# Patient Record
Sex: Female | Born: 1955 | Race: White | Hispanic: No | Smoking: Former smoker
Health system: Southern US, Community
[De-identification: ages and names within clinical notes are randomized; demographics above are authoritative.]

## PROBLEM LIST (undated history)

## (undated) DIAGNOSIS — H269 Unspecified cataract: Secondary | ICD-10-CM

## (undated) DIAGNOSIS — M1990 Unspecified osteoarthritis, unspecified site: Secondary | ICD-10-CM

## (undated) DIAGNOSIS — Z9889 Other specified postprocedural states: Secondary | ICD-10-CM

## (undated) DIAGNOSIS — K259 Gastric ulcer, unspecified as acute or chronic, without hemorrhage or perforation: Secondary | ICD-10-CM

## (undated) DIAGNOSIS — E119 Type 2 diabetes mellitus without complications: Secondary | ICD-10-CM

## (undated) DIAGNOSIS — I700 Atherosclerosis of aorta: Secondary | ICD-10-CM

## (undated) DIAGNOSIS — I10 Essential (primary) hypertension: Secondary | ICD-10-CM

## (undated) DIAGNOSIS — E785 Hyperlipidemia, unspecified: Secondary | ICD-10-CM

## (undated) DIAGNOSIS — T7840XA Allergy, unspecified, initial encounter: Secondary | ICD-10-CM

## (undated) DIAGNOSIS — Z8489 Family history of other specified conditions: Secondary | ICD-10-CM

## (undated) DIAGNOSIS — R519 Headache, unspecified: Secondary | ICD-10-CM

## (undated) DIAGNOSIS — R112 Nausea with vomiting, unspecified: Secondary | ICD-10-CM

## (undated) DIAGNOSIS — F419 Anxiety disorder, unspecified: Secondary | ICD-10-CM

## (undated) DIAGNOSIS — J189 Pneumonia, unspecified organism: Secondary | ICD-10-CM

## (undated) DIAGNOSIS — K219 Gastro-esophageal reflux disease without esophagitis: Secondary | ICD-10-CM

## (undated) DIAGNOSIS — R51 Headache: Secondary | ICD-10-CM

## (undated) HISTORY — PX: EYE SURGERY: SHX253

## (undated) HISTORY — PX: APPENDECTOMY: SHX54

## (undated) HISTORY — DX: Allergy, unspecified, initial encounter: T78.40XA

## (undated) HISTORY — DX: Hyperlipidemia, unspecified: E78.5

## (undated) HISTORY — PX: FRACTURE SURGERY: SHX138

## (undated) HISTORY — DX: Unspecified cataract: H26.9

## (undated) HISTORY — PX: OTHER SURGICAL HISTORY: SHX169

## (undated) HISTORY — PX: VESICO-VAGINAL FISTULA REPAIR: SHX5129

## (undated) HISTORY — PX: TUBAL LIGATION: SHX77

---

## 1998-09-08 ENCOUNTER — Other Ambulatory Visit: Admission: RE | Admit: 1998-09-08 | Discharge: 1998-09-08 | Payer: Self-pay | Admitting: *Deleted

## 2000-06-30 ENCOUNTER — Encounter: Payer: Self-pay | Admitting: Internal Medicine

## 2000-06-30 ENCOUNTER — Encounter: Admission: RE | Admit: 2000-06-30 | Discharge: 2000-06-30 | Payer: Self-pay | Admitting: Internal Medicine

## 2001-07-30 ENCOUNTER — Encounter: Admission: RE | Admit: 2001-07-30 | Discharge: 2001-07-30 | Payer: Self-pay | Admitting: Internal Medicine

## 2001-07-30 ENCOUNTER — Encounter: Payer: Self-pay | Admitting: Internal Medicine

## 2002-07-23 ENCOUNTER — Encounter: Admission: RE | Admit: 2002-07-23 | Discharge: 2002-07-23 | Payer: Self-pay | Admitting: Internal Medicine

## 2002-07-23 ENCOUNTER — Encounter: Payer: Self-pay | Admitting: Internal Medicine

## 2003-09-16 ENCOUNTER — Other Ambulatory Visit: Admission: RE | Admit: 2003-09-16 | Discharge: 2003-09-16 | Payer: Self-pay | Admitting: Internal Medicine

## 2003-10-21 ENCOUNTER — Encounter: Admission: RE | Admit: 2003-10-21 | Discharge: 2003-10-21 | Payer: Self-pay | Admitting: Internal Medicine

## 2004-10-19 ENCOUNTER — Other Ambulatory Visit: Admission: RE | Admit: 2004-10-19 | Discharge: 2004-10-19 | Payer: Self-pay | Admitting: Internal Medicine

## 2004-11-25 ENCOUNTER — Encounter: Admission: RE | Admit: 2004-11-25 | Discharge: 2004-11-25 | Payer: Self-pay | Admitting: Internal Medicine

## 2005-06-12 ENCOUNTER — Encounter: Admission: RE | Admit: 2005-06-12 | Discharge: 2005-06-12 | Payer: Self-pay | Admitting: Orthopedic Surgery

## 2005-08-04 ENCOUNTER — Ambulatory Visit (HOSPITAL_COMMUNITY): Admission: RE | Admit: 2005-08-04 | Discharge: 2005-08-05 | Payer: Self-pay | Admitting: Orthopedic Surgery

## 2005-12-20 ENCOUNTER — Encounter: Admission: RE | Admit: 2005-12-20 | Discharge: 2005-12-20 | Payer: Self-pay | Admitting: Internal Medicine

## 2006-07-31 ENCOUNTER — Encounter: Admission: RE | Admit: 2006-07-31 | Discharge: 2006-07-31 | Payer: Self-pay | Admitting: Internal Medicine

## 2006-08-28 ENCOUNTER — Encounter: Admission: RE | Admit: 2006-08-28 | Discharge: 2006-08-28 | Payer: Self-pay | Admitting: Internal Medicine

## 2006-11-16 ENCOUNTER — Other Ambulatory Visit: Admission: RE | Admit: 2006-11-16 | Discharge: 2006-11-16 | Payer: Self-pay | Admitting: Internal Medicine

## 2007-01-09 ENCOUNTER — Encounter: Admission: RE | Admit: 2007-01-09 | Discharge: 2007-01-09 | Payer: Self-pay | Admitting: Internal Medicine

## 2008-01-10 ENCOUNTER — Encounter: Admission: RE | Admit: 2008-01-10 | Discharge: 2008-01-10 | Payer: Self-pay | Admitting: Internal Medicine

## 2008-03-20 ENCOUNTER — Encounter: Admission: RE | Admit: 2008-03-20 | Discharge: 2008-03-20 | Payer: Self-pay | Admitting: Orthopedic Surgery

## 2008-04-23 ENCOUNTER — Ambulatory Visit (HOSPITAL_COMMUNITY): Admission: RE | Admit: 2008-04-23 | Discharge: 2008-04-24 | Payer: Self-pay | Admitting: Orthopedic Surgery

## 2008-12-18 ENCOUNTER — Other Ambulatory Visit: Admission: RE | Admit: 2008-12-18 | Discharge: 2008-12-18 | Payer: Self-pay | Admitting: Internal Medicine

## 2008-12-23 ENCOUNTER — Encounter: Admission: RE | Admit: 2008-12-23 | Discharge: 2008-12-23 | Payer: Self-pay | Admitting: Internal Medicine

## 2009-01-16 ENCOUNTER — Encounter: Admission: RE | Admit: 2009-01-16 | Discharge: 2009-01-16 | Payer: Self-pay | Admitting: Internal Medicine

## 2010-01-18 ENCOUNTER — Encounter: Admission: RE | Admit: 2010-01-18 | Discharge: 2010-01-18 | Payer: Self-pay | Admitting: Internal Medicine

## 2010-05-24 ENCOUNTER — Encounter: Payer: Self-pay | Admitting: Internal Medicine

## 2010-09-14 NOTE — Op Note (Signed)
Veronica Galvan, Veronica Galvan              ACCOUNT NO.:  [id number]   MEDICAL RECORD NO.:  [id number]          PATIENT TYPE:  OIB   LOCATION:  1529                         FACILITY:  [facility]   PHYSICIAN:  Marlowe Kays, M.D.  DATE OF BIRTH:  March 19, 1956   DATE OF PROCEDURE:  04/23/2008  DATE OF DISCHARGE:                               OPERATIVE REPORT   PREOPERATIVE DIAGNOSES:  1. Degenerative arthritis, acromioclavicular joint.  2. Chronic impingement syndrome with probable full-thickness rotator      cuff tear.   POSTOPERATIVE DIAGNOSES:  1. Degenerative arthritis, acromioclavicular joint.  2. Full-thickness rotator cuff tear, left shoulder.   OPERATION:  1. Left shoulder arthroscopy with essentially normal glenohumeral      examination.  2. Attempted arthroscopic subacromial decompression.  3. Open distal clavicle resection.  4. Open anterior acromionectomy and repair of torn rotator cuff, left      shoulder.   SURGEON:  Marlowe Kays, M.D.   ASSISTANTDruscilla Brownie. Idolina Primer, P.A.-C   ANESTHESIA:  General.   PATHOLOGY AND JUSTIFICATION FOR PROCEDURE:  She has had prior right  shoulder surgery with open distal clavicle resection and arthroscopic  subacromial decompression which has done well.  This was back in 2007.  More recently she has developed progressive pain in her left shoulder  with an MRI demonstrating considerable AC joint arthritis, and what was  felt to be most probably a full-thickness rotator cuff tear of the  distal rotator cuff.  Plan today was to arthroscope her left shoulder  with arthroscopic subacromial decompression, open distal clavicle  resection, and possible mini-open rotator cuff repair.   PROCEDURE:  Prophylactic antibiotics, satisfactory general anesthesia,  beach chair position on the [place] frame, left shoulder girdle was  prepped with DuraPrep, draped in a sterile field.  The anatomy of the  shoulder joint was marked out and time-out performed.   Because of what  the patient felt was an allergy to EPINEPHRINE, which was probably more  of a physiologic response, it was elected not to utilize adrenaline in  the Marcaine which I normally use for hemostatic purposes.  I injected  lateral and posterior portal sites subacromial space and area for the  distal clavicle excision with the 0.5% plain Marcaine.  Through a  posterior soft spot portal I atraumatically entered the glenohumeral  joint which was normal on examination.  Representative pictures were  taken.  The pictures were a little cloudy because of some mild bleeding.  I then redirected the scope into the subacromial space anterolateral  portal, introduced a 4.2 shaver.  Bleeding now became a problem, even  raising the arthroscopic inflow pressure from 65 to 80 mmHg.  Because of  inability to see the instrumentation, I finally had to abandon the  arthroscopic approach.  I then made an incision over the distal  clavicle, identifying the [facility] joint, and measuring 1.5 cm from its  lateral extent.  I then undermined the clavicle at this point and  protecting the underlying structures with this baby Hohmann, I used a  micro-saw to cut the clavicle and removed with towel clip  and cautery  technique.  Several small little spicules on the cut side of clavicle I  then removed also with a small rongeur and covered the raw bone with  bone wax.  I then extended the incision lateralward and with  subperiosteal dissection exposed the distal acromion which I undermined  with a small Cobb elevator, followed by a larger Cobb elevator.  I then  made my initial anterior acromionectomy with a micro-saw.  She had very  thick acromion and marked impingement, and I then continued the  decompression with saw, rasp and small rongeur until we had a nice wide  decompression.  I then inspected the rotator cuff and she indeed did  have a substantial full-thickness tear measuring roughly 1.5 cm in  width, with  about 0.75 cm  of retraction.  After identifying this, I  then used a 4-stranded Stryker 5.5 anchor in the lateral humerus and  wove it symmetrically through the terminal end of the rotator cuff tear,  and just  brought the torn rotator cuff back to its anatomic position.  I then made additional sutures through the terminal portion of the cuff  and the lateral humeral soft tissue __________ the bursa, supplementing  the repair and also smoothing down the terminal end.  I took pictures of  the tear and the repair.  The wound was then irrigated well with sterile  saline and I placed Gelfoam in the distal clavicle resection site and  closed the wound over the top of it with interrupted #1 Vicryl in the  small split in deltoid muscle and fascia as well as the deep  subcutaneous tissue, 2-0 Vicryl in the superficial subcutaneous tissue,  and Steri-Strips on the skin, with 4-0 nylon in the 2 portal sites.  Betadine and Adaptic was applied to them followed by dry sterile  dressing and shoulder immobilizer.  She tolerated the procedure well and  was taken to the recovery room in satisfactory condition with no known  complications.           ______________________________  Marlowe Kays, M.D.     JA/MEDQ  D:  04/23/2008  T:  04/24/2008  Job:  [id number]

## 2010-09-17 NOTE — Op Note (Signed)
NAMECAYLAN, Veronica Galvan              ACCOUNT NO.:  [id number]   MEDICAL RECORD NO.:  [id number]          PATIENT TYPE:  AMB   LOCATION:  DAY                          FACILITY:  [facility]   PHYSICIAN:  Marlowe Kays, M.D.  DATE OF BIRTH:  Mar 20, 1956   DATE OF PROCEDURE:  DATE OF DISCHARGE:                                 OPERATIVE REPORT   PREOPERATIVE DIAGNOSES:  1.  Chronic impingement syndrome with rotator cuff tendinopathy.  2.  Degenerative arthritis AC joint right shoulder.   POSTOP DIAGNOSIS:  1.  Chronic impingement syndrome with rotator cuff tendinopathy.  2.  Degenerative arthritis AC joint right shoulder.   OPERATION:  1.  Right shoulder arthroscopy (normal examination).  2.  Arthroscopic subacromial decompression with shaving of rotator cuff.  3.  Open resection distal clavicle, right shoulder.   SURGEON:  Marlowe Kays, M.D.   ASSISTANTDruscilla Brownie. Underwood, P.A.-C.   ANESTHESIA:  General.   PATHOLOGY AND JUSTIFICATION FOR PROCEDURE:  Painful right shoulder with  tenderness of the AC joint.  MRI demonstrates partial rotator cuff tear with  chronic impingement syndrome type picture.  Accordingly, she is here for  above-mentioned surgical procedure.   PROCEDURE:  Prophylactic antibiotic, satisfactory general anesthesia, beach-  chair position on the [place] frame.  Right shoulder girdle was prepped with  DuraPrep and draped sterile field.  Anatomy of shoulder joint was marked  out.  She has been very reactive to epinephrine so no epinephrine was used  throughout the case, either with local injection of Marcaine for in the  irrigation bottles.  I injected the lateral and posterior portal sites and  the incision area for the distal clavicle resection with 1/2% plain Marcaine  and after prepping the right shoulder with DuraPrep and draped in sterile  field.  Through posterior soft spot portal I atraumatically entered  glenohumeral joint which was normal on examination.   I then redirected the  scope to the subacromial space and through a lateral portal introduced a  blunt trocar followed by 4.2 shaver.  She had a very exuberant subdeltoid  bursitis which I resected.  She had also significant impingement problem.  Mr. Idolina Primer had to pull down on the arm for a good bit of the initial  procedure to allow appropriate access through the lateral portal.  I first  used the 4.2 shaver to remove a good bit of bursal followed by the 90 degree  Arthro-Care vaporizer resecting most the soft tissue from the undersurface  of acromion and then used a 4 mm oval bur to begin burring down the  undersurface of the distal acromion and went back-and-forth between the  three instruments until we had wide subacromial decompression.  The rotator  cuff was irregular on its bursal surface.  I shaved this down as well, I  smoothed this down as well.  There was no frank significant tear.  Bleeders  were coagulated before winding up this portion procedure with the Arthro-  Care vaporizer.  I then removed all fluid possible from the subacromial  space and made an open incision on the distal  clavicle and with  subperiosteal dissection exposed the [facility] joint and the distal clavicle.  I  marked out a centimeter and a half from the [facility] joint and placed some baby  Homan retractors beneath the clavicle at this point which I marked with  cautery and then used a micro saw to cut the clavicle and then remove it  with a towel clip and cutting cautery technique.  Few minor spurs on the  undersurface of the cut surface of the clavicle I removed with small  rongeur, I then covered the raw surface with bone wax. I then placed Gelfoam  in the resection site and closed the wound with interrupted #1 Vicryl in the  fascial layer over the clavicle and resection site and deep subcu tissue, 2-  0 Vicryl superficial subcu tissue, Steri-Strips on the skin and 4-0 nylon in  the two portals.  Dry sterile  dressing and shoulder immobilizer applied.  She tolerated the procedure, was taken to recovery in satisfactory condition  with no complications.           ______________________________  Marlowe Kays, M.D.     JA/MEDQ  D:  08/04/2005  T:  08/05/2005  Job:  [id number]

## 2010-12-15 ENCOUNTER — Other Ambulatory Visit: Payer: Self-pay | Admitting: Internal Medicine

## 2010-12-15 DIAGNOSIS — Z1231 Encounter for screening mammogram for malignant neoplasm of breast: Secondary | ICD-10-CM

## 2011-01-21 ENCOUNTER — Ambulatory Visit
Admission: RE | Admit: 2011-01-21 | Discharge: 2011-01-21 | Disposition: A | Discharge status: Home / Self Care | Payer: Commercial Indemnity | Source: Ambulatory Visit | Attending: Internal Medicine | Admitting: Internal Medicine

## 2011-01-21 DIAGNOSIS — Z1231 Encounter for screening mammogram for malignant neoplasm of breast: Secondary | ICD-10-CM

## 2011-02-04 LAB — COMPREHENSIVE METABOLIC PANEL
ALT: 83 U/L — ABNORMAL HIGH (ref 0–35)
AST: 53 U/L — ABNORMAL HIGH (ref 0–37)
Alkaline Phosphatase: 58 U/L (ref 39–117)
BUN: 13 mg/dL (ref 6–23)
GFR calc Af Amer: >60 mL/min (ref >60)
GFR calc non Af Amer: >60 mL/min (ref >60)
Potassium: 3.8 mEq/L (ref 3.5–5.1)
Total Bilirubin: 0.8 mg/dL (ref 0.3–1.2)
Total Protein: 6.7 g/dL (ref 6.0–8.3)

## 2011-02-04 LAB — GLUCOSE, CAPILLARY
Glucose-Capillary: 163 mg/dL — ABNORMAL HIGH (ref 70–99)
Glucose-Capillary: 236 mg/dL — ABNORMAL HIGH (ref 70–99)

## 2011-06-03 HISTORY — PX: OTHER SURGICAL HISTORY: SHX169

## 2011-12-27 ENCOUNTER — Other Ambulatory Visit: Payer: Self-pay | Admitting: Internal Medicine

## 2011-12-27 DIAGNOSIS — Z1231 Encounter for screening mammogram for malignant neoplasm of breast: Secondary | ICD-10-CM

## 2012-01-23 ENCOUNTER — Ambulatory Visit
Admission: RE | Admit: 2012-01-23 | Discharge: 2012-01-23 | Disposition: A | Discharge status: Home / Self Care | Payer: Commercial Indemnity | Source: Ambulatory Visit | Attending: Internal Medicine | Admitting: Internal Medicine

## 2012-01-23 ENCOUNTER — Ambulatory Visit: Payer: Commercial Indemnity

## 2012-01-23 DIAGNOSIS — Z1231 Encounter for screening mammogram for malignant neoplasm of breast: Secondary | ICD-10-CM

## 2012-03-26 ENCOUNTER — Other Ambulatory Visit: Payer: Self-pay | Admitting: Internal Medicine

## 2012-03-26 ENCOUNTER — Other Ambulatory Visit (HOSPITAL_COMMUNITY)
Admission: RE | Admit: 2012-03-26 | Discharge: 2012-03-26 | Disposition: A | Discharge status: Home / Self Care | Payer: Commercial Indemnity | Source: Ambulatory Visit | Attending: Internal Medicine | Admitting: Internal Medicine

## 2012-03-26 DIAGNOSIS — Z01419 Encounter for gynecological examination (general) (routine) without abnormal findings: Secondary | ICD-10-CM | POA: Insufficient documentation

## 2012-03-26 DIAGNOSIS — Z1151 Encounter for screening for human papillomavirus (HPV): Secondary | ICD-10-CM | POA: Insufficient documentation

## 2013-01-08 ENCOUNTER — Other Ambulatory Visit: Payer: Self-pay

## 2013-01-08 DIAGNOSIS — Z1231 Encounter for screening mammogram for malignant neoplasm of breast: Secondary | ICD-10-CM

## 2013-01-28 ENCOUNTER — Ambulatory Visit
Admission: RE | Admit: 2013-01-28 | Discharge: 2013-01-28 | Disposition: A | Discharge status: Home / Self Care | Payer: Commercial Indemnity | Source: Ambulatory Visit

## 2013-01-28 DIAGNOSIS — Z1231 Encounter for screening mammogram for malignant neoplasm of breast: Secondary | ICD-10-CM

## 2013-06-10 ENCOUNTER — Other Ambulatory Visit: Payer: Self-pay | Admitting: Internal Medicine

## 2013-06-10 ENCOUNTER — Ambulatory Visit
Admission: RE | Admit: 2013-06-10 | Discharge: 2013-06-10 | Disposition: A | Discharge status: Home / Self Care | Payer: Managed Care, Other (non HMO) | Source: Ambulatory Visit | Attending: Internal Medicine | Admitting: Internal Medicine

## 2013-06-10 DIAGNOSIS — S0003XA Contusion of scalp, initial encounter: Secondary | ICD-10-CM

## 2013-06-10 DIAGNOSIS — S0083XA Contusion of other part of head, initial encounter: Secondary | ICD-10-CM

## 2013-06-10 DIAGNOSIS — IMO0002 Reserved for concepts with insufficient information to code with codable children: Secondary | ICD-10-CM

## 2013-06-10 DIAGNOSIS — S1093XA Contusion of unspecified part of neck, initial encounter: Principal | ICD-10-CM

## 2013-12-23 ENCOUNTER — Other Ambulatory Visit: Payer: Self-pay

## 2013-12-23 DIAGNOSIS — Z1231 Encounter for screening mammogram for malignant neoplasm of breast: Secondary | ICD-10-CM

## 2014-02-05 ENCOUNTER — Ambulatory Visit
Admission: RE | Admit: 2014-02-05 | Discharge: 2014-02-05 | Disposition: A | Discharge status: Home / Self Care | Payer: Commercial Indemnity | Source: Ambulatory Visit

## 2014-02-05 ENCOUNTER — Encounter (INDEPENDENT_AMBULATORY_CARE_PROVIDER_SITE_OTHER): Payer: Self-pay

## 2014-02-05 DIAGNOSIS — Z1231 Encounter for screening mammogram for malignant neoplasm of breast: Secondary | ICD-10-CM

## 2014-06-23 ENCOUNTER — Other Ambulatory Visit (HOSPITAL_COMMUNITY)
Admission: RE | Admit: 2014-06-23 | Discharge: 2014-06-23 | Disposition: A | Discharge status: Home / Self Care | Payer: BLUE CROSS/BLUE SHIELD | Source: Ambulatory Visit | Attending: Obstetrics and Gynecology | Admitting: Obstetrics and Gynecology

## 2014-06-23 ENCOUNTER — Other Ambulatory Visit: Payer: Self-pay | Admitting: Obstetrics and Gynecology

## 2014-06-23 DIAGNOSIS — Z01419 Encounter for gynecological examination (general) (routine) without abnormal findings: Secondary | ICD-10-CM | POA: Diagnosis not present

## 2014-06-25 LAB — CYTOLOGY - PAP

## 2015-02-19 ENCOUNTER — Other Ambulatory Visit: Payer: Self-pay

## 2015-02-19 DIAGNOSIS — Z1231 Encounter for screening mammogram for malignant neoplasm of breast: Secondary | ICD-10-CM

## 2015-02-24 ENCOUNTER — Ambulatory Visit
Admission: RE | Admit: 2015-02-24 | Discharge: 2015-02-24 | Disposition: A | Discharge status: Home / Self Care | Payer: BLUE CROSS/BLUE SHIELD | Source: Ambulatory Visit

## 2015-02-24 DIAGNOSIS — Z1231 Encounter for screening mammogram for malignant neoplasm of breast: Secondary | ICD-10-CM

## 2015-04-02 ENCOUNTER — Other Ambulatory Visit: Payer: Self-pay | Admitting: Internal Medicine

## 2015-04-02 DIAGNOSIS — R109 Unspecified abdominal pain: Secondary | ICD-10-CM

## 2015-04-08 ENCOUNTER — Ambulatory Visit
Admission: RE | Admit: 2015-04-08 | Discharge: 2015-04-08 | Disposition: A | Discharge status: Home / Self Care | Payer: BLUE CROSS/BLUE SHIELD | Source: Ambulatory Visit | Attending: Internal Medicine | Admitting: Internal Medicine

## 2015-04-08 DIAGNOSIS — R109 Unspecified abdominal pain: Secondary | ICD-10-CM

## 2015-06-17 ENCOUNTER — Other Ambulatory Visit: Payer: Self-pay | Admitting: Obstetrics and Gynecology

## 2015-06-17 ENCOUNTER — Other Ambulatory Visit (HOSPITAL_COMMUNITY)
Admission: RE | Admit: 2015-06-17 | Discharge: 2015-06-17 | Disposition: A | Discharge status: Home / Self Care | Payer: BLUE CROSS/BLUE SHIELD | Source: Ambulatory Visit | Attending: Obstetrics and Gynecology | Admitting: Obstetrics and Gynecology

## 2015-06-17 DIAGNOSIS — Z01411 Encounter for gynecological examination (general) (routine) with abnormal findings: Secondary | ICD-10-CM | POA: Insufficient documentation

## 2015-06-17 DIAGNOSIS — Z1151 Encounter for screening for human papillomavirus (HPV): Secondary | ICD-10-CM | POA: Insufficient documentation

## 2015-06-19 LAB — CYTOLOGY - PAP

## 2016-03-07 ENCOUNTER — Other Ambulatory Visit: Payer: Self-pay | Admitting: Obstetrics and Gynecology

## 2016-03-07 DIAGNOSIS — Z1231 Encounter for screening mammogram for malignant neoplasm of breast: Secondary | ICD-10-CM

## 2016-03-21 ENCOUNTER — Ambulatory Visit
Admission: RE | Admit: 2016-03-21 | Discharge: 2016-03-21 | Disposition: A | Discharge status: Home / Self Care | Payer: BLUE CROSS/BLUE SHIELD | Source: Ambulatory Visit | Attending: Obstetrics and Gynecology | Admitting: Obstetrics and Gynecology

## 2016-03-21 DIAGNOSIS — Z1231 Encounter for screening mammogram for malignant neoplasm of breast: Secondary | ICD-10-CM

## 2016-07-01 ENCOUNTER — Other Ambulatory Visit: Payer: Self-pay | Admitting: Gastroenterology

## 2016-08-25 ENCOUNTER — Encounter (HOSPITAL_COMMUNITY): Payer: Self-pay | Admitting: *Deleted

## 2016-08-29 ENCOUNTER — Encounter (HOSPITAL_COMMUNITY): Payer: Self-pay | Admitting: Certified Registered Nurse Anesthetist

## 2016-08-29 ENCOUNTER — Ambulatory Visit (HOSPITAL_COMMUNITY)
Admission: RE | Admit: 2016-08-29 | Payer: BLUE CROSS/BLUE SHIELD | Source: Ambulatory Visit | Admitting: Gastroenterology

## 2016-08-29 HISTORY — DX: Unspecified osteoarthritis, unspecified site: M19.90

## 2016-08-29 HISTORY — DX: Headache, unspecified: R51.9

## 2016-08-29 HISTORY — DX: Anxiety disorder, unspecified: F41.9

## 2016-08-29 HISTORY — DX: Gastro-esophageal reflux disease without esophagitis: K21.9

## 2016-08-29 HISTORY — DX: Essential (primary) hypertension: I10

## 2016-08-29 HISTORY — DX: Other specified postprocedural states: Z98.890

## 2016-08-29 HISTORY — DX: Headache: R51

## 2016-08-29 HISTORY — DX: Gastric ulcer, unspecified as acute or chronic, without hemorrhage or perforation: K25.9

## 2016-08-29 HISTORY — DX: Family history of other specified conditions: Z84.89

## 2016-08-29 HISTORY — DX: Type 2 diabetes mellitus without complications: E11.9

## 2016-08-29 HISTORY — DX: Nausea with vomiting, unspecified: R11.2

## 2016-08-29 SURGERY — COLONOSCOPY WITH PROPOFOL
Anesthesia: Monitor Anesthesia Care

## 2016-08-30 ENCOUNTER — Other Ambulatory Visit: Payer: Self-pay | Admitting: Gastroenterology

## 2016-09-07 ENCOUNTER — Ambulatory Visit (HOSPITAL_COMMUNITY): Payer: BLUE CROSS/BLUE SHIELD | Admitting: Anesthesiology

## 2016-09-07 ENCOUNTER — Encounter (HOSPITAL_COMMUNITY): Payer: Self-pay | Admitting: *Deleted

## 2016-09-07 ENCOUNTER — Ambulatory Visit (HOSPITAL_COMMUNITY)
Admission: RE | Admit: 2016-09-07 | Discharge: 2016-09-07 | Disposition: A | Discharge status: Home / Self Care | Payer: BLUE CROSS/BLUE SHIELD | Source: Ambulatory Visit | Attending: Gastroenterology | Admitting: Gastroenterology

## 2016-09-07 ENCOUNTER — Encounter (HOSPITAL_COMMUNITY)
Admission: RE | Disposition: A | Discharge status: Home / Self Care | Payer: Self-pay | Source: Ambulatory Visit | Attending: Gastroenterology

## 2016-09-07 DIAGNOSIS — Z885 Allergy status to narcotic agent status: Secondary | ICD-10-CM | POA: Diagnosis not present

## 2016-09-07 DIAGNOSIS — I10 Essential (primary) hypertension: Secondary | ICD-10-CM | POA: Diagnosis not present

## 2016-09-07 DIAGNOSIS — D128 Benign neoplasm of rectum: Secondary | ICD-10-CM | POA: Diagnosis not present

## 2016-09-07 DIAGNOSIS — Z86010 Personal history of colonic polyps: Secondary | ICD-10-CM | POA: Diagnosis not present

## 2016-09-07 DIAGNOSIS — K279 Peptic ulcer, site unspecified, unspecified as acute or chronic, without hemorrhage or perforation: Secondary | ICD-10-CM | POA: Insufficient documentation

## 2016-09-07 DIAGNOSIS — M1990 Unspecified osteoarthritis, unspecified site: Secondary | ICD-10-CM | POA: Insufficient documentation

## 2016-09-07 DIAGNOSIS — E119 Type 2 diabetes mellitus without complications: Secondary | ICD-10-CM | POA: Diagnosis not present

## 2016-09-07 DIAGNOSIS — K219 Gastro-esophageal reflux disease without esophagitis: Secondary | ICD-10-CM | POA: Insufficient documentation

## 2016-09-07 DIAGNOSIS — H8110 Benign paroxysmal vertigo, unspecified ear: Secondary | ICD-10-CM | POA: Insufficient documentation

## 2016-09-07 DIAGNOSIS — Z880 Allergy status to penicillin: Secondary | ICD-10-CM | POA: Diagnosis not present

## 2016-09-07 DIAGNOSIS — Z1211 Encounter for screening for malignant neoplasm of colon: Secondary | ICD-10-CM | POA: Insufficient documentation

## 2016-09-07 HISTORY — PX: COLONOSCOPY WITH PROPOFOL: SHX5780

## 2016-09-07 LAB — GLUCOSE, CAPILLARY: Glucose-Capillary: 141 mg/dL — ABNORMAL HIGH (ref 65–99)

## 2016-09-07 LAB — HM COLONOSCOPY

## 2016-09-07 SURGERY — COLONOSCOPY WITH PROPOFOL
Anesthesia: Monitor Anesthesia Care

## 2016-09-07 MED ORDER — LACTATED RINGERS IV SOLN
INTRAVENOUS | Status: DC
Start: 2016-09-07 — End: 2016-09-07
  Administered 2016-09-07: 1000 mL via INTRAVENOUS

## 2016-09-07 MED ORDER — PROPOFOL 500 MG/50ML IV EMUL
INTRAVENOUS | Status: DC | PRN
  Administered 2016-09-07: 140 ug/kg/min via INTRAVENOUS

## 2016-09-07 MED ORDER — LIDOCAINE 2% (20 MG/ML) 5 ML SYRINGE
INTRAMUSCULAR | Status: DC | PRN
  Administered 2016-09-07: 100 mg via INTRAVENOUS

## 2016-09-07 MED ORDER — ONDANSETRON HCL 4 MG/2ML IJ SOLN
INTRAMUSCULAR | Status: DC | PRN
  Administered 2016-09-07: 4 mg via INTRAVENOUS

## 2016-09-07 MED ORDER — ONDANSETRON HCL 4 MG/2ML IJ SOLN
INTRAMUSCULAR | Status: AC
  Filled 2016-09-07: qty 2

## 2016-09-07 MED ORDER — PROPOFOL 10 MG/ML IV BOLUS
INTRAVENOUS | Status: AC
  Filled 2016-09-07: qty 60

## 2016-09-07 MED ORDER — SODIUM CHLORIDE 0.9 % IV SOLN: INTRAVENOUS | Status: DC

## 2016-09-07 MED ORDER — PROPOFOL 10 MG/ML IV BOLUS
INTRAVENOUS | Status: DC | PRN
  Administered 2016-09-07 (×4): 20 mg via INTRAVENOUS

## 2016-09-07 SURGICAL SUPPLY — 22 items

## 2016-09-07 NOTE — Anesthesia Preprocedure Evaluation (Addendum)
Anesthesia Evaluation  Patient identified by MRN, date of birth, ID band Patient awake  General Assessment Comment:Son has hx of MH  History of Anesthesia Complications (+) PONV and DIFFICULT IV STICK / SPECIAL LINE  Airway Mallampati: II       Dental  (+) Teeth Intact   Pulmonary former smoker,    breath sounds clear to auscultation       Cardiovascular hypertension,  Rhythm:Regular Rate:Normal     Neuro/Psych    GI/Hepatic PUD, GERD  ,  Endo/Other  diabetes  Renal/GU      Musculoskeletal  (+) Arthritis ,   Abdominal   Peds  Hematology   Anesthesia Other Findings   Reproductive/Obstetrics                            Anesthesia Physical Anesthesia Plan  ASA: III  Anesthesia Plan: MAC   Post-op Pain Management:    Induction: Intravenous  Airway Management Planned: Natural Airway and Simple Face Mask  Additional Equipment:   Intra-op Plan:   Post-operative Plan: Extubation in OR  Informed Consent: I have reviewed the patients History and Physical, chart, labs and discussed the procedure including the risks, benefits and alternatives for the proposed anesthesia with the patient or authorized representative who has indicated his/her understanding and acceptance.     Plan Discussed with:   Anesthesia Plan Comments:        Anesthesia Quick Evaluation

## 2016-09-07 NOTE — Op Note (Signed)
[facility] Patient Name: Veronica Galvan Procedure Date: 09/07/2016 MRN: [id number] Attending MD: Garlan Fair , MD Date of Birth: 23-Sep-1955 CSN: [id number] Age: 61 Admit Type: Outpatient Procedure:                Colonoscopy Indications:              High risk colon cancer surveillance: Personal                            history of non-advanced adenoma Providers:                Garlan Fair, MD, Lillie Fragmin, RN, Burtis Junes, RN, Cherylynn Ridges, Technician, Alan Mulder,                            Technician, Danley Danker, CRNA Referring MD:              Medicines:                Propofol per Anesthesia Complications:            No immediate complications. Estimated Blood Loss:     Estimated blood loss was minimal. Procedure:                Pre-Anesthesia Assessment:                           - Prior to the procedure, a History and Physical                            was performed, and patient medications and                            allergies were reviewed. The patient's tolerance of                            previous anesthesia was also reviewed. The risks                            and benefits of the procedure and the sedation                            options and risks were discussed with the patient.                            All questions were answered, and informed consent                            was obtained. Prior Anticoagulants: The patient has                            taken aspirin, last dose was 1 day prior to  procedure. ASA Grade Assessment: II - A patient                            with mild systemic disease. After reviewing the                            risks and benefits, the patient was deemed in                            satisfactory condition to undergo the procedure.                           After obtaining informed consent, the colonoscope   was passed under direct vision. Throughout the                            procedure, the patient's blood pressure, pulse, and                            oxygen saturations were monitored continuously. The                            EC-[id number] ([id number]) scope was introduced through                            the anus and advanced to the the cecum, identified                            by appendiceal orifice and ileocecal valve. The                            colonoscopy was performed without difficulty. The                            patient tolerated the procedure well. The quality                            of the bowel preparation was good. The ileocecal                            valve, the appendiceal orifice and the rectum were                            photographed. Scope In: 10:56:10 AM Scope Out: 11:11:46 AM Scope Withdrawal Time: 0 hours 11 minutes 15 seconds  Total Procedure Duration: 0 hours 15 minutes 36 seconds  Findings:      The perianal and digital rectal examinations were normal.      A 5 mm polyp was found in the rectum. The polyp was sessile. The polyp       was removed with a cold snare. Resection and retrieval were complete.      The exam was otherwise without abnormality. Impression:               - One 5 mm polyp in the rectum, removed  with a cold                            snare. Resected and retrieved.                           - The examination was otherwise normal. Moderate Sedation:      N/A- Per Anesthesia Care Recommendation:           - Patient has a contact number available for                            emergencies. The signs and symptoms of potential                            delayed complications were discussed with the                            patient. Return to normal activities tomorrow.                            Written discharge instructions were provided to the                            patient.                           - Repeat  colonoscopy date to be determined after                            pending pathology results are reviewed for                            surveillance.                           - Resume previous diet.                           - Continue present medications. Procedure Code(s):        --- Professional ---                           [phone], Colonoscopy, flexible; with removal of                            tumor(s), polyp(s), or other lesion(s) by snare                            technique Diagnosis Code(s):        --- Professional ---                           Z86.010, Personal history of colonic polyps                           K62.1, Rectal polyp CPT copyright 2016 [employer]. All  rights reserved. The codes documented in this report are preliminary and upon coder review may  be revised to meet current compliance requirements. Earle Gell, MD Garlan Fair, MD 09/07/2016 11:17:28 AM This report has been signed electronically. Number of Addenda: 0

## 2016-09-07 NOTE — H&P (Signed)
Procedure: Surveillance colonoscopy. 07/04/2011 colonoscopy was performed with removal of two small tubular adenomatous colon polyps  History: The patient is a 61 year old female born [birth year]. She is scheduled to undergo a surveillance colonoscopy today.  Past medical history: Bilateral rotator cuff surgery. Cesarean section. Rectovaginal fistula repair. Type 2 diabetes mellitus. Hypertension. Fatty appearing liver by ultrasound performed in 2008. Elbow and wrist fracture. Benign paroxysmal positional vertigo.  Medication allergies: Penicillin and codeine  Exam: The patient is alert and lying comfortably on the endoscopy stretcher. Abdomen is soft and nontender to palpation. Lungs are clear to auscultation. Cardiac exam reveals a regular rhythm.  Plan: Proceed with surveillance colonoscopy

## 2016-09-07 NOTE — Discharge Instructions (Signed)

## 2016-09-07 NOTE — Transfer of Care (Signed)
Immediate Anesthesia Transfer of Care Note  Patient: Veronica Galvan  Procedure(s) Performed: Procedure(s): COLONOSCOPY WITH PROPOFOL (N/A)  Patient Location: Endoscopy Unit  Anesthesia Type:MAC  Level of Consciousness: awake, alert  and oriented  Airway & Oxygen Therapy: Patient Spontanous Breathing  Post-op Assessment: Report given to RN and Post -op Vital signs reviewed and stable  Post vital signs: Reviewed and stable  Last Vitals:  Vitals:   09/07/16 0926  BP: (!) 177/80  Pulse: (!) 105  Resp: 15  Temp: 37 C    Last Pain:  Vitals:   09/07/16 0926  TempSrc: Oral         Complications: No apparent anesthesia complications

## 2016-09-07 NOTE — Anesthesia Postprocedure Evaluation (Signed)
Anesthesia Post Note  Patient: Veronica Galvan  Procedure(s) Performed: Procedure(s) (LRB): COLONOSCOPY WITH PROPOFOL (N/A)  Patient location during evaluation: PACU Anesthesia Type: MAC Level of consciousness: awake and alert Pain management: pain level controlled Vital Signs Assessment: post-procedure vital signs reviewed and stable Respiratory status: spontaneous breathing, nonlabored ventilation and respiratory function stable Cardiovascular status: stable and blood pressure returned to baseline Anesthetic complications: no       Last Vitals:  Vitals:   09/07/16 1130 09/07/16 1145  BP: 105/66 117/84  Pulse: 88   Resp: 19 18  Temp:      Last Pain:  Vitals:   09/07/16 1119  TempSrc: Oral                 Lynda Rainwater

## 2016-09-08 ENCOUNTER — Encounter (HOSPITAL_COMMUNITY): Payer: Self-pay | Admitting: Gastroenterology

## 2016-11-15 ENCOUNTER — Encounter: Payer: Self-pay | Admitting: Podiatry

## 2016-11-15 ENCOUNTER — Ambulatory Visit (INDEPENDENT_AMBULATORY_CARE_PROVIDER_SITE_OTHER): Payer: BLUE CROSS/BLUE SHIELD | Admitting: Podiatry

## 2016-11-15 DIAGNOSIS — M792 Neuralgia and neuritis, unspecified: Secondary | ICD-10-CM | POA: Diagnosis not present

## 2016-11-15 DIAGNOSIS — M779 Enthesopathy, unspecified: Secondary | ICD-10-CM | POA: Diagnosis not present

## 2016-11-15 DIAGNOSIS — D3610 Benign neoplasm of peripheral nerves and autonomic nervous system, unspecified: Secondary | ICD-10-CM

## 2016-11-15 DIAGNOSIS — M898X9 Other specified disorders of bone, unspecified site: Secondary | ICD-10-CM | POA: Diagnosis not present

## 2016-11-15 NOTE — Progress Notes (Signed)
Subjective:    Patient ID: Veronica Galvan, female   DOB: 61 y.o.   MRN: [id number]   HPI Veronica Galvan Presents the office today for concerns of pain to her left foot on the top of the foot which has been ongoing for about 1 month. She states that when she was wearing her water shoes other tennis shoes that is rubbing the top of her foot and cause sharp pain so into her big toe. She points the dorsal medial aspect of the foot which is the majority of symptoms. She denies any recent injury or trauma she's had no recent treatment for this. States she has secondary concerns of numbness or tingling to the third and fourth toes and left foot as well but his been ongoing for quite some time the area does not hurt she distended systems symptoms to this area intermittently. No recent injury or trauma to this area she denies any swelling or redness to her foot. She has no other concerns today.   She does that she has an upcoming appointment a rheumatologist due to swelling in her hands and joint pain overall.      Review of Systems  All other systems reviewed and are negative.       Objective:  Physical Exam General: AAO x3, NAD  Dermatological: Skin is warm, dry and supple bilateral. Nails x 10 are well manicured; remaining integument appears unremarkable at this time. There are no open sores, no preulcerative lesions, no rash or signs of infection present.  Vascular: Dorsalis Pedis artery and Posterior Tibial artery pedal pulses are 2/4 bilateral with immedate capillary fill time. There is no pain with calf compression, swelling, warmth, erythema.   Neruologic: Sensation intact with Derrel Nip monofilament. Subjective there is neuritis symptoms along the course of the saphenous nerve on the left foot.   Musculoskeletal: Small bony exostosis is palpable to the dorsal medial aspect of the foot approximately first metatarsal cuneiform joint. There is mild localized edema in this area is no  erythema or increase in warmth. There is tenderness to palpation directly on this area. There is no other area pinpoint bony tenderness there is no pain vibratory sensation. Upon palpation of this area there subjective shooting pain into the hallux. There is no palpable neuroma identified at this time but there are some minimal discomfort of the third interspace and the left foot with subjective numbness and tingling to the third and fourth toes. She states the area does not bother her except for she presents at. She does want to have the area checked why she is here for these were secondary concerns. Equinus is present. Mild recurrence of posterior heel spur the left heel. No pain to the area there is no erythema or edema.  Gait: Unassisted, Nonantalgic.      Assessment:     Bony exostosis with neuritis symptoms left foot    Plan:  -Treatment options discussed including all alternatives, risks, and complications -Etiology of symptoms were discussed -Today steroid injection with a makeshift Kenalog and local anesthetic was infiltrated into the area of maximal tenderness the dorsal medial aspect of the left foot without any complications. Post injection care was discussed. Discussed that she can modifications and to eliminate any pressure to this area. Ice to the area and limit activity the next couple of days. If the area is not resolved within the next 3 weeks to call the office for follow-up otherwise will continue to watch this. Discussed that she  can modifications and inserts. 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 She'll continue to monitor as this is not symptomatic for her today. -Stretching exercises for equinus.    Celesta Gentile, DPM

## 2016-11-15 NOTE — Progress Notes (Signed)
   Subjective:    Patient ID: Veronica Galvan, female    DOB: 07/13/1955, 61 y.o.   MRN: [id number]  HPI   I have some left foot pain and has been going on for about a month and hurts on the top and it has some numbness in my toes and I am a diabetic and there is some tenderness and sorness    Review of Systems  All other systems reviewed and are negative.      Objective:   Physical Exam        Assessment & Plan:

## 2017-02-13 ENCOUNTER — Other Ambulatory Visit: Payer: Self-pay | Admitting: Obstetrics and Gynecology

## 2017-02-13 DIAGNOSIS — Z1231 Encounter for screening mammogram for malignant neoplasm of breast: Secondary | ICD-10-CM

## 2017-03-30 ENCOUNTER — Ambulatory Visit: Payer: BLUE CROSS/BLUE SHIELD

## 2017-10-24 DIAGNOSIS — W57XXXA Bitten or stung by nonvenomous insect and other nonvenomous arthropods, initial encounter: Secondary | ICD-10-CM | POA: Insufficient documentation

## 2017-10-26 ENCOUNTER — Encounter: Payer: Self-pay | Admitting: Podiatry

## 2017-10-26 ENCOUNTER — Ambulatory Visit: Payer: BLUE CROSS/BLUE SHIELD | Admitting: Podiatry

## 2017-10-26 DIAGNOSIS — M792 Neuralgia and neuritis, unspecified: Secondary | ICD-10-CM

## 2017-10-26 DIAGNOSIS — M898X9 Other specified disorders of bone, unspecified site: Secondary | ICD-10-CM

## 2017-10-26 DIAGNOSIS — M7752 Other enthesopathy of left foot: Secondary | ICD-10-CM | POA: Diagnosis not present

## 2017-10-26 DIAGNOSIS — M779 Enthesopathy, unspecified: Secondary | ICD-10-CM

## 2017-10-28 NOTE — Progress Notes (Signed)
Subjective: 62 year old female presents the office today requesting an injection to help with the left foot.  Is been about a year since I last gave her an injection and she is done well for the last couple of months.  She is requesting another injection today.  She states she will occasionally get some numbness into the middle 3 toes on the left foot but no other areas of the foot or the contralateral extremity.  Denies any recent injury or changes since I last saw her. Denies any systemic complaints such as fevers, chills, nausea, vomiting. No acute changes since last appointment, and no other complaints at this time.   Objective: AAO x3, NAD DP/PT pulses palpable bilaterally, CRT less than 3 seconds Dorsal bony prominence is present of the dorsal medial aspect of her foot there is tenderness palpation of the area.  There is very minimal edema there is no erythema or increase in warmth.  Extensor tendons appear to be intact.  There is no other areas of tenderness identified at this time.  No erythema or increase in warmth.  Overall her sensation is intact with Derrel Nip monofilament but subjectively she gets some numbness to the second, third, fourth toe on the left foot.  There is no palpable neuroma identified at this time. No open lesions or pre-ulcerative lesions.  No pain with calf compression, swelling, warmth, erythema  Assessment: 62 year old female with capsulitis, bony exostosis with neuritis symptoms.  Plan: -All treatment options discussed with the patient including all alternatives, risks, complications.  -She is concerned that the numbness is coming from neuropathy.  I doubt this is a neuropathy given the localized nature and is unilateral.  We will continue to monitor.  She does have some bony prominence on the midfoot and this could be causing some compression of the nerve.  She was to proceed with a steroid injection today.  Skin was prepped with alcohol a mixture of 1 cc  Kenalog 10, 0.5 cc of Marcaine plain 0.5 cc of lidocaine plain was infiltrated into and around the maximal tenderness to the area on the dorsal medial midfoot.  Postinjection care was discussed.  Monitor for infection.  Offloading. -Patient encouraged to call the office with any questions, concerns, change in symptoms.   Trula Slade DPM

## 2017-12-07 ENCOUNTER — Ambulatory Visit: Payer: BLUE CROSS/BLUE SHIELD | Admitting: Podiatry

## 2017-12-07 ENCOUNTER — Ambulatory Visit (INDEPENDENT_AMBULATORY_CARE_PROVIDER_SITE_OTHER): Payer: BLUE CROSS/BLUE SHIELD

## 2017-12-07 DIAGNOSIS — M722 Plantar fascial fibromatosis: Secondary | ICD-10-CM

## 2017-12-07 MED ORDER — TRIAMCINOLONE ACETONIDE 10 MG/ML IJ SUSP
10.0000 mg | Freq: Once | INTRAMUSCULAR | Status: AC
  Administered 2017-12-07: 10 mg

## 2017-12-07 NOTE — Patient Instructions (Signed)

## 2017-12-11 NOTE — Progress Notes (Signed)
Subjective: 62 year old female presents the office if new concerns of pain from the right heel which started on Friday or Saturday.  She states that hurts in the bottom of her foot she denies any recent injury or trauma to the area.  No significant swelling no redness.  No numbness or tingling.  The pain does not wake her up at night.  No significant treatment. Denies any systemic complaints such as fevers, chills, nausea, vomiting. No acute changes since last appointment, and no other complaints at this time.   Objective: AAO x3, NAD DP/PT pulses palpable bilaterally, CRT less than 3 seconds Tenderness to palpation along the plantar medial tubercle of the calcaneus at the insertion of plantar fascia on the right foot. There is no pain along the course of the plantar fascia within the arch of the foot. Plantar fascia appears to be intact. There is no pain with lateral compression of the calcaneus or pain with vibratory sensation. There is no pain along the course or insertion of the achilles tendon. No other areas of tenderness to bilateral lower extremities.  Negative Tinel sign No open lesions or pre-ulcerative lesions.  No pain with calf compression, swelling, warmth, erythema  Assessment: Right heel pain, plantar fasciitis  Plan: -All treatment options discussed with the patient including all alternatives, risks, complications.  -X-rays obtained and reviewed.  No evidence of acute fracture. -Steroid injection performed.  See procedure note below. -Plantar fascial brace dispensed -Discussed stretching, icing daily. -Patient encouraged to call the office with any questions, concerns, change in symptoms.   Procedure: Injection Tendon/Ligament Discussed alternatives, risks, complications and verbal consent was obtained.  Location: Right plantar fascia at the glabrous junction; medial approach. Skin Prep: Alcohol. Injectate: 0.5cc 0.5% marcaine plain, 0.5 cc 2% lidocaine plain and, 1 cc  kenalog 10. Disposition: Patient tolerated procedure well. Injection site dressed with a band-aid.  Post-injection care was discussed and return precautions discussed.   Trula Slade DPM

## 2018-01-04 ENCOUNTER — Encounter: Payer: Self-pay | Admitting: Podiatry

## 2018-01-04 ENCOUNTER — Ambulatory Visit: Payer: BLUE CROSS/BLUE SHIELD | Admitting: Podiatry

## 2018-01-04 DIAGNOSIS — M722 Plantar fascial fibromatosis: Secondary | ICD-10-CM | POA: Diagnosis not present

## 2018-01-04 MED ORDER — TRIAMCINOLONE ACETONIDE 10 MG/ML IJ SUSP
10.0000 mg | Freq: Once | INTRAMUSCULAR | Status: AC
  Administered 2018-01-04: 10 mg

## 2018-01-08 NOTE — Progress Notes (Signed)
Subjective: 62 year old female presents the office today for evaluation of right heel pain, plantar fasciitis.  She states that overall she is doing better but her pain has not completely gone away.  She is requesting steroid injection into her foot.  She has been doing stretching icing much as possible.  She has no other concerns. Denies any systemic complaints such as fevers, chills, nausea, vomiting. No acute changes since last appointment, and no other complaints at this time.   Objective: AAO x3, NAD DP/PT pulses palpable bilaterally, CRT less than 3 seconds There is improvement neck and is tenderness palpation on the plantar medial tubercle of the calcaneus and insertion of plantar fashion.  Plantar fascia, Achilles tendon appear to be intact.  No pain with lateral compression.  No other areas of tenderness.  No open lesions or pre-ulcerative lesions.  No pain with calf compression, swelling, warmth, erythema  Assessment: Right plantar fascial  Plan: -All treatment options discussed with the patient including all alternatives, risks, complications.  -Steroid injection was performed today.  See procedure note below.  Continue stretching, icing exercises daily.  Continue wearing supportive shoes. -Patient encouraged to call the office with any questions, concerns, change in symptoms.   Procedure: Injection Tendon/Ligament Discussed alternatives, risks, complications and verbal consent was obtained.  Location: Right plantar fascia at the glabrous junction; medial approach. Skin Prep: Alcohol. Injectate: 0.5cc 0.5% marcaine plain, 0.5 cc 2% lidocaine plain and, 1 cc kenalog 10. Disposition: Patient tolerated procedure well. Injection site dressed with a band-aid.  Post-injection care was discussed and return precautions discussed.   Return in about 4 weeks (around 02/01/2018).  Trula Slade DPM

## 2018-01-29 DIAGNOSIS — M542 Cervicalgia: Secondary | ICD-10-CM | POA: Insufficient documentation

## 2018-02-01 ENCOUNTER — Encounter: Payer: Self-pay | Admitting: Podiatry

## 2018-02-01 ENCOUNTER — Ambulatory Visit: Payer: BLUE CROSS/BLUE SHIELD | Admitting: Podiatry

## 2018-02-01 DIAGNOSIS — M779 Enthesopathy, unspecified: Secondary | ICD-10-CM

## 2018-02-01 DIAGNOSIS — M722 Plantar fascial fibromatosis: Secondary | ICD-10-CM | POA: Diagnosis not present

## 2018-02-01 NOTE — Progress Notes (Signed)
Subjective: 62 year old female presents the office today for follow-up evaluation of bilateral foot pain.  She said that her right heel pain is much better.  She thinks that she is to get a new pair of sandals as she wears [place] all the time.  She gets some occasional discomfort in the arch of her foot but overall she is doing much better.  She has been some pain to the top of the left foot she is asking for steroid injection on that side of April.  No recent injury or trauma to her feet.  No swelling.  She has no other concerns today. Denies any systemic complaints such as fevers, chills, nausea, vomiting. No acute changes since last appointment, and no other complaints at this time.   Objective: AAO x3, NAD DP/PT pulses palpable bilaterally, CRT less than 3 seconds There is mild tenderness to palpation on the medial band plantar fashion the arch of the from the right side but overall upon the fascia appears to be intact.  Achilles tendon appears to be intact.  There is no significant edema, erythema bilaterally.  Tenderness palpation of the dorsal aspect of the midfoot on the area with palpable bone spur.  There is no edema, erythema there is no area pinpoint tenderness.  She cannot wear closed in shoes because of previous heel surgery on both of her heels. No open lesions or pre-ulcerative lesions.  No pain with calf compression, swelling, warmth, erythema  Assessment: Resolving right plantar fasciitis with dorsal left midfoot pain  Plan: -All treatment options discussed with the patient including all alternatives, risks, complications.  -Regards to the right foot I want her to continue with wearing supportive shoes.  She is to get new sandals with good arch supports today.  Continue to ice the area as well daily as well as stretching exercises.  In regard to the left foot we discussed a steroid injection she wishes to proceed.  The area was prepped with alcohol and a mixture of 1 cc Kenalog 10,  0.5 cc of 0.5% Marcaine plain 0.5 cc of 2% lidocaine plain.  She tolerated well any complications.  Postinjection care discussed -Patient encouraged to call the office with any questions, concerns, change in symptoms.   Trula Slade DPM

## 2018-09-03 ENCOUNTER — Ambulatory Visit: Payer: BLUE CROSS/BLUE SHIELD | Admitting: Podiatry

## 2018-09-03 ENCOUNTER — Other Ambulatory Visit: Payer: Self-pay

## 2018-09-03 ENCOUNTER — Encounter: Payer: Self-pay | Admitting: Podiatry

## 2018-09-03 VITALS — Temp 97.9°F

## 2018-09-03 DIAGNOSIS — L600 Ingrowing nail: Secondary | ICD-10-CM

## 2018-09-03 NOTE — Patient Instructions (Signed)

## 2018-09-05 DIAGNOSIS — L600 Ingrowing nail: Secondary | ICD-10-CM | POA: Insufficient documentation

## 2018-09-05 NOTE — Progress Notes (Signed)
Subjective: 63 year old female presents the office today for concerns of ingrown toenail left big toe, medial aspect.  She states that because she has not been able to get a pedicure she tried trimming herself and she broke a piece of nail off on the corner and since then caused a lot of pain.  Denies any redness or drainage or any swelling. Denies any systemic complaints such as fevers, chills, nausea, vomiting. No acute changes since last appointment, and no other complaints at this time.   Objective: AAO x3, NAD DP/PT pulses palpable bilaterally, CRT less than 3 seconds Patient presents on the medial aspect left hallux toenail with tenderness palpation.  There is localized edema to the area there is no erythema or warmth. No ascending cellulitis.  No fluctuation crepitation malodor.  No open lesions or pre-ulcerative lesions.  No pain with calf compression, swelling, warmth, erythema  Assessment: Ingrown toenail left medial hallux  Plan: -All treatment options discussed with the patient including all alternatives, risks, complications.  -At this time, the patient is requesting partial nail removal with chemical matricectomy to the symptomatic portion of the nail. Risks and complications were discussed with the patient for which they understand and written consent was obtained. Under sterile conditions a total of 3 mL of a mixture of 2% lidocaine plain and 0.5% Marcaine plain was infiltrated in a hallux block fashion. Once anesthetized, the skin was prepped in sterile fashion. A tourniquet was then applied. Next the medial aspect of hallux nail border was then sharply excised making sure to remove the entire offending nail border. Once the nails were ensured to be removed area was debrided and the underlying skin was intact. There is no purulence identified in the procedure. Next phenol was then applied under standard conditions and copiously irrigated. Silvadene was applied. A dry sterile dressing  was applied. After application of the dressing the tourniquet was removed and there is found to be an immediate capillary refill time to the digit. The patient tolerated the procedure well any complications. Post procedure instructions were discussed the patient for which he verbally understood. Follow-up in one week for nail check or sooner if any problems are to arise. Discussed signs/symptoms of infection and directed to call the office immediately should any occur or go directly to the emergency room. In the meantime, encouraged to call the office with any questions, concerns, changes symptoms. -Patient encouraged to call the office with any questions, concerns, change in symptoms.   Trula Slade DPM

## 2018-09-14 ENCOUNTER — Other Ambulatory Visit: Payer: Self-pay

## 2018-09-14 ENCOUNTER — Encounter: Payer: Self-pay | Admitting: Podiatry

## 2018-09-14 ENCOUNTER — Ambulatory Visit: Payer: BLUE CROSS/BLUE SHIELD | Admitting: Podiatry

## 2018-09-14 VITALS — Temp 97.5°F

## 2018-09-14 DIAGNOSIS — L600 Ingrowing nail: Secondary | ICD-10-CM

## 2018-09-14 NOTE — Patient Instructions (Signed)

## 2018-09-14 NOTE — Progress Notes (Signed)
Subjective: Veronica Galvan is a 63 y.o.  female returns to office today for follow up evaluation after having left Hallux partial nail avulsion performed. Patient has been soaking using epsom salts and applying topical antibiotic covered with bandaid daily. Patient denies fevers, chills, nausea, vomiting. Denies any calf pain, chest pain, SOB.   Objective:  Vitals: Reviewed  General: Well developed, nourished, in no acute distress, alert and oriented x3   Dermatology: Skin is warm, dry and supple bilateral. Left hallux nail border appears to be clean, dry, with mild granular tissue and surrounding scab. There is no surrounding erythema, edema, drainage/purulence. The remaining nails appear unremarkable at this time. There are no other lesions or other signs of infection present.  Neurovascular status: Intact. No lower extremity swelling; No pain with calf compression bilateral.  Musculoskeletal: Minimal tenderness to palpation of the left hallux nail fold.  Muscular strength within normal limits bilateral.   Assesement and Plan: S/p partial nail avulsion, doing well.   -Continue soaking in epsom salts twice a day followed by antibiotic ointment and a band-aid. Can leave uncovered at night. Continue this until completely healed.  -If the area has not healed in 2 weeks, call the office for follow-up appointment, or sooner if any problems arise.  -Monitor for any signs/symptoms of infection. Call the office immediately if any occur or go directly to the emergency room. Call with any questions/concerns.  Celesta Gentile, DPM

## 2019-11-25 ENCOUNTER — Encounter: Payer: Self-pay | Admitting: Podiatry

## 2019-11-25 ENCOUNTER — Ambulatory Visit: Payer: BC Managed Care – PPO | Admitting: Podiatry

## 2019-11-25 ENCOUNTER — Ambulatory Visit (INDEPENDENT_AMBULATORY_CARE_PROVIDER_SITE_OTHER): Payer: BC Managed Care – PPO

## 2019-11-25 ENCOUNTER — Other Ambulatory Visit: Payer: Self-pay

## 2019-11-25 DIAGNOSIS — M779 Enthesopathy, unspecified: Secondary | ICD-10-CM | POA: Diagnosis not present

## 2019-11-25 DIAGNOSIS — M7752 Other enthesopathy of left foot: Secondary | ICD-10-CM | POA: Diagnosis not present

## 2019-11-25 DIAGNOSIS — E119 Type 2 diabetes mellitus without complications: Secondary | ICD-10-CM | POA: Diagnosis not present

## 2019-11-25 NOTE — Patient Instructions (Signed)
Diabetes Mellitus and Foot Care Foot care is an important part of your health, especially when you have diabetes. Diabetes may cause you to have problems because of poor blood flow (circulation) to your feet and legs, which can cause your skin to:  Become thinner and drier.  Break more easily.  Heal more slowly.  Peel and crack. You may also have nerve damage (neuropathy) in your legs and feet, causing decreased feeling in them. This means that you may not notice minor injuries to your feet that could lead to more serious problems. Noticing and addressing any potential problems early is the best way to prevent future foot problems. How to care for your feet Foot hygiene  Wash your feet daily with warm water and mild soap. Do not use hot water. Then, pat your feet and the areas between your toes until they are completely dry. Do not soak your feet as this can dry your skin.  Trim your toenails straight across. Do not dig under them or around the cuticle. File the edges of your nails with an emery board or nail file.  Apply a moisturizing lotion or petroleum jelly to the skin on your feet and to dry, brittle toenails. Use lotion that does not contain alcohol and is unscented. Do not apply lotion between your toes. Shoes and socks  Wear clean socks or stockings every day. Make sure they are not too tight. Do not wear knee-high stockings since they may decrease blood flow to your legs.  Wear shoes that fit properly and have enough cushioning. Always look in your shoes before you put them on to be sure there are no objects inside.  To break in new shoes, wear them for just a few hours a day. This prevents injuries on your feet. Wounds, scrapes, corns, and calluses  Check your feet daily for blisters, cuts, bruises, sores, and redness. If you cannot see the bottom of your feet, use a mirror or ask someone for help.  Do not cut corns or calluses or try to remove them with medicine.  If you  find a minor scrape, cut, or break in the skin on your feet, keep it and the skin around it clean and dry. You may clean these areas with mild soap and water. Do not clean the area with peroxide, alcohol, or iodine.  If you have a wound, scrape, corn, or callus on your foot, look at it several times a day to make sure it is healing and not infected. Check for: ? Redness, swelling, or pain. ? Fluid or blood. ? Warmth. ? Pus or a bad smell. General instructions  Do not cross your legs. This may decrease blood flow to your feet.  Do not use heating pads or hot water bottles on your feet. They may burn your skin. If you have lost feeling in your feet or legs, you may not know this is happening until it is too late.  Protect your feet from hot and cold by wearing shoes, such as at the beach or on hot pavement.  Schedule a complete foot exam at least once a year (annually) or more often if you have foot problems. If you have foot problems, report any cuts, sores, or bruises to your health care provider immediately. Contact a health care provider if:  You have a medical condition that increases your risk of infection and you have any cuts, sores, or bruises on your feet.  You have an injury that is not   healing.  You have redness on your legs or feet.  You feel burning or tingling in your legs or feet.  You have pain or cramps in your legs and feet.  Your legs or feet are numb.  Your feet always feel cold.  You have pain around a toenail. Get help right away if:  You have a wound, scrape, corn, or callus on your foot and: ? You have pain, swelling, or redness that gets worse. ? You have fluid or blood coming from the wound, scrape, corn, or callus. ? Your wound, scrape, corn, or callus feels warm to the touch. ? You have pus or a bad smell coming from the wound, scrape, corn, or callus. ? You have a fever. ? You have a red line going up your leg. Summary  Check your feet every day  for cuts, sores, red spots, swelling, and blisters.  Moisturize feet and legs daily.  Wear shoes that fit properly and have enough cushioning.  If you have foot problems, report any cuts, sores, or bruises to your health care provider immediately.  Schedule a complete foot exam at least once a year (annually) or more often if you have foot problems. This information is not intended to replace advice given to you by your health care provider. Make sure you discuss any questions you have with your health care provider. Document Revised: 01/09/2019 Document Reviewed: 05/20/2016 Elsevier Patient Education  2020 [employer]  

## 2019-12-01 NOTE — Progress Notes (Signed)
Subjective: 64 year old female presents the office today for concerns of a possible bone spur, pain to left foot.  She points on the dorsal medial aspect of the foot where she gets discomfort she is asking about a possible steroid injection.  She also is diabetic and needs to have a diabetic foot exam performed.  She denies any ulcerations or numbness or tingling.  Denies any claudication symptoms. Denies any systemic complaints such as fevers, chills, nausea, vomiting. No acute changes since last appointment, and no other complaints at this time.   Objective: AAO x3, NAD DP/PT pulses palpable bilaterally, CRT less than 3 seconds Sensation intact with Semmes Weinstein monofilament Dorsal prominence with discomfort in the dorsal medial aspect of the left foot.  There is no pain on the tibialis anterior tendon insertion of the pain or stress test of the face.  Flexor, extensor tendons appear to be intact.  MMT 5/5. No ulcerations identified No pain with calf compression, swelling, warmth, erythema  Assessment: 64 year old female small bone spur fasciitis left; diabetic foot exam  Plan: -All treatment options discussed with the patient including all alternatives, risks, complications.  -X-rays obtained reviewed.  No evidence of acute fracture identified today. -Steroid injection performed.  Mixture of half cc dexamethasone phosphate, half cc Marcaine plain was infiltrated into the area of maximal tenderness without complications.  Postinjection care discussed. -Currently doing well from a diabetic standpoint.  Discussed daily foot inspection. -Patient encouraged to call the office with any questions, concerns, change in symptoms.   Trula Slade DPM

## 2020-01-09 DIAGNOSIS — H25011 Cortical age-related cataract, right eye: Secondary | ICD-10-CM | POA: Diagnosis not present

## 2020-01-09 DIAGNOSIS — H25041 Posterior subcapsular polar age-related cataract, right eye: Secondary | ICD-10-CM | POA: Diagnosis not present

## 2020-01-09 DIAGNOSIS — H2511 Age-related nuclear cataract, right eye: Secondary | ICD-10-CM | POA: Diagnosis not present

## 2020-01-09 DIAGNOSIS — H25811 Combined forms of age-related cataract, right eye: Secondary | ICD-10-CM | POA: Diagnosis not present

## 2020-01-21 DIAGNOSIS — E119 Type 2 diabetes mellitus without complications: Secondary | ICD-10-CM | POA: Diagnosis not present

## 2020-01-21 DIAGNOSIS — J0121 Acute recurrent ethmoidal sinusitis: Secondary | ICD-10-CM | POA: Diagnosis not present

## 2020-01-21 DIAGNOSIS — J320 Chronic maxillary sinusitis: Secondary | ICD-10-CM | POA: Diagnosis not present

## 2020-01-21 DIAGNOSIS — J322 Chronic ethmoidal sinusitis: Secondary | ICD-10-CM | POA: Diagnosis not present

## 2020-01-21 DIAGNOSIS — I10 Essential (primary) hypertension: Secondary | ICD-10-CM | POA: Diagnosis not present

## 2020-01-27 DIAGNOSIS — Z23 Encounter for immunization: Secondary | ICD-10-CM | POA: Diagnosis not present

## 2020-01-27 DIAGNOSIS — E785 Hyperlipidemia, unspecified: Secondary | ICD-10-CM | POA: Diagnosis not present

## 2020-01-27 DIAGNOSIS — E119 Type 2 diabetes mellitus without complications: Secondary | ICD-10-CM | POA: Diagnosis not present

## 2020-01-27 DIAGNOSIS — I10 Essential (primary) hypertension: Secondary | ICD-10-CM | POA: Diagnosis not present

## 2020-02-13 DIAGNOSIS — H25042 Posterior subcapsular polar age-related cataract, left eye: Secondary | ICD-10-CM | POA: Diagnosis not present

## 2020-02-13 DIAGNOSIS — H2512 Age-related nuclear cataract, left eye: Secondary | ICD-10-CM | POA: Diagnosis not present

## 2020-02-13 DIAGNOSIS — H25812 Combined forms of age-related cataract, left eye: Secondary | ICD-10-CM | POA: Diagnosis not present

## 2020-02-13 DIAGNOSIS — H25012 Cortical age-related cataract, left eye: Secondary | ICD-10-CM | POA: Diagnosis not present

## 2020-04-06 DIAGNOSIS — Z0120 Encounter for dental examination and cleaning without abnormal findings: Secondary | ICD-10-CM | POA: Diagnosis not present

## 2020-05-11 DIAGNOSIS — M150 Primary generalized (osteo)arthritis: Secondary | ICD-10-CM | POA: Diagnosis not present

## 2020-05-11 DIAGNOSIS — M6530 Trigger finger, unspecified finger: Secondary | ICD-10-CM | POA: Diagnosis not present

## 2020-05-11 DIAGNOSIS — M2550 Pain in unspecified joint: Secondary | ICD-10-CM | POA: Diagnosis not present

## 2020-05-11 DIAGNOSIS — M154 Erosive (osteo)arthritis: Secondary | ICD-10-CM | POA: Diagnosis not present

## 2020-05-14 DIAGNOSIS — Z1231 Encounter for screening mammogram for malignant neoplasm of breast: Secondary | ICD-10-CM | POA: Diagnosis not present

## 2020-05-14 LAB — HM MAMMOGRAPHY

## 2020-06-11 DIAGNOSIS — E785 Hyperlipidemia, unspecified: Secondary | ICD-10-CM | POA: Diagnosis not present

## 2020-06-11 DIAGNOSIS — I10 Essential (primary) hypertension: Secondary | ICD-10-CM | POA: Diagnosis not present

## 2020-06-11 DIAGNOSIS — E119 Type 2 diabetes mellitus without complications: Secondary | ICD-10-CM | POA: Diagnosis not present

## 2020-06-11 DIAGNOSIS — R5383 Other fatigue: Secondary | ICD-10-CM | POA: Diagnosis not present

## 2020-06-15 DIAGNOSIS — Z0000 Encounter for general adult medical examination without abnormal findings: Secondary | ICD-10-CM | POA: Diagnosis not present

## 2020-06-15 DIAGNOSIS — J329 Chronic sinusitis, unspecified: Secondary | ICD-10-CM | POA: Diagnosis not present

## 2020-06-15 DIAGNOSIS — Z136 Encounter for screening for cardiovascular disorders: Secondary | ICD-10-CM | POA: Diagnosis not present

## 2020-06-15 DIAGNOSIS — E119 Type 2 diabetes mellitus without complications: Secondary | ICD-10-CM | POA: Diagnosis not present

## 2020-06-23 DIAGNOSIS — E2839 Other primary ovarian failure: Secondary | ICD-10-CM | POA: Diagnosis not present

## 2020-06-23 DIAGNOSIS — Z780 Asymptomatic menopausal state: Secondary | ICD-10-CM | POA: Diagnosis not present

## 2020-06-23 LAB — HM DEXA SCAN: HM Dexa Scan: NORMAL

## 2020-07-27 DIAGNOSIS — Z85828 Personal history of other malignant neoplasm of skin: Secondary | ICD-10-CM | POA: Diagnosis not present

## 2020-07-27 DIAGNOSIS — L814 Other melanin hyperpigmentation: Secondary | ICD-10-CM | POA: Diagnosis not present

## 2020-07-27 DIAGNOSIS — L821 Other seborrheic keratosis: Secondary | ICD-10-CM | POA: Diagnosis not present

## 2020-07-27 DIAGNOSIS — L570 Actinic keratosis: Secondary | ICD-10-CM | POA: Diagnosis not present

## 2020-08-31 DIAGNOSIS — J320 Chronic maxillary sinusitis: Secondary | ICD-10-CM | POA: Diagnosis not present

## 2020-08-31 DIAGNOSIS — J322 Chronic ethmoidal sinusitis: Secondary | ICD-10-CM | POA: Diagnosis not present

## 2020-09-24 ENCOUNTER — Ambulatory Visit: Payer: Medicare Other

## 2020-09-24 ENCOUNTER — Other Ambulatory Visit: Payer: Self-pay

## 2020-09-24 ENCOUNTER — Ambulatory Visit (INDEPENDENT_AMBULATORY_CARE_PROVIDER_SITE_OTHER): Payer: Medicare Other | Admitting: Podiatry

## 2020-09-24 DIAGNOSIS — M722 Plantar fascial fibromatosis: Secondary | ICD-10-CM

## 2020-09-24 DIAGNOSIS — M7750 Other enthesopathy of unspecified foot: Secondary | ICD-10-CM

## 2020-09-24 MED ORDER — TRIAMCINOLONE ACETONIDE 10 MG/ML IJ SUSP
10.0000 mg | Freq: Once | INTRAMUSCULAR | Status: AC
  Administered 2020-09-24: 10 mg

## 2020-09-24 NOTE — Patient Instructions (Signed)

## 2020-09-27 NOTE — Progress Notes (Signed)
Subjective: 65 year old female presents the office today for concerns of recurrent left heel pain with plantar fasciitis.  She states that the brace is worn out and asking for another 1.  She does get some discomfort of the lateral aspect of her foot.  No recent injury or trauma any swelling.  She recently just went on vacation. Denies any systemic complaints such as fevers, chills, nausea, vomiting. No acute changes since last appointment, and no other complaints at this time.   Objective: AAO x3, NAD DP/PT pulses palpable bilaterally, CRT less than 3 seconds Tenderness palpation on the plantar medial aspect the calcaneus at the insertion of plantar fascial.  Plantar fascial appears to be intact.  There is no pain with lateral compression of calcaneus.  No edema, erythema.  No pain with Achilles tendon.  Slight discomfort subjectively along the course the peroneal tendons lateral aspect of foot but not able to elicit any tenderness today.  Flexor, extensor tendons appear to be intact.  MMT 5/5. No pain with calf compression, swelling, warmth, erythema  Assessment: 65 year old female with plantar fasciitis, tendinitis left foot  Plan: -All treatment options discussed with the patient including all alternatives, risks, complications.  -Steroid injection performed.  See procedure note below.  Dispensed plantar fascial brace.  Discussed stretching, icing daily as well as wearing supportive shoes. -Patient encouraged to call the office with any questions, concerns, change in symptoms.   Procedure: Injection Tendon/Ligament Discussed alternatives, risks, complications and verbal consent was obtained.  Location: Left plantar fascia at the glabrous junction; medial approach. Skin Prep: Alcohol  Injectate: 0.5cc 0.5% marcaine plain, 0.5 cc 2% lidocaine plain and, 1 cc kenalog 10. Disposition: Patient tolerated procedure well. Injection site dressed with a band-aid.  Post-injection care was discussed  and return precautions discussed.   Return if symptoms worsen or fail to improve.  Trula Slade DPM

## 2020-10-12 DIAGNOSIS — J0101 Acute recurrent maxillary sinusitis: Secondary | ICD-10-CM | POA: Diagnosis not present

## 2020-10-12 DIAGNOSIS — B370 Candidal stomatitis: Secondary | ICD-10-CM | POA: Diagnosis not present

## 2020-10-28 DIAGNOSIS — E119 Type 2 diabetes mellitus without complications: Secondary | ICD-10-CM | POA: Diagnosis not present

## 2020-10-28 DIAGNOSIS — H04123 Dry eye syndrome of bilateral lacrimal glands: Secondary | ICD-10-CM | POA: Diagnosis not present

## 2020-10-28 DIAGNOSIS — Z7984 Long term (current) use of oral hypoglycemic drugs: Secondary | ICD-10-CM | POA: Diagnosis not present

## 2020-10-28 DIAGNOSIS — H26493 Other secondary cataract, bilateral: Secondary | ICD-10-CM | POA: Diagnosis not present

## 2021-01-12 ENCOUNTER — Other Ambulatory Visit: Payer: Self-pay

## 2021-01-12 ENCOUNTER — Ambulatory Visit (INDEPENDENT_AMBULATORY_CARE_PROVIDER_SITE_OTHER): Payer: Medicare Other | Admitting: Family

## 2021-01-12 ENCOUNTER — Encounter: Payer: Self-pay | Admitting: Family

## 2021-01-12 VITALS — BP 140/80 | HR 89 | Temp 98.0°F | Ht 61.0 in | Wt 205.2 lb

## 2021-01-12 DIAGNOSIS — M1990 Unspecified osteoarthritis, unspecified site: Secondary | ICD-10-CM

## 2021-01-12 DIAGNOSIS — F418 Other specified anxiety disorders: Secondary | ICD-10-CM | POA: Diagnosis not present

## 2021-01-12 DIAGNOSIS — I10 Essential (primary) hypertension: Secondary | ICD-10-CM | POA: Insufficient documentation

## 2021-01-12 DIAGNOSIS — E119 Type 2 diabetes mellitus without complications: Secondary | ICD-10-CM | POA: Diagnosis not present

## 2021-01-12 DIAGNOSIS — E1169 Type 2 diabetes mellitus with other specified complication: Secondary | ICD-10-CM | POA: Diagnosis not present

## 2021-01-12 DIAGNOSIS — E785 Hyperlipidemia, unspecified: Secondary | ICD-10-CM

## 2021-01-12 LAB — COMPREHENSIVE METABOLIC PANEL
ALT: 38 U/L — ABNORMAL HIGH (ref 0–35)
AST: 18 U/L (ref 0–37)
Albumin: 4.2 g/dL (ref 3.5–5.2)
Alkaline Phosphatase: 60 U/L (ref 39–117)
BUN: 14 mg/dL (ref 6–23)
CO2: 27 mEq/L (ref 19–32)
Calcium: 9.6 mg/dL (ref 8.4–10.5)
Chloride: 100 mEq/L (ref 96–112)
Creatinine, Ser: 0.52 mg/dL (ref 0.40–1.20)
GFR: 97.04 mL/min (ref >60.00)
Glucose, Bld: 107 mg/dL — ABNORMAL HIGH (ref 70–99)
Potassium: 4.3 mEq/L (ref 3.5–5.1)
Sodium: 137 mEq/L (ref 135–145)
Total Bilirubin: 0.4 mg/dL (ref 0.2–1.2)
Total Protein: 6.7 g/dL (ref 6.0–8.3)

## 2021-01-12 LAB — CBC WITH DIFFERENTIAL/PLATELET
Basophils Absolute: 0 10*3/uL (ref 0.0–0.1)
Basophils Relative: 0.4 % (ref 0.0–3.0)
Eosinophils Absolute: 0.2 10*3/uL (ref 0.0–0.7)
Eosinophils Relative: 2.3 % (ref 0.0–5.0)
HCT: 41.7 % (ref 36.0–46.0)
Hemoglobin: 13.3 g/dL (ref 12.0–15.0)
Lymphocytes Relative: 20.3 % (ref 12.0–46.0)
Lymphs Abs: 1.8 10*3/uL (ref 0.7–4.0)
MCHC: 31.9 g/dL (ref 30.0–36.0)
MCV: 85.2 fl (ref 78.0–100.0)
Monocytes Absolute: 0.5 10*3/uL (ref 0.1–1.0)
Monocytes Relative: 6 % (ref 3.0–12.0)
Neutro Abs: 6.3 10*3/uL (ref 1.4–7.7)
Neutrophils Relative %: 71 % (ref 43.0–77.0)
Platelets: 248 10*3/uL (ref 150.0–400.0)
RBC: 4.9 Mil/uL (ref 3.87–5.11)
RDW: 14.8 % (ref 11.5–15.5)
WBC: 8.9 10*3/uL (ref 4.0–10.5)

## 2021-01-12 LAB — LIPID PANEL
Cholesterol: 111 mg/dL (ref 0–200)
HDL: 43.9 mg/dL (ref >39.00)
NonHDL: 67.5
Total CHOL/HDL Ratio: 3
Triglycerides: 207 mg/dL — ABNORMAL HIGH (ref 0.0–149.0)
VLDL: 41.4 mg/dL — ABNORMAL HIGH (ref 0.0–40.0)

## 2021-01-12 LAB — LDL CHOLESTEROL, DIRECT: Direct LDL: 49 mg/dL

## 2021-01-12 LAB — HEMOGLOBIN A1C: Hgb A1c MFr Bld: 7 % — ABNORMAL HIGH (ref 4.6–6.5)

## 2021-01-12 NOTE — Progress Notes (Signed)
Veronica Galvan is a 65 y.o. female with the following history as recorded in EpicCare:  Patient Active Problem List   Diagnosis Date Noted   Primary hypertension 01/12/2021   Hyperlipidemia associated with type 2 diabetes mellitus ([place]) 01/12/2021   Arthritis 01/12/2021   Type II diabetes mellitus ([place]) 11/25/2019   Ingrown toenail 09/05/2018   Neck pain 01/29/2018   Tick bite 10/24/2017    Current Outpatient Medications  Medication Sig Dispense Refill   Ascorbic Acid (VITAMIN C) 1000 MG tablet Take 1,000 mg by mouth every morning.     aspirin EC 81 MG tablet Take 81 mg by mouth every morning.     atorvastatin (LIPITOR) 40 MG tablet Take 40 mg by mouth every evening.     Calcium Carb-Cholecalciferol (CALCIUM 600+D3 PO) Take 1 tablet by mouth every morning.     celecoxib (CELEBREX) 200 MG capsule Take 200 mg by mouth daily.     Cholecalciferol (VITAMIN D) 2000 units tablet Take 2,000 Units by mouth daily.     Coenzyme Q10 300 MG CAPS Take 1 capsule by mouth every morning.     diclofenac Sodium (VOLTAREN) 1 % GEL Apply topically 4 (four) times daily.     hydrOXYzine (ATARAX/VISTARIL) 10 MG tablet Take 10 mg by mouth 3 (three) times daily as needed.     lisinopril (ZESTRIL) 5 MG tablet Take 5 mg by mouth 2 (two) times daily.     loratadine (CLARITIN) 10 MG tablet Take 10 mg by mouth daily.     LUTEIN-ZEAXANTHIN PO Take 1 tablet by mouth every morning.     montelukast (SINGULAIR) 10 MG tablet Take 10 mg by mouth at bedtime.     Multiple Vitamins-Minerals (MULTIVITAMIN WITH MINERALS) tablet Take 1 tablet by mouth every morning.     mupirocin nasal ointment (BACTROBAN) 2 % Place 1 application into the nose 2 (two) times daily. Use as directed by ENT     Omega-3 Fatty Acids (EQL OMEGA 3 FISH OIL) 1400 MG CAPS Take 2 capsules by mouth daily.     omeprazole (PRILOSEC) 20 MG capsule Take 20 mg by mouth every morning.     OZEMPIC, 1 MG/DOSE, 2 MG/1.5ML SOPN Inject into the skin.     SYNJARDY  XR 12.08-998 MG TB24 Take 1 tablet by mouth 2 (two) times daily.  5   Triamcinolone Acetonide (NASACORT ALLERGY 24HR NA) Place into the nose.     Turmeric, Curcuma Longa, (TURMERIC ROOT) POWD Take by mouth. TURMERIC ROOT EXTRACT     UNABLE TO FIND Med Name: Cinnamon/Cinsilin     VITAMIN E PO Take by mouth.     No current facility-administered medications for this visit.    Allergies: Codeine, Penicillins, Benzonatate, and Epinephrine  Past Medical History:  Diagnosis Date   Anxiety    Arthritis    hands   Diabetes mellitus without complication ([place])    type 2   Family history of adverse reaction to anesthesia    son has malignant hyperthermia, daughter does not daughter recently had c section without problems   GERD (gastroesophageal reflux disease)    Headache    sinus   Hypertension    PONV (postoperative nausea and vomiting)    nausea only   Ulcer, stomach peptic yrs ago    Past Surgical History:  Procedure Laterality Date   both hells bone spur repair     both heels with metal clips   both shoulder rotator cuff repair  CESAREAN SECTION     x 1   COLONOSCOPY WITH PROPOFOL N/A 09/07/2016   Procedure: COLONOSCOPY WITH PROPOFOL;  Surgeon: Garlan Fair, MD;  Location: [facility];  Service: Endoscopy;  Laterality: N/A;   colonscopy  06/2011   polyps   VESICO-VAGINAL FISTULA REPAIR      Family History  Problem Relation Age of Onset   Hypertension Mother    COPD Mother    Cancer Mother    Arthritis Mother    Hypertension Father    Hyperlipidemia Father    Diabetes Father    Cancer Father    Alcohol abuse Father    Diabetes Brother    Alcohol abuse Brother    Hypertension Brother    Arthritis Maternal Grandmother    Birth defects Maternal Grandmother    Diabetes Paternal Grandmother    Heart disease Paternal Grandfather     Social History   Tobacco Use   Smoking status: Former    Packs/day: 1.00    Years: 14.00    Pack years: 14.00    Types:  Cigarettes   Smokeless tobacco: Never   Tobacco comments:    quit 31 yrs ago  Substance Use Topics   Alcohol use: Yes    Comment: rare    Subjective:  Presents today as a new patient; History of hypertension, hyperlipidemia, Type 2 Diabetes; due for updated labs today;  Chronic sinus issues- under care of ENT;  Dr. Trudie Reed- rheumatology; [facility]) Dr. Jacqualyn Posey- podiatry; Op[facility]mology at [facility];   Objective:  Vitals:   01/12/21 0912  BP: 140/80  Pulse: 89  Temp: 98 F (36.7 C)  TempSrc: Oral  SpO2: 99%  Weight: 205 lb 3.2 oz (93.1 kg)  Height: $Remove'5\' 1"'tPNpHbC$  (1.549 m)    General: Well developed, well nourished, in no acute distress  Skin : Warm and dry.  Head: Normocephalic and atraumatic  Eyes: Sclera and conjunctiva clear; pupils round and reactive to light; extraocular movements intact  Ears: External normal; canals clear; tympanic membranes normal  Oropharynx: Pink, supple. No suspicious lesions  Neck: Supple without thyromegaly, adenopathy  Lungs: Respirations unlabored; clear to auscultation bilaterally without wheeze, rales, rhonchi  CVS exam: normal rate and regular rhythm.  Neurologic: Alert and oriented; speech intact; face symmetrical; moves all extremities well; CNII-XII intact without focal deficit  Assessment:  1. Type 2 diabetes mellitus without complication, without long-term current use of insulin ([place])   2. Primary hypertension   3. Hyperlipidemia associated with type 2 diabetes mellitus ([place])   4. Arthritis   5. Situational anxiety     Plan:  Update labs today; patient will continue same medications- no refills needed at this time;  She plans to start walking more regularly to work on weight loss goals;  Follow up in 3 months, sooner prn.  This visit occurred during the SARS-CoV-2 public health emergency.  Safety protocols were in place, including screening questions prior to the visit, additional usage of staff PPE, and extensive  cleaning of exam room while observing appropriate contact time as indicated for disinfecting solutions.    Return in about 3 months (around 04/13/2021).  Orders Placed This Encounter  Procedures   HM MAMMOGRAPHY    This external order was created through the Results Console.   HM DEXA SCAN    This external order was created through the Results Console.   CBC with Differential/Platelet   Comp Met (CMET)   Lipid panel   Hemoglobin A1c   HM  COLONOSCOPY    This external order was created through the Results Console.    Requested Prescriptions    No prescriptions requested or ordered in this encounter

## 2021-02-09 ENCOUNTER — Encounter: Payer: Self-pay | Admitting: Family

## 2021-03-04 DIAGNOSIS — J0121 Acute recurrent ethmoidal sinusitis: Secondary | ICD-10-CM | POA: Diagnosis not present

## 2021-03-04 DIAGNOSIS — J322 Chronic ethmoidal sinusitis: Secondary | ICD-10-CM | POA: Diagnosis not present

## 2021-03-22 ENCOUNTER — Encounter: Payer: Self-pay | Admitting: Family

## 2021-04-13 ENCOUNTER — Ambulatory Visit: Payer: Medicare Other | Admitting: Family

## 2021-04-16 ENCOUNTER — Ambulatory Visit (INDEPENDENT_AMBULATORY_CARE_PROVIDER_SITE_OTHER): Payer: Medicare Other | Admitting: Family

## 2021-04-16 ENCOUNTER — Encounter: Payer: Self-pay | Admitting: Family

## 2021-04-16 VITALS — BP 137/80 | HR 102 | Temp 97.7°F | Ht 61.0 in | Wt 206.6 lb

## 2021-04-16 DIAGNOSIS — Z1231 Encounter for screening mammogram for malignant neoplasm of breast: Secondary | ICD-10-CM

## 2021-04-16 DIAGNOSIS — Z1211 Encounter for screening for malignant neoplasm of colon: Secondary | ICD-10-CM

## 2021-04-16 DIAGNOSIS — Z86010 Personal history of colonic polyps: Secondary | ICD-10-CM | POA: Diagnosis not present

## 2021-04-16 DIAGNOSIS — E119 Type 2 diabetes mellitus without complications: Secondary | ICD-10-CM

## 2021-04-16 DIAGNOSIS — Z23 Encounter for immunization: Secondary | ICD-10-CM

## 2021-04-16 LAB — COMPREHENSIVE METABOLIC PANEL
ALT: 46 U/L — ABNORMAL HIGH (ref 0–35)
AST: 32 U/L (ref 0–37)
Albumin: 4.6 g/dL (ref 3.5–5.2)
Alkaline Phosphatase: 70 U/L (ref 39–117)
BUN: 17 mg/dL (ref 6–23)
CO2: 25 mEq/L (ref 19–32)
Calcium: 10 mg/dL (ref 8.4–10.5)
Chloride: 100 mEq/L (ref 96–112)
Creatinine, Ser: 0.57 mg/dL (ref 0.40–1.20)
GFR: 94.74 mL/min (ref >60.00)
Glucose, Bld: 186 mg/dL — ABNORMAL HIGH (ref 70–99)
Potassium: 4.3 mEq/L (ref 3.5–5.1)
Sodium: 138 mEq/L (ref 135–145)
Total Bilirubin: 0.5 mg/dL (ref 0.2–1.2)
Total Protein: 7.3 g/dL (ref 6.0–8.3)

## 2021-04-16 LAB — HEMOGLOBIN A1C: Hgb A1c MFr Bld: 7.3 % — ABNORMAL HIGH (ref 4.6–6.5)

## 2021-04-16 MED ORDER — OZEMPIC (1 MG/DOSE) 2 MG/1.5ML ~~LOC~~ SOPN: 1.0000 mg | PEN_INJECTOR | SUBCUTANEOUS | 1 refills | Status: DC

## 2021-04-16 NOTE — Progress Notes (Signed)
Veronica Galvan is a 65 y.o. female with the following history as recorded in EpicCare:  Patient Active Problem List   Diagnosis Date Noted   Primary hypertension 01/12/2021   Hyperlipidemia associated with type 2 diabetes mellitus ([place]) 01/12/2021   Arthritis 01/12/2021   Type II diabetes mellitus ([place]) 11/25/2019   Ingrown toenail 09/05/2018   Neck pain 01/29/2018   Tick bite 10/24/2017    Current Outpatient Medications  Medication Sig Dispense Refill   Ascorbic Acid (VITAMIN C) 1000 MG tablet Take 1,000 mg by mouth every morning.     aspirin EC 81 MG tablet Take 81 mg by mouth every morning.     atorvastatin (LIPITOR) 40 MG tablet Take 40 mg by mouth every evening.     Calcium Carb-Cholecalciferol (CALCIUM 600+D3 PO) Take 1 tablet by mouth every morning.     celecoxib (CELEBREX) 200 MG capsule Take 200 mg by mouth daily.     Cholecalciferol (VITAMIN D) 2000 units tablet Take 2,000 Units by mouth daily.     Coenzyme Q10 300 MG CAPS Take 1 capsule by mouth every morning.     diclofenac Sodium (VOLTAREN) 1 % GEL Apply topically 4 (four) times daily.     hydrOXYzine (ATARAX) 10 MG tablet Take 10 mg by mouth 3 (three) times daily as needed.     lisinopril (ZESTRIL) 5 MG tablet Take 5 mg by mouth 2 (two) times daily.     loratadine (CLARITIN) 10 MG tablet Take 10 mg by mouth daily.     LUTEIN-ZEAXANTHIN PO Take 1 tablet by mouth every morning.     montelukast (SINGULAIR) 10 MG tablet Take 10 mg by mouth at bedtime.     Multiple Vitamins-Minerals (MULTIVITAMIN WITH MINERALS) tablet Take 1 tablet by mouth every morning.     mupirocin nasal ointment (BACTROBAN) 2 % Place 1 application into the nose 2 (two) times daily. Use as directed by ENT     Omega-3 Fatty Acids (EQL OMEGA 3 FISH OIL) 1400 MG CAPS Take 2 capsules by mouth daily.     omeprazole (PRILOSEC) 20 MG capsule Take 20 mg by mouth every morning.     SYNJARDY XR 12.08-998 MG TB24 Take 1 tablet by mouth 2 (two) times daily.  5    Triamcinolone Acetonide (NASACORT ALLERGY 24HR NA) Place into the nose.     Turmeric, Curcuma Longa, (TURMERIC ROOT) POWD Take by mouth. TURMERIC ROOT EXTRACT     UNABLE TO FIND Med Name: Cinnamon/Cinsilin     VITAMIN E PO Take by mouth.     OZEMPIC, 1 MG/DOSE, 2 MG/1.5ML SOPN Inject 1 mg into the skin once a week. 4.5 mL 1   No current facility-administered medications for this visit.    Allergies: Codeine, Penicillins, Benzonatate, and Epinephrine  Past Medical History:  Diagnosis Date   Anxiety    Arthritis    hands   Diabetes mellitus without complication ([place])    type 2   Family history of adverse reaction to anesthesia    son has malignant hyperthermia, daughter does not daughter recently had c section without problems   GERD (gastroesophageal reflux disease)    Headache    sinus   Hypertension    PONV (postoperative nausea and vomiting)    nausea only   Ulcer, stomach peptic yrs ago    Past Surgical History:  Procedure Laterality Date   both hells bone spur repair     both heels with metal clips   both shoulder  rotator cuff repair     CESAREAN SECTION     x 1   COLONOSCOPY WITH PROPOFOL N/A 09/07/2016   Procedure: COLONOSCOPY WITH PROPOFOL;  Surgeon: Garlan Fair, MD;  Location: [facility];  Service: Endoscopy;  Laterality: N/A;   colonscopy  06/2011   polyps   VESICO-VAGINAL FISTULA REPAIR      Family History  Problem Relation Age of Onset   Hypertension Mother    COPD Mother    Cancer Mother    Arthritis Mother    Hypertension Father    Hyperlipidemia Father    Diabetes Father    Cancer Father    Alcohol abuse Father    Diabetes Brother    Alcohol abuse Brother    Hypertension Brother    Arthritis Maternal Grandmother    Birth defects Maternal Grandmother    Diabetes Paternal Grandmother    Heart disease Paternal Grandfather     Social History   Tobacco Use   Smoking status: Former    Packs/day: 1.00    Years: 14.00    Pack years: 14.00     Types: Cigarettes   Smokeless tobacco: Never   Tobacco comments:    quit 31 yrs ago  Substance Use Topics   Alcohol use: Yes    Comment: rare    Subjective:   3 month follow up on Type 2 diabetes; no acute concerns today; currently on Ozempic and Synjardy;  Does need refill; fasting glucose averaging 120;  Needs order updated for mammogram/ colonoscopy;  Would be agreeable to final Pneumovax;     Objective:  Vitals:   04/16/21 0826  BP: 137/80  Pulse: (!) 102  Temp: 97.7 F (36.5 C)  TempSrc: Oral  SpO2: 96%  Weight: 206 lb 9.6 oz (93.7 kg)  Height: 5' 1"  (1.549 m)    General: Well developed, well nourished, in no acute distress  Skin : Warm and dry.  Head: Normocephalic and atraumatic  Lungs: Respirations unlabored; clear to auscultation bilaterally without wheeze, rales, rhonchi  CVS exam: normal rate and regular rhythm.  Neurologic: Alert and oriented; speech intact; face symmetrical; moves all extremities well; CNII-XII intact without focal deficit   Assessment:  1. Type 2 diabetes mellitus without complication, without long-term current use of insulin ([place])   2. Visit for screening mammogram   3. Encounter for colonoscopy due to history of adenomatous colonic polyps   4. Need for pneumococcal vaccine     Plan:  Update labs and refills today; plan for 4 month follow up; Order updated; Order updated; Pneumovax 23 given; series completed;   This visit occurred during the SARS-CoV-2 public health emergency.  Safety protocols were in place, including screening questions prior to the visit, additional usage of staff PPE, and extensive cleaning of exam room while observing appropriate contact time as indicated for disinfecting solutions.    No follow-ups on file.  Orders Placed This Encounter  Procedures   MM Digital Screening    Standing Status:   Future    Standing Expiration Date:   04/16/2022    Order Specific Question:   Reason for Exam (SYMPTOM  OR  DIAGNOSIS REQUIRED)    Answer:   s    Order Specific Question:   Preferred imaging location?    Answer:   [facility]   Pneumococcal polysaccharide vaccine 23-valent greater than or equal to 2yo subcutaneous/IM   Comp Met (CMET)   Hemoglobin A1c   Hepatitis C Antibody   Ambulatory  referral to Gastroenterology    Referral Priority:   Routine    Referral Type:   Consultation    Referral Reason:   Specialty Services Required    Number of Visits Requested:   1    Requested Prescriptions   Signed Prescriptions Disp Refills   OZEMPIC, 1 MG/DOSE, 2 MG/1.5ML SOPN 4.5 mL 1    Sig: Inject 1 mg into the skin once a week.

## 2021-04-19 LAB — HEPATITIS C ANTIBODY
Hepatitis C Ab: NONREACTIVE
SIGNAL TO CUT-OFF: <0.02 (ref <1.00)

## 2021-04-21 ENCOUNTER — Encounter: Payer: Self-pay | Admitting: Family Medicine

## 2021-04-21 ENCOUNTER — Ambulatory Visit (INDEPENDENT_AMBULATORY_CARE_PROVIDER_SITE_OTHER): Payer: Medicare Other | Admitting: Family Medicine

## 2021-04-21 VITALS — BP 141/76 | HR 99 | Temp 98.3°F | Ht 61.0 in | Wt 204.4 lb

## 2021-04-21 DIAGNOSIS — J0141 Acute recurrent pansinusitis: Secondary | ICD-10-CM | POA: Diagnosis not present

## 2021-04-21 MED ORDER — DOXYCYCLINE HYCLATE 100 MG PO TABS: 100.0000 mg | ORAL_TABLET | Freq: Two times a day (BID) | ORAL | 0 refills | Status: AC

## 2021-04-21 NOTE — Progress Notes (Signed)
Chief Complaint  Patient presents with   Sinusitis    [name] here for URI complaints.  Duration: 2 days  Associated symptoms: subjective fever, sinus congestion, sinus pain, rhinorrhea, and ear fullness Denies: itchy watery eyes, ear pain, ear drainage, sore throat, wheezing, shortness of breath, myalgia, and coughing , dental pain Treatment to date: nasal rinses Sick contacts: No  Past Medical History:  Diagnosis Date   Anxiety    Arthritis    hands   Diabetes mellitus without complication ([place])    type 2   Family history of adverse reaction to anesthesia    son has malignant hyperthermia, daughter does not daughter recently had c section without problems   GERD (gastroesophageal reflux disease)    Headache    sinus   Hypertension    PONV (postoperative nausea and vomiting)    nausea only   Ulcer, stomach peptic yrs ago    Objective BP (!) 141/76    Pulse 99    Temp 98.3 F (36.8 C) (Oral)    Ht 5\' 1"  (1.549 m)    Wt 204 lb 6 oz (92.7 kg)    SpO2 98%    BMI 38.62 kg/m  General: Awake, alert, appears stated age HEENT: AT, [place], ears patent b/l and TM's neg, nares patent w/o discharge, TTP over b/l max and frontal sinuses, pharynx pink and without exudates, MMM Neck: No masses or asymmetry Heart: RRR Lungs: CTAB, no accessory muscle use Psych: Age appropriate judgment and insight, normal mood and affect  Acute recurrent pansinusitis - Plan: doxycycline (VIBRA-TABS) 100 MG tablet  Continue to push fluids, practice good hand hygiene, cover mouth when coughing. F/u prn. If starting to experience fevers, shaking, or shortness of breath, seek immediate care. Pt voiced understanding and agreement to the plan.  [place], DO 04/21/21 1:01 PM

## 2021-04-21 NOTE — Patient Instructions (Addendum)
Continue to push fluids, practice good hand hygiene.  If you start having fevers, shaking or shortness of breath, seek immediate care.  You should be safe to travel.   OK to take Tylenol 1000 mg (2 extra strength tabs) or 975 mg (3 regular strength tabs) every 6 hours as needed.  Let us know if you need anything.

## 2021-05-11 DIAGNOSIS — M154 Erosive (osteo)arthritis: Secondary | ICD-10-CM | POA: Diagnosis not present

## 2021-05-11 DIAGNOSIS — Z6839 Body mass index (BMI) 39.0-39.9, adult: Secondary | ICD-10-CM | POA: Diagnosis not present

## 2021-05-11 DIAGNOSIS — M2550 Pain in unspecified joint: Secondary | ICD-10-CM | POA: Diagnosis not present

## 2021-05-11 DIAGNOSIS — M150 Primary generalized (osteo)arthritis: Secondary | ICD-10-CM | POA: Diagnosis not present

## 2021-05-17 ENCOUNTER — Ambulatory Visit (HOSPITAL_BASED_OUTPATIENT_CLINIC_OR_DEPARTMENT_OTHER): Payer: Medicare Other

## 2021-05-25 ENCOUNTER — Inpatient Hospital Stay (HOSPITAL_BASED_OUTPATIENT_CLINIC_OR_DEPARTMENT_OTHER): Admission: RE | Admit: 2021-05-25 | Payer: Medicare Other | Source: Ambulatory Visit

## 2021-05-25 ENCOUNTER — Encounter: Payer: Self-pay | Admitting: Family

## 2021-05-25 ENCOUNTER — Other Ambulatory Visit: Payer: Self-pay | Admitting: Family

## 2021-05-25 MED ORDER — DOXYCYCLINE HYCLATE 100 MG PO TABS: 100.0000 mg | ORAL_TABLET | Freq: Two times a day (BID) | ORAL | 0 refills | Status: DC

## 2021-05-26 ENCOUNTER — Encounter (HOSPITAL_BASED_OUTPATIENT_CLINIC_OR_DEPARTMENT_OTHER): Payer: Self-pay

## 2021-05-26 ENCOUNTER — Ambulatory Visit (HOSPITAL_BASED_OUTPATIENT_CLINIC_OR_DEPARTMENT_OTHER)
Admission: RE | Admit: 2021-05-26 | Discharge: 2021-05-26 | Disposition: A | Discharge status: Home / Self Care | Payer: Medicare Other | Source: Ambulatory Visit | Attending: Family | Admitting: Family

## 2021-05-26 ENCOUNTER — Other Ambulatory Visit: Payer: Self-pay

## 2021-05-26 DIAGNOSIS — Z1231 Encounter for screening mammogram for malignant neoplasm of breast: Secondary | ICD-10-CM | POA: Insufficient documentation

## 2021-06-02 DIAGNOSIS — H18513 Endothelial corneal dystrophy, bilateral: Secondary | ICD-10-CM | POA: Diagnosis not present

## 2021-06-02 DIAGNOSIS — H04123 Dry eye syndrome of bilateral lacrimal glands: Secondary | ICD-10-CM | POA: Diagnosis not present

## 2021-06-14 ENCOUNTER — Ambulatory Visit (INDEPENDENT_AMBULATORY_CARE_PROVIDER_SITE_OTHER): Payer: Medicare Other | Admitting: Family Medicine

## 2021-06-14 ENCOUNTER — Encounter: Payer: Self-pay | Admitting: Family Medicine

## 2021-06-14 VITALS — BP 132/72 | HR 100 | Temp 98.1°F | Ht 61.0 in | Wt 205.5 lb

## 2021-06-14 DIAGNOSIS — J40 Bronchitis, not specified as acute or chronic: Secondary | ICD-10-CM

## 2021-06-14 MED ORDER — PREDNISONE 20 MG PO TABS: 40.0000 mg | ORAL_TABLET | Freq: Every day | ORAL | 0 refills | Status: AC

## 2021-06-14 MED ORDER — MUPIROCIN CALCIUM 2 % NA OINT: 1.0000 "application " | TOPICAL_OINTMENT | Freq: Two times a day (BID) | NASAL | 11 refills | Status: DC

## 2021-06-14 MED ORDER — ALBUTEROL SULFATE HFA 108 (90 BASE) MCG/ACT IN AERS
2.0000 | INHALATION_SPRAY | Freq: Four times a day (QID) | RESPIRATORY_TRACT | 0 refills | Status: DC | PRN
Start: 2021-06-14 — End: 2021-08-19

## 2021-06-14 MED ORDER — AZITHROMYCIN 250 MG PO TABS: ORAL_TABLET | ORAL | 0 refills | Status: DC

## 2021-06-14 NOTE — Patient Instructions (Signed)
Take the prednisone in the morning. Stop if the side effects are not tolerable.   Continue to push fluids, practice good hand hygiene, and cover your mouth if you cough.  If you start having fevers, shaking or shortness of breath, seek immediate care.  OK to take Tylenol 1000 mg (2 extra strength tabs) or 975 mg (3 regular strength tabs) every 6 hours as needed.  Take the Zpak if no better in 2 days, you may not need that one.   Let us know if you need anything.

## 2021-06-14 NOTE — Progress Notes (Signed)
Chief Complaint  Patient presents with   Sinusitis    Veronica Galvan here for URI complaints.  Duration: 3 weeks  Associated symptoms: subjective fever, sinus congestion, sinus pain, rhinorrhea, ear pain, myalgia, and coughing up drainage Denies: itchy watery eyes, ear drainage, sore throat, wheezing, shortness of breath, myalgia, and dental pain Treatment to date: doxycycline on 1/24. Started having s/s's again 5 d ago; [place] Sick contacts: No Tested neg for covid.  Past Medical History:  Diagnosis Date   Anxiety    Arthritis    hands   Diabetes mellitus without complication ([place])    type 2   Family history of adverse reaction to anesthesia    son has malignant hyperthermia, daughter does not daughter recently had c section without problems   GERD (gastroesophageal reflux disease)    Headache    sinus   Hypertension    PONV (postoperative nausea and vomiting)    nausea only   Ulcer, stomach peptic yrs ago    Objective BP 132/72    Pulse 100    Temp 98.1 F (36.7 C) (Oral)    Ht 5\' 1"  (1.549 m)    Wt 205 lb 8 oz (93.2 kg)    SpO2 96%    BMI 38.83 kg/m  General: Awake, alert, appears stated age HEENT: AT, [place], ears patent b/l and TM's neg, nares patent w/o discharge, pharynx pink and without exudates, MMM Neck: No masses or asymmetry Heart: RRR Lungs: +faint wheezes heard at bases, no accessory muscle use Psych: Age appropriate judgment and insight, normal mood and affect  Wheezy bronchitis - Plan: azithromycin (ZITHROMAX) 250 MG tablet, predniSONE (DELTASONE) 20 MG tablet, albuterol (VENTOLIN HFA) 108 (90 Base) MCG/ACT inhaler  She historically does not do well w steroids but is willing to try this time. Will stop if AE's resume. SABA prn. Zpak if no better in 2 d.  Continue to push fluids, practice good hand hygiene, cover mouth when coughing. F/u prn. If starting to experience fevers, shaking, or shortness of breath, seek immediate care. Pt voiced understanding and  agreement to the plan.  [place], DO 06/14/21 10:41 AM

## 2021-06-18 ENCOUNTER — Ambulatory Visit (INDEPENDENT_AMBULATORY_CARE_PROVIDER_SITE_OTHER): Payer: Medicare Other | Admitting: Family

## 2021-06-18 ENCOUNTER — Encounter: Payer: Self-pay | Admitting: Family

## 2021-06-18 ENCOUNTER — Ambulatory Visit (HOSPITAL_BASED_OUTPATIENT_CLINIC_OR_DEPARTMENT_OTHER)
Admission: RE | Admit: 2021-06-18 | Discharge: 2021-06-18 | Disposition: A | Discharge status: Home / Self Care | Payer: Medicare Other | Source: Ambulatory Visit | Attending: Family | Admitting: Family

## 2021-06-18 ENCOUNTER — Other Ambulatory Visit: Payer: Self-pay

## 2021-06-18 VITALS — BP 153/54 | HR 94 | Resp 20 | Ht 61.0 in | Wt 204.4 lb

## 2021-06-18 DIAGNOSIS — R059 Cough, unspecified: Secondary | ICD-10-CM | POA: Insufficient documentation

## 2021-06-18 DIAGNOSIS — R062 Wheezing: Secondary | ICD-10-CM

## 2021-06-18 DIAGNOSIS — R0602 Shortness of breath: Secondary | ICD-10-CM | POA: Diagnosis not present

## 2021-06-18 DIAGNOSIS — H9202 Otalgia, left ear: Secondary | ICD-10-CM | POA: Diagnosis not present

## 2021-06-18 DIAGNOSIS — J329 Chronic sinusitis, unspecified: Secondary | ICD-10-CM | POA: Diagnosis not present

## 2021-06-18 MED ORDER — LEVOFLOXACIN 500 MG PO TABS: 500.0000 mg | ORAL_TABLET | Freq: Every day | ORAL | 0 refills | Status: AC

## 2021-06-18 NOTE — Progress Notes (Signed)
Veronica Galvan is a 66 y.o. female with the following history as recorded in EpicCare:  Patient Active Problem List   Diagnosis Date Noted   Primary hypertension 01/12/2021   Hyperlipidemia associated with type 2 diabetes mellitus ([place]) 01/12/2021   Arthritis 01/12/2021   Type II diabetes mellitus ([place]) 11/25/2019   Ingrown toenail 09/05/2018   Neck pain 01/29/2018   Tick bite 10/24/2017    Current Outpatient Medications  Medication Sig Dispense Refill   albuterol (VENTOLIN HFA) 108 (90 Base) MCG/ACT inhaler Inhale 2 puffs into the lungs every 6 (six) hours as needed for wheezing or shortness of breath. 8 g 0   Ascorbic Acid (VITAMIN C) 1000 MG tablet Take 1,000 mg by mouth every morning.     aspirin EC 81 MG tablet Take 81 mg by mouth every morning.     atorvastatin (LIPITOR) 40 MG tablet Take 40 mg by mouth every evening.     azithromycin (ZITHROMAX) 250 MG tablet Take 2 tabs the first day and then 1 tab daily until you run out. 6 tablet 0   Calcium Carb-Cholecalciferol (CALCIUM 600+D3 PO) Take 1 tablet by mouth every morning.     celecoxib (CELEBREX) 200 MG capsule Take 200 mg by mouth daily.     Cholecalciferol (VITAMIN D) 2000 units tablet Take 2,000 Units by mouth daily.     Coenzyme Q10 300 MG CAPS Take 1 capsule by mouth every morning.     diclofenac Sodium (VOLTAREN) 1 % GEL Apply topically 4 (four) times daily.     doxycycline (VIBRA-TABS) 100 MG tablet Take 1 tablet (100 mg total) by mouth 2 (two) times daily. 20 tablet 0   hydrOXYzine (ATARAX) 10 MG tablet Take 10 mg by mouth 3 (three) times daily as needed.     lisinopril (ZESTRIL) 5 MG tablet Take 5 mg by mouth 2 (two) times daily.     loratadine (CLARITIN) 10 MG tablet Take 10 mg by mouth daily.     LUTEIN-ZEAXANTHIN PO Take 1 tablet by mouth every morning.     montelukast (SINGULAIR) 10 MG tablet Take 10 mg by mouth at bedtime.     Multiple Vitamins-Minerals (MULTIVITAMIN WITH MINERALS) tablet Take 1 tablet by mouth  every morning.     mupirocin nasal ointment (BACTROBAN) 2 % Place 1 application into the nose 2 (two) times daily. Use as directed by ENT 10 g 11   Omega-3 Fatty Acids (EQL OMEGA 3 FISH OIL) 1400 MG CAPS Take 2 capsules by mouth daily.     omeprazole (PRILOSEC) 20 MG capsule Take 20 mg by mouth every morning.     OZEMPIC, 1 MG/DOSE, 2 MG/1.5ML SOPN Inject 1 mg into the skin once a week. 4.5 mL 1   predniSONE (DELTASONE) 20 MG tablet Take 2 tablets (40 mg total) by mouth daily with breakfast for 5 days. 10 tablet 0   SYNJARDY XR 12.08-998 MG TB24 Take 1 tablet by mouth 2 (two) times daily.  5   Triamcinolone Acetonide (NASACORT ALLERGY 24HR NA) Place into the nose.     Turmeric, Curcuma Longa, (TURMERIC ROOT) POWD Take by mouth. TURMERIC ROOT EXTRACT     UNABLE TO FIND Med Name: Cinnamon/Cinsilin     VITAMIN E PO Take by mouth.     No current facility-administered medications for this visit.    Allergies: Codeine, Penicillins, Benzonatate, and Epinephrine  Past Medical History:  Diagnosis Date   Anxiety    Arthritis    hands  Diabetes mellitus without complication ([place])    type 2   Family history of adverse reaction to anesthesia    son has malignant hyperthermia, daughter does not daughter recently had c section without problems   GERD (gastroesophageal reflux disease)    Headache    sinus   Hypertension    PONV (postoperative nausea and vomiting)    nausea only   Ulcer, stomach peptic yrs ago    Past Surgical History:  Procedure Laterality Date   both hells bone spur repair     both heels with metal clips   both shoulder rotator cuff repair     CESAREAN SECTION     x 1   COLONOSCOPY WITH PROPOFOL N/A 09/07/2016   Procedure: COLONOSCOPY WITH PROPOFOL;  Surgeon: Garlan Fair, MD;  Location: [facility];  Service: Endoscopy;  Laterality: N/A;   colonscopy  06/2011   polyps   VESICO-VAGINAL FISTULA REPAIR      Family History  Problem Relation Age of Onset    Hypertension Mother    COPD Mother    Cancer Mother    Arthritis Mother    Hypertension Father    Hyperlipidemia Father    Diabetes Father    Cancer Father    Alcohol abuse Father    Diabetes Brother    Alcohol abuse Brother    Hypertension Brother    Arthritis Maternal Grandmother    Birth defects Maternal Grandmother    Diabetes Paternal Grandmother    Heart disease Paternal Grandfather     Social History   Tobacco Use   Smoking status: Former    Packs/day: 1.00    Years: 14.00    Pack years: 14.00    Types: Cigarettes   Smokeless tobacco: Never   Tobacco comments:    quit 31 yrs ago  Substance Use Topics   Alcohol use: Yes    Comment: rare    Subjective:  Was seen earlier this week with cough/ congestion; started on prednisone on Tuesday; felt better for 48 hours but woke up this morning with left ear "extremely full"/ painful;  Has struggled with recurrent sinus issues since December; does have ENT in [facility]; has taken 2 rounds of Doxycycline in December/ January;     Objective:  Vitals:   06/18/21 1133  BP: (!) 153/54  Pulse: 94  Resp: 20  SpO2: 98%  Weight: 204 lb 6.4 oz (92.7 kg)  Height: 5\' 1"  (1.549 m)    General: Well developed, well nourished, in no acute distress  Skin : Warm and dry.  Head: Normocephalic and atraumatic  Eyes: Sclera and conjunctiva clear; pupils round and reactive to light; extraocular movements intact  Ears: External normal; canals clear; tympanic membranes normal  Oropharynx: Pink, supple. No suspicious lesions  Neck: Supple without thyromegaly, adenopathy  Lungs: Respirations unlabored; wheezing/ coarse breath sounds noted in all 4 lobes CVS exam: normal rate and regular rhythm.  Musculoskeletal: No deformities; no active joint inflammation  Extremities: No edema, cyanosis, clubbing  Vessels: Symmetric bilaterally  Neurologic: Alert and oriented; speech intact; face symmetrical; moves all extremities well; CNII-XII  intact without focal deficit   Assessment:  1. Ear pain, left   2. Wheezing   3. Cough, unspecified type   4. Recurrent sinusitis     Plan:  Will update CXR today; d/c Azithromycin and change to Levaquin; take 20 mg prednisone daily x 4 more days; She is actually able to schedule follow up with her ENT for  next Tuesday while in our office today;  This visit occurred during the SARS-CoV-2 public health emergency.  Safety protocols were in place, including screening questions prior to the visit, additional usage of staff PPE, and extensive cleaning of exam room while observing appropriate contact time as indicated for disinfecting solutions.    No follow-ups on file.  Orders Placed This Encounter  Procedures   Ambulatory referral to ENT    Referral Priority:   Routine    Referral Type:   Consultation    Referral Reason:   Specialty Services Required    Requested Specialty:   Otolaryngology    Number of Visits Requested:   1    Requested Prescriptions    No prescriptions requested or ordered in this encounter

## 2021-06-21 ENCOUNTER — Telehealth: Payer: Self-pay | Admitting: *Deleted

## 2021-06-21 NOTE — Telephone Encounter (Signed)
Spoke with patient and gave her results.  She states that she is better but not good.  Also the antibiotic she feels is working no fevers.  Her ears are still in pain but she will be going to ENT tomorrow.

## 2021-06-21 NOTE — Telephone Encounter (Signed)
-----   Message from Marrian Salvage, [place] sent at 06/21/2021 10:16 AM EST ----- Please call to check on her; please keep ENT appointment for tomorrow; CXR is clear.

## 2021-06-22 DIAGNOSIS — J0101 Acute recurrent maxillary sinusitis: Secondary | ICD-10-CM | POA: Diagnosis not present

## 2021-06-23 ENCOUNTER — Other Ambulatory Visit: Payer: Self-pay | Admitting: Family

## 2021-06-23 ENCOUNTER — Encounter: Payer: Self-pay | Admitting: Family

## 2021-06-23 MED ORDER — NYSTATIN 100000 UNIT/ML MT SUSP: 5.0000 mL | Freq: Four times a day (QID) | OROMUCOSAL | 0 refills | Status: DC

## 2021-06-29 ENCOUNTER — Other Ambulatory Visit: Payer: Self-pay | Admitting: Family

## 2021-06-29 MED ORDER — NYSTATIN 100000 UNIT/ML MT SUSP: 5.0000 mL | Freq: Four times a day (QID) | OROMUCOSAL | 0 refills | Status: DC

## 2021-07-13 DIAGNOSIS — J329 Chronic sinusitis, unspecified: Secondary | ICD-10-CM | POA: Diagnosis not present

## 2021-07-13 DIAGNOSIS — J0101 Acute recurrent maxillary sinusitis: Secondary | ICD-10-CM | POA: Diagnosis not present

## 2021-07-14 ENCOUNTER — Telehealth: Payer: Self-pay | Admitting: Gastroenterology

## 2021-07-14 NOTE — Telephone Encounter (Signed)
Good afternoon Dr. Ardis Hughs, ? ?(DoD for 07/14/21, p.m.) ? ?This patient is requesting a transfer of care to our practice as her former gastroenterologist (Dr. Earle Gell) has since retired.  Her primary doctor is now through the [place] and she wishes to stay within the [place]. The procedure report from Dr. Wynetta Emery is in Epic for your review.  Please let me know if you approve the transfer. ? ?Thank you. ?

## 2021-07-26 DIAGNOSIS — H9042 Sensorineural hearing loss, unilateral, left ear, with unrestricted hearing on the contralateral side: Secondary | ICD-10-CM | POA: Insufficient documentation

## 2021-07-26 DIAGNOSIS — H903 Sensorineural hearing loss, bilateral: Secondary | ICD-10-CM | POA: Diagnosis not present

## 2021-07-26 DIAGNOSIS — H9202 Otalgia, left ear: Secondary | ICD-10-CM | POA: Diagnosis not present

## 2021-07-26 DIAGNOSIS — H9312 Tinnitus, left ear: Secondary | ICD-10-CM | POA: Diagnosis not present

## 2021-07-30 DIAGNOSIS — L821 Other seborrheic keratosis: Secondary | ICD-10-CM | POA: Diagnosis not present

## 2021-07-30 DIAGNOSIS — D225 Melanocytic nevi of trunk: Secondary | ICD-10-CM | POA: Diagnosis not present

## 2021-07-30 DIAGNOSIS — L578 Other skin changes due to chronic exposure to nonionizing radiation: Secondary | ICD-10-CM | POA: Diagnosis not present

## 2021-07-30 DIAGNOSIS — L814 Other melanin hyperpigmentation: Secondary | ICD-10-CM | POA: Diagnosis not present

## 2021-08-11 DIAGNOSIS — H43811 Vitreous degeneration, right eye: Secondary | ICD-10-CM | POA: Diagnosis not present

## 2021-08-13 ENCOUNTER — Telehealth: Payer: Self-pay | Admitting: Family

## 2021-08-13 NOTE — Telephone Encounter (Signed)
Left message for patient to call back and schedule Medicare Annual Wellness Visit (AWV).   Please offer to do virtually or by telephone.  Left office number and my jabber #[phone].  AWVI eligible as of 11/30/2020  Please schedule at anytime with Nurse Health Advisor.  

## 2021-08-17 ENCOUNTER — Telehealth (HOSPITAL_BASED_OUTPATIENT_CLINIC_OR_DEPARTMENT_OTHER): Payer: Self-pay

## 2021-08-17 ENCOUNTER — Ambulatory Visit (INDEPENDENT_AMBULATORY_CARE_PROVIDER_SITE_OTHER): Payer: Medicare Other | Admitting: Family

## 2021-08-17 VITALS — BP 140/80 | HR 94 | Temp 97.6°F | Resp 18 | Ht 61.0 in | Wt 206.2 lb

## 2021-08-17 DIAGNOSIS — E119 Type 2 diabetes mellitus without complications: Secondary | ICD-10-CM | POA: Diagnosis not present

## 2021-08-17 DIAGNOSIS — R519 Headache, unspecified: Secondary | ICD-10-CM | POA: Diagnosis not present

## 2021-08-17 DIAGNOSIS — J309 Allergic rhinitis, unspecified: Secondary | ICD-10-CM | POA: Diagnosis not present

## 2021-08-17 LAB — COMPREHENSIVE METABOLIC PANEL
ALT: 44 U/L — ABNORMAL HIGH (ref 0–35)
AST: 23 U/L (ref 0–37)
Albumin: 4.5 g/dL (ref 3.5–5.2)
Alkaline Phosphatase: 62 U/L (ref 39–117)
BUN: 16 mg/dL (ref 6–23)
CO2: 25 mEq/L (ref 19–32)
Calcium: 9.4 mg/dL (ref 8.4–10.5)
Chloride: 101 mEq/L (ref 96–112)
Creatinine, Ser: 0.52 mg/dL (ref 0.40–1.20)
GFR: 96.63 mL/min (ref >60.00)
Glucose, Bld: 179 mg/dL — ABNORMAL HIGH (ref 70–99)
Potassium: 4.3 mEq/L (ref 3.5–5.1)
Sodium: 138 mEq/L (ref 135–145)
Total Bilirubin: 0.5 mg/dL (ref 0.2–1.2)
Total Protein: 7 g/dL (ref 6.0–8.3)

## 2021-08-17 LAB — HEMOGLOBIN A1C: Hgb A1c MFr Bld: 8.1 % — ABNORMAL HIGH (ref 4.6–6.5)

## 2021-08-17 LAB — CBC WITH DIFFERENTIAL/PLATELET
Basophils Absolute: 0.1 10*3/uL (ref 0.0–0.1)
Basophils Relative: 1 % (ref 0.0–3.0)
Eosinophils Absolute: 0.3 10*3/uL (ref 0.0–0.7)
Eosinophils Relative: 4.9 % (ref 0.0–5.0)
HCT: 38.8 % (ref 36.0–46.0)
Hemoglobin: 12.5 g/dL (ref 12.0–15.0)
Lymphocytes Relative: 30.5 % (ref 12.0–46.0)
Lymphs Abs: 1.7 10*3/uL (ref 0.7–4.0)
MCHC: 32.2 g/dL (ref 30.0–36.0)
MCV: 80.6 fl (ref 78.0–100.0)
Monocytes Absolute: 0.4 10*3/uL (ref 0.1–1.0)
Monocytes Relative: 7.6 % (ref 3.0–12.0)
Neutro Abs: 3.2 10*3/uL (ref 1.4–7.7)
Neutrophils Relative %: 56 % (ref 43.0–77.0)
Platelets: 233 10*3/uL (ref 150.0–400.0)
RBC: 4.82 Mil/uL (ref 3.87–5.11)
RDW: 16.2 % — ABNORMAL HIGH (ref 11.5–15.5)
WBC: 5.7 10*3/uL (ref 4.0–10.5)

## 2021-08-17 MED ORDER — OZEMPIC (1 MG/DOSE) 2 MG/1.5ML ~~LOC~~ SOPN: 1.0000 mg | PEN_INJECTOR | SUBCUTANEOUS | 1 refills | Status: DC

## 2021-08-17 MED ORDER — ATORVASTATIN CALCIUM 40 MG PO TABS: 40.0000 mg | ORAL_TABLET | Freq: Every evening | ORAL | 0 refills | Status: DC

## 2021-08-17 MED ORDER — LORATADINE 10 MG PO TABS
10.0000 mg | ORAL_TABLET | Freq: Every day | ORAL | 0 refills | Status: DC
Start: 2021-08-17 — End: 2021-08-19

## 2021-08-17 MED ORDER — FEXOFENADINE HCL 180 MG PO TABS: 180.0000 mg | ORAL_TABLET | Freq: Every day | ORAL | 1 refills | Status: DC

## 2021-08-17 MED ORDER — SYNJARDY XR 12.5-1000 MG PO TB24: 1.0000 | ORAL_TABLET | Freq: Two times a day (BID) | ORAL | 5 refills | Status: DC

## 2021-08-17 MED ORDER — MONTELUKAST SODIUM 10 MG PO TABS: 10.0000 mg | ORAL_TABLET | Freq: Every day | ORAL | 0 refills | Status: DC

## 2021-08-17 MED ORDER — LISINOPRIL 5 MG PO TABS: 5.0000 mg | ORAL_TABLET | Freq: Two times a day (BID) | ORAL | 0 refills | Status: DC

## 2021-08-17 MED ORDER — CELECOXIB 200 MG PO CAPS: 200.0000 mg | ORAL_CAPSULE | Freq: Every day | ORAL | 0 refills | Status: DC

## 2021-08-17 NOTE — Progress Notes (Signed)
?Veronica Galvan is a 66 y.o. female with the following history as recorded in EpicCare:  ?Patient Active Problem List  ? Diagnosis Date Noted  ? Primary hypertension 01/12/2021  ? Hyperlipidemia associated with type 2 diabetes mellitus ([place]) 01/12/2021  ? Arthritis 01/12/2021  ? Type II diabetes mellitus ([place]) 11/25/2019  ? Ingrown toenail 09/05/2018  ? Neck pain 01/29/2018  ? Tick bite 10/24/2017  ?  ?Current Outpatient Medications  ?Medication Sig Dispense Refill  ? albuterol (VENTOLIN HFA) 108 (90 Base) MCG/ACT inhaler Inhale 2 puffs into the lungs every 6 (six) hours as needed for wheezing or shortness of breath. 8 g 0  ? Ascorbic Acid (VITAMIN C) 1000 MG tablet Take 1,000 mg by mouth every morning.    ? aspirin EC 81 MG tablet Take 81 mg by mouth every morning.    ? Calcium Carb-Cholecalciferol (CALCIUM 600+D3 PO) Take 1 tablet by mouth every morning.    ? Cholecalciferol (VITAMIN D) 2000 units tablet Take 2,000 Units by mouth daily.    ? Coenzyme Q10 300 MG CAPS Take 1 capsule by mouth every morning.    ? diclofenac Sodium (VOLTAREN) 1 % GEL Apply topically 4 (four) times daily.    ? fexofenadine (ALLEGRA) 180 MG tablet Take 1 tablet (180 mg total) by mouth daily. 90 tablet 1  ? hydrOXYzine (ATARAX) 10 MG tablet Take 10 mg by mouth 3 (three) times daily as needed.    ? LUTEIN-ZEAXANTHIN PO Take 1 tablet by mouth every morning.    ? Mometasone Furoate POWD     ? Multiple Vitamins-Minerals (MULTIVITAMIN WITH MINERALS) tablet Take 1 tablet by mouth every morning.    ? mupirocin nasal ointment (BACTROBAN) 2 % Place 1 application into the nose 2 (two) times daily. Use as directed by ENT 10 g 11  ? Omega-3 Fatty Acids (EQL OMEGA 3 FISH OIL) 1400 MG CAPS Take 2 capsules by mouth daily.    ? omeprazole (PRILOSEC) 20 MG capsule Take 20 mg by mouth every morning.    ? Tobramycin Sulfate POWD     ? Triamcinolone Acetonide (NASACORT ALLERGY 24HR NA) Place into the nose.    ? Turmeric, Curcuma Longa, (TURMERIC ROOT)  POWD Take by mouth. TURMERIC ROOT EXTRACT    ? UNABLE TO FIND Med Name: Cinnamon/Cinsilin    ? VITAMIN E PO Take by mouth.    ? atorvastatin (LIPITOR) 40 MG tablet Take 1 tablet (40 mg total) by mouth every evening. 90 tablet 0  ? celecoxib (CELEBREX) 200 MG capsule Take 1 capsule (200 mg total) by mouth daily. 90 capsule 0  ? lisinopril (ZESTRIL) 5 MG tablet Take 1 tablet (5 mg total) by mouth 2 (two) times daily. 90 tablet 0  ? loratadine (CLARITIN) 10 MG tablet Take 1 tablet (10 mg total) by mouth daily. 90 tablet 0  ? montelukast (SINGULAIR) 10 MG tablet Take 1 tablet (10 mg total) by mouth at bedtime. 90 tablet 0  ? OZEMPIC, 1 MG/DOSE, 2 MG/1.5ML SOPN Inject 1 mg into the skin once a week. 4.5 mL 1  ? SYNJARDY XR 12.08-998 MG TB24 Take 1 tablet by mouth 2 (two) times daily. 30 tablet 5  ? ?No current facility-administered medications for this visit.  ?  ?Allergies: Codeine, Penicillins, Benzonatate, and Epinephrine  ?Past Medical History:  ?Diagnosis Date  ? Anxiety   ? 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 Diabetes mellitus without complication ([place])   ? type 2  ? Family history of  adverse reaction to anesthesia   ? son has malignant hyperthermia, daughter does not daughter recently had c section without problems  ? GERD (gastroesophageal reflux disease)   ? Headache   ? sinus  ? Hypertension   ? PONV (postoperative nausea and vomiting)   ? nausea only  ? Ulcer, stomach peptic yrs ago  ?  ?Past Surgical History:  ?Procedure Laterality Date  ? both hells bone spur repair    ? both heels with metal clips  ? both shoulder rotator cuff repair    ? CESAREAN SECTION    ? x 1  ? COLONOSCOPY WITH PROPOFOL N/A 09/07/2016  ? Procedure: COLONOSCOPY WITH PROPOFOL;  Surgeon: Garlan Fair, MD;  Location: [facility];  Service: Endoscopy;  Laterality: N/A;  ? colonscopy  06/2011  ? polyps  ? VESICO-VAGINAL FISTULA REPAIR    ?  ?Family History  ?Problem Relation Age of Onset  ? Hypertension Mother   ? COPD Mother   ? Cancer Mother   ?  Arthritis Mother   ? Hypertension Father   ? Hyperlipidemia Father   ? Diabetes Father   ? Cancer Father   ? Alcohol abuse Father   ? Diabetes Brother   ? Alcohol abuse Brother   ? Hypertension Brother   ? Arthritis Maternal Grandmother   ? Birth defects Maternal Grandmother   ? Diabetes Paternal Grandmother   ? Heart disease Paternal Grandfather   ?  ?Social History  ? ?Tobacco Use  ? Smoking status: Former  ?  Packs/day: 1.00  ?  Years: 14.00  ?  Pack years: 14.00  ?  Types: Cigarettes  ? Smokeless tobacco: Never  ? Tobacco comments:  ?  quit 31 yrs ago  ?Substance Use Topics  ? Alcohol use: Yes  ?  Comment: rare  ?  ?Subjective:  ?4 month follow up on Type 2 Diabetes; is concerned about control- has had to take a lot of prednisone in the past few months for chronic sinus issues; ?Notes that she has struggled with chronic headache x "months." ENT does not feel sinus source; patient frustrated that she is not getting any answers/ relief;  ? ? ? ?Objective:  ?Vitals:  ? 08/17/21 0836  ?BP: 140/80  ?Pulse: 94  ?Resp: 18  ?Temp: 97.6 ?F (36.4 ?C)  ?TempSrc: Oral  ?SpO2: 99%  ?Weight: 206 lb 3.2 oz (93.5 kg)  ?Height: 5' 1"  (1.549 m)  ?  ?General: Well developed, well nourished, in no acute distress  ?Skin : Warm and dry.  ?Head: Normocephalic and atraumatic  ?Eyes: Sclera and conjunctiva clear; pupils round and reactive to light; extraocular movements intact  ?Ears: External normal; canals clear; tympanic membranes normal  ?Oropharynx: Pink, supple. No suspicious lesions  ?Neck: Supple without thyromegaly, adenopathy  ?Lungs: Respirations unlabored; clear to auscultation bilaterally without wheeze, rales, rhonchi  ?CVS exam: normal rate and regular rhythm.  ?Musculoskeletal: No deformities; no active joint inflammation  ?Extremities: No edema, cyanosis, clubbing  ?Vessels: Symmetric bilaterally  ?Neurologic: Alert and oriented; speech intact; face symmetrical; moves all extremities well; CNII-XII intact without focal  deficit  ? ?Assessment:  ?1. Type 2 diabetes mellitus without complication, unspecified whether long term insulin use ([place])   ?2. Chronic daily headache   ?3. Chronic allergic rhinitis   ?  ?Plan:  ?? Control due to recent use of steroids for chronic period of time; update labs today; will adjust medications as needed; ?? TMJ contributing- she will discuss with her  dentist; brain MRI ordered- she will get this if no source found with dentist; ?Change to Fexofenadine- stop Loratidine; referral to allergist;  ? ?This visit occurred during the SARS-CoV-2 public health emergency.  Safety protocols were in place, including screening questions prior to the visit, additional usage of staff PPE, and extensive cleaning of exam room while observing appropriate contact time as indicated for disinfecting solutions.  ? ? ?Return in about 3 months (around 11/16/2021).  ?Orders Placed This Encounter  ?Procedures  ? MR Brain Wo Contrast  ?  Standing Status:   Future  ?  Standing Expiration Date:   08/18/2022  ?  Order Specific Question:   What is the patient's sedation requirement?  ?  Answer:   No Sedation  ?  Order Specific Question:   Does the patient have a pacemaker or implanted devices?  ?  Answer:   No  ?  Order Specific Question:   Preferred imaging location?  ?  Answer:   [facility] (table limit 350lbs)  ?  Order Specific Question:   Release to patient  ?  Answer:   Immediate  ? CBC with Differential/Platelet  ? Comp Met (CMET)  ? Hemoglobin A1c  ? Ambulatory referral to Allergy  ?  Referral Priority:   Routine  ?  Referral Type:   Allergy Testing  ?  Referral Reason:   Specialty Services Required  ?  Requested Specialty:   Allergy  ?  Number of Visits Requested:   1  ?  ?Requested Prescriptions  ? ?Signed Prescriptions Disp Refills  ? atorvastatin (LIPITOR) 40 MG tablet 90 tablet 0  ?  Sig: Take 1 tablet (40 mg total) by mouth every evening.  ? celecoxib (CELEBREX) 200 MG capsule 90 capsule 0  ?  Sig: Take 1  capsule (200 mg total) by mouth daily.  ? SYNJARDY XR 12.08-998 MG TB24 30 tablet 5  ?  Sig: Take 1 tablet by mouth 2 (two) times daily.  ? lisinopril (ZESTRIL) 5 MG tablet 90 tablet 0  ?  Sig: Take 1 tablet (

## 2021-08-18 DIAGNOSIS — L814 Other melanin hyperpigmentation: Secondary | ICD-10-CM | POA: Insufficient documentation

## 2021-08-18 DIAGNOSIS — D225 Melanocytic nevi of trunk: Secondary | ICD-10-CM | POA: Insufficient documentation

## 2021-08-18 DIAGNOSIS — Z85828 Personal history of other malignant neoplasm of skin: Secondary | ICD-10-CM | POA: Insufficient documentation

## 2021-08-18 DIAGNOSIS — L719 Rosacea, unspecified: Secondary | ICD-10-CM | POA: Insufficient documentation

## 2021-08-18 DIAGNOSIS — D239 Other benign neoplasm of skin, unspecified: Secondary | ICD-10-CM | POA: Insufficient documentation

## 2021-08-18 DIAGNOSIS — I8393 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: Secondary | ICD-10-CM | POA: Insufficient documentation

## 2021-08-18 DIAGNOSIS — L570 Actinic keratosis: Secondary | ICD-10-CM | POA: Insufficient documentation

## 2021-08-18 DIAGNOSIS — D1730 Benign lipomatous neoplasm of skin and subcutaneous tissue of unspecified sites: Secondary | ICD-10-CM | POA: Insufficient documentation

## 2021-08-19 ENCOUNTER — Telehealth (HOSPITAL_BASED_OUTPATIENT_CLINIC_OR_DEPARTMENT_OTHER): Payer: Self-pay

## 2021-08-19 ENCOUNTER — Ambulatory Visit (INDEPENDENT_AMBULATORY_CARE_PROVIDER_SITE_OTHER): Payer: Medicare Other

## 2021-08-19 VITALS — Ht 61.0 in | Wt 202.6 lb

## 2021-08-19 DIAGNOSIS — Z0000 Encounter for general adult medical examination without abnormal findings: Secondary | ICD-10-CM | POA: Diagnosis not present

## 2021-08-19 NOTE — Progress Notes (Signed)
? ?Subjective:  ? Veronica Galvan is a 66 y.o. female who presents for an Initial Medicare Annual Wellness Visit. ?Virtual Visit via Telephone Note ? ?I connected with  [name] on 08/19/21 at  9:30 AM EDT by telephone and verified that I am speaking with the correct person using two identifiers. ? ?Location: ?Patient: HOME ?Provider: [facility] ?Persons participating in the virtual visit: patient/Nurse Health Advisor ?  ?I discussed the limitations, risks, security and privacy concerns of performing an evaluation and management service by telephone and the availability of in person appointments. The patient expressed understanding and agreed to proceed. ? ?Interactive audio and video telecommunications were attempted between this nurse and patient, however failed, due to patient having technical difficulties OR patient did not have access to video capability.  We continued and completed visit with audio only. ? ?Some vital signs may be absent or patient reported.  ? ?Chriss Driver, LPN ? ?Review of Systems    ? ?Cardiac Risk Factors include: advanced age (>67mn, >>2women);diabetes mellitus;hypertension;dyslipidemia;obesity (BMI >30kg/m2) ? ?   ?Objective:  ?  ?Today's Vitals  ? 08/19/21 0924  ?Weight: 202 lb 9.6 oz (91.9 kg)  ?Height: '5\' 1"'$  (1.549 m)  ? ?Body mass index is 38.28 kg/m?. ? ? ?  08/19/2021  ?  9:43 AM 08/25/2016  ? 11:43 AM  ?Advanced Directives  ?Does Patient Have a Medical Advance Directive? Yes Yes  ?Type of [place]  ?Does patient want to make changes to medical advance directive?  No - Patient declined  ?Copy of [place] Chart? No - copy requested   ? ? ?Current Medications (verified) ?Outpatient Encounter Medications as of 08/19/2021  ?Medication Sig  ? Ascorbic Acid (VITAMIN C) 1000 MG tablet Take 1,000 mg by mouth every morning.  ? aspirin EC 81 MG tablet Take 81 mg by  mouth every morning.  ? atorvastatin (LIPITOR) 40 MG tablet Take 1 tablet (40 mg total) by mouth every evening.  ? Calcium Carb-Cholecalciferol (CALCIUM 600+D3 PO) Take 1 tablet by mouth every morning.  ? celecoxib (CELEBREX) 200 MG capsule Take 1 capsule (200 mg total) by mouth daily.  ? Cholecalciferol (VITAMIN D) 2000 units tablet Take 2,000 Units by mouth daily.  ? Coenzyme Q10 300 MG CAPS Take 1 capsule by mouth every morning.  ? diclofenac Sodium (VOLTAREN) 1 % GEL Apply topically 4 (four) times daily.  ? fexofenadine (ALLEGRA) 180 MG tablet Take 1 tablet (180 mg total) by mouth daily.  ? hydrOXYzine (ATARAX) 10 MG tablet Take 10 mg by mouth 3 (three) times daily as needed.  ? lisinopril (ZESTRIL) 5 MG tablet Take 1 tablet (5 mg total) by mouth 2 (two) times daily.  ? LUTEIN-ZEAXANTHIN PO Take 1 tablet by mouth every morning.  ? Mometasone Furoate POWD   ? montelukast (SINGULAIR) 10 MG tablet Take 1 tablet (10 mg total) by mouth at bedtime.  ? Multiple Vitamins-Minerals (MULTIVITAMIN WITH MINERALS) tablet Take 1 tablet by mouth every morning.  ? mupirocin nasal ointment (BACTROBAN) 2 % Place 1 application into the nose 2 (two) times daily. Use as directed by ENT  ? Omega-3 Fatty Acids (EQL OMEGA 3 FISH OIL) 1400 MG CAPS Take 2 capsules by mouth daily.  ? omeprazole (PRILOSEC) 20 MG capsule Take 20 mg by mouth every morning.  ? OZEMPIC, 1 MG/DOSE, 2 MG/1.5ML SOPN Inject 1 mg into the skin once a  week.  ? SYNJARDY XR 12.08-998 MG TB24 Take 1 tablet by mouth 2 (two) times daily.  ? Tobramycin Sulfate POWD   ? Triamcinolone Acetonide (NASACORT ALLERGY 24HR NA) Place into the nose.  ? Turmeric, Curcuma [place]a, (TURMERIC ROOT) POWD Take by mouth. TURMERIC ROOT EXTRACT  ? UNABLE TO FIND Med Name: Cinnamon/Cinsilin  ? VITAMIN E PO Take by mouth.  ? [DISCONTINUED] albuterol (VENTOLIN HFA) 108 (90 Base) MCG/ACT inhaler Inhale 2 puffs into the lungs every 6 (six) hours as needed for wheezing or shortness of breath.  ?  [DISCONTINUED] loratadine (CLARITIN) 10 MG tablet Take 1 tablet (10 mg total) by mouth daily.  ? ?No facility-administered encounter medications on file as of 08/19/2021.  ? ? ?Allergies (verified) ?Codeine, Penicillins, Benzonatate, and Epinephrine  ? ?History: ?Past Medical History:  ?Diagnosis Date  ? Allergy   ? Anxiety   ? 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 Cataract   ? Diabetes mellitus without complication ([place])   ? type 2  ? Family history of adverse reaction to anesthesia   ? son has malignant hyperthermia, daughter does not daughter recently had c section without problems  ? GERD (gastroesophageal reflux disease)   ? Headache   ? sinus  ? Hyperlipidemia   ? Hypertension   ? PONV (postoperative nausea and vomiting)   ? nausea only  ? Ulcer, stomach peptic yrs ago  ? ?Past Surgical History:  ?Procedure Laterality Date  ? APPENDECTOMY    ? both hells bone spur repair    ? both heels with metal clips  ? both shoulder rotator cuff repair    ? CESAREAN SECTION    ? x 1  ? COLONOSCOPY WITH PROPOFOL N/A 09/07/2016  ? Procedure: COLONOSCOPY WITH PROPOFOL;  Surgeon: Garlan Fair, MD;  Location: [facility];  Service: Endoscopy;  Laterality: N/A;  ? colonscopy  06/2011  ? polyps  ? EYE SURGERY    ? FRACTURE SURGERY    ? TUBAL LIGATION    ? VESICO-VAGINAL FISTULA REPAIR    ? ?Family History  ?Problem Relation Age of Onset  ? Hypertension Mother   ? COPD Mother   ? Cancer Mother   ? Arthritis Mother   ? Hypertension Father   ? Hyperlipidemia Father   ? Diabetes Father   ? Cancer Father   ? Alcohol abuse Father   ? Diabetes Brother   ? Alcohol abuse Brother   ? Hypertension Brother   ? Arthritis Maternal Grandmother   ? Birth defects Maternal Grandmother   ? Diabetes Paternal Grandmother   ? Heart disease Paternal Grandfather   ? Diabetes Paternal Aunt   ? Diabetes Paternal Aunt   ? Obesity Son   ? ?Social History  ? ?Socioeconomic History  ? Marital status: Widowed  ?  Spouse name: Not on file  ? Number of children: Not on  file  ? Years of education: Not on file  ? Highest education level: Not on file  ?Occupational History  ? Not on file  ?Tobacco Use  ? Smoking status: Former  ?  Packs/day: 1.00  ?  Years: 14.00  ?  Pack years: 14.00  ?  Types: Cigarettes  ? Smokeless tobacco: Never  ? Tobacco comments:  ?  quit 31 yrs ago  ?Substance and Sexual Activity  ? Alcohol use: Yes  ?  Comment: rare  ? Drug use: No  ? Sexual activity: Not Currently  ?  Birth control/protection: Post-menopausal  ?Other Topics  Concern  ? Not on file  ?Social History Narrative  ? Not on file  ? ?Social Determinants of Health  ? ?Financial Resource Strain: Low Risk   ? Difficulty of Paying Living Expenses: Not hard at all  ?Food Insecurity: No Food Insecurity  ? Worried About Charity fundraiser in the Last Year: Never true  ? Ran Out of Food in the Last Year: Never true  ?Transportation Needs: No Transportation Needs  ? Lack of Transportation (Medical): No  ? Lack of Transportation (Non-Medical): No  ?Physical Activity: Sufficiently Active  ? Days of Exercise per Week: 5 days  ? Minutes of Exercise per Session: 60 min  ?Stress: No Stress Concern Present  ? Feeling of Stress : Not at all  ?Social Connections: Moderately Integrated  ? Frequency of Communication with Friends and Family: More than three times a week  ? Frequency of Social Gatherings with Friends and Family: More than three times a week  ? Attends Religious Services: More than 4 times per year  ? Active Member of Clubs or Organizations: Yes  ? Attends Archivist Meetings: More than 4 times per year  ? Marital Status: Widowed  ? ? ?Tobacco Counseling ?Counseling given: Not Answered ?Tobacco comments: quit 31 yrs ago ? ? ?Clinical Intake: ? ?Pre-visit preparation completed: Yes ? ?Pain : No/denies pain ? ?  ? ?BMI - recorded: 38.28 ?Nutritional Status: BMI 25 -29 Overweight ?Nutritional Risks: None ?Diabetes: Yes ? ?How often do you need to have someone help you when you read instructions,  pamphlets, or other written materials from your doctor or pharmacy?: 1 - Never ? ?Diabetic?Nutrition Risk Assessment: ? ?Has the patient had any N/V/D within the last 2 months?  No  ?Does the patient have a

## 2021-08-19 NOTE — Patient Instructions (Signed)
Veronica Galvan , ?Thank you for taking time to come for your Medicare Wellness Visit. I appreciate your ongoing commitment to your health goals. Please review the following plan we discussed and let me know if I can assist you in the future.  ? ?Screening recommendations/referrals: ?Colonoscopy: Done 09/17/2016 Repeat every 7 years.  ?Mammogram: Done 05/26/2021 Repeat annually ? ?Bone Density: Done 06/23/2020. Repeat every 2 years ? ?Recommended yearly ophthalmology/optometry visit for glaucoma screening and checkup ?Recommended yearly dental visit for hygiene and checkup ? ?Vaccinations: ?Influenza vaccine: Done 02/07/2021 Repeat annually ? ?Pneumococcal vaccine: Done 01/27/2020 and 04/16/2021 ?Tdap vaccine: Done 03/31/2016 Repeat in 10 years ? ?Shingles vaccine: Done 03/30/2015, 03/05/2019 and 05/24/2019   ?Covid-19:Done 03/19/2021, 02/23/2020, 08/02/19, 07/20/19 ? ?Advanced directives: Please bring a copy of your health care power of attorney and living will to the office to be added to your chart at your convenience. ? ? ?Conditions/risks identified: Aim for 30 minutes of exercise or brisk walking, 6-8 glasses of water, and 5 servings of fruits and vegetables each day. ?KEEP UP THE GOOD WORK!! ? ?Next appointment: Follow up in one year for your annual wellness visit 2024. ? ? ?Preventive Care 54 Years and Older, Female ?Preventive care refers to lifestyle choices and visits with your health care provider that can promote health and wellness. ?What does preventive care include? ?A yearly physical exam. This is also called an annual well check. ?Dental exams once or twice a year. ?Routine eye exams. Ask your health care provider how often you should have your eyes checked. ?Personal lifestyle choices, including: ?Daily care of your teeth and gums. ?Regular physical activity. ?Eating a healthy diet. ?Avoiding tobacco and drug use. ?Limiting alcohol use. ?Practicing safe sex. ?Taking low-dose aspirin every day. ?Taking vitamin  and mineral supplements as recommended by your health care provider. ?What happens during an annual well check? ?The services and screenings done by your health care provider during your annual well check will depend on your age, overall health, lifestyle risk factors, and family history of disease. ?Counseling  ?Your health care provider may ask you questions about your: ?Alcohol use. ?Tobacco use. ?Drug use. ?Emotional well-being. ?Home and relationship well-being. ?Sexual activity. ?Eating habits. ?History of falls. ?Memory and ability to understand (cognition). ?Work and work Statistician. ?Reproductive health. ?Screening  ?You may have the following tests or measurements: ?Height, weight, and BMI. ?Blood pressure. ?Lipid and cholesterol levels. These may be checked every 5 years, or more frequently if you are over 23 years old. ?Skin check. ?Lung cancer screening. You may have this screening every year starting at age 5 if you have a 30-pack-year history of smoking and currently smoke or have quit within the past 15 years. ?Fecal occult blood test (FOBT) of the stool. You may have this test every year starting at age 97. ?Flexible sigmoidoscopy or colonoscopy. You may have a sigmoidoscopy every 5 years or a colonoscopy every 10 years starting at age 28. ?Hepatitis C blood test. ?Hepatitis B blood test. ?Sexually transmitted disease (STD) testing. ?Diabetes screening. This is done by checking your blood sugar (glucose) after you have not eaten for a while (fasting). You may have this done every 1-3 years. ?Bone density scan. This is done to screen for osteoporosis. You may have this done starting at age 73. ?Mammogram. This may be done every 1-2 years. Talk to your health care provider about how often you should have regular mammograms. ?Talk with your health care provider about your test results, treatment  options, and if necessary, the need for more tests. ?Vaccines  ?Your health care provider may recommend  certain vaccines, such as: ?Influenza vaccine. This is recommended every year. ?Tetanus, diphtheria, and acellular pertussis (Tdap, Td) vaccine. You may need a Td booster every 10 years. ?Zoster vaccine. You may need this after age 52. ?Pneumococcal 13-valent conjugate (PCV13) vaccine. One dose is recommended after age 15. ?Pneumococcal polysaccharide (PPSV23) vaccine. One dose is recommended after age 82. ?Talk to your health care provider about which screenings and vaccines you need and how often you need them. ?This information is not intended to replace advice given to you by your health care provider. Make sure you discuss any questions you have with your health care provider. ?Document Released: 05/15/2015 Document Revised: 01/06/2016 Document Reviewed: 02/17/2015 ?Elsevier Interactive Patient Education ? 2017 [place]. ? ?Fall Prevention in the Home ?Falls can cause injuries. They can happen to people of all ages. There are many things you can do to make your home safe and to help prevent falls. ?What can I do on the outside of my home? ?Regularly fix the edges of walkways and driveways and fix any cracks. ?Remove anything that might make you trip as you walk through a door, such as a raised step or threshold. ?Trim any bushes or trees on the path to your home. ?Use bright outdoor lighting. ?Clear any walking paths of anything that might make someone trip, such as rocks or tools. ?Regularly check to see if handrails are loose or broken. Make sure that both sides of any steps have handrails. ?Any raised decks and porches should have guardrails on the edges. ?Have any leaves, snow, or ice cleared regularly. ?Use sand or salt on walking paths during winter. ?Clean up any spills in your garage right away. This includes oil or grease spills. ?What can I do in the bathroom? ?Use night lights. ?Install grab bars by the toilet and in the tub and shower. Do not use towel bars as grab bars. ?Use non-skid mats or  decals in the tub or shower. ?If you need to sit down in the shower, use a plastic, non-slip stool. ?Keep the floor dry. Clean up any water that spills on the floor as soon as it happens. ?Remove soap buildup in the tub or shower regularly. ?Attach bath mats securely with double-sided non-slip rug tape. ?Do not have throw rugs and other things on the floor that can make you trip. ?What can I do in the bedroom? ?Use night lights. ?Make sure that you have a light by your bed that is easy to reach. ?Do not use any sheets or blankets that are too big for your bed. They should not hang down onto the floor. ?Have a firm chair that has side arms. You can use this for support while you get dressed. ?Do not have throw rugs and other things on the floor that can make you trip. ?What can I do in the kitchen? ?Clean up any spills right away. ?Avoid walking on wet floors. ?Keep items that you use a lot in easy-to-reach places. ?If you need to reach something above you, use a strong step stool that has a grab bar. ?Keep electrical cords out of the way. ?Do not use floor polish or wax that makes floors slippery. If you must use wax, use non-skid floor wax. ?Do not have throw rugs and other things on the floor that can make you trip. ?What can I do with my stairs? ?Do not  leave any items on the stairs. ?Make sure that there are handrails on both sides of the stairs and use them. Fix handrails that are broken or loose. Make sure that handrails are as long as the stairways. ?Check any carpeting to make sure that it is firmly attached to the stairs. Fix any carpet that is loose or worn. ?Avoid having throw rugs at the top or bottom of the stairs. If you do have throw rugs, attach them to the floor with carpet tape. ?Make sure that you have a light switch at the top of the stairs and the bottom of the stairs. If you do not have them, ask someone to add them for you. ?What else can I do to help prevent falls? ?Wear shoes that: ?Do not  have high heels. ?Have rubber bottoms. ?Are comfortable and fit you well. ?Are closed at the toe. Do not wear sandals. ?If you use a stepladder: ?Make sure that it is fully opened. Do not climb a closed step

## 2021-08-24 ENCOUNTER — Encounter: Payer: Self-pay | Admitting: Family

## 2021-08-24 ENCOUNTER — Other Ambulatory Visit: Payer: Self-pay | Admitting: Family

## 2021-08-24 MED ORDER — ALPRAZOLAM 0.5 MG PO TABS: ORAL_TABLET | ORAL | 0 refills | Status: DC

## 2021-09-01 ENCOUNTER — Ambulatory Visit (HOSPITAL_BASED_OUTPATIENT_CLINIC_OR_DEPARTMENT_OTHER)
Admission: RE | Admit: 2021-09-01 | Discharge: 2021-09-01 | Disposition: A | Discharge status: Home / Self Care | Payer: Medicare Other | Source: Ambulatory Visit | Attending: Family | Admitting: Family

## 2021-09-01 DIAGNOSIS — R519 Headache, unspecified: Secondary | ICD-10-CM | POA: Diagnosis not present

## 2021-09-02 ENCOUNTER — Encounter: Payer: Self-pay | Admitting: Family

## 2021-09-10 DIAGNOSIS — H43811 Vitreous degeneration, right eye: Secondary | ICD-10-CM | POA: Diagnosis not present

## 2021-09-13 ENCOUNTER — Encounter: Payer: Self-pay | Admitting: Family

## 2021-09-13 NOTE — Telephone Encounter (Signed)
I have called the pt to follow up on how she is doing. Pt reports " I know my body and I have done a heart monitor years ago and I don't have atrial fibulation." "I believe its a delayed stress reactions and I don't want to run into a panic attack. " Pt is not willing to go to the [facility] or ED since she used her albuterol and it has helped somewhat. Pt has scheduled an appointment to see laura for tomorrow to follow up on her panic attacks and to talk about stress. She will go to somewhere tonight if she gets worse but she stated that she is okay for now.  ? ?FYI ?

## 2021-09-14 ENCOUNTER — Encounter: Payer: Self-pay | Admitting: Family

## 2021-09-14 ENCOUNTER — Ambulatory Visit (INDEPENDENT_AMBULATORY_CARE_PROVIDER_SITE_OTHER): Payer: Medicare Other | Admitting: Family

## 2021-09-14 VITALS — BP 148/80 | HR 94 | Resp 20 | Ht 61.0 in | Wt 203.4 lb

## 2021-09-14 DIAGNOSIS — J40 Bronchitis, not specified as acute or chronic: Secondary | ICD-10-CM | POA: Diagnosis not present

## 2021-09-14 DIAGNOSIS — F418 Other specified anxiety disorders: Secondary | ICD-10-CM | POA: Diagnosis not present

## 2021-09-14 DIAGNOSIS — Z87898 Personal history of other specified conditions: Secondary | ICD-10-CM

## 2021-09-14 DIAGNOSIS — J329 Chronic sinusitis, unspecified: Secondary | ICD-10-CM | POA: Diagnosis not present

## 2021-09-14 MED ORDER — ALPRAZOLAM 0.5 MG PO TABS: 0.5000 mg | ORAL_TABLET | Freq: Every evening | ORAL | 0 refills | Status: DC | PRN

## 2021-09-14 MED ORDER — PREDNISONE 20 MG PO TABS
20.0000 mg | ORAL_TABLET | Freq: Every day | ORAL | 0 refills | Status: DC
Start: 2021-09-14 — End: 2021-09-24

## 2021-09-14 MED ORDER — AZITHROMYCIN 250 MG PO TABS: ORAL_TABLET | ORAL | 0 refills | Status: DC

## 2021-09-14 NOTE — Progress Notes (Signed)
?Veronica Galvan is a 66 y.o. female with the following history as recorded in EpicCare:  ?Patient Active Problem List  ? Diagnosis Date Noted  ? 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 Dermatofibroma 08/18/2021  ? History of malignant neoplasm of skin 08/18/2021  ? Lentigo 08/18/2021  ? Melanocytic nevi of trunk 08/18/2021  ? 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  ? Rosacea 08/18/2021  ? Actinic keratosis 08/18/2021  ? Sensorineural hearing loss (SNHL) of left ear with unrestricted hearing of right ear 07/26/2021  ? Tinnitus of left ear 07/26/2021  ? Acute recurrent maxillary sinusitis 06/22/2021  ? Primary hypertension 01/12/2021  ? Hyperlipidemia associated with type 2 diabetes mellitus ([place]) 01/12/2021  ? Arthritis 01/12/2021  ? Type II diabetes mellitus ([place]) 11/25/2019  ? Ingrown toenail 09/05/2018  ? Neck pain 01/29/2018  ? Tick bite 10/24/2017  ?  ?Current Outpatient Medications  ?Medication Sig Dispense Refill  ? ALPRAZolam (XANAX) 0.5 MG tablet Take 1 tablet (0.5 mg total) by mouth at bedtime as needed for anxiety. 30 tablet 0  ? Ascorbic Acid (VITAMIN C) 1000 MG tablet Take 1,000 mg by mouth every morning.    ? aspirin EC 81 MG tablet Take 81 mg by mouth every morning.    ? atorvastatin (LIPITOR) 40 MG tablet Take 1 tablet (40 mg total) by mouth every evening. 90 tablet 0  ? azithromycin (ZITHROMAX) 250 MG tablet 2 tabs po qd x 1 day; 1 tablet per day x 4 days; 6 tablet 0  ? Calcium Carb-Cholecalciferol (CALCIUM 600+D3 PO) Take 1 tablet by mouth every morning.    ? celecoxib (CELEBREX) 200 MG capsule Take 1 capsule (200 mg total) by mouth daily. 90 capsule 0  ? Cholecalciferol (VITAMIN D) 2000 units tablet Take 2,000 Units by mouth daily.    ? Coenzyme Q10 300 MG CAPS Take 1 capsule by mouth every morning.    ? diclofenac Sodium (VOLTAREN) 1 % GEL Apply topically 4 (four) times daily.    ? fexofenadine (ALLEGRA) 180 MG tablet Take 1 tablet (180 mg total)  by mouth daily. 90 tablet 1  ? hydrOXYzine (ATARAX) 10 MG tablet Take 10 mg by mouth 3 (three) times daily as needed.    ? lisinopril (ZESTRIL) 5 MG tablet Take 1 tablet (5 mg total) by mouth 2 (two) times daily. 90 tablet 0  ? LUTEIN-ZEAXANTHIN PO Take 1 tablet by mouth every morning.    ? Mometasone Furoate POWD     ? montelukast (SINGULAIR) 10 MG tablet Take 1 tablet (10 mg total) by mouth at bedtime. 90 tablet 0  ? Multiple Vitamins-Minerals (MULTIVITAMIN WITH MINERALS) tablet Take 1 tablet by mouth every morning.    ? mupirocin nasal ointment (BACTROBAN) 2 % Place 1 application into the nose 2 (two) times daily. Use as directed by ENT 10 g 11  ? Omega-3 Fatty Acids (EQL OMEGA 3 FISH OIL) 1400 MG CAPS Take 2 capsules by mouth daily.    ? omeprazole (PRILOSEC) 20 MG capsule Take 20 mg by mouth every morning.    ? OZEMPIC, 1 MG/DOSE, 2 MG/1.5ML SOPN Inject 1 mg into the skin once a week. 4.5 mL 1  ? predniSONE (DELTASONE) 20 MG tablet Take 1 tablet (20 mg total) by mouth daily with breakfast. 5 tablet 0  ? SYNJARDY XR 12.08-998 MG TB24 Take 1 tablet by mouth 2 (two) times daily. 30 tablet 5  ? Tobramycin Sulfate POWD     ? Triamcinolone Acetonide (  NASACORT ALLERGY 24HR NA) Place into the nose.    ? Turmeric, Curcuma Longa, (TURMERIC ROOT) POWD Take by mouth. TURMERIC ROOT EXTRACT    ? UNABLE TO FIND Med Name: Cinnamon/Cinsilin    ? VITAMIN E PO Take by mouth.    ? ?No current facility-administered medications for this visit.  ?  ?Allergies: Codeine, Penicillins, Benzonatate, and Epinephrine  ?Past Medical History:  ?Diagnosis Date  ? Allergy   ? Anxiety   ? 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 Cataract   ? Diabetes mellitus without complication ([place])   ? type 2  ? Family history of adverse reaction to anesthesia   ? son has malignant hyperthermia, daughter does not daughter recently had c section without problems  ? GERD (gastroesophageal reflux disease)   ? Headache   ? sinus  ? Hyperlipidemia   ? Hypertension   ? PONV  (postoperative nausea and vomiting)   ? nausea only  ? Ulcer, stomach peptic yrs ago  ?  ?Past Surgical History:  ?Procedure Laterality Date  ? APPENDECTOMY    ? both hells bone spur repair    ? both heels with metal clips  ? both shoulder rotator cuff repair    ? CESAREAN SECTION    ? x 1  ? COLONOSCOPY WITH PROPOFOL N/A 09/07/2016  ? Procedure: COLONOSCOPY WITH PROPOFOL;  Surgeon: Garlan Fair, MD;  Location: [facility];  Service: Endoscopy;  Laterality: N/A;  ? colonscopy  06/2011  ? polyps  ? EYE SURGERY    ? FRACTURE SURGERY    ? TUBAL LIGATION    ? VESICO-VAGINAL FISTULA REPAIR    ?  ?Family History  ?Problem Relation Age of Onset  ? Hypertension Mother   ? COPD Mother   ? Cancer Mother   ? Arthritis Mother   ? Hypertension Father   ? Hyperlipidemia Father   ? Diabetes Father   ? Cancer Father   ? Alcohol abuse Father   ? Diabetes Brother   ? Alcohol abuse Brother   ? Hypertension Brother   ? Arthritis Maternal Grandmother   ? Birth defects Maternal Grandmother   ? Diabetes Paternal Grandmother   ? Heart disease Paternal Grandfather   ? Diabetes Paternal Aunt   ? Diabetes Paternal Aunt   ? Obesity Son   ?  ?Social History  ? ?Tobacco Use  ? Smoking status: Former  ?  Packs/day: 1.00  ?  Years: 14.00  ?  Pack years: 14.00  ?  Types: Cigarettes  ? Smokeless tobacco: Never  ? Tobacco comments:  ?  quit 31 yrs ago  ?Substance Use Topics  ? Alcohol use: Yes  ?  Comment: rare  ?  ?Subjective:  ?Patient sent an e-mail yesterday with concerns that her pulse rate was in the high 120s and was concerned her symptoms were related to anxiety; Our office called and asked her to be seen yesterday but patient refused saying she has worn heart monitor in the past and "knows her body." Is feeling better today; Does also note that Sunday night she was experiencing viral gastroenteritis- had vomited 4 times in about a 12 hour window; Does note that was using increased dosage of albuterol over the past few days as well;   ? ?She has a home cardiac monitor and repeatedly checked her rhythm and sinus tachycardia in the past 24 hours-no atrial fibrillation noted;  ? ?Notes that started with cold symptoms over a week ago and has progressed  into wheezing/ cough; scheduled to see allergist at the end of next week;  ? ? ? ? ?Objective:  ?Vitals:  ? 09/14/21 1128  ?BP: (!) 148/80  ?Pulse: 94  ?Resp: 20  ?SpO2: 97%  ?Weight: 203 lb 6.4 oz (92.3 kg)  ?Height: '5\' 1"'$  (1.549 m)  ?  ?General: Well developed, well nourished, in no acute distress  ?Skin : Warm and dry.  ?Head: Normocephalic and atraumatic  ?Eyes: Sclera and conjunctiva clear; pupils round and reactive to light; extraocular movements intact  ?Ears: External normal; canals clear; tympanic membranes normal  ?Oropharynx: Pink, supple. No suspicious lesions  ?Neck: Supple without thyromegaly, adenopathy  ?Lungs: Respirations unlabored; wheezing noted; ?CVS exam: normal rate and regular rhythm.  ?Neurologic: Alert and oriented; speech intact; face symmetrical; moves all extremities well; CNII-XII intact without focal deficit  ? ?Assessment:  ?1. Recurrent sinusitis   ?2. Wheezy bronchitis   ?3. Situational anxiety   ?4. History of tachycardia   ?  ?Plan:  ?& 2. Keep planned follow up with allergist; she has also been referred to [facility] for 2nd opinion; Rx for Z-pak and prednisone due to symptoms noted today;  ?3.   Agree to short term Rx for Xanax to use as needed for panic attack symptoms; ?4.   EKG shows sinus rhythm; per patient, this has been a normal pattern for her for many years; she has worn holter monitors in the past and has home monitor she uses when her pulse level goes up- no history of atrial fibrillation; does not want to pursue repeat cardiac testing at this time; agree to monitor for now;  ? ?No follow-ups on file.  ?Orders Placed This Encounter  ?Procedures  ? EKG 12-Lead  ?  ?Requested Prescriptions  ? ?Signed Prescriptions Disp Refills  ? azithromycin  (ZITHROMAX) 250 MG tablet 6 tablet 0  ?  Sig: 2 tabs po qd x 1 day; 1 tablet per day x 4 days;  ? predniSONE (DELTASONE) 20 MG tablet 5 tablet 0  ?  Sig: Take 1 tablet (20 mg total) by mouth daily with breakfast.  ? ALP

## 2021-09-24 ENCOUNTER — Ambulatory Visit: Payer: Medicare Other | Admitting: Internal Medicine

## 2021-09-24 ENCOUNTER — Encounter: Payer: Self-pay | Admitting: Internal Medicine

## 2021-09-24 VITALS — BP 124/72 | HR 92 | Temp 97.6°F | Resp 18 | Ht 61.0 in | Wt 201.2 lb

## 2021-09-24 DIAGNOSIS — B999 Unspecified infectious disease: Secondary | ICD-10-CM

## 2021-09-24 DIAGNOSIS — G8929 Other chronic pain: Secondary | ICD-10-CM

## 2021-09-24 DIAGNOSIS — J310 Chronic rhinitis: Secondary | ICD-10-CM

## 2021-09-24 DIAGNOSIS — R062 Wheezing: Secondary | ICD-10-CM | POA: Diagnosis not present

## 2021-09-24 DIAGNOSIS — Z880 Allergy status to penicillin: Secondary | ICD-10-CM

## 2021-09-24 DIAGNOSIS — R519 Headache, unspecified: Secondary | ICD-10-CM

## 2021-09-24 MED ORDER — LEVOCETIRIZINE DIHYDROCHLORIDE 5 MG PO TABS: 5.0000 mg | ORAL_TABLET | Freq: Every evening | ORAL | 5 refills | Status: DC

## 2021-09-24 NOTE — Progress Notes (Addendum)
NEW PATIENT Date of Service/Encounter:  09/24/21 Referring provider: Marrian Salvage,* Primary care provider: Marrian Salvage, [name]  Subjective:  Veronica Galvan is a 66 y.o. female with a PMHx of SNHL, hyperlipidemia presenting today for evaluation of chronic rhinitis and headaches. History obtained from: chart review and patient.   Chronic sinus pressure and headaches: had hx of sinus issues using sinus rinses with bactroban for years, but this worsened significantly starting in November 2022,  Current symptoms include: chronic headaches, multiple sinus infections, feels like brain fog constantly  She was dianogsed with sinus multiple sinus infections: Nov-tx with doxy, Dec-tx with doxy, Jan-tx with an antibiotic, February-levaquin which finally knocked it out, however, continues to have a headache and she feels it is a sinus headache, ears stay clogged and full, pressure around and behind eyes.  Using sinus rinses which help some. She has been evaluated by Dentist for TMJ.  On her panoramic dental Xray recently, noted some sinus congestion and was started on z-pack and prednisone for 5 days which helped. She does follow with ENT (Dr. Ilda Foil) who has put her on tobramycin and mometasone liquid to use in her sinus rinses.   She has had a normal sinus CT that did not show infection. Her ear has been painful and it cracks and pops.  ENT did diagnose her with SNHL which she was told was causing the pain. Has not yet seen a neurologist. She does have a history of migraines, but this does not feel like her typical migraine. Normal brain MRI on 09/01/21-  Hx of wheezing: She was wheezing during this last sinus infection.  Also coughing and feels it is a little hard to breath when this occurs.  She is using albuterol as needed which was given to her January during a sinus infection.  She feels it is helpful. She does not use it often, only twice during an infection and only once daily  for a few days.  She has had all of her childhood vaccines.  She has her pneumonia vaccine, Covid vaccines. She denies requiring hospitalization for an infection.   Treatments tried: switched from claritin to allegra, singulair, nasacort, sinus rinses with mometasone and tobramycin Previous allergy testing: no History of reflux/heartburn:  prilosec 20 mg daily  Multiple medication reactions - Penicillin- had a rash many years ago.  Has not tried this medication in years (greater than 30) - Codeine causes her to itch - Epinephrine when given at her dental office this causes her to have tachycardia and what feels like a panic attack  Past Medical History: Past Medical History:  Diagnosis Date   Allergy    Anxiety    Arthritis    hands   Cataract    Diabetes mellitus without complication ([place])    type 2   Family history of adverse reaction to anesthesia    son has malignant hyperthermia, daughter does not daughter recently had c section without problems   GERD (gastroesophageal reflux disease)    Headache    sinus   Hyperlipidemia    Hypertension    PONV (postoperative nausea and vomiting)    nausea only   Ulcer, stomach peptic yrs ago   Medication List:  Current Outpatient Medications  Medication Sig Dispense Refill   ALPRAZolam (XANAX) 0.5 MG tablet Take 1 tablet (0.5 mg total) by mouth at bedtime as needed for anxiety. 30 tablet 0   Ascorbic Acid (VITAMIN C) 1000 MG tablet Take 1,000 mg by  mouth every morning.     aspirin EC 81 MG tablet Take 81 mg by mouth every morning.     atorvastatin (LIPITOR) 40 MG tablet Take 1 tablet (40 mg total) by mouth every evening. 90 tablet 0   Calcium Carb-Cholecalciferol (CALCIUM 600+D3 PO) Take 1 tablet by mouth every morning.     celecoxib (CELEBREX) 200 MG capsule Take 1 capsule (200 mg total) by mouth daily. 90 capsule 0   Cholecalciferol (VITAMIN D) 2000 units tablet Take 2,000 Units by mouth daily.     Coenzyme Q10 300 MG CAPS Take 1  capsule by mouth every morning.     diclofenac Sodium (VOLTAREN) 1 % GEL Apply topically 4 (four) times daily.     fexofenadine (ALLEGRA) 180 MG tablet Take 1 tablet (180 mg total) by mouth daily. 90 tablet 1   lisinopril (ZESTRIL) 5 MG tablet Take 1 tablet (5 mg total) by mouth 2 (two) times daily. 90 tablet 0   LUTEIN-ZEAXANTHIN PO Take 1 tablet by mouth every morning.     Mometasone Furoate POWD      montelukast (SINGULAIR) 10 MG tablet Take 1 tablet (10 mg total) by mouth at bedtime. 90 tablet 0   Multiple Vitamins-Minerals (MULTIVITAMIN WITH MINERALS) tablet Take 1 tablet by mouth every morning.     Omega-3 Fatty Acids (EQL OMEGA 3 FISH OIL) 1400 MG CAPS Take 2 capsules by mouth daily.     omeprazole (PRILOSEC) 20 MG capsule Take 20 mg by mouth every morning.     OZEMPIC, 1 MG/DOSE, 2 MG/1.5ML SOPN Inject 1 mg into the skin once a week. 4.5 mL 1   SYNJARDY XR 12.08-998 MG TB24 Take 1 tablet by mouth 2 (two) times daily. 30 tablet 5   Tobramycin Sulfate POWD      Triamcinolone Acetonide (NASACORT ALLERGY 24HR NA) Place into the nose.     Turmeric, Curcuma Longa, (TURMERIC ROOT) POWD Take by mouth. TURMERIC ROOT EXTRACT     UNABLE TO FIND Med Name: Cinnamon/Cinsilin     VITAMIN E PO Take by mouth.     No current facility-administered medications for this visit.   Known Allergies:  Allergies  Allergen Reactions   Codeine Hives and Itching   Penicillins Hives and Itching   Benzonatate Rash   Epinephrine Palpitations   Past Surgical History: Past Surgical History:  Procedure Laterality Date   APPENDECTOMY     both hells bone spur repair     both heels with metal clips   both shoulder rotator cuff repair     CESAREAN SECTION     x 1   COLONOSCOPY WITH PROPOFOL N/A 09/07/2016   Procedure: COLONOSCOPY WITH PROPOFOL;  Surgeon: Garlan Fair, MD;  Location: [facility];  Service: Endoscopy;  Laterality: N/A;   colonscopy  06/2011   polyps   EYE SURGERY     FRACTURE SURGERY      TUBAL LIGATION     VESICO-VAGINAL FISTULA REPAIR     Family History: Family History  Problem Relation Age of Onset   Hypertension Mother    COPD Mother    Cancer Mother    Arthritis Mother    Hypertension Father    Hyperlipidemia Father    Diabetes Father    Cancer Father    Alcohol abuse Father    Diabetes Brother    Alcohol abuse Brother    Hypertension Brother    Arthritis Maternal Grandmother    Birth defects Maternal Grandmother  Diabetes Paternal Grandmother    Heart disease Paternal Grandfather    Diabetes Paternal Aunt    Diabetes Paternal Aunt    Obesity Son    Social History: Cj lives in a townhouse 9 years ago, no water damage, carpet floors, gas heating, central AC, no pets, dust mite protection on bed but not pillows, no smoke exposure.  She is retired.  Smoked from [phone] 1 pack/day.   ROS:  All other systems negative except as noted per HPI.  Objective:  Blood pressure 124/72, pulse 92, temperature 97.6 F (36.4 C), temperature source Temporal, resp. rate 18, height '5\' 1"'$  (1.549 m), weight 201 lb 3.2 oz (91.3 kg), SpO2 98 %. Body mass index is 38.02 kg/m. Physical Exam:  General Appearance:  Alert, cooperative, no distress, appears stated age  Head:  Normocephalic, without obvious abnormality, atraumatic  Eyes:  Conjunctiva clear, EOM's intact  Nose: Nares normal,  septum partially deviated, normal mucosa, and no visible anterior polyps  Throat: Lips, tongue normal; teeth and gums normal, normal posterior oropharynx  Neck: Supple, symmetrical  Lungs:   clear to auscultation bilaterally, Respirations unlabored, no coughing  Heart:  regular rate and rhythm and no murmur, Appears well perfused  Extremities: No edema  Skin: Skin color, texture, turgor normal, no rashes or lesions on visualized portions of skin  Neurologic: No gross deficits     Diagnostics: Spirometry:  Tracings reviewed. Her effort: Good reproducible efforts. FVC:  1.92L FEV1: 1.67L, 80% predicted  FEV1/FVC ratio: 110%  Interpretation: Nonobstructive pattern  Skin Testing: Environmental allergy panel and select foods.  Adequate controls. Results discussed with patient/family.  Airborne Adult Perc - 09/24/21 1453     Time Antigen Placed 1453    Allergen Manufacturer Lavella Hammock    Location Back    Number of Test 59    1. Control-Buffer 50% Glycerol Negative    2. Control-Histamine 1 mg/ml 3+    3. Albumin saline Negative    4. [place] Negative    5. [place] Negative    6. Johnson Negative    7. Winona Blue Negative    8. Meadow Fescue Negative    9. Perennial Rye Negative    10. Sweet Vernal Negative    11. Timothy Negative    12. Cocklebur Negative    13. Burweed Marshelder Negative    14. Ragweed, short Negative    15. Ragweed, Giant Negative    16. Plantain,  English Negative    17. Lamb's Quarters Negative    18. Sheep Sorrell Negative    19. Rough Pigweed Negative    20. Marsh Elder, Rough Negative    21. Mugwort, Common Negative    22. Ash mix Negative    23. Birch mix Negative    24. Beech American Negative    25. Box, Elder Negative    26. Cedar, red Negative    27. Cottonwood, [place] Negative    28. Elm mix Negative    29. Hickory Negative    30. Maple mix Negative    31. Oak, [place] mix Negative    32. Pecan Pollen Negative    33. Pine mix Negative    34. Sycamore Eastern Negative    35. Bryant, Black Pollen Negative    36. Alternaria alternata Negative    37. Cladosporium Herbarum Negative    38. Aspergillus mix Negative    39. Penicillium mix Negative    40. Bipolaris sorokiniana (Helminthosporium) Negative    41. Drechslera spicifera (Curvularia)  Negative    42. Mucor plumbeus Negative    43. Fusarium moniliforme Negative    44. Aureobasidium pullulans (pullulara) Negative    45. Rhizopus oryzae Negative    46. Botrytis cinera Negative    47. Epicoccum nigrum Negative    48. Phoma betae Negative    49.  Candida Albicans Negative    50. Trichophyton mentagrophytes Negative    51. Mite, D Farinae  5,000 AU/ml Negative    52. Mite, D Pteronyssinus  5,000 AU/ml Negative    53. Cat Hair 10,000 BAU/ml Negative    54.  Dog Epithelia Negative    55. Mixed Feathers Negative    56. Horse Epithelia Negative    57. Cockroach, German Negative    58. Mouse Negative    59. Tobacco Leaf Negative             Food Perc - 09/24/21 1453       Test Information   Time Antigen Placed 8916    Allergen Manufacturer Lavella Hammock    Location Back    Number of allergen test 10      Food   1. Peanut Negative    2. Soybean food Negative    3. Wheat, whole Negative    4. Sesame Negative    5. Milk, cow Negative    6. Egg White, chicken Negative    7. Casein Negative    8. Shellfish mix Negative    9. Fish mix Negative    10. Cashew Negative             Intradermal - 09/24/21 1538     Time Antigen Placed 1535    Allergen Manufacturer Lavella Hammock    Location Arm    Number of Test 15    Control Negative    Guatem[place] Negative    Johnson Negative    7 Grass 2+    Ragweed mix Negative    Weed mix Negative    Tree mix Negative    Mold 1 Negative    Mold 2 Negative    Mold 3 Negative    Mold 4 Negative    Cat Negative    Dog Negative    Cockroach Negative    Mite mix Negative             Allergy testing results were read and interpreted by myself, documented by clinical staff.  Assessment and Plan  Ms. Longie is a 66 year old female with past medical history of hypertension, diabetes, hyperlipidemia who presents with presentation of recurrent sinus infections and headaches since November 2022.  She has been evaluated by ENT with normal imaging of sinus CT and brain MRI.  She has had at least 5 or 6 rounds of antibiotics.  She has been using tobramycin  and mometasone in her sinus rinses. Her allergy testing today did not show significant allergies.  She was positive to an outdoor mold mix as  well as a grass mix on intradermal's.  However these are mostly seasonal allergens that would not likely explain her chronic symptoms since last winter.  Did discuss evaluation of her humoral immune system given the nature of her infections.  Also explored the possibility of migraine variant, and possible referral to neurology if lab work nonrevealing. She does report having some wheezing in the setting of these illnesses which does seem to respond to albuterol.  Her spirometry today did not show obstruction.  Discussed continued use of albuterol as needed.  She will  advise if needing more frequently than current use. She has a history of penicillin allergy, and we discussed penicillin testing followed by oral challenge given her low risk.  Since she is having frequent infections, it is important to determine if this remains an active allergy.  Chronic sinus pressure and headaches:  - allergy testing today was negative to environmental allergens on skin and intradermal testing showed borderline to mold 1 mix (outdoor molds) and grass mix; skin testing was also negative to most common food allergens - continue sinus rinses -do not add antibiotics or steroid (mometasone or tobramycin) - stop Nasacort - stop Singulair - switch to Xyzal 5 mg daily (use in place of Allegra) - allergen avoidance as below - labs today at [facility] to rule out any immune defects that might cause chronic sinus infections; will call once results - if labs non-revealing, consider referral to Neurology for chronic headaches  H/O Penicillin allergy: - please schedule follow-up appt at your convenience for penicillin testing followed by graded oral challenge if indicated - please refrain from taking any antihistamines at least 3 days prior to this appointment  - around 80% of individuals outgrow this allergy in ~ 10 years and carrying it as a diagnosis can prevent you from getting proper therapy if needed  Wheezing Spirometry  today reassuring Continue albuterol 2 puffs every 4-6 hours as needed If noting an increase in use, please schedule follow-up  Follow-up pending lab results It was such a pleasure meeting you today!   This note in its entirety was forwarded to the Provider who requested this consultation.  Thank you for your kind referral. I appreciate the opportunity to take part in [facility] care. Please do not hesitate to contact me with questions.  Sincerely,  Sigurd Sos, MD [facility] [place] [facility]

## 2021-09-24 NOTE — Patient Instructions (Addendum)
Chronic sinus pressure and headaches:  - allergy testing today was negative to environmental allergens on skin and intradermal testing showed borderline to mold 1 mix (outdoor molds) and grass; skin testing was also negative to most common food allergens - continue sinus rinses -do not add antibiotics or steroid (mometasone or tobramycin) - stop Nasacort - stop Singulair - switch to Xyzal 5 mg daily (use in place of Allegra) - allergen avoidance as below - labs today at [facility] to rule out any immune defects that might cause chronic sinus infections; will call once results - if labs non-revealing, consider referral to Neurology for chronic headaches  H/O Penicillin allergy: - please schedule follow-up appt at your convenience for penicillin testing followed by graded oral challenge if indicated - please refrain from taking any antihistamines at least 3 days prior to this appointment  - around 80% of individuals outgrow this allergy in ~ 10 years and carrying it as a diagnosis can prevent you from getting proper therapy if needed  Wheezing Spirometry today reassuring Continue albuterol 2 puffs every 4-6 hours as needed If noting an increase in use, please schedule follow-up  Follow-up pending lab results It was such a pleasure meeting you today!

## 2021-09-28 DIAGNOSIS — B999 Unspecified infectious disease: Secondary | ICD-10-CM | POA: Diagnosis not present

## 2021-10-01 ENCOUNTER — Ambulatory Visit: Payer: Medicare Other | Admitting: Podiatry

## 2021-10-01 DIAGNOSIS — E119 Type 2 diabetes mellitus without complications: Secondary | ICD-10-CM | POA: Diagnosis not present

## 2021-10-01 DIAGNOSIS — G5762 Lesion of plantar nerve, left lower limb: Secondary | ICD-10-CM | POA: Diagnosis not present

## 2021-10-01 DIAGNOSIS — M722 Plantar fascial fibromatosis: Secondary | ICD-10-CM | POA: Diagnosis not present

## 2021-10-02 LAB — STREP PNEUMONIAE 23 SEROTYPES IGG
Pneumo Ab Type 1*: 2.6 ug/mL (ref >1.3)
Pneumo Ab Type 12 (12F)*: <0.1 ug/mL — ABNORMAL LOW (ref >1.3)
Pneumo Ab Type 14*: 6.7 ug/mL (ref >1.3)
Pneumo Ab Type 17 (17F)*: 2.6 ug/mL (ref >1.3)
Pneumo Ab Type 19 (19F)*: 27.5 ug/mL (ref >1.3)
Pneumo Ab Type 2*: 9.4 ug/mL (ref >1.3)
Pneumo Ab Type 20*: >38.2 ug/mL (ref >1.3)
Pneumo Ab Type 22 (22F)*: 1 ug/mL — ABNORMAL LOW (ref >1.3)
Pneumo Ab Type 23 (23F)*: <0.1 ug/mL — ABNORMAL LOW (ref >1.3)
Pneumo Ab Type 26 (6B)*: 2.3 ug/mL (ref >1.3)
Pneumo Ab Type 3*: 0.8 ug/mL — ABNORMAL LOW (ref >1.3)
Pneumo Ab Type 34 (10A)*: 1.1 ug/mL — ABNORMAL LOW (ref >1.3)
Pneumo Ab Type 4*: 0.5 ug/mL — ABNORMAL LOW (ref >1.3)
Pneumo Ab Type 43 (11A)*: 0.2 ug/mL — ABNORMAL LOW (ref >1.3)
Pneumo Ab Type 5*: 1 ug/mL — ABNORMAL LOW (ref >1.3)
Pneumo Ab Type 51 (7F)*: 0.6 ug/mL — ABNORMAL LOW (ref >1.3)
Pneumo Ab Type 54 (15B)*: 2.7 ug/mL (ref >1.3)
Pneumo Ab Type 56 (18C)*: >8.1 ug/mL (ref >1.3)
Pneumo Ab Type 57 (19A)*: 12.3 ug/mL (ref >1.3)
Pneumo Ab Type 68 (9V)*: 2.1 ug/mL (ref >1.3)
Pneumo Ab Type 70 (33F)*: >10.1 ug/mL (ref >1.3)
Pneumo Ab Type 8*: >25.3 ug/mL (ref >1.3)
Pneumo Ab Type 9 (9N)*: 2.4 ug/mL (ref >1.3)

## 2021-10-02 LAB — LYMPH ENUMERATION, BASIC & NK CELLS
% CD 3 Pos. Lymph.: 83.1 % (ref 57.5–86.2)
% CD 4 Pos. Lymph.: 59.9 % — ABNORMAL HIGH (ref 30.8–58.5)
% NK (CD56/16): 7 % (ref 1.4–19.4)
Ab NK (CD56/16): 154 /uL (ref 24–406)
Absolute CD 3: 1828 /uL (ref 622–2402)
Absolute CD 4 Helper: 1318 /uL (ref 359–1519)
Basophils Absolute: 0.1 10*3/uL (ref 0.0–0.2)
Basos: 1 %
CD19 % B Cell: 10.2 % (ref 3.3–25.4)
CD19 Abs: 224 /uL (ref 12–645)
CD4/CD8 Ratio: 2.49 (ref 0.92–3.72)
CD8 % Suppressor T Cell: 24.1 % (ref 12.0–35.5)
CD8 T Cell Abs: 530 /uL (ref 109–897)
EOS (ABSOLUTE): 0.3 10*3/uL (ref 0.0–0.4)
Eos: 5 %
Hematocrit: 41.7 % (ref 34.0–46.6)
Hemoglobin: 13.3 g/dL (ref 11.1–15.9)
Immature Grans (Abs): 0 10*3/uL (ref 0.0–0.1)
Immature Granulocytes: 0 %
Lymphocytes Absolute: 2.2 10*3/uL (ref 0.7–3.1)
Lymphs: 30 %
MCH: 25.3 pg — ABNORMAL LOW (ref 26.6–33.0)
MCHC: 31.9 g/dL (ref 31.5–35.7)
MCV: 79 fL (ref 79–97)
Monocytes Absolute: 0.5 10*3/uL (ref 0.1–0.9)
Monocytes: 7 %
Neutrophils Absolute: 4.3 10*3/uL (ref 1.4–7.0)
Neutrophils: 57 %
Platelets: 331 10*3/uL (ref 150–450)
RBC: 5.26 x10E6/uL (ref 3.77–5.28)
RDW: 15.1 % (ref 11.7–15.4)
WBC: 7.4 10*3/uL (ref 3.4–10.8)

## 2021-10-02 LAB — IGG, IGA, IGM
IgA/Immunoglobulin A, Serum: 304 mg/dL (ref 87–352)
IgG (Immunoglobin G), Serum: 627 mg/dL (ref 586–1602)
IgM (Immunoglobulin M), Srm: 68 mg/dL (ref 26–217)

## 2021-10-02 LAB — DIPHTHERIA / TETANUS ANTIBODY PANEL
Diphtheria Ab: 1.11 IU/mL (ref <0.10)
Tetanus Ab, IgG: 4.8 IU/mL (ref <0.10)

## 2021-10-02 LAB — COMPLEMENT, TOTAL: Compl, Total (CH50): >60 U/mL (ref >41)

## 2021-10-04 ENCOUNTER — Encounter: Payer: Self-pay | Admitting: Family

## 2021-10-04 ENCOUNTER — Encounter: Payer: Self-pay | Admitting: Internal Medicine

## 2021-10-04 NOTE — Progress Notes (Signed)
Subjective: 66 year old female presents the office today for diabetic foot exam.  She states that she has been doing well she denies any open sores.  No claudication symptoms.  The plantar fasciitis has been doing well.  She is continue with supportive shoe gear which has been very helpful.  She does get some occasional burning to her left second through fourth toes and thinks that she may have a Morton's neuroma.  No injuries that she reports.  Objective: AAO x3, NAD DP/PT pulses palpable 2/4 bilaterally, CRT less than 3 seconds Sensation intact with Semmes Weinstein monofilament. Not able to palpate any areas of pinpoint tenderness.  There is some mild discomfort in the second and third interspaces and small palpable neuroma versus bursa is present.  No edema no erythema.  No area pinpoint tenderness.  No significant pain on the plantar fascia.  MMT 5/5. No pain with calf compression, swelling, warmth, erythema  Assessment: 66 year old female presents for diabetic foot exam, left foot neuroma, history of Plantar fasciitis  Plan: -All treatment options discussed with the patient including all alternatives, risks, complications.  -Diabetes standpoint she is doing good from her feet.  Continue daily foot inspection as well as glucose control. -Continue general stretching exercises for plantar fasciitis.  Discussed offloading with underlying shoes and good arch support. -Patient encouraged to call the office with any questions, concerns, change in symptoms.   Trula Slade DPM

## 2021-10-05 ENCOUNTER — Other Ambulatory Visit: Payer: Self-pay | Admitting: Family

## 2021-10-05 DIAGNOSIS — G8929 Other chronic pain: Secondary | ICD-10-CM

## 2021-10-27 ENCOUNTER — Encounter: Payer: Self-pay | Admitting: Family

## 2021-10-27 DIAGNOSIS — E119 Type 2 diabetes mellitus without complications: Secondary | ICD-10-CM

## 2021-10-27 MED ORDER — GLUCOSE BLOOD VI STRP: ORAL_STRIP | 12 refills | Status: DC

## 2021-10-27 NOTE — Telephone Encounter (Signed)
Rx was sent in. 

## 2021-11-04 ENCOUNTER — Other Ambulatory Visit: Payer: Self-pay | Admitting: Internal Medicine

## 2021-11-04 MED ORDER — AMOXICILLIN 400 MG/5ML PO SUSR
ORAL | 0 refills | Status: DC
Start: 2021-11-04 — End: 2022-02-17

## 2021-11-04 NOTE — Progress Notes (Unsigned)
Follow Up Note  RE: Veronica Galvan MRN: [id number] DOB: Nov 26, 1955 Date of Office Visit: 11/04/2021  Referring provider: No ref. provider found Primary care provider: Marrian Salvage, [name]  Chief Complaint: No chief complaint on file.  Assessment and Plan: [name] is a 66 y.o. female with: No problem-specific Assessment & Plan notes found for this encounter.  No follow-ups on file.  Plan: Challenge drug: *** Challenge as per protocol: {Blank single:19197::"Passed","Failed"} Total time: ***  For next 24 hours monitor for hives, swelling, shortness of breath and dizziness. If you see these symptoms, use Benadryl for mild symptoms and epinephrine for more severe symptoms and call 911.  If no adverse symptoms in the next 24 hours then will take off drug from allergy list.  History of Present Illness: I had the pleasure of seeing Veronica Galvan for a follow up visit at the [facility] on 11/04/2021. She is a 66 y.o. female, who is being followed for ***. Her previous allergy office visit was on *** with {Blank single:19197::"Dr. Kim","Dr. Kozlow","Dr. Lomasney","Dr. Bobbitt","Dr. Gallagher","Dr. Padgett","Anne Ambs, [name]","Dr. Jearline Hirschhorn"}. Today she is here for *** drug testing and challenge.   History of Reaction: - Penicillin- had a rash many years ago.  Has not tried this medication in years (greater than 30)  Interval History: Patient has not been ill, she has not had any accidental exposures to the culprit medication.   Recent/Current History: Pulmonary disease: {Blank single:19197::"yes","no"} Cardiac disease: {Blank single:19197::"yes","no"} Respiratory infection: {Blank single:19197::"yes","no"} Rash: {Blank single:19197::"yes","no"} Itch: {Blank single:19197::"yes","no"} Swelling: {Blank single:19197::"yes","no"} Cough: {Blank single:19197::"yes","no"} Shortness of breath: {Blank single:19197::"yes","no"} Runny/stuffy nose: {Blank  single:19197::"yes","no"} Itchy eyes: {Blank single:19197::"yes","no"} Beta-blocker use: {Blank single:19197::"yes","no"}  Patient/guardian was informed of the test procedure with verbalized understanding of the risk of anaphylaxis. Consent was signed.   Last antihistamine use: *** Last beta-blocker use: ***  Medication List:  Current Outpatient Medications  Medication Sig Dispense Refill   ALPRAZolam (XANAX) 0.5 MG tablet Take 1 tablet (0.5 mg total) by mouth at bedtime as needed for anxiety. 30 tablet 0   Ascorbic Acid (VITAMIN C) 1000 MG tablet Take 1,000 mg by mouth every morning.     aspirin EC 81 MG tablet Take 81 mg by mouth every morning.     atorvastatin (LIPITOR) 40 MG tablet Take 1 tablet (40 mg total) by mouth every evening. 90 tablet 0   Calcium Carb-Cholecalciferol (CALCIUM 600+D3 PO) Take 1 tablet by mouth every morning.     celecoxib (CELEBREX) 200 MG capsule Take 1 capsule (200 mg total) by mouth daily. 90 capsule 0   Cholecalciferol (VITAMIN D) 2000 units tablet Take 2,000 Units by mouth daily.     Coenzyme Q10 300 MG CAPS Take 1 capsule by mouth every morning.     diclofenac Sodium (VOLTAREN) 1 % GEL Apply topically 4 (four) times daily.     glucose blood test strip Use as instructed 100 each 12   levocetirizine (XYZAL) 5 MG tablet Take 1 tablet (5 mg total) by mouth every evening. 30 tablet 5   lisinopril (ZESTRIL) 5 MG tablet TAKE ONE TABLET BY MOUTH TWICE A DAY 90 tablet 0   LUTEIN-ZEAXANTHIN PO Take 1 tablet by mouth every morning.     Mometasone Furoate POWD      montelukast (SINGULAIR) 10 MG tablet Take 1 tablet (10 mg total) by mouth at bedtime. 90 tablet 0   Multiple Vitamins-Minerals (MULTIVITAMIN WITH MINERALS) tablet Take 1 tablet by mouth every morning.  Omega-3 Fatty Acids (EQL OMEGA 3 FISH OIL) 1400 MG CAPS Take 2 capsules by mouth daily.     omeprazole (PRILOSEC) 20 MG capsule Take 20 mg by mouth every morning.     OZEMPIC, 1 MG/DOSE, 2 MG/1.5ML SOPN  Inject 1 mg into the skin once a week. 4.5 mL 1   SYNJARDY XR 12.08-998 MG TB24 Take 1 tablet by mouth 2 (two) times daily. 30 tablet 5   Tobramycin Sulfate POWD      Triamcinolone Acetonide (NASACORT ALLERGY 24HR NA) Place into the nose.     Turmeric, Curcuma Longa, (TURMERIC ROOT) POWD Take by mouth. TURMERIC ROOT EXTRACT     UNABLE TO FIND Med Name: Cinnamon/Cinsilin     VITAMIN E PO Take by mouth.     No current facility-administered medications for this visit.   Allergies: Allergies  Allergen Reactions   Codeine Hives and Itching   Penicillins Hives and Itching   Benzonatate Rash   Epinephrine Palpitations   I reviewed her past medical history, social history, family history, and environmental history and no significant changes have been reported from her previous visit.  Review of Systems Objective: There were no vitals taken for this visit. There is no height or weight on file to calculate BMI. Physical Exam  Diagnostics: Spirometry:  Tracings reviewed. Her effort: {Blank single:19197::"Good reproducible efforts.","It was hard to get consistent efforts and there is a question as to whether this reflects a maximal maneuver.","Poor effort, data can not be interpreted."} FVC: ***L FEV1: ***L, ***% predicted FEV1/FVC ratio: ***% Interpretation: {Blank single:19197::"Spirometry consistent with mild obstructive disease","Spirometry consistent with moderate obstructive disease","Spirometry consistent with severe obstructive disease","Spirometry consistent with possible restrictive disease","Spirometry consistent with mixed obstructive and restrictive disease","Spirometry uninterpretable due to technique","Spirometry consistent with normal pattern","No overt abnormalities noted given today's efforts"}.  Please see scanned spirometry results for details.  Skin Testing: {Blank single:19197::"None","Deferred due to recent antihistamines use"}. Positive test to: ***. Negative test to:  ***.  Results discussed with patient/family.   Previous notes and tests were reviewed. The plan was reviewed with the patient/family, and all questions/concerned were addressed.  It was my pleasure to see Veronica Galvan today and participate in her care. Please feel free to contact me with any questions or concerns.  '[email or web address]'$ @

## 2021-11-08 ENCOUNTER — Encounter: Payer: Self-pay | Admitting: Internal Medicine

## 2021-11-08 ENCOUNTER — Encounter: Payer: Self-pay | Admitting: Psychiatry

## 2021-11-08 ENCOUNTER — Ambulatory Visit: Payer: Medicare Other | Admitting: Psychiatry

## 2021-11-08 ENCOUNTER — Ambulatory Visit: Payer: Medicare Other | Admitting: Internal Medicine

## 2021-11-08 VITALS — BP 124/67 | HR 67 | Temp 93.0°F | Ht 61.0 in | Wt 205.4 lb

## 2021-11-08 VITALS — BP 142/62 | HR 94 | Temp 97.9°F | Resp 19 | Wt 201.3 lb

## 2021-11-08 DIAGNOSIS — Z880 Allergy status to penicillin: Secondary | ICD-10-CM

## 2021-11-08 DIAGNOSIS — G4452 New daily persistent headache (NDPH): Secondary | ICD-10-CM

## 2021-11-08 DIAGNOSIS — M542 Cervicalgia: Secondary | ICD-10-CM | POA: Diagnosis not present

## 2021-11-08 NOTE — Progress Notes (Signed)
Referring:  Marrian Salvage, [address],  [place] [address]  PCP: Marrian Salvage, [facility] was asked to evaluate Veronica Galvan, a 66 year old female for a chief complaint of headaches.  Our recommendations of care will be communicated by shared medical record.    CC:  headaches  History provided from self  HPI:  Medical co-morbidities: migraines, chronic sinusitis, hx stomach ulcer, HLD, HTN, DM2  The patient presents for evaluation of headaches which began in November 2022. States she has struggled with sinus issues for a long time. Had multiple sinus infections over the past year and was treated with multiple sinus infections. Saw ENT who did a CT of the sinuses in March which was normal. Brain MRI was done in May 2023 and was unremarkable. Saw her dentist and was evaluated for TMJ which was unremarkable.  Headache is described as pressure in her bilateral face and ears. It is described as a constant nagging pain which is typically mild, but can get up to 8/10. She has a lot of neck tension as well and sees a massage therapist once per month.  Headache History: Onset: November 2022 Triggers: no Aura: none Location: bifrontal, ears Quality/Description: pressure, shooting pain Associated Symptoms:  Photophobia: no  Phonophobia: no  Nausea: no Worse with activity?: no Duration of headaches: constant  Headache days per month: 30 Headache free days per month: 0  Current Treatment: Abortive Tylenol celebrex  Preventative none  Prior Therapies                                 Lisinopril 5 mg daily Tylenol Celebrex   LABS: CBC    Component Value Date/Time   WBC 7.4 09/28/2021 0939   WBC 5.7 08/17/2021 0913   RBC 5.26 09/28/2021 0939   RBC 4.82 08/17/2021 0913   HGB 13.3 09/28/2021 0939   HCT 41.7 09/28/2021 0939   PLT 331 09/28/2021 0939   MCV 79 09/28/2021 0939   MCH 25.3 (L) 09/28/2021 0939   MCHC 31.9  09/28/2021 0939   MCHC 32.2 08/17/2021 0913   RDW 15.1 09/28/2021 0939   LYMPHSABS 2.2 09/28/2021 0939   MONOABS 0.4 08/17/2021 0913   EOSABS 0.3 09/28/2021 0939   BASOSABS 0.1 09/28/2021 0939      Latest Ref Rng & Units 08/17/2021    9:13 AM 04/16/2021    9:02 AM 01/12/2021   10:07 AM  CMP  Glucose 70 - 99 mg/dL 179  186  107   BUN 6 - 23 mg/dL '16  17  14   '$ Creatinine 0.40 - 1.20 mg/dL 0.52  0.57  0.52   Sodium 135 - 145 mEq/L 138  138  137   Potassium 3.5 - 5.1 mEq/L 4.3  4.3  4.3   Chloride 96 - 112 mEq/L 101  100  100   CO2 19 - 32 mEq/L '25  25  27   '$ Calcium 8.4 - 10.5 mg/dL 9.4  10.0  9.6   Total Protein 6.0 - 8.3 g/dL 7.0  7.3  6.7   Total Bilirubin 0.2 - 1.2 mg/dL 0.5  0.5  0.4   Alkaline Phos 39 - 117 U/L 62  70  60   AST 0 - 37 U/L 23  32  18   ALT 0 - 35 U/L 44  46  38      IMAGING:  MRI brain 08/31/21: unremarkable  Imaging independently reviewed on November 08, 2021   Current Outpatient Medications on File Prior to Visit  Medication Sig Dispense Refill   ALPRAZolam (XANAX) 0.5 MG tablet Take 1 tablet (0.5 mg total) by mouth at bedtime as needed for anxiety. 30 tablet 0   amoxicillin (AMOXIL) 400 MG/5ML suspension Do not take-bring to allergy and asthma visit on Monday 11/08/21 for challenge. 50 mL 0   Ascorbic Acid (VITAMIN C) 1000 MG tablet Take 1,000 mg by mouth every morning.     aspirin EC 81 MG tablet Take 81 mg by mouth every morning.     atorvastatin (LIPITOR) 40 MG tablet Take 1 tablet (40 mg total) by mouth every evening. 90 tablet 0   Calcium Carb-Cholecalciferol (CALCIUM 600+D3 PO) Take 1 tablet by mouth every morning.     celecoxib (CELEBREX) 200 MG capsule Take 1 capsule (200 mg total) by mouth daily. 90 capsule 0   Cholecalciferol (VITAMIN D) 2000 units tablet Take 2,000 Units by mouth daily.     Coenzyme Q10 300 MG CAPS Take 1 capsule by mouth every morning.     diclofenac Sodium (VOLTAREN) 1 % GEL Apply topically 4 (four) times daily.     glucose  blood test strip Use as instructed 100 each 12   levocetirizine (XYZAL) 5 MG tablet Take 1 tablet (5 mg total) by mouth every evening. 30 tablet 5   lisinopril (ZESTRIL) 5 MG tablet TAKE ONE TABLET BY MOUTH TWICE A DAY 90 tablet 0   LUTEIN-ZEAXANTHIN PO Take 1 tablet by mouth every morning.     Multiple Vitamins-Minerals (MULTIVITAMIN WITH MINERALS) tablet Take 1 tablet by mouth every morning.     Omega-3 Fatty Acids (EQL OMEGA 3 FISH OIL) 1400 MG CAPS Take 2 capsules by mouth daily.     omeprazole (PRILOSEC) 20 MG capsule Take 20 mg by mouth every morning.     OZEMPIC, 1 MG/DOSE, 2 MG/1.5ML SOPN Inject 1 mg into the skin once a week. 4.5 mL 1   SYNJARDY XR 12.08-998 MG TB24 Take 1 tablet by mouth 2 (two) times daily. 30 tablet 5   Tobramycin Sulfate POWD      Turmeric, Curcuma Longa, (TURMERIC ROOT) POWD Take by mouth. TURMERIC ROOT EXTRACT     UNABLE TO FIND Med Name: Cinnamon/Cinsilin     VITAMIN E PO Take by mouth.     No current facility-administered medications on file prior to visit.     Allergies: Allergies  Allergen Reactions   Codeine Hives and Itching   Benzonatate Rash   Epinephrine Palpitations    Family History: Aneurysms in a first degree relative:  no Brain tumors in the family:  no Other neurological illness in the family:   no  Past Medical History: Past Medical History:  Diagnosis Date   Allergy    Anxiety    Arthritis    hands   Cataract    Diabetes mellitus without complication ([place])    type 2   Family history of adverse reaction to anesthesia    son has malignant hyperthermia, daughter does not daughter recently had c section without problems   GERD (gastroesophageal reflux disease)    Headache    sinus   Hyperlipidemia    Hypertension    PONV (postoperative nausea and vomiting)    nausea only   Ulcer, stomach peptic yrs ago    Past Surgical History Past Surgical History:  Procedure Laterality Date   APPENDECTOMY  both hells bone spur  repair     both heels with metal clips   both shoulder rotator cuff repair     CESAREAN SECTION     x 1   COLONOSCOPY WITH PROPOFOL N/A 09/07/2016   Procedure: COLONOSCOPY WITH PROPOFOL;  Surgeon: Garlan Fair, MD;  Location: [facility];  Service: Endoscopy;  Laterality: N/A;   colonscopy  06/2011   polyps   EYE SURGERY     FRACTURE SURGERY     TUBAL LIGATION     VESICO-VAGINAL FISTULA REPAIR      Social History: Social History   Tobacco Use   Smoking status: Former    Packs/day: 1.00    Years: 14.00    Total pack years: 14.00    Types: Cigarettes   Smokeless tobacco: Never   Tobacco comments:    quit 31 yrs ago  Substance Use Topics   Alcohol use: Yes    Comment: rare   Drug use: No     ROS: Negative for fevers, chills. Positive for headaches. All other systems reviewed and negative unless stated otherwise in HPI.   Physical Exam:   Vital Signs: BP 124/67   Pulse 67   Temp (!) 93 F (33.9 C)   Ht '5\' 1"'$  (1.549 m)   Wt 205 lb 6 oz (93.2 kg)   BMI 38.81 kg/m  GENERAL: well appearing,in no acute distress,alert SKIN:  Color, texture, turgor normal. No rashes or lesions HEAD:  Normocephalic/atraumatic. CV:  RRR RESP: Normal respiratory effort MSK: +tenderness to palpation over bilateral occiput, neck, and shoulders  NEUROLOGICAL: Mental Status: Alert, oriented to person, place and time,Follows commands Cranial Nerves: PERRL, visual fields intact to confrontation, extraocular movements intact, facial sensation intact, no facial droop or ptosis, hearing grossly intact, no dysarthria Motor: muscle strength 5/5 both upper and lower extremities,no drift, normal tone Reflexes: 2+ throughout Sensation: intact to light touch all 4 extremities Coordination: Finger-to- nose-finger intact bilaterally Gait: normal-based   IMPRESSION: 66 year old female with a history of migraines, chronic sinusitis, hx stomach ulcer, HLD, HTN, DM2 who presents for evaluation  of daily headaches following a sinus infection in November 2022. Subsequent evaluations from ENT and dentistry have been unremarkable. MRI brain was unremarkable. 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 She would prefer to avoid medications at this time. Advised that she can take magnesium daily for headache prevention. Exam with significant neck tension and tenderness to palpation. Will refer to neck PT to help with her cervicalgia.  PLAN: -Referral to neck PT -Start magnesium 500 mg daily for headache prevention -next steps: consider gabapentin, SNRI for headache prevention   I spent a total of 30 minutes chart reviewing and counseling the patient. Headache education was done. Discussed treatment options including preventive medications, natural supplements, and physical therapy. Discussed medication side effects, adverse reactions and drug interactions. Written educational materials and patient instructions outlining all of the above were given.  Follow-up: 6 months   Genia Harold, MD 11/08/2021   3:05 PM

## 2021-11-08 NOTE — Progress Notes (Signed)
Follow Up Note  RE: Veronica Galvan MRN: [id number] DOB: [birth year] Date of Office Visit: 11/08/2021  Referring provider: Marrian Salvage,* Primary care provider: Marrian Salvage, [place]  Chief Complaint: Veronica Galvan Challenge (Pt is present for penicillin challenge)  Assessment and Plan: Veronica Galvan is a 66 y.o. female with: History of penicillin allergy:  Plan: Challenge drug: amoxicillin Challenge as per protocol: Passed Total time: 161 min  This medication has been removed from her allergy list.  Follow-up as needed  History of Present Illness: I had the pleasure of seeing Veronica Galvan for a follow up visit at the [facility] on 11/04/2021. She is a 66 y.o. female, who is being followed for recurrent infections, h/o penicillin allergy, h/o wheezing. Her previous allergy office visit was on 09/24/21 with Dr. Simona Huh. Today she is here for amoxicillin drug testing and challenge.    History of Reaction: - Penicillin- had a rash many years ago.  Has not tried this medication in years (greater than 30)  Interval History: Patient has not been ill, she has not had any accidental exposures to the culprit medication.   Recent/Current History: Pulmonary disease: no Cardiac disease: no Respiratory infection: no Rash: no Itch: no Swelling: no Cough: no Shortness of breath: no Runny/stuffy nose: no Itchy eyes: no  Patient/guardian was informed of the test procedure with verbalized understanding of the risk of anaphylaxis. Consent was signed.   Last antihistamine use: 5 days ago Last beta-blocker use: N/A  Medication List:  Current Outpatient Medications  Medication Sig Dispense Refill   ALPRAZolam (XANAX) 0.5 MG tablet Take 1 tablet (0.5 mg total) by mouth at bedtime as needed for anxiety. 30 tablet 0   amoxicillin (AMOXIL) 400 MG/5ML suspension Do not take-bring to allergy and asthma visit on Monday 11/08/21 for challenge. 50 mL 0   Ascorbic  Acid (VITAMIN C) 1000 MG tablet Take 1,000 mg by mouth every morning.     aspirin EC 81 MG tablet Take 81 mg by mouth every morning.     atorvastatin (LIPITOR) 40 MG tablet Take 1 tablet (40 mg total) by mouth every evening. 90 tablet 0   Calcium Carb-Cholecalciferol (CALCIUM 600+D3 PO) Take 1 tablet by mouth every morning.     celecoxib (CELEBREX) 200 MG capsule Take 1 capsule (200 mg total) by mouth daily. 90 capsule 0   Cholecalciferol (VITAMIN D) 2000 units tablet Take 2,000 Units by mouth daily.     Coenzyme Q10 300 MG CAPS Take 1 capsule by mouth every morning.     diclofenac Sodium (VOLTAREN) 1 % GEL Apply topically 4 (four) times daily.     glucose blood test strip Use as instructed 100 each 12   levocetirizine (XYZAL) 5 MG tablet Take 1 tablet (5 mg total) by mouth every evening. 30 tablet 5   lisinopril (ZESTRIL) 5 MG tablet TAKE ONE TABLET BY MOUTH TWICE A DAY 90 tablet 0   LUTEIN-ZEAXANTHIN PO Take 1 tablet by mouth every morning.     Multiple Vitamins-Minerals (MULTIVITAMIN WITH MINERALS) tablet Take 1 tablet by mouth every morning.     Omega-3 Fatty Acids (EQL OMEGA 3 FISH OIL) 1400 MG CAPS Take 2 capsules by mouth daily.     omeprazole (PRILOSEC) 20 MG capsule Take 20 mg by mouth every morning.     OZEMPIC, 1 MG/DOSE, 2 MG/1.5ML SOPN Inject 1 mg into the skin once a week. 4.5 mL 1   SYNJARDY XR 12.08-998 MG TB24 Take  1 tablet by mouth 2 (two) times daily. 30 tablet 5   Tobramycin Sulfate POWD      Turmeric, Curcuma Longa, (TURMERIC ROOT) POWD Take by mouth. TURMERIC ROOT EXTRACT     UNABLE TO FIND Med Name: Cinnamon/Cinsilin     VITAMIN E PO Take by mouth.     No current facility-administered medications for this visit.   Allergies: Allergies  Allergen Reactions   Codeine Hives and Itching   Benzonatate Rash   Epinephrine Palpitations   I reviewed her past medical history, social history, family history, and environmental history and no significant changes have been  reported from her previous visit.  Review of Systems-Negative except as per HPI  Objective: BP (!) 142/62   Pulse 94   Temp 97.9 F (36.6 C) (Temporal)   Resp 19   Wt 201 lb 4.5 oz (91.3 kg)   SpO2 98%   BMI 38.03 kg/m  Body mass index is 38.03 kg/m. Physical Exam  General Appearance:  Alert, cooperative, no distress, appears stated age  Head:  Normocephalic, without obvious abnormality, atraumatic  Eyes:  Conjunctiva clear, EOM's intact  Nose: Nares normal  Throat: Lips, tongue normal; teeth and gums normal, normal posterior oropharnyx  Neck: Supple, symmetrical  Lungs:   CTAB, Respirations unlabored, no coughing  Heart:  Appears well perfused  Extremities: No edema  Skin: Skin color, texture, turgor normal, no rashes or lesions on visualized portions of skin  Neurologic: No gross deficits     Diagnostics: Skin Testing:  PenG, pre-pen . Positive test to: histamine control. Negative test to: negative control and both pen g and pre-pre.  Results discussed with patient/family.  IDs: negative to prepen, pen G 50 u/ml and pen G 500 u/ml  Oral Challenge - 11/08/21 1000     Challenge Food/Drug pcn    Food/Drug provided by pt    Time 1020    Dose 15m    Additional Dose Yes    Time 1041    Dose 9ML             Penicillin - 11/08/21 0943     Location Arm    Time Testing Placed 0919    Control SPT Negative    Histamine SPT Negative    Pre-Pen Puncture Negative    Penicillin-G 5000 u/ml SPT Negative    Select Select    Time Testing Placed 0941    Control Intradermal Negative    Pre-Pen Intradermal Negative    Penicillin-G 50 u/ml Intradermal Negative    Time Testing Placed 0959    Penicillin-G 5000 u/ml Intradermal Negative             Previous notes and tests were reviewed. The plan was reviewed with the patient/family, and all questions/concerned were addressed.  It was my pleasure to see [place] and participate in her care. Please feel free  to contact me with any questions or concerns.  ESigurd Sos MD [facility]

## 2021-11-08 NOTE — Patient Instructions (Addendum)
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 This is a headache that begins one day and continues daily for at least 3 months. People with NDPH can often recall the exact day when the pain began. About 50% of those with NDPH will have associated migrainous features including throbbing pain, sensitivity to light, sensitivity to sound, or nausea. This can occur at any age, and is 2-3 times more common in women. It is sometimes preceded by a flu-like illness or a cold, a stressful life event, or a surgery. However, there is no clear inciting event in at least 50% of NDPH cases. Imaging of the brain should be done to rule out secondary causes of the headache, but imaging will be normal in most cases of NDPH.  Often people with NDPH will have a component of medication overuse headache from overuse of over the counter pain medications like ibuprofen or tylenol. We recommend limiting the use of over the counter medications to less than 2 days per week or 10 days per month. Using these medications more frequently can contribute to the headache and even make it worse.  We use daily preventative medications to treat NDPH. Lifestyle changes such as maintaining regular sleep, exercise, and a good diet can also help reduce headaches. Other non-medication options include behavioral therapy and biofeedback.

## 2021-11-12 ENCOUNTER — Other Ambulatory Visit: Payer: Self-pay | Admitting: Family

## 2021-11-16 ENCOUNTER — Other Ambulatory Visit: Payer: Self-pay | Admitting: Family

## 2021-11-16 ENCOUNTER — Ambulatory Visit (INDEPENDENT_AMBULATORY_CARE_PROVIDER_SITE_OTHER): Payer: Medicare Other | Admitting: Family

## 2021-11-16 VITALS — BP 138/74 | HR 97 | Temp 98.0°F | Resp 16 | Ht 61.0 in | Wt 205.2 lb

## 2021-11-16 DIAGNOSIS — I10 Essential (primary) hypertension: Secondary | ICD-10-CM

## 2021-11-16 DIAGNOSIS — K219 Gastro-esophageal reflux disease without esophagitis: Secondary | ICD-10-CM | POA: Diagnosis not present

## 2021-11-16 DIAGNOSIS — E785 Hyperlipidemia, unspecified: Secondary | ICD-10-CM

## 2021-11-16 DIAGNOSIS — E119 Type 2 diabetes mellitus without complications: Secondary | ICD-10-CM

## 2021-11-16 DIAGNOSIS — M1990 Unspecified osteoarthritis, unspecified site: Secondary | ICD-10-CM

## 2021-11-16 DIAGNOSIS — E1169 Type 2 diabetes mellitus with other specified complication: Secondary | ICD-10-CM

## 2021-11-16 LAB — COMPREHENSIVE METABOLIC PANEL
ALT: 30 U/L (ref 0–35)
AST: 20 U/L (ref 0–37)
Albumin: 4.3 g/dL (ref 3.5–5.2)
Alkaline Phosphatase: 65 U/L (ref 39–117)
BUN: 17 mg/dL (ref 6–23)
CO2: 22 mEq/L (ref 19–32)
Calcium: 9.6 mg/dL (ref 8.4–10.5)
Chloride: 101 mEq/L (ref 96–112)
Creatinine, Ser: 0.52 mg/dL (ref 0.40–1.20)
GFR: 96.46 mL/min (ref >60.00)
Glucose, Bld: 186 mg/dL — ABNORMAL HIGH (ref 70–99)
Potassium: 4.4 mEq/L (ref 3.5–5.1)
Sodium: 136 mEq/L (ref 135–145)
Total Bilirubin: 0.4 mg/dL (ref 0.2–1.2)
Total Protein: 6.6 g/dL (ref 6.0–8.3)

## 2021-11-16 LAB — HEMOGLOBIN A1C: Hgb A1c MFr Bld: 8.4 % — ABNORMAL HIGH (ref 4.6–6.5)

## 2021-11-16 MED ORDER — ATORVASTATIN CALCIUM 40 MG PO TABS
40.0000 mg | ORAL_TABLET | Freq: Every evening | ORAL | 3 refills | Status: DC
Start: 2021-11-16 — End: 2023-02-06

## 2021-11-16 MED ORDER — SEMAGLUTIDE (2 MG/DOSE) 8 MG/3ML ~~LOC~~ SOPN: 2.0000 mg | PEN_INJECTOR | SUBCUTANEOUS | 3 refills | Status: DC

## 2021-11-16 MED ORDER — CELECOXIB 200 MG PO CAPS: 200.0000 mg | ORAL_CAPSULE | Freq: Every day | ORAL | 3 refills | Status: DC

## 2021-11-16 MED ORDER — SYNJARDY XR 12.5-1000 MG PO TB24: 1.0000 | ORAL_TABLET | Freq: Two times a day (BID) | ORAL | 3 refills | Status: DC

## 2021-11-16 MED ORDER — OMEPRAZOLE 20 MG PO CPDR
20.0000 mg | DELAYED_RELEASE_CAPSULE | ORAL | 3 refills | Status: AC
Start: 2021-11-16 — End: ?

## 2021-11-16 MED ORDER — LISINOPRIL 5 MG PO TABS: 5.0000 mg | ORAL_TABLET | Freq: Two times a day (BID) | ORAL | 3 refills | Status: DC

## 2021-11-16 NOTE — Progress Notes (Signed)
Veronica Galvan is a 66 y.o. 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   Rosacea 08/18/2021   Actinic keratosis 08/18/2021   Sensorineural hearing loss (SNHL) of left ear with unrestricted hearing of right ear 07/26/2021   Tinnitus of left ear 07/26/2021   Acute recurrent maxillary sinusitis 06/22/2021   Primary hypertension 01/12/2021   Hyperlipidemia associated with type 2 diabetes mellitus (Veronica Galvan) 01/12/2021   Arthritis 01/12/2021   Type II diabetes mellitus (Veronica Galvan) 11/25/2019   Ingrown toenail 09/05/2018   Neck pain 01/29/2018   Tick bite 10/24/2017    Current Outpatient Medications  Medication Sig Dispense Refill   ALPRAZolam (XANAX) 0.5 MG tablet Take 1 tablet (0.5 mg total) by mouth at bedtime as needed for anxiety. 30 tablet 0   amoxicillin (AMOXIL) 400 MG/5ML suspension Do not take-bring to allergy and asthma visit on Monday 11/08/21 for challenge. 50 mL 0   Ascorbic Acid (VITAMIN C) 1000 MG tablet Take 1,000 mg by mouth every morning.     aspirin EC 81 MG tablet Take 81 mg by mouth every morning.     Calcium Carb-Cholecalciferol (CALCIUM 600+D3 PO) Take 1 tablet by mouth every morning.     Cholecalciferol (VITAMIN D) 2000 units tablet Take 2,000 Units by mouth daily.     Coenzyme Q10 300 MG CAPS Take 1 capsule by mouth every morning.     diclofenac Sodium (VOLTAREN) 1 % GEL Apply topically 4 (four) times daily.     glucose blood test strip Use as instructed 100 each 12   levocetirizine (XYZAL) 5 MG tablet Take 1 tablet (5 mg total) by mouth every evening. 30 tablet 5   LUTEIN-ZEAXANTHIN PO Take 1  tablet by mouth every morning.     Multiple Vitamins-Minerals (MULTIVITAMIN WITH MINERALS) tablet Take 1 tablet by mouth every morning.     Omega-3 Fatty Acids (EQL OMEGA 3 FISH OIL) 1400 MG CAPS Take 2 capsules by mouth daily.     Semaglutide, 2 MG/DOSE, 8 MG/3ML SOPN Inject 2 mg as directed once a week. 9 mL 3   Tobramycin Sulfate POWD      Turmeric, Curcuma Longa, (TURMERIC ROOT) POWD Take by mouth. TURMERIC ROOT EXTRACT     UNABLE TO FIND Med Name: Cinnamon/Cinsilin     VITAMIN E PO Take by mouth.     atorvastatin (LIPITOR) 40 MG tablet Take 1 tablet (40 mg total) by mouth every evening. 90 tablet 3   celecoxib (CELEBREX) 200 MG capsule Take 1 capsule (200 mg total) by mouth daily. 90 capsule 3   lisinopril (ZESTRIL) 5 MG tablet Take 1 tablet (5 mg total) by mouth 2 (two) times daily. 180 tablet 3   omeprazole (PRILOSEC) 20 MG capsule Take 1 capsule (20 mg total) by mouth every morning. 90 capsule 3   SYNJARDY XR 12.08-998 MG TB24 Take 1 tablet by mouth 2 (two) times daily. 180 tablet 3   No current facility-administered medications for this visit.    Allergies: Codeine, Benzonatate, and Epinephrine  Past Medical History:  Diagnosis Date   Allergy    Anxiety    Arthritis    hands   Cataract  Diabetes mellitus without complication (Veronica Galvan)    type 2   Family history of adverse reaction to anesthesia    son has malignant hyperthermia, daughter does not daughter recently had c section without problems   GERD (gastroesophageal reflux disease)    Headache    sinus   Hyperlipidemia    Hypertension    PONV (postoperative nausea and vomiting)    nausea only   Ulcer, stomach peptic yrs ago    Past Surgical History:  Procedure Laterality Date   APPENDECTOMY     both hells bone spur repair     both heels with metal clips   both shoulder rotator cuff repair     CESAREAN SECTION     x 1   COLONOSCOPY WITH PROPOFOL N/A 09/07/2016   Procedure: COLONOSCOPY WITH PROPOFOL;  Surgeon:  Garlan Fair, MD;  Location: [facility];  Service: Endoscopy;  Laterality: N/A;   colonscopy  06/2011   polyps   EYE SURGERY     FRACTURE SURGERY     TUBAL LIGATION     VESICO-VAGINAL FISTULA REPAIR      Family History  Problem Relation Age of Onset   Hypertension Mother    COPD Mother    Cancer Mother    Arthritis Mother    Hypertension Father    Hyperlipidemia Father    Diabetes Father    Cancer Father    Alcohol abuse Father    Diabetes Brother    Alcohol abuse Brother    Hypertension Brother    Arthritis Maternal Grandmother    Birth defects Maternal Grandmother    Diabetes Paternal Grandmother    Heart disease Paternal Grandfather    Diabetes Paternal Aunt    Diabetes Paternal Aunt    Obesity Son     Social History   Tobacco Use   Smoking status: Former    Packs/day: 1.00    Years: 14.00    Total pack years: 14.00    Types: Cigarettes   Smokeless tobacco: Never   Tobacco comments:    quit 31 yrs ago  Substance Use Topics   Alcohol use: Yes    Comment: rare    Subjective:   3 month follow up on Type 2 Diabetes; has been checking fasting blood sugar- elevated between 140-160; admits is not eating like she needs to be; Denies any chest pain, shortness of breath, blurred vision.  Scheduled to see ENT next month for chronic sinus issues/ headache; neurology work up was unremarkable;      Objective:  Vitals:   11/16/21 0842  BP: 138/74  Pulse: 97  Resp: 16  Temp: 98 F (36.7 C)  TempSrc: Oral  SpO2: 99%  Weight: 205 lb 3.2 oz (93.1 kg)  Height: 5' 1"  (1.549 m)    General: Well developed, well nourished, in no acute distress  Skin : Warm and dry.  Head: Normocephalic and atraumatic  Lungs: Respirations unlabored; clear to auscultation bilaterally without wheeze, rales, rhonchi  CVS exam: normal rate and regular rhythm.  Neurologic: Alert and oriented; speech intact; face symmetrical; moves all extremities well; CNII-XII intact without focal  deficit   Assessment:  1. Type 2 diabetes mellitus without complication, without long-term current use of insulin (Veronica Galvan)   2. Hyperlipidemia associated with type 2 diabetes mellitus (Veronica Galvan)   3. Primary hypertension   4. Arthritis   5. Gastroesophageal reflux disease, unspecified whether esophagitis present     Plan:  Update labs; suspect uncontrolled- increase Ozempic to  2 mg weekly; follow up in 3 months; Refill updated; Stable; refill updated; Concern for worsening symptoms; Celebrex RX is updated and she is planning to see her rheumatologist at [facility]; Stable; refill updated;   Return in about 3 months (around 02/16/2022).  Orders Placed This Encounter  Procedures   Comp Met (CMET)   Hemoglobin A1c    Requested Prescriptions   Signed Prescriptions Disp Refills   atorvastatin (LIPITOR) 40 MG tablet 90 tablet 3    Sig: Take 1 tablet (40 mg total) by mouth every evening.   celecoxib (CELEBREX) 200 MG capsule 90 capsule 3    Sig: Take 1 capsule (200 mg total) by mouth daily.   omeprazole (PRILOSEC) 20 MG capsule 90 capsule 3    Sig: Take 1 capsule (20 mg total) by mouth every morning.   lisinopril (ZESTRIL) 5 MG tablet 180 tablet 3    Sig: Take 1 tablet (5 mg total) by mouth 2 (two) times daily.   Semaglutide, 2 MG/DOSE, 8 MG/3ML SOPN 9 mL 3    Sig: Inject 2 mg as directed once a week.   SYNJARDY XR 12.08-998 MG TB24 180 tablet 3    Sig: Take 1 tablet by mouth 2 (two) times daily.

## 2021-11-17 DIAGNOSIS — Z6838 Body mass index (BMI) 38.0-38.9, adult: Secondary | ICD-10-CM | POA: Diagnosis not present

## 2021-11-17 DIAGNOSIS — M154 Erosive (osteo)arthritis: Secondary | ICD-10-CM | POA: Diagnosis not present

## 2021-11-17 DIAGNOSIS — E669 Obesity, unspecified: Secondary | ICD-10-CM | POA: Diagnosis not present

## 2021-11-25 DIAGNOSIS — Z888 Allergy status to other drugs, medicaments and biological substances status: Secondary | ICD-10-CM | POA: Diagnosis not present

## 2021-11-25 DIAGNOSIS — Z8709 Personal history of other diseases of the respiratory system: Secondary | ICD-10-CM | POA: Diagnosis not present

## 2021-11-30 NOTE — Therapy (Signed)
OUTPATIENT PHYSICAL THERAPY CERVICAL EVALUATION   Patient Name: Veronica Galvan MRN: [id number] DOB:[birth year], 66 y.o., female Today's Date: 11/30/2021    Past Medical History:  Diagnosis Date   Allergy    Anxiety    Arthritis    hands   Cataract    Diabetes mellitus without complication ([place])    type 2   Family history of adverse reaction to anesthesia    son has malignant hyperthermia, daughter does not daughter recently had c section without problems   GERD (gastroesophageal reflux disease)    Headache    sinus   Hyperlipidemia    Hypertension    PONV (postoperative nausea and vomiting)    nausea only   Ulcer, stomach peptic yrs ago   Past Surgical History:  Procedure Laterality Date   APPENDECTOMY     both hells bone spur repair     both heels with metal clips   both shoulder rotator cuff repair     CESAREAN SECTION     x 1   COLONOSCOPY WITH PROPOFOL N/A 09/07/2016   Procedure: COLONOSCOPY WITH PROPOFOL;  Surgeon: Garlan Fair, MD;  Location: [facility];  Service: Endoscopy;  Laterality: N/A;   colonscopy  06/2011   polyps   EYE SURGERY     FRACTURE SURGERY     TUBAL LIGATION     VESICO-VAGINAL FISTULA REPAIR     Patient Active Problem List   Diagnosis Date Noted   Gastroesophageal reflux disease 11/16/2021   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 08/18/2021   Dermatofibroma 08/18/2021   History of malignant neoplasm of skin 08/18/2021   Lentigo 08/18/2021   Melanocytic nevi of trunk 08/18/2021   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   Rosacea 08/18/2021   Actinic keratosis 08/18/2021   Sensorineural hearing loss (SNHL) of left ear with unrestricted hearing of right ear 07/26/2021   Tinnitus of left ear 07/26/2021   Acute recurrent maxillary sinusitis 06/22/2021   Primary hypertension 01/12/2021   Hyperlipidemia associated with type 2 diabetes mellitus ([place]) 01/12/2021   Arthritis 01/12/2021   Type II  diabetes mellitus ([place]) 11/25/2019   Ingrown toenail 09/05/2018   Neck pain 01/29/2018   Tick bite 10/24/2017    PCP: Jodi Mourning  REFERRING PROVIDER: Genia Harold  REFERRING DIAG: M54.2  THERAPY DIAG:  No diagnosis found.  Rationale for Evaluation and Treatment Rehabilitation  ONSET DATE: 11/08/21  SUBJECTIVE:  SUBJECTIVE STATEMENT[facility] Have a headache since November, had all sorts of images and seen lots of doctors. Have not had an X-ray of my neck but I think it I might need to. I think it's my sinuses and not a tension headache but it is constant.   PERTINENT HISTORY:  Arthritis, HTN, bilateral rotator cuff surgery 15-20yr ago, DM  PAIN:  Are you having pain? Yes: NPRS scale: 8/10 Pain location: head and neck Pain description: dull, constant Aggravating factors: moving Relieving factors: massage, standing in the shower  WEIGHT BEARING RESTRICTIONS No  FALLS:  Has patient fallen in last 6 months? No  LIVING ENVIRONMENT: Lives with: lives alone Lives in: House/apartment Stairs: Yes: External: 3 steps; can reach both Has following equipment at home: None  OCCUPATION: reitred  PLOF: Independent  PATIENT GOALS to loosen it up, find out what is causing my headaches   OBJECTIVE:   DIAGNOSTIC FINDINGS:   FINDINGS: Brain: No acute infarct, mass effect or extra-axial collection. No acute or chronic hemorrhage. Normal white matter signal, parenchymal volume and CSF spaces. The midline structures are normal.  Skull and upper cervical spine: Normal calvarium and skull base. Visualized upper cervical spine and soft tissues are normal.   Sinuses/Orbits:No paranasal sinus fluid levels or advanced mucosal thickening. No mastoid  or middle ear effusion. Normal orbits.   IMPRESSION: Normal brain MRI.  PATIENT SURVEYS:  FOTO 53/100   COGNITION: Overall cognitive status: Within functional limits for tasks assessed  SENSATION: WFL  POSTURE: rounded shoulders, forward head, and increased thoracic kyphosis  PALPATION: TTP and sore along upper trap and cervical paraspinals, trigger points in R and L UT   CERVICAL ROM:   Active ROM A/PROM (deg) eval  Flexion 100% no pain  Extension Mild limitations w/pain  Right lateral flexion Mod limitation w/pain  Left lateral flexion Mod limitation w/pain  Right rotation 75% w/pain  Left rotation 75% w/pain    (Blank rows = not tested)  UPPER EXTREMITY ROM: WFL, limited in IR and ER due to prior RC surgery years ago   UPPER EXTREMITY MMT:  MMT Right eval Left eval  Shoulder flexion 4+ 4+  Shoulder extension    Shoulder abduction 4+ very painful 4+  Shoulder adduction    Shoulder extension    Shoulder internal rotation 4+ 4+  Shoulder external rotation    Middle trapezius    Lower trapezius    Elbow flexion 5 5  Elbow extension 5 5  Wrist flexion    Wrist extension    Wrist ulnar deviation    Wrist radial deviation    Wrist pronation    Wrist supination    Grip strength     (Blank rows = not tested)   TODAY'S TREATMENT:  Eval and POC   PATIENT EDUCATION:  Education details: POC Person educated: Patient Education method: Explanation Education comprehension: verbalized understanding   HOME EXERCISE PROGRAM: Access Code: [email or web address] URL: [email or web address] Date: 12/01/2021 Prepared by: MAndris Baumann Exercises - Seated Upper Trapezius Stretch  - 1 x daily - 7 x weekly - 2 sets - 30 hold - Seated Levator Scapulae Stretch  - 1 x daily - 7 x weekly - 2 sets - 30 hold  ASSESSMENT:  CLINICAL IMPRESSION: Patient is a 66y.o. female who was seen today for physical therapy evaluation and treatment for neck pain and headaches. She  has been dealing with this for over six months and  has seen many different doctors to find the root of it. She wants to try physical therapy to see if we can help reduce tightness and pain.  Patient sometimes deals with BPPV, has been treated for it but it comes back at times and she states it seems to be seasonal. She presents with noticeable trigger points, tenderness, and pain along bilateral upper traps that limits her range of motion. Patient will benefit from skilled PT intervention to address stiffness and pain in neck to improve QOL.   REHAB POTENTIAL: Good  CLINICAL DECISION MAKING: Stable/uncomplicated  EVALUATION COMPLEXITY: Low   GOALS: Goals reviewed with patient? Yes  SHORT TERM GOALS: Target date: 12/29/21  Patient will be independent with initial HEP.  Goal status: INITIAL  LONG TERM GOALS: Target date: 01/26/22  Patient will be independent with advanced/ongoing HEP to improve outcomes and carryover.  Goal status: INITIAL  2.  Patient will report 75% improvement in neck pain to improve QOL.  Goal status: INITIAL  3.  Patient will demonstrate full pain free cervical ROM to complete ADLs and play with grandchildren with more ease. Goal status: INITIAL  4.  Patient will demonstrate improved posture to decrease muscle imbalance and tightness.  Baseline: increased tightness along cervical paraspinals Goal status: INITIAL  5.  Patient will report 14 on FOTO to demonstrate improved functional ability.  Baseline: 53 Goal status: INITIAL   PLAN: PT FREQUENCY: 2x/week  PT DURATION: 8 weeks  PLANNED INTERVENTIONS: Therapeutic exercises, Therapeutic activity, Neuromuscular re-education, Balance training, Gait training, Patient/Family education, Self Care, Joint mobilization, Dry Needling, Spinal manipulation, Spinal mobilization, Cryotherapy, Moist heat, Taping, Vasopneumatic device, Traction, Ionotophoresis '4mg'$ /ml Dexamethasone, and Manual therapy  PLAN FOR NEXT  SESSION: Moist heat, DN, stretching, light strengthening    Andris Baumann, PT 11/30/2021, 2:06 PM

## 2021-12-01 ENCOUNTER — Ambulatory Visit: Payer: Medicare Other | Attending: Psychiatry

## 2021-12-01 DIAGNOSIS — M542 Cervicalgia: Secondary | ICD-10-CM | POA: Diagnosis not present

## 2021-12-01 DIAGNOSIS — G8929 Other chronic pain: Secondary | ICD-10-CM | POA: Diagnosis not present

## 2021-12-01 DIAGNOSIS — M6281 Muscle weakness (generalized): Secondary | ICD-10-CM | POA: Insufficient documentation

## 2021-12-01 DIAGNOSIS — R519 Headache, unspecified: Secondary | ICD-10-CM | POA: Diagnosis not present

## 2021-12-07 NOTE — Therapy (Signed)
OUTPATIENT PHYSICAL THERAPY CERVICAL TREATMENT   Patient Name: Veronica Galvan MRN: [id number] DOB:1956-01-14, 66 y.o., female Today's Date: 12/08/2021   PT End of Session - 12/08/21 0804     Visit Number 2    Date for PT Re-Evaluation 01/26/22    Progress Note Due on Visit 10    PT Start Time 0802    PT Stop Time 0847    PT Time Calculation (min) 45 min    Activity Tolerance Patient tolerated treatment well    Behavior During Therapy [facility] for tasks assessed/performed             Past Medical History:  Diagnosis Date   Allergy    Anxiety    Arthritis    hands   Cataract    Diabetes mellitus without complication ([place])    type 2   Family history of adverse reaction to anesthesia    son has malignant hyperthermia, daughter does not daughter recently had c section without problems   GERD (gastroesophageal reflux disease)    Headache    sinus   Hyperlipidemia    Hypertension    PONV (postoperative nausea and vomiting)    nausea only   Ulcer, stomach peptic yrs ago   Past Surgical History:  Procedure Laterality Date   APPENDECTOMY     both hells bone spur repair     both heels with metal clips   both shoulder rotator cuff repair     CESAREAN SECTION     x 1   COLONOSCOPY WITH PROPOFOL N/A 09/07/2016   Procedure: COLONOSCOPY WITH PROPOFOL;  Surgeon: Garlan Fair, MD;  Location: [facility];  Service: Endoscopy;  Laterality: N/A;   colonscopy  06/2011   polyps   EYE SURGERY     FRACTURE SURGERY     TUBAL LIGATION     VESICO-VAGINAL FISTULA REPAIR     Patient Active Problem List   Diagnosis Date Noted   Gastroesophageal reflux disease 11/16/2021   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 08/18/2021   Dermatofibroma 08/18/2021   History of malignant neoplasm of skin 08/18/2021   Lentigo 08/18/2021   Melanocytic nevi of trunk 08/18/2021   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   Rosacea 08/18/2021   Actinic keratosis  08/18/2021   Sensorineural hearing loss (SNHL) of left ear with unrestricted hearing of right ear 07/26/2021   Tinnitus of left ear 07/26/2021   Acute recurrent maxillary sinusitis 06/22/2021   Primary hypertension 01/12/2021   Hyperlipidemia associated with type 2 diabetes mellitus ([place]) 01/12/2021   Arthritis 01/12/2021   Type II diabetes mellitus ([place]) 11/25/2019   Ingrown toenail 09/05/2018   Neck pain 01/29/2018   Tick bite 10/24/2017    PCP: Jodi Mourning  REFERRING PROVIDER: Genia Harold  REFERRING DIAG: M54.2  THERAPY DIAG:  Cervicalgia  Muscle weakness (generalized)  Chronic intractable headache, unspecified headache type  Rationale for Evaluation and Treatment Rehabilitation  ONSET DATE: 11/08/21  SUBJECTIVE:  SUBJECTIVE STATEMENT: Having the usual headache this morning, my hands hurt more than my neck   PERTINENT HISTORY:  Arthritis, HTN, bilateral rotator cuff surgery 15-34yr ago, DM  PAIN:  Are you having pain? Yes: NPRS scale: 7/10 Pain location: head and neck Pain description: dull, constant Aggravating factors: moving Relieving factors: massage, standing in the shower  WEIGHT BEARING RESTRICTIONS No  FALLS:  Has patient fallen in last 6 months? No  LIVING ENVIRONMENT: Lives with: lives alone Lives in: House/apartment Stairs: Yes: External: 3 steps; can reach both Has following equipment at home: None  OCCUPATION: reitred  PLOF: Independent  PATIENT GOALS to loosen it up, find out what is causing my headaches   OBJECTIVE:   DIAGNOSTIC FINDINGS:   FINDINGS: Brain: No acute infarct, mass effect or extra-axial collection. No acute or chronic hemorrhage. Normal white matter signal, parenchymal volume and CSF spaces. The midline structures are  normal.  Skull and upper cervical spine: Normal calvarium and skull base. Visualized upper cervical spine and soft tissues are normal.   Sinuses/Orbits:No paranasal sinus fluid levels or advanced mucosal thickening. No mastoid or middle ear effusion. Normal orbits.   IMPRESSION: Normal brain MRI.  PATIENT SURVEYS:  FOTO 53/100   COGNITION: Overall cognitive status: Within functional limits for tasks assessed  SENSATION: WFL  POSTURE: rounded shoulders, forward head, and increased thoracic kyphosis  PALPATION: TTP and sore along upper trap and cervical paraspinals, trigger points in R and L UT   CERVICAL ROM:   Active ROM A/PROM (deg) eval  Flexion 100% no pain  Extension Mild limitations w/pain  Right lateral flexion Mod limitation w/pain  Left lateral flexion Mod limitation w/pain  Right rotation 75% w/pain  Left rotation 75% w/pain    (Blank rows = not tested)  UPPER EXTREMITY ROM: WFL, limited in IR and ER due to prior RC surgery years ago   UPPER EXTREMITY MMT:  MMT Right eval Left eval  Shoulder flexion 4+ 4+  Shoulder extension    Shoulder abduction 4+ very painful 4+  Shoulder adduction    Shoulder extension    Shoulder internal rotation 4+ 4+  Shoulder external rotation    Middle trapezius    Lower trapezius    Elbow flexion 5 5  Elbow extension 5 5  Wrist flexion    Wrist extension    Wrist ulnar deviation    Wrist radial deviation    Wrist pronation    Wrist supination    Grip strength     (Blank rows = not tested)   TODAY'S TREATMENT:    12/08/21   UBE L1 x621ms    Stretching UT, scalenes, cervical rotation and side bending    STM to bilat UT, scalenes, rhomboids   RedTB rows/ext 2x10   Shoulder flexion 1# WaTE 2x10   MH to upper back and upper traps 1071m   PATIENT EDUCATION:  Education details: POC Person educated: Patient Education method: Explanation Education comprehension: verbalized understanding   HOME EXERCISE  PROGRAM: Access Code: [email or web address] URL: [email or web address] Date: 12/01/2021 Prepared by: MonAndris Baumannxercises - Seated Upper Trapezius Stretch  - 1 x daily - 7 x weekly - 2 sets - 30 hold - Seated Levator Scapulae Stretch  - 1 x daily - 7 x weekly - 2 sets - 30 hold  ASSESSMENT:  CLINICAL IMPRESSION: Patient returns to PT following evaluation with no real changes in symptoms. She is doing her stretches every day. She saw her massage therapist after the eval  and she also found the same trigger points and helped loosen her up a little bit. We focused on stretching and light strength activities today. She continues to have lots of tightness in cervical spine and upper traps. Tolerates exercises well, ended with MH.   REHAB POTENTIAL: Good  CLINICAL DECISION MAKING: Stable/uncomplicated  EVALUATION COMPLEXITY: Low   GOALS: Goals reviewed with patient? Yes  SHORT TERM GOALS: Target date: 12/29/21  Patient will be independent with initial HEP.  Goal status: INITIAL  LONG TERM GOALS: Target date: 01/26/22  Patient will be independent with advanced/ongoing HEP to improve outcomes and carryover.  Goal status: INITIAL  2.  Patient will report 75% improvement in neck pain to improve QOL.  Goal status: INITIAL  3.  Patient will demonstrate full pain free cervical ROM to complete ADLs and play with grandchildren with more ease. Goal status: INITIAL  4.  Patient will demonstrate improved posture to decrease muscle imbalance and tightness.  Baseline: increased tightness along cervical paraspinals Goal status: INITIAL  5.  Patient will report 90 on FOTO to demonstrate improved functional ability.  Baseline: 53 Goal status: INITIAL   PLAN: PT FREQUENCY: 2x/week  PT DURATION: 8 weeks  PLANNED INTERVENTIONS: Therapeutic exercises, Therapeutic activity, Neuromuscular re-education, Balance training, Gait training, Patient/Family education, Self Care, Joint mobilization,  Dry Needling, Spinal manipulation, Spinal mobilization, Cryotherapy, Moist heat, Taping, Vasopneumatic device, Traction, Ionotophoresis '4mg'$ /ml Dexamethasone, and Manual therapy  PLAN FOR NEXT SESSION: Moist heat, DN if Ronalee Belts is available, stretching, light strengthening    Andris Baumann, PT 12/08/2021, 8:50 AM

## 2021-12-08 ENCOUNTER — Ambulatory Visit: Payer: Medicare Other

## 2021-12-08 DIAGNOSIS — H524 Presbyopia: Secondary | ICD-10-CM | POA: Diagnosis not present

## 2021-12-08 DIAGNOSIS — R519 Headache, unspecified: Secondary | ICD-10-CM

## 2021-12-08 DIAGNOSIS — M542 Cervicalgia: Secondary | ICD-10-CM

## 2021-12-08 DIAGNOSIS — H35033 Hypertensive retinopathy, bilateral: Secondary | ICD-10-CM | POA: Diagnosis not present

## 2021-12-08 DIAGNOSIS — Z7984 Long term (current) use of oral hypoglycemic drugs: Secondary | ICD-10-CM | POA: Diagnosis not present

## 2021-12-08 DIAGNOSIS — E119 Type 2 diabetes mellitus without complications: Secondary | ICD-10-CM | POA: Diagnosis not present

## 2021-12-08 DIAGNOSIS — M6281 Muscle weakness (generalized): Secondary | ICD-10-CM | POA: Diagnosis not present

## 2021-12-08 DIAGNOSIS — G8929 Other chronic pain: Secondary | ICD-10-CM | POA: Diagnosis not present

## 2021-12-08 LAB — HM DIABETES EYE EXAM

## 2021-12-09 DIAGNOSIS — H524 Presbyopia: Secondary | ICD-10-CM | POA: Diagnosis not present

## 2021-12-10 ENCOUNTER — Ambulatory Visit: Payer: Medicare Other | Admitting: Physical Therapy

## 2021-12-10 DIAGNOSIS — M6281 Muscle weakness (generalized): Secondary | ICD-10-CM

## 2021-12-10 DIAGNOSIS — R519 Headache, unspecified: Secondary | ICD-10-CM | POA: Diagnosis not present

## 2021-12-10 DIAGNOSIS — G8929 Other chronic pain: Secondary | ICD-10-CM | POA: Diagnosis not present

## 2021-12-10 DIAGNOSIS — M542 Cervicalgia: Secondary | ICD-10-CM

## 2021-12-10 NOTE — Therapy (Signed)
OUTPATIENT PHYSICAL THERAPY CERVICAL TREATMENT   Patient Name: Veronica Galvan MRN: [id number] DOB:1955-08-31, 66 y.o., female Today's Date: 12/10/2021   PT End of Session - 12/10/21 0805     Visit Number 3    Date for PT Re-Evaluation 01/26/22    PT Start Time 0805    PT Stop Time 0845    PT Time Calculation (min) 40 min             Past Medical History:  Diagnosis Date   Allergy    Anxiety    Arthritis    hands   Cataract    Diabetes mellitus without complication ([place])    type 2   Family history of adverse reaction to anesthesia    son has malignant hyperthermia, daughter does not daughter recently had c section without problems   GERD (gastroesophageal reflux disease)    Headache    sinus   Hyperlipidemia    Hypertension    PONV (postoperative nausea and vomiting)    nausea only   Ulcer, stomach peptic yrs ago   Past Surgical History:  Procedure Laterality Date   APPENDECTOMY     both hells bone spur repair     both heels with metal clips   both shoulder rotator cuff repair     CESAREAN SECTION     x 1   COLONOSCOPY WITH PROPOFOL N/A 09/07/2016   Procedure: COLONOSCOPY WITH PROPOFOL;  Surgeon: Garlan Fair, MD;  Location: [facility];  Service: Endoscopy;  Laterality: N/A;   colonscopy  06/2011   polyps   EYE SURGERY     FRACTURE SURGERY     TUBAL LIGATION     VESICO-VAGINAL FISTULA REPAIR     Patient Active Problem List   Diagnosis Date Noted   Gastroesophageal reflux disease 11/16/2021   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 08/18/2021   Dermatofibroma 08/18/2021   History of malignant neoplasm of skin 08/18/2021   Lentigo 08/18/2021   Melanocytic nevi of trunk 08/18/2021   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   Rosacea 08/18/2021   Actinic keratosis 08/18/2021   Sensorineural hearing loss (SNHL) of left ear with unrestricted hearing of right ear 07/26/2021   Tinnitus of left ear 07/26/2021   Acute  recurrent maxillary sinusitis 06/22/2021   Primary hypertension 01/12/2021   Hyperlipidemia associated with type 2 diabetes mellitus ([place]) 01/12/2021   Arthritis 01/12/2021   Type II diabetes mellitus ([place]) 11/25/2019   Ingrown toenail 09/05/2018   Neck pain 01/29/2018   Tick bite 10/24/2017    PCP: Jodi Mourning  REFERRING PROVIDER: Genia Harold  REFERRING DIAG: M54.2  THERAPY DIAG:  Cervicalgia  Muscle weakness (generalized)  Chronic intractable headache, unspecified headache type  Rationale for Evaluation and Treatment Rehabilitation  ONSET DATE: 11/08/21  SUBJECTIVE:  SUBJECTIVE STATEMENT: Same- no changes with anything yet. Really want to try DN  PERTINENT HISTORY:  Arthritis, HTN, bilateral rotator cuff surgery 15-55yr ago, DM  PAIN:  Are you having pain? Yes: NPRS scale: 7/10 Pain location: head and neck Pain description: dull, constant Aggravating factors: moving Relieving factors: massage, standing in the shower  WEIGHT BEARING RESTRICTIONS No  FALLS:  Has patient fallen in last 6 months? No  LIVING ENVIRONMENT: Lives with: lives alone Lives in: House/apartment Stairs: Yes: External: 3 steps; can reach both Has following equipment at home: None  OCCUPATION: reitred  PLOF: Independent  PATIENT GOALS to loosen it up, find out what is causing my headaches   OBJECTIVE:   DIAGNOSTIC FINDINGS:   FINDINGS: Brain: No acute infarct, mass effect or extra-axial collection. No acute or chronic hemorrhage. Normal white matter signal, parenchymal volume and CSF spaces. The midline structures are normal.  Skull and upper cervical spine: Normal calvarium and skull base. Visualized upper cervical spine and soft tissues are normal.   Sinuses/Orbits:No  paranasal sinus fluid levels or advanced mucosal thickening. No mastoid or middle ear effusion. Normal orbits.   IMPRESSION: Normal brain MRI.  PATIENT SURVEYS:  FOTO 53/100   COGNITION: Overall cognitive status: Within functional limits for tasks assessed  SENSATION: WFL  POSTURE: rounded shoulders, forward head, and increased thoracic kyphosis  PALPATION: TTP and sore along upper trap and cervical paraspinals, trigger points in R and L UT   CERVICAL ROM:   Active ROM A/PROM (deg) eval  Flexion 100% no pain  Extension Mild limitations w/pain  Right lateral flexion Mod limitation w/pain  Left lateral flexion Mod limitation w/pain  Right rotation 75% w/pain  Left rotation 75% w/pain    (Blank rows = not tested)  UPPER EXTREMITY ROM: WFL, limited in IR and ER due to prior RC surgery years ago   UPPER EXTREMITY MMT:  MMT Right eval Left eval  Shoulder flexion 4+ 4+  Shoulder extension    Shoulder abduction 4+ very painful 4+  Shoulder adduction    Shoulder extension    Shoulder internal rotation 4+ 4+  Shoulder external rotation    Middle trapezius    Lower trapezius    Elbow flexion 5 5  Elbow extension 5 5  Wrist flexion    Wrist extension    Wrist ulnar deviation    Wrist radial deviation    Wrist pronation    Wrist supination    Grip strength     (Blank rows = not tested)   TODAY'S TREATMENT:   12/10/21 UBE 2 min fwd/2 min back L 2 Cerv retratcion head on ball 10 x 3 sec hold, then hold with 2# UE ex for stab Yellow tband 3 pt stab on way 10 each 2# shruggs,shld ext with scap squeeze and backward rolls 10 each standing Red tband row and shld ext 2 sets 10 Supine prom and end range stretching, suboccipital STW Cerv mech traction with MH 12 # 10 min     12/08/21   UBE L1 x62ms    Stretching UT, scalenes, cervical rotation and side bending    STM to bilat UT, scalenes, rhomboids   RedTB rows/ext 2x10   Shoulder flexion 1# WaTE 2x10   MH to  upper back and upper traps 109m   PATIENT EDUCATION:  Education details: POC Person educated: Patient Education method: Explanation Education comprehension: verbalized understanding   HOME EXERCISE PROGRAM: Access Code: [email or web address] URL: [email or web address] Date: 12/01/2021 Prepared by: MonAndris Baumann  Exercises - Seated Upper Trapezius Stretch  - 1 x daily - 7 x weekly - 2 sets - 30 hold - Seated Levator Scapulae Stretch  - 1 x daily - 7 x weekly - 2 sets - 30 hold  ASSESSMENT:  CLINICAL IMPRESSION: Patient returns to PT with no real changes in symptoms. She is doing her stretches every day and water ex 3 x a week. Pt needed postural cuing with ex as she tends to hold head down, tactile cuing to activate cerv muscles with head on ball ex. Trial of mech traction to see if prolonged stretching helps.  REHAB POTENTIAL: Good  CLINICAL DECISION MAKING: Stable/uncomplicated  EVALUATION COMPLEXITY: Low   GOALS: Goals reviewed with patient? Yes  SHORT TERM GOALS: Target date: 12/29/21  Patient will be independent with initial HEP.  Goal status: INITIAL  LONG TERM GOALS: Target date: 01/26/22  Patient will be independent with advanced/ongoing HEP to improve outcomes and carryover.  Goal status: INITIAL  2.  Patient will report 75% improvement in neck pain to improve QOL.  Goal status: INITIAL  3.  Patient will demonstrate full pain free cervical ROM to complete ADLs and play with grandchildren with more ease. Goal status: INITIAL  4.  Patient will demonstrate improved posture to decrease muscle imbalance and tightness.  Baseline: increased tightness along cervical paraspinals Goal status: INITIAL  5.  Patient will report 37 on FOTO to demonstrate improved functional ability.  Baseline: 53 Goal status: INITIAL   PLAN: PT FREQUENCY: 2x/week  PT DURATION: 8 weeks  PLANNED INTERVENTIONS: Therapeutic exercises, Therapeutic activity, Neuromuscular  re-education, Balance training, Gait training, Patient/Family education, Self Care, Joint mobilization, Dry Needling, Spinal manipulation, Spinal mobilization, Cryotherapy, Moist heat, Taping, Vasopneumatic device, Traction, Ionotophoresis '4mg'$ /ml Dexamethasone, and Manual therapy  PLAN FOR NEXT SESSION: DN cerv as well as consider TMJ   Keller Mikels,ANGIE, PTA 12/10/2021, 8:06 AM     [address] [place], [place], [address] Phone: [phone]   Fax:  [phone]  Patient Details  Name: MELENA HAYES MRN: [id number] Date of Birth: [birth year] Referring Provider:  Marrian Salvage,*  Encounter Date: 12/10/2021   Laqueta Carina, PTA 12/10/2021, 8:06 AM  [address] [place], [place], [address] Phone: [phone]   Fax:  [phone]

## 2021-12-13 NOTE — Therapy (Signed)
OUTPATIENT PHYSICAL THERAPY CERVICAL TREATMENT   Patient Name: Veronica Galvan MRN: [id number] DOB:10-27-1955, 66 y.o., female Today's Date: 12/15/2021   PT End of Session - 12/14/21 1500     Visit Number 4    Date for PT Re-Evaluation 01/26/22    PT Start Time 1500    PT Stop Time 1545    PT Time Calculation (min) 45 min    Activity Tolerance Patient tolerated treatment well    Behavior During Therapy WFL for tasks assessed/performed              Past Medical History:  Diagnosis Date   Allergy    Anxiety    Arthritis    hands   Cataract    Diabetes mellitus without complication ([place])    type 2   Family history of adverse reaction to anesthesia    son has malignant hyperthermia, daughter does not daughter recently had c section without problems   GERD (gastroesophageal reflux disease)    Headache    sinus   Hyperlipidemia    Hypertension    PONV (postoperative nausea and vomiting)    nausea only   Ulcer, stomach peptic yrs ago   Past Surgical History:  Procedure Laterality Date   APPENDECTOMY     both hells bone spur repair     both heels with metal clips   both shoulder rotator cuff repair     CESAREAN SECTION     x 1   COLONOSCOPY WITH PROPOFOL N/A 09/07/2016   Procedure: COLONOSCOPY WITH PROPOFOL;  Surgeon: Garlan Fair, MD;  Location: [facility];  Service: Endoscopy;  Laterality: N/A;   colonscopy  06/2011   polyps   EYE SURGERY     FRACTURE SURGERY     TUBAL LIGATION     VESICO-VAGINAL FISTULA REPAIR     Patient Active Problem List   Diagnosis Date Noted   Gastroesophageal reflux disease 11/16/2021   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 08/18/2021   Dermatofibroma 08/18/2021   History of malignant neoplasm of skin 08/18/2021   Lentigo 08/18/2021   Melanocytic nevi of trunk 08/18/2021   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   Rosacea 08/18/2021   Actinic keratosis 08/18/2021   Sensorineural hearing  loss (SNHL) of left ear with unrestricted hearing of right ear 07/26/2021   Tinnitus of left ear 07/26/2021   Acute recurrent maxillary sinusitis 06/22/2021   Primary hypertension 01/12/2021   Hyperlipidemia associated with type 2 diabetes mellitus ([place]) 01/12/2021   Arthritis 01/12/2021   Type II diabetes mellitus ([place]) 11/25/2019   Ingrown toenail 09/05/2018   Neck pain 01/29/2018   Tick bite 10/24/2017    PCP: Jodi Mourning  REFERRING PROVIDER: Genia Harold  REFERRING DIAG: M54.2  THERAPY DIAG:  Cervicalgia  Muscle weakness (generalized)  Chronic intractable headache, unspecified headache type  Rationale for Evaluation and Treatment Rehabilitation  ONSET DATE: 11/08/21  SUBJECTIVE:  SUBJECTIVE STATEMENT: Everything is the same, excited for dry needling. Traction was good, felt like it loosened things up   PERTINENT HISTORY:  Arthritis, HTN, bilateral rotator cuff surgery 15-75yr ago, DM  PAIN:  Are you having pain? Yes: NPRS scale: 7/10 Pain location: head and neck Pain description: dull, constant Aggravating factors: moving Relieving factors: massage, standing in the shower  WEIGHT BEARING RESTRICTIONS No  FALLS:  Has patient fallen in last 6 months? No  LIVING ENVIRONMENT: Lives with: lives alone Lives in: House/apartment Stairs: Yes: External: 3 steps; can reach both Has following equipment at home: None  OCCUPATION: reitred  PLOF: Independent  PATIENT GOALS to loosen it up, find out what is causing my headaches   OBJECTIVE:   DIAGNOSTIC FINDINGS:   FINDINGS: Brain: No acute infarct, mass effect or extra-axial collection. No acute or chronic hemorrhage. Normal white matter signal, parenchymal volume and CSF spaces. The midline structures are  normal.  Skull and upper cervical spine: Normal calvarium and skull base. Visualized upper cervical spine and soft tissues are normal.   Sinuses/Orbits:No paranasal sinus fluid levels or advanced mucosal thickening. No mastoid or middle ear effusion. Normal orbits.   IMPRESSION: Normal brain MRI.  PATIENT SURVEYS:  FOTO 53/100   COGNITION: Overall cognitive status: Within functional limits for tasks assessed  SENSATION: WFL  POSTURE: rounded shoulders, forward head, and increased thoracic kyphosis  PALPATION: TTP and sore along upper trap and cervical paraspinals, trigger points in R and L UT   CERVICAL ROM:   Active ROM A/PROM (deg) eval  Flexion 100% no pain  Extension Mild limitations w/pain  Right lateral flexion Mod limitation w/pain  Left lateral flexion Mod limitation w/pain  Right rotation 75% w/pain  Left rotation 75% w/pain    (Blank rows = not tested)  UPPER EXTREMITY ROM: WFL, limited in IR and ER due to prior RC surgery years ago   UPPER EXTREMITY MMT:  MMT Right eval Left eval  Shoulder flexion 4+ 4+  Shoulder extension    Shoulder abduction 4+ very painful 4+  Shoulder adduction    Shoulder extension    Shoulder internal rotation 4+ 4+  Shoulder external rotation    Middle trapezius    Lower trapezius    Elbow flexion 5 5  Elbow extension 5 5  Wrist flexion    Wrist extension    Wrist ulnar deviation    Wrist radial deviation    Wrist pronation    Wrist supination    Grip strength     (Blank rows = not tested)   TODAY'S TREATMENT:  12/14/21 UBE L2x 611ms Dry needling bilat UT, lateral pterygoid, and massester  MH to UT and cold pack to occiput 1015m   12/10/21 UBE 2 min fwd/2 min back L 2 Cerv retratcion head on ball 10 x 3 sec hold, then hold with 2# UE ex for stab Yellow tband 3 pt stab on way 10 each 2# shruggs,shld ext with scap squeeze and backward rolls 10 each standing Red tband row and shld ext 2 sets 10 Supine prom  and end range stretching, suboccipital STW Cerv mech traction with MH 12 # 10 min     12/08/21   UBE L1 x6mi78m   Stretching UT, scalenes, cervical rotation and side bending    STM to bilat UT, scalenes, rhomboids   RedTB rows/ext 2x10   Shoulder flexion 1# WaTE 2x10   MH to upper back and upper traps 10mi19m PATIENT EDUCATION:  Education details: POC Person educated: Patient Education method: Explanation Education comprehension: verbalized understanding   HOME EXERCISE PROGRAM: Access Code: [email or web address] URL: [email or web address] Date: 12/01/2021 Prepared by: Andris Baumann  Exercises - Seated Upper Trapezius Stretch  - 1 x daily - 7 x weekly - 2 sets - 30 hold - Seated Levator Scapulae Stretch  - 1 x daily - 7 x weekly - 2 sets - 30 hold  ASSESSMENT:  CLINICAL IMPRESSION: Dry needling done by certified PT, followed by moist heat and cold pack to help with headache and prevent possible soreness. Patient had good jump reaction to dry needling to TMJ and upper traps.   REHAB POTENTIAL: Good  CLINICAL DECISION MAKING: Stable/uncomplicated  EVALUATION COMPLEXITY: Low   GOALS: Goals reviewed with patient? Yes  SHORT TERM GOALS: Target date: 12/29/21  Patient will be independent with initial HEP.  Goal status: INITIAL  LONG TERM GOALS: Target date: 01/26/22  Patient will be independent with advanced/ongoing HEP to improve outcomes and carryover.  Goal status: INITIAL  2.  Patient will report 75% improvement in neck pain to improve QOL.  Goal status: INITIAL  3.  Patient will demonstrate full pain free cervical ROM to complete ADLs and play with grandchildren with more ease. Goal status: INITIAL  4.  Patient will demonstrate improved posture to decrease muscle imbalance and tightness.  Baseline: increased tightness along cervical paraspinals Goal status: INITIAL  5.  Patient will report 44 on FOTO to demonstrate improved functional ability.  Baseline:  53 Goal status: INITIAL   PLAN: PT FREQUENCY: 2x/week  PT DURATION: 8 weeks  PLANNED INTERVENTIONS: Therapeutic exercises, Therapeutic activity, Neuromuscular re-education, Balance training, Gait training, Patient/Family education, Self Care, Joint mobilization, Dry Needling, Spinal manipulation, Spinal mobilization, Cryotherapy, Moist heat, Taping, Vasopneumatic device, Traction, Ionotophoresis '4mg'$ /ml Dexamethasone, and Manual therapy  PLAN FOR NEXT SESSION: assess DN, try again to other areas as necessary, stretching and strengthening of upper back and cervical spine   Andris Baumann, PT 12/15/2021, 8:55 AM     [address]. [place], [place], [address] Phone: [phone]   Fax:  [phone]  Patient Details  Name: KHAMIA STAMBAUGH MRN: [id number] Date of Birth: 07-20-1955 Referring Provider:  Marrian Salvage,*  Encounter Date: 12/14/2021   Andris Baumann, PT 12/15/2021, 8:55 AM  [facility]. [place], [place], [address] Phone: [phone]   Fax:  [phone]

## 2021-12-14 ENCOUNTER — Ambulatory Visit: Payer: Medicare Other

## 2021-12-14 DIAGNOSIS — R519 Headache, unspecified: Secondary | ICD-10-CM

## 2021-12-14 DIAGNOSIS — M542 Cervicalgia: Secondary | ICD-10-CM

## 2021-12-14 DIAGNOSIS — M6281 Muscle weakness (generalized): Secondary | ICD-10-CM

## 2021-12-16 NOTE — Therapy (Signed)
OUTPATIENT PHYSICAL THERAPY CERVICAL TREATMENT   Patient Name: Veronica Galvan MRN: [id number] DOB:01-26-1956, 66 y.o., female Today's Date: 12/17/2021   PT End of Session - 12/17/21 0928     Visit Number 5    Date for PT Re-Evaluation 01/26/22    PT Start Time 0930    PT Stop Time 1017    PT Time Calculation (min) 47 min    Activity Tolerance Patient tolerated treatment well    Behavior During Therapy [facility] for tasks assessed/performed               Past Medical History:  Diagnosis Date   Allergy    Anxiety    Arthritis    hands   Cataract    Diabetes mellitus without complication ([place])    type 2   Family history of adverse reaction to anesthesia    son has malignant hyperthermia, daughter does not daughter recently had c section without problems   GERD (gastroesophageal reflux disease)    Headache    sinus   Hyperlipidemia    Hypertension    PONV (postoperative nausea and vomiting)    nausea only   Ulcer, stomach peptic yrs ago   Past Surgical History:  Procedure Laterality Date   APPENDECTOMY     both hells bone spur repair     both heels with metal clips   both shoulder rotator cuff repair     CESAREAN SECTION     x 1   COLONOSCOPY WITH PROPOFOL N/A 09/07/2016   Procedure: COLONOSCOPY WITH PROPOFOL;  Surgeon: Garlan Fair, MD;  Location: [facility];  Service: Endoscopy;  Laterality: N/A;   colonscopy  06/2011   polyps   EYE SURGERY     FRACTURE SURGERY     TUBAL LIGATION     VESICO-VAGINAL FISTULA REPAIR     Patient Active Problem List   Diagnosis Date Noted   Gastroesophageal reflux disease 11/16/2021   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 08/18/2021   Dermatofibroma 08/18/2021   History of malignant neoplasm of skin 08/18/2021   Lentigo 08/18/2021   Melanocytic nevi of trunk 08/18/2021   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   Rosacea 08/18/2021   Actinic keratosis 08/18/2021   Sensorineural  hearing loss (SNHL) of left ear with unrestricted hearing of right ear 07/26/2021   Tinnitus of left ear 07/26/2021   Acute recurrent maxillary sinusitis 06/22/2021   Primary hypertension 01/12/2021   Hyperlipidemia associated with type 2 diabetes mellitus ([place]) 01/12/2021   Arthritis 01/12/2021   Type II diabetes mellitus ([place]) 11/25/2019   Ingrown toenail 09/05/2018   Neck pain 01/29/2018   Tick bite 10/24/2017    PCP: Jodi Mourning  REFERRING PROVIDER: Genia Harold  REFERRING DIAG: M54.2  THERAPY DIAG:  Cervicalgia  Muscle weakness (generalized)  Chronic intractable headache, unspecified headache type  Rationale for Evaluation and Treatment Rehabilitation  ONSET DATE: 11/08/21  SUBJECTIVE:  SUBJECTIVE STATEMENT: Still having a headache, dry needling was good but has not worked because I am still very tight and my jaw is sore. Saw massage therapist after DN and she could feel where it was loosened up but there are still knots. Arrived today after aquatic therapy.   PERTINENT HISTORY:  Arthritis, HTN, bilateral rotator cuff surgery 15-85yr ago, DM  PAIN:  Are you having pain? Yes: NPRS scale: 7/10 Pain location: head and neck Pain description: dull, constant Aggravating factors: moving Relieving factors: massage, standing in the shower  WEIGHT BEARING RESTRICTIONS No  FALLS:  Has patient fallen in last 6 months? No  LIVING ENVIRONMENT: Lives with: lives alone Lives in: House/apartment Stairs: Yes: External: 3 steps; can reach both Has following equipment at home: None  OCCUPATION: reitred  PLOF: Independent  PATIENT GOALS to loosen it up, find out what is causing my headaches   OBJECTIVE:   DIAGNOSTIC FINDINGS:   FINDINGS: Brain: No acute infarct, mass  effect or extra-axial collection. No acute or chronic hemorrhage. Normal white matter signal, parenchymal volume and CSF spaces. The midline structures are normal.  Skull and upper cervical spine: Normal calvarium and skull base. Visualized upper cervical spine and soft tissues are normal.   Sinuses/Orbits:No paranasal sinus fluid levels or advanced mucosal thickening. No mastoid or middle ear effusion. Normal orbits.   IMPRESSION: Normal brain MRI.  PATIENT SURVEYS:  FOTO 53/100   COGNITION: Overall cognitive status: Within functional limits for tasks assessed  SENSATION: WFL  POSTURE: rounded shoulders, forward head, and increased thoracic kyphosis  PALPATION: TTP and sore along upper trap and cervical paraspinals, trigger points in R and L UT   CERVICAL ROM:   Active ROM A/PROM (deg) eval  Flexion 100% no pain  Extension Mild limitations w/pain  Right lateral flexion Mod limitation w/pain  Left lateral flexion Mod limitation w/pain  Right rotation 75% w/pain  Left rotation 75% w/pain    (Blank rows = not tested)  UPPER EXTREMITY ROM: WFL, limited in IR and ER due to prior RC surgery years ago   UPPER EXTREMITY MMT:  MMT Right eval Left eval  Shoulder flexion 4+ 4+  Shoulder extension    Shoulder abduction 4+ very painful 4+  Shoulder adduction    Shoulder extension    Shoulder internal rotation 4+ 4+  Shoulder external rotation    Middle trapezius    Lower trapezius    Elbow flexion 5 5  Elbow extension 5 5  Wrist flexion    Wrist extension    Wrist ulnar deviation    Wrist radial deviation    Wrist pronation    Wrist supination    Grip strength     (Blank rows = not tested)   TODAY'S TREATMENT:  12/16/21 UBE L2 x624ms  Seated rows 15# 2x10 Lat pull downs 25# 2x10 Shoulder ext 5# 2x10 Cable rows 5# x10, 10# x10 2# IYT 2x10 Pec stretch 30s  Cerv mech traction with MH 12# 10 min  12/14/21 UBE L2x 12m40m Dry needling bilat UT, lateral  pterygoid, and massester  MH to UT and cold pack to occiput 45m34m  12/10/21 UBE 2 min fwd/2 min back L 2 Cerv retratcion head on ball 10 x 3 sec hold, then hold with 2# UE ex for stab Yellow tband 3 pt stab on way 10 each 2# shruggs,shld ext with scap squeeze and backward rolls 10 each standing Red tband row and shld ext 2 sets 10 Supine prom and  end range stretching, suboccipital STW Cerv mech traction with MH 12 # 10 min     12/08/21   UBE L1 x81mns    Stretching UT, scalenes, cervical rotation and side bending    STM to bilat UT, scalenes, rhomboids   RedTB rows/ext 2x10   Shoulder flexion 1# WaTE 2x10   MH to upper back and upper traps 168ms   PATIENT EDUCATION:  Education details: POC Person educated: Patient Education method: Explanation Education comprehension: verbalized understanding   HOME EXERCISE PROGRAM: Access Code: [email or web address] URL: [email or web address] Date: 12/01/2021 Prepared by: MoAndris BaumannExercises - Seated Upper Trapezius Stretch  - 1 x daily - 7 x weekly - 2 sets - 30 hold - Seated Levator Scapulae Stretch  - 1 x daily - 7 x weekly - 2 sets - 30 hold  ASSESSMENT:  CLINICAL IMPRESSION: Patient returns after dry needling session. She did not have a lot of relief from it but wants to try it again because she still has a lot of tightness and trigger points in her upper traps. Does well with strengthening exercises today. Patient requested cervical traction, stating that it helped her last time.    REHAB POTENTIAL: Good  CLINICAL DECISION MAKING: Stable/uncomplicated  EVALUATION COMPLEXITY: Low   GOALS: Goals reviewed with patient? Yes  SHORT TERM GOALS: Target date: 12/29/21  Patient will be independent with initial HEP.  Goal status: INITIAL  LONG TERM GOALS: Target date: 01/26/22  Patient will be independent with advanced/ongoing HEP to improve outcomes and carryover.  Goal status: INITIAL  2.  Patient will report 75%  improvement in neck pain to improve QOL.  Goal status: INITIAL  3.  Patient will demonstrate full pain free cervical ROM to complete ADLs and play with grandchildren with more ease. Goal status: INITIAL  4.  Patient will demonstrate improved posture to decrease muscle imbalance and tightness.  Baseline: increased tightness along cervical paraspinals Goal status: INITIAL  5.  Patient will report 6455n FOTO to demonstrate improved functional ability.  Baseline: 53 Goal status: INITIAL   PLAN: PT FREQUENCY: 2x/week  PT DURATION: 8 weeks  PLANNED INTERVENTIONS: Therapeutic exercises, Therapeutic activity, Neuromuscular re-education, Balance training, Gait training, Patient/Family education, Self Care, Joint mobilization, Dry Needling, Spinal manipulation, Spinal mobilization, Cryotherapy, Moist heat, Taping, Vasopneumatic device, Traction, Ionotophoresis '4mg'$ /ml Dexamethasone, and Manual therapy  PLAN FOR NEXT SESSION: assess DN, try again to other areas as necessary, stretching and strengthening of upper back and cervical spine   MoAndris BaumannPT 12/17/2021, 10:21 AM     [place]one: [phone] Fax:  [phone] Details  Name: KaOSHA RANERN: [id number] of Birth: [birth year]eferring Provider:  MuMarrian Salvage  Encounter Date: 12/17/2021   MoAndris BaumannPT 12/17/2021, 10:21 AM  [id number]: [phone] Fax:  [phone]

## 2021-12-17 ENCOUNTER — Ambulatory Visit: Payer: Medicare Other

## 2021-12-17 DIAGNOSIS — M6281 Muscle weakness (generalized): Secondary | ICD-10-CM | POA: Diagnosis not present

## 2021-12-17 DIAGNOSIS — M542 Cervicalgia: Secondary | ICD-10-CM

## 2021-12-17 DIAGNOSIS — G8929 Other chronic pain: Secondary | ICD-10-CM | POA: Diagnosis not present

## 2021-12-17 DIAGNOSIS — R519 Headache, unspecified: Secondary | ICD-10-CM | POA: Diagnosis not present

## 2021-12-21 ENCOUNTER — Ambulatory Visit: Payer: Medicare Other

## 2021-12-21 DIAGNOSIS — M6281 Muscle weakness (generalized): Secondary | ICD-10-CM

## 2021-12-21 DIAGNOSIS — G8929 Other chronic pain: Secondary | ICD-10-CM | POA: Diagnosis not present

## 2021-12-21 DIAGNOSIS — M542 Cervicalgia: Secondary | ICD-10-CM

## 2021-12-21 DIAGNOSIS — R519 Headache, unspecified: Secondary | ICD-10-CM

## 2021-12-21 NOTE — Therapy (Signed)
OUTPATIENT PHYSICAL THERAPY CERVICAL TREATMENT   Patient Name: Veronica Galvan MRN: [id number] DOB:[birth year], 66 y.o., female Today's Date: 12/21/2021   PT End of Session - 12/21/21 1503     Visit Number 6    Date for PT Re-Evaluation 01/26/22    PT Start Time 1502    PT Stop Time 1545    PT Time Calculation (min) 43 min    Activity Tolerance Patient tolerated treatment well    Behavior During Therapy WFL for tasks assessed/performed                Past Medical History:  Diagnosis Date   Allergy    Anxiety    Arthritis    hands   Cataract    Diabetes mellitus without complication ([place])    type 2   Family history of adverse reaction to anesthesia    son has malignant hyperthermia, daughter does not daughter recently had c section without problems   GERD (gastroesophageal reflux disease)    Headache    sinus   Hyperlipidemia    Hypertension    PONV (postoperative nausea and vomiting)    nausea only   Ulcer, stomach peptic yrs ago   Past Surgical History:  Procedure Laterality Date   APPENDECTOMY     both hells bone spur repair     both heels with metal clips   both shoulder rotator cuff repair     CESAREAN SECTION     x 1   COLONOSCOPY WITH PROPOFOL N/A 09/07/2016   Procedure: COLONOSCOPY WITH PROPOFOL;  Surgeon: Garlan Fair, MD;  Location: [facility];  Service: Endoscopy;  Laterality: N/A;   colonscopy  06/2011   polyps   EYE SURGERY     FRACTURE SURGERY     TUBAL LIGATION     VESICO-VAGINAL FISTULA REPAIR     Patient Active Problem List   Diagnosis Date Noted   Gastroesophageal reflux disease 11/16/2021   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 08/18/2021   Dermatofibroma 08/18/2021   History of malignant neoplasm of skin 08/18/2021   Lentigo 08/18/2021   Melanocytic nevi of trunk 08/18/2021   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   Rosacea 08/18/2021   Actinic keratosis 08/18/2021   Sensorineural  hearing loss (SNHL) of left ear with unrestricted hearing of right ear 07/26/2021   Tinnitus of left ear 07/26/2021   Acute recurrent maxillary sinusitis 06/22/2021   Primary hypertension 01/12/2021   Hyperlipidemia associated with type 2 diabetes mellitus ([place]) 01/12/2021   Arthritis 01/12/2021   Type II diabetes mellitus ([place]) 11/25/2019   Ingrown toenail 09/05/2018   Neck pain 01/29/2018   Tick bite 10/24/2017    PCP: Jodi Mourning  REFERRING PROVIDER: Genia Harold  REFERRING DIAG: M54.2  THERAPY DIAG:  Cervicalgia  Muscle weakness (generalized)  Chronic intractable headache, unspecified headache type  Rationale for Evaluation and Treatment Rehabilitation  ONSET DATE: 11/08/21  SUBJECTIVE:  SUBJECTIVE STATEMENT: No real changes in the neck and shoulder, I think the jaw on the R side is not as tight.   PERTINENT HISTORY:  Arthritis, HTN, bilateral rotator cuff surgery 15-33yr ago, DM  PAIN:  Are you having pain? Yes: NPRS scale: 7/10 Pain location: head and neck Pain description: dull, constant Aggravating factors: moving Relieving factors: massage, standing in the shower  WEIGHT BEARING RESTRICTIONS No  FALLS:  Has patient fallen in last 6 months? No  LIVING ENVIRONMENT: Lives with: lives alone Lives in: House/apartment Stairs: Yes: External: 3 steps; can reach both Has following equipment at home: None  OCCUPATION: reitred  PLOF: Independent  PATIENT GOALS to loosen it up, find out what is causing my headaches   OBJECTIVE:   DIAGNOSTIC FINDINGS:   FINDINGS: Brain: No acute infarct, mass effect or extra-axial collection. No acute or chronic hemorrhage. Normal white matter signal, parenchymal volume and CSF spaces. The midline structures are  normal.  Skull and upper cervical spine: Normal calvarium and skull base. Visualized upper cervical spine and soft tissues are normal.   Sinuses/Orbits:No paranasal sinus fluid levels or advanced mucosal thickening. No mastoid or middle ear effusion. Normal orbits.   IMPRESSION: Normal brain MRI.  PATIENT SURVEYS:  FOTO 53/100   COGNITION: Overall cognitive status: Within functional limits for tasks assessed  SENSATION: WFL  POSTURE: rounded shoulders, forward head, and increased thoracic kyphosis  PALPATION: TTP and sore along upper trap and cervical paraspinals, trigger points in R and L UT   CERVICAL ROM:   Active ROM A/PROM (deg) eval  Flexion 100% no pain  Extension Mild limitations w/pain  Right lateral flexion Mod limitation w/pain  Left lateral flexion Mod limitation w/pain  Right rotation 75% w/pain  Left rotation 75% w/pain    (Blank rows = not tested)  UPPER EXTREMITY ROM: WFL, limited in IR and ER due to prior RC surgery years ago   UPPER EXTREMITY MMT:  MMT Right eval Left eval  Shoulder flexion 4+ 4+  Shoulder extension    Shoulder abduction 4+ very painful 4+  Shoulder adduction    Shoulder extension    Shoulder internal rotation 4+ 4+  Shoulder external rotation    Middle trapezius    Lower trapezius    Elbow flexion 5 5  Elbow extension 5 5  Wrist flexion    Wrist extension    Wrist ulnar deviation    Wrist radial deviation    Wrist pronation    Wrist supination    Grip strength     (Blank rows = not tested)   TODAY'S TREATMENT:  12/21/21 UBE L2 x664ms  Seated rows 15# 2x10 Lat pull downs 25# 2x10 DN to rhomboids, UT, and TMJ  Shoulder ext 3# WaTE 2x10 3# shruggs 2x10 3# backward rolls 2x10 Shoulder ext 5# 2x10    12/16/21 UBE L2 x6m60m  Seated rows 15# 2x10 Lat pull downs 25# 2x10 Shoulder ext 5# 2x10 Cable rows 5# x10, 10# x10 2# IYT 2x10 Pec stretch 30s  Cerv mech traction with MH 12# 10 min  12/14/21 UBE L2x  6mi73mDry needling bilat UT, lateral pterygoid, and massester  MH to UT and cold pack to occiput 10mi90m 12/10/21 UBE 2 min fwd/2 min back L 2 Cerv retratcion head on ball 10 x 3 sec hold, then hold with 2# UE ex for stab Yellow tband 3 pt stab on way 10 each 2# shruggs,shld ext with scap squeeze and backward rolls 10 each  standing Red tband row and shld ext 2 sets 10 Supine prom and end range stretching, suboccipital STW Cerv mech traction with MH 12 # 10 min     12/08/21   UBE L1 x20mns    Stretching UT, scalenes, cervical rotation and side bending    STM to bilat UT, scalenes, rhomboids   RedTB rows/ext 2x10   Shoulder flexion 1# WaTE 2x10   MH to upper back and upper traps 142ms   PATIENT EDUCATION:  Education details: POC Person educated: Patient Education method: Explanation Education comprehension: verbalized understanding   HOME EXERCISE PROGRAM: Access Code: [email or web address] URL: [email or web address] Date: 12/01/2021 Prepared by: MoAndris BaumannExercises - Seated Upper Trapezius Stretch  - 1 x daily - 7 x weekly - 2 sets - 30 hold - Seated Levator Scapulae Stretch  - 1 x daily - 7 x weekly - 2 sets - 30 hold  ASSESSMENT:  CLINICAL IMPRESSION: We did dry needling again today to upper traps and TMJ again as well as rhomboids. Patient responds well to it and has some jump reactions when trigger points are hit. Continued with UE and cervical muscle strengthening. Assess DN next visit and continue with stretches.    REHAB POTENTIAL: Good  CLINICAL DECISION MAKING: Stable/uncomplicated  EVALUATION COMPLEXITY: Low   GOALS: Goals reviewed with patient? Yes  SHORT TERM GOALS: Target date: 12/29/21  Patient will be independent with initial HEP.  Goal status: INITIAL  LONG TERM GOALS: Target date: 01/26/22  Patient will be independent with advanced/ongoing HEP to improve outcomes and carryover.  Goal status: INITIAL  2.  Patient will report 75%  improvement in neck pain to improve QOL.  Goal status: INITIAL  3.  Patient will demonstrate full pain free cervical ROM to complete ADLs and play with grandchildren with more ease. Goal status: INITIAL  4.  Patient will demonstrate improved posture to decrease muscle imbalance and tightness.  Baseline: increased tightness along cervical paraspinals Goal status: INITIAL  5.  Patient will report 6426n FOTO to demonstrate improved functional ability.  Baseline: 53 Goal status: INITIAL   PLAN: PT FREQUENCY: 2x/week  PT DURATION: 8 weeks  PLANNED INTERVENTIONS: Therapeutic exercises, Therapeutic activity, Neuromuscular re-education, Balance training, Gait training, Patient/Family education, Self Care, Joint mobilization, Dry Needling, Spinal manipulation, Spinal mobilization, Cryotherapy, Moist heat, Taping, Vasopneumatic device, Traction, Ionotophoresis '4mg'$ /ml Dexamethasone, and Manual therapy  PLAN FOR NEXT SESSION: assess DN, try again to other areas as necessary, stretching and strengthening of upper back and cervical spine   MoAndris BaumannPT 12/21/2021, 3:47 PM     [id number]: [phone] Fax:  [phone] Details  Name: KaKENNETTA PAVLOVICRN: [id number] of Birth: 8/05-08-1957eferring Provider:  MuMarrian Salvage  Encounter Date: 12/21/2021   MoAndris BaumannPT 12/21/2021, 3:47 PM  [place]hone: [phone] Fax:  [phone]

## 2021-12-23 ENCOUNTER — Ambulatory Visit: Payer: Medicare Other

## 2021-12-23 DIAGNOSIS — M6281 Muscle weakness (generalized): Secondary | ICD-10-CM | POA: Diagnosis not present

## 2021-12-23 DIAGNOSIS — R519 Headache, unspecified: Secondary | ICD-10-CM | POA: Diagnosis not present

## 2021-12-23 DIAGNOSIS — G8929 Other chronic pain: Secondary | ICD-10-CM

## 2021-12-23 DIAGNOSIS — M542 Cervicalgia: Secondary | ICD-10-CM | POA: Diagnosis not present

## 2021-12-23 NOTE — Therapy (Signed)
OUTPATIENT PHYSICAL THERAPY CERVICAL TREATMENT   Patient Name: Veronica Galvan MRN: [id number] DOB:1956-04-10, 66 y.o., female Today's Date: 12/23/2021   PT End of Session - 12/23/21 1635     Visit Number 7    Date for PT Re-Evaluation 01/26/22    PT Start Time 1632    PT Stop Time 1715    PT Time Calculation (min) 43 min    Activity Tolerance Patient tolerated treatment well    Behavior During Therapy [facility] for tasks assessed/performed                 Past Medical History:  Diagnosis Date   Allergy    Anxiety    Arthritis    hands   Cataract    Diabetes mellitus without complication ([place])    type 2   Family history of adverse reaction to anesthesia    son has malignant hyperthermia, daughter does not daughter recently had c section without problems   GERD (gastroesophageal reflux disease)    Headache    sinus   Hyperlipidemia    Hypertension    PONV (postoperative nausea and vomiting)    nausea only   Ulcer, stomach peptic yrs ago   Past Surgical History:  Procedure Laterality Date   APPENDECTOMY     both hells bone spur repair     both heels with metal clips   both shoulder rotator cuff repair     CESAREAN SECTION     x 1   COLONOSCOPY WITH PROPOFOL N/A 09/07/2016   Procedure: COLONOSCOPY WITH PROPOFOL;  Surgeon: Garlan Fair, MD;  Location: [facility];  Service: Endoscopy;  Laterality: N/A;   colonscopy  06/2011   polyps   EYE SURGERY     FRACTURE SURGERY     TUBAL LIGATION     VESICO-VAGINAL FISTULA REPAIR     Patient Active Problem List   Diagnosis Date Noted   Gastroesophageal reflux disease 11/16/2021   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 08/18/2021   Dermatofibroma 08/18/2021   History of malignant neoplasm of skin 08/18/2021   Lentigo 08/18/2021   Melanocytic nevi of trunk 08/18/2021   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   Rosacea 08/18/2021   Actinic keratosis 08/18/2021   Sensorineural  hearing loss (SNHL) of left ear with unrestricted hearing of right ear 07/26/2021   Tinnitus of left ear 07/26/2021   Acute recurrent maxillary sinusitis 06/22/2021   Primary hypertension 01/12/2021   Hyperlipidemia associated with type 2 diabetes mellitus ([place]) 01/12/2021   Arthritis 01/12/2021   Type II diabetes mellitus ([place]) 11/25/2019   Ingrown toenail 09/05/2018   Neck pain 01/29/2018   Tick bite 10/24/2017    PCP: Jodi Mourning  REFERRING PROVIDER: Genia Harold  REFERRING DIAG: M54.2  THERAPY DIAG:  Cervicalgia  Muscle weakness (generalized)  Chronic intractable headache, unspecified headache type  Rationale for Evaluation and Treatment Rehabilitation  ONSET DATE: 11/08/21  SUBJECTIVE:  SUBJECTIVE STATEMENT: I think the 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 My shoulder and neck are still the same.   PERTINENT HISTORY:  Arthritis, HTN, bilateral rotator cuff surgery 15-35yr ago, DM  PAIN:  Are you having pain? Yes: NPRS scale: 7/10 Pain location: head and neck Pain description: dull, constant Aggravating factors: moving Relieving factors: massage, standing in the shower  WEIGHT BEARING RESTRICTIONS No  FALLS:  Has patient fallen in last 6 months? No  LIVING ENVIRONMENT: Lives with: lives alone Lives in: House/apartment Stairs: Yes: External: 3 steps; can reach both Has following equipment at home: None  OCCUPATION: reitred  PLOF: Independent  PATIENT GOALS to loosen it up, find out what is causing my headaches   OBJECTIVE:   DIAGNOSTIC FINDINGS:   FINDINGS: Brain: No acute infarct, mass effect or extra-axial collection. No acute or chronic hemorrhage. Normal white matter signal, parenchymal volume and CSF spaces. The midline  structures are normal.  Skull and upper cervical spine: Normal calvarium and skull base. Visualized upper cervical spine and soft tissues are normal.   Sinuses/Orbits:No paranasal sinus fluid levels or advanced mucosal thickening. No mastoid or middle ear effusion. Normal orbits.   IMPRESSION: Normal brain MRI.  PATIENT SURVEYS:  FOTO 53/100   COGNITION: Overall cognitive status: Within functional limits for tasks assessed  SENSATION: WFL  POSTURE: rounded shoulders, forward head, and increased thoracic kyphosis  PALPATION: TTP and sore along upper trap and cervical paraspinals, trigger points in R and L UT   CERVICAL ROM:   Active ROM A/PROM (deg) eval  Flexion 100% no pain  Extension Mild limitations w/pain  Right lateral flexion Mod limitation w/pain  Left lateral flexion Mod limitation w/pain  Right rotation 75% w/pain  Left rotation 75% w/pain    (Blank rows = not tested)  UPPER EXTREMITY ROM: WFL, limited in IR and ER due to prior RC surgery years ago   UPPER EXTREMITY MMT:  MMT Right eval Left eval  Shoulder flexion 4+ 4+  Shoulder extension    Shoulder abduction 4+ very painful 4+  Shoulder adduction    Shoulder extension    Shoulder internal rotation 4+ 4+  Shoulder external rotation    Middle trapezius    Lower trapezius    Elbow flexion 5 5  Elbow extension 5 5  Wrist flexion    Wrist extension    Wrist ulnar deviation    Wrist radial deviation    Wrist pronation    Wrist supination    Grip strength     (Blank rows = not tested)   TODAY'S TREATMENT:  12/23/21 UBE L2 x659ms  STM and UT stretch  Shoulder ext 5# x10, 10# x10 Cable rows 10# 2x10 ER/IR 5# 2x10   Suboccipital release  Pec stretch 30s   12/21/21 UBE L2 x6m70m  Seated rows 15# 2x10 Lat pull downs 25# 2x10 DN to rhomboids, UT, and TMJ  Shoulder ext 3# WaTE 2x10 3# shruggs 2x10 3# backward rolls 2x10 Shoulder ext 5# 2x10  12/16/21 UBE L2 x6mi5m Seated rows 15#  2x10 Lat pull downs 25# 2x10 Shoulder ext 5# 2x10 Cable rows 5# x10, 10# x10 2# IYT 2x10 Pec stretch 30s  Cerv mech traction with MH 12# 10 min  12/14/21 UBE L2x 6min37mry needling bilat UT, lateral pterygoid, and massester  MH to UT and cold pack to occiput 10min52m8/11/23 UBE 2 min fwd/2 min back L 2 Cerv retratcion head on ball 10  x 3 sec hold, then hold with 2# UE ex for stab Yellow tband 3 pt stab on way 10 each 2# shruggs,shld ext with scap squeeze and backward rolls 10 each standing Red tband row and shld ext 2 sets 10 Supine prom and end range stretching, suboccipital STW Cerv mech traction with MH 12 # 10 min     12/08/21   UBE L1 x74mns    Stretching UT, scalenes, cervical rotation and side bending    STM to bilat UT, scalenes, rhomboids   RedTB rows/ext 2x10   Shoulder flexion 1# WaTE 2x10   MH to upper back and upper traps 153ms   PATIENT EDUCATION:  Education details: POC Person educated: Patient Education method: Explanation Education comprehension: verbalized understanding   HOME EXERCISE PROGRAM: Access Code: [email or web address] URL: [email or web address] Date: 12/01/2021 Prepared by: MoAndris BaumannExercises - Seated Upper Trapezius Stretch  - 1 x daily - 7 x weekly - 2 sets - 30 hold - Seated Levator Scapulae Stretch  - 1 x daily - 7 x weekly - 2 sets - 30 hold  ASSESSMENT:  CLINICAL IMPRESSION: Continued with stretching and strengthening today. She still has lots of tightness in bilateral upper traps and frequent headaches. Responds well to suboccipital release, could try DN to this area as well as upper traps next visit.    REHAB POTENTIAL: Good  CLINICAL DECISION MAKING: Stable/uncomplicated  EVALUATION COMPLEXITY: Low   GOALS: Goals reviewed with patient? Yes  SHORT TERM GOALS: Target date: 12/29/21  Patient will be independent with initial HEP.  Goal status: INITIAL  LONG TERM GOALS: Target date: 01/26/22  Patient will be  independent with advanced/ongoing HEP to improve outcomes and carryover.  Goal status: INITIAL  2.  Patient will report 75% improvement in neck pain to improve QOL.  Goal status: INITIAL  3.  Patient will demonstrate full pain free cervical ROM to complete ADLs and play with grandchildren with more ease. Goal status: INITIAL  4.  Patient will demonstrate improved posture to decrease muscle imbalance and tightness.  Baseline: increased tightness along cervical paraspinals Goal status: INITIAL  5.  Patient will report 6476n FOTO to demonstrate improved functional ability.  Baseline: 53 Goal status: INITIAL   PLAN: PT FREQUENCY: 2x/week  PT DURATION: 8 weeks  PLANNED INTERVENTIONS: Therapeutic exercises, Therapeutic activity, Neuromuscular re-education, Balance training, Gait training, Patient/Family education, Self Care, Joint mobilization, Dry Needling, Spinal manipulation, Spinal mobilization, Cryotherapy, Moist heat, Taping, Vasopneumatic device, Traction, Ionotophoresis '4mg'$ /ml Dexamethasone, and Manual therapy  PLAN FOR NEXT SESSION: assess DN, try again to other areas as necessary, stretching and strengthening of upper back and cervical spine   MoAndris BaumannPT 12/23/2021, 5:16 PM     [id number]: [phone] Fax:  [phone] Details  Name: KaTEKESHIA KLAHRRN: [id number] of Birth: [birth year]eferring Provider:  MuMarrian Salvage  Encounter Date: 12/23/2021   MoAndris BaumannPT 12/23/2021, 5:16 PM  [id number]: [phone] Fax:  [phone]

## 2021-12-28 ENCOUNTER — Ambulatory Visit: Payer: Medicare Other

## 2021-12-28 DIAGNOSIS — M542 Cervicalgia: Secondary | ICD-10-CM | POA: Diagnosis not present

## 2021-12-28 DIAGNOSIS — R519 Headache, unspecified: Secondary | ICD-10-CM | POA: Diagnosis not present

## 2021-12-28 DIAGNOSIS — G8929 Other chronic pain: Secondary | ICD-10-CM | POA: Diagnosis not present

## 2021-12-28 DIAGNOSIS — M6281 Muscle weakness (generalized): Secondary | ICD-10-CM

## 2021-12-28 NOTE — Therapy (Signed)
OUTPATIENT PHYSICAL THERAPY CERVICAL TREATMENT   Patient Name: Veronica Galvan MRN: [id number] DOB:November 21, 1955, 66 y.o., female Today's Date: 12/28/2021   PT End of Session - 12/28/21 1503     Visit Number 8    Date for PT Re-Evaluation 01/26/22    PT Start Time 26    PT Stop Time 1604    PT Time Calculation (min) 61 min    Activity Tolerance Patient tolerated treatment well    Behavior During Therapy WFL for tasks assessed/performed                  Past Medical History:  Diagnosis Date   Allergy    Anxiety    Arthritis    hands   Cataract    Diabetes mellitus without complication ([place])    type 2   Family history of adverse reaction to anesthesia    son has malignant hyperthermia, daughter does not daughter recently had c section without problems   GERD (gastroesophageal reflux disease)    Headache    sinus   Hyperlipidemia    Hypertension    PONV (postoperative nausea and vomiting)    nausea only   Ulcer, stomach peptic yrs ago   Past Surgical History:  Procedure Laterality Date   APPENDECTOMY     both hells bone spur repair     both heels with metal clips   both shoulder rotator cuff repair     CESAREAN SECTION     x 1   COLONOSCOPY WITH PROPOFOL N/A 09/07/2016   Procedure: COLONOSCOPY WITH PROPOFOL;  Surgeon: Garlan Fair, MD;  Location: [facility];  Service: Endoscopy;  Laterality: N/A;   colonscopy  06/2011   polyps   EYE SURGERY     FRACTURE SURGERY     TUBAL LIGATION     VESICO-VAGINAL FISTULA REPAIR     Patient Active Problem List   Diagnosis Date Noted   Gastroesophageal reflux disease 11/16/2021   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 08/18/2021   Dermatofibroma 08/18/2021   History of malignant neoplasm of skin 08/18/2021   Lentigo 08/18/2021   Melanocytic nevi of trunk 08/18/2021   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   Rosacea 08/18/2021   Actinic keratosis 08/18/2021   Sensorineural  hearing loss (SNHL) of left ear with unrestricted hearing of right ear 07/26/2021   Tinnitus of left ear 07/26/2021   Acute recurrent maxillary sinusitis 06/22/2021   Primary hypertension 01/12/2021   Hyperlipidemia associated with type 2 diabetes mellitus ([place]) 01/12/2021   Arthritis 01/12/2021   Type II diabetes mellitus ([place]) 11/25/2019   Ingrown toenail 09/05/2018   Neck pain 01/29/2018   Tick bite 10/24/2017    PCP: Jodi Mourning  REFERRING PROVIDER: Genia Harold  REFERRING DIAG: M54.2  THERAPY DIAG:  Cervicalgia  Muscle weakness (generalized)  Chronic intractable headache, unspecified headache type  Rationale for Evaluation and Treatment Rehabilitation  ONSET DATE: 11/08/21  SUBJECTIVE:  SUBJECTIVE STATEMENT: Patient had a fall yesterday, slipped while under the house to get to her water heater. She slipped on the wet mud and fell backwards. Did not hit her head and no cuts, scrapes, or bruises. Was able to get herself up. Is in lots of pain on her R side on her back, 9/10. But in regards to her TMJ she thinks the DN is working because she is able to close her jaw which she couldn't do before.   PERTINENT HISTORY:  Arthritis, HTN, bilateral rotator cuff surgery 15-3yr ago, DM  PAIN:  Are you having pain? Yes: NPRS scale: 9/10 Pain location: head and neck Pain description: dull, constant Aggravating factors: moving Relieving factors: massage, standing in the shower  WEIGHT BEARING RESTRICTIONS No  FALLS:  Has patient fallen in last 6 months? No  LIVING ENVIRONMENT: Lives with: lives alone Lives in: House/apartment Stairs: Yes: External: 3 steps; can reach both Has following equipment at home: None  OCCUPATION: reitred  PLOF: Independent  PATIENT GOALS to  loosen it up, find out what is causing my headaches   OBJECTIVE:   DIAGNOSTIC FINDINGS:   FINDINGS: Brain: No acute infarct, mass effect or extra-axial collection. No acute or chronic hemorrhage. Normal white matter signal, parenchymal volume and CSF spaces. The midline structures are normal.  Skull and upper cervical spine: Normal calvarium and skull base. Visualized upper cervical spine and soft tissues are normal.   Sinuses/Orbits:No paranasal sinus fluid levels or advanced mucosal thickening. No mastoid or middle ear effusion. Normal orbits.   IMPRESSION: Normal brain MRI.  PATIENT SURVEYS:  FOTO 53/100   COGNITION: Overall cognitive status: Within functional limits for tasks assessed  SENSATION: WFL  POSTURE: rounded shoulders, forward head, and increased thoracic kyphosis  PALPATION: TTP and sore along upper trap and cervical paraspinals, trigger points in R and L UT   CERVICAL ROM:   Active ROM A/PROM (deg) eval  Flexion 100% no pain  Extension Mild limitations w/pain  Right lateral flexion Mod limitation w/pain  Left lateral flexion Mod limitation w/pain  Right rotation 75% w/pain  Left rotation 75% w/pain    (Blank rows = not tested)  UPPER EXTREMITY ROM: WFL, limited in IR and ER due to prior RC surgery years ago   UPPER EXTREMITY MMT:  MMT Right eval Left eval  Shoulder flexion 4+ 4+  Shoulder extension    Shoulder abduction 4+ very painful 4+  Shoulder adduction    Shoulder extension    Shoulder internal rotation 4+ 4+  Shoulder external rotation    Middle trapezius    Lower trapezius    Elbow flexion 5 5  Elbow extension 5 5  Wrist flexion    Wrist extension    Wrist ulnar deviation    Wrist radial deviation    Wrist pronation    Wrist supination    Grip strength     (Blank rows = not tested)   TODAY'S TREATMENT:  12/28/21 Nustep L2 x564ms  UBE L2 x5m32m  W backs against wall x10 Red ball roll up on wall x10 DN subocciptals,  masseter, upper traps  Cold pack 10 mins to R flank and low back   12/23/21 UBE L2 x6mi59m STM and UT stretch  Shoulder ext 5# x10, 10# x10 Cable rows 10# 2x10 ER/IR 5# 2x10   Suboccipital release  Pec stretch 30s   12/21/21 UBE L2 x6min34mSeated rows 15# 2x10 Lat pull downs 25# 2x10 DN to rhomboids, UT, and  TMJ  Shoulder ext 3# WaTE 2x10 3# shruggs 2x10 3# backward rolls 2x10 Shoulder ext 5# 2x10  12/16/21 UBE L2 x12mns  Seated rows 15# 2x10 Lat pull downs 25# 2x10 Shoulder ext 5# 2x10 Cable rows 5# x10, 10# x10 2# IYT 2x10 Pec stretch 30s  Cerv mech traction with MH 12# 10 min  12/14/21 UBE L2x 67ms Dry needling bilat UT, lateral pterygoid, and massester  MH to UT and cold pack to occiput 104m   12/10/21 UBE 2 min fwd/2 min back L 2 Cerv retratcion head on ball 10 x 3 sec hold, then hold with 2# UE ex for stab Yellow tband 3 pt stab on way 10 each 2# shruggs,shld ext with scap squeeze and backward rolls 10 each standing Red tband row and shld ext 2 sets 10 Supine prom and end range stretching, suboccipital STW Cerv mech traction with MH 12 # 10 min     12/08/21   UBE L1 x6mi81m   Stretching UT, scalenes, cervical rotation and side bending    STM to bilat UT, scalenes, rhomboids   RedTB rows/ext 2x10   Shoulder flexion 1# WaTE 2x10   MH to upper back and upper traps 10mi18m PATIENT EDUCATION:  Education details: POC Person educated: Patient Education method: Explanation Education comprehension: verbalized understanding   HOME EXERCISE PROGRAM: Access Code: [email or web address] URL: [email or web address] Date: 12/01/2021 Prepared by: Raydell Maners Andris Baumannrcises - Seated Upper Trapezius Stretch  - 1 x daily - 7 x weekly - 2 sets - 30 hold - Seated Levator Scapulae Stretch  - 1 x daily - 7 x weekly - 2 sets - 30 hold  ASSESSMENT:  CLINICAL IMPRESSION: Patient is in a lot of pain today, limiting her participation in session. This is due to her slip  yesterday, in which she landed on her back. She came to PT today because she did not want to miss out on getting dry needling. We worked on light movements as tolerated. Ended with cold back to R side back where she fell to help with pain and possible bruising.    REHAB POTENTIAL: Good  CLINICAL DECISION MAKING: Stable/uncomplicated  EVALUATION COMPLEXITY: Low   GOALS: Goals reviewed with patient? Yes  SHORT TERM GOALS: Target date: 12/29/21  Patient will be independent with initial HEP.  Goal status: MET  LONG TERM GOALS: Target date: 01/26/22  Patient will be independent with advanced/ongoing HEP to improve outcomes and carryover.  Goal status: INITIAL  2.  Patient will report 75% improvement in neck pain to improve QOL.  Goal status: INITIAL  3.  Patient will demonstrate full pain free cervical ROM to complete ADLs and play with grandchildren with more ease. Goal status: INITIAL  4.  Patient will demonstrate improved posture to decrease muscle imbalance and tightness.  Baseline: increased tightness along cervical paraspinals Goal status: INITIAL  5.  Patient will report 64 on80OTO to demonstrate improved functional ability.  Baseline: 53 Goal status: INITIAL   PLAN: PT FREQUENCY: 2x/week  PT DURATION: 8 weeks  PLANNED INTERVENTIONS: Therapeutic exercises, Therapeutic activity, Neuromuscular re-education, Balance training, Gait training, Patient/Family education, Self Care, Joint mobilization, Dry Needling, Spinal manipulation, Spinal mobilization, Cryotherapy, Moist heat, Taping, Vasopneumatic device, Traction, Ionotophoresis 4mg/m38mexamethasone, and Manual therapy  PLAN FOR NEXT SESSION: assess DN, try again to other areas as necessary, stretching and strengthening of upper back and cervical spine   Dhrithi Riche  Andris Baumann/29/2023, 3:58 PM     [facility]  [address]. [place], [place], [address] Phone:  [phone]   Fax:  [phone]  Patient Details  Name: LEGACI TARMAN MRN: [id number] Date of Birth: 1955/08/05 Referring Provider:  Marrian Salvage,*  Encounter Date: 12/28/2021   Andris Baumann, PT 12/28/2021, 3:58 PM  [address] [place], [place], [address] Phone: [phone]   Fax:  [phone]

## 2021-12-29 NOTE — Therapy (Signed)
OUTPATIENT PHYSICAL THERAPY CERVICAL TREATMENT   Patient Name: Veronica Galvan MRN: [id number] DOB:14-Sep-1955, 66 y.o., female Today's Date: 12/30/2021   PT End of Session - 12/30/21 1021     Visit Number 9    Date for PT Re-Evaluation 01/26/22    PT Start Time 1020    PT Stop Time 1107    PT Time Calculation (min) 47 min    Activity Tolerance Patient tolerated treatment well;Patient limited by pain    Behavior During Therapy [facility] for tasks assessed/performed                   Past Medical History:  Diagnosis Date   Allergy    Anxiety    Arthritis    hands   Cataract    Diabetes mellitus without complication ([place])    type 2   Family history of adverse reaction to anesthesia    son has malignant hyperthermia, daughter does not daughter recently had c section without problems   GERD (gastroesophageal reflux disease)    Headache    sinus   Hyperlipidemia    Hypertension    PONV (postoperative nausea and vomiting)    nausea only   Ulcer, stomach peptic yrs ago   Past Surgical History:  Procedure Laterality Date   APPENDECTOMY     both hells bone spur repair     both heels with metal clips   both shoulder rotator cuff repair     CESAREAN SECTION     x 1   COLONOSCOPY WITH PROPOFOL N/A 09/07/2016   Procedure: COLONOSCOPY WITH PROPOFOL;  Surgeon: Garlan Fair, MD;  Location: [facility];  Service: Endoscopy;  Laterality: N/A;   colonscopy  06/2011   polyps   EYE SURGERY     FRACTURE SURGERY     TUBAL LIGATION     VESICO-VAGINAL FISTULA REPAIR     Patient Active Problem List   Diagnosis Date Noted   Gastroesophageal reflux disease 11/16/2021   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 08/18/2021   Dermatofibroma 08/18/2021   History of malignant neoplasm of skin 08/18/2021   Lentigo 08/18/2021   Melanocytic nevi of trunk 08/18/2021   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   Rosacea 08/18/2021   Actinic keratosis  08/18/2021   Sensorineural hearing loss (SNHL) of left ear with unrestricted hearing of right ear 07/26/2021   Tinnitus of left ear 07/26/2021   Acute recurrent maxillary sinusitis 06/22/2021   Primary hypertension 01/12/2021   Hyperlipidemia associated with type 2 diabetes mellitus ([place]) 01/12/2021   Arthritis 01/12/2021   Type II diabetes mellitus ([place]) 11/25/2019   Ingrown toenail 09/05/2018   Neck pain 01/29/2018   Tick bite 10/24/2017    PCP: Jodi Mourning  REFERRING PROVIDER: Genia Harold  REFERRING DIAG: M54.2  THERAPY DIAG:  Cervicalgia  Muscle weakness (generalized)  Chronic intractable headache, unspecified headache type  Rationale for Evaluation and Treatment Rehabilitation  ONSET DATE: 11/08/21  SUBJECTIVE:  SUBJECTIVE STATEMENT: I am sore today because I just did too much yesterday.    PERTINENT HISTORY:  Arthritis, HTN, bilateral rotator cuff surgery 15-48yr ago, DM  PAIN:  Are you having pain? Yes: NPRS scale: 9/10 Pain location: head and neck Pain description: dull, constant Aggravating factors: moving Relieving factors: massage, standing in the shower  WEIGHT BEARING RESTRICTIONS No  FALLS:  Has patient fallen in last 6 months? No  LIVING ENVIRONMENT: Lives with: lives alone Lives in: House/apartment Stairs: Yes: External: 3 steps; can reach both Has following equipment at home: None  OCCUPATION: reitred  PLOF: Independent  PATIENT GOALS to loosen it up, find out what is causing my headaches   OBJECTIVE:   DIAGNOSTIC FINDINGS:   FINDINGS: Brain: No acute infarct, mass effect or extra-axial collection. No acute or chronic hemorrhage. Normal white matter signal, parenchymal volume and CSF spaces. The midline structures are  normal.  Skull and upper cervical spine: Normal calvarium and skull base. Visualized upper cervical spine and soft tissues are normal.   Sinuses/Orbits:No paranasal sinus fluid levels or advanced mucosal thickening. No mastoid or middle ear effusion. Normal orbits.   IMPRESSION: Normal brain MRI.  PATIENT SURVEYS:  FOTO 53/100   COGNITION: Overall cognitive status: Within functional limits for tasks assessed  SENSATION: WFL  POSTURE: rounded shoulders, forward head, and increased thoracic kyphosis  PALPATION: TTP and sore along upper trap and cervical paraspinals, trigger points in R and L UT   CERVICAL ROM:   Active ROM A/PROM (deg) eval 12/30/21  Flexion 100% no pain WFL  Extension Mild limitations w/pain WFL   Right lateral flexion Mod limitation w/pain Mild limitations  Left lateral flexion Mod limitation w/pain Mod limitations  Right rotation 75% w/pain Mild limitation w/pain  Left rotation 75% w/pain  Mild limitation w/pain    (Blank rows = not tested)  UPPER EXTREMITY ROM: WFL, limited in IR and ER due to prior RC surgery years ago   UPPER EXTREMITY MMT:  MMT Right eval Left eval  Shoulder flexion 4+ 4+  Shoulder extension    Shoulder abduction 4+ very painful 4+  Shoulder adduction    Shoulder extension    Shoulder internal rotation 4+ 4+  Shoulder external rotation    Middle trapezius    Lower trapezius    Elbow flexion 5 5  Elbow extension 5 5  Wrist flexion    Wrist extension    Wrist ulnar deviation    Wrist radial deviation    Wrist pronation    Wrist supination    Grip strength     (Blank rows = not tested)   TODAY'S TREATMENT:  12/30/21 UBE L2 x649ms w/power bursts  Recheck cervical ROM STM and stretching of cervical paraspinals  Shoulder flexion 6# WaTE 2x10  Chest fly w/ 3# 2x10  OHP yellow ball 2x10 YellowTB diagonals  Cold pack to R low back   12/28/21 Nustep L2 x5m71m  UBE L2 x5mi37m W backs against wall x10 Red ball  roll up on wall x10 DN subocciptals, masseter, upper traps  Cold pack 10 mins to R flank and low back   12/23/21 UBE L2 x6min65mSTM and UT stretch  Shoulder ext 5# x10, 10# x10 Cable rows 10# 2x10 ER/IR 5# 2x10   Suboccipital release  Pec stretch 30s   12/21/21 UBE L2 x6mins11meated rows 15# 2x10 Lat pull downs 25# 2x10 DN to rhomboids, UT, and TMJ  Shoulder ext 3# WaTE 2x10 3# shruggs  2x10 3# backward rolls 2x10 Shoulder ext 5# 2x10  12/16/21 UBE L2 x58mns  Seated rows 15# 2x10 Lat pull downs 25# 2x10 Shoulder ext 5# 2x10 Cable rows 5# x10, 10# x10 2# IYT 2x10 Pec stretch 30s  Cerv mech traction with MH 12# 10 min  12/14/21 UBE L2x 625ms Dry needling bilat UT, lateral pterygoid, and massester  MH to UT and cold pack to occiput 1081m   12/10/21 UBE 2 min fwd/2 min back L 2 Cerv retratcion head on ball 10 x 3 sec hold, then hold with 2# UE ex for stab Yellow tband 3 pt stab on way 10 each 2# shruggs,shld ext with scap squeeze and backward rolls 10 each standing Red tband row and shld ext 2 sets 10 Supine prom and end range stretching, suboccipital STW Cerv mech traction with MH 12 # 10 min     12/08/21   UBE L1 x6mi56m   Stretching UT, scalenes, cervical rotation and side bending    STM to bilat UT, scalenes, rhomboids   RedTB rows/ext 2x10   Shoulder flexion 1# WaTE 2x10   MH to upper back and upper traps 10mi57m PATIENT EDUCATION:  Education details: POC Person educated: Patient Education method: Explanation Education comprehension: verbalized understanding   HOME EXERCISE PROGRAM: Access Code: [email or web address] URL: [email or web address] Date: 12/01/2021 Prepared by: Kaydan Wilhoite Andris Baumannrcises - Seated Upper Trapezius Stretch  - 1 x daily - 7 x weekly - 2 sets - 30 hold - Seated Levator Scapulae Stretch  - 1 x daily - 7 x weekly - 2 sets - 30 hold  ASSESSMENT:  CLINICAL IMPRESSION: Patient is still a bit slow in movements today due to  bruising in low back from her recent fall. She expresses that her TMJ feels a lot better and is not as tight. She still has lots of upper trap tightness but it has decreased since she first began therapy. Patient request ice pack at end of visit to help with bruise on low back as it felt good last visit. Continue with strengthening, stretching, and pain management.    REHAB POTENTIAL: Good  CLINICAL DECISION MAKING: Stable/uncomplicated  EVALUATION COMPLEXITY: Low   GOALS: Goals reviewed with patient? Yes  SHORT TERM GOALS: Target date: 12/29/21  Patient will be independent with initial HEP.  Goal status: MET  LONG TERM GOALS: Target date: 01/26/22  Patient will be independent with advanced/ongoing HEP to improve outcomes and carryover.  Goal status: INITIAL  2.  Patient will report 75% improvement in neck pain to improve QOL.  Goal status: INITIAL  3.  Patient will demonstrate full pain free cervical ROM to complete ADLs and play with grandchildren with more ease. Goal status: INITIAL  4.  Patient will demonstrate improved posture to decrease muscle imbalance and tightness.  Baseline: increased tightness along cervical paraspinals Goal status: INITIAL  5.  Patient will report 64 on47OTO to demonstrate improved functional ability.  Baseline: 53 Goal status: INITIAL   PLAN: PT FREQUENCY: 2x/week  PT DURATION: 8 weeks  PLANNED INTERVENTIONS: Therapeutic exercises, Therapeutic activity, Neuromuscular re-education, Balance training, Gait training, Patient/Family education, Self Care, Joint mobilization, Dry Needling, Spinal manipulation, Spinal mobilization, Cryotherapy, Moist heat, Taping, Vasopneumatic device, Traction, Ionotophoresis 4mg/m63mexamethasone, and Manual therapy  PLAN FOR NEXT SESSION: assess DN, try again to other areas as necessary, stretching and strengthening of upper back and cervical spine   Kyana Aicher  Andris Baumann/31/2023, 11:10 AM     [facility]  [address] [place], [place], [address] Phone: [phone]   Fax:  [phone]  Patient Details  Name: JANAT TABBERT MRN: [id number] Date of Birth: 01-Jul-1955 Referring Provider:  Marrian Salvage,*  Encounter Date: 12/30/2021   Andris Baumann, PT 12/30/2021, 11:10 AM  [address] [place], [place], [address] Phone: [phone]   Fax:  [phone]

## 2021-12-30 ENCOUNTER — Ambulatory Visit: Payer: Medicare Other

## 2021-12-30 DIAGNOSIS — M6281 Muscle weakness (generalized): Secondary | ICD-10-CM | POA: Diagnosis not present

## 2021-12-30 DIAGNOSIS — R519 Headache, unspecified: Secondary | ICD-10-CM | POA: Diagnosis not present

## 2021-12-30 DIAGNOSIS — M542 Cervicalgia: Secondary | ICD-10-CM | POA: Diagnosis not present

## 2021-12-30 DIAGNOSIS — G8929 Other chronic pain: Secondary | ICD-10-CM | POA: Diagnosis not present

## 2022-01-04 NOTE — Therapy (Signed)
OUTPATIENT PHYSICAL THERAPY CERVICAL TREATMENT   Patient Name: Veronica Galvan MRN: [id number] DOB:[birth year], 66 y.o., female Today's Date: 01/05/2022   PT End of Session - 01/05/22 0933     Visit Number 10    Date for PT Re-Evaluation 01/26/22    PT Start Time 0933    PT Stop Time 1191    PT Time Calculation (min) 42 min    Activity Tolerance Patient tolerated treatment well    Behavior During Therapy [facility] for tasks assessed/performed           Progress Note Reporting Period 12/01/21 to 01/05/22  See note below for Objective Data and Assessment of Progress/Goals.             Past Medical History:  Diagnosis Date   Allergy    Anxiety    Arthritis    hands   Cataract    Diabetes mellitus without complication ([place])    type 2   Family history of adverse reaction to anesthesia    son has malignant hyperthermia, daughter does not daughter recently had c section without problems   GERD (gastroesophageal reflux disease)    Headache    sinus   Hyperlipidemia    Hypertension    PONV (postoperative nausea and vomiting)    nausea only   Ulcer, stomach peptic yrs ago   Past Surgical History:  Procedure Laterality Date   APPENDECTOMY     both hells bone spur repair     both heels with metal clips   both shoulder rotator cuff repair     CESAREAN SECTION     x 1   COLONOSCOPY WITH PROPOFOL N/A 09/07/2016   Procedure: COLONOSCOPY WITH PROPOFOL;  Surgeon: Garlan Fair, MD;  Location: [facility];  Service: Endoscopy;  Laterality: N/A;   colonscopy  06/2011   polyps   EYE SURGERY     FRACTURE SURGERY     TUBAL LIGATION     VESICO-VAGINAL FISTULA REPAIR     Patient Active Problem List   Diagnosis Date Noted   Gastroesophageal reflux disease 11/16/2021   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 08/18/2021   Dermatofibroma 08/18/2021   History of malignant neoplasm of skin 08/18/2021   Lentigo 08/18/2021   Melanocytic nevi of trunk  08/18/2021   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   Rosacea 08/18/2021   Actinic keratosis 08/18/2021   Sensorineural hearing loss (SNHL) of left ear with unrestricted hearing of right ear 07/26/2021   Tinnitus of left ear 07/26/2021   Acute recurrent maxillary sinusitis 06/22/2021   Primary hypertension 01/12/2021   Hyperlipidemia associated with type 2 diabetes mellitus ([place]) 01/12/2021   Arthritis 01/12/2021   Type II diabetes mellitus ([place]) 11/25/2019   Ingrown toenail 09/05/2018   Neck pain 01/29/2018   Tick bite 10/24/2017    PCP: Jodi Mourning  REFERRING PROVIDER: Genia Harold  REFERRING DIAG: M54.2  THERAPY DIAG:  Cervicalgia  Muscle weakness (generalized)  Chronic intractable headache, unspecified headache type  Rationale for Evaluation and Treatment Rehabilitation  ONSET DATE: 11/08/21  SUBJECTIVE:  SUBJECTIVE STATEMENT: I still feel very tight but it has gotten a little bit better.   PERTINENT HISTORY:  Arthritis, HTN, bilateral rotator cuff surgery 15-48yr ago, DM  PAIN:  Are you having pain? Yes: NPRS scale: 9/10 Pain location: head and neck Pain description: dull, constant Aggravating factors: moving Relieving factors: massage, standing in the shower  WEIGHT BEARING RESTRICTIONS No  FALLS:  Has patient fallen in last 6 months? No  LIVING ENVIRONMENT: Lives with: lives alone Lives in: House/apartment Stairs: Yes: External: 3 steps; can reach both Has following equipment at home: None  OCCUPATION: reitred  PLOF: Independent  PATIENT GOALS to loosen it up, find out what is causing my headaches   OBJECTIVE:   DIAGNOSTIC FINDINGS:   FINDINGS: Brain: No acute infarct, mass effect or extra-axial collection. No acute or chronic  hemorrhage. Normal white matter signal, parenchymal volume and CSF spaces. The midline structures are normal.  Skull and upper cervical spine: Normal calvarium and skull base. Visualized upper cervical spine and soft tissues are normal.   Sinuses/Orbits:No paranasal sinus fluid levels or advanced mucosal thickening. No mastoid or middle ear effusion. Normal orbits.   IMPRESSION: Normal brain MRI.  PATIENT SURVEYS:  FOTO 53/100   COGNITION: Overall cognitive status: Within functional limits for tasks assessed  SENSATION: WFL  POSTURE: rounded shoulders, forward head, and increased thoracic kyphosis  PALPATION: TTP and sore along upper trap and cervical paraspinals, trigger points in R and L UT   CERVICAL ROM:   Active ROM A/PROM (deg) eval 12/30/21 01/05/22  Flexion 100% no pain WFL WFL  Extension Mild limitations [facility]  Right lateral flexion Mod limitation w/pain Mild limitations Mild limitations  Left lateral flexion Mod limitation w/pain Mod limitations Mild limitations  Right rotation 75% w/pain Mild limitation w/pain WFL pain at end range   Left rotation 75% w/pain  Mild limitation w/pain  WFL pain at end range    (Blank rows = not tested)  UPPER EXTREMITY ROM: WFL, limited in IR and ER due to prior RC surgery years ago   UPPER EXTREMITY MMT:  MMT Right eval Left eval Right 01/05/22 Left 01/05/22  Shoulder flexion 4+ 4+ 5 5  Shoulder extension      Shoulder abduction 4+ very painful 4+ 4+ with pain 5  Shoulder adduction      Shoulder extension      Shoulder internal rotation 4+ 4+ 5 5  Shoulder external rotation      Middle trapezius      Lower trapezius      Elbow flexion 5 5    Elbow extension 5 5    Wrist flexion      Wrist extension      Wrist ulnar deviation      Wrist radial deviation      Wrist pronation      Wrist supination      Grip strength       (Blank rows = not tested)   TODAY'S TREATMENT:  01/05/22 Progress note- recheck MMT,  cervical ROM, FOTO  UBE L3 x667ms  DN to bilateral UT, TMJ, and suboccipitals    12/30/21 UBE L2 x6m71m w/power bursts  Recheck cervical ROM STM and stretching of cervical paraspinals  Shoulder flexion 6# WaTE 2x10  Chest fly w/ 3# 2x10  OHP yellow ball 2x10 YellowTB diagonals  Cold pack to R low back   12/28/21 Nustep L2 x5mi12m UBE L2 x5min26mW backs against wall x10 Red  ball roll up on wall x10 DN subocciptals, masseter, upper traps  Cold pack 10 mins to R flank and low back   12/23/21 UBE L2 x15mns  STM and UT stretch  Shoulder ext 5# x10, 10# x10 Cable rows 10# 2x10 ER/IR 5# 2x10   Suboccipital release  Pec stretch 30s   12/21/21 UBE L2 x683ms  Seated rows 15# 2x10 Lat pull downs 25# 2x10 DN to rhomboids, UT, and TMJ  Shoulder ext 3# WaTE 2x10 3# shruggs 2x10 3# backward rolls 2x10 Shoulder ext 5# 2x10  12/16/21 UBE L2 x6m61m  Seated rows 15# 2x10 Lat pull downs 25# 2x10 Shoulder ext 5# 2x10 Cable rows 5# x10, 10# x10 2# IYT 2x10 Pec stretch 30s  Cerv mech traction with MH 12# 10 min  12/14/21 UBE L2x 6mi47mDry needling bilat UT, lateral pterygoid, and massester  MH to UT and cold pack to occiput 10mi83m 12/10/21 UBE 2 min fwd/2 min back L 2 Cerv retratcion head on ball 10 x 3 sec hold, then hold with 2# UE ex for stab Yellow tband 3 pt stab on way 10 each 2# shruggs,shld ext with scap squeeze and backward rolls 10 each standing Red tband row and shld ext 2 sets 10 Supine prom and end range stretching, suboccipital STW Cerv mech traction with MH 12 # 10 min     12/08/21   UBE L1 x6mins77m Stretching UT, scalenes, cervical rotation and side bending    STM to bilat UT, scalenes, rhomboids   RedTB rows/ext 2x10   Shoulder flexion 1# WaTE 2x10   MH to upper back and upper traps 10mins37mATIENT EDUCATION:  Education details: POC Person educated: Patient Education method: Explanation Education comprehension: verbalized understanding   HOME  EXERCISE PROGRAM: Access Code: [email or web address] URL: [email or web address] Date: 12/01/2021 Prepared by: Veronica Galvan - Seated Upper Trapezius Stretch  - 1 x daily - 7 x weekly - 2 sets - 30 hold - Seated Levator Scapulae Stretch  - 1 x daily - 7 x weekly - 2 sets - 30 hold  ASSESSMENT:  CLINICAL IMPRESSION: Completed progress note today, patient has shown some improvements with cervical range and UE strength. She has also improved as demonstrated by her FOTO score. Did dry needling again, she will return tomorrow for another visit.    REHAB POTENTIAL: Good  CLINICAL DECISION MAKING: Stable/uncomplicated  EVALUATION COMPLEXITY: Low   GOALS: Goals reviewed with patient? Yes  SHORT TERM GOALS: Target date: 12/29/21  Patient will be independent with initial HEP.  Goal status: MET  LONG TERM GOALS: Target date: 01/26/22  Patient will be independent with advanced/ongoing HEP to improve outcomes and carryover.  Goal status: IN PROGRESS  2.  Patient will report 75% improvement in neck pain to improve QOL.   Baseline: 20% better 01/05/22 Goal status: IN PROGRESS  3.  Patient will demonstrate full pain free cervical ROM to complete ADLs and play with grandchildren with more ease. Goal status: IN PROGRESS  4.  Patient will demonstrate improved posture to decrease muscle imbalance and tightness.  Baseline: increased tightness along cervical paraspinals Goal status: IN PROGRESS  5.  Patient will report 64 on F73O to demonstrate improved functional ability.  Baseline: 53. 64-/01/05/22 Goal status: MET   PLAN: PT FREQUENCY: 2x/week  PT DURATION: 8 weeks  PLANNED INTERVENTIONS: Therapeutic exercises, Therapeutic activity, Neuromuscular re-education, Balance training, Gait training, Patient/Family education, Self Care, Joint mobilization, Dry Needling, Spinal manipulation, Spinal  mobilization, Cryotherapy, Moist heat, Taping, Vasopneumatic device, Traction,  Ionotophoresis 5m/ml Dexamethasone, and Manual therapy  PLAN FOR NEXT SESSION: assess DN, try again to other areas as necessary, stretching and strengthening of upper back and cervical spine   MAndris Baumann PT 01/05/2022, 10:16 AM     [place] [address]Phone: [phone]  Fax:  [phone] Patient Details  Name: Veronica LAZAROMRN: [id number] of Birth: 8Sep 19, 1957Referring Provider:  MMarrian Salvage*  Encounter Date: 01/05/2022   MAndris Baumann PT 01/05/2022, 10:16 AM  [place] [address]Phone: [phone]  Fax:  [phone]

## 2022-01-05 ENCOUNTER — Ambulatory Visit: Payer: Medicare Other | Attending: Psychiatry

## 2022-01-05 DIAGNOSIS — R519 Headache, unspecified: Secondary | ICD-10-CM | POA: Diagnosis not present

## 2022-01-05 DIAGNOSIS — G8929 Other chronic pain: Secondary | ICD-10-CM | POA: Diagnosis not present

## 2022-01-05 DIAGNOSIS — M542 Cervicalgia: Secondary | ICD-10-CM | POA: Diagnosis not present

## 2022-01-05 DIAGNOSIS — M6281 Muscle weakness (generalized): Secondary | ICD-10-CM | POA: Diagnosis not present

## 2022-01-05 NOTE — Therapy (Signed)
OUTPATIENT PHYSICAL THERAPY CERVICAL TREATMENT   Patient Name: Veronica Galvan MRN: [id number] DOB:1955/11/26, 66 y.o., female Today's Date: 01/05/2022    Past Medical History:  Diagnosis Date   Allergy    Anxiety    Arthritis    hands   Cataract    Diabetes mellitus without complication ([place])    type 2   Family history of adverse reaction to anesthesia    son has malignant hyperthermia, daughter does not daughter recently had c section without problems   GERD (gastroesophageal reflux disease)    Headache    sinus   Hyperlipidemia    Hypertension    PONV (postoperative nausea and vomiting)    nausea only   Ulcer, stomach peptic yrs ago   Past Surgical History:  Procedure Laterality Date   APPENDECTOMY     both hells bone spur repair     both heels with metal clips   both shoulder rotator cuff repair     CESAREAN SECTION     x 1   COLONOSCOPY WITH PROPOFOL N/A 09/07/2016   Procedure: COLONOSCOPY WITH PROPOFOL;  Surgeon: Garlan Fair, MD;  Location: [facility];  Service: Endoscopy;  Laterality: N/A;   colonscopy  06/2011   polyps   EYE SURGERY     FRACTURE SURGERY     TUBAL LIGATION     VESICO-VAGINAL FISTULA REPAIR     Patient Active Problem List   Diagnosis Date Noted   Gastroesophageal reflux disease 11/16/2021   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 08/18/2021   Dermatofibroma 08/18/2021   History of malignant neoplasm of skin 08/18/2021   Lentigo 08/18/2021   Melanocytic nevi of trunk 08/18/2021   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   Rosacea 08/18/2021   Actinic keratosis 08/18/2021   Sensorineural hearing loss (SNHL) of left ear with unrestricted hearing of right ear 07/26/2021   Tinnitus of left ear 07/26/2021   Acute recurrent maxillary sinusitis 06/22/2021   Primary hypertension 01/12/2021   Hyperlipidemia associated with type 2 diabetes mellitus ([place]) 01/12/2021   Arthritis 01/12/2021   Type II  diabetes mellitus ([place]) 11/25/2019   Ingrown toenail 09/05/2018   Neck pain 01/29/2018   Tick bite 10/24/2017    PCP: Jodi Mourning  REFERRING PROVIDER: Genia Harold  REFERRING DIAG: M54.2  THERAPY DIAG:  No diagnosis found.  Rationale for Evaluation and Treatment Rehabilitation  ONSET DATE: 11/08/21  SUBJECTIVE:  SUBJECTIVE STATEMENT: My massage therapist yesterday said she could really feel that my muscles are loosened up and flexible. I feel like my range has gotten better but I still have pain. Headaches are less intense.   PERTINENT HISTORY:  Arthritis, HTN, bilateral rotator cuff surgery 15-47yrs ago, DM  PAIN:  Are you having pain? Yes: NPRS scale: 8/10 Pain location: head and neck Pain description: dull, constant Aggravating factors: moving Relieving factors: massage, standing in the shower  WEIGHT BEARING RESTRICTIONS No  FALLS:  Has patient fallen in last 6 months? No  LIVING ENVIRONMENT: Lives with: lives alone Lives in: House/apartment Stairs: Yes: External: 3 steps; can reach both Has following equipment at home: None  OCCUPATION: reitred  PLOF: Independent  PATIENT GOALS to loosen it up, find out what is causing my headaches   OBJECTIVE:   DIAGNOSTIC FINDINGS:   FINDINGS: Brain: No acute infarct, mass effect or extra-axial collection. No acute or chronic hemorrhage. Normal white matter signal, parenchymal volume and CSF spaces. The midline structures are normal.  Skull and upper cervical spine: Normal calvarium and skull base. Visualized upper cervical spine and soft tissues are normal.   Sinuses/Orbits:No paranasal sinus fluid levels or advanced mucosal thickening. No mastoid or middle ear effusion. Normal orbits.   IMPRESSION: Normal brain  MRI.  PATIENT SURVEYS:  FOTO 53/100   COGNITION: Overall cognitive status: Within functional limits for tasks assessed  SENSATION: WFL  POSTURE: rounded shoulders, forward head, and increased thoracic kyphosis  PALPATION: TTP and sore along upper trap and cervical paraspinals, trigger points in R and L UT   CERVICAL ROM:   Active ROM A/PROM (deg) eval 12/30/21 01/05/22  Flexion 100% no pain WFL WFL  Extension Mild limitations w/pain [facility]  WFL  Right lateral flexion Mod limitation w/pain Mild limitations Mild limitations  Left lateral flexion Mod limitation w/pain Mod limitations Mild limitations  Right rotation 75% w/pain Mild limitation w/pain WFL pain at end range   Left rotation 75% w/pain  Mild limitation w/pain  WFL pain at end range    (Blank rows = not tested)  UPPER EXTREMITY ROM: WFL, limited in IR and ER due to prior RC surgery years ago   UPPER EXTREMITY MMT:  MMT Right eval Left eval Right 01/05/22 Left 01/05/22  Shoulder flexion 4+ 4+ 5 5  Shoulder extension      Shoulder abduction 4+ very painful 4+ 4+ with pain 5  Shoulder adduction      Shoulder extension      Shoulder internal rotation 4+ 4+ 5 5  Shoulder external rotation      Middle trapezius      Lower trapezius      Elbow flexion 5 5    Elbow extension 5 5    Wrist flexion      Wrist extension      Wrist ulnar deviation      Wrist radial deviation      Wrist pronation      Wrist supination      Grip strength       (Blank rows = not tested)   TODAY'S TREATMENT:  01/06/22 UBE L2 x55mins  STM, Passive stretching  Rows and lats 20# 2x10  RedTB horizontal ABD 2x10 ABD yellow band shoulder flexion 2x10 3 way shoulder yellow band 2x10   01/05/22 Progress note- recheck MMT, cervical ROM, FOTO  UBE L3 x53mins  DN to bilateral UT, TMJ, and suboccipitals    12/30/21 UBE L2 x80mins w/power bursts  Recheck cervical ROM STM and stretching of cervical paraspinals  Shoulder flexion 6# WaTE 2x10   Chest fly w/ 3# 2x10  OHP yellow ball 2x10 YellowTB diagonals  Cold pack to R low back   12/28/21 Nustep L2 x82mins  UBE L2 x83mins  W backs against wall x10 Red ball roll up on wall x10 DN subocciptals, masseter, upper traps  Cold pack 10 mins to R flank and low back   12/23/21 UBE L2 x23mins  STM and UT stretch  Shoulder ext 5# x10, 10# x10 Cable rows 10# 2x10 ER/IR 5# 2x10   Suboccipital release  Pec stretch 30s   12/21/21 UBE L2 x16mins  Seated rows 15# 2x10 Lat pull downs 25# 2x10 DN to rhomboids, UT, and TMJ  Shoulder ext 3# WaTE 2x10 3# shruggs 2x10 3# backward rolls 2x10 Shoulder ext 5# 2x10  12/16/21 UBE L2 x21mins  Seated rows 15# 2x10 Lat pull downs 25# 2x10 Shoulder ext 5# 2x10 Cable rows 5# x10, 10# x10 2# IYT 2x10 Pec stretch 30s  Cerv mech traction with MH 12# 10 min  12/14/21 UBE L2x 70mins Dry needling bilat UT, lateral pterygoid, and massester  MH to UT and cold pack to occiput 53mins   12/10/21 UBE 2 min fwd/2 min back L 2 Cerv retratcion head on ball 10 x 3 sec hold, then hold with 2# UE ex for stab Yellow tband 3 pt stab on way 10 each 2# shruggs,shld ext with scap squeeze and backward rolls 10 each standing Red tband row and shld ext 2 sets 10 Supine prom and end range stretching, suboccipital STW Cerv mech traction with MH 12 # 10 min     12/08/21   UBE L1 x13mins    Stretching UT, scalenes, cervical rotation and side bending    STM to bilat UT, scalenes, rhomboids   RedTB rows/ext 2x10   Shoulder flexion 1# WaTE 2x10   MH to upper back and upper traps 49mins   PATIENT EDUCATION:  Education details: POC Person educated: Patient Education method: Explanation Education comprehension: verbalized understanding   HOME EXERCISE PROGRAM: Access Code: [email or web address] URL: [email or web address] Date: 12/01/2021 Prepared by: Andris Baumann  Exercises - Seated Upper Trapezius Stretch  - 1 x daily - 7 x weekly - 2 sets - 30 hold -  Seated Levator Scapulae Stretch  - 1 x daily - 7 x weekly - 2 sets - 30 hold  ASSESSMENT:  CLINICAL IMPRESSION: Patient doing well after dry needling and massage yesterday. We continued to work on some stretches and strengthening.    REHAB POTENTIAL: Good  CLINICAL DECISION MAKING: Stable/uncomplicated  EVALUATION COMPLEXITY: Low   GOALS: Goals reviewed with patient? Yes  SHORT TERM GOALS: Target date: 12/29/21  Patient will be independent with initial HEP.  Goal status: MET  LONG TERM GOALS: Target date: 01/26/22  Patient will be independent with advanced/ongoing HEP to improve outcomes and carryover.  Goal status: IN PROGRESS  2.  Patient will report 75% improvement in neck pain to improve QOL.   Baseline: 20% better 01/05/22 Goal status: IN PROGRESS  3.  Patient will demonstrate full pain free cervical ROM to complete ADLs and play with grandchildren with more ease. Goal status: IN PROGRESS  4.  Patient will demonstrate improved posture to decrease muscle imbalance and tightness.  Baseline: increased tightness along cervical paraspinals Goal status: IN PROGRESS  5.  Patient will report 38 on FOTO to demonstrate improved functional ability.  Baseline: 53. 64-/01/05/22 Goal status: MET  PLAN: PT FREQUENCY: 2x/week  PT DURATION: 8 weeks  PLANNED INTERVENTIONS: Therapeutic exercises, Therapeutic activity, Neuromuscular re-education, Balance training, Gait training, Patient/Family education, Self Care, Joint mobilization, Dry Needling, Spinal manipulation, Spinal mobilization, Cryotherapy, Moist heat, Taping, Vasopneumatic device, Traction, Ionotophoresis 19m/ml Dexamethasone, and Manual therapy  PLAN FOR NEXT SESSION: assess DN, try again to other areas as necessary, stretching and strengthening of upper back and cervical spine   MAndris Baumann PT 01/05/2022, 1:47 PM     [place] [address]Phone: [phone]  Fax:  [phone] Patient Details  Name: Veronica DICKENSONMRN: [id number] of Birth: 805-10-1957Referring Provider:  MMarrian Salvage*  Encounter Date: 01/06/2022   MAndris Baumann PT 01/05/2022, 1:47 PM  [place] [address]Phone: [phone]  Fax:  [phone]

## 2022-01-06 ENCOUNTER — Ambulatory Visit: Payer: Medicare Other

## 2022-01-06 DIAGNOSIS — G8929 Other chronic pain: Secondary | ICD-10-CM

## 2022-01-06 DIAGNOSIS — M6281 Muscle weakness (generalized): Secondary | ICD-10-CM

## 2022-01-06 DIAGNOSIS — M542 Cervicalgia: Secondary | ICD-10-CM

## 2022-01-10 NOTE — Therapy (Signed)
OUTPATIENT PHYSICAL THERAPY CERVICAL TREATMENT   Patient Name: Veronica Galvan MRN: [id number] DOB:October 18, 1955, 66 y.o., female Today's Date: 01/11/2022    Past Medical History:  Diagnosis Date   Allergy    Anxiety    Arthritis    hands   Cataract    Diabetes mellitus without complication ([place])    type 2   Family history of adverse reaction to anesthesia    son has malignant hyperthermia, daughter does not daughter recently had c section without problems   GERD (gastroesophageal reflux disease)    Headache    sinus   Hyperlipidemia    Hypertension    PONV (postoperative nausea and vomiting)    nausea only   Ulcer, stomach peptic yrs ago   Past Surgical History:  Procedure Laterality Date   APPENDECTOMY     both hells bone spur repair     both heels with metal clips   both shoulder rotator cuff repair     CESAREAN SECTION     x 1   COLONOSCOPY WITH PROPOFOL N/A 09/07/2016   Procedure: COLONOSCOPY WITH PROPOFOL;  Surgeon: Garlan Fair, MD;  Location: [facility];  Service: Endoscopy;  Laterality: N/A;   colonscopy  06/2011   polyps   EYE SURGERY     FRACTURE SURGERY     TUBAL LIGATION     VESICO-VAGINAL FISTULA REPAIR     Patient Active Problem List   Diagnosis Date Noted   Gastroesophageal reflux disease 11/16/2021   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 08/18/2021   Dermatofibroma 08/18/2021   History of malignant neoplasm of skin 08/18/2021   Lentigo 08/18/2021   Melanocytic nevi of trunk 08/18/2021   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   Rosacea 08/18/2021   Actinic keratosis 08/18/2021   Sensorineural hearing loss (SNHL) of left ear with unrestricted hearing of right ear 07/26/2021   Tinnitus of left ear 07/26/2021   Acute recurrent maxillary sinusitis 06/22/2021   Primary hypertension 01/12/2021   Hyperlipidemia associated with type 2 diabetes mellitus ([place]) 01/12/2021   Arthritis 01/12/2021   Type II  diabetes mellitus ([place]) 11/25/2019   Ingrown toenail 09/05/2018   Neck pain 01/29/2018   Tick bite 10/24/2017    PCP: Jodi Mourning  REFERRING PROVIDER: Genia Harold  REFERRING DIAG: M54.2  THERAPY DIAG:  Cervicalgia  Muscle weakness (generalized)  Chronic intractable headache, unspecified headache type  Rationale for Evaluation and Treatment Rehabilitation  ONSET DATE: 11/08/21  SUBJECTIVE:  SUBJECTIVE STATEMENT: Still stiff, still having headaches but not as intense.   PERTINENT HISTORY:  Arthritis, HTN, bilateral rotator cuff surgery 15-86yr ago, DM  PAIN:  Are you having pain? Yes: NPRS scale: 8/10 Pain location: head and neck Pain description: dull, constant Aggravating factors: moving Relieving factors: massage, standing in the shower  WEIGHT BEARING RESTRICTIONS No  FALLS:  Has patient fallen in last 6 months? No  LIVING ENVIRONMENT: Lives with: lives alone Lives in: House/apartment Stairs: Yes: External: 3 steps; can reach both Has following equipment at home: None  OCCUPATION: reitred  PLOF: Independent  PATIENT GOALS to loosen it up, find out what is causing my headaches   OBJECTIVE:   DIAGNOSTIC FINDINGS:   FINDINGS: Brain: No acute infarct, mass effect or extra-axial collection. No acute or chronic hemorrhage. Normal white matter signal, parenchymal volume and CSF spaces. The midline structures are normal.  Skull and upper cervical spine: Normal calvarium and skull base. Visualized upper cervical spine and soft tissues are normal.   Sinuses/Orbits:No paranasal sinus fluid levels or advanced mucosal thickening. No mastoid or middle ear effusion. Normal orbits.   IMPRESSION: Normal brain MRI.  PATIENT SURVEYS:  FOTO  53/100   COGNITION: Overall cognitive status: Within functional limits for tasks assessed  SENSATION: WFL  POSTURE: rounded shoulders, forward head, and increased thoracic kyphosis  PALPATION: TTP and sore along upper trap and cervical paraspinals, trigger points in R and L UT   CERVICAL ROM:   Active ROM A/PROM (deg) eval 12/30/21 01/05/22  Flexion 100% no pain WFL WFL  Extension Mild limitations w/pain [facility] W[facility]  Right lateral flexion Mod limitation w/pain Mild limitations Mild limitations  Left lateral flexion Mod limitation w/pain Mod limitations Mild limitations  Right rotation 75% w/pain Mild limitation w/pain WFL pain at end range   Left rotation 75% w/pain  Mild limitation w/pain  WFL pain at end range    (Blank rows = not tested)  UPPER EXTREMITY ROM: WFL, limited in IR and ER due to prior RC surgery years ago   UPPER EXTREMITY MMT:  MMT Right eval Left eval Right 01/05/22 Left 01/05/22  Shoulder flexion 4+ 4+ 5 5  Shoulder extension      Shoulder abduction 4+ very painful 4+ 4+ with pain 5  Shoulder adduction      Shoulder extension      Shoulder internal rotation 4+ 4+ 5 5  Shoulder external rotation      Middle trapezius      Lower trapezius      Elbow flexion 5 5    Elbow extension 5 5    Wrist flexion      Wrist extension      Wrist ulnar deviation      Wrist radial deviation      Wrist pronation      Wrist supination      Grip strength       (Blank rows = not tested)   TODAY'S TREATMENT:  01/11/22 UBE L3 x678ms redTB horizontal 2x10  Shoulder ext 15# 2x10 Cable rows 10# 2x10 Lateral raises 3# 2x10 Shoulder flexion 3# 2x10  Shoulder diagonals 5# 2x10  DN to UT, subocciptials, TMJ  01/06/22 UBE L2 x6m64m  STM, Passive stretching  Rows and lats 20# 2x10  RedTB horizontal ABD 2x10 ABD yellow band shoulder flexion 2x10 3 way shoulder yellow band 2x10   01/05/22 Progress note- recheck MMT, cervical ROM, FOTO  UBE L3 x6mi58m DN to bilateral  UT,  TMJ, and suboccipitals    12/30/21 UBE L2 x67mns w/power bursts  Recheck cervical ROM STM and stretching of cervical paraspinals  Shoulder flexion 6# WaTE 2x10  Chest fly w/ 3# 2x10  OHP yellow ball 2x10 YellowTB diagonals  Cold pack to R low back   12/28/21 Nustep L2 x57ms  UBE L2 x5m71m  W backs against wall x10 Red ball roll up on wall x10 DN subocciptals, masseter, upper traps  Cold pack 10 mins to R flank and low back   12/23/21 UBE L2 x6mi80m STM and UT stretch  Shoulder ext 5# x10, 10# x10 Cable rows 10# 2x10 ER/IR 5# 2x10   Suboccipital release  Pec stretch 30s   12/21/21 UBE L2 x6min61mSeated rows 15# 2x10 Lat pull downs 25# 2x10 DN to rhomboids, UT, and TMJ  Shoulder ext 3# WaTE 2x10 3# shruggs 2x10 3# backward rolls 2x10 Shoulder ext 5# 2x10  12/16/21 UBE L2 x6mins18meated rows 15# 2x10 Lat pull downs 25# 2x10 Shoulder ext 5# 2x10 Cable rows 5# x10, 10# x10 2# IYT 2x10 Pec stretch 30s  Cerv mech traction with MH 12# 10 min  12/14/21 UBE L2x 6mins 53m needling bilat UT, lateral pterygoid, and massester  MH to UT and cold pack to occiput 10mins 75m11/23 UBE 2 min fwd/2 min back L 2 Cerv retratcion head on ball 10 x 3 sec hold, then hold with 2# UE ex for stab Yellow tband 3 pt stab on way 10 each 2# shruggs,shld ext with scap squeeze and backward rolls 10 each standing Red tband row and shld ext 2 sets 10 Supine prom and end range stretching, suboccipital STW Cerv mech traction with MH 12 # 10 min     12/08/21   UBE L1 x6mins   68mretching UT, scalenes, cervical rotation and side bending    STM to bilat UT, scalenes, rhomboids   RedTB rows/ext 2x10   Shoulder flexion 1# WaTE 2x10   MH to upper back and upper traps 10mins   43mENT EDUCATION:  Education details: POC Person educated: Patient Education method: Explanation Education comprehension: verbalized understanding   HOME EXERCISE PROGRAM: Access Code: [email or web address] URL:  [email or web address] Date: 12/01/2021 Prepared by: Braxley Balandran SajjaAndris Baumanns - Seated Upper Trapezius Stretch  - 1 x daily - 7 x weekly - 2 sets - 30 hold - Seated Levator Scapulae Stretch  - 1 x daily - 7 x weekly - 2 sets - 30 hold  ASSESSMENT:  CLINICAL IMPRESSION: Continued working on neck and UE strengthening. Still presents with lots of stiffness. DN again today to upper traps, suboccipitals, and TMJ musculature.    REHAB POTENTIAL: Good  CLINICAL DECISION MAKING: Stable/uncomplicated  EVALUATION COMPLEXITY: Low   GOALS: Goals reviewed with patient? Yes  SHORT TERM GOALS: Target date: 12/29/21  Patient will be independent with initial HEP.  Goal status: MET  LONG TERM GOALS: Target date: 01/26/22  Patient will be independent with advanced/ongoing HEP to improve outcomes and carryover.  Goal status: IN PROGRESS  2.  Patient will report 75% improvement in neck pain to improve QOL.   Baseline: 20% better 01/05/22 Goal status: IN PROGRESS  3.  Patient will demonstrate full pain free cervical ROM to complete ADLs and play with grandchildren with more ease. Goal status: IN PROGRESS  4.  Patient will demonstrate improved posture to decrease muscle imbalance and tightness.  Baseline: increased tightness along cervical paraspinals Goal status: IN PROGRESS  5.  Patient  will report 14 on FOTO to demonstrate improved functional ability.  Baseline: 53. 64-/01/05/22 Goal status: MET   PLAN: PT FREQUENCY: 2x/week  PT DURATION: 8 weeks  PLANNED INTERVENTIONS: Therapeutic exercises, Therapeutic activity, Neuromuscular re-education, Balance training, Gait training, Patient/Family education, Self Care, Joint mobilization, Dry Needling, Spinal manipulation, Spinal mobilization, Cryotherapy, Moist heat, Taping, Vasopneumatic device, Traction, Ionotophoresis 71m/ml Dexamethasone, and Manual therapy  PLAN FOR NEXT SESSION: assess DN, try again to other areas as  necessary, stretching and strengthening of upper back and cervical spine   MAndris Baumann PT 01/11/2022, 10:16 AM     [place] [address]Phone: [phone]  Fax:  [phone] Patient Details  Name: Veronica PINETTEMRN: [id number] of Birth: [birth year]Referring Provider:  MMarrian Salvage*  Encounter Date: 01/11/2022   MAndris Baumann PT 01/11/2022, 10:16 AM  [place] [address]Phone: [phone]  Fax:  [phone]

## 2022-01-11 ENCOUNTER — Ambulatory Visit: Payer: Medicare Other

## 2022-01-11 DIAGNOSIS — M542 Cervicalgia: Secondary | ICD-10-CM

## 2022-01-11 DIAGNOSIS — M6281 Muscle weakness (generalized): Secondary | ICD-10-CM | POA: Diagnosis not present

## 2022-01-11 DIAGNOSIS — R519 Headache, unspecified: Secondary | ICD-10-CM | POA: Diagnosis not present

## 2022-01-11 DIAGNOSIS — G8929 Other chronic pain: Secondary | ICD-10-CM

## 2022-01-12 NOTE — Therapy (Signed)
OUTPATIENT PHYSICAL THERAPY CERVICAL TREATMENT   Patient Name: Veronica Galvan MRN: [id number] DOB:Jul 04, 1955, 66 y.o., female Today's Date: 01/13/2022    Past Medical History:  Diagnosis Date   Allergy    Anxiety    Arthritis    hands   Cataract    Diabetes mellitus without complication ([place])    type 2   Family history of adverse reaction to anesthesia    son has malignant hyperthermia, daughter does not daughter recently had c section without problems   GERD (gastroesophageal reflux disease)    Headache    sinus   Hyperlipidemia    Hypertension    PONV (postoperative nausea and vomiting)    nausea only   Ulcer, stomach peptic yrs ago   Past Surgical History:  Procedure Laterality Date   APPENDECTOMY     both hells bone spur repair     both heels with metal clips   both shoulder rotator cuff repair     CESAREAN SECTION     x 1   COLONOSCOPY WITH PROPOFOL N/A 09/07/2016   Procedure: COLONOSCOPY WITH PROPOFOL;  Surgeon: Garlan Fair, MD;  Location: [facility];  Service: Endoscopy;  Laterality: N/A;   colonscopy  06/2011   polyps   EYE SURGERY     FRACTURE SURGERY     TUBAL LIGATION     VESICO-VAGINAL FISTULA REPAIR     Patient Active Problem List   Diagnosis Date Noted   Gastroesophageal reflux disease 11/16/2021   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 08/18/2021   Dermatofibroma 08/18/2021   History of malignant neoplasm of skin 08/18/2021   Lentigo 08/18/2021   Melanocytic nevi of trunk 08/18/2021   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   Rosacea 08/18/2021   Actinic keratosis 08/18/2021   Sensorineural hearing loss (SNHL) of left ear with unrestricted hearing of right ear 07/26/2021   Tinnitus of left ear 07/26/2021   Acute recurrent maxillary sinusitis 06/22/2021   Primary hypertension 01/12/2021   Hyperlipidemia associated with type 2 diabetes mellitus ([place]) 01/12/2021   Arthritis 01/12/2021   Type II  diabetes mellitus ([place]) 11/25/2019   Ingrown toenail 09/05/2018   Neck pain 01/29/2018   Tick bite 10/24/2017    PCP: Jodi Mourning  REFERRING PROVIDER: Genia Harold  REFERRING DIAG: M54.2  THERAPY DIAG:  Cervicalgia  Muscle weakness (generalized)  Chronic intractable headache, unspecified headache type  Rationale for Evaluation and Treatment Rehabilitation  ONSET DATE: 11/08/21  SUBJECTIVE:  SUBJECTIVE STATEMENT: Feel the same, still very stiff. I think I need to see a doctor to see if I have arthritis in my neck.   PERTINENT HISTORY:  Arthritis, HTN, bilateral rotator cuff surgery 15-60yr ago, DM  PAIN:  Are you having pain? Yes: NPRS scale: 8/10 Pain location: head and neck Pain description: dull, constant Aggravating factors: moving Relieving factors: massage, standing in the shower  WEIGHT BEARING RESTRICTIONS No  FALLS:  Has patient fallen in last 6 months? No  LIVING ENVIRONMENT: Lives with: lives alone Lives in: House/apartment Stairs: Yes: External: 3 steps; can reach both Has following equipment at home: None  OCCUPATION: reitred  PLOF: Independent  PATIENT GOALS to loosen it up, find out what is causing my headaches   OBJECTIVE:   DIAGNOSTIC FINDINGS:   FINDINGS: Brain: No acute infarct, mass effect or extra-axial collection. No acute or chronic hemorrhage. Normal white matter signal, parenchymal volume and CSF spaces. The midline structures are normal.  Skull and upper cervical spine: Normal calvarium and skull base. Visualized upper cervical spine and soft tissues are normal.   Sinuses/Orbits:No paranasal sinus fluid levels or advanced mucosal thickening. No mastoid or middle ear effusion. Normal orbits.   IMPRESSION: Normal brain  MRI.  PATIENT SURVEYS:  FOTO 53/100   COGNITION: Overall cognitive status: Within functional limits for tasks assessed  SENSATION: WFL  POSTURE: rounded shoulders, forward head, and increased thoracic kyphosis  PALPATION: TTP and sore along upper trap and cervical paraspinals, trigger points in R and L UT   CERVICAL ROM:   Active ROM A/PROM (deg) eval 12/30/21 01/05/22  Flexion 100% no pain WFL WFL  Extension Mild limitations w/[facility]  Right lateral flexion Mod limitation w/pain Mild limitations Mild limitations  Left lateral flexion Mod limitation w/pain Mod limitations Mild limitations  Right rotation 75% w/pain Mild limitation w/pain WFL pain at end range   Left rotation 75% w/pain  Mild limitation w/pain  WFL pain at end range    (Blank rows = not tested)  UPPER EXTREMITY ROM: WFL, limited in IR and ER due to prior RC surgery years ago   UPPER EXTREMITY MMT:  MMT Right eval Left eval Right 01/05/22 Left 01/05/22  Shoulder flexion 4+ 4+ 5 5  Shoulder extension      Shoulder abduction 4+ very painful 4+ 4+ with pain 5  Shoulder adduction      Shoulder extension      Shoulder internal rotation 4+ 4+ 5 5  Shoulder external rotation      Middle trapezius      Lower trapezius      Elbow flexion 5 5    Elbow extension 5 5    Wrist flexion      Wrist extension      Wrist ulnar deviation      Wrist radial deviation      Wrist pronation      Wrist supination      Grip strength       (Blank rows = not tested)   TODAY'S TREATMENT:  01/13/22 UBE L4 x627ms  Pec stretch with cables and in doorway 30s  Rows 25# 2x10 Lats 25# 2x10 Body blade x10 into flexion  Scapular lift offs w/red ball x10  Massage with gun to UT, rhomboids   01/11/22 UBE L3 x6m37m redTB horizontal 2x10  Shoulder ext 15# 2x10 Cable rows 10# 2x10 Lateral raises 3# 2x10 Shoulder flexion 3# 2x10  Shoulder diagonals 5# 2x10  DN to  UT, subocciptials, TMJ  01/06/22 UBE L2 x84mns  STM,  Passive stretching  Rows and lats 20# 2x10  RedTB horizontal ABD 2x10 ABD yellow band shoulder flexion 2x10 3 way shoulder yellow band 2x10   01/05/22 Progress note- recheck MMT, cervical ROM, FOTO  UBE L3 x620ms  DN to bilateral UT, TMJ, and suboccipitals    12/30/21 UBE L2 x6m33m w/power bursts  Recheck cervical ROM STM and stretching of cervical paraspinals  Shoulder flexion 6# WaTE 2x10  Chest fly w/ 3# 2x10  OHP yellow ball 2x10 YellowTB diagonals  Cold pack to R low back   12/28/21 Nustep L2 x5mi87m UBE L2 x5min59mW backs against wall x10 Red ball roll up on wall x10 DN subocciptals, masseter, upper traps  Cold pack 10 mins to R flank and low back   12/23/21 UBE L2 x6mins55mTM and UT stretch  Shoulder ext 5# x10, 10# x10 Cable rows 10# 2x10 ER/IR 5# 2x10   Suboccipital release  Pec stretch 30s   12/21/21 UBE L2 x6mins 1mated rows 15# 2x10 Lat pull downs 25# 2x10 DN to rhomboids, UT, and TMJ  Shoulder ext 3# WaTE 2x10 3# shruggs 2x10 3# backward rolls 2x10 Shoulder ext 5# 2x10  12/16/21 UBE L2 x6mins  73mted rows 15# 2x10 Lat pull downs 25# 2x10 Shoulder ext 5# 2x10 Cable rows 5# x10, 10# x10 2# IYT 2x10 Pec stretch 30s  Cerv mech traction with MH 12# 10 min  12/14/21 UBE L2x 6mins Dr6meedling bilat UT, lateral pterygoid, and massester  MH to UT and cold pack to occiput 10mins   50m/23 UBE 2 min fwd/2 min back L 2 Cerv retratcion head on ball 10 x 3 sec hold, then hold with 2# UE ex for stab Yellow tband 3 pt stab on way 10 each 2# shruggs,shld ext with scap squeeze and backward rolls 10 each standing Red tband row and shld ext 2 sets 10 Supine prom and end range stretching, suboccipital STW Cerv mech traction with MH 12 # 10 min     12/08/21   UBE L1 x6mins    S32mtching UT, scalenes, cervical rotation and side bending    STM to bilat UT, scalenes, rhomboids   RedTB rows/ext 2x10   Shoulder flexion 1# WaTE 2x10   MH to upper back and  upper traps 10mins   PA37mT EDUCATION:  Education details: POC Person educated: Patient Education method: Explanation Education comprehension: verbalized understanding   HOME EXERCISE PROGRAM: Access Code: [email or web address] URL: [email or web address] Date: 12/01/2021 Prepared by: Hiroyuki Ozanich Andris Baumann- Seated Upper Trapezius Stretch  - 1 x daily - 7 x weekly - 2 sets - 30 hold - Seated Levator Scapulae Stretch  - 1 x daily - 7 x weekly - 2 sets - 30 hold  ASSESSMENT:  CLINICAL IMPRESSION: Continued working on neck and UE strengthening. Still presents with lots of stiffness. DN again today to upper traps, suboccipitals, and TMJ musculature.    REHAB POTENTIAL: Good  CLINICAL DECISION MAKING: Stable/uncomplicated  EVALUATION COMPLEXITY: Low   GOALS: Goals reviewed with patient? Yes  SHORT TERM GOALS: Target date: 12/29/21  Patient will be independent with initial HEP.  Goal status: MET  LONG TERM GOALS: Target date: 01/26/22  Patient will be independent with advanced/ongoing HEP to improve outcomes and carryover.  Goal status: IN PROGRESS  2.  Patient will report 75% improvement in neck pain to improve QOL.   Baseline: 20% better 01/05/22 Goal status: IN  PROGRESS  3.  Patient will demonstrate full pain free cervical ROM to complete ADLs and play with grandchildren with more ease. Goal status: IN PROGRESS  4.  Patient will demonstrate improved posture to decrease muscle imbalance and tightness.  Baseline: increased tightness along cervical paraspinals Goal status: IN PROGRESS  5.  Patient will report 18 on FOTO to demonstrate improved functional ability.  Baseline: 53. 64-/01/05/22 Goal status: MET   PLAN: PT FREQUENCY: 2x/week  PT DURATION: 8 weeks  PLANNED INTERVENTIONS: Therapeutic exercises, Therapeutic activity, Neuromuscular re-education, Balance training, Gait training, Patient/Family education, Self Care, Joint mobilization, Dry Needling,  Spinal manipulation, Spinal mobilization, Cryotherapy, Moist heat, Taping, Vasopneumatic device, Traction, Ionotophoresis 24m/ml Dexamethasone, and Manual therapy  PLAN FOR NEXT SESSION: assess DN, try again to other areas as necessary, stretching and strengthening of upper back and cervical spine   MAndris Baumann PT 01/13/2022, 10:15 AM     [place] [address]Phone: [phone]  Fax:  [phone] Patient Details  Name: KKYLIEANN EAGLESMRN: [id number] of Birth: 8February 10, 1957Referring Provider:  MMarrian Salvage*  Encounter Date: 01/13/2022   MAndris Baumann PT 01/13/2022, 10:15 AM  [place] [address]Phone: [phone]  Fax:  [phone]

## 2022-01-13 ENCOUNTER — Ambulatory Visit: Payer: Medicare Other

## 2022-01-13 DIAGNOSIS — G8929 Other chronic pain: Secondary | ICD-10-CM

## 2022-01-13 DIAGNOSIS — M6281 Muscle weakness (generalized): Secondary | ICD-10-CM

## 2022-01-13 DIAGNOSIS — M542 Cervicalgia: Secondary | ICD-10-CM | POA: Diagnosis not present

## 2022-01-13 DIAGNOSIS — R519 Headache, unspecified: Secondary | ICD-10-CM | POA: Diagnosis not present

## 2022-01-17 ENCOUNTER — Ambulatory Visit: Payer: Medicare Other

## 2022-01-17 DIAGNOSIS — G8929 Other chronic pain: Secondary | ICD-10-CM

## 2022-01-17 DIAGNOSIS — M542 Cervicalgia: Secondary | ICD-10-CM | POA: Diagnosis not present

## 2022-01-17 DIAGNOSIS — M6281 Muscle weakness (generalized): Secondary | ICD-10-CM | POA: Diagnosis not present

## 2022-01-17 DIAGNOSIS — R519 Headache, unspecified: Secondary | ICD-10-CM | POA: Diagnosis not present

## 2022-01-17 NOTE — Therapy (Signed)
OUTPATIENT PHYSICAL THERAPY CERVICAL TREATMENT   Patient Name: Veronica Galvan MRN: [id number] DOB:07/14/1955, 66 y.o., female Today's Date: 01/17/2022    Past Medical History:  Diagnosis Date   Allergy    Anxiety    Arthritis    hands   Cataract    Diabetes mellitus without complication ([place])    type 2   Family history of adverse reaction to anesthesia    son has malignant hyperthermia, daughter does not daughter recently had c section without problems   GERD (gastroesophageal reflux disease)    Headache    sinus   Hyperlipidemia    Hypertension    PONV (postoperative nausea and vomiting)    nausea only   Ulcer, stomach peptic yrs ago   Past Surgical History:  Procedure Laterality Date   APPENDECTOMY     both hells bone spur repair     both heels with metal clips   both shoulder rotator cuff repair     CESAREAN SECTION     x 1   COLONOSCOPY WITH PROPOFOL N/A 09/07/2016   Procedure: COLONOSCOPY WITH PROPOFOL;  Surgeon: Garlan Fair, MD;  Location: [facility];  Service: Endoscopy;  Laterality: N/A;   colonscopy  06/2011   polyps   EYE SURGERY     FRACTURE SURGERY     TUBAL LIGATION     VESICO-VAGINAL FISTULA REPAIR     Patient Active Problem List   Diagnosis Date Noted   Gastroesophageal reflux disease 11/16/2021   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 08/18/2021   Dermatofibroma 08/18/2021   History of malignant neoplasm of skin 08/18/2021   Lentigo 08/18/2021   Melanocytic nevi of trunk 08/18/2021   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   Rosacea 08/18/2021   Actinic keratosis 08/18/2021   Sensorineural hearing loss (SNHL) of left ear with unrestricted hearing of right ear 07/26/2021   Tinnitus of left ear 07/26/2021   Acute recurrent maxillary sinusitis 06/22/2021   Primary hypertension 01/12/2021   Hyperlipidemia associated with type 2 diabetes mellitus ([place]) 01/12/2021   Arthritis 01/12/2021   Type II  diabetes mellitus ([place]) 11/25/2019   Ingrown toenail 09/05/2018   Neck pain 01/29/2018   Tick bite 10/24/2017    PCP: Jodi Mourning  REFERRING PROVIDER: Genia Harold  REFERRING DIAG: M54.2  THERAPY DIAG:  Cervicalgia  Muscle weakness (generalized)  Chronic intractable headache, unspecified headache type  Rationale for Evaluation and Treatment Rehabilitation  ONSET DATE: 11/08/21  SUBJECTIVE:  SUBJECTIVE STATEMENT: Walked, did water aerobics, a 1 hour class at the [facility] for balance and now I am here. I should be limber but I'm not.   PERTINENT HISTORY:  Arthritis, HTN, bilateral rotator cuff surgery 15-33yr ago, DM  PAIN:  Are you having pain? Yes: NPRS scale: 8/10 Pain location: head and neck Pain description: dull, constant Aggravating factors: moving Relieving factors: massage, standing in the shower  WEIGHT BEARING RESTRICTIONS No  FALLS:  Has patient fallen in last 6 months? No  LIVING ENVIRONMENT: Lives with: lives alone Lives in: House/apartment Stairs: Yes: External: 3 steps; can reach both Has following equipment at home: None  OCCUPATION: reitred  PLOF: Independent  PATIENT GOALS to loosen it up, find out what is causing my headaches   OBJECTIVE:   DIAGNOSTIC FINDINGS:   FINDINGS: Brain: No acute infarct, mass effect or extra-axial collection. No acute or chronic hemorrhage. Normal white matter signal, parenchymal volume and CSF spaces. The midline structures are normal.  Skull and upper cervical spine: Normal calvarium and skull base. Visualized upper cervical spine and soft tissues are normal.   Sinuses/Orbits:No paranasal sinus fluid levels or advanced mucosal thickening. No mastoid or middle ear effusion. Normal orbits.    IMPRESSION: Normal brain MRI.  PATIENT SURVEYS:  FOTO 53/100   COGNITION: Overall cognitive status: Within functional limits for tasks assessed  SENSATION: WFL  POSTURE: rounded shoulders, forward head, and increased thoracic kyphosis  PALPATION: TTP and sore along upper trap and cervical paraspinals, trigger points in R and L UT   CERVICAL ROM:   Active ROM A/PROM (deg) eval 12/30/21 01/05/22  Flexion 100% no pain WFL WFL  [facility]  Right lateral flexion Mod limitation w/pain Mild limitations Mild limitations  Left lateral flexion Mod limitation w/pain Mod limitations Mild limitations  Right rotation 75% w/pain Mild limitation w/pain WFL pain at end range   Left rotation 75% w/pain  Mild limitation w/pain  WFL pain at end range    (Blank rows = not tested)  UPPER EXTREMITY ROM: WFL, limited in IR and ER due to prior RC surgery years ago   UPPER EXTREMITY MMT:  MMT Right eval Left eval Right 01/05/22 Left 01/05/22  Shoulder flexion 4+ 4+ 5 5  Shoulder extension      Shoulder abduction 4+ very painful 4+ 4+ with pain 5  Shoulder adduction      Shoulder extension      Shoulder internal rotation 4+ 4+ 5 5  Shoulder external rotation      Middle trapezius      Lower trapezius      Elbow flexion 5 5    Elbow extension 5 5    Wrist flexion      Wrist extension      Wrist ulnar deviation      Wrist radial deviation      Wrist pronation      Wrist supination      Grip strength       (Blank rows = not tested)   TODAY'S TREATMENT:  01/17/22 UBE L2 x651ms  Shoulder ext 10# 2x10 Cable rows 10# 2x10  Horizontal abd redTB 2x10 Bicep curls 25# 2x10 Tricep ext 35# 2x10 MH and traction to c-spine 1068m 15#  01/13/22 UBE L4 x6mi16m Pec stretch with cables and in doorway 30s  Rows 25# 2x10 Lats 25# 2x10 Body blade x10 into flexion  Scapular lift offs w/red ball x10  Massage with gun to  UT, rhomboids   01/11/22 UBE L3  x29mns redTB horizontal 2x10  Shoulder ext 15# 2x10 Cable rows 10# 2x10 Lateral raises 3# 2x10 Shoulder flexion 3# 2x10  Shoulder diagonals 5# 2x10  DN to UT, subocciptials, TMJ  01/06/22 UBE L2 x690ms  STM, Passive stretching  Rows and lats 20# 2x10  RedTB horizontal ABD 2x10 ABD yellow band shoulder flexion 2x10 3 way shoulder yellow band 2x10   01/05/22 Progress note- recheck MMT, cervical ROM, FOTO  UBE L3 x6m5m  DN to bilateral UT, TMJ, and suboccipitals    12/30/21 UBE L2 x6mi35mw/power bursts  Recheck cervical ROM STM and stretching of cervical paraspinals  Shoulder flexion 6# WaTE 2x10  Chest fly w/ 3# 2x10  OHP yellow ball 2x10 YellowTB diagonals  Cold pack to R low back   12/28/21 Nustep L2 x5min76mUBE L2 x5mins80m backs against wall x10 Red ball roll up on wall x10 DN subocciptals, masseter, upper traps  Cold pack 10 mins to R flank and low back   12/23/21 UBE L2 x6mins 47mM and UT stretch  Shoulder ext 5# x10, 10# x10 Cable rows 10# 2x10 ER/IR 5# 2x10   Suboccipital release  Pec stretch 30s   12/21/21 UBE L2 x6mins  61mted rows 15# 2x10 Lat pull downs 25# 2x10 DN to rhomboids, UT, and TMJ  Shoulder ext 3# WaTE 2x10 3# shruggs 2x10 3# backward rolls 2x10 Shoulder ext 5# 2x10  12/16/21 UBE L2 x6mins  S59med rows 15# 2x10 Lat pull downs 25# 2x10 Shoulder ext 5# 2x10 Cable rows 5# x10, 10# x10 2# IYT 2x10 Pec stretch 30s  Cerv mech traction with MH 12# 10 min  12/14/21 UBE L2x 6mins Dry23medling bilat UT, lateral pterygoid, and massester  MH to UT and cold pack to occiput 10mins   860m23 UBE 2 min fwd/2 min back L 2 Cerv retratcion head on ball 10 x 3 sec hold, then hold with 2# UE ex for stab Yellow tband 3 pt stab on way 10 each 2# shruggs,shld ext with scap squeeze and backward rolls 10 each standing Red tband row and shld ext 2 sets 10 Supine prom and end range stretching, suboccipital STW Cerv mech traction with MH 12 # 10  min     12/08/21   UBE L1 x6mins    St79mching UT, scalenes, cervical rotation and side bending    STM to bilat UT, scalenes, rhomboids   RedTB rows/ext 2x10   Shoulder flexion 1# WaTE 2x10   MH to upper back and upper traps 10mins   PAT63m EDUCATION:  Education details: POC Person educated: Patient Education method: Explanation Education comprehension: verbalized understanding   HOME EXERCISE PROGRAM: Access Code: [email or web address] URL: [email or web address] Date: 12/01/2021 Prepared by: Jaslen Adcox  Andris Baumann Seated Upper Trapezius Stretch  - 1 x daily - 7 x weekly - 2 sets - 30 hold - Seated Levator Scapulae Stretch  - 1 x daily - 7 x weekly - 2 sets - 30 hold  ASSESSMENT:  CLINICAL IMPRESSION: Patient had a busy morning today at multiple exercise classes. She still presents with increased tightness and stiffness in upper traps and cervical paraspinals. Continued working on neck and UE strengthening and stretching. States it feels loose after traction.    REHAB POTENTIAL: Good  CLINICAL DECISION MAKING: Stable/uncomplicated  EVALUATION COMPLEXITY: Low   GOALS: Goals reviewed with patient? Yes  SHORT TERM GOALS: Target date: 12/29/21  Patient will be independent with initial HEP.  Goal status: MET  LONG TERM GOALS: Target date: 01/26/22  Patient will be independent with advanced/ongoing HEP to improve outcomes and carryover.  Goal status: IN PROGRESS  2.  Patient will report 75% improvement in neck pain to improve QOL.   Baseline: 20% better 01/05/22 Goal status: IN PROGRESS  3.  Patient will demonstrate full pain free cervical ROM to complete ADLs and play with grandchildren with more ease. Goal status: IN PROGRESS  4.  Patient will demonstrate improved posture to decrease muscle imbalance and tightness.  Baseline: increased tightness along cervical paraspinals Goal status: IN PROGRESS  5.  Patient will report 28 on FOTO to demonstrate improved  functional ability.  Baseline: 53. 64-/01/05/22 Goal status: MET   PLAN: PT FREQUENCY: 2x/week  PT DURATION: 8 weeks  PLANNED INTERVENTIONS: Therapeutic exercises, Therapeutic activity, Neuromuscular re-education, Balance training, Gait training, Patient/Family education, Self Care, Joint mobilization, Dry Needling, Spinal manipulation, Spinal mobilization, Cryotherapy, Moist heat, Taping, Vasopneumatic device, Traction, Ionotophoresis 60m/ml Dexamethasone, and Manual therapy  PLAN FOR NEXT SESSION: assess DN, try again to other areas as necessary, stretching and strengthening of upper back and cervical spine   MAndris Baumann PT 01/17/2022, 2:01 PM     [place] [address]Phone: [phone]  Fax:  [phone] Patient Details  Name: KKEIRY KOWALMRN: [id number] of Birth: 8April 26, 1957Referring Provider:  MMarrian Salvage*  Encounter Date: 01/17/2022   MAndris Baumann PT 01/17/2022, 2:01 PM  [place] [address]Phone: [phone]  Fax:  [phone]

## 2022-01-19 ENCOUNTER — Encounter: Payer: Self-pay | Admitting: Physical Therapy

## 2022-01-19 ENCOUNTER — Ambulatory Visit: Payer: Medicare Other | Admitting: Physical Therapy

## 2022-01-19 DIAGNOSIS — M542 Cervicalgia: Secondary | ICD-10-CM | POA: Diagnosis not present

## 2022-01-19 DIAGNOSIS — R519 Headache, unspecified: Secondary | ICD-10-CM | POA: Diagnosis not present

## 2022-01-19 DIAGNOSIS — M6281 Muscle weakness (generalized): Secondary | ICD-10-CM | POA: Diagnosis not present

## 2022-01-19 DIAGNOSIS — G8929 Other chronic pain: Secondary | ICD-10-CM | POA: Diagnosis not present

## 2022-01-19 NOTE — Therapy (Signed)
OUTPATIENT PHYSICAL THERAPY CERVICAL TREATMENT   Patient Name: Veronica Galvan MRN: [id number] DOB:[birth year], 66 y.o., female Today's Date: 01/19/2022    Past Medical History:  Diagnosis Date   Allergy    Anxiety    Arthritis    hands   Cataract    Diabetes mellitus without complication ([place])    type 2   Family history of adverse reaction to anesthesia    son has malignant hyperthermia, daughter does not daughter recently had c section without problems   GERD (gastroesophageal reflux disease)    Headache    sinus   Hyperlipidemia    Hypertension    PONV (postoperative nausea and vomiting)    nausea only   Ulcer, stomach peptic yrs ago   Past Surgical History:  Procedure Laterality Date   APPENDECTOMY     both hells bone spur repair     both heels with metal clips   both shoulder rotator cuff repair     CESAREAN SECTION     x 1   COLONOSCOPY WITH PROPOFOL N/A 09/07/2016   Procedure: COLONOSCOPY WITH PROPOFOL;  Surgeon: Garlan Fair, MD;  Location: [facility];  Service: Endoscopy;  Laterality: N/A;   colonscopy  06/2011   polyps   EYE SURGERY     FRACTURE SURGERY     TUBAL LIGATION     VESICO-VAGINAL FISTULA REPAIR     Patient Active Problem List   Diagnosis Date Noted   Gastroesophageal reflux disease 11/16/2021   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 08/18/2021   Dermatofibroma 08/18/2021   History of malignant neoplasm of skin 08/18/2021   Lentigo 08/18/2021   Melanocytic nevi of trunk 08/18/2021   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   Rosacea 08/18/2021   Actinic keratosis 08/18/2021   Sensorineural hearing loss (SNHL) of left ear with unrestricted hearing of right ear 07/26/2021   Tinnitus of left ear 07/26/2021   Acute recurrent maxillary sinusitis 06/22/2021   Primary hypertension 01/12/2021   Hyperlipidemia associated with type 2 diabetes mellitus ([place]) 01/12/2021   Arthritis 01/12/2021   Type II  diabetes mellitus ([place]) 11/25/2019   Ingrown toenail 09/05/2018   Neck pain 01/29/2018   Tick bite 10/24/2017    PCP: Jodi Mourning  REFERRING PROVIDER: Genia Harold  REFERRING DIAG: M54.2  THERAPY DIAG:  Cervicalgia  Muscle weakness (generalized)  Chronic intractable headache, unspecified headache type  Rationale for Evaluation and Treatment Rehabilitation  ONSET DATE: 11/08/21  SUBJECTIVE:  SUBJECTIVE STATEMENT: Walked, did water aerobics, a 1 hour class at the [facility]. Upper traps are sore, R>L  PERTINENT HISTORY:  Arthritis, HTN, bilateral rotator cuff surgery 15-94yr ago, DM  PAIN:  Are you having pain? Yes: NPRS scale: 8/10 Pain location: head and neck Pain description: dull, constant Aggravating factors: moving Relieving factors: massage, standing in the shower  WEIGHT BEARING RESTRICTIONS No  FALLS:  Has patient fallen in last 6 months? No  LIVING ENVIRONMENT: Lives with: lives alone Lives in: House/apartment Stairs: Yes: External: 3 steps; can reach both Has following equipment at home: None  OCCUPATION: reitred  PLOF: Independent  PATIENT GOALS to loosen it up, find out what is causing my headaches   OBJECTIVE:   DIAGNOSTIC FINDINGS:   FINDINGS: Brain: No acute infarct, mass effect or extra-axial collection. No acute or chronic hemorrhage. Normal white matter signal, parenchymal volume and CSF spaces. The midline structures are normal.  Skull and upper cervical spine: Normal calvarium and skull base. Visualized upper cervical spine and soft tissues are normal.   Sinuses/Orbits:No paranasal sinus fluid levels or advanced mucosal thickening. No mastoid or middle ear effusion. Normal orbits.   IMPRESSION: Normal brain MRI.  PATIENT SURVEYS:   FOTO 53/100   COGNITION: Overall cognitive status: Within functional limits for tasks assessed  SENSATION: WFL  POSTURE: rounded shoulders, forward head, and increased thoracic kyphosis  PALPATION: TTP and sore along upper trap and cervical paraspinals, trigger points in R and L UT   CERVICAL ROM:   Active ROM A/PROM (deg) eval 12/30/21 01/05/22  Flexion 100% no pain WFL WFL  Extension Mild limitations w/pain [facility]  Right lateral flexion Mod limitation w/pain Mild limitations Mild limitations  Left lateral flexion Mod limitation w/pain Mod limitations Mild limitations  Right rotation 75% w/pain Mild limitation w/pain WFL pain at end range   Left rotation 75% w/pain  Mild limitation w/pain  WFL pain at end range    (Blank rows = not tested)  UPPER EXTREMITY ROM: WFL, limited in IR and ER due to prior RC surgery years ago   UPPER EXTREMITY MMT:  MMT Right eval Left eval Right 01/05/22 Left 01/05/22  Shoulder flexion 4+ 4+ 5 5  Shoulder extension      Shoulder abduction 4+ very painful 4+ 4+ with pain 5  Shoulder adduction      Shoulder extension      Shoulder internal rotation 4+ 4+ 5 5  Shoulder external rotation      Middle trapezius      Lower trapezius      Elbow flexion 5 5    Elbow extension 5 5    Wrist flexion      Wrist extension      Wrist ulnar deviation      Wrist radial deviation      Wrist pronation      Wrist supination      Grip strength       (Blank rows = not tested)   TODAY'S TREATMENT:  01/19/22 UBE L2- 3 min forward/3 min back Shoulder ext, rows, 10#, 2 x 10 reps Shoulder ER against G tband 2 x 10 reps Shoulder stretches, upper traps, doorway stretch at 60/90/120 STM to upper traps, cervical, cerv traction Lower trunk mobilization- seated, sitting on physiodisc, seated with rotation to each side with weight shifts over BOS, Mod TC and VC to activate lower trunk  01/17/22 UBE L2 x638ms  Shoulder ext 10# 2x10 Cable rows 10# 2x10  Horizontal abd redTB 2x10 Bicep curls 25# 2x10 Tricep ext 35# 2x10 MH and traction to c-spine 77mns 15#  01/13/22 UBE L4 x626ms  Pec stretch with cables and in doorway 30s  Rows 25# 2x10 Lats 25# 2x10 Body blade x10 into flexion  Scapular lift offs w/red ball x10  Massage with gun to UT, rhomboids   01/11/22 UBE L3 x6m27m redTB horizontal 2x10  Shoulder ext 15# 2x10 Cable rows 10# 2x10 Lateral raises 3# 2x10 Shoulder flexion 3# 2x10  Shoulder diagonals 5# 2x10  DN to UT, subocciptials, TMJ  01/06/22 UBE L2 x6mi60m STM, Passive stretching  Rows and lats 20# 2x10  RedTB horizontal ABD 2x10 ABD yellow band shoulder flexion 2x10 3 way shoulder yellow band 2x10   01/05/22 Progress note- recheck MMT, cervical ROM, FOTO  UBE L3 x6min22mDN to bilateral UT, TMJ, and suboccipitals    12/30/21 UBE L2 x6mins6mpower bursts  Recheck cervical ROM STM and stretching of cervical paraspinals  Shoulder flexion 6# WaTE 2x10  Chest fly w/ 3# 2x10  OHP yellow ball 2x10 YellowTB diagonals  Cold pack to R low back   12/28/21 Nustep L2 x5mins 75mE L2 x5mins  21macks against wall x10 Red ball roll up on wall x10 DN subocciptals, masseter, upper traps  Cold pack 10 mins to R flank and low back   12/23/21 UBE L2 x6mins  S45mand UT stretch  Shoulder ext 5# x10, 10# x10 Cable rows 10# 2x10 ER/IR 5# 2x10   Suboccipital release  Pec stretch 30s   12/21/21 UBE L2 x6mins  Se35md rows 15# 2x10 Lat pull downs 25# 2x10 DN to rhomboids, UT, and TMJ  Shoulder ext 3# WaTE 2x10 3# shruggs 2x10 3# backward rolls 2x10 Shoulder ext 5# 2x10  12/16/21 UBE L2 x6mins  Sea43m rows 15# 2x10 Lat pull downs 25# 2x10 Shoulder ext 5# 2x10 Cable rows 5# x10, 10# x10 2# IYT 2x10 Pec stretch 30s  Cerv mech traction with MH 12# 10 min  12/14/21 UBE L2x 6mins Dry n108mling bilat UT, lateral pterygoid, and massester  MH to UT and cold pack to occiput 10mins   8/119m UBE 2 min fwd/2 min back L  2 Cerv retratcion head on ball 10 x 3 sec hold, then hold with 2# UE ex for stab Yellow tband 3 pt stab on way 10 each 2# shruggs,shld ext with scap squeeze and backward rolls 10 each standing Red tband row and shld ext 2 sets 10 Supine prom and end range stretching, suboccipital STW Cerv mech traction with MH 12 # 10 min     12/08/21   UBE L1 x6mins    Stre67ming UT, scalenes, cervical rotation and side bending    STM to bilat UT, scalenes, rhomboids   RedTB rows/ext 2x10   Shoulder flexion 1# WaTE 2x10   MH to upper back and upper traps 10mins   PATIE66mDUCATION:  Education details: POC Person educated: Patient Education method: Explanation Education comprehension: verbalized understanding   HOME EXERCISE PROGRAM: Access Code: [email or web address] URL: [email or web address] Date: 12/01/2021 Prepared by: Mona Sajjad   AAndris Baumann CLINICAL IMPRESSION: Patient continues to perform multiple activities. Updated Hep to include additional stretching, need to add trunk stability exercises as well. Patient has great difficulty mobilizing lower trunk, tends to stand in ant tilt which contributes to back pain. Spent time trying to help her move more from lower trunk with improvement noted.   REHAB POTENTIAL: Good  CLINICAL DECISION MAKING: Stable/uncomplicated  EVALUATION COMPLEXITY: Low   GOALS: Goals reviewed with patient? Yes  SHORT TERM GOALS: Target date: 12/29/21  Patient will be independent with initial HEP.  Goal status: MET  LONG TERM GOALS: Target date: 01/26/22  Patient will be independent with advanced/ongoing HEP to improve outcomes and carryover.  Goal status: IN PROGRESS  2.  Patient will report 75% improvement in neck pain to improve QOL.   Baseline: 20% better 01/05/22 Goal status: IN PROGRESS  3.  Patient will demonstrate full pain free cervical ROM to complete ADLs and play with grandchildren with more ease. Goal status: IN PROGRESS  4.   Patient will demonstrate improved posture to decrease muscle imbalance and tightness.  Baseline: increased tightness along cervical paraspinals Goal status: IN PROGRESS  5.  Patient will report 72 on FOTO to demonstrate improved functional ability.  Baseline: 53. 64-/01/05/22 Goal status: MET   PLAN: PT FREQUENCY: 2x/week  PT DURATION: 8 weeks  PLANNED INTERVENTIONS: Therapeutic exercises, Therapeutic activity, Neuromuscular re-education, Balance training, Gait training, Patient/Family education, Self Care, Joint mobilization, Dry Needling, Spinal manipulation, Spinal mobilization, Cryotherapy, Moist heat, Taping, Vasopneumatic device, Traction, Ionotophoresis 31m/ml Dexamethasone, and Manual therapy  PLAN FOR NEXT SESSION: assess DN, try again to other areas as necessary, Lower body mobilization, add trunk stabilization to her HEP.   SMarcelina Morel DPT 01/19/2022, 10:37 AM   [place] [id number]: [phone]  Fax:  [phone]

## 2022-01-26 ENCOUNTER — Encounter: Payer: Self-pay | Admitting: Physical Therapy

## 2022-01-26 ENCOUNTER — Ambulatory Visit: Payer: Medicare Other | Admitting: Physical Therapy

## 2022-01-26 DIAGNOSIS — M6281 Muscle weakness (generalized): Secondary | ICD-10-CM

## 2022-01-26 DIAGNOSIS — M542 Cervicalgia: Secondary | ICD-10-CM | POA: Diagnosis not present

## 2022-01-26 DIAGNOSIS — R519 Headache, unspecified: Secondary | ICD-10-CM | POA: Diagnosis not present

## 2022-01-26 DIAGNOSIS — G8929 Other chronic pain: Secondary | ICD-10-CM

## 2022-01-26 NOTE — Therapy (Signed)
OUTPATIENT PHYSICAL THERAPY CERVICAL TREATMENT   Patient Name: Veronica Galvan MRN: [id number] DOB:1955/10/29, 66 y.o., female Today's Date: 01/26/2022    Past Medical History:  Diagnosis Date   Allergy    Anxiety    Arthritis    hands   Cataract    Diabetes mellitus without complication ([place])    type 2   Family history of adverse reaction to anesthesia    son has malignant hyperthermia, daughter does not daughter recently had c section without problems   GERD (gastroesophageal reflux disease)    Headache    sinus   Hyperlipidemia    Hypertension    PONV (postoperative nausea and vomiting)    nausea only   Ulcer, stomach peptic yrs ago   Past Surgical History:  Procedure Laterality Date   APPENDECTOMY     both hells bone spur repair     both heels with metal clips   both shoulder rotator cuff repair     CESAREAN SECTION     x 1   COLONOSCOPY WITH PROPOFOL N/A 09/07/2016   Procedure: COLONOSCOPY WITH PROPOFOL;  Surgeon: Garlan Fair, MD;  Location: [facility];  Service: Endoscopy;  Laterality: N/A;   colonscopy  06/2011   polyps   EYE SURGERY     FRACTURE SURGERY     TUBAL LIGATION     VESICO-VAGINAL FISTULA REPAIR     Patient Active Problem List   Diagnosis Date Noted   Gastroesophageal reflux disease 11/16/2021   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 08/18/2021   Dermatofibroma 08/18/2021   History of malignant neoplasm of skin 08/18/2021   Lentigo 08/18/2021   Melanocytic nevi of trunk 08/18/2021   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   Rosacea 08/18/2021   Actinic keratosis 08/18/2021   Sensorineural hearing loss (SNHL) of left ear with unrestricted hearing of right ear 07/26/2021   Tinnitus of left ear 07/26/2021   Acute recurrent maxillary sinusitis 06/22/2021   Primary hypertension 01/12/2021   Hyperlipidemia associated with type 2 diabetes mellitus ([place]) 01/12/2021   Arthritis 01/12/2021   Type II  diabetes mellitus ([place]) 11/25/2019   Ingrown toenail 09/05/2018   Neck pain 01/29/2018   Tick bite 10/24/2017    PCP: Jodi Mourning  REFERRING PROVIDER: Genia Harold  REFERRING DIAG: M54.2  THERAPY DIAG:  Cervicalgia  Muscle weakness (generalized)  Chronic intractable headache, unspecified headache type  Rationale for Evaluation and Treatment Rehabilitation  ONSET DATE: 11/08/21  SUBJECTIVE:  SUBJECTIVE STATEMENT: I have been very busy all day, running around,   PERTINENT HISTORY:  Arthritis, HTN, bilateral rotator cuff surgery 15-85yr ago, DM  PAIN:  Are you having pain? Yes: NPRS scale: 8/10 Pain location: head and neck Pain description: dull, constant Aggravating factors: moving Relieving factors: massage, standing in the shower  WEIGHT BEARING RESTRICTIONS No  FALLS:  Has patient fallen in last 6 months? No  LIVING ENVIRONMENT: Lives with: lives alone Lives in: House/apartment Stairs: Yes: External: 3 steps; can reach both Has following equipment at home: None  OCCUPATION: reitred  PLOF: Independent  PATIENT GOALS to loosen it up, find out what is causing my headaches   OBJECTIVE:   DIAGNOSTIC FINDINGS:   FINDINGS: Brain: No acute infarct, mass effect or extra-axial collection. No acute or chronic hemorrhage. Normal white matter signal, parenchymal volume and CSF spaces. The midline structures are normal.  Skull and upper cervical spine: Normal calvarium and skull base. Visualized upper cervical spine and soft tissues are normal.   Sinuses/Orbits:No paranasal sinus fluid levels or advanced mucosal thickening. No mastoid or middle ear effusion. Normal orbits.   IMPRESSION: Normal brain MRI.  PATIENT SURVEYS:  FOTO 53/100   COGNITION: Overall  cognitive status: Within functional limits for tasks assessed  SENSATION: WFL  POSTURE: rounded shoulders, forward head, and increased thoracic kyphosis  PALPATION: TTP and sore along upper trap and cervical paraspinals, trigger points in R and L UT   CERVICAL ROM:   Active ROM A/PROM (deg) eval 12/30/21 01/05/22  Flexion 100% no pain WFL WFL  Extension [facility] limitations [facility]  Right lateral flexion Mod limitation w/pain Mild limitations Mild limitations  Left lateral flexion Mod limitation w/pain Mod limitations Mild limitations  Right rotation 75% w/pain Mild limitation w/pain WFL pain at end range   Left rotation 75% w/pain  Mild limitation w/pain  WFL pain at end range    (Blank rows = not tested)  UPPER EXTREMITY ROM: WFL, limited in IR and ER due to prior RC surgery years ago   UPPER EXTREMITY MMT:  MMT Right eval Left eval Right 01/05/22 Left 01/05/22  Shoulder flexion 4+ 4+ 5 5  Shoulder extension      Shoulder abduction 4+ very painful 4+ 4+ with pain 5  Shoulder adduction      Shoulder extension      Shoulder internal rotation 4+ 4+ 5 5  Shoulder external rotation      Middle trapezius      Lower trapezius      Elbow flexion 5 5    Elbow extension 5 5    Wrist flexion      Wrist extension      Wrist ulnar deviation      Wrist radial deviation      Wrist pronation      Wrist supination      Grip strength       (Blank rows = not tested)   TODAY'S TREATMENT:  01/26/22 UBE level 3.7 x 6 minutes 10# straight arm pulls with cues for posture and core 10# AR press Feet on ball K2C, trunk rotation, small bridges and isometric abs LE stretches UE stretches, scalene stretches Manual cervical occipital release, STM to the TMJ and the neck/upper traps after DN  01/19/22 UBE L2- 3 min forward/3 min back Shoulder ext, rows, 10#, 2 x 10 reps Shoulder ER against G tband 2 x 10 reps Shoulder stretches, upper traps, doorway stretch at 60/90/120 STM to  upper  traps, cervical, cerv traction Lower trunk mobilization- seated, sitting on physiodisc, seated with rotation to each side with weight shifts over BOS, Mod TC and VC to activate lower trunk  01/17/22 UBE L2 x74mns  Shoulder ext 10# 2x10 Cable rows 10# 2x10  Horizontal abd redTB 2x10 Bicep curls 25# 2x10 Tricep ext 35# 2x10 MH and traction to c-spine 14ms 15#  01/13/22 UBE L4 x6m26m  Pec stretch with cables and in doorway 30s56sows 25# 2x10 Lats 25# 2x10 Body blade x10 into flexion  Scapular lift offs w/red ball x10  Massage with gun to UT, rhomboids   01/11/22 UBE L3 x6mi72mredTB horizontal 2x10  Shoulder ext 15# 2x10 Cable rows 10# 2x10 Lateral raises 3# 2x10 Shoulder flexion 3# 2x10  Shoulder diagonals 5# 2x10  DN to UT, subocciptials, TMJ  01/06/22 UBE L2 x6min66mSTM, Passive stretching  Rows and lats 20# 2x10  RedTB horizontal ABD 2x10 ABD yellow band shoulder flexion 2x10 3 way shoulder yellow band 2x10   01/05/22 Progress note- recheck MMT, cervical ROM, FOTO  UBE L3 x6mins39mN to bilateral UT, TMJ, and suboccipitals       PATIENT EDUCATION:  Education details: POC Person educated: Patient Education method: Explanation Education comprehension: verbalized understanding   HOME EXERCISE PROGRAM: Access Code: [email or web address] URL: [email or web address] Date: 12/01/2021 Prepared by: Mona SAndris BaumannESSMENT:  CLINICAL IMPRESSION: Patient conitnues to be very tight in the neck and the head area, she reports less HA intensity and about the same HA frequency, she feels liketh jaw is moving much better and she can bite down now with less pain overall, she is tight in all the mms and has a weak core that we are working on.   REHAB POTENTIAL: Good  CLINICAL DECISION MAKING: Stable/uncomplicated  EVALUATION COMPLEXITY: Low   GOALS: Goals reviewed with patient? Yes  SHORT TERM GOALS: Target date: 12/29/21  Patient will be independent with initial  HEP.  Goal status: MET  LONG TERM GOALS: Target date: 01/26/22  Patient will be independent with advanced/ongoing HEP to improve outcomes and carryover.  Goal status: IN PROGRESS  2.  Patient will report 75% improvement in neck pain to improve QOL.   Baseline: 20% better 01/05/22 Goal status: IN PROGRESS  3.  Patient will demonstrate full pain free cervical ROM to complete ADLs and play with grandchildren with more ease. Goal status: IN PROGRESS  4.  Patient will demonstrate improved posture to decrease muscle imbalance and tightness.  Baseline: increased tightness along cervical paraspinals Goal status: IN PROGRESS  5.  Patient will report 64 on 8TO to demonstrate improved functional ability.  Baseline: 53. 64-/01/05/22 Goal status: MET   PLAN: PT FREQUENCY: 2x/week  PT DURATION: 8 weeks  PLANNED INTERVENTIONS: Therapeutic exercises, Therapeutic activity, Neuromuscular re-education, Balance training, Gait training, Patient/Family education, Self Care, Joint mobilization, Dry Needling, Spinal manipulation, Spinal mobilization, Cryotherapy, Moist heat, Taping, Vasopneumatic device, Traction, Ionotophoresis 4mg/ml36mxamethasone, and Manual therapy  PLAN FOR NEXT SESSION: aLower body mobilization, add trunk stabilization to her HEP.   MichaelLum Babe27/2023, 5:49 PM   [place] [id number] [phone]  [phone]

## 2022-01-27 ENCOUNTER — Ambulatory Visit: Payer: Medicare Other | Admitting: Physical Therapy

## 2022-01-27 ENCOUNTER — Encounter: Payer: Self-pay | Admitting: Physical Therapy

## 2022-01-27 DIAGNOSIS — R519 Headache, unspecified: Secondary | ICD-10-CM | POA: Diagnosis not present

## 2022-01-27 DIAGNOSIS — G8929 Other chronic pain: Secondary | ICD-10-CM | POA: Diagnosis not present

## 2022-01-27 DIAGNOSIS — M542 Cervicalgia: Secondary | ICD-10-CM

## 2022-01-27 DIAGNOSIS — M6281 Muscle weakness (generalized): Secondary | ICD-10-CM

## 2022-01-27 NOTE — Therapy (Signed)
OUTPATIENT PHYSICAL THERAPY CERVICAL TREATMENT   Patient Name: Veronica Galvan MRN: [id number] DOB:16-May-1955, 66 y.o., female Today's Date: 01/27/2022    Past Medical History:  Diagnosis Date   Allergy    Anxiety    Arthritis    hands   Cataract    Diabetes mellitus without complication ([place])    type 2   Family history of adverse reaction to anesthesia    son has malignant hyperthermia, daughter does not daughter recently had c section without problems   GERD (gastroesophageal reflux disease)    Headache    sinus   Hyperlipidemia    Hypertension    PONV (postoperative nausea and vomiting)    nausea only   Ulcer, stomach peptic yrs ago   Past Surgical History:  Procedure Laterality Date   APPENDECTOMY     both hells bone spur repair     both heels with metal clips   both shoulder rotator cuff repair     CESAREAN SECTION     x 1   COLONOSCOPY WITH PROPOFOL N/A 09/07/2016   Procedure: COLONOSCOPY WITH PROPOFOL;  Surgeon: Garlan Fair, MD;  Location: [facility];  Service: Endoscopy;  Laterality: N/A;   colonscopy  06/2011   polyps   EYE SURGERY     FRACTURE SURGERY     TUBAL LIGATION     VESICO-VAGINAL FISTULA REPAIR     Patient Active Problem List   Diagnosis Date Noted   Gastroesophageal reflux disease 11/16/2021   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 08/18/2021   Dermatofibroma 08/18/2021   History of malignant neoplasm of skin 08/18/2021   Lentigo 08/18/2021   Melanocytic nevi of trunk 08/18/2021   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   Rosacea 08/18/2021   Actinic keratosis 08/18/2021   Sensorineural hearing loss (SNHL) of left ear with unrestricted hearing of right ear 07/26/2021   Tinnitus of left ear 07/26/2021   Acute recurrent maxillary sinusitis 06/22/2021   Primary hypertension 01/12/2021   Hyperlipidemia associated with type 2 diabetes mellitus ([place]) 01/12/2021   Arthritis 01/12/2021   Type II  diabetes mellitus ([place]) 11/25/2019   Ingrown toenail 09/05/2018   Neck pain 01/29/2018   Tick bite 10/24/2017    PCP: Jodi Mourning  REFERRING PROVIDER: Genia Harold  REFERRING DIAG: M54.2  THERAPY DIAG:  Cervicalgia - Plan: PT plan of care cert/re-cert  Muscle weakness (generalized) - Plan: PT plan of care cert/re-cert  Chronic intractable headache, unspecified headache type - Plan: PT plan of care cert/re-cert  Rationale for Evaluation and Treatment Rehabilitation  ONSET DATE: 11/08/21  SUBJECTIVE:  SUBJECTIVE STATEMENT:  I'm tired I was just here last night, I'm sore like a workout pain. Nothing is hard for me at home, I can do everything I need and want to do. HAs are not as intense, I still have one every day. I feel like I'm at 70%, HAs and just being tight are the last 30% of that.    PERTINENT HISTORY:  Arthritis, HTN, bilateral rotator cuff surgery 15-69yr ago, DM  PAIN:  Are you having pain? Yes: NPRS scale: 5/10 Pain location: head and neck, shoulders  Pain description: tight  Aggravating factors: moving Relieving factors: massage, standing in the shower  WEIGHT BEARING RESTRICTIONS No  FALLS:  Has patient fallen in last 6 months? No  LIVING ENVIRONMENT: Lives with: lives alone Lives in: House/apartment Stairs: Yes: External: 3 steps; can reach both Has following equipment at home: None  OCCUPATION: reitred  PLOF: Independent  PATIENT GOALS to loosen it up, find out what is causing my headaches   OBJECTIVE:   DIAGNOSTIC FINDINGS:   FINDINGS: Brain: No acute infarct, mass effect or extra-axial collection. No acute or chronic hemorrhage. Normal white matter signal, parenchymal volume and CSF spaces. The midline structures are normal.  Skull and  upper cervical spine: Normal calvarium and skull base. Visualized upper cervical spine and soft tissues are normal.   Sinuses/Orbits:No paranasal sinus fluid levels or advanced mucosal thickening. No mastoid or middle ear effusion. Normal orbits.   IMPRESSION: Normal brain MRI.  PATIENT SURVEYS:  FOTO 53/100 , FOTO 64 (predicted level)   COGNITION: Overall cognitive status: Within functional limits for tasks assessed  SENSATION: WFL  POSTURE: rounded shoulders, forward head, and increased thoracic kyphosis  PALPATION: TTP and sore along upper trap and cervical paraspinals, trigger points in R and L UT   CERVICAL ROM:   Active ROM A/PROM (deg) eval 12/30/21 01/05/22 9/28  Flexion 100% no pain WFL WFL WNL   Extension [facility]   Right lateral flexion Mod limitation w/pain Mild limitations Mild limitations Severe limitation, leans with trunk   Left lateral flexion Mod limitation w/pain Mod limitations Mild limitations Severe limitation, leans with trunk   Right rotation 75% w/pain Mild limitation w/pain WFL pain at end range  Mild limitation   Left rotation 75% w/pain  Mild limitation w/pain  WFL pain at end range  Mild limitation    (Blank rows = not tested)  UPPER EXTREMITY ROM: WFL, limited in IR and ER due to prior RC surgery years ago   UPPER EXTREMITY MMT:  MMT Right eval Left eval Right 01/05/22 Left 01/05/22 R 9/28 L 9/28  Shoulder flexion 4+ 4+ 5 5 5 5   Shoulder extension        Shoulder abduction 4+ very painful 4+ 4+ with pain 5 5 5   Shoulder adduction        Shoulder extension        Shoulder internal rotation 4+ 4+ 5 5 5 5   Shoulder external rotation     5 5  Middle trapezius        Lower trapezius        Elbow flexion 5 5      Elbow extension 5 5      Wrist flexion        Wrist extension        Wrist ulnar deviation        Wrist radial deviation        Wrist pronation  Wrist supination        Grip strength          (Blank rows = not tested)   TODAY'S TREATMENT:   01/27/22  Re-assessment/appropriate education about goals and POC  TherEx   UBE L3.7 x3 minutes forward/3 minutes backward  Chin tucks x15 with 3 second holds Chin tucks with rotation x10 B  Chin tucks with lateral flexion x10 B Thoracic excursions x15 each direction  01/26/22 UBE level 3.7 x 6 minutes 10# straight arm pulls with cues for posture and core 10# AR press Feet on ball K2C, trunk rotation, small bridges and isometric abs LE stretches UE stretches, scalene stretches Manual cervical occipital release, STM to the TMJ and the neck/upper traps after DN  01/19/22 UBE L2- 3 min forward/3 min back Shoulder ext, rows, 10#, 2 x 10 reps Shoulder ER against G tband 2 x 10 reps Shoulder stretches, upper traps, doorway stretch at 60/90/120 STM to upper traps, cervical, cerv traction Lower trunk mobilization- seated, sitting on physiodisc, seated with rotation to each side with weight shifts over BOS, Mod TC and VC to activate lower trunk  01/17/22 UBE L2 x68mns  Shoulder ext 10# 2x10 Cable rows 10# 2x10  Horizontal abd redTB 2x10 Bicep curls 25# 2x10 Tricep ext 35# 2x10 MH and traction to c-spine 19ms 15#  01/13/22 UBE L4 x6m6m  Pec stretch with cables and in doorway 30s48sows 25# 2x10 Lats 25# 2x10 Body blade x10 into flexion  Scapular lift offs w/red ball x10  Massage with gun to UT, rhomboids   01/11/22 UBE L3 x6mi46mredTB horizontal 2x10  Shoulder ext 15# 2x10 Cable rows 10# 2x10 Lateral raises 3# 2x10 Shoulder flexion 3# 2x10  Shoulder diagonals 5# 2x10  DN to UT, subocciptials, TMJ  01/06/22 UBE L2 x6min83mSTM, Passive stretching  Rows and lats 20# 2x10  RedTB horizontal ABD 2x10 ABD yellow band shoulder flexion 2x10 3 way shoulder yellow band 2x10   01/05/22 Progress note- recheck MMT, cervical ROM, FOTO  UBE L3 x6mins44mN to bilateral UT, TMJ, and suboccipitals       PATIENT EDUCATION:   Education details: POC Person educated: Patient Education method: Explanation Education comprehension: verbalized understanding   HOME EXERCISE PROGRAM: Access Code: [email or web address] URL: [email or web address] Date: 12/01/2021 Prepared by: Mona SAndris BaumannESSMENT:  CLINICAL IMPRESSION:  Reassessment completed today- has been making steady progress overall however I am concerned that she may be getting close to reaching max benefit from skilled PT services at this time for this specific problem. Added goal for core strength today too. She would really like to continue with PT especially for mobility work, core strength,  and ongoing dry needling, we will extend into October but I did educate that we will need to DC at the end of that period if objective progress remains limited.    REHAB POTENTIAL: Good  CLINICAL DECISION MAKING: Stable/uncomplicated  EVALUATION COMPLEXITY: Low   GOALS: Goals reviewed with patient? Yes  SHORT TERM GOALS: Target date: 12/29/21  Patient will be independent with initial HEP.  Goal status: MET  LONG TERM GOALS: Target date: 01/26/22  Patient will be independent with advanced/ongoing HEP to improve outcomes and carryover.  Goal status: IN PROGRESS  2.  Patient will report 75% improvement in neck pain to improve QOL.   Baseline: 9/28- not even 50% improvement maybe 40% Goal status: IN PROGRESS  3.  Patient will demonstrate full pain free cervical ROM to  complete ADLs and play with grandchildren with more ease. Goal status: IN PROGRESS 9/28- better but still limited (see above)  4.  Patient will demonstrate improved posture to decrease muscle imbalance and tightness.  Baseline: increased tightness along cervical paraspinals Goal status: IN PROGRESS 9/28- ongoing impaired posture   5.  Patient will report 70 on FOTO to demonstrate improved functional ability.  Baseline:  9/28- 64  Goal status: MET  6.  Will demonstrate improved  core strength as evidenced by passing double leg lower test  Baseline:  Goal status: INITIAL    PLAN: PT FREQUENCY: 2x/week  PT DURATION: 4 weeks  PLANNED INTERVENTIONS: Therapeutic exercises, Therapeutic activity, Neuromuscular re-education, Balance training, Gait training, Patient/Family education, Self Care, Joint mobilization, Dry Needling, Spinal manipulation, Spinal mobilization, Cryotherapy, Moist heat, Taping, Vasopneumatic device, Traction, Ionotophoresis 8m/ml Dexamethasone, and Manual therapy  PLAN FOR NEXT SESSION: aLower body mobilization, add trunk stabilization to her HEP. May be close to maximizing benefit from PT- plan for likely DC at end of October    Lisa Milian U PT DPT PN2  01/27/2022, 2:43 PM    [place] [id number]: [phone]  Fax:  [phone]

## 2022-01-28 ENCOUNTER — Ambulatory Visit: Payer: Medicare Other

## 2022-01-28 ENCOUNTER — Encounter: Payer: Self-pay | Admitting: Family

## 2022-01-31 NOTE — Therapy (Signed)
OUTPATIENT PHYSICAL THERAPY CERVICAL TREATMENT   Patient Name: Veronica Galvan MRN: [id number] DOB:01/05/1956, 66 y.o., female Today's Date: 02/01/2022    Past Medical History:  Diagnosis Date   Allergy    Anxiety    Arthritis    hands   Cataract    Diabetes mellitus without complication ([place])    type 2   Family history of adverse reaction to anesthesia    son has malignant hyperthermia, daughter does not daughter recently had c section without problems   GERD (gastroesophageal reflux disease)    Headache    sinus   Hyperlipidemia    Hypertension    PONV (postoperative nausea and vomiting)    nausea only   Ulcer, stomach peptic yrs ago   Past Surgical History:  Procedure Laterality Date   APPENDECTOMY     both hells bone spur repair     both heels with metal clips   both shoulder rotator cuff repair     CESAREAN SECTION     x 1   COLONOSCOPY WITH PROPOFOL N/A 09/07/2016   Procedure: COLONOSCOPY WITH PROPOFOL;  Surgeon: Garlan Fair, MD;  Location: [facility];  Service: Endoscopy;  Laterality: N/A;   colonscopy  06/2011   polyps   EYE SURGERY     FRACTURE SURGERY     TUBAL LIGATION     VESICO-VAGINAL FISTULA REPAIR     Patient Active Problem List   Diagnosis Date Noted   Gastroesophageal reflux disease 11/16/2021   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 08/18/2021   Dermatofibroma 08/18/2021   History of malignant neoplasm of skin 08/18/2021   Lentigo 08/18/2021   Melanocytic nevi of trunk 08/18/2021   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   Rosacea 08/18/2021   Actinic keratosis 08/18/2021   Sensorineural hearing loss (SNHL) of left ear with unrestricted hearing of right ear 07/26/2021   Tinnitus of left ear 07/26/2021   Acute recurrent maxillary sinusitis 06/22/2021   Primary hypertension 01/12/2021   Hyperlipidemia associated with type 2 diabetes mellitus ([place]) 01/12/2021   Arthritis 01/12/2021   Type II  diabetes mellitus ([place]) 11/25/2019   Ingrown toenail 09/05/2018   Neck pain 01/29/2018   Tick bite 10/24/2017    PCP: Jodi Mourning  REFERRING PROVIDER: Genia Harold  REFERRING DIAG: M54.2  THERAPY DIAG:  Cervicalgia  Muscle weakness (generalized)  Chronic intractable headache, unspecified headache type  Rationale for Evaluation and Treatment Rehabilitation  ONSET DATE: 11/08/21  SUBJECTIVE:  SUBJECTIVE STATEMENT:  I just feel so tight, headaches are still there but have gotten better. If I move the pain is at a 0 but   PERTINENT HISTORY:  Arthritis, HTN, bilateral rotator cuff surgery 15-57yr ago, DM  PAIN:  Are you having pain? Yes: NPRS scale: 6/10 Pain location: head and neck, shoulders  Pain description: tight  Aggravating factors: moving Relieving factors: massage, standing in the shower  WEIGHT BEARING RESTRICTIONS No  FALLS:  Has patient fallen in last 6 months? No  LIVING ENVIRONMENT: Lives with: lives alone Lives in: House/apartment Stairs: Yes: External: 3 steps; can reach both Has following equipment at home: None  OCCUPATION: reitred  PLOF: Independent  PATIENT GOALS to loosen it up, find out what is causing my headaches   OBJECTIVE:   DIAGNOSTIC FINDINGS:   FINDINGS: Brain: No acute infarct, mass effect or extra-axial collection. No acute or chronic hemorrhage. Normal white matter signal, parenchymal volume and CSF spaces. The midline structures are normal.  Skull and upper cervical spine: Normal calvarium and skull base. Visualized upper cervical spine and soft tissues are normal.   Sinuses/Orbits:No paranasal sinus fluid levels or advanced mucosal thickening. No mastoid or middle ear effusion. Normal orbits.   IMPRESSION: Normal brain  MRI.  PATIENT SURVEYS:  FOTO 53/100 , FOTO 64 (predicted level)   COGNITION: Overall cognitive status: Within functional limits for tasks assessed  SENSATION: WFL  POSTURE: rounded shoulders, forward head, and increased thoracic kyphosis  PALPATION: TTP and sore along upper trap and cervical paraspinals, trigger points in R and L UT   CERVICAL ROM:   Active ROM A/PROM (deg) eval 12/30/21 01/05/22 9/28  Flexion 100% no pain WFL WFL WNL   [facility] W[facility]   Right lateral flexion Mod limitation w/pain Mild limitations Mild limitations Severe limitation, leans with trunk   Left lateral flexion Mod limitation w/pain Mod limitations Mild limitations Severe limitation, leans with trunk   Right rotation 75% w/pain Mild limitation w/pain WFL pain at end range  Mild limitation   Left rotation 75% w/pain  Mild limitation w/pain  WFL pain at end range  Mild limitation    (Blank rows = not tested)  UPPER EXTREMITY ROM: WFL, limited in IR and ER due to prior RC surgery years ago   UPPER EXTREMITY MMT:  MMT Right eval Left eval Right 01/05/22 Left 01/05/22 R 9/28 L 9/28  Shoulder flexion 4+ 4+ 5 5 5 5   Shoulder extension        Shoulder abduction 4+ very painful 4+ 4+ with pain 5 5 5   Shoulder adduction        Shoulder extension        Shoulder internal rotation 4+ 4+ 5 5 5 5   Shoulder external rotation     5 5  Middle trapezius        Lower trapezius        Elbow flexion 5 5      Elbow extension 5 5      Wrist flexion        Wrist extension        Wrist ulnar deviation        Wrist radial deviation        Wrist pronation        Wrist supination        Grip strength         (Blank rows = not tested)   TODAY'S TREATMENT:  02/01/22 UBE  L2 x31mns  Thoracic ext over half foam x10 HS stretch 30s  Sk2C 30s  Bridges x10 Trunk rotations x10  Pallof press 10# 2x10 Crunches blackTB 2x10 Blacktb ext 2x10    01/27/22  Re-assessment/appropriate  education about goals and POC  TherEx   UBE L3.7 x3 minutes forward/3 minutes backward  Chin tucks x15 with 3 second holds Chin tucks with rotation x10 B  Chin tucks with lateral flexion x10 B Thoracic excursions x15 each direction  01/26/22 UBE level 3.7 x 6 minutes 10# straight arm pulls with cues for posture and core 10# AR press Feet on ball K2C, trunk rotation, small bridges and isometric abs LE stretches UE stretches, scalene stretches Manual cervical occipital release, STM to the TMJ and the neck/upper traps after DN  01/19/22 UBE L2- 3 min forward/3 min back Shoulder ext, rows, 10#, 2 x 10 reps Shoulder ER against G tband 2 x 10 reps Shoulder stretches, upper traps, doorway stretch at 60/90/120 STM to upper traps, cervical, cerv traction Lower trunk mobilization- seated, sitting on physiodisc, seated with rotation to each side with weight shifts over BOS, Mod TC and VC to activate lower trunk  01/17/22 UBE L2 x630ms  Shoulder ext 10# 2x10 Cable rows 10# 2x10  Horizontal abd redTB 2x10 Bicep curls 25# 2x10 Tricep ext 35# 2x10 MH and traction to c-spine 1035m 15#  01/13/22 UBE L4 x6mi39m Pec stretch with cables and in doorway 30s 31sws 25# 2x10 Lats 25# 2x10 Body blade x10 into flexion  Scapular lift offs w/red ball x10  Massage with gun to UT, rhomboids   01/11/22 UBE L3 x6min90medTB horizontal 2x10  Shoulder ext 15# 2x10 Cable rows 10# 2x10 Lateral raises 3# 2x10 Shoulder flexion 3# 2x10  Shoulder diagonals 5# 2x10  DN to UT, subocciptials, TMJ  01/06/22 UBE L2 x6mins67mTM, Passive stretching  Rows and lats 20# 2x10  RedTB horizontal ABD 2x10 ABD yellow band shoulder flexion 2x10 3 way shoulder yellow band 2x10   01/05/22 Progress note- recheck MMT, cervical ROM, FOTO  UBE L3 x6mins 85m to bilateral UT, TMJ, and suboccipitals       PATIENT EDUCATION:  Education details: POC Person educated: Patient Education method: Explanation Education  comprehension: verbalized understanding   HOME EXERCISE PROGRAM: Access Code: [email or web address] URL: [email or web address] Date: 12/01/2021 Prepared by: Huber Mathers SaAndris BaumannSSMENT:  CLINICAL IMPRESSION: Patient returns doing better but still has complaints of tightness. We did a lot of stretching and back mobility exercises as well as core strengthening today as she expresses her core is really weak and she is tight everywhere and cannot touch her toes.  REHAB POTENTIAL: Good  CLINICAL DECISION MAKING: Stable/uncomplicated  EVALUATION COMPLEXITY: Low   GOALS: Goals reviewed with patient? Yes  SHORT TERM GOALS: Target date: 12/29/21  Patient will be independent with initial HEP.  Goal status: MET  LONG TERM GOALS: Target date: 02/26/22  Patient will be independent with advanced/ongoing HEP to improve outcomes and carryover.  Goal status: IN PROGRESS  2.  Patient will report 75% improvement in neck pain to improve QOL.   Baseline: 9/28- not even 50% improvement maybe 40% Goal status: IN PROGRESS  3.  Patient will demonstrate full pain free cervical ROM to complete ADLs and play with grandchildren with more ease. Goal status: IN PROGRESS 9/28- better but still limited (see above)  4.  Patient will demonstrate improved posture to decrease muscle imbalance and tightness.  Baseline: increased tightness along cervical  paraspinals Goal status: IN PROGRESS 9/28- ongoing impaired posture   5.  Patient will report 77 on FOTO to demonstrate improved functional ability.  Baseline:  9/28- 64  Goal status: MET  6.  Will demonstrate improved core strength as evidenced by passing double leg lower test  Baseline:  Goal status: INITIAL    PLAN: PT FREQUENCY: 2x/week  PT DURATION: 4 weeks  PLANNED INTERVENTIONS: Therapeutic exercises, Therapeutic activity, Neuromuscular re-education, Balance training, Gait training, Patient/Family education, Self Care, Joint mobilization,  Dry Needling, Spinal manipulation, Spinal mobilization, Cryotherapy, Moist heat, Taping, Vasopneumatic device, Traction, Ionotophoresis 62m/ml Dexamethasone, and Manual therapy  PLAN FOR NEXT SESSION: aLower body mobilization, add trunk stabilization to her HEP. Chest press, Rows and lats    MAndris Baumann DPT  02/01/2022, 1:13 PM   [place] [address]Phone: [phone]  Fax:  [phone]

## 2022-02-01 ENCOUNTER — Ambulatory Visit: Payer: Medicare Other | Attending: Psychiatry

## 2022-02-01 DIAGNOSIS — M6281 Muscle weakness (generalized): Secondary | ICD-10-CM | POA: Insufficient documentation

## 2022-02-01 DIAGNOSIS — R519 Headache, unspecified: Secondary | ICD-10-CM | POA: Insufficient documentation

## 2022-02-01 DIAGNOSIS — G8929 Other chronic pain: Secondary | ICD-10-CM | POA: Insufficient documentation

## 2022-02-01 DIAGNOSIS — M542 Cervicalgia: Secondary | ICD-10-CM | POA: Insufficient documentation

## 2022-02-02 NOTE — Therapy (Signed)
OUTPATIENT PHYSICAL THERAPY CERVICAL TREATMENT   Patient Name: Veronica Galvan MRN: [id number] DOB:11/26/1955, 66 y.o., female Today's Date: 02/03/2022    Past Medical History:  Diagnosis Date   Allergy    Anxiety    Arthritis    hands   Cataract    Diabetes mellitus without complication ([place])    type 2   Family history of adverse reaction to anesthesia    son has malignant hyperthermia, daughter does not daughter recently had c section without problems   GERD (gastroesophageal reflux disease)    Headache    sinus   Hyperlipidemia    Hypertension    PONV (postoperative nausea and vomiting)    nausea only   Ulcer, stomach peptic yrs ago   Past Surgical History:  Procedure Laterality Date   APPENDECTOMY     both hells bone spur repair     both heels with metal clips   both shoulder rotator cuff repair     CESAREAN SECTION     x 1   COLONOSCOPY WITH PROPOFOL N/A 09/07/2016   Procedure: COLONOSCOPY WITH PROPOFOL;  Surgeon: Garlan Fair, MD;  Location: [facility];  Service: Endoscopy;  Laterality: N/A;   colonscopy  06/2011   polyps   EYE SURGERY     FRACTURE SURGERY     TUBAL LIGATION     VESICO-VAGINAL FISTULA REPAIR     Patient Active Problem List   Diagnosis Date Noted   Gastroesophageal reflux disease 11/16/2021   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 08/18/2021   Dermatofibroma 08/18/2021   History of malignant neoplasm of skin 08/18/2021   Lentigo 08/18/2021   Melanocytic nevi of trunk 08/18/2021   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   Rosacea 08/18/2021   Actinic keratosis 08/18/2021   Sensorineural hearing loss (SNHL) of left ear with unrestricted hearing of right ear 07/26/2021   Tinnitus of left ear 07/26/2021   Acute recurrent maxillary sinusitis 06/22/2021   Primary hypertension 01/12/2021   Hyperlipidemia associated with type 2 diabetes mellitus ([place]) 01/12/2021   Arthritis 01/12/2021   Type II  diabetes mellitus ([place]) 11/25/2019   Ingrown toenail 09/05/2018   Neck pain 01/29/2018   Tick bite 10/24/2017    PCP: Jodi Mourning  REFERRING PROVIDER: Genia Harold  REFERRING DIAG: M54.2  THERAPY DIAG:  Cervicalgia  Muscle weakness (generalized)  Chronic intractable headache, unspecified headache type  Rationale for Evaluation and Treatment Rehabilitation  ONSET DATE: 11/08/21  SUBJECTIVE:  SUBJECTIVE STATEMENT:  I feel really stiff this morning, but I got up and walked today. 0 pain if I am sitting still but if I move it's a 6.   PERTINENT HISTORY:  Arthritis, HTN, bilateral rotator cuff surgery 15-70yr ago, DM  PAIN:  Are you having pain? Yes: NPRS scale: 6/10 Pain location: head and neck, shoulders  Pain description: tight  Aggravating factors: moving Relieving factors: massage, standing in the shower  WEIGHT BEARING RESTRICTIONS No  FALLS:  Has patient fallen in last 6 months? No  LIVING ENVIRONMENT: Lives with: lives alone Lives in: House/apartment Stairs: Yes: External: 3 steps; can reach both Has following equipment at home: None  OCCUPATION: reitred  PLOF: Independent  PATIENT GOALS to loosen it up, find out what is causing my headaches   OBJECTIVE:   DIAGNOSTIC FINDINGS:   FINDINGS: Brain: No acute infarct, mass effect or extra-axial collection. No acute or chronic hemorrhage. Normal white matter signal, parenchymal volume and CSF spaces. The midline structures are normal.  Skull and upper cervical spine: Normal calvarium and skull base. Visualized upper cervical spine and soft tissues are normal.   Sinuses/Orbits:No paranasal sinus fluid levels or advanced mucosal thickening. No mastoid or middle ear effusion. Normal orbits.    IMPRESSION: Normal brain MRI.  PATIENT SURVEYS:  FOTO 53/100 , FOTO 64 (predicted level)   COGNITION: Overall cognitive status: Within functional limits for tasks assessed  SENSATION: WFL  POSTURE: rounded shoulders, forward head, and increased thoracic kyphosis  PALPATION: TTP and sore along upper trap and cervical paraspinals, trigger points in R and L UT   CERVICAL ROM:   Active ROM A/PROM (deg) eval 12/30/21 01/05/22 9/28  Flexion 100% no pain WFL WFL WNL   [facility]   Right lateral flexion Mod limitation w/pain Mild limitations Mild limitations Severe limitation, leans with trunk   Left lateral flexion Mod limitation w/pain Mod limitations Mild limitations Severe limitation, leans with trunk   Right rotation 75% w/pain Mild limitation w/pain WFL pain at end range  Mild limitation   Left rotation 75% w/pain  Mild limitation w/pain  WFL pain at end range  Mild limitation    (Blank rows = not tested)  UPPER EXTREMITY ROM: WFL, limited in IR and ER due to prior RC surgery years ago   UPPER EXTREMITY MMT:  MMT Right eval Left eval Right 01/05/22 Left 01/05/22 R 9/28 L 9/28  Shoulder flexion 4+ 4+ 5 5 5 5   Shoulder extension        Shoulder abduction 4+ very painful 4+ 4+ with pain 5 5 5   Shoulder adduction        Shoulder extension        Shoulder internal rotation 4+ 4+ 5 5 5 5   Shoulder external rotation     5 5  Middle trapezius        Lower trapezius        Elbow flexion 5 5      Elbow extension 5 5      Wrist flexion        Wrist extension        Wrist ulnar deviation        Wrist radial deviation        Wrist pronation        Wrist supination        Grip strength         (Blank rows = not tested)   TODAY'S  TREATMENT:  02/03/22 UBE L2 x2mns  NuStep L3 x560ms  Modified crunches on BOSU 2x10 Piriformis stretch seated with assistance 15s each side  Feet on pball trunk rotations, knees to chest, and bridge x10  Pallof  press with rotation 10# 2x10     02/01/22 UBE L2 x6m56m  Thoracic ext over half foam x10 HS stretch 30s  Sk2C 30s  Bridges x10 Trunk rotations x10  Pallof press 10# 2x10 Crunches blackTB 2x10 Blacktb ext 2x10    01/27/22  Re-assessment/appropriate education about goals and POC  TherEx   UBE L3.7 x3 minutes forward/3 minutes backward  Chin tucks x15 with 3 second holds Chin tucks with rotation x10 B  Chin tucks with lateral flexion x10 B Thoracic excursions x15 each direction  01/26/22 UBE level 3.7 x 6 minutes 10# straight arm pulls with cues for posture and core 10# AR press Feet on ball K2C, trunk rotation, small bridges and isometric abs LE stretches UE stretches, scalene stretches Manual cervical occipital release, STM to the TMJ and the neck/upper traps after DN  01/19/22 UBE L2- 3 min forward/3 min back Shoulder ext, rows, 10#, 2 x 10 reps Shoulder ER against G tband 2 x 10 reps Shoulder stretches, upper traps, doorway stretch at 60/90/120 STM to upper traps, cervical, cerv traction Lower trunk mobilization- seated, sitting on physiodisc, seated with rotation to each side with weight shifts over BOS, Mod TC and VC to activate lower trunk  01/17/22 UBE L2 x6mi68m Shoulder ext 10# 2x10 Cable rows 10# 2x10  Horizontal abd redTB 2x10 Bicep curls 25# 2x10 Tricep ext 35# 2x10 MH and traction to c-spine 10mi48m5#  01/13/22 UBE L4 x6mins73mec stretch with cables and in doorway 30s  R56s 25# 2x10 Lats 25# 2x10 Body blade x10 into flexion  Scapular lift offs w/red ball x10  Massage with gun to UT, rhomboids   01/11/22 UBE L3 x6mins 47mTB horizontal 2x10  Shoulder ext 15# 2x10 Cable rows 10# 2x10 Lateral raises 3# 2x10 Shoulder flexion 3# 2x10  Shoulder diagonals 5# 2x10  DN to UT, subocciptials, TMJ  01/06/22 UBE L2 x6mins  36m, Passive stretching  Rows and lats 20# 2x10  RedTB horizontal ABD 2x10 ABD yellow band shoulder flexion 2x10 3 way shoulder  yellow band 2x10   01/05/22 Progress note- recheck MMT, cervical ROM, FOTO  UBE L3 x6mins  D41mo bilateral UT, TMJ, and suboccipitals       PATIENT EDUCATION:  Education details: POC Person educated: Patient Education method: Explanation Education comprehension: verbalized understanding   HOME EXERCISE PROGRAM: Access Code: [email or web address] URL: [email or web address] Date: 12/01/2021 Prepared by: Lacy Sofia SajjAndris BaumannMENT:  CLINICAL IMPRESSION: Patient returns doing better but still has complaints of tightness. We did a lot of stretching and back mobility exercises as well as core strengthening today. Has difficulty with modified crunches due to weakness.   REHAB POTENTIAL: Good  CLINICAL DECISION MAKING: Stable/uncomplicated  EVALUATION COMPLEXITY: Low   GOALS: Goals reviewed with patient? Yes  SHORT TERM GOALS: Target date: 12/29/21  Patient will be independent with initial HEP.  Goal status: MET  LONG TERM GOALS: Target date: 02/26/22  Patient will be independent with advanced/ongoing HEP to improve outcomes and carryover.  Goal status: IN PROGRESS  2.  Patient will report 75% improvement in neck pain to improve QOL.   Baseline: 9/28- not even 50% improvement maybe 40% Goal status: IN PROGRESS  3.  Patient will demonstrate full pain free cervical ROM to  complete ADLs and play with grandchildren with more ease. Goal status: IN PROGRESS 9/28- better but still limited (see above)  4.  Patient will demonstrate improved posture to decrease muscle imbalance and tightness.  Baseline: increased tightness along cervical paraspinals Goal status: IN PROGRESS 9/28- ongoing impaired posture   5.  Patient will report 38 on FOTO to demonstrate improved functional ability.  Baseline:  9/28- 64  Goal status: MET  6.  Will demonstrate improved core strength as evidenced by passing double leg lower test  Baseline:  Goal status: INITIAL    PLAN: PT FREQUENCY:  2x/week  PT DURATION: 4 weeks  PLANNED INTERVENTIONS: Therapeutic exercises, Therapeutic activity, Neuromuscular re-education, Balance training, Gait training, Patient/Family education, Self Care, Joint mobilization, Dry Needling, Spinal manipulation, Spinal mobilization, Cryotherapy, Moist heat, Taping, Vasopneumatic device, Traction, Ionotophoresis 64m/ml Dexamethasone, and Manual therapy  PLAN FOR NEXT SESSION: Lower body mobilization, add trunk stabilization to her HEP. Birddogs and deadbugs, Chest press, Rows and lats    MAndris Baumann DPT  02/03/2022, 10:13 AM   [place] [id number]: [phone]  Fax:  [phone]

## 2022-02-03 ENCOUNTER — Encounter: Payer: Self-pay | Admitting: Family

## 2022-02-03 ENCOUNTER — Ambulatory Visit: Payer: Medicare Other

## 2022-02-03 DIAGNOSIS — G8929 Other chronic pain: Secondary | ICD-10-CM | POA: Diagnosis not present

## 2022-02-03 DIAGNOSIS — M542 Cervicalgia: Secondary | ICD-10-CM

## 2022-02-03 DIAGNOSIS — M6281 Muscle weakness (generalized): Secondary | ICD-10-CM | POA: Diagnosis not present

## 2022-02-03 DIAGNOSIS — R519 Headache, unspecified: Secondary | ICD-10-CM | POA: Diagnosis not present

## 2022-02-08 ENCOUNTER — Ambulatory Visit: Payer: Medicare Other | Admitting: Physical Therapy

## 2022-02-08 ENCOUNTER — Encounter: Payer: Self-pay | Admitting: Physical Therapy

## 2022-02-08 DIAGNOSIS — G8929 Other chronic pain: Secondary | ICD-10-CM

## 2022-02-08 DIAGNOSIS — M542 Cervicalgia: Secondary | ICD-10-CM | POA: Diagnosis not present

## 2022-02-08 DIAGNOSIS — R519 Headache, unspecified: Secondary | ICD-10-CM | POA: Diagnosis not present

## 2022-02-08 DIAGNOSIS — M6281 Muscle weakness (generalized): Secondary | ICD-10-CM

## 2022-02-08 NOTE — Therapy (Signed)
OUTPATIENT PHYSICAL THERAPY CERVICAL TREATMENT Progress Note Reporting Period 01/06/22 to 02/08/22 for visits 11-20  See note below for Objective Data and Assessment of Progress/Goals.      Patient Name: Veronica Galvan MRN: [id number] DOB:[birth year], 66 y.o., female Today's Date: 02/08/2022    Past Medical History:  Diagnosis Date   Allergy    Anxiety    Arthritis    hands   Cataract    Diabetes mellitus without complication ([place])    type 2   Family history of adverse reaction to anesthesia    son has malignant hyperthermia, daughter does not daughter recently had c section without problems   GERD (gastroesophageal reflux disease)    Headache    sinus   Hyperlipidemia    Hypertension    PONV (postoperative nausea and vomiting)    nausea only   Ulcer, stomach peptic yrs ago   Past Surgical History:  Procedure Laterality Date   APPENDECTOMY     both hells bone spur repair     both heels with metal clips   both shoulder rotator cuff repair     CESAREAN SECTION     x 1   COLONOSCOPY WITH PROPOFOL N/A 09/07/2016   Procedure: COLONOSCOPY WITH PROPOFOL;  Surgeon: Garlan Fair, MD;  Location: [facility];  Service: Endoscopy;  Laterality: N/A;   colonscopy  06/2011   polyps   EYE SURGERY     FRACTURE SURGERY     TUBAL LIGATION     VESICO-VAGINAL FISTULA REPAIR     Patient Active Problem List   Diagnosis Date Noted   Gastroesophageal reflux disease 11/16/2021   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 08/18/2021   Dermatofibroma 08/18/2021   History of malignant neoplasm of skin 08/18/2021   Lentigo 08/18/2021   Melanocytic nevi of trunk 08/18/2021   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   Rosacea 08/18/2021   Actinic keratosis 08/18/2021   Sensorineural hearing loss (SNHL) of left ear with unrestricted hearing of right ear 07/26/2021   Tinnitus of left ear 07/26/2021   Acute recurrent maxillary sinusitis 06/22/2021    Primary hypertension 01/12/2021   Hyperlipidemia associated with type 2 diabetes mellitus ([place]) 01/12/2021   Arthritis 01/12/2021   Type II diabetes mellitus ([place]) 11/25/2019   Ingrown toenail 09/05/2018   Neck pain 01/29/2018   Tick bite 10/24/2017    PCP: Jodi Mourning  REFERRING PROVIDER: Genia Harold  REFERRING DIAG: M54.2  THERAPY DIAG:  Cervicalgia  Muscle weakness (generalized)  Chronic intractable headache, unspecified headache type  Rationale for Evaluation and Treatment Rehabilitation  ONSET DATE: 11/08/21  SUBJECTIVE:  SUBJECTIVE STATEMENT:  My HA intensity has gone from the 90's to the 60's, I still have the same frequency which is daily.  At times my neck is feeling better and looser but I do tighten up quickly   PERTINENT HISTORY:  Arthritis, HTN, bilateral rotator cuff surgery 15-54yr ago, DM  PAIN:  Are you having pain? Yes: NPRS scale: 6/10 Pain location: head and neck, shoulders  Pain description: tight  Aggravating factors: moving Relieving factors: massage, standing in the shower  WEIGHT BEARING RESTRICTIONS No  FALLS:  Has patient fallen in last 6 months? No  LIVING ENVIRONMENT: Lives with: lives alone Lives in: House/apartment Stairs: Yes: External: 3 steps; can reach both Has following equipment at home: None  OCCUPATION: reitred  PLOF: Independent  PATIENT GOALS to loosen it up, find out what is causing my headaches   OBJECTIVE:   DIAGNOSTIC FINDINGS:   FINDINGS: Brain: No acute infarct, mass effect or extra-axial collection. No acute or chronic hemorrhage. Normal white matter signal, parenchymal volume and CSF spaces. The midline structures are normal.  Skull and upper cervical spine: Normal calvarium and skull base. Visualized  upper cervical spine and soft tissues are normal.   Sinuses/Orbits:No paranasal sinus fluid levels or advanced mucosal thickening. No mastoid or middle ear effusion. Normal orbits.   IMPRESSION: Normal brain MRI.  PATIENT SURVEYS:  FOTO 53/100 , FOTO 64 (predicted level)   COGNITION: Overall cognitive status: Within functional limits for tasks assessed  SENSATION: WFL  POSTURE: rounded shoulders, forward head, and increased thoracic kyphosis  PALPATION: TTP and sore along upper trap and cervical paraspinals, trigger points in R and L UT   CERVICAL ROM:   Active ROM A/PROM (deg) eval 12/30/21 01/05/22 9/28  Flexion 100% no pain WFL WFL WNL   [facility]   Right lateral flexion Mod limitation w/pain Mild limitations Mild limitations Severe limitation, leans with trunk   Left lateral flexion Mod limitation w/pain Mod limitations Mild limitations Severe limitation, leans with trunk   Right rotation 75% w/pain Mild limitation w/pain WFL pain at end range  Mild limitation   Left rotation 75% w/pain  Mild limitation w/pain  WFL pain at end range  Mild limitation    (Blank rows = not tested)  UPPER EXTREMITY ROM: WFL, limited in IR and ER due to prior RC surgery years ago   UPPER EXTREMITY MMT:  MMT Right eval Left eval Right 01/05/22 Left 01/05/22 R 9/28 L 9/28  Shoulder flexion 4+ 4+ 5 5 5 5   Shoulder extension        Shoulder abduction 4+ very painful 4+ 4+ with pain 5 5 5   Shoulder adduction        Shoulder extension        Shoulder internal rotation 4+ 4+ 5 5 5 5   Shoulder external rotation     5 5  Middle trapezius        Lower trapezius        Elbow flexion 5 5      Elbow extension 5 5      Wrist flexion        Wrist extension        Wrist ulnar deviation        Wrist radial deviation        Wrist pronation        Wrist supination        Grip strength         (  Blank rows = not tested)   TODAY'S TREATMENT:  02/08/22 STM to  the neck, upper traps, rhomboids Occipital release  DN to the upper traps and the occipital area Worked on core activation, she has a very difficult time activating  02/03/22 UBE L2 x71mns  NuStep L3 x5105ms  Modified crunches on BOSU 2x10 Piriformis stretch seated with assistance 15s each side  Feet on pball trunk rotations, knees to chest, and bridge x10  Pallof press with rotation 10# 2x10     02/01/22 UBE L2 x6m32m  Thoracic ext over half foam x10 HS stretch 30s  Sk2C 30s  Bridges x10 Trunk rotations x10  Pallof press 10# 2x10 Crunches blackTB 2x10 Blacktb ext 2x10    01/27/22  Re-assessment/appropriate education about goals and POC  TherEx   UBE L3.7 x3 minutes forward/3 minutes backward  Chin tucks x15 with 3 second holds Chin tucks with rotation x10 B  Chin tucks with lateral flexion x10 B Thoracic excursions x15 each direction  01/26/22 UBE level 3.7 x 6 minutes 10# straight arm pulls with cues for posture and core 10# AR press Feet on ball K2C, trunk rotation, small bridges and isometric abs LE stretches UE stretches, scalene stretches Manual cervical occipital release, STM to the TMJ and the neck/upper traps after DN  01/19/22 UBE L2- 3 min forward/3 min back Shoulder ext, rows, 10#, 2 x 10 reps Shoulder ER against G tband 2 x 10 reps Shoulder stretches, upper traps, doorway stretch at 60/90/120 STM to upper traps, cervical, cerv traction Lower trunk mobilization- seated, sitting on physiodisc, seated with rotation to each side with weight shifts over BOS, Mod TC and VC to activate lower trunk  01/17/22 UBE L2 x6mi28m Shoulder ext 10# 2x10 Cable rows 10# 2x10  Horizontal abd redTB 2x10 Bicep curls 25# 2x10 Tricep ext 35# 2x10 MH and traction to c-spine 10mi68m5#  01/13/22 UBE L4 x6mins49mec stretch with cables and in doorway 30s  R74s 25# 2x10 Lats 25# 2x10 Body blade x10 into flexion  Scapular lift offs w/red ball x10  Massage with gun to  UT, rhomboids   01/11/22 UBE L3 x6mins 75mTB horizontal 2x10  Shoulder ext 15# 2x10 Cable rows 10# 2x10 Lateral raises 3# 2x10 Shoulder flexion 3# 2x10  Shoulder diagonals 5# 2x10  DN to UT, subocciptials, TMJ  01/06/22 UBE L2 x6mins  42m, Passive stretching  Rows and lats 20# 2x10  RedTB horizontal ABD 2x10 ABD yellow band shoulder flexion 2x10 3 way shoulder yellow band 2x10   01/05/22 Progress note- recheck MMT, cervical ROM, FOTO  UBE L3 x6mins  D78mo bilateral UT, TMJ, and suboccipitals       PATIENT EDUCATION:  Education details: POC Person educated: Patient Education method: Explanation Education comprehension: verbalized understanding   HOME EXERCISE PROGRAM: Access Code: [email or web address] URL: [email or web address] Date: 12/01/2021 Prepared by: Mona SajjAndris BaumannMENT:  CLINICAL IMPRESSION: Patient with less intensity of HA but still the same frequency.  She reports that at times she has better ROM but will get tight and has some knots, I focused today on this, she is very weak in the core REHAB POTENTIAL: Good  CLINICAL DECISION MAKING: Stable/uncomplicated  EVALUATION COMPLEXITY: Low   GOALS: Goals reviewed with patient? Yes  SHORT TERM GOALS: Target date: 12/29/21  Patient will be independent with initial HEP.  Goal status: MET  LONG TERM GOALS: Target date: 02/26/22  Patient will be independent with advanced/ongoing HEP to improve outcomes and carryover.  Goal status: IN PROGRESS  2.  Patient will report 75% improvement in neck pain to improve QOL.   Baseline: 9/28- not even 50% improvement maybe 40% Goal status: IN PROGRESS  3.  Patient will demonstrate full pain free cervical ROM to complete ADLs and play with grandchildren with more ease. Goal status: IN PROGRESS 9/28- better but still limited (see above)  4.  Patient will demonstrate improved posture to decrease muscle imbalance and tightness.  Baseline: increased tightness  along cervical paraspinals Goal status: IN PROGRESS 9/28- ongoing impaired posture   5.  Patient will report 34 on FOTO to demonstrate improved functional ability.  Baseline:  9/28- 64  Goal status: MET  6.  Will demonstrate improved core strength as evidenced by passing double leg lower test  Baseline:  Goal status: INITIAL    PLAN: PT FREQUENCY: 2x/week  PT DURATION: 4 weeks  PLANNED INTERVENTIONS: Therapeutic exercises, Therapeutic activity, Neuromuscular re-education, Balance training, Gait training, Patient/Family education, Self Care, Joint mobilization, Dry Needling, Spinal manipulation, Spinal mobilization, Cryotherapy, Moist heat, Taping, Vasopneumatic device, Traction, Ionotophoresis 57m/ml Dexamethasone, and Manual therapy  PLAN FOR NEXT SESSION: Lower body mobilization, add trunk stabilization to her HEP. Birddogs and deadbugs, Chest press, Rows and lats work on core activation   MLum Babe PT 02/08/2022, 11:19 AM   [place] [address]Phone: [phone]  Fax:  [phone]

## 2022-02-09 NOTE — Therapy (Signed)
OUTPATIENT PHYSICAL THERAPY CERVICAL TREATMENT    Patient Name: Veronica Galvan MRN: [id number] DOB:June 02, 1955, 66 y.o., female Today's Date: 02/09/2022    Past Medical History:  Diagnosis Date   Allergy    Anxiety    Arthritis    hands   Cataract    Diabetes mellitus without complication ([place])    type 2   Family history of adverse reaction to anesthesia    son has malignant hyperthermia, daughter does not daughter recently had c section without problems   GERD (gastroesophageal reflux disease)    Headache    sinus   Hyperlipidemia    Hypertension    PONV (postoperative nausea and vomiting)    nausea only   Ulcer, stomach peptic yrs ago   Past Surgical History:  Procedure Laterality Date   APPENDECTOMY     both hells bone spur repair     both heels with metal clips   both shoulder rotator cuff repair     CESAREAN SECTION     x 1   COLONOSCOPY WITH PROPOFOL N/A 09/07/2016   Procedure: COLONOSCOPY WITH PROPOFOL;  Surgeon: Garlan Fair, MD;  Location: [facility];  Service: Endoscopy;  Laterality: N/A;   colonscopy  06/2011   polyps   EYE SURGERY     FRACTURE SURGERY     TUBAL LIGATION     VESICO-VAGINAL FISTULA REPAIR     Patient Active Problem List   Diagnosis Date Noted   Gastroesophageal reflux disease 11/16/2021   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 08/18/2021   Dermatofibroma 08/18/2021   History of malignant neoplasm of skin 08/18/2021   Lentigo 08/18/2021   Melanocytic nevi of trunk 08/18/2021   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   Rosacea 08/18/2021   Actinic keratosis 08/18/2021   Sensorineural hearing loss (SNHL) of left ear with unrestricted hearing of right ear 07/26/2021   Tinnitus of left ear 07/26/2021   Acute recurrent maxillary sinusitis 06/22/2021   Primary hypertension 01/12/2021   Hyperlipidemia associated with type 2 diabetes mellitus ([place]) 01/12/2021   Arthritis 01/12/2021   Type II  diabetes mellitus ([place]) 11/25/2019   Ingrown toenail 09/05/2018   Neck pain 01/29/2018   Tick bite 10/24/2017    PCP: Jodi Mourning  REFERRING PROVIDER: Genia Harold  REFERRING DIAG: M54.2  THERAPY DIAG:  No diagnosis found.  Rationale for Evaluation and Treatment Rehabilitation  ONSET DATE: 11/08/21  SUBJECTIVE:  SUBJECTIVE STATEMENT:  The dry needling really helped and the deep massage too, I had a pounding headache after I left but it felt good after. I am still getting headaches but they went from a 90% to 60%   PERTINENT HISTORY:  Arthritis, HTN, bilateral rotator cuff surgery 15-33yr ago, DM  PAIN:  Are you having pain? Yes: NPRS scale: 6/10 Pain location: head and neck, shoulders  Pain description: tight  Aggravating factors: moving Relieving factors: massage, standing in the shower  WEIGHT BEARING RESTRICTIONS No  FALLS:  Has patient fallen in last 6 months? No  LIVING ENVIRONMENT: Lives with: lives alone Lives in: House/apartment Stairs: Yes: External: 3 steps; can reach both Has following equipment at home: None  OCCUPATION: reitred  PLOF: Independent  PATIENT GOALS to loosen it up, find out what is causing my headaches   OBJECTIVE:   DIAGNOSTIC FINDINGS:   FINDINGS: Brain: No acute infarct, mass effect or extra-axial collection. No acute or chronic hemorrhage. Normal white matter signal, parenchymal volume and CSF spaces. The midline structures are normal.  Skull and upper cervical spine: Normal calvarium and skull base. Visualized upper cervical spine and soft tissues are normal.   Sinuses/Orbits:No paranasal sinus fluid levels or advanced mucosal thickening. No mastoid or middle ear effusion. Normal orbits.   IMPRESSION: Normal brain  MRI.  PATIENT SURVEYS:  FOTO 53/100 , FOTO 64 (predicted level)   COGNITION: Overall cognitive status: Within functional limits for tasks assessed  SENSATION: WFL  POSTURE: rounded shoulders, forward head, and increased thoracic kyphosis  PALPATION: TTP and sore along upper trap and cervical paraspinals, trigger points in R and L UT   CERVICAL ROM:   Active ROM A/PROM (deg) eval 12/30/21 01/05/22 9/28  Flexion 100% no pain WFL WFL WNL   [facility]   Right lateral flexion Mod limitation w/pain Mild limitations Mild limitations Severe limitation, leans with trunk   Left lateral flexion Mod limitation w/pain Mod limitations Mild limitations Severe limitation, leans with trunk   Right rotation 75% w/pain Mild limitation w/pain WFL pain at end range  Mild limitation   Left rotation 75% w/pain  Mild limitation w/pain  WFL pain at end range  Mild limitation    (Blank rows = not tested)  UPPER EXTREMITY ROM: WFL, limited in IR and ER due to prior RC surgery years ago   UPPER EXTREMITY MMT:  MMT Right eval Left eval Right 01/05/22 Left 01/05/22 R 9/28 L 9/28  Shoulder flexion 4+ 4+ 5 5 5 5   Shoulder extension        Shoulder abduction 4+ very painful 4+ 4+ with pain 5 5 5   Shoulder adduction        Shoulder extension        Shoulder internal rotation 4+ 4+ 5 5 5 5   Shoulder external rotation     5 5  Middle trapezius        Lower trapezius        Elbow flexion 5 5      Elbow extension 5 5      Wrist flexion        Wrist extension        Wrist ulnar deviation        Wrist radial deviation        Wrist pronation        Wrist supination        Grip strength         (  Blank rows = not tested)   TODAY'S TREATMENT:  02/10/22 Walk outdoors   NuStep L5x78mns  10# straight arm pulls with cues for posture and core 2x10  Feet on ball trunk rotation, small bridges x10 Deadbugs 2x5 20# AR press 2x10 BlackTB crunches 2x10 blackTB ext  2x10  02/08/22 STM to the neck, upper traps, rhomboids Occipital release  DN to the upper traps and the occipital area Worked on core activation, she has a very difficult time activating  02/03/22 UBE L2 x441ms  NuStep L3 x5m72m  Modified crunches on BOSU 2x10 Piriformis stretch seated with assistance 15s each side  Feet on pball trunk rotations, knees to chest, and bridge x10  Pallof press with rotation 10# 2x10     02/01/22 UBE L2 x6mi41m Thoracic ext over half foam x10 HS stretch 30s  Sk2C 30s  Bridges x10 Trunk rotations x10  Pallof press 10# 2x10 Crunches blackTB 2x10 Blacktb ext 2x10    01/27/22  Re-assessment/appropriate education about goals and POC  TherEx   UBE L3.7 x3 minutes forward/3 minutes backward  Chin tucks x15 with 3 second holds Chin tucks with rotation x10 B  Chin tucks with lateral flexion x10 B Thoracic excursions x15 each direction  01/26/22 UBE level 3.7 x 6 minutes 10# straight arm pulls with cues for posture and core 10# AR press Feet on ball K2C, trunk rotation, small bridges and isometric abs LE stretches UE stretches, scalene stretches Manual cervical occipital release, STM to the TMJ and the neck/upper traps after DN  01/19/22 UBE L2- 3 min forward/3 min back Shoulder ext, rows, 10#, 2 x 10 reps Shoulder ER against G tband 2 x 10 reps Shoulder stretches, upper traps, doorway stretch at 60/90/120 STM to upper traps, cervical, cerv traction Lower trunk mobilization- seated, sitting on physiodisc, seated with rotation to each side with weight shifts over BOS, Mod TC and VC to activate lower trunk  01/17/22 UBE L2 x6min36mShoulder ext 10# 2x10 Cable rows 10# 2x10  Horizontal abd redTB 2x10 Bicep curls 25# 2x10 Tricep ext 35# 2x10 MH and traction to c-spine 10min21m#  01/13/22 UBE L4 x6mins 78mc stretch with cables and in doorway 30s  Ro30s25# 2x10 Lats 25# 2x10 Body blade x10 into flexion  Scapular lift offs w/red ball x10   Massage with gun to UT, rhomboids   01/11/22 UBE L3 x6mins r76mB horizontal 2x10  Shoulder ext 15# 2x10 Cable rows 10# 2x10 Lateral raises 3# 2x10 Shoulder flexion 3# 2x10  Shoulder diagonals 5# 2x10  DN to UT, subocciptials, TMJ  01/06/22 UBE L2 x6mins  S29m Passive stretching  Rows and lats 20# 2x10  RedTB horizontal ABD 2x10 ABD yellow band shoulder flexion 2x10 3 way shoulder yellow band 2x10   01/05/22 Progress note- recheck MMT, cervical ROM, FOTO  UBE L3 x6mins  DN40m bilateral UT, TMJ, and suboccipitals       PATIENT EDUCATION:  Education details: POC Person educated: Patient Education method: Explanation Education comprehension: verbalized understanding   HOME EXERCISE PROGRAM: Access Code: [email or web address] URL: [email or web address] Date: 12/01/2021 Prepared by: Maat Kafer SajjaAndris BaumannENT:  CLINICAL IMPRESSION: Patient with less intensity of HA but still the every day.  We continued working on some UE and core strengthening today. Most difficulty with deadbugs due to core weakness.   REHAB POTENTIAL: Good  CLINICAL DECISION MAKING: Stable/uncomplicated  EVALUATION COMPLEXITY: Low   GOALS: Goals reviewed with patient? Yes  SHORT TERM GOALS: Target date: 12/29/21  Patient will be independent with initial HEP.  Goal status: MET  LONG TERM GOALS: Target date: 02/26/22  Patient will be independent with advanced/ongoing HEP to improve outcomes and carryover.  Goal status: IN PROGRESS  2.  Patient will report 75% improvement in neck pain to improve QOL.   Baseline: 9/28- not even 50% improvement maybe 40% Goal status: IN PROGRESS  3.  Patient will demonstrate full pain free cervical ROM to complete ADLs and play with grandchildren with more ease. Goal status: IN PROGRESS 9/28- better but still limited (see above)  4.  Patient will demonstrate improved posture to decrease muscle imbalance and tightness.  Baseline: increased tightness along  cervical paraspinals Goal status: IN PROGRESS 9/28- ongoing impaired posture   5.  Patient will report 48 on FOTO to demonstrate improved functional ability.  Baseline:  9/28- 64  Goal status: MET  6.  Will demonstrate improved core strength as evidenced by passing double leg lower test  Baseline:  Goal status: INITIAL    PLAN: PT FREQUENCY: 2x/week  PT DURATION: 4 weeks  PLANNED INTERVENTIONS: Therapeutic exercises, Therapeutic activity, Neuromuscular re-education, Balance training, Gait training, Patient/Family education, Self Care, Joint mobilization, Dry Needling, Spinal manipulation, Spinal mobilization, Cryotherapy, Moist heat, Taping, Vasopneumatic device, Traction, Ionotophoresis 40m/ml Dexamethasone, and Manual therapy  PLAN FOR NEXT SESSION: Lower body mobilization, add trunk stabilization to her HEP. Birddogs and deadbugs, Chest press, Rows and lats work on core activation   MLum Babe PT 02/09/2022, 2:39 PM   [place] [address]Phone: [phone]  Fax:  [phone]

## 2022-02-10 ENCOUNTER — Ambulatory Visit: Payer: Medicare Other

## 2022-02-10 DIAGNOSIS — R519 Headache, unspecified: Secondary | ICD-10-CM | POA: Diagnosis not present

## 2022-02-10 DIAGNOSIS — M6281 Muscle weakness (generalized): Secondary | ICD-10-CM | POA: Diagnosis not present

## 2022-02-10 DIAGNOSIS — M542 Cervicalgia: Secondary | ICD-10-CM | POA: Diagnosis not present

## 2022-02-10 DIAGNOSIS — G8929 Other chronic pain: Secondary | ICD-10-CM | POA: Diagnosis not present

## 2022-02-15 ENCOUNTER — Ambulatory Visit: Payer: Medicare Other | Admitting: Physical Therapy

## 2022-02-15 ENCOUNTER — Encounter: Payer: Self-pay | Admitting: Physical Therapy

## 2022-02-15 ENCOUNTER — Ambulatory Visit: Payer: Medicare Other | Admitting: Family

## 2022-02-15 DIAGNOSIS — M6281 Muscle weakness (generalized): Secondary | ICD-10-CM | POA: Diagnosis not present

## 2022-02-15 DIAGNOSIS — R519 Headache, unspecified: Secondary | ICD-10-CM | POA: Diagnosis not present

## 2022-02-15 DIAGNOSIS — M542 Cervicalgia: Secondary | ICD-10-CM | POA: Diagnosis not present

## 2022-02-15 DIAGNOSIS — G8929 Other chronic pain: Secondary | ICD-10-CM

## 2022-02-15 NOTE — Therapy (Signed)
OUTPATIENT PHYSICAL THERAPY CERVICAL TREATMENT    Patient Name: Veronica Galvan MRN: [id number] DOB:[birth year], 66 y.o., female Today's Date: 02/15/2022    Past Medical History:  Diagnosis Date   Allergy    Anxiety    Arthritis    hands   Cataract    Diabetes mellitus without complication ([place])    type 2   Family history of adverse reaction to anesthesia    son has malignant hyperthermia, daughter does not daughter recently had c section without problems   GERD (gastroesophageal reflux disease)    Headache    sinus   Hyperlipidemia    Hypertension    PONV (postoperative nausea and vomiting)    nausea only   Ulcer, stomach peptic yrs ago   Past Surgical History:  Procedure Laterality Date   APPENDECTOMY     both hells bone spur repair     both heels with metal clips   both shoulder rotator cuff repair     CESAREAN SECTION     x 1   COLONOSCOPY WITH PROPOFOL N/A 09/07/2016   Procedure: COLONOSCOPY WITH PROPOFOL;  Surgeon: Garlan Fair, MD;  Location: [facility];  Service: Endoscopy;  Laterality: N/A;   colonscopy  06/2011   polyps   EYE SURGERY     FRACTURE SURGERY     TUBAL LIGATION     VESICO-VAGINAL FISTULA REPAIR     Patient Active Problem List   Diagnosis Date Noted   Gastroesophageal reflux disease 11/16/2021   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 08/18/2021   Dermatofibroma 08/18/2021   History of malignant neoplasm of skin 08/18/2021   Lentigo 08/18/2021   Melanocytic nevi of trunk 08/18/2021   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   Rosacea 08/18/2021   Actinic keratosis 08/18/2021   Sensorineural hearing loss (SNHL) of left ear with unrestricted hearing of right ear 07/26/2021   Tinnitus of left ear 07/26/2021   Acute recurrent maxillary sinusitis 06/22/2021   Primary hypertension 01/12/2021   Hyperlipidemia associated with type 2 diabetes mellitus ([place]) 01/12/2021   Arthritis 01/12/2021   Type II  diabetes mellitus ([place]) 11/25/2019   Ingrown toenail 09/05/2018   Neck pain 01/29/2018   Tick bite 10/24/2017    PCP: Jodi Mourning  REFERRING PROVIDER: Genia Harold  REFERRING DIAG: M54.2  THERAPY DIAG:  Cervicalgia  Muscle weakness (generalized)  Chronic intractable headache, unspecified headache type  Rationale for Evaluation and Treatment Rehabilitation  ONSET DATE: 11/08/21  SUBJECTIVE:  SUBJECTIVE STATEMENT:  I feel like I am getting better, feel like the STM and the DN help a lot, I know I am still tight  PERTINENT HISTORY:  Arthritis, HTN, bilateral rotator cuff surgery 15-10yr ago, DM  PAIN:  Are you having pain? Yes: NPRS scale: 6/10 Pain location: head and neck, shoulders  Pain description: tight  Aggravating factors: moving Relieving factors: massage, standing in the shower  WEIGHT BEARING RESTRICTIONS No  FALLS:  Has patient fallen in last 6 months? No  LIVING ENVIRONMENT: Lives with: lives alone Lives in: House/apartment Stairs: Yes: External: 3 steps; can reach both Has following equipment at home: None  OCCUPATION: reitred  PLOF: Independent  PATIENT GOALS to loosen it up, find out what is causing my headaches   OBJECTIVE:   DIAGNOSTIC FINDINGS:   FINDINGS: Brain: No acute infarct, mass effect or extra-axial collection. No acute or chronic hemorrhage. Normal white matter signal, parenchymal volume and CSF spaces. The midline structures are normal.  Skull and upper cervical spine: Normal calvarium and skull base. Visualized upper cervical spine and soft tissues are normal.   Sinuses/Orbits:No paranasal sinus fluid levels or advanced mucosal thickening. No mastoid or middle ear effusion. Normal orbits.   IMPRESSION: Normal brain  MRI.  PATIENT SURVEYS:  FOTO 53/100 , FOTO 64 (predicted level)   COGNITION: Overall cognitive status: Within functional limits for tasks assessed  SENSATION: WFL  POSTURE: rounded shoulders, forward head, and increased thoracic kyphosis  PALPATION: TTP and sore along upper trap and cervical paraspinals, trigger points in R and L UT   CERVICAL ROM:   Active ROM A/PROM (deg) eval 12/30/21 01/05/22 9/28  Flexion 100% no pain WFL WFL WNL   Extension [facility] limitations w[facility] W[facility] WNL   Right lateral flexion Mod limitation w/pain Mild limitations Mild limitations Severe limitation, leans with trunk   Left lateral flexion Mod limitation w/pain Mod limitations Mild limitations Severe limitation, leans with trunk   Right rotation 75% w/pain Mild limitation w/pain WFL pain at end range  Mild limitation   Left rotation 75% w/pain  Mild limitation w/pain  WFL pain at end range  Mild limitation    (Blank rows = not tested)  UPPER EXTREMITY ROM: WFL, limited in IR and ER due to prior RC surgery years ago   UPPER EXTREMITY MMT:  MMT Right eval Left eval Right 01/05/22 Left 01/05/22 R 9/28 L 9/28  Shoulder flexion 4+ 4+ _0 Shoulder extension        Shoulder abduction 4+ very painful 4+ 4+ with pain _1 Shoulder adduction        Shoulder extension        Shoulder internal rotation 4+ 4+ _2 Shoulder external rotation     5 5  Middle trapezius        Lower trapezius        Elbow flexion 5 5      Elbow extension 5 5      Wrist flexion        Wrist extension        Wrist ulnar deviation        Wrist radial deviation        Wrist pronation        Wrist supination        Grip strength         (Blank rows = not tested)   TODAY'S TREATMENT:  02/15/22 DN to  the upper traps and the TMJ and the occipital Occiptial release Passive stretch Some gentel PA and rotational thoracic mobs STM to the TMJ, neck and upper traps some into the rhomboids  02/10/22 Walk outdoors    NuStep L5x11mns  10# straight arm pulls with cues for posture and core 2x10  Feet on ball trunk rotation, small bridges x10 Deadbugs 2x5 20# AR press 2x10 BlackTB crunches 2x10 blackTB ext 2x10  02/08/22 STM to the neck, upper traps, rhomboids Occipital release  DN to the upper traps and the occipital area Worked on core activation, she has a very difficult time activating  02/03/22 UBE L2 x442ms  NuStep L3 x5m61m  Modified crunches on BOSU 2x10 Piriformis stretch seated with assistance 15s each side  Feet on pball trunk rotations, knees to chest, and bridge x10  Pallof press with rotation 10# 2x10     02/01/22 UBE L2 x6mi40m Thoracic ext over half foam x10 HS stretch 30s  Sk2C 30s  Bridges x10 Trunk rotations x10  Pallof press 10# 2x10 Crunches blackTB 2x10 Blacktb ext 2x10    01/27/22  Re-assessment/appropriate education about goals and POC  TherEx   UBE L3.7 x3 minutes forward/3 minutes backward  Chin tucks x15 with 3 second holds Chin tucks with rotation x10 B  Chin tucks with lateral flexion x10 B Thoracic excursions x15 each direction  01/26/22 UBE level 3.7 x 6 minutes 10# straight arm pulls with cues for posture and core 10# AR press Feet on ball K2C, trunk rotation, small bridges and isometric abs LE stretches UE stretches, scalene stretches Manual cervical occipital release, STM to the TMJ and the neck/upper traps after DN  01/19/22 UBE L2- 3 min forward/3 min back Shoulder ext, rows, 10#, 2 x 10 reps Shoulder ER against G tband 2 x 10 reps Shoulder stretches, upper traps, doorway stretch at 60/90/120 STM to upper traps, cervical, cerv traction Lower trunk mobilization- seated, sitting on physiodisc, seated with rotation to each side with weight shifts over BOS, Mod TC and VC to activate lower trunk      PATIENT EDUCATION:  Education details: POC Person educated: Patient Education method: Explanation Education comprehension: verbalized  understanding   HOME EXERCISE PROGRAM: Access Code: [email or web address] URL: [email or web address] Date: 12/01/2021 Prepared by: MonaAndris BaumannSSESSMENT:  CLINICAL IMPRESSION: We are working some on the soft tissue to help with pain and HA, and then following up with exercises to help with strength and posture, she is very weak in the core.   REHAB POTENTIAL: Good  CLINICAL DECISION MAKING: Stable/uncomplicated  EVALUATION COMPLEXITY: Low   GOALS: Goals reviewed with patient? Yes  SHORT TERM GOALS: Target date: 12/29/21  Patient will be independent with initial HEP.  Goal status: MET  LONG TERM GOALS: Target date: 02/26/22  Patient will be independent with advanced/ongoing HEP to improve outcomes and carryover.  Goal status: IN PROGRESS  2.  Patient will report 75% improvement in neck pain to improve QOL.   Baseline: 9/28- not even 50% improvement maybe 40% Goal status: IN PROGRESS  3.  Patient will demonstrate full pain free cervical ROM to complete ADLs and play with grandchildren with more ease. Goal status: IN PROGRESS 9/28- better but still limited (see above)  4.  Patient will demonstrate improved posture to decrease muscle imbalance and tightness.  Baseline: increased tightness along cervical paraspinals Goal status: IN PROGRESS 9/28- ongoing impaired posture   5.  Patient will report 64 [id number] to demonstrate improved functional  ability.  Baseline:  9/28- 64  Goal status: MET  6.  Will demonstrate improved core strength as evidenced by passing double leg lower test  Baseline:  Goal status: INITIAL    PLAN: PT FREQUENCY: 2x/week  PT DURATION: 4 weeks  PLANNED INTERVENTIONS: Therapeutic exercises, Therapeutic activity, Neuromuscular re-education, Balance training, Gait training, Patient/Family education, Self Care, Joint mobilization, Dry Needling, Spinal manipulation, Spinal mobilization, Cryotherapy, Moist heat, Taping, Vasopneumatic device,  Traction, Ionotophoresis 3m/ml Dexamethasone, and Manual therapy  PLAN FOR NEXT SESSION: Lower body mobilization, add trunk stabilization to her HEP. Birddogs and deadbugs, Chest press, Rows and lats work on core activation   MLum Babe PT 02/15/2022, 2:52 PM   [place] [address]Phone: [phone]  Fax:  [phone]

## 2022-02-17 ENCOUNTER — Ambulatory Visit (INDEPENDENT_AMBULATORY_CARE_PROVIDER_SITE_OTHER): Payer: Medicare Other | Admitting: Family

## 2022-02-17 ENCOUNTER — Encounter: Payer: Self-pay | Admitting: Physical Therapy

## 2022-02-17 ENCOUNTER — Ambulatory Visit: Payer: Medicare Other | Admitting: Physical Therapy

## 2022-02-17 ENCOUNTER — Encounter: Payer: Self-pay | Admitting: Family

## 2022-02-17 ENCOUNTER — Ambulatory Visit (HOSPITAL_BASED_OUTPATIENT_CLINIC_OR_DEPARTMENT_OTHER)
Admission: RE | Admit: 2022-02-17 | Discharge: 2022-02-17 | Disposition: A | Discharge status: Home / Self Care | Payer: Medicare Other | Source: Ambulatory Visit | Attending: Family | Admitting: Family

## 2022-02-17 VITALS — BP 122/58 | HR 72 | Temp 97.8°F | Ht 61.0 in | Wt 207.5 lb

## 2022-02-17 DIAGNOSIS — G8929 Other chronic pain: Secondary | ICD-10-CM | POA: Diagnosis not present

## 2022-02-17 DIAGNOSIS — M542 Cervicalgia: Secondary | ICD-10-CM

## 2022-02-17 DIAGNOSIS — E119 Type 2 diabetes mellitus without complications: Secondary | ICD-10-CM

## 2022-02-17 DIAGNOSIS — E1169 Type 2 diabetes mellitus with other specified complication: Secondary | ICD-10-CM

## 2022-02-17 DIAGNOSIS — R519 Headache, unspecified: Secondary | ICD-10-CM | POA: Diagnosis not present

## 2022-02-17 DIAGNOSIS — E785 Hyperlipidemia, unspecified: Secondary | ICD-10-CM | POA: Diagnosis not present

## 2022-02-17 DIAGNOSIS — M6281 Muscle weakness (generalized): Secondary | ICD-10-CM | POA: Diagnosis not present

## 2022-02-17 LAB — HEMOGLOBIN A1C: Hgb A1c MFr Bld: 8.2 % — ABNORMAL HIGH (ref 4.6–6.5)

## 2022-02-17 LAB — COMPREHENSIVE METABOLIC PANEL
ALT: 39 U/L — ABNORMAL HIGH (ref 0–35)
AST: 24 U/L (ref 0–37)
Albumin: 4.4 g/dL (ref 3.5–5.2)
Alkaline Phosphatase: 74 U/L (ref 39–117)
BUN: 15 mg/dL (ref 6–23)
CO2: 27 mEq/L (ref 19–32)
Calcium: 9.9 mg/dL (ref 8.4–10.5)
Chloride: 99 mEq/L (ref 96–112)
Creatinine, Ser: 0.55 mg/dL (ref 0.40–1.20)
GFR: 95 mL/min (ref >60.00)
Glucose, Bld: 173 mg/dL — ABNORMAL HIGH (ref 70–99)
Potassium: 4.4 mEq/L (ref 3.5–5.1)
Sodium: 137 mEq/L (ref 135–145)
Total Bilirubin: 0.3 mg/dL (ref 0.2–1.2)
Total Protein: 7 g/dL (ref 6.0–8.3)

## 2022-02-17 LAB — CBC WITH DIFFERENTIAL/PLATELET
Basophils Absolute: 0.1 10*3/uL (ref 0.0–0.1)
Basophils Relative: 0.7 % (ref 0.0–3.0)
Eosinophils Absolute: 0.4 10*3/uL (ref 0.0–0.7)
Eosinophils Relative: 4.7 % (ref 0.0–5.0)
HCT: 40.7 % (ref 36.0–46.0)
Hemoglobin: 12.7 g/dL (ref 12.0–15.0)
Lymphocytes Relative: 27.6 % (ref 12.0–46.0)
Lymphs Abs: 2.2 10*3/uL (ref 0.7–4.0)
MCHC: 31.2 g/dL (ref 30.0–36.0)
MCV: 76.5 fl — ABNORMAL LOW (ref 78.0–100.0)
Monocytes Absolute: 0.5 10*3/uL (ref 0.1–1.0)
Monocytes Relative: 6.5 % (ref 3.0–12.0)
Neutro Abs: 4.7 10*3/uL (ref 1.4–7.7)
Neutrophils Relative %: 60.5 % (ref 43.0–77.0)
Platelets: 297 10*3/uL (ref 150.0–400.0)
RBC: 5.31 Mil/uL — ABNORMAL HIGH (ref 3.87–5.11)
RDW: 17.1 % — ABNORMAL HIGH (ref 11.5–15.5)
WBC: 7.8 10*3/uL (ref 4.0–10.5)

## 2022-02-17 LAB — MICROALBUMIN / CREATININE URINE RATIO
Creatinine,U: 30.1 mg/dL
Microalb Creat Ratio: 2.3 mg/g (ref 0.0–30.0)
Microalb, Ur: <0.7 mg/dL (ref 0.0–1.9)

## 2022-02-17 LAB — LIPID PANEL
Cholesterol: 122 mg/dL (ref 0–200)
HDL: 45.2 mg/dL (ref >39.00)
NonHDL: 76.79
Total CHOL/HDL Ratio: 3
Triglycerides: 287 mg/dL — ABNORMAL HIGH (ref 0.0–149.0)
VLDL: 57.4 mg/dL — ABNORMAL HIGH (ref 0.0–40.0)

## 2022-02-17 LAB — LDL CHOLESTEROL, DIRECT: Direct LDL: 58 mg/dL

## 2022-02-17 MED ORDER — SEMAGLUTIDE (2 MG/DOSE) 8 MG/3ML ~~LOC~~ SOPN: 2.0000 mg | PEN_INJECTOR | SUBCUTANEOUS | 0 refills | Status: DC

## 2022-02-17 NOTE — Progress Notes (Signed)
Veronica Galvan is a 66 y.o. 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   Rosacea 08/18/2021   Actinic keratosis 08/18/2021   Sensorineural hearing loss (SNHL) of left ear with unrestricted hearing of right ear 07/26/2021   Tinnitus of left ear 07/26/2021   Acute recurrent maxillary sinusitis 06/22/2021   Primary hypertension 01/12/2021   Hyperlipidemia associated with type 2 diabetes mellitus ([place]) 01/12/2021   Arthritis 01/12/2021   Type II diabetes mellitus ([place]) 11/25/2019   Ingrown toenail 09/05/2018   Neck pain 01/29/2018   Tick bite 10/24/2017    Current Outpatient Medications  Medication Sig Dispense Refill   ALPRAZolam (XANAX) 0.5 MG tablet Take 1 tablet (0.5 mg total) by mouth at bedtime as needed for anxiety. 30 tablet 0   Ascorbic Acid (VITAMIN C) 1000 MG tablet Take 1,000 mg by mouth every morning.     aspirin EC 81 MG tablet Take 81 mg by mouth every morning.     atorvastatin (LIPITOR) 40 MG tablet Take 1 tablet (40 mg total) by mouth every evening. 90 tablet 3   Calcium Carb-Cholecalciferol (CALCIUM 600+D3 PO) Take 1 tablet by mouth every morning.     celecoxib (CELEBREX) 200 MG capsule Take 1 capsule (200 mg total) by mouth daily. 90 capsule 3   Cholecalciferol (VITAMIN D) 2000 units tablet Take 2,000 Units by mouth daily.     Coenzyme Q10 300 MG CAPS Take 1 capsule by mouth every morning.     diclofenac Sodium (VOLTAREN) 1 % GEL Apply topically 4 (four) times daily.     glucose blood test strip Use as instructed 100 each 12   levocetirizine (XYZAL) 5 MG tablet Take 1 tablet (5 mg total) by  mouth every evening. 30 tablet 5   lisinopril (ZESTRIL) 5 MG tablet Take 1 tablet (5 mg total) by mouth 2 (two) times daily. 180 tablet 3   LUTEIN-ZEAXANTHIN PO Take 1 tablet by mouth every morning.     Multiple Vitamins-Minerals (MULTIVITAMIN WITH MINERALS) tablet Take 1 tablet by mouth every morning.     Omega-3 Fatty Acids (EQL OMEGA 3 FISH OIL) 1400 MG CAPS Take 2 capsules by mouth daily.     omeprazole (PRILOSEC) 20 MG capsule Take 1 capsule (20 mg total) by mouth every morning. 90 capsule 3   UNABLE TO FIND Med Name: Cinnamon/Cinsilin     VITAMIN E PO Take by mouth.     Semaglutide, 2 MG/DOSE, 8 MG/3ML SOPN Inject 2 mg as directed once a week. 3 mL 0   SYNJARDY XR 12.08-998 MG TB24 Take 1 tablet by mouth 2 (two) times daily. 180 tablet 3   No current facility-administered medications for this visit.    Allergies: Codeine, Benzonatate, and Epinephrine  Past Medical History:  Diagnosis Date   Allergy    Anxiety    Arthritis    hands   Cataract    Diabetes mellitus without complication (HCC)    type 2   Family history of adverse reaction to anesthesia    son has malignant hyperthermia, daughter does not daughter recently had c section without problems   GERD (gastroesophageal reflux disease)  Headache    sinus   Hyperlipidemia    Hypertension    PONV (postoperative nausea and vomiting)    nausea only   Ulcer, stomach peptic yrs ago    Past Surgical History:  Procedure Laterality Date   APPENDECTOMY     both hells bone spur repair     both heels with metal clips   both shoulder rotator cuff repair     CESAREAN SECTION     x 1   COLONOSCOPY WITH PROPOFOL N/A 09/07/2016   Procedure: COLONOSCOPY WITH PROPOFOL;  Surgeon: Garlan Fair, MD;  Location: [facility];  Service: Endoscopy;  Laterality: N/A;   colonscopy  06/2011   polyps   EYE SURGERY     FRACTURE SURGERY     TUBAL LIGATION     VESICO-VAGINAL FISTULA REPAIR      Family History  Problem Relation Age  of Onset   Hypertension Mother    COPD Mother    Cancer Mother    Arthritis Mother    Hypertension Father    Hyperlipidemia Father    Diabetes Father    Cancer Father    Alcohol abuse Father    Diabetes Brother    Alcohol abuse Brother    Hypertension Brother    Arthritis Maternal Grandmother    Birth defects Maternal Grandmother    Diabetes Paternal Grandmother    Heart disease Paternal Grandfather    Diabetes Paternal Aunt    Diabetes Paternal Aunt    Obesity Son     Social History   Tobacco Use   Smoking status: Former    Packs/day: 1.00    Years: 14.00    Total pack years: 14.00    Types: Cigarettes   Smokeless tobacco: Never   Tobacco comments:    quit 31 yrs ago  Substance Use Topics   Alcohol use: Yes    Comment: rare    Subjective:  3 month follow up on Type 2 Diabetes; has been doing regular PT for neck/ headaches and getting improvement in headaches; is exercising regularly and working on stretching exercises;  Denies any chest pain, shortness of breath, blurred vision or headache No concerns for episodes of low blood sugar;     Objective:  Vitals:   02/17/22 0910  BP: (!) 122/58  Pulse: 72  Temp: 97.8 F (36.6 C)  TempSrc: Oral  SpO2: 97%  Weight: 207 lb 8 oz (94.1 kg)  Height: 5' 1"  (1.549 m)    General: Well developed, well nourished, in no acute distress  Skin : Warm and dry.  Head: Normocephalic and atraumatic  Eyes: Sclera and conjunctiva clear; pupils round and reactive to light; extraocular movements intact  Ears: External normal; canals clear; tympanic membranes normal  Oropharynx: Pink, supple. No suspicious lesions  Neck: Supple without thyromegaly, adenopathy  Lungs: Respirations unlabored; clear to auscultation bilaterally without wheeze, rales, rhonchi  CVS exam: normal rate and regular rhythm.  Neurologic: Alert and oriented; speech intact; face symmetrical; moves all extremities well; CNII-XII intact without focal deficit    Assessment:  1. Type 2 diabetes mellitus without complication, without long-term current use of insulin ([place])   2. Hyperlipidemia associated with type 2 diabetes mellitus ([place])   3. Chronic neck pain     Plan:  Update labs today; continue Synjardy and Ozempic;  patient is working on diet and exercise changes; if hgba1c is trending downward, will watch for 3 more months before adding new medications; she is given printed  Rx for Ozempic to fill at local pharmacy since her mail order pharmacy is having supply issues;  Stable; lab updated and refill is up to date;   Update cervical X-ray today; is getting good improvement with PT for neck pain and chronic headaches;   No follow-ups on file.  Orders Placed This Encounter  Procedures   DG Cervical Spine Complete    Standing Status:   Future    Number of Occurrences:   1    Standing Expiration Date:   02/18/2023    Order Specific Question:   Reason for Exam (SYMPTOM  OR DIAGNOSIS REQUIRED)    Answer:   neck pain    Order Specific Question:   Preferred imaging location?    Answer:   [facility]   CBC with Differential/Platelet   Comp Met (CMET)   Hemoglobin A1c   Urine Microalbumin w/creat. ratio   Lipid panel    Requested Prescriptions   Signed Prescriptions Disp Refills   Semaglutide, 2 MG/DOSE, 8 MG/3ML SOPN 3 mL 0    Sig: Inject 2 mg as directed once a week.

## 2022-02-17 NOTE — Therapy (Signed)
OUTPATIENT PHYSICAL THERAPY CERVICAL TREATMENT    Patient Name: Veronica Galvan MRN: [id number] DOB:Apr 26, 1956, 66 y.o., female Today's Date: 02/17/2022    Past Medical History:  Diagnosis Date   Allergy    Anxiety    Arthritis    hands   Cataract    Diabetes mellitus without complication ([place])    type 2   Family history of adverse reaction to anesthesia    son has malignant hyperthermia, daughter does not daughter recently had c section without problems   GERD (gastroesophageal reflux disease)    Headache    sinus   Hyperlipidemia    Hypertension    PONV (postoperative nausea and vomiting)    nausea only   Ulcer, stomach peptic yrs ago   Past Surgical History:  Procedure Laterality Date   APPENDECTOMY     both hells bone spur repair     both heels with metal clips   both shoulder rotator cuff repair     CESAREAN SECTION     x 1   COLONOSCOPY WITH PROPOFOL N/A 09/07/2016   Procedure: COLONOSCOPY WITH PROPOFOL;  Surgeon: Garlan Fair, MD;  Location: [facility];  Service: Endoscopy;  Laterality: N/A;   colonscopy  06/2011   polyps   EYE SURGERY     FRACTURE SURGERY     TUBAL LIGATION     VESICO-VAGINAL FISTULA REPAIR     Patient Active Problem List   Diagnosis Date Noted   Gastroesophageal reflux disease 11/16/2021   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 08/18/2021   Dermatofibroma 08/18/2021   History of malignant neoplasm of skin 08/18/2021   Lentigo 08/18/2021   Melanocytic nevi of trunk 08/18/2021   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   Rosacea 08/18/2021   Actinic keratosis 08/18/2021   Sensorineural hearing loss (SNHL) of left ear with unrestricted hearing of right ear 07/26/2021   Tinnitus of left ear 07/26/2021   Acute recurrent maxillary sinusitis 06/22/2021   Primary hypertension 01/12/2021   Hyperlipidemia associated with type 2 diabetes mellitus ([place]) 01/12/2021   Arthritis 01/12/2021   Type II  diabetes mellitus ([place]) 11/25/2019   Ingrown toenail 09/05/2018   Neck pain 01/29/2018   Tick bite 10/24/2017    PCP: Jodi Mourning  REFERRING PROVIDER: Genia Harold  REFERRING DIAG: M54.2  THERAPY DIAG:  Cervicalgia  Muscle weakness (generalized)  Chronic intractable headache, unspecified headache type  Rationale for Evaluation and Treatment Rehabilitation  ONSET DATE: 11/08/21  SUBJECTIVE:  SUBJECTIVE STATEMENT:  Doing well, I feel really good after I leave but the pain and stiffness return , pain intensity is less, I wake up feeling better which is big, I used to wake up and dread the pain  PERTINENT HISTORY:  Arthritis, HTN, bilateral rotator cuff surgery 15-59yr ago, DM  PAIN:  Are you having pain? Yes: NPRS scale: 6/10 Pain location: head and neck, shoulders  Pain description: tight  Aggravating factors: moving Relieving factors: massage, standing in the shower  WEIGHT BEARING RESTRICTIONS No  FALLS:  Has patient fallen in last 6 months? No  LIVING ENVIRONMENT: Lives with: lives alone Lives in: House/apartment Stairs: Yes: External: 3 steps; can reach both Has following equipment at home: None  OCCUPATION: reitred  PLOF: Independent  PATIENT GOALS to loosen it up, find out what is causing my headaches   OBJECTIVE:   DIAGNOSTIC FINDINGS:   FINDINGS: Brain: No acute infarct, mass effect or extra-axial collection. No acute or chronic hemorrhage. Normal white matter signal, parenchymal volume and CSF spaces. The midline structures are normal.  Skull and upper cervical spine: Normal calvarium and skull base. Visualized upper cervical spine and soft tissues are normal.   Sinuses/Orbits:No paranasal sinus fluid levels or advanced mucosal thickening. No  mastoid or middle ear effusion. Normal orbits.   IMPRESSION: Normal brain MRI.  PATIENT SURVEYS:  FOTO 53/100 , FOTO 64 (predicted level)   COGNITION: Overall cognitive status: Within functional limits for tasks assessed  SENSATION: WFL  POSTURE: rounded shoulders, forward head, and increased thoracic kyphosis  PALPATION: TTP and sore along upper trap and cervical paraspinals, trigger points in R and L UT   CERVICAL ROM:   Active ROM A/PROM (deg) eval 12/30/21 01/05/22 9/28  Flexion 100% no pain WFL WFL WNL   Extension Mild limitations w/[facility] W[facility]   Right lateral flexion Mod limitation w/pain Mild limitations Mild limitations Severe limitation, leans with trunk   Left lateral flexion Mod limitation w/pain Mod limitations Mild limitations Severe limitation, leans with trunk   Right rotation 75% w/pain Mild limitation w/pain WFL pain at end range  Mild limitation   Left rotation 75% w/pain  Mild limitation w/pain  WFL pain at end range  Mild limitation    (Blank rows = not tested)  UPPER EXTREMITY ROM: WFL, limited in IR and ER due to prior RC surgery years ago   UPPER EXTREMITY MMT:  MMT Right eval Left eval Right 01/05/22 Left 01/05/22 R 9/28 L 9/28  Shoulder flexion 4+ 4+ _0 Shoulder extension        Shoulder abduction 4+ very painful 4+ 4+ with pain _1 Shoulder adduction        Shoulder extension        Shoulder internal rotation 4+ 4+ _2 Shoulder external rotation     5 5  Middle trapezius        Lower trapezius        Elbow flexion 5 5      Elbow extension 5 5      Wrist flexion        Wrist extension        Wrist ulnar deviation        Wrist radial deviation        Wrist pronation        Wrist supination        Grip strength         (  Blank rows = not tested)   TODAY'S TREATMENT:  02/17/22 UBE level 4 x 5 minutes Seated row 20# 2x10 Lats 20# 2x10 5# straight arm pulls with cues for core and posture 20# farmer carry one  lap Weighted ball over head lift Yellow tband horizontal abduction W backs Red tband hip extension and abduction Feet on ball K2C, trunk rotation, small bridges and isometric abs Passive stretches of the LE's Shoulder and neck streches   02/15/22 DN to the upper traps and the TMJ and the occipital Occiptial release Passive stretch Some gentel PA and rotational thoracic mobs STM to the TMJ, neck and upper traps some into the rhomboids  02/10/22 Walk outdoors   NuStep L5x94mns  10# straight arm pulls with cues for posture and core 2x10  Feet on ball trunk rotation, small bridges x10 Deadbugs 2x5 20# AR press 2x10 BlackTB crunches 2x10 blackTB ext 2x10  02/08/22 STM to the neck, upper traps, rhomboids Occipital release  DN to the upper traps and the occipital area Worked on core activation, she has a very difficult time activating  02/03/22 UBE L2 x431ms  NuStep L3 x5m85m  Modified crunches on BOSU 2x10 Piriformis stretch seated with assistance 15s each side  Feet on pball trunk rotations, knees to chest, and bridge x10  Pallof press with rotation 10# 2x10     02/01/22 UBE L2 x6mi64m Thoracic ext over half foam x10 HS stretch 30s  Sk2C 30s  Bridges x10 Trunk rotations x10  Pallof press 10# 2x10 Crunches blackTB 2x10 Blacktb ext 2x10    01/27/22  Re-assessment/appropriate education about goals and POC  TherEx   UBE L3.7 x3 minutes forward/3 minutes backward  Chin tucks x15 with 3 second holds Chin tucks with rotation x10 B  Chin tucks with lateral flexion x10 B Thoracic excursions x15 each direction  01/26/22 UBE level 3.7 x 6 minutes 10# straight arm pulls with cues for posture and core 10# AR press Feet on ball K2C, trunk rotation, small bridges and isometric abs LE stretches UE stretches, scalene stretches Manual cervical occipital release, STM to the TMJ and the neck/upper traps after DN  01/19/22 UBE L2- 3 min forward/3 min back Shoulder ext,  rows, 10#, 2 x 10 reps Shoulder ER against G tband 2 x 10 reps Shoulder stretches, upper traps, doorway stretch at 60/90/120 STM to upper traps, cervical, cerv traction Lower trunk mobilization- seated, sitting on physiodisc, seated with rotation to each side with weight shifts over BOS, Mod TC and VC to activate lower trunk      PATIENT EDUCATION:  Education details: POC Person educated: Patient Education method: Explanation Education comprehension: verbalized understanding   HOME EXERCISE PROGRAM: Access Code: [email or web address] URL: [email or web address] Date: 12/01/2021 Prepared by: MonaAndris BaumannSSESSMENT:  CLINICAL IMPRESSION: Worked on core and flexibility, she is very tight and needs many cues to relax and allow movements this is very difficult for her, core is also very weak   REHAB POTENTIAL: Good  CLINICAL DECISION MAKING: Stable/uncomplicated  EVALUATION COMPLEXITY: Low   GOALS: Goals reviewed with patient? Yes  SHORT TERM GOALS: Target date: 12/29/21  Patient will be independent with initial HEP.  Goal status: MET  LONG TERM GOALS: Target date: 02/26/22  Patient will be independent with advanced/ongoing HEP to improve outcomes and carryover.  Goal status: IN PROGRESS  2.  Patient will report 75% improvement in neck pain to improve QOL.   Baseline: 9/28- not even 50% improvement maybe 40% Goal status:  IN PROGRESS  3.  Patient will demonstrate full pain free cervical ROM to complete ADLs and play with grandchildren with more ease. Goal status: IN PROGRESS 9/28- better but still limited (see above)  4.  Patient will demonstrate improved posture to decrease muscle imbalance and tightness.  Baseline: increased tightness along cervical paraspinals Goal status: IN PROGRESS 9/28- ongoing impaired posture   5.  Patient will report 21 on FOTO to demonstrate improved functional ability.  Baseline:  9/28- 64  Goal status: MET  6.  Will demonstrate  improved core strength as evidenced by passing double leg lower test  Baseline:  Goal status: INITIAL    PLAN: PT FREQUENCY: 2x/week  PT DURATION: 4 weeks  PLANNED INTERVENTIONS: Therapeutic exercises, Therapeutic activity, Neuromuscular re-education, Balance training, Gait training, Patient/Family education, Self Care, Joint mobilization, Dry Needling, Spinal manipulation, Spinal mobilization, Cryotherapy, Moist heat, Taping, Vasopneumatic device, Traction, Ionotophoresis 1m/ml Dexamethasone, and Manual therapy  PLAN FOR NEXT SESSION: Lower body mobilization, add trunk stabilization to her HEP. Birddogs and deadbugs, Chest press, Rows and lats work on core activation   MLum Babe PT 02/17/2022, 2:02 PM   [place] [id number]: [phone]  Fax:  [phone]

## 2022-02-18 ENCOUNTER — Ambulatory Visit: Payer: Medicare Other | Admitting: Family

## 2022-02-22 ENCOUNTER — Encounter: Payer: Self-pay | Admitting: Physical Therapy

## 2022-02-22 ENCOUNTER — Ambulatory Visit: Payer: Medicare Other | Admitting: Physical Therapy

## 2022-02-22 DIAGNOSIS — R519 Headache, unspecified: Secondary | ICD-10-CM

## 2022-02-22 DIAGNOSIS — M542 Cervicalgia: Secondary | ICD-10-CM

## 2022-02-22 DIAGNOSIS — M6281 Muscle weakness (generalized): Secondary | ICD-10-CM

## 2022-02-22 DIAGNOSIS — G8929 Other chronic pain: Secondary | ICD-10-CM | POA: Diagnosis not present

## 2022-02-22 NOTE — Therapy (Signed)
OUTPATIENT PHYSICAL THERAPY CERVICAL TREATMENT    Patient Name: Veronica Galvan MRN: [id number] DOB:1955/07/13, 66 y.o., female Today's Date: 02/22/2022    Past Medical History:  Diagnosis Date   Allergy    Anxiety    Arthritis    hands   Cataract    Diabetes mellitus without complication ([place])    type 2   Family history of adverse reaction to anesthesia    son has malignant hyperthermia, daughter does not daughter recently had c section without problems   GERD (gastroesophageal reflux disease)    Headache    sinus   Hyperlipidemia    Hypertension    PONV (postoperative nausea and vomiting)    nausea only   Ulcer, stomach peptic yrs ago   Past Surgical History:  Procedure Laterality Date   APPENDECTOMY     both hells bone spur repair     both heels with metal clips   both shoulder rotator cuff repair     CESAREAN SECTION     x 1   COLONOSCOPY WITH PROPOFOL N/A 09/07/2016   Procedure: COLONOSCOPY WITH PROPOFOL;  Surgeon: Garlan Fair, MD;  Location: [facility];  Service: Endoscopy;  Laterality: N/A;   colonscopy  06/2011   polyps   EYE SURGERY     FRACTURE SURGERY     TUBAL LIGATION     VESICO-VAGINAL FISTULA REPAIR     Patient Active Problem List   Diagnosis Date Noted   Gastroesophageal reflux disease 11/16/2021   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 08/18/2021   Dermatofibroma 08/18/2021   History of malignant neoplasm of skin 08/18/2021   Lentigo 08/18/2021   Melanocytic nevi of trunk 08/18/2021   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   Rosacea 08/18/2021   Actinic keratosis 08/18/2021   Sensorineural hearing loss (SNHL) of left ear with unrestricted hearing of right ear 07/26/2021   Tinnitus of left ear 07/26/2021   Acute recurrent maxillary sinusitis 06/22/2021   Primary hypertension 01/12/2021   Hyperlipidemia associated with type 2 diabetes mellitus ([place]) 01/12/2021   Arthritis 01/12/2021   Type II  diabetes mellitus ([place]) 11/25/2019   Ingrown toenail 09/05/2018   Neck pain 01/29/2018   Tick bite 10/24/2017    PCP: Jodi Mourning  REFERRING PROVIDER: Genia Harold  REFERRING DIAG: M54.2  THERAPY DIAG:  Cervicalgia  Muscle weakness (generalized)  Chronic intractable headache, unspecified headache type  Rationale for Evaluation and Treatment Rehabilitation  ONSET DATE: 11/08/21  SUBJECTIVE:  SUBJECTIVE STATEMENT:  HAd CT this is the results:  IMPRESSION: 1. Degenerative changes throughout the mid/lower cervical spine, mild to moderate in degree, as detailed above. If any associated radiculopathic symptoms, would consider nonemergent cervical spine MRI to exclude associated nerve root impingement. 2. No acute findings.    PERTINENT HISTORY:  Arthritis, HTN, bilateral rotator cuff surgery 15-63yr ago, DM  PAIN:  Are you having pain? Yes: NPRS scale: 6/10 Pain location: head and neck, shoulders  Pain description: tight  Aggravating factors: moving Relieving factors: massage, standing in the shower  WEIGHT BEARING RESTRICTIONS No  FALLS:  Has patient fallen in last 6 months? No  LIVING ENVIRONMENT: Lives with: lives alone Lives in: House/apartment Stairs: Yes: External: 3 steps; can reach both Has following equipment at home: None  OCCUPATION: reitred  PLOF: Independent  PATIENT GOALS to loosen it up, find out what is causing my headaches   OBJECTIVE:   DIAGNOSTIC FINDINGS:   FINDINGS: Brain: No acute infarct, mass effect or extra-axial collection. No acute or chronic hemorrhage. Normal white matter signal, parenchymal volume and CSF spaces. The midline structures are normal.  Skull and upper cervical spine: Normal calvarium and skull base. Visualized  upper cervical spine and soft tissues are normal.   Sinuses/Orbits:No paranasal sinus fluid levels or advanced mucosal thickening. No mastoid or middle ear effusion. Normal orbits.   IMPRESSION: Normal brain MRI.  PATIENT SURVEYS:  FOTO 53/100 , FOTO 64 (predicted level)   COGNITION: Overall cognitive status: Within functional limits for tasks assessed  SENSATION: WFL  POSTURE: rounded shoulders, forward head, and increased thoracic kyphosis  PALPATION: TTP and sore along upper trap and cervical paraspinals, trigger points in R and L UT   CERVICAL ROM:   Active ROM A/PROM (deg) eval 12/30/21 01/05/22 9/28  Flexion 100% no pain WFL WFL WNL   Extension [facility] limitations [facility]   Right lateral flexion Mod limitation w/pain Mild limitations Mild limitations Severe limitation, leans with trunk   Left lateral flexion Mod limitation w/pain Mod limitations Mild limitations Severe limitation, leans with trunk   Right rotation 75% w/pain Mild limitation w/pain WFL pain at end range  Mild limitation   Left rotation 75% w/pain  Mild limitation w/pain  WFL pain at end range  Mild limitation    (Blank rows = not tested)  UPPER EXTREMITY ROM: WFL, limited in IR and ER due to prior RC surgery years ago   UPPER EXTREMITY MMT:  MMT Right eval Left eval Right 01/05/22 Left 01/05/22 R 9/28 L 9/28  Shoulder flexion 4+ 4+ 5 5 5 5   Shoulder extension        Shoulder abduction 4+ very painful 4+ 4+ with pain 5 5 5   Shoulder adduction        Shoulder extension        Shoulder internal rotation 4+ 4+ 5 5 5 5   Shoulder external rotation     5 5  Middle trapezius        Lower trapezius        Elbow flexion 5 5      Elbow extension 5 5      Wrist flexion        Wrist extension        Wrist ulnar deviation        Wrist radial deviation        Wrist pronation        Wrist supination  Grip strength         (Blank rows = not tested)   TODAY'S TREATMENT:  02/22/22 Nustep  Level 6 x 6 minutes 10# straight arm pulls 10# AR press W backsLeg press 40# 2x12 25# Lats 2x12 Leg press 40# 5# marches  Feet on ball K2C, trunk rotation, small bridges, isometric abs Passive stretch LE's  02/17/22 UBE level 4 x 5 minutes Seated row 20# 2x10 Lats 20# 2x10 5# straight arm pulls with cues for core and posture 20# farmer carry one lap Weighted ball over head lift Yellow tband horizontal abduction W backs Red tband hip extension and abduction Feet on ball K2C, trunk rotation, small bridges and isometric abs Passive stretches of the LE's Shoulder and neck streches   02/15/22 DN to the upper traps and the TMJ and the occipital Occiptial release Passive stretch Some gentel PA and rotational thoracic mobs STM to the TMJ, neck and upper traps some into the rhomboids  02/10/22 Walk outdoors   NuStep L5x4mns  10# straight arm pulls with cues for posture and core 2x10  Feet on ball trunk rotation, small bridges x10 Deadbugs 2x5 20# AR press 2x10 BlackTB crunches 2x10 blackTB ext 2x10  02/08/22 STM to the neck, upper traps, rhomboids Occipital release  DN to the upper traps and the occipital area Worked on core activation, she has a very difficult time activating  02/03/22 UBE L2 x431ms  NuStep L3 x5m3m  Modified crunches on BOSU 2x10 Piriformis stretch seated with assistance 15s each side  Feet on pball trunk rotations, knees to chest, and bridge x10  Pallof press with rotation 10# 2x10     02/01/22 UBE L2 x6mi18m Thoracic ext over half foam x10 HS stretch 30s  Sk2C 30s  Bridges x10 Trunk rotations x10  Pallof press 10# 2x10 Crunches blackTB 2x10 Blacktb ext 2x10    01/27/22  Re-assessment/appropriate education about goals and POC  TherEx   UBE L3.7 x3 minutes forward/3 minutes backward  Chin tucks x15 with 3 second holds Chin tucks with rotation x10 B  Chin tucks with lateral flexion x10 B Thoracic excursions x15 each  direction  01/26/22 UBE level 3.7 x 6 minutes 10# straight arm pulls with cues for posture and core 10# AR press Feet on ball K2C, trunk rotation, small bridges and isometric abs LE stretches UE stretches, scalene stretches Manual cervical occipital release, STM to the TMJ and the neck/upper traps after DN  01/19/22 UBE L2- 3 min forward/3 min back Shoulder ext, rows, 10#, 2 x 10 reps Shoulder ER against G tband 2 x 10 reps Shoulder stretches, upper traps, doorway stretch at 60/90/120 STM to upper traps, cervical, cerv traction Lower trunk mobilization- seated, sitting on physiodisc, seated with rotation to each side with weight shifts over BOS, Mod TC and VC to activate lower trunk      PATIENT EDUCATION:  Education details: POC Person educated: Patient Education method: Explanation Education comprehension: verbalized understanding   HOME EXERCISE PROGRAM: Access Code: [email or web address] URL: [email or web address] Date: 12/01/2021 Prepared by: MonaAndris BaumannSSESSMENT:  CLINICAL IMPRESSION: WE are working one day a week with strength and core activation and one day with the mm tightness, spasms and trigger points.  She has had difficulty with core activation but feeling that it is starting to happen, she also has a very difficult time relaxing   REHAB POTENTIAL: Good  CLINICAL DECISION MAKING: Stable/uncomplicated  EVALUATION COMPLEXITY: Low   GOALS: Goals reviewed with patient? Yes  SHORT TERM GOALS: Target date: 12/29/21  Patient will be independent with initial HEP.  Goal status: MET  LONG TERM GOALS: Target date: 02/26/22  Patient will be independent with advanced/ongoing HEP to improve outcomes and carryover.  Goal status: IN PROGRESS  2.  Patient will report 75% improvement in neck pain to improve QOL.   Baseline: 9/28- not even 50% improvement maybe 40% Goal status: IN PROGRESS  3.  Patient will demonstrate full pain free cervical ROM to  complete ADLs and play with grandchildren with more ease. Goal status: IN PROGRESS 9/28- better but still limited (see above)  4.  Patient will demonstrate improved posture to decrease muscle imbalance and tightness.  Baseline: increased tightness along cervical paraspinals Goal status: IN PROGRESS 9/28- ongoing impaired posture   5.  Patient will report 51 on FOTO to demonstrate improved functional ability.  Baseline:  9/28- 64  Goal status: MET  6.  Will demonstrate improved core strength as evidenced by passing double leg lower test  Baseline:  Goal status: INITIAL    PLAN: PT FREQUENCY: 2x/week  PT DURATION: 4 weeks  PLANNED INTERVENTIONS: Therapeutic exercises, Therapeutic activity, Neuromuscular re-education, Balance training, Gait training, Patient/Family education, Self Care, Joint mobilization, Dry Needling, Spinal manipulation, Spinal mobilization, Cryotherapy, Moist heat, Taping, Vasopneumatic device, Traction, Ionotophoresis 72m/ml Dexamethasone, and Manual therapy  PLAN FOR NEXT SESSION: Lower body mobilization, add trunk stabilization to her HEP. Birddogs and deadbugs, Chest press, Rows and lats work on core activation   MLum Babe PT 02/22/2022, 2:42 PM   [place] [id number]: [phone]  Fax:  [phone]

## 2022-02-23 ENCOUNTER — Telehealth: Payer: Self-pay | Admitting: Family

## 2022-02-23 ENCOUNTER — Encounter: Payer: Self-pay | Admitting: Family

## 2022-02-23 NOTE — Telephone Encounter (Signed)
Pharmacy called to let us know that they are having trouble getting the '2mg'$  Ozempic, and because of that the patient would like to go back to her '1mg'$  dosage for the month. Please advise.     [facility] [id number] [place], [place] - [place] [address] [place], [place] [address] Phone: [phone]  Fax: [phone]

## 2022-02-24 ENCOUNTER — Other Ambulatory Visit: Payer: Self-pay | Admitting: Family

## 2022-02-24 ENCOUNTER — Encounter: Payer: Self-pay | Admitting: Physical Therapy

## 2022-02-24 ENCOUNTER — Ambulatory Visit: Payer: Medicare Other | Admitting: Physical Therapy

## 2022-02-24 DIAGNOSIS — R519 Headache, unspecified: Secondary | ICD-10-CM | POA: Diagnosis not present

## 2022-02-24 DIAGNOSIS — M542 Cervicalgia: Secondary | ICD-10-CM | POA: Diagnosis not present

## 2022-02-24 DIAGNOSIS — M6281 Muscle weakness (generalized): Secondary | ICD-10-CM | POA: Diagnosis not present

## 2022-02-24 DIAGNOSIS — G8929 Other chronic pain: Secondary | ICD-10-CM

## 2022-02-24 MED ORDER — SEMAGLUTIDE (1 MG/DOSE) 4 MG/3ML ~~LOC~~ SOPN: 1.0000 mg | PEN_INJECTOR | SUBCUTANEOUS | 0 refills | Status: DC

## 2022-02-24 NOTE — Therapy (Signed)
OUTPATIENT PHYSICAL THERAPY CERVICAL TREATMENT    Patient Name: Veronica Galvan MRN: [id number] DOB:03-May-1955, 66 y.o., female Today's Date: 02/24/2022    Past Medical History:  Diagnosis Date   Allergy    Anxiety    Arthritis    hands   Cataract    Diabetes mellitus without complication ([place])    type 2   Family history of adverse reaction to anesthesia    son has malignant hyperthermia, daughter does not daughter recently had c section without problems   GERD (gastroesophageal reflux disease)    Headache    sinus   Hyperlipidemia    Hypertension    PONV (postoperative nausea and vomiting)    nausea only   Ulcer, stomach peptic yrs ago   Past Surgical History:  Procedure Laterality Date   APPENDECTOMY     both hells bone spur repair     both heels with metal clips   both shoulder rotator cuff repair     CESAREAN SECTION     x 1   COLONOSCOPY WITH PROPOFOL N/A 09/07/2016   Procedure: COLONOSCOPY WITH PROPOFOL;  Surgeon: Garlan Fair, MD;  Location: [facility];  Service: Endoscopy;  Laterality: N/A;   colonscopy  06/2011   polyps   EYE SURGERY     FRACTURE SURGERY     TUBAL LIGATION     VESICO-VAGINAL FISTULA REPAIR     Patient Active Problem List   Diagnosis Date Noted   Gastroesophageal reflux disease 11/16/2021   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 08/18/2021   Dermatofibroma 08/18/2021   History of malignant neoplasm of skin 08/18/2021   Lentigo 08/18/2021   Melanocytic nevi of trunk 08/18/2021   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   Rosacea 08/18/2021   Actinic keratosis 08/18/2021   Sensorineural hearing loss (SNHL) of left ear with unrestricted hearing of right ear 07/26/2021   Tinnitus of left ear 07/26/2021   Acute recurrent maxillary sinusitis 06/22/2021   Primary hypertension 01/12/2021   Hyperlipidemia associated with type 2 diabetes mellitus ([place]) 01/12/2021   Arthritis 01/12/2021   Type II  diabetes mellitus ([place]) 11/25/2019   Ingrown toenail 09/05/2018   Neck pain 01/29/2018   Tick bite 10/24/2017    PCP: Jodi Mourning  REFERRING PROVIDER: Genia Harold  REFERRING DIAG: M54.2  THERAPY DIAG:  Cervicalgia  Muscle weakness (generalized)  Chronic intractable headache, unspecified headache type  Rationale for Evaluation and Treatment Rehabilitation  ONSET DATE: 11/08/21  SUBJECTIVE:  SUBJECTIVE STATEMENT:  Reports that she is doing well on the days that we do the exercises and is feeling her core  when we do it. 1. Degenerative changes throughout the mid/lower cervical spine, mild to moderate in degree, as detailed above. If any associated radiculopathic symptoms, would consider nonemergent cervical spine MRI to exclude associated nerve root impingement. 2. No acute findings.    PERTINENT HISTORY:  Arthritis, HTN, bilateral rotator cuff surgery 15-54yr ago, DM  PAIN:  Are you having pain? Yes: NPRS scale: 6/10 Pain location: head and neck, shoulders  Pain description: tight  Aggravating factors: moving Relieving factors: massage, standing in the shower  WEIGHT BEARING RESTRICTIONS No  FALLS:  Has patient fallen in last 6 months? No  LIVING ENVIRONMENT: Lives with: lives alone Lives in: House/apartment Stairs: Yes: External: 3 steps; can reach both Has following equipment at home: None  OCCUPATION: reitred  PLOF: Independent  PATIENT GOALS to loosen it up, find out what is causing my headaches   OBJECTIVE:   DIAGNOSTIC FINDINGS:   FINDINGS: Brain: No acute infarct, mass effect or extra-axial collection. No acute or chronic hemorrhage. Normal white matter signal, parenchymal volume and CSF spaces. The midline structures are normal.  Skull and  upper cervical spine: Normal calvarium and skull base. Visualized upper cervical spine and soft tissues are normal.   Sinuses/Orbits:No paranasal sinus fluid levels or advanced mucosal thickening. No mastoid or middle ear effusion. Normal orbits.   IMPRESSION: Normal brain MRI.  PATIENT SURVEYS:  FOTO 53/100 , FOTO 64 (predicted level)   COGNITION: Overall cognitive status: Within functional limits for tasks assessed  SENSATION: WFL  POSTURE: rounded shoulders, forward head, and increased thoracic kyphosis  PALPATION: TTP and sore along upper trap and cervical paraspinals, trigger points in R and L UT   CERVICAL ROM:   Active ROM A/PROM (deg) eval 12/30/21 01/05/22 9/28  Flexion 100% no pain WFL WFL WNL   Extension [facility] W[facility]   Right lateral flexion Mod limitation w/pain Mild limitations Mild limitations Severe limitation, leans with trunk   Left lateral flexion Mod limitation w/pain Mod limitations Mild limitations Severe limitation, leans with trunk   Right rotation 75% w/pain Mild limitation w/pain WFL pain at end range  Mild limitation   Left rotation 75% w/pain  Mild limitation w/pain  WFL pain at end range  Mild limitation    (Blank rows = not tested)  UPPER EXTREMITY ROM: WFL, limited in IR and ER due to prior RC surgery years ago   UPPER EXTREMITY MMT:  MMT Right eval Left eval Right 01/05/22 Left 01/05/22 R 9/28 L 9/28  Shoulder flexion 4+ 4+ _0 Shoulder extension        Shoulder abduction 4+ very painful 4+ 4+ with pain _1 Shoulder adduction        Shoulder extension        Shoulder internal rotation 4+ 4+ _2 Shoulder external rotation     5 5  Middle trapezius        Lower trapezius        Elbow flexion 5 5      Elbow extension 5 5      Wrist flexion        Wrist extension        Wrist ulnar deviation        Wrist radial deviation        Wrist  pronation        Wrist supination        Grip strength          (Blank rows = not tested)   TODAY'S TREATMENT:  02/24/22 DN to the upper traps and the occipital area STM to the above and into the rhomboids and the TMJ   02/22/22 Nustep Level 6 x 6 minutes 10# straight arm pulls 10# AR press W backsLeg press 40# 2x12 25# Lats 2x12 Leg press 40# 5# marches  Feet on ball K2C, trunk rotation, small bridges, isometric abs Passive stretch LE's  02/17/22 UBE level 4 x 5 minutes Seated row 20# 2x10 Lats 20# 2x10 5# straight arm pulls with cues for core and posture 20# farmer carry one lap Weighted ball over head lift Yellow tband horizontal abduction W backs Red tband hip extension and abduction Feet on ball K2C, trunk rotation, small bridges and isometric abs Passive stretches of the LE's Shoulder and neck streches   02/15/22 DN to the upper traps and the TMJ and the occipital Occiptial release Passive stretch Some gentel PA and rotational thoracic mobs STM to the TMJ, neck and upper traps some into the rhomboids  02/10/22 Walk outdoors   NuStep L5x55mns  10# straight arm pulls with cues for posture and core 2x10  Feet on ball trunk rotation, small bridges x10 Deadbugs 2x5 20# AR press 2x10 BlackTB crunches 2x10 blackTB ext 2x10  02/08/22 STM to the neck, upper traps, rhomboids Occipital release  DN to the upper traps and the occipital area Worked on core activation, she has a very difficult time activating  02/03/22 UBE L2 x481ms  NuStep L3 x5m77m  Modified crunches on BOSU 2x10 Piriformis stretch seated with assistance 15s each side  Feet on pball trunk rotations, knees to chest, and bridge x10  Pallof press with rotation 10# 2x10     02/01/22 UBE L2 x6mi59m Thoracic ext over half foam x10 HS stretch 30s  Sk2C 30s  Bridges x10 Trunk rotations x10  Pallof press 10# 2x10 Crunches blackTB 2x10 Blacktb ext 2x10      PATIENT EDUCATION:  Education details: POC Person educated: Patient Education method:  Explanation Education comprehension: verbalized understanding   HOME EXERCISE PROGRAM: Access Code: [email or web address] URL: [email or web address] Date: 12/01/2021 Prepared by: MonaAndris BaumannSSESSMENT:  CLINICAL IMPRESSION: WE are working one day a week with strength and core activation and one day with the mm tightness, spasms and trigger points.  She has had difficulty with core activation but feeling that it is starting to happen, she also has a very difficult time relaxing   REHAB POTENTIAL: Good  CLINICAL DECISION MAKING: Stable/uncomplicated  EVALUATION COMPLEXITY: Low   GOALS: Goals reviewed with patient? Yes  SHORT TERM GOALS: Target date: 12/29/21  Patient will be independent with initial HEP.  Goal status: MET  LONG TERM GOALS: Target date: 02/26/22  Patient will be independent with advanced/ongoing HEP to improve outcomes and carryover.  Goal status: IN PROGRESS  2.  Patient will report 75% improvement in neck pain to improve QOL.   Baseline: 9/28- not even 50% improvement maybe 40% Goal status: IN PROGRESS  3.  Patient will demonstrate full pain free cervical ROM to complete ADLs and play with grandchildren with more ease. Goal status: IN PROGRESS 9/28- better but still limited (see above)  4.  Patient will demonstrate improved posture to decrease muscle imbalance and tightness.  Baseline: increased tightness along cervical paraspinals Goal status: IN  PROGRESS 9/28- ongoing impaired posture   5.  Patient will report 69 on FOTO to demonstrate improved functional ability.  Baseline:  9/28- 64  Goal status: MET  6.  Will demonstrate improved core strength as evidenced by passing double leg lower test  Baseline:  Goal status: INITIAL    PLAN: PT FREQUENCY: 2x/week  PT DURATION: 4 weeks  PLANNED INTERVENTIONS: Therapeutic exercises, Therapeutic activity, Neuromuscular re-education, Balance training, Gait training, Patient/Family education, Self  Care, Joint mobilization, Dry Needling, Spinal manipulation, Spinal mobilization, Cryotherapy, Moist heat, Taping, Vasopneumatic device, Traction, Ionotophoresis 84m/ml Dexamethasone, and Manual therapy  PLAN FOR NEXT SESSION: Renewal  MLum Babe PT 02/24/2022, 5:57 PM   [place] [address]Phone: [phone]  Fax:  [phone]

## 2022-03-01 ENCOUNTER — Encounter: Payer: Self-pay | Admitting: Physical Therapy

## 2022-03-01 ENCOUNTER — Ambulatory Visit: Payer: Medicare Other | Admitting: Physical Therapy

## 2022-03-01 DIAGNOSIS — G8929 Other chronic pain: Secondary | ICD-10-CM | POA: Diagnosis not present

## 2022-03-01 DIAGNOSIS — M6281 Muscle weakness (generalized): Secondary | ICD-10-CM | POA: Diagnosis not present

## 2022-03-01 DIAGNOSIS — R519 Headache, unspecified: Secondary | ICD-10-CM | POA: Diagnosis not present

## 2022-03-01 DIAGNOSIS — M542 Cervicalgia: Secondary | ICD-10-CM | POA: Diagnosis not present

## 2022-03-01 NOTE — Therapy (Signed)
OUTPATIENT PHYSICAL THERAPY CERVICAL TREATMENT    Patient Name: Veronica Galvan MRN: [id number] DOB:January 17, 1956, 66 y.o., female Today's Date: 03/01/2022    Past Medical History:  Diagnosis Date   Allergy    Anxiety    Arthritis    hands   Cataract    Diabetes mellitus without complication ([place])    type 2   Family history of adverse reaction to anesthesia    son has malignant hyperthermia, daughter does not daughter recently had c section without problems   GERD (gastroesophageal reflux disease)    Headache    sinus   Hyperlipidemia    Hypertension    PONV (postoperative nausea and vomiting)    nausea only   Ulcer, stomach peptic yrs ago   Past Surgical History:  Procedure Laterality Date   APPENDECTOMY     both hells bone spur repair     both heels with metal clips   both shoulder rotator cuff repair     CESAREAN SECTION     x 1   COLONOSCOPY WITH PROPOFOL N/A 09/07/2016   Procedure: COLONOSCOPY WITH PROPOFOL;  Surgeon: Garlan Fair, MD;  Location: [facility];  Service: Endoscopy;  Laterality: N/A;   colonscopy  06/2011   polyps   EYE SURGERY     FRACTURE SURGERY     TUBAL LIGATION     VESICO-VAGINAL FISTULA REPAIR     Patient Active Problem List   Diagnosis Date Noted   Gastroesophageal reflux disease 11/16/2021   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 08/18/2021   Dermatofibroma 08/18/2021   History of malignant neoplasm of skin 08/18/2021   Lentigo 08/18/2021   Melanocytic nevi of trunk 08/18/2021   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   Rosacea 08/18/2021   Actinic keratosis 08/18/2021   Sensorineural hearing loss (SNHL) of left ear with unrestricted hearing of right ear 07/26/2021   Tinnitus of left ear 07/26/2021   Acute recurrent maxillary sinusitis 06/22/2021   Primary hypertension 01/12/2021   Hyperlipidemia associated with type 2 diabetes mellitus ([place]) 01/12/2021   Arthritis 01/12/2021   Type II  diabetes mellitus ([place]) 11/25/2019   Ingrown toenail 09/05/2018   Neck pain 01/29/2018   Tick bite 10/24/2017    PCP: Jodi Mourning  REFERRING PROVIDER: Genia Harold  REFERRING DIAG: M54.2  THERAPY DIAG:  Cervicalgia  Muscle weakness (generalized)  Chronic intractable headache, unspecified headache type  Rationale for Evaluation and Treatment Rehabilitation  ONSET DATE: 11/08/21  SUBJECTIVE:  SUBJECTIVE STATEMENT:  Patient reports that with her exercises she is getting stronger and starting to feel her core, she also is reporting less intensity of HA's, reports that she continues to have pain  PERTINENT HISTORY:  Arthritis, HTN, bilateral rotator cuff surgery 15-30yr ago, DM  PAIN:  Are you having pain? Yes: NPRS scale: 6/10 Pain location: head and neck, shoulders  Pain description: tight  Aggravating factors: moving Relieving factors: massage, standing in the shower  WEIGHT BEARING RESTRICTIONS No  FALLS:  Has patient fallen in last 6 months? No  LIVING ENVIRONMENT: Lives with: lives alone Lives in: House/apartment Stairs: Yes: External: 3 steps; can reach both Has following equipment at home: None  OCCUPATION: reitred  PLOF: Independent  PATIENT GOALS to loosen it up, find out what is causing my headaches   OBJECTIVE:   DIAGNOSTIC FINDINGS:   FINDINGS: Brain: No acute infarct, mass effect or extra-axial collection. No acute or chronic hemorrhage. Normal white matter signal, parenchymal volume and CSF spaces. The midline structures are normal.  Skull and upper cervical spine: Normal calvarium and skull base. Visualized upper cervical spine and soft tissues are normal.   Sinuses/Orbits:No paranasal sinus fluid levels or advanced mucosal thickening. No  mastoid or middle ear effusion. Normal orbits.   IMPRESSION: Normal brain MRI.  PATIENT SURVEYS:  FOTO 53/100 , FOTO 64 (predicted level)   COGNITION: Overall cognitive status: Within functional limits for tasks assessed  SENSATION: WFL  POSTURE: rounded shoulders, forward head, and increased thoracic kyphosis  PALPATION: TTP and sore along upper trap and cervical paraspinals, trigger points in R and L UT   CERVICAL ROM:   Active ROM A/PROM (deg) eval 12/30/21 01/05/22 9/28 10/31  Flexion 100% no pain WFL WFL WNL    [facility]    Right lateral flexion Mod limitation w/pain Mild limitations Mild limitations Severe limitation, leans with trunk  Decreased 75%   Left lateral flexion Mod limitation w/pain Mod limitations Mild limitations Severe limitation, leans with trunk  Decreased 75%  Right rotation 75% w/pain Mild limitation w/pain WFL pain at end range  Mild limitation  Decreased 25%   Left rotation 75% w/pain  Mild limitation w/pain  WFL pain at end range  Mild limitation  Decreased 25%   (Blank rows = not tested)  UPPER EXTREMITY ROM: WFL, limited in IR and ER due to prior RC surgery years ago   UPPER EXTREMITY MMT:  MMT Right eval Left eval Right 01/05/22 Left 01/05/22 R 9/28 L 9/28  Shoulder flexion 4+ 4+ _0 Shoulder extension        Shoulder abduction 4+ very painful 4+ 4+ with pain _1 Shoulder adduction        Shoulder extension        Shoulder internal rotation 4+ 4+ _2 Shoulder external rotation     5 5  Middle trapezius        Lower trapezius        Elbow flexion 5 5      Elbow extension 5 5      Wrist flexion        Wrist extension        Wrist ulnar deviation        Wrist radial deviation        Wrist pronation        Wrist supination        Grip strength         (  Blank rows = not tested)   TODAY'S TREATMENT:  03/01/22 UBE level 4 x 6 mintues 25# rows 20# lats Black tband abs 10# leg  extension 20# leg curls 40# resisted gait all directions 10# straight arm extension core activation 15# AR press Cervical traction 14# x 10 minutes  02/24/22 DN to the upper traps and the occipital area STM to the above and into the rhomboids and the TMJ   02/22/22 Nustep Level 6 x 6 minutes 10# straight arm pulls 10# AR press W backsLeg press 40# 2x12 25# Lats 2x12 Leg press 40# 5# marches  Feet on ball K2C, trunk rotation, small bridges, isometric abs Passive stretch LE's  02/17/22 UBE level 4 x 5 minutes Seated row 20# 2x10 Lats 20# 2x10 5# straight arm pulls with cues for core and posture 20# farmer carry one lap Weighted ball over head lift Yellow tband horizontal abduction W backs Red tband hip extension and abduction Feet on ball K2C, trunk rotation, small bridges and isometric abs Passive stretches of the LE's Shoulder and neck streches   02/15/22 DN to the upper traps and the TMJ and the occipital Occiptial release Passive stretch Some gentel PA and rotational thoracic mobs STM to the TMJ, neck and upper traps some into the rhomboids  02/10/22 Walk outdoors   NuStep L5x12mns  10# straight arm pulls with cues for posture and core 2x10  Feet on ball trunk rotation, small bridges x10 Deadbugs 2x5 20# AR press 2x10 BlackTB crunches 2x10 blackTB ext 2x10  02/08/22 STM to the neck, upper traps, rhomboids Occipital release  DN to the upper traps and the occipital area Worked on core activation, she has a very difficult time activating  02/03/22 UBE L2 x417ms  NuStep L3 x5m75m  Modified crunches on BOSU 2x10 Piriformis stretch seated with assistance 15s each side  Feet on pball trunk rotations, knees to chest, and bridge x10  Pallof press with rotation 10# 2x10     02/01/22 UBE L2 x6mi85m Thoracic ext over half foam x10 HS stretch 30s  Sk2C 30s  Bridges x10 Trunk rotations x10  Pallof press 10# 2x10 Crunches blackTB 2x10 Blacktb ext 2x10       PATIENT EDUCATION:  Education details: POC Person educated: Patient Education method: Explanation Education comprehension: verbalized understanding   HOME EXERCISE PROGRAM: Access Code: [email or web address] URL: [email or web address] Date: 12/01/2021 Prepared by: MonaAndris BaumannSSESSMENT:  CLINICAL IMPRESSION: We have continued to work on the tightness and the weakness, she is progressing with less intensity of HA and feeling stronger and moving better, she still has some significant issues with both of these, a great deal of difficulty relaxing, I tried the traction but again worry about her being so gaurded  REHAB POTENTIAL: Good  CLINICAL DECISION MAKING: Stable/uncomplicated  EVALUATION COMPLEXITY: Low   GOALS: Goals reviewed with patient? Yes  SHORT TERM GOALS: Target date: 12/29/21  Patient will be independent with initial HEP.  Goal status: MET  LONG TERM GOALS: Target date: 02/26/22  Patient will be independent with advanced/ongoing HEP to improve outcomes and carryover.  Goal status: IN PROGRESS  2.  Patient will report 75% improvement in neck pain to improve QOL.   Baseline: 9/28- not even 50% improvement maybe 40% Goal status: IN PROGRESS  3.  Patient will demonstrate full pain free cervical ROM to complete ADLs and play with grandchildren with more ease. Goal status: IN PROGRESS 9/28- better but still limited (see above)  4.  Patient will  demonstrate improved posture to decrease muscle imbalance and tightness.  Baseline: increased tightness along cervical paraspinals Goal status: IN PROGRESS 9/28- ongoing impaired posture   5.  Patient will report 4 on FOTO to demonstrate improved functional ability.  Baseline:  9/28- 64  Goal status: MET  6.  Will demonstrate improved core strength as evidenced by passing double leg lower test  Baseline:  Goal status: INITIAL    PLAN: PT FREQUENCY: 2x/week  PT DURATION: 4 weeks  PLANNED  INTERVENTIONS: Therapeutic exercises, Therapeutic activity, Neuromuscular re-education, Balance training, Gait training, Patient/Family education, Self Care, Joint mobilization, Dry Needling, Spinal manipulation, Spinal mobilization, Cryotherapy, Moist heat, Taping, Vasopneumatic device, Traction, Ionotophoresis 34m/ml Dexamethasone, and Manual therapy  PLAN FOR NEXT SESSION: see how the traction does  MLum Babe PT 03/01/2022, 2:31 PM   [place] [address]Phone: [phone]  Fax:  [phone]

## 2022-03-03 ENCOUNTER — Encounter: Payer: Self-pay | Admitting: Physical Therapy

## 2022-03-03 ENCOUNTER — Ambulatory Visit: Payer: Medicare Other | Attending: Psychiatry | Admitting: Physical Therapy

## 2022-03-03 DIAGNOSIS — R519 Headache, unspecified: Secondary | ICD-10-CM | POA: Diagnosis not present

## 2022-03-03 DIAGNOSIS — G8929 Other chronic pain: Secondary | ICD-10-CM | POA: Insufficient documentation

## 2022-03-03 DIAGNOSIS — M6281 Muscle weakness (generalized): Secondary | ICD-10-CM | POA: Insufficient documentation

## 2022-03-03 DIAGNOSIS — M542 Cervicalgia: Secondary | ICD-10-CM | POA: Insufficient documentation

## 2022-03-03 NOTE — Therapy (Signed)
OUTPATIENT PHYSICAL THERAPY CERVICAL TREATMENT    Patient Name: Veronica Galvan MRN: [id number] DOB:February 11, 1956, 66 y.o., female Today's Date: 03/03/2022    Past Medical History:  Diagnosis Date   Allergy    Anxiety    Arthritis    hands   Cataract    Diabetes mellitus without complication ([place])    type 2   Family history of adverse reaction to anesthesia    son has malignant hyperthermia, daughter does not daughter recently had c section without problems   GERD (gastroesophageal reflux disease)    Headache    sinus   Hyperlipidemia    Hypertension    PONV (postoperative nausea and vomiting)    nausea only   Ulcer, stomach peptic yrs ago   Past Surgical History:  Procedure Laterality Date   APPENDECTOMY     both hells bone spur repair     both heels with metal clips   both shoulder rotator cuff repair     CESAREAN SECTION     x 1   COLONOSCOPY WITH PROPOFOL N/A 09/07/2016   Procedure: COLONOSCOPY WITH PROPOFOL;  Surgeon: Garlan Fair, MD;  Location: [facility];  Service: Endoscopy;  Laterality: N/A;   colonscopy  06/2011   polyps   EYE SURGERY     FRACTURE SURGERY     TUBAL LIGATION     VESICO-VAGINAL FISTULA REPAIR     Patient Active Problem List   Diagnosis Date Noted   Gastroesophageal reflux disease 11/16/2021   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 08/18/2021   Dermatofibroma 08/18/2021   History of malignant neoplasm of skin 08/18/2021   Lentigo 08/18/2021   Melanocytic nevi of trunk 08/18/2021   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   Rosacea 08/18/2021   Actinic keratosis 08/18/2021   Sensorineural hearing loss (SNHL) of left ear with unrestricted hearing of right ear 07/26/2021   Tinnitus of left ear 07/26/2021   Acute recurrent maxillary sinusitis 06/22/2021   Primary hypertension 01/12/2021   Hyperlipidemia associated with type 2 diabetes mellitus ([place]) 01/12/2021   Arthritis 01/12/2021   Type II  diabetes mellitus ([place]) 11/25/2019   Ingrown toenail 09/05/2018   Neck pain 01/29/2018   Tick bite 10/24/2017    PCP: Jodi Mourning  REFERRING PROVIDER: Genia Harold  REFERRING DIAG: M54.2  THERAPY DIAG:  Cervicalgia  Muscle weakness (generalized)  Chronic intractable headache, unspecified headache type  Rationale for Evaluation and Treatment Rehabilitation  ONSET DATE: 11/08/21  SUBJECTIVE:  SUBJECTIVE STATEMENT:  I have a pretty good HA right now PERTINENT HISTORY:  Arthritis, HTN, bilateral rotator cuff surgery 15-35yr ago, DM  PAIN:  Are you having pain? Yes: NPRS scale: 7/10 Pain location: head and neck, shoulders  Pain description: tight  Aggravating factors: moving Relieving factors: massage, standing in the shower  WEIGHT BEARING RESTRICTIONS No  FALLS:  Has patient fallen in last 6 months? No  LIVING ENVIRONMENT: Lives with: lives alone Lives in: House/apartment Stairs: Yes: External: 3 steps; can reach both Has following equipment at home: None  OCCUPATION: reitred  PLOF: Independent  PATIENT GOALS to loosen it up, find out what is causing my headaches   OBJECTIVE:   DIAGNOSTIC FINDINGS:   FINDINGS: Brain: No acute infarct, mass effect or extra-axial collection. No acute or chronic hemorrhage. Normal white matter signal, parenchymal volume and CSF spaces. The midline structures are normal.  Skull and upper cervical spine: Normal calvarium and skull base. Visualized upper cervical spine and soft tissues are normal.   Sinuses/Orbits:No paranasal sinus fluid levels or advanced mucosal thickening. No mastoid or middle ear effusion. Normal orbits.   IMPRESSION: Normal brain MRI.  PATIENT SURVEYS:  FOTO 53/100 , FOTO 64 (predicted  level)   COGNITION: Overall cognitive status: Within functional limits for tasks assessed  SENSATION: WFL  POSTURE: rounded shoulders, forward head, and increased thoracic kyphosis  PALPATION: TTP and sore along upper trap and cervical paraspinals, trigger points in R and L UT   CERVICAL ROM:   Active ROM A/PROM (deg) eval 12/30/21 01/05/22 9/28 10/31  Flexion 100% no pain WFL WFL WNL    [facility]    Right lateral flexion Mod limitation w/pain Mild limitations Mild limitations Severe limitation, leans with trunk  Decreased 75%   Left lateral flexion Mod limitation w/pain Mod limitations Mild limitations Severe limitation, leans with trunk  Decreased 75%  Right rotation 75% w/pain Mild limitation w/pain WFL pain at end range  Mild limitation  Decreased 25%   Left rotation 75% w/pain  Mild limitation w/pain  WFL pain at end range  Mild limitation  Decreased 25%   (Blank rows = not tested)  UPPER EXTREMITY ROM: WFL, limited in IR and ER due to prior RC surgery years ago   UPPER EXTREMITY MMT:  MMT Right eval Left eval Right 01/05/22 Left 01/05/22 R 9/28 L 9/28  Shoulder flexion 4+ 4+ _0 Shoulder extension        Shoulder abduction 4+ very painful 4+ 4+ with pain _1 Shoulder adduction        Shoulder extension        Shoulder internal rotation 4+ 4+ _2 Shoulder external rotation     5 5  Middle trapezius        Lower trapezius        Elbow flexion 5 5      Elbow extension 5 5      Wrist flexion        Wrist extension        Wrist ulnar deviation        Wrist radial deviation        Wrist pronation        Wrist supination        Grip strength         (Blank rows = not tested)   TODAY'S TREATMENT:  03/03/22 DN to the occiput and  the upper traps STM  to the above, cervical area, rhomboid, temples and TMJ  03/01/22 UBE level 4 x 6 mintues 25# rows 20# lats Black tband abs 10# leg extension 20# leg curls 40#  resisted gait all directions 10# straight arm extension core activation 15# AR press Cervical traction 14# x 10 minutes  02/24/22 DN to the upper traps and the occipital area STM to the above and into the rhomboids and the TMJ   02/22/22 Nustep Level 6 x 6 minutes 10# straight arm pulls 10# AR press W backsLeg press 40# 2x12 25# Lats 2x12 Leg press 40# 5# marches  Feet on ball K2C, trunk rotation, small bridges, isometric abs Passive stretch LE's  02/17/22 UBE level 4 x 5 minutes Seated row 20# 2x10 Lats 20# 2x10 5# straight arm pulls with cues for core and posture 20# farmer carry one lap Weighted ball over head lift Yellow tband horizontal abduction W backs Red tband hip extension and abduction Feet on ball K2C, trunk rotation, small bridges and isometric abs Passive stretches of the LE's Shoulder and neck streches   02/15/22 DN to the upper traps and the TMJ and the occipital Occiptial release Passive stretch Some gentel PA and rotational thoracic mobs STM to the TMJ, neck and upper traps some into the rhomboids  02/10/22 Walk outdoors   NuStep L5x80mns  10# straight arm pulls with cues for posture and core 2x10  Feet on ball trunk rotation, small bridges x10 Deadbugs 2x5 20# AR press 2x10 BlackTB crunches 2x10 blackTB ext 2x10  02/08/22 STM to the neck, upper traps, rhomboids Occipital release  DN to the upper traps and the occipital area Worked on core activation, she has a very difficult time activating  02/03/22 UBE L2 x438ms  NuStep L3 x5m56m  Modified crunches on BOSU 2x10 Piriformis stretch seated with assistance 15s each side  Feet on pball trunk rotations, knees to chest, and bridge x10  Pallof press with rotation 10# 2x10     02/01/22 UBE L2 x6mi8m Thoracic ext over half foam x10 HS stretch 30s  Sk2C 30s  Bridges x10 Trunk rotations x10  Pallof press 10# 2x10 Crunches blackTB 2x10 Blacktb ext 2x10      PATIENT EDUCATION:   Education details: POC Person educated: Patient Education method: Explanation Education comprehension: verbalized understanding   HOME EXERCISE PROGRAM: Access Code: [email or web address] URL: [email or web address] Date: 12/01/2021 Prepared by: MonaAndris BaumannSSESSMENT:  CLINICAL IMPRESSION: Patient came in with a HA, she is still very tight but the tope layer is more mobile there is a pretty good trigger point in the right upper trap that does cause some HA and jaw pain, I feel like it is smaller than previous weeks but still very much tender and active  REHAB POTENTIAL: Good  CLINICAL DECISION MAKING: Stable/uncomplicated  EVALUATION COMPLEXITY: Low   GOALS: Goals reviewed with patient? Yes  SHORT TERM GOALS: Target date: 12/29/21  Patient will be independent with initial HEP.  Goal status: MET  LONG TERM GOALS: Target date: 02/26/22  Patient will be independent with advanced/ongoing HEP to improve outcomes and carryover.  Goal status: IN PROGRESS  2.  Patient will report 75% improvement in neck pain to improve QOL.   Baseline: 9/28- not even 50% improvement maybe 40% Goal status: IN PROGRESS  3.  Patient will demonstrate full pain free cervical ROM to complete ADLs and play with grandchildren with more ease. Goal status: IN PROGRESS 9/28- better but still limited (see  above)  4.  Patient will demonstrate improved posture to decrease muscle imbalance and tightness.  Baseline: increased tightness along cervical paraspinals Goal status: IN PROGRESS 9/28- ongoing impaired posture   5.  Patient will report 37 on FOTO to demonstrate improved functional ability.  Baseline:  9/28- 64  Goal status: MET  6.  Will demonstrate improved core strength as evidenced by passing double leg lower test  Baseline:  Goal status: INITIAL    PLAN: PT FREQUENCY: 2x/week  PT DURATION: 4 weeks  PLANNED INTERVENTIONS: Therapeutic exercises, Therapeutic activity, Neuromuscular  re-education, Balance training, Gait training, Patient/Family education, Self Care, Joint mobilization, Dry Needling, Spinal manipulation, Spinal mobilization, Cryotherapy, Moist heat, Taping, Vasopneumatic device, Traction, Ionotophoresis 84m/ml Dexamethasone, and Manual therapy  PLAN FOR NEXT SESSION: continue to alter program to help with progression  MLum Babe PT 03/03/2022, 4:53 PM   [place] [address]Phone: [phone]  Fax:  [phone]

## 2022-03-08 ENCOUNTER — Ambulatory Visit: Payer: Medicare Other | Admitting: Physical Therapy

## 2022-03-08 ENCOUNTER — Encounter: Payer: Self-pay | Admitting: Physical Therapy

## 2022-03-08 DIAGNOSIS — R519 Headache, unspecified: Secondary | ICD-10-CM | POA: Diagnosis not present

## 2022-03-08 DIAGNOSIS — G8929 Other chronic pain: Secondary | ICD-10-CM

## 2022-03-08 DIAGNOSIS — M6281 Muscle weakness (generalized): Secondary | ICD-10-CM

## 2022-03-08 DIAGNOSIS — M542 Cervicalgia: Secondary | ICD-10-CM | POA: Diagnosis not present

## 2022-03-08 NOTE — Therapy (Signed)
OUTPATIENT PHYSICAL THERAPY CERVICAL TREATMENT    Patient Name: Veronica Galvan MRN: [id number] DOB:1955/09/17, 66 y.o., female Today's Date: 03/08/2022    Past Medical History:  Diagnosis Date   Allergy    Anxiety    Arthritis    hands   Cataract    Diabetes mellitus without complication ([place])    type 2   Family history of adverse reaction to anesthesia    son has malignant hyperthermia, daughter does not daughter recently had c section without problems   GERD (gastroesophageal reflux disease)    Headache    sinus   Hyperlipidemia    Hypertension    PONV (postoperative nausea and vomiting)    nausea only   Ulcer, stomach peptic yrs ago   Past Surgical History:  Procedure Laterality Date   APPENDECTOMY     both hells bone spur repair     both heels with metal clips   both shoulder rotator cuff repair     CESAREAN SECTION     x 1   COLONOSCOPY WITH PROPOFOL N/A 09/07/2016   Procedure: COLONOSCOPY WITH PROPOFOL;  Surgeon: Garlan Fair, MD;  Location: [facility];  Service: Endoscopy;  Laterality: N/A;   colonscopy  06/2011   polyps   EYE SURGERY     FRACTURE SURGERY     TUBAL LIGATION     VESICO-VAGINAL FISTULA REPAIR     Patient Active Problem List   Diagnosis Date Noted   Gastroesophageal reflux disease 11/16/2021   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 08/18/2021   Dermatofibroma 08/18/2021   History of malignant neoplasm of skin 08/18/2021   Lentigo 08/18/2021   Melanocytic nevi of trunk 08/18/2021   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   Rosacea 08/18/2021   Actinic keratosis 08/18/2021   Sensorineural hearing loss (SNHL) of left ear with unrestricted hearing of right ear 07/26/2021   Tinnitus of left ear 07/26/2021   Acute recurrent maxillary sinusitis 06/22/2021   Primary hypertension 01/12/2021   Hyperlipidemia associated with type 2 diabetes mellitus ([place]) 01/12/2021   Arthritis 01/12/2021   Type II  diabetes mellitus ([place]) 11/25/2019   Ingrown toenail 09/05/2018   Neck pain 01/29/2018   Tick bite 10/24/2017    PCP: Jodi Mourning  REFERRING PROVIDER: Genia Harold  REFERRING DIAG: M54.2  THERAPY DIAG:  Cervicalgia  Muscle weakness (generalized)  Chronic intractable headache, unspecified headache type  Rationale for Evaluation and Treatment Rehabilitation  ONSET DATE: 11/08/21  SUBJECTIVE:  SUBJECTIVE STATEMENT:  Reports that the last treatment really helped the HA for a few hours and then returns PERTINENT HISTORY:  Arthritis, HTN, bilateral rotator cuff surgery 15-39yr ago, DM  PAIN:  Are you having pain? Yes: NPRS scale: 6/10 Pain location: head and neck, shoulders  Pain description: tight  Aggravating factors: moving Relieving factors: massage, standing in the shower  WEIGHT BEARING RESTRICTIONS No  FALLS:  Has patient fallen in last 6 months? No  LIVING ENVIRONMENT: Lives with: lives alone Lives in: House/apartment Stairs: Yes: External: 3 steps; can reach both Has following equipment at home: None  OCCUPATION: reitred  PLOF: Independent  PATIENT GOALS to loosen it up, find out what is causing my headaches   OBJECTIVE:   DIAGNOSTIC FINDINGS:   FINDINGS: Brain: No acute infarct, mass effect or extra-axial collection. No acute or chronic hemorrhage. Normal white matter signal, parenchymal volume and CSF spaces. The midline structures are normal.  Skull and upper cervical spine: Normal calvarium and skull base. Visualized upper cervical spine and soft tissues are normal.   Sinuses/Orbits:No paranasal sinus fluid levels or advanced mucosal thickening. No mastoid or middle ear effusion. Normal orbits.   IMPRESSION: Normal brain MRI.  PATIENT  SURVEYS:  FOTO 53/100 , FOTO 64 (predicted level)   COGNITION: Overall cognitive status: Within functional limits for tasks assessed  SENSATION: WFL  POSTURE: rounded shoulders, forward head, and increased thoracic kyphosis  PALPATION: TTP and sore along upper trap and cervical paraspinals, trigger points in R and L UT   CERVICAL ROM:   Active ROM A/PROM (deg) eval 12/30/21 01/05/22 9/28 10/31  Flexion 100% no pain WFL WFL WNL    [facility]    Right lateral flexion Mod limitation w/pain Mild limitations Mild limitations Severe limitation, leans with trunk  Decreased 75%   Left lateral flexion Mod limitation w/pain Mod limitations Mild limitations Severe limitation, leans with trunk  Decreased 75%  Right rotation 75% w/pain Mild limitation w/pain WFL pain at end range  Mild limitation  Decreased 25%   Left rotation 75% w/pain  Mild limitation w/pain  WFL pain at end range  Mild limitation  Decreased 25%   (Blank rows = not tested)  UPPER EXTREMITY ROM: WFL, limited in IR and ER due to prior RC surgery years ago   UPPER EXTREMITY MMT:  MMT Right eval Left eval Right 01/05/22 Left 01/05/22 R 9/28 L 9/28  Shoulder flexion 4+ 4+ _0 Shoulder extension        Shoulder abduction 4+ very painful 4+ 4+ with pain _1 Shoulder adduction        Shoulder extension        Shoulder internal rotation 4+ 4+ _2 Shoulder external rotation     5 5  Middle trapezius        Lower trapezius        Elbow flexion 5 5      Elbow extension 5 5      Wrist flexion        Wrist extension        Wrist ulnar deviation        Wrist radial deviation        Wrist pronation        Wrist supination        Grip strength         (Blank rows = not tested)   TODAY'S  TREATMENT:  03/08/22 UBE level 4 x 6 minutes Resisted gait all motions Seated rows 20#  Lats 20# Black tband back extension Black tband abdominals 10# straight arm pulls 5# hip abduction  and extension 10# side bends W backs 50# leg press 2 x 10 Feet on ball K2C, bridges, isometric abs Partial situps  03/03/22 DN to the occiput and the upper traps STM  to the above, cervical area, rhomboid, temples and TMJ  03/01/22 UBE level 4 x 6 mintues 25# rows 20# lats Black tband abs 10# leg extension 20# leg curls 40# resisted gait all directions 10# straight arm extension core activation 15# AR press Cervical traction 14# x 10 minutes  02/24/22 DN to the upper traps and the occipital area STM to the above and into the rhomboids and the TMJ   02/22/22 Nustep Level 6 x 6 minutes 10# straight arm pulls 10# AR press W backsLeg press 40# 2x12 25# Lats 2x12 Leg press 40# 5# marches  Feet on ball K2C, trunk rotation, small bridges, isometric abs Passive stretch LE's  02/17/22 UBE level 4 x 5 minutes Seated row 20# 2x10 Lats 20# 2x10 5# straight arm pulls with cues for core and posture 20# farmer carry one lap Weighted ball over head lift Yellow tband horizontal abduction W backs Red tband hip extension and abduction Feet on ball K2C, trunk rotation, small bridges and isometric abs Passive stretches of the LE's Shoulder and neck streches      PATIENT EDUCATION:  Education details: POC Person educated: Patient Education method: Explanation Education comprehension: verbalized understanding   HOME EXERCISE PROGRAM: Access Code: [email or web address] URL: [email or web address] Date: 12/01/2021 Prepared by: Andris Baumann   ASSESSMENT:  CLINICAL IMPRESSION: Really worked core and all mms today, she did well just fatigued in the hips and the legs.  Really got some good core activation today REHAB POTENTIAL: Good  CLINICAL DECISION MAKING: Stable/uncomplicated  EVALUATION COMPLEXITY: Low   GOALS: Goals reviewed with patient? Yes  SHORT TERM GOALS: Target date: 12/29/21  Patient will be independent with initial HEP.  Goal status: MET  LONG  TERM GOALS: Target date: 02/26/22  Patient will be independent with advanced/ongoing HEP to improve outcomes and carryover.  Goal status: IN PROGRESS  2.  Patient will report 75% improvement in neck pain to improve QOL.   Baseline: 9/28- not even 50% improvement maybe 40% Goal status: IN PROGRESS  3.  Patient will demonstrate full pain free cervical ROM to complete ADLs and play with grandchildren with more ease. Goal status: IN PROGRESS 9/28- better but still limited (see above)  4.  Patient will demonstrate improved posture to decrease muscle imbalance and tightness.  Baseline: increased tightness along cervical paraspinals Goal status: IN PROGRESS 9/28- ongoing impaired posture   5.  Patient will report 50 on FOTO to demonstrate improved functional ability.  Baseline:  9/28- 64  Goal status: MET  6.  Will demonstrate improved core strength as evidenced by passing double leg lower test  Baseline:  Goal status: INITIAL    PLAN: PT FREQUENCY: 2x/week  PT DURATION: 4 weeks  PLANNED INTERVENTIONS: Therapeutic exercises, Therapeutic activity, Neuromuscular re-education, Balance training, Gait training, Patient/Family education, Self Care, Joint mobilization, Dry Needling, Spinal manipulation, Spinal mobilization, Cryotherapy, Moist heat, Taping, Vasopneumatic device, Traction, Ionotophoresis 55m/ml Dexamethasone, and Manual therapy  PLAN FOR NEXT SESSION: continue to alter program to help with progression  MLum Babe PT 03/08/2022, 9:36 AM   [phone]  [employer] [place], [place], [address] Phone: [phone]   Fax:  [phone]

## 2022-03-10 ENCOUNTER — Encounter: Payer: Self-pay | Admitting: Physical Therapy

## 2022-03-10 ENCOUNTER — Ambulatory Visit: Payer: Medicare Other | Admitting: Physical Therapy

## 2022-03-10 DIAGNOSIS — M6281 Muscle weakness (generalized): Secondary | ICD-10-CM | POA: Diagnosis not present

## 2022-03-10 DIAGNOSIS — G8929 Other chronic pain: Secondary | ICD-10-CM | POA: Diagnosis not present

## 2022-03-10 DIAGNOSIS — R519 Headache, unspecified: Secondary | ICD-10-CM | POA: Diagnosis not present

## 2022-03-10 DIAGNOSIS — M542 Cervicalgia: Secondary | ICD-10-CM | POA: Diagnosis not present

## 2022-03-10 NOTE — Therapy (Signed)
OUTPATIENT PHYSICAL THERAPY CERVICAL TREATMENT    Patient Name: Veronica Galvan MRN: [id number] DOB:1956-04-18, 66 y.o., female Today's Date: 03/10/2022    Past Medical History:  Diagnosis Date   Allergy    Anxiety    Arthritis    hands   Cataract    Diabetes mellitus without complication ([place])    type 2   Family history of adverse reaction to anesthesia    son has malignant hyperthermia, daughter does not daughter recently had c section without problems   GERD (gastroesophageal reflux disease)    Headache    sinus   Hyperlipidemia    Hypertension    PONV (postoperative nausea and vomiting)    nausea only   Ulcer, stomach peptic yrs ago   Past Surgical History:  Procedure Laterality Date   APPENDECTOMY     both hells bone spur repair     both heels with metal clips   both shoulder rotator cuff repair     CESAREAN SECTION     x 1   COLONOSCOPY WITH PROPOFOL N/A 09/07/2016   Procedure: COLONOSCOPY WITH PROPOFOL;  Surgeon: Garlan Fair, MD;  Location: [facility];  Service: Endoscopy;  Laterality: N/A;   colonscopy  06/2011   polyps   EYE SURGERY     FRACTURE SURGERY     TUBAL LIGATION     VESICO-VAGINAL FISTULA REPAIR     Patient Active Problem List   Diagnosis Date Noted   Gastroesophageal reflux disease 11/16/2021   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 08/18/2021   Dermatofibroma 08/18/2021   History of malignant neoplasm of skin 08/18/2021   Lentigo 08/18/2021   Melanocytic nevi of trunk 08/18/2021   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   Rosacea 08/18/2021   Actinic keratosis 08/18/2021   Sensorineural hearing loss (SNHL) of left ear with unrestricted hearing of right ear 07/26/2021   Tinnitus of left ear 07/26/2021   Acute recurrent maxillary sinusitis 06/22/2021   Primary hypertension 01/12/2021   Hyperlipidemia associated with type 2 diabetes mellitus ([place]) 01/12/2021   Arthritis 01/12/2021   Type II  diabetes mellitus ([place]) 11/25/2019   Ingrown toenail 09/05/2018   Neck pain 01/29/2018   Tick bite 10/24/2017    PCP: Jodi Mourning  REFERRING PROVIDER: Genia Harold  REFERRING DIAG: M54.2  THERAPY DIAG:  Cervicalgia  Muscle weakness (generalized)  Chronic intractable headache, unspecified headache type  Rationale for Evaluation and Treatment Rehabilitation  ONSET DATE: 11/08/21  SUBJECTIVE:  SUBJECTIVE STATEMENT:  I feel like I move better with neck and arms after PT  PERTINENT HISTORY:  Arthritis, HTN, bilateral rotator cuff surgery 15-96yr ago, DM  PAIN:  Are you having pain? Yes: NPRS scale: 6/10 Pain location: head and neck, shoulders  Pain description: tight  Aggravating factors: moving Relieving factors: massage, standing in the shower  WEIGHT BEARING RESTRICTIONS No  FALLS:  Has patient fallen in last 6 months? No  LIVING ENVIRONMENT: Lives with: lives alone Lives in: House/apartment Stairs: Yes: External: 3 steps; can reach both Has following equipment at home: None  OCCUPATION: reitred  PLOF: Independent  PATIENT GOALS to loosen it up, find out what is causing my headaches   OBJECTIVE:   DIAGNOSTIC FINDINGS:   FINDINGS: Brain: No acute infarct, mass effect or extra-axial collection. No acute or chronic hemorrhage. Normal white matter signal, parenchymal volume and CSF spaces. The midline structures are normal.  Skull and upper cervical spine: Normal calvarium and skull base. Visualized upper cervical spine and soft tissues are normal.   Sinuses/Orbits:No paranasal sinus fluid levels or advanced mucosal thickening. No mastoid or middle ear effusion. Normal orbits.   IMPRESSION: Normal brain MRI.  PATIENT SURVEYS:  FOTO 53/100 , FOTO 64  (predicted level)   COGNITION: Overall cognitive status: Within functional limits for tasks assessed  SENSATION: WFL  POSTURE: rounded shoulders, forward head, and increased thoracic kyphosis  PALPATION: TTP and sore along upper trap and cervical paraspinals, trigger points in R and L UT   CERVICAL ROM:   Active ROM A/PROM (deg) eval 12/30/21 01/05/22 9/28 10/31  Flexion 100% no pain WFL WFL WNL    Extension [facility] limitations [facility] W[facility]    Right lateral flexion Mod limitation w/pain Mild limitations Mild limitations Severe limitation, leans with trunk  Decreased 75%   Left lateral flexion Mod limitation w/pain Mod limitations Mild limitations Severe limitation, leans with trunk  Decreased 75%  Right rotation 75% w/pain Mild limitation w/pain WFL pain at end range  Mild limitation  Decreased 25%   Left rotation 75% w/pain  Mild limitation w/pain  WFL pain at end range  Mild limitation  Decreased 25%   (Blank rows = not tested)  UPPER EXTREMITY ROM: WFL, limited in IR and ER due to prior RC surgery years ago   UPPER EXTREMITY MMT:  MMT Right eval Left eval Right 01/05/22 Left 01/05/22 R 9/28 L 9/28  Shoulder flexion 4+ 4+ _0 Shoulder extension        Shoulder abduction 4+ very painful 4+ 4+ with pain _1 Shoulder adduction        Shoulder extension        Shoulder internal rotation 4+ 4+ _2 Shoulder external rotation     5 5  Middle trapezius        Lower trapezius        Elbow flexion 5 5      Elbow extension 5 5      Wrist flexion        Wrist extension        Wrist ulnar deviation        Wrist radial deviation        Wrist pronation        Wrist supination        Grip strength         (Blank rows = not tested)   TODAY'S TREATMENT:  03/10/22  DN to the occipital area, the rhomboid, upper trap and TMJ STM to above Cervical traction 16# x 10 minutes  03/08/22 UBE level 4 x 6 minutes Resisted gait all motions Seated rows 20#  Lats  20# Black tband back extension Black tband abdominals 10# straight arm pulls 5# hip abduction and extension 10# side bends W backs 50# leg press 2 x 10 Feet on ball K2C, bridges, isometric abs Partial situps  03/03/22 DN to the occiput and the upper traps STM  to the above, cervical area, rhomboid, temples and TMJ  03/01/22 UBE level 4 x 6 mintues 25# rows 20# lats Black tband abs 10# leg extension 20# leg curls 40# resisted gait all directions 10# straight arm extension core activation 15# AR press Cervical traction 14# x 10 minutes  02/24/22 DN to the upper traps and the occipital area STM to the above and into the rhomboids and the TMJ   02/22/22 Nustep Level 6 x 6 minutes 10# straight arm pulls 10# AR press W backsLeg press 40# 2x12 25# Lats 2x12 Leg press 40# 5# marches  Feet on ball K2C, trunk rotation, small bridges, isometric abs Passive stretch LE's  02/17/22 UBE level 4 x 5 minutes Seated row 20# 2x10 Lats 20# 2x10 5# straight arm pulls with cues for core and posture 20# farmer carry one lap Weighted ball over head lift Yellow tband horizontal abduction W backs Red tband hip extension and abduction Feet on ball K2C, trunk rotation, small bridges and isometric abs Passive stretches of the LE's Shoulder and neck streches      PATIENT EDUCATION:  Education details: POC Person educated: Patient Education method: Explanation Education comprehension: verbalized understanding   HOME EXERCISE PROGRAM: Access Code: [email or web address] URL: [email or web address] Date: 12/01/2021 Prepared by: Andris Baumann   ASSESSMENT:  CLINICAL IMPRESSION: We continue to alternate b/n working on the tightness and strength and ROM, I did do traction again today, she is able to demonstrate better arm motions reaching behind back and some increase in cervical ROM REHAB POTENTIAL: Good  CLINICAL DECISION MAKING: Stable/uncomplicated  EVALUATION COMPLEXITY:  Low   GOALS: Goals reviewed with patient? Yes  SHORT TERM GOALS: Target date: 12/29/21  Patient will be independent with initial HEP.  Goal status: MET  LONG TERM GOALS: Target date: 02/26/22  Patient will be independent with advanced/ongoing HEP to improve outcomes and carryover.  Goal status: IN PROGRESS  2.  Patient will report 75% improvement in neck pain to improve QOL.   Baseline: 9/28- not even 50% improvement maybe 40% Goal status: IN PROGRESS  3.  Patient will demonstrate full pain free cervical ROM to complete ADLs and play with grandchildren with more ease. Goal status: IN PROGRESS 9/28- better but still limited (see above)  4.  Patient will demonstrate improved posture to decrease muscle imbalance and tightness.  Baseline: increased tightness along cervical paraspinals Goal status: IN PROGRESS 9/28- ongoing impaired posture   5.  Patient will report 43 on FOTO to demonstrate improved functional ability.  Baseline:  9/28- 64  Goal status: MET  6.  Will demonstrate improved core strength as evidenced by passing double leg lower test  Baseline:  Goal status: INITIAL    PLAN: PT FREQUENCY: 2x/week  PT DURATION: 4 weeks  PLANNED INTERVENTIONS: Therapeutic exercises, Therapeutic activity, Neuromuscular re-education, Balance training, Gait training, Patient/Family education, Self Care, Joint mobilization, Dry Needling, Spinal manipulation, Spinal mobilization, Cryotherapy, Moist heat, Taping, Vasopneumatic device, Traction, Ionotophoresis 39m/ml Dexamethasone, and Manual therapy  PLAN  FOR NEXT SESSION: continue to alter program to help with progression  Lum Babe, PT 03/10/2022, 10:09 AM   [place]. [place], [place], [address] Phone: [phone]   Fax:  [phone]

## 2022-03-15 ENCOUNTER — Ambulatory Visit: Payer: Medicare Other | Admitting: Physical Therapy

## 2022-03-15 ENCOUNTER — Encounter: Payer: Self-pay | Admitting: Physical Therapy

## 2022-03-15 DIAGNOSIS — G8929 Other chronic pain: Secondary | ICD-10-CM | POA: Diagnosis not present

## 2022-03-15 DIAGNOSIS — M6281 Muscle weakness (generalized): Secondary | ICD-10-CM

## 2022-03-15 DIAGNOSIS — R519 Headache, unspecified: Secondary | ICD-10-CM | POA: Diagnosis not present

## 2022-03-15 DIAGNOSIS — M542 Cervicalgia: Secondary | ICD-10-CM | POA: Diagnosis not present

## 2022-03-15 NOTE — Therapy (Signed)
OUTPATIENT PHYSICAL THERAPY CERVICAL TREATMENT  Progress Note Reporting Period 02/10/22 to 03/15/22 for visits 21-30  See note below for Objective Data and Assessment of Progress/Goals.      Patient Name: Veronica Galvan MRN: [id number] DOB:[birth year], 66 y.o., female Today's Date: 03/15/2022    Past Medical History:  Diagnosis Date   Allergy    Anxiety    Arthritis    hands   Cataract    Diabetes mellitus without complication ([place])    type 2   Family history of adverse reaction to anesthesia    son has malignant hyperthermia, daughter does not daughter recently had c section without problems   GERD (gastroesophageal reflux disease)    Headache    sinus   Hyperlipidemia    Hypertension    PONV (postoperative nausea and vomiting)    nausea only   Ulcer, stomach peptic yrs ago   Past Surgical History:  Procedure Laterality Date   APPENDECTOMY     both hells bone spur repair     both heels with metal clips   both shoulder rotator cuff repair     CESAREAN SECTION     x 1   COLONOSCOPY WITH PROPOFOL N/A 09/07/2016   Procedure: COLONOSCOPY WITH PROPOFOL;  Surgeon: Garlan Fair, MD;  Location: [facility];  Service: Endoscopy;  Laterality: N/A;   colonscopy  06/2011   polyps   EYE SURGERY     FRACTURE SURGERY     TUBAL LIGATION     VESICO-VAGINAL FISTULA REPAIR     Patient Active Problem List   Diagnosis Date Noted   Gastroesophageal reflux disease 11/16/2021   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 08/18/2021   Dermatofibroma 08/18/2021   History of malignant neoplasm of skin 08/18/2021   Lentigo 08/18/2021   Melanocytic nevi of trunk 08/18/2021   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   Rosacea 08/18/2021   Actinic keratosis 08/18/2021   Sensorineural hearing loss (SNHL) of left ear with unrestricted hearing of right ear 07/26/2021   Tinnitus of left ear 07/26/2021   Acute recurrent maxillary sinusitis 06/22/2021    Primary hypertension 01/12/2021   Hyperlipidemia associated with type 2 diabetes mellitus ([place]) 01/12/2021   Arthritis 01/12/2021   Type II diabetes mellitus ([place]) 11/25/2019   Ingrown toenail 09/05/2018   Neck pain 01/29/2018   Tick bite 10/24/2017    PCP: Jodi Mourning  REFERRING PROVIDER: Genia Harold  REFERRING DIAG: M54.2  THERAPY DIAG:  Cervicalgia  Muscle weakness (generalized)  Chronic intractable headache, unspecified headache type  Rationale for Evaluation and Treatment Rehabilitation  ONSET DATE: 11/08/21  SUBJECTIVE:  SUBJECTIVE STATEMENT:  I am really sore, I have been baby sitting and very active, HA, hands hurt and shoulders hurt.  PERTINENT HISTORY:  Arthritis, HTN, bilateral rotator cuff surgery 15-10yr ago, DM  PAIN:  Are you having pain? Yes: NPRS scale: 8/10 Pain location: head and neck, shoulders  Pain description: tight  Aggravating factors: moving Relieving factors: massage, standing in the shower  WEIGHT BEARING RESTRICTIONS No  FALLS:  Has patient fallen in last 6 months? No  LIVING ENVIRONMENT: Lives with: lives alone Lives in: House/apartment Stairs: Yes: External: 3 steps; can reach both Has following equipment at home: None  OCCUPATION: reitred  PLOF: Independent  PATIENT GOALS to loosen it up, find out what is causing my headaches   OBJECTIVE:   DIAGNOSTIC FINDINGS:   FINDINGS: Brain: No acute infarct, mass effect or extra-axial collection. No acute or chronic hemorrhage. Normal white matter signal, parenchymal volume and CSF spaces. The midline structures are normal.  Skull and upper cervical spine: Normal calvarium and skull base. Visualized upper cervical spine and soft tissues are normal.   Sinuses/Orbits:No paranasal  sinus fluid levels or advanced mucosal thickening. No mastoid or middle ear effusion. Normal orbits.   IMPRESSION: Normal brain MRI.  PATIENT SURVEYS:  FOTO 53/100 , FOTO 64 (predicted level)   COGNITION: Overall cognitive status: Within functional limits for tasks assessed  SENSATION: WFL  POSTURE: rounded shoulders, forward head, and increased thoracic kyphosis  PALPATION: TTP and sore along upper trap and cervical paraspinals, trigger points in R and L UT   CERVICAL ROM:   Active ROM A/PROM (deg) eval 12/30/21 01/05/22 9/28 10/31  Flexion 100% no pain WFL WFL WNL    Extension Mild limitations w/[facility] WFL WNL    Right lateral flexion Mod limitation w/pain Mild limitations Mild limitations Severe limitation, leans with trunk  Decreased 75%   Left lateral flexion Mod limitation w/pain Mod limitations Mild limitations Severe limitation, leans with trunk  Decreased 75%  Right rotation 75% w/pain Mild limitation w/pain WFL pain at end range  Mild limitation  Decreased 25%   Left rotation 75% w/pain  Mild limitation w/pain  WFL pain at end range  Mild limitation  Decreased 25%   (Blank rows = not tested)  UPPER EXTREMITY ROM: WFL, limited in IR and ER due to prior RC surgery years ago   UPPER EXTREMITY MMT:  MMT Right eval Left eval Right 01/05/22 Left 01/05/22 R 9/28 L 9/28  Shoulder flexion 4+ 4+ _0 Shoulder extension        Shoulder abduction 4+ very painful 4+ 4+ with pain _1 Shoulder adduction        Shoulder extension        Shoulder internal rotation 4+ 4+ _2 Shoulder external rotation     5 5  Middle trapezius        Lower trapezius        Elbow flexion 5 5      Elbow extension 5 5      Wrist flexion        Wrist extension        Wrist ulnar deviation        Wrist radial deviation        Wrist pronation        Wrist supination        Grip strength         (Blank rows = not tested)  TODAY'S TREATMENT:  03/15/22 Seated row 20# Lats  20# Black tband extension 2# cuff weights flexion and abduction shoulders Feet on ball K2C, trunk rotation, bridge and isometric abs Dead bug with ball Clamshell with bridge Ball squeeze with bridge  03/10/22 DN to the occipital area, the rhomboid, upper trap and TMJ STM to above Cervical traction 16# x 10 minutes  03/08/22 UBE level 4 x 6 minutes Resisted gait all motions Seated rows 20#  Lats 20# Black tband back extension Black tband abdominals 10# straight arm pulls 5# hip abduction and extension 10# side bends W backs 50# leg press 2 x 10 Feet on ball K2C, bridges, isometric abs Partial situps  03/03/22 DN to the occiput and the upper traps STM  to the above, cervical area, rhomboid, temples and TMJ  03/01/22 UBE level 4 x 6 mintues 25# rows 20# lats Black tband abs 10# leg extension 20# leg curls 40# resisted gait all directions 10# straight arm extension core activation 15# AR press Cervical traction 14# x 10 minutes  02/24/22 DN to the upper traps and the occipital area STM to the above and into the rhomboids and the TMJ   02/22/22 Nustep Level 6 x 6 minutes 10# straight arm pulls 10# AR press W backsLeg press 40# 2x12 25# Lats 2x12 Leg press 40# 5# marches  Feet on ball K2C, trunk rotation, small bridges, isometric abs Passive stretch LE's  02/17/22 UBE level 4 x 5 minutes Seated row 20# 2x10 Lats 20# 2x10 5# straight arm pulls with cues for core and posture 20# farmer carry one lap Weighted ball over head lift Yellow tband horizontal abduction W backs Red tband hip extension and abduction Feet on ball K2C, trunk rotation, small bridges and isometric abs Passive stretches of the LE's Shoulder and neck streches      PATIENT EDUCATION:  Education details: POC Person educated: Patient Education method: Explanation Education comprehension: verbalized understanding   HOME EXERCISE PROGRAM: Access Code: [email or web address] URL:  [email or web address] Date: 12/01/2021 Prepared by: Andris Baumann   ASSESSMENT:  CLINICAL IMPRESSION: We continue to alternate b/n working on the tightness and strength and ROM, she continues to c/o HA and tightness but feels that it is getting better, she was very active this weekend and is hurting a little more with the HA and the upper traps.  REHAB POTENTIAL: Good  CLINICAL DECISION MAKING: Stable/uncomplicated  EVALUATION COMPLEXITY: Low   GOALS: Goals reviewed with patient? Yes  SHORT TERM GOALS: Target date: 12/29/21  Patient will be independent with initial HEP.  Goal status: MET  LONG TERM GOALS: Target date: 02/26/22  Patient will be independent with advanced/ongoing HEP to improve outcomes and carryover.  Goal status: IN PROGRESS  2.  Patient will report 75% improvement in neck pain to improve QOL.   Baseline: 9/28- not even 50% improvement maybe 40% Goal status: IN PROGRESS  3.  Patient will demonstrate full pain free cervical ROM to complete ADLs and play with grandchildren with more ease. Goal status: IN PROGRESS 9/28- better but still limited (see above)  4.  Patient will demonstrate improved posture to decrease muscle imbalance and tightness.  Baseline: increased tightness along cervical paraspinals Goal status: IN PROGRESS 9/28- ongoing impaired posture   5.  Patient will report 23 on FOTO to demonstrate improved functional ability.  Baseline:  9/28- 64  Goal status: MET  6.  Will demonstrate improved core strength as evidenced by passing double leg lower test  Baseline:  Goal status:  INITIAL    PLAN: PT FREQUENCY: 2x/week  PT DURATION: 4 weeks  PLANNED INTERVENTIONS: Therapeutic exercises, Therapeutic activity, Neuromuscular re-education, Balance training, Gait training, Patient/Family education, Self Care, Joint mobilization, Dry Needling, Spinal manipulation, Spinal mobilization, Cryotherapy, Moist heat, Taping, Vasopneumatic  device, Traction, Ionotophoresis 69m/ml Dexamethasone, and Manual therapy  PLAN FOR NEXT SESSION: continue to alter program to help with progression  MLum Babe PT 03/15/2022, 9:33 AM   [place] [address]Phone: [phone]  Fax:  [phone]

## 2022-03-17 ENCOUNTER — Encounter: Payer: Self-pay | Admitting: Physical Therapy

## 2022-03-17 ENCOUNTER — Ambulatory Visit: Payer: Medicare Other | Admitting: Physical Therapy

## 2022-03-17 DIAGNOSIS — G8929 Other chronic pain: Secondary | ICD-10-CM

## 2022-03-17 DIAGNOSIS — M542 Cervicalgia: Secondary | ICD-10-CM | POA: Diagnosis not present

## 2022-03-17 DIAGNOSIS — R519 Headache, unspecified: Secondary | ICD-10-CM | POA: Diagnosis not present

## 2022-03-17 DIAGNOSIS — M6281 Muscle weakness (generalized): Secondary | ICD-10-CM | POA: Diagnosis not present

## 2022-03-17 NOTE — Therapy (Signed)
OUTPATIENT PHYSICAL THERAPY CERVICAL TREATMENT   Patient Name: Veronica Galvan MRN: [id number] DOB:[birth year], 66 y.o., female Today's Date: 03/17/2022    Past Medical History:  Diagnosis Date   Allergy    Anxiety    Arthritis    hands   Cataract    Diabetes mellitus without complication ([place])    type 2   Family history of adverse reaction to anesthesia    son has malignant hyperthermia, daughter does not daughter recently had c section without problems   GERD (gastroesophageal reflux disease)    Headache    sinus   Hyperlipidemia    Hypertension    PONV (postoperative nausea and vomiting)    nausea only   Ulcer, stomach peptic yrs ago   Past Surgical History:  Procedure Laterality Date   APPENDECTOMY     both hells bone spur repair     both heels with metal clips   both shoulder rotator cuff repair     CESAREAN SECTION     x 1   COLONOSCOPY WITH PROPOFOL N/A 09/07/2016   Procedure: COLONOSCOPY WITH PROPOFOL;  Surgeon: Garlan Fair, MD;  Location: [facility];  Service: Endoscopy;  Laterality: N/A;   colonscopy  06/2011   polyps   EYE SURGERY     FRACTURE SURGERY     TUBAL LIGATION     VESICO-VAGINAL FISTULA REPAIR     Patient Active Problem List   Diagnosis Date Noted   Gastroesophageal reflux disease 11/16/2021   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 08/18/2021   Dermatofibroma 08/18/2021   History of malignant neoplasm of skin 08/18/2021   Lentigo 08/18/2021   Melanocytic nevi of trunk 08/18/2021   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   Rosacea 08/18/2021   Actinic keratosis 08/18/2021   Sensorineural hearing loss (SNHL) of left ear with unrestricted hearing of right ear 07/26/2021   Tinnitus of left ear 07/26/2021   Acute recurrent maxillary sinusitis 06/22/2021   Primary hypertension 01/12/2021   Hyperlipidemia associated with type 2 diabetes mellitus ([place]) 01/12/2021   Arthritis 01/12/2021   Type II  diabetes mellitus ([place]) 11/25/2019   Ingrown toenail 09/05/2018   Neck pain 01/29/2018   Tick bite 10/24/2017    PCP: Jodi Mourning  REFERRING PROVIDER: Genia Harold  REFERRING DIAG: M54.2  THERAPY DIAG:  Cervicalgia  Muscle weakness (generalized)  Chronic intractable headache, unspecified headache type  Rationale for Evaluation and Treatment Rehabilitation  ONSET DATE: 11/08/21  SUBJECTIVE:  SUBJECTIVE STATEMENT:  Patient very tight and sore with increased pain, she was at [facility] and has cooked and carried 10 gallons of soup   PERTINENT HISTORY:  Arthritis, HTN, bilateral rotator cuff surgery 15-34yr ago, DM  PAIN:  Are you having pain? Yes: NPRS scale: 8/10 Pain location: head and neck, shoulders  Pain description: tight  Aggravating factors: moving Relieving factors: massage, standing in the shower  WEIGHT BEARING RESTRICTIONS No  FALLS:  Has patient fallen in last 6 months? No  LIVING ENVIRONMENT: Lives with: lives alone Lives in: House/apartment Stairs: Yes: External: 3 steps; can reach both Has following equipment at home: None  OCCUPATION: reitred  PLOF: Independent  PATIENT GOALS to loosen it up, find out what is causing my headaches   OBJECTIVE:   DIAGNOSTIC FINDINGS:   FINDINGS: Brain: No acute infarct, mass effect or extra-axial collection. No acute or chronic hemorrhage. Normal white matter signal, parenchymal volume and CSF spaces. The midline structures are normal.  Skull and upper cervical spine: Normal calvarium and skull base. Visualized upper cervical spine and soft tissues are normal.   Sinuses/Orbits:No paranasal sinus fluid levels or advanced mucosal thickening. No mastoid or middle ear effusion. Normal orbits.    IMPRESSION: Normal brain MRI.  PATIENT SURVEYS:  FOTO 53/100 , FOTO 64 (predicted level)   COGNITION: Overall cognitive status: Within functional limits for tasks assessed  SENSATION: WFL  POSTURE: rounded shoulders, forward head, and increased thoracic kyphosis  PALPATION: TTP and sore along upper trap and cervical paraspinals, trigger points in R and L UT   CERVICAL ROM:   Active ROM A/PROM (deg) eval 12/30/21 01/05/22 9/28 10/31  Flexion 100% no pain WFL WFL WNL    Extension Mild limitations w/[facility] WFL WNL    Right lateral flexion Mod limitation w/pain Mild limitations Mild limitations Severe limitation, leans with trunk  Decreased 75%   Left lateral flexion Mod limitation w/pain Mod limitations Mild limitations Severe limitation, leans with trunk  Decreased 75%  Right rotation 75% w/pain Mild limitation w/pain WFL pain at end range  Mild limitation  Decreased 25%   Left rotation 75% w/pain  Mild limitation w/pain  WFL pain at end range  Mild limitation  Decreased 25%   (Blank rows = not tested)  UPPER EXTREMITY ROM: WFL, limited in IR and ER due to prior RC surgery years ago   UPPER EXTREMITY MMT:  MMT Right eval Left eval Right 01/05/22 Left 01/05/22 R 9/28 L 9/28  Shoulder flexion 4+ 4+ _0 Shoulder extension        Shoulder abduction 4+ very painful 4+ 4+ with pain _1 Shoulder adduction        Shoulder extension        Shoulder internal rotation 4+ 4+ _2 Shoulder external rotation     5 5  Middle trapezius        Lower trapezius        Elbow flexion 5 5      Elbow extension 5 5      Wrist flexion        Wrist extension        Wrist ulnar deviation        Wrist radial deviation        Wrist pronation        Wrist supination        Grip strength         (  Blank rows = not tested)   TODAY'S TREATMENT:  03/17/22 DN to the upper traps and the cervical area STM to the upper traps, rhomboids, cervical area Thoracic mobilizations Some  gentle contract relax for cervical ROM  03/15/22 Seated row 20# Lats 20# Black tband extension 2# cuff weights flexion and abduction shoulders Feet on ball K2C, trunk rotation, bridge and isometric abs Dead bug with ball Clamshell with bridge Ball squeeze with bridge  03/10/22 DN to the occipital area, the rhomboid, upper trap and TMJ STM to above Cervical traction 16# x 10 minutes  03/08/22 UBE level 4 x 6 minutes Resisted gait all motions Seated rows 20#  Lats 20# Black tband back extension Black tband abdominals 10# straight arm pulls 5# hip abduction and extension 10# side bends W backs 50# leg press 2 x 10 Feet on ball K2C, bridges, isometric abs Partial situps  03/03/22 DN to the occiput and the upper traps STM  to the above, cervical area, rhomboid, temples and TMJ  03/01/22 UBE level 4 x 6 mintues 25# rows 20# lats Black tband abs 10# leg extension 20# leg curls 40# resisted gait all directions 10# straight arm extension core activation 15# AR press Cervical traction 14# x 10 minutes  02/24/22 DN to the upper traps and the occipital area STM to the above and into the rhomboids and the TMJ   02/22/22 Nustep Level 6 x 6 minutes 10# straight arm pulls 10# AR press W backsLeg press 40# 2x12 25# Lats 2x12 Leg press 40# 5# marches  Feet on ball K2C, trunk rotation, small bridges, isometric abs Passive stretch LE's  02/17/22 UBE level 4 x 5 minutes Seated row 20# 2x10 Lats 20# 2x10 5# straight arm pulls with cues for core and posture 20# farmer carry one lap Weighted ball over head lift Yellow tband horizontal abduction W backs Red tband hip extension and abduction Feet on ball K2C, trunk rotation, small bridges and isometric abs Passive stretches of the LE's Shoulder and neck streches      PATIENT EDUCATION:  Education details: POC Person educated: Patient Education method: Explanation Education comprehension: verbalized  understanding   HOME EXERCISE PROGRAM: Access Code: [email or web address] URL: [email or web address] Date: 12/01/2021 Prepared by: Andris Baumann   ASSESSMENT:  CLINICAL IMPRESSION: Patient worse over the past week with much increased activity with baby sitting and with cooking for [employer], she was very tight and sore today and more into the rhomboids  REHAB POTENTIAL: Good  CLINICAL DECISION MAKING: Stable/uncomplicated  EVALUATION COMPLEXITY: Low   GOALS: Goals reviewed with patient? Yes  SHORT TERM GOALS: Target date: 12/29/21  Patient will be independent with initial HEP.  Goal status: MET  LONG TERM GOALS: Target date: 02/26/22  Patient will be independent with advanced/ongoing HEP to improve outcomes and carryover.  Goal status: IN PROGRESS  2.  Patient will report 75% improvement in neck pain to improve QOL.   Baseline: 9/28- not even 50% improvement maybe 40% Goal status: IN PROGRESS  3.  Patient will demonstrate full pain free cervical ROM to complete ADLs and play with grandchildren with more ease. Goal status: IN PROGRESS 9/28- better but still limited (see above)  4.  Patient will demonstrate improved posture to decrease muscle imbalance and tightness.  Baseline: increased tightness along cervical paraspinals Goal status: IN PROGRESS 9/28- ongoing impaired posture   5.  Patient will report 18 on FOTO to demonstrate improved functional ability.  Baseline:  9/28- 64  Goal status: MET  6.  Will demonstrate improved core strength as evidenced by passing double leg lower test  Baseline:  Goal status: INITIAL    PLAN: PT FREQUENCY: 2x/week  PT DURATION: 4 weeks  PLANNED INTERVENTIONS: Therapeutic exercises, Therapeutic activity, Neuromuscular re-education, Balance training, Gait training, Patient/Family education, Self Care, Joint mobilization, Dry Needling, Spinal manipulation, Spinal mobilization, Cryotherapy, Moist heat, Taping, Vasopneumatic  device, Traction, Ionotophoresis 54m/ml Dexamethasone, and Manual therapy  PLAN FOR NEXT SESSION: continue to alter program frpm strength and core activation to working on the spasms and trigger points to help with progression  MLum Babe PT 03/17/2022, 2:35 PM   [place] [address]Phone: [phone]  Fax:  [phone]

## 2022-03-29 ENCOUNTER — Ambulatory Visit: Payer: Medicare Other | Admitting: Physical Therapy

## 2022-03-29 ENCOUNTER — Encounter: Payer: Self-pay | Admitting: Physical Therapy

## 2022-03-29 DIAGNOSIS — M542 Cervicalgia: Secondary | ICD-10-CM | POA: Diagnosis not present

## 2022-03-29 DIAGNOSIS — R519 Headache, unspecified: Secondary | ICD-10-CM | POA: Diagnosis not present

## 2022-03-29 DIAGNOSIS — M6281 Muscle weakness (generalized): Secondary | ICD-10-CM

## 2022-03-29 DIAGNOSIS — G8929 Other chronic pain: Secondary | ICD-10-CM | POA: Diagnosis not present

## 2022-03-29 NOTE — Therapy (Signed)
OUTPATIENT PHYSICAL THERAPY CERVICAL TREATMENT   Patient Name: Veronica Galvan MRN: [id number] DOB:Aug 05, 1955, 66 y.o., female Today's Date: 03/29/2022    Past Medical History:  Diagnosis Date   Allergy    Anxiety    Arthritis    hands   Cataract    Diabetes mellitus without complication ([place])    type 2   Family history of adverse reaction to anesthesia    son has malignant hyperthermia, daughter does not daughter recently had c section without problems   GERD (gastroesophageal reflux disease)    Headache    sinus   Hyperlipidemia    Hypertension    PONV (postoperative nausea and vomiting)    nausea only   Ulcer, stomach peptic yrs ago   Past Surgical History:  Procedure Laterality Date   APPENDECTOMY     both hells bone spur repair     both heels with metal clips   both shoulder rotator cuff repair     CESAREAN SECTION     x 1   COLONOSCOPY WITH PROPOFOL N/A 09/07/2016   Procedure: COLONOSCOPY WITH PROPOFOL;  Surgeon: Garlan Fair, MD;  Location: [facility];  Service: Endoscopy;  Laterality: N/A;   colonscopy  06/2011   polyps   EYE SURGERY     FRACTURE SURGERY     TUBAL LIGATION     VESICO-VAGINAL FISTULA REPAIR     Patient Active Problem List   Diagnosis Date Noted   Gastroesophageal reflux disease 11/16/2021   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 08/18/2021   Dermatofibroma 08/18/2021   History of malignant neoplasm of skin 08/18/2021   Lentigo 08/18/2021   Melanocytic nevi of trunk 08/18/2021   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   Rosacea 08/18/2021   Actinic keratosis 08/18/2021   Sensorineural hearing loss (SNHL) of left ear with unrestricted hearing of right ear 07/26/2021   Tinnitus of left ear 07/26/2021   Acute recurrent maxillary sinusitis 06/22/2021   Primary hypertension 01/12/2021   Hyperlipidemia associated with type 2 diabetes mellitus ([place]) 01/12/2021   Arthritis 01/12/2021   Type II  diabetes mellitus ([place]) 11/25/2019   Ingrown toenail 09/05/2018   Neck pain 01/29/2018   Tick bite 10/24/2017    PCP: Jodi Mourning  REFERRING PROVIDER: Genia Harold  REFERRING DIAG: M54.2  THERAPY DIAG:  Cervicalgia  Muscle weakness (generalized)  Chronic intractable headache, unspecified headache type  Rationale for Evaluation and Treatment Rehabilitation  ONSET DATE: 11/08/21  SUBJECTIVE:  SUBJECTIVE STATEMENT:  Patient reports that she is surprised that she was able to get through Thanksgiving without much difficulty, "I was pleased that my stamina and pain was not bad with all of the cooking" PERTINENT HISTORY:  Arthritis, HTN, bilateral rotator cuff surgery 15-21yr ago, DM  PAIN:  Are you having pain? Yes: NPRS scale: 7/10 Pain location: head and neck, shoulders  Pain description: tight  Aggravating factors: moving Relieving factors: massage, standing in the shower  WEIGHT BEARING RESTRICTIONS No  FALLS:  Has patient fallen in last 6 months? No  LIVING ENVIRONMENT: Lives with: lives alone Lives in: House/apartment Stairs: Yes: External: 3 steps; can reach both Has following equipment at home: None  OCCUPATION: reitred  PLOF: Independent  PATIENT GOALS to loosen it up, find out what is causing my headaches   OBJECTIVE:   DIAGNOSTIC FINDINGS:   FINDINGS: Brain: No acute infarct, mass effect or extra-axial collection. No acute or chronic hemorrhage. Normal white matter signal, parenchymal volume and CSF spaces. The midline structures are normal.  Skull and upper cervical spine: Normal calvarium and skull base. Visualized upper cervical spine and soft tissues are normal.   Sinuses/Orbits:No paranasal sinus fluid levels or advanced mucosal thickening.  No mastoid or middle ear effusion. Normal orbits.   IMPRESSION: Normal brain MRI.  PATIENT SURVEYS:  FOTO 53/100 , FOTO 64 (predicted level)   COGNITION: Overall cognitive status: Within functional limits for tasks assessed  SENSATION: WFL  POSTURE: rounded shoulders, forward head, and increased thoracic kyphosis  PALPATION: TTP and sore along upper trap and cervical paraspinals, trigger points in R and L UT   CERVICAL ROM:   Active ROM A/PROM (deg) eval 12/30/21 01/05/22 9/28 10/31  Flexion 100% no pain WFL WFL WNL    [facility]    Right lateral flexion Mod limitation w/pain Mild limitations Mild limitations Severe limitation, leans with trunk  Decreased 75%   Left lateral flexion Mod limitation w/pain Mod limitations Mild limitations Severe limitation, leans with trunk  Decreased 75%  Right rotation 75% w/pain Mild limitation w/pain WFL pain at end range  Mild limitation  Decreased 25%   Left rotation 75% w/pain  Mild limitation w/pain  WFL pain at end range  Mild limitation  Decreased 25%   (Blank rows = not tested)  UPPER EXTREMITY ROM: WFL, limited in IR and ER due to prior RC surgery years ago   UPPER EXTREMITY MMT:  MMT Right eval Left eval Right 01/05/22 Left 01/05/22 R 9/28 L 9/28  Shoulder flexion 4+ 4+ _0 Shoulder extension        Shoulder abduction 4+ very painful 4+ 4+ with pain _1 Shoulder adduction        Shoulder extension        Shoulder internal rotation 4+ 4+ _2 Shoulder external rotation     5 5  Middle trapezius        Lower trapezius        Elbow flexion 5 5      Elbow extension 5 5      Wrist flexion        Wrist extension        Wrist ulnar deviation        Wrist radial deviation        Wrist pronation        Wrist supination        Grip  strength         (Blank rows = not tested)   TODAY'S TREATMENT:  03/29/22  UBE level 4 x 6 minutes  Seated row 20# Lats 20# Black tband lumbar  extension 15# AR press 10# straight arm pulls 5# hip abduction and extension 2x10 each Leg press 40# 2x10 Feet on ball bridge Dead bug Partial sit ups    April 03, 2022 DN to the upper traps and the cervical area STM to the upper traps, rhomboids, cervical area Thoracic mobilizations Some gentle contract relax for cervical ROM  03/15/22 Seated row 20# Lats 20# Black tband extension 2# cuff weights flexion and abduction shoulders Feet on ball K2C, trunk rotation, bridge and isometric abs Dead bug with ball Clamshell with bridge Ball squeeze with bridge  03/10/22 DN to the occipital area, the rhomboid, upper trap and TMJ STM to above Cervical traction 16# x 10 minutes  03/08/22 UBE level 4 x 6 minutes Resisted gait all motions Seated rows 20#  Lats 20# Black tband back extension Black tband abdominals 10# straight arm pulls 5# hip abduction and extension 10# side bends W backs 50# leg press 2 x 10 Feet on ball K2C, bridges, isometric abs Partial situps  03/03/22 DN to the occiput and the upper traps STM  to the above, cervical area, rhomboid, temples and TMJ  03/01/22 UBE level 4 x 6 mintues 25# rows 20# lats Black tband abs 10# leg extension 20# leg curls 40# resisted gait all directions 10# straight arm extension core activation 15# AR press Cervical traction 14# x 10 minutes  02/24/22 DN to the upper traps and the occipital area STM to the above and into the rhomboids and the TMJ   02/22/22 Nustep Level 6 x 6 minutes 10# straight arm pulls 10# AR press W backsLeg press 40# 2x12 25# Lats 2x12 Leg press 40# 5# marches  Feet on ball K2C, trunk rotation, small bridges, isometric abs Passive stretch LE's  02/17/22 UBE level 4 x 5 minutes Seated row 20# 2x10 Lats 20# 2x10 5# straight arm pulls with cues for core and posture 20# farmer carry one lap Weighted ball over head lift Yellow tband horizontal abduction W backs Red tband hip extension  and abduction Feet on ball K2C, trunk rotation, small bridges and isometric abs Passive stretches of the LE's Shoulder and neck streches      PATIENT EDUCATION:  Education details: POC Person educated: Patient Education method: Explanation Education comprehension: verbalized understanding   HOME EXERCISE PROGRAM: Access Code: [email or web address] URL: [email or web address] Date: 12/01/2021 Prepared by: Andris Baumann   ASSESSMENT:  CLINICAL IMPRESSION: Patient reports that she was able to tolerate all the cooking and cleaning pretty well, reports pleased with her stamina and pain levels.  She is still hurting and overall reports less and less intensity.  REHAB POTENTIAL: Good  CLINICAL DECISION MAKING: Stable/uncomplicated  EVALUATION COMPLEXITY: Low   GOALS: Goals reviewed with patient? Yes  SHORT TERM GOALS: Target date: 12/29/21  Patient will be independent with initial HEP.  Goal status: MET  LONG TERM GOALS: Target date: 02/26/22  Patient will be independent with advanced/ongoing HEP to improve outcomes and carryover.  Goal status: IN PROGRESS  2.  Patient will report 75% improvement in neck pain to improve QOL.   Baseline: 9/28- not even 50% improvement maybe 40% Goal status: IN PROGRESS  3.  Patient will demonstrate full pain free cervical ROM to complete ADLs and play with grandchildren with more ease. Goal status: IN PROGRESS  9/28- better but still limited (see above)  4.  Patient will demonstrate improved posture to decrease muscle imbalance and tightness.  Baseline: increased tightness along cervical paraspinals Goal status: IN PROGRESS 9/28- ongoing impaired posture   5.  Patient will report 79 on FOTO to demonstrate improved functional ability.  Baseline:  9/28- 64  Goal status: MET  6.  Will demonstrate improved core strength as evidenced by passing double leg lower test  Baseline:  Goal status: INITIAL    PLAN: PT FREQUENCY:  2x/week  PT DURATION: 4 weeks  PLANNED INTERVENTIONS: Therapeutic exercises, Therapeutic activity, Neuromuscular re-education, Balance training, Gait training, Patient/Family education, Self Care, Joint mobilization, Dry Needling, Spinal manipulation, Spinal mobilization, Cryotherapy, Moist heat, Taping, Vasopneumatic device, Traction, Ionotophoresis 30m/ml Dexamethasone, and Manual therapy  PLAN FOR NEXT SESSION: continue to alter program frpm strength and core activation to working on the spasms and trigger points to help with progression  MLum Babe PT 03/29/2022, 10:23 AM   [place] [address]Phone: [phone]  Fax:  [phone]

## 2022-03-30 ENCOUNTER — Encounter: Payer: Self-pay | Admitting: Physical Therapy

## 2022-03-30 ENCOUNTER — Ambulatory Visit: Payer: Medicare Other | Admitting: Physical Therapy

## 2022-03-30 DIAGNOSIS — M542 Cervicalgia: Secondary | ICD-10-CM

## 2022-03-30 DIAGNOSIS — M6281 Muscle weakness (generalized): Secondary | ICD-10-CM | POA: Diagnosis not present

## 2022-03-30 DIAGNOSIS — R519 Headache, unspecified: Secondary | ICD-10-CM | POA: Diagnosis not present

## 2022-03-30 DIAGNOSIS — G8929 Other chronic pain: Secondary | ICD-10-CM | POA: Diagnosis not present

## 2022-03-30 NOTE — Therapy (Signed)
OUTPATIENT PHYSICAL THERAPY CERVICAL TREATMENT   Patient Name: Veronica Galvan MRN: [id number] DOB:23-Aug-1955, 66 y.o., female Today's Date: 03/30/2022    Past Medical History:  Diagnosis Date   Allergy    Anxiety    Arthritis    hands   Cataract    Diabetes mellitus without complication ([place])    type 2   Family history of adverse reaction to anesthesia    son has malignant hyperthermia, daughter does not daughter recently had c section without problems   GERD (gastroesophageal reflux disease)    Headache    sinus   Hyperlipidemia    Hypertension    PONV (postoperative nausea and vomiting)    nausea only   Ulcer, stomach peptic yrs ago   Past Surgical History:  Procedure Laterality Date   APPENDECTOMY     both hells bone spur repair     both heels with metal clips   both shoulder rotator cuff repair     CESAREAN SECTION     x 1   COLONOSCOPY WITH PROPOFOL N/A 09/07/2016   Procedure: COLONOSCOPY WITH PROPOFOL;  Surgeon: Garlan Fair, MD;  Location: [facility];  Service: Endoscopy;  Laterality: N/A;   colonscopy  06/2011   polyps   EYE SURGERY     FRACTURE SURGERY     TUBAL LIGATION     VESICO-VAGINAL FISTULA REPAIR     Patient Active Problem List   Diagnosis Date Noted   Gastroesophageal reflux disease 11/16/2021   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 08/18/2021   Dermatofibroma 08/18/2021   History of malignant neoplasm of skin 08/18/2021   Lentigo 08/18/2021   Melanocytic nevi of trunk 08/18/2021   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   Rosacea 08/18/2021   Actinic keratosis 08/18/2021   Sensorineural hearing loss (SNHL) of left ear with unrestricted hearing of right ear 07/26/2021   Tinnitus of left ear 07/26/2021   Acute recurrent maxillary sinusitis 06/22/2021   Primary hypertension 01/12/2021   Hyperlipidemia associated with type 2 diabetes mellitus ([place]) 01/12/2021   Arthritis 01/12/2021   Type II  diabetes mellitus ([place]) 11/25/2019   Ingrown toenail 09/05/2018   Neck pain 01/29/2018   Tick bite 10/24/2017    PCP: Jodi Mourning  REFERRING PROVIDER: Genia Harold  REFERRING DIAG: M54.2  THERAPY DIAG:  Cervicalgia  Muscle weakness (generalized)  Chronic intractable headache, unspecified headache type  Rationale for Evaluation and Treatment Rehabilitation  ONSET DATE: 11/08/21  SUBJECTIVE:  SUBJECTIVE STATEMENT:  Patient reports that she was very sore after the treatment yesterday, hips and shoulders and could feel her core being sore. PERTINENT HISTORY:  Arthritis, HTN, bilateral rotator cuff surgery 15-38yr ago, DM  PAIN:  Are you having pain? Yes: NPRS scale: 7/10 Pain location: head and neck, shoulders  Pain description: tight  Aggravating factors: moving Relieving factors: massage, standing in the shower  WEIGHT BEARING RESTRICTIONS No  FALLS:  Has patient fallen in last 6 months? No  LIVING ENVIRONMENT: Lives with: lives alone Lives in: House/apartment Stairs: Yes: External: 3 steps; can reach both Has following equipment at home: None  OCCUPATION: reitred  PLOF: Independent  PATIENT GOALS to loosen it up, find out what is causing my headaches   OBJECTIVE:   DIAGNOSTIC FINDINGS:   FINDINGS: Brain: No acute infarct, mass effect or extra-axial collection. No acute or chronic hemorrhage. Normal white matter signal, parenchymal volume and CSF spaces. The midline structures are normal.  Skull and upper cervical spine: Normal calvarium and skull base. Visualized upper cervical spine and soft tissues are normal.   Sinuses/Orbits:No paranasal sinus fluid levels or advanced mucosal thickening. No mastoid or middle ear effusion. Normal orbits.    IMPRESSION: Normal brain MRI.  PATIENT SURVEYS:  FOTO 53/100 , FOTO 64 (predicted level)   COGNITION: Overall cognitive status: Within functional limits for tasks assessed  SENSATION: WFL  POSTURE: rounded shoulders, forward head, and increased thoracic kyphosis  PALPATION: TTP and sore along upper trap and cervical paraspinals, trigger points in R and L UT   CERVICAL ROM:   Active ROM A/PROM (deg) eval 12/30/21 01/05/22 9/28 10/31  Flexion 100% no pain WFL WFL WNL    [facility]    Right lateral flexion Mod limitation w/pain Mild limitations Mild limitations Severe limitation, leans with trunk  Decreased 75%   Left lateral flexion Mod limitation w/pain Mod limitations Mild limitations Severe limitation, leans with trunk  Decreased 75%  Right rotation 75% w/pain Mild limitation w/pain WFL pain at end range  Mild limitation  Decreased 25%   Left rotation 75% w/pain  Mild limitation w/pain  WFL pain at end range  Mild limitation  Decreased 25%   (Blank rows = not tested)  UPPER EXTREMITY ROM: WFL, limited in IR and ER due to prior RC surgery years ago   UPPER EXTREMITY MMT:  MMT Right eval Left eval Right 01/05/22 Left 01/05/22 R 9/28 L 9/28  Shoulder flexion 4+ 4+ _0 Shoulder extension        Shoulder abduction 4+ very painful 4+ 4+ with pain _1 Shoulder adduction        Shoulder extension        Shoulder internal rotation 4+ 4+ _2 Shoulder external rotation     5 5  Middle trapezius        Lower trapezius        Elbow flexion 5 5      Elbow extension 5 5      Wrist flexion        Wrist extension        Wrist ulnar deviation        Wrist radial deviation        Wrist pronation        Wrist supination        Grip strength         (Blank rows =  not tested)   TODAY'S TREATMENT:  03/30/22 DN to the upper traps and the occipital area STM to the cervical area, the TMJ, upper trap, rhomboids and temples Passive  stretch to the cervical area  03/29/22  UBE level 4 x 6 minutes  Seated row 20# Lats 20# Black tband lumbar extension 15# AR press 10# straight arm pulls 5# hip abduction and extension 2x10 each Leg press 40# 2x10 Feet on ball bridge Dead bug Partial sit ups    03/21/2022 DN to the upper traps and the cervical area STM to the upper traps, rhomboids, cervical area Thoracic mobilizations Some gentle contract relax for cervical ROM  03/15/22 Seated row 20# Lats 20# Black tband extension 2# cuff weights flexion and abduction shoulders Feet on ball K2C, trunk rotation, bridge and isometric abs Dead bug with ball Clamshell with bridge Ball squeeze with bridge  03/10/22 DN to the occipital area, the rhomboid, upper trap and TMJ STM to above Cervical traction 16# x 10 minutes  03/08/22 UBE level 4 x 6 minutes Resisted gait all motions Seated rows 20#  Lats 20# Black tband back extension Black tband abdominals 10# straight arm pulls 5# hip abduction and extension 10# side bends W backs 50# leg press 2 x 10 Feet on ball K2C, bridges, isometric abs Partial situps  03/03/22 DN to the occiput and the upper traps STM  to the above, cervical area, rhomboid, temples and TMJ  03/01/22 UBE level 4 x 6 mintues 25# rows 20# lats Black tband abs 10# leg extension 20# leg curls 40# resisted gait all directions 10# straight arm extension core activation 15# AR press Cervical traction 14# x 10 minutes  02/24/22 DN to the upper traps and the occipital area STM to the above and into the rhomboids and the TMJ   02/22/22 Nustep Level 6 x 6 minutes 10# straight arm pulls 10# AR press W backsLeg press 40# 2x12 25# Lats 2x12 Leg press 40# 5# marches  Feet on ball K2C, trunk rotation, small bridges, isometric abs Passive stretch LE's  02/17/22 UBE level 4 x 5 minutes Seated row 20# 2x10 Lats 20# 2x10 5# straight arm pulls with cues for core and posture 20# farmer  carry one lap Weighted ball over head lift Yellow tband horizontal abduction W backs Red tband hip extension and abduction Feet on ball K2C, trunk rotation, small bridges and isometric abs Passive stretches of the LE's Shoulder and neck streches      PATIENT EDUCATION:  Education details: POC Person educated: Patient Education method: Explanation Education comprehension: verbalized understanding   HOME EXERCISE PROGRAM: Access Code: [email or web address] URL: [email or web address] Date: 12/01/2021 Prepared by: Andris Baumann   ASSESSMENT:  CLINICAL IMPRESSION: I feel that the upper traps are a little less tight and a little more mobile, she is still with knots in the upper trap, TMJ and temple areas, she feels that she is improving and has questions about how the exercises help and not make the mms tighter, we discussed strong flexible mms are what we are looking for  REHAB POTENTIAL: Good  CLINICAL DECISION MAKING: Stable/uncomplicated  EVALUATION COMPLEXITY: Low   GOALS: Goals reviewed with patient? Yes  SHORT TERM GOALS: Target date: 12/29/21  Patient will be independent with initial HEP.  Goal status: MET  LONG TERM GOALS: Target date: 02/26/22  Patient will be independent with advanced/ongoing HEP to improve outcomes and carryover.  Goal status: IN PROGRESS  2.  Patient will report 75% improvement in neck pain  to improve QOL.   Baseline: 9/28- not even 50% improvement maybe 40% Goal status: IN PROGRESS  3.  Patient will demonstrate full pain free cervical ROM to complete ADLs and play with grandchildren with more ease. Goal status: IN PROGRESS 9/28- better but still limited (see above)  4.  Patient will demonstrate improved posture to decrease muscle imbalance and tightness.  Baseline: increased tightness along cervical paraspinals Goal status: IN PROGRESS 9/28- ongoing impaired posture   5.  Patient will report 61 on FOTO to demonstrate improved  functional ability.  Baseline:  9/28- 64  Goal status: MET  6.  Will demonstrate improved core strength as evidenced by passing double leg lower test  Baseline:  Goal status: INITIAL    PLAN: PT FREQUENCY: 2x/week  PT DURATION: 4 weeks  PLANNED INTERVENTIONS: Therapeutic exercises, Therapeutic activity, Neuromuscular re-education, Balance training, Gait training, Patient/Family education, Self Care, Joint mobilization, Dry Needling, Spinal manipulation, Spinal mobilization, Cryotherapy, Moist heat, Taping, Vasopneumatic device, Traction, Ionotophoresis 21m/ml Dexamethasone, and Manual therapy  PLAN FOR NEXT SESSION: continue to alter program from strength and core activation to working on the spasms and trigger points to help with progression  MLum Babe PT 03/30/2022, 4:13 PM   [place] [address]Phone: [phone]  Fax:  [phone]

## 2022-03-31 ENCOUNTER — Encounter: Payer: Self-pay | Admitting: Physical Therapy

## 2022-04-04 ENCOUNTER — Encounter: Payer: Self-pay | Admitting: Family

## 2022-04-04 ENCOUNTER — Other Ambulatory Visit: Payer: Self-pay | Admitting: Family

## 2022-04-04 MED ORDER — LEVOCETIRIZINE DIHYDROCHLORIDE 5 MG PO TABS: 5.0000 mg | ORAL_TABLET | Freq: Every evening | ORAL | 3 refills | Status: DC

## 2022-04-05 ENCOUNTER — Ambulatory Visit: Payer: Medicare Other | Attending: Psychiatry | Admitting: Physical Therapy

## 2022-04-05 ENCOUNTER — Encounter: Payer: Self-pay | Admitting: Physical Therapy

## 2022-04-05 DIAGNOSIS — G8929 Other chronic pain: Secondary | ICD-10-CM | POA: Diagnosis not present

## 2022-04-05 DIAGNOSIS — M6281 Muscle weakness (generalized): Secondary | ICD-10-CM | POA: Insufficient documentation

## 2022-04-05 DIAGNOSIS — R519 Headache, unspecified: Secondary | ICD-10-CM | POA: Diagnosis not present

## 2022-04-05 DIAGNOSIS — M542 Cervicalgia: Secondary | ICD-10-CM | POA: Insufficient documentation

## 2022-04-05 NOTE — Therapy (Signed)
OUTPATIENT PHYSICAL THERAPY CERVICAL TREATMENT   Patient Name: Veronica Galvan MRN: [id number] DOB:1956/03/10, 66 y.o., female Today's Date: 04/05/2022    Past Medical History:  Diagnosis Date   Allergy    Anxiety    Arthritis    hands   Cataract    Diabetes mellitus without complication ([place])    type 2   Family history of adverse reaction to anesthesia    son has malignant hyperthermia, daughter does not daughter recently had c section without problems   GERD (gastroesophageal reflux disease)    Headache    sinus   Hyperlipidemia    Hypertension    PONV (postoperative nausea and vomiting)    nausea only   Ulcer, stomach peptic yrs ago   Past Surgical History:  Procedure Laterality Date   APPENDECTOMY     both hells bone spur repair     both heels with metal clips   both shoulder rotator cuff repair     CESAREAN SECTION     x 1   COLONOSCOPY WITH PROPOFOL N/A 09/07/2016   Procedure: COLONOSCOPY WITH PROPOFOL;  Surgeon: Garlan Fair, MD;  Location: [facility];  Service: Endoscopy;  Laterality: N/A;   colonscopy  06/2011   polyps   EYE SURGERY     FRACTURE SURGERY     TUBAL LIGATION     VESICO-VAGINAL FISTULA REPAIR     Patient Active Problem List   Diagnosis Date Noted   Gastroesophageal reflux disease 11/16/2021   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 08/18/2021   Dermatofibroma 08/18/2021   History of malignant neoplasm of skin 08/18/2021   Lentigo 08/18/2021   Melanocytic nevi of trunk 08/18/2021   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   Rosacea 08/18/2021   Actinic keratosis 08/18/2021   Sensorineural hearing loss (SNHL) of left ear with unrestricted hearing of right ear 07/26/2021   Tinnitus of left ear 07/26/2021   Acute recurrent maxillary sinusitis 06/22/2021   Primary hypertension 01/12/2021   Hyperlipidemia associated with type 2 diabetes mellitus ([place]) 01/12/2021   Arthritis 01/12/2021   Type II  diabetes mellitus ([place]) 11/25/2019   Ingrown toenail 09/05/2018   Neck pain 01/29/2018   Tick bite 10/24/2017    PCP: Jodi Mourning  REFERRING PROVIDER: Genia Harold  REFERRING DIAG: M54.2  THERAPY DIAG:  Cervicalgia  Muscle weakness (generalized)  Chronic intractable headache, unspecified headache type  Rationale for Evaluation and Treatment Rehabilitation  ONSET DATE: 11/08/21  SUBJECTIVE:  SUBJECTIVE STATEMENT:  Continues to have daily HA, neck pain and jaw pain, she reports that she is about 20% less intensity and she feels that she has a little more movement, she also reports that she has been able to do more since starting here, reports that prior to beginning PT she really was limited PERTINENT HISTORY:  Arthritis, HTN, bilateral rotator cuff surgery 15-72yr ago, DM  PAIN:  Are you having pain? Yes: NPRS scale: 7/10 Pain location: head and neck, shoulders  Pain description: tight  Aggravating factors: moving Relieving factors: massage, standing in the shower  WEIGHT BEARING RESTRICTIONS No  FALLS:  Has patient fallen in last 6 months? No  LIVING ENVIRONMENT: Lives with: lives alone Lives in: House/apartment Stairs: Yes: External: 3 steps; can reach both Has following equipment at home: None  OCCUPATION: reitred  PLOF: Independent  PATIENT GOALS to loosen it up, find out what is causing my headaches   OBJECTIVE:   DIAGNOSTIC FINDINGS:   FINDINGS: Brain: No acute infarct, mass effect or extra-axial collection. No acute or chronic hemorrhage. Normal white matter signal, parenchymal volume and CSF spaces. The midline structures are normal.  Skull and upper cervical spine: Normal calvarium and skull base. Visualized upper cervical spine and soft tissues  are normal.   Sinuses/Orbits:No paranasal sinus fluid levels or advanced mucosal thickening. No mastoid or middle ear effusion. Normal orbits.   IMPRESSION: Normal brain MRI.  PATIENT SURVEYS:  FOTO 53/100 ,  April 08, 2022 has been up to 675and today was 577but has been doing a lot and lifting and is back doing much more than she was   COGNITION: Overall cognitive status: Within functional limits for tasks assessed  SENSATION: WFL  POSTURE: rounded shoulders, forward head, and increased thoracic kyphosis  PALPATION: TTP and sore along upper trap and cervical paraspinals, trigger points in R and L UT   CERVICAL ROM:   Active ROM A/PROM (deg) eval 12/30/21 01/05/22 9/28 10/31 04/05/22  Flexion 100% no pain WFL WFL WNL   WNL  [facility]   WNL  Right lateral flexion Mod limitation w/pain Mild limitations Mild limitations Severe limitation, leans with trunk  Decreased 75%  Decreased 50%  Left lateral flexion Mod limitation w/pain Mod limitations Mild limitations Severe limitation, leans with trunk  Decreased 75% Decreased  50%  Right rotation 75% w/pain Mild limitation w/pain WFL pain at end range  Mild limitation  Decreased 25%  Decreased 25%  Left rotation 75% w/pain  Mild limitation w/pain  WFL pain at end range  Mild limitation  Decreased 25% Decreased  25%   (Blank rows = not tested)  UPPER EXTREMITY ROM: WFL, limited in IR and ER due to prior RC surgery years ago   UPPER EXTREMITY MMT:  MMT Right eval Left eval Right 01/05/22 Left 01/05/22 R 9/28 L 9/28  Shoulder flexion 4+ 4+ _0 Shoulder extension        Shoulder abduction 4+ very painful 4+ 4+ with pain _1 Shoulder adduction        Shoulder extension        Shoulder internal rotation 4+ 4+ _2 Shoulder external rotation     5 5  Middle trapezius        Lower trapezius        Elbow flexion 5 5      Elbow extension 5 5  Wrist flexion        Wrist extension         Wrist ulnar deviation        Wrist radial deviation        Wrist pronation        Wrist supination        Grip strength         (Blank rows = not tested)   TODAY'S TREATMENT:  04/08/22 UBE level 4 x 6 minutes Hip extension and abduction 5# 10# straight arm pulls 15# AR press Black tband abs and back 5# chest press 20# farmer carry Feet on ball trunk rotation, bridges Isometric abs  03/30/22 DN to the upper traps and the occipital area STM to the cervical area, the TMJ, upper trap, rhomboids and temples Passive stretch to the cervical area  03/29/22  UBE level 4 x 6 minutes  Seated row 20# Lats 20# Black tband lumbar extension 15# AR press 10# straight arm pulls 5# hip abduction and extension 2x10 each Leg press 40# 2x10 Feet on ball bridge Dead bug Partial sit ups    04/15/2022 DN to the upper traps and the cervical area STM to the upper traps, rhomboids, cervical area Thoracic mobilizations Some gentle contract relax for cervical ROM  03/15/22 Seated row 20# Lats 20# Black tband extension 2# cuff weights flexion and abduction shoulders Feet on ball K2C, trunk rotation, bridge and isometric abs Dead bug with ball Clamshell with bridge Ball squeeze with bridge  03/10/22 DN to the occipital area, the rhomboid, upper trap and TMJ STM to above Cervical traction 16# x 10 minutes  03/08/22 UBE level 4 x 6 minutes Resisted gait all motions Seated rows 20#  Lats 20# Black tband back extension Black tband abdominals 10# straight arm pulls 5# hip abduction and extension 10# side bends W backs 50# leg press 2 x 10 Feet on ball K2C, bridges, isometric abs Partial situps  03/03/22 DN to the occiput and the upper traps STM  to the above, cervical area, rhomboid, temples and TMJ  03/01/22 UBE level 4 x 6 mintues 25# rows 20# lats Black tband abs 10# leg extension 20# leg curls 40# resisted gait all directions 10# straight arm extension core  activation 15# AR press Cervical traction 14# x 10 minutes  02/24/22 DN to the upper traps and the occipital area STM to the above and into the rhomboids and the TMJ   02/22/22 Nustep Level 6 x 6 minutes 10# straight arm pulls 10# AR press W backsLeg press 40# 2x12 25# Lats 2x12 Leg press 40# 5# marches  Feet on ball K2C, trunk rotation, small bridges, isometric abs Passive stretch LE's  02/17/22 UBE level 4 x 5 minutes Seated row 20# 2x10 Lats 20# 2x10 5# straight arm pulls with cues for core and posture 20# farmer carry one lap Weighted ball over head lift Yellow tband horizontal abduction W backs Red tband hip extension and abduction Feet on ball K2C, trunk rotation, small bridges and isometric abs Passive stretches of the LE's Shoulder and neck streches      PATIENT EDUCATION:  Education details: POC Person educated: Patient Education method: Explanation Education comprehension: verbalized understanding   HOME EXERCISE PROGRAM: Access Code: [email or web address] URL: [email or web address] Date: 12/01/2021 Prepared by: Andris Baumann   ASSESSMENT:  CLINICAL IMPRESSION: I feel that the upper traps are a little less tight and a little more mobile, she is still with knots in the upper trap, TMJ and temple  areas, she feels that she is improving and has questions about how the exercises help and not make the mms tighter, we discussed strong flexible mms are what we are looking for  REHAB POTENTIAL: Good  CLINICAL DECISION MAKING: Stable/uncomplicated  EVALUATION COMPLEXITY: Low   GOALS: Goals reviewed with patient? Yes  SHORT TERM GOALS: Target date: 12/29/21  Patient will be independent with initial HEP.  Goal status: MET  LONG TERM GOALS: Target date: 02/26/22  Patient will be independent with advanced/ongoing HEP to improve outcomes and carryover.  Goal status: IN PROGRESS  2.  Patient will report 75% improvement in neck pain to improve QOL.    Baseline: 9/28- not even 50% improvement maybe 40% Goal status: IN PROGRESS  3.  Patient will demonstrate full pain free cervical ROM to complete ADLs and play with grandchildren with more ease. Goal status: IN PROGRESS 9/28- better but still limited (see above)  4.  Patient will demonstrate improved posture to decrease muscle imbalance and tightness.  Baseline: increased tightness along cervical paraspinals Goal status: IN PROGRESS 9/28- ongoing impaired posture   5.  Patient will report 28 on FOTO to demonstrate improved functional ability.  Baseline:  9/28- 64  Goal status: MET but has regressed as she is doing more and more  6.  Will demonstrate improved core strength as evidenced by passing double leg lower test  Baseline:  Goal status: progressing    PLAN: PT FREQUENCY: 2x/week  PT DURATION: 4 weeks  PLANNED INTERVENTIONS: Therapeutic exercises, Therapeutic activity, Neuromuscular re-education, Balance training, Gait training, Patient/Family education, Self Care, Joint mobilization, Dry Needling, Spinal manipulation, Spinal mobilization, Cryotherapy, Moist heat, Taping, Vasopneumatic device, Traction, Ionotophoresis 71m/ml Dexamethasone, and Manual therapy  PLAN FOR NEXT SESSION: WE continue to work with Veronica Pulseprogressing strength and function as she tolerates.  She overall is doing more and more compared to what she was able to do prior to starting PT and reports that she was not abe to do it before.  MLum Babe PT 04/05/2022, 10:20 AM   [place] [address]Phone: [phone]  Fax:  [phone]

## 2022-04-07 ENCOUNTER — Ambulatory Visit: Payer: Medicare Other | Admitting: Physical Therapy

## 2022-04-07 ENCOUNTER — Encounter: Payer: Self-pay | Admitting: Physical Therapy

## 2022-04-07 DIAGNOSIS — G8929 Other chronic pain: Secondary | ICD-10-CM

## 2022-04-07 DIAGNOSIS — M542 Cervicalgia: Secondary | ICD-10-CM | POA: Diagnosis not present

## 2022-04-07 DIAGNOSIS — M6281 Muscle weakness (generalized): Secondary | ICD-10-CM | POA: Diagnosis not present

## 2022-04-07 DIAGNOSIS — R519 Headache, unspecified: Secondary | ICD-10-CM | POA: Diagnosis not present

## 2022-04-07 NOTE — Therapy (Signed)
OUTPATIENT PHYSICAL THERAPY CERVICAL TREATMENT   Patient Name: Veronica Galvan MRN: [id number] DOB:28-Jan-1956, 66 y.o., female Today's Date: 04/07/2022    Past Medical History:  Diagnosis Date   Allergy    Anxiety    Arthritis    hands   Cataract    Diabetes mellitus without complication ([place])    type 2   Family history of adverse reaction to anesthesia    son has malignant hyperthermia, daughter does not daughter recently had c section without problems   GERD (gastroesophageal reflux disease)    Headache    sinus   Hyperlipidemia    Hypertension    PONV (postoperative nausea and vomiting)    nausea only   Ulcer, stomach peptic yrs ago   Past Surgical History:  Procedure Laterality Date   APPENDECTOMY     both hells bone spur repair     both heels with metal clips   both shoulder rotator cuff repair     CESAREAN SECTION     x 1   COLONOSCOPY WITH PROPOFOL N/A 09/07/2016   Procedure: COLONOSCOPY WITH PROPOFOL;  Surgeon: Garlan Fair, MD;  Location: [facility];  Service: Endoscopy;  Laterality: N/A;   colonscopy  06/2011   polyps   EYE SURGERY     FRACTURE SURGERY     TUBAL LIGATION     VESICO-VAGINAL FISTULA REPAIR     Patient Active Problem List   Diagnosis Date Noted   Gastroesophageal reflux disease 11/16/2021   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 08/18/2021   Dermatofibroma 08/18/2021   History of malignant neoplasm of skin 08/18/2021   Lentigo 08/18/2021   Melanocytic nevi of trunk 08/18/2021   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   Rosacea 08/18/2021   Actinic keratosis 08/18/2021   Sensorineural hearing loss (SNHL) of left ear with unrestricted hearing of right ear 07/26/2021   Tinnitus of left ear 07/26/2021   Acute recurrent maxillary sinusitis 06/22/2021   Primary hypertension 01/12/2021   Hyperlipidemia associated with type 2 diabetes mellitus ([place]) 01/12/2021   Arthritis 01/12/2021   Type II  diabetes mellitus ([place]) 11/25/2019   Ingrown toenail 09/05/2018   Neck pain 01/29/2018   Tick bite 10/24/2017    PCP: Jodi Mourning  REFERRING PROVIDER: Genia Harold  REFERRING DIAG: M54.2  THERAPY DIAG:  Cervicalgia  Muscle weakness (generalized)  Chronic intractable headache, unspecified headache type  Rationale for Evaluation and Treatment Rehabilitation  ONSET DATE: 11/08/21  SUBJECTIVE:  SUBJECTIVE STATEMENT: Patient stays very busy and active and reports that she was thinking back to last year and reports that she is much better, reports that she could not do the cooking and entertaining that she has been able to do this year, last year  PERTINENT HISTORY:  Arthritis, HTN, bilateral rotator cuff surgery 15-73yr ago, DM  PAIN:  Are you having pain? Yes: NPRS scale: 6/10 Pain location: head and neck, shoulders  Pain description: tight  Aggravating factors: moving Relieving factors: massage, standing in the shower  WEIGHT BEARING RESTRICTIONS No  FALLS:  Has patient fallen in last 6 months? No  LIVING ENVIRONMENT: Lives with: lives alone Lives in: House/apartment Stairs: Yes: External: 3 steps; can reach both Has following equipment at home: None  OCCUPATION: reitred  PLOF: Independent  PATIENT GOALS to loosen it up, find out what is causing my headaches   OBJECTIVE:   DIAGNOSTIC FINDINGS:   FINDINGS: Brain: No acute infarct, mass effect or extra-axial collection. No acute or chronic hemorrhage. Normal white matter signal, parenchymal volume and CSF spaces. The midline structures are normal.  Skull and upper cervical spine: Normal calvarium and skull base. Visualized upper cervical spine and soft tissues are normal.   Sinuses/Orbits:No paranasal sinus  fluid levels or advanced mucosal thickening. No mastoid or middle ear effusion. Normal orbits.   IMPRESSION: Normal brain MRI.  PATIENT SURVEYS:  FOTO 53/100 ,  April 08, 2022 has been up to 610and today was 573but has been doing a lot and lifting and is back doing much more than she was   COGNITION: Overall cognitive status: Within functional limits for tasks assessed  SENSATION: WFL  POSTURE: rounded shoulders, forward head, and increased thoracic kyphosis  PALPATION: TTP and sore along upper trap and cervical paraspinals, trigger points in R and L UT   CERVICAL ROM:   Active ROM A/PROM (deg) eval 12/30/21 01/05/22 9/28 10/31 04/05/22  Flexion 100% no pain WFL WFL WNL   WNL  [facility]   WNL  Right lateral flexion Mod limitation w/pain Mild limitations Mild limitations Severe limitation, leans with trunk  Decreased 75%  Decreased 50%  Left lateral flexion Mod limitation w/pain Mod limitations Mild limitations Severe limitation, leans with trunk  Decreased 75% Decreased  50%  Right rotation 75% w/pain Mild limitation w/pain WFL pain at end range  Mild limitation  Decreased 25%  Decreased 25%  Left rotation 75% w/pain  Mild limitation w/pain  WFL pain at end range  Mild limitation  Decreased 25% Decreased  25%   (Blank rows = not tested)  UPPER EXTREMITY ROM: WFL, limited in IR and ER due to prior RC surgery years ago   UPPER EXTREMITY MMT:  MMT Right eval Left eval Right 01/05/22 Left 01/05/22 R 9/28 L 9/28  Shoulder flexion 4+ 4+ _0 Shoulder extension        Shoulder abduction 4+ very painful 4+ 4+ with pain _1 Shoulder adduction        Shoulder extension        Shoulder internal rotation 4+ 4+ _2 Shoulder external rotation     5 5  Middle trapezius        Lower trapezius        Elbow flexion 5 5      Elbow extension 5 5      Wrist flexion  Wrist extension        Wrist ulnar deviation        Wrist radial  deviation        Wrist pronation        Wrist supination        Grip strength         (Blank rows = not tested)   TODAY'S TREATMENT:  04/07/22 STM to the upper traps, cervical area, the rhomboids and occiput, TMJ and temporal area DN to the traps and occiput   04/05/22 UBE level 4 x 6 minutes Hip extension and abduction 5# 10# straight arm pulls 15# AR press Black tband abs and back 5# chest press 20# farmer carry Feet on ball trunk rotation, bridges Isometric abs  03/30/22 DN to the upper traps and the occipital area STM to the cervical area, the TMJ, upper trap, rhomboids and temples Passive stretch to the cervical area  03/29/22  UBE level 4 x 6 minutes  Seated row 20# Lats 20# Black tband lumbar extension 15# AR press 10# straight arm pulls 5# hip abduction and extension 2x10 each Leg press 40# 2x10 Feet on ball bridge Dead bug Partial sit ups    2022-04-10 DN to the upper traps and the cervical area STM to the upper traps, rhomboids, cervical area Thoracic mobilizations Some gentle contract relax for cervical ROM  03/15/22 Seated row 20# Lats 20# Black tband extension 2# cuff weights flexion and abduction shoulders Feet on ball K2C, trunk rotation, bridge and isometric abs Dead bug with ball Clamshell with bridge Ball squeeze with bridge  03/10/22 DN to the occipital area, the rhomboid, upper trap and TMJ STM to above Cervical traction 16# x 10 minutes  03/08/22 UBE level 4 x 6 minutes Resisted gait all motions Seated rows 20#  Lats 20# Black tband back extension Black tband abdominals 10# straight arm pulls 5# hip abduction and extension 10# side bends W backs 50# leg press 2 x 10 Feet on ball K2C, bridges, isometric abs Partial situps    PATIENT EDUCATION:  Education details: POC Person educated: Patient Education method: Explanation Education comprehension: verbalized understanding   HOME EXERCISE PROGRAM: Access Code:  [email or web address] URL: [email or web address] Date: 12/01/2021 Prepared by: Andris Baumann   ASSESSMENT:  CLINICAL IMPRESSION: Patient has been cooking and entertaining today, she is fatigued but reports that she feels pretty good for all that she has done.  I do feel like the trap has less of a knot.  REHAB POTENTIAL: Good  CLINICAL DECISION MAKING: Stable/uncomplicated  EVALUATION COMPLEXITY: Low   GOALS: Goals reviewed with patient? Yes  SHORT TERM GOALS: Target date: 12/29/21  Patient will be independent with initial HEP.  Goal status: MET  LONG TERM GOALS: Target date: 02/26/22  Patient will be independent with advanced/ongoing HEP to improve outcomes and carryover.  Goal status: IN PROGRESS  2.  Patient will report 75% improvement in neck pain to improve QOL.   Baseline: 9/28- not even 50% improvement maybe 40% Goal status: IN PROGRESS  3.  Patient will demonstrate full pain free cervical ROM to complete ADLs and play with grandchildren with more ease. Goal status: IN PROGRESS 9/28- better but still limited (see above)  4.  Patient will demonstrate improved posture to decrease muscle imbalance and tightness.  Baseline: increased tightness along cervical paraspinals Goal status: IN PROGRESS 9/28- ongoing impaired posture   5.  Patient will report 82 on FOTO to demonstrate improved functional ability.  Baseline:  9/28- 64  Goal status: MET but has regressed as she is doing more and more  6.  Will demonstrate improved core strength as evidenced by passing double leg lower test  Baseline:  Goal status: progressing    PLAN: PT FREQUENCY: 2x/week  PT DURATION: 4 weeks  PLANNED INTERVENTIONS: Therapeutic exercises, Therapeutic activity, Neuromuscular re-education, Balance training, Gait training, Patient/Family education, Self Care, Joint mobilization, Dry Needling, Spinal manipulation, Spinal mobilization, Cryotherapy, Moist heat, Taping, Vasopneumatic device,  Traction, Ionotophoresis 34m/ml Dexamethasone, and Manual therapy  PLAN FOR NEXT SESSION: WE continue to work with KJuliann Pulseprogressing strength and function as she tolerates.  She overall is doing more and more compared to what she was able to do prior to starting PT and reports that she was not abe to do it before.  MLum Babe PT 04/07/2022, 3:37 PM   [place] [address]Phone: [phone]  Fax:  [phone]

## 2022-04-12 ENCOUNTER — Encounter: Payer: Self-pay | Admitting: Physical Therapy

## 2022-04-12 ENCOUNTER — Ambulatory Visit: Payer: Medicare Other | Admitting: Physical Therapy

## 2022-04-12 DIAGNOSIS — M6281 Muscle weakness (generalized): Secondary | ICD-10-CM

## 2022-04-12 DIAGNOSIS — M542 Cervicalgia: Secondary | ICD-10-CM

## 2022-04-12 DIAGNOSIS — R519 Headache, unspecified: Secondary | ICD-10-CM

## 2022-04-12 DIAGNOSIS — G8929 Other chronic pain: Secondary | ICD-10-CM | POA: Diagnosis not present

## 2022-04-12 NOTE — Therapy (Signed)
OUTPATIENT PHYSICAL THERAPY CERVICAL TREATMENT   Patient Name: Veronica Galvan MRN: [id number] DOB:Mar 22, 1956, 66 y.o., female Today's Date: 04/12/2022    Past Medical History:  Diagnosis Date   Allergy    Anxiety    Arthritis    hands   Cataract    Diabetes mellitus without complication ([place])    type 2   Family history of adverse reaction to anesthesia    son has malignant hyperthermia, daughter does not daughter recently had c section without problems   GERD (gastroesophageal reflux disease)    Headache    sinus   Hyperlipidemia    Hypertension    PONV (postoperative nausea and vomiting)    nausea only   Ulcer, stomach peptic yrs ago   Past Surgical History:  Procedure Laterality Date   APPENDECTOMY     both hells bone spur repair     both heels with metal clips   both shoulder rotator cuff repair     CESAREAN SECTION     x 1   COLONOSCOPY WITH PROPOFOL N/A 09/07/2016   Procedure: COLONOSCOPY WITH PROPOFOL;  Surgeon: Garlan Fair, MD;  Location: [facility];  Service: Endoscopy;  Laterality: N/A;   colonscopy  06/2011   polyps   EYE SURGERY     FRACTURE SURGERY     TUBAL LIGATION     VESICO-VAGINAL FISTULA REPAIR     Patient Active Problem List   Diagnosis Date Noted   Gastroesophageal reflux disease 11/16/2021   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) 01/12/2021   Arthritis 01/12/2021   Type II  diabetes mellitus ([place]) 11/25/2019   Ingrown toenail 09/05/2018   Neck pain 01/29/2018   Tick bite 10/24/2017    PCP: Jodi Mourning  REFERRING PROVIDER: Genia Harold  REFERRING DIAG: M54.2  THERAPY DIAG:  Cervicalgia  Muscle weakness (generalized)  Chronic intractable headache, unspecified headache type  Rationale for Evaluation and Treatment Rehabilitation  ONSET DATE: 11/08/21  SUBJECTIVE:  SUBJECTIVE STATEMENT: Patient reports that she is no longer having jaw pain, she still has the HA and the neck pain and tightness PERTINENT HISTORY:  Arthritis, HTN, bilateral rotator cuff surgery 15-65yr ago, DM  PAIN:  Are you having pain? Yes: NPRS scale: 6/10 Pain location: head and neck, shoulders  Pain description: tight  Aggravating factors: moving Relieving factors: massage, standing in the shower  WEIGHT BEARING RESTRICTIONS No  FALLS:  Has patient fallen in last 6 months? No  LIVING ENVIRONMENT: Lives with: lives alone Lives in: House/apartment Stairs: Yes: External: 3 steps; can reach both Has following equipment at home: None  OCCUPATION: reitred  PLOF: Independent  PATIENT GOALS to loosen it up, find out what is causing my headaches   OBJECTIVE:   DIAGNOSTIC FINDINGS:   FINDINGS: Brain: No acute infarct, mass effect or extra-axial collection. No acute or chronic hemorrhage. Normal white matter signal, parenchymal volume and CSF spaces. The midline structures are normal.  Skull and upper cervical spine: Normal calvarium and skull base. Visualized upper cervical spine and soft tissues are normal.   Sinuses/Orbits:No paranasal sinus fluid levels or advanced mucosal thickening. No mastoid or middle ear effusion. Normal orbits.   IMPRESSION: Normal brain  MRI.  PATIENT SURVEYS:  FOTO 53/100 ,  April 08, 2022 has been up to 648and today was 526but has been doing a lot and lifting and is back doing much more than she was   COGNITION: Overall cognitive status: Within functional limits for tasks assessed  SENSATION: WFL  POSTURE: rounded shoulders, forward head, and increased thoracic kyphosis  PALPATION: TTP and sore along upper trap and cervical paraspinals, trigger points in R and L UT   CERVICAL ROM:   Active ROM A/PROM (deg) eval 12/30/21 01/05/22 9/28 10/31 04/05/22  Flexion 100% no pain WFL WFL WNL   WNL  Extension [facility] W[facility] WNL   WNL  Right lateral flexion Mod limitation w/pain Mild limitations Mild limitations Severe limitation, leans with trunk  Decreased 75%  Decreased 50%  Left lateral flexion Mod limitation w/pain Mod limitations Mild limitations Severe limitation, leans with trunk  Decreased 75% Decreased  50%  Right rotation 75% w/pain Mild limitation w/pain WFL pain at end range  Mild limitation  Decreased 25%  Decreased 25%  Left rotation 75% w/pain  Mild limitation w/pain  WFL pain at end range  Mild limitation  Decreased 25% Decreased  25%   (Blank rows = not tested)  UPPER EXTREMITY ROM: WFL, limited in IR and ER due to prior RC surgery years ago   UPPER EXTREMITY MMT:  MMT Right eval Left eval Right 01/05/22 Left 01/05/22 R 9/28 L 9/28  Shoulder flexion 4+ 4+ _0 Shoulder extension        Shoulder abduction 4+ very painful 4+ 4+ with pain _1 Shoulder adduction        Shoulder extension        Shoulder internal rotation 4+ 4+ _2 Shoulder external rotation     5 5  Middle trapezius        Lower trapezius        Elbow flexion 5 5      Elbow extension 5 5      Wrist flexion        Wrist extension        Wrist ulnar deviation        Wrist radial deviation  Wrist pronation        Wrist supination        Grip strength         (Blank rows = not  tested)   TODAY'S TREATMENT:  04/12/22 UBE level 4 x 6 minutes 15# straight arm pull 35# triceps AR press 15# 2x10 Seated row 25# 5# hip abduction and extension 25# lats 40# resisted gait all motions 50# leg press 4.4# ball at arms length walk for core activation   04/07/22 STM to the upper traps, cervical area, the rhomboids and occiput, TMJ and temporal area DN to the traps and occiput   04/05/22 UBE level 4 x 6 minutes Hip extension and abduction 5# 10# straight arm pulls 15# AR press Black tband abs and back 5# chest press 20# farmer carry Feet on ball trunk rotation, bridges Isometric abs  03/30/22 DN to the upper traps and the occipital area STM to the cervical area, the TMJ, upper trap, rhomboids and temples Passive stretch to the cervical area  03/29/22  UBE level 4 x 6 minutes  Seated row 20# Lats 20# Black tband lumbar extension 15# AR press 10# straight arm pulls 5# hip abduction and extension 2x10 each Leg press 40# 2x10 Feet on ball bridge Dead bug Partial sit ups    2022/04/01 DN to the upper traps and the cervical area STM to the upper traps, rhomboids, cervical area Thoracic mobilizations Some gentle contract relax for cervical ROM  03/15/22 Seated row 20# Lats 20# Black tband extension 2# cuff weights flexion and abduction shoulders Feet on ball K2C, trunk rotation, bridge and isometric abs Dead bug with ball Clamshell with bridge Ball squeeze with bridge  03/10/22 DN to the occipital area, the rhomboid, upper trap and TMJ STM to above Cervical traction 16# x 10 minutes  03/08/22 UBE level 4 x 6 minutes Resisted gait all motions Seated rows 20#  Lats 20# Black tband back extension Black tband abdominals 10# straight arm pulls 5# hip abduction and extension 10# side bends W backs 50# leg press 2 x 10 Feet on ball K2C, bridges, isometric abs Partial situps    PATIENT EDUCATION:  Education details: POC Person educated:  Patient Education method: Explanation Education comprehension: verbalized understanding   HOME EXERCISE PROGRAM: Access Code: [email or web address] URL: [email or web address] Date: 12/01/2021 Prepared by: Andris Baumann   ASSESSMENT:  CLINICAL IMPRESSION: Patient continues to be very active and again reports that she used to suffer really bad when she tried to do things but has been able to tolerate better with less pain, still has the HA daily but is reporting decreased intensity  REHAB POTENTIAL: Good  CLINICAL DECISION MAKING: Stable/uncomplicated  EVALUATION COMPLEXITY: Low   GOALS: Goals reviewed with patient? Yes  SHORT TERM GOALS: Target date: 12/29/21  Patient will be independent with initial HEP.  Goal status: MET  LONG TERM GOALS: Target date: 02/26/22  Patient will be independent with advanced/ongoing HEP to improve outcomes and carryover.  Goal status: IN PROGRESS  2.  Patient will report 75% improvement in neck pain to improve QOL.   Baseline: 9/28- not even 50% improvement maybe 40% Goal status: IN PROGRESS  3.  Patient will demonstrate full pain free cervical ROM to complete ADLs and play with grandchildren with more ease. Goal status: IN PROGRESS 9/28- better but still limited (see above)  4.  Patient will demonstrate improved posture to decrease muscle imbalance and tightness.  Baseline: increased tightness along cervical paraspinals Goal status: IN PROGRESS  9/28- ongoing impaired posture   5.  Patient will report 61 on FOTO to demonstrate improved functional ability.  Baseline:  9/28- 64  Goal status: MET but has regressed as she is doing more and more  6.  Will demonstrate improved core strength as evidenced by passing double leg lower test  Baseline:  Goal status: progressing    PLAN: PT FREQUENCY: 2x/week  PT DURATION: 4 weeks  PLANNED INTERVENTIONS: Therapeutic exercises, Therapeutic activity, Neuromuscular re-education, Balance  training, Gait training, Patient/Family education, Self Care, Joint mobilization, Dry Needling, Spinal manipulation, Spinal mobilization, Cryotherapy, Moist heat, Taping, Vasopneumatic device, Traction, Ionotophoresis 73m/ml Dexamethasone, and Manual therapy  PLAN FOR NEXT SESSION: WE continue to work with KJuliann Pulseprogressing strength and function as she tolerates.  She overall is doing more and more compared to what she was able to do prior to starting PT and reports that she was not abe to do it before.  MLum Babe PT 04/12/2022, 10:20 AM   [place] [address]Phone: [phone]  Fax:  [phone]

## 2022-04-14 ENCOUNTER — Encounter: Payer: Self-pay | Admitting: Physical Therapy

## 2022-04-14 ENCOUNTER — Other Ambulatory Visit (HOSPITAL_BASED_OUTPATIENT_CLINIC_OR_DEPARTMENT_OTHER): Payer: Self-pay | Admitting: Family

## 2022-04-14 ENCOUNTER — Ambulatory Visit: Payer: Medicare Other | Admitting: Physical Therapy

## 2022-04-14 DIAGNOSIS — M542 Cervicalgia: Secondary | ICD-10-CM

## 2022-04-14 DIAGNOSIS — M6281 Muscle weakness (generalized): Secondary | ICD-10-CM | POA: Diagnosis not present

## 2022-04-14 DIAGNOSIS — Z1231 Encounter for screening mammogram for malignant neoplasm of breast: Secondary | ICD-10-CM

## 2022-04-14 DIAGNOSIS — G8929 Other chronic pain: Secondary | ICD-10-CM | POA: Diagnosis not present

## 2022-04-14 DIAGNOSIS — R519 Headache, unspecified: Secondary | ICD-10-CM | POA: Diagnosis not present

## 2022-04-14 NOTE — Therapy (Signed)
OUTPATIENT PHYSICAL THERAPY CERVICAL TREATMENT   Patient Name: Veronica Galvan MRN: [id number] DOB:07-22-1955, 66 y.o., female Today's Date: 04/14/2022    Past Medical History:  Diagnosis Date   Allergy    Anxiety    Arthritis    hands   Cataract    Diabetes mellitus without complication ([place])    type 2   Family history of adverse reaction to anesthesia    son has malignant hyperthermia, daughter does not daughter recently had c section without problems   GERD (gastroesophageal reflux disease)    Headache    sinus   Hyperlipidemia    Hypertension    PONV (postoperative nausea and vomiting)    nausea only   Ulcer, stomach peptic yrs ago   Past Surgical History:  Procedure Laterality Date   APPENDECTOMY     both hells bone spur repair     both heels with metal clips   both shoulder rotator cuff repair     CESAREAN SECTION     x 1   COLONOSCOPY WITH PROPOFOL N/A 09/07/2016   Procedure: COLONOSCOPY WITH PROPOFOL;  Surgeon: Garlan Fair, MD;  Location: [facility];  Service: Endoscopy;  Laterality: N/A;   colonscopy  06/2011   polyps   EYE SURGERY     FRACTURE SURGERY     TUBAL LIGATION     VESICO-VAGINAL FISTULA REPAIR     Patient Active Problem List   Diagnosis Date Noted   Gastroesophageal reflux disease 11/16/2021   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 08/18/2021   Dermatofibroma 08/18/2021   History of malignant neoplasm of skin 08/18/2021   Lentigo 08/18/2021   Melanocytic nevi of trunk 08/18/2021   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   Rosacea 08/18/2021   Actinic keratosis 08/18/2021   Sensorineural hearing loss (SNHL) of left ear with unrestricted hearing of right ear 07/26/2021   Tinnitus of left ear 07/26/2021   Acute recurrent maxillary sinusitis 06/22/2021   Primary hypertension 01/12/2021   Hyperlipidemia associated with type 2 diabetes mellitus ([place]) 01/12/2021   Arthritis 01/12/2021   Type II  diabetes mellitus ([place]) 11/25/2019   Ingrown toenail 09/05/2018   Neck pain 01/29/2018   Tick bite 10/24/2017    PCP: Jodi Mourning  REFERRING PROVIDER: Genia Harold  REFERRING DIAG: M54.2  THERAPY DIAG:  Cervicalgia  Muscle weakness (generalized)  Chronic intractable headache, unspecified headache type  Rationale for Evaluation and Treatment Rehabilitation  ONSET DATE: 11/08/21  SUBJECTIVE:  SUBJECTIVE STATEMENT: Comes in today with increased HA and neck pain, "doing too much" PERTINENT HISTORY:  Arthritis, HTN, bilateral rotator cuff surgery 15-88yr ago, DM  PAIN:  Are you having pain? Yes: NPRS scale: 7/10 Pain location: head and neck, shoulders  Pain description: tight  Aggravating factors: moving Relieving factors: massage, standing in the shower  WEIGHT BEARING RESTRICTIONS No  FALLS:  Has patient fallen in last 6 months? No  LIVING ENVIRONMENT: Lives with: lives alone Lives in: House/apartment Stairs: Yes: External: 3 steps; can reach both Has following equipment at home: None  OCCUPATION: reitred  PLOF: Independent  PATIENT GOALS to loosen it up, find out what is causing my headaches   OBJECTIVE:   DIAGNOSTIC FINDINGS:   FINDINGS: Brain: No acute infarct, mass effect or extra-axial collection. No acute or chronic hemorrhage. Normal white matter signal, parenchymal volume and CSF spaces. The midline structures are normal.  Skull and upper cervical spine: Normal calvarium and skull base. Visualized upper cervical spine and soft tissues are normal.   Sinuses/Orbits:No paranasal sinus fluid levels or advanced mucosal thickening. No mastoid or middle ear effusion. Normal orbits.   IMPRESSION: Normal brain MRI.  PATIENT SURVEYS:  FOTO 53/100 ,   April 08, 2022 has been up to 653and today was 53but has been doing a lot and lifting and is back doing much more than she was   COGNITION: Overall cognitive status: Within functional limits for tasks assessed  SENSATION: WFL  POSTURE: rounded shoulders, forward head, and increased thoracic kyphosis  PALPATION: TTP and sore along upper trap and cervical paraspinals, trigger points in R and L UT   CERVICAL ROM:   Active ROM A/PROM (deg) eval 12/30/21 01/05/22 9/28 10/31 04/05/22  Flexion 100% no pain WFL WFL WNL   WNL  Extension [facility] W[facility]   WNL  Right lateral flexion Mod limitation w/pain Mild limitations Mild limitations Severe limitation, leans with trunk  Decreased 75%  Decreased 50%  Left lateral flexion Mod limitation w/pain Mod limitations Mild limitations Severe limitation, leans with trunk  Decreased 75% Decreased  50%  Right rotation 75% w/pain Mild limitation w/pain WFL pain at end range  Mild limitation  Decreased 25%  Decreased 25%  Left rotation 75% w/pain  Mild limitation w/pain  WFL pain at end range  Mild limitation  Decreased 25% Decreased  25%   (Blank rows = not tested)  UPPER EXTREMITY ROM: WFL, limited in IR and ER due to prior RC surgery years ago   UPPER EXTREMITY MMT:  MMT Right eval Left eval Right 01/05/22 Left 01/05/22 R 9/28 L 9/28  Shoulder flexion 4+ 4+ _0 Shoulder extension        Shoulder abduction 4+ very painful 4+ 4+ with pain _1 Shoulder adduction        Shoulder extension        Shoulder internal rotation 4+ 4+ _2 Shoulder external rotation     5 5  Middle trapezius        Lower trapezius        Elbow flexion 5 5      Elbow extension 5 5      Wrist flexion        Wrist extension        Wrist ulnar deviation        Wrist radial deviation        Wrist pronation  Wrist supination        Grip strength         (Blank rows = not tested)   TODAY'S TREATMENT:  04/14/22 STM to the neck,  upper traps and the TMJ area Thoracic mobilization Occipital release  04/12/22 UBE level 4 x 6 minutes 15# straight arm pull 35# triceps AR press 15# 2x10 Seated row 25# 5# hip abduction and extension 25# lats 40# resisted gait all motions 50# leg press 4.4# ball at arms length walk for core activation   04/07/22 STM to the upper traps, cervical area, the rhomboids and occiput, TMJ and temporal area DN to the traps and occiput   04/05/22 UBE level 4 x 6 minutes Hip extension and abduction 5# 10# straight arm pulls 15# AR press Black tband abs and back 5# chest press 20# farmer carry Feet on ball trunk rotation, bridges Isometric abs  03/30/22 DN to the upper traps and the occipital area STM to the cervical area, the TMJ, upper trap, rhomboids and temples Passive stretch to the cervical area  03/29/22  UBE level 4 x 6 minutes  Seated row 20# Lats 20# Black tband lumbar extension 15# AR press 10# straight arm pulls 5# hip abduction and extension 2x10 each Leg press 40# 2x10 Feet on ball bridge Dead bug Partial sit ups    03-24-22 DN to the upper traps and the cervical area STM to the upper traps, rhomboids, cervical area Thoracic mobilizations Some gentle contract relax for cervical ROM    PATIENT EDUCATION:  Education details: POC Person educated: Patient Education method: Explanation Education comprehension: verbalized understanding   HOME EXERCISE PROGRAM: Access Code: [email or web address] URL: [email or web address] Date: 12/01/2021 Prepared by: Andris Baumann   ASSESSMENT:  CLINICAL IMPRESSION: Patient continues to be very active and again reports that she is having more HA and pain in the neck today, she seems to have less knot in the right upper trap but still very tender  REHAB POTENTIAL: Good  CLINICAL DECISION MAKING: Stable/uncomplicated  EVALUATION COMPLEXITY: Low   GOALS: Goals reviewed with patient? Yes  SHORT TERM GOALS:  Target date: 12/29/21  Patient will be independent with initial HEP.  Goal status: MET  LONG TERM GOALS: Target date: 02/26/22  Patient will be independent with advanced/ongoing HEP to improve outcomes and carryover.  Goal status: IN PROGRESS  2.  Patient will report 75% improvement in neck pain to improve QOL.   Baseline: 9/28- not even 50% improvement maybe 40% Goal status: IN PROGRESS  3.  Patient will demonstrate full pain free cervical ROM to complete ADLs and play with grandchildren with more ease. Goal status: IN PROGRESS 9/28- better but still limited (see above)  4.  Patient will demonstrate improved posture to decrease muscle imbalance and tightness.  Baseline: increased tightness along cervical paraspinals Goal status: IN PROGRESS 9/28- ongoing impaired posture   5.  Patient will report 71 on FOTO to demonstrate improved functional ability.  Baseline:  9/28- 64  Goal status: MET but has regressed as she is doing more and more  6.  Will demonstrate improved core strength as evidenced by passing double leg lower test  Baseline:  Goal status: progressing    PLAN: PT FREQUENCY: 2x/week  PT DURATION: 4 weeks  PLANNED INTERVENTIONS: Therapeutic exercises, Therapeutic activity, Neuromuscular re-education, Balance training, Gait training, Patient/Family education, Self Care, Joint mobilization, Dry Needling, Spinal manipulation, Spinal mobilization, Cryotherapy, Moist heat, Taping, Vasopneumatic device, Traction, Ionotophoresis 62m/ml Dexamethasone, and Manual therapy  PLAN FOR  NEXT SESSION: WE continue to work with Veronica Galvan progressing strength and function as she tolerates.  She overall is doing more and more compared to what she was able to do prior to starting PT and reports that she was not abe to do it before.  Lum Babe, PT 04/14/2022, 3:21 PM   [place] [place], [place], [address] Phone:  [phone]   Fax:  [phone]

## 2022-04-19 ENCOUNTER — Ambulatory Visit: Payer: Medicare Other | Admitting: Physical Therapy

## 2022-04-19 ENCOUNTER — Encounter: Payer: Self-pay | Admitting: Physical Therapy

## 2022-04-19 ENCOUNTER — Encounter: Payer: Self-pay | Admitting: Family

## 2022-04-19 DIAGNOSIS — M542 Cervicalgia: Secondary | ICD-10-CM

## 2022-04-19 DIAGNOSIS — G8929 Other chronic pain: Secondary | ICD-10-CM

## 2022-04-19 DIAGNOSIS — M6281 Muscle weakness (generalized): Secondary | ICD-10-CM | POA: Diagnosis not present

## 2022-04-19 DIAGNOSIS — R519 Headache, unspecified: Secondary | ICD-10-CM | POA: Diagnosis not present

## 2022-04-19 MED ORDER — SEMAGLUTIDE (2 MG/DOSE) 8 MG/3ML ~~LOC~~ SOPN: 2.0000 mg | PEN_INJECTOR | SUBCUTANEOUS | 0 refills | Status: DC

## 2022-04-19 NOTE — Therapy (Signed)
OUTPATIENT PHYSICAL THERAPY CERVICAL TREATMENT   Patient Name: Veronica Galvan MRN: [id number] DOB:19-Jul-1955, 66 y.o., female Today's Date: 04/19/2022    Past Medical History:  Diagnosis Date   Allergy    Anxiety    Arthritis    hands   Cataract    Diabetes mellitus without complication ([place])    type 2   Family history of adverse reaction to anesthesia    son has malignant hyperthermia, daughter does not daughter recently had c section without problems   GERD (gastroesophageal reflux disease)    Headache    sinus   Hyperlipidemia    Hypertension    PONV (postoperative nausea and vomiting)    nausea only   Ulcer, stomach peptic yrs ago   Past Surgical History:  Procedure Laterality Date   APPENDECTOMY     both hells bone spur repair     both heels with metal clips   both shoulder rotator cuff repair     CESAREAN SECTION     x 1   COLONOSCOPY WITH PROPOFOL N/A 09/07/2016   Procedure: COLONOSCOPY WITH PROPOFOL;  Surgeon: Garlan Fair, MD;  Location: [facility];  Service: Endoscopy;  Laterality: N/A;   colonscopy  06/2011   polyps   EYE SURGERY     FRACTURE SURGERY     TUBAL LIGATION     VESICO-VAGINAL FISTULA REPAIR     Patient Active Problem List   Diagnosis Date Noted   Gastroesophageal reflux disease 11/16/2021   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 08/18/2021   Dermatofibroma 08/18/2021   History of malignant neoplasm of skin 08/18/2021   Lentigo 08/18/2021   Melanocytic nevi of trunk 08/18/2021   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   Rosacea 08/18/2021   Actinic keratosis 08/18/2021   Sensorineural hearing loss (SNHL) of left ear with unrestricted hearing of right ear 07/26/2021   Tinnitus of left ear 07/26/2021   Acute recurrent maxillary sinusitis 06/22/2021   Primary hypertension 01/12/2021   Hyperlipidemia associated with type 2 diabetes mellitus ([place]) 01/12/2021   Arthritis 01/12/2021   Type II  diabetes mellitus ([place]) 11/25/2019   Ingrown toenail 09/05/2018   Neck pain 01/29/2018   Tick bite 10/24/2017    PCP: Jodi Mourning  REFERRING PROVIDER: Genia Harold  REFERRING DIAG: M54.2  THERAPY DIAG:  Cervicalgia  Muscle weakness (generalized)  Chronic intractable headache, unspecified headache type  Rationale for Evaluation and Treatment Rehabilitation  ONSET DATE: 11/08/21  SUBJECTIVE:  SUBJECTIVE STATEMENT: Still doing so much with all the cooking and baking and hurting some but so happy I can do it.  Some knee pain  PERTINENT HISTORY:  Arthritis, HTN, bilateral rotator cuff surgery 15-71yr ago, DM  PAIN:  Are you having pain? Yes: NPRS scale: 8/10 Pain location: head and neck, shoulders  Pain description: tight  Aggravating factors: moving Relieving factors: massage, standing in the shower  WEIGHT BEARING RESTRICTIONS No  FALLS:  Has patient fallen in last 6 months? No  LIVING ENVIRONMENT: Lives with: lives alone Lives in: House/apartment Stairs: Yes: External: 3 steps; can reach both Has following equipment at home: None  OCCUPATION: reitred  PLOF: Independent  PATIENT GOALS to loosen it up, find out what is causing my headaches   OBJECTIVE:   DIAGNOSTIC FINDINGS:   FINDINGS: Brain: No acute infarct, mass effect or extra-axial collection. No acute or chronic hemorrhage. Normal white matter signal, parenchymal volume and CSF spaces. The midline structures are normal.  Skull and upper cervical spine: Normal calvarium and skull base. Visualized upper cervical spine and soft tissues are normal.   Sinuses/Orbits:No paranasal sinus fluid levels or advanced mucosal thickening. No mastoid or middle ear effusion. Normal orbits.   IMPRESSION: Normal  brain MRI.  PATIENT SURVEYS:  FOTO 53/100 ,  April 08, 2022 has been up to 674and today was 535but has been doing a lot and lifting and is back doing much more than she was   COGNITION: Overall cognitive status: Within functional limits for tasks assessed  SENSATION: WFL  POSTURE: rounded shoulders, forward head, and increased thoracic kyphosis  PALPATION: TTP and sore along upper trap and cervical paraspinals, trigger points in R and L UT   CERVICAL ROM:   Active ROM A/PROM (deg) eval 12/30/21 01/05/22 9/28 10/31 04/05/22  Flexion 100% no pain WFL WFL WNL   WNL  [facility]   WNL  Right lateral flexion Mod limitation w/pain Mild limitations Mild limitations Severe limitation, leans with trunk  Decreased 75%  Decreased 50%  Left lateral flexion Mod limitation w/pain Mod limitations Mild limitations Severe limitation, leans with trunk  Decreased 75% Decreased  50%  Right rotation 75% w/pain Mild limitation w/pain WFL pain at end range  Mild limitation  Decreased 25%  Decreased 25%  Left rotation 75% w/pain  Mild limitation w/pain  WFL pain at end range  Mild limitation  Decreased 25% Decreased  25%   (Blank rows = not tested)  UPPER EXTREMITY ROM: WFL, limited in IR and ER due to prior RC surgery years ago   UPPER EXTREMITY MMT:  MMT Right eval Left eval Right 01/05/22 Left 01/05/22 R 9/28 L 9/28  Shoulder flexion 4+ 4+ _0 Shoulder extension        Shoulder abduction 4+ very painful 4+ 4+ with pain _1 Shoulder adduction        Shoulder extension        Shoulder internal rotation 4+ 4+ _2 Shoulder external rotation     5 5  Middle trapezius        Lower trapezius        Elbow flexion 5 5      Elbow extension 5 5      Wrist flexion        Wrist extension        Wrist ulnar deviation  Wrist radial deviation        Wrist pronation        Wrist supination        Grip strength         (Blank rows = not  tested)   TODAY'S TREATMENT:  04/19/22 Nustep Level 5 x 6 minutes Leg curls 25# 2x10 Seated row 25# 2x10 Lats 25# 2x10 Leg press 40# 3x10 Straight arm extension cues for core AR press 15# Weighted ball at arms length walking  04/14/22 STM to the neck, upper traps and the TMJ area Thoracic mobilization Occipital release  04/12/22 UBE level 4 x 6 minutes 15# straight arm pull 35# triceps AR press 15# 2x10 Seated row 25# 5# hip abduction and extension 25# lats 40# resisted gait all motions 50# leg press 4.4# ball at arms length walk for core activation   04/07/22 STM to the upper traps, cervical area, the rhomboids and occiput, TMJ and temporal area DN to the traps and occiput   04/05/22 UBE level 4 x 6 minutes Hip extension and abduction 5# 10# straight arm pulls 15# AR press Black tband abs and back 5# chest press 20# farmer carry Feet on ball trunk rotation, bridges Isometric abs  03/30/22 DN to the upper traps and the occipital area STM to the cervical area, the TMJ, upper trap, rhomboids and temples Passive stretch to the cervical area  03/29/22  UBE level 4 x 6 minutes  Seated row 20# Lats 20# Black tband lumbar extension 15# AR press 10# straight arm pulls 5# hip abduction and extension 2x10 each Leg press 40# 2x10 Feet on ball bridge Dead bug Partial sit ups    13-Apr-2022 DN to the upper traps and the cervical area STM to the upper traps, rhomboids, cervical area Thoracic mobilizations Some gentle contract relax for cervical ROM    PATIENT EDUCATION:  Education details: POC Person educated: Patient Education method: Explanation Education comprehension: verbalized understanding   HOME EXERCISE PROGRAM: Access Code: [email or web address] URL: [email or web address] Date: 12/01/2021 Prepared by: Andris Baumann   ASSESSMENT:  CLINICAL IMPRESSION: Patient continues to be very active , we are still doing our work one day and treat the  next, she is working her core well but still trouble activating REHAB POTENTIAL: Good  CLINICAL DECISION MAKING: Stable/uncomplicated  EVALUATION COMPLEXITY: Low   GOALS: Goals reviewed with patient? Yes  SHORT TERM GOALS: Target date: 12/29/21  Patient will be independent with initial HEP.  Goal status: MET  LONG TERM GOALS: Target date: 02/26/22  Patient will be independent with advanced/ongoing HEP to improve outcomes and carryover.  Goal status: IN PROGRESS  2.  Patient will report 75% improvement in neck pain to improve QOL.   Baseline: 9/28- not even 50% improvement maybe 40% Goal status: IN PROGRESS  3.  Patient will demonstrate full pain free cervical ROM to complete ADLs and play with grandchildren with more ease. Goal status: IN PROGRESS 9/28- better but still limited (see above)  4.  Patient will demonstrate improved posture to decrease muscle imbalance and tightness.  Baseline: increased tightness along cervical paraspinals Goal status: IN PROGRESS 9/28- ongoing impaired posture   5.  Patient will report 50 on FOTO to demonstrate improved functional ability.  Baseline:  9/28- 64  Goal status: MET but has regressed as she is doing more and more  6.  Will demonstrate improved core strength as evidenced by passing double leg lower test  Baseline:  Goal status: progressing    PLAN:  PT FREQUENCY: 2x/week  PT DURATION: 4 weeks  PLANNED INTERVENTIONS: Therapeutic exercises, Therapeutic activity, Neuromuscular re-education, Balance training, Gait training, Patient/Family education, Self Care, Joint mobilization, Dry Needling, Spinal manipulation, Spinal mobilization, Cryotherapy, Moist heat, Taping, Vasopneumatic device, Traction, Ionotophoresis 44m/ml Dexamethasone, and Manual therapy  PLAN FOR NEXT SESSION: WE continue to work with KJuliann Pulseprogressing strength and function as she tolerates.  She overall is doing more and more compared to what she was able to do  prior to starting PT and reports that she was not abe to do it before.  MLum Babe PT 04/19/2022, 2:02 PM   [place] [address]Phone: [phone]  Fax:  [phone]

## 2022-04-21 ENCOUNTER — Ambulatory Visit: Payer: Medicare Other | Admitting: Physical Therapy

## 2022-04-21 ENCOUNTER — Encounter: Payer: Self-pay | Admitting: Physical Therapy

## 2022-04-21 DIAGNOSIS — R519 Headache, unspecified: Secondary | ICD-10-CM | POA: Diagnosis not present

## 2022-04-21 DIAGNOSIS — G8929 Other chronic pain: Secondary | ICD-10-CM

## 2022-04-21 DIAGNOSIS — M542 Cervicalgia: Secondary | ICD-10-CM

## 2022-04-21 DIAGNOSIS — M6281 Muscle weakness (generalized): Secondary | ICD-10-CM | POA: Diagnosis not present

## 2022-04-21 NOTE — Therapy (Signed)
OUTPATIENT PHYSICAL THERAPY CERVICAL TREATMENT   Patient Name: Veronica Galvan MRN: [id number] DOB:1955-08-10, 66 y.o., female Today's Date: 04/21/2022    Past Medical History:  Diagnosis Date   Allergy    Anxiety    Arthritis    hands   Cataract    Diabetes mellitus without complication ([place])    type 2   Family history of adverse reaction to anesthesia    son has malignant hyperthermia, daughter does not daughter recently had c section without problems   GERD (gastroesophageal reflux disease)    Headache    sinus   Hyperlipidemia    Hypertension    PONV (postoperative nausea and vomiting)    nausea only   Ulcer, stomach peptic yrs ago   Past Surgical History:  Procedure Laterality Date   APPENDECTOMY     both hells bone spur repair     both heels with metal clips   both shoulder rotator cuff repair     CESAREAN SECTION     x 1   COLONOSCOPY WITH PROPOFOL N/A 09/07/2016   Procedure: COLONOSCOPY WITH PROPOFOL;  Surgeon: Garlan Fair, MD;  Location: [facility];  Service: Endoscopy;  Laterality: N/A;   colonscopy  06/2011   polyps   EYE SURGERY     FRACTURE SURGERY     TUBAL LIGATION     VESICO-VAGINAL FISTULA REPAIR     Patient Active Problem List   Diagnosis Date Noted   Gastroesophageal reflux disease 11/16/2021   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 08/18/2021   Dermatofibroma 08/18/2021   History of malignant neoplasm of skin 08/18/2021   Lentigo 08/18/2021   Melanocytic nevi of trunk 08/18/2021   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   Rosacea 08/18/2021   Actinic keratosis 08/18/2021   Sensorineural hearing loss (SNHL) of left ear with unrestricted hearing of right ear 07/26/2021   Tinnitus of left ear 07/26/2021   Acute recurrent maxillary sinusitis 06/22/2021   Primary hypertension 01/12/2021   Hyperlipidemia associated with type 2 diabetes mellitus ([place]) 01/12/2021   Arthritis 01/12/2021   Type II  diabetes mellitus ([place]) 11/25/2019   Ingrown toenail 09/05/2018   Neck pain 01/29/2018   Tick bite 10/24/2017    PCP: Jodi Mourning  REFERRING PROVIDER: Genia Harold  REFERRING DIAG: M54.2  THERAPY DIAG:  Cervicalgia  Muscle weakness (generalized)  Chronic intractable headache, unspecified headache type  Rationale for Evaluation and Treatment Rehabilitation  ONSET DATE: 11/08/21  SUBJECTIVE:  SUBJECTIVE STATEMENT: Patient reports that she is hurting more with the activities she is doing  PERTINENT HISTORY:  Arthritis, HTN, bilateral rotator cuff surgery 15-59yr ago, DM  PAIN:  Are you having pain? Yes: NPRS scale: 8/10 Pain location: head and neck, shoulders  Pain description: tight  Aggravating factors: moving Relieving factors: massage, standing in the shower  WEIGHT BEARING RESTRICTIONS No  FALLS:  Has patient fallen in last 6 months? No  LIVING ENVIRONMENT: Lives with: lives alone Lives in: House/apartment Stairs: Yes: External: 3 steps; can reach both Has following equipment at home: None  OCCUPATION: reitred  PLOF: Independent  PATIENT GOALS to loosen it up, find out what is causing my headaches   OBJECTIVE:   DIAGNOSTIC FINDINGS:   FINDINGS: Brain: No acute infarct, mass effect or extra-axial collection. No acute or chronic hemorrhage. Normal white matter signal, parenchymal volume and CSF spaces. The midline structures are normal.  Skull and upper cervical spine: Normal calvarium and skull base. Visualized upper cervical spine and soft tissues are normal.   Sinuses/Orbits:No paranasal sinus fluid levels or advanced mucosal thickening. No mastoid or middle ear effusion. Normal orbits.   IMPRESSION: Normal brain MRI.  PATIENT SURVEYS:  FOTO  53/100 ,  April 08, 2022 has been up to 649and today was 522but has been doing a lot and lifting and is back doing much more than she was   COGNITION: Overall cognitive status: Within functional limits for tasks assessed  SENSATION: WFL  POSTURE: rounded shoulders, forward head, and increased thoracic kyphosis  PALPATION: TTP and sore along upper trap and cervical paraspinals, trigger points in R and L UT   CERVICAL ROM:   Active ROM A/PROM (deg) eval 12/30/21 01/05/22 9/28 10/31 04/05/22  Flexion 100% no pain WFL WFL WNL   WNL  Extension [facility] W[facility]   WNL  Right lateral flexion Mod limitation w/pain Mild limitations Mild limitations Severe limitation, leans with trunk  Decreased 75%  Decreased 50%  Left lateral flexion Mod limitation w/pain Mod limitations Mild limitations Severe limitation, leans with trunk  Decreased 75% Decreased  50%  Right rotation 75% w/pain Mild limitation w/pain WFL pain at end range  Mild limitation  Decreased 25%  Decreased 25%  Left rotation 75% w/pain  Mild limitation w/pain  WFL pain at end range  Mild limitation  Decreased 25% Decreased  25%   (Blank rows = not tested)  UPPER EXTREMITY ROM: WFL, limited in IR and ER due to prior RC surgery years ago   UPPER EXTREMITY MMT:  MMT Right eval Left eval Right 01/05/22 Left 01/05/22 R 9/28 L 9/28  Shoulder flexion 4+ 4+ _0 Shoulder extension        Shoulder abduction 4+ very painful 4+ 4+ with pain _1 Shoulder adduction        Shoulder extension        Shoulder internal rotation 4+ 4+ _2 Shoulder external rotation     5 5  Middle trapezius        Lower trapezius        Elbow flexion 5 5      Elbow extension 5 5      Wrist flexion        Wrist extension        Wrist ulnar deviation        Wrist radial deviation  Wrist pronation        Wrist supination        Grip strength         (Blank rows = not tested)   TODAY'S TREATMENT:  04/21/22 STM  with some PROM of the neck and shoulders Use of vibration tot he shoulders, upper traps and rhomboids   04/19/22 Nustep Level 5 x 6 minutes Leg curls 25# 2x10 Seated row 25# 2x10 Lats 25# 2x10 Leg press 40# 3x10 Straight arm extension cues for core AR press 15# Weighted ball at arms length walking  04/14/22 STM to the neck, upper traps and the TMJ area Thoracic mobilization Occipital release  04/12/22 UBE level 4 x 6 minutes 15# straight arm pull 35# triceps AR press 15# 2x10 Seated row 25# 5# hip abduction and extension 25# lats 40# resisted gait all motions 50# leg press 4.4# ball at arms length walk for core activation   04/07/22 STM to the upper traps, cervical area, the rhomboids and occiput, TMJ and temporal area DN to the traps and occiput   04/05/22 UBE level 4 x 6 minutes Hip extension and abduction 5# 10# straight arm pulls 15# AR press Black tband abs and back 5# chest press 20# farmer carry Feet on ball trunk rotation, bridges Isometric abs  03/30/22 DN to the upper traps and the occipital area STM to the cervical area, the TMJ, upper trap, rhomboids and temples Passive stretch to the cervical area    PATIENT EDUCATION:  Education details: POC Person educated: Patient Education method: Explanation Education comprehension: verbalized understanding   HOME EXERCISE PROGRAM: Access Code: [email or web address] URL: [email or web address] Date: 12/01/2021 Prepared by: Andris Baumann   ASSESSMENT:  CLINICAL IMPRESSION: Patient continues to be very [place] has been having some increased pain and tightness with knots, the knots are now palpable instead of just the giant spasm REHAB POTENTIAL: Good  CLINICAL DECISION MAKING: Stable/uncomplicated  EVALUATION COMPLEXITY: Low   GOALS: Goals reviewed with patient? Yes  SHORT TERM GOALS: Target date: 12/29/21  Patient will be independent with initial HEP.  Goal status: MET  LONG TERM  GOALS: Target date: 02/26/22  Patient will be independent with advanced/ongoing HEP to improve outcomes and carryover.  Goal status: IN PROGRESS  2.  Patient will report 75% improvement in neck pain to improve QOL.   Baseline: 9/28- not even 50% improvement maybe 40% Goal status: IN PROGRESS  3.  Patient will demonstrate full pain free cervical ROM to complete ADLs and play with grandchildren with more ease. Goal status: IN PROGRESS 9/28- better but still limited (see above)  4.  Patient will demonstrate improved posture to decrease muscle imbalance and tightness.  Baseline: increased tightness along cervical paraspinals Goal status: IN PROGRESS 9/28- ongoing impaired posture   5.  Patient will report 76 on FOTO to demonstrate improved functional ability.  Baseline:  9/28- 64  Goal status: MET but has regressed as she is doing more and more  6.  Will demonstrate improved core strength as evidenced by passing double leg lower test  Baseline:  Goal status: progressing    PLAN: PT FREQUENCY: 2x/week  PT DURATION: 4 weeks  PLANNED INTERVENTIONS: Therapeutic exercises, Therapeutic activity, Neuromuscular re-education, Balance training, Gait training, Patient/Family education, Self Care, Joint mobilization, Dry Needling, Spinal manipulation, Spinal mobilization, Cryotherapy, Moist heat, Taping, Vasopneumatic device, Traction, Ionotophoresis 24m/ml Dexamethasone, and Manual therapy  PLAN FOR NEXT SESSION: WE continue to work with KJuliann Pulseprogressing strength and function as she tolerates.  She  overall is doing more and more compared to what she was able to do prior to starting PT and reports that she was not abe to do it before.  Lum Babe, PT 04/21/2022, 4:01 PM   [address] [place], [place], [address] Phone: [phone]   Fax:  [phone]

## 2022-04-26 ENCOUNTER — Encounter: Payer: Self-pay | Admitting: Physical Therapy

## 2022-04-26 ENCOUNTER — Ambulatory Visit: Payer: Medicare Other | Admitting: Physical Therapy

## 2022-04-26 DIAGNOSIS — M6281 Muscle weakness (generalized): Secondary | ICD-10-CM | POA: Diagnosis not present

## 2022-04-26 DIAGNOSIS — G8929 Other chronic pain: Secondary | ICD-10-CM | POA: Diagnosis not present

## 2022-04-26 DIAGNOSIS — M542 Cervicalgia: Secondary | ICD-10-CM

## 2022-04-26 DIAGNOSIS — R519 Headache, unspecified: Secondary | ICD-10-CM | POA: Diagnosis not present

## 2022-04-26 NOTE — Therapy (Signed)
OUTPATIENT PHYSICAL THERAPY CERVICAL TREATMENT Progress Note Reporting Period 03/17/22 to 04/26/22 for visits 31-40  See note below for Objective Data and Assessment of Progress/Goals.      Patient Name: Veronica Galvan MRN: [id number] DOB:August 22, 1955, 66 y.o., female Today's Date: 04/26/2022    Past Medical History:  Diagnosis Date   Allergy    Anxiety    Arthritis    hands   Cataract    Diabetes mellitus without complication ([place])    type 2   Family history of adverse reaction to anesthesia    son has malignant hyperthermia, daughter does not daughter recently had c section without problems   GERD (gastroesophageal reflux disease)    Headache    sinus   Hyperlipidemia    Hypertension    PONV (postoperative nausea and vomiting)    nausea only   Ulcer, stomach peptic yrs ago   Past Surgical History:  Procedure Laterality Date   APPENDECTOMY     both hells bone spur repair     both heels with metal clips   both shoulder rotator cuff repair     CESAREAN SECTION     x 1   COLONOSCOPY WITH PROPOFOL N/A 09/07/2016   Procedure: COLONOSCOPY WITH PROPOFOL;  Surgeon: Garlan Fair, MD;  Location: [facility];  Service: Endoscopy;  Laterality: N/A;   colonscopy  06/2011   polyps   EYE SURGERY     FRACTURE SURGERY     TUBAL LIGATION     VESICO-VAGINAL FISTULA REPAIR     Patient Active Problem List   Diagnosis Date Noted   Gastroesophageal reflux disease 11/16/2021   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 08/18/2021   Dermatofibroma 08/18/2021   History of malignant neoplasm of skin 08/18/2021   Lentigo 08/18/2021   Melanocytic nevi of trunk 08/18/2021   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   Rosacea 08/18/2021   Actinic keratosis 08/18/2021   Sensorineural hearing loss (SNHL) of left ear with unrestricted hearing of right ear 07/26/2021   Tinnitus of left ear 07/26/2021   Acute recurrent maxillary sinusitis 06/22/2021    Primary hypertension 01/12/2021   Hyperlipidemia associated with type 2 diabetes mellitus ([place]) 01/12/2021   Arthritis 01/12/2021   Type II diabetes mellitus ([place]) 11/25/2019   Ingrown toenail 09/05/2018   Neck pain 01/29/2018   Tick bite 10/24/2017    PCP: Jodi Mourning  REFERRING PROVIDER: Genia Harold  REFERRING DIAG: M54.2  THERAPY DIAG:  Cervicalgia  Muscle weakness (generalized)  Chronic intractable headache, unspecified headache type  Rationale for Evaluation and Treatment Rehabilitation  ONSET DATE: 11/08/21  SUBJECTIVE:  SUBJECTIVE STATEMENT: Patient reports increased pain and fatigue all over, she had to care for a puppy as a christmas gift and reports a lack of sleep and fatigue from just going, and going and going  PERTINENT HISTORY:  Arthritis, HTN, bilateral rotator cuff surgery 15-50yr ago, DM  PAIN:  Are you having pain? Yes: NPRS scale: 8/10 Pain location: head and neck, shoulders  Pain description: tight  Aggravating factors: moving Relieving factors: massage, standing in the shower  WEIGHT BEARING RESTRICTIONS No  FALLS:  Has patient fallen in last 6 months? No  LIVING ENVIRONMENT: Lives with: lives alone Lives in: House/apartment Stairs: Yes: External: 3 steps; can reach both Has following equipment at home: None  OCCUPATION: reitred  PLOF: Independent  PATIENT GOALS to loosen it up, find out what is causing my headaches   OBJECTIVE:   DIAGNOSTIC FINDINGS:   FINDINGS: Brain: No acute infarct, mass effect or extra-axial collection. No acute or chronic hemorrhage. Normal white matter signal, parenchymal volume and CSF spaces. The midline structures are normal.  Skull and upper cervical spine: Normal calvarium and skull base. Visualized  upper cervical spine and soft tissues are normal.   Sinuses/Orbits:No paranasal sinus fluid levels or advanced mucosal thickening. No mastoid or middle ear effusion. Normal orbits.   IMPRESSION: Normal brain MRI.  PATIENT SURVEYS:  FOTO 53/100 ,  April 08, 2022 has been up to 679and today was 520but has been doing a lot and lifting and is back doing much more than she was   COGNITION: Overall cognitive status: Within functional limits for tasks assessed  SENSATION: WFL  POSTURE: rounded shoulders, forward head, and increased thoracic kyphosis  PALPATION: TTP and sore along upper trap and cervical paraspinals, trigger points in R and L UT   CERVICAL ROM:   Active ROM A/PROM (deg) eval 12/30/21 01/05/22 9/28 10/31 04/05/22  Flexion 100% no pain WFL WFL WNL   WNL  [facility] W[facility]   WNL  Right lateral flexion Mod limitation w/pain Mild limitations Mild limitations Severe limitation, leans with trunk  Decreased 75%  Decreased 50%  Left lateral flexion Mod limitation w/pain Mod limitations Mild limitations Severe limitation, leans with trunk  Decreased 75% Decreased  50%  Right rotation 75% w/pain Mild limitation w/pain WFL pain at end range  Mild limitation  Decreased 25%  Decreased 25%  Left rotation 75% w/pain  Mild limitation w/pain  WFL pain at end range  Mild limitation  Decreased 25% Decreased  25%   (Blank rows = not tested)  UPPER EXTREMITY ROM: WFL, limited in IR and ER due to prior RC surgery years ago   UPPER EXTREMITY MMT:  MMT Right eval Left eval Right 01/05/22 Left 01/05/22 R 9/28 L 9/28  Shoulder flexion 4+ 4+ _0 Shoulder extension        Shoulder abduction 4+ very painful 4+ 4+ with pain _1 Shoulder adduction        Shoulder extension        Shoulder internal rotation 4+ 4+ _2 Shoulder external rotation     5 5  Middle trapezius        Lower trapezius        Elbow flexion 5 5      Elbow extension 5 5       Wrist flexion        Wrist extension  Wrist ulnar deviation        Wrist radial deviation        Wrist pronation        Wrist supination        Grip strength         (Blank rows = not tested)   TODAY'S TREATMENT:  04/26/22 STM tot he upper traps, the rhomboids and the neck Gentle cervical stretches MHP/IFC to the C/T area in sitting  04/21/22 STM with some PROM of the neck and shoulders Use of vibration tot he shoulders, upper traps and rhomboids   04/19/22 Nustep Level 5 x 6 minutes Leg curls 25# 2x10 Seated row 25# 2x10 Lats 25# 2x10 Leg press 40# 3x10 Straight arm extension cues for core AR press 15# Weighted ball at arms length walking  04/14/22 STM to the neck, upper traps and the TMJ area Thoracic mobilization Occipital release  04/12/22 UBE level 4 x 6 minutes 15# straight arm pull 35# triceps AR press 15# 2x10 Seated row 25# 5# hip abduction and extension 25# lats 40# resisted gait all motions 50# leg press 4.4# ball at arms length walk for core activation   04/07/22 STM to the upper traps, cervical area, the rhomboids and occiput, TMJ and temporal area DN to the traps and occiput   04/05/22 UBE level 4 x 6 minutes Hip extension and abduction 5# 10# straight arm pulls 15# AR press Black tband abs and back 5# chest press 20# farmer carry Feet on ball trunk rotation, bridges Isometric abs  03/30/22 DN to the upper traps and the occipital area STM to the cervical area, the TMJ, upper trap, rhomboids and temples Passive stretch to the cervical area    PATIENT EDUCATION:  Education details: POC Person educated: Patient Education method: Explanation Education comprehension: verbalized understanding   HOME EXERCISE PROGRAM: Access Code: [email or web address] URL: [email or web address] Date: 12/01/2021 Prepared by: Andris Baumann   ASSESSMENT:  CLINICAL IMPRESSION: Patient with increased pain and tightness and fatigue with the  holidays, she reports that she has had a lack of sleep and was non stop going for about 4-5 days, she was much stiffer and had more difficulty with movements and was very tight and sore, she does report that last year she could not do what she has been able to do this year  REHAB POTENTIAL: Good  CLINICAL DECISION MAKING: Stable/uncomplicated  EVALUATION COMPLEXITY: Low   GOALS: Goals reviewed with patient? Yes  SHORT TERM GOALS: Target date: 12/29/21  Patient will be independent with initial HEP.  Goal status: MET  LONG TERM GOALS: Target date: 02/26/22  Patient will be independent with advanced/ongoing HEP to improve outcomes and carryover.  Goal status: IN PROGRESS  2.  Patient will report 75% improvement in neck pain to improve QOL.   Baseline: 9/28- not even 50% improvement maybe 40% Goal status: IN PROGRESS  3.  Patient will demonstrate full pain free cervical ROM to complete ADLs and play with grandchildren with more ease. Goal status: IN PROGRESS 9/28- better but still limited (see above)  4.  Patient will demonstrate improved posture to decrease muscle imbalance and tightness.  Baseline: increased tightness along cervical paraspinals Goal status: IN PROGRESS 9/28- ongoing impaired posture   5.  Patient will report 17 on FOTO to demonstrate improved functional ability.  Baseline:  9/28- 64  Goal status: MET but has regressed as she is doing more and more  6.  Will demonstrate improved core strength as evidenced by passing  double leg lower test  Baseline:  Goal status: progressing    PLAN: PT FREQUENCY: 2x/week  PT DURATION: 4 weeks  PLANNED INTERVENTIONS: Therapeutic exercises, Therapeutic activity, Neuromuscular re-education, Balance training, Gait training, Patient/Family education, Self Care, Joint mobilization, Dry Needling, Spinal manipulation, Spinal mobilization, Cryotherapy, Moist heat, Taping, Vasopneumatic device, Traction, Ionotophoresis 61m/ml  Dexamethasone, and Manual therapy  PLAN FOR NEXT SESSION: WE continue to work with KJuliann Pulseprogressing strength and function as she tolerates.  She overall is doing more and more compared to what she was able to do prior to starting PT and reports that she was not abe to do it before.  MLum Babe PT 04/26/2022, 10:40 AM   [place] [address]Phone: [phone]  Fax:  [phone]

## 2022-04-28 ENCOUNTER — Encounter: Payer: Self-pay | Admitting: Physical Therapy

## 2022-04-28 ENCOUNTER — Ambulatory Visit: Payer: Medicare Other | Admitting: Physical Therapy

## 2022-04-28 DIAGNOSIS — M6281 Muscle weakness (generalized): Secondary | ICD-10-CM | POA: Diagnosis not present

## 2022-04-28 DIAGNOSIS — M542 Cervicalgia: Secondary | ICD-10-CM | POA: Diagnosis not present

## 2022-04-28 DIAGNOSIS — R519 Headache, unspecified: Secondary | ICD-10-CM | POA: Diagnosis not present

## 2022-04-28 DIAGNOSIS — G8929 Other chronic pain: Secondary | ICD-10-CM | POA: Diagnosis not present

## 2022-04-28 NOTE — Therapy (Signed)
OUTPATIENT PHYSICAL THERAPY CERVICAL TREATMENT    Patient Name: Veronica Galvan MRN: [id number] DOB:[birth year], 66 y.o., female Today's Date: 04/28/2022    Past Medical History:  Diagnosis Date   Allergy    Anxiety    Arthritis    hands   Cataract    Diabetes mellitus without complication ([place])    type 2   Family history of adverse reaction to anesthesia    son has malignant hyperthermia, daughter does not daughter recently had c section without problems   GERD (gastroesophageal reflux disease)    Headache    sinus   Hyperlipidemia    Hypertension    PONV (postoperative nausea and vomiting)    nausea only   Ulcer, stomach peptic yrs ago   Past Surgical History:  Procedure Laterality Date   APPENDECTOMY     both hells bone spur repair     both heels with metal clips   both shoulder rotator cuff repair     CESAREAN SECTION     x 1   COLONOSCOPY WITH PROPOFOL N/A 09/07/2016   Procedure: COLONOSCOPY WITH PROPOFOL;  Surgeon: Garlan Fair, MD;  Location: [facility];  Service: Endoscopy;  Laterality: N/A;   colonscopy  06/2011   polyps   EYE SURGERY     FRACTURE SURGERY     TUBAL LIGATION     VESICO-VAGINAL FISTULA REPAIR     Patient Active Problem List   Diagnosis Date Noted   Gastroesophageal reflux disease 11/16/2021   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 08/18/2021   Dermatofibroma 08/18/2021   History of malignant neoplasm of skin 08/18/2021   Lentigo 08/18/2021   Melanocytic nevi of trunk 08/18/2021   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   Rosacea 08/18/2021   Actinic keratosis 08/18/2021   Sensorineural hearing loss (SNHL) of left ear with unrestricted hearing of right ear 07/26/2021   Tinnitus of left ear 07/26/2021   Acute recurrent maxillary sinusitis 06/22/2021   Primary hypertension 01/12/2021   Hyperlipidemia associated with type 2 diabetes mellitus ([place]) 01/12/2021   Arthritis 01/12/2021   Type II  diabetes mellitus ([place]) 11/25/2019   Ingrown toenail 09/05/2018   Neck pain 01/29/2018   Tick bite 10/24/2017    PCP: Jodi Mourning  REFERRING PROVIDER: Genia Harold  REFERRING DIAG: M54.2  THERAPY DIAG:  Cervicalgia  Muscle weakness (generalized)  Chronic intractable headache, unspecified headache type  Rationale for Evaluation and Treatment Rehabilitation  ONSET DATE: 11/08/21  SUBJECTIVE:  SUBJECTIVE STATEMENT: Patient reports that she almost cancelled, she is not feeling well, she reports that on Tuesday they upped her dose of a medication but reports that she is not feeling well with this and has not eaten lately PERTINENT HISTORY:  Arthritis, HTN, bilateral rotator cuff surgery 15-86yr ago, DM  PAIN:  Are you having pain? Yes: NPRS scale: 8/10 Pain location: head and neck, shoulders  Pain description: tight  Aggravating factors: moving Relieving factors: massage, standing in the shower  WEIGHT BEARING RESTRICTIONS No  FALLS:  Has patient fallen in last 6 months? No  LIVING ENVIRONMENT: Lives with: lives alone Lives in: House/apartment Stairs: Yes: External: 3 steps; can reach both Has following equipment at home: None  OCCUPATION: reitred  PLOF: Independent  PATIENT GOALS to loosen it up, find out what is causing my headaches   OBJECTIVE:   DIAGNOSTIC FINDINGS:   FINDINGS: Brain: No acute infarct, mass effect or extra-axial collection. No acute or chronic hemorrhage. Normal white matter signal, parenchymal volume and CSF spaces. The midline structures are normal.  Skull and upper cervical spine: Normal calvarium and skull base. Visualized upper cervical spine and soft tissues are normal.   Sinuses/Orbits:No paranasal sinus fluid levels or advanced  mucosal thickening. No mastoid or middle ear effusion. Normal orbits.   IMPRESSION: Normal brain MRI.  PATIENT SURVEYS:  FOTO 53/100 ,  April 08, 2022 has been up to 663and today was 571but has been doing a lot and lifting and is back doing much more than she was   COGNITION: Overall cognitive status: Within functional limits for tasks assessed  SENSATION: WFL  POSTURE: rounded shoulders, forward head, and increased thoracic kyphosis  PALPATION: TTP and sore along upper trap and cervical paraspinals, trigger points in R and L UT   CERVICAL ROM:   Active ROM A/PROM (deg) eval 12/30/21 01/05/22 9/28 10/31 04/05/22  Flexion 100% no pain WFL WFL WNL   WNL  [facility] W[facility]   WNL  Right lateral flexion Mod limitation w/pain Mild limitations Mild limitations Severe limitation, leans with trunk  Decreased 75%  Decreased 50%  Left lateral flexion Mod limitation w/pain Mod limitations Mild limitations Severe limitation, leans with trunk  Decreased 75% Decreased  50%  Right rotation 75% w/pain Mild limitation w/pain WFL pain at end range  Mild limitation  Decreased 25%  Decreased 25%  Left rotation 75% w/pain  Mild limitation w/pain  WFL pain at end range  Mild limitation  Decreased 25% Decreased  25%   (Blank rows = not tested)  UPPER EXTREMITY ROM: WFL, limited in IR and ER due to prior RC surgery years ago   UPPER EXTREMITY MMT:  MMT Right eval Left eval Right 01/05/22 Left 01/05/22 R 9/28 L 9/28  Shoulder flexion 4+ 4+ _0 Shoulder extension        Shoulder abduction 4+ very painful 4+ 4+ with pain _1 Shoulder adduction        Shoulder extension        Shoulder internal rotation 4+ 4+ _2 Shoulder external rotation     5 5  Middle trapezius        Lower trapezius        Elbow flexion 5 5      Elbow extension 5 5      Wrist flexion        Wrist extension  Wrist ulnar deviation        Wrist radial deviation        Wrist  pronation        Wrist supination        Grip strength         (Blank rows = not tested)   TODAY'S TREATMENT:  04/28/22 Nustep level 5 x 5 minutes 15# seated row 15# lats 10# straight arm pulls cues for core and posture Leg press 20# 2x10 10# AR press Doorway stretch STM to the upper traps and into the rhomboids   04/26/22 STM tot he upper traps, the rhomboids and the neck Gentle cervical stretches MHP/IFC to the C/T area in sitting  04/21/22 STM with some PROM of the neck and shoulders Use of vibration tot he shoulders, upper traps and rhomboids   04/19/22 Nustep Level 5 x 6 minutes Leg curls 25# 2x10 Seated row 25# 2x10 Lats 25# 2x10 Leg press 40# 3x10 Straight arm extension cues for core AR press 15# Weighted ball at arms length walking  04/14/22 STM to the neck, upper traps and the TMJ area Thoracic mobilization Occipital release  04/12/22 UBE level 4 x 6 minutes 15# straight arm pull 35# triceps AR press 15# 2x10 Seated row 25# 5# hip abduction and extension 25# lats 40# resisted gait all motions 50# leg press 4.4# ball at arms length walk for core activation   04/07/22 STM to the upper traps, cervical area, the rhomboids and occiput, TMJ and temporal area DN to the traps and occiput   04/05/22 UBE level 4 x 6 minutes Hip extension and abduction 5# 10# straight arm pulls 15# AR press Black tband abs and back 5# chest press 20# farmer carry Feet on ball trunk rotation, bridges Isometric abs   PATIENT EDUCATION:  Education details: POC Person educated: Patient Education method: Explanation Education comprehension: verbalized understanding   HOME EXERCISE PROGRAM: Access Code: [email or web address] URL: [email or web address] Date: 12/01/2021 Prepared by: Andris Baumann   ASSESSMENT:  CLINICAL IMPRESSION: Patient not feeling well today, she reports possible increase in a medication is having some side effects.  She reports just  fatigued and almost cancelled today.  I decreased the weights and some of the reps to keep her moving, very tight in the traps  REHAB POTENTIAL: Good  CLINICAL DECISION MAKING: Stable/uncomplicated  EVALUATION COMPLEXITY: Low   GOALS: Goals reviewed with patient? Yes  SHORT TERM GOALS: Target date: 12/29/21  Patient will be independent with initial HEP.  Goal status: MET  LONG TERM GOALS: Target date: 02/26/22  Patient will be independent with advanced/ongoing HEP to improve outcomes and carryover.  Goal status: IN PROGRESS  2.  Patient will report 75% improvement in neck pain to improve QOL.   Baseline: 9/28- not even 50% improvement maybe 40% Goal status: IN PROGRESS  3.  Patient will demonstrate full pain free cervical ROM to complete ADLs and play with grandchildren with more ease. Goal status: IN PROGRESS 9/28- better but still limited (see above)  4.  Patient will demonstrate improved posture to decrease muscle imbalance and tightness.  Baseline: increased tightness along cervical paraspinals Goal status: IN PROGRESS 9/28- ongoing impaired posture   5.  Patient will report 56 on FOTO to demonstrate improved functional ability.  Baseline:  9/28- 64  Goal status: MET but has regressed as she is doing more and more  6.  Will demonstrate improved core strength as evidenced by passing double leg lower test  Baseline:  Goal  status: progressing    PLAN: PT FREQUENCY: 2x/week  PT DURATION: 4 weeks  PLANNED INTERVENTIONS: Therapeutic exercises, Therapeutic activity, Neuromuscular re-education, Balance training, Gait training, Patient/Family education, Self Care, Joint mobilization, Dry Needling, Spinal manipulation, Spinal mobilization, Cryotherapy, Moist heat, Taping, Vasopneumatic device, Traction, Ionotophoresis 63m/ml Dexamethasone, and Manual therapy  PLAN FOR NEXT SESSION: will see if she starts feeling better and get back on our normal strength and functional  activities  MLum Babe PT 04/28/2022, 8:44 AM   [place] [id number]: [phone]  Fax:  [phone]

## 2022-05-03 ENCOUNTER — Encounter: Payer: Self-pay | Admitting: Physical Therapy

## 2022-05-03 ENCOUNTER — Ambulatory Visit: Payer: Medicare Other | Attending: Psychiatry | Admitting: Physical Therapy

## 2022-05-03 DIAGNOSIS — M542 Cervicalgia: Secondary | ICD-10-CM | POA: Insufficient documentation

## 2022-05-03 DIAGNOSIS — G8929 Other chronic pain: Secondary | ICD-10-CM | POA: Insufficient documentation

## 2022-05-03 DIAGNOSIS — R519 Headache, unspecified: Secondary | ICD-10-CM | POA: Insufficient documentation

## 2022-05-03 DIAGNOSIS — M6281 Muscle weakness (generalized): Secondary | ICD-10-CM | POA: Diagnosis not present

## 2022-05-03 NOTE — Therapy (Signed)
OUTPATIENT PHYSICAL THERAPY CERVICAL TREATMENT    Patient Name: Veronica Galvan MRN: [id number] DOB:1956/04/06, 67 y.o., female Today's Date: 05/03/2022    Past Medical History:  Diagnosis Date   Allergy    Anxiety    Arthritis    hands   Cataract    Diabetes mellitus without complication ([place])    type 2   Family history of adverse reaction to anesthesia    son has malignant hyperthermia, daughter does not daughter recently had c section without problems   GERD (gastroesophageal reflux disease)    Headache    sinus   Hyperlipidemia    Hypertension    PONV (postoperative nausea and vomiting)    nausea only   Ulcer, stomach peptic yrs ago   Past Surgical History:  Procedure Laterality Date   APPENDECTOMY     both hells bone spur repair     both heels with metal clips   both shoulder rotator cuff repair     CESAREAN SECTION     x 1   COLONOSCOPY WITH PROPOFOL N/A 09/07/2016   Procedure: COLONOSCOPY WITH PROPOFOL;  Surgeon: Garlan Fair, MD;  Location: [facility];  Service: Endoscopy;  Laterality: N/A;   colonscopy  06/2011   polyps   EYE SURGERY     FRACTURE SURGERY     TUBAL LIGATION     VESICO-VAGINAL FISTULA REPAIR     Patient Active Problem List   Diagnosis Date Noted   Gastroesophageal reflux disease 11/16/2021   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 08/18/2021   Dermatofibroma 08/18/2021   History of malignant neoplasm of skin 08/18/2021   Lentigo 08/18/2021   Melanocytic nevi of trunk 08/18/2021   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   Rosacea 08/18/2021   Actinic keratosis 08/18/2021   Sensorineural hearing loss (SNHL) of left ear with unrestricted hearing of right ear 07/26/2021   Tinnitus of left ear 07/26/2021   Acute recurrent maxillary sinusitis 06/22/2021   Primary hypertension 01/12/2021   Hyperlipidemia associated with type 2 diabetes mellitus ([place]) 01/12/2021   Arthritis 01/12/2021   Type II  diabetes mellitus ([place]) 11/25/2019   Ingrown toenail 09/05/2018   Neck pain 01/29/2018   Tick bite 10/24/2017    PCP: Jodi Mourning  REFERRING PROVIDER: Genia Harold  REFERRING DIAG: M54.2  THERAPY DIAG:  Cervicalgia  Muscle weakness (generalized)  Chronic intractable headache, unspecified headache type  Rationale for Evaluation and Treatment Rehabilitation  ONSET DATE: 11/08/21  SUBJECTIVE:  SUBJECTIVE STATEMENT: Patient that she is feeling a little better but today is again when she takes the next dose of a medication  PERTINENT HISTORY:  Arthritis, HTN, bilateral rotator cuff surgery 15-11yr ago, DM  PAIN:  Are you having pain? Yes: NPRS scale: 7/10 Pain location: head and neck, shoulders  Pain description: tight  Aggravating factors: moving Relieving factors: massage, standing in the shower  WEIGHT BEARING RESTRICTIONS No  FALLS:  Has patient fallen in last 6 months? No  LIVING ENVIRONMENT: Lives with: lives alone Lives in: House/apartment Stairs: Yes: External: 3 steps; can reach both Has following equipment at home: None  OCCUPATION: reitred  PLOF: Independent  PATIENT GOALS to loosen it up, find out what is causing my headaches   OBJECTIVE:   DIAGNOSTIC FINDINGS:   FINDINGS: Brain: No acute infarct, mass effect or extra-axial collection. No acute or chronic hemorrhage. Normal white matter signal, parenchymal volume and CSF spaces. The midline structures are normal.  Skull and upper cervical spine: Normal calvarium and skull base. Visualized upper cervical spine and soft tissues are normal.   Sinuses/Orbits:No paranasal sinus fluid levels or advanced mucosal thickening. No mastoid or middle ear effusion. Normal orbits.   IMPRESSION: Normal brain  MRI.  PATIENT SURVEYS:  FOTO 53/100 ,  April 08, 2022 has been up to 634and today was 531but has been doing a lot and lifting and is back doing much more than she was   COGNITION: Overall cognitive status: Within functional limits for tasks assessed  SENSATION: WFL  POSTURE: rounded shoulders, forward head, and increased thoracic kyphosis  PALPATION: TTP and sore along upper trap and cervical paraspinals, trigger points in R and L UT   CERVICAL ROM:   Active ROM A/PROM (deg) eval 12/30/21 01/05/22 9/28 10/31 04/05/22  Flexion 100% no pain WFL WFL WNL   WNL  [facility] W[facility]   WNL  Right lateral flexion Mod limitation w/pain Mild limitations Mild limitations Severe limitation, leans with trunk  Decreased 75%  Decreased 50%  Left lateral flexion Mod limitation w/pain Mod limitations Mild limitations Severe limitation, leans with trunk  Decreased 75% Decreased  50%  Right rotation 75% w/pain Mild limitation w/pain WFL pain at end range  Mild limitation  Decreased 25%  Decreased 25%  Left rotation 75% w/pain  Mild limitation w/pain  WFL pain at end range  Mild limitation  Decreased 25% Decreased  25%   (Blank rows = not tested)  UPPER EXTREMITY ROM: WFL, limited in IR and ER due to prior RC surgery years ago   UPPER EXTREMITY MMT:  MMT Right eval Left eval Right 01/05/22 Left 01/05/22 R 9/28 L 9/28  Shoulder flexion 4+ 4+ _0 Shoulder extension        Shoulder abduction 4+ very painful 4+ 4+ with pain _1 Shoulder adduction        Shoulder extension        Shoulder internal rotation 4+ 4+ _2 Shoulder external rotation     5 5  Middle trapezius        Lower trapezius        Elbow flexion 5 5      Elbow extension 5 5      Wrist flexion        Wrist extension        Wrist ulnar deviation        Wrist radial  deviation        Wrist pronation        Wrist supination        Grip strength         (Blank rows = not  tested)   TODAY'S TREATMENT:  05/03/22 UBE level 3 x 6 minutes 10# straight arms 25# rows 25# lats 20# leg press 25# leg curls 5# leg extension Calf stretches Feet on ball K2C, trunk rotation, bridges, isometric abs HS and piriformis stretches Dead bug   05-28-2022 Nustep level 5 x 5 minutes 15# seated row 15# lats 10# straight arm pulls cues for core and posture Leg press 20# 2x10 10# AR press Doorway stretch STM to the upper traps and into the rhomboids   04/26/22 STM tot he upper traps, the rhomboids and the neck Gentle cervical stretches MHP/IFC to the C/T area in sitting  04/21/22 STM with some PROM of the neck and shoulders Use of vibration tot he shoulders, upper traps and rhomboids   04/19/22 Nustep Level 5 x 6 minutes Leg curls 25# 2x10 Seated row 25# 2x10 Lats 25# 2x10 Leg press 40# 3x10 Straight arm extension cues for core AR press 15# Weighted ball at arms length walking  04/14/22 STM to the neck, upper traps and the TMJ area Thoracic mobilization Occipital release  04/12/22 UBE level 4 x 6 minutes 15# straight arm pull 35# triceps AR press 15# 2x10 Seated row 25# 5# hip abduction and extension 25# lats 40# resisted gait all motions 50# leg press 4.4# ball at arms length walk for core activation   04/07/22 STM to the upper traps, cervical area, the rhomboids and occiput, TMJ and temporal area DN to the traps and occiput   04/05/22 UBE level 4 x 6 minutes Hip extension and abduction 5# 10# straight arm pulls 15# AR press Black tband abs and back 5# chest press 20# farmer carry Feet on ball trunk rotation, bridges Isometric abs   PATIENT EDUCATION:  Education details: POC Person educated: Patient Education method: Explanation Education comprehension: verbalized understanding   HOME EXERCISE PROGRAM: Access Code: [email or web address] URL: [email or web address] Date: 12/01/2021 Prepared by: Andris Baumann   ASSESSMENT:  CLINICAL IMPRESSION: Patient able to return to some of the exercises that we were doing without much difficulty.  She is definitley showing better motions, flexibility and strength, she was able to get up from supine on her own without struggle, where as about 1-2 months ago she required help  REHAB POTENTIAL: Good  CLINICAL DECISION MAKING: Stable/uncomplicated  EVALUATION COMPLEXITY: Low   GOALS: Goals reviewed with patient? Yes  SHORT TERM GOALS: Target date: 12/29/21  Patient will be independent with initial HEP.  Goal status: MET  LONG TERM GOALS: Target date: 02/26/22  Patient will be independent with advanced/ongoing HEP to improve outcomes and carryover.  Goal status: IN PROGRESS  2.  Patient will report 75% improvement in neck pain to improve QOL.   Baseline: 9/28- not even 50% improvement maybe 40% Goal status: IN PROGRESS  3.  Patient will demonstrate full pain free cervical ROM to complete ADLs and play with grandchildren with more ease. Goal status: IN PROGRESS 9/28- better but still limited (see above)  4.  Patient will demonstrate improved posture to decrease muscle imbalance and tightness.  Baseline: increased tightness along cervical paraspinals Goal status: IN PROGRESS 9/28- ongoing impaired posture   5.  Patient will report 49 on FOTO to demonstrate improved functional ability.  Baseline:  9/28- 64  Goal status: MET but has regressed as she is doing more and more  6.  Will demonstrate improved core strength as evidenced by passing double leg lower test  Baseline:  Goal status: progressing    PLAN: PT FREQUENCY: 2x/week  PT DURATION: 4 weeks  PLANNED INTERVENTIONS: Therapeutic exercises, Therapeutic activity, Neuromuscular re-education, Balance training, Gait training, Patient/Family education, Self Care, Joint mobilization, Dry Needling, Spinal manipulation, Spinal mobilization, Cryotherapy, Moist heat, Taping,  Vasopneumatic device, Traction, Ionotophoresis 30m/ml Dexamethasone, and Manual therapy  PLAN FOR NEXT SESSION:Renewal  MLum Babe PT 05/03/2022, 10:24 AM   [place] [address]Phone: [phone]  Fax:  [phone]

## 2022-05-05 ENCOUNTER — Ambulatory Visit: Payer: Medicare Other | Admitting: Physical Therapy

## 2022-05-05 ENCOUNTER — Encounter: Payer: Self-pay | Admitting: Physical Therapy

## 2022-05-05 DIAGNOSIS — G8929 Other chronic pain: Secondary | ICD-10-CM

## 2022-05-05 DIAGNOSIS — R519 Headache, unspecified: Secondary | ICD-10-CM | POA: Diagnosis not present

## 2022-05-05 DIAGNOSIS — M6281 Muscle weakness (generalized): Secondary | ICD-10-CM | POA: Diagnosis not present

## 2022-05-05 DIAGNOSIS — M542 Cervicalgia: Secondary | ICD-10-CM

## 2022-05-05 NOTE — Therapy (Signed)
OUTPATIENT PHYSICAL THERAPY CERVICAL TREATMENT    Patient Name: Veronica Galvan MRN: [id number] DOB:08/18/1955, 66 y.o., female Today's Date: 05/05/2022    Past Medical History:  Diagnosis Date   Allergy    Anxiety    Arthritis    hands   Cataract    Diabetes mellitus without complication ([place])    type 2   Family history of adverse reaction to anesthesia    son has malignant hyperthermia, daughter does not daughter recently had c section without problems   GERD (gastroesophageal reflux disease)    Headache    sinus   Hyperlipidemia    Hypertension    PONV (postoperative nausea and vomiting)    nausea only   Ulcer, stomach peptic yrs ago   Past Surgical History:  Procedure Laterality Date   APPENDECTOMY     both hells bone spur repair     both heels with metal clips   both shoulder rotator cuff repair     CESAREAN SECTION     x 1   COLONOSCOPY WITH PROPOFOL N/A 09/07/2016   Procedure: COLONOSCOPY WITH PROPOFOL;  Surgeon: Garlan Fair, MD;  Location: [facility];  Service: Endoscopy;  Laterality: N/A;   colonscopy  06/2011   polyps   EYE SURGERY     FRACTURE SURGERY     TUBAL LIGATION     VESICO-VAGINAL FISTULA REPAIR     Patient Active Problem List   Diagnosis Date Noted   Gastroesophageal reflux disease 11/16/2021   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 08/18/2021   Dermatofibroma 08/18/2021   History of malignant neoplasm of skin 08/18/2021   Lentigo 08/18/2021   Melanocytic nevi of trunk 08/18/2021   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   Rosacea 08/18/2021   Actinic keratosis 08/18/2021   Sensorineural hearing loss (SNHL) of left ear with unrestricted hearing of right ear 07/26/2021   Tinnitus of left ear 07/26/2021   Acute recurrent maxillary sinusitis 06/22/2021   Primary hypertension 01/12/2021   Hyperlipidemia associated with type 2 diabetes mellitus ([place]) 01/12/2021   Arthritis 01/12/2021   Type II  diabetes mellitus ([place]) 11/25/2019   Ingrown toenail 09/05/2018   Neck pain 01/29/2018   Tick bite 10/24/2017    PCP: Jodi Mourning  REFERRING PROVIDER: Genia Harold  REFERRING DIAG: M54.2  THERAPY DIAG:  Cervicalgia  Muscle weakness (generalized)  Chronic intractable headache, unspecified headache type  Rationale for Evaluation and Treatment Rehabilitation  ONSET DATE: 11/08/21  SUBJECTIVE:  SUBJECTIVE STATEMENT: Patient reports that she is overall doing well with the HA the ROM and the strength, but she is still with HA and other tightness and difficulty with her ADL's and other things, she was able to do much more this Christmas holidays, she reports that in 2022 she was non functional with HA issues and pain, reports that she was 5-10%, reports that this year she was about 50% of what her normal is with her doing and going with volunteering and making things for people and church.  She reports that she is now able to get up without a lot of struggling PERTINENT HISTORY:  Arthritis, HTN, bilateral rotator cuff surgery 15-97yr ago, DM  PAIN:  Are you having pain? Yes: NPRS scale: 7/10 Pain location: head and neck, shoulders  Pain description: tight  Aggravating factors: moving Relieving factors: massage, standing in the shower Reports that the 7/10 is much improved from the previous year which she rated a daily 8-10/10, reports qualith of life is much better but not where she needs or wants to be.  Still a daily HA  WEIGHT BEARING RESTRICTIONS No  FALLS:  Has patient fallen in last 6 months? No  LIVING ENVIRONMENT: Lives with: lives alone Lives in: House/apartment Stairs: Yes: External: 3 steps; can reach both Has following equipment at home: None  OCCUPATION:  reitred  PLOF: Independent  PATIENT GOALS to loosen it up, find out what is causing my headaches   OBJECTIVE:   DIAGNOSTIC FINDINGS:   FINDINGS: Brain: No acute infarct, mass effect or extra-axial collection. No acute or chronic hemorrhage. Normal white matter signal, parenchymal volume and CSF spaces. The midline structures are normal.  Skull and upper cervical spine: Normal calvarium and skull base. Visualized upper cervical spine and soft tissues are normal.   Sinuses/Orbits:No paranasal sinus fluid levels or advanced mucosal thickening. No mastoid or middle ear effusion. Normal orbits.   IMPRESSION: Normal brain MRI.  PATIENT SURVEYS:  FOTO 53/100 ,  April 08, 2022 has been up to 621and today was 575but has been doing a lot and lifting and is back doing much more than she was.  January 4,2024 57/100 she does report tha tshe wnet backwards from her previous due to her being able to do so much more but is hurting more   COGNITION: Overall cognitive status: Within functional limits for tasks assessed  SENSATION: WFL  POSTURE: rounded shoulders, forward head, and increased thoracic kyphosis  PALPATION: TTP and sore along upper trap and cervical paraspinals, trigger points in R and L UT   CERVICAL ROM:   Active ROM A/PROM (deg) eval 12/30/21 01/05/22 9/28 10/31 04/05/22  Flexion 100% no pain WFL WFL WNL   WNL  Extension Mild limitations w/pain [facility] W[facility] WNL   WNL  Right lateral flexion Mod limitation w/pain Mild limitations Mild limitations Severe limitation, leans with trunk  Decreased 75%  Decreased 50%  Left lateral flexion Mod limitation w/pain Mod limitations Mild limitations Severe limitation, leans with trunk  Decreased 75% Decreased  50%  Right rotation 75% w/pain Mild limitation w/pain WFL pain at end range  Mild limitation  Decreased 25%  Decreased 25%  Left rotation 75% w/pain  Mild limitation w/pain  WFL pain at end range  Mild limitation  Decreased 25%  Decreased  25%   (Blank rows = not tested)  UPPER EXTREMITY ROM: WFL, limited in IR and ER due to prior RC surgery years ago   UPPER EXTREMITY MMT:  MMT Right eval Left eval Right 01/05/22 Left 01/05/22 R 9/28 L 9/28  Shoulder flexion 4+ 4+ _0 Shoulder extension        Shoulder abduction 4+ very painful 4+ 4+ with pain _1 Shoulder adduction        Shoulder extension        Shoulder internal rotation 4+ 4+ _2 Shoulder external rotation     5 5  Middle trapezius        Lower trapezius        Elbow flexion 5 5      Elbow extension 5 5      Wrist flexion        Wrist extension        Wrist ulnar deviation        Wrist radial deviation        Wrist pronation        Wrist supination        Grip strength         (Blank rows = not tested) 05/05/22 LE MMT  right 4+/5, left 4/5  SLR 40 degrees with pain in the legs and the back  Cannot cross her legs due to tight piriformis 25 degrees hip ER  Abdominal strength 2/5 unable to sit up or hold at 40 degrees  5x STS 21 seconds    TODAY'S TREATMENT:  05/05/22 Reassess STM to the right upper trap and neck Passive stretch   05/03/22 UBE level 3 x 6 minutes 10# straight arms 25# rows 25# lats 20# leg press 25# leg curls 5# leg extension Calf stretches Feet on ball K2C, trunk rotation, bridges, isometric abs HS and piriformis stretches Dead bug   May 25, 2022 Nustep level 5 x 5 minutes 15# seated row 15# lats 10# straight arm pulls cues for core and posture Leg press 20# 2x10 10# AR press Doorway stretch STM to the upper traps and into the rhomboids   04/26/22 STM tot he upper traps, the rhomboids and the neck Gentle cervical stretches MHP/IFC to the C/T area in sitting  04/21/22 STM with some PROM of the neck and shoulders Use of vibration tot he shoulders, upper traps and rhomboids   04/19/22 Nustep Level 5 x 6 minutes Leg curls 25# 2x10 Seated row 25# 2x10 Lats 25# 2x10 Leg press 40#  3x10 Straight arm extension cues for core AR press 15# Weighted ball at arms length walking  04/14/22 STM to the neck, upper traps and the TMJ area Thoracic mobilization Occipital release  04/12/22 UBE level 4 x 6 minutes 15# straight arm pull 35# triceps AR press 15# 2x10 Seated row 25# 5# hip abduction and extension 25# lats 40# resisted gait all motions 50# leg press 4.4# ball at arms length walk for core activation   04/07/22 STM to the upper traps, cervical area, the rhomboids and occiput, TMJ and temporal area DN to the traps and occiput   04/05/22 UBE level 4 x 6 minutes Hip extension and abduction 5# 10# straight arm pulls 15# AR press Black tband abs and back 5# chest press 20# farmer carry Feet on ball trunk rotation, bridges Isometric abs   PATIENT EDUCATION:  Education details: POC Person educated: Patient Education method: Explanation Education comprehension: verbalized understanding   HOME EXERCISE PROGRAM: Access Code: [email or web address] URL: [email or web address] Date: 12/01/2021 Prepared by: Andris Baumann   ASSESSMENT:  CLINICAL IMPRESSION: REassessment done today and really looked at how she  is doing overall and comparing towhere she was, she is doing much better subjectively and some objectively, she has had ups and downs on FOTO due to her being so active now and setting herself back with pain.  She is very tight in the LE and the upper traps, reports overall her jaw is much improved.  REHAB POTENTIAL: Good  CLINICAL DECISION MAKING: Stable/uncomplicated  EVALUATION COMPLEXITY: Low   GOALS: Goals reviewed with patient? Yes  SHORT TERM GOALS: Target date: 12/29/21  Patient will be independent with initial HEP.  Goal status: MET  LONG TERM GOALS: Target date: 02/26/22  Patient will be independent with advanced/ongoing HEP to improve outcomes and carryover.  Goal status: IN PROGRESS  2.  Patient will report 75% improvement  in neck pain to improve QOL.   Baseline: 9/28- not even 50% improvement maybe 40% Goal status: IN PROGRESS  3.  Patient will demonstrate full pain free cervical ROM to complete ADLs and play with grandchildren with more ease. Goal status: IN PROGRESS 9/28- better but still limited (see above)  4.  Patient will demonstrate improved posture to decrease muscle imbalance and tightness.  Baseline: increased tightness along cervical paraspinals Goal status: IN PROGRESS 9/28- ongoing impaired posture   5.  Patient will report 22 on FOTO to demonstrate improved functional ability.  Baseline:  9/28- 64  Goal status: MET but has regressed as she is doing more and more  6.  Will demonstrate improved core strength as evidenced by passing double leg lower test  Baseline:  Goal status: progressing    PLAN: PT FREQUENCY: 2x/week  PT DURATION: 4 weeks  PLANNED INTERVENTIONS: Therapeutic exercises, Therapeutic activity, Neuromuscular re-education, Balance training, Gait training, Patient/Family education, Self Care, Joint mobilization, Dry Needling, Spinal manipulation, Spinal mobilization, Cryotherapy, Moist heat, Taping, Vasopneumatic device, Traction, Ionotophoresis 91m/ml Dexamethasone, and Manual therapy  PLAN FOR NEXT SESSION:  sent renewal to PCP  MLum Babe PT 05/05/2022, 3:37 PM   [place] [address]Phone: [phone]  Fax:  [phone]

## 2022-05-10 ENCOUNTER — Encounter: Payer: Self-pay | Admitting: Physical Therapy

## 2022-05-10 ENCOUNTER — Ambulatory Visit: Payer: Medicare Other | Admitting: Physical Therapy

## 2022-05-10 DIAGNOSIS — M6281 Muscle weakness (generalized): Secondary | ICD-10-CM | POA: Diagnosis not present

## 2022-05-10 DIAGNOSIS — R519 Headache, unspecified: Secondary | ICD-10-CM | POA: Diagnosis not present

## 2022-05-10 DIAGNOSIS — G8929 Other chronic pain: Secondary | ICD-10-CM | POA: Diagnosis not present

## 2022-05-10 DIAGNOSIS — M542 Cervicalgia: Secondary | ICD-10-CM

## 2022-05-10 NOTE — Therapy (Signed)
OUTPATIENT PHYSICAL THERAPY CERVICAL TREATMENT    Patient Name: Veronica Galvan MRN: [id number] DOB:Sep 15, 1955, 67 y.o., female Today's Date: 05/10/2022    Past Medical History:  Diagnosis Date   Allergy    Anxiety    Arthritis    hands   Cataract    Diabetes mellitus without complication ([place])    type 2   Family history of adverse reaction to anesthesia    son has malignant hyperthermia, daughter does not daughter recently had c section without problems   GERD (gastroesophageal reflux disease)    Headache    sinus   Hyperlipidemia    Hypertension    PONV (postoperative nausea and vomiting)    nausea only   Ulcer, stomach peptic yrs ago   Past Surgical History:  Procedure Laterality Date   APPENDECTOMY     both hells bone spur repair     both heels with metal clips   both shoulder rotator cuff repair     CESAREAN SECTION     x 1   COLONOSCOPY WITH PROPOFOL N/A 09/07/2016   Procedure: COLONOSCOPY WITH PROPOFOL;  Surgeon: Garlan Fair, MD;  Location: [facility];  Service: Endoscopy;  Laterality: N/A;   colonscopy  06/2011   polyps   EYE SURGERY     FRACTURE SURGERY     TUBAL LIGATION     VESICO-VAGINAL FISTULA REPAIR     Patient Active Problem List   Diagnosis Date Noted   Gastroesophageal reflux disease 11/16/2021   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 08/18/2021   Dermatofibroma 08/18/2021   History of malignant neoplasm of skin 08/18/2021   Lentigo 08/18/2021   Melanocytic nevi of trunk 08/18/2021   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   Rosacea 08/18/2021   Actinic keratosis 08/18/2021   Sensorineural hearing loss (SNHL) of left ear with unrestricted hearing of right ear 07/26/2021   Tinnitus of left ear 07/26/2021   Acute recurrent maxillary sinusitis 06/22/2021   Primary hypertension 01/12/2021   Hyperlipidemia associated with type 2 diabetes mellitus ([place]) 01/12/2021   Arthritis 01/12/2021   Type II  diabetes mellitus ([place]) 11/25/2019   Ingrown toenail 09/05/2018   Neck pain 01/29/2018   Tick bite 10/24/2017    PCP: Jodi Mourning  REFERRING PROVIDER: Genia Harold  REFERRING DIAG: M54.2  THERAPY DIAG:  Cervicalgia  Muscle weakness (generalized)  Chronic intractable headache, unspecified headache type  Rationale for Evaluation and Treatment Rehabilitation  ONSET DATE: 11/08/21  SUBJECTIVE:  SUBJECTIVE STATEMENT: Patient reports that she is a little tired.  No significant changes  PERTINENT HISTORY:  Arthritis, HTN, bilateral rotator cuff surgery 15-75yr ago, DM  PAIN:  Are you having pain? Yes: NPRS scale: 7/10 Pain location: head and neck, shoulders  Pain description: tight  Aggravating factors: moving Relieving factors: massage, standing in the shower Reports that the 7/10 is much improved from the previous year which she rated a daily 8-10/10, reports qualith of life is much better but not where she needs or wants to be.  Still a daily HA  WEIGHT BEARING RESTRICTIONS No  FALLS:  Has patient fallen in last 6 months? No  LIVING ENVIRONMENT: Lives with: lives alone Lives in: House/apartment Stairs: Yes: External: 3 steps; can reach both Has following equipment at home: None  OCCUPATION: reitred  PLOF: Independent  PATIENT GOALS to loosen it up, find out what is causing my headaches   OBJECTIVE:   DIAGNOSTIC FINDINGS:   FINDINGS: Brain: No acute infarct, mass effect or extra-axial collection. No acute or chronic hemorrhage. Normal white matter signal, parenchymal volume and CSF spaces. The midline structures are normal.  Skull and upper cervical spine: Normal calvarium and skull base. Visualized upper cervical spine and soft tissues are normal.    Sinuses/Orbits:No paranasal sinus fluid levels or advanced mucosal thickening. No mastoid or middle ear effusion. Normal orbits.   IMPRESSION: Normal brain MRI.  PATIENT SURVEYS:  FOTO 53/100 ,  April 08, 2022 has been up to 630and today was 532but has been doing a lot and lifting and is back doing much more than she was.  January 4,2024 57/100 she does report tha tshe wnet backwards from her previous due to her being able to do so much more but is hurting more   COGNITION: Overall cognitive status: Within functional limits for tasks assessed  SENSATION: WFL  POSTURE: rounded shoulders, forward head, and increased thoracic kyphosis  PALPATION: TTP and sore along upper trap and cervical paraspinals, trigger points in R and L UT   CERVICAL ROM:   Active ROM A/PROM (deg) eval 12/30/21 01/05/22 9/28 10/31 04/05/22  Flexion 100% no pain WFL WFL WNL   WNL  Extension [facility] limitations w/[facility] WFL WNL   WNL  Right lateral flexion Mod limitation w/pain Mild limitations Mild limitations Severe limitation, leans with trunk  Decreased 75%  Decreased 50%  Left lateral flexion Mod limitation w/pain Mod limitations Mild limitations Severe limitation, leans with trunk  Decreased 75% Decreased  50%  Right rotation 75% w/pain Mild limitation w/pain WFL pain at end range  Mild limitation  Decreased 25%  Decreased 25%  Left rotation 75% w/pain  Mild limitation w/pain  WFL pain at end range  Mild limitation  Decreased 25% Decreased  25%   (Blank rows = not tested)  UPPER EXTREMITY ROM: WFL, limited in IR and ER due to prior RC surgery years ago   UPPER EXTREMITY MMT:  MMT Right eval Left eval Right 01/05/22 Left 01/05/22 R 9/28 L 9/28  Shoulder flexion 4+ 4+ '5 5 5 5  '$ Shoulder extension        Shoulder abduction 4+ very painful 4+ 4+ with pain '5 5 5  '$ Shoulder adduction        Shoulder extension        Shoulder internal rotation 4+ 4+ '5 5 5 5  '$ Shoulder external rotation     5 5  Middle  trapezius  Lower trapezius        Elbow flexion 5 5      Elbow extension 5 5      Wrist flexion        Wrist extension        Wrist ulnar deviation        Wrist radial deviation        Wrist pronation        Wrist supination        Grip strength         (Blank rows = not tested) 05/05/22 LE MMT  right 4+/5, left 4/5  SLR 40 degrees with pain in the legs and the back  Cannot cross her legs due to tight piriformis 25 degrees hip ER  Abdominal strength 2/5 unable to sit up or hold at 40 degrees  5x STS 21 seconds    TODAY'S TREATMENT:  05/10/22 UBE level 4 x 6 minutes 10# straight arm pulls Leg curls 25# 2x10 Leg extension 10# 3x10 15# AR press 25# rows 25# lats Worked on getting up from the floor Stretch of the piriformis and the Hs a few different ways 20# farmer carry 2 laps each arm 4# carry arms out in front Partial sit ups, had to do eccentrics  05/05/22 Reassess STM to the right upper trap and neck Passive stretch  05/03/22 UBE level 3 x 6 minutes 10# straight arms 25# rows 25# lats 20# leg press 25# leg curls 5# leg extension Calf stretches Feet on ball K2C, trunk rotation, bridges, isometric abs HS and piriformis stretches Dead bug  05-May-2022 Nustep level 5 x 5 minutes 15# seated row 15# lats 10# straight arm pulls cues for core and posture Leg press 20# 2x10 10# AR press Doorway stretch STM to the upper traps and into the rhomboids   04/26/22 STM tot he upper traps, the rhomboids and the neck Gentle cervical stretches MHP/IFC to the C/T area in sitting  04/21/22 STM with some PROM of the neck and shoulders Use of vibration tot he shoulders, upper traps and rhomboids   04/19/22 Nustep Level 5 x 6 minutes Leg curls 25# 2x10 Seated row 25# 2x10 Lats 25# 2x10 Leg press 40# 3x10 Straight arm extension cues for core AR press 15# Weighted ball at arms length walking  04/14/22 STM to the neck, upper traps and the TMJ area Thoracic  mobilization Occipital release    PATIENT EDUCATION:  Education details: POC Person educated: Patient Education method: Explanation Education comprehension: verbalized understanding   HOME EXERCISE PROGRAM: Access Code: [email or web address] URL: [email or web address] Date: 12/01/2021 Prepared by: Andris Baumann   ASSESSMENT:  CLINICAL IMPRESSION: REally did a nice job of exercises and engaging core, tried some getting up from floor, this is difficult for her.  She is very tight in the hips and cannot put on socks easily and uses slip on shoes REHAB POTENTIAL: Good  CLINICAL DECISION MAKING: Stable/uncomplicated  EVALUATION COMPLEXITY: Low   GOALS: Goals reviewed with patient? Yes  SHORT TERM GOALS: Target date: 12/29/21  Patient will be independent with initial HEP.  Goal status: MET  LONG TERM GOALS: Target date: 02/26/22  Patient will be independent with advanced/ongoing HEP to improve outcomes and carryover.  Goal status: IN PROGRESS  2.  Patient will report 75% improvement in neck pain to improve QOL.   Baseline: 9/28- not even 50% improvement maybe 40% Goal status: IN PROGRESS  3.  Patient will demonstrate full pain free cervical ROM to complete ADLs  and play with grandchildren with more ease. Goal status: IN PROGRESS 9/28- better but still limited (see above)  4.  Patient will demonstrate improved posture to decrease muscle imbalance and tightness.  Baseline: increased tightness along cervical paraspinals Goal status: IN PROGRESS 9/28- ongoing impaired posture   5.  Patient will report 90 on FOTO to demonstrate improved functional ability.  Baseline:  9/28- 64  Goal status: MET but has regressed as she is doing more and more  6.  Will demonstrate improved core strength as evidenced by passing double leg lower test  Baseline:  Goal status: progressing    PLAN: PT FREQUENCY: 2x/week  PT DURATION: 4 weeks  PLANNED INTERVENTIONS: Therapeutic  exercises, Therapeutic activity, Neuromuscular re-education, Balance training, Gait training, Patient/Family education, Self Care, Joint mobilization, Dry Needling, Spinal manipulation, Spinal mobilization, Cryotherapy, Moist heat, Taping, Vasopneumatic device, Traction, Ionotophoresis '4mg'$ /ml Dexamethasone, and Manual therapy  PLAN FOR NEXT SESSION:  continue with core strength, functional activities and the pain  Lum Babe, PT 05/10/2022, 9:28 AM   [place]. [place], [place], [address] Phone: [phone]   Fax:  [phone]

## 2022-05-11 DIAGNOSIS — M542 Cervicalgia: Secondary | ICD-10-CM | POA: Diagnosis not present

## 2022-05-11 DIAGNOSIS — M154 Erosive (osteo)arthritis: Secondary | ICD-10-CM | POA: Diagnosis not present

## 2022-05-12 ENCOUNTER — Ambulatory Visit: Payer: Medicare Other | Admitting: Physical Therapy

## 2022-05-12 ENCOUNTER — Encounter: Payer: Self-pay | Admitting: Physical Therapy

## 2022-05-12 DIAGNOSIS — M6281 Muscle weakness (generalized): Secondary | ICD-10-CM

## 2022-05-12 DIAGNOSIS — R519 Headache, unspecified: Secondary | ICD-10-CM | POA: Diagnosis not present

## 2022-05-12 DIAGNOSIS — M542 Cervicalgia: Secondary | ICD-10-CM | POA: Diagnosis not present

## 2022-05-12 DIAGNOSIS — G8929 Other chronic pain: Secondary | ICD-10-CM | POA: Diagnosis not present

## 2022-05-12 NOTE — Therapy (Signed)
OUTPATIENT PHYSICAL THERAPY CERVICAL TREATMENT    Patient Name: Veronica Galvan MRN: [id number] DOB:26-Mar-1956, 67 y.o., female Today's Date: 05/12/2022    Past Medical History:  Diagnosis Date   Allergy    Anxiety    Arthritis    hands   Cataract    Diabetes mellitus without complication ([place])    type 2   Family history of adverse reaction to anesthesia    son has malignant hyperthermia, daughter does not daughter recently had c section without problems   GERD (gastroesophageal reflux disease)    Headache    sinus   Hyperlipidemia    Hypertension    PONV (postoperative nausea and vomiting)    nausea only   Ulcer, stomach peptic yrs ago   Past Surgical History:  Procedure Laterality Date   APPENDECTOMY     both hells bone spur repair     both heels with metal clips   both shoulder rotator cuff repair     CESAREAN SECTION     x 1   COLONOSCOPY WITH PROPOFOL N/A 09/07/2016   Procedure: COLONOSCOPY WITH PROPOFOL;  Surgeon: Garlan Fair, MD;  Location: [facility];  Service: Endoscopy;  Laterality: N/A;   colonscopy  06/2011   polyps   EYE SURGERY     FRACTURE SURGERY     TUBAL LIGATION     VESICO-VAGINAL FISTULA REPAIR     Patient Active Problem List   Diagnosis Date Noted   Gastroesophageal reflux disease 11/16/2021   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 08/18/2021   Dermatofibroma 08/18/2021   History of malignant neoplasm of skin 08/18/2021   Lentigo 08/18/2021   Melanocytic nevi of trunk 08/18/2021   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   Rosacea 08/18/2021   Actinic keratosis 08/18/2021   Sensorineural hearing loss (SNHL) of left ear with unrestricted hearing of right ear 07/26/2021   Tinnitus of left ear 07/26/2021   Acute recurrent maxillary sinusitis 06/22/2021   Primary hypertension 01/12/2021   Hyperlipidemia associated with type 2 diabetes mellitus ([place]) 01/12/2021   Arthritis 01/12/2021   Type II  diabetes mellitus ([place]) 11/25/2019   Ingrown toenail 09/05/2018   Neck pain 01/29/2018   Tick bite 10/24/2017    PCP: Jodi Mourning  REFERRING PROVIDER: Genia Harold  REFERRING DIAG: M54.2  THERAPY DIAG:  Cervicalgia  Muscle weakness (generalized)  Chronic intractable headache, unspecified headache type  Rationale for Evaluation and Treatment Rehabilitation  ONSET DATE: 11/08/21  SUBJECTIVE:  SUBJECTIVE STATEMENT: Patient reports that she was tired and a little sore, but reports that she liked that  PERTINENT HISTORY:  Arthritis, HTN, bilateral rotator cuff surgery 15-61yr ago, DM  PAIN:  Are you having pain? Yes: NPRS scale: 7/10 Pain location: head and neck, shoulders  Pain description: tight  Aggravating factors: moving Relieving factors: massage, standing in the shower Reports that the 7/10 is much improved from the previous year which she rated a daily 8-10/10, reports qualith of life is much better but not where she needs or wants to be.  Still a daily HA  WEIGHT BEARING RESTRICTIONS No  FALLS:  Has patient fallen in last 6 months? No  LIVING ENVIRONMENT: Lives with: lives alone Lives in: House/apartment Stairs: Yes: External: 3 steps; can reach both Has following equipment at home: None  OCCUPATION: reitred  PLOF: Independent  PATIENT GOALS to loosen it up, find out what is causing my headaches   OBJECTIVE:   DIAGNOSTIC FINDINGS:   FINDINGS: Brain: No acute infarct, mass effect or extra-axial collection. No acute or chronic hemorrhage. Normal white matter signal, parenchymal volume and CSF spaces. The midline structures are normal.  Skull and upper cervical spine: Normal calvarium and skull base. Visualized upper cervical spine and soft tissues are  normal.   Sinuses/Orbits:No paranasal sinus fluid levels or advanced mucosal thickening. No mastoid or middle ear effusion. Normal orbits.   IMPRESSION: Normal brain MRI.  PATIENT SURVEYS:  FOTO 53/100 ,  April 08, 2022 has been up to 645and today was 537but has been doing a lot and lifting and is back doing much more than she was.  January 4,2024 57/100 she does report tha tshe wnet backwards from her previous due to her being able to do so much more but is hurting more   COGNITION: Overall cognitive status: Within functional limits for tasks assessed  SENSATION: WFL  POSTURE: rounded shoulders, forward head, and increased thoracic kyphosis  PALPATION: TTP and sore along upper trap and cervical paraspinals, trigger points in R and L UT   CERVICAL ROM:   Active ROM A/PROM (deg) eval 12/30/21 01/05/22 9/28 10/31 04/05/22  Flexion 100% no pain WFL WFL WNL   WNL  Extension Mild limitations w/[facility] WFL WNL   WNL  Right lateral flexion Mod limitation w/pain Mild limitations Mild limitations Severe limitation, leans with trunk  Decreased 75%  Decreased 50%  Left lateral flexion Mod limitation w/pain Mod limitations Mild limitations Severe limitation, leans with trunk  Decreased 75% Decreased  50%  Right rotation 75% w/pain Mild limitation w/pain WFL pain at end range  Mild limitation  Decreased 25%  Decreased 25%  Left rotation 75% w/pain  Mild limitation w/pain  WFL pain at end range  Mild limitation  Decreased 25% Decreased  25%   (Blank rows = not tested)  UPPER EXTREMITY ROM: WFL, limited in IR and ER due to prior RC surgery years ago   UPPER EXTREMITY MMT:  MMT Right eval Left eval Right 01/05/22 Left 01/05/22 R 9/28 L 9/28  Shoulder flexion 4+ 4+ '5 5 5 5  '$ Shoulder extension        Shoulder abduction 4+ very painful 4+ 4+ with pain '5 5 5  '$ Shoulder adduction        Shoulder extension        Shoulder internal rotation 4+ 4+ '5 5 5 5  '$ Shoulder external rotation     5 5   Middle trapezius  Lower trapezius        Elbow flexion 5 5      Elbow extension 5 5      Wrist flexion        Wrist extension        Wrist ulnar deviation        Wrist radial deviation        Wrist pronation        Wrist supination        Grip strength         (Blank rows = not tested) 05/05/22 LE MMT  right 4+/5, left 4/5  SLR 40 degrees with pain in the legs and the back  Cannot cross her legs due to tight piriformis 25 degrees hip ER  Abdominal strength 2/5 unable to sit up or hold at 40 degrees  5x STS 21 seconds    TODAY'S TREATMENT:  05/12/21 STM to the upper traps, neck and rhomboids, PROM, gentle stretches 5# hip extension and abducton 40# leg press 10# straight arm pulls 15# AR press Eccentric abs with assist  05/10/22 UBE level 4 x 6 minutes 10# straight arm pulls Leg curls 25# 2x10 Leg extension 10# 3x10 15# AR press 25# rows 25# lats Worked on getting up from the floor Stretch of the piriformis and the Hs a few different ways 20# farmer carry 2 laps each arm 4# carry arms out in front Partial sit ups, had to do eccentrics  05/05/22 Reassess STM to the right upper trap and neck Passive stretch  05/03/22 UBE level 3 x 6 minutes 10# straight arms 25# rows 25# lats 20# leg press 25# leg curls 5# leg extension Calf stretches Feet on ball K2C, trunk rotation, bridges, isometric abs HS and piriformis stretches Dead bug  2022/05/25 Nustep level 5 x 5 minutes 15# seated row 15# lats 10# straight arm pulls cues for core and posture Leg press 20# 2x10 10# AR press Doorway stretch STM to the upper traps and into the rhomboids   04/26/22 STM tot he upper traps, the rhomboids and the neck Gentle cervical stretches MHP/IFC to the C/T area in sitting  04/21/22 STM with some PROM of the neck and shoulders Use of vibration tot he shoulders, upper traps and rhomboids   04/19/22 Nustep Level 5 x 6 minutes Leg curls 25# 2x10 Seated row 25#  2x10 Lats 25# 2x10 Leg press 40# 3x10 Straight arm extension cues for core AR press 15# Weighted ball at arms length walking  04/14/22 STM to the neck, upper traps and the TMJ area Thoracic mobilization Occipital release    PATIENT EDUCATION:  Education details: POC Person educated: Patient Education method: Explanation Education comprehension: verbalized understanding   HOME EXERCISE PROGRAM: Access Code: [email or web address] URL: [email or web address] Date: 12/01/2021 Prepared by: Andris Baumann   ASSESSMENT:  CLINICAL IMPRESSION: Patient was happy with the last treatment and reports that she was tight but had a little less pain, still a HA today, we went ahead and went back to the core strength.  REHAB POTENTIAL: Good  CLINICAL DECISION MAKING: Stable/uncomplicated  EVALUATION COMPLEXITY: Low   GOALS: Goals reviewed with patient? Yes  SHORT TERM GOALS: Target date: 12/29/21  Patient will be independent with initial HEP.  Goal status: MET  LONG TERM GOALS: Target date: 02/26/22  Patient will be independent with advanced/ongoing HEP to improve outcomes and carryover.  Goal status: IN PROGRESS  2.  Patient will report 75% improvement in neck pain to improve QOL.  Baseline: 9/28- not even 50% improvement maybe 40% Goal status: IN PROGRESS  3.  Patient will demonstrate full pain free cervical ROM to complete ADLs and play with grandchildren with more ease. Goal status: IN PROGRESS 9/28- better but still limited (see above)  4.  Patient will demonstrate improved posture to decrease muscle imbalance and tightness.  Baseline: increased tightness along cervical paraspinals Goal status: IN PROGRESS 9/28- ongoing impaired posture   5.  Patient will report 66 on FOTO to demonstrate improved functional ability.  Baseline:  9/28- 64  Goal status: MET but has regressed as she is doing more and more  6.  Will demonstrate improved core strength as evidenced by  passing double leg lower test  Baseline:  Goal status: progressing    PLAN: PT FREQUENCY: 2x/week  PT DURATION: 4 weeks  PLANNED INTERVENTIONS: Therapeutic exercises, Therapeutic activity, Neuromuscular re-education, Balance training, Gait training, Patient/Family education, Self Care, Joint mobilization, Dry Needling, Spinal manipulation, Spinal mobilization, Cryotherapy, Moist heat, Taping, Vasopneumatic device, Traction, Ionotophoresis '4mg'$ /ml Dexamethasone, and Manual therapy  PLAN FOR NEXT SESSION:  continue with core strength, functional activities and the pain  Lum Babe, PT 05/12/2022, 10:30 AM   [place]. [place], [place], [address] Phone: [phone]   Fax:  [phone]

## 2022-05-17 ENCOUNTER — Ambulatory Visit: Payer: Medicare Other | Admitting: Physical Therapy

## 2022-05-17 ENCOUNTER — Encounter: Payer: Self-pay | Admitting: Physical Therapy

## 2022-05-17 DIAGNOSIS — M542 Cervicalgia: Secondary | ICD-10-CM

## 2022-05-17 DIAGNOSIS — R519 Headache, unspecified: Secondary | ICD-10-CM | POA: Diagnosis not present

## 2022-05-17 DIAGNOSIS — M6281 Muscle weakness (generalized): Secondary | ICD-10-CM | POA: Diagnosis not present

## 2022-05-17 DIAGNOSIS — G8929 Other chronic pain: Secondary | ICD-10-CM | POA: Diagnosis not present

## 2022-05-17 NOTE — Therapy (Signed)
OUTPATIENT PHYSICAL THERAPY CERVICAL TREATMENT    Patient Name: Veronica Galvan MRN: [id number] DOB:1956/03/04, 67 y.o., female Today's Date: 05/17/2022    Past Medical History:  Diagnosis Date   Allergy    Anxiety    Arthritis    hands   Cataract    Diabetes mellitus without complication ([place])    type 2   Family history of adverse reaction to anesthesia    son has malignant hyperthermia, daughter does not daughter recently had c section without problems   GERD (gastroesophageal reflux disease)    Headache    sinus   Hyperlipidemia    Hypertension    PONV (postoperative nausea and vomiting)    nausea only   Ulcer, stomach peptic yrs ago   Past Surgical History:  Procedure Laterality Date   APPENDECTOMY     both hells bone spur repair     both heels with metal clips   both shoulder rotator cuff repair     CESAREAN SECTION     x 1   COLONOSCOPY WITH PROPOFOL N/A 09/07/2016   Procedure: COLONOSCOPY WITH PROPOFOL;  Surgeon: Garlan Fair, MD;  Location: [facility];  Service: Endoscopy;  Laterality: N/A;   colonscopy  06/2011   polyps   EYE SURGERY     FRACTURE SURGERY     TUBAL LIGATION     VESICO-VAGINAL FISTULA REPAIR     Patient Active Problem List   Diagnosis Date Noted   Gastroesophageal reflux disease 11/16/2021   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 08/18/2021   Dermatofibroma 08/18/2021   History of malignant neoplasm of skin 08/18/2021   Lentigo 08/18/2021   Melanocytic nevi of trunk 08/18/2021   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   Rosacea 08/18/2021   Actinic keratosis 08/18/2021   Sensorineural hearing loss (SNHL) of left ear with unrestricted hearing of right ear 07/26/2021   Tinnitus of left ear 07/26/2021   Acute recurrent maxillary sinusitis 06/22/2021   Primary hypertension 01/12/2021   Hyperlipidemia associated with type 2 diabetes mellitus ([place]) 01/12/2021   Arthritis 01/12/2021   Type II  diabetes mellitus ([place]) 11/25/2019   Ingrown toenail 09/05/2018   Neck pain 01/29/2018   Tick bite 10/24/2017    PCP: Jodi Mourning  REFERRING PROVIDER: Genia Harold  REFERRING DIAG: M54.2  THERAPY DIAG:  Cervicalgia  Muscle weakness (generalized)  Chronic intractable headache, unspecified headache type  Rationale for Evaluation and Treatment Rehabilitation  ONSET DATE: 11/08/21  SUBJECTIVE:  SUBJECTIVE STATEMENT: Patient reports that she is doing well, still with the HA and the stiffness but doing better overall  PERTINENT HISTORY:  Arthritis, HTN, bilateral rotator cuff surgery 15-55yr ago, DM  PAIN:  Are you having pain? Yes: NPRS scale: 7/10 Pain location: head and neck, shoulders  Pain description: tight  Aggravating factors: moving Relieving factors: massage, standing in the shower Reports that the 7/10 is much improved from the previous year which she rated a daily 8-10/10, reports qualith of life is much better but not where she needs or wants to be.  Still a daily HA  WEIGHT BEARING RESTRICTIONS No  FALLS:  Has patient fallen in last 6 months? No  LIVING ENVIRONMENT: Lives with: lives alone Lives in: House/apartment Stairs: Yes: External: 3 steps; can reach both Has following equipment at home: None  OCCUPATION: reitred  PLOF: Independent  PATIENT GOALS to loosen it up, find out what is causing my headaches   OBJECTIVE:   DIAGNOSTIC FINDINGS:   FINDINGS: Brain: No acute infarct, mass effect or extra-axial collection. No acute or chronic hemorrhage. Normal white matter signal, parenchymal volume and CSF spaces. The midline structures are normal.  Skull and upper cervical spine: Normal calvarium and skull base. Visualized upper cervical spine and  soft tissues are normal.   Sinuses/Orbits:No paranasal sinus fluid levels or advanced mucosal thickening. No mastoid or middle ear effusion. Normal orbits.   IMPRESSION: Normal brain MRI.  PATIENT SURVEYS:  FOTO 53/100 ,  April 08, 2022 has been up to 686and today was 567but has been doing a lot and lifting and is back doing much more than she was.  January 4,2024 57/100 she does report tha tshe wnet backwards from her previous due to her being able to do so much more but is hurting more   COGNITION: Overall cognitive status: Within functional limits for tasks assessed  SENSATION: WFL  POSTURE: rounded shoulders, forward head, and increased thoracic kyphosis  PALPATION: TTP and sore along upper trap and cervical paraspinals, trigger points in R and L UT   CERVICAL ROM:   Active ROM A/PROM (deg) eval 12/30/21 01/05/22 9/28 10/31 04/05/22  Flexion 100% no pain WFL WFL WNL   WNL  Extension Mild limitations [facility] W[facility]   WNL  Right lateral flexion Mod limitation w/pain Mild limitations Mild limitations Severe limitation, leans with trunk  Decreased 75%  Decreased 50%  Left lateral flexion Mod limitation w/pain Mod limitations Mild limitations Severe limitation, leans with trunk  Decreased 75% Decreased  50%  Right rotation 75% w/pain Mild limitation w/pain WFL pain at end range  Mild limitation  Decreased 25%  Decreased 25%  Left rotation 75% w/pain  Mild limitation w/pain  WFL pain at end range  Mild limitation  Decreased 25% Decreased  25%   (Blank rows = not tested)  UPPER EXTREMITY ROM: WFL, limited in IR and ER due to prior RC surgery years ago   UPPER EXTREMITY MMT:  MMT Right eval Left eval Right 01/05/22 Left 01/05/22 R 9/28 L 9/28  Shoulder flexion 4+ 4+ '5 5 5 5  '$ Shoulder extension        Shoulder abduction 4+ very painful 4+ 4+ with pain '5 5 5  '$ Shoulder adduction        Shoulder extension        Shoulder internal rotation 4+ 4+ '5 5 5 5  '$ Shoulder external  rotation     5 5  Middle trapezius  Lower trapezius        Elbow flexion 5 5      Elbow extension 5 5      Wrist flexion        Wrist extension        Wrist ulnar deviation        Wrist radial deviation        Wrist pronation        Wrist supination        Grip strength         (Blank rows = not tested) 05/05/22 LE MMT  right 4+/5, left 4/5  SLR 40 degrees with pain in the legs and the back  Cannot cross her legs due to tight piriformis 25 degrees hip ER  Abdominal strength 2/5 unable to sit up or hold at 40 degrees  5x STS 21 seconds    TODAY'S TREATMENT:  05/17/22 UBE level 4 x 6 minutes 10# straight arm pulls 15# AR 5# hip abduction and extension 25# rows 2x10 25# lats 2x10 10# chest press Leg curls 25# 2x10 Leg extension 10# 2x10 Leg press 50# 2x10 Feet on ball bridge, isometric abs and partial sit ups Passive HS and piriformis stretch  05/12/21 STM to the upper traps, neck and rhomboids, PROM, gentle stretches 5# hip extension and abducton 40# leg press 10# straight arm pulls 15# AR press Eccentric abs with assist  05/10/22 UBE level 4 x 6 minutes 10# straight arm pulls Leg curls 25# 2x10 Leg extension 10# 3x10 15# AR press 25# rows 25# lats Worked on getting up from the floor Stretch of the piriformis and the Hs a few different ways 20# farmer carry 2 laps each arm 4# carry arms out in front Partial sit ups, had to do eccentrics  05/05/22 Reassess STM to the right upper trap and neck Passive stretch  05/03/22 UBE level 3 x 6 minutes 10# straight arms 25# rows 25# lats 20# leg press 25# leg curls 5# leg extension Calf stretches Feet on ball K2C, trunk rotation, bridges, isometric abs HS and piriformis stretches Dead bug  May 03, 2022 Nustep level 5 x 5 minutes 15# seated row 15# lats 10# straight arm pulls cues for core and posture Leg press 20# 2x10 10# AR press Doorway stretch STM to the upper traps and into the  rhomboids   04/26/22 STM tot he upper traps, the rhomboids and the neck Gentle cervical stretches MHP/IFC to the C/T area in sitting  04/21/22 STM with some PROM of the neck and shoulders Use of vibration tot he shoulders, upper traps and rhomboids     PATIENT EDUCATION:  Education details: POC Person educated: Patient Education method: Explanation Education comprehension: verbalized understanding   HOME EXERCISE PROGRAM: Access Code: [email or web address] URL: [email or web address] Date: 12/01/2021 Prepared by: Andris Baumann   ASSESSMENT:  CLINICAL IMPRESSION: Patient continues to do well with the progression of exercise to advance her strength and overall function and hopefully decrease her pain  REHAB POTENTIAL: Good  CLINICAL DECISION MAKING: Stable/uncomplicated  EVALUATION COMPLEXITY: Low   GOALS: Goals reviewed with patient? Yes  SHORT TERM GOALS: Target date: 12/29/21  Patient will be independent with initial HEP.  Goal status: MET  LONG TERM GOALS: Target date: 02/26/22  Patient will be independent with advanced/ongoing HEP to improve outcomes and carryover.  Goal status: IN PROGRESS  2.  Patient will report 75% improvement in neck pain to improve QOL.   Baseline: 9/28- not even 50% improvement maybe 40% Goal status:  IN PROGRESS  3.  Patient will demonstrate full pain free cervical ROM to complete ADLs and play with grandchildren with more ease. Goal status: IN PROGRESS 9/28- better but still limited (see above)  4.  Patient will demonstrate improved posture to decrease muscle imbalance and tightness.  Baseline: increased tightness along cervical paraspinals Goal status: IN PROGRESS 9/28- ongoing impaired posture   5.  Patient will report 52 on FOTO to demonstrate improved functional ability.  Baseline:  9/28- 64  Goal status: MET but has regressed as she is doing more and more  6.  Will demonstrate improved core strength as evidenced by  passing double leg lower test  Baseline:  Goal status: progressing    PLAN: PT FREQUENCY: 2x/week  PT DURATION: 4 weeks  PLANNED INTERVENTIONS: Therapeutic exercises, Therapeutic activity, Neuromuscular re-education, Balance training, Gait training, Patient/Family education, Self Care, Joint mobilization, Dry Needling, Spinal manipulation, Spinal mobilization, Cryotherapy, Moist heat, Taping, Vasopneumatic device, Traction, Ionotophoresis '4mg'$ /ml Dexamethasone, and Manual therapy  PLAN FOR NEXT SESSION:  continue with core strength, functional activities and the pain  Lum Babe, PT 05/17/2022, 9:33 AM   [address] [place], [place], [address] Phone: [phone]   Fax:  [phone]

## 2022-05-19 ENCOUNTER — Ambulatory Visit (INDEPENDENT_AMBULATORY_CARE_PROVIDER_SITE_OTHER): Payer: Medicare Other | Admitting: Family

## 2022-05-19 ENCOUNTER — Ambulatory Visit: Payer: Medicare Other | Admitting: Physical Therapy

## 2022-05-19 ENCOUNTER — Encounter: Payer: Self-pay | Admitting: Family

## 2022-05-19 VITALS — BP 122/74 | HR 94 | Temp 97.4°F | Ht 61.0 in | Wt 205.4 lb

## 2022-05-19 DIAGNOSIS — G8929 Other chronic pain: Secondary | ICD-10-CM | POA: Diagnosis not present

## 2022-05-19 DIAGNOSIS — R519 Headache, unspecified: Secondary | ICD-10-CM

## 2022-05-19 DIAGNOSIS — E119 Type 2 diabetes mellitus without complications: Secondary | ICD-10-CM | POA: Diagnosis not present

## 2022-05-19 DIAGNOSIS — M542 Cervicalgia: Secondary | ICD-10-CM | POA: Diagnosis not present

## 2022-05-19 DIAGNOSIS — M6281 Muscle weakness (generalized): Secondary | ICD-10-CM | POA: Diagnosis not present

## 2022-05-19 LAB — COMPREHENSIVE METABOLIC PANEL
ALT: 36 U/L — ABNORMAL HIGH (ref 0–35)
AST: 23 U/L (ref 0–37)
Albumin: 4.5 g/dL (ref 3.5–5.2)
Alkaline Phosphatase: 71 U/L (ref 39–117)
BUN: 14 mg/dL (ref 6–23)
CO2: 27 mEq/L (ref 19–32)
Calcium: 9.9 mg/dL (ref 8.4–10.5)
Chloride: 99 mEq/L (ref 96–112)
Creatinine, Ser: 0.59 mg/dL (ref 0.40–1.20)
GFR: 93.24 mL/min (ref >60.00)
Glucose, Bld: 181 mg/dL — ABNORMAL HIGH (ref 70–99)
Potassium: 4.3 mEq/L (ref 3.5–5.1)
Sodium: 138 mEq/L (ref 135–145)
Total Bilirubin: 0.3 mg/dL (ref 0.2–1.2)
Total Protein: 7.5 g/dL (ref 6.0–8.3)

## 2022-05-19 LAB — HEMOGLOBIN A1C: Hgb A1c MFr Bld: 7.8 % — ABNORMAL HIGH (ref 4.6–6.5)

## 2022-05-19 NOTE — Progress Notes (Signed)
Veronica Galvan is a 67 y.o. 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   Rosacea 08/18/2021   Actinic keratosis 08/18/2021   Sensorineural hearing loss (SNHL) of left ear with unrestricted hearing of right ear 07/26/2021   Tinnitus of left ear 07/26/2021   Acute recurrent maxillary sinusitis 06/22/2021   Primary hypertension 01/12/2021   Hyperlipidemia associated with type 2 diabetes mellitus ([place]) 01/12/2021   Arthritis 01/12/2021   Type II diabetes mellitus ([place]) 11/25/2019   Ingrown toenail 09/05/2018   Neck pain 01/29/2018   Tick bite 10/24/2017    Current Outpatient Medications  Medication Sig Dispense Refill   ALPRAZolam (XANAX) 0.5 MG tablet Take 1 tablet (0.5 mg total) by mouth at bedtime as needed for anxiety. 30 tablet 0   Ascorbic Acid (VITAMIN C) 1000 MG tablet Take 1,000 mg by mouth every morning.     aspirin EC 81 MG tablet Take 81 mg by mouth every morning.     atorvastatin (LIPITOR) 40 MG tablet Take 1 tablet (40 mg total) by mouth every evening. 90 tablet 3   Calcium Carb-Cholecalciferol (CALCIUM 600+D3 PO) Take 1 tablet by mouth every morning.     celecoxib (CELEBREX) 200 MG capsule Take 1 capsule (200 mg total) by mouth daily. 90 capsule 3   Cholecalciferol (VITAMIN D) 2000 units tablet Take 2,000 Units by mouth daily.     Coenzyme Q10 300 MG CAPS Take 1 capsule by mouth every morning.     diclofenac Sodium (VOLTAREN) 1 % GEL Apply topically 4 (four) times daily.     glucose blood test strip Use as instructed 100 each 12   levocetirizine (XYZAL) 5 MG tablet Take 1 tablet (5 mg total) by  mouth every evening. 90 tablet 3   lisinopril (ZESTRIL) 5 MG tablet Take 1 tablet (5 mg total) by mouth 2 (two) times daily. 180 tablet 3   LUTEIN-ZEAXANTHIN PO Take 1 tablet by mouth every morning.     Multiple Vitamins-Minerals (MULTIVITAMIN WITH MINERALS) tablet Take 1 tablet by mouth every morning.     Omega-3 Fatty Acids (EQL OMEGA 3 FISH OIL) 1400 MG CAPS Take 2 capsules by mouth daily.     omeprazole (PRILOSEC) 20 MG capsule Take 1 capsule (20 mg total) by mouth every morning. 90 capsule 3   Semaglutide, 2 MG/DOSE, 8 MG/3ML SOPN Inject 2 mg as directed once a week. 3 mL 0   SYNJARDY XR 12.08-998 MG TB24 Take 1 tablet by mouth 2 (two) times daily. 180 tablet 3   UNABLE TO FIND Med Name: Cinnamon/Cinsilin     VITAMIN E PO Take by mouth.     No current facility-administered medications for this visit.    Allergies: Codeine, Benzonatate, and Epinephrine  Past Medical History:  Diagnosis Date   Allergy    Anxiety    Arthritis    hands   Cataract    Diabetes mellitus without complication ([facility])    type 2   Family history of adverse reaction to anesthesia    son has malignant hyperthermia, daughter does not daughter recently had c section without problems   GERD (gastroesophageal reflux disease)  Headache    sinus   Hyperlipidemia    Hypertension    PONV (postoperative nausea and vomiting)    nausea only   Ulcer, stomach peptic yrs ago    Past Surgical History:  Procedure Laterality Date   APPENDECTOMY     both hells bone spur repair     both heels with metal clips   both shoulder rotator cuff repair     CESAREAN SECTION     x 1   COLONOSCOPY WITH PROPOFOL N/A 09/07/2016   Procedure: COLONOSCOPY WITH PROPOFOL;  Surgeon: Garlan Fair, MD;  Location: [facility];  Service: Endoscopy;  Laterality: N/A;   colonscopy  06/2011   polyps   EYE SURGERY     FRACTURE SURGERY     TUBAL LIGATION     VESICO-VAGINAL FISTULA REPAIR      Family History  Problem Relation Age  of Onset   Hypertension Mother    COPD Mother    Cancer Mother    Arthritis Mother    Hypertension Father    Hyperlipidemia Father    Diabetes Father    Cancer Father    Alcohol abuse Father    Diabetes Brother    Alcohol abuse Brother    Hypertension Brother    Arthritis Maternal Grandmother    Birth defects Maternal Grandmother    Diabetes Paternal Grandmother    Heart disease Paternal Grandfather    Diabetes Paternal Aunt    Diabetes Paternal Aunt    Obesity Son     Social History   Tobacco Use   Smoking status: Former    Packs/day: 1.00    Years: 14.00    Total pack years: 14.00    Types: Cigarettes   Smokeless tobacco: Never   Tobacco comments:    quit 31 yrs ago  Substance Use Topics   Alcohol use: Yes    Comment: rare    Subjective:   3 month follow up on Type 2 Diabetes; doing very well; water aerobics 3 days/ week and stretching class through the [facility]; continuing with regular PT;   Objective:  Vitals:   05/19/22 0842  BP: 122/74  Pulse: 94  Temp: (!) 97.4 F (36.3 C)  TempSrc: Oral  SpO2: 99%  Weight: 205 lb 6.4 oz (93.2 kg)  Height: '5\' 1"'$  (1.549 m)    General: Well developed, well nourished, in no acute distress  Skin : Warm and dry.  Head: Normocephalic and atraumatic  Lungs: Respirations unlabored; clear to auscultation bilaterally without wheeze, rales, rhonchi  CVS exam: normal rate and regular rhythm.  Neurologic: Alert and oriented; speech intact; face symmetrical; moves all extremities well; CNII-XII intact without focal deficit   Assessment:  1. Type 2 diabetes mellitus without complication, without long-term current use of insulin ([place])   2. Chronic neck pain     Plan:  Will update labs today; patient has been doing very well on current regimen and is now exercising 3-4 x per week; assuming labs are normal, plan to follow up in 6 months; Responding well to PT- will continue to sign orders as requested;   No follow-ups on file.   Orders Placed This Encounter  Procedures   Comp Met (CMET)   Hemoglobin A1c    Requested Prescriptions    No prescriptions requested or ordered in this encounter

## 2022-05-19 NOTE — Therapy (Signed)
OUTPATIENT PHYSICAL THERAPY CERVICAL TREATMENT    Patient Name: Veronica Galvan MRN: [id number] DOB:1955/11/12, 67 y.o., female Today's Date: 05/19/2022    Past Medical History:  Diagnosis Date   Allergy    Anxiety    Arthritis    hands   Cataract    Diabetes mellitus without complication ([place])    type 2   Family history of adverse reaction to anesthesia    son has malignant hyperthermia, daughter does not daughter recently had c section without problems   GERD (gastroesophageal reflux disease)    Headache    sinus   Hyperlipidemia    Hypertension    PONV (postoperative nausea and vomiting)    nausea only   Ulcer, stomach peptic yrs ago   Past Surgical History:  Procedure Laterality Date   APPENDECTOMY     both hells bone spur repair     both heels with metal clips   both shoulder rotator cuff repair     CESAREAN SECTION     x 1   COLONOSCOPY WITH PROPOFOL N/A 09/07/2016   Procedure: COLONOSCOPY WITH PROPOFOL;  Surgeon: Garlan Fair, MD;  Location: [facility];  Service: Endoscopy;  Laterality: N/A;   colonscopy  06/2011   polyps   EYE SURGERY     FRACTURE SURGERY     TUBAL LIGATION     VESICO-VAGINAL FISTULA REPAIR     Patient Active Problem List   Diagnosis Date Noted   Gastroesophageal reflux disease 11/16/2021   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 08/18/2021   Dermatofibroma 08/18/2021   History of malignant neoplasm of skin 08/18/2021   Lentigo 08/18/2021   Melanocytic nevi of trunk 08/18/2021   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   Rosacea 08/18/2021   Actinic keratosis 08/18/2021   Sensorineural hearing loss (SNHL) of left ear with unrestricted hearing of right ear 07/26/2021   Tinnitus of left ear 07/26/2021   Acute recurrent maxillary sinusitis 06/22/2021   Primary hypertension 01/12/2021   Hyperlipidemia associated with type 2 diabetes mellitus ([place]) 01/12/2021   Arthritis 01/12/2021   Type II  diabetes mellitus ([place]) 11/25/2019   Ingrown toenail 09/05/2018   Neck pain 01/29/2018   Tick bite 10/24/2017    PCP: Jodi Mourning  REFERRING PROVIDER: Genia Harold  REFERRING DIAG: M54.2  THERAPY DIAG:  Cervicalgia  Muscle weakness (generalized)  Chronic intractable headache, unspecified headache type  Rationale for Evaluation and Treatment Rehabilitation  ONSET DATE: 11/08/21  SUBJECTIVE:  SUBJECTIVE STATEMENT: Patient saw MD, very pleased her blood work looks better with some weight loss A1C down from 8.2 to 7.8  PERTINENT HISTORY:  Arthritis, HTN, bilateral rotator cuff surgery 15-60yr ago, DM  PAIN:  Are you having pain? Yes: NPRS scale: 7/10 Pain location: head and neck, shoulders  Pain description: tight  Aggravating factors: moving Relieving factors: massage, standing in the shower Reports that the 7/10 is much improved from the previous year which she rated a daily 8-10/10, reports qualith of life is much better but not where she needs or wants to be.  Still a daily HA  WEIGHT BEARING RESTRICTIONS No  FALLS:  Has patient fallen in last 6 months? No  LIVING ENVIRONMENT: Lives with: lives alone Lives in: House/apartment Stairs: Yes: External: 3 steps; can reach both Has following equipment at home: None  OCCUPATION: reitred  PLOF: Independent  PATIENT GOALS to loosen it up, find out what is causing my headaches   OBJECTIVE:   DIAGNOSTIC FINDINGS:   FINDINGS: Brain: No acute infarct, mass effect or extra-axial collection. No acute or chronic hemorrhage. Normal white matter signal, parenchymal volume and CSF spaces. The midline structures are normal.  Skull and upper cervical spine: Normal calvarium and skull base. Visualized upper cervical spine and  soft tissues are normal.   Sinuses/Orbits:No paranasal sinus fluid levels or advanced mucosal thickening. No mastoid or middle ear effusion. Normal orbits.   IMPRESSION: Normal brain MRI.  PATIENT SURVEYS:  FOTO 53/100 ,  April 08, 2022 has been up to 624and today was 568but has been doing a lot and lifting and is back doing much more than she was.  January 4,2024 57/100 she does report tha tshe wnet backwards from her previous due to her being able to do so much more but is hurting more   COGNITION: Overall cognitive status: Within functional limits for tasks assessed  SENSATION: WFL  POSTURE: rounded shoulders, forward head, and increased thoracic kyphosis  PALPATION: TTP and sore along upper trap and cervical paraspinals, trigger points in R and L UT   CERVICAL ROM:   Active ROM A/PROM (deg) eval 12/30/21 01/05/22 9/28 10/31 04/05/22  Flexion 100% no pain WFL WFL WNL   WNL  Extension Mild limitations w/[facility] W[facility]   WNL  Right lateral flexion Mod limitation w/pain Mild limitations Mild limitations Severe limitation, leans with trunk  Decreased 75%  Decreased 50%  Left lateral flexion Mod limitation w/pain Mod limitations Mild limitations Severe limitation, leans with trunk  Decreased 75% Decreased  50%  Right rotation 75% w/pain Mild limitation w/pain WFL pain at end range  Mild limitation  Decreased 25%  Decreased 25%  Left rotation 75% w/pain  Mild limitation w/pain  WFL pain at end range  Mild limitation  Decreased 25% Decreased  25%   (Blank rows = not tested)  UPPER EXTREMITY ROM: WFL, limited in IR and ER due to prior RC surgery years ago   UPPER EXTREMITY MMT:  MMT Right eval Left eval Right 01/05/22 Left 01/05/22 R 9/28 L 9/28  Shoulder flexion 4+ 4+ '5 5 5 5  '$ Shoulder extension        Shoulder abduction 4+ very painful 4+ 4+ with pain '5 5 5  '$ Shoulder adduction        Shoulder extension        Shoulder internal rotation 4+ 4+ '5 5 5 5  '$ Shoulder external  rotation     5 5  Middle trapezius        Lower trapezius        Elbow flexion 5 5      Elbow extension 5 5      Wrist flexion        Wrist extension        Wrist ulnar deviation        Wrist radial deviation        Wrist pronation        Wrist supination        Grip strength         (Blank rows = not tested) 05/05/22 LE MMT  right 4+/5, left 4/5  SLR 40 degrees with pain in the legs and the back  Cannot cross her legs due to tight piriformis 25 degrees hip ER  Abdominal strength 2/5 unable to sit up or hold at 40 degrees  5x STS 21 seconds    TODAY'S TREATMENT:  05/19/22 UBE level 4 x 6 minutes 10# straight arm pulls  25# seated rows 25# lats 40# leg press Passive Piriformis stretches STM with the theragun to the rihgt trap, neck and rhomboid  05/17/22 UBE level 4 x 6 minutes 10# straight arm pulls 15# AR 5# hip abduction and extension 25# rows 2x10 25# lats 2x10 10# chest press Leg curls 25# 2x10 Leg extension 10# 2x10 Leg press 50# 2x10 Feet on ball bridge, isometric abs and partial sit ups Passive HS and piriformis stretch  05/12/21 STM to the upper traps, neck and rhomboids, PROM, gentle stretches 5# hip extension and abducton 40# leg press 10# straight arm pulls 15# AR press Eccentric abs with assist  05/10/22 UBE level 4 x 6 minutes 10# straight arm pulls Leg curls 25# 2x10 Leg extension 10# 3x10 15# AR press 25# rows 25# lats Worked on getting up from the floor Stretch of the piriformis and the Hs a few different ways 20# farmer carry 2 laps each arm 4# carry arms out in front Partial sit ups, had to do eccentrics  05/05/22 Reassess STM to the right upper trap and neck Passive stretch  05/03/22 UBE level 3 x 6 minutes 10# straight arms 25# rows 25# lats 20# leg press 25# leg curls 5# leg extension Calf stretches Feet on ball K2C, trunk rotation, bridges, isometric abs HS and piriformis stretches Dead bug    PATIENT EDUCATION:   Education details: POC Person educated: Patient Education method: Explanation Education comprehension: verbalized understanding   HOME EXERCISE PROGRAM: Access Code: [email or web address] URL: [email or web address] Date: 12/01/2021 Prepared by: Andris Baumann   ASSESSMENT:  CLINICAL IMPRESSION: Patient continues to do well MD feels that her blood work looks better since we started the focus on exercise, she is very tight with the piriformis and cannot put socks on  REHAB POTENTIAL: Good  CLINICAL DECISION MAKING: Stable/uncomplicated  EVALUATION COMPLEXITY: Low   GOALS: Goals reviewed with patient? Yes  SHORT TERM GOALS: Target date: 12/29/21  Patient will be independent with initial HEP.  Goal status: MET  LONG TERM GOALS: Target date: 02/26/22  Patient will be independent with advanced/ongoing HEP to improve outcomes and carryover.  Goal status: IN PROGRESS  2.  Patient will report 75% improvement in neck pain to improve QOL.   Baseline: 9/28- not even 50% improvement maybe 40% Goal status: IN PROGRESS  3.  Patient will demonstrate full pain free cervical ROM to complete ADLs and play with grandchildren with more ease. Goal status: IN PROGRESS 9/28- better but still  limited (see above)  4.  Patient will demonstrate improved posture to decrease muscle imbalance and tightness.  Baseline: increased tightness along cervical paraspinals Goal status: progressing still tight piriformis  5.  Patient will report 27 on FOTO to demonstrate improved functional ability.  Baseline:  9/28- 64  Goal status: MET but has regressed as she is doing more and more  6.  Will demonstrate improved core strength as evidenced by passing double leg lower test  Baseline:  Goal status: progressing    PLAN: PT FREQUENCY: 2x/week  PT DURATION: 4 weeks  PLANNED INTERVENTIONS: Therapeutic exercises, Therapeutic activity, Neuromuscular re-education, Balance training, Gait training,  Patient/Family education, Self Care, Joint mobilization, Dry Needling, Spinal manipulation, Spinal mobilization, Cryotherapy, Moist heat, Taping, Vasopneumatic device, Traction, Ionotophoresis '4mg'$ /ml Dexamethasone, and Manual therapy  PLAN FOR NEXT SESSION:  continue with core strength, functional activities and the pain  Lum Babe, PT 05/19/2022, 3:40 PM   [place] [place], [place], [address] Phone: [phone]   Fax:  [phone]

## 2022-05-24 ENCOUNTER — Ambulatory Visit: Payer: Medicare Other | Admitting: Physical Therapy

## 2022-05-24 ENCOUNTER — Encounter: Payer: Self-pay | Admitting: Physical Therapy

## 2022-05-24 DIAGNOSIS — G8929 Other chronic pain: Secondary | ICD-10-CM | POA: Diagnosis not present

## 2022-05-24 DIAGNOSIS — R519 Headache, unspecified: Secondary | ICD-10-CM | POA: Diagnosis not present

## 2022-05-24 DIAGNOSIS — H9042 Sensorineural hearing loss, unilateral, left ear, with unrestricted hearing on the contralateral side: Secondary | ICD-10-CM | POA: Diagnosis not present

## 2022-05-24 DIAGNOSIS — M542 Cervicalgia: Secondary | ICD-10-CM | POA: Diagnosis not present

## 2022-05-24 DIAGNOSIS — M6281 Muscle weakness (generalized): Secondary | ICD-10-CM | POA: Diagnosis not present

## 2022-05-24 NOTE — Therapy (Signed)
OUTPATIENT PHYSICAL THERAPY CERVICAL TREATMENT    Patient Name: Veronica Galvan MRN: [id number] DOB:1956-01-12, 67 y.o., female Today's Date: 05/24/2022    Past Medical History:  Diagnosis Date   Allergy    Anxiety    Arthritis    hands   Cataract    Diabetes mellitus without complication ([place])    type 2   Family history of adverse reaction to anesthesia    son has malignant hyperthermia, daughter does not daughter recently had c section without problems   GERD (gastroesophageal reflux disease)    Headache    sinus   Hyperlipidemia    Hypertension    PONV (postoperative nausea and vomiting)    nausea only   Ulcer, stomach peptic yrs ago   Past Surgical History:  Procedure Laterality Date   APPENDECTOMY     both hells bone spur repair     both heels with metal clips   both shoulder rotator cuff repair     CESAREAN SECTION     x 1   COLONOSCOPY WITH PROPOFOL N/A 09/07/2016   Procedure: COLONOSCOPY WITH PROPOFOL;  Surgeon: Garlan Fair, MD;  Location: [facility];  Service: Endoscopy;  Laterality: N/A;   colonscopy  06/2011   polyps   EYE SURGERY     FRACTURE SURGERY     TUBAL LIGATION     VESICO-VAGINAL FISTULA REPAIR     Patient Active Problem List   Diagnosis Date Noted   Gastroesophageal reflux disease 11/16/2021   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 08/18/2021   Dermatofibroma 08/18/2021   History of malignant neoplasm of skin 08/18/2021   Lentigo 08/18/2021   Melanocytic nevi of trunk 08/18/2021   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   Rosacea 08/18/2021   Actinic keratosis 08/18/2021   Sensorineural hearing loss (SNHL) of left ear with unrestricted hearing of right ear 07/26/2021   Tinnitus of left ear 07/26/2021   Acute recurrent maxillary sinusitis 06/22/2021   Primary hypertension 01/12/2021   Hyperlipidemia associated with type 2 diabetes mellitus ([place]) 01/12/2021   Arthritis 01/12/2021   Type II  diabetes mellitus ([place]) 11/25/2019   Ingrown toenail 09/05/2018   Neck pain 01/29/2018   Tick bite 10/24/2017    PCP: Jodi Mourning  REFERRING PROVIDER: Genia Harold  REFERRING DIAG: M54.2  THERAPY DIAG:  Cervicalgia  Muscle weakness (generalized)  Chronic intractable headache, unspecified headache type  Rationale for Evaluation and Treatment Rehabilitation  ONSET DATE: 11/08/21  SUBJECTIVE:  SUBJECTIVE STATEMENT: Reports that she is grumpy, just sore and stiff, not feeling great  PERTINENT HISTORY:  Arthritis, HTN, bilateral rotator cuff surgery 15-41yr ago, DM  PAIN:  Are you having pain? Yes: NPRS scale: 7/10 Pain location: head and neck, shoulders  Pain description: tight  Aggravating factors: moving Relieving factors: massage, standing in the shower Reports that the 7/10 is much improved from the previous year which she rated a daily 8-10/10, reports qualith of life is much better but not where she needs or wants to be.  Still a daily HA  WEIGHT BEARING RESTRICTIONS No  FALLS:  Has patient fallen in last 6 months? No  LIVING ENVIRONMENT: Lives with: lives alone Lives in: House/apartment Stairs: Yes: External: 3 steps; can reach both Has following equipment at home: None  OCCUPATION: reitred  PLOF: Independent  PATIENT GOALS to loosen it up, find out what is causing my headaches   OBJECTIVE:   DIAGNOSTIC FINDINGS:   FINDINGS: Brain: No acute infarct, mass effect or extra-axial collection. No acute or chronic hemorrhage. Normal white matter signal, parenchymal volume and CSF spaces. The midline structures are normal.  Skull and upper cervical spine: Normal calvarium and skull base. Visualized upper cervical spine and soft tissues are normal.    Sinuses/Orbits:No paranasal sinus fluid levels or advanced mucosal thickening. No mastoid or middle ear effusion. Normal orbits.   IMPRESSION: Normal brain MRI.  PATIENT SURVEYS:  FOTO 53/100 ,  April 08, 2022 has been up to 68and today was 534but has been doing a lot and lifting and is back doing much more than she was.  January 4,2024 57/100 she does report tha tshe wnet backwards from her previous due to her being able to do so much more but is hurting more   COGNITION: Overall cognitive status: Within functional limits for tasks assessed  SENSATION: WFL  POSTURE: rounded shoulders, forward head, and increased thoracic kyphosis  PALPATION: TTP and sore along upper trap and cervical paraspinals, trigger points in R and L UT   CERVICAL ROM:   Active ROM A/PROM (deg) eval 12/30/21 01/05/22 9/28 10/31 04/05/22  Flexion 100% no pain WFL WFL WNL   WNL  Extension [facility] limitations [facility] W[facility] WNL   WNL  Right lateral flexion Mod limitation w/pain Mild limitations Mild limitations Severe limitation, leans with trunk  Decreased 75%  Decreased 50%  Left lateral flexion Mod limitation w/pain Mod limitations Mild limitations Severe limitation, leans with trunk  Decreased 75% Decreased  50%  Right rotation 75% w/pain Mild limitation w/pain WFL pain at end range  Mild limitation  Decreased 25%  Decreased 25%  Left rotation 75% w/pain  Mild limitation w/pain  WFL pain at end range  Mild limitation  Decreased 25% Decreased  25%   (Blank rows = not tested)  UPPER EXTREMITY ROM: WFL, limited in IR and ER due to prior RC surgery years ago   UPPER EXTREMITY MMT:  MMT Right eval Left eval Right 01/05/22 Left 01/05/22 R 9/28 L 9/28  Shoulder flexion 4+ 4+ '5 5 5 5  '$ Shoulder extension        Shoulder abduction 4+ very painful 4+ 4+ with pain '5 5 5  '$ Shoulder adduction        Shoulder extension        Shoulder internal rotation 4+ 4+ '5 5 5 5  '$ Shoulder external rotation     5 5  Middle  trapezius  Lower trapezius        Elbow flexion 5 5      Elbow extension 5 5      Wrist flexion        Wrist extension        Wrist ulnar deviation        Wrist radial deviation        Wrist pronation        Wrist supination        Grip strength         (Blank rows = not tested) 05/05/22 LE MMT  right 4+/5, left 4/5  SLR 40 degrees with pain in the legs and the back  Cannot cross her legs due to tight piriformis 25 degrees hip ER  Abdominal strength 2/5 unable to sit up or hold at 40 degrees  5x STS 21 seconds    TODAY'S TREATMENT:  05/24/22 UBE level 4 x 6 minutes Leg curls 25# Leg extension 10# Triceps 25# 2x10 Biceps 10# 2x10 10# straight arm pulls 15# AR press LEg press 40# 3x10 Volleyball Passive stretch LE and upper trap, levator  05/19/22 UBE level 4 x 6 minutes 10# straight arm pulls  25# seated rows 25# lats 40# leg press Passive Piriformis stretches STM with the theragun to the rihgt trap, neck and rhomboid  05/17/22 UBE level 4 x 6 minutes 10# straight arm pulls 15# AR 5# hip abduction and extension 25# rows 2x10 25# lats 2x10 10# chest press Leg curls 25# 2x10 Leg extension 10# 2x10 Leg press 50# 2x10 Feet on ball bridge, isometric abs and partial sit ups Passive HS and piriformis stretch  05/12/21 STM to the upper traps, neck and rhomboids, PROM, gentle stretches 5# hip extension and abducton 40# leg press 10# straight arm pulls 15# AR press Eccentric abs with assist  05/10/22 UBE level 4 x 6 minutes 10# straight arm pulls Leg curls 25# 2x10 Leg extension 10# 3x10 15# AR press 25# rows 25# lats Worked on getting up from the floor Stretch of the piriformis and the Hs a few different ways 20# farmer carry 2 laps each arm 4# carry arms out in front Partial sit ups, had to do eccentrics  05/05/22 Reassess STM to the right upper trap and neck Passive stretch  05/03/22 UBE level 3 x 6 minutes 10# straight arms 25# rows 25#  lats 20# leg press 25# leg curls 5# leg extension Calf stretches Feet on ball K2C, trunk rotation, bridges, isometric abs HS and piriformis stretches Dead bug    PATIENT EDUCATION:  Education details: POC Person educated: Patient Education method: Explanation Education comprehension: verbalized understanding   HOME EXERCISE PROGRAM: Access Code: [email or web address] URL: [email or web address] Date: 12/01/2021 Prepared by: Andris Baumann   ASSESSMENT:  CLINICAL IMPRESSION: Patient having a bad day today is still wanting to work hard, still trying to activate the core and started some work on balance, she did well with the balance, very tight in the piriformis, abs are getting stronger she can get up from the bed without help most of the time  REHAB POTENTIAL: Good  CLINICAL DECISION MAKING: Stable/uncomplicated  EVALUATION COMPLEXITY: Low   GOALS: Goals reviewed with patient? Yes  SHORT TERM GOALS: Target date: 12/29/21  Patient will be independent with initial HEP.  Goal status: MET  LONG TERM GOALS: Target date: 02/26/22  Patient will be independent with advanced/ongoing HEP to improve outcomes and carryover.  Goal status: IN PROGRESS  2.  Patient will report 75%  improvement in neck pain to improve QOL.   Baseline: 9/28- not even 50% improvement maybe 40% Goal status: IN PROGRESS  3.  Patient will demonstrate full pain free cervical ROM to complete ADLs and play with grandchildren with more ease. Goal status: IN PROGRESS 9/28- better but still limited (see above)  4.  Patient will demonstrate improved posture to decrease muscle imbalance and tightness.  Baseline: increased tightness along cervical paraspinals Goal status: progressing still tight piriformis  5.  Patient will report 49 on FOTO to demonstrate improved functional ability.  Baseline:  9/28- 64  Goal status: MET but has regressed as she is doing more and more  6.  Will demonstrate improved core  strength as evidenced by passing double leg lower test  Baseline:  Goal status: progressing    PLAN: PT FREQUENCY: 2x/week  PT DURATION: 4 weeks  PLANNED INTERVENTIONS: Therapeutic exercises, Therapeutic activity, Neuromuscular re-education, Balance training, Gait training, Patient/Family education, Self Care, Joint mobilization, Dry Needling, Spinal manipulation, Spinal mobilization, Cryotherapy, Moist heat, Taping, Vasopneumatic device, Traction, Ionotophoresis '4mg'$ /ml Dexamethasone, and Manual therapy  PLAN FOR NEXT SESSION:  continue with core strength, functional activities , flexibility and pain  Lum Babe, PT 05/24/2022, 8:47 AM   [place] [facility] [place]. [place], [place], [address] Phone: [phone]   Fax:  [phone]

## 2022-05-26 ENCOUNTER — Encounter: Payer: Self-pay | Admitting: Physical Therapy

## 2022-05-26 ENCOUNTER — Ambulatory Visit: Payer: Medicare Other | Admitting: Physical Therapy

## 2022-05-26 DIAGNOSIS — G8929 Other chronic pain: Secondary | ICD-10-CM | POA: Diagnosis not present

## 2022-05-26 DIAGNOSIS — R519 Headache, unspecified: Secondary | ICD-10-CM | POA: Diagnosis not present

## 2022-05-26 DIAGNOSIS — M6281 Muscle weakness (generalized): Secondary | ICD-10-CM

## 2022-05-26 DIAGNOSIS — M542 Cervicalgia: Secondary | ICD-10-CM

## 2022-05-26 NOTE — Therapy (Signed)
OUTPATIENT PHYSICAL THERAPY CERVICAL TREATMENT    Patient Name: Veronica Galvan MRN: [id number] DOB:Feb 19, 1956, 67 y.o., female Today's Date: 05/26/2022    Past Medical History:  Diagnosis Date   Allergy    Anxiety    Arthritis    hands   Cataract    Diabetes mellitus without complication ([place])    type 2   Family history of adverse reaction to anesthesia    son has malignant hyperthermia, daughter does not daughter recently had c section without problems   GERD (gastroesophageal reflux disease)    Headache    sinus   Hyperlipidemia    Hypertension    PONV (postoperative nausea and vomiting)    nausea only   Ulcer, stomach peptic yrs ago   Past Surgical History:  Procedure Laterality Date   APPENDECTOMY     both hells bone spur repair     both heels with metal clips   both shoulder rotator cuff repair     CESAREAN SECTION     x 1   COLONOSCOPY WITH PROPOFOL N/A 09/07/2016   Procedure: COLONOSCOPY WITH PROPOFOL;  Surgeon: Garlan Fair, MD;  Location: [facility];  Service: Endoscopy;  Laterality: N/A;   colonscopy  06/2011   polyps   EYE SURGERY     FRACTURE SURGERY     TUBAL LIGATION     VESICO-VAGINAL FISTULA REPAIR     Patient Active Problem List   Diagnosis Date Noted   Gastroesophageal reflux disease 11/16/2021   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 08/18/2021   Dermatofibroma 08/18/2021   History of malignant neoplasm of skin 08/18/2021   Lentigo 08/18/2021   Melanocytic nevi of trunk 08/18/2021   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   Rosacea 08/18/2021   Actinic keratosis 08/18/2021   Sensorineural hearing loss (SNHL) of left ear with unrestricted hearing of right ear 07/26/2021   Tinnitus of left ear 07/26/2021   Acute recurrent maxillary sinusitis 06/22/2021   Primary hypertension 01/12/2021   Hyperlipidemia associated with type 2 diabetes mellitus ([place]) 01/12/2021   Arthritis 01/12/2021   Type II  diabetes mellitus ([place]) 11/25/2019   Ingrown toenail 09/05/2018   Neck pain 01/29/2018   Tick bite 10/24/2017    PCP: Jodi Mourning  REFERRING PROVIDER: Genia Harold  REFERRING DIAG: M54.2  THERAPY DIAG:  Cervicalgia  Muscle weakness (generalized)  Chronic intractable headache, unspecified headache type  Rationale for Evaluation and Treatment Rehabilitation  ONSET DATE: 11/08/21  SUBJECTIVE:  SUBJECTIVE STATEMENT: Patient reports worse today reports weather irritates her and has a worse HA today 10/10  PERTINENT HISTORY:  Arthritis, HTN, bilateral rotator cuff surgery 15-13yr ago, DM  PAIN:  Are you having pain? Yes: NPRS scale: 10/10 Pain location: head and neck, shoulders  Pain description: tight  Aggravating factors: moving Relieving factors: massage, standing in the shower Reports that the 7/10 is much improved from the previous year which she rated a daily 8-10/10, reports qualith of life is much better but not where she needs or wants to be.  Still a daily HA  WEIGHT BEARING RESTRICTIONS No  FALLS:  Has patient fallen in last 6 months? No  LIVING ENVIRONMENT: Lives with: lives alone Lives in: House/apartment Stairs: Yes: External: 3 steps; can reach both Has following equipment at home: None  OCCUPATION: reitred  PLOF: Independent  PATIENT GOALS to loosen it up, find out what is causing my headaches   OBJECTIVE:   DIAGNOSTIC FINDINGS:   FINDINGS: Brain: No acute infarct, mass effect or extra-axial collection. No acute or chronic hemorrhage. Normal white matter signal, parenchymal volume and CSF spaces. The midline structures are normal.  Skull and upper cervical spine: Normal calvarium and skull base. Visualized upper cervical spine and soft tissues  are normal.   Sinuses/Orbits:No paranasal sinus fluid levels or advanced mucosal thickening. No mastoid or middle ear effusion. Normal orbits.   IMPRESSION: Normal brain MRI.  PATIENT SURVEYS:  FOTO 53/100 ,  April 08, 2022 has been up to 629and today was 514but has been doing a lot and lifting and is back doing much more than she was.  January 4,2024 57/100 she does report tha tshe wnet backwards from her previous due to her being able to do so much more but is hurting more   COGNITION: Overall cognitive status: Within functional limits for tasks assessed  SENSATION: WFL  POSTURE: rounded shoulders, forward head, and increased thoracic kyphosis  PALPATION: TTP and sore along upper trap and cervical paraspinals, trigger points in R and L UT   CERVICAL ROM:   Active ROM A/PROM (deg) eval 12/30/21 01/05/22 9/28 10/31 04/05/22  Flexion 100% no pain WFL WFL WNL   WNL  Extension [facility] limitations [facility] W[facility] WNL   WNL  Right lateral flexion Mod limitation w/pain Mild limitations Mild limitations Severe limitation, leans with trunk  Decreased 75%  Decreased 50%  Left lateral flexion Mod limitation w/pain Mod limitations Mild limitations Severe limitation, leans with trunk  Decreased 75% Decreased  50%  Right rotation 75% w/pain Mild limitation w/pain WFL pain at end range  Mild limitation  Decreased 25%  Decreased 25%  Left rotation 75% w/pain  Mild limitation w/pain  WFL pain at end range  Mild limitation  Decreased 25% Decreased  25%   (Blank rows = not tested)  UPPER EXTREMITY ROM: WFL, limited in IR and ER due to prior RC surgery years ago   UPPER EXTREMITY MMT:  MMT Right eval Left eval Right 01/05/22 Left 01/05/22 R 9/28 L 9/28  Shoulder flexion 4+ 4+ '5 5 5 5  '$ Shoulder extension        Shoulder abduction 4+ very painful 4+ 4+ with pain '5 5 5  '$ Shoulder adduction        Shoulder extension        Shoulder internal rotation 4+ 4+ '5 5 5 5  '$ Shoulder external rotation     5  5  Middle trapezius  Lower trapezius        Elbow flexion 5 5      Elbow extension 5 5      Wrist flexion        Wrist extension        Wrist ulnar deviation        Wrist radial deviation        Wrist pronation        Wrist supination        Grip strength         (Blank rows = not tested) 05/05/22 LE MMT  right 4+/5, left 4/5  SLR 40 degrees with pain in the legs and the back  Cannot cross her legs due to tight piriformis 25 degrees hip ER  Abdominal strength 2/5 unable to sit up or hold at 40 degrees  5x STS 21 seconds    TODAY'S TREATMENT:  05/26/22 UBE LEvel 4 x 6 minutes 15# AR press 10# straight arm pulls Leg curls 25# Leg extension 5# Volleyball Volleyball on the airex Airex side step on and off PAssive stretch to the LE's  05/24/22 UBE level 4 x 6 minutes Leg curls 25# Leg extension 10# Triceps 25# 2x10 Biceps 10# 2x10 10# straight arm pulls 15# AR press LEg press 40# 3x10 Volleyball Passive stretch LE and upper trap, levator  05/19/22 UBE level 4 x 6 minutes 10# straight arm pulls  25# seated rows 25# lats 40# leg press Passive Piriformis stretches STM with the theragun to the rihgt trap, neck and rhomboid  05/17/22 UBE level 4 x 6 minutes 10# straight arm pulls 15# AR 5# hip abduction and extension 25# rows 2x10 25# lats 2x10 10# chest press Leg curls 25# 2x10 Leg extension 10# 2x10 Leg press 50# 2x10 Feet on ball bridge, isometric abs and partial sit ups Passive HS and piriformis stretch  05/12/21 STM to the upper traps, neck and rhomboids, PROM, gentle stretches 5# hip extension and abducton 40# leg press 10# straight arm pulls 15# AR press Eccentric abs with assist  05/10/22 UBE level 4 x 6 minutes 10# straight arm pulls Leg curls 25# 2x10 Leg extension 10# 3x10 15# AR press 25# rows 25# lats Worked on getting up from the floor Stretch of the piriformis and the Hs a few different ways 20# farmer carry 2 laps each arm 4#  carry arms out in front Partial sit ups, had to do eccentrics    PATIENT EDUCATION:  Education details: POC Person educated: Patient Education method: Explanation Education comprehension: verbalized understanding   HOME EXERCISE PROGRAM: Access Code: [email or web address] URL: [email or web address] Date: 12/01/2021 Prepared by: Andris Baumann   ASSESSMENT:  CLINICAL IMPRESSION: Patient with HA today, reports 10/10, agreeable to exercise, did well with this, after treatment reported HA was a little better, still very tight in the LE and the trunk with her movements  REHAB POTENTIAL: Good  CLINICAL DECISION MAKING: Stable/uncomplicated  EVALUATION COMPLEXITY: Low   GOALS: Goals reviewed with patient? Yes  SHORT TERM GOALS: Target date: 12/29/21  Patient will be independent with initial HEP.  Goal status: MET  LONG TERM GOALS: Target date: 02/26/22  Patient will be independent with advanced/ongoing HEP to improve outcomes and carryover.  Goal status: IN PROGRESS  2.  Patient will report 75% improvement in neck pain to improve QOL.   Baseline: 9/28- not even 50% improvement maybe 40% Goal status: IN PROGRESS  3.  Patient will demonstrate full pain free cervical ROM to complete ADLs and  play with grandchildren with more ease. Goal status: met 4.  Patient will demonstrate improved posture to decrease muscle imbalance and tightness.  Baseline: increased tightness along cervical paraspinals Goal status: progressing still tight piriformis  5.  Patient will report 65 on FOTO to demonstrate improved functional ability.  Baseline:  9/28- 64  Goal status: MET but has regressed as she is doing more and more  6.  Will demonstrate improved core strength as evidenced by passing double leg lower test  Baseline:  Goal status: progressing    PLAN: PT FREQUENCY: 2x/week  PT DURATION: 4 weeks  PLANNED INTERVENTIONS: Therapeutic exercises, Therapeutic activity, Neuromuscular  re-education, Balance training, Gait training, Patient/Family education, Self Care, Joint mobilization, Dry Needling, Spinal manipulation, Spinal mobilization, Cryotherapy, Moist heat, Taping, Vasopneumatic device, Traction, Ionotophoresis '4mg'$ /ml Dexamethasone, and Manual therapy  PLAN FOR NEXT SESSION:  continue with core strength, functional activities , flexibility and pain  Lum Babe, PT 05/26/2022, 4:16 PM   [facility]. [place], [place], [address] Phone: [phone]   Fax:  [phone]

## 2022-05-30 ENCOUNTER — Ambulatory Visit (HOSPITAL_BASED_OUTPATIENT_CLINIC_OR_DEPARTMENT_OTHER)
Admission: RE | Admit: 2022-05-30 | Discharge: 2022-05-30 | Disposition: A | Discharge status: Home / Self Care | Payer: Medicare Other | Source: Ambulatory Visit | Attending: Family | Admitting: Family

## 2022-05-30 ENCOUNTER — Encounter (HOSPITAL_BASED_OUTPATIENT_CLINIC_OR_DEPARTMENT_OTHER): Payer: Self-pay

## 2022-05-30 DIAGNOSIS — Z1231 Encounter for screening mammogram for malignant neoplasm of breast: Secondary | ICD-10-CM | POA: Insufficient documentation

## 2022-05-31 ENCOUNTER — Ambulatory Visit: Payer: Medicare Other | Admitting: Psychiatry

## 2022-05-31 ENCOUNTER — Encounter: Payer: Self-pay | Admitting: Physical Therapy

## 2022-05-31 ENCOUNTER — Ambulatory Visit: Payer: Medicare Other | Admitting: Physical Therapy

## 2022-05-31 DIAGNOSIS — R519 Headache, unspecified: Secondary | ICD-10-CM | POA: Diagnosis not present

## 2022-05-31 DIAGNOSIS — G8929 Other chronic pain: Secondary | ICD-10-CM | POA: Diagnosis not present

## 2022-05-31 DIAGNOSIS — M542 Cervicalgia: Secondary | ICD-10-CM

## 2022-05-31 DIAGNOSIS — M6281 Muscle weakness (generalized): Secondary | ICD-10-CM

## 2022-05-31 NOTE — Therapy (Signed)
OUTPATIENT PHYSICAL THERAPY CERVICAL TREATMENT  Progress Note Reporting Period 05/03/22 to 05/31/22 for visits 41-50  See note below for Objective Data and Assessment of Progress/Goals.      Patient Name: Veronica Galvan MRN: [id number] DOB:Aug 01, 1955, 67 y.o., female Today's Date: 05/31/2022    Past Medical History:  Diagnosis Date   Allergy    Anxiety    Arthritis    hands   Cataract    Diabetes mellitus without complication ([place])    type 2   Family history of adverse reaction to anesthesia    son has malignant hyperthermia, daughter does not daughter recently had c section without problems   GERD (gastroesophageal reflux disease)    Headache    sinus   Hyperlipidemia    Hypertension    PONV (postoperative nausea and vomiting)    nausea only   Ulcer, stomach peptic yrs ago   Past Surgical History:  Procedure Laterality Date   APPENDECTOMY     both hells bone spur repair     both heels with metal clips   both shoulder rotator cuff repair     CESAREAN SECTION     x 1   COLONOSCOPY WITH PROPOFOL N/A 09/07/2016   Procedure: COLONOSCOPY WITH PROPOFOL;  Surgeon: Garlan Fair, MD;  Location: [facility];  Service: Endoscopy;  Laterality: N/A;   colonscopy  06/2011   polyps   EYE SURGERY     FRACTURE SURGERY     TUBAL LIGATION     VESICO-VAGINAL FISTULA REPAIR     Patient Active Problem List   Diagnosis Date Noted   Gastroesophageal reflux disease 11/16/2021   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 08/18/2021   Dermatofibroma 08/18/2021   History of malignant neoplasm of skin 08/18/2021   Lentigo 08/18/2021   Melanocytic nevi of trunk 08/18/2021   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   Rosacea 08/18/2021   Actinic keratosis 08/18/2021   Sensorineural hearing loss (SNHL) of left ear with unrestricted hearing of right ear 07/26/2021   Tinnitus of left ear 07/26/2021   Acute recurrent maxillary sinusitis 06/22/2021    Primary hypertension 01/12/2021   Hyperlipidemia associated with type 2 diabetes mellitus ([place]) 01/12/2021   Arthritis 01/12/2021   Type II diabetes mellitus ([place]) 11/25/2019   Ingrown toenail 09/05/2018   Neck pain 01/29/2018   Tick bite 10/24/2017    PCP: Jodi Mourning  REFERRING PROVIDER: Genia Harold  REFERRING DIAG: M54.2  THERAPY DIAG:  Cervicalgia  Muscle weakness (generalized)  Chronic intractable headache, unspecified headache type  Rationale for Evaluation and Treatment Rehabilitation  ONSET DATE: 11/08/21  SUBJECTIVE:  SUBJECTIVE STATEMENT: Patient reports HA really bad last week today an 8/10, she will be getting a massage later today. PERTINENT HISTORY:  Arthritis, HTN, bilateral rotator cuff surgery 15-63yr ago, DM  PAIN:  Are you having pain? Yes: NPRS scale: 08/10 Pain location: head and neck, shoulders  Pain description: tight  Aggravating factors: moving Relieving factors: massage, standing in the shower  WEIGHT BEARING RESTRICTIONS No  FALLS:  Has patient fallen in last 6 months? No  LIVING ENVIRONMENT: Lives with: lives alone Lives in: House/apartment Stairs: Yes: External: 3 steps; can reach both Has following equipment at home: None  OCCUPATION: reitred  PLOF: Independent  PATIENT GOALS to loosen it up, find out what is causing my headaches   OBJECTIVE:   DIAGNOSTIC FINDINGS:   FINDINGS: Brain: No acute infarct, mass effect or extra-axial collection. No acute or chronic hemorrhage. Normal white matter signal, parenchymal volume and CSF spaces. The midline structures are normal.  Skull and upper cervical spine: Normal calvarium and skull base. Visualized upper cervical spine and soft tissues are normal.   Sinuses/Orbits:No paranasal  sinus fluid levels or advanced mucosal thickening. No mastoid or middle ear effusion. Normal orbits.   IMPRESSION: Normal brain MRI.  PATIENT SURVEYS:  FOTO 53/100 ,  April 08, 2022 has been up to 615and today was 541but has been doing a lot and lifting and is back doing much more than she was.  January 4,2024 57/100 she does report tha tshe wnet backwards from her previous due to her being able to do so much more but is hurting more   COGNITION: Overall cognitive status: Within functional limits for tasks assessed  SENSATION: WFL  POSTURE: rounded shoulders, forward head, and increased thoracic kyphosis  PALPATION: TTP and sore along upper trap and cervical paraspinals, trigger points in R and L UT   CERVICAL ROM:   Active ROM A/PROM (deg) eval 12/30/21 01/05/22 9/28 10/31 04/05/22  Flexion 100% no pain WFL WFL WNL   WNL  Extension Mild limitations w/pain [facility] WFL WNL   WNL  Right lateral flexion Mod limitation w/pain Mild limitations Mild limitations Severe limitation, leans with trunk  Decreased 75%  Decreased 50%  Left lateral flexion Mod limitation w/pain Mod limitations Mild limitations Severe limitation, leans with trunk  Decreased 75% Decreased  50%  Right rotation 75% w/pain Mild limitation w/pain WFL pain at end range  Mild limitation  Decreased 25%  Decreased 25%  Left rotation 75% w/pain  Mild limitation w/pain  WFL pain at end range  Mild limitation  Decreased 25% Decreased  25%   (Blank rows = not tested)  UPPER EXTREMITY ROM: WFL, limited in IR and ER due to prior RC surgery years ago   UPPER EXTREMITY MMT:  MMT Right eval Left eval Right 01/05/22 Left 01/05/22 R 9/28 L 9/28  Shoulder flexion 4+ 4+ '5 5 5 5  '$ Shoulder extension        Shoulder abduction 4+ very painful 4+ 4+ with pain '5 5 5  '$ Shoulder adduction        Shoulder extension        Shoulder internal rotation 4+ 4+ '5 5 5 5  '$ Shoulder external rotation     5 5  Middle trapezius        Lower  trapezius        Elbow flexion 5 5      Elbow extension 5 5      Wrist flexion  Wrist extension        Wrist ulnar deviation        Wrist radial deviation        Wrist pronation        Wrist supination        Grip strength         (Blank rows = not tested) 05/05/22 LE MMT  right 4+/5, left 4/5  SLR 40 degrees with pain in the legs and the back  Cannot cross her legs due to tight piriformis 25 degrees hip ER  Abdominal strength 2/5 unable to sit up or hold at 40 degrees  5x STS 21 seconds    TODAY'S TREATMENT:  05/31/22 UBE level 4 x 6 minutes 10# straight arm pulls 15# AR press 25# triceps 10# biceps 2x10 5# hip extension and abduction 5# Hip flexion Seated row 25# Lats 25# Quadraped alternating arms and legs Plank 10 seconds x 2 cues for breathing PROM LE's  05/26/22 UBE LEvel 4 x 6 minutes 15# AR press 10# straight arm pulls Leg curls 25# Leg extension 5# Volleyball Volleyball on the airex Airex side step on and off PAssive stretch to the LE's  05/24/22 UBE level 4 x 6 minutes Leg curls 25# Leg extension 10# Triceps 25# 2x10 Biceps 10# 2x10 10# straight arm pulls 15# AR press LEg press 40# 3x10 Volleyball Passive stretch LE and upper trap, levator  05/19/22 UBE level 4 x 6 minutes 10# straight arm pulls  25# seated rows 25# lats 40# leg press Passive Piriformis stretches STM with the theragun to the rihgt trap, neck and rhomboid  05/17/22 UBE level 4 x 6 minutes 10# straight arm pulls 15# AR 5# hip abduction and extension 25# rows 2x10 25# lats 2x10 10# chest press Leg curls 25# 2x10 Leg extension 10# 2x10 Leg press 50# 2x10 Feet on ball bridge, isometric abs and partial sit ups Passive HS and piriformis stretch  05/12/21 STM to the upper traps, neck and rhomboids, PROM, gentle stretches 5# hip extension and abducton 40# leg press 10# straight arm pulls 15# AR press Eccentric abs with assist  05/10/22 UBE level 4 x 6 minutes 10#  straight arm pulls Leg curls 25# 2x10 Leg extension 10# 3x10 15# AR press 25# rows 25# lats Worked on getting up from the floor Stretch of the piriformis and the Hs a few different ways 20# farmer carry 2 laps each arm 4# carry arms out in front Partial sit ups, had to do eccentrics    PATIENT EDUCATION:  Education details: POC Person educated: Patient Education method: Explanation Education comprehension: verbalized understanding   HOME EXERCISE PROGRAM: Access Code: [email or web address] URL: [email or web address] Date: 12/01/2021 Prepared by: Andris Baumann   ASSESSMENT:  CLINICAL IMPRESSION: Patient continues to have higher pain rating of HA but less than last week, tried to work on the hips, core.  She struggled with the hips with 5## and also did the plank and some quadraped exercises  REHAB POTENTIAL: Good  CLINICAL DECISION MAKING: Stable/uncomplicated  EVALUATION COMPLEXITY: Low   GOALS: Goals reviewed with patient? Yes  SHORT TERM GOALS: Target date: 12/29/21  Patient will be independent with initial HEP.  Goal status: MET  LONG TERM GOALS: Target date: 02/26/22  Patient will be independent with advanced/ongoing HEP to improve outcomes and carryover.  Goal status: IN PROGRESS  2.  Patient will report 75% improvement in neck pain to improve QOL.   Baseline: 9/28- not even 50% improvement maybe 40% Goal status:  IN PROGRESS  3.  Patient will demonstrate full pain free cervical ROM to complete ADLs and play with grandchildren with more ease. Goal status: met 4.  Patient will demonstrate improved posture to decrease muscle imbalance and tightness.  Baseline: increased tightness along cervical paraspinals Goal status: progressing still tight piriformis  5.  Patient will report 62 on FOTO to demonstrate improved functional ability.  Baseline:  9/28- 64  Goal status: MET but has regressed as she is doing more and more  6.  Will demonstrate improved core  strength as evidenced by passing double leg lower test  Baseline:  Goal status: progressing    PLAN: PT FREQUENCY: 2x/week  PT DURATION: 4 weeks  PLANNED INTERVENTIONS: Therapeutic exercises, Therapeutic activity, Neuromuscular re-education, Balance training, Gait training, Patient/Family education, Self Care, Joint mobilization, Dry Needling, Spinal manipulation, Spinal mobilization, Cryotherapy, Moist heat, Taping, Vasopneumatic device, Traction, Ionotophoresis '4mg'$ /ml Dexamethasone, and Manual therapy  PLAN FOR NEXT SESSION:  continue with core strength, functional activities , flexibility and pain  Lum Babe, PT 05/31/2022, 8:45 AM   [facility]. [place], [place], [address] Phone: [phone]   Fax:  [phone]

## 2022-06-02 ENCOUNTER — Ambulatory Visit: Payer: Medicare Other | Attending: Psychiatry | Admitting: Physical Therapy

## 2022-06-02 DIAGNOSIS — R519 Headache, unspecified: Secondary | ICD-10-CM | POA: Insufficient documentation

## 2022-06-02 DIAGNOSIS — M6281 Muscle weakness (generalized): Secondary | ICD-10-CM | POA: Diagnosis not present

## 2022-06-02 DIAGNOSIS — G8929 Other chronic pain: Secondary | ICD-10-CM | POA: Insufficient documentation

## 2022-06-02 DIAGNOSIS — M542 Cervicalgia: Secondary | ICD-10-CM | POA: Insufficient documentation

## 2022-06-02 NOTE — Therapy (Signed)
OUTPATIENT PHYSICAL THERAPY CERVICAL TREATMENT    Patient Name: Veronica Galvan MRN: [id number] DOB:June 15, 1955, 67 y.o., female Today's Date: 06/02/2022    Past Medical History:  Diagnosis Date   Allergy    Anxiety    Arthritis    hands   Cataract    Diabetes mellitus without complication ([place])    type 2   Family history of adverse reaction to anesthesia    son has malignant hyperthermia, daughter does not daughter recently had c section without problems   GERD (gastroesophageal reflux disease)    Headache    sinus   Hyperlipidemia    Hypertension    PONV (postoperative nausea and vomiting)    nausea only   Ulcer, stomach peptic yrs ago   Past Surgical History:  Procedure Laterality Date   APPENDECTOMY     both hells bone spur repair     both heels with metal clips   both shoulder rotator cuff repair     CESAREAN SECTION     x 1   COLONOSCOPY WITH PROPOFOL N/A 09/07/2016   Procedure: COLONOSCOPY WITH PROPOFOL;  Surgeon: Garlan Fair, MD;  Location: [facility];  Service: Endoscopy;  Laterality: N/A;   colonscopy  06/2011   polyps   EYE SURGERY     FRACTURE SURGERY     TUBAL LIGATION     VESICO-VAGINAL FISTULA REPAIR     Patient Active Problem List   Diagnosis Date Noted   Gastroesophageal reflux disease 11/16/2021   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 08/18/2021   Dermatofibroma 08/18/2021   History of malignant neoplasm of skin 08/18/2021   Lentigo 08/18/2021   Melanocytic nevi of trunk 08/18/2021   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   Rosacea 08/18/2021   Actinic keratosis 08/18/2021   Sensorineural hearing loss (SNHL) of left ear with unrestricted hearing of right ear 07/26/2021   Tinnitus of left ear 07/26/2021   Acute recurrent maxillary sinusitis 06/22/2021   Primary hypertension 01/12/2021   Hyperlipidemia associated with type 2 diabetes mellitus ([place]) 01/12/2021   Arthritis 01/12/2021   Type II  diabetes mellitus ([place]) 11/25/2019   Ingrown toenail 09/05/2018   Neck pain 01/29/2018   Tick bite 10/24/2017    PCP: Jodi Mourning  REFERRING PROVIDER: Genia Harold  REFERRING DIAG: M54.2  THERAPY DIAG:  Cervicalgia  Muscle weakness (generalized)  Chronic intractable headache, unspecified headache type  Rationale for Evaluation and Treatment Rehabilitation  ONSET DATE: 11/08/21  SUBJECTIVE:  SUBJECTIVE STATEMENT: Doing pretty well still HA, massage did not help much PERTINENT HISTORY:  Arthritis, HTN, bilateral rotator cuff surgery 15-6yr ago, DM  PAIN:  Are you having pain? Yes: NPRS scale: 08/10 Pain location: head and neck, shoulders  Pain description: tight  Aggravating factors: moving Relieving factors: massage, standing in the shower  WEIGHT BEARING RESTRICTIONS No  FALLS:  Has patient fallen in last 6 months? No  LIVING ENVIRONMENT: Lives with: lives alone Lives in: House/apartment Stairs: Yes: External: 3 steps; can reach both Has following equipment at home: None  OCCUPATION: reitred  PLOF: Independent  PATIENT GOALS to loosen it up, find out what is causing my headaches   OBJECTIVE:   DIAGNOSTIC FINDINGS:   FINDINGS: Brain: No acute infarct, mass effect or extra-axial collection. No acute or chronic hemorrhage. Normal white matter signal, parenchymal volume and CSF spaces. The midline structures are normal.  Skull and upper cervical spine: Normal calvarium and skull base. Visualized upper cervical spine and soft tissues are normal.   Sinuses/Orbits:No paranasal sinus fluid levels or advanced mucosal thickening. No mastoid or middle ear effusion. Normal orbits.   IMPRESSION: Normal brain MRI.  PATIENT SURVEYS:  FOTO 53/100 ,  April 08, 2022 has been up to 644and today was 579but has been doing a lot and lifting and is back doing much more than she was.  January 4,2024 57/100 she does report tha tshe wnet backwards from her previous due to her being able to do so much more but is hurting more   COGNITION: Overall cognitive status: Within functional limits for tasks assessed  SENSATION: WFL  POSTURE: rounded shoulders, forward head, and increased thoracic kyphosis  PALPATION: TTP and sore along upper trap and cervical paraspinals, trigger points in R and L UT   CERVICAL ROM:   Active ROM A/PROM (deg) eval 12/30/21 01/05/22 9/28 10/31 04/05/22  Flexion 100% no pain WFL WFL WNL   WNL  Extension [facility] W[facility] W[facility]   WNL  Right lateral flexion Mod limitation w/pain Mild limitations Mild limitations Severe limitation, leans with trunk  Decreased 75%  Decreased 50%  Left lateral flexion Mod limitation w/pain Mod limitations Mild limitations Severe limitation, leans with trunk  Decreased 75% Decreased  50%  Right rotation 75% w/pain Mild limitation w/pain WFL pain at end range  Mild limitation  Decreased 25%  Decreased 25%  Left rotation 75% w/pain  Mild limitation w/pain  WFL pain at end range  Mild limitation  Decreased 25% Decreased  25%   (Blank rows = not tested)  UPPER EXTREMITY ROM: WFL, limited in IR and ER due to prior RC surgery years ago   UPPER EXTREMITY MMT:  MMT Right eval Left eval Right 01/05/22 Left 01/05/22 R 9/28 L 9/28  Shoulder flexion 4+ 4+ '5 5 5 5  '$ Shoulder extension        Shoulder abduction 4+ very painful 4+ 4+ with pain '5 5 5  '$ Shoulder adduction        Shoulder extension        Shoulder internal rotation 4+ 4+ '5 5 5 5  '$ Shoulder external rotation     5 5  Middle trapezius        Lower trapezius        Elbow flexion 5 5      Elbow extension 5 5      Wrist flexion        Wrist extension  Wrist ulnar deviation        Wrist radial deviation        Wrist pronation         Wrist supination        Grip strength         (Blank rows = not tested) 05/05/22 LE MMT  right 4+/5, left 4/5  SLR 40 degrees with pain in the legs and the back  Cannot cross her legs due to tight piriformis 25 degrees hip ER  Abdominal strength 2/5 unable to sit up or hold at 40 degrees  5x STS 21 seconds    TODAY'S TREATMENT:  06/02/22 Nustep level 5 x 6 minutes 15# straight arm pulls 2x10 15# AR press  weighted ball trunk rotation to wall Weighted ball overhead reach Door stretch Leg press 40# 2x10, 60# x 10 5# hip flexion, extension and abduction 2x10 Bird dogs Plans 10s x 3  05/31/22 UBE level 4 x 6 minutes 10# straight arm pulls 15# AR press 25# triceps 10# biceps 2x10 5# hip extension and abduction 5# Hip flexion Seated row 25# Lats 25# Quadraped alternating arms and legs Plank 10 seconds x 2 cues for breathing PROM LE's  05/26/22 UBE LEvel 4 x 6 minutes 15# AR press 10# straight arm pulls Leg curls 25# Leg extension 5# Volleyball Volleyball on the airex Airex side step on and off PAssive stretch to the LE's  05/24/22 UBE level 4 x 6 minutes Leg curls 25# Leg extension 10# Triceps 25# 2x10 Biceps 10# 2x10 10# straight arm pulls 15# AR press LEg press 40# 3x10 Volleyball Passive stretch LE and upper trap, levator  05/19/22 UBE level 4 x 6 minutes 10# straight arm pulls  25# seated rows 25# lats 40# leg press Passive Piriformis stretches STM with the theragun to the rihgt trap, neck and rhomboid  05/17/22 UBE level 4 x 6 minutes 10# straight arm pulls 15# AR 5# hip abduction and extension 25# rows 2x10 25# lats 2x10 10# chest press Leg curls 25# 2x10 Leg extension 10# 2x10 Leg press 50# 2x10 Feet on ball bridge, isometric abs and partial sit ups Passive HS and piriformis stretch    PATIENT EDUCATION:  Education details: POC Person educated: Patient Education method: Explanation Education comprehension: verbalized  understanding   HOME EXERCISE PROGRAM: Access Code: [email or web address] URL: [email or web address] Date: 12/01/2021 Prepared by: Andris Baumann   ASSESSMENT:  CLINICAL IMPRESSION: Patient continues to have higher pain rating of HA she is definitely building strength with her increased ability and I am increasing the the weights She will be out of town next week, will assess and renew when she returns  REHAB POTENTIAL: Good  CLINICAL DECISION MAKING: Stable/uncomplicated  EVALUATION COMPLEXITY: Low   GOALS: Goals reviewed with patient? Yes  SHORT TERM GOALS: Target date: 12/29/21  Patient will be independent with initial HEP.  Goal status: MET  LONG TERM GOALS: Target date: 02/26/22  Patient will be independent with advanced/ongoing HEP to improve outcomes and carryover.  Goal status: IN PROGRESS  2.  Patient will report 75% improvement in neck pain to improve QOL.   Baseline: 9/28- not even 50% improvement maybe 40% Goal status: IN PROGRESS  3.  Patient will demonstrate full pain free cervical ROM to complete ADLs and play with grandchildren with more ease. Goal status: met 4.  Patient will demonstrate improved posture to decrease muscle imbalance and tightness.  Baseline: increased tightness along cervical paraspinals Goal status: progressing still tight piriformis  5.  Patient will report 12 on FOTO to demonstrate improved functional ability.  Baseline:  9/28- 64  Goal status: MET but has regressed as she is doing more and more  6.  Will demonstrate improved core strength as evidenced by passing double leg lower test  Baseline:  Goal status: progressing    PLAN: PT FREQUENCY: 2x/week  PT DURATION: 4 weeks  PLANNED INTERVENTIONS: Therapeutic exercises, Therapeutic activity, Neuromuscular re-education, Balance training, Gait training, Patient/Family education, Self Care, Joint mobilization, Dry Needling, Spinal manipulation, Spinal mobilization, Cryotherapy,  Moist heat, Taping, Vasopneumatic device, Traction, Ionotophoresis '4mg'$ /ml Dexamethasone, and Manual therapy  PLAN FOR NEXT SESSION:  renewal Lum Babe, PT 06/02/2022, 4:16 PM   [facility] [address] [place], [place], [address] Phone: [phone]   Fax:  [phone]

## 2022-06-07 ENCOUNTER — Encounter: Payer: Self-pay | Admitting: Physical Therapy

## 2022-06-09 ENCOUNTER — Encounter: Payer: Self-pay | Admitting: Physical Therapy

## 2022-06-14 ENCOUNTER — Ambulatory Visit: Payer: Medicare Other | Admitting: Physical Therapy

## 2022-06-14 ENCOUNTER — Encounter: Payer: Self-pay | Admitting: Physical Therapy

## 2022-06-14 DIAGNOSIS — H35033 Hypertensive retinopathy, bilateral: Secondary | ICD-10-CM | POA: Diagnosis not present

## 2022-06-14 DIAGNOSIS — M542 Cervicalgia: Secondary | ICD-10-CM

## 2022-06-14 DIAGNOSIS — Z7984 Long term (current) use of oral hypoglycemic drugs: Secondary | ICD-10-CM | POA: Diagnosis not present

## 2022-06-14 DIAGNOSIS — M6281 Muscle weakness (generalized): Secondary | ICD-10-CM

## 2022-06-14 DIAGNOSIS — H35371 Puckering of macula, right eye: Secondary | ICD-10-CM | POA: Diagnosis not present

## 2022-06-14 DIAGNOSIS — H04123 Dry eye syndrome of bilateral lacrimal glands: Secondary | ICD-10-CM | POA: Diagnosis not present

## 2022-06-14 DIAGNOSIS — E119 Type 2 diabetes mellitus without complications: Secondary | ICD-10-CM | POA: Diagnosis not present

## 2022-06-14 DIAGNOSIS — H524 Presbyopia: Secondary | ICD-10-CM | POA: Diagnosis not present

## 2022-06-14 DIAGNOSIS — H52203 Unspecified astigmatism, bilateral: Secondary | ICD-10-CM | POA: Diagnosis not present

## 2022-06-14 DIAGNOSIS — R519 Headache, unspecified: Secondary | ICD-10-CM | POA: Diagnosis not present

## 2022-06-14 DIAGNOSIS — H5201 Hypermetropia, right eye: Secondary | ICD-10-CM | POA: Diagnosis not present

## 2022-06-14 DIAGNOSIS — G8929 Other chronic pain: Secondary | ICD-10-CM | POA: Diagnosis not present

## 2022-06-14 DIAGNOSIS — H26493 Other secondary cataract, bilateral: Secondary | ICD-10-CM | POA: Diagnosis not present

## 2022-06-14 NOTE — Therapy (Signed)
OUTPATIENT PHYSICAL THERAPY CERVICAL TREATMENT    Patient Name: Veronica Galvan MRN: [id number] DOB:1956/03/06, 67 y.o., female Today's Date: 06/14/2022    Past Medical History:  Diagnosis Date   Allergy    Anxiety    Arthritis    hands   Cataract    Diabetes mellitus without complication ([place])    type 2   Family history of adverse reaction to anesthesia    son has malignant hyperthermia, daughter does not daughter recently had c section without problems   GERD (gastroesophageal reflux disease)    Headache    sinus   Hyperlipidemia    Hypertension    PONV (postoperative nausea and vomiting)    nausea only   Ulcer, stomach peptic yrs ago   Past Surgical History:  Procedure Laterality Date   APPENDECTOMY     both hells bone spur repair     both heels with metal clips   both shoulder rotator cuff repair     CESAREAN SECTION     x 1   COLONOSCOPY WITH PROPOFOL N/A 09/07/2016   Procedure: COLONOSCOPY WITH PROPOFOL;  Surgeon: Garlan Fair, MD;  Location: [facility];  Service: Endoscopy;  Laterality: N/A;   colonscopy  06/2011   polyps   EYE SURGERY     FRACTURE SURGERY     TUBAL LIGATION     VESICO-VAGINAL FISTULA REPAIR     Patient Active Problem List   Diagnosis Date Noted   Gastroesophageal reflux disease 11/16/2021   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 08/18/2021   Dermatofibroma 08/18/2021   History of malignant neoplasm of skin 08/18/2021   Lentigo 08/18/2021   Melanocytic nevi of trunk 08/18/2021   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   Rosacea 08/18/2021   Actinic keratosis 08/18/2021   Sensorineural hearing loss (SNHL) of left ear with unrestricted hearing of right ear 07/26/2021   Tinnitus of left ear 07/26/2021   Acute recurrent maxillary sinusitis 06/22/2021   Primary hypertension 01/12/2021   Hyperlipidemia associated with type 2 diabetes mellitus ([place]) 01/12/2021   Arthritis 01/12/2021   Type II  diabetes mellitus ([place]) 11/25/2019   Ingrown toenail 09/05/2018   Neck pain 01/29/2018   Tick bite 10/24/2017    PCP: Jodi Mourning  REFERRING PROVIDER: Genia Harold  REFERRING DIAG: M54.2  THERAPY DIAG:  Cervicalgia  Muscle weakness (generalized)  Chronic intractable headache, unspecified headache type  Rationale for Evaluation and Treatment Rehabilitation  ONSET DATE: 11/08/21  SUBJECTIVE:  SUBJECTIVE STATEMENT: Patient has been away for the past two weeks, she went on a trip and did some activity, she reports that she was very pleased at how she was able to do with the trip, she does report some difficulty lifting a suitcase PERTINENT HISTORY:  Arthritis, HTN, bilateral rotator cuff surgery 15-89yr ago, DM  PAIN:  Are you having pain? Yes: NPRS scale: 08/10 Pain location: head and neck, shoulders  Pain description: tight  Aggravating factors: moving Relieving factors: massage, standing in the shower  WEIGHT BEARING RESTRICTIONS No  FALLS:  Has patient fallen in last 6 months? No  LIVING ENVIRONMENT: Lives with: lives alone Lives in: House/apartment Stairs: Yes: External: 3 steps; can reach both Has following equipment at home: None  OCCUPATION: reitred  PLOF: Independent  PATIENT GOALS to loosen it up, find out what is causing my headaches   OBJECTIVE:   DIAGNOSTIC FINDINGS:   FINDINGS: Brain: No acute infarct, mass effect or extra-axial collection. No acute or chronic hemorrhage. Normal white matter signal, parenchymal volume and CSF spaces. The midline structures are normal.  Skull and upper cervical spine: Normal calvarium and skull base. Visualized upper cervical spine and soft tissues are normal.   Sinuses/Orbits:No paranasal sinus fluid levels or  advanced mucosal thickening. No mastoid or middle ear effusion. Normal orbits.   IMPRESSION: Normal brain MRI.  PATIENT SURVEYS:  FOTO 53/100 ,  April 08, 2022 has been up to 652and today was 556but has been doing a lot and lifting and is back doing much more than she was.  January 4,2024 57/100 she does report tha tshe wnet backwards from her previous due to her being able to do so much more but is hurting more   COGNITION: Overall cognitive status: Within functional limits for tasks assessed  SENSATION: WFL  POSTURE: rounded shoulders, forward head, and increased thoracic kyphosis  PALPATION: TTP and sore along upper trap and cervical paraspinals, trigger points in R and L UT   CERVICAL ROM:   Active ROM A/PROM (deg) eval 12/30/21 01/05/22 9/28 10/31 04/05/22  Flexion 100% no pain WFL WFL WNL   WNL  Extension Mild limitations w/pain [facility] WFL WNL   WNL  Right lateral flexion Mod limitation w/pain Mild limitations Mild limitations Severe limitation, leans with trunk  Decreased 75%  Decreased 50%  Left lateral flexion Mod limitation w/pain Mod limitations Mild limitations Severe limitation, leans with trunk  Decreased 75% Decreased  50%  Right rotation 75% w/pain Mild limitation w/pain WFL pain at end range  Mild limitation  Decreased 25%  Decreased 25%  Left rotation 75% w/pain  Mild limitation w/pain  WFL pain at end range  Mild limitation  Decreased 25% Decreased  25%   (Blank rows = not tested)  UPPER EXTREMITY ROM: WFL, limited in IR and ER due to prior RC surgery years ago   UPPER EXTREMITY MMT:  MMT Right eval Left eval Right 01/05/22 Left 01/05/22 R 9/28 L 9/28  Shoulder flexion 4+ 4+ 5 5 5 5  $ Shoulder extension        Shoulder abduction 4+ very painful 4+ 4+ with pain 5 5 5  $ Shoulder adduction        Shoulder extension        Shoulder internal rotation 4+ 4+ 5 5 5 5  $ Shoulder external rotation     5 5  Middle trapezius        Lower trapezius  Elbow  flexion 5 5      Elbow extension 5 5      Wrist flexion        Wrist extension        Wrist ulnar deviation        Wrist radial deviation        Wrist pronation        Wrist supination        Grip strength         (Blank rows = not tested) 05/05/22 LE MMT  right 4+/5, left 4/5  SLR 40 degrees with pain in the legs and the back  Cannot cross her legs due to tight piriformis 25 degrees hip ER  Abdominal strength 2/5 unable to sit up or hold at 40 degrees  5x STS 21 seconds 06/14/22 TUG 10 seconds  5XSTS 16 seconds  Right 4+/5, left 4+/5  Manually I can cross her legs fully, she reports able to get   Socks on now, she can on her own without hands get her  Heels to mid patella  SLR to 55 degrees  Can hold plank 10 seconds elbows and toes  Had her A1C go down from 8.2 to 7.8     TODAY'S TREATMENT:  06/14/22 Nustep level 5 x 6 minutes Passive stretch of the LE's Plank 10s x3 5 # hip abduction, extension and flexion LEg press 40# 2x10 10# straight arm press  15# AR press 20# lats  06/02/22 Nustep level 5 x 6 minutes 15# straight arm pulls 2x10 15# AR press  weighted ball trunk rotation to wall Weighted ball overhead reach Door stretch Leg press 40# 2x10, 60# x 10 5# hip flexion, extension and abduction 2x10 Bird dogs Plans 10s x 3  05/31/22 UBE level 4 x 6 minutes 10# straight arm pulls 15# AR press 25# triceps 10# biceps 2x10 5# hip extension and abduction 5# Hip flexion Seated row 25# Lats 25# Quadraped alternating arms and legs Plank 10 seconds x 2 cues for breathing PROM LE's  05/26/22 UBE LEvel 4 x 6 minutes 15# AR press 10# straight arm pulls Leg curls 25# Leg extension 5# Volleyball Volleyball on the airex Airex side step on and off PAssive stretch to the LE's  05/24/22 UBE level 4 x 6 minutes Leg curls 25# Leg extension 10# Triceps 25# 2x10 Biceps 10# 2x10 10# straight arm pulls 15# AR press LEg press 40# 3x10 Volleyball Passive stretch LE  and upper trap, levator  05/19/22   PATIENT EDUCATION:  Education details: POC Person educated: Patient Education method: Explanation Education comprehension: verbalized understanding   HOME EXERCISE PROGRAM: Access Code: [email or web address] URL: [email or web address] Date: 12/01/2021 Prepared by: Andris Baumann   ASSESSMENT:  CLINICAL IMPRESSION: Patient continues to improve and is making gains in ROM, strength and overall function as noted in the assessment above.  She reports that she has been able to do more with less pain, reports that the HA intensity is less, still having daily HA's but more tolerable, she can almost cross her legs to put on shoes and socks  REHAB POTENTIAL: Good  CLINICAL DECISION MAKING: Stable/uncomplicated  EVALUATION COMPLEXITY: Low   GOALS: Goals reviewed with patient? Yes  SHORT TERM GOALS: Target date: 12/29/21  Patient will be independent with initial HEP.  Goal status: MET  LONG TERM GOALS: Target date: 02/26/22  Patient will be independent with advanced/ongoing HEP to improve outcomes and carryover.  Goal status: IN PROGRESS  2.  Patient will report  75% improvement in neck pain to improve QOL.   Baseline: 9/28- not even 50% improvement maybe 40% Goal status: IN PROGRESS  3.  Patient will demonstrate full pain free cervical ROM to complete ADLs and play with grandchildren with more ease. Goal status: met 4.  Patient will demonstrate improved posture to decrease muscle imbalance and tightness.  Baseline: increased tightness along cervical paraspinals Goal status: progressing still tight piriformis  5.  Patient will report 32 on FOTO to demonstrate improved functional ability.  Baseline:  9/28- 64  Goal status: MET but has regressed as she is doing more and more  6.  Will demonstrate improved core strength as evidenced by passing double leg lower test  Baseline:  Goal status: progressing    PLAN: PT FREQUENCY: 2x/week  PT  DURATION: 4 weeks  PLANNED INTERVENTIONS: Therapeutic exercises, Therapeutic activity, Neuromuscular re-education, Balance training, Gait training, Patient/Family education, Self Care, Joint mobilization, Dry Needling, Spinal manipulation, Spinal mobilization, Cryotherapy, Moist heat, Taping, Vasopneumatic device, Traction, Ionotophoresis 52m/ml Dexamethasone, and Manual therapy  PLAN FOR NEXT SESSION:  renewal done and sent to MD MLum Babe PT 06/14/2022, 3:30 PM   [place] [address]Phone: [phone]  Fax:  [phone]

## 2022-06-15 ENCOUNTER — Encounter: Payer: Self-pay | Admitting: Physical Therapy

## 2022-06-15 ENCOUNTER — Ambulatory Visit: Payer: Medicare Other | Admitting: Physical Therapy

## 2022-06-15 DIAGNOSIS — M6281 Muscle weakness (generalized): Secondary | ICD-10-CM

## 2022-06-15 DIAGNOSIS — R519 Headache, unspecified: Secondary | ICD-10-CM

## 2022-06-15 DIAGNOSIS — M542 Cervicalgia: Secondary | ICD-10-CM

## 2022-06-15 DIAGNOSIS — G8929 Other chronic pain: Secondary | ICD-10-CM | POA: Diagnosis not present

## 2022-06-15 NOTE — Therapy (Signed)
OUTPATIENT PHYSICAL THERAPY CERVICAL TREATMENT    Patient Name: Veronica Galvan MRN: [id number] DOB:1955/10/07, 67 y.o., female Today's Date: 06/15/2022    Past Medical History:  Diagnosis Date   Allergy    Anxiety    Arthritis    hands   Cataract    Diabetes mellitus without complication ([place])    type 2   Family history of adverse reaction to anesthesia    son has malignant hyperthermia, daughter does not daughter recently had c section without problems   GERD (gastroesophageal reflux disease)    Headache    sinus   Hyperlipidemia    Hypertension    PONV (postoperative nausea and vomiting)    nausea only   Ulcer, stomach peptic yrs ago   Past Surgical History:  Procedure Laterality Date   APPENDECTOMY     both hells bone spur repair     both heels with metal clips   both shoulder rotator cuff repair     CESAREAN SECTION     x 1   COLONOSCOPY WITH PROPOFOL N/A 09/07/2016   Procedure: COLONOSCOPY WITH PROPOFOL;  Surgeon: Garlan Fair, MD;  Location: [facility];  Service: Endoscopy;  Laterality: N/A;   colonscopy  06/2011   polyps   EYE SURGERY     FRACTURE SURGERY     TUBAL LIGATION     VESICO-VAGINAL FISTULA REPAIR     Patient Active Problem List   Diagnosis Date Noted   Gastroesophageal reflux disease 11/16/2021   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 08/18/2021   Dermatofibroma 08/18/2021   History of malignant neoplasm of skin 08/18/2021   Lentigo 08/18/2021   Melanocytic nevi of trunk 08/18/2021   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   Rosacea 08/18/2021   Actinic keratosis 08/18/2021   Sensorineural hearing loss (SNHL) of left ear with unrestricted hearing of right ear 07/26/2021   Tinnitus of left ear 07/26/2021   Acute recurrent maxillary sinusitis 06/22/2021   Primary hypertension 01/12/2021   Hyperlipidemia associated with type 2 diabetes mellitus ([place]) 01/12/2021   Arthritis 01/12/2021   Type II  diabetes mellitus ([place]) 11/25/2019   Ingrown toenail 09/05/2018   Neck pain 01/29/2018   Tick bite 10/24/2017    PCP: Jodi Mourning  REFERRING PROVIDER: Genia Harold  REFERRING DIAG: M54.2  THERAPY DIAG:  Cervicalgia  Muscle weakness (generalized)  Chronic intractable headache, unspecified headache type  Rationale for Evaluation and Treatment Rehabilitation  ONSET DATE: 11/08/21  SUBJECTIVE:  SUBJECTIVE STATEMENT: Reports a tough day, hurting in the neck, shoulders and back, reports did not sleep well PERTINENT HISTORY:  Arthritis, HTN, bilateral rotator cuff surgery 15-60yr ago, DM  PAIN:  Are you having pain? Yes: NPRS scale: 08/10 Pain location: head and neck, shoulders  Pain description: tight  Aggravating factors: moving Relieving factors: massage, standing in the shower  WEIGHT BEARING RESTRICTIONS No  FALLS:  Has patient fallen in last 6 months? No  LIVING ENVIRONMENT: Lives with: lives alone Lives in: House/apartment Stairs: Yes: External: 3 steps; can reach both Has following equipment at home: None  OCCUPATION: reitred  PLOF: Independent  PATIENT GOALS to loosen it up, find out what is causing my headaches   OBJECTIVE:   DIAGNOSTIC FINDINGS:   FINDINGS: Brain: No acute infarct, mass effect or extra-axial collection. No acute or chronic hemorrhage. Normal white matter signal, parenchymal volume and CSF spaces. The midline structures are normal.  Skull and upper cervical spine: Normal calvarium and skull base. Visualized upper cervical spine and soft tissues are normal.   Sinuses/Orbits:No paranasal sinus fluid levels or advanced mucosal thickening. No mastoid or middle ear effusion. Normal orbits.   IMPRESSION: Normal brain MRI.  PATIENT  SURVEYS:  FOTO 53/100 ,  April 08, 2022 has been up to 655and today was 544but has been doing a lot and lifting and is back doing much more than she was.  January 4,2024 57/100 she does report tha tshe wnet backwards from her previous due to her being able to do so much more but is hurting more   COGNITION: Overall cognitive status: Within functional limits for tasks assessed  SENSATION: WFL  POSTURE: rounded shoulders, forward head, and increased thoracic kyphosis  PALPATION: TTP and sore along upper trap and cervical paraspinals, trigger points in R and L UT   CERVICAL ROM:   Active ROM A/PROM (deg) eval 12/30/21 01/05/22 9/28 10/31 04/05/22  Flexion 100% no pain WFL WFL WNL   WNL  Extension [facility] W[facility]   WNL  Right lateral flexion Mod limitation w/pain Mild limitations Mild limitations Severe limitation, leans with trunk  Decreased 75%  Decreased 50%  Left lateral flexion Mod limitation w/pain Mod limitations Mild limitations Severe limitation, leans with trunk  Decreased 75% Decreased  50%  Right rotation 75% w/pain Mild limitation w/pain WFL pain at end range  Mild limitation  Decreased 25%  Decreased 25%  Left rotation 75% w/pain  Mild limitation w/pain  WFL pain at end range  Mild limitation  Decreased 25% Decreased  25%   (Blank rows = not tested)  UPPER EXTREMITY ROM: WFL, limited in IR and ER due to prior RC surgery years ago   UPPER EXTREMITY MMT:  MMT Right eval Left eval Right 01/05/22 Left 01/05/22 R 9/28 L 9/28  Shoulder flexion 4+ 4+ 5 5 5 5  $ Shoulder extension        Shoulder abduction 4+ very painful 4+ 4+ with pain 5 5 5  $ Shoulder adduction        Shoulder extension        Shoulder internal rotation 4+ 4+ 5 5 5 5  $ Shoulder external rotation     5 5  Middle trapezius        Lower trapezius        Elbow flexion 5 5      Elbow extension 5 5      Wrist flexion  Wrist extension        Wrist ulnar deviation        Wrist radial  deviation        Wrist pronation        Wrist supination        Grip strength         (Blank rows = not tested) 05/05/22 LE MMT  right 4+/5, left 4/5  SLR 40 degrees with pain in the legs and the back  Cannot cross her legs due to tight piriformis 25 degrees hip ER  Abdominal strength 2/5 unable to sit up or hold at 40 degrees  5x STS 21 seconds 06/14/22 TUG 10 seconds  5XSTS 16 seconds  Right 4+/5, left 4+/5  Manually I can cross her legs fully, she reports able to get   Socks on now, she can on her own without hands get her  Heels to mid patella  SLR to 55 degrees  Can hold plank 10 seconds elbows and toes  Had her A1C go down from 8.2 to 7.8     TODAY'S TREATMENT:  06/15/22 UBE level 4 x 6 minutes Nustep level 5 x 5 minutes Gait around the back building trying to keep a brisk pace 40# resisted gait all directions 10# straight arm pulls 15# AR press Leg press 50# Doorway stretch Passive LE stretches Bird dogs  06/14/22 Nustep level 5 x 6 minutes Passive stretch of the LE's Plank 10s x3 5 # hip abduction, extension and flexion LEg press 40# 2x10 10# straight arm press  15# AR press 20# lats  06/02/22 Nustep level 5 x 6 minutes 15# straight arm pulls 2x10 15# AR press  weighted ball trunk rotation to wall Weighted ball overhead reach Door stretch Leg press 40# 2x10, 60# x 10 5# hip flexion, extension and abduction 2x10 Bird dogs Plans 10s x 3  05/31/22 UBE level 4 x 6 minutes 10# straight arm pulls 15# AR press 25# triceps 10# biceps 2x10 5# hip extension and abduction 5# Hip flexion Seated row 25# Lats 25# Quadraped alternating arms and legs Plank 10 seconds x 2 cues for breathing PROM LE's  05/26/22 UBE LEvel 4 x 6 minutes 15# AR press 10# straight arm pulls Leg curls 25# Leg extension 5# Volleyball Volleyball on the airex Airex side step on and off PAssive stretch to the LE's  05/24/22 UBE level 4 x 6 minutes Leg curls 25# Leg extension  10# Triceps 25# 2x10 Biceps 10# 2x10 10# straight arm pulls 15# AR press LEg press 40# 3x10 Volleyball Passive stretch LE and upper trap, levator  05/19/22   PATIENT EDUCATION:  Education details: POC Person educated: Patient Education method: Explanation Education comprehension: verbalized understanding   HOME EXERCISE PROGRAM: Access Code: [email or web address] URL: [email or web address] Date: 12/01/2021 Prepared by: Andris Baumann   ASSESSMENT:  CLINICAL IMPRESSION: Patient reports soreness and fatigue in the hips and the legs, she reports that she went to a show last night and shopped today.  Really has a hard time relaxing and allowing stretch, tends to fight the stretch  REHAB POTENTIAL: Good  CLINICAL DECISION MAKING: Stable/uncomplicated  EVALUATION COMPLEXITY: Low   GOALS: Goals reviewed with patient? Yes  SHORT TERM GOALS: Target date: 12/29/21  Patient will be independent with initial HEP.  Goal status: MET  LONG TERM GOALS: Target date: 02/26/22  Patient will be independent with advanced/ongoing HEP to improve outcomes and carryover.  Goal status: IN PROGRESS  2.  Patient will report 75%  improvement in neck pain to improve QOL.   Baseline: 9/28- not even 50% improvement maybe 40% Goal status: IN PROGRESS  3.  Patient will demonstrate full pain free cervical ROM to complete ADLs and play with grandchildren with more ease. Goal status: met 4.  Patient will demonstrate improved posture to decrease muscle imbalance and tightness.  Baseline: increased tightness along cervical paraspinals Goal status: progressing still tight piriformis  5.  Patient will report 1 on FOTO to demonstrate improved functional ability.  Baseline:  9/28- 64  Goal status: MET but has regressed as she is doing more and more  6.  Will demonstrate improved core strength as evidenced by passing double leg lower test  Baseline:  Goal status: progressing    PLAN: PT  FREQUENCY: 2x/week  PT DURATION: 4 weeks  PLANNED INTERVENTIONS: Therapeutic exercises, Therapeutic activity, Neuromuscular re-education, Balance training, Gait training, Patient/Family education, Self Care, Joint mobilization, Dry Needling, Spinal manipulation, Spinal mobilization, Cryotherapy, Moist heat, Taping, Vasopneumatic device, Traction, Ionotophoresis 54m/ml Dexamethasone, and Manual therapy  PLAN FOR NEXT SESSION:  renewal done and sent to MD MLum Babe PT 06/15/2022, 4:15 PM   [place] [address]Phone: [phone]  Fax:  [phone]

## 2022-06-21 ENCOUNTER — Encounter: Payer: Self-pay | Admitting: Physical Therapy

## 2022-06-21 ENCOUNTER — Ambulatory Visit: Payer: Medicare Other | Admitting: Physical Therapy

## 2022-06-21 DIAGNOSIS — M6281 Muscle weakness (generalized): Secondary | ICD-10-CM | POA: Diagnosis not present

## 2022-06-21 DIAGNOSIS — M542 Cervicalgia: Secondary | ICD-10-CM

## 2022-06-21 DIAGNOSIS — R519 Headache, unspecified: Secondary | ICD-10-CM

## 2022-06-21 DIAGNOSIS — G8929 Other chronic pain: Secondary | ICD-10-CM | POA: Diagnosis not present

## 2022-06-21 NOTE — Therapy (Signed)
OUTPATIENT PHYSICAL THERAPY CERVICAL TREATMENT    Patient Name: Veronica Galvan MRN: [id number] DOB:1955/08/07, 67 y.o., female Today's Date: 06/21/2022    Past Medical History:  Diagnosis Date   Allergy    Anxiety    Arthritis    hands   Cataract    Diabetes mellitus without complication ([place])    type 2   Family history of adverse reaction to anesthesia    son has malignant hyperthermia, daughter does not daughter recently had c section without problems   GERD (gastroesophageal reflux disease)    Headache    sinus   Hyperlipidemia    Hypertension    PONV (postoperative nausea and vomiting)    nausea only   Ulcer, stomach peptic yrs ago   Past Surgical History:  Procedure Laterality Date   APPENDECTOMY     both hells bone spur repair     both heels with metal clips   both shoulder rotator cuff repair     CESAREAN SECTION     x 1   COLONOSCOPY WITH PROPOFOL N/A 09/07/2016   Procedure: COLONOSCOPY WITH PROPOFOL;  Surgeon: Garlan Fair, MD;  Location: [facility];  Service: Endoscopy;  Laterality: N/A;   colonscopy  06/2011   polyps   EYE SURGERY     FRACTURE SURGERY     TUBAL LIGATION     VESICO-VAGINAL FISTULA REPAIR     Patient Active Problem List   Diagnosis Date Noted   Gastroesophageal reflux disease 11/16/2021   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 08/18/2021   Dermatofibroma 08/18/2021   History of malignant neoplasm of skin 08/18/2021   Lentigo 08/18/2021   Melanocytic nevi of trunk 08/18/2021   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   Rosacea 08/18/2021   Actinic keratosis 08/18/2021   Sensorineural hearing loss (SNHL) of left ear with unrestricted hearing of right ear 07/26/2021   Tinnitus of left ear 07/26/2021   Acute recurrent maxillary sinusitis 06/22/2021   Primary hypertension 01/12/2021   Hyperlipidemia associated with type 2 diabetes mellitus ([place]) 01/12/2021   Arthritis 01/12/2021   Type II  diabetes mellitus ([place]) 11/25/2019   Ingrown toenail 09/05/2018   Neck pain 01/29/2018   Tick bite 10/24/2017    PCP: Jodi Mourning  REFERRING PROVIDER: Genia Harold  REFERRING DIAG: M54.2  THERAPY DIAG:  Cervicalgia  Muscle weakness (generalized)  Chronic intractable headache, unspecified headache type  Rationale for Evaluation and Treatment Rehabilitation  ONSET DATE: 11/08/21  SUBJECTIVE:  SUBJECTIVE STATEMENT: Patient reports that she is doing okay she did get a fitbit, but has not set it up yet, she is having some arthritic pain PERTINENT HISTORY:  Arthritis, HTN, bilateral rotator cuff surgery 15-65yr ago, DM  PAIN:  Are you having pain? Yes: NPRS scale: 08/10 Pain location: head and neck, shoulders  Pain description: tight  Aggravating factors: moving Relieving factors: massage, standing in the shower  WEIGHT BEARING RESTRICTIONS No  FALLS:  Has patient fallen in last 6 months? No  LIVING ENVIRONMENT: Lives with: lives alone Lives in: House/apartment Stairs: Yes: External: 3 steps; can reach both Has following equipment at home: None  OCCUPATION: reitred  PLOF: Independent  PATIENT GOALS to loosen it up, find out what is causing my headaches   OBJECTIVE:   DIAGNOSTIC FINDINGS:   FINDINGS: Brain: No acute infarct, mass effect or extra-axial collection. No acute or chronic hemorrhage. Normal white matter signal, parenchymal volume and CSF spaces. The midline structures are normal.  Skull and upper cervical spine: Normal calvarium and skull base. Visualized upper cervical spine and soft tissues are normal.   Sinuses/Orbits:No paranasal sinus fluid levels or advanced mucosal thickening. No mastoid or middle ear effusion. Normal orbits.    IMPRESSION: Normal brain MRI.  PATIENT SURVEYS:  FOTO 53/100 ,  April 08, 2022 has been up to 625and today was 537but has been doing a lot and lifting and is back doing much more than she was.  January 4,2024 57/100 she does report tha tshe wnet backwards from her previous due to her being able to do so much more but is hurting more   COGNITION: Overall cognitive status: Within functional limits for tasks assessed  SENSATION: WFL  POSTURE: rounded shoulders, forward head, and increased thoracic kyphosis  PALPATION: TTP and sore along upper trap and cervical paraspinals, trigger points in R and L UT   CERVICAL ROM:   Active ROM A/PROM (deg) eval 12/30/21 01/05/22 9/28 10/31 04/05/22  Flexion 100% no pain WFL WFL WNL   WNL  Extension Mild limitations w/pain [facility] W[facility] WNL   WNL  Right lateral flexion Mod limitation w/pain Mild limitations Mild limitations Severe limitation, leans with trunk  Decreased 75%  Decreased 50%  Left lateral flexion Mod limitation w/pain Mod limitations Mild limitations Severe limitation, leans with trunk  Decreased 75% Decreased  50%  Right rotation 75% w/pain Mild limitation w/pain WFL pain at end range  Mild limitation  Decreased 25%  Decreased 25%  Left rotation 75% w/pain  Mild limitation w/pain  WFL pain at end range  Mild limitation  Decreased 25% Decreased  25%   (Blank rows = not tested)  UPPER EXTREMITY ROM: WFL, limited in IR and ER due to prior RC surgery years ago   UPPER EXTREMITY MMT:  MMT Right eval Left eval Right 01/05/22 Left 01/05/22 R 9/28 L 9/28  Shoulder flexion 4+ 4+ 5 5 5 5  $ Shoulder extension        Shoulder abduction 4+ very painful 4+ 4+ with pain 5 5 5  $ Shoulder adduction        Shoulder extension        Shoulder internal rotation 4+ 4+ 5 5 5 5  $ Shoulder external rotation     5 5  Middle trapezius        Lower trapezius        Elbow flexion 5 5      Elbow extension 5 5  Wrist flexion        Wrist extension         Wrist ulnar deviation        Wrist radial deviation        Wrist pronation        Wrist supination        Grip strength         (Blank rows = not tested) 05/05/22 LE MMT  right 4+/5, left 4/5  SLR 40 degrees with pain in the legs and the back  Cannot cross her legs due to tight piriformis 25 degrees hip ER  Abdominal strength 2/5 unable to sit up or hold at 40 degrees  5x STS 21 seconds 06/14/22 TUG 10 seconds  5XSTS 16 seconds  Right 4+/5, left 4+/5  Manually I can cross her legs fully, she reports able to get   Socks on now, she can on her own without hands get her  Heels to mid patella  SLR to 55 degrees  Can hold plank 10 seconds elbows and toes  Had her A1C go down from 8.2 to 7.8     TODAY'S TREATMENT:  06/21/22 UBE level 4 x 6 minutes Walk outside around the back building at a good pace Leg curls 25# Leg extension 10# Nustep level 5 x 5 minutes working on keeping her SPM > 80, she did very well but her HR was up to 133 bpm, after 4 minutes 111 bpm Tmill push 20s x 3 AR press 15# 10# straight arm pulls Bird dogs Planks 60# 3x10 leg press  06/15/22 UBE level 4 x 6 minutes Nustep level 5 x 5 minutes Gait around the back building trying to keep a brisk pace 40# resisted gait all directions 10# straight arm pulls 15# AR press Leg press 50# Doorway stretch Passive LE stretches Bird dogs  06/14/22 Nustep level 5 x 6 minutes Passive stretch of the LE's Plank 10s x3 5 # hip abduction, extension and flexion LEg press 40# 2x10 10# straight arm press  15# AR press 20# lats  06/02/22 Nustep level 5 x 6 minutes 15# straight arm pulls 2x10 15# AR press  weighted ball trunk rotation to wall Weighted ball overhead reach Door stretch Leg press 40# 2x10, 60# x 10 5# hip flexion, extension and abduction 2x10 Bird dogs Plans 10s x 3  05/31/22 UBE level 4 x 6 minutes 10# straight arm pulls 15# AR press 25# triceps 10# biceps 2x10 5# hip extension and  abduction 5# Hip flexion Seated row 25# Lats 25# Quadraped alternating arms and legs Plank 10 seconds x 2 cues for breathing PROM LE's  05/26/22 UBE LEvel 4 x 6 minutes 15# AR press 10# straight arm pulls Leg curls 25# Leg extension 5# Volleyball Volleyball on the airex Airex side step on and off PAssive stretch to the LE's  05/24/22 UBE level 4 x 6 minutes Leg curls 25# Leg extension 10# Triceps 25# 2x10 Biceps 10# 2x10 10# straight arm pulls 15# AR press LEg press 40# 3x10 Volleyball Passive stretch LE and upper trap, levator  05/19/22   PATIENT EDUCATION:  Education details: POC Person educated: Patient Education method: Explanation Education comprehension: verbalized understanding   HOME EXERCISE PROGRAM: Access Code: [email or web address] URL: [email or web address] Date: 12/01/2021 Prepared by: Andris Baumann   ASSESSMENT:  CLINICAL IMPRESSION: I really turned up the intensity of her workout to try to keep her moving and working at a higher level, she turned red, but with some deep breathing  her [ulse rate would calm down but she did have some difficulty not talking and allowing it to really come back down.  She worked really hard c/o the same HA and no worse, some cues at times to relax her shoulders REHAB POTENTIAL: Good  CLINICAL DECISION MAKING: Stable/uncomplicated  EVALUATION COMPLEXITY: Low   GOALS: Goals reviewed with patient? Yes  SHORT TERM GOALS: Target date: 12/29/21  Patient will be independent with initial HEP.  Goal status: MET  LONG TERM GOALS: Target date: 02/26/22  Patient will be independent with advanced/ongoing HEP to improve outcomes and carryover.  Goal status: IN PROGRESS  2.  Patient will report 75% improvement in neck pain to improve QOL.   Baseline: 9/28- not even 50% improvement maybe 40% Goal status: IN PROGRESS  3.  Patient will demonstrate full pain free cervical ROM to complete ADLs and play with grandchildren  with more ease. Goal status: met 4.  Patient will demonstrate improved posture to decrease muscle imbalance and tightness.  Baseline: increased tightness along cervical paraspinals Goal status: progressing still tight piriformis  5.  Patient will report 20 on FOTO to demonstrate improved functional ability.  Baseline:  9/28- 64  Goal status: MET but has regressed as she is doing more and more  6.  Will demonstrate improved core strength as evidenced by passing double leg lower test  Baseline:  Goal status: progressing    PLAN: PT FREQUENCY: 2x/week  PT DURATION: 4 weeks  PLANNED INTERVENTIONS: Therapeutic exercises, Therapeutic activity, Neuromuscular re-education, Balance training, Gait training, Patient/Family education, Self Care, Joint mobilization, Dry Needling, Spinal manipulation, Spinal mobilization, Cryotherapy, Moist heat, Taping, Vasopneumatic device, Traction, Ionotophoresis 23m/ml Dexamethasone, and Manual therapy  PLAN FOR NEXT SESSION:  renewal done and sent to MD MLum Babe PT 06/21/2022, 6:00 PM   [place] [address]Phone: [phone]  Fax:  [phone]

## 2022-06-23 ENCOUNTER — Ambulatory Visit: Payer: Medicare Other | Admitting: Physical Therapy

## 2022-06-23 ENCOUNTER — Encounter: Payer: Self-pay | Admitting: Physical Therapy

## 2022-06-23 DIAGNOSIS — R519 Headache, unspecified: Secondary | ICD-10-CM | POA: Diagnosis not present

## 2022-06-23 DIAGNOSIS — M6281 Muscle weakness (generalized): Secondary | ICD-10-CM | POA: Diagnosis not present

## 2022-06-23 DIAGNOSIS — M542 Cervicalgia: Secondary | ICD-10-CM

## 2022-06-23 DIAGNOSIS — G8929 Other chronic pain: Secondary | ICD-10-CM | POA: Diagnosis not present

## 2022-06-23 NOTE — Therapy (Signed)
OUTPATIENT PHYSICAL THERAPY CERVICAL TREATMENT    Patient Name: Veronica Galvan MRN: [id number] DOB:[birth year], 67 y.o., female Today's Date: 06/23/2022    Past Medical History:  Diagnosis Date   Allergy    Anxiety    Arthritis    hands   Cataract    Diabetes mellitus without complication ([place])    type 2   Family history of adverse reaction to anesthesia    son has malignant hyperthermia, daughter does not daughter recently had c section without problems   GERD (gastroesophageal reflux disease)    Headache    sinus   Hyperlipidemia    Hypertension    PONV (postoperative nausea and vomiting)    nausea only   Ulcer, stomach peptic yrs ago   Past Surgical History:  Procedure Laterality Date   APPENDECTOMY     both hells bone spur repair     both heels with metal clips   both shoulder rotator cuff repair     CESAREAN SECTION     x 1   COLONOSCOPY WITH PROPOFOL N/A 09/07/2016   Procedure: COLONOSCOPY WITH PROPOFOL;  Surgeon: Garlan Fair, MD;  Location: [facility];  Service: Endoscopy;  Laterality: N/A;   colonscopy  06/2011   polyps   EYE SURGERY     FRACTURE SURGERY     TUBAL LIGATION     VESICO-VAGINAL FISTULA REPAIR     Patient Active Problem List   Diagnosis Date Noted   Gastroesophageal reflux disease 11/16/2021   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 08/18/2021   Dermatofibroma 08/18/2021   History of malignant neoplasm of skin 08/18/2021   Lentigo 08/18/2021   Melanocytic nevi of trunk 08/18/2021   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   Rosacea 08/18/2021   Actinic keratosis 08/18/2021   Sensorineural hearing loss (SNHL) of left ear with unrestricted hearing of right ear 07/26/2021   Tinnitus of left ear 07/26/2021   Acute recurrent maxillary sinusitis 06/22/2021   Primary hypertension 01/12/2021   Hyperlipidemia associated with type 2 diabetes mellitus ([place]) 01/12/2021   Arthritis 01/12/2021   Type II  diabetes mellitus ([place]) 11/25/2019   Ingrown toenail 09/05/2018   Neck pain 01/29/2018   Tick bite 10/24/2017    PCP: Jodi Mourning  REFERRING PROVIDER: Genia Harold  REFERRING DIAG: M54.2  THERAPY DIAG:  Cervicalgia  Muscle weakness (generalized)  Chronic intractable headache, unspecified headache type  Rationale for Evaluation and Treatment Rehabilitation  ONSET DATE: 11/08/21  SUBJECTIVE:  SUBJECTIVE STATEMENT: Patient reports that her legs were sore and she was a little tired, she got a fitbit and wanted some help with it and what to look for for her health PERTINENT HISTORY:  Arthritis, HTN, bilateral rotator cuff surgery 15-57yr ago, DM  PAIN:  Are you having pain? Yes: NPRS scale: 08/10 Pain location: head and neck, shoulders  Pain description: tight  Aggravating factors: moving Relieving factors: massage, standing in the shower  WEIGHT BEARING RESTRICTIONS No  FALLS:  Has patient fallen in last 6 months? No  LIVING ENVIRONMENT: Lives with: lives alone Lives in: House/apartment Stairs: Yes: External: 3 steps; can reach both Has following equipment at home: None  OCCUPATION: reitred  PLOF: Independent  PATIENT GOALS to loosen it up, find out what is causing my headaches   OBJECTIVE:   DIAGNOSTIC FINDINGS:   FINDINGS: Brain: No acute infarct, mass effect or extra-axial collection. No acute or chronic hemorrhage. Normal white matter signal, parenchymal volume and CSF spaces. The midline structures are normal.  Skull and upper cervical spine: Normal calvarium and skull base. Visualized upper cervical spine and soft tissues are normal.   Sinuses/Orbits:No paranasal sinus fluid levels or advanced mucosal thickening. No mastoid or middle ear effusion.  Normal orbits.   IMPRESSION: Normal brain MRI.  PATIENT SURVEYS:  FOTO 53/100 ,  April 08, 2022 has been up to 659and today was 52but has been doing a lot and lifting and is back doing much more than she was.  January 4,2024 57/100 she does report tha tshe wnet backwards from her previous due to her being able to do so much more but is hurting more   COGNITION: Overall cognitive status: Within functional limits for tasks assessed  SENSATION: WFL  POSTURE: rounded shoulders, forward head, and increased thoracic kyphosis  PALPATION: TTP and sore along upper trap and cervical paraspinals, trigger points in R and L UT   CERVICAL ROM:   Active ROM A/PROM (deg) eval 12/30/21 01/05/22 9/28 10/31 04/05/22  Flexion 100% no pain WFL WFL WNL   WNL  Extension Mild limitations w/pain [facility] WFL WNL   WNL  Right lateral flexion Mod limitation w/pain Mild limitations Mild limitations Severe limitation, leans with trunk  Decreased 75%  Decreased 50%  Left lateral flexion Mod limitation w/pain Mod limitations Mild limitations Severe limitation, leans with trunk  Decreased 75% Decreased  50%  Right rotation 75% w/pain Mild limitation w/pain WFL pain at end range  Mild limitation  Decreased 25%  Decreased 25%  Left rotation 75% w/pain  Mild limitation w/pain  WFL pain at end range  Mild limitation  Decreased 25% Decreased  25%   (Blank rows = not tested)  UPPER EXTREMITY ROM: WFL, limited in IR and ER due to prior RC surgery years ago   UPPER EXTREMITY MMT:  MMT Right eval Left eval Right 01/05/22 Left 01/05/22 R 9/28 L 9/28  Shoulder flexion 4+ 4+ 5 5 5 5  $ Shoulder extension        Shoulder abduction 4+ very painful 4+ 4+ with pain 5 5 5  $ Shoulder adduction        Shoulder extension        Shoulder internal rotation 4+ 4+ 5 5 5 5  $ Shoulder external rotation     5 5  Middle trapezius        Lower trapezius        Elbow flexion 5 5      Elbow extension  5 5      Wrist flexion        Wrist  extension        Wrist ulnar deviation        Wrist radial deviation        Wrist pronation        Wrist supination        Grip strength         (Blank rows = not tested) 05/05/22 LE MMT  right 4+/5, left 4/5  SLR 40 degrees with pain in the legs and the back  Cannot cross her legs due to tight piriformis 25 degrees hip ER  Abdominal strength 2/5 unable to sit up or hold at 40 degrees  5x STS 21 seconds 06/14/22 TUG 10 seconds  5XSTS 16 seconds  Right 4+/5, left 4+/5  Manually I can cross her legs fully, she reports able to get   Socks on now, she can on her own without hands get her  Heels to mid patella  SLR to 55 degrees  Can hold plank 10 seconds elbows and toes  Had her A1C go down from 8.2 to 7.8     TODAY'S TREATMENT:  06/23/22 Nustep Level 5 x 5 minutes Walk around the building in back and a brisk pace 15# straight arm extension 15#AR press Tmill push 20 seconds x 4 Bike x 6 minutes 3 power bursts Worked with her on set up of her fit bit and what to look at and what is important to monitor Plank 10s x 3  06/21/22 UBE level 4 x 6 minutes Walk outside around the back building at a good pace Leg curls 25# Leg extension 10# Nustep level 5 x 5 minutes working on keeping her SPM > 80, she did very well but her HR was up to 133 bpm, after 4 minutes 111 bpm Tmill push 20s x 3 AR press 15# 10# straight arm pulls Bird dogs Planks 60# 3x10 leg press  06/15/22 UBE level 4 x 6 minutes Nustep level 5 x 5 minutes Gait around the back building trying to keep a brisk pace 40# resisted gait all directions 10# straight arm pulls 15# AR press Leg press 50# Doorway stretch Passive LE stretches Bird dogs  06/14/22 Nustep level 5 x 6 minutes Passive stretch of the LE's Plank 10s x3 5 # hip abduction, extension and flexion LEg press 40# 2x10 10# straight arm press  15# AR press 20# lats  06/02/22 Nustep level 5 x 6 minutes 15# straight arm pulls 2x10 15# AR press   weighted ball trunk rotation to wall Weighted ball overhead reach Door stretch Leg press 40# 2x10, 60# x 10 5# hip flexion, extension and abduction 2x10 Bird dogs Plans 10s x 3  05/31/22 UBE level 4 x 6 minutes 10# straight arm pulls 15# AR press 25# triceps 10# biceps 2x10 5# hip extension and abduction 5# Hip flexion Seated row 25# Lats 25# Quadraped alternating arms and legs Plank 10 seconds x 2 cues for breathing PROM LE's  05/26/22 UBE LEvel 4 x 6 minutes 15# AR press 10# straight arm pulls Leg curls 25# Leg extension 5# Volleyball Volleyball on the airex Airex side step on and off PAssive stretch to the LE's  05/24/22 UBE level 4 x 6 minutes Leg curls 25# Leg extension 10# Triceps 25# 2x10 Biceps 10# 2x10 10# straight arm pulls 15# AR press LEg press 40# 3x10 Volleyball Passive stretch LE and upper trap, levator  05/19/22  PATIENT EDUCATION:  Education details: POC Person educated: Patient Education method: Explanation Education comprehension: verbalized understanding   HOME EXERCISE PROGRAM: Access Code: [email or web address] URL: [email or web address] Date: 12/01/2021 Prepared by: Andris Baumann   ASSESSMENT:  CLINICAL IMPRESSION: She was sore after the last visit where we really turned up the intensity of the workout, I added some power burst on her bike today to push her for cardio.  We also started some health education for her fitness and overall health with her new fitbit that insurance got her REHAB POTENTIAL: Good  CLINICAL DECISION MAKING: Stable/uncomplicated  EVALUATION COMPLEXITY: Low   GOALS: Goals reviewed with patient? Yes  SHORT TERM GOALS: Target date: 12/29/21  Patient will be independent with initial HEP.  Goal status: MET  LONG TERM GOALS: Target date: 02/26/22  Patient will be independent with advanced/ongoing HEP to improve outcomes and carryover.  Goal status: IN PROGRESS  2.  Patient will report 75%  improvement in neck pain to improve QOL.   Baseline: 9/28- not even 50% improvement maybe 40% Goal status: IN PROGRESS  3.  Patient will demonstrate full pain free cervical ROM to complete ADLs and play with grandchildren with more ease. Goal status: met 4.  Patient will demonstrate improved posture to decrease muscle imbalance and tightness.  Baseline: increased tightness along cervical paraspinals Goal status: progressing still tight piriformis  5.  Patient will report 23 on FOTO to demonstrate improved functional ability.  Baseline:  9/28- 64  Goal status: MET but has regressed as she is doing more and more  6.  Will demonstrate improved core strength as evidenced by passing double leg lower test  Baseline:  Goal status: progressing    PLAN: PT FREQUENCY: 2x/week  PT DURATION: 4 weeks  PLANNED INTERVENTIONS: Therapeutic exercises, Therapeutic activity, Neuromuscular re-education, Balance training, Gait training, Patient/Family education, Self Care, Joint mobilization, Dry Needling, Spinal manipulation, Spinal mobilization, Cryotherapy, Moist heat, Taping, Vasopneumatic device, Traction, Ionotophoresis 53m/ml Dexamethasone, and Manual therapy  PLAN FOR NEXT SESSION:  rpush her to see if we can continue to help her overall fitness and function MLum Babe PT 06/23/2022, 8:33 AM   [place] [address]Phone: [phone]  Fax:  [phone]

## 2022-06-28 ENCOUNTER — Ambulatory Visit: Payer: Medicare Other | Admitting: Physical Therapy

## 2022-06-28 DIAGNOSIS — M6281 Muscle weakness (generalized): Secondary | ICD-10-CM

## 2022-06-28 DIAGNOSIS — R519 Headache, unspecified: Secondary | ICD-10-CM | POA: Diagnosis not present

## 2022-06-28 DIAGNOSIS — G8929 Other chronic pain: Secondary | ICD-10-CM | POA: Diagnosis not present

## 2022-06-28 DIAGNOSIS — M542 Cervicalgia: Secondary | ICD-10-CM | POA: Diagnosis not present

## 2022-06-28 NOTE — Therapy (Signed)
OUTPATIENT PHYSICAL THERAPY CERVICAL TREATMENT    Patient Name: Veronica Galvan MRN: [id number] DOB:April 06, 1956, 67 y.o., female Today's Date: 06/28/2022    Past Medical History:  Diagnosis Date   Allergy    Anxiety    Arthritis    hands   Cataract    Diabetes mellitus without complication ([place])    type 2   Family history of adverse reaction to anesthesia    son has malignant hyperthermia, daughter does not daughter recently had c section without problems   GERD (gastroesophageal reflux disease)    Headache    sinus   Hyperlipidemia    Hypertension    PONV (postoperative nausea and vomiting)    nausea only   Ulcer, stomach peptic yrs ago   Past Surgical History:  Procedure Laterality Date   APPENDECTOMY     both hells bone spur repair     both heels with metal clips   both shoulder rotator cuff repair     CESAREAN SECTION     x 1   COLONOSCOPY WITH PROPOFOL N/A 09/07/2016   Procedure: COLONOSCOPY WITH PROPOFOL;  Surgeon: Garlan Fair, MD;  Location: [facility];  Service: Endoscopy;  Laterality: N/A;   colonscopy  06/2011   polyps   EYE SURGERY     FRACTURE SURGERY     TUBAL LIGATION     VESICO-VAGINAL FISTULA REPAIR     Patient Active Problem List   Diagnosis Date Noted   Gastroesophageal reflux disease 11/16/2021   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 08/18/2021   Dermatofibroma 08/18/2021   History of malignant neoplasm of skin 08/18/2021   Lentigo 08/18/2021   Melanocytic nevi of trunk 08/18/2021   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   Rosacea 08/18/2021   Actinic keratosis 08/18/2021   Sensorineural hearing loss (SNHL) of left ear with unrestricted hearing of right ear 07/26/2021   Tinnitus of left ear 07/26/2021   Acute recurrent maxillary sinusitis 06/22/2021   Primary hypertension 01/12/2021   Hyperlipidemia associated with type 2 diabetes mellitus ([place]) 01/12/2021   Arthritis 01/12/2021   Type II  diabetes mellitus ([place]) 11/25/2019   Ingrown toenail 09/05/2018   Neck pain 01/29/2018   Tick bite 10/24/2017    PCP: Jodi Mourning  REFERRING PROVIDER: Genia Harold  REFERRING DIAG: M54.2  THERAPY DIAG:  Cervicalgia  Muscle weakness (generalized)  Chronic intractable headache, unspecified headache type  Rationale for Evaluation and Treatment Rehabilitation  ONSET DATE: 11/08/21  SUBJECTIVE:  SUBJECTIVE STATEMENT: Patient reports that she has been very tight in the neck and shoulders, she is still having the HA's, she reports that she feels that she is doing well with the watch and does have a few questions PERTINENT HISTORY:  Arthritis, HTN, bilateral rotator cuff surgery 15-11yr ago, DM  PAIN:  Are you having pain? Yes: NPRS scale: 08/10 Pain location: head and neck, shoulders  Pain description: tight  Aggravating factors: moving Relieving factors: massage, standing in the shower  WEIGHT BEARING RESTRICTIONS No  FALLS:  Has patient fallen in last 6 months? No  LIVING ENVIRONMENT: Lives with: lives alone Lives in: House/apartment Stairs: Yes: External: 3 steps; can reach both Has following equipment at home: None  OCCUPATION: reitred  PLOF: Independent  PATIENT GOALS to loosen it up, find out what is causing my headaches   OBJECTIVE:   DIAGNOSTIC FINDINGS:   FINDINGS: Brain: No acute infarct, mass effect or extra-axial collection. No acute or chronic hemorrhage. Normal white matter signal, parenchymal volume and CSF spaces. The midline structures are normal.  Skull and upper cervical spine: Normal calvarium and skull base. Visualized upper cervical spine and soft tissues are normal.   Sinuses/Orbits:No paranasal sinus fluid levels or advanced  mucosal thickening. No mastoid or middle ear effusion. Normal orbits.   IMPRESSION: Normal brain MRI.  PATIENT SURVEYS:  FOTO 53/100 ,  April 08, 2022 has been up to 640and today was 542but has been doing a lot and lifting and is back doing much more than she was.  January 4,2024 57/100 she does report tha tshe wnet backwards from her previous due to her being able to do so much more but is hurting more   COGNITION: Overall cognitive status: Within functional limits for tasks assessed  SENSATION: WFL  POSTURE: rounded shoulders, forward head, and increased thoracic kyphosis  PALPATION: TTP and sore along upper trap and cervical paraspinals, trigger points in R and L UT   CERVICAL ROM:   Active ROM A/PROM (deg) eval 12/30/21 01/05/22 9/28 10/31 04/05/22  Flexion 100% no pain WFL WFL WNL   WNL  Extension Mild limitations w/[facility] WFL WNL   WNL  Right lateral flexion Mod limitation w/pain Mild limitations Mild limitations Severe limitation, leans with trunk  Decreased 75%  Decreased 50%  Left lateral flexion Mod limitation w/pain Mod limitations Mild limitations Severe limitation, leans with trunk  Decreased 75% Decreased  50%  Right rotation 75% w/pain Mild limitation w/pain WFL pain at end range  Mild limitation  Decreased 25%  Decreased 25%  Left rotation 75% w/pain  Mild limitation w/pain  WFL pain at end range  Mild limitation  Decreased 25% Decreased  25%   (Blank rows = not tested)  UPPER EXTREMITY ROM: WFL, limited in IR and ER due to prior RC surgery years ago   UPPER EXTREMITY MMT:  MMT Right eval Left eval Right 01/05/22 Left 01/05/22 R 9/28 L 9/28  Shoulder flexion 4+ 4+ '5 5 5 5  '$ Shoulder extension        Shoulder abduction 4+ very painful 4+ 4+ with pain '5 5 5  '$ Shoulder adduction        Shoulder extension        Shoulder internal rotation 4+ 4+ '5 5 5 5  '$ Shoulder external rotation     5 5  Middle trapezius        Lower trapezius        Elbow flexion 5 5  Elbow extension 5 5      Wrist flexion        Wrist extension        Wrist ulnar deviation        Wrist radial deviation        Wrist pronation        Wrist supination        Grip strength         (Blank rows = not tested) 05/05/22 LE MMT  right 4+/5, left 4/5  SLR 40 degrees with pain in the legs and the back  Cannot cross her legs due to tight piriformis 25 degrees hip ER  Abdominal strength 2/5 unable to sit up or hold at 40 degrees  5x STS 21 seconds 06/14/22 TUG 10 seconds  5XSTS 16 seconds  Right 4+/5, left 4+/5  Manually I can cross her legs fully, she reports able to get   Socks on now, she can on her own without hands get her  Heels to mid patella  SLR to 55 degrees  Can hold plank 10 seconds elbows and toes  Had her A1C go down from 8.2 to 7.8     TODAY'S TREATMENT:  06/28/22 Walk outside at a brisk pace Leg curls 25# 3x10 Leg press 60# 3x10, 20# SL 2x10 Went over fit bit and how to read and examine further her HR and her activity status Bosu ball balance Passive HS and piriformis stretches Feet on ball bridges Planks 20 seconds 3x Hips abduction and extension 10# 2x10 each  06/23/22 Nustep Level 5 x 5 minutes Walk around the building in back and a brisk pace 15# straight arm extension 15#AR press Tmill push 20 seconds x 4 Bike x 6 minutes 3 power bursts Worked with her on set up of her fit bit and what to look at and what is important to monitor Plank 10s x 3  06/21/22 UBE level 4 x 6 minutes Walk outside around the back building at a good pace Leg curls 25# Leg extension 10# Nustep level 5 x 5 minutes working on keeping her SPM > 80, she did very well but her HR was up to 133 bpm, after 4 minutes 111 bpm Tmill push 20s x 3 AR press 15# 10# straight arm pulls Bird dogs Planks 60# 3x10 leg press  06/15/22 UBE level 4 x 6 minutes Nustep level 5 x 5 minutes Gait around the back building trying to keep a brisk pace 40# resisted gait all  directions 10# straight arm pulls 15# AR press Leg press 50# Doorway stretch Passive LE stretches Bird dogs  06/14/22 Nustep level 5 x 6 minutes Passive stretch of the LE's Plank 10s x3 5 # hip abduction, extension and flexion LEg press 40# 2x10 10# straight arm press  15# AR press 20# lats  06/02/22 Nustep level 5 x 6 minutes 15# straight arm pulls 2x10 15# AR press  weighted ball trunk rotation to wall Weighted ball overhead reach Door stretch Leg press 40# 2x10, 60# x 10 5# hip flexion, extension and abduction 2x10 Bird dogs Plans 10s x 3  05/31/22 UBE level 4 x 6 minutes 10# straight arm pulls 15# AR press 25# triceps 10# biceps 2x10 5# hip extension and abduction 5# Hip flexion Seated row 25# Lats 25# Quadraped alternating arms and legs Plank 10 seconds x 2 cues for breathing PROM LE's    PATIENT EDUCATION:  Education details: POC Person educated: Patient Education method: Explanation Education comprehension: verbalized  understanding   HOME EXERCISE PROGRAM: Access Code: [email or web address] URL: [email or web address] Date: 12/01/2021 Prepared by: Andris Baumann   ASSESSMENT:  CLINICAL IMPRESSION: Patient seems to be doing better, she is thinking more about her overall health and I do think the watch gives her a lot of information, she does allow more stretch of the LE's REHAB POTENTIAL: Good  CLINICAL DECISION MAKING: Stable/uncomplicated  EVALUATION COMPLEXITY: Low   GOALS: Goals reviewed with patient? Yes  SHORT TERM GOALS: Target date: 12/29/21  Patient will be independent with initial HEP.  Goal status: MET  LONG TERM GOALS: Target date: 02/26/22  Patient will be independent with advanced/ongoing HEP to improve outcomes and carryover.  Goal status: IN PROGRESS  2.  Patient will report 75% improvement in neck pain to improve QOL.   Baseline: 9/28- not even 50% improvement maybe 40% Goal status: IN PROGRESS  3.  Patient will  demonstrate full pain free cervical ROM to complete ADLs and play with grandchildren with more ease. Goal status: met 4.  Patient will demonstrate improved posture to decrease muscle imbalance and tightness.  Baseline: increased tightness along cervical paraspinals Goal status: progressing still tight piriformis  5.  Patient will report 6 on FOTO to demonstrate improved functional ability.  Baseline:  9/28- 64  Goal status: MET but has regressed as she is doing more and more  6.  Will demonstrate improved core strength as evidenced by passing double leg lower test  Baseline:  Goal status: progressing    PLAN: PT FREQUENCY: 2x/week  PT DURATION: 4 weeks  PLANNED INTERVENTIONS: Therapeutic exercises, Therapeutic activity, Neuromuscular re-education, Balance training, Gait training, Patient/Family education, Self Care, Joint mobilization, Dry Needling, Spinal manipulation, Spinal mobilization, Cryotherapy, Moist heat, Taping, Vasopneumatic device, Traction, Ionotophoresis '4mg'$ /ml Dexamethasone, and Manual therapy  PLAN FOR NEXT SESSION:  rpush her to see if we can continue to help her overall fitness and function Lum Babe, PT 06/28/2022, 8:41 AM   [place] [facility] [place]. [place], [place], [address] Phone: [phone]   Fax:  [phone]

## 2022-06-30 ENCOUNTER — Encounter: Payer: Self-pay | Admitting: Physical Therapy

## 2022-06-30 ENCOUNTER — Ambulatory Visit: Payer: Medicare Other | Admitting: Physical Therapy

## 2022-06-30 DIAGNOSIS — R519 Headache, unspecified: Secondary | ICD-10-CM | POA: Diagnosis not present

## 2022-06-30 DIAGNOSIS — M542 Cervicalgia: Secondary | ICD-10-CM

## 2022-06-30 DIAGNOSIS — G8929 Other chronic pain: Secondary | ICD-10-CM | POA: Diagnosis not present

## 2022-06-30 DIAGNOSIS — M6281 Muscle weakness (generalized): Secondary | ICD-10-CM | POA: Diagnosis not present

## 2022-06-30 NOTE — Therapy (Signed)
OUTPATIENT PHYSICAL THERAPY CERVICAL TREATMENT    Patient Name: Veronica Galvan MRN: [id number] DOB:06-29-1955, 67 y.o., female Today's Date: 06/30/2022    Past Medical History:  Diagnosis Date   Allergy    Anxiety    Arthritis    hands   Cataract    Diabetes mellitus without complication ([place])    type 2   Family history of adverse reaction to anesthesia    son has malignant hyperthermia, daughter does not daughter recently had c section without problems   GERD (gastroesophageal reflux disease)    Headache    sinus   Hyperlipidemia    Hypertension    PONV (postoperative nausea and vomiting)    nausea only   Ulcer, stomach peptic yrs ago   Past Surgical History:  Procedure Laterality Date   APPENDECTOMY     both hells bone spur repair     both heels with metal clips   both shoulder rotator cuff repair     CESAREAN SECTION     x 1   COLONOSCOPY WITH PROPOFOL N/A 09/07/2016   Procedure: COLONOSCOPY WITH PROPOFOL;  Surgeon: Garlan Fair, MD;  Location: [facility];  Service: Endoscopy;  Laterality: N/A;   colonscopy  06/2011   polyps   EYE SURGERY     FRACTURE SURGERY     TUBAL LIGATION     VESICO-VAGINAL FISTULA REPAIR     Patient Active Problem List   Diagnosis Date Noted   Gastroesophageal reflux disease 11/16/2021   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 08/18/2021   Dermatofibroma 08/18/2021   History of malignant neoplasm of skin 08/18/2021   Lentigo 08/18/2021   Melanocytic nevi of trunk 08/18/2021   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   Rosacea 08/18/2021   Actinic keratosis 08/18/2021   Sensorineural hearing loss (SNHL) of left ear with unrestricted hearing of right ear 07/26/2021   Tinnitus of left ear 07/26/2021   Acute recurrent maxillary sinusitis 06/22/2021   Primary hypertension 01/12/2021   Hyperlipidemia associated with type 2 diabetes mellitus ([place]) 01/12/2021   Arthritis 01/12/2021   Type II  diabetes mellitus ([place]) 11/25/2019   Ingrown toenail 09/05/2018   Neck pain 01/29/2018   Tick bite 10/24/2017    PCP: Jodi Mourning  REFERRING PROVIDER: Genia Harold  REFERRING DIAG: M54.2  THERAPY DIAG:  Cervicalgia  Muscle weakness (generalized)  Chronic intractable headache, unspecified headache type  Rationale for Evaluation and Treatment Rehabilitation  ONSET DATE: 11/08/21  SUBJECTIVE:  SUBJECTIVE STATEMENT: Tired but feeling better and stronger and able to tolerate more activity PERTINENT HISTORY:  Arthritis, HTN, bilateral rotator cuff surgery 15-85yr ago, DM  PAIN:  Are you having pain? Yes: NPRS scale: 06/10 Pain location: head and neck, shoulders  Pain description: tight  Aggravating factors: moving Relieving factors: massage, standing in the shower  WEIGHT BEARING RESTRICTIONS No  FALLS:  Has patient fallen in last 6 months? No  LIVING ENVIRONMENT: Lives with: lives alone Lives in: House/apartment Stairs: Yes: External: 3 steps; can reach both Has following equipment at home: None  OCCUPATION: reitred  PLOF: Independent  PATIENT GOALS to loosen it up, find out what is causing my headaches   OBJECTIVE:   DIAGNOSTIC FINDINGS:   FINDINGS: Brain: No acute infarct, mass effect or extra-axial collection. No acute or chronic hemorrhage. Normal white matter signal, parenchymal volume and CSF spaces. The midline structures are normal.  Skull and upper cervical spine: Normal calvarium and skull base. Visualized upper cervical spine and soft tissues are normal.   Sinuses/Orbits:No paranasal sinus fluid levels or advanced mucosal thickening. No mastoid or middle ear effusion. Normal orbits.   IMPRESSION: Normal brain MRI.  PATIENT SURVEYS:  FOTO  53/100 ,  April 08, 2022 has been up to 665and today was 535but has been doing a lot and lifting and is back doing much more than she was.  January 4,2024 57/100 she does report tha tshe wnet backwards from her previous due to her being able to do so much more but is hurting more   COGNITION: Overall cognitive status: Within functional limits for tasks assessed  SENSATION: WFL  POSTURE: rounded shoulders, forward head, and increased thoracic kyphosis  PALPATION: TTP and sore along upper trap and cervical paraspinals, trigger points in R and L UT   CERVICAL ROM:   Active ROM A/PROM (deg) eval 12/30/21 01/05/22 9/28 10/31 04/05/22  Flexion 100% no pain WFL WFL WNL   WNL  Extension Mild limitations w/pain [facility] W[facility] WNL   WNL  Right lateral flexion Mod limitation w/pain Mild limitations Mild limitations Severe limitation, leans with trunk  Decreased 75%  Decreased 50%  Left lateral flexion Mod limitation w/pain Mod limitations Mild limitations Severe limitation, leans with trunk  Decreased 75% Decreased  50%  Right rotation 75% w/pain Mild limitation w/pain WFL pain at end range  Mild limitation  Decreased 25%  Decreased 25%  Left rotation 75% w/pain  Mild limitation w/pain  WFL pain at end range  Mild limitation  Decreased 25% Decreased  25%   (Blank rows = not tested)  UPPER EXTREMITY ROM: WFL, limited in IR and ER due to prior RC surgery years ago   UPPER EXTREMITY MMT:  MMT Right eval Left eval Right 01/05/22 Left 01/05/22 R 9/28 L 9/28  Shoulder flexion 4+ 4+ '5 5 5 5  '$ Shoulder extension        Shoulder abduction 4+ very painful 4+ 4+ with pain '5 5 5  '$ Shoulder adduction        Shoulder extension        Shoulder internal rotation 4+ 4+ '5 5 5 5  '$ Shoulder external rotation     5 5  Middle trapezius        Lower trapezius        Elbow flexion 5 5      Elbow extension 5 5      Wrist flexion        Wrist extension  Wrist ulnar deviation        Wrist radial deviation         Wrist pronation        Wrist supination        Grip strength         (Blank rows = not tested) 05/05/22 LE MMT  right 4+/5, left 4/5  SLR 40 degrees with pain in the legs and the back  Cannot cross her legs due to tight piriformis 25 degrees hip ER  Abdominal strength 2/5 unable to sit up or hold at 40 degrees  5x STS 21 seconds 06/14/22 TUG 10 seconds  5XSTS 16 seconds  Right 4+/5, left 4+/5  Manually I can cross her legs fully, she reports able to get   Socks on now, she can on her own without hands get her  Heels to mid patella  SLR to 55 degrees  Can hold plank 10 seconds elbows and toes  Had her A1C go down from 8.2 to 7.8     TODAY'S TREATMENT:  06/30/22 Walk around the back building larger lap brisk pace Elliptical 2 minutes level 4 10# straight arm pulls 10# hip abduction and extension 35# leg curls 3x10 10# leg extension 3x10 Bosu balance standing head turns and reaching Leg press 60# 3x10 Passive stretch HS and piriformis  06/28/22 Walk outside at a brisk pace Leg curls 25# 3x10 Leg press 60# 3x10, 20# SL 2x10 Went over fit bit and how to read and examine further her HR and her activity status Bosu ball balance Passive HS and piriformis stretches Feet on ball bridges Planks 20 seconds 3x Hips abduction and extension 10# 2x10 each  06/23/22 Nustep Level 5 x 5 minutes Walk around the building in back and a brisk pace 15# straight arm extension 15#AR press Tmill push 20 seconds x 4 Bike x 6 minutes 3 power bursts Worked with her on set up of her fit bit and what to look at and what is important to monitor Plank 10s x 3  06/21/22 UBE level 4 x 6 minutes Walk outside around the back building at a good pace Leg curls 25# Leg extension 10# Nustep level 5 x 5 minutes working on keeping her SPM > 80, she did very well but her HR was up to 133 bpm, after 4 minutes 111 bpm Tmill push 20s x 3 AR press 15# 10# straight arm pulls Bird dogs Planks 60# 3x10 leg  press  06/15/22 UBE level 4 x 6 minutes Nustep level 5 x 5 minutes Gait around the back building trying to keep a brisk pace 40# resisted gait all directions 10# straight arm pulls 15# AR press Leg press 50# Doorway stretch Passive LE stretches Bird dogs  06/14/22 Nustep level 5 x 6 minutes Passive stretch of the LE's Plank 10s x3 5 # hip abduction, extension and flexion LEg press 40# 2x10 10# straight arm press  15# AR press 20# lats    PATIENT EDUCATION:  Education details: POC Person educated: Patient Education method: Explanation Education comprehension: verbalized understanding   HOME EXERCISE PROGRAM: Access Code: [email or web address] URL: [email or web address] Date: 12/01/2021 Prepared by: Andris Baumann   ASSESSMENT:  CLINICAL IMPRESSION: We continue to push her abilities, I added the elliptical today and this was difficult for her, we also added some bosu activities to work on balance and core, she struggled early this week but was much better today.  She does not c/o the pain and HA's as much but  she is still having them daily, just reports that the intensity most of the time is less REHAB POTENTIAL: Good  CLINICAL DECISION MAKING: Stable/uncomplicated  EVALUATION COMPLEXITY: Low   GOALS: Goals reviewed with patient? Yes  SHORT TERM GOALS: Target date: 12/29/21  Patient will be independent with initial HEP.  Goal status: MET  LONG TERM GOALS: Target date: 02/26/22  Patient will be independent with advanced/ongoing HEP to improve outcomes and carryover.  Goal status: IN PROGRESS  2.  Patient will report 75% improvement in neck pain to improve QOL.   Baseline: 9/28- not even 50% improvement maybe 40% Goal status: IN PROGRESS  3.  Patient will demonstrate full pain free cervical ROM to complete ADLs and play with grandchildren with more ease. Goal status: met 4.  Patient will demonstrate improved posture to decrease muscle imbalance and  tightness.  Baseline: increased tightness along cervical paraspinals Goal status: progressing still tight piriformis  5.  Patient will report 23 on FOTO to demonstrate improved functional ability.  Baseline:  9/28- 64  Goal status: MET but has regressed as she is doing more and more  6.  Will demonstrate improved core strength as evidenced by passing double leg lower test  Baseline:  Goal status: progressing    PLAN: PT FREQUENCY: 2x/week  PT DURATION: 4 weeks  PLANNED INTERVENTIONS: Therapeutic exercises, Therapeutic activity, Neuromuscular re-education, Balance training, Gait training, Patient/Family education, Self Care, Joint mobilization, Dry Needling, Spinal manipulation, Spinal mobilization, Cryotherapy, Moist heat, Taping, Vasopneumatic device, Traction, Ionotophoresis '4mg'$ /ml Dexamethasone, and Manual therapy  PLAN FOR NEXT SESSION:  push her to see if we can continue to help her overall fitness and function Lum Babe, PT 06/30/2022, 9:17 AM   [facility]. [place], [place], [address] Phone: [phone]   Fax:  [phone]

## 2022-07-05 ENCOUNTER — Ambulatory Visit: Payer: Medicare Other | Attending: Psychiatry | Admitting: Physical Therapy

## 2022-07-05 ENCOUNTER — Encounter: Payer: Self-pay | Admitting: Physical Therapy

## 2022-07-05 DIAGNOSIS — M542 Cervicalgia: Secondary | ICD-10-CM | POA: Diagnosis not present

## 2022-07-05 DIAGNOSIS — G8929 Other chronic pain: Secondary | ICD-10-CM | POA: Insufficient documentation

## 2022-07-05 DIAGNOSIS — M6281 Muscle weakness (generalized): Secondary | ICD-10-CM | POA: Diagnosis not present

## 2022-07-05 DIAGNOSIS — R519 Headache, unspecified: Secondary | ICD-10-CM | POA: Insufficient documentation

## 2022-07-05 NOTE — Therapy (Signed)
OUTPATIENT PHYSICAL THERAPY CERVICAL TREATMENT    Patient Name: Veronica Galvan MRN: [id number] DOB:1955-06-29, 67 y.o., female Today's Date: 07/05/2022    Past Medical History:  Diagnosis Date   Allergy    Anxiety    Arthritis    hands   Cataract    Diabetes mellitus without complication ([place])    type 2   Family history of adverse reaction to anesthesia    son has malignant hyperthermia, daughter does not daughter recently had c section without problems   GERD (gastroesophageal reflux disease)    Headache    sinus   Hyperlipidemia    Hypertension    PONV (postoperative nausea and vomiting)    nausea only   Ulcer, stomach peptic yrs ago   Past Surgical History:  Procedure Laterality Date   APPENDECTOMY     both hells bone spur repair     both heels with metal clips   both shoulder rotator cuff repair     CESAREAN SECTION     x 1   COLONOSCOPY WITH PROPOFOL N/A 09/07/2016   Procedure: COLONOSCOPY WITH PROPOFOL;  Surgeon: Garlan Fair, MD;  Location: [facility];  Service: Endoscopy;  Laterality: N/A;   colonscopy  06/2011   polyps   EYE SURGERY     FRACTURE SURGERY     TUBAL LIGATION     VESICO-VAGINAL FISTULA REPAIR     Patient Active Problem List   Diagnosis Date Noted   Gastroesophageal reflux disease 11/16/2021   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 08/18/2021   Dermatofibroma 08/18/2021   History of malignant neoplasm of skin 08/18/2021   Lentigo 08/18/2021   Melanocytic nevi of trunk 08/18/2021   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   Rosacea 08/18/2021   Actinic keratosis 08/18/2021   Sensorineural hearing loss (SNHL) of left ear with unrestricted hearing of right ear 07/26/2021   Tinnitus of left ear 07/26/2021   Acute recurrent maxillary sinusitis 06/22/2021   Primary hypertension 01/12/2021   Hyperlipidemia associated with type 2 diabetes mellitus ([place]) 01/12/2021   Arthritis 01/12/2021   Type II  diabetes mellitus ([place]) 11/25/2019   Ingrown toenail 09/05/2018   Neck pain 01/29/2018   Tick bite 10/24/2017    PCP: Jodi Mourning  REFERRING PROVIDER: Genia Harold  REFERRING DIAG: M54.2  THERAPY DIAG:  Cervicalgia  Muscle weakness (generalized)  Chronic intractable headache, unspecified headache type  Rationale for Evaluation and Treatment Rehabilitation  ONSET DATE: 11/08/21  SUBJECTIVE:  SUBJECTIVE STATEMENT: Reports that she was very sore PERTINENT HISTORY:  Arthritis, HTN, bilateral rotator cuff surgery 15-69yr ago, DM  PAIN:  Are you having pain? Yes: NPRS scale: 06/10 Pain location: head and neck, shoulders  Pain description: tight  Aggravating factors: moving Relieving factors: massage, standing in the shower  WEIGHT BEARING RESTRICTIONS No  FALLS:  Has patient fallen in last 6 months? No  LIVING ENVIRONMENT: Lives with: lives alone Lives in: House/apartment Stairs: Yes: External: 3 steps; can reach both Has following equipment at home: None  OCCUPATION: reitred  PLOF: Independent  PATIENT GOALS to loosen it up, find out what is causing my headaches   OBJECTIVE:   DIAGNOSTIC FINDINGS:   FINDINGS: Brain: No acute infarct, mass effect or extra-axial collection. No acute or chronic hemorrhage. Normal white matter signal, parenchymal volume and CSF spaces. The midline structures are normal.  Skull and upper cervical spine: Normal calvarium and skull base. Visualized upper cervical spine and soft tissues are normal.   Sinuses/Orbits:No paranasal sinus fluid levels or advanced mucosal thickening. No mastoid or middle ear effusion. Normal orbits.   IMPRESSION: Normal brain MRI.  PATIENT SURVEYS:  FOTO 53/100 ,  April 08, 2022 has been up to 615 and today was 558but has been doing a lot and lifting and is back doing much more than she was.  January 4,2024 57/100 she does report tha tshe wnet backwards from her previous due to her being able to do so much more but is hurting more   COGNITION: Overall cognitive status: Within functional limits for tasks assessed  SENSATION: WFL  POSTURE: rounded shoulders, forward head, and increased thoracic kyphosis  PALPATION: TTP and sore along upper trap and cervical paraspinals, trigger points in R and L UT   CERVICAL ROM:   Active ROM A/PROM (deg) eval 12/30/21 01/05/22 9/28 10/31 04/05/22  Flexion 100% no pain WFL WFL WNL   WNL  Extension Mild limitations w/pain [facility] WFL WNL   WNL  Right lateral flexion Mod limitation w/pain Mild limitations Mild limitations Severe limitation, leans with trunk  Decreased 75%  Decreased 50%  Left lateral flexion Mod limitation w/pain Mod limitations Mild limitations Severe limitation, leans with trunk  Decreased 75% Decreased  50%  Right rotation 75% w/pain Mild limitation w/pain WFL pain at end range  Mild limitation  Decreased 25%  Decreased 25%  Left rotation 75% w/pain  Mild limitation w/pain  WFL pain at end range  Mild limitation  Decreased 25% Decreased  25%   (Blank rows = not tested)  UPPER EXTREMITY ROM: WFL, limited in IR and ER due to prior RC surgery years ago   UPPER EXTREMITY MMT:  MMT Right eval Left eval Right 01/05/22 Left 01/05/22 R 9/28 L 9/28  Shoulder flexion 4+ 4+ '5 5 5 5  '$ Shoulder extension        Shoulder abduction 4+ very painful 4+ 4+ with pain '5 5 5  '$ Shoulder adduction        Shoulder extension        Shoulder internal rotation 4+ 4+ '5 5 5 5  '$ Shoulder external rotation     5 5  Middle trapezius        Lower trapezius        Elbow flexion 5 5      Elbow extension 5 5      Wrist flexion        Wrist extension  Wrist ulnar deviation        Wrist radial deviation        Wrist pronation        Wrist supination         Grip strength         (Blank rows = not tested) 05/05/22 LE MMT  right 4+/5, left 4/5  SLR 40 degrees with pain in the legs and the back  Cannot cross her legs due to tight piriformis 25 degrees hip ER  Abdominal strength 2/5 unable to sit up or hold at 40 degrees  5x STS 21 seconds 06/14/22 TUG 10 seconds  5XSTS 16 seconds  Right 4+/5, left 4+/5  Manually I can cross her legs fully, she reports able to get   Socks on now, she can on her own without hands get her  Heels to mid patella  SLR to 55 degrees  Can hold plank 10 seconds elbows and toes  Had her A1C go down from 8.2 to 7.8     TODAY'S TREATMENT:  07/05/22 Walk around the back building fast pace Tmill push fwd and backward 20s x 3 each Seated row 25# 2x15 Lats 25# 2x15 10# straight arm pulls 10# hip flexion, abduction and extension Elliptical 2 minutes On bosu with 3# biceps, overhead press, punches 2 ways 10# sit to stands from low surface  06/30/22 Walk around the back building larger lap brisk pace Elliptical 2 minutes level 4 10# straight arm pulls 10# hip abduction and extension 35# leg curls 3x10 10# leg extension 3x10 Bosu balance standing head turns and reaching Leg press 60# 3x10 Passive stretch HS and piriformis  06/28/22 Walk outside at a brisk pace Leg curls 25# 3x10 Leg press 60# 3x10, 20# SL 2x10 Went over fit bit and how to read and examine further her HR and her activity status Bosu ball balance Passive HS and piriformis stretches Feet on ball bridges Planks 20 seconds 3x Hips abduction and extension 10# 2x10 each  06/23/22 Nustep Level 5 x 5 minutes Walk around the building in back and a brisk pace 15# straight arm extension 15#AR press Tmill push 20 seconds x 4 Bike x 6 minutes 3 power bursts Worked with her on set up of her fit bit and what to look at and what is important to monitor Plank 10s x 3  06/21/22 UBE level 4 x 6 minutes Walk outside around the back building at a good  pace Leg curls 25# Leg extension 10# Nustep level 5 x 5 minutes working on keeping her SPM > 80, she did very well but her HR was up to 133 bpm, after 4 minutes 111 bpm Tmill push 20s x 3 AR press 15# 10# straight arm pulls Bird dogs Planks 60# 3x10 leg press  06/15/22 UBE level 4 x 6 minutes Nustep level 5 x 5 minutes Gait around the back building trying to keep a brisk pace 40# resisted gait all directions 10# straight arm pulls 15# AR press Leg press 50# Doorway stretch Passive LE stretches Bird dogs  06/14/22 Nustep level 5 x 6 minutes Passive stretch of the LE's Plank 10s x3 5 # hip abduction, extension and flexion LEg press 40# 2x10 10# straight arm press  15# AR press 20# lats    PATIENT EDUCATION:  Education details: POC Person educated: Patient Education method: Explanation Education comprehension: verbalized understanding   HOME EXERCISE PROGRAM: Access Code: [email or web address] URL: [email or web address] Date: 12/01/2021 Prepared by: Andris Baumann  ASSESSMENT:  CLINICAL IMPRESSION: REally pushing her to be stronger and be able to do more functionally, pushing her HR to 140 bpm at times.  Trying to do functional strength.  And her balance and overall fitness REHAB POTENTIAL: Good  CLINICAL DECISION MAKING: Stable/uncomplicated  EVALUATION COMPLEXITY: Low   GOALS: Goals reviewed with patient? Yes  SHORT TERM GOALS: Target date: 12/29/21  Patient will be independent with initial HEP.  Goal status: MET  LONG TERM GOALS: Target date: 02/26/22  Patient will be independent with advanced/ongoing HEP to improve outcomes and carryover.  Goal status: IN PROGRESS  2.  Patient will report 75% improvement in neck pain to improve QOL.   Baseline: 9/28- not even 50% improvement maybe 40% Goal status: IN PROGRESS  3.  Patient will demonstrate full pain free cervical ROM to complete ADLs and play with grandchildren with more ease. Goal status:  met 4.  Patient will demonstrate improved posture to decrease muscle imbalance and tightness.  Baseline: increased tightness along cervical paraspinals Goal status: progressing still tight piriformis  5.  Patient will report 28 on FOTO to demonstrate improved functional ability.  Baseline:  9/28- 64  Goal status: MET but has regressed as she is doing more and more  6.  Will demonstrate improved core strength as evidenced by passing double leg lower test  Baseline:  Goal status: progressing    PLAN: PT FREQUENCY: 2x/week  PT DURATION: 4 weeks  PLANNED INTERVENTIONS: Therapeutic exercises, Therapeutic activity, Neuromuscular re-education, Balance training, Gait training, Patient/Family education, Self Care, Joint mobilization, Dry Needling, Spinal manipulation, Spinal mobilization, Cryotherapy, Moist heat, Taping, Vasopneumatic device, Traction, Ionotophoresis '4mg'$ /ml Dexamethasone, and Manual therapy  PLAN FOR NEXT SESSION:  push her to see if we can continue to help her overall fitness and function Lum Babe, PT 07/05/2022, 8:40 AM   [facility]. [place], [place], [address] Phone: [phone]   Fax:  [phone]

## 2022-07-06 ENCOUNTER — Encounter: Payer: Self-pay | Admitting: Physical Therapy

## 2022-07-06 ENCOUNTER — Ambulatory Visit: Payer: Medicare Other | Admitting: Physical Therapy

## 2022-07-06 DIAGNOSIS — R519 Headache, unspecified: Secondary | ICD-10-CM | POA: Diagnosis not present

## 2022-07-06 DIAGNOSIS — M542 Cervicalgia: Secondary | ICD-10-CM | POA: Diagnosis not present

## 2022-07-06 DIAGNOSIS — M6281 Muscle weakness (generalized): Secondary | ICD-10-CM

## 2022-07-06 DIAGNOSIS — G8929 Other chronic pain: Secondary | ICD-10-CM | POA: Diagnosis not present

## 2022-07-06 NOTE — Therapy (Signed)
OUTPATIENT PHYSICAL THERAPY CERVICAL TREATMENT    Patient Name: Veronica Galvan MRN: [id number] DOB:28-Nov-1955, 67 y.o., female Today's Date: 07/06/2022  PT End of Session - 07/06/22 1102     Visit Number 16    Date for PT Re-Evaluation 07/13/22    PT Start Time 1055    PT Stop Time 1143    PT Time Calculation (min) 48 min    Activity Tolerance Patient tolerated treatment well    Behavior During Therapy WFL for tasks assessed/performed               Past Medical History:  Diagnosis Date   Allergy    Anxiety    Arthritis    hands   Cataract    Diabetes mellitus without complication ([place])    type 2   Family history of adverse reaction to anesthesia    son has malignant hyperthermia, daughter does not daughter recently had c section without problems   GERD (gastroesophageal reflux disease)    Headache    sinus   Hyperlipidemia    Hypertension    PONV (postoperative nausea and vomiting)    nausea only   Ulcer, stomach peptic yrs ago   Past Surgical History:  Procedure Laterality Date   APPENDECTOMY     both hells bone spur repair     both heels with metal clips   both shoulder rotator cuff repair     CESAREAN SECTION     x 1   COLONOSCOPY WITH PROPOFOL N/A 09/07/2016   Procedure: COLONOSCOPY WITH PROPOFOL;  Surgeon: Garlan Fair, MD;  Location: [facility];  Service: Endoscopy;  Laterality: N/A;   colonscopy  06/2011   polyps   EYE SURGERY     FRACTURE SURGERY     TUBAL LIGATION     VESICO-VAGINAL FISTULA REPAIR     Patient Active Problem List   Diagnosis Date Noted   Gastroesophageal reflux disease 11/16/2021   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 08/18/2021   Dermatofibroma 08/18/2021   History of malignant neoplasm of skin 08/18/2021   Lentigo 08/18/2021   Melanocytic nevi of trunk 08/18/2021   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   Rosacea 08/18/2021   Actinic keratosis 08/18/2021   Sensorineural hearing  loss (SNHL) of left ear with unrestricted hearing of right ear 07/26/2021   Tinnitus of left ear 07/26/2021   Acute recurrent maxillary sinusitis 06/22/2021   Primary hypertension 01/12/2021   Hyperlipidemia associated with type 2 diabetes mellitus ([place]) 01/12/2021   Arthritis 01/12/2021   Type II diabetes mellitus ([place]) 11/25/2019   Ingrown toenail 09/05/2018   Neck pain 01/29/2018   Tick bite 10/24/2017    PCP: Jodi Mourning  REFERRING PROVIDER: Genia Harold  REFERRING DIAG: M54.2  THERAPY DIAG:  Cervicalgia  Muscle weakness (generalized)  Chronic intractable headache, unspecified headache type  Rationale for Evaluation and Treatment Rehabilitation  ONSET DATE: 11/08/21  SUBJECTIVE:  SUBJECTIVE STATEMENT: Reports that she feels that she is moving better, was a little sore but not bad just in the mms PERTINENT HISTORY:  Arthritis, HTN, bilateral rotator cuff surgery 15-57yr ago, DM  PAIN:  Are you having pain? Yes: NPRS scale: 06/10 Pain location: head and neck, shoulders  Pain description: tight  Aggravating factors: moving Relieving factors: massage, standing in the shower  WEIGHT BEARING RESTRICTIONS No  FALLS:  Has patient fallen in last 6 months? No  LIVING ENVIRONMENT: Lives with: lives alone Lives in: House/apartment Stairs: Yes: External: 3 steps; can reach both Has following equipment at home: None  OCCUPATION: reitred  PLOF: Independent  PATIENT GOALS to loosen it up, find out what is causing my headaches   OBJECTIVE:   DIAGNOSTIC FINDINGS:   FINDINGS: Brain: No acute infarct, mass effect or extra-axial collection. No acute or chronic hemorrhage. Normal white matter signal, parenchymal volume and CSF spaces. The midline structures are  normal.  Skull and upper cervical spine: Normal calvarium and skull base. Visualized upper cervical spine and soft tissues are normal.   Sinuses/Orbits:No paranasal sinus fluid levels or advanced mucosal thickening. No mastoid or middle ear effusion. Normal orbits.   IMPRESSION: Normal brain MRI.  PATIENT SURVEYS:  FOTO 53/100 ,  April 08, 2022 has been up to 624and today was 57but has been doing a lot and lifting and is back doing much more than she was.  January 4,2024 57/100 she does report tha tshe wnet backwards from her previous due to her being able to do so much more but is hurting more   COGNITION: Overall cognitive status: Within functional limits for tasks assessed  SENSATION: WFL  POSTURE: rounded shoulders, forward head, and increased thoracic kyphosis  PALPATION: TTP and sore along upper trap and cervical paraspinals, trigger points in R and L UT   CERVICAL ROM:   Active ROM A/PROM (deg) eval 12/30/21 01/05/22 9/28 10/31 04/05/22  Flexion 100% no pain WFL WFL WNL   WNL  Extension [facility] limitations [facility] W[facility] WNL   WNL  Right lateral flexion Mod limitation w/pain Mild limitations Mild limitations Severe limitation, leans with trunk  Decreased 75%  Decreased 50%  Left lateral flexion Mod limitation w/pain Mod limitations Mild limitations Severe limitation, leans with trunk  Decreased 75% Decreased  50%  Right rotation 75% w/pain Mild limitation w/pain WFL pain at end range  Mild limitation  Decreased 25%  Decreased 25%  Left rotation 75% w/pain  Mild limitation w/pain  WFL pain at end range  Mild limitation  Decreased 25% Decreased  25%   (Blank rows = not tested)  UPPER EXTREMITY ROM: WFL, limited in IR and ER due to prior RC surgery years ago   UPPER EXTREMITY MMT:  MMT Right eval Left eval Right 01/05/22 Left 01/05/22 R 9/28 L 9/28  Shoulder flexion 4+ 4+ '5 5 5 5  '$ Shoulder extension        Shoulder abduction 4+ very painful 4+ 4+ with pain '5 5 5   '$ Shoulder adduction        Shoulder extension        Shoulder internal rotation 4+ 4+ '5 5 5 5  '$ Shoulder external rotation     5 5  Middle trapezius        Lower trapezius        Elbow flexion 5 5      Elbow extension 5 5      Wrist flexion  Wrist extension        Wrist ulnar deviation        Wrist radial deviation        Wrist pronation        Wrist supination        Grip strength         (Blank rows = not tested) 05/05/22 LE MMT  right 4+/5, left 4/5  SLR 40 degrees with pain in the legs and the back  Cannot cross her legs due to tight piriformis 25 degrees hip ER  Abdominal strength 2/5 unable to sit up or hold at 40 degrees  5x STS 21 seconds 06/14/22 TUG 10 seconds  5XSTS 16 seconds  Right 4+/5, left 4+/5  Manually I can cross her legs fully, she reports able to get   Socks on now, she can on her own without hands get her  Heels to mid patella  SLR to 55 degrees  Can hold plank 10 seconds elbows and toes  Had her A1C go down from 8.2 to 7.8     TODAY'S TREATMENT:  07/06/22 Gait on lap brisk pace around the back building Elliptical level 4 x 2 minutes On bosu two ways with 3# hand weights various alternating motions to challenge her balance 15# AR press 35# straight arm pull with the bicep bar Passive stretch to the LE's really focus on low load long duration to gain ROM  07/05/22 Walk around the back building fast pace Tmill push fwd and backward 20s x 3 each Seated row 25# 2x15 Lats 25# 2x15 10# straight arm pulls 10# hip flexion, abduction and extension Elliptical 2 minutes On bosu with 3# biceps, overhead press, punches 2 ways 10# sit to stands from low surface  06/30/22 Walk around the back building larger lap brisk pace Elliptical 2 minutes level 4 10# straight arm pulls 10# hip abduction and extension 35# leg curls 3x10 10# leg extension 3x10 Bosu balance standing head turns and reaching Leg press 60# 3x10 Passive stretch HS and  piriformis  06/28/22 Walk outside at a brisk pace Leg curls 25# 3x10 Leg press 60# 3x10, 20# SL 2x10 Went over fit bit and how to read and examine further her HR and her activity status Bosu ball balance Passive HS and piriformis stretches Feet on ball bridges Planks 20 seconds 3x Hips abduction and extension 10# 2x10 each  06/23/22 Nustep Level 5 x 5 minutes Walk around the building in back and a brisk pace 15# straight arm extension 15#AR press Tmill push 20 seconds x 4 Bike x 6 minutes 3 power bursts Worked with her on set up of her fit bit and what to look at and what is important to monitor Plank 10s x 3  06/21/22 UBE level 4 x 6 minutes Walk outside around the back building at a good pace Leg curls 25# Leg extension 10# Nustep level 5 x 5 minutes working on keeping her SPM > 80, she did very well but her HR was up to 133 bpm, after 4 minutes 111 bpm Tmill push 20s x 3 AR press 15# 10# straight arm pulls Bird dogs Planks 60# 3x10 leg press  06/15/22 UBE level 4 x 6 minutes Nustep level 5 x 5 minutes Gait around the back building trying to keep a brisk pace 40# resisted gait all directions 10# straight arm pulls 15# AR press Leg press 50# Doorway stretch Passive LE stretches Bird dogs  06/14/22 Nustep level 5 x 6 minutes  Passive stretch of the LE's Plank 10s x3 5 # hip abduction, extension and flexion LEg press 40# 2x10 10# straight arm press  15# AR press 20# lats    PATIENT EDUCATION:  Education details: POC Person educated: Patient Education method: Explanation Education comprehension: verbalized understanding   HOME EXERCISE PROGRAM: Access Code: [email or web address] URL: [email or web address] Date: 12/01/2021 Prepared by: Andris Baumann   ASSESSMENT:  CLINICAL IMPRESSION: Since this is two days in a row we did some balance strength endurance but then at the end really focused on the stretching REHAB POTENTIAL: Good  CLINICAL DECISION  MAKING: Stable/uncomplicated  EVALUATION COMPLEXITY: Low   GOALS: Goals reviewed with patient? Yes  SHORT TERM GOALS: Target date: 12/29/21  Patient will be independent with initial HEP.  Goal status: MET  LONG TERM GOALS: Target date: 02/26/22  Patient will be independent with advanced/ongoing HEP to improve outcomes and carryover.  Goal status: IN PROGRESS  2.  Patient will report 75% improvement in neck pain to improve QOL.   Baseline: 9/28- not even 50% improvement maybe 40% Goal status: IN PROGRESS  3.  Patient will demonstrate full pain free cervical ROM to complete ADLs and play with grandchildren with more ease. Goal status: met 4.  Patient will demonstrate improved posture to decrease muscle imbalance and tightness.  Baseline: increased tightness along cervical paraspinals Goal status: progressing still tight piriformis  5.  Patient will report 40 on FOTO to demonstrate improved functional ability.  Baseline:  9/28- 64  Goal status: MET but has regressed as she is doing more and more  6.  Will demonstrate improved core strength as evidenced by passing double leg lower test  Baseline:  Goal status: progressing    PLAN: PT FREQUENCY: 2x/week  PT DURATION: 4 weeks  PLANNED INTERVENTIONS: Therapeutic exercises, Therapeutic activity, Neuromuscular re-education, Balance training, Gait training, Patient/Family education, Self Care, Joint mobilization, Dry Needling, Spinal manipulation, Spinal mobilization, Cryotherapy, Moist heat, Taping, Vasopneumatic device, Traction, Ionotophoresis '4mg'$ /ml Dexamethasone, and Manual therapy  PLAN FOR NEXT SESSION:  push her to see if we can continue to help her overall fitness and function Lum Babe, PT 07/06/2022, 11:48 AM   [facility] [place]. [place], [place], [address] Phone: [phone]   Fax:  [phone]

## 2022-07-12 ENCOUNTER — Encounter: Payer: Self-pay | Admitting: Physical Therapy

## 2022-07-12 ENCOUNTER — Ambulatory Visit: Payer: Medicare Other | Admitting: Physical Therapy

## 2022-07-12 DIAGNOSIS — M6281 Muscle weakness (generalized): Secondary | ICD-10-CM | POA: Diagnosis not present

## 2022-07-12 DIAGNOSIS — M542 Cervicalgia: Secondary | ICD-10-CM

## 2022-07-12 DIAGNOSIS — R519 Headache, unspecified: Secondary | ICD-10-CM | POA: Diagnosis not present

## 2022-07-12 DIAGNOSIS — G8929 Other chronic pain: Secondary | ICD-10-CM

## 2022-07-12 NOTE — Therapy (Signed)
OUTPATIENT PHYSICAL THERAPY CERVICAL TREATMENT  Progress Note Reporting Period 06/02/22 to 07/12/22 for visits 51-60  See note below for Objective Data and Assessment of Progress/Goals.      Patient Name: Veronica Galvan MRN: [id number] DOB:1955/09/01, 67 y.o., female Today's Date: 07/12/2022  PT End of Session - 07/12/22 0851     Visit Number 36    Date for PT Re-Evaluation 07/13/22    PT Start Time 0843    PT Stop Time 0930    PT Time Calculation (min) 47 min    Activity Tolerance Patient tolerated treatment well    Behavior During Therapy [facility] for tasks assessed/performed               Past Medical History:  Diagnosis Date   Allergy    Anxiety    Arthritis    hands   Cataract    Diabetes mellitus without complication ([place])    type 2   Family history of adverse reaction to anesthesia    son has malignant hyperthermia, daughter does not daughter recently had c section without problems   GERD (gastroesophageal reflux disease)    Headache    sinus   Hyperlipidemia    Hypertension    PONV (postoperative nausea and vomiting)    nausea only   Ulcer, stomach peptic yrs ago   Past Surgical History:  Procedure Laterality Date   APPENDECTOMY     both hells bone spur repair     both heels with metal clips   both shoulder rotator cuff repair     CESAREAN SECTION     x 1   COLONOSCOPY WITH PROPOFOL N/A 09/07/2016   Procedure: COLONOSCOPY WITH PROPOFOL;  Surgeon: Garlan Fair, MD;  Location: [facility];  Service: Endoscopy;  Laterality: N/A;   colonscopy  06/2011   polyps   EYE SURGERY     FRACTURE SURGERY     TUBAL LIGATION     VESICO-VAGINAL FISTULA REPAIR     Patient Active Problem List   Diagnosis Date Noted   Gastroesophageal reflux disease 11/16/2021   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 08/18/2021   Dermatofibroma 08/18/2021   History of malignant neoplasm of skin 08/18/2021   Lentigo 08/18/2021   Melanocytic nevi of trunk  08/18/2021   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   Rosacea 08/18/2021   Actinic keratosis 08/18/2021   Sensorineural hearing loss (SNHL) of left ear with unrestricted hearing of right ear 07/26/2021   Tinnitus of left ear 07/26/2021   Acute recurrent maxillary sinusitis 06/22/2021   Primary hypertension 01/12/2021   Hyperlipidemia associated with type 2 diabetes mellitus ([place]) 01/12/2021   Arthritis 01/12/2021   Type II diabetes mellitus ([place]) 11/25/2019   Ingrown toenail 09/05/2018   Neck pain 01/29/2018   Tick bite 10/24/2017    PCP: Jodi Mourning  REFERRING PROVIDER: Genia Harold  REFERRING DIAG: M54.2  THERAPY DIAG:  Cervicalgia  Chronic intractable headache, unspecified headache type  Muscle weakness (generalized)  Rationale for Evaluation and Treatment Rehabilitation  ONSET DATE: 11/08/21  SUBJECTIVE:  SUBJECTIVE STATEMENT: C/o some hip pain and foot pain today, reports that she thinks it is because she is doing so much more PERTINENT HISTORY:  Arthritis, HTN, bilateral rotator cuff surgery 15-31yr ago, DM  PAIN:  Are you having pain? Yes: NPRS scale: 06/10 Pain location: head and neck, shoulders  Pain description: tight  Aggravating factors: moving Relieving factors: massage, standing in the shower  WEIGHT BEARING RESTRICTIONS No  FALLS:  Has patient fallen in last 6 months? No  LIVING ENVIRONMENT: Lives with: lives alone Lives in: House/apartment Stairs: Yes: External: 3 steps; can reach both Has following equipment at home: None  OCCUPATION: reitred  PLOF: Independent  PATIENT GOALS to loosen it up, find out what is causing my headaches   OBJECTIVE:   DIAGNOSTIC FINDINGS:   FINDINGS: Brain: No acute infarct, mass effect or  extra-axial collection. No acute or chronic hemorrhage. Normal white matter signal, parenchymal volume and CSF spaces. The midline structures are normal.  Skull and upper cervical spine: Normal calvarium and skull base. Visualized upper cervical spine and soft tissues are normal.   Sinuses/Orbits:No paranasal sinus fluid levels or advanced mucosal thickening. No mastoid or middle ear effusion. Normal orbits.   IMPRESSION: Normal brain MRI.  PATIENT SURVEYS:  FOTO 53/100 ,  April 08, 2022 has been up to 62and today was 558but has been doing a lot and lifting and is back doing much more than she was.  January 4,2024 57/100 she does report tha tshe wnet backwards from her previous due to her being able to do so much more but is hurting more   COGNITION: Overall cognitive status: Within functional limits for tasks assessed  SENSATION: WFL  POSTURE: rounded shoulders, forward head, and increased thoracic kyphosis  PALPATION: TTP and sore along upper trap and cervical paraspinals, trigger points in R and L UT   CERVICAL ROM:   Active ROM A/PROM (deg) eval 12/30/21 01/05/22 9/28 10/31 04/05/22  Flexion 100% no pain WFL WFL WNL   WNL  Extension Mild limitations w/[facility] WFL WNL   WNL  Right lateral flexion Mod limitation w/pain Mild limitations Mild limitations Severe limitation, leans with trunk  Decreased 75%  Decreased 50%  Left lateral flexion Mod limitation w/pain Mod limitations Mild limitations Severe limitation, leans with trunk  Decreased 75% Decreased  50%  Right rotation 75% w/pain Mild limitation w/pain WFL pain at end range  Mild limitation  Decreased 25%  Decreased 25%  Left rotation 75% w/pain  Mild limitation w/pain  WFL pain at end range  Mild limitation  Decreased 25% Decreased  25%   (Blank rows = not tested)  UPPER EXTREMITY ROM: WFL, limited in IR and ER due to prior RC surgery years ago   UPPER EXTREMITY MMT:  MMT Right eval Left eval Right 01/05/22  Left 01/05/22 R 9/28 L 9/28  Shoulder flexion 4+ 4+ '5 5 5 5  '$ Shoulder extension        Shoulder abduction 4+ very painful 4+ 4+ with pain '5 5 5  '$ Shoulder adduction        Shoulder extension        Shoulder internal rotation 4+ 4+ '5 5 5 5  '$ Shoulder external rotation     5 5  Middle trapezius        Lower trapezius        Elbow flexion 5 5      Elbow extension 5 5      Wrist flexion  Wrist extension        Wrist ulnar deviation        Wrist radial deviation        Wrist pronation        Wrist supination        Grip strength         (Blank rows = not tested) 05/05/22 LE MMT  right 4+/5, left 4/5  SLR 40 degrees with pain in the legs and the back  Cannot cross her legs due to tight piriformis 25 degrees hip ER  Abdominal strength 2/5 unable to sit up or hold at 40 degrees  5x STS 21 seconds 06/14/22 TUG 10 seconds  5XSTS 16 seconds  Right 4+/5, left 4+/5  Manually I can cross her legs fully, she reports able to get   Socks on now, she can on her own without hands get her  Heels to mid patella  SLR to 55 degrees  Can hold plank 10 seconds elbows and toes  Had her A1C go down from 8.2 to 7.8     TODAY'S TREATMENT:  07/12/22 Weight 207# Brisk walk around the back building without rest and able to carry on a conversation 35# leg curls 10# leg extension Passive stretch LE's Education in calories in and out, carbs, protein and fiber 25# rows 2x15 Lats 25# 2x15 Elliptical 3 minutes level 4  07/06/22 Gait on lap brisk pace around the back building Elliptical level 4 x 2 minutes On bosu two ways with 3# hand weights various alternating motions to challenge her balance 15# AR press 35# straight arm pull with the bicep bar Passive stretch to the LE's really focus on low load long duration to gain ROM  07/05/22 Walk around the back building fast pace Tmill push fwd and backward 20s x 3 each Seated row 25# 2x15 Lats 25# 2x15 10# straight arm pulls 10# hip flexion, abduction  and extension Elliptical 2 minutes On bosu with 3# biceps, overhead press, punches 2 ways 10# sit to stands from low surface  06/30/22 Walk around the back building larger lap brisk pace Elliptical 2 minutes level 4 10# straight arm pulls 10# hip abduction and extension 35# leg curls 3x10 10# leg extension 3x10 Bosu balance standing head turns and reaching Leg press 60# 3x10 Passive stretch HS and piriformis  06/28/22 Walk outside at a brisk pace Leg curls 25# 3x10 Leg press 60# 3x10, 20# SL 2x10 Went over fit bit and how to read and examine further her HR and her activity status Bosu ball balance Passive HS and piriformis stretches Feet on ball bridges Planks 20 seconds 3x Hips abduction and extension 10# 2x10 each  06/23/22 Nustep Level 5 x 5 minutes Walk around the building in back and a brisk pace 15# straight arm extension 15#AR press Tmill push 20 seconds x 4 Bike x 6 minutes 3 power bursts Worked with her on set up of her fit bit and what to look at and what is important to monitor Plank 10s x 3  06/21/22 UBE level 4 x 6 minutes Walk outside around the back building at a good pace Leg curls 25# Leg extension 10# Nustep level 5 x 5 minutes working on keeping her SPM > 80, she did very well but her HR was up to 133 bpm, after 4 minutes 111 bpm Tmill push 20s x 3 AR press 15# 10# straight arm pulls Bird dogs Planks 60# 3x10 leg press  06/15/22 UBE level 4 x  6 minutes Nustep level 5 x 5 minutes Gait around the back building trying to keep a brisk pace 40# resisted gait all directions 10# straight arm pulls 15# AR press Leg press 50# Doorway stretch Passive LE stretches Bird dogs  06/14/22 Nustep level 5 x 6 minutes Passive stretch of the LE's Plank 10s x3 5 # hip abduction, extension and flexion LEg press 40# 2x10 10# straight arm press  15# AR press 20# lats    PATIENT EDUCATION:  Education details: POC Person educated: Patient Education  method: Explanation Education comprehension: verbalized understanding   HOME EXERCISE PROGRAM: Access Code: [email or web address] URL: [email or web address] Date: 12/01/2021 Prepared by: Andris Baumann   ASSESSMENT:  CLINICAL IMPRESSION: Patient reports the desire to be healthier, she reports that over the past 6 months she has lowered her A1C, she has become much more active and bought a fit bit and has asked very good questions about how to use it, she has joined some classes at the gym and goes regularly, she expresses interest in losing weight.  With discussing this with her and educating her it seems that this is more of a habit she has of overeating and making poor choices.   REHAB POTENTIAL: Good  CLINICAL DECISION MAKING: Stable/uncomplicated  EVALUATION COMPLEXITY: Low   GOALS: Goals reviewed with patient? Yes  SHORT TERM GOALS: Target date: 12/29/21  Patient will be independent with initial HEP.  Goal status: MET  LONG TERM GOALS: Target date: 02/26/22  Patient will be independent with advanced/ongoing HEP to improve outcomes and carryover.  Goal status: IN PROGRESS  2.  Patient will report 75% improvement in neck pain to improve QOL.   Baseline: 9/28- not even 50% improvement maybe 40% Goal status: IN PROGRESS  3.  Patient will demonstrate full pain free cervical ROM to complete ADLs and play with grandchildren with more ease. Goal status: met 4.  Patient will demonstrate improved posture to decrease muscle imbalance and tightness.  Baseline: increased tightness along cervical paraspinals Goal status: progressing still tight piriformis  5.  Patient will report 30 on FOTO to demonstrate improved functional ability.  Baseline:  9/28- 64  Goal status: MET but has regressed as she is doing more and more  6.  Will demonstrate improved core strength as evidenced by passing double leg lower test  Baseline:  Goal status: progressing    PLAN: PT FREQUENCY:  2x/week  PT DURATION: 4 weeks  PLANNED INTERVENTIONS: Therapeutic exercises, Therapeutic activity, Neuromuscular re-education, Balance training, Gait training, Patient/Family education, Self Care, Joint mobilization, Dry Needling, Spinal manipulation, Spinal mobilization, Cryotherapy, Moist heat, Taping, Vasopneumatic device, Traction, Ionotophoresis '4mg'$ /ml Dexamethasone, and Manual therapy  PLAN FOR NEXT SESSION:  next visit assess and continue education on her health and weight loss Lum Babe, PT 07/12/2022, 8:52 AM   [facility] [place]. [place], [place], [address] Phone: [phone]   Fax:  [phone]

## 2022-07-14 ENCOUNTER — Encounter: Payer: Self-pay | Admitting: Physical Therapy

## 2022-07-14 ENCOUNTER — Ambulatory Visit: Payer: Medicare Other | Admitting: Physical Therapy

## 2022-07-14 DIAGNOSIS — M6281 Muscle weakness (generalized): Secondary | ICD-10-CM

## 2022-07-14 DIAGNOSIS — M542 Cervicalgia: Secondary | ICD-10-CM

## 2022-07-14 DIAGNOSIS — R519 Headache, unspecified: Secondary | ICD-10-CM | POA: Diagnosis not present

## 2022-07-14 DIAGNOSIS — G8929 Other chronic pain: Secondary | ICD-10-CM | POA: Diagnosis not present

## 2022-07-14 NOTE — Therapy (Signed)
OUTPATIENT PHYSICAL THERAPY CERVICAL TREATMENT    Patient Name: Veronica Galvan MRN: [id number] DOB:[birth year], 67 y.o., female Today's Date: 07/14/2022  PT End of Session - 07/14/22 0937     Visit Number 72    Date for PT Re-Evaluation 08/14/22    PT Start Time 0927    PT Stop Time 1015    PT Time Calculation (min) 48 min    Activity Tolerance Patient tolerated treatment well    Behavior During Therapy [facility] for tasks assessed/performed               Past Medical History:  Diagnosis Date   Allergy    Anxiety    Arthritis    hands   Cataract    Diabetes mellitus without complication ([place])    type 2   Family history of adverse reaction to anesthesia    son has malignant hyperthermia, daughter does not daughter recently had c section without problems   GERD (gastroesophageal reflux disease)    Headache    sinus   Hyperlipidemia    Hypertension    PONV (postoperative nausea and vomiting)    nausea only   Ulcer, stomach peptic yrs ago   Past Surgical History:  Procedure Laterality Date   APPENDECTOMY     both hells bone spur repair     both heels with metal clips   both shoulder rotator cuff repair     CESAREAN SECTION     x 1   COLONOSCOPY WITH PROPOFOL N/A 09/07/2016   Procedure: COLONOSCOPY WITH PROPOFOL;  Surgeon: Garlan Fair, MD;  Location: [facility];  Service: Endoscopy;  Laterality: N/A;   colonscopy  06/2011   polyps   EYE SURGERY     FRACTURE SURGERY     TUBAL LIGATION     VESICO-VAGINAL FISTULA REPAIR     Patient Active Problem List   Diagnosis Date Noted   Gastroesophageal reflux disease 11/16/2021   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 08/18/2021   Dermatofibroma 08/18/2021   History of malignant neoplasm of skin 08/18/2021   Lentigo 08/18/2021   Melanocytic nevi of trunk 08/18/2021   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   Rosacea 08/18/2021   Actinic keratosis 08/18/2021   Sensorineural  hearing loss (SNHL) of left ear with unrestricted hearing of right ear 07/26/2021   Tinnitus of left ear 07/26/2021   Acute recurrent maxillary sinusitis 06/22/2021   Primary hypertension 01/12/2021   Hyperlipidemia associated with type 2 diabetes mellitus ([place]) 01/12/2021   Arthritis 01/12/2021   Type II diabetes mellitus ([place]) 11/25/2019   Ingrown toenail 09/05/2018   Neck pain 01/29/2018   Tick bite 10/24/2017    PCP: Jodi Mourning  REFERRING PROVIDER: Genia Harold  REFERRING DIAG: M54.2  THERAPY DIAG:  Cervicalgia  Chronic intractable headache, unspecified headache type  Muscle weakness (generalized)  Rationale for Evaluation and Treatment Rehabilitation  ONSET DATE: 11/08/21  SUBJECTIVE:  SUBJECTIVE STATEMENT: Reports doing well PERTINENT HISTORY:  Arthritis, HTN, bilateral rotator cuff surgery 15-32yr ago, DM  PAIN:  Are you having pain? Yes: NPRS scale: 06/10 Pain location: head and neck, shoulders  Pain description: tight  Aggravating factors: moving Relieving factors: massage, standing in the shower  WEIGHT BEARING RESTRICTIONS No  FALLS:  Has patient fallen in last 6 months? No  LIVING ENVIRONMENT: Lives with: lives alone Lives in: House/apartment Stairs: Yes: External: 3 steps; can reach both Has following equipment at home: None  OCCUPATION: reitred  PLOF: Independent  PATIENT GOALS to loosen it up, find out what is causing my headaches   OBJECTIVE:   DIAGNOSTIC FINDINGS:   FINDINGS: Brain: No acute infarct, mass effect or extra-axial collection. No acute or chronic hemorrhage. Normal white matter signal, parenchymal volume and CSF spaces. The midline structures are normal.  Skull and upper cervical spine: Normal calvarium and skull  base. Visualized upper cervical spine and soft tissues are normal.   Sinuses/Orbits:No paranasal sinus fluid levels or advanced mucosal thickening. No mastoid or middle ear effusion. Normal orbits.   IMPRESSION: Normal brain MRI.  PATIENT SURVEYS:  FOTO 53/100 ,  April 08, 2022 has been up to 639and today was 574but has been doing a lot and lifting and is back doing much more than she was.  January 4,2024 57/100 she does report tha tshe wnet backwards from her previous due to her being able to do so much more but is hurting more   COGNITION: Overall cognitive status: Within functional limits for tasks assessed  SENSATION: WFL  POSTURE: rounded shoulders, forward head, and increased thoracic kyphosis  PALPATION: TTP and sore along upper trap and cervical paraspinals, trigger points in R and L UT   CERVICAL ROM:   Active ROM A/PROM (deg) eval 12/30/21 01/05/22 9/28 10/31 04/05/22  Flexion 100% no pain WFL WFL WNL   WNL  [facility]   WNL  Right lateral flexion Mod limitation w/pain Mild limitations Mild limitations Severe limitation, leans with trunk  Decreased 75%  Decreased 50%  Left lateral flexion Mod limitation w/pain Mod limitations Mild limitations Severe limitation, leans with trunk  Decreased 75% Decreased  50%  Right rotation 75% w/pain Mild limitation w/pain WFL pain at end range  Mild limitation  Decreased 25%  Decreased 25%  Left rotation 75% w/pain  Mild limitation w/pain  WFL pain at end range  Mild limitation  Decreased 25% Decreased  25%   (Blank rows = not tested)  UPPER EXTREMITY ROM: WFL, limited in IR and ER due to prior RC surgery years ago   UPPER EXTREMITY MMT:  MMT Right eval Left eval Right 01/05/22 Left 01/05/22 R 9/28 L 9/28  Shoulder flexion 4+ 4+ '5 5 5 5  '$ Shoulder extension        Shoulder abduction 4+ very painful 4+ 4+ with pain '5 5 5  '$ Shoulder adduction        Shoulder extension        Shoulder internal  rotation 4+ 4+ '5 5 5 5  '$ Shoulder external rotation     5 5  Middle trapezius        Lower trapezius        Elbow flexion 5 5      Elbow extension 5 5      Wrist flexion        Wrist extension        Wrist ulnar  deviation        Wrist radial deviation        Wrist pronation        Wrist supination        Grip strength         (Blank rows = not tested) 05/05/22 LE MMT  right 4+/5, left 4/5  SLR 40 degrees with pain in the legs and the back  Cannot cross her legs due to tight piriformis 25 degrees hip ER  Abdominal strength 2/5 unable to sit up or hold at 40 degrees  5x STS 21 seconds 06/14/22 TUG 10 seconds  5XSTS 16 seconds  Right 4+/5, left 4+/5  Manually I can cross her legs fully, she reports able to get   Socks on now, she can on her own without hands get her  Heels to mid patella  SLR to 55 degrees  Can hold plank 10 seconds elbows and toes  Had her A1C go down from 8.2 to 7.8   07/14/22  can hold plank 20 seconds x4  TUG 7 seconds  TODAY'S TREATMENT:  07/14/22 Weight 204# Fast walk around the back building 10# Hip extension and abduction 15# AR press 15# straight arm pulls Leg press 60# 2x15 Planks 4x20 seconds On bosu balance 3# alternating work  07/12/22 Weight 207# Brisk walk around the back building without rest and able to carry on a conversation 35# leg curls 10# leg extension Passive stretch LE's Education in calories in and out, carbs, protein and fiber 25# rows 2x15 Lats 25# 2x15 Elliptical 3 minutes level 4  07/06/22 Gait on lap brisk pace around the back building Elliptical level 4 x 2 minutes On bosu two ways with 3# hand weights various alternating motions to challenge her balance 15# AR press 35# straight arm pull with the bicep bar Passive stretch to the LE's really focus on low load long duration to gain ROM  07/05/22 Walk around the back building fast pace Tmill push fwd and backward 20s x 3 each Seated row 25# 2x15 Lats 25# 2x15 10#  straight arm pulls 10# hip flexion, abduction and extension Elliptical 2 minutes On bosu with 3# biceps, overhead press, punches 2 ways 10# sit to stands from low surface  06/30/22 Walk around the back building larger lap brisk pace Elliptical 2 minutes level 4 10# straight arm pulls 10# hip abduction and extension 35# leg curls 3x10 10# leg extension 3x10 Bosu balance standing head turns and reaching Leg press 60# 3x10 Passive stretch HS and piriformis  06/28/22 Walk outside at a brisk pace Leg curls 25# 3x10 Leg press 60# 3x10, 20# SL 2x10 Went over fit bit and how to read and examine further her HR and her activity status Bosu ball balance Passive HS and piriformis stretches Feet on ball bridges Planks 20 seconds 3x Hips abduction and extension 10# 2x10 each  06/23/22 Nustep Level 5 x 5 minutes Walk around the building in back and a brisk pace 15# straight arm extension 15#AR press Tmill push 20 seconds x 4 Bike x 6 minutes 3 power bursts Worked with her on set up of her fit bit and what to look at and what is important to monitor Plank 10s x 3  06/21/22 UBE level 4 x 6 minutes Walk outside around the back building at a good pace Leg curls 25# Leg extension 10# Nustep level 5 x 5 minutes working on keeping her SPM > 80, she did very well but her HR  was up to 133 bpm, after 4 minutes 111 bpm Tmill push 20s x 3 AR press 15# 10# straight arm pulls Bird dogs Planks 60# 3x10 leg press   PATIENT EDUCATION:  Education details: POC Person educated: Patient Education method: Explanation Education comprehension: verbalized understanding   HOME EXERCISE PROGRAM: Access Code: [email or web address] URL: [email or web address] Date: 12/01/2021 Prepared by: Andris Baumann   ASSESSMENT:  CLINICAL IMPRESSION: I have been seeing this patient for a long time and we have changed our approach multiple times to address different issues and help her with her health, function  and overall quality of life.  We started out treating pain and significant HA's the pain and tension is much less, still with the HA's but reports intensity is much less.  We have switched to a fitness, strength, endurance and functional treatment.  She has made great gains in her strength, and her overall functional ability, she has decreased her weight and her A1C, she has started talking and thinking about her own walking and exercise routine and she wanted to know more about nutrition and calories and she reports that she is trying to make better choices. REHAB POTENTIAL: Good  CLINICAL DECISION MAKING: Stable/uncomplicated  EVALUATION COMPLEXITY: Low   GOALS: Goals reviewed with patient? Yes  SHORT TERM GOALS: Target date: 12/29/21  Patient will be independent with initial HEP.  Goal status: MET  LONG TERM GOALS: Target date: 02/26/22  Patient will be independent with advanced/ongoing HEP to improve outcomes and carryover.  Goal status: IN PROGRESS  2.  Patient will report 75% improvement in neck pain to improve QOL.   Baseline: 9/28- not even 50% improvement maybe 40% Goal status: met  3.  Patient will demonstrate full pain free cervical ROM to complete ADLs and play with grandchildren with more ease. Goal status: met 4.  Patient will demonstrate improved posture to decrease muscle imbalance and tightness.  Baseline: increased tightness along cervical paraspinals Goal status: progressing still tight piriformis  5.  Patient will report 28 on FOTO to demonstrate improved functional ability.  Baseline:  9/28- 64  Goal status: MET but has regressed as she is doing more and more  6.  Will demonstrate improved core strength as evidenced by passing double leg lower test  Baseline:  Goal status: progressing 7.  Average 11,000 steps a day on her fit bit over a month period   PLAN: PT FREQUENCY: 2x/week  PT DURATION: 4 weeks  PLANNED INTERVENTIONS: Therapeutic exercises,  Therapeutic activity, Neuromuscular re-education, Balance training, Gait training, Patient/Family education, Self Care, Joint mobilization, Dry Needling, Spinal manipulation, Spinal mobilization, Cryotherapy, Moist heat, Taping, Vasopneumatic device, Traction, Ionotophoresis '4mg'$ /ml Dexamethasone, and Manual therapy  PLAN FOR NEXT SESSION:  I sent renewal to MD regarding her progress and our plan Lum Babe, PT 07/14/2022, 10:06 AM   [facility] [place] [place], [place], [address] Phone: [phone]   Fax:  [phone]

## 2022-07-19 ENCOUNTER — Ambulatory Visit: Payer: Medicare Other | Admitting: Physical Therapy

## 2022-07-19 ENCOUNTER — Encounter: Payer: Self-pay | Admitting: Family

## 2022-07-19 ENCOUNTER — Other Ambulatory Visit: Payer: Self-pay | Admitting: Family

## 2022-07-19 ENCOUNTER — Encounter: Payer: Self-pay | Admitting: Physical Therapy

## 2022-07-19 DIAGNOSIS — R519 Headache, unspecified: Secondary | ICD-10-CM | POA: Diagnosis not present

## 2022-07-19 DIAGNOSIS — M6281 Muscle weakness (generalized): Secondary | ICD-10-CM

## 2022-07-19 DIAGNOSIS — M542 Cervicalgia: Secondary | ICD-10-CM | POA: Diagnosis not present

## 2022-07-19 DIAGNOSIS — G8929 Other chronic pain: Secondary | ICD-10-CM | POA: Diagnosis not present

## 2022-07-19 MED ORDER — FLUCONAZOLE 100 MG PO TABS: 100.0000 mg | ORAL_TABLET | Freq: Every day | ORAL | 0 refills | Status: DC

## 2022-07-19 NOTE — Therapy (Signed)
OUTPATIENT PHYSICAL THERAPY CERVICAL TREATMENT    Patient Name: Veronica Galvan MRN: [id number] DOB:Sep 14, 1955, 67 y.o., female Today's Date: 07/19/2022  PT End of Session - 07/19/22 0840     Visit Number 62    Date for PT Re-Evaluation 08/14/22    PT Start Time 0840    PT Stop Time 0925    PT Time Calculation (min) 45 min    Activity Tolerance Patient tolerated treatment well    Behavior During Therapy [facility] for tasks assessed/performed               Past Medical History:  Diagnosis Date   Allergy    Anxiety    Arthritis    hands   Cataract    Diabetes mellitus without complication ([place])    type 2   Family history of adverse reaction to anesthesia    son has malignant hyperthermia, daughter does not daughter recently had c section without problems   GERD (gastroesophageal reflux disease)    Headache    sinus   Hyperlipidemia    Hypertension    PONV (postoperative nausea and vomiting)    nausea only   Ulcer, stomach peptic yrs ago   Past Surgical History:  Procedure Laterality Date   APPENDECTOMY     both hells bone spur repair     both heels with metal clips   both shoulder rotator cuff repair     CESAREAN SECTION     x 1   COLONOSCOPY WITH PROPOFOL N/A 09/07/2016   Procedure: COLONOSCOPY WITH PROPOFOL;  Surgeon: Garlan Fair, MD;  Location: [facility];  Service: Endoscopy;  Laterality: N/A;   colonscopy  06/2011   polyps   EYE SURGERY     FRACTURE SURGERY     TUBAL LIGATION     VESICO-VAGINAL FISTULA REPAIR     Patient Active Problem List   Diagnosis Date Noted   Gastroesophageal reflux disease 11/16/2021   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 08/18/2021   Dermatofibroma 08/18/2021   History of malignant neoplasm of skin 08/18/2021   Lentigo 08/18/2021   Melanocytic nevi of trunk 08/18/2021   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   Rosacea 08/18/2021   Actinic keratosis 08/18/2021   Sensorineural  hearing loss (SNHL) of left ear with unrestricted hearing of right ear 07/26/2021   Tinnitus of left ear 07/26/2021   Acute recurrent maxillary sinusitis 06/22/2021   Primary hypertension 01/12/2021   Hyperlipidemia associated with type 2 diabetes mellitus ([place]) 01/12/2021   Arthritis 01/12/2021   Type II diabetes mellitus ([place]) 11/25/2019   Ingrown toenail 09/05/2018   Neck pain 01/29/2018   Tick bite 10/24/2017    PCP: Jodi Mourning  REFERRING PROVIDER: Genia Harold  REFERRING DIAG: M54.2  THERAPY DIAG:  Cervicalgia  Chronic intractable headache, unspecified headache type  Muscle weakness (generalized)  Rationale for Evaluation and Treatment Rehabilitation  ONSET DATE: 11/08/21  SUBJECTIVE:  SUBJECTIVE STATEMENT: Still trying to find good shoes.  Reports trying to do more intenitonal walking PERTINENT HISTORY:  Arthritis, HTN, bilateral rotator cuff surgery 15-86yrs ago, DM  PAIN:  Are you having pain? Yes: NPRS scale: 06/10 Pain location: head and neck, shoulders  Pain description: tight  Aggravating factors: moving Relieving factors: massage, standing in the shower  WEIGHT BEARING RESTRICTIONS No  FALLS:  Has patient fallen in last 6 months? No  LIVING ENVIRONMENT: Lives with: lives alone Lives in: House/apartment Stairs: Yes: External: 3 steps; can reach both Has following equipment at home: None  OCCUPATION: reitred  PLOF: Independent  PATIENT GOALS to loosen it up, find out what is causing my headaches   OBJECTIVE:   DIAGNOSTIC FINDINGS:   FINDINGS: Brain: No acute infarct, mass effect or extra-axial collection. No acute or chronic hemorrhage. Normal white matter signal, parenchymal volume and CSF spaces. The midline structures are normal.  Skull  and upper cervical spine: Normal calvarium and skull base. Visualized upper cervical spine and soft tissues are normal.   Sinuses/Orbits:No paranasal sinus fluid levels or advanced mucosal thickening. No mastoid or middle ear effusion. Normal orbits.   IMPRESSION: Normal brain MRI.  PATIENT SURVEYS:  FOTO 53/100 ,  April 08, 2022 has been up to 58 and today was 42 but has been doing a lot and lifting and is back doing much more than she was.  January 4,2024 57/100 she does report tha tshe wnet backwards from her previous due to her being able to do so much more but is hurting more   COGNITION: Overall cognitive status: Within functional limits for tasks assessed  SENSATION: WFL  POSTURE: rounded shoulders, forward head, and increased thoracic kyphosis  PALPATION: TTP and sore along upper trap and cervical paraspinals, trigger points in R and L UT   CERVICAL ROM:   Active ROM A/PROM (deg) eval 12/30/21 01/05/22 9/28 10/31 04/05/22  Flexion 100% no pain WFL WFL WNL   WNL  Extension Mild limitations w/pain [facility]  WFL WNL   WNL  Right lateral flexion Mod limitation w/pain Mild limitations Mild limitations Severe limitation, leans with trunk  Decreased 75%  Decreased 50%  Left lateral flexion Mod limitation w/pain Mod limitations Mild limitations Severe limitation, leans with trunk  Decreased 75% Decreased  50%  Right rotation 75% w/pain Mild limitation w/pain WFL pain at end range  Mild limitation  Decreased 25%  Decreased 25%  Left rotation 75% w/pain  Mild limitation w/pain  WFL pain at end range  Mild limitation  Decreased 25% Decreased  25%   (Blank rows = not tested)  UPPER EXTREMITY ROM: WFL, limited in IR and ER due to prior RC surgery years ago   UPPER EXTREMITY MMT:  MMT Right eval Left eval Right 01/05/22 Left 01/05/22 R 9/28 L 9/28  Shoulder flexion 4+ 4+ 5 5 5 5   Shoulder extension        Shoulder abduction 4+ very painful 4+ 4+ with pain 5 5 5   Shoulder adduction         Shoulder extension        Shoulder internal rotation 4+ 4+ 5 5 5 5   Shoulder external rotation     5 5  Middle trapezius        Lower trapezius        Elbow flexion 5 5      Elbow extension 5 5      Wrist flexion  Wrist extension        Wrist ulnar deviation        Wrist radial deviation        Wrist pronation        Wrist supination        Grip strength         (Blank rows = not tested) 05/05/22 LE MMT  right 4+/5, left 4/5  SLR 40 degrees with pain in the legs and the back  Cannot cross her legs due to tight piriformis 25 degrees hip ER  Abdominal strength 2/5 unable to sit up or hold at 40 degrees  5x STS 21 seconds 06/14/22 TUG 10 seconds  5XSTS 16 seconds  Right 4+/5, left 4+/5  Manually I can cross her legs fully, she reports able to get   Socks on now, she can on her own without hands get her  Heels to mid patella  SLR to 55 degrees  Can hold plank 10 seconds elbows and toes  Had her A1C go down from 8.2 to 7.8   07/14/22  can hold plank 20 seconds x4  TUG 7 seconds  TODAY'S TREATMENT:  07/19/22 Weight 203# Gait around the back building brisk pace, one short rest 60# leg press 2x15 35# straight arm pulls  35# triceps 5# hips 3 ways Tmill push and pulls 20s x 3 each 3# overhead stick carry Passive HS and piriformis stretch  07/14/22 Weight 204# Fast walk around the back building 10# Hip extension and abduction 15# AR press 15# straight arm pulls Leg press 60# 2x15 Planks 4x20 seconds On bosu balance 3# alternating work  07/12/22 Weight 207# Brisk walk around the back building without rest and able to carry on a conversation 35# leg curls 10# leg extension Passive stretch LE's Education in calories in and out, carbs, protein and fiber 25# rows 2x15 Lats 25# 2x15 Elliptical 3 minutes level 4  07/06/22 Gait on lap brisk pace around the back building Elliptical level 4 x 2 minutes On bosu two ways with 3# hand weights various alternating  motions to challenge her balance 15# AR press 35# straight arm pull with the bicep bar Passive stretch to the LE's really focus on low load long duration to gain ROM  07/05/22 Walk around the back building fast pace Tmill push fwd and backward 20s x 3 each Seated row 25# 2x15 Lats 25# 2x15 10# straight arm pulls 10# hip flexion, abduction and extension Elliptical 2 minutes On bosu with 3# biceps, overhead press, punches 2 ways 10# sit to stands from low surface  06/30/22 Walk around the back building larger lap brisk pace Elliptical 2 minutes level 4 10# straight arm pulls 10# hip abduction and extension 35# leg curls 3x10 10# leg extension 3x10 Bosu balance standing head turns and reaching Leg press 60# 3x10 Passive stretch HS and piriformis  06/28/22 Walk outside at a brisk pace Leg curls 25# 3x10 Leg press 60# 3x10, 20# SL 2x10 Went over fit bit and how to read and examine further her HR and her activity status Bosu ball balance Passive HS and piriformis stretches Feet on ball bridges Planks 20 seconds 3x Hips abduction and extension 10# 2x10 each   PATIENT EDUCATION:  Education details: POC Person educated: Patient Education method: Explanation Education comprehension: verbalized understanding   HOME EXERCISE PROGRAM: Access Code: [email or web address] URL: [email or web address] Date: 12/01/2021 Prepared by: Andris Baumann   ASSESSMENT:  CLINICAL IMPRESSION: Reports again she is thinking more about her food  choices and her activity level and trying to do better.  She is looking to be more flexible to make getting dressed easier, I feel that this is improving but still very tight and limited REHAB POTENTIAL: Good  CLINICAL DECISION MAKING: Stable/uncomplicated  EVALUATION COMPLEXITY: Low   GOALS: Goals reviewed with patient? Yes  SHORT TERM GOALS: Target date: 12/29/21  Patient will be independent with initial HEP.  Goal status: MET  LONG TERM GOALS:  Target date: 02/26/22  Patient will be independent with advanced/ongoing HEP to improve outcomes and carryover.  Goal status: IN PROGRESS  2.  Patient will report 75% improvement in neck pain to improve QOL.   Baseline: 9/28- not even 50% improvement maybe 40% Goal status: met  3.  Patient will demonstrate full pain free cervical ROM to complete ADLs and play with grandchildren with more ease. Goal status: met 4.  Patient will demonstrate improved posture to decrease muscle imbalance and tightness.  Baseline: increased tightness along cervical paraspinals Goal status: progressing still tight piriformis  5.  Patient will report 74 on FOTO to demonstrate improved functional ability.  Baseline:  9/28- 64  Goal status: MET but has regressed as she is doing more and more  6.  Will demonstrate improved core strength as evidenced by passing double leg lower test  Baseline:  Goal status: progressing 7.  Average 11,000 steps a day on her fit bit over a month period   PLAN: PT FREQUENCY: 2x/week  PT DURATION: 4 weeks  PLANNED INTERVENTIONS: Therapeutic exercises, Therapeutic activity, Neuromuscular re-education, Balance training, Gait training, Patient/Family education, Self Care, Joint mobilization, Dry Needling, Spinal manipulation, Spinal mobilization, Cryotherapy, Moist heat, Taping, Vasopneumatic device, Traction, Ionotophoresis 4mg /ml Dexamethasone, and Manual therapy  PLAN FOR NEXT SESSION:  continue to push to maximize her overall function and quality of life Lum Babe, PT 07/19/2022, 8:40 AM   [facility] [place]. [place], [place], [address] Phone: [phone]   Fax:  [phone]

## 2022-07-21 ENCOUNTER — Ambulatory Visit: Payer: Medicare Other | Admitting: Physical Therapy

## 2022-07-21 ENCOUNTER — Encounter: Payer: Self-pay | Admitting: Physical Therapy

## 2022-07-21 DIAGNOSIS — M542 Cervicalgia: Secondary | ICD-10-CM

## 2022-07-21 DIAGNOSIS — G8929 Other chronic pain: Secondary | ICD-10-CM | POA: Diagnosis not present

## 2022-07-21 DIAGNOSIS — M6281 Muscle weakness (generalized): Secondary | ICD-10-CM

## 2022-07-21 DIAGNOSIS — R519 Headache, unspecified: Secondary | ICD-10-CM | POA: Diagnosis not present

## 2022-07-21 NOTE — Therapy (Signed)
OUTPATIENT PHYSICAL THERAPY CERVICAL TREATMENT    Patient Name: Veronica Galvan MRN: [id number] DOB:11/23/1955, 67 y.o., female Today's Date: 07/21/2022  PT End of Session - 07/21/22 1626     Visit Number 89    Date for PT Re-Evaluation 08/14/22    PT Start Time 1614    PT Stop Time 1700    PT Time Calculation (min) 46 min    Activity Tolerance Patient tolerated treatment well    Behavior During Therapy WFL for tasks assessed/performed               Past Medical History:  Diagnosis Date   Allergy    Anxiety    Arthritis    hands   Cataract    Diabetes mellitus without complication ([place])    type 2   Family history of adverse reaction to anesthesia    son has malignant hyperthermia, daughter does not daughter recently had c section without problems   GERD (gastroesophageal reflux disease)    Headache    sinus   Hyperlipidemia    Hypertension    PONV (postoperative nausea and vomiting)    nausea only   Ulcer, stomach peptic yrs ago   Past Surgical History:  Procedure Laterality Date   APPENDECTOMY     both hells bone spur repair     both heels with metal clips   both shoulder rotator cuff repair     CESAREAN SECTION     x 1   COLONOSCOPY WITH PROPOFOL N/A 09/07/2016   Procedure: COLONOSCOPY WITH PROPOFOL;  Surgeon: Garlan Fair, MD;  Location: [facility];  Service: Endoscopy;  Laterality: N/A;   colonscopy  06/2011   polyps   EYE SURGERY     FRACTURE SURGERY     TUBAL LIGATION     VESICO-VAGINAL FISTULA REPAIR     Patient Active Problem List   Diagnosis Date Noted   Gastroesophageal reflux disease 11/16/2021   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 08/18/2021   Dermatofibroma 08/18/2021   History of malignant neoplasm of skin 08/18/2021   Lentigo 08/18/2021   Melanocytic nevi of trunk 08/18/2021   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   Rosacea 08/18/2021   Actinic keratosis 08/18/2021   Sensorineural  hearing loss (SNHL) of left ear with unrestricted hearing of right ear 07/26/2021   Tinnitus of left ear 07/26/2021   Acute recurrent maxillary sinusitis 06/22/2021   Primary hypertension 01/12/2021   Hyperlipidemia associated with type 2 diabetes mellitus ([place]) 01/12/2021   Arthritis 01/12/2021   Type II diabetes mellitus ([place]) 11/25/2019   Ingrown toenail 09/05/2018   Neck pain 01/29/2018   Tick bite 10/24/2017    PCP: Jodi Mourning  REFERRING PROVIDER: Genia Harold  REFERRING DIAG: M54.2  THERAPY DIAG:  Cervicalgia  Chronic intractable headache, unspecified headache type  Muscle weakness (generalized)  Rationale for Evaluation and Treatment Rehabilitation  ONSET DATE: 11/08/21  SUBJECTIVE:  SUBJECTIVE STATEMENT: Doing okay, I have been very busy, I will be traveling next week. Still reports HA daily PERTINENT HISTORY:  Arthritis, HTN, bilateral rotator cuff surgery 15-51yrs ago, DM  PAIN:  Are you having pain? Yes: NPRS scale: 06/10 Pain location: head and neck, shoulders  Pain description: tight  Aggravating factors: moving Relieving factors: massage, standing in the shower  WEIGHT BEARING RESTRICTIONS No  FALLS:  Has patient fallen in last 6 months? No  LIVING ENVIRONMENT: Lives with: lives alone Lives in: House/apartment Stairs: Yes: External: 3 steps; can reach both Has following equipment at home: None  OCCUPATION: reitred  PLOF: Independent  PATIENT GOALS to loosen it up, find out what is causing my headaches   OBJECTIVE:   DIAGNOSTIC FINDINGS:   FINDINGS: Brain: No acute infarct, mass effect or extra-axial collection. No acute or chronic hemorrhage. Normal white matter signal, parenchymal volume and CSF spaces. The midline structures are  normal.  Skull and upper cervical spine: Normal calvarium and skull base. Visualized upper cervical spine and soft tissues are normal.   Sinuses/Orbits:No paranasal sinus fluid levels or advanced mucosal thickening. No mastoid or middle ear effusion. Normal orbits.   IMPRESSION: Normal brain MRI.  PATIENT SURVEYS:  FOTO 53/100 ,  April 08, 2022 has been up to 78 and today was 62 but has been doing a lot and lifting and is back doing much more than she was.  January 4,2024 57/100 she does report tha tshe wnet backwards from her previous due to her being able to do so much more but is hurting more   COGNITION: Overall cognitive status: Within functional limits for tasks assessed  SENSATION: WFL  POSTURE: rounded shoulders, forward head, and increased thoracic kyphosis  PALPATION: TTP and sore along upper trap and cervical paraspinals, trigger points in R and L UT   CERVICAL ROM:   Active ROM A/PROM (deg) eval 12/30/21 01/05/22 9/28 10/31 04/05/22  Flexion 100% no pain WFL WFL WNL   WNL  Extension Mild limitations w/pain [facility]  WFL WNL   WNL  Right lateral flexion Mod limitation w/pain Mild limitations Mild limitations Severe limitation, leans with trunk  Decreased 75%  Decreased 50%  Left lateral flexion Mod limitation w/pain Mod limitations Mild limitations Severe limitation, leans with trunk  Decreased 75% Decreased  50%  Right rotation 75% w/pain Mild limitation w/pain WFL pain at end range  Mild limitation  Decreased 25%  Decreased 25%  Left rotation 75% w/pain  Mild limitation w/pain  WFL pain at end range  Mild limitation  Decreased 25% Decreased  25%   (Blank rows = not tested)  UPPER EXTREMITY ROM: WFL, limited in IR and ER due to prior RC surgery years ago   UPPER EXTREMITY MMT:  MMT Right eval Left eval Right 01/05/22 Left 01/05/22 R 9/28 L 9/28  Shoulder flexion 4+ 4+ 5 5 5 5   Shoulder extension        Shoulder abduction 4+ very painful 4+ 4+ with pain 5 5 5    Shoulder adduction        Shoulder extension        Shoulder internal rotation 4+ 4+ 5 5 5 5   Shoulder external rotation     5 5  Middle trapezius        Lower trapezius        Elbow flexion 5 5      Elbow extension 5 5      Wrist flexion  Wrist extension        Wrist ulnar deviation        Wrist radial deviation        Wrist pronation        Wrist supination        Grip strength         (Blank rows = not tested) 05/05/22 LE MMT  right 4+/5, left 4/5  SLR 40 degrees with pain in the legs and the back  Cannot cross her legs due to tight piriformis 25 degrees hip ER  Abdominal strength 2/5 unable to sit up or hold at 40 degrees  5x STS 21 seconds 06/14/22 TUG 10 seconds  5XSTS 16 seconds  Right 4+/5, left 4+/5  Manually I can cross her legs fully, she reports able to get   Socks on now, she can on her own without hands get her  Heels to mid patella  SLR to 55 degrees  Can hold plank 10 seconds elbows and toes  Had her A1C go down from 8.2 to 7.8   07/14/22  can hold plank 20 seconds x4  TUG 7 seconds  TODAY'S TREATMENT:  07/21/22 Fast walk around the building no rest 10# hip extension, abduction and flexion Sit to stands 10# overhead press on sit fit  10# straight arm pulls 15# AR press 25# rows Black tband extension and abs HS, calf and piriformis stretch  07/19/22 Weight 203# Gait around the back building brisk pace, one short rest 60# leg press 2x15 35# straight arm pulls  35# triceps 5# hips 3 ways Tmill push and pulls 20s x 3 each 3# overhead stick carry Passive HS and piriformis stretch  07/14/22 Weight 204# Fast walk around the back building 10# Hip extension and abduction 15# AR press 15# straight arm pulls Leg press 60# 2x15 Planks 4x20 seconds On bosu balance 3# alternating work  07/12/22 Weight 207# Brisk walk around the back building without rest and able to carry on a conversation 35# leg curls 10# leg extension Passive stretch  LE's Education in calories in and out, carbs, protein and fiber 25# rows 2x15 Lats 25# 2x15 Elliptical 3 minutes level 4  07/06/22 Gait on lap brisk pace around the back building Elliptical level 4 x 2 minutes On bosu two ways with 3# hand weights various alternating motions to challenge her balance 15# AR press 35# straight arm pull with the bicep bar Passive stretch to the LE's really focus on low load long duration to gain ROM  07/05/22 Walk around the back building fast pace Tmill push fwd and backward 20s x 3 each Seated row 25# 2x15 Lats 25# 2x15 10# straight arm pulls 10# hip flexion, abduction and extension Elliptical 2 minutes On bosu with 3# biceps, overhead press, punches 2 ways 10# sit to stands from low surface  06/30/22 Walk around the back building larger lap brisk pace Elliptical 2 minutes level 4 10# straight arm pulls 10# hip abduction and extension 35# leg curls 3x10 10# leg extension 3x10 Bosu balance standing head turns and reaching Leg press 60# 3x10 Passive stretch HS and piriformis  06/28/22 Walk outside at a brisk pace Leg curls 25# 3x10 Leg press 60# 3x10, 20# SL 2x10 Went over fit bit and how to read and examine further her HR and her activity status Bosu ball balance Passive HS and piriformis stretches Feet on ball bridges Planks 20 seconds 3x Hips abduction and extension 10# 2x10 each   PATIENT EDUCATION:  Education details: POC Person educated: Patient Education method: Explanation Education comprehension: verbalized understanding   HOME EXERCISE PROGRAM: Access Code: [email or web address] URL: [email or web address] Date: 12/01/2021 Prepared by: Andris Baumann   ASSESSMENT:  CLINICAL IMPRESSION: We went a little easier today as she will be leaving this weekend for a 10 day trip, she will have to do some long days on her feet, will have to see how she does with this, today she is again much more flexible than she was and moves much  easier REHAB POTENTIAL: Good  CLINICAL DECISION MAKING: Stable/uncomplicated  EVALUATION COMPLEXITY: Low   GOALS: Goals reviewed with patient? Yes  SHORT TERM GOALS: Target date: 12/29/21  Patient will be independent with initial HEP.  Goal status: MET  LONG TERM GOALS: Target date: 02/26/22  Patient will be independent with advanced/ongoing HEP to improve outcomes and carryover.  Goal status: IN PROGRESS  2.  Patient will report 75% improvement in neck pain to improve QOL.   Baseline: 9/28- not even 50% improvement maybe 40% Goal status: met  3.  Patient will demonstrate full pain free cervical ROM to complete ADLs and play with grandchildren with more ease. Goal status: met 4.  Patient will demonstrate improved posture to decrease muscle imbalance and tightness.  Baseline: increased tightness along cervical paraspinals Goal status: progressing still tight piriformis  5.  Patient will report 1 on FOTO to demonstrate improved functional ability.  Baseline:  9/28- 64  Goal status: MET but has regressed as she is doing more and more  6.  Will demonstrate improved core strength as evidenced by passing double leg lower test  Baseline:  Goal status: progressing 7.  Average 11,000 steps a day on her fit bit over a month period   PLAN: PT FREQUENCY: 2x/week  PT DURATION: 4 weeks  PLANNED INTERVENTIONS: Therapeutic exercises, Therapeutic activity, Neuromuscular re-education, Balance training, Gait training, Patient/Family education, Self Care, Joint mobilization, Dry Needling, Spinal manipulation, Spinal mobilization, Cryotherapy, Moist heat, Taping, Vasopneumatic device, Traction, Ionotophoresis 4mg /ml Dexamethasone, and Manual therapy  PLAN FOR NEXT SESSION:  see how she is when she returns from her vacation Lum Babe, PT 07/21/2022, 4:27 PM   [facility] [place]. [place], [place], [address] Phone:  [phone]   Fax:  [phone]

## 2022-07-22 ENCOUNTER — Encounter: Payer: Self-pay | Admitting: Family

## 2022-07-22 ENCOUNTER — Telehealth (INDEPENDENT_AMBULATORY_CARE_PROVIDER_SITE_OTHER): Payer: Medicare Other | Admitting: Family

## 2022-07-22 DIAGNOSIS — L304 Erythema intertrigo: Secondary | ICD-10-CM

## 2022-07-22 MED ORDER — NYSTATIN-TRIAMCINOLONE 100000-0.1 UNIT/GM-% EX OINT: 1.0000 | TOPICAL_OINTMENT | Freq: Two times a day (BID) | CUTANEOUS | 0 refills | Status: DC

## 2022-07-22 MED ORDER — FLUCONAZOLE 100 MG PO TABS: 100.0000 mg | ORAL_TABLET | Freq: Every day | ORAL | 0 refills | Status: DC

## 2022-07-22 NOTE — Progress Notes (Signed)
Veronica Galvan is a 67 y.o. 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   Rosacea 08/18/2021   Actinic keratosis 08/18/2021   Sensorineural hearing loss (SNHL) of left ear with unrestricted hearing of right ear 07/26/2021   Tinnitus of left ear 07/26/2021   Acute recurrent maxillary sinusitis 06/22/2021   Primary hypertension 01/12/2021   Hyperlipidemia associated with type 2 diabetes mellitus ([place]) 01/12/2021   Arthritis 01/12/2021   Type II diabetes mellitus ([place]) 11/25/2019   Ingrown toenail 09/05/2018   Neck pain 01/29/2018   Tick bite 10/24/2017    Current Outpatient Medications  Medication Sig Dispense Refill   ALPRAZolam (XANAX) 0.5 MG tablet Take 1 tablet (0.5 mg total) by mouth at bedtime as needed for anxiety. 30 tablet 0   Ascorbic Acid (VITAMIN C) 1000 MG tablet Take 1,000 mg by mouth every morning.     aspirin EC 81 MG tablet Take 81 mg by mouth every morning.     atorvastatin (LIPITOR) 40 MG tablet Take 1 tablet (40 mg total) by mouth every evening. 90 tablet 3   Calcium Carb-Cholecalciferol (CALCIUM 600+D3 PO) Take 1 tablet by mouth every morning.     celecoxib (CELEBREX) 200 MG capsule Take 1 capsule (200 mg total) by mouth daily. 90 capsule 3   Cholecalciferol (VITAMIN D) 2000 units tablet Take 2,000 Units by mouth daily.     Coenzyme Q10 300 MG CAPS Take 1 capsule by mouth every morning.     diclofenac Sodium (VOLTAREN) 1 % GEL Apply topically 4 (four) times daily.     glucose blood test strip Use as instructed 100 each 12   levocetirizine (XYZAL) 5 MG tablet Take 1 tablet (5 mg total) by  mouth every evening. 90 tablet 3   lisinopril (ZESTRIL) 5 MG tablet Take 1 tablet (5 mg total) by mouth 2 (two) times daily. 180 tablet 3   LUTEIN-ZEAXANTHIN PO Take 1 tablet by mouth every morning.     Multiple Vitamins-Minerals (MULTIVITAMIN WITH MINERALS) tablet Take 1 tablet by mouth every morning.     nystatin-triamcinolone ointment (MYCOLOG) Apply 1 Application topically 2 (two) times daily. 60 g 0   Omega-3 Fatty Acids (EQL OMEGA 3 FISH OIL) 1400 MG CAPS Take 2 capsules by mouth daily.     omeprazole (PRILOSEC) 20 MG capsule Take 1 capsule (20 mg total) by mouth every morning. 90 capsule 3   Semaglutide, 2 MG/DOSE, 8 MG/3ML SOPN Inject 2 mg as directed once a week. 3 mL 0   SYNJARDY XR 12.08-998 MG TB24 Take 1 tablet by mouth 2 (two) times daily. 180 tablet 3   UNABLE TO FIND Med Name: Cinnamon/Cinsilin     VITAMIN E PO Take by mouth.     fluconazole (DIFLUCAN) 100 MG tablet Take 1 tablet (100 mg total) by mouth daily. 5 tablet 0   No current facility-administered medications for this visit.    Allergies: Codeine, Benzonatate, and Epinephrine  Past Medical History:  Diagnosis Date   Allergy    Anxiety    Arthritis    hands   Cataract    Diabetes mellitus without complication ([place])    type  2   Family history of adverse reaction to anesthesia    son has malignant hyperthermia, daughter does not daughter recently had c section without problems   GERD (gastroesophageal reflux disease)    Headache    sinus   Hyperlipidemia    Hypertension    PONV (postoperative nausea and vomiting)    nausea only   Ulcer, stomach peptic yrs ago    Past Surgical History:  Procedure Laterality Date   APPENDECTOMY     both hells bone spur repair     both heels with metal clips   both shoulder rotator cuff repair     CESAREAN SECTION     x 1   COLONOSCOPY WITH PROPOFOL N/A 09/07/2016   Procedure: COLONOSCOPY WITH PROPOFOL;  Surgeon: Garlan Fair, MD;  Location: [facility];  Service:  Endoscopy;  Laterality: N/A;   colonscopy  06/2011   polyps   EYE SURGERY     FRACTURE SURGERY     TUBAL LIGATION     VESICO-VAGINAL FISTULA REPAIR      Family History  Problem Relation Age of Onset   Hypertension Mother    COPD Mother    Cancer Mother    Arthritis Mother    Hypertension Father    Hyperlipidemia Father    Diabetes Father    Cancer Father    Alcohol abuse Father    Diabetes Brother    Alcohol abuse Brother    Hypertension Brother    Arthritis Maternal Grandmother    Birth defects Maternal Grandmother    Diabetes Paternal Grandmother    Heart disease Paternal Grandfather    Diabetes Paternal Aunt    Diabetes Paternal Aunt    Obesity Son     Social History   Tobacco Use   Smoking status: Former    Packs/day: 1.00    Years: 14.00    Additional pack years: 0.00    Total pack years: 14.00    Types: Cigarettes   Smokeless tobacco: Never   Tobacco comments:    quit 31 yrs ago  Substance Use Topics   Alcohol use: Yes    Comment: rare    Subjective:   I connected with Veronica Galvan on 07/22/22 at 10:00 AM EDT by a video enabled telemedicine application and verified that I am speaking with the correct person using two identifiers.   I discussed the limitations of evaluation and management by telemedicine and the availability of in person appointments. The patient expressed understanding and agreed to proceed. Provider in office/ patient is at home; provider and patient are only 2 people on video call.   Patient had called earlier this week with concerns for worsening yeast infection- note to be in both inner thighs/ under breast; is leaving for [facility] on Sunday; does feel that symptoms most likely due to increased exercise/ time in the pool; blood sugars have been very normal recently; was started on Diflucan and asked to check in today after being on medication to assess response;   She is on day 3 of Diflucan and noticing some improvement but  concerned about being in [place]/ increased pool time at resort;   Objective:  There were no vitals filed for this visit.  General: Well developed, well nourished, in no acute distress  Skin : Warm and dry.  Head: Normocephalic and atraumatic  Lungs: Respirations unlabored;  Neurologic: Alert and oriented; speech intact; face symmetrical; moves all extremities well; CNII-XII intact without focal deficit  Assessment:  1. Intertrigo     Plan:  Extend Diflucan for total of 7-10 day treatment time; Rx for Mycolog II to apply bid to affected area; follow up worse, no better.   No follow-ups on file.  No orders of the defined types were placed in this encounter.   Requested Prescriptions   Signed Prescriptions Disp Refills   fluconazole (DIFLUCAN) 100 MG tablet 5 tablet 0    Sig: Take 1 tablet (100 mg total) by mouth daily.   nystatin-triamcinolone ointment (MYCOLOG) 60 g 0    Sig: Apply 1 Application topically 2 (two) times daily.

## 2022-08-02 ENCOUNTER — Encounter: Payer: Self-pay | Admitting: Family

## 2022-08-05 ENCOUNTER — Encounter: Payer: Self-pay | Admitting: Family

## 2022-08-05 ENCOUNTER — Ambulatory Visit (INDEPENDENT_AMBULATORY_CARE_PROVIDER_SITE_OTHER): Payer: Medicare Other | Admitting: Family

## 2022-08-05 VITALS — BP 150/80 | HR 94 | Temp 98.0°F | Resp 18 | Ht 61.0 in | Wt 202.2 lb

## 2022-08-05 DIAGNOSIS — J029 Acute pharyngitis, unspecified: Secondary | ICD-10-CM | POA: Diagnosis not present

## 2022-08-05 LAB — POCT RAPID STREP A (OFFICE): Rapid Strep A Screen: POSITIVE — AB

## 2022-08-05 MED ORDER — AZITHROMYCIN 250 MG PO TABS: ORAL_TABLET | ORAL | 0 refills | Status: DC

## 2022-08-05 NOTE — Progress Notes (Signed)
Veronica Galvan is a 67 y.o. 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5 mg total) by mouth at bedtime as needed for anxiety. 30 tablet 0   Ascorbic Acid (VITAMIN C) 1000 MG tablet Take 1,000 mg by mouth every morning.     aspirin EC 81 MG tablet Take 81 mg by mouth every morning.     atorvastatin (LIPITOR) 40 MG tablet Take 1 tablet (40 mg total) by mouth every evening. 90 tablet 3   azithromycin (ZITHROMAX Z-PAK) 250 MG tablet Take 2 tablets (500 mg) PO today, then 1 tablet (250 mg) PO daily x4 days. 6 tablet 0   Calcium Carb-Cholecalciferol (CALCIUM 600+D3 PO) Take 1 tablet by mouth every morning.     celecoxib (CELEBREX) 200 MG capsule Take 1 capsule (200 mg total) by mouth daily. 90 capsule 3   Cholecalciferol (VITAMIN D) 2000 units tablet Take 2,000 Units by mouth daily.     Coenzyme Q10 300 MG CAPS Take 1 capsule by mouth every morning.     fluconazole (DIFLUCAN) 100 MG tablet Take 1 tablet (100 mg total) by mouth daily.  5 tablet 0   glucose blood test strip Use as instructed 100 each 12   levocetirizine (XYZAL) 5 MG tablet Take 1 tablet (5 mg total) by mouth every evening. 90 tablet 3   lisinopril (ZESTRIL) 5 MG tablet Take 1 tablet (5 mg total) by mouth 2 (two) times daily. 180 tablet 3   LUTEIN-ZEAXANTHIN PO Take 1 tablet by mouth every morning.     Multiple Vitamins-Minerals (MULTIVITAMIN WITH MINERALS) tablet Take 1 tablet by mouth every morning.     nystatin-triamcinolone ointment (MYCOLOG) Apply 1 Application topically 2 (two) times daily. 60 g 0   Omega-3 Fatty Acids (EQL OMEGA 3 FISH OIL) 1400 MG CAPS Take 2 capsules by mouth daily.     omeprazole (PRILOSEC) 20 MG capsule Take 1 capsule (20 mg total) by mouth every morning. 90 capsule 3   Semaglutide, 2 MG/DOSE, 8 MG/3ML SOPN Inject 2 mg as directed once a week. 3 mL 0   SYNJARDY XR 12.08-998 MG TB24 Take 1 tablet by mouth 2 (two) times daily. 180 tablet 3   UNABLE TO FIND Med Name: Cinnamon/Cinsilin     VITAMIN E PO Take by mouth.     diclofenac Sodium (VOLTAREN) 1 % GEL Apply topically 4 (four) times daily. (Patient not taking: Reported on 08/05/2022)     No current facility-administered medications for this visit.    Allergies: Codeine, Benzonatate, and Epinephrine  Past Medical History:  Diagnosis  Date   Allergy    Anxiety    Arthritis    hands   Cataract    Diabetes mellitus without complication    type 2   Family history of adverse reaction to anesthesia    son has malignant hyperthermia, daughter does not daughter recently had c section without problems   GERD (gastroesophageal reflux disease)    Headache    sinus   Hyperlipidemia    Hypertension    PONV (postoperative nausea and vomiting)    nausea only   Ulcer, stomach peptic yrs ago    Past Surgical History:  Procedure Laterality Date   APPENDECTOMY     both hells bone spur repair     both heels with metal clips   both shoulder rotator cuff repair     CESAREAN SECTION      x 1   COLONOSCOPY WITH PROPOFOL N/A 09/07/2016   Procedure: COLONOSCOPY WITH PROPOFOL;  Surgeon: Charolett Bumpers, MD;  Location: [facility];  Service: Endoscopy;  Laterality: N/A;   colonscopy  06/2011   polyps   EYE SURGERY     FRACTURE SURGERY     TUBAL LIGATION     VESICO-VAGINAL FISTULA REPAIR      Family History  Problem Relation Age of Onset   Hypertension Mother    COPD Mother    Cancer Mother    Arthritis Mother    Hypertension Father    Hyperlipidemia Father    Diabetes Father    Cancer Father    Alcohol abuse Father    Diabetes Brother    Alcohol abuse Brother    Hypertension Brother    Arthritis Maternal Grandmother    Birth defects Maternal Grandmother    Diabetes Paternal Grandmother    Heart disease Paternal Grandfather    Diabetes Paternal Aunt    Diabetes Paternal Aunt    Obesity Son     Social History   Tobacco Use   Smoking status: Former    Packs/day: 1.00    Years: 14.00    Additional pack years: 0.00    Total pack years: 14.00    Types: Cigarettes   Smokeless tobacco: Never   Tobacco comments:    quit 31 yrs ago  Substance Use Topics   Alcohol use: Yes    Comment: rare    Subjective:   Concerned for strep throat; was on vacation with her grandchildren last week in [place]; "feels like when I have had strep in the past." Sore throat x 1 week;    Objective:  Vitals:   08/05/22 1120 08/05/22 1200  BP: (!) 156/100 (!) 150/80  Pulse: 94   Resp: 18   Temp: 98 F (36.7 C)   TempSrc: Oral   SpO2: 99%   Weight: 202 lb 3.2 oz (91.7 kg)   Height: 5\' 1"  (1.549 m)     General: Well developed, well nourished, in no acute distress  Skin : Warm and dry.  Head: Normocephalic and atraumatic  Eyes: Sclera and conjunctiva clear; pupils round and reactive to light; extraocular movements intact  Ears: External normal; canals clear; tympanic membranes normal  Oropharynx: Pink, supple. No suspicious lesions  Neck: Supple without thyromegaly,  adenopathy  Lungs: Respirations unlabored; clear to auscultation bilaterally without wheeze, rales, rhonchi  CVS exam: normal rate and regular rhythm.  Neurologic: Alert and oriented; speech intact; face symmetrical; moves all extremities well; CNII-XII intact without focal deficit   Assessment:  1. Sore throat  Plan:  Rapid strep is positive; patient has history of PCN allergy that has been cleared by allergy; she has not tried PCN since this and is concerned about possible mild reaction with upcoming events planned for weekend; agree this is reasonable- will treat with Zithromax which she has tolerated fine in the past;   No follow-ups on file.  Orders Placed This Encounter  Procedures   POCT rapid strep A    Requested Prescriptions   Signed Prescriptions Disp Refills   azithromycin (ZITHROMAX Z-PAK) 250 MG tablet 6 tablet 0    Sig: Take 2 tablets (500 mg) PO today, then 1 tablet (250 mg) PO daily x4 days.

## 2022-08-09 ENCOUNTER — Ambulatory Visit: Payer: Medicare Other | Attending: Family | Admitting: Physical Therapy

## 2022-08-09 ENCOUNTER — Encounter: Payer: Self-pay | Admitting: Physical Therapy

## 2022-08-09 DIAGNOSIS — G8929 Other chronic pain: Secondary | ICD-10-CM | POA: Insufficient documentation

## 2022-08-09 DIAGNOSIS — M542 Cervicalgia: Secondary | ICD-10-CM | POA: Insufficient documentation

## 2022-08-09 DIAGNOSIS — R519 Headache, unspecified: Secondary | ICD-10-CM | POA: Insufficient documentation

## 2022-08-09 DIAGNOSIS — M6281 Muscle weakness (generalized): Secondary | ICD-10-CM | POA: Insufficient documentation

## 2022-08-09 NOTE — Therapy (Signed)
OUTPATIENT PHYSICAL THERAPY CERVICAL TREATMENT    Patient Name: Veronica Galvan MRN: [id number] DOB:12/16/1955, 67 y.o., female Today's Date: 08/09/2022  PT End of Session - 08/09/22 1011     Visit Number 64    Date for PT Re-Evaluation 08/14/22    PT Start Time 1011    PT Stop Time 1100    PT Time Calculation (min) 49 min    Activity Tolerance Patient tolerated treatment well    Behavior During Therapy WFL for tasks assessed/performed               Past Medical History:  Diagnosis Date   Allergy    Anxiety    Arthritis    hands   Cataract    Diabetes mellitus without complication    type 2   Family history of adverse reaction to anesthesia    son has malignant hyperthermia, daughter does not daughter recently had c section without problems   GERD (gastroesophageal reflux disease)    Headache    sinus   Hyperlipidemia    Hypertension    PONV (postoperative nausea and vomiting)    nausea only   Ulcer, stomach peptic yrs ago   Past Surgical History:  Procedure Laterality Date   APPENDECTOMY     both hells bone spur repair     both heels with metal clips   both shoulder rotator cuff repair     CESAREAN SECTION     x 1   COLONOSCOPY WITH PROPOFOL N/A 09/07/2016   Procedure: COLONOSCOPY WITH PROPOFOL;  Surgeon: Charolett BumpersJohnson, Martin K, MD;  Location: [facility];  Service: Endoscopy;  Laterality: N/A;   colonscopy  06/2011   polyps   EYE SURGERY     FRACTURE SURGERY     TUBAL LIGATION     VESICO-VAGINAL FISTULA REPAIR     Patient Active Problem List   Diagnosis Date Noted   Gastroesophageal reflux disease 11/16/2021   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 08/18/2021   Dermatofibroma 08/18/2021   History of malignant neoplasm of skin 08/18/2021   Lentigo 08/18/2021   Melanocytic nevi of trunk 08/18/2021   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   Rosacea 08/18/2021   Actinic keratosis 08/18/2021   Sensorineural hearing loss  (SNHL) of left ear with unrestricted hearing of right ear 07/26/2021   Tinnitus of left ear 07/26/2021   Acute recurrent maxillary sinusitis 06/22/2021   Primary hypertension 01/12/2021   Hyperlipidemia associated with type 2 diabetes mellitus 01/12/2021   Arthritis 01/12/2021   Type II diabetes mellitus 11/25/2019   Ingrown toenail 09/05/2018   Neck pain 01/29/2018   Tick bite 10/24/2017    PCP: Ria ClockLaura Murray  REFERRING PROVIDER: Ocie DoyneJennifer Chima  REFERRING DIAG: M54.2  THERAPY DIAG:  Cervicalgia  Chronic intractable headache, unspecified headache type  Muscle weakness (generalized)  Rationale for Evaluation and Treatment Rehabilitation  ONSET DATE: 11/08/21  SUBJECTIVE:  SUBJECTIVE STATEMENT: Have been out for two weeks.  Reports that she did a vacation and did well the first week, did a lot of steps and was able to hang in with the kids, the next week got strep and has had a set back and has pain in the right buttock and right HS PERTINENT HISTORY:  Arthritis, HTN, bilateral rotator cuff surgery 15-8yrs ago, DM  PAIN:  Are you having pain? Yes: NPRS scale: 06/10 Pain location: head and neck, shoulders  Pain description: tight  Aggravating factors: moving Relieving factors: massage, standing in the shower  WEIGHT BEARING RESTRICTIONS No  FALLS:  Has patient fallen in last 6 months? No  LIVING ENVIRONMENT: Lives with: lives alone Lives in: House/apartment Stairs: Yes: External: 3 steps; can reach both Has following equipment at home: None  OCCUPATION: reitred  PLOF: Independent  PATIENT GOALS to loosen it up, find out what is causing my headaches   OBJECTIVE:   DIAGNOSTIC FINDINGS:   FINDINGS: Brain: No acute infarct, mass effect or extra-axial collection.  No acute or chronic hemorrhage. Normal white matter signal, parenchymal volume and CSF spaces. The midline structures are normal.  Skull and upper cervical spine: Normal calvarium and skull base. Visualized upper cervical spine and soft tissues are normal.   Sinuses/Orbits:No paranasal sinus fluid levels or advanced mucosal thickening. No mastoid or middle ear effusion. Normal orbits.   IMPRESSION: Normal brain MRI.  PATIENT SURVEYS:  FOTO 53/100 ,  April 08, 2022 has been up to 64 and today was 53 but has been doing a lot and lifting and is back doing much more than she was.  January 4,2024 57/100 she does report tha tshe wnet backwards from her previous due to her being able to do so much more but is hurting more   COGNITION: Overall cognitive status: Within functional limits for tasks assessed  SENSATION: WFL  POSTURE: rounded shoulders, forward head, and increased thoracic kyphosis  PALPATION: TTP and sore along upper trap and cervical paraspinals, trigger points in R and L UT   CERVICAL ROM:   Active ROM A/PROM (deg) eval 12/30/21 01/05/22 9/28 10/31 04/05/22  Flexion 100% no pain WFL WFL WNL   WNL  Extension Mild limitations w/pain [facility]  WFL WNL   WNL  Right lateral flexion Mod limitation w/pain Mild limitations Mild limitations Severe limitation, leans with trunk  Decreased 75%  Decreased 50%  Left lateral flexion Mod limitation w/pain Mod limitations Mild limitations Severe limitation, leans with trunk  Decreased 75% Decreased  50%  Right rotation 75% w/pain Mild limitation w/pain WFL pain at end range  Mild limitation  Decreased 25%  Decreased 25%  Left rotation 75% w/pain  Mild limitation w/pain  WFL pain at end range  Mild limitation  Decreased 25% Decreased  25%   (Blank rows = not tested)  UPPER EXTREMITY ROM: WFL, limited in IR and ER due to prior RC surgery years ago   UPPER EXTREMITY MMT:  MMT Right eval Left eval Right 01/05/22 Left 01/05/22 R 9/28 L 9/28   Shoulder flexion 4+ 4+ 5 5 5 5   Shoulder extension        Shoulder abduction 4+ very painful 4+ 4+ with pain 5 5 5   Shoulder adduction        Shoulder extension        Shoulder internal rotation 4+ 4+ 5 5 5 5   Shoulder external rotation     5 5  Middle trapezius  Lower trapezius        Elbow flexion 5 5      Elbow extension 5 5      Wrist flexion        Wrist extension        Wrist ulnar deviation        Wrist radial deviation        Wrist pronation        Wrist supination        Grip strength         (Blank rows = not tested) 05/05/22 LE MMT  right 4+/5, left 4/5  SLR 40 degrees with pain in the legs and the back  Cannot cross her legs due to tight piriformis 25 degrees hip ER  Abdominal strength 2/5 unable to sit up or hold at 40 degrees  5x STS 21 seconds 06/14/22 TUG 10 seconds  5XSTS 16 seconds  Right 4+/5, left 4+/5  Manually I can cross her legs fully, she reports able to get   Socks on now, she can on her own without hands get her  Heels to mid patella  SLR to 55 degrees  Can hold plank 10 seconds elbows and toes  Had her A1C go down from 8.2 to 7.8   07/14/22  can hold plank 20 seconds x4  TUG 7 seconds  TODAY'S TREATMENT:  08/09/22 Weight 202# 2 laps outside good pace one rest 15# straight arm pulls 20# AR press 10# hip extension abd, flexion 2x15 25# seated rows 25# lats 10# chest press 40# leg press Passive stretch to the LE's  07/21/22 Fast walk around the building no rest 10# hip extension, abduction and flexion Sit to stands 10# overhead press on sit fit  10# straight arm pulls 15# AR press 25# rows Black tband extension and abs HS, calf and piriformis stretch  07/19/22 Weight 203# Gait around the back building brisk pace, one short rest 60# leg press 2x15 35# straight arm pulls  35# triceps 5# hips 3 ways Tmill push and pulls 20s x 3 each 3# overhead stick carry Passive HS and piriformis stretch  07/14/22 Weight 204# Fast walk  around the back building 10# Hip extension and abduction 15# AR press 15# straight arm pulls Leg press 60# 2x15 Planks 4x20 seconds On bosu balance 3# alternating work  07/12/22 Weight 207# Brisk walk around the back building without rest and able to carry on a conversation 35# leg curls 10# leg extension Passive stretch LE's Education in calories in and out, carbs, protein and fiber 25# rows 2x15 Lats 25# 2x15 Elliptical 3 minutes level 4  07/06/22 Gait on lap brisk pace around the back building Elliptical level 4 x 2 minutes On bosu two ways with 3# hand weights various alternating motions to challenge her balance 15# AR press 35# straight arm pull with the bicep bar Passive stretch to the LE's really focus on low load long duration to gain ROM  07/05/22 Walk around the back building fast pace Tmill push fwd and backward 20s x 3 each Seated row 25# 2x15 Lats 25# 2x15 10# straight arm pulls 10# hip flexion, abduction and extension Elliptical 2 minutes On bosu with 3# biceps, overhead press, punches 2 ways 10# sit to stands from low surface  06/30/22 Walk around the back building larger lap brisk pace Elliptical 2 minutes level 4 10# straight arm pulls 10# hip abduction and extension 35# leg curls 3x10 10# leg extension 3x10 Bosu balance standing head  turns and reaching Leg press 60# 3x10 Passive stretch HS and piriformis  06/28/22 Walk outside at a brisk pace Leg curls 25# 3x10 Leg press 60# 3x10, 20# SL 2x10 Went over fit bit and how to read and examine further her HR and her activity status Bosu ball balance Passive HS and piriformis stretches Feet on ball bridges Planks 20 seconds 3x Hips abduction and extension 10# 2x10 each   PATIENT EDUCATION:  Education details: POC Person educated: Patient Education method: Explanation Education comprehension: verbalized understanding   HOME EXERCISE PROGRAM: Access Code: [email or web address] URL:  [email or web address] Date: 12/01/2021 Prepared by: Cassie Freer   ASSESSMENT:  CLINICAL IMPRESSION: Patient was out on vacation, she returns reporting that she feels like she did very well, reports that prior to doing PT she could not have done the trip.  She reports that she really is watching and thinking about what she is eating, she reports that last week she did hurt her right HS, pushing grandson on the swing, she was a little tight and sore here today.  She has continued to slowly lose some weight.  The HS and the piriformis remain very tight which restricts her ability to dress REHAB POTENTIAL: Good  CLINICAL DECISION MAKING: Stable/uncomplicated  EVALUATION COMPLEXITY: Low   GOALS: Goals reviewed with patient? Yes  SHORT TERM GOALS: Target date: 12/29/21  Patient will be independent with initial HEP.  Goal status: MET  LONG TERM GOALS: Target date: 02/26/22  Patient will be independent with advanced/ongoing HEP to improve outcomes and carryover.  Goal status: IN PROGRESS  2.  Patient will report 75% improvement in neck pain to improve QOL.   Baseline: 9/28- not even 50% improvement maybe 40% Goal status: met  3.  Patient will demonstrate full pain free cervical ROM to complete ADLs and play with grandchildren with more ease. Goal status: met 4.  Patient will demonstrate improved posture to decrease muscle imbalance and tightness.  Baseline: increased tightness along cervical paraspinals Goal status: progressing still tight piriformis  5.  Patient will report 72 on FOTO to demonstrate improved functional ability.  Baseline:  9/28- 64  Goal status: MET but has regressed as she is doing more and more  6.  Will demonstrate improved core strength as evidenced by passing double leg lower test  Baseline:  Goal status: progressing 7.  Average 11,000 steps a day on her fit bit over a month period   PLAN: PT FREQUENCY: 2x/week  PT DURATION: 4  weeks  PLANNED INTERVENTIONS: Therapeutic exercises, Therapeutic activity, Neuromuscular re-education, Balance training, Gait training, Patient/Family education, Self Care, Joint mobilization, Dry Needling, Spinal manipulation, Spinal mobilization, Cryotherapy, Moist heat, Taping, Vasopneumatic device, Traction, Ionotophoresis 4mg /ml Dexamethasone, and Manual therapy  PLAN FOR NEXT SESSION: will look to do renewal later this week. Stacie Glaze, PT 08/09/2022, 10:12 AM   [facility] [address] [place], [place], [address] Phone: [phone]   Fax:  [phone]

## 2022-08-11 ENCOUNTER — Ambulatory Visit: Payer: Medicare Other | Admitting: Physical Therapy

## 2022-08-11 ENCOUNTER — Ambulatory Visit: Payer: Medicare Other | Admitting: Family

## 2022-08-11 ENCOUNTER — Encounter: Payer: Self-pay | Admitting: Physical Therapy

## 2022-08-11 DIAGNOSIS — M542 Cervicalgia: Secondary | ICD-10-CM

## 2022-08-11 DIAGNOSIS — M6281 Muscle weakness (generalized): Secondary | ICD-10-CM

## 2022-08-11 DIAGNOSIS — R519 Headache, unspecified: Secondary | ICD-10-CM | POA: Diagnosis not present

## 2022-08-11 DIAGNOSIS — G8929 Other chronic pain: Secondary | ICD-10-CM | POA: Diagnosis not present

## 2022-08-11 NOTE — Therapy (Signed)
OUTPATIENT PHYSICAL THERAPY CERVICAL TREATMENT    Patient Name: Veronica Galvan MRN: [id number] DOB:[birth year], 67 y.o., female Today's Date: 08/11/2022  PT End of Session - 08/11/22 0935     Visit Number 65    Date for PT Re-Evaluation 08/14/22    PT Start Time 0928    PT Stop Time 1015    PT Time Calculation (min) 47 min    Activity Tolerance Patient tolerated treatment well    Behavior During Therapy [facility] for tasks assessed/performed               Past Medical History:  Diagnosis Date   Allergy    Anxiety    Arthritis    hands   Cataract    Diabetes mellitus without complication    type 2   Family history of adverse reaction to anesthesia    son has malignant hyperthermia, daughter does not daughter recently had c section without problems   GERD (gastroesophageal reflux disease)    Headache    sinus   Hyperlipidemia    Hypertension    PONV (postoperative nausea and vomiting)    nausea only   Ulcer, stomach peptic yrs ago   Past Surgical History:  Procedure Laterality Date   APPENDECTOMY     both hells bone spur repair     both heels with metal clips   both shoulder rotator cuff repair     CESAREAN SECTION     x 1   COLONOSCOPY WITH PROPOFOL N/A 09/07/2016   Procedure: COLONOSCOPY WITH PROPOFOL;  Surgeon: Charolett Bumpers, MD;  Location: [facility];  Service: Endoscopy;  Laterality: N/A;   colonscopy  06/2011   polyps   EYE SURGERY     FRACTURE SURGERY     TUBAL LIGATION     VESICO-VAGINAL FISTULA REPAIR     Patient Active Problem List   Diagnosis Date Noted   Gastroesophageal reflux disease 11/16/2021   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 08/18/2021   Dermatofibroma 08/18/2021   History of malignant neoplasm of skin 08/18/2021   Lentigo 08/18/2021   Melanocytic nevi of trunk 08/18/2021   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   Rosacea 08/18/2021   Actinic keratosis 08/18/2021   Sensorineural hearing loss  (SNHL) of left ear with unrestricted hearing of right ear 07/26/2021   Tinnitus of left ear 07/26/2021   Acute recurrent maxillary sinusitis 06/22/2021   Primary hypertension 01/12/2021   Hyperlipidemia associated with type 2 diabetes mellitus 01/12/2021   Arthritis 01/12/2021   Type II diabetes mellitus 11/25/2019   Ingrown toenail 09/05/2018   Neck pain 01/29/2018   Tick bite 10/24/2017    PCP: Ria Clock  REFERRING PROVIDER: Ocie Doyne  REFERRING DIAG: M54.2  THERAPY DIAG:  Cervicalgia  Chronic intractable headache, unspecified headache type  Muscle weakness (generalized)  Rationale for Evaluation and Treatment Rehabilitation  ONSET DATE: 11/08/21  SUBJECTIVE:  SUBJECTIVE STATEMENT: Patient reports that she was very sore after the treatment, mms especially hips and the traps. PERTINENT HISTORY:  Arthritis, HTN, bilateral rotator cuff surgery 15-38yrs ago, DM  PAIN:  Are you having pain? Yes: NPRS scale: 06/10 Pain location: head and neck, shoulders  Pain description: tight  Aggravating factors: moving Relieving factors: massage, standing in the shower  WEIGHT BEARING RESTRICTIONS No  FALLS:  Has patient fallen in last 6 months? No  LIVING ENVIRONMENT: Lives with: lives alone Lives in: House/apartment Stairs: Yes: External: 3 steps; can reach both Has following equipment at home: None  OCCUPATION: reitred  PLOF: Independent  PATIENT GOALS to loosen it up, find out what is causing my headaches   OBJECTIVE:   DIAGNOSTIC FINDINGS:   FINDINGS: Brain: No acute infarct, mass effect or extra-axial collection. No acute or chronic hemorrhage. Normal white matter signal, parenchymal volume and CSF spaces. The midline structures are normal.  Skull and upper  cervical spine: Normal calvarium and skull base. Visualized upper cervical spine and soft tissues are normal.   Sinuses/Orbits:No paranasal sinus fluid levels or advanced mucosal thickening. No mastoid or middle ear effusion. Normal orbits.   IMPRESSION: Normal brain MRI.  PATIENT SURVEYS:  FOTO 53/100 ,  April 08, 2022 has been up to 82 and today was 68 but has been doing a lot and lifting and is back doing much more than she was.  January 4,2024 57/100 she does report tha tshe wnet backwards from her previous due to her being able to do so much more but is hurting more   COGNITION: Overall cognitive status: Within functional limits for tasks assessed  SENSATION: WFL  POSTURE: rounded shoulders, forward head, and increased thoracic kyphosis  PALPATION: TTP and sore along upper trap and cervical paraspinals, trigger points in R and L UT   CERVICAL ROM:   Active ROM A/PROM (deg) eval 12/30/21 01/05/22 9/28 10/31 04/05/22  Flexion 100% no pain WFL WFL WNL   WNL  Extension Mild limitations w/pain [facility]  WFL WNL   WNL  Right lateral flexion Mod limitation w/pain Mild limitations Mild limitations Severe limitation, leans with trunk  Decreased 75%  Decreased 50%  Left lateral flexion Mod limitation w/pain Mod limitations Mild limitations Severe limitation, leans with trunk  Decreased 75% Decreased  50%  Right rotation 75% w/pain Mild limitation w/pain WFL pain at end range  Mild limitation  Decreased 25%  Decreased 25%  Left rotation 75% w/pain  Mild limitation w/pain  WFL pain at end range  Mild limitation  Decreased 25% Decreased  25%   (Blank rows = not tested)  UPPER EXTREMITY ROM: WFL, limited in IR and ER due to prior RC surgery years ago   UPPER EXTREMITY MMT:  MMT Right eval Left eval Right 01/05/22 Left 01/05/22 R 9/28 L 9/28  Shoulder flexion 4+ 4+ 5 5 5 5   Shoulder extension        Shoulder abduction 4+ very painful 4+ 4+ with pain 5 5 5   Shoulder adduction         Shoulder extension        Shoulder internal rotation 4+ 4+ 5 5 5 5   Shoulder external rotation     5 5  Middle trapezius        Lower trapezius        Elbow flexion 5 5      Elbow extension 5 5      Wrist flexion  Wrist extension        Wrist ulnar deviation        Wrist radial deviation        Wrist pronation        Wrist supination        Grip strength         (Blank rows = not tested) 05/05/22 LE MMT  right 4+/5, left 4/5  SLR 40 degrees with pain in the legs and the back  Cannot cross her legs due to tight piriformis 25 degrees hip ER  Abdominal strength 2/5 unable to sit up or hold at 40 degrees  5x STS 21 seconds 06/14/22 TUG 10 seconds  5XSTS 16 seconds  Right 4+/5, left 4+/5  Manually I can cross her legs fully, she reports able to get   Socks on now, she can on her own without hands get her  Heels to mid patella  SLR to 55 degrees  Can hold plank 10 seconds elbows and toes  Had her A1C go down from 8.2 to 7.8   07/14/22  can hold plank 20 seconds x4  TUG 7 seconds  TODAY'S TREATMENT:  08/11/22 Weight 201# Elliptical level 4 x 3 minutes Leg curls 35# 2x15 Leg extension 15# 2x15 On airex 15# 2x12 Planks 4 x 20s cues to breath Feet on ball bridges 2x10 HS and piriformis stretch Partial eccentric sit ups 20# trunk axe chop Upper trap and levator stretches   08/09/22 Weight 202# 2 laps outside good pace one rest 15# straight arm pulls 20# AR press 10# hip extension abd, flexion 2x15 25# seated rows 25# lats 10# chest press 40# leg press Passive stretch to the LE's  07/21/22 Fast walk around the building no rest 10# hip extension, abduction and flexion Sit to stands 10# overhead press on sit fit  10# straight arm pulls 15# AR press 25# rows Black tband extension and abs HS, calf and piriformis stretch  07/19/22 Weight 203# Gait around the back building brisk pace, one short rest 60# leg press 2x15 35# straight arm pulls  35# triceps 5# hips  3 ways Tmill push and pulls 20s x 3 each 3# overhead stick carry Passive HS and piriformis stretch  07/14/22 Weight 204# Fast walk around the back building 10# Hip extension and abduction 15# AR press 15# straight arm pulls Leg press 60# 2x15 Planks 4x20 seconds On bosu balance 3# alternating work  07/12/22 Weight 207# Brisk walk around the back building without rest and able to carry on a conversation 35# leg curls 10# leg extension Passive stretch LE's Education in calories in and out, carbs, protein and fiber 25# rows 2x15 Lats 25# 2x15 Elliptical 3 minutes level 4  07/06/22 Gait on lap brisk pace around the back building Elliptical level 4 x 2 minutes On bosu two ways with 3# hand weights various alternating motions to challenge her balance 15# AR press 35# straight arm pull with the bicep bar Passive stretch to the LE's really focus on low load long duration to gain ROM  07/05/22 Walk around the back building fast pace Tmill push fwd and backward 20s x 3 each Seated row 25# 2x15 Lats 25# 2x15 10# straight arm pulls 10# hip flexion, abduction and extension Elliptical 2 minutes On bosu with 3# biceps, overhead press, punches 2 ways 10# sit to stands from low surface  06/30/22 Walk around the back building larger lap brisk pace Elliptical 2 minutes level 4 10# straight arm pulls 10#  hip abduction and extension 35# leg curls 3x10 10# leg extension 3x10 Bosu balance standing head turns and reaching Leg press 60# 3x10 Passive stretch HS and piriformis   PATIENT EDUCATION:  Education details: POC Person educated: Patient Education method: Explanation Education comprehension: verbalized understanding   HOME EXERCISE PROGRAM: Access Code: [email or web address] URL: [email or web address] Date: 12/01/2021 Prepared by: Cassie Freer   ASSESSMENT:  CLINICAL IMPRESSION: Patient very sore in the upper traps and the rhomboids.  She feels it was from the last  treatment and being off  a few weeks before that.  I continue to push her ability and see if we can get her stronger and more functional, she does seem to continue to lose weight which in the long run will help her overall REHAB POTENTIAL: Good  CLINICAL DECISION MAKING: Stable/uncomplicated  EVALUATION COMPLEXITY: Low   GOALS: Goals reviewed with patient? Yes  SHORT TERM GOALS: Target date: 12/29/21  Patient will be independent with initial HEP.  Goal status: MET  LONG TERM GOALS: Target date: 02/26/22  Patient will be independent with advanced/ongoing HEP to improve outcomes and carryover.  Goal status: IN PROGRESS  2.  Patient will report 75% improvement in neck pain to improve QOL.   Baseline: 9/28- not even 50% improvement maybe 40% Goal status: met  3.  Patient will demonstrate full pain free cervical ROM to complete ADLs and play with grandchildren with more ease. Goal status: met 4.  Patient will demonstrate improved posture to decrease muscle imbalance and tightness.  Baseline: increased tightness along cervical paraspinals Goal status: progressing still tight piriformis  5.  Patient will report 89 on FOTO to demonstrate improved functional ability.  Baseline:  9/28- 64  Goal status: MET but has regressed as she is doing more and more  6.  Will demonstrate improved core strength as evidenced by passing double leg lower test  Baseline:  Goal status: progressing 7.  Average 11,000 steps a day on her fit bit over a month period   PLAN: PT FREQUENCY: 2x/week  PT DURATION: 4 weeks  PLANNED INTERVENTIONS: Therapeutic exercises, Therapeutic activity, Neuromuscular re-education, Balance training, Gait training, Patient/Family education, Self Care, Joint mobilization, Dry Needling, Spinal manipulation, Spinal mobilization, Cryotherapy, Moist heat, Taping, Vasopneumatic device, Traction, Ionotophoresis 4mg /ml Dexamethasone, and Manual therapy  PLAN FOR NEXT SESSION:  renewal next week Stacie Glaze, PT 08/11/2022, 9:35 AM   [place]  [facility] [address] [facility] [place], [place], [address] Phone: [phone]   Fax:  [phone]

## 2022-08-12 ENCOUNTER — Telehealth: Payer: Self-pay | Admitting: Family

## 2022-08-12 NOTE — Telephone Encounter (Signed)
Copied from CRM [id number]. Topic: Medicare AWV >> Aug 12, 2022  2:38 PM Payton Doughty wrote: Reason for CRM: Called patient to reschedule Medicare Annual Wellness Visit (AWV). Left message for patient to call back to confirm New AWV appt date 08/29/2022   Last date of AWV: 08/19/2021  Please schedule an appointment at any time with Donne Anon, CMA    If any questions, please contact me.  Thank you ,  Verlee Rossetti; Care Guide Ambulatory Clinical Support [place] [facility] Direct Dial: [phone]

## 2022-08-12 NOTE — Telephone Encounter (Signed)
Contacted Veronica Galvan to schedule their annual wellness visit. Appointment made for 08/29/2022.  Veronica Galvan; Care Guide Ambulatory Clinical Support [place] [facility] Direct Dial: [phone]

## 2022-08-16 ENCOUNTER — Ambulatory Visit: Payer: Medicare Other | Admitting: Physical Therapy

## 2022-08-16 ENCOUNTER — Encounter: Payer: Self-pay | Admitting: Physical Therapy

## 2022-08-16 DIAGNOSIS — G8929 Other chronic pain: Secondary | ICD-10-CM | POA: Diagnosis not present

## 2022-08-16 DIAGNOSIS — M6281 Muscle weakness (generalized): Secondary | ICD-10-CM | POA: Diagnosis not present

## 2022-08-16 DIAGNOSIS — R519 Headache, unspecified: Secondary | ICD-10-CM

## 2022-08-16 DIAGNOSIS — M542 Cervicalgia: Secondary | ICD-10-CM

## 2022-08-16 NOTE — Therapy (Signed)
OUTPATIENT PHYSICAL THERAPY CERVICAL TREATMENT    Patient Name: Veronica Galvan MRN: [id number] DOB:Oct 06, 1955, 67 y.o., female Today's Date: 08/16/2022  PT End of Session - 08/16/22 0842     Visit Number 66    Date for PT Re-Evaluation 09/15/22    PT Start Time 0843    PT Stop Time 0930    PT Time Calculation (min) 47 min    Activity Tolerance Patient tolerated treatment well    Behavior During Therapy [facility] for tasks assessed/performed               Past Medical History:  Diagnosis Date   Allergy    Anxiety    Arthritis    hands   Cataract    Diabetes mellitus without complication    type 2   Family history of adverse reaction to anesthesia    son has malignant hyperthermia, daughter does not daughter recently had c section without problems   GERD (gastroesophageal reflux disease)    Headache    sinus   Hyperlipidemia    Hypertension    PONV (postoperative nausea and vomiting)    nausea only   Ulcer, stomach peptic yrs ago   Past Surgical History:  Procedure Laterality Date   APPENDECTOMY     both hells bone spur repair     both heels with metal clips   both shoulder rotator cuff repair     CESAREAN SECTION     x 1   COLONOSCOPY WITH PROPOFOL N/A 09/07/2016   Procedure: COLONOSCOPY WITH PROPOFOL;  Surgeon: Charolett Bumpers, MD;  Location: [facility];  Service: Endoscopy;  Laterality: N/A;   colonscopy  06/2011   polyps   EYE SURGERY     FRACTURE SURGERY     TUBAL LIGATION     VESICO-VAGINAL FISTULA REPAIR     Patient Active Problem List   Diagnosis Date Noted   Gastroesophageal reflux disease 11/16/2021   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 08/18/2021   Dermatofibroma 08/18/2021   History of malignant neoplasm of skin 08/18/2021   Lentigo 08/18/2021   Melanocytic nevi of trunk 08/18/2021   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   Rosacea 08/18/2021   Actinic keratosis 08/18/2021   Sensorineural hearing loss  (SNHL) of left ear with unrestricted hearing of right ear 07/26/2021   Tinnitus of left ear 07/26/2021   Acute recurrent maxillary sinusitis 06/22/2021   Primary hypertension 01/12/2021   Hyperlipidemia associated with type 2 diabetes mellitus 01/12/2021   Arthritis 01/12/2021   Type II diabetes mellitus 11/25/2019   Ingrown toenail 09/05/2018   Neck pain 01/29/2018   Tick bite 10/24/2017    PCP: Ria Clock  REFERRING PROVIDER: Ocie Doyne  REFERRING DIAG: M54.2  THERAPY DIAG:  Cervicalgia  Chronic intractable headache, unspecified headache type  Muscle weakness (generalized)  Rationale for Evaluation and Treatment Rehabilitation  ONSET DATE: 11/08/21  SUBJECTIVE:  SUBJECTIVE STATEMENT: Patient reports that she made bad decisions this past week, did not exercise as much PERTINENT HISTORY:  Arthritis, HTN, bilateral rotator cuff surgery 15-52yrs ago, DM  PAIN:  Are you having pain? Yes: NPRS scale: 06/10 Pain location: head and neck, shoulders  Pain description: tight  Aggravating factors: moving Relieving factors: massage, standing in the shower  WEIGHT BEARING RESTRICTIONS No  FALLS:  Has patient fallen in last 6 months? No  LIVING ENVIRONMENT: Lives with: lives alone Lives in: House/apartment Stairs: Yes: External: 3 steps; can reach both Has following equipment at home: None  OCCUPATION: reitred  PLOF: Independent  PATIENT GOALS to loosen it up, find out what is causing my headaches   OBJECTIVE:   DIAGNOSTIC FINDINGS:   FINDINGS: Brain: No acute infarct, mass effect or extra-axial collection. No acute or chronic hemorrhage. Normal white matter signal, parenchymal volume and CSF spaces. The midline structures are normal.  Skull and upper cervical  spine: Normal calvarium and skull base. Visualized upper cervical spine and soft tissues are normal.   Sinuses/Orbits:No paranasal sinus fluid levels or advanced mucosal thickening. No mastoid or middle ear effusion. Normal orbits.   IMPRESSION: Normal brain MRI.  PATIENT SURVEYS:  FOTO 53/100 ,  April 08, 2022 has been up to 29 and today was 7 but has been doing a lot and lifting and is back doing much more than she was.  January 4,2024 57/100 she does report tha tshe wnet backwards from her previous due to her being able to do so much more but is hurting more   COGNITION: Overall cognitive status: Within functional limits for tasks assessed  SENSATION: WFL  POSTURE: rounded shoulders, forward head, and increased thoracic kyphosis  PALPATION: TTP and sore along upper trap and cervical paraspinals, trigger points in R and L UT   CERVICAL ROM:   Active ROM A/PROM (deg) eval 12/30/21 01/05/22 9/28 10/31 04/05/22  Flexion 100% no pain WFL WFL WNL   WNL  Extension Mild limitations w/pain [facility]  WFL WNL   WNL  Right lateral flexion Mod limitation w/pain Mild limitations Mild limitations Severe limitation, leans with trunk  Decreased 75%  Decreased 50%  Left lateral flexion Mod limitation w/pain Mod limitations Mild limitations Severe limitation, leans with trunk  Decreased 75% Decreased  50%  Right rotation 75% w/pain Mild limitation w/pain WFL pain at end range  Mild limitation  Decreased 25%  Decreased 25%  Left rotation 75% w/pain  Mild limitation w/pain  WFL pain at end range  Mild limitation  Decreased 25% Decreased  25%   (Blank rows = not tested)  UPPER EXTREMITY ROM: WFL, limited in IR and ER due to prior RC surgery years ago   UPPER EXTREMITY MMT:  MMT Right eval Left eval Right 01/05/22 Left 01/05/22 R 9/28 L 9/28  Shoulder flexion 4+ 4+ Shoulder extension        Shoulder abduction 4+ very painful 4+ 4+ with pain Shoulder adduction        Shoulder  extension        Shoulder internal rotation 4+ 4+ Shoulder external rotation     5 5  Middle trapezius        Lower trapezius        Elbow flexion 5 5      Elbow extension 5 5      Wrist flexion  Wrist extension        Wrist ulnar deviation        Wrist radial deviation        Wrist pronation        Wrist supination        Grip strength         (Blank rows = not tested) 05/05/22 LE MMT  right 4+/5, left 4/5  SLR 40 degrees with pain in the legs and the back  Cannot cross her legs due to tight piriformis 25 degrees hip ER  Abdominal strength 2/5 unable to sit up or hold at 40 degrees  5x STS 21 seconds 06/14/22 TUG 10 seconds  5XSTS 16 seconds  Right 4+/5, left 4+/5  Manually I can cross her legs fully, she reports able to get   Socks on now, she can on her own without hands get her  Heels to mid patella  SLR to 55 degrees  Can hold plank 10 seconds elbows and toes  Had her A1C go down from 8.2 to 7.8   07/14/22  can hold plank 20 seconds x4  TUG 7 seconds  08/16/22  still very difficult to put socks on, is getting closer to being able to cross leg over.  Heels to superior patella. Weight today was 202#.  5XSTS 11 seconds.  Planks 30 seconds x3 and 20s x 1  TODAY'S TREATMENT:  08/16/22 Walking 2 laps brisk pace Balance on bosu 2 ways, balance, head turns and reaching  Airex plank side stepping and tandem gait LEg press 40# 2x15 PROM of the LE's Assessment as noted above  08/11/22 Weight 201# Elliptical level 4 x 3 minutes Leg curls 35# 2x15 Leg extension 15# 2x15 On airex 15# 2x12 Planks 4 x 20s cues to breath Feet on ball bridges 2x10 HS and piriformis stretch Partial eccentric sit ups 20# trunk axe chop Upper trap and levator stretches   08/09/22 Weight 202# 2 laps outside good pace one rest 15# straight arm pulls 20# AR press 10# hip extension abd, flexion 2x15 25# seated rows 25# lats 10# chest press 40# leg press Passive stretch to the  LE's  07/21/22 Fast walk around the building no rest 10# hip extension, abduction and flexion Sit to stands 10# overhead press on sit fit  10# straight arm pulls 15# AR press 25# rows Black tband extension and abs HS, calf and piriformis stretch  07/19/22 Weight 203# Gait around the back building brisk pace, one short rest 60# leg press 2x15 35# straight arm pulls  35# triceps 5# hips 3 ways Tmill push and pulls 20s x 3 each 3# overhead stick carry Passive HS and piriformis stretch  07/14/22 Weight 204# Fast walk around the back building 10# Hip extension and abduction 15# AR press 15# straight arm pulls Leg press 60# 2x15 Planks 4x20 seconds On bosu balance 3# alternating work  07/12/22 Weight 207# Brisk walk around the back building without rest and able to carry on a conversation 35# leg curls 10# leg extension Passive stretch LE's Education in calories in and out, carbs, protein and fiber 25# rows 2x15 Lats 25# 2x15 Elliptical 3 minutes level 4  07/06/22 Gait on lap brisk pace around the back building Elliptical level 4 x 2 minutes On bosu two ways with 3# hand weights various alternating motions to challenge her balance 15# AR press 35# straight arm pull with the bicep bar Passive stretch to the LE's really focus on low load long duration  to gain ROM   PATIENT EDUCATION:  Education details: POC Person educated: Patient Education method: Explanation Education comprehension: verbalized understanding   HOME EXERCISE PROGRAM: Access Code: [email or web address] URL: [email or web address] Date: 12/01/2021 Prepared by: Cassie Freer   ASSESSMENT:  CLINICAL IMPRESSION: Patient continues to improve as we continue to push her, her improvement are noted above in ROM, speed of 5XSTS, ability to hold plank and her overall ability to tolerate activity.  She has been losing weight and reports that she is conscious of her steps daily and her choices when eating and  snacking.  Due to her continuing to improve in overall function as well as having less pain overall we will continue to work on progression REHAB POTENTIAL: Good  CLINICAL DECISION MAKING: Stable/uncomplicated  EVALUATION COMPLEXITY: Low   GOALS: Goals reviewed with patient? Yes  SHORT TERM GOALS: Target date: 12/29/21  Patient will be independent with initial HEP.  Goal status: MET  LONG TERM GOALS: Target date: 02/26/22  Patient will be independent with advanced/ongoing HEP to improve outcomes and carryover.  Goal status: continuing 08/16/22  2.  Patient will report 75% improvement in neck pain to improve QOL.   Baseline: 9/28- not even 50% improvement maybe 40% Goal status: met  3.  Patient will demonstrate full pain free cervical ROM to complete ADLs and play with grandchildren with more ease. Goal status: met 4.  Patient will demonstrate improved posture to decrease muscle imbalance and tightness.  Baseline: increased tightness along cervical paraspinals Goal status: progressing still tight piriformis 08/16/22  5.  Patient will report 75 on FOTO to demonstrate improved functional ability.  Baseline:  9/28- 64  Goal status: MET but has regressed as she is doing more and more  6.  Will demonstrate improved core strength as evidenced by passing double leg lower test  Baseline:  Goal status: progressing 08/16/22 7.  Average 11,000 steps a day on her fit bit over a month period.  Currently about 40981 steps   PLAN: PT FREQUENCY: 2x/week  PT DURATION: 4 weeks  PLANNED INTERVENTIONS: Therapeutic exercises, Therapeutic activity, Neuromuscular re-education, Balance training, Gait training, Patient/Family education, Self Care, Joint mobilization, Dry Needling, Spinal manipulation, Spinal mobilization, Cryotherapy, Moist heat, Taping, Vasopneumatic device, Traction, Ionotophoresis 4mg /ml Dexamethasone, and Manual therapy  PLAN FOR NEXT SESSION: sent renewal to MD, feel like she  is doing well with her overall progression and increasing her function Stacie Glaze, PT 08/16/2022, 8:43 AM   [place] [address] [facility] [place], [place], [address] Phone: [phone]   Fax:  [phone]

## 2022-08-18 ENCOUNTER — Ambulatory Visit: Payer: Medicare Other | Admitting: Physical Therapy

## 2022-08-18 ENCOUNTER — Encounter: Payer: Self-pay | Admitting: Physical Therapy

## 2022-08-18 DIAGNOSIS — R519 Headache, unspecified: Secondary | ICD-10-CM

## 2022-08-18 DIAGNOSIS — M542 Cervicalgia: Secondary | ICD-10-CM | POA: Diagnosis not present

## 2022-08-18 DIAGNOSIS — M6281 Muscle weakness (generalized): Secondary | ICD-10-CM

## 2022-08-18 DIAGNOSIS — G8929 Other chronic pain: Secondary | ICD-10-CM | POA: Diagnosis not present

## 2022-08-18 NOTE — Therapy (Signed)
OUTPATIENT PHYSICAL THERAPY CERVICAL TREATMENT    Patient Name: Veronica Galvan MRN: [id number] DOB:1955-12-05, 67 y.o., female Today's Date: 08/18/2022  PT End of Session - 08/18/22 1525     Visit Number 67    Date for PT Re-Evaluation 09/15/22    PT Start Time 1526    PT Stop Time 1613    PT Time Calculation (min) 47 min    Activity Tolerance Patient tolerated treatment well    Behavior During Therapy WFL for tasks assessed/performed               Past Medical History:  Diagnosis Date   Allergy    Anxiety    Arthritis    hands   Cataract    Diabetes mellitus without complication    type 2   Family history of adverse reaction to anesthesia    son has malignant hyperthermia, daughter does not daughter recently had c section without problems   GERD (gastroesophageal reflux disease)    Headache    sinus   Hyperlipidemia    Hypertension    PONV (postoperative nausea and vomiting)    nausea only   Ulcer, stomach peptic yrs ago   Past Surgical History:  Procedure Laterality Date   APPENDECTOMY     both hells bone spur repair     both heels with metal clips   both shoulder rotator cuff repair     CESAREAN SECTION     x 1   COLONOSCOPY WITH PROPOFOL N/A 09/07/2016   Procedure: COLONOSCOPY WITH PROPOFOL;  Surgeon: Charolett Bumpers, MD;  Location: [facility];  Service: Endoscopy;  Laterality: N/A;   colonscopy  06/2011   polyps   EYE SURGERY     FRACTURE SURGERY     TUBAL LIGATION     VESICO-VAGINAL FISTULA REPAIR     Patient Active Problem List   Diagnosis Date Noted   Gastroesophageal reflux disease 11/16/2021   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 08/18/2021   Dermatofibroma 08/18/2021   History of malignant neoplasm of skin 08/18/2021   Lentigo 08/18/2021   Melanocytic nevi of trunk 08/18/2021   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   Rosacea 08/18/2021   Actinic keratosis 08/18/2021   Sensorineural hearing loss  (SNHL) of left ear with unrestricted hearing of right ear 07/26/2021   Tinnitus of left ear 07/26/2021   Acute recurrent maxillary sinusitis 06/22/2021   Primary hypertension 01/12/2021   Hyperlipidemia associated with type 2 diabetes mellitus 01/12/2021   Arthritis 01/12/2021   Type II diabetes mellitus 11/25/2019   Ingrown toenail 09/05/2018   Neck pain 01/29/2018   Tick bite 10/24/2017    PCP: Ria Clock  REFERRING PROVIDER: Ocie Doyne  REFERRING DIAG: M54.2  THERAPY DIAG:  Cervicalgia  Chronic intractable headache, unspecified headache type  Muscle weakness (generalized)  Rationale for Evaluation and Treatment Rehabilitation  ONSET DATE: 11/08/21  SUBJECTIVE:  SUBJECTIVE STATEMENT: I have a really bad HA today, "maybe pollen" neck is stiff and tight and sore PERTINENT HISTORY:  Arthritis, HTN, bilateral rotator cuff surgery 15-21yrs ago, DM  PAIN:  Are you having pain? Yes: NPRS scale: 09/10 Pain location: head and neck, shoulders  Pain description: tight  Aggravating factors: moving Relieving factors: massage, standing in the shower  WEIGHT BEARING RESTRICTIONS No  FALLS:  Has patient fallen in last 6 months? No  LIVING ENVIRONMENT: Lives with: lives alone Lives in: House/apartment Stairs: Yes: External: 3 steps; can reach both Has following equipment at home: None  OCCUPATION: reitred  PLOF: Independent  PATIENT GOALS to loosen it up, find out what is causing my headaches   OBJECTIVE:   DIAGNOSTIC FINDINGS:   FINDINGS: Brain: No acute infarct, mass effect or extra-axial collection. No acute or chronic hemorrhage. Normal white matter signal, parenchymal volume and CSF spaces. The midline structures are normal.  Skull and upper cervical spine:  Normal calvarium and skull base. Visualized upper cervical spine and soft tissues are normal.   Sinuses/Orbits:No paranasal sinus fluid levels or advanced mucosal thickening. No mastoid or middle ear effusion. Normal orbits.   IMPRESSION: Normal brain MRI.  PATIENT SURVEYS:  FOTO 53/100 ,  April 08, 2022 has been up to 56 and today was 30 but has been doing a lot and lifting and is back doing much more than she was.  January 4,2024 57/100 she does report tha tshe wnet backwards from her previous due to her being able to do so much more but is hurting more   COGNITION: Overall cognitive status: Within functional limits for tasks assessed  SENSATION: WFL  POSTURE: rounded shoulders, forward head, and increased thoracic kyphosis  PALPATION: TTP and sore along upper trap and cervical paraspinals, trigger points in R and L UT   CERVICAL ROM:   Active ROM A/PROM (deg) eval 12/30/21 01/05/22 9/28 10/31 04/05/22  Flexion 100% no pain WFL WFL WNL   WNL  Extension Mild limitations w/pain [facility]  WFL WNL   WNL  Right lateral flexion Mod limitation w/pain Mild limitations Mild limitations Severe limitation, leans with trunk  Decreased 75%  Decreased 50%  Left lateral flexion Mod limitation w/pain Mod limitations Mild limitations Severe limitation, leans with trunk  Decreased 75% Decreased  50%  Right rotation 75% w/pain Mild limitation w/pain WFL pain at end range  Mild limitation  Decreased 25%  Decreased 25%  Left rotation 75% w/pain  Mild limitation w/pain  WFL pain at end range  Mild limitation  Decreased 25% Decreased  25%   (Blank rows = not tested)  UPPER EXTREMITY ROM: WFL, limited in IR and ER due to prior RC surgery years ago   UPPER EXTREMITY MMT:  MMT Right eval Left eval Right 01/05/22 Left 01/05/22 R 9/28 L 9/28  Shoulder flexion 4+ 4+ 5 5 5 5   Shoulder extension        Shoulder abduction 4+ very painful 4+ 4+ with pain 5 5 5   Shoulder adduction        Shoulder extension         Shoulder internal rotation 4+ 4+ 5 5 5 5   Shoulder external rotation     5 5  Middle trapezius        Lower trapezius        Elbow flexion 5 5      Elbow extension 5 5      Wrist flexion  Wrist extension        Wrist ulnar deviation        Wrist radial deviation        Wrist pronation        Wrist supination        Grip strength         (Blank rows = not tested) 05/05/22 LE MMT  right 4+/5, left 4/5  SLR 40 degrees with pain in the legs and the back  Cannot cross her legs due to tight piriformis 25 degrees hip ER  Abdominal strength 2/5 unable to sit up or hold at 40 degrees  5x STS 21 seconds 06/14/22 TUG 10 seconds  5XSTS 16 seconds  Right 4+/5, left 4+/5  Manually I can cross her legs fully, she reports able to get   Socks on now, she can on her own without hands get her  Heels to mid patella  SLR to 55 degrees  Can hold plank 10 seconds elbows and toes  Had her A1C go down from 8.2 to 7.8   07/14/22  can hold plank 20 seconds x4  TUG 7 seconds  08/16/22  still very difficult to put socks on, is getting closer to being able to cross leg over.  Heels to superior patella. Weight today was 202#.  5XSTS 11 seconds.  Planks 30 seconds x3 and 20s x 1  TODAY'S TREATMENT:  08/18/22 UBE level 4 x 6 minutes Rows 25# Lats 25# Black tband extension HS curls 25# 2x15 Leg extension 10# 2x15 On bosu 4# biceps, punches, ball toss and weighted ball  STM to the right upper trap and rhomboid  08/16/22 Walking 2 laps brisk pace Balance on bosu 2 ways, balance, head turns and reaching  Airex plank side stepping and tandem gait LEg press 40# 2x15 PROM of the LE's Assessment as noted above  08/11/22 Weight 201# Elliptical level 4 x 3 minutes Leg curls 35# 2x15 Leg extension 15# 2x15 On airex 15# 2x12 Planks 4 x 20s cues to breath Feet on ball bridges 2x10 HS and piriformis stretch Partial eccentric sit ups 20# trunk axe chop Upper trap and levator  stretches   08/09/22 Weight 202# 2 laps outside good pace one rest 15# straight arm pulls 20# AR press 10# hip extension abd, flexion 2x15 25# seated rows 25# lats 10# chest press 40# leg press Passive stretch to the LE's  07/21/22 Fast walk around the building no rest 10# hip extension, abduction and flexion Sit to stands 10# overhead press on sit fit  10# straight arm pulls 15# AR press 25# rows Black tband extension and abs HS, calf and piriformis stretch  07/19/22 Weight 203# Gait around the back building brisk pace, one short rest 60# leg press 2x15 35# straight arm pulls  35# triceps 5# hips 3 ways Tmill push and pulls 20s x 3 each 3# overhead stick carry Passive HS and piriformis stretch  07/14/22 Weight 204# Fast walk around the back building 10# Hip extension and abduction 15# AR press 15# straight arm pulls Leg press 60# 2x15 Planks 4x20 seconds On bosu balance 3# alternating work  07/12/22 Weight 207# Brisk walk around the back building without rest and able to carry on a conversation 35# leg curls 10# leg extension Passive stretch LE's Education in calories in and out, carbs, protein and fiber 25# rows 2x15 Lats 25# 2x15 Elliptical 3 minutes level 4   PATIENT EDUCATION:  Education details: POC Person educated: Patient Education method:  Explanation Education comprehension: verbalized understanding   HOME EXERCISE PROGRAM: Access Code: [email or web address] URL: [email or web address] Date: 12/01/2021 Prepared by: Cassie Freer   ASSESSMENT:  CLINICAL IMPRESSION: Patient with significant HA today, unsure of the cause, the knot is back in the right upper trap and into the rhomboid.  She is very tender in this area.  Tried to loosen this up  REHAB POTENTIAL: Good  CLINICAL DECISION MAKING: Stable/uncomplicated  EVALUATION COMPLEXITY: Low   GOALS: Goals reviewed with patient? Yes  SHORT TERM GOALS: Target date: 12/29/21  Patient  will be independent with initial HEP.  Goal status: MET  LONG TERM GOALS: Target date: 02/26/22  Patient will be independent with advanced/ongoing HEP to improve outcomes and carryover.  Goal status: continuing 08/16/22  2.  Patient will report 75% improvement in neck pain to improve QOL.   Baseline: 9/28- not even 50% improvement maybe 40% Goal status: met  3.  Patient will demonstrate full pain free cervical ROM to complete ADLs and play with grandchildren with more ease. Goal status: met 4.  Patient will demonstrate improved posture to decrease muscle imbalance and tightness.  Baseline: increased tightness along cervical paraspinals Goal status: progressing still tight piriformis 08/16/22  5.  Patient will report 36 on FOTO to demonstrate improved functional ability.  Baseline:  9/28- 64  Goal status: MET but has regressed as she is doing more and more  6.  Will demonstrate improved core strength as evidenced by passing double leg lower test  Baseline:  Goal status: progressing 08/16/22 7.  Average 11,000 steps a day on her fit bit over a month period.  Currently about 78295 steps   PLAN: PT FREQUENCY: 2x/week  PT DURATION: 4 weeks  PLANNED INTERVENTIONS: Therapeutic exercises, Therapeutic activity, Neuromuscular re-education, Balance training, Gait training, Patient/Family education, Self Care, Joint mobilization, Dry Needling, Spinal manipulation, Spinal mobilization, Cryotherapy, Moist heat, Taping, Vasopneumatic device, Traction, Ionotophoresis /ml Dexamethasone, and Manual therapy  PLAN FOR NEXT SESSION:  feel like she is doing well with her overall progression and increasing her function Stacie Glaze, PT 08/18/2022, 3:26 PM   [facility] [address] [facility]. [place], [place], [address] Phone: [phone]   Fax:  [phone]

## 2022-08-23 ENCOUNTER — Encounter: Payer: Self-pay | Admitting: Physical Therapy

## 2022-08-23 ENCOUNTER — Ambulatory Visit: Payer: Medicare Other | Admitting: Physical Therapy

## 2022-08-23 DIAGNOSIS — M542 Cervicalgia: Secondary | ICD-10-CM

## 2022-08-23 DIAGNOSIS — M6281 Muscle weakness (generalized): Secondary | ICD-10-CM

## 2022-08-23 DIAGNOSIS — G8929 Other chronic pain: Secondary | ICD-10-CM | POA: Diagnosis not present

## 2022-08-23 DIAGNOSIS — R519 Headache, unspecified: Secondary | ICD-10-CM | POA: Diagnosis not present

## 2022-08-23 NOTE — Therapy (Signed)
OUTPATIENT PHYSICAL THERAPY CERVICAL TREATMENT    Patient Name: Veronica Galvan MRN: [id number] DOB:April 22, 1956, 67 y.o., female Today's Date: 08/23/2022  PT End of Session - 08/23/22 1313     Visit Number 68    Date for PT Re-Evaluation 09/15/22    PT Start Time 1313    PT Stop Time 1400    PT Time Calculation (min) 47 min    Activity Tolerance Patient tolerated treatment well    Behavior During Therapy WFL for tasks assessed/performed               Past Medical History:  Diagnosis Date   Allergy    Anxiety    Arthritis    hands   Cataract    Diabetes mellitus without complication    type 2   Family history of adverse reaction to anesthesia    son has malignant hyperthermia, daughter does not daughter recently had c section without problems   GERD (gastroesophageal reflux disease)    Headache    sinus   Hyperlipidemia    Hypertension    PONV (postoperative nausea and vomiting)    nausea only   Ulcer, stomach peptic yrs ago   Past Surgical History:  Procedure Laterality Date   APPENDECTOMY     both hells bone spur repair     both heels with metal clips   both shoulder rotator cuff repair     CESAREAN SECTION     x 1   COLONOSCOPY WITH PROPOFOL N/A 09/07/2016   Procedure: COLONOSCOPY WITH PROPOFOL;  Surgeon: Charolett Bumpers, MD;  Location: [facility];  Service: Endoscopy;  Laterality: N/A;   colonscopy  06/2011   polyps   EYE SURGERY     FRACTURE SURGERY     TUBAL LIGATION     VESICO-VAGINAL FISTULA REPAIR     Patient Active Problem List   Diagnosis Date Noted   Gastroesophageal reflux disease 11/16/2021   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 08/18/2021   Dermatofibroma 08/18/2021   History of malignant neoplasm of skin 08/18/2021   Lentigo 08/18/2021   Melanocytic nevi of trunk 08/18/2021   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   Rosacea 08/18/2021   Actinic keratosis 08/18/2021   Sensorineural hearing loss  (SNHL) of left ear with unrestricted hearing of right ear 07/26/2021   Tinnitus of left ear 07/26/2021   Acute recurrent maxillary sinusitis 06/22/2021   Primary hypertension 01/12/2021   Hyperlipidemia associated with type 2 diabetes mellitus 01/12/2021   Arthritis 01/12/2021   Type II diabetes mellitus 11/25/2019   Ingrown toenail 09/05/2018   Neck pain 01/29/2018   Tick bite 10/24/2017    PCP: Ria Clock  REFERRING PROVIDER: Ocie Doyne  REFERRING DIAG: M54.2  THERAPY DIAG:  Cervicalgia  Chronic intractable headache, unspecified headache type  Muscle weakness (generalized)  Rationale for Evaluation and Treatment Rehabilitation  ONSET DATE: 11/08/21  SUBJECTIVE:  SUBJECTIVE STATEMENT: Doing okay, felt much better after the last treatment  PERTINENT HISTORY:  Arthritis, HTN, bilateral rotator cuff surgery 15-18yrs ago, DM  PAIN:  Are you having pain? Yes: NPRS scale: 09/10 Pain location: head and neck, shoulders  Pain description: tight  Aggravating factors: moving Relieving factors: massage, standing in the shower  WEIGHT BEARING RESTRICTIONS No  FALLS:  Has patient fallen in last 6 months? No  LIVING ENVIRONMENT: Lives with: lives alone Lives in: House/apartment Stairs: Yes: External: 3 steps; can reach both Has following equipment at home: None  OCCUPATION: reitred  PLOF: Independent  PATIENT GOALS to loosen it up, find out what is causing my headaches   OBJECTIVE:   DIAGNOSTIC FINDINGS:   FINDINGS: Brain: No acute infarct, mass effect or extra-axial collection. No acute or chronic hemorrhage. Normal white matter signal, parenchymal volume and CSF spaces. The midline structures are normal.  Skull and upper cervical spine: Normal calvarium and  skull base. Visualized upper cervical spine and soft tissues are normal.   Sinuses/Orbits:No paranasal sinus fluid levels or advanced mucosal thickening. No mastoid or middle ear effusion. Normal orbits.   IMPRESSION: Normal brain MRI.  PATIENT SURVEYS:  FOTO 53/100 ,  April 08, 2022 has been up to 68 and today was 40 but has been doing a lot and lifting and is back doing much more than she was.  January 4,2024 57/100 she does report tha tshe wnet backwards from her previous due to her being able to do so much more but is hurting more   COGNITION: Overall cognitive status: Within functional limits for tasks assessed  SENSATION: WFL  POSTURE: rounded shoulders, forward head, and increased thoracic kyphosis  PALPATION: TTP and sore along upper trap and cervical paraspinals, trigger points in R and L UT   CERVICAL ROM:   Active ROM A/PROM (deg) eval 12/30/21 01/05/22 9/28 10/31 04/05/22  Flexion 100% no pain WFL WFL WNL   WNL  Extension Mild limitations w/pain [facility]  WFL WNL   WNL  Right lateral flexion Mod limitation w/pain Mild limitations Mild limitations Severe limitation, leans with trunk  Decreased 75%  Decreased 50%  Left lateral flexion Mod limitation w/pain Mod limitations Mild limitations Severe limitation, leans with trunk  Decreased 75% Decreased  50%  Right rotation 75% w/pain Mild limitation w/pain WFL pain at end range  Mild limitation  Decreased 25%  Decreased 25%  Left rotation 75% w/pain  Mild limitation w/pain  WFL pain at end range  Mild limitation  Decreased 25% Decreased  25%   (Blank rows = not tested)  UPPER EXTREMITY ROM: WFL, limited in IR and ER due to prior RC surgery years ago   UPPER EXTREMITY MMT:  MMT Right eval Left eval Right 01/05/22 Left 01/05/22 R 9/28 L 9/28  Shoulder flexion 4+ 4+ Shoulder extension        Shoulder abduction 4+ very painful 4+ 4+ with pain Shoulder adduction        Shoulder extension        Shoulder  internal rotation 4+ 4+ Shoulder external rotation     5 5  Middle trapezius        Lower trapezius        Elbow flexion 5 5      Elbow extension 5 5      Wrist flexion        Wrist extension  Wrist ulnar deviation        Wrist radial deviation        Wrist pronation        Wrist supination        Grip strength         (Blank rows = not tested) 05/05/22 LE MMT  right 4+/5, left 4/5  SLR 40 degrees with pain in the legs and the back  Cannot cross her legs due to tight piriformis 25 degrees hip ER  Abdominal strength 2/5 unable to sit up or hold at 40 degrees  5x STS 21 seconds 06/14/22 TUG 10 seconds  5XSTS 16 seconds  Right 4+/5, left 4+/5  Manually I can cross her legs fully, she reports able to get   Socks on now, she can on her own without hands get her  Heels to mid patella  SLR to 55 degrees  Can hold plank 10 seconds elbows and toes  Had her A1C go down from 8.2 to 7.8   07/14/22  can hold plank 20 seconds x4  TUG 7 seconds  08/16/22  still very difficult to put socks on, is getting closer to being able to cross leg over.  Heels to superior patella. Weight today was 202#.  5XSTS 11 seconds.  Planks 30 seconds x3 and 20s x 1  TODAY'S TREATMENT:  08/23/22 Gait 2 laps around the back building 15# straight arm pulls 25# lats and Rows Tmill push two ways Nustep level 6 x 5 minutes shooting for 350 steps On bosu 3# biceps, punches and abduction 10# hip extension and abduction  08/18/22 UBE level 4 x 6 minutes Rows 25# Lats 25# Black tband extension HS curls 25# 2x15 Leg extension 10# 2x15 On bosu 4# biceps, punches, ball toss and weighted ball  STM to the right upper trap and rhomboid  08/16/22 Walking 2 laps brisk pace Balance on bosu 2 ways, balance, head turns and reaching  Airex plank side stepping and tandem gait LEg press 40# 2x15 PROM of the LE's Assessment as noted above  08/11/22 Weight 201# Elliptical level 4 x 3 minutes Leg curls 35#  2x15 Leg extension 15# 2x15 On airex 15# 2x12 Planks 4 x 20s cues to breath Feet on ball bridges 2x10 HS and piriformis stretch Partial eccentric sit ups 20# trunk axe chop Upper trap and levator stretches   08/09/22 Weight 202# 2 laps outside good pace one rest 15# straight arm pulls 20# AR press 10# hip extension abd, flexion 2x15 25# seated rows 25# lats 10# chest press 40# leg press Passive stretch to the LE's  07/21/22 Fast walk around the building no rest 10# hip extension, abduction and flexion Sit to stands 10# overhead press on sit fit  10# straight arm pulls 15# AR press 25# rows Black tband extension and abs HS, calf and piriformis stretch  07/19/22 Weight 203# Gait around the back building brisk pace, one short rest 60# leg press 2x15 35# straight arm pulls  35# triceps 5# hips 3 ways Tmill push and pulls 20s x 3 each 3# overhead stick carry Passive HS and piriformis stretch  07/14/22 Weight 204# Fast walk around the back building 10# Hip extension and abduction 15# AR press 15# straight arm pulls Leg press 60# 2x15 Planks 4x20 seconds On bosu balance 3# alternating work  07/12/22 Weight 207# Brisk walk around the back building without rest and able to carry on a conversation 35# leg curls 10# leg extension Passive stretch LE's  Education in calories in and out, carbs, protein and fiber 25# rows 2x15 Lats 25# 2x15 Elliptical 3 minutes level 4   PATIENT EDUCATION:  Education details: POC Person educated: Patient Education method: Explanation Education comprehension: verbalized understanding   HOME EXERCISE PROGRAM: Access Code: [email or web address] URL: [email or web address] Date: 12/01/2021 Prepared by: Cassie Freer   ASSESSMENT:  CLINICAL IMPRESSION: Patient reports fatigue and tiredness today, able to do all I asked of her but c/o fatigue and needed some rest breaks today.  She reported frustration because she felt  weak  REHAB POTENTIAL: Good  CLINICAL DECISION MAKING: Stable/uncomplicated  EVALUATION COMPLEXITY: Low   GOALS: Goals reviewed with patient? Yes  SHORT TERM GOALS: Target date: 12/29/21  Patient will be independent with initial HEP.  Goal status: MET  LONG TERM GOALS: Target date: 02/26/22  Patient will be independent with advanced/ongoing HEP to improve outcomes and carryover.  Goal status: continuing 08/16/22  2.  Patient will report 75% improvement in neck pain to improve QOL.   Baseline: 9/28- not even 50% improvement maybe 40% Goal status: met  3.  Patient will demonstrate full pain free cervical ROM to complete ADLs and play with grandchildren with more ease. Goal status: met 4.  Patient will demonstrate improved posture to decrease muscle imbalance and tightness.  Baseline: increased tightness along cervical paraspinals Goal status: progressing still tight piriformis 08/16/22  5.  Patient will report 62 on FOTO to demonstrate improved functional ability.  Baseline:  9/28- 64  Goal status: MET but has regressed as she is doing more and more  6.  Will demonstrate improved core strength as evidenced by passing double leg lower test  Baseline:  Goal status: progressing 08/16/22 7.  Average 11,000 steps a day on her fit bit over a month period.  Currently about 16109 steps   PLAN: PT FREQUENCY: 2x/week  PT DURATION: 4 weeks  PLANNED INTERVENTIONS: Therapeutic exercises, Therapeutic activity, Neuromuscular re-education, Balance training, Gait training, Patient/Family education, Self Care, Joint mobilization, Dry Needling, Spinal manipulation, Spinal mobilization, Cryotherapy, Moist heat, Taping, Vasopneumatic device, Traction, Ionotophoresis 4mg /ml Dexamethasone, and Manual therapy  PLAN FOR NEXT SESSION:  assess Stacie Glaze, PT 08/23/2022, 1:14 PM   [facility] at [facility] [address] [facility]. [place], [place],  [address] Phone: [phone]   Fax:  [phone]

## 2022-08-25 ENCOUNTER — Ambulatory Visit: Payer: Medicare Other | Admitting: Physical Therapy

## 2022-08-25 ENCOUNTER — Encounter: Payer: Self-pay | Admitting: Physical Therapy

## 2022-08-25 DIAGNOSIS — M542 Cervicalgia: Secondary | ICD-10-CM

## 2022-08-25 DIAGNOSIS — R519 Headache, unspecified: Secondary | ICD-10-CM | POA: Diagnosis not present

## 2022-08-25 DIAGNOSIS — M6281 Muscle weakness (generalized): Secondary | ICD-10-CM

## 2022-08-25 DIAGNOSIS — G8929 Other chronic pain: Secondary | ICD-10-CM

## 2022-08-25 NOTE — Therapy (Signed)
OUTPATIENT PHYSICAL THERAPY CERVICAL TREATMENT    Patient Name: Veronica Galvan MRN: [id number] DOB:04-06-1956, 67 y.o., female Today's Date: 08/25/2022  PT End of Session - 08/25/22 0855     Visit Number 69    Date for PT Re-Evaluation 09/15/22    PT Start Time 0840    PT Stop Time 0926    PT Time Calculation (min) 46 min    Activity Tolerance Patient tolerated treatment well    Behavior During Therapy [facility] for tasks assessed/performed               Past Medical History:  Diagnosis Date   Allergy    Anxiety    Arthritis    hands   Cataract    Diabetes mellitus without complication    type 2   Family history of adverse reaction to anesthesia    son has malignant hyperthermia, daughter does not daughter recently had c section without problems   GERD (gastroesophageal reflux disease)    Headache    sinus   Hyperlipidemia    Hypertension    PONV (postoperative nausea and vomiting)    nausea only   Ulcer, stomach peptic yrs ago   Past Surgical History:  Procedure Laterality Date   APPENDECTOMY     both hells bone spur repair     both heels with metal clips   both shoulder rotator cuff repair     CESAREAN SECTION     x 1   COLONOSCOPY WITH PROPOFOL N/A 09/07/2016   Procedure: COLONOSCOPY WITH PROPOFOL;  Surgeon: Charolett Bumpers, MD;  Location: [facility];  Service: Endoscopy;  Laterality: N/A;   colonscopy  06/2011   polyps   EYE SURGERY     FRACTURE SURGERY     TUBAL LIGATION     VESICO-VAGINAL FISTULA REPAIR     Patient Active Problem List   Diagnosis Date Noted   Gastroesophageal reflux disease 11/16/2021   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 08/18/2021   Dermatofibroma 08/18/2021   History of malignant neoplasm of skin 08/18/2021   Lentigo 08/18/2021   Melanocytic nevi of trunk 08/18/2021   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   Rosacea 08/18/2021   Actinic keratosis 08/18/2021   Sensorineural hearing loss  (SNHL) of left ear with unrestricted hearing of right ear 07/26/2021   Tinnitus of left ear 07/26/2021   Acute recurrent maxillary sinusitis 06/22/2021   Primary hypertension 01/12/2021   Hyperlipidemia associated with type 2 diabetes mellitus 01/12/2021   Arthritis 01/12/2021   Type II diabetes mellitus 11/25/2019   Ingrown toenail 09/05/2018   Neck pain 01/29/2018   Tick bite 10/24/2017    PCP: Ria Clock  REFERRING PROVIDER: Ocie Doyne  REFERRING DIAG: M54.2  THERAPY DIAG:  Cervicalgia  Chronic intractable headache, unspecified headache type  Muscle weakness (generalized)  Rationale for Evaluation and Treatment Rehabilitation  ONSET DATE: 11/08/21  SUBJECTIVE:  SUBJECTIVE STATEMENT: I think I am doing better, less pain and making much better decisions on eating PERTINENT HISTORY:  Arthritis, HTN, bilateral rotator cuff surgery 15-50yrs ago, DM  PAIN:  Are you having pain? Yes: NPRS scale: 5/10 Pain location: head and neck, shoulders  Pain description: tight  Aggravating factors: moving Relieving factors: massage, standing in the shower  WEIGHT BEARING RESTRICTIONS No  FALLS:  Has patient fallen in last 6 months? No  LIVING ENVIRONMENT: Lives with: lives alone Lives in: House/apartment Stairs: Yes: External: 3 steps; can reach both Has following equipment at home: None  OCCUPATION: reitred  PLOF: Independent  PATIENT GOALS to loosen it up, find out what is causing my headaches   OBJECTIVE:   DIAGNOSTIC FINDINGS:   FINDINGS: Brain: No acute infarct, mass effect or extra-axial collection. No acute or chronic hemorrhage. Normal white matter signal, parenchymal volume and CSF spaces. The midline structures are normal.  Skull and upper cervical spine:  Normal calvarium and skull base. Visualized upper cervical spine and soft tissues are normal.   Sinuses/Orbits:No paranasal sinus fluid levels or advanced mucosal thickening. No mastoid or middle ear effusion. Normal orbits.   IMPRESSION: Normal brain MRI.  PATIENT SURVEYS:  FOTO 53/100 ,  April 08, 2022 has been up to 46 and today was 68 but has been doing a lot and lifting and is back doing much more than she was.  January 4,2024 57/100 she does report tha tshe wnet backwards from her previous due to her being able to do so much more but is hurting more   COGNITION: Overall cognitive status: Within functional limits for tasks assessed  SENSATION: WFL  POSTURE: rounded shoulders, forward head, and increased thoracic kyphosis  PALPATION: TTP and sore along upper trap and cervical paraspinals, trigger points in R and L UT   CERVICAL ROM:   Active ROM A/PROM (deg) eval 12/30/21 01/05/22 9/28 10/31 04/05/22  Flexion 100% no pain WFL WFL WNL   WNL  Extension Mild limitations w/pain [facility]  WFL WNL   WNL  Right lateral flexion Mod limitation w/pain Mild limitations Mild limitations Severe limitation, leans with trunk  Decreased 75%  Decreased 50%  Left lateral flexion Mod limitation w/pain Mod limitations Mild limitations Severe limitation, leans with trunk  Decreased 75% Decreased  50%  Right rotation 75% w/pain Mild limitation w/pain WFL pain at end range  Mild limitation  Decreased 25%  Decreased 25%  Left rotation 75% w/pain  Mild limitation w/pain  WFL pain at end range  Mild limitation  Decreased 25% Decreased  25%   (Blank rows = not tested)  UPPER EXTREMITY ROM: WFL, limited in IR and ER due to prior RC surgery years ago   UPPER EXTREMITY MMT:  MMT Right eval Left eval Right 01/05/22 Left 01/05/22 R 9/28 L 9/28  Shoulder flexion 4+ 4+ 5 5 5 5   Shoulder extension        Shoulder abduction 4+ very painful 4+ 4+ with pain 5 5 5   Shoulder adduction        Shoulder extension         Shoulder internal rotation 4+ 4+ 5 5 5 5   Shoulder external rotation     5 5  Middle trapezius        Lower trapezius        Elbow flexion 5 5      Elbow extension 5 5      Wrist flexion  Wrist extension        Wrist ulnar deviation        Wrist radial deviation        Wrist pronation        Wrist supination        Grip strength         (Blank rows = not tested) 05/05/22 LE MMT  right 4+/5, left 4/5  SLR 40 degrees with pain in the legs and the back  Cannot cross her legs due to tight piriformis 25 degrees hip ER  Abdominal strength 2/5 unable to sit up or hold at 40 degrees  5x STS 21 seconds 06/14/22 TUG 10 seconds  5XSTS 16 seconds  Right 4+/5, left 4+/5  Manually I can cross her legs fully, she reports able to get   Socks on now, she can on her own without hands get her  Heels to mid patella  SLR to 55 degrees  Can hold plank 10 seconds elbows and toes  Had her A1C go down from 8.2 to 7.8   07/14/22  can hold plank 20 seconds x4  TUG 7 seconds  08/16/22  still very difficult to put socks on, is getting closer to being able to cross leg over.  Heels to superior patella. Weight today was 202#.  5XSTS 11 seconds.  Planks 30 seconds x3 and 20s x 1  TODAY'S TREATMENT:  08/25/22 Gait 2 laps with 3# biceps and punches Seated row 25# Lats 25# 60# leg press 2x10 Worked on getting up from the floor able to do pretty well Planks 2x 30s Elliptical 4 minutes 10# hip extension, abduction and flexion WEight 200.5#  08/23/22 Gait 2 laps around the back building 15# straight arm pulls 25# lats and Rows Tmill push two ways Nustep level 6 x 5 minutes shooting for 350 steps On bosu 3# biceps, punches and abduction 10# hip extension and abduction  08/18/22 UBE level 4 x 6 minutes Rows 25# Lats 25# Black tband extension HS curls 25# 2x15 Leg extension 10# 2x15 On bosu 4# biceps, punches, ball toss and weighted ball  STM to the right upper trap and  rhomboid  08/16/22 Walking 2 laps brisk pace Balance on bosu 2 ways, balance, head turns and reaching  Airex plank side stepping and tandem gait LEg press 40# 2x15 PROM of the LE's Assessment as noted above  08/11/22 Weight 201# Elliptical level 4 x 3 minutes Leg curls 35# 2x15 Leg extension 15# 2x15 On airex 15# 2x12 Planks 4 x 20s cues to breath Feet on ball bridges 2x10 HS and piriformis stretch Partial eccentric sit ups 20# trunk axe chop Upper trap and levator stretches   08/09/22 Weight 202# 2 laps outside good pace one rest 15# straight arm pulls 20# AR press 10# hip extension abd, flexion 2x15 25# seated rows 25# lats 10# chest press 40# leg press Passive stretch to the LE's  07/21/22 Fast walk around the building no rest 10# hip extension, abduction and flexion Sit to stands 10# overhead press on sit fit  10# straight arm pulls 15# AR press 25# rows Black tband extension and abs HS, calf and piriformis stretch  07/19/22 Weight 203# Gait around the back building brisk pace, one short rest 60# leg press 2x15 35# straight arm pulls  35# triceps 5# hips 3 ways Tmill push and pulls 20s x 3 each 3# overhead stick carry Passive HS and piriformis stretch  07/14/22 Weight 204# Fast walk around the back  building 10# Hip extension and abduction 15# AR press 15# straight arm pulls Leg press 60# 2x15 Planks 4x20 seconds On bosu balance 3# alternating work  07/12/22 Weight 207# Brisk walk around the back building without rest and able to carry on a conversation 35# leg curls 10# leg extension Passive stretch LE's Education in calories in and out, carbs, protein and fiber 25# rows 2x15 Lats 25# 2x15 Elliptical 3 minutes level 4   PATIENT EDUCATION:  Education details: POC Person educated: Patient Education method: Explanation Education comprehension: verbalized understanding   HOME EXERCISE PROGRAM: Access Code: [email or web address] URL:  [email or web address] Date: 12/01/2021 Prepared by: Cassie Freer   ASSESSMENT:  CLINICAL IMPRESSION: I continue to push her limitations for strength, function and flexibility.  She is doing well and tried really hard, I upped to planks and her time on the elliptical, worked on getting up from the floor  REHAB POTENTIAL: Good  CLINICAL DECISION MAKING: Stable/uncomplicated  EVALUATION COMPLEXITY: Low   GOALS: Goals reviewed with patient? Yes  SHORT TERM GOALS: Target date: 12/29/21  Patient will be independent with initial HEP.  Goal status: MET  LONG TERM GOALS: Target date: 02/26/22  Patient will be independent with advanced/ongoing HEP to improve outcomes and carryover.  Goal status: continuing 08/16/22  2.  Patient will report 75% improvement in neck pain to improve QOL.   Baseline: 9/28- not even 50% improvement maybe 40% Goal status: met  3.  Patient will demonstrate full pain free cervical ROM to complete ADLs and play with grandchildren with more ease. Goal status: met 4.  Patient will demonstrate improved posture to decrease muscle imbalance and tightness.  Baseline: increased tightness along cervical paraspinals Goal status: progressing still tight piriformis 08/16/22  5.  Patient will report 30 on FOTO to demonstrate improved functional ability.  Baseline:  9/28- 64  Goal status: MET but has regressed as she is doing more and more  6.  Will demonstrate improved core strength as evidenced by passing double leg lower test  Baseline:  Goal status: progressing 08/16/22 7.  Average 11,000 steps a day on her fit bit over a month period.  Met 08/25/22   PLAN: PT FREQUENCY: 2x/week  PT DURATION: 4 weeks  PLANNED INTERVENTIONS: Therapeutic exercises, Therapeutic activity, Neuromuscular re-education, Balance training, Gait training, Patient/Family education, Self Care, Joint mobilization, Dry Needling, Spinal manipulation, Spinal mobilization,  Cryotherapy, Moist heat, Taping, Vasopneumatic device, Traction, Ionotophoresis /ml Dexamethasone, and Manual therapy  PLAN FOR NEXT SESSION:  continue to progress Stacie Glaze, PT 08/25/2022, 8:56 AM   [facility] [facility] [address] [facility]. [place], [place], [address] Phone: [phone]   Fax:  [phone]

## 2022-08-29 ENCOUNTER — Encounter: Payer: Self-pay | Admitting: Physical Therapy

## 2022-08-29 ENCOUNTER — Ambulatory Visit (INDEPENDENT_AMBULATORY_CARE_PROVIDER_SITE_OTHER): Payer: Medicare Other | Admitting: *Deleted

## 2022-08-29 ENCOUNTER — Ambulatory Visit: Payer: Medicare Other | Admitting: Physical Therapy

## 2022-08-29 VITALS — HR 91 | Ht 61.0 in | Wt 199.3 lb

## 2022-08-29 DIAGNOSIS — G8929 Other chronic pain: Secondary | ICD-10-CM | POA: Diagnosis not present

## 2022-08-29 DIAGNOSIS — M542 Cervicalgia: Secondary | ICD-10-CM | POA: Diagnosis not present

## 2022-08-29 DIAGNOSIS — M6281 Muscle weakness (generalized): Secondary | ICD-10-CM

## 2022-08-29 DIAGNOSIS — R519 Headache, unspecified: Secondary | ICD-10-CM | POA: Diagnosis not present

## 2022-08-29 DIAGNOSIS — Z0000 Encounter for general adult medical examination without abnormal findings: Secondary | ICD-10-CM

## 2022-08-29 NOTE — Therapy (Signed)
OUTPATIENT PHYSICAL THERAPY CERVICAL TREATMENT  Progress Note Reporting Period 07/14/22 to 08/29/22 for visits 61-70  See note below for Objective Data and Assessment of Progress/Goals.      Patient Name: Veronica Galvan MRN: [id number] DOB:07-25-1955, 67 y.o., female Today's Date: 08/29/2022  PT End of Session - 08/29/22 0841     Visit Number 70    Date for PT Re-Evaluation 09/15/22    PT Start Time 0841    PT Stop Time 0927    PT Time Calculation (min) 46 min    Activity Tolerance Patient tolerated treatment well    Behavior During Therapy [facility] for tasks assessed/performed               Past Medical History:  Diagnosis Date   Allergy    Anxiety    Arthritis    hands   Cataract    Diabetes mellitus without complication (HCC)    type 2   Family history of adverse reaction to anesthesia    son has malignant hyperthermia, daughter does not daughter recently had c section without problems   GERD (gastroesophageal reflux disease)    Headache    sinus   Hyperlipidemia    Hypertension    PONV (postoperative nausea and vomiting)    nausea only   Ulcer, stomach peptic yrs ago   Past Surgical History:  Procedure Laterality Date   APPENDECTOMY     both hells bone spur repair     both heels with metal clips   both shoulder rotator cuff repair     CESAREAN SECTION     x 1   COLONOSCOPY WITH PROPOFOL N/A 09/07/2016   Procedure: COLONOSCOPY WITH PROPOFOL;  Surgeon: Charolett Bumpers, MD;  Location: [facility];  Service: Endoscopy;  Laterality: N/A;   colonscopy  06/2011   polyps   EYE SURGERY     FRACTURE SURGERY     TUBAL LIGATION     VESICO-VAGINAL FISTULA REPAIR     Patient Active Problem List   Diagnosis Date Noted   Gastroesophageal reflux disease 11/16/2021   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 08/18/2021   Dermatofibroma 08/18/2021   History of malignant neoplasm of skin 08/18/2021   Lentigo 08/18/2021   Melanocytic nevi of trunk  08/18/2021   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   Rosacea 08/18/2021   Actinic keratosis 08/18/2021   Sensorineural hearing loss (SNHL) of left ear with unrestricted hearing of right ear 07/26/2021   Tinnitus of left ear 07/26/2021   Acute recurrent maxillary sinusitis 06/22/2021   Primary hypertension 01/12/2021   Hyperlipidemia associated with type 2 diabetes mellitus (HCC) 01/12/2021   Arthritis 01/12/2021   Type II diabetes mellitus (HCC) 11/25/2019   Ingrown toenail 09/05/2018   Neck pain 01/29/2018   Tick bite 10/24/2017    PCP: Ria Clock  REFERRING PROVIDER: Ocie Doyne  REFERRING DIAG: M54.2  THERAPY DIAG:  Cervicalgia  Chronic intractable headache, unspecified headache type  Muscle weakness (generalized)  Rationale for Evaluation and Treatment Rehabilitation  ONSET DATE: 11/08/21  SUBJECTIVE:  SUBJECTIVE STATEMENT: Did okay this weekend.  Reports that she helped someone move and she is having some increased scapular area pain, very tender to touch PERTINENT HISTORY:  Arthritis, HTN, bilateral rotator cuff surgery 15-84yrs ago, DM  PAIN:  Are you having pain? Yes: NPRS scale: 5/10 Pain location: head and neck, shoulders  Pain description: tight  Aggravating factors: moving Relieving factors: massage, standing in the shower  WEIGHT BEARING RESTRICTIONS No  FALLS:  Has patient fallen in last 6 months? No  LIVING ENVIRONMENT: Lives with: lives alone Lives in: House/apartment Stairs: Yes: External: 3 steps; can reach both Has following equipment at home: None  OCCUPATION: reitred  PLOF: Independent  PATIENT GOALS to loosen it up, find out what is causing my headaches   OBJECTIVE:   DIAGNOSTIC FINDINGS:   FINDINGS: Brain: No  acute infarct, mass effect or extra-axial collection. No acute or chronic hemorrhage. Normal white matter signal, parenchymal volume and CSF spaces. The midline structures are normal.  Skull and upper cervical spine: Normal calvarium and skull base. Visualized upper cervical spine and soft tissues are normal.   Sinuses/Orbits:No paranasal sinus fluid levels or advanced mucosal thickening. No mastoid or middle ear effusion. Normal orbits.   IMPRESSION: Normal brain MRI.  PATIENT SURVEYS:  FOTO 53/100 ,  April 08, 2022 has been up to 28 and today was 76 but has been doing a lot and lifting and is back doing much more than she was.  January 4,2024 57/100 she does report tha tshe wnet backwards from her previous due to her being able to do so much more but is hurting more   COGNITION: Overall cognitive status: Within functional limits for tasks assessed  SENSATION: WFL  POSTURE: rounded shoulders, forward head, and increased thoracic kyphosis  PALPATION: TTP and sore along upper trap and cervical paraspinals, trigger points in R and L UT   CERVICAL ROM:   Active ROM A/PROM (deg) eval 12/30/21 01/05/22 9/28 10/31 04/05/22  Flexion 100% no pain WFL WFL WNL   WNL  Extension Mild limitations w/pain [facility]  WFL WNL   WNL  Right lateral flexion Mod limitation w/pain Mild limitations Mild limitations Severe limitation, leans with trunk  Decreased 75%  Decreased 50%  Left lateral flexion Mod limitation w/pain Mod limitations Mild limitations Severe limitation, leans with trunk  Decreased 75% Decreased  50%  Right rotation 75% w/pain Mild limitation w/pain WFL pain at end range  Mild limitation  Decreased 25%  Decreased 25%  Left rotation 75% w/pain  Mild limitation w/pain  WFL pain at end range  Mild limitation  Decreased 25% Decreased  25%   (Blank rows = not tested)  UPPER EXTREMITY ROM: WFL, limited in IR and ER due to prior RC surgery years ago   UPPER EXTREMITY MMT:  MMT  Right eval Left eval Right 01/05/22 Left 01/05/22 R 9/28 L 9/28  Shoulder flexion 4+ 4+ 5 5 5 5   Shoulder extension        Shoulder abduction 4+ very painful 4+ 4+ with pain 5 5 5   Shoulder adduction        Shoulder extension        Shoulder internal rotation 4+ 4+ 5 5 5 5   Shoulder external rotation     5 5  Middle trapezius        Lower trapezius        Elbow flexion 5 5      Elbow extension 5 5  Wrist flexion        Wrist extension        Wrist ulnar deviation        Wrist radial deviation        Wrist pronation        Wrist supination        Grip strength         (Blank rows = not tested) 05/05/22 LE MMT  right 4+/5, left 4/5  SLR 40 degrees with pain in the legs and the back  Cannot cross her legs due to tight piriformis 25 degrees hip ER  Abdominal strength 2/5 unable to sit up or hold at 40 degrees  5x STS 21 seconds 06/14/22 TUG 10 seconds  5XSTS 16 seconds  Right 4+/5, left 4+/5  Manually I can cross her legs fully, she reports able to get   Socks on now, she can on her own without hands get her  Heels to mid patella  SLR to 55 degrees  Can hold plank 10 seconds elbows and toes  Had her A1C go down from 8.2 to 7.8   07/14/22  can hold plank 20 seconds x4  TUG 7 seconds  08/16/22  still very difficult to put socks on, is getting closer to being able to cross leg over.  Heels to superior patella. Weight today was 202#.  5XSTS 11 seconds.  Planks 30 seconds x3 and 20s x 1  TODAY'S TREATMENT:  08/29/22 2 laps brisk pace Leg extension 15# 2x15 LEg curls 35# 3x10 15# straight arm pulls 20# AR press 10# hip extension and hip abduction 10# and 20# sit to stand 25# rows 25# lats Tmill pushes 3x30s Passive HS and piriformis stretch  08/25/22 Gait 2 laps with 3# biceps and punches Seated row 25# Lats 25# 60# leg press 2x10 Worked on getting up from the floor able to do pretty well Planks 2x 30s Elliptical 4 minutes 10# hip extension, abduction and  flexion WEight 200.5#  08/23/22 Gait 2 laps around the back building 15# straight arm pulls 25# lats and Rows Tmill push two ways Nustep level 6 x 5 minutes shooting for 350 steps On bosu 3# biceps, punches and abduction 10# hip extension and abduction  08/18/22 UBE level 4 x 6 minutes Rows 25# Lats 25# Black tband extension HS curls 25# 2x15 Leg extension 10# 2x15 On bosu 4# biceps, punches, ball toss and weighted ball  STM to the right upper trap and rhomboid  08/16/22 Walking 2 laps brisk pace Balance on bosu 2 ways, balance, head turns and reaching  Airex plank side stepping and tandem gait LEg press 40# 2x15 PROM of the LE's Assessment as noted above  08/11/22 Weight 201# Elliptical level 4 x 3 minutes Leg curls 35# 2x15 Leg extension 15# 2x15 On airex 15# 2x12 Planks 4 x 20s cues to breath Feet on ball bridges 2x10 HS and piriformis stretch Partial eccentric sit ups 20# trunk axe chop Upper trap and levator stretches   08/09/22 Weight 202# 2 laps outside good pace one rest 15# straight arm pulls 20# AR press 10# hip extension abd, flexion 2x15 25# seated rows 25# lats 10# chest press 40# leg press Passive stretch to the LE's  07/21/22 Fast walk around the building no rest 10# hip extension, abduction and flexion Sit to stands 10# overhead press on sit fit  10# straight arm pulls 15# AR press 25# rows Black tband extension and abs HS, calf and piriformis stretch  07/19/22 Weight 203# Gait around the back building brisk pace, one short rest 60# leg press 2x15 35# straight arm pulls  35# triceps 5# hips 3 ways Tmill push and pulls 20s x 3 each 3# overhead stick carry Passive HS and piriformis stretch  07/14/22 Weight 204# Fast walk around the back building 10# Hip extension and abduction 15# AR press 15# straight arm pulls Leg press 60# 2x15 Planks 4x20 seconds On bosu balance 3# alternating work  07/12/22 Weight 207# Brisk walk around  the back building without rest and able to carry on a conversation 35# leg curls 10# leg extension Passive stretch LE's Education in calories in and out, carbs, protein and fiber 25# rows 2x15 Lats 25# 2x15 Elliptical 3 minutes level 4   PATIENT EDUCATION:  Education details: POC Person educated: Patient Education method: Explanation Education comprehension: verbalized understanding   HOME EXERCISE PROGRAM: Access Code: [email or web address] URL: [email or web address] Date: 12/01/2021 Prepared by: Cassie Freer   ASSESSMENT:  CLINICAL IMPRESSION: I continue to push her limitations for strength, function and flexibility.  Reports that she has a medicare call today and reports that when she did this call last year she was in so much pain and really was not able to do much, she reports that this has been a great thing for her as far as health and wellness  REHAB POTENTIAL: Good  CLINICAL DECISION MAKING: Stable/uncomplicated  EVALUATION COMPLEXITY: Low   GOALS: Goals reviewed with patient? Yes  SHORT TERM GOALS: Target date: 12/29/21  Patient will be independent with initial HEP.  Goal status: MET  LONG TERM GOALS: Target date: 02/26/22  Patient will be independent with advanced/ongoing HEP to improve outcomes and carryover.  Goal status: continuing 08/16/22  2.  Patient will report 75% improvement in neck pain to improve QOL.   Baseline: 9/28- not even 50% improvement maybe 40% Goal status: met  3.  Patient will demonstrate full pain free cervical ROM to complete ADLs and play with grandchildren with more ease. Goal status: met 4.  Patient will demonstrate improved posture to decrease muscle imbalance and tightness.  Baseline: increased tightness along cervical paraspinals Goal status: progressing still tight piriformis 08/16/22  5.  Patient will report 69 on FOTO to demonstrate improved functional ability.  Baseline:  9/28- 64  Goal status: MET but has  regressed as she is doing more and more  6.  Will demonstrate improved core strength as evidenced by passing double leg lower test  Baseline:  Goal status: progressing 08/16/22 7.  Average 11,000 steps a day on her fit bit over a month period.  Met 08/25/22   PLAN: PT FREQUENCY: 2x/week  PT DURATION: 4 weeks  PLANNED INTERVENTIONS: Therapeutic exercises, Therapeutic activity, Neuromuscular re-education, Balance training, Gait training, Patient/Family education, Self Care, Joint mobilization, Dry Needling, Spinal manipulation, Spinal mobilization, Cryotherapy, Moist heat, Taping, Vasopneumatic device, Traction, Ionotophoresis 4mg /ml Dexamethasone, and Manual therapy  PLAN FOR NEXT SESSION:  continue to progress Stacie Glaze, PT 08/29/2022, 8:42 AM   [place] [facility] [address] [facility] [place], [place], [address] Phone: [phone]   Fax:  [phone]

## 2022-08-29 NOTE — Patient Instructions (Signed)
Veronica Galvan , Thank you for taking time to come for your Medicare Wellness Visit. I appreciate your ongoing commitment to your health goals. Please review the following plan we discussed and let me know if I can assist you in the future.     This is a list of the screening recommended for you and due dates:  Health Maintenance  Topic Date Due   COVID-19 Vaccine (6 - 2023-24 season) 03/30/2022   Complete foot exam   10/02/2022   Hemoglobin A1C  11/17/2022   Flu Shot  12/01/2022   Eye exam for diabetics  12/09/2022   Yearly kidney health urinalysis for diabetes  02/18/2023   Yearly kidney function blood test for diabetes  05/20/2023   Medicare Annual Wellness Visit  08/29/2023   Colon Cancer Screening  09/08/2023   Mammogram  05/30/2024   DTaP/Tdap/Td vaccine (2 - Td or Tdap) 03/31/2026   Pneumonia Vaccine  Completed   DEXA scan (bone density measurement)  Completed   Hepatitis C Screening: USPSTF Recommendation to screen - Ages 59-79 yo.  Completed   Zoster (Shingles) Vaccine  Completed   HPV Vaccine  Aged Out    Next appointment: Follow up in one year for your annual wellness visit.   Preventive Care 67 Years and Older, Female Preventive care refers to lifestyle choices and visits with your health care provider that can promote health and wellness. What does preventive care include? A yearly physical exam. This is also called an annual well check. Dental exams once or twice a year. Routine eye exams. Ask your health care provider how often you should have your eyes checked. Personal lifestyle choices, including: Daily care of your teeth and gums. Regular physical activity. Eating a healthy diet. Avoiding tobacco and drug use. Limiting alcohol use. Practicing safe sex. Taking low-dose aspirin every day. Taking vitamin and mineral supplements as recommended by your health care provider. What happens during an annual well check? The services and screenings done by your  health care provider during your annual well check will depend on your age, overall health, lifestyle risk factors, and family history of disease. Counseling  Your health care provider may ask you questions about your: Alcohol use. Tobacco use. Drug use. Emotional well-being. Home and relationship well-being. Sexual activity. Eating habits. History of falls. Memory and ability to understand (cognition). Work and work Astronomer. Reproductive health. Screening  You may have the following tests or measurements: Height, weight, and BMI. Blood pressure. Lipid and cholesterol levels. These may be checked every 5 years, or more frequently if you are over 11 years old. Skin check. Lung cancer screening. You may have this screening every year starting at age 53 if you have a 30-pack-year history of smoking and currently smoke or have quit within the past 15 years. Fecal occult blood test (FOBT) of the stool. You may have this test every year starting at age 10. Flexible sigmoidoscopy or colonoscopy. You may have a sigmoidoscopy every 5 years or a colonoscopy every 10 years starting at age 1. Hepatitis C blood test. Hepatitis B blood test. Sexually transmitted disease (STD) testing. Diabetes screening. This is done by checking your blood sugar (glucose) after you have not eaten for a while (fasting). You may have this done every 1-3 years. Bone density scan. This is done to screen for osteoporosis. You may have this done starting at age 66. Mammogram. This may be done every 1-2 years. Talk to your health care provider about how often you  should have regular mammograms. Talk with your health care provider about your test results, treatment options, and if necessary, the need for more tests. Vaccines  Your health care provider may recommend certain vaccines, such as: Influenza vaccine. This is recommended every year. Tetanus, diphtheria, and acellular pertussis (Tdap, Td) vaccine. You may  need a Td booster every 10 years. Zoster vaccine. You may need this after age 66. Pneumococcal 13-valent conjugate (PCV13) vaccine. One dose is recommended after age 73. Pneumococcal polysaccharide (PPSV23) vaccine. One dose is recommended after age 74. Talk to your health care provider about which screenings and vaccines you need and how often you need them. This information is not intended to replace advice given to you by your health care provider. Make sure you discuss any questions you have with your health care provider. Document Released: 05/15/2015 Document Revised: 01/06/2016 Document Reviewed: 02/17/2015 Elsevier Interactive Patient Education  2017 [place] Prevention in the Home Falls can cause injuries. They can happen to people of all ages. There are many things you can do to make your home safe and to help prevent falls. What can I do on the outside of my home? Regularly fix the edges of walkways and driveways and fix any cracks. Remove anything that might make you trip as you walk through a door, such as a raised step or threshold. Trim any bushes or trees on the path to your home. Use bright outdoor lighting. Clear any walking paths of anything that might make someone trip, such as rocks or tools. Regularly check to see if handrails are loose or broken. Make sure that both sides of any steps have handrails. Any raised decks and porches should have guardrails on the edges. Have any leaves, snow, or ice cleared regularly. Use sand or salt on walking paths during winter. Clean up any spills in your garage right away. This includes oil or grease spills. What can I do in the bathroom? Use night lights. Install grab bars by the toilet and in the tub and shower. Do not use towel bars as grab bars. Use non-skid mats or decals in the tub or shower. If you need to sit down in the shower, use a plastic, non-slip stool. Keep the floor dry. Clean up any water that spills on  the floor as soon as it happens. Remove soap buildup in the tub or shower regularly. Attach bath mats securely with double-sided non-slip rug tape. Do not have throw rugs and other things on the floor that can make you trip. What can I do in the bedroom? Use night lights. Make sure that you have a light by your bed that is easy to reach. Do not use any sheets or blankets that are too big for your bed. They should not hang down onto the floor. Have a firm chair that has side arms. You can use this for support while you get dressed. Do not have throw rugs and other things on the floor that can make you trip. What can I do in the kitchen? Clean up any spills right away. Avoid walking on wet floors. Keep items that you use a lot in easy-to-reach places. If you need to reach something above you, use a strong step stool that has a grab bar. Keep electrical cords out of the way. Do not use floor polish or wax that makes floors slippery. If you must use wax, use non-skid floor wax. Do not have throw rugs and other things on the  floor that can make you trip. What can I do with my stairs? Do not leave any items on the stairs. Make sure that there are handrails on both sides of the stairs and use them. Fix handrails that are broken or loose. Make sure that handrails are as long as the stairways. Check any carpeting to make sure that it is firmly attached to the stairs. Fix any carpet that is loose or worn. Avoid having throw rugs at the top or bottom of the stairs. If you do have throw rugs, attach them to the floor with carpet tape. Make sure that you have a light switch at the top of the stairs and the bottom of the stairs. If you do not have them, ask someone to add them for you. What else can I do to help prevent falls? Wear shoes that: Do not have high heels. Have rubber bottoms. Are comfortable and fit you well. Are closed at the toe. Do not wear sandals. If you use a stepladder: Make sure  that it is fully opened. Do not climb a closed stepladder. Make sure that both sides of the stepladder are locked into place. Ask someone to hold it for you, if possible. Clearly mark and make sure that you can see: Any grab bars or handrails. First and last steps. Where the edge of each step is. Use tools that help you move around (mobility aids) if they are needed. These include: Canes. Walkers. Scooters. Crutches. Turn on the lights when you go into a dark area. Replace any light bulbs as soon as they burn out. Set up your furniture so you have a clear path. Avoid moving your furniture around. If any of your floors are uneven, fix them. If there are any pets around you, be aware of where they are. Review your medicines with your doctor. Some medicines can make you feel dizzy. This can increase your chance of falling. Ask your doctor what other things that you can do to help prevent falls. This information is not intended to replace advice given to you by your health care provider. Make sure you discuss any questions you have with your health care provider. Document Released: 02/12/2009 Document Revised: 09/24/2015 Document Reviewed: 05/23/2014 Elsevier Interactive Patient Education  2017 [employer].

## 2022-08-29 NOTE — Progress Notes (Signed)
Subjective:  Pt completed ADLs, Fall risk, and SDOH during e-check in on 08/27/22.  Answers verified with pt.    Veronica Galvan is a 67 y.o. female who presents for Medicare Annual (Subsequent) preventive examination.  I connected with  [name] on 08/29/22 by a audio enabled telemedicine application and verified that I am speaking with the correct person using two identifiers.  Patient Location: Home  Provider Location: Office/Clinic  I discussed the limitations of evaluation and management by telemedicine. The patient expressed understanding and agreed to proceed.   Review of Systems     Cardiac Risk Factors include: advanced age (>20men, >86 women);diabetes mellitus;dyslipidemia;hypertension;obesity (BMI >30kg/m2)     Objective:    Today's Vitals   08/29/22 1029  Pulse: 91  Weight: 199 lb 4.8 oz (90.4 kg)  Height: 5\' 1"  (1.549 m)   Body mass index is 37.66 kg/m.     08/29/2022   10:26 AM 12/01/2021   10:23 AM 08/19/2021    9:43 AM 08/25/2016   11:43 AM  Advanced Directives  Does Patient Have a Medical Advance Directive? Yes Yes Yes Yes  Type of Estate agent of [place];Living will Healthcare Power of [place];Living will Healthcare Power of [place];Living will Healthcare Power of [place];Living will  Does patient want to make changes to medical advance directive?  No - Patient declined  No - Patient declined  Copy of Healthcare Power of Attorney in Chart? No - copy requested  No - copy requested     Current Medications (verified) Outpatient Encounter Medications as of 08/29/2022  Medication Sig   ALPRAZolam (XANAX) 0.5 MG tablet Take 1 tablet (0.5 mg total) by mouth at bedtime as needed for anxiety.   Ascorbic Acid (VITAMIN C) 1000 MG tablet Take 1,000 mg by mouth every morning.   aspirin EC 81 MG tablet Take 81 mg by mouth every morning.   atorvastatin (LIPITOR) 40 MG tablet Take 1 tablet (40 mg total) by mouth every evening.    Calcium Carb-Cholecalciferol (CALCIUM 600+D3 PO) Take 1 tablet by mouth every morning.   celecoxib (CELEBREX) 200 MG capsule Take 1 capsule (200 mg total) by mouth daily.   Cholecalciferol (VITAMIN D) 2000 units tablet Take 2,000 Units by mouth daily.   Coenzyme Q10 300 MG CAPS Take 1 capsule by mouth every morning.   diclofenac Sodium (VOLTAREN) 1 % GEL Apply topically 4 (four) times daily. (Patient not taking: Reported on 08/05/2022)   fluconazole (DIFLUCAN) 100 MG tablet Take 1 tablet (100 mg total) by mouth daily.   glucose blood test strip Use as instructed   levocetirizine (XYZAL) 5 MG tablet Take 1 tablet (5 mg total) by mouth every evening.   lisinopril (ZESTRIL) 5 MG tablet Take 1 tablet (5 mg total) by mouth 2 (two) times daily.   LUTEIN-ZEAXANTHIN PO Take 1 tablet by mouth every morning.   Multiple Vitamins-Minerals (MULTIVITAMIN WITH MINERALS) tablet Take 1 tablet by mouth every morning.   nystatin-triamcinolone ointment (MYCOLOG) Apply 1 Application topically 2 (two) times daily.   Omega-3 Fatty Acids (EQL OMEGA 3 FISH OIL) 1400 MG CAPS Take 2 capsules by mouth daily.   omeprazole (PRILOSEC) 20 MG capsule Take 1 capsule (20 mg total) by mouth every morning.   Semaglutide, 2 MG/DOSE, 8 MG/3ML SOPN Inject 2 mg as directed once a week.   SYNJARDY XR 12.08-998 MG TB24 Take 1 tablet by mouth 2 (two) times daily.   UNABLE TO FIND Med Name: Cinnamon/Cinsilin  VITAMIN E PO Take by mouth.   [DISCONTINUED] azithromycin (ZITHROMAX Z-PAK) 250 MG tablet Take 2 tablets (500 mg) PO today, then 1 tablet (250 mg) PO daily x4 days.   No facility-administered encounter medications on file as of 08/29/2022.    Allergies (verified) Codeine, Benzonatate, and Epinephrine   History: Past Medical History:  Diagnosis Date   Allergy    Anxiety    Arthritis    hands   Cataract    Diabetes mellitus without complication (HCC)    type 2   Family history of adverse reaction to anesthesia    son has  malignant hyperthermia, daughter does not daughter recently had c section without problems   GERD (gastroesophageal reflux disease)    Headache    sinus   Hyperlipidemia    Hypertension    PONV (postoperative nausea and vomiting)    nausea only   Ulcer, stomach peptic yrs ago   Past Surgical History:  Procedure Laterality Date   APPENDECTOMY     both hells bone spur repair     both heels with metal clips   both shoulder rotator cuff repair     CESAREAN SECTION     x 1   COLONOSCOPY WITH PROPOFOL N/A 09/07/2016   Procedure: COLONOSCOPY WITH PROPOFOL;  Surgeon: Charolett Bumpers, MD;  Location: [facility];  Service: Endoscopy;  Laterality: N/A;   colonscopy  06/2011   polyps   EYE SURGERY     FRACTURE SURGERY     TUBAL LIGATION     VESICO-VAGINAL FISTULA REPAIR     Family History  Problem Relation Age of Onset   Hypertension Mother    COPD Mother    Cancer Mother    Arthritis Mother    Hypertension Father    Hyperlipidemia Father    Diabetes Father    Cancer Father    Alcohol abuse Father    Diabetes Brother    Alcohol abuse Brother    Hypertension Brother    Arthritis Maternal Grandmother    Birth defects Maternal Grandmother    Diabetes Paternal Grandmother    Heart disease Paternal Grandfather    Diabetes Paternal Aunt    Diabetes Paternal Aunt    Obesity Son    Social History   Socioeconomic History   Marital status: Widowed    Spouse name: Not on file   Number of children: Not on file   Years of education: Not on file   Highest education level: Bachelor's degree (e.g., BA, AB, BS)  Occupational History   Not on file  Tobacco Use   Smoking status: Former    Packs/day: 1.00    Years: 14.00    Additional pack years: 0.00    Total pack years: 14.00    Types: Cigarettes   Smokeless tobacco: Never   Tobacco comments:    quit 31 yrs ago  Substance and Sexual Activity   Alcohol use: Yes    Comment: rare   Drug use: No   Sexual activity: Not  Currently    Birth control/protection: Post-menopausal  Other Topics Concern   Not on file  Social History Narrative   Right handed   Caffeine intake none   Social Determinants of Health   Financial Resource Strain: Low Risk  (08/27/2022)   Overall Financial Resource Strain (CARDIA)    Difficulty of Paying Living Expenses: Not hard at all  Food Insecurity: No Food Insecurity (08/27/2022)   Hunger Vital Sign    Worried About  Running Out of Food in the Last Year: Never true    Ran Out of Food in the Last Year: Never true  Transportation Needs: No Transportation Needs (08/27/2022)   PRAPARE - Administrator, Civil Service (Medical): No    Lack of Transportation (Non-Medical): No  Physical Activity: Sufficiently Active (08/27/2022)   Exercise Vital Sign    Days of Exercise per Week: 5 days    Minutes of Exercise per Session: 50 min  Stress: Stress Concern Present (08/27/2022)   [employer] of Occupational Health - Occupational Stress Questionnaire    Feeling of Stress : To some extent  Social Connections: Moderately Integrated (08/27/2022)   Social Connection and Isolation Panel [NHANES]    Frequency of Communication with Friends and Family: More than three times a week    Frequency of Social Gatherings with Friends and Family: More than three times a week    Attends Religious Services: More than 4 times per year    Active Member of [employer] or Organizations: Yes    Attends Banker Meetings: More than 4 times per year    Marital Status: Widowed    Tobacco Counseling Counseling given: Not Answered Tobacco comments: quit 31 yrs ago   Clinical Intake:  Pre-visit preparation completed: Yes  Pain : No/denies pain  BMI - recorded: 37.66 Nutritional Status: BMI > 30  Obese Nutritional Risks: None Diabetes: Yes CBG done?: No Did pt. bring in CBG monitor from home?: No  How often do you need to have someone help you when you read instructions,  pamphlets, or other written materials from your doctor or pharmacy?: 1 - Never   Activities of Daily Living    08/27/2022    5:25 PM  In your present state of health, do you have any difficulty performing the following activities:  Hearing? 1  Comment some slight hearing loss in left ear  Vision? 0  Difficulty concentrating or making decisions? 0  Walking or climbing stairs? 0  Dressing or bathing? 0  Doing errands, shopping? 0  Preparing Food and eating ? N  Using the Toilet? N  In the past six months, have you accidently leaked urine? Y  Do you have problems with loss of bowel control? Y  Managing your Medications? N  Managing your Finances? N  Housekeeping or managing your Housekeeping? N    Patient Care Team: Olive Bass, [name] as PCP - General (Internal Medicine)  Indicate any recent Medical Services you may have received from other than [facility] providers in the past year (date may be approximate).     Assessment:   This is a routine wellness examination for Veronica Galvan.  Hearing/Vision screen No results found.  Dietary issues and exercise activities discussed: Current Exercise Habits: Home exercise routine, Type of exercise: Other - see comments (water aerobics, balance class, physical therapy), Time (Minutes): 50, Frequency (Times/Week): 5, Weekly Exercise (Minutes/Week): 250, Intensity: Moderate, Exercise limited by: None identified   Goals Addressed   None    Depression Screen    08/29/2022   10:27 AM 05/19/2022    8:47 AM 05/19/2022    8:46 AM 02/17/2022    9:18 AM 11/16/2021    8:46 AM 08/19/2021    9:40 AM 08/17/2021    9:22 AM  PHQ 2/9 Scores  PHQ - 2 Score 0 0 0 0 0 0 0  PHQ- 9 Score  0  0  0 6    Fall Risk  08/27/2022    5:25 PM 05/19/2022    8:46 AM 02/17/2022    9:12 AM 11/16/2021    8:46 AM 08/19/2021    9:44 AM  Fall Risk   Falls in the past year? 0 0 1 0 0  Number falls in past yr: 0 0 1 0 0  Injury with Fall? 0 0 0 0 0  Risk for fall  due to : No Fall Risks No Fall Risks History of fall(s)  No Fall Risks  Follow up Falls evaluation completed Falls evaluation completed Falls evaluation completed;Falls prevention discussed  Falls prevention discussed    FALL RISK PREVENTION PERTAINING TO THE HOME:  Any stairs in or around the home? Yes  If so, are there any without handrails? No  Home free of loose throw rugs in walkways, pet beds, electrical cords, etc? Yes  Adequate lighting in your home to reduce risk of falls? Yes   ASSISTIVE DEVICES UTILIZED TO PREVENT FALLS:  Life alert? No  Use of a cane, walker or w/c? No  Grab bars in the bathroom? No  Shower chair or bench in shower?  Built in bench Elevated toilet seat or a handicapped toilet? No   TIMED UP AND GO:  Was the test performed?  No, audio visit .    Cognitive Function:        08/29/2022   10:33 AM 08/19/2021    9:48 AM  6CIT Screen  What Year? 0 points 0 points  What month? 0 points 0 points  What time? 0 points 0 points  Count back from 20 0 points 0 points  Months in reverse 0 points 0 points  Repeat phrase 0 points 0 points  Total Score 0 points 0 points    Immunizations Immunization History  Administered Date(s) Administered   Hepatitis B, PED/ADOLESCENT 09/23/2014, 06/20/2016, 08/28/2017   Influenza-Unspecified 02/07/2021, 01/28/2022   PFIZER(Purple Top)SARS-COV-2 Vaccination 07/20/2019, 08/12/2019, 02/23/2020   Pfizer Covid-19 Vaccine Bivalent Booster 90yrs & up 03/19/2021, 02/02/2022   Pneumococcal Conjugate-13 01/27/2020   Pneumococcal Polysaccharide-23 03/26/2012, 04/16/2021   Pneumococcal-Unspecified 03/26/2012   Tdap 03/31/2016   Zoster Recombinat (Shingrix) 03/05/2019, 05/24/2019   Zoster, Live 03/30/2015    TDAP status: Up to date  Flu Vaccine status: Up to date  Pneumococcal vaccine status: Up to date  Covid-19 vaccine status: Information provided on how to obtain vaccines.   Qualifies for Shingles Vaccine? Yes    Zostavax completed Yes   Shingrix Completed?: Yes  Screening Tests Health Maintenance  Topic Date Due   COVID-19 Vaccine (6 - 2023-24 season) 03/30/2022   Medicare Annual Wellness (AWV)  08/20/2022   FOOT EXAM  10/02/2022   HEMOGLOBIN A1C  11/17/2022   INFLUENZA VACCINE  12/01/2022   OPHTHALMOLOGY EXAM  12/09/2022   Diabetic kidney evaluation - Urine ACR  02/18/2023   Diabetic kidney evaluation - eGFR measurement  05/20/2023   COLONOSCOPY (Pts 45-55yrs Insurance coverage will need to be confirmed)  09/08/2023   MAMMOGRAM  05/30/2024   DTaP/Tdap/Td (2 - Td or Tdap) 03/31/2026   Pneumonia Vaccine 7+ Years old  Completed   DEXA SCAN  Completed   Hepatitis C Screening  Completed   Zoster Vaccines- Shingrix  Completed   HPV VACCINES  Aged Out    Health Maintenance  Health Maintenance Due  Topic Date Due   COVID-19 Vaccine (6 - 2023-24 season) 03/30/2022   Medicare Annual Wellness (AWV)  08/20/2022    Colorectal cancer screening: Type of screening: Colonoscopy. Completed  09/07/16. Repeat every 7 years  Mammogram status: Completed 05/30/22. Repeat every year  Bone Density status: Completed 06/23/20. Results reflect: Bone density results: NORMAL. Repeat every 2 years.  Lung Cancer Screening: (Low Dose CT Chest recommended if Age 80-80 years, 30 pack-year currently smoking OR have quit w/in 15years.) does not qualify.   Additional Screening:  Hepatitis C Screening: does qualify; Completed 04/16/21  Vision Screening: Recommended annual ophthalmology exams for early detection of glaucoma and other disorders of the eye. Is the patient up to date with their annual eye exam?  Yes  Who is the provider or what is the name of the office in which the patient attends annual eye exams? Dr. Olegario Messier [place] If pt is not established with a provider, would they like to be referred to a provider to establish care? No .   Dental Screening: Recommended annual dental exams for proper oral  hygiene  Community Resource Referral / Chronic Care Management: CRR required this visit?  No   CCM required this visit?  No      Plan:     I have personally reviewed and noted the following in the patient's chart:   Medical and social history Use of alcohol, tobacco or illicit drugs  Current medications and supplements including opioid prescriptions. Patient is not currently taking opioid prescriptions. Functional ability and status Nutritional status Physical activity Advanced directives List of other physicians Hospitalizations, surgeries, and ER visits in previous 12 months Vitals Screenings to include cognitive, depression, and falls Referrals and appointments  In addition, I have reviewed and discussed with patient certain preventive protocols, quality metrics, and best practice recommendations. A written personalized care plan for preventive services as well as general preventive health recommendations were provided to patient.   Due to this being a telephonic visit, the after visit summary with patients personalized plan was offered to patient via mail or my-chart. Patient would like to access on my-chart.   Donne Anon, [place]   08/29/2022   Nurse Notes: None

## 2022-08-30 ENCOUNTER — Ambulatory Visit: Payer: Medicare Other | Admitting: Physical Therapy

## 2022-09-01 ENCOUNTER — Ambulatory Visit: Payer: Medicare Other | Attending: Family | Admitting: Physical Therapy

## 2022-09-01 ENCOUNTER — Encounter: Payer: Self-pay | Admitting: Physical Therapy

## 2022-09-01 DIAGNOSIS — G8929 Other chronic pain: Secondary | ICD-10-CM | POA: Diagnosis not present

## 2022-09-01 DIAGNOSIS — M542 Cervicalgia: Secondary | ICD-10-CM | POA: Diagnosis not present

## 2022-09-01 DIAGNOSIS — M6281 Muscle weakness (generalized): Secondary | ICD-10-CM | POA: Diagnosis not present

## 2022-09-01 DIAGNOSIS — R519 Headache, unspecified: Secondary | ICD-10-CM | POA: Diagnosis not present

## 2022-09-01 NOTE — Therapy (Signed)
OUTPATIENT PHYSICAL THERAPY CERVICAL TREATMENT    Patient Name: Veronica Galvan MRN: [id number] DOB:1955-08-02, 67 y.o., female Today's Date: 09/01/2022  PT End of Session - 09/01/22 1317     Visit Number 71    Date for PT Re-Evaluation 09/15/22    PT Start Time 1315    PT Stop Time 1400    PT Time Calculation (min) 45 min    Activity Tolerance Patient tolerated treatment well    Behavior During Therapy WFL for tasks assessed/performed               Past Medical History:  Diagnosis Date   Allergy    Anxiety    Arthritis    hands   Cataract    Diabetes mellitus without complication (HCC)    type 2   Family history of adverse reaction to anesthesia    son has malignant hyperthermia, daughter does not daughter recently had c section without problems   GERD (gastroesophageal reflux disease)    Headache    sinus   Hyperlipidemia    Hypertension    PONV (postoperative nausea and vomiting)    nausea only   Ulcer, stomach peptic yrs ago   Past Surgical History:  Procedure Laterality Date   APPENDECTOMY     both hells bone spur repair     both heels with metal clips   both shoulder rotator cuff repair     CESAREAN SECTION     x 1   COLONOSCOPY WITH PROPOFOL N/A 09/07/2016   Procedure: COLONOSCOPY WITH PROPOFOL;  Surgeon: Charolett Bumpers, MD;  Location: [facility];  Service: Endoscopy;  Laterality: N/A;   colonscopy  06/2011   polyps   EYE SURGERY     FRACTURE SURGERY     TUBAL LIGATION     VESICO-VAGINAL FISTULA REPAIR     Patient Active Problem List   Diagnosis Date Noted   Gastroesophageal reflux disease 11/16/2021   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 08/18/2021   Dermatofibroma 08/18/2021   History of malignant neoplasm of skin 08/18/2021   Lentigo 08/18/2021   Melanocytic nevi of trunk 08/18/2021   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   Rosacea 08/18/2021   Actinic keratosis 08/18/2021   Sensorineural hearing  loss (SNHL) of left ear with unrestricted hearing of right ear 07/26/2021   Tinnitus of left ear 07/26/2021   Acute recurrent maxillary sinusitis 06/22/2021   Primary hypertension 01/12/2021   Hyperlipidemia associated with type 2 diabetes mellitus (HCC) 01/12/2021   Arthritis 01/12/2021   Type II diabetes mellitus (HCC) 11/25/2019   Ingrown toenail 09/05/2018   Neck pain 01/29/2018   Tick bite 10/24/2017    PCP: Ria Clock  REFERRING PROVIDER: Ocie Doyne  REFERRING DIAG: M54.2  THERAPY DIAG:  Cervicalgia  Chronic intractable headache, unspecified headache type  Muscle weakness (generalized)  Rationale for Evaluation and Treatment Rehabilitation  ONSET DATE: 11/08/21  SUBJECTIVE:  SUBJECTIVE STATEMENT: I have been so busy since Monday, has been in [place] and doing some baby sitting PERTINENT HISTORY:  Arthritis, HTN, bilateral rotator cuff surgery 15-35yrs ago, DM  PAIN:  Are you having pain? Yes: NPRS scale: 5/10 Pain location: head and neck, shoulders  Pain description: tight  Aggravating factors: moving Relieving factors: massage, standing in the shower  WEIGHT BEARING RESTRICTIONS No  FALLS:  Has patient fallen in last 6 months? No  LIVING ENVIRONMENT: Lives with: lives alone Lives in: House/apartment Stairs: Yes: External: 3 steps; can reach both Has following equipment at home: None  OCCUPATION: reitred  PLOF: Independent  PATIENT GOALS to loosen it up, find out what is causing my headaches   OBJECTIVE:   DIAGNOSTIC FINDINGS:   FINDINGS: Brain: No acute infarct, mass effect or extra-axial collection. No acute or chronic hemorrhage. Normal white matter signal, parenchymal volume and CSF spaces. The midline structures are normal.  Skull and  upper cervical spine: Normal calvarium and skull base. Visualized upper cervical spine and soft tissues are normal.   Sinuses/Orbits:No paranasal sinus fluid levels or advanced mucosal thickening. No mastoid or middle ear effusion. Normal orbits.   IMPRESSION: Normal brain MRI.  PATIENT SURVEYS:  FOTO 53/100 ,  April 08, 2022 has been up to 69 and today was 60 but has been doing a lot and lifting and is back doing much more than she was.  January 4,2024 57/100 she does report tha tshe wnet backwards from her previous due to her being able to do so much more but is hurting more   COGNITION: Overall cognitive status: Within functional limits for tasks assessed  SENSATION: WFL  POSTURE: rounded shoulders, forward head, and increased thoracic kyphosis  PALPATION: TTP and sore along upper trap and cervical paraspinals, trigger points in R and L UT   CERVICAL ROM:   Active ROM A/PROM (deg) eval 12/30/21 01/05/22 9/28 10/31 04/05/22  Flexion 100% no pain WFL WFL WNL   WNL  Extension Mild limitations w/pain [facility]  WFL WNL   WNL  Right lateral flexion Mod limitation w/pain Mild limitations Mild limitations Severe limitation, leans with trunk  Decreased 75%  Decreased 50%  Left lateral flexion Mod limitation w/pain Mod limitations Mild limitations Severe limitation, leans with trunk  Decreased 75% Decreased  50%  Right rotation 75% w/pain Mild limitation w/pain WFL pain at end range  Mild limitation  Decreased 25%  Decreased 25%  Left rotation 75% w/pain  Mild limitation w/pain  WFL pain at end range  Mild limitation  Decreased 25% Decreased  25%   (Blank rows = not tested)  UPPER EXTREMITY ROM: WFL, limited in IR and ER due to prior RC surgery years ago   UPPER EXTREMITY MMT:  MMT Right eval Left eval Right 01/05/22 Left 01/05/22 R 9/28 L 9/28  Shoulder flexion 4+ 4+ 5 5 5 5   Shoulder extension        Shoulder abduction 4+ very painful 4+ 4+ with pain 5 5 5   Shoulder adduction         Shoulder extension        Shoulder internal rotation 4+ 4+ 5 5 5 5   Shoulder external rotation     5 5  Middle trapezius        Lower trapezius        Elbow flexion 5 5      Elbow extension 5 5      Wrist flexion  Wrist extension        Wrist ulnar deviation        Wrist radial deviation        Wrist pronation        Wrist supination        Grip strength         (Blank rows = not tested) 05/05/22 LE MMT  right 4+/5, left 4/5  SLR 40 degrees with pain in the legs and the back  Cannot cross her legs due to tight piriformis 25 degrees hip ER  Abdominal strength 2/5 unable to sit up or hold at 40 degrees  5x STS 21 seconds 06/14/22 TUG 10 seconds  5XSTS 16 seconds  Right 4+/5, left 4+/5  Manually I can cross her legs fully, she reports able to get   Socks on now, she can on her own without hands get her  Heels to mid patella  SLR to 55 degrees  Can hold plank 10 seconds elbows and toes  Had her A1C go down from 8.2 to 7.8   07/14/22  can hold plank 20 seconds x4  TUG 7 seconds  08/16/22  still very difficult to put socks on, is getting closer to being able to cross leg over.  Heels to superior patella. Weight today was 202#.  5XSTS 11 seconds.  Planks 30 seconds x3 and 20s x 1  TODAY'S TREATMENT:  09/01/22 Bike level 4 with 3 power bursts x 6 minutes up to 350 watts Leg press 60#  On airex 15# straight arm pulls 20# AR press 60# leg press 3 x10 10# hip extension and abduction and flexion On bosu 3# biceps and overhead press Wobble board balance Planks 30s x 2  08/29/22 2 laps brisk pace Leg extension 15# 2x15 LEg curls 35# 3x10 15# straight arm pulls 20# AR press 10# hip extension and hip abduction 10# and 20# sit to stand 25# rows 25# lats Tmill pushes 3x30s Passive HS and piriformis stretch  08/25/22 Gait 2 laps with 3# biceps and punches Seated row 25# Lats 25# 60# leg press 2x10 Worked on getting up from the floor able to do pretty well Planks 2x  30s Elliptical 4 minutes 10# hip extension, abduction and flexion WEight 200.5#  08/23/22 Gait 2 laps around the back building 15# straight arm pulls 25# lats and Rows Tmill push two ways Nustep level 6 x 5 minutes shooting for 350 steps On bosu 3# biceps, punches and abduction 10# hip extension and abduction  08/18/22 UBE level 4 x 6 minutes Rows 25# Lats 25# Black tband extension HS curls 25# 2x15 Leg extension 10# 2x15 On bosu 4# biceps, punches, ball toss and weighted ball  STM to the right upper trap and rhomboid  08/16/22 Walking 2 laps brisk pace Balance on bosu 2 ways, balance, head turns and reaching  Airex plank side stepping and tandem gait LEg press 40# 2x15 PROM of the LE's Assessment as noted above  08/11/22 Weight 201# Elliptical level 4 x 3 minutes Leg curls 35# 2x15 Leg extension 15# 2x15 On airex 15# 2x12 Planks 4 x 20s cues to breath Feet on ball bridges 2x10 HS and piriformis stretch Partial eccentric sit ups 20# trunk axe chop Upper trap and levator stretches   08/09/22 Weight 202# 2 laps outside good pace one rest 15# straight arm pulls 20# AR press 10# hip extension abd, flexion 2x15 25# seated rows 25# lats 10# chest press 40# leg press Passive stretch to the  LE's  07/21/22 Fast walk around the building no rest 10# hip extension, abduction and flexion Sit to stands 10# overhead press on sit fit  10# straight arm pulls 15# AR press 25# rows Black tband extension and abs HS, calf and piriformis stretch  07/19/22 Weight 203# Gait around the back building brisk pace, one short rest 60# leg press 2x15 35# straight arm pulls  35# triceps 5# hips 3 ways Tmill push and pulls 20s x 3 each 3# overhead stick carry Passive HS and piriformis stretch  07/14/22 Weight 204# Fast walk around the back building 10# Hip extension and abduction 15# AR press 15# straight arm pulls Leg press 60# 2x15 Planks 4x20 seconds On bosu balance 3#  alternating work  07/12/22 Weight 207# Brisk walk around the back building without rest and able to carry on a conversation 35# leg curls 10# leg extension Passive stretch LE's Education in calories in and out, carbs, protein and fiber 25# rows 2x15 Lats 25# 2x15 Elliptical 3 minutes level 4   PATIENT EDUCATION:  Education details: POC Person educated: Patient Education method: Explanation Education comprehension: verbalized understanding   HOME EXERCISE PROGRAM: Access Code: [email or web address] URL: [email or web address] Date: 12/01/2021 Prepared by: Cassie Freer   ASSESSMENT:  CLINICAL IMPRESSION: Patient has been very busy today in [place] doing baby sitting and then drove here and [place] drive back after this, she continues to want to be pushed but also reports that with her being so busy she has much more difficulty with trying to stick to and maintain the better choices with her eating   REHAB POTENTIAL: Good  CLINICAL DECISION MAKING: Stable/uncomplicated  EVALUATION COMPLEXITY: Low   GOALS: Goals reviewed with patient? Yes  SHORT TERM GOALS: Target date: 12/29/21  Patient Galvan be independent with initial HEP.  Goal status: MET  LONG TERM GOALS: Target date: 02/26/22  Patient Galvan be independent with advanced/ongoing HEP to improve outcomes and carryover.  Goal status: continuing 08/16/22  2.  Patient Galvan report 75% improvement in neck pain to improve QOL.   Baseline: 9/28- not even 50% improvement maybe 40% Goal status: met  3.  Patient Galvan demonstrate full pain free cervical ROM to complete ADLs and play with grandchildren with more ease. Goal status: met 4.  Patient Galvan demonstrate improved posture to decrease muscle imbalance and tightness.  Baseline: increased tightness along cervical paraspinals Goal status: progressing still tight piriformis 08/16/22  5.  Patient Galvan report 63 on FOTO to demonstrate improved functional ability.  Baseline:   9/28- 64  Goal status: MET but has regressed as she is doing more and more  6.  Galvan demonstrate improved core strength as evidenced by passing double leg lower test  Baseline:  Goal status: progressing 08/16/22 7.  Average 11,000 steps a day on her fit bit over a month period.  Met 08/25/22   PLAN: PT FREQUENCY: 2x/week  PT DURATION: 4 weeks  PLANNED INTERVENTIONS: Therapeutic exercises, Therapeutic activity, Neuromuscular re-education, Balance training, Gait training, Patient/Family education, Self Care, Joint mobilization, Dry Needling, Spinal manipulation, Spinal mobilization, Cryotherapy, Moist heat, Taping, Vasopneumatic device, Traction, Ionotophoresis 4mg /ml Dexamethasone, and Manual therapy  PLAN FOR NEXT SESSION:  continue to progress Stacie Glaze, PT 09/01/2022, 1:17 PM   [facility] [address] [facility]. [place], [place], [address] Phone: [phone]   Fax:  [phone]

## 2022-09-06 ENCOUNTER — Encounter: Payer: Self-pay | Admitting: Physical Therapy

## 2022-09-06 ENCOUNTER — Ambulatory Visit: Payer: Medicare Other | Admitting: Physical Therapy

## 2022-09-06 DIAGNOSIS — G8929 Other chronic pain: Secondary | ICD-10-CM | POA: Diagnosis not present

## 2022-09-06 DIAGNOSIS — M6281 Muscle weakness (generalized): Secondary | ICD-10-CM | POA: Diagnosis not present

## 2022-09-06 DIAGNOSIS — M542 Cervicalgia: Secondary | ICD-10-CM

## 2022-09-06 DIAGNOSIS — R519 Headache, unspecified: Secondary | ICD-10-CM | POA: Diagnosis not present

## 2022-09-06 NOTE — Therapy (Signed)
OUTPATIENT PHYSICAL THERAPY CERVICAL TREATMENT    Patient Name: Veronica Galvan MRN: [id number] DOB:12/16/1955, 67 y.o., female Today's Date: 09/06/2022  PT End of Session - 09/06/22 0853     Visit Number 72    Date for PT Re-Evaluation 09/15/22    PT Start Time 0845    PT Stop Time 0930    PT Time Calculation (min) 45 min    Activity Tolerance Patient tolerated treatment well    Behavior During Therapy [facility] for tasks assessed/performed               Past Medical History:  Diagnosis Date   Allergy    Anxiety    Arthritis    hands   Cataract    Diabetes mellitus without complication (HCC)    type 2   Family history of adverse reaction to anesthesia    son has malignant hyperthermia, daughter does not daughter recently had c section without problems   GERD (gastroesophageal reflux disease)    Headache    sinus   Hyperlipidemia    Hypertension    PONV (postoperative nausea and vomiting)    nausea only   Ulcer, stomach peptic yrs ago   Past Surgical History:  Procedure Laterality Date   APPENDECTOMY     both hells bone spur repair     both heels with metal clips   both shoulder rotator cuff repair     CESAREAN SECTION     x 1   COLONOSCOPY WITH PROPOFOL N/A 09/07/2016   Procedure: COLONOSCOPY WITH PROPOFOL;  Surgeon: Charolett Bumpers, MD;  Location: [facility];  Service: Endoscopy;  Laterality: N/A;   colonscopy  06/2011   polyps   EYE SURGERY     FRACTURE SURGERY     TUBAL LIGATION     VESICO-VAGINAL FISTULA REPAIR     Patient Active Problem List   Diagnosis Date Noted   Gastroesophageal reflux disease 11/16/2021   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 08/18/2021   Dermatofibroma 08/18/2021   History of malignant neoplasm of skin 08/18/2021   Lentigo 08/18/2021   Melanocytic nevi of trunk 08/18/2021   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   Rosacea 08/18/2021   Actinic keratosis 08/18/2021   Sensorineural hearing  loss (SNHL) of left ear with unrestricted hearing of right ear 07/26/2021   Tinnitus of left ear 07/26/2021   Acute recurrent maxillary sinusitis 06/22/2021   Primary hypertension 01/12/2021   Hyperlipidemia associated with type 2 diabetes mellitus (HCC) 01/12/2021   Arthritis 01/12/2021   Type II diabetes mellitus (HCC) 11/25/2019   Ingrown toenail 09/05/2018   Neck pain 01/29/2018   Tick bite 10/24/2017    PCP: Ria Clock  REFERRING PROVIDER: Ocie Doyne  REFERRING DIAG: M54.2  THERAPY DIAG:  Cervicalgia  Chronic intractable headache, unspecified headache type  Muscle weakness (generalized)  Rationale for Evaluation and Treatment Rehabilitation  ONSET DATE: 11/08/21  SUBJECTIVE:  SUBJECTIVE STATEMENT: Patient extremely busy last week, reports that she was out of pocket and really did not do all her exercises and did not eat right but does report trying to make better decisions PERTINENT HISTORY:  Arthritis, HTN, bilateral rotator cuff surgery 15-64yrs ago, DM  PAIN:  Are you having pain? Yes: NPRS scale: 6/10 Pain location: head and neck, shoulders  Pain description: tight  Aggravating factors: moving Relieving factors: massage, standing in the shower  WEIGHT BEARING RESTRICTIONS No  FALLS:  Has patient fallen in last 6 months? No  LIVING ENVIRONMENT: Lives with: lives alone Lives in: House/apartment Stairs: Yes: External: 3 steps; can reach both Has following equipment at home: None  OCCUPATION: reitred  PLOF: Independent  PATIENT GOALS to loosen it up, find out what is causing my headaches   OBJECTIVE:   DIAGNOSTIC FINDINGS:   FINDINGS: Brain: No acute infarct, mass effect or extra-axial collection. No acute or chronic hemorrhage. Normal white  matter signal, parenchymal volume and CSF spaces. The midline structures are normal.  Skull and upper cervical spine: Normal calvarium and skull base. Visualized upper cervical spine and soft tissues are normal.   Sinuses/Orbits:No paranasal sinus fluid levels or advanced mucosal thickening. No mastoid or middle ear effusion. Normal orbits.   IMPRESSION: Normal brain MRI.  PATIENT SURVEYS:  FOTO 53/100 ,  April 08, 2022 has been up to 20 and today was 38 but has been doing a lot and lifting and is back doing much more than she was.  January 4,2024 57/100 she does report tha tshe wnet backwards from her previous due to her being able to do so much more but is hurting more   COGNITION: Overall cognitive status: Within functional limits for tasks assessed  SENSATION: WFL  POSTURE: rounded shoulders, forward head, and increased thoracic kyphosis  PALPATION: TTP and sore along upper trap and cervical paraspinals, trigger points in R and L UT   CERVICAL ROM:   Active ROM A/PROM (deg) eval 12/30/21 01/05/22 9/28 10/31 04/05/22  Flexion 100% no pain WFL WFL WNL   WNL  Extension Mild limitations w/pain [facility]  WFL WNL   WNL  Right lateral flexion Mod limitation w/pain Mild limitations Mild limitations Severe limitation, leans with trunk  Decreased 75%  Decreased 50%  Left lateral flexion Mod limitation w/pain Mod limitations Mild limitations Severe limitation, leans with trunk  Decreased 75% Decreased  50%  Right rotation 75% w/pain Mild limitation w/pain WFL pain at end range  Mild limitation  Decreased 25%  Decreased 25%  Left rotation 75% w/pain  Mild limitation w/pain  WFL pain at end range  Mild limitation  Decreased 25% Decreased  25%   (Blank rows = not tested)  UPPER EXTREMITY ROM: WFL, limited in IR and ER due to prior RC surgery years ago   UPPER EXTREMITY MMT:  MMT Right eval Left eval Right 01/05/22 Left 01/05/22 R 9/28 L 9/28  Shoulder flexion 4+ 4+ 5 5 5 5   Shoulder  extension        Shoulder abduction 4+ very painful 4+ 4+ with pain 5 5 5   Shoulder adduction        Shoulder extension        Shoulder internal rotation 4+ 4+ 5 5 5 5   Shoulder external rotation     5 5  Middle trapezius        Lower trapezius        Elbow flexion 5 5  Elbow extension 5 5      Wrist flexion        Wrist extension        Wrist ulnar deviation        Wrist radial deviation        Wrist pronation        Wrist supination        Grip strength         (Blank rows = not tested) 05/05/22 LE MMT  right 4+/5, left 4/5  SLR 40 degrees with pain in the legs and the back  Cannot cross her legs due to tight piriformis 25 degrees hip ER  Abdominal strength 2/5 unable to sit up or hold at 40 degrees  5x STS 21 seconds 06/14/22 TUG 10 seconds  5XSTS 16 seconds  Right 4+/5, left 4+/5  Manually I can cross her legs fully, she reports able to get   Socks on now, she can on her own without hands get her  Heels to mid patella  SLR to 55 degrees  Can hold plank 10 seconds elbows and toes  Had her A1C go down from 8.2 to 7.8   07/14/22  can hold plank 20 seconds x4  TUG 7 seconds  08/16/22  still very difficult to put socks on, is getting closer to being able to cross leg over.  Heels to superior patella. Weight today was 202#.  5XSTS 11 seconds.  Planks 30 seconds x3 and 20s x 1  TODAY'S TREATMENT:  09/06/22 UBE level 4 x 6 minutes Gait 2 laps brisk pace no rest 60# leg press 3x10 35# leg curls 2x10 10# leg extension 2x10 50# resisted gait all directions Bike level 4 x 6 minutes with 3 power bursts 500watts, 400 watts, 455 watts On bosu balance and reaching and then squats 20# trunk chops 2x10  09/01/22 Bike level 4 with 3 power bursts x 6 minutes up to 350 watts Leg press 60#  On airex 15# straight arm pulls 20# AR press 60# leg press 3 x10 10# hip extension and abduction and flexion On bosu 3# biceps and overhead press Wobble board balance Planks 30s x 2  08/29/22 2  laps brisk pace Leg extension 15# 2x15 LEg curls 35# 3x10 15# straight arm pulls 20# AR press 10# hip extension and hip abduction 10# and 20# sit to stand 25# rows 25# lats Tmill pushes 3x30s Passive HS and piriformis stretch  08/25/22 Gait 2 laps with 3# biceps and punches Seated row 25# Lats 25# 60# leg press 2x10 Worked on getting up from the floor able to do pretty well Planks 2x 30s Elliptical 4 minutes 10# hip extension, abduction and flexion WEight 200.5#  08/23/22 Gait 2 laps around the back building 15# straight arm pulls 25# lats and Rows Tmill push two ways Nustep level 6 x 5 minutes shooting for 350 steps On bosu 3# biceps, punches and abduction 10# hip extension and abduction  08/18/22 UBE level 4 x 6 minutes Rows 25# Lats 25# Black tband extension HS curls 25# 2x15 Leg extension 10# 2x15 On bosu 4# biceps, punches, ball toss and weighted ball  STM to the right upper trap and rhomboid  08/16/22 Walking 2 laps brisk pace Balance on bosu 2 ways, balance, head turns and reaching  Airex plank side stepping and tandem gait LEg press 40# 2x15 PROM of the LE's Assessment as noted above  08/11/22 Weight 201# Elliptical level 4 x 3 minutes Leg curls 35# 2x15  Leg extension 15# 2x15 On airex 15# 2x12 Planks 4 x 20s cues to breath Feet on ball bridges 2x10 HS and piriformis stretch Partial eccentric sit ups 20# trunk axe chop Upper trap and levator stretches   08/09/22 Weight 202# 2 laps outside good pace one rest 15# straight arm pulls 20# AR press 10# hip extension abd, flexion 2x15 25# seated rows 25# lats 10# chest press 40# leg press Passive stretch to the LE's  07/21/22 Fast walk around the building no rest 10# hip extension, abduction and flexion Sit to stands 10# overhead press on sit fit  10# straight arm pulls 15# AR press 25# rows Black tband extension and abs HS, calf and piriformis stretch  07/19/22 Weight 203# Gait around  the back building brisk pace, one short rest 60# leg press 2x15 35# straight arm pulls  35# triceps 5# hips 3 ways Tmill push and pulls 20s x 3 each 3# overhead stick carry Passive HS and piriformis stretch  07/14/22 Weight 204# Fast walk around the back building 10# Hip extension and abduction 15# AR press 15# straight arm pulls Leg press 60# 2x15 Planks 4x20 seconds On bosu balance 3# alternating work  07/12/22 Weight 207# Brisk walk around the back building without rest and able to carry on a conversation 35# leg curls 10# leg extension Passive stretch LE's Education in calories in and out, carbs, protein and fiber 25# rows 2x15 Lats 25# 2x15 Elliptical 3 minutes level 4   PATIENT EDUCATION:  Education details: POC Person educated: Patient Education method: Explanation Education comprehension: verbalized understanding   HOME EXERCISE PROGRAM: Access Code: [email or web address] URL: [email or web address] Date: 12/01/2021 Prepared by: Cassie Freer   ASSESSMENT:  CLINICAL IMPRESSION: Patientdoing well has a HA today but she continues on.  She continues to push herself, struggles at times with endurance and strength, LE's still very tight  REHAB POTENTIAL: Good  CLINICAL DECISION MAKING: Stable/uncomplicated  EVALUATION COMPLEXITY: Low   GOALS: Goals reviewed with patient? Yes  SHORT TERM GOALS: Target date: 12/29/21  Patient will be independent with initial HEP.  Goal status: MET  LONG TERM GOALS: Target date: 02/26/22  Patient will be independent with advanced/ongoing HEP to improve outcomes and carryover.  Goal status: continuing 08/16/22  2.  Patient will report 75% improvement in neck pain to improve QOL.   Baseline: 9/28- not even 50% improvement maybe 40% Goal status: met  3.  Patient will demonstrate full pain free cervical ROM to complete ADLs and play with grandchildren with more ease. Goal status: met 4.  Patient will demonstrate  improved posture to decrease muscle imbalance and tightness.  Baseline: increased tightness along cervical paraspinals Goal status: progressing still tight piriformis 08/16/22  5.  Patient will report 41 on FOTO to demonstrate improved functional ability.  Baseline:  9/28- 64  Goal status: MET but has regressed as she is doing more and more  6.  Will demonstrate improved core strength as evidenced by passing double leg lower test  Baseline:  Goal status: progressing 08/16/22 7.  Average 11,000 steps a day on her fit bit over a month period.  Met 08/25/22   PLAN: PT FREQUENCY: 2x/week  PT DURATION: 4 weeks  PLANNED INTERVENTIONS: Therapeutic exercises, Therapeutic activity, Neuromuscular re-education, Balance training, Gait training, Patient/Family education, Self Care, Joint mobilization, Dry Needling, Spinal manipulation, Spinal mobilization, Cryotherapy, Moist heat, Taping, Vasopneumatic device, Traction, Ionotophoresis 4mg /ml Dexamethasone, and Manual therapy  PLAN FOR NEXT SESSION:  continue to progress, assess Stacie Glaze, PT  09/06/2022, 8:54 AM   [facility] [address] [facility] [address] [place], [place], [address] Phone: [phone]   Fax:  [phone]

## 2022-09-08 ENCOUNTER — Ambulatory Visit: Payer: Medicare Other | Admitting: Physical Therapy

## 2022-09-08 ENCOUNTER — Encounter: Payer: Self-pay | Admitting: Physical Therapy

## 2022-09-08 DIAGNOSIS — M542 Cervicalgia: Secondary | ICD-10-CM | POA: Diagnosis not present

## 2022-09-08 DIAGNOSIS — R519 Headache, unspecified: Secondary | ICD-10-CM | POA: Diagnosis not present

## 2022-09-08 DIAGNOSIS — G8929 Other chronic pain: Secondary | ICD-10-CM

## 2022-09-08 DIAGNOSIS — M6281 Muscle weakness (generalized): Secondary | ICD-10-CM | POA: Diagnosis not present

## 2022-09-08 NOTE — Therapy (Signed)
OUTPATIENT PHYSICAL THERAPY CERVICAL TREATMENT    Patient Name: Veronica Galvan MRN: [id number] DOB:[birth year], 67 y.o., female Today's Date: 09/08/2022  PT End of Session - 09/08/22 0911     Visit Number 73    Date for PT Re-Evaluation 09/15/22    PT Start Time 0844    PT Stop Time 0930    PT Time Calculation (min) 46 min    Activity Tolerance Patient tolerated treatment well    Behavior During Therapy [facility] for tasks assessed/performed               Past Medical History:  Diagnosis Date   Allergy    Anxiety    Arthritis    hands   Cataract    Diabetes mellitus without complication (HCC)    type 2   Family history of adverse reaction to anesthesia    son has malignant hyperthermia, daughter does not daughter recently had c section without problems   GERD (gastroesophageal reflux disease)    Headache    sinus   Hyperlipidemia    Hypertension    PONV (postoperative nausea and vomiting)    nausea only   Ulcer, stomach peptic yrs ago   Past Surgical History:  Procedure Laterality Date   APPENDECTOMY     both hells bone spur repair     both heels with metal clips   both shoulder rotator cuff repair     CESAREAN SECTION     x 1   COLONOSCOPY WITH PROPOFOL N/A 09/07/2016   Procedure: COLONOSCOPY WITH PROPOFOL;  Surgeon: Charolett Bumpers, MD;  Location: [facility];  Service: Endoscopy;  Laterality: N/A;   colonscopy  06/2011   polyps   EYE SURGERY     FRACTURE SURGERY     TUBAL LIGATION     VESICO-VAGINAL FISTULA REPAIR     Patient Active Problem List   Diagnosis Date Noted   Gastroesophageal reflux disease 11/16/2021   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 08/18/2021   Dermatofibroma 08/18/2021   History of malignant neoplasm of skin 08/18/2021   Lentigo 08/18/2021   Melanocytic nevi of trunk 08/18/2021   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   Rosacea 08/18/2021   Actinic keratosis 08/18/2021   Sensorineural hearing  loss (SNHL) of left ear with unrestricted hearing of right ear 07/26/2021   Tinnitus of left ear 07/26/2021   Acute recurrent maxillary sinusitis 06/22/2021   Primary hypertension 01/12/2021   Hyperlipidemia associated with type 2 diabetes mellitus (HCC) 01/12/2021   Arthritis 01/12/2021   Type II diabetes mellitus (HCC) 11/25/2019   Ingrown toenail 09/05/2018   Neck pain 01/29/2018   Tick bite 10/24/2017    PCP: Ria Clock  REFERRING PROVIDER: Ocie Doyne  REFERRING DIAG: M54.2  THERAPY DIAG:  Cervicalgia  Chronic intractable headache, unspecified headache type  Muscle weakness (generalized)  Rationale for Evaluation and Treatment Rehabilitation  ONSET DATE: 11/08/21  SUBJECTIVE:  SUBJECTIVE STATEMENT: Continues to be busy, still not eating the best, but is doing better with her decisions when out  WEight today 201# PERTINENT HISTORY:  Arthritis, HTN, bilateral rotator cuff surgery 15-3yrs ago, DM  PAIN:  Are you having pain? Yes: NPRS scale: 6/10 Pain location: head and neck, shoulders  Pain description: tight  Aggravating factors: moving Relieving factors: massage, standing in the shower  WEIGHT BEARING RESTRICTIONS No  FALLS:  Has patient fallen in last 6 months? No  LIVING ENVIRONMENT: Lives with: lives alone Lives in: House/apartment Stairs: Yes: External: 3 steps; can reach both Has following equipment at home: None  OCCUPATION: reitred  PLOF: Independent  PATIENT GOALS to loosen it up, find out what is causing my headaches   OBJECTIVE:   DIAGNOSTIC FINDINGS:   FINDINGS: Brain: No acute infarct, mass effect or extra-axial collection. No acute or chronic hemorrhage. Normal white matter signal, parenchymal volume and CSF spaces. The midline  structures are normal.  Skull and upper cervical spine: Normal calvarium and skull base. Visualized upper cervical spine and soft tissues are normal.   Sinuses/Orbits:No paranasal sinus fluid levels or advanced mucosal thickening. No mastoid or middle ear effusion. Normal orbits.   IMPRESSION: Normal brain MRI.  PATIENT SURVEYS:  FOTO 53/100 ,  April 08, 2022 has been up to 46 and today was 52 but has been doing a lot and lifting and is back doing much more than she was.  January 4,2024 57/100 she does report tha tshe wnet backwards from her previous due to her being able to do so much more but is hurting more   COGNITION: Overall cognitive status: Within functional limits for tasks assessed  SENSATION: WFL  POSTURE: rounded shoulders, forward head, and increased thoracic kyphosis  PALPATION: TTP and sore along upper trap and cervical paraspinals, trigger points in R and L UT   CERVICAL ROM:   Active ROM A/PROM (deg) eval 12/30/21 01/05/22 9/28 10/31 04/05/22  Flexion 100% no pain WFL WFL WNL   WNL  Extension Mild limitations w/pain [facility]  WFL WNL   WNL  Right lateral flexion Mod limitation w/pain Mild limitations Mild limitations Severe limitation, leans with trunk  Decreased 75%  Decreased 50%  Left lateral flexion Mod limitation w/pain Mod limitations Mild limitations Severe limitation, leans with trunk  Decreased 75% Decreased  50%  Right rotation 75% w/pain Mild limitation w/pain WFL pain at end range  Mild limitation  Decreased 25%  Decreased 25%  Left rotation 75% w/pain  Mild limitation w/pain  WFL pain at end range  Mild limitation  Decreased 25% Decreased  25%   (Blank rows = not tested)  UPPER EXTREMITY ROM: WFL, limited in IR and ER due to prior RC surgery years ago   UPPER EXTREMITY MMT:  MMT Right eval Left eval Right 01/05/22 Left 01/05/22 R 9/28 L 9/28  Shoulder flexion 4+ 4+ 5 5 5 5   Shoulder extension        Shoulder abduction 4+ very painful 4+ 4+ with  pain 5 5 5   Shoulder adduction        Shoulder extension        Shoulder internal rotation 4+ 4+ 5 5 5 5   Shoulder external rotation     5 5  Middle trapezius        Lower trapezius        Elbow flexion 5 5      Elbow extension 5 5      Wrist  flexion        Wrist extension        Wrist ulnar deviation        Wrist radial deviation        Wrist pronation        Wrist supination        Grip strength         (Blank rows = not tested) 05/05/22 LE MMT  right 4+/5, left 4/5  SLR 40 degrees with pain in the legs and the back  Cannot cross her legs due to tight piriformis 25 degrees hip ER  Abdominal strength 2/5 unable to sit up or hold at 40 degrees  5x STS 21 seconds 06/14/22 TUG 10 seconds  5XSTS 16 seconds  Right 4+/5, left 4+/5  Manually I can cross her legs fully, she reports able to get   Socks on now, she can on her own without hands get her  Heels to mid patella  SLR to 55 degrees  Can hold plank 10 seconds elbows and toes  Had her A1C go down from 8.2 to 7.8   07/14/22  can hold plank 20 seconds x4  TUG 7 seconds  08/16/22  still very difficult to put socks on, is getting closer to being able to cross leg over.  Heels to superior patella. Weight today was 202#.  5XSTS 11 seconds.  Planks 30 seconds x3 and 20s x 1  TODAY'S TREATMENT:  09/08/22 UBE level 4 x 6 minutes 2 laps brisk walk Pushing tmill 3x30seconds 25# rows 25# lats 10# trunk rotation from low to high 20# trunk chops PAssive LE stretches  09/06/22 UBE level 4 x 6 minutes Gait 2 laps brisk pace no rest 60# leg press 3x10 35# leg curls 2x10 10# leg extension 2x10 50# resisted gait all directions Bike level 4 x 6 minutes with 3 power bursts 500watts, 400 watts, 455 watts On bosu balance and reaching and then squats 20# trunk chops 2x10  09/01/22 Bike level 4 with 3 power bursts x 6 minutes up to 350 watts Leg press 60#  On airex 15# straight arm pulls 20# AR press 60# leg press 3 x10 10# hip extension  and abduction and flexion On bosu 3# biceps and overhead press Wobble board balance Planks 30s x 2  08/29/22 2 laps brisk pace Leg extension 15# 2x15 LEg curls 35# 3x10 15# straight arm pulls 20# AR press 10# hip extension and hip abduction 10# and 20# sit to stand 25# rows 25# lats Tmill pushes 3x30s Passive HS and piriformis stretch  08/25/22 Gait 2 laps with 3# biceps and punches Seated row 25# Lats 25# 60# leg press 2x10 Worked on getting up from the floor able to do pretty well Planks 2x 30s Elliptical 4 minutes 10# hip extension, abduction and flexion WEight 200.5#  08/23/22 Gait 2 laps around the back building 15# straight arm pulls 25# lats and Rows Tmill push two ways Nustep level 6 x 5 minutes shooting for 350 steps On bosu 3# biceps, punches and abduction 10# hip extension and abduction  08/18/22 UBE level 4 x 6 minutes Rows 25# Lats 25# Black tband extension HS curls 25# 2x15 Leg extension 10# 2x15 On bosu 4# biceps, punches, ball toss and weighted ball  STM to the right upper trap and rhomboid  08/16/22 Walking 2 laps brisk pace Balance on bosu 2 ways, balance, head turns and reaching  Airex plank side stepping and tandem gait LEg press 40# 2x15  PROM of the LE's Assessment as noted above  08/11/22 Weight 201# Elliptical level 4 x 3 minutes Leg curls 35# 2x15 Leg extension 15# 2x15 On airex 15# 2x12 Planks 4 x 20s cues to breath Feet on ball bridges 2x10 HS and piriformis stretch Partial eccentric sit ups 20# trunk axe chop Upper trap and levator stretches   08/09/22 Weight 202# 2 laps outside good pace one rest 15# straight arm pulls 20# AR press 10# hip extension abd, flexion 2x15 25# seated rows 25# lats 10# chest press 40# leg press Passive stretch to the LE's  07/21/22 Fast walk around the building no rest 10# hip extension, abduction and flexion Sit to stands 10# overhead press on sit fit  10# straight arm pulls 15# AR  press 25# rows Black tband extension and abs HS, calf and piriformis stretch  07/19/22 Weight 203# Gait around the back building brisk pace, one short rest 60# leg press 2x15 35# straight arm pulls  35# triceps 5# hips 3 ways Tmill push and pulls 20s x 3 each 3# overhead stick carry Passive HS and piriformis stretch  07/14/22 Weight 204# Fast walk around the back building 10# Hip extension and abduction 15# AR press 15# straight arm pulls Leg press 60# 2x15 Planks 4x20 seconds On bosu balance 3# alternating work  07/12/22 Weight 207# Brisk walk around the back building without rest and able to carry on a conversation 35# leg curls 10# leg extension Passive stretch LE's Education in calories in and out, carbs, protein and fiber 25# rows 2x15 Lats 25# 2x15 Elliptical 3 minutes level 4   PATIENT EDUCATION:  Education details: POC Person educated: Patient Education method: Explanation Education comprehension: verbalized understanding   HOME EXERCISE PROGRAM: Access Code: [email or web address] URL: [email or web address] Date: 12/01/2021 Prepared by: Cassie Freer   ASSESSMENT:  CLINICAL IMPRESSION: Patient no issues today, continue to push her abilities with endurance and strength, she is maintaining and reports things are easier for her at home with dressing and getting up and down from sitting and bed.  REHAB POTENTIAL: Good  CLINICAL DECISION MAKING: Stable/uncomplicated  EVALUATION COMPLEXITY: Low   GOALS: Goals reviewed with patient? Yes  SHORT TERM GOALS: Target date: 12/29/21  Patient will be independent with initial HEP.  Goal status: MET  LONG TERM GOALS: Target date: 02/26/22  Patient will be independent with advanced/ongoing HEP to improve outcomes and carryover.  Goal status: continuing 08/16/22  2.  Patient will report 75% improvement in neck pain to improve QOL.   Baseline: 9/28- not even 50% improvement maybe 40% Goal status: met  3.   Patient will demonstrate full pain free cervical ROM to complete ADLs and play with grandchildren with more ease. Goal status: met 4.  Patient will demonstrate improved posture to decrease muscle imbalance and tightness.  Baseline: increased tightness along cervical paraspinals Goal status: progressing still tight piriformis 08/16/22  5.  Patient will report 67 on FOTO to demonstrate improved functional ability.  Baseline:  9/28- 64  Goal status: MET but has regressed as she is doing more and more  6.  Will demonstrate improved core strength as evidenced by passing double leg lower test  Baseline:  Goal status: progressing 08/16/22 7.  Average 11,000 steps a day on her fit bit over a month period.  Met 08/25/22   PLAN: PT FREQUENCY: 2x/week  PT DURATION: 4 weeks  PLANNED INTERVENTIONS: Therapeutic exercises, Therapeutic activity, Neuromuscular re-education, Balance training, Gait training, Patient/Family education, Self Care, Joint mobilization, Dry  Needling, Spinal manipulation, Spinal mobilization, Cryotherapy, Moist heat, Taping, Vasopneumatic device, Traction, Ionotophoresis 4mg /ml Dexamethasone, and Manual therapy  PLAN FOR NEXT SESSION:  continue to progress, assess Stacie Glaze, PT 09/08/2022, 9:12 AM   [facility] [address] [facility] [place], [place], [address] Phone: [phone]   Fax:  [phone]

## 2022-09-13 ENCOUNTER — Other Ambulatory Visit: Payer: Self-pay | Admitting: Family

## 2022-09-13 ENCOUNTER — Encounter: Payer: Self-pay | Admitting: Family

## 2022-09-13 ENCOUNTER — Ambulatory Visit: Payer: Medicare Other | Admitting: Physical Therapy

## 2022-09-13 ENCOUNTER — Encounter: Payer: Self-pay | Admitting: Physical Therapy

## 2022-09-13 ENCOUNTER — Ambulatory Visit (INDEPENDENT_AMBULATORY_CARE_PROVIDER_SITE_OTHER): Payer: Medicare Other | Admitting: Family

## 2022-09-13 VITALS — BP 134/78 | HR 88 | Ht 61.0 in | Wt 200.4 lb

## 2022-09-13 DIAGNOSIS — M6281 Muscle weakness (generalized): Secondary | ICD-10-CM

## 2022-09-13 DIAGNOSIS — Z13820 Encounter for screening for osteoporosis: Secondary | ICD-10-CM

## 2022-09-13 DIAGNOSIS — Z13220 Encounter for screening for lipoid disorders: Secondary | ICD-10-CM | POA: Diagnosis not present

## 2022-09-13 DIAGNOSIS — E2839 Other primary ovarian failure: Secondary | ICD-10-CM

## 2022-09-13 DIAGNOSIS — M542 Cervicalgia: Secondary | ICD-10-CM | POA: Diagnosis not present

## 2022-09-13 DIAGNOSIS — E559 Vitamin D deficiency, unspecified: Secondary | ICD-10-CM

## 2022-09-13 DIAGNOSIS — Z0000 Encounter for general adult medical examination without abnormal findings: Secondary | ICD-10-CM | POA: Diagnosis not present

## 2022-09-13 DIAGNOSIS — G8929 Other chronic pain: Secondary | ICD-10-CM | POA: Diagnosis not present

## 2022-09-13 DIAGNOSIS — Z7984 Long term (current) use of oral hypoglycemic drugs: Secondary | ICD-10-CM

## 2022-09-13 DIAGNOSIS — E119 Type 2 diabetes mellitus without complications: Secondary | ICD-10-CM | POA: Diagnosis not present

## 2022-09-13 DIAGNOSIS — R519 Headache, unspecified: Secondary | ICD-10-CM | POA: Diagnosis not present

## 2022-09-13 LAB — CBC WITH DIFFERENTIAL/PLATELET
Basophils Absolute: 0.1 10*3/uL (ref 0.0–0.1)
Basophils Relative: 1 % (ref 0.0–3.0)
Eosinophils Absolute: 0.5 10*3/uL (ref 0.0–0.7)
Eosinophils Relative: 6.5 % — ABNORMAL HIGH (ref 0.0–5.0)
HCT: 43.1 % (ref 36.0–46.0)
Hemoglobin: 13.8 g/dL (ref 12.0–15.0)
Lymphocytes Relative: 31.4 % (ref 12.0–46.0)
Lymphs Abs: 2.3 10*3/uL (ref 0.7–4.0)
MCHC: 32 g/dL (ref 30.0–36.0)
MCV: 81.8 fl (ref 78.0–100.0)
Monocytes Absolute: 0.5 10*3/uL (ref 0.1–1.0)
Monocytes Relative: 6.5 % (ref 3.0–12.0)
Neutro Abs: 4 10*3/uL (ref 1.4–7.7)
Neutrophils Relative %: 54.6 % (ref 43.0–77.0)
Platelets: 266 10*3/uL (ref 150.0–400.0)
RBC: 5.27 Mil/uL — ABNORMAL HIGH (ref 3.87–5.11)
RDW: 15.8 % — ABNORMAL HIGH (ref 11.5–15.5)
WBC: 7.3 10*3/uL (ref 4.0–10.5)

## 2022-09-13 LAB — VITAMIN D 25 HYDROXY (VIT D DEFICIENCY, FRACTURES): VITD: 25.69 ng/mL — ABNORMAL LOW (ref 30.00–100.00)

## 2022-09-13 LAB — LIPID PANEL
Cholesterol: 128 mg/dL (ref 0–200)
HDL: 44.5 mg/dL (ref >39.00)
NonHDL: 83.58
Total CHOL/HDL Ratio: 3
Triglycerides: 285 mg/dL — ABNORMAL HIGH (ref 0.0–149.0)
VLDL: 57 mg/dL — ABNORMAL HIGH (ref 0.0–40.0)

## 2022-09-13 LAB — COMPREHENSIVE METABOLIC PANEL
ALT: 33 U/L (ref 0–35)
AST: 22 U/L (ref 0–37)
Albumin: 4.5 g/dL (ref 3.5–5.2)
Alkaline Phosphatase: 66 U/L (ref 39–117)
BUN: 19 mg/dL (ref 6–23)
CO2: 28 mEq/L (ref 19–32)
Calcium: 9.8 mg/dL (ref 8.4–10.5)
Chloride: 99 mEq/L (ref 96–112)
Creatinine, Ser: 0.61 mg/dL (ref 0.40–1.20)
GFR: 92.29 mL/min (ref >60.00)
Glucose, Bld: 117 mg/dL — ABNORMAL HIGH (ref 70–99)
Potassium: 4.2 mEq/L (ref 3.5–5.1)
Sodium: 138 mEq/L (ref 135–145)
Total Bilirubin: 0.3 mg/dL (ref 0.2–1.2)
Total Protein: 7.4 g/dL (ref 6.0–8.3)

## 2022-09-13 LAB — HEMOGLOBIN A1C: Hgb A1c MFr Bld: 7.1 % — ABNORMAL HIGH (ref 4.6–6.5)

## 2022-09-13 LAB — LDL CHOLESTEROL, DIRECT: Direct LDL: 52 mg/dL

## 2022-09-13 LAB — TSH: TSH: 2.49 u[IU]/mL (ref 0.35–5.50)

## 2022-09-13 MED ORDER — VITAMIN D (ERGOCALCIFEROL) 1.25 MG (50000 UNIT) PO CAPS: 50000.0000 [IU] | ORAL_CAPSULE | ORAL | 0 refills | Status: DC

## 2022-09-13 NOTE — Therapy (Signed)
OUTPATIENT PHYSICAL THERAPY CERVICAL TREATMENT    Patient Name: Veronica Galvan MRN: [id number] DOB:1955/05/11, 67 y.o., female Today's Date: 09/13/2022  PT End of Session - 09/13/22 1454     Visit Number 74    Date for PT Re-Evaluation 09/15/22    PT Start Time 1445    PT Stop Time 1530    PT Time Calculation (min) 45 min    Activity Tolerance Patient tolerated treatment well    Behavior During Therapy WFL for tasks assessed/performed               Past Medical History:  Diagnosis Date   Allergy    Anxiety    Arthritis    hands   Cataract    Diabetes mellitus without complication (HCC)    type 2   Family history of adverse reaction to anesthesia    son has malignant hyperthermia, daughter does not daughter recently had c section without problems   GERD (gastroesophageal reflux disease)    Headache    sinus   Hyperlipidemia    Hypertension    PONV (postoperative nausea and vomiting)    nausea only   Ulcer, stomach peptic yrs ago   Past Surgical History:  Procedure Laterality Date   APPENDECTOMY     both hells bone spur repair     both heels with metal clips   both shoulder rotator cuff repair     CESAREAN SECTION     x 1   COLONOSCOPY WITH PROPOFOL N/A 09/07/2016   Procedure: COLONOSCOPY WITH PROPOFOL;  Surgeon: Charolett Bumpers, MD;  Location: [facility];  Service: Endoscopy;  Laterality: N/A;   colonscopy  06/2011   polyps   EYE SURGERY     FRACTURE SURGERY     TUBAL LIGATION     VESICO-VAGINAL FISTULA REPAIR     Patient Active Problem List   Diagnosis Date Noted   Gastroesophageal reflux disease 11/16/2021   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 08/18/2021   Dermatofibroma 08/18/2021   History of malignant neoplasm of skin 08/18/2021   Lentigo 08/18/2021   Melanocytic nevi of trunk 08/18/2021   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   Rosacea 08/18/2021   Actinic keratosis 08/18/2021   Sensorineural  hearing loss (SNHL) of left ear with unrestricted hearing of right ear 07/26/2021   Tinnitus of left ear 07/26/2021   Acute recurrent maxillary sinusitis 06/22/2021   Primary hypertension 01/12/2021   Hyperlipidemia associated with type 2 diabetes mellitus (HCC) 01/12/2021   Arthritis 01/12/2021   Type II diabetes mellitus (HCC) 11/25/2019   Ingrown toenail 09/05/2018   Neck pain 01/29/2018   Tick bite 10/24/2017    PCP: Ria Clock  REFERRING PROVIDER: Ocie Doyne  REFERRING DIAG: M54.2  THERAPY DIAG:  Cervicalgia  Chronic intractable headache, unspecified headache type  Muscle weakness (generalized)  Rationale for Evaluation and Treatment Rehabilitation  ONSET DATE: 11/08/21  SUBJECTIVE:  SUBJECTIVE STATEMENT: She is going to have a grandchild tomorrow, she did go to the MD, her A1C is down to 7.1, her cholesterol is within range, her BP was normal today, her and the MD are very pleased with the progress she has made PERTINENT HISTORY:  Arthritis, HTN, bilateral rotator cuff surgery 15-59yrs ago, DM  PAIN:  Are you having pain? Yes: NPRS scale: 6/10 Pain location: head and neck, shoulders  Pain description: tight  Aggravating factors: moving Relieving factors: massage, standing in the shower  WEIGHT BEARING RESTRICTIONS No  FALLS:  Has patient fallen in last 6 months? No  LIVING ENVIRONMENT: Lives with: lives alone Lives in: House/apartment Stairs: Yes: External: 3 steps; can reach both Has following equipment at home: None  OCCUPATION: reitred  PLOF: Independent  PATIENT GOALS to loosen it up, find out what is causing my headaches   OBJECTIVE:   DIAGNOSTIC FINDINGS:   FINDINGS: Brain: No acute infarct, mass effect or extra-axial collection. No acute  or chronic hemorrhage. Normal white matter signal, parenchymal volume and CSF spaces. The midline structures are normal.  Skull and upper cervical spine: Normal calvarium and skull base. Visualized upper cervical spine and soft tissues are normal.   Sinuses/Orbits:No paranasal sinus fluid levels or advanced mucosal thickening. No mastoid or middle ear effusion. Normal orbits.   IMPRESSION: Normal brain MRI.  PATIENT SURVEYS:  FOTO 53/100 ,  April 08, 2022 has been up to 22 and today was 71 but has been doing a lot and lifting and is back doing much more than she was.  January 4,2024 57/100 she does report tha tshe wnet backwards from her previous due to her being able to do so much more but is hurting more   COGNITION: Overall cognitive status: Within functional limits for tasks assessed  SENSATION: WFL  POSTURE: rounded shoulders, forward head, and increased thoracic kyphosis  PALPATION: TTP and sore along upper trap and cervical paraspinals, trigger points in R and L UT   CERVICAL ROM:   Active ROM A/PROM (deg) eval 12/30/21 01/05/22 9/28 10/31 04/05/22  Flexion 100% no pain WFL WFL WNL   WNL  Extension Mild limitations w/pain [facility]  WFL WNL   WNL  Right lateral flexion Mod limitation w/pain Mild limitations Mild limitations Severe limitation, leans with trunk  Decreased 75%  Decreased 50%  Left lateral flexion Mod limitation w/pain Mod limitations Mild limitations Severe limitation, leans with trunk  Decreased 75% Decreased  50%  Right rotation 75% w/pain Mild limitation w/pain WFL pain at end range  Mild limitation  Decreased 25%  Decreased 25%  Left rotation 75% w/pain  Mild limitation w/pain  WFL pain at end range  Mild limitation  Decreased 25% Decreased  25%   (Blank rows = not tested)  UPPER EXTREMITY ROM: WFL, limited in IR and ER due to prior RC surgery years ago   UPPER EXTREMITY MMT:  MMT Right eval Left eval Right 01/05/22 Left 01/05/22 R 9/28 L 9/28  Shoulder  flexion 4+ 4+ 5 5 5 5   Shoulder extension        Shoulder abduction 4+ very painful 4+ 4+ with pain 5 5 5   Shoulder adduction        Shoulder extension        Shoulder internal rotation 4+ 4+ 5 5 5 5   Shoulder external rotation     5 5  Middle trapezius        Lower trapezius  Elbow flexion 5 5      Elbow extension 5 5      Wrist flexion        Wrist extension        Wrist ulnar deviation        Wrist radial deviation        Wrist pronation        Wrist supination        Grip strength         (Blank rows = not tested) 05/05/22 LE MMT  right 4+/5, left 4/5  SLR 40 degrees with pain in the legs and the back  Cannot cross her legs due to tight piriformis 25 degrees hip ER  Abdominal strength 2/5 unable to sit up or hold at 40 degrees  5x STS 21 seconds 06/14/22 TUG 10 seconds  5XSTS 16 seconds  Right 4+/5, left 4+/5  Manually I can cross her legs fully, she reports able to get   Socks on now, she can on her own without hands get her  Heels to mid patella  SLR to 55 degrees  Can hold plank 10 seconds elbows and toes  Had her A1C go down from 8.2 to 7.8   07/14/22  can hold plank 20 seconds x4  TUG 7 seconds  08/16/22  still very difficult to put socks on, is getting closer to being able to cross leg over.  Heels to superior patella. Weight today was 202#.  5XSTS 11 seconds.  Planks 30 seconds x3 and 20s x 1  TODAY'S TREATMENT:  09/13/22 UBE level 4.5 x 6 minutes HS curls 35# 2x10 Leg extension10# 3x10 20# farmer carry 1 lap each hand 5# overhead carry both arms together On bosu 3# biceps, alternating press and lateral raise Seated row 25# 2x15 25# lats 2x15 5# hip abduction, extension and flexion volleyball  09/08/22 UBE level 4 x 6 minutes 2 laps brisk walk Pushing tmill 3x30seconds 25# rows 25# lats 10# trunk rotation from low to high 20# trunk chops PAssive LE stretches  09/06/22 UBE level 4 x 6 minutes Gait 2 laps brisk pace no rest 60# leg press 3x10 35#  leg curls 2x10 10# leg extension 2x10 50# resisted gait all directions Bike level 4 x 6 minutes with 3 power bursts 500watts, 400 watts, 455 watts On bosu balance and reaching and then squats 20# trunk chops 2x10  09/01/22 Bike level 4 with 3 power bursts x 6 minutes up to 350 watts Leg press 60#  On airex 15# straight arm pulls 20# AR press 60# leg press 3 x10 10# hip extension and abduction and flexion On bosu 3# biceps and overhead press Wobble board balance Planks 30s x 2  08/29/22 2 laps brisk pace Leg extension 15# 2x15 LEg curls 35# 3x10 15# straight arm pulls 20# AR press 10# hip extension and hip abduction 10# and 20# sit to stand 25# rows 25# lats Tmill pushes 3x30s Passive HS and piriformis stretch  08/25/22 Gait 2 laps with 3# biceps and punches Seated row 25# Lats 25# 60# leg press 2x10 Worked on getting up from the floor able to do pretty well Planks 2x 30s Elliptical 4 minutes 10# hip extension, abduction and flexion WEight 200.5#  08/23/22 Gait 2 laps around the back building 15# straight arm pulls 25# lats and Rows Tmill push two ways Nustep level 6 x 5 minutes shooting for 350 steps On bosu 3# biceps, punches and abduction 10# hip extension and abduction  08/18/22 UBE level 4 x 6 minutes Rows 25# Lats 25# Black tband extension HS curls 25# 2x15 Leg extension 10# 2x15 On bosu 4# biceps, punches, ball toss and weighted ball  STM to the right upper trap and rhomboid  08/16/22 Walking 2 laps brisk pace Balance on bosu 2 ways, balance, head turns and reaching  Airex plank side stepping and tandem gait LEg press 40# 2x15 PROM of the LE's Assessment as noted above  08/11/22 Weight 201# Elliptical level 4 x 3 minutes Leg curls 35# 2x15 Leg extension 15# 2x15 On airex 15# 2x12 Planks 4 x 20s cues to breath Feet on ball bridges 2x10 HS and piriformis stretch Partial eccentric sit ups 20# trunk axe chop Upper trap and levator  stretches   08/09/22 Weight 202# 2 laps outside good pace one rest 15# straight arm pulls 20# AR press 10# hip extension abd, flexion 2x15 25# seated rows 25# lats 10# chest press 40# leg press Passive stretch to the LE's  07/21/22 Fast walk around the building no rest 10# hip extension, abduction and flexion Sit to stands 10# overhead press on sit fit  10# straight arm pulls 15# AR press 25# rows Black tband extension and abs HS, calf and piriformis stretch  07/19/22 Weight 203# Gait around the back building brisk pace, one short rest 60# leg press 2x15 35# straight arm pulls  35# triceps 5# hips 3 ways Tmill push and pulls 20s x 3 each 3# overhead stick carry Passive HS and piriformis stretch  07/14/22 Weight 204# Fast walk around the back building 10# Hip extension and abduction 15# AR press 15# straight arm pulls Leg press 60# 2x15 Planks 4x20 seconds On bosu balance 3# alternating work  07/12/22 Weight 207# Brisk walk around the back building without rest and able to carry on a conversation 35# leg curls 10# leg extension Passive stretch LE's Education in calories in and out, carbs, protein and fiber 25# rows 2x15 Lats 25# 2x15 Elliptical 3 minutes level 4   PATIENT EDUCATION:  Education details: POC Person educated: Patient Education method: Explanation Education comprehension: verbalized understanding   HOME EXERCISE PROGRAM: Access Code: [email or web address] URL: [email or web address] Date: 12/01/2021 Prepared by: Cassie Freer   ASSESSMENT:  CLINICAL IMPRESSION: Patient with great test results, doing better with A1C, glucose, BP and weight compared to the last MD check.  MD feels that with the combination of the medications and our program that this is doing very well for her and would like [place] to continue  REHAB POTENTIAL: Good  CLINICAL DECISION MAKING: Stable/uncomplicated  EVALUATION COMPLEXITY: Low   GOALS: Goals reviewed with  patient? Yes  SHORT TERM GOALS: Target date: 12/29/21  Patient will be independent with initial HEP.  Goal status: MET  LONG TERM GOALS: Target date: 02/26/22  Patient will be independent with advanced/ongoing HEP to improve outcomes and carryover.  Goal status: continuing 08/16/22  2.  Patient will report 75% improvement in neck pain to improve QOL.   Baseline: 9/28- not even 50% improvement maybe 40% Goal status: met  3.  Patient will demonstrate full pain free cervical ROM to complete ADLs and play with grandchildren with more ease. Goal status: met 4.  Patient will demonstrate improved posture to decrease muscle imbalance and tightness.  Baseline: increased tightness along cervical paraspinals Goal status: progressing still tight piriformis 08/16/22  5.  Patient will report 57 on FOTO to demonstrate improved functional ability.  Baseline:  9/28- 64  Goal status: MET but has  regressed as she is doing more and more  6.  Will demonstrate improved core strength as evidenced by passing double leg lower test  Baseline:  Goal status: progressing 08/16/22 7.  Average 11,000 steps a day on her fit bit over a month period.  Met 08/25/22   PLAN: PT FREQUENCY: 2x/week  PT DURATION: 4 weeks  PLANNED INTERVENTIONS: Therapeutic exercises, Therapeutic activity, Neuromuscular re-education, Balance training, Gait training, Patient/Family education, Self Care, Joint mobilization, Dry Needling, Spinal manipulation, Spinal mobilization, Cryotherapy, Moist heat, Taping, Vasopneumatic device, Traction, Ionotophoresis 4mg /ml Dexamethasone, and Manual therapy  PLAN FOR NEXT SESSION:  continue to progress, assess Stacie Glaze, PT 09/13/2022, 2:54 PM   [facility] [address] [facility]. [place], [place], [address] Phone: [phone]   Fax:  [phone]

## 2022-09-13 NOTE — Progress Notes (Signed)
Veronica Galvan is a 67 y.o. 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   Rosacea 08/18/2021   Actinic keratosis 08/18/2021   Sensorineural hearing loss (SNHL) of left ear with unrestricted hearing of right ear 07/26/2021   Tinnitus of left ear 07/26/2021   Acute recurrent maxillary sinusitis 06/22/2021   Primary hypertension 01/12/2021   Hyperlipidemia associated with type 2 diabetes mellitus (HCC) 01/12/2021   Arthritis 01/12/2021   Type II diabetes mellitus (HCC) 11/25/2019   Ingrown toenail 09/05/2018   Neck pain 01/29/2018   Tick bite 10/24/2017    Current Outpatient Medications  Medication Sig Dispense Refill   ALPRAZolam (XANAX) 0.5 MG tablet Take 1 tablet (0.5 mg total) by mouth at bedtime as needed for anxiety. 30 tablet 0   Ascorbic Acid (VITAMIN C) 1000 MG tablet Take 1,000 mg by mouth every morning.     aspirin EC 81 MG tablet Take 81 mg by mouth every morning.     atorvastatin (LIPITOR) 40 MG tablet Take 1 tablet (40 mg total) by mouth every evening. 90 tablet 3   Calcium Carb-Cholecalciferol (CALCIUM 600+D3 PO) Take 1 tablet by mouth every morning.     celecoxib (CELEBREX) 200 MG capsule Take 1 capsule (200 mg total) by mouth daily. 90 capsule 3   Cholecalciferol (VITAMIN D) 2000 units tablet Take 2,000 Units by mouth daily.     Coenzyme Q10 300 MG CAPS Take 1 capsule by mouth every morning.     glucose blood test strip Use as instructed 100 each 12   levocetirizine (XYZAL) 5 MG tablet Take 1 tablet (5 mg total) by mouth every evening. 90 tablet 3   lisinopril (ZESTRIL) 5 MG tablet Take 1  tablet (5 mg total) by mouth 2 (two) times daily. 180 tablet 3   LUTEIN-ZEAXANTHIN PO Take 1 tablet by mouth every morning.     Multiple Vitamins-Minerals (MULTIVITAMIN WITH MINERALS) tablet Take 1 tablet by mouth every morning.     nystatin-triamcinolone ointment (MYCOLOG) Apply 1 Application topically 2 (two) times daily. 60 g 0   Omega-3 Fatty Acids (EQL OMEGA 3 FISH OIL) 1400 MG CAPS Take 2 capsules by mouth daily.     omeprazole (PRILOSEC) 20 MG capsule Take 1 capsule (20 mg total) by mouth every morning. 90 capsule 3   Semaglutide, 2 MG/DOSE, 8 MG/3ML SOPN Inject 2 mg as directed once a week. 3 mL 0   SYNJARDY XR 12.08-998 MG TB24 Take 1 tablet by mouth 2 (two) times daily. 180 tablet 3   UNABLE TO FIND Med Name: Cinnamon/Cinsilin     VITAMIN E PO Take by mouth.     diclofenac Sodium (VOLTAREN) 1 % GEL Apply topically 4 (four) times daily. (Patient not taking: Reported on 08/05/2022)     fluconazole (DIFLUCAN) 100 MG tablet Take 1 tablet (100 mg total) by mouth daily. (Patient not taking: Reported on 09/13/2022) 5 tablet 0   No current facility-administered medications for this visit.    Allergies: Codeine, Benzonatate, and Epinephrine  Past Medical History:  Diagnosis Date   Allergy    Anxiety    Arthritis    hands   Cataract  Diabetes mellitus without complication (HCC)    type 2   Family history of adverse reaction to anesthesia    son has malignant hyperthermia, daughter does not daughter recently had c section without problems   GERD (gastroesophageal reflux disease)    Headache    sinus   Hyperlipidemia    Hypertension    PONV (postoperative nausea and vomiting)    nausea only   Ulcer, stomach peptic yrs ago    Past Surgical History:  Procedure Laterality Date   APPENDECTOMY     both hells bone spur repair     both heels with metal clips   both shoulder rotator cuff repair     CESAREAN SECTION     x 1   COLONOSCOPY WITH PROPOFOL N/A 09/07/2016   Procedure:  COLONOSCOPY WITH PROPOFOL;  Surgeon: Charolett Bumpers, MD;  Location: [facility];  Service: Endoscopy;  Laterality: N/A;   colonscopy  06/2011   polyps   EYE SURGERY     FRACTURE SURGERY     TUBAL LIGATION     VESICO-VAGINAL FISTULA REPAIR      Family History  Problem Relation Age of Onset   Hypertension Mother    COPD Mother    Cancer Mother    Arthritis Mother    Hypertension Father    Hyperlipidemia Father    Diabetes Father    Cancer Father    Alcohol abuse Father    Diabetes Brother    Alcohol abuse Brother    Hypertension Brother    Arthritis Maternal Grandmother    Birth defects Maternal Grandmother    Diabetes Paternal Grandmother    Heart disease Paternal Grandfather    Diabetes Paternal Aunt    Diabetes Paternal Aunt    Obesity Son     Social History   Tobacco Use   Smoking status: Former    Packs/day: 1.00    Years: 14.00    Additional pack years: 0.00    Total pack years: 14.00    Types: Cigarettes   Smokeless tobacco: Never   Tobacco comments:    quit 31 yrs ago  Substance Use Topics   Alcohol use: Yes    Comment: rare    Subjective:   Presents for yearly CPE; up to date on preventive healthcare needs; continuing to work with PT for management of chronic neck pain/ headaches;    Objective:  Vitals:   09/13/22 0901  BP: 134/78  Pulse: 88  SpO2: 98%  Weight: 200 lb 6.4 oz (90.9 kg)  Height: 5\' 1"  (1.549 m)    General: Well developed, well nourished, in no acute distress  Skin : Warm and dry.  Head: Normocephalic and atraumatic  Eyes: Sclera and conjunctiva clear; pupils round and reactive to light; extraocular movements intact  Ears: External normal; canals clear; tympanic membranes normal  Oropharynx: Pink, supple. No suspicious lesions  Neck: Supple without thyromegaly, adenopathy  Lungs: Respirations unlabored; clear to auscultation bilaterally without wheeze, rales, rhonchi  CVS exam: normal rate and regular rhythm.  Abdomen: Soft;  nontender; nondistended; normoactive bowel sounds; no masses or hepatosplenomegaly  Musculoskeletal: No deformities; no active joint inflammation  Extremities: No edema, cyanosis, clubbing  Vessels: Symmetric bilaterally  Neurologic: Alert and oriented; speech intact; face symmetrical; moves all extremities well; CNII-XII intact without focal deficit   Assessment:  1. PE (physical exam), annual   2. Osteoporosis screening   3. Ovarian failure   4. Lipid screening   5. Type 2 diabetes  mellitus without complication, without long-term current use of insulin (HCC)   6. Vitamin D deficiency   7. Long term (current) use of oral hypoglycemic drugs     Plan:  Age appropriate preventive healthcare needs addressed; encouraged regular eye doctor and dental exams; encouraged regular exercise; will update labs and refills as needed today; follow-up to be determined; She will call her podiatrist for yearly follow up as well; DEXA ordered;    No follow-ups on file.  Orders Placed This Encounter  Procedures   DG Bone Density    Standing Status:   Future    Standing Expiration Date:   09/13/2023    Order Specific Question:   Reason for Exam (SYMPTOM  OR DIAGNOSIS REQUIRED)    Answer:   ovarian failure/ osteoporosis screening    Order Specific Question:   Preferred imaging location?    Answer:   [facility]   CBC with Differential/Platelet   Comp Met (CMET)   Lipid panel   Hemoglobin A1c   TSH   Vitamin D (25 hydroxy)    Requested Prescriptions    No prescriptions requested or ordered in this encounter

## 2022-09-14 ENCOUNTER — Encounter: Payer: Self-pay | Admitting: Physical Therapy

## 2022-09-14 ENCOUNTER — Ambulatory Visit: Payer: Medicare Other | Admitting: Physical Therapy

## 2022-09-14 DIAGNOSIS — R519 Headache, unspecified: Secondary | ICD-10-CM | POA: Diagnosis not present

## 2022-09-14 DIAGNOSIS — M6281 Muscle weakness (generalized): Secondary | ICD-10-CM

## 2022-09-14 DIAGNOSIS — G8929 Other chronic pain: Secondary | ICD-10-CM

## 2022-09-14 DIAGNOSIS — M542 Cervicalgia: Secondary | ICD-10-CM

## 2022-09-14 NOTE — Therapy (Signed)
OUTPATIENT PHYSICAL THERAPY CERVICAL TREATMENT    Patient Name: Veronica Galvan MRN: [id number] DOB:Jan 25, 1956, 67 y.o., female Today's Date: 09/14/2022  PT End of Session - 09/14/22 0845     Visit Number 75    Date for PT Re-Evaluation 09/15/22    PT Start Time 0844    PT Stop Time 0930    PT Time Calculation (min) 46 min    Activity Tolerance Patient tolerated treatment well    Behavior During Therapy [facility] for tasks assessed/performed               Past Medical History:  Diagnosis Date   Allergy    Anxiety    Arthritis    hands   Cataract    Diabetes mellitus without complication (HCC)    type 2   Family history of adverse reaction to anesthesia    son has malignant hyperthermia, daughter does not daughter recently had c section without problems   GERD (gastroesophageal reflux disease)    Headache    sinus   Hyperlipidemia    Hypertension    PONV (postoperative nausea and vomiting)    nausea only   Ulcer, stomach peptic yrs ago   Past Surgical History:  Procedure Laterality Date   APPENDECTOMY     both hells bone spur repair     both heels with metal clips   both shoulder rotator cuff repair     CESAREAN SECTION     x 1   COLONOSCOPY WITH PROPOFOL N/A 09/07/2016   Procedure: COLONOSCOPY WITH PROPOFOL;  Surgeon: Charolett Bumpers, MD;  Location: [facility];  Service: Endoscopy;  Laterality: N/A;   colonscopy  06/2011   polyps   EYE SURGERY     FRACTURE SURGERY     TUBAL LIGATION     VESICO-VAGINAL FISTULA REPAIR     Patient Active Problem List   Diagnosis Date Noted   Gastroesophageal reflux disease 11/16/2021   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 08/18/2021   Dermatofibroma 08/18/2021   History of malignant neoplasm of skin 08/18/2021   Lentigo 08/18/2021   Melanocytic nevi of trunk 08/18/2021   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   Rosacea 08/18/2021   Actinic keratosis 08/18/2021   Sensorineural  hearing loss (SNHL) of left ear with unrestricted hearing of right ear 07/26/2021   Tinnitus of left ear 07/26/2021   Acute recurrent maxillary sinusitis 06/22/2021   Primary hypertension 01/12/2021   Hyperlipidemia associated with type 2 diabetes mellitus (HCC) 01/12/2021   Arthritis 01/12/2021   Type II diabetes mellitus (HCC) 11/25/2019   Ingrown toenail 09/05/2018   Neck pain 01/29/2018   Tick bite 10/24/2017    PCP: Ria Clock  REFERRING PROVIDER: Ocie Doyne  REFERRING DIAG: M54.2  THERAPY DIAG:  Cervicalgia  Chronic intractable headache, unspecified headache type  Muscle weakness (generalized)  Rationale for Evaluation and Treatment Rehabilitation  ONSET DATE: 11/08/21  SUBJECTIVE:  SUBJECTIVE STATEMENT: Patient doing well, going to hospital after this to have a grand baby  PERTINENT HISTORY:  Arthritis, HTN, bilateral rotator cuff surgery 15-70yrs ago, DM  PAIN:  Are you having pain? Yes: NPRS scale: 6/10 Pain location: head and neck, shoulders  Pain description: tight  Aggravating factors: moving Relieving factors: massage, standing in the shower  WEIGHT BEARING RESTRICTIONS No  FALLS:  Has patient fallen in last 6 months? No  LIVING ENVIRONMENT: Lives with: lives alone Lives in: House/apartment Stairs: Yes: External: 3 steps; can reach both Has following equipment at home: None  OCCUPATION: reitred  PLOF: Independent  PATIENT GOALS to loosen it up, find out what is causing my headaches   OBJECTIVE:   DIAGNOSTIC FINDINGS:   FINDINGS: Brain: No acute infarct, mass effect or extra-axial collection. No acute or chronic hemorrhage. Normal white matter signal, parenchymal volume and CSF spaces. The midline structures are normal.  Skull and upper  cervical spine: Normal calvarium and skull base. Visualized upper cervical spine and soft tissues are normal.   Sinuses/Orbits:No paranasal sinus fluid levels or advanced mucosal thickening. No mastoid or middle ear effusion. Normal orbits.   IMPRESSION: Normal brain MRI.  PATIENT SURVEYS:  FOTO 53/100 ,  April 08, 2022 has been up to 92 and today was 68 but has been doing a lot and lifting and is back doing much more than she was.  January 4,2024 57/100 she does report tha tshe wnet backwards from her previous due to her being able to do so much more but is hurting more   COGNITION: Overall cognitive status: Within functional limits for tasks assessed  SENSATION: WFL  POSTURE: rounded shoulders, forward head, and increased thoracic kyphosis  PALPATION: TTP and sore along upper trap and cervical paraspinals, trigger points in R and L UT   CERVICAL ROM:   Active ROM A/PROM (deg) eval 12/30/21 01/05/22 9/28 10/31 04/05/22  Flexion 100% no pain WFL WFL WNL   WNL  Extension Mild limitations w/pain [facility]  WFL WNL   WNL  Right lateral flexion Mod limitation w/pain Mild limitations Mild limitations Severe limitation, leans with trunk  Decreased 75%  Decreased 50%  Left lateral flexion Mod limitation w/pain Mod limitations Mild limitations Severe limitation, leans with trunk  Decreased 75% Decreased  50%  Right rotation 75% w/pain Mild limitation w/pain WFL pain at end range  Mild limitation  Decreased 25%  Decreased 25%  Left rotation 75% w/pain  Mild limitation w/pain  WFL pain at end range  Mild limitation  Decreased 25% Decreased  25%   (Blank rows = not tested)  UPPER EXTREMITY ROM: WFL, limited in IR and ER due to prior RC surgery years ago   UPPER EXTREMITY MMT:  MMT Right eval Left eval Right 01/05/22 Left 01/05/22 R 9/28 L 9/28  Shoulder flexion 4+ 4+ 5 5 5 5   Shoulder extension        Shoulder abduction 4+ very painful 4+ 4+ with pain 5 5 5   Shoulder adduction         Shoulder extension        Shoulder internal rotation 4+ 4+ 5 5 5 5   Shoulder external rotation     5 5  Middle trapezius        Lower trapezius        Elbow flexion 5 5      Elbow extension 5 5      Wrist flexion  Wrist extension        Wrist ulnar deviation        Wrist radial deviation        Wrist pronation        Wrist supination        Grip strength         (Blank rows = not tested) 05/05/22 LE MMT  right 4+/5, left 4/5  SLR 40 degrees with pain in the legs and the back  Cannot cross her legs due to tight piriformis 25 degrees hip ER  Abdominal strength 2/5 unable to sit up or hold at 40 degrees  5x STS 21 seconds 06/14/22 TUG 10 seconds  5XSTS 16 seconds  Right 4+/5, left 4+/5  Manually I can cross her legs fully, she reports able to get   Socks on now, she can on her own without hands get her  Heels to mid patella  SLR to 55 degrees  Can hold plank 10 seconds elbows and toes  Had her A1C go down from 8.2 to 7.8   07/14/22  can hold plank 20 seconds x4  TUG 7 seconds  08/16/22  still very difficult to put socks on, is getting closer to being able to cross leg over.  Heels to superior patella. Weight today was 202#.  5XSTS 11 seconds.  Planks 30 seconds x3 and 20s x 1  TODAY'S TREATMENT:  09/14/22 Weight 199# Elliptical level 4 x 4 minutes 20# sit to stand from various heights lowest 15" Leg press 30# 2x10 single legs, 70# both legs On bosu ball balance and squats 2 ways Volleyball Planks 30 sec x 2 Passive stretch HS, piriformis and quads 20# axe chops  09/13/22 UBE level 4.5 x 6 minutes HS curls 35# 2x10 Leg extension10# 3x10 20# farmer carry 1 lap each hand 5# overhead carry both arms together On bosu 3# biceps, alternating press and lateral raise Seated row 25# 2x15 25# lats 2x15 5# hip abduction, extension and flexion volleyball  09/08/22 UBE level 4 x 6 minutes 2 laps brisk walk Pushing tmill 3x30seconds 25# rows 25# lats 10# trunk rotation  from low to high 20# trunk chops PAssive LE stretches  09/06/22 UBE level 4 x 6 minutes Gait 2 laps brisk pace no rest 60# leg press 3x10 35# leg curls 2x10 10# leg extension 2x10 50# resisted gait all directions Bike level 4 x 6 minutes with 3 power bursts 500watts, 400 watts, 455 watts On bosu balance and reaching and then squats 20# trunk chops 2x10  09/01/22 Bike level 4 with 3 power bursts x 6 minutes up to 350 watts Leg press 60#  On airex 15# straight arm pulls 20# AR press 60# leg press 3 x10 10# hip extension and abduction and flexion On bosu 3# biceps and overhead press Wobble board balance Planks 30s x 2  08/29/22 2 laps brisk pace Leg extension 15# 2x15 LEg curls 35# 3x10 15# straight arm pulls 20# AR press 10# hip extension and hip abduction 10# and 20# sit to stand 25# rows 25# lats Tmill pushes 3x30s Passive HS and piriformis stretch  08/25/22 Gait 2 laps with 3# biceps and punches Seated row 25# Lats 25# 60# leg press 2x10 Worked on getting up from the floor able to do pretty well Planks 2x 30s Elliptical 4 minutes 10# hip extension, abduction and flexion WEight 200.5#  08/23/22 Gait 2 laps around the back building 15# straight arm pulls 25# lats and Rows Tmill push two  ways Nustep level 6 x 5 minutes shooting for 350 steps On bosu 3# biceps, punches and abduction 10# hip extension and abduction  08/18/22 UBE level 4 x 6 minutes Rows 25# Lats 25# Black tband extension HS curls 25# 2x15 Leg extension 10# 2x15 On bosu 4# biceps, punches, ball toss and weighted ball  STM to the right upper trap and rhomboid  08/16/22 Walking 2 laps brisk pace Balance on bosu 2 ways, balance, head turns and reaching  Airex plank side stepping and tandem gait LEg press 40# 2x15 PROM of the LE's Assessment as noted above  08/11/22 Weight 201# Elliptical level 4 x 3 minutes Leg curls 35# 2x15 Leg extension 15# 2x15 On airex 15# 2x12 Planks 4 x 20s cues  to breath Feet on ball bridges 2x10 HS and piriformis stretch Partial eccentric sit ups 20# trunk axe chop Upper trap and levator stretches   08/09/22 Weight 202# 2 laps outside good pace one rest 15# straight arm pulls 20# AR press 10# hip extension abd, flexion 2x15 25# seated rows 25# lats 10# chest press 40# leg press Passive stretch to the LE's  07/21/22 Fast walk around the building no rest 10# hip extension, abduction and flexion Sit to stands 10# overhead press on sit fit  10# straight arm pulls 15# AR press 25# rows Black tband extension and abs HS, calf and piriformis stretch  07/19/22 Weight 203# Gait around the back building brisk pace, one short rest 60# leg press 2x15 35# straight arm pulls  35# triceps 5# hips 3 ways Tmill push and pulls 20s x 3 each 3# overhead stick carry Passive HS and piriformis stretch  07/14/22 Weight 204# Fast walk around the back building 10# Hip extension and abduction 15# AR press 15# straight arm pulls Leg press 60# 2x15 Planks 4x20 seconds On bosu balance 3# alternating work  07/12/22 Weight 207# Brisk walk around the back building without rest and able to carry on a conversation 35# leg curls 10# leg extension Passive stretch LE's Education in calories in and out, carbs, protein and fiber 25# rows 2x15 Lats 25# 2x15 Elliptical 3 minutes level 4   PATIENT EDUCATION:  Education details: POC Person educated: Patient Education method: Explanation Education comprehension: verbalized understanding   HOME EXERCISE PROGRAM: Access Code: [email or web address] URL: [email or web address] Date: 12/01/2021 Prepared by: Cassie Freer   ASSESSMENT:  CLINICAL IMPRESSION: Patient with great test results, doing better with A1C, glucose, BP and weight compared to the last MD check.  She is now below 200# for the first time in over a year.  We continue to push her abilities to help her overall health and  wellness  REHAB POTENTIAL: Good  CLINICAL DECISION MAKING: Stable/uncomplicated  EVALUATION COMPLEXITY: Low   GOALS: Goals reviewed with patient? Yes  SHORT TERM GOALS: Target date: 12/29/21  Patient will be independent with initial HEP.  Goal status: MET  LONG TERM GOALS: Target date: 02/26/22  Patient will be independent with advanced/ongoing HEP to improve outcomes and carryover.  Goal status: continuing 08/16/22  2.  Patient will report 75% improvement in neck pain to improve QOL.   Baseline: 9/28- not even 50% improvement maybe 40% Goal status: met  3.  Patient will demonstrate full pain free cervical ROM to complete ADLs and play with grandchildren with more ease. Goal status: met 4.  Patient will demonstrate improved posture to decrease muscle imbalance and tightness.  Baseline: increased tightness along cervical paraspinals Goal status: progressing still tight piriformis 08/16/22  5.  Patient will report 53 on FOTO to demonstrate improved functional ability.  Baseline:  9/28- 64  Goal status: MET but has regressed as she is doing more and more  6.  Will demonstrate improved core strength as evidenced by passing double leg lower test  Baseline:  Goal status: progressing 08/16/22 7.  Average 11,000 steps a day on her fit bit over a month period.  Met 08/25/22   PLAN: PT FREQUENCY: 2x/week  PT DURATION: 4 weeks  PLANNED INTERVENTIONS: Therapeutic exercises, Therapeutic activity, Neuromuscular re-education, Balance training, Gait training, Patient/Family education, Self Care, Joint mobilization, Dry Needling, Spinal manipulation, Spinal mobilization, Cryotherapy, Moist heat, Taping, Vasopneumatic device, Traction, Ionotophoresis 4mg /ml Dexamethasone, and Manual therapy  PLAN FOR NEXT SESSION:  assess and possible renewal Stacie Glaze, PT 09/14/2022, 8:46 AM   [facility] [address] [facility]. [place],  [place], [address] Phone: [phone]   Fax:  [phone]

## 2022-09-16 ENCOUNTER — Encounter: Payer: Self-pay | Admitting: Family

## 2022-09-19 ENCOUNTER — Other Ambulatory Visit: Payer: Self-pay | Admitting: Family

## 2022-09-19 MED ORDER — VITAMIN D (ERGOCALCIFEROL) 1.25 MG (50000 UNIT) PO CAPS: 50000.0000 [IU] | ORAL_CAPSULE | ORAL | 0 refills | Status: AC

## 2022-09-20 ENCOUNTER — Ambulatory Visit: Payer: Medicare Other | Admitting: Physical Therapy

## 2022-09-20 ENCOUNTER — Ambulatory Visit (HOSPITAL_BASED_OUTPATIENT_CLINIC_OR_DEPARTMENT_OTHER)
Admission: RE | Admit: 2022-09-20 | Discharge: 2022-09-20 | Disposition: A | Discharge status: Home / Self Care | Payer: Medicare Other | Source: Ambulatory Visit | Attending: Family | Admitting: Family

## 2022-09-20 ENCOUNTER — Encounter: Payer: Self-pay | Admitting: Physical Therapy

## 2022-09-20 DIAGNOSIS — M6281 Muscle weakness (generalized): Secondary | ICD-10-CM | POA: Diagnosis not present

## 2022-09-20 DIAGNOSIS — G8929 Other chronic pain: Secondary | ICD-10-CM | POA: Diagnosis not present

## 2022-09-20 DIAGNOSIS — Z13820 Encounter for screening for osteoporosis: Secondary | ICD-10-CM | POA: Insufficient documentation

## 2022-09-20 DIAGNOSIS — Z780 Asymptomatic menopausal state: Secondary | ICD-10-CM | POA: Diagnosis not present

## 2022-09-20 DIAGNOSIS — M542 Cervicalgia: Secondary | ICD-10-CM | POA: Diagnosis not present

## 2022-09-20 DIAGNOSIS — R519 Headache, unspecified: Secondary | ICD-10-CM | POA: Diagnosis not present

## 2022-09-20 DIAGNOSIS — E2839 Other primary ovarian failure: Secondary | ICD-10-CM | POA: Diagnosis not present

## 2022-09-20 NOTE — Therapy (Signed)
OUTPATIENT PHYSICAL THERAPY CERVICAL TREATMENT    Patient Name: Veronica Galvan MRN: [id number] DOB:07/09/1955, 67 y.o., female Today's Date: 09/20/2022  PT End of Session - 09/20/22 0840     Visit Number 76    Date for PT Re-Evaluation 10/21/22    PT Start Time 0840    PT Stop Time 0930    PT Time Calculation (min) 50 min    Activity Tolerance Patient tolerated treatment well    Behavior During Therapy [facility] for tasks assessed/performed               Past Medical History:  Diagnosis Date   Allergy    Anxiety    Arthritis    hands   Cataract    Diabetes mellitus without complication (HCC)    type 2   Family history of adverse reaction to anesthesia    son has malignant hyperthermia, daughter does not daughter recently had c section without problems   GERD (gastroesophageal reflux disease)    Headache    sinus   Hyperlipidemia    Hypertension    PONV (postoperative nausea and vomiting)    nausea only   Ulcer, stomach peptic yrs ago   Past Surgical History:  Procedure Laterality Date   APPENDECTOMY     both hells bone spur repair     both heels with metal clips   both shoulder rotator cuff repair     CESAREAN SECTION     x 1   COLONOSCOPY WITH PROPOFOL N/A 09/07/2016   Procedure: COLONOSCOPY WITH PROPOFOL;  Surgeon: Charolett Bumpers, MD;  Location: [facility];  Service: Endoscopy;  Laterality: N/A;   colonscopy  06/2011   polyps   EYE SURGERY     FRACTURE SURGERY     TUBAL LIGATION     VESICO-VAGINAL FISTULA REPAIR     Patient Active Problem List   Diagnosis Date Noted   Gastroesophageal reflux disease 11/16/2021   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 08/18/2021   Dermatofibroma 08/18/2021   History of malignant neoplasm of skin 08/18/2021   Lentigo 08/18/2021   Melanocytic nevi of trunk 08/18/2021   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   Rosacea 08/18/2021   Actinic keratosis 08/18/2021   Sensorineural  hearing loss (SNHL) of left ear with unrestricted hearing of right ear 07/26/2021   Tinnitus of left ear 07/26/2021   Acute recurrent maxillary sinusitis 06/22/2021   Primary hypertension 01/12/2021   Hyperlipidemia associated with type 2 diabetes mellitus (HCC) 01/12/2021   Arthritis 01/12/2021   Type II diabetes mellitus (HCC) 11/25/2019   Ingrown toenail 09/05/2018   Neck pain 01/29/2018   Tick bite 10/24/2017    PCP: Ria Clock  REFERRING PROVIDER: Ocie Doyne  REFERRING DIAG: M54.2  THERAPY DIAG:  Cervicalgia  Chronic intractable headache, unspecified headache type  Muscle weakness (generalized)  Rationale for Evaluation and Treatment Rehabilitation  ONSET DATE: 11/08/21  SUBJECTIVE:  SUBJECTIVE STATEMENT: OMG, my legs were so sore after the last visit, I recovered but I really was blown away at how sore I was.  PERTINENT HISTORY:  Arthritis, HTN, bilateral rotator cuff surgery 15-64yrs ago, DM  PAIN:  Are you having pain? Yes: NPRS scale: 6/10 Pain location: head and neck, shoulders  Pain description: tight  Aggravating factors: moving Relieving factors: massage, standing in the shower  WEIGHT BEARING RESTRICTIONS No  FALLS:  Has patient fallen in last 6 months? No  LIVING ENVIRONMENT: Lives with: lives alone Lives in: House/apartment Stairs: Yes: External: 3 steps; can reach both Has following equipment at home: None  OCCUPATION: reitred  PLOF: Independent  PATIENT GOALS to loosen it up, find out what is causing my headaches   OBJECTIVE:   DIAGNOSTIC FINDINGS:   FINDINGS: Brain: No acute infarct, mass effect or extra-axial collection. No acute or chronic hemorrhage. Normal white matter signal, parenchymal volume and CSF spaces. The midline  structures are normal.  Skull and upper cervical spine: Normal calvarium and skull base. Visualized upper cervical spine and soft tissues are normal.   Sinuses/Orbits:No paranasal sinus fluid levels or advanced mucosal thickening. No mastoid or middle ear effusion. Normal orbits.   IMPRESSION: Normal brain MRI.  PATIENT SURVEYS:  FOTO 53/100 ,  April 08, 2022 has been up to 89 and today was 1 but has been doing a lot and lifting and is back doing much more than she was.  January 4,2024 57/100 she does report tha tshe wnet backwards from her previous due to her being able to do so much more but is hurting more   COGNITION: Overall cognitive status: Within functional limits for tasks assessed  SENSATION: WFL  POSTURE: rounded shoulders, forward head, and increased thoracic kyphosis  PALPATION: TTP and sore along upper trap and cervical paraspinals, trigger points in R and L UT   CERVICAL ROM:   Active ROM A/PROM (deg) eval 12/30/21 01/05/22 9/28 10/31 04/05/22  Flexion 100% no pain WFL WFL WNL   WNL  Extension Mild limitations w/pain [facility]  WFL WNL   WNL  Right lateral flexion Mod limitation w/pain Mild limitations Mild limitations Severe limitation, leans with trunk  Decreased 75%  Decreased 50%  Left lateral flexion Mod limitation w/pain Mod limitations Mild limitations Severe limitation, leans with trunk  Decreased 75% Decreased  50%  Right rotation 75% w/pain Mild limitation w/pain WFL pain at end range  Mild limitation  Decreased 25%  Decreased 25%  Left rotation 75% w/pain  Mild limitation w/pain  WFL pain at end range  Mild limitation  Decreased 25% Decreased  25%   (Blank rows = not tested)  UPPER EXTREMITY ROM: WFL, limited in IR and ER due to prior RC surgery years ago   UPPER EXTREMITY MMT:  MMT Right eval Left eval Right 01/05/22 Left 01/05/22 R 9/28 L 9/28  Shoulder flexion 4+ 4+ 5 5 5 5   Shoulder extension        Shoulder abduction 4+ very painful 4+ 4+ with  pain 5 5 5   Shoulder adduction        Shoulder extension        Shoulder internal rotation 4+ 4+ 5 5 5 5   Shoulder external rotation     5 5  Middle trapezius        Lower trapezius        Elbow flexion 5 5      Elbow extension 5 5  Wrist flexion        Wrist extension        Wrist ulnar deviation        Wrist radial deviation        Wrist pronation        Wrist supination        Grip strength         (Blank rows = not tested) 05/05/22 LE MMT  right 4+/5, left 4/5  SLR 40 degrees with pain in the legs and the back  Cannot cross her legs due to tight piriformis 25 degrees hip ER  Abdominal strength 2/5 unable to sit up or hold at 40 degrees  5x STS 21 seconds 06/14/22 TUG 10 seconds  5XSTS 16 seconds  Right 4+/5, left 4+/5  Manually I can cross her legs fully, she reports able to get   Socks on now, she can on her own without hands get her  Heels to mid patella  SLR to 55 degrees  Can hold plank 10 seconds elbows and toes  Had her A1C go down from 8.2 to 7.8   07/14/22  can hold plank 20 seconds x4  TUG 7 seconds  08/16/22  still very difficult to put socks on, is getting closer to being able to cross leg over.  Heels to superior patella. Weight today was 202#.  5XSTS 11 seconds.  Planks 30 seconds x3 and 20s x 1  09/20/22  A1C = 7.1, weight 198#, SLR 65 degrees, Can get both heels just above the patella, planks 35 seconds x 2   TODAY'S TREATMENT:  09/20/22 Weight 198# 2 laps brisk pace no rest Bike level 4 x 6 minutes with power bursts up to 500 watts x 3 20# low squat 3x5 20# axe chop Planks 35seconds x 2 Power ball slams Passive stretch HS and piriformis  09/14/22 Weight 199# Elliptical level 4 x 4 minutes 20# sit to stand from various heights lowest 15" Leg press 30# 2x10 single legs, 70# both legs On bosu ball balance and squats 2 ways Volleyball Planks 30 sec x 2 Passive stretch HS, piriformis and quads 20# axe chops  09/13/22 UBE level 4.5 x 6 minutes HS  curls 35# 2x10 Leg extension10# 3x10 20# farmer carry 1 lap each hand 5# overhead carry both arms together On bosu 3# biceps, alternating press and lateral raise Seated row 25# 2x15 25# lats 2x15 5# hip abduction, extension and flexion volleyball  09/08/22 UBE level 4 x 6 minutes 2 laps brisk walk Pushing tmill 3x30seconds 25# rows 25# lats 10# trunk rotation from low to high 20# trunk chops PAssive LE stretches  09/06/22 UBE level 4 x 6 minutes Gait 2 laps brisk pace no rest 60# leg press 3x10 35# leg curls 2x10 10# leg extension 2x10 50# resisted gait all directions Bike level 4 x 6 minutes with 3 power bursts 500watts, 400 watts, 455 watts On bosu balance and reaching and then squats 20# trunk chops 2x10  09/01/22 Bike level 4 with 3 power bursts x 6 minutes up to 350 watts Leg press 60#  On airex 15# straight arm pulls 20# AR press 60# leg press 3 x10 10# hip extension and abduction and flexion On bosu 3# biceps and overhead press Wobble board balance Planks 30s x 2  08/29/22 2 laps brisk pace Leg extension 15# 2x15 LEg curls 35# 3x10 15# straight arm pulls 20# AR press 10# hip extension and hip abduction 10# and 20# sit to  stand 25# rows 25# lats Tmill pushes 3x30s Passive HS and piriformis stretch  08/25/22 Gait 2 laps with 3# biceps and punches Seated row 25# Lats 25# 60# leg press 2x10 Worked on getting up from the floor able to do pretty well Planks 2x 30s Elliptical 4 minutes 10# hip extension, abduction and flexion WEight 200.5#  08/23/22 Gait 2 laps around the back building 15# straight arm pulls 25# lats and Rows Tmill push two ways Nustep level 6 x 5 minutes shooting for 350 steps On bosu 3# biceps, punches and abduction 10# hip extension and abduction  08/18/22 UBE level 4 x 6 minutes Rows 25# Lats 25# Black tband extension HS curls 25# 2x15 Leg extension 10# 2x15 On bosu 4# biceps, punches, ball toss and weighted ball  STM to  the right upper trap and rhomboid  08/16/22 Walking 2 laps brisk pace Balance on bosu 2 ways, balance, head turns and reaching  Airex plank side stepping and tandem gait LEg press 40# 2x15 PROM of the LE's Assessment as noted above  08/11/22 Weight 201# Elliptical level 4 x 3 minutes Leg curls 35# 2x15 Leg extension 15# 2x15 On airex 15# 2x12 Planks 4 x 20s cues to breath Feet on ball bridges 2x10 HS and piriformis stretch Partial eccentric sit ups 20# trunk axe chop Upper trap and levator stretches   08/09/22 Weight 202# 2 laps outside good pace one rest 15# straight arm pulls 20# AR press 10# hip extension abd, flexion 2x15 25# seated rows 25# lats 10# chest press 40# leg press Passive stretch to the LE's  07/21/22 Fast walk around the building no rest 10# hip extension, abduction and flexion Sit to stands 10# overhead press on sit fit  10# straight arm pulls 15# AR press 25# rows Black tband extension and abs HS, calf and piriformis stretch  07/19/22 Weight 203# Gait around the back building brisk pace, one short rest 60# leg press 2x15 35# straight arm pulls  35# triceps 5# hips 3 ways Tmill push and pulls 20s x 3 each 3# overhead stick carry Passive HS and piriformis stretch  07/14/22 Weight 204# Fast walk around the back building 10# Hip extension and abduction 15# AR press 15# straight arm pulls Leg press 60# 2x15 Planks 4x20 seconds On bosu balance 3# alternating work  07/12/22 Weight 207# Brisk walk around the back building without rest and able to carry on a conversation 35# leg curls 10# leg extension Passive stretch LE's Education in calories in and out, carbs, protein and fiber 25# rows 2x15 Lats 25# 2x15 Elliptical 3 minutes level 4   PATIENT EDUCATION:  Education details: POC Person educated: Patient Education method: Explanation Education comprehension: verbalized understanding   HOME EXERCISE PROGRAM: Access Code:  [email or web address] URL: [email or web address] Date: 12/01/2021 Prepared by: Cassie Freer   ASSESSMENT:  CLINICAL IMPRESSION: Patient with great test results, doing better with A1C, glucose, BP and weight compared to the last MD check.  She is now at 198# for the first time in over a year.  AS noted above her flexibility, strength, ability and function are greatly improved, she can now get up off the floor, still struggles to put on socks and to get up from supine, but overall much better, she does not have great flexibility and coordination, we are starting to talk about her doing a gym program on her own.  REHAB POTENTIAL: Good  CLINICAL DECISION MAKING: Stable/uncomplicated  EVALUATION COMPLEXITY: Low   GOALS: Goals reviewed with patient?  Yes  SHORT TERM GOALS: Target date: 12/29/21  Patient will be independent with initial HEP.  Goal status: MET  LONG TERM GOALS: Target date: 02/26/22  Patient will be independent with advanced/ongoing HEP to improve outcomes and carryover.  Goal status: continuing 09/20/22  2.  Patient will report 75% improvement in neck pain to improve QOL.   Baseline: 9/28- not even 50% improvement maybe 40% Goal status: met  3.  Patient will demonstrate full pain free cervical ROM to complete ADLs and play with grandchildren with more ease. Goal status: met 4.  Patient will demonstrate improved posture to decrease muscle imbalance and tightness.  Baseline: increased tightness along cervical paraspinals Goal status: progressing still tight piriformis 08/16/22  5.  Patient will report 80 on FOTO to demonstrate improved functional ability.  Baseline:  9/28- 64  Goal status: MET but has regressed as she is doing more and more  6.  Will demonstrate improved core strength as evidenced by passing double leg lower test  Baseline:  Goal status: progressing 08/16/22 7.  Average 11,000 steps a day on her fit bit over a month period.  Met  08/25/22   PLAN: PT FREQUENCY: 2x/week  PT DURATION: 4 weeks  PLANNED INTERVENTIONS: Therapeutic exercises, Therapeutic activity, Neuromuscular re-education, Balance training, Gait training, Patient/Family education, Self Care, Joint mobilization, Dry Needling, Spinal manipulation, Spinal mobilization, Cryotherapy, Moist heat, Taping, Vasopneumatic device, Traction, Ionotophoresis 4mg /ml Dexamethasone, and Manual therapy  PLAN FOR NEXT SESSION:  work on her doing independent gym if possibel and safe Stacie Glaze, PT 09/20/2022, 8:40 AM   [facility] [place], [place], [address] Phone: [phone]   Fax:  [phone]

## 2022-09-22 ENCOUNTER — Ambulatory Visit: Payer: Medicare Other | Admitting: Physical Therapy

## 2022-09-22 ENCOUNTER — Encounter: Payer: Self-pay | Admitting: Physical Therapy

## 2022-09-22 DIAGNOSIS — M542 Cervicalgia: Secondary | ICD-10-CM

## 2022-09-22 DIAGNOSIS — G8929 Other chronic pain: Secondary | ICD-10-CM | POA: Diagnosis not present

## 2022-09-22 DIAGNOSIS — M6281 Muscle weakness (generalized): Secondary | ICD-10-CM | POA: Diagnosis not present

## 2022-09-22 DIAGNOSIS — R519 Headache, unspecified: Secondary | ICD-10-CM

## 2022-09-22 NOTE — Therapy (Signed)
OUTPATIENT PHYSICAL THERAPY CERVICAL TREATMENT    Patient Name: Veronica Galvan MRN: [id number] DOB:20-Jun-1955, 67 y.o., female Today's Date: 09/22/2022  PT End of Session - 09/22/22 0756     Visit Number 77    Date for PT Re-Evaluation 10/21/22    PT Start Time 0756    PT Stop Time 0845    PT Time Calculation (min) 49 min    Activity Tolerance Patient tolerated treatment well    Behavior During Therapy [facility] for tasks assessed/performed               Past Medical History:  Diagnosis Date   Allergy    Anxiety    Arthritis    hands   Cataract    Diabetes mellitus without complication (HCC)    type 2   Family history of adverse reaction to anesthesia    son has malignant hyperthermia, daughter does not daughter recently had c section without problems   GERD (gastroesophageal reflux disease)    Headache    sinus   Hyperlipidemia    Hypertension    PONV (postoperative nausea and vomiting)    nausea only   Ulcer, stomach peptic yrs ago   Past Surgical History:  Procedure Laterality Date   APPENDECTOMY     both hells bone spur repair     both heels with metal clips   both shoulder rotator cuff repair     CESAREAN SECTION     x 1   COLONOSCOPY WITH PROPOFOL N/A 09/07/2016   Procedure: COLONOSCOPY WITH PROPOFOL;  Surgeon: Charolett Bumpers, MD;  Location: [facility];  Service: Endoscopy;  Laterality: N/A;   colonscopy  06/2011   polyps   EYE SURGERY     FRACTURE SURGERY     TUBAL LIGATION     VESICO-VAGINAL FISTULA REPAIR     Patient Active Problem List   Diagnosis Date Noted   Gastroesophageal reflux disease 11/16/2021   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 08/18/2021   Dermatofibroma 08/18/2021   History of malignant neoplasm of skin 08/18/2021   Lentigo 08/18/2021   Melanocytic nevi of trunk 08/18/2021   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   Rosacea 08/18/2021   Actinic keratosis 08/18/2021   Sensorineural  hearing loss (SNHL) of left ear with unrestricted hearing of right ear 07/26/2021   Tinnitus of left ear 07/26/2021   Acute recurrent maxillary sinusitis 06/22/2021   Primary hypertension 01/12/2021   Hyperlipidemia associated with type 2 diabetes mellitus (HCC) 01/12/2021   Arthritis 01/12/2021   Type II diabetes mellitus (HCC) 11/25/2019   Ingrown toenail 09/05/2018   Neck pain 01/29/2018   Tick bite 10/24/2017    PCP: Ria Clock  REFERRING PROVIDER: Ocie Doyne  REFERRING DIAG: M54.2  THERAPY DIAG:  Cervicalgia  Chronic intractable headache, unspecified headache type  Muscle weakness (generalized)  Rationale for Evaluation and Treatment Rehabilitation  ONSET DATE: 11/08/21  SUBJECTIVE:  SUBJECTIVE STATEMENT: I have a terrible HA, woke up with pain  PERTINENT HISTORY:  Arthritis, HTN, bilateral rotator cuff surgery 15-44yrs ago, DM  PAIN:  Are you having pain? Yes: NPRS scale: 6/10 Pain location: head and neck, shoulders  Pain description: tight  Aggravating factors: moving Relieving factors: massage, standing in the shower  WEIGHT BEARING RESTRICTIONS No  FALLS:  Has patient fallen in last 6 months? No  LIVING ENVIRONMENT: Lives with: lives alone Lives in: House/apartment Stairs: Yes: External: 3 steps; can reach both Has following equipment at home: None  OCCUPATION: reitred  PLOF: Independent  PATIENT GOALS to loosen it up, find out what is causing my headaches   OBJECTIVE:   DIAGNOSTIC FINDINGS:   FINDINGS: Brain: No acute infarct, mass effect or extra-axial collection. No acute or chronic hemorrhage. Normal white matter signal, parenchymal volume and CSF spaces. The midline structures are normal.  Skull and upper cervical spine: Normal  calvarium and skull base. Visualized upper cervical spine and soft tissues are normal.   Sinuses/Orbits:No paranasal sinus fluid levels or advanced mucosal thickening. No mastoid or middle ear effusion. Normal orbits.   IMPRESSION: Normal brain MRI.  PATIENT SURVEYS:  FOTO 53/100 ,  April 08, 2022 has been up to 56 and today was 49 but has been doing a lot and lifting and is back doing much more than she was.  January 4,2024 57/100 she does report tha tshe wnet backwards from her previous due to her being able to do so much more but is hurting more   COGNITION: Overall cognitive status: Within functional limits for tasks assessed  SENSATION: WFL  POSTURE: rounded shoulders, forward head, and increased thoracic kyphosis  PALPATION: TTP and sore along upper trap and cervical paraspinals, trigger points in R and L UT   CERVICAL ROM:   Active ROM A/PROM (deg) eval 12/30/21 01/05/22 9/28 10/31 04/05/22  Flexion 100% no pain WFL WFL WNL   WNL  Extension Mild limitations w/pain [facility]  WFL WNL   WNL  Right lateral flexion Mod limitation w/pain Mild limitations Mild limitations Severe limitation, leans with trunk  Decreased 75%  Decreased 50%  Left lateral flexion Mod limitation w/pain Mod limitations Mild limitations Severe limitation, leans with trunk  Decreased 75% Decreased  50%  Right rotation 75% w/pain Mild limitation w/pain WFL pain at end range  Mild limitation  Decreased 25%  Decreased 25%  Left rotation 75% w/pain  Mild limitation w/pain  WFL pain at end range  Mild limitation  Decreased 25% Decreased  25%   (Blank rows = not tested)  UPPER EXTREMITY ROM: WFL, limited in IR and ER due to prior RC surgery years ago   UPPER EXTREMITY MMT:  MMT Right eval Left eval Right 01/05/22 Left 01/05/22 R 9/28 L 9/28  Shoulder flexion 4+ 4+ 5 5 5 5   Shoulder extension        Shoulder abduction 4+ very painful 4+ 4+ with pain 5 5 5   Shoulder adduction        Shoulder extension         Shoulder internal rotation 4+ 4+ 5 5 5 5   Shoulder external rotation     5 5  Middle trapezius        Lower trapezius        Elbow flexion 5 5      Elbow extension 5 5      Wrist flexion        Wrist extension  Wrist ulnar deviation        Wrist radial deviation        Wrist pronation        Wrist supination        Grip strength         (Blank rows = not tested) 05/05/22 LE MMT  right 4+/5, left 4/5  SLR 40 degrees with pain in the legs and the back  Cannot cross her legs due to tight piriformis 25 degrees hip ER  Abdominal strength 2/5 unable to sit up or hold at 40 degrees  5x STS 21 seconds 06/14/22 TUG 10 seconds  5XSTS 16 seconds  Right 4+/5, left 4+/5  Manually I can cross her legs fully, she reports able to get   Socks on now, she can on her own without hands get her  Heels to mid patella  SLR to 55 degrees  Can hold plank 10 seconds elbows and toes  Had her A1C go down from 8.2 to 7.8   07/14/22  can hold plank 20 seconds x4  TUG 7 seconds  08/16/22  still very difficult to put socks on, is getting closer to being able to cross leg over.  Heels to superior patella. Weight today was 202#.  5XSTS 11 seconds.  Planks 30 seconds x3 and 20s x 1  09/20/22  A1C = 7.1, weight 198#, SLR 65 degrees, Can get both heels just above the patella, planks 35 seconds x 2   TODAY'S TREATMENT:  09/22/22 2 laps brisk pace but had to slow down on the second lap 25# leg curls 3x10 10# leg extension 2x15 60# leg press, 20# single leg with piriformis stretch STM to the upper traps, rhomboids and cervical area Passive stretch of the pectorals, piriformis and the HS  09/20/22 Weight 198# 2 laps brisk pace no rest Bike level 4 x 6 minutes with power bursts up to 500 watts x 3 20# low squat 3x5 20# axe chop Planks 35seconds x 2 Power ball slams Passive stretch HS and piriformis  09/14/22 Weight 199# Elliptical level 4 x 4 minutes 20# sit to stand from various heights lowest 15" Leg  press 30# 2x10 single legs, 70# both legs On bosu ball balance and squats 2 ways Volleyball Planks 30 sec x 2 Passive stretch HS, piriformis and quads 20# axe chops  09/13/22 UBE level 4.5 x 6 minutes HS curls 35# 2x10 Leg extension10# 3x10 20# farmer carry 1 lap each hand 5# overhead carry both arms together On bosu 3# biceps, alternating press and lateral raise Seated row 25# 2x15 25# lats 2x15 5# hip abduction, extension and flexion volleyball  09/08/22 UBE level 4 x 6 minutes 2 laps brisk walk Pushing tmill 3x30seconds 25# rows 25# lats 10# trunk rotation from low to high 20# trunk chops PAssive LE stretches  09/06/22 UBE level 4 x 6 minutes Gait 2 laps brisk pace no rest 60# leg press 3x10 35# leg curls 2x10 10# leg extension 2x10 50# resisted gait all directions Bike level 4 x 6 minutes with 3 power bursts 500watts, 400 watts, 455 watts On bosu balance and reaching and then squats 20# trunk chops 2x10  09/01/22 Bike level 4 with 3 power bursts x 6 minutes up to 350 watts Leg press 60#  On airex 15# straight arm pulls 20# AR press 60# leg press 3 x10 10# hip extension and abduction and flexion On bosu 3# biceps and overhead press Wobble board balance Planks 30s x 2  08/29/22 2 laps brisk pace Leg extension 15# 2x15 LEg curls 35# 3x10 15# straight arm pulls 20# AR press 10# hip extension and hip abduction 10# and 20# sit to stand 25# rows 25# lats Tmill pushes 3x30s Passive HS and piriformis stretch  08/25/22 Gait 2 laps with 3# biceps and punches Seated row 25# Lats 25# 60# leg press 2x10 Worked on getting up from the floor able to do pretty well Planks 2x 30s Elliptical 4 minutes 10# hip extension, abduction and flexion WEight 200.5#  08/23/22 Gait 2 laps around the back building 15# straight arm pulls 25# lats and Rows Tmill push two ways Nustep level 6 x 5 minutes shooting for 350 steps On bosu 3# biceps, punches and abduction 10# hip  extension and abduction  08/18/22 UBE level 4 x 6 minutes Rows 25# Lats 25# Black tband extension HS curls 25# 2x15 Leg extension 10# 2x15 On bosu 4# biceps, punches, ball toss and weighted ball  STM to the right upper trap and rhomboid  08/16/22 Walking 2 laps brisk pace Balance on bosu 2 ways, balance, head turns and reaching  Airex plank side stepping and tandem gait LEg press 40# 2x15 PROM of the LE's Assessment as noted above  08/11/22 Weight 201# Elliptical level 4 x 3 minutes Leg curls 35# 2x15 Leg extension 15# 2x15 On airex 15# 2x12 Planks 4 x 20s cues to breath Feet on ball bridges 2x10 HS and piriformis stretch Partial eccentric sit ups 20# trunk axe chop Upper trap and levator stretches   08/09/22 Weight 202# 2 laps outside good pace one rest 15# straight arm pulls 20# AR press 10# hip extension abd, flexion 2x15 25# seated rows 25# lats 10# chest press 40# leg press Passive stretch to the LE's  07/21/22 Fast walk around the building no rest 10# hip extension, abduction and flexion Sit to stands 10# overhead press on sit fit  10# straight arm pulls 15# AR press 25# rows Black tband extension and abs HS, calf and piriformis stretch  07/19/22 Weight 203# Gait around the back building brisk pace, one short rest 60# leg press 2x15 35# straight arm pulls  35# triceps 5# hips 3 ways Tmill push and pulls 20s x 3 each 3# overhead stick carry Passive HS and piriformis stretch  07/14/22 Weight 204# Fast walk around the back building 10# Hip extension and abduction 15# AR press 15# straight arm pulls Leg press 60# 2x15 Planks 4x20 seconds On bosu balance 3# alternating work  07/12/22 Weight 207# Brisk walk around the back building without rest and able to carry on a conversation 35# leg curls 10# leg extension Passive stretch LE's Education in calories in and out, carbs, protein and fiber 25# rows 2x15 Lats 25# 2x15 Elliptical 3 minutes  level 4   PATIENT EDUCATION:  Education details: POC Person educated: Patient Education method: Explanation Education comprehension: verbalized understanding   HOME EXERCISE PROGRAM: Access Code: [email or web address] URL: [email or web address] Date: 12/01/2021 Prepared by: Cassie Freer   ASSESSMENT:  CLINICAL IMPRESSION: Patient had a bad HA today, pretty significant reports that this is her first significant HA in a month.  I worked on the traps with STM, still needing flexibility and would like to teach her the machines  REHAB POTENTIAL: Good  CLINICAL DECISION MAKING: Stable/uncomplicated  EVALUATION COMPLEXITY: Low   GOALS: Goals reviewed with patient? Yes  SHORT TERM GOALS: Target date: 12/29/21  Patient will be independent with initial HEP.  Goal status: MET  LONG TERM  GOALS: Target date: 02/26/22  Patient will be independent with advanced/ongoing HEP to improve outcomes and carryover.  Goal status: continuing 09/20/22  2.  Patient will report 75% improvement in neck pain to improve QOL.   Baseline: 9/28- not even 50% improvement maybe 40% Goal status: met  3.  Patient will demonstrate full pain free cervical ROM to complete ADLs and play with grandchildren with more ease. Goal status: met 4.  Patient will demonstrate improved posture to decrease muscle imbalance and tightness.  Baseline: increased tightness along cervical paraspinals Goal status: progressing still tight piriformis 08/16/22  5.  Patient will report 91 on FOTO to demonstrate improved functional ability.  Baseline:  9/28- 64  Goal status: MET but has regressed as she is doing more and more  6.  Will demonstrate improved core strength as evidenced by passing double leg lower test  Baseline:  Goal status: progressing 08/16/22 7.  Average 11,000 steps a day on her fit bit over a month period.  Met 08/25/22   PLAN: PT FREQUENCY: 2x/week  PT DURATION: 4 weeks  PLANNED INTERVENTIONS:  Therapeutic exercises, Therapeutic activity, Neuromuscular re-education, Balance training, Gait training, Patient/Family education, Self Care, Joint mobilization, Dry Needling, Spinal manipulation, Spinal mobilization, Cryotherapy, Moist heat, Taping, Vasopneumatic device, Traction, Ionotophoresis 4mg /ml Dexamethasone, and Manual therapy  PLAN FOR NEXT SESSION:  work on her doing independent gym if possibel and safe Stacie Glaze, PT 09/22/2022, 7:57 AM   [facility] [address] [facility]. [place], [place], [address] Phone: [phone]   Fax:  [phone]

## 2022-09-29 ENCOUNTER — Ambulatory Visit: Payer: Medicare Other | Admitting: Physical Therapy

## 2022-09-30 ENCOUNTER — Ambulatory Visit: Payer: Medicare Other | Admitting: Physical Therapy

## 2022-10-04 ENCOUNTER — Ambulatory Visit: Payer: Medicare Other | Attending: Family | Admitting: Physical Therapy

## 2022-10-04 ENCOUNTER — Encounter: Payer: Self-pay | Admitting: Physical Therapy

## 2022-10-04 DIAGNOSIS — R519 Headache, unspecified: Secondary | ICD-10-CM | POA: Diagnosis not present

## 2022-10-04 DIAGNOSIS — M542 Cervicalgia: Secondary | ICD-10-CM | POA: Insufficient documentation

## 2022-10-04 DIAGNOSIS — G8929 Other chronic pain: Secondary | ICD-10-CM | POA: Insufficient documentation

## 2022-10-04 DIAGNOSIS — M6281 Muscle weakness (generalized): Secondary | ICD-10-CM | POA: Insufficient documentation

## 2022-10-04 NOTE — Therapy (Signed)
OUTPATIENT PHYSICAL THERAPY CERVICAL TREATMENT    Patient Name: Veronica Galvan MRN: [id number] DOB:[birth year], 67 y.o., female Today's Date: 10/04/2022  PT End of Session - 10/04/22 1311     Visit Number 78    Date for PT Re-Evaluation 10/21/22    PT Start Time 1311    PT Stop Time 1357    PT Time Calculation (min) 46 min    Activity Tolerance Patient tolerated treatment well    Behavior During Therapy WFL for tasks assessed/performed               Past Medical History:  Diagnosis Date   Allergy    Anxiety    Arthritis    hands   Cataract    Diabetes mellitus without complication (HCC)    type 2   Family history of adverse reaction to anesthesia    son has malignant hyperthermia, daughter does not daughter recently had c section without problems   GERD (gastroesophageal reflux disease)    Headache    sinus   Hyperlipidemia    Hypertension    PONV (postoperative nausea and vomiting)    nausea only   Ulcer, stomach peptic yrs ago   Past Surgical History:  Procedure Laterality Date   APPENDECTOMY     both hells bone spur repair     both heels with metal clips   both shoulder rotator cuff repair     CESAREAN SECTION     x 1   COLONOSCOPY WITH PROPOFOL N/A 09/07/2016   Procedure: COLONOSCOPY WITH PROPOFOL;  Surgeon: Charolett Bumpers, MD;  Location: [facility];  Service: Endoscopy;  Laterality: N/A;   colonscopy  06/2011   polyps   EYE SURGERY     FRACTURE SURGERY     TUBAL LIGATION     VESICO-VAGINAL FISTULA REPAIR     Patient Active Problem List   Diagnosis Date Noted   Gastroesophageal reflux disease 11/16/2021   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 08/18/2021   Dermatofibroma 08/18/2021   History of malignant neoplasm of skin 08/18/2021   Lentigo 08/18/2021   Melanocytic nevi of trunk 08/18/2021   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   Rosacea 08/18/2021   Actinic keratosis 08/18/2021   Sensorineural hearing  loss (SNHL) of left ear with unrestricted hearing of right ear 07/26/2021   Tinnitus of left ear 07/26/2021   Acute recurrent maxillary sinusitis 06/22/2021   Primary hypertension 01/12/2021   Hyperlipidemia associated with type 2 diabetes mellitus (HCC) 01/12/2021   Arthritis 01/12/2021   Type II diabetes mellitus (HCC) 11/25/2019   Ingrown toenail 09/05/2018   Neck pain 01/29/2018   Tick bite 10/24/2017    PCP: Ria Clock  REFERRING PROVIDER: Ocie Doyne  REFERRING DIAG: M54.2  THERAPY DIAG:  Cervicalgia  Chronic intractable headache, unspecified headache type  Muscle weakness (generalized)  Rationale for Evaluation and Treatment Rehabilitation  ONSET DATE: 11/08/21  SUBJECTIVE:  SUBJECTIVE STATEMENT: Patient comes in and had to miss last week, reports that she was very sidk and reports that she feels weak an dnot able to do much PERTINENT HISTORY:  Arthritis, HTN, bilateral rotator cuff surgery 15-25yrs ago, DM  PAIN:  Are you having pain? Yes: NPRS scale: 6/10 Pain location: head and neck, shoulders  Pain description: tight  Aggravating factors: moving Relieving factors: massage, standing in the shower  WEIGHT BEARING RESTRICTIONS No  FALLS:  Has patient fallen in last 6 months? No  LIVING ENVIRONMENT: Lives with: lives alone Lives in: House/apartment Stairs: Yes: External: 3 steps; can reach both Has following equipment at home: None  OCCUPATION: reitred  PLOF: Independent  PATIENT GOALS to loosen it up, find out what is causing my headaches   OBJECTIVE:   DIAGNOSTIC FINDINGS:   FINDINGS: Brain: No acute infarct, mass effect or extra-axial collection. No acute or chronic hemorrhage. Normal white matter signal, parenchymal volume and CSF spaces.  The midline structures are normal.  Skull and upper cervical spine: Normal calvarium and skull base. Visualized upper cervical spine and soft tissues are normal.   Sinuses/Orbits:No paranasal sinus fluid levels or advanced mucosal thickening. No mastoid or middle ear effusion. Normal orbits.   IMPRESSION: Normal brain MRI.  PATIENT SURVEYS:  FOTO 53/100 ,  April 08, 2022 has been up to 37 and today was 3 but has been doing a lot and lifting and is back doing much more than she was.  January 4,2024 57/100 she does report tha tshe wnet backwards from her previous due to her being able to do so much more but is hurting more   COGNITION: Overall cognitive status: Within functional limits for tasks assessed  SENSATION: WFL  POSTURE: rounded shoulders, forward head, and increased thoracic kyphosis  PALPATION: TTP and sore along upper trap and cervical paraspinals, trigger points in R and L UT   CERVICAL ROM:   Active ROM A/PROM (deg) eval 12/30/21 01/05/22 9/28 10/31 04/05/22  Flexion 100% no pain WFL WFL WNL   WNL  Extension Mild limitations w/pain [facility]  WFL WNL   WNL  Right lateral flexion Mod limitation w/pain Mild limitations Mild limitations Severe limitation, leans with trunk  Decreased 75%  Decreased 50%  Left lateral flexion Mod limitation w/pain Mod limitations Mild limitations Severe limitation, leans with trunk  Decreased 75% Decreased  50%  Right rotation 75% w/pain Mild limitation w/pain WFL pain at end range  Mild limitation  Decreased 25%  Decreased 25%  Left rotation 75% w/pain  Mild limitation w/pain  WFL pain at end range  Mild limitation  Decreased 25% Decreased  25%   (Blank rows = not tested)  UPPER EXTREMITY ROM: WFL, limited in IR and ER due to prior RC surgery years ago   UPPER EXTREMITY MMT:  MMT Right eval Left eval Right 01/05/22 Left 01/05/22 R 9/28 L 9/28  Shoulder flexion 4+ 4+ 5 5 5 5   Shoulder extension        Shoulder abduction 4+ very painful  4+ 4+ with pain 5 5 5   Shoulder adduction        Shoulder extension        Shoulder internal rotation 4+ 4+ 5 5 5 5   Shoulder external rotation     5 5  Middle trapezius        Lower trapezius        Elbow flexion 5 5      Elbow extension 5 5  Wrist flexion        Wrist extension        Wrist ulnar deviation        Wrist radial deviation        Wrist pronation        Wrist supination        Grip strength         (Blank rows = not tested) 05/05/22 LE MMT  right 4+/5, left 4/5  SLR 40 degrees with pain in the legs and the back  Cannot cross her legs due to tight piriformis 25 degrees hip ER  Abdominal strength 2/5 unable to sit up or hold at 40 degrees  5x STS 21 seconds 06/14/22 TUG 10 seconds  5XSTS 16 seconds  Right 4+/5, left 4+/5  Manually I can cross her legs fully, she reports able to get   Socks on now, she can on her own without hands get her  Heels to mid patella  SLR to 55 degrees  Can hold plank 10 seconds elbows and toes  Had her A1C go down from 8.2 to 7.8   07/14/22  can hold plank 20 seconds x4  TUG 7 seconds  08/16/22  still very difficult to put socks on, is getting closer to being able to cross leg over.  Heels to superior patella. Weight today was 202#.  5XSTS 11 seconds.  Planks 30 seconds x3 and 20s x 1  09/20/22  A1C = 7.1, weight 198#, SLR 65 degrees, Can get both heels just above the patella, planks 35 seconds x 2   TODAY'S TREATMENT:  10/04/22 Elliptical 2 minutes level 3 Nustep level 4 x 5 minutes Standing on airex 10# rows and straight arm pulls Seated row 20# 2x15 Leg press 30# 2x15 Passive stretch to calves, HS and piriformis She was very tight and had difficulty, we gave handout and put in her phone that she really needed, really reinforced this  09/22/22 2 laps brisk pace but had to slow down on the second lap 25# leg curls 3x10 10# leg extension 2x15 60# leg press, 20# single leg with piriformis stretch STM to the upper traps, rhomboids  and cervical area Passive stretch of the pectorals, piriformis and the HS  09/20/22 Weight 198# 2 laps brisk pace no rest Bike level 4 x 6 minutes with power bursts up to 500 watts x 3 20# low squat 3x5 20# axe chop Planks 35seconds x 2 Power ball slams Passive stretch HS and piriformis  09/14/22 Weight 199# Elliptical level 4 x 4 minutes 20# sit to stand from various heights lowest 15" Leg press 30# 2x10 single legs, 70# both legs On bosu ball balance and squats 2 ways Volleyball Planks 30 sec x 2 Passive stretch HS, piriformis and quads 20# axe chops  09/13/22 UBE level 4.5 x 6 minutes HS curls 35# 2x10 Leg extension10# 3x10 20# farmer carry 1 lap each hand 5# overhead carry both arms together On bosu 3# biceps, alternating press and lateral raise Seated row 25# 2x15 25# lats 2x15 5# hip abduction, extension and flexion volleyball  09/08/22 UBE level 4 x 6 minutes 2 laps brisk walk Pushing tmill 3x30seconds 25# rows 25# lats 10# trunk rotation from low to high 20# trunk chops PAssive LE stretches  09/06/22 UBE level 4 x 6 minutes Gait 2 laps brisk pace no rest 60# leg press 3x10 35# leg curls 2x10 10# leg extension 2x10 50# resisted gait all directions Bike level 4 x 6  minutes with 3 power bursts 500watts, 400 watts, 455 watts On bosu balance and reaching and then squats 20# trunk chops 2x10  09/01/22 Bike level 4 with 3 power bursts x 6 minutes up to 350 watts Leg press 60#  On airex 15# straight arm pulls 20# AR press 60# leg press 3 x10 10# hip extension and abduction and flexion On bosu 3# biceps and overhead press Wobble board balance Planks 30s x 2  08/29/22 2 laps brisk pace Leg extension 15# 2x15 LEg curls 35# 3x10 15# straight arm pulls 20# AR press 10# hip extension and hip abduction 10# and 20# sit to stand 25# rows 25# lats Tmill pushes 3x30s Passive HS and piriformis stretch  08/25/22 Gait 2 laps with 3# biceps and punches Seated  row 25# Lats 25# 60# leg press 2x10 Worked on getting up from the floor able to do pretty well Planks 2x 30s Elliptical 4 minutes 10# hip extension, abduction and flexion WEight 200.5#  08/23/22 Gait 2 laps around the back building 15# straight arm pulls 25# lats and Rows Tmill push two ways Nustep level 6 x 5 minutes shooting for 350 steps On bosu 3# biceps, punches and abduction 10# hip extension and abduction  08/18/22 UBE level 4 x 6 minutes Rows 25# Lats 25# Black tband extension HS curls 25# 2x15 Leg extension 10# 2x15 On bosu 4# biceps, punches, ball toss and weighted ball  STM to the right upper trap and rhomboid  08/16/22 Walking 2 laps brisk pace Balance on bosu 2 ways, balance, head turns and reaching  Airex plank side stepping and tandem gait LEg press 40# 2x15 PROM of the LE's Assessment as noted above  08/11/22 Weight 201# Elliptical level 4 x 3 minutes Leg curls 35# 2x15 Leg extension 15# 2x15 On airex 15# 2x12 Planks 4 x 20s cues to breath Feet on ball bridges 2x10 HS and piriformis stretch Partial eccentric sit ups 20# trunk axe chop Upper trap and levator stretches   08/09/22 Weight 202# 2 laps outside good pace one rest 15# straight arm pulls 20# AR press 10# hip extension abd, flexion 2x15 25# seated rows 25# lats 10# chest press 40# leg press Passive stretch to the LE's  07/21/22 Fast walk around the building no rest 10# hip extension, abduction and flexion Sit to stands 10# overhead press on sit fit  10# straight arm pulls 15# AR press 25# rows Black tband extension and abs HS, calf and piriformis stretch  07/19/22 Weight 203# Gait around the back building brisk pace, one short rest 60# leg press 2x15 35# straight arm pulls  35# triceps 5# hips 3 ways Tmill push and pulls 20s x 3 each 3# overhead stick carry Passive HS and piriformis stretch  07/14/22 Weight 204# Fast walk around the back building 10# Hip extension and  abduction 15# AR press 15# straight arm pulls Leg press 60# 2x15 Planks 4x20 seconds On bosu balance 3# alternating work  07/12/22 Weight 207# Brisk walk around the back building without rest and able to carry on a conversation 35# leg curls 10# leg extension Passive stretch LE's Education in calories in and out, carbs, protein and fiber 25# rows 2x15 Lats 25# 2x15 Elliptical 3 minutes level 4   PATIENT EDUCATION:  Education details: POC Person educated: Patient Education method: Explanation Education comprehension: verbalized understanding   HOME EXERCISE PROGRAM: Access Code: [email or web address] URL: [email or web address] Date: 12/01/2021 Prepared by: Cassie Freer   ASSESSMENT:  CLINICAL IMPRESSION: Patient comes in  and was sick all last week, she is not feeling well and is really regressed with her flexibility, I emphasized how important this is, and gave handout and then had her put in her phone to do, I was worried that she regressed so fast with this not doing anything for a week  REHAB POTENTIAL: Good  CLINICAL DECISION MAKING: Stable/uncomplicated  EVALUATION COMPLEXITY: Low   GOALS: Goals reviewed with patient? Yes  SHORT TERM GOALS: Target date: 12/29/21  Patient will be independent with initial HEP.  Goal status: MET  LONG TERM GOALS: Target date: 02/26/22  Patient will be independent with advanced/ongoing HEP to improve outcomes and carryover.  Goal status: continuing 09/20/22  2.  Patient will report 75% improvement in neck pain to improve QOL.   Baseline: 9/28- not even 50% improvement maybe 40% Goal status: met  3.  Patient will demonstrate full pain free cervical ROM to complete ADLs and play with grandchildren with more ease. Goal status: met 4.  Patient will demonstrate improved posture to decrease muscle imbalance and tightness.  Baseline: increased tightness along cervical paraspinals Goal status: progressing still tight  piriformis 08/16/22  5.  Patient will report 53 on FOTO to demonstrate improved functional ability.  Baseline:  9/28- 64  Goal status: MET but has regressed as she is doing more and more  6.  Will demonstrate improved core strength as evidenced by passing double leg lower test  Baseline:  Goal status: progressing 08/16/22 7.  Average 11,000 steps a day on her fit bit over a month period.  Met 08/25/22   PLAN: PT FREQUENCY: 2x/week  PT DURATION: 4 weeks  PLANNED INTERVENTIONS: Therapeutic exercises, Therapeutic activity, Neuromuscular re-education, Balance training, Gait training, Patient/Family education, Self Care, Joint mobilization, Dry Needling, Spinal manipulation, Spinal mobilization, Cryotherapy, Moist heat, Taping, Vasopneumatic device, Traction, Ionotophoresis 4mg /ml Dexamethasone, and Manual therapy  PLAN FOR NEXT SESSION:  work on her doing independent gym if possibel and safe Stacie Glaze, PT 10/04/2022, 1:18 PM   [facility] [address] [facility]. [place], [place], [address] Phone: [phone]   Fax:  [phone]

## 2022-10-06 ENCOUNTER — Encounter: Payer: Self-pay | Admitting: Physical Therapy

## 2022-10-06 ENCOUNTER — Ambulatory Visit: Payer: Medicare Other | Admitting: Physical Therapy

## 2022-10-06 DIAGNOSIS — R519 Headache, unspecified: Secondary | ICD-10-CM | POA: Diagnosis not present

## 2022-10-06 DIAGNOSIS — G8929 Other chronic pain: Secondary | ICD-10-CM | POA: Diagnosis not present

## 2022-10-06 DIAGNOSIS — M6281 Muscle weakness (generalized): Secondary | ICD-10-CM

## 2022-10-06 DIAGNOSIS — M542 Cervicalgia: Secondary | ICD-10-CM | POA: Diagnosis not present

## 2022-10-06 NOTE — Therapy (Signed)
OUTPATIENT PHYSICAL THERAPY CERVICAL TREATMENT    Patient Name: Veronica Galvan MRN: [id number] DOB:July 11, 1955, 67 y.o., female Today's Date: 10/06/2022  PT End of Session - 10/06/22 1402     Visit Number 79    Date for PT Re-Evaluation 10/21/22    PT Start Time 1400    PT Stop Time 1445    PT Time Calculation (min) 45 min    Activity Tolerance Patient tolerated treatment well    Behavior During Therapy WFL for tasks assessed/performed               Past Medical History:  Diagnosis Date   Allergy    Anxiety    Arthritis    hands   Cataract    Diabetes mellitus without complication (HCC)    type 2   Family history of adverse reaction to anesthesia    son has malignant hyperthermia, daughter does not daughter recently had c section without problems   GERD (gastroesophageal reflux disease)    Headache    sinus   Hyperlipidemia    Hypertension    PONV (postoperative nausea and vomiting)    nausea only   Ulcer, stomach peptic yrs ago   Past Surgical History:  Procedure Laterality Date   APPENDECTOMY     both hells bone spur repair     both heels with metal clips   both shoulder rotator cuff repair     CESAREAN SECTION     x 1   COLONOSCOPY WITH PROPOFOL N/A 09/07/2016   Procedure: COLONOSCOPY WITH PROPOFOL;  Surgeon: Charolett Bumpers, MD;  Location: [facility];  Service: Endoscopy;  Laterality: N/A;   colonscopy  06/2011   polyps   EYE SURGERY     FRACTURE SURGERY     TUBAL LIGATION     VESICO-VAGINAL FISTULA REPAIR     Patient Active Problem List   Diagnosis Date Noted   Gastroesophageal reflux disease 11/16/2021   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 08/18/2021   Dermatofibroma 08/18/2021   History of malignant neoplasm of skin 08/18/2021   Lentigo 08/18/2021   Melanocytic nevi of trunk 08/18/2021   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   Rosacea 08/18/2021   Actinic keratosis 08/18/2021   Sensorineural hearing  loss (SNHL) of left ear with unrestricted hearing of right ear 07/26/2021   Tinnitus of left ear 07/26/2021   Acute recurrent maxillary sinusitis 06/22/2021   Primary hypertension 01/12/2021   Hyperlipidemia associated with type 2 diabetes mellitus (HCC) 01/12/2021   Arthritis 01/12/2021   Type II diabetes mellitus (HCC) 11/25/2019   Ingrown toenail 09/05/2018   Neck pain 01/29/2018   Tick bite 10/24/2017    PCP: Ria Clock  REFERRING PROVIDER: Ocie Doyne  REFERRING DIAG: M54.2  THERAPY DIAG:  Cervicalgia  Chronic intractable headache, unspecified headache type  Muscle weakness (generalized)  Rationale for Evaluation and Treatment Rehabilitation  ONSET DATE: 11/08/21  SUBJECTIVE:  SUBJECTIVE STATEMENT: Patient reports that she feels much better but still hurting, she does report that she tried the stretches PERTINENT HISTORY:  Arthritis, HTN, bilateral rotator cuff surgery 15-50yrs ago, DM  PAIN:  Are you having pain? Yes: NPRS scale: 5/10 Pain location: head and neck, shoulders  Pain description: tight  Aggravating factors: moving Relieving factors: massage, standing in the shower  WEIGHT BEARING RESTRICTIONS No  FALLS:  Has patient fallen in last 6 months? No  LIVING ENVIRONMENT: Lives with: lives alone Lives in: House/apartment Stairs: Yes: External: 3 steps; can reach both Has following equipment at home: None  OCCUPATION: reitred  PLOF: Independent  PATIENT GOALS to loosen it up, find out what is causing my headaches   OBJECTIVE:   DIAGNOSTIC FINDINGS:   FINDINGS: Brain: No acute infarct, mass effect or extra-axial collection. No acute or chronic hemorrhage. Normal white matter signal, parenchymal volume and CSF spaces. The midline structures are  normal.  Skull and upper cervical spine: Normal calvarium and skull base. Visualized upper cervical spine and soft tissues are normal.   Sinuses/Orbits:No paranasal sinus fluid levels or advanced mucosal thickening. No mastoid or middle ear effusion. Normal orbits.   IMPRESSION: Normal brain MRI.  PATIENT SURVEYS:  FOTO 53/100 ,  April 08, 2022 has been up to 74 and today was 16 but has been doing a lot and lifting and is back doing much more than she was.  January 4,2024 57/100 she does report tha tshe wnet backwards from her previous due to her being able to do so much more but is hurting more   COGNITION: Overall cognitive status: Within functional limits for tasks assessed  SENSATION: WFL  POSTURE: rounded shoulders, forward head, and increased thoracic kyphosis  PALPATION: TTP and sore along upper trap and cervical paraspinals, trigger points in R and L UT   CERVICAL ROM:   Active ROM A/PROM (deg) eval 12/30/21 01/05/22 9/28 10/31 04/05/22  Flexion 100% no pain WFL WFL WNL   WNL  Extension Mild limitations w/pain [facility]  WFL WNL   WNL  Right lateral flexion Mod limitation w/pain Mild limitations Mild limitations Severe limitation, leans with trunk  Decreased 75%  Decreased 50%  Left lateral flexion Mod limitation w/pain Mod limitations Mild limitations Severe limitation, leans with trunk  Decreased 75% Decreased  50%  Right rotation 75% w/pain Mild limitation w/pain WFL pain at end range  Mild limitation  Decreased 25%  Decreased 25%  Left rotation 75% w/pain  Mild limitation w/pain  WFL pain at end range  Mild limitation  Decreased 25% Decreased  25%   (Blank rows = not tested)  UPPER EXTREMITY ROM: WFL, limited in IR and ER due to prior RC surgery years ago   UPPER EXTREMITY MMT:  MMT Right eval Left eval Right 01/05/22 Left 01/05/22 R 9/28 L 9/28  Shoulder flexion 4+ 4+ 5 5 5 5   Shoulder extension        Shoulder abduction 4+ very painful 4+ 4+ with pain 5 5 5    Shoulder adduction        Shoulder extension        Shoulder internal rotation 4+ 4+ 5 5 5 5   Shoulder external rotation     5 5  Middle trapezius        Lower trapezius        Elbow flexion 5 5      Elbow extension 5 5      Wrist flexion  Wrist extension        Wrist ulnar deviation        Wrist radial deviation        Wrist pronation        Wrist supination        Grip strength         (Blank rows = not tested) 05/05/22 LE MMT  right 4+/5, left 4/5  SLR 40 degrees with pain in the legs and the back  Cannot cross her legs due to tight piriformis 25 degrees hip ER  Abdominal strength 2/5 unable to sit up or hold at 40 degrees  5x STS 21 seconds 06/14/22 TUG 10 seconds  5XSTS 16 seconds  Right 4+/5, left 4+/5  Manually I can cross her legs fully, she reports able to get   Socks on now, she can on her own without hands get her  Heels to mid patella  SLR to 55 degrees  Can hold plank 10 seconds elbows and toes  Had her A1C go down from 8.2 to 7.8   07/14/22  can hold plank 20 seconds x4  TUG 7 seconds  08/16/22  still very difficult to put socks on, is getting closer to being able to cross leg over.  Heels to superior patella. Weight today was 202#.  5XSTS 11 seconds.  Planks 30 seconds x3 and 20s x 1  09/20/22  A1C = 7.1, weight 198#, SLR 65 degrees, Can get both heels just above the patella, planks 35 seconds x 2   TODAY'S TREATMENT:  10/06/22 Weight 197# UBE level 5 x 6 minutes Bike level 5 x 5 minutes AR press 15# 5# hip extension and abduction Calf stretch HS and piriformis stretches Crunches Feet on ball K2C, rotation, bridge, isometric abs    10/04/22 Elliptical 2 minutes level 3 Nustep level 4 x 5 minutes Standing on airex 10# rows and straight arm pulls Seated row 20# 2x15 Leg press 30# 2x15 Passive stretch to calves, HS and piriformis She was very tight and had difficulty, we gave handout and put in her phone that she really needed, really reinforced  this  09/22/22 2 laps brisk pace but had to slow down on the second lap 25# leg curls 3x10 10# leg extension 2x15 60# leg press, 20# single leg with piriformis stretch STM to the upper traps, rhomboids and cervical area Passive stretch of the pectorals, piriformis and the HS  09/20/22 Weight 198# 2 laps brisk pace no rest Bike level 4 x 6 minutes with power bursts up to 500 watts x 3 20# low squat 3x5 20# axe chop Planks 35seconds x 2 Power ball slams Passive stretch HS and piriformis  09/14/22 Weight 199# Elliptical level 4 x 4 minutes 20# sit to stand from various heights lowest 15" Leg press 30# 2x10 single legs, 70# both legs On bosu ball balance and squats 2 ways Volleyball Planks 30 sec x 2 Passive stretch HS, piriformis and quads 20# axe chops  09/13/22 UBE level 4.5 x 6 minutes HS curls 35# 2x10 Leg extension10# 3x10 20# farmer carry 1 lap each hand 5# overhead carry both arms together On bosu 3# biceps, alternating press and lateral raise Seated row 25# 2x15 25# lats 2x15 5# hip abduction, extension and flexion volleyball  09/08/22 UBE level 4 x 6 minutes 2 laps brisk walk Pushing tmill 3x30seconds 25# rows 25# lats 10# trunk rotation from low to high 20# trunk chops PAssive LE stretches  09/06/22 UBE level 4  x 6 minutes Gait 2 laps brisk pace no rest 60# leg press 3x10 35# leg curls 2x10 10# leg extension 2x10 50# resisted gait all directions Bike level 4 x 6 minutes with 3 power bursts 500watts, 400 watts, 455 watts On bosu balance and reaching and then squats 20# trunk chops 2x10  09/01/22 Bike level 4 with 3 power bursts x 6 minutes up to 350 watts Leg press 60#  On airex 15# straight arm pulls 20# AR press 60# leg press 3 x10 10# hip extension and abduction and flexion On bosu 3# biceps and overhead press Wobble board balance Planks 30s x 2  08/29/22 2 laps brisk pace Leg extension 15# 2x15 LEg curls 35# 3x10 15# straight arm  pulls 20# AR press 10# hip extension and hip abduction 10# and 20# sit to stand 25# rows 25# lats Tmill pushes 3x30s Passive HS and piriformis stretch  08/25/22 Gait 2 laps with 3# biceps and punches Seated row 25# Lats 25# 60# leg press 2x10 Worked on getting up from the floor able to do pretty well Planks 2x 30s Elliptical 4 minutes 10# hip extension, abduction and flexion WEight 200.5#  08/23/22 Gait 2 laps around the back building 15# straight arm pulls 25# lats and Rows Tmill push two ways Nustep level 6 x 5 minutes shooting for 350 steps On bosu 3# biceps, punches and abduction 10# hip extension and abduction  08/18/22 UBE level 4 x 6 minutes Rows 25# Lats 25# Black tband extension HS curls 25# 2x15 Leg extension 10# 2x15 On bosu 4# biceps, punches, ball toss and weighted ball  STM to the right upper trap and rhomboid  08/16/22 Walking 2 laps brisk pace Balance on bosu 2 ways, balance, head turns and reaching  Airex plank side stepping and tandem gait LEg press 40# 2x15 PROM of the LE's Assessment as noted above  08/11/22 Weight 201# Elliptical level 4 x 3 minutes Leg curls 35# 2x15 Leg extension 15# 2x15 On airex 15# 2x12 Planks 4 x 20s cues to breath Feet on ball bridges 2x10 HS and piriformis stretch Partial eccentric sit ups 20# trunk axe chop Upper trap and levator stretches   08/09/22 Weight 202# 2 laps outside good pace one rest 15# straight arm pulls 20# AR press 10# hip extension abd, flexion 2x15 25# seated rows 25# lats 10# chest press 40# leg press Passive stretch to the LE's  07/21/22 Fast walk around the building no rest 10# hip extension, abduction and flexion Sit to stands 10# overhead press on sit fit  10# straight arm pulls 15# AR press 25# rows Black tband extension and abs HS, calf and piriformis stretch  07/19/22 Weight 203# Gait around the back building brisk pace, one short rest 60# leg press 2x15 35# straight  arm pulls  35# triceps 5# hips 3 ways Tmill push and pulls 20s x 3 each 3# overhead stick carry Passive HS and piriformis stretch  07/14/22 Weight 204# Fast walk around the back building 10# Hip extension and abduction 15# AR press 15# straight arm pulls Leg press 60# 2x15 Planks 4x20 seconds On bosu balance 3# alternating work  07/12/22 Weight 207# Brisk walk around the back building without rest and able to carry on a conversation 35# leg curls 10# leg extension Passive stretch LE's Education in calories in and out, carbs, protein and fiber 25# rows 2x15 Lats 25# 2x15 Elliptical 3 minutes level 4   PATIENT EDUCATION:  Education details: POC Person educated: Patient  Education method: Explanation Education comprehension: verbalized understanding   HOME EXERCISE PROGRAM: Access Code: [email or web address] URL: [email or web address] Date: 12/01/2021 Prepared by: Cassie Freer   ASSESSMENT:  CLINICAL IMPRESSION: Patient doing well, feeling better, she had better flexibility today, we did do a lot of discussion on protein, carbs, choices when eating out, as she is always going and has to make choices a lot.    REHAB POTENTIAL: Good  CLINICAL DECISION MAKING: Stable/uncomplicated  EVALUATION COMPLEXITY: Low   GOALS: Goals reviewed with patient? Yes  SHORT TERM GOALS: Target date: 12/29/21  Patient will be independent with initial HEP.  Goal status: MET  LONG TERM GOALS: Target date: 02/26/22  Patient will be independent with advanced/ongoing HEP to improve outcomes and carryover.  Goal status: continuing 09/20/22  2.  Patient will report 75% improvement in neck pain to improve QOL.   Baseline: 9/28- not even 50% improvement maybe 40% Goal status: met  3.  Patient will demonstrate full pain free cervical ROM to complete ADLs and play with grandchildren with more ease. Goal status: met 4.  Patient will demonstrate improved posture to decrease muscle  imbalance and tightness.  Baseline: increased tightness along cervical paraspinals Goal status: progressing still tight piriformis 08/16/22  5.  Patient will report 52 on FOTO to demonstrate improved functional ability.  Baseline:  9/28- 64  Goal status: MET but has regressed as she is doing more and more  6.  Will demonstrate improved core strength as evidenced by passing double leg lower test  Baseline:  Goal status: progressing 08/16/22 7.  Average 11,000 steps a day on her fit bit over a month period.  Met 08/25/22   PLAN: PT FREQUENCY: 2x/week  PT DURATION: 4 weeks  PLANNED INTERVENTIONS: Therapeutic exercises, Therapeutic activity, Neuromuscular re-education, Balance training, Gait training, Patient/Family education, Self Care, Joint mobilization, Dry Needling, Spinal manipulation, Spinal mobilization, Cryotherapy, Moist heat, Taping, Vasopneumatic device, Traction, Ionotophoresis 4mg /ml Dexamethasone, and Manual therapy  PLAN FOR NEXT SESSION:  work on her doing independent gym if possibel and safe Stacie Glaze, PT 10/06/2022, 2:03 PM   [facility] [address] [facility]. [place], [place], [address] Phone: [phone]   Fax:  [phone]

## 2022-10-11 ENCOUNTER — Ambulatory Visit: Payer: Medicare Other | Admitting: Physical Therapy

## 2022-10-11 ENCOUNTER — Encounter: Payer: Self-pay | Admitting: Physical Therapy

## 2022-10-11 DIAGNOSIS — R519 Headache, unspecified: Secondary | ICD-10-CM

## 2022-10-11 DIAGNOSIS — M6281 Muscle weakness (generalized): Secondary | ICD-10-CM | POA: Diagnosis not present

## 2022-10-11 DIAGNOSIS — G8929 Other chronic pain: Secondary | ICD-10-CM | POA: Diagnosis not present

## 2022-10-11 DIAGNOSIS — M542 Cervicalgia: Secondary | ICD-10-CM

## 2022-10-11 NOTE — Therapy (Signed)
OUTPATIENT PHYSICAL THERAPY CERVICAL TREATMENT    Patient Name: Veronica Galvan MRN: [id number] DOB:10/29/1955, 67 y.o., female Today's Date: 10/11/2022  PT End of Session - 10/11/22 1501     Visit Number 80    Date for PT Re-Evaluation 10/21/22    PT Start Time 1444    PT Stop Time 1530    PT Time Calculation (min) 46 min    Activity Tolerance Patient tolerated treatment well    Behavior During Therapy WFL for tasks assessed/performed               Past Medical History:  Diagnosis Date   Allergy    Anxiety    Arthritis    hands   Cataract    Diabetes mellitus without complication (HCC)    type 2   Family history of adverse reaction to anesthesia    son has malignant hyperthermia, daughter does not daughter recently had c section without problems   GERD (gastroesophageal reflux disease)    Headache    sinus   Hyperlipidemia    Hypertension    PONV (postoperative nausea and vomiting)    nausea only   Ulcer, stomach peptic yrs ago   Past Surgical History:  Procedure Laterality Date   APPENDECTOMY     both hells bone spur repair     both heels with metal clips   both shoulder rotator cuff repair     CESAREAN SECTION     x 1   COLONOSCOPY WITH PROPOFOL N/A 09/07/2016   Procedure: COLONOSCOPY WITH PROPOFOL;  Surgeon: Charolett Bumpers, MD;  Location: [facility];  Service: Endoscopy;  Laterality: N/A;   colonscopy  06/2011   polyps   EYE SURGERY     FRACTURE SURGERY     TUBAL LIGATION     VESICO-VAGINAL FISTULA REPAIR     Patient Active Problem List   Diagnosis Date Noted   Gastroesophageal reflux disease 11/16/2021   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 08/18/2021   Dermatofibroma 08/18/2021   History of malignant neoplasm of skin 08/18/2021   Lentigo 08/18/2021   Melanocytic nevi of trunk 08/18/2021   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   Rosacea 08/18/2021   Actinic keratosis 08/18/2021   Sensorineural  hearing loss (SNHL) of left ear with unrestricted hearing of right ear 07/26/2021   Tinnitus of left ear 07/26/2021   Acute recurrent maxillary sinusitis 06/22/2021   Primary hypertension 01/12/2021   Hyperlipidemia associated with type 2 diabetes mellitus (HCC) 01/12/2021   Arthritis 01/12/2021   Type II diabetes mellitus (HCC) 11/25/2019   Ingrown toenail 09/05/2018   Neck pain 01/29/2018   Tick bite 10/24/2017    PCP: Ria Clock  REFERRING PROVIDER: Ocie Doyne  REFERRING DIAG: M54.2  THERAPY DIAG:  Cervicalgia  Chronic intractable headache, unspecified headache type  Muscle weakness (generalized)  Rationale for Evaluation and Treatment Rehabilitation  ONSET DATE: 11/08/21  SUBJECTIVE:  SUBJECTIVE STATEMENT: Patient reports that she is having some soreness in the hips and is still struggling with not being flexible  in the LE's PERTINENT HISTORY:  Arthritis, HTN, bilateral rotator cuff surgery 15-67yrs ago, DM  PAIN:  Are you having pain? Yes: NPRS scale: 5/10 Pain location: head and neck, shoulders  Pain description: tight  Aggravating factors: moving Relieving factors: massage, standing in the shower  WEIGHT BEARING RESTRICTIONS No  FALLS:  Has patient fallen in last 6 months? No  LIVING ENVIRONMENT: Lives with: lives alone Lives in: House/apartment Stairs: Yes: External: 3 steps; can reach both Has following equipment at home: None  OCCUPATION: reitred  PLOF: Independent  PATIENT GOALS to loosen it up, find out what is causing my headaches   OBJECTIVE:   DIAGNOSTIC FINDINGS:   FINDINGS: Brain: No acute infarct, mass effect or extra-axial collection. No acute or chronic hemorrhage. Normal white matter signal, parenchymal volume and CSF spaces. The  midline structures are normal.  Skull and upper cervical spine: Normal calvarium and skull base. Visualized upper cervical spine and soft tissues are normal.   Sinuses/Orbits:No paranasal sinus fluid levels or advanced mucosal thickening. No mastoid or middle ear effusion. Normal orbits.   IMPRESSION: Normal brain MRI.  PATIENT SURVEYS:  FOTO 53/100 ,  April 08, 2022 has been up to 36 and today was 37 but has been doing a lot and lifting and is back doing much more than she was.  January 4,2024 57/100 she does report tha tshe wnet backwards from her previous due to her being able to do so much more but is hurting more   COGNITION: Overall cognitive status: Within functional limits for tasks assessed  SENSATION: WFL  POSTURE: rounded shoulders, forward head, and increased thoracic kyphosis  PALPATION: TTP and sore along upper trap and cervical paraspinals, trigger points in R and L UT   CERVICAL ROM:   Active ROM A/PROM (deg) eval 12/30/21 01/05/22 9/28 10/31 04/05/22  Flexion 100% no pain WFL WFL WNL   WNL  Extension Mild limitations w/pain [facility]  WFL WNL   WNL  Right lateral flexion Mod limitation w/pain Mild limitations Mild limitations Severe limitation, leans with trunk  Decreased 75%  Decreased 50%  Left lateral flexion Mod limitation w/pain Mod limitations Mild limitations Severe limitation, leans with trunk  Decreased 75% Decreased  50%  Right rotation 75% w/pain Mild limitation w/pain WFL pain at end range  Mild limitation  Decreased 25%  Decreased 25%  Left rotation 75% w/pain  Mild limitation w/pain  WFL pain at end range  Mild limitation  Decreased 25% Decreased  25%   (Blank rows = not tested)  UPPER EXTREMITY ROM: WFL, limited in IR and ER due to prior RC surgery years ago   UPPER EXTREMITY MMT:  MMT Right eval Left eval Right 01/05/22 Left 01/05/22 R 9/28 L 9/28  Shoulder flexion 4+ 4+ 5 5 5 5   Shoulder extension        Shoulder abduction 4+ very painful 4+  4+ with pain 5 5 5   Shoulder adduction        Shoulder extension        Shoulder internal rotation 4+ 4+ 5 5 5 5   Shoulder external rotation     5 5  Middle trapezius        Lower trapezius        Elbow flexion 5 5      Elbow extension 5 5  Wrist flexion        Wrist extension        Wrist ulnar deviation        Wrist radial deviation        Wrist pronation        Wrist supination        Grip strength         (Blank rows = not tested) 05/05/22 LE MMT  right 4+/5, left 4/5  SLR 40 degrees with pain in the legs and the back  Cannot cross her legs due to tight piriformis 25 degrees hip ER  Abdominal strength 2/5 unable to sit up or hold at 40 degrees  5x STS 21 seconds 06/14/22 TUG 10 seconds  5XSTS 16 seconds  Right 4+/5, left 4+/5  Manually I can cross her legs fully, she reports able to get   Socks on now, she can on her own without hands get her  Heels to mid patella  SLR to 55 degrees  Can hold plank 10 seconds elbows and toes  Had her A1C go down from 8.2 to 7.8   07/14/22  can hold plank 20 seconds x4  TUG 7 seconds  08/16/22  still very difficult to put socks on, is getting closer to being able to cross leg over.  Heels to superior patella. Weight today was 202#.  5XSTS 11 seconds.  Planks 30 seconds x3 and 20s x 1  09/20/22  A1C = 7.1, weight 198#, SLR 65 degrees, Can get both heels just above the patella, planks 35 seconds x 2   TODAY'S TREATMENT:  10/11/22 Nustep level 6 x 5 minutes Gait outside fast pace two laps 10# hip extension and abduction Leg curls 25# Leg extension 10# Leg press 40# Plank 60 seconds Clamshells green tband 25# lat pulls  10/06/22 Weight 197# UBE level 5 x 6 minutes Bike level 5 x 5 minutes AR press 15# 5# hip extension and abduction Calf stretch HS and piriformis stretches Crunches Feet on ball K2C, rotation, bridge, isometric abs    10/04/22 Elliptical 2 minutes level 3 Nustep level 4 x 5 minutes Standing on airex 10# rows and  straight arm pulls Seated row 20# 2x15 Leg press 30# 2x15 Passive stretch to calves, HS and piriformis She was very tight and had difficulty, we gave handout and put in her phone that she really needed, really reinforced this  09/22/22 2 laps brisk pace but had to slow down on the second lap 25# leg curls 3x10 10# leg extension 2x15 60# leg press, 20# single leg with piriformis stretch STM to the upper traps, rhomboids and cervical area Passive stretch of the pectorals, piriformis and the HS  09/20/22 Weight 198# 2 laps brisk pace no rest Bike level 4 x 6 minutes with power bursts up to 500 watts x 3 20# low squat 3x5 20# axe chop Planks 35seconds x 2 Power ball slams Passive stretch HS and piriformis  09/14/22 Weight 199# Elliptical level 4 x 4 minutes 20# sit to stand from various heights lowest 15" Leg press 30# 2x10 single legs, 70# both legs On bosu ball balance and squats 2 ways Volleyball Planks 30 sec x 2 Passive stretch HS, piriformis and quads 20# axe chops  09/13/22 UBE level 4.5 x 6 minutes HS curls 35# 2x10 Leg extension10# 3x10 20# farmer carry 1 lap each hand 5# overhead carry both arms together On bosu 3# biceps, alternating press and lateral raise Seated row 25# 2x15 25#  lats 2x15 5# hip abduction, extension and flexion volleyball  09/08/22 UBE level 4 x 6 minutes 2 laps brisk walk Pushing tmill 3x30seconds 25# rows 25# lats 10# trunk rotation from low to high 20# trunk chops PAssive LE stretches  09/06/22 UBE level 4 x 6 minutes Gait 2 laps brisk pace no rest 60# leg press 3x10 35# leg curls 2x10 10# leg extension 2x10 50# resisted gait all directions Bike level 4 x 6 minutes with 3 power bursts 500watts, 400 watts, 455 watts On bosu balance and reaching and then squats 20# trunk chops 2x10  09/01/22 Bike level 4 with 3 power bursts x 6 minutes up to 350 watts Leg press 60#  On airex 15# straight arm pulls 20# AR press 60# leg press 3  x10 10# hip extension and abduction and flexion On bosu 3# biceps and overhead press Wobble board balance Planks 30s x 2    PATIENT EDUCATION:  Education details: POC Person educated: Patient Education method: Explanation Education comprehension: verbalized understanding   HOME EXERCISE PROGRAM: Access Code: [email or web address] URL: [email or web address] Date: 12/01/2021 Prepared by: Cassie Freer   ASSESSMENT:  CLINICAL IMPRESSION: Patient still very stiff and difficulty with some of her motions.  She continues to improve was able to hold the plank 60 seconds.  We are doing a lot of discussion on the program after PT, food choices and I am really trying to get her to walk on her own    REHAB POTENTIAL: Good  CLINICAL DECISION MAKING: Stable/uncomplicated  EVALUATION COMPLEXITY: Low   GOALS: Goals reviewed with patient? Yes  SHORT TERM GOALS: Target date: 12/29/21  Patient will be independent with initial HEP.  Goal status: MET  LONG TERM GOALS: Target date: 02/26/22  Patient will be independent with advanced/ongoing HEP to improve outcomes and carryover.  Goal status: continuing 09/20/22  2.  Patient will report 75% improvement in neck pain to improve QOL.   Baseline: 9/28- not even 50% improvement maybe 40% Goal status: met  3.  Patient will demonstrate full pain free cervical ROM to complete ADLs and play with grandchildren with more ease. Goal status: met 4.  Patient will demonstrate improved posture to decrease muscle imbalance and tightness.  Baseline: increased tightness along cervical paraspinals Goal status: progressing still tight piriformis 08/16/22  5.  Patient will report 48 on FOTO to demonstrate improved functional ability.  Baseline:  9/28- 64  Goal status: MET but has regressed as she is doing more and more  6.  Will demonstrate improved core strength as evidenced by passing double leg lower test  Baseline:  Goal status: progressing  08/16/22 7.  Average 11,000 steps a day on her fit bit over a month period.  Met 08/25/22   PLAN: PT FREQUENCY: 2x/week  PT DURATION: 4 weeks  PLANNED INTERVENTIONS: Therapeutic exercises, Therapeutic activity, Neuromuscular re-education, Balance training, Gait training, Patient/Family education, Self Care, Joint mobilization, Dry Needling, Spinal manipulation, Spinal mobilization, Cryotherapy, Moist heat, Taping, Vasopneumatic device, Traction, Ionotophoresis 4mg /ml Dexamethasone, and Manual therapy  PLAN FOR NEXT SESSION:  work on her doing independent gym if possibel and safe Stacie Glaze, PT 10/11/2022, 3:02 PM   [facility] [address] [facility] [place], [place], [address] Phone: [phone]   Fax:  [phone]

## 2022-10-12 ENCOUNTER — Ambulatory Visit: Payer: Medicare Other | Admitting: Physical Therapy

## 2022-10-12 ENCOUNTER — Encounter: Payer: Self-pay | Admitting: Physical Therapy

## 2022-10-12 DIAGNOSIS — G8929 Other chronic pain: Secondary | ICD-10-CM | POA: Diagnosis not present

## 2022-10-12 DIAGNOSIS — M542 Cervicalgia: Secondary | ICD-10-CM

## 2022-10-12 DIAGNOSIS — M6281 Muscle weakness (generalized): Secondary | ICD-10-CM

## 2022-10-12 DIAGNOSIS — R519 Headache, unspecified: Secondary | ICD-10-CM | POA: Diagnosis not present

## 2022-10-12 NOTE — Therapy (Signed)
OUTPATIENT PHYSICAL THERAPY CERVICAL TREATMENT    Patient Name: Veronica Galvan MRN: [id number] DOB:1956-01-06, 67 y.o., female Today's Date: 10/12/2022  PT End of Session - 10/12/22 1507     Visit Number 81    Date for PT Re-Evaluation 10/21/22    PT Start Time 1443    PT Stop Time 1528    PT Time Calculation (min) 45 min    Activity Tolerance Patient tolerated treatment well    Behavior During Therapy WFL for tasks assessed/performed               Past Medical History:  Diagnosis Date   Allergy    Anxiety    Arthritis    hands   Cataract    Diabetes mellitus without complication (HCC)    type 2   Family history of adverse reaction to anesthesia    son has malignant hyperthermia, daughter does not daughter recently had c section without problems   GERD (gastroesophageal reflux disease)    Headache    sinus   Hyperlipidemia    Hypertension    PONV (postoperative nausea and vomiting)    nausea only   Ulcer, stomach peptic yrs ago   Past Surgical History:  Procedure Laterality Date   APPENDECTOMY     both hells bone spur repair     both heels with metal clips   both shoulder rotator cuff repair     CESAREAN SECTION     x 1   COLONOSCOPY WITH PROPOFOL N/A 09/07/2016   Procedure: COLONOSCOPY WITH PROPOFOL;  Surgeon: Charolett Bumpers, MD;  Location: [facility];  Service: Endoscopy;  Laterality: N/A;   colonscopy  06/2011   polyps   EYE SURGERY     FRACTURE SURGERY     TUBAL LIGATION     VESICO-VAGINAL FISTULA REPAIR     Patient Active Problem List   Diagnosis Date Noted   Gastroesophageal reflux disease 11/16/2021   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 08/18/2021   Dermatofibroma 08/18/2021   History of malignant neoplasm of skin 08/18/2021   Lentigo 08/18/2021   Melanocytic nevi of trunk 08/18/2021   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   Rosacea 08/18/2021   Actinic keratosis 08/18/2021   Sensorineural  hearing loss (SNHL) of left ear with unrestricted hearing of right ear 07/26/2021   Tinnitus of left ear 07/26/2021   Acute recurrent maxillary sinusitis 06/22/2021   Primary hypertension 01/12/2021   Hyperlipidemia associated with type 2 diabetes mellitus (HCC) 01/12/2021   Arthritis 01/12/2021   Type II diabetes mellitus (HCC) 11/25/2019   Ingrown toenail 09/05/2018   Neck pain 01/29/2018   Tick bite 10/24/2017    PCP: Ria Clock  REFERRING PROVIDER: Ocie Doyne  REFERRING DIAG: M54.2  THERAPY DIAG:  Cervicalgia  Chronic intractable headache, unspecified headache type  Muscle weakness (generalized)  Rationale for Evaluation and Treatment Rehabilitation  ONSET DATE: 11/08/21  SUBJECTIVE:  SUBJECTIVE STATEMENT: Patient reports that she is doing okay today, a little sore in the neck and upper trap, trying to go to a different class, had questions about the activity we talked about PERTINENT HISTORY:  Arthritis, HTN, bilateral rotator cuff surgery 15-45yrs ago, DM  PAIN:  Are you having pain? Yes: NPRS scale: 5/10 Pain location: head and neck, shoulders  Pain description: tight  Aggravating factors: moving Relieving factors: massage, standing in the shower  WEIGHT BEARING RESTRICTIONS No  FALLS:  Has patient fallen in last 6 months? No  LIVING ENVIRONMENT: Lives with: lives alone Lives in: House/apartment Stairs: Yes: External: 3 steps; can reach both Has following equipment at home: None  OCCUPATION: reitred  PLOF: Independent  PATIENT GOALS to loosen it up, find out what is causing my headaches   OBJECTIVE:   DIAGNOSTIC FINDINGS:   FINDINGS: Brain: No acute infarct, mass effect or extra-axial collection. No acute or chronic hemorrhage. Normal white  matter signal, parenchymal volume and CSF spaces. The midline structures are normal.  Skull and upper cervical spine: Normal calvarium and skull base. Visualized upper cervical spine and soft tissues are normal.   Sinuses/Orbits:No paranasal sinus fluid levels or advanced mucosal thickening. No mastoid or middle ear effusion. Normal orbits.   IMPRESSION: Normal brain MRI.  PATIENT SURVEYS:  FOTO 53/100 ,  April 08, 2022 has been up to 36 and today was 42 but has been doing a lot and lifting and is back doing much more than she was.  January 4,2024 57/100 she does report tha tshe wnet backwards from her previous due to her being able to do so much more but is hurting more   COGNITION: Overall cognitive status: Within functional limits for tasks assessed  SENSATION: WFL  POSTURE: rounded shoulders, forward head, and increased thoracic kyphosis  PALPATION: TTP and sore along upper trap and cervical paraspinals, trigger points in R and L UT   CERVICAL ROM:   Active ROM A/PROM (deg) eval 12/30/21 01/05/22 9/28 10/31 04/05/22  Flexion 100% no pain WFL WFL WNL   WNL  Extension Mild limitations w/pain [facility]  WFL WNL   WNL  Right lateral flexion Mod limitation w/pain Mild limitations Mild limitations Severe limitation, leans with trunk  Decreased 75%  Decreased 50%  Left lateral flexion Mod limitation w/pain Mod limitations Mild limitations Severe limitation, leans with trunk  Decreased 75% Decreased  50%  Right rotation 75% w/pain Mild limitation w/pain WFL pain at end range  Mild limitation  Decreased 25%  Decreased 25%  Left rotation 75% w/pain  Mild limitation w/pain  WFL pain at end range  Mild limitation  Decreased 25% Decreased  25%   (Blank rows = not tested)  UPPER EXTREMITY ROM: WFL, limited in IR and ER due to prior RC surgery years ago   UPPER EXTREMITY MMT:  MMT Right eval Left eval Right 01/05/22 Left 01/05/22 R 9/28 L 9/28  Shoulder flexion 4+ 4+ 5 5 5 5   Shoulder  extension        Shoulder abduction 4+ very painful 4+ 4+ with pain 5 5 5   Shoulder adduction        Shoulder extension        Shoulder internal rotation 4+ 4+ 5 5 5 5   Shoulder external rotation     5 5  Middle trapezius        Lower trapezius        Elbow flexion 5 5  Elbow extension 5 5      Wrist flexion        Wrist extension        Wrist ulnar deviation        Wrist radial deviation        Wrist pronation        Wrist supination        Grip strength         (Blank rows = not tested) 05/05/22 LE MMT  right 4+/5, left 4/5  SLR 40 degrees with pain in the legs and the back  Cannot cross her legs due to tight piriformis 25 degrees hip ER  Abdominal strength 2/5 unable to sit up or hold at 40 degrees  5x STS 21 seconds 06/14/22 TUG 10 seconds  5XSTS 16 seconds  Right 4+/5, left 4+/5  Manually I can cross her legs fully, she reports able to get   Socks on now, she can on her own without hands get her  Heels to mid patella  SLR to 55 degrees  Can hold plank 10 seconds elbows and toes  Had her A1C go down from 8.2 to 7.8   07/14/22  can hold plank 20 seconds x4  TUG 7 seconds  08/16/22  still very difficult to put socks on, is getting closer to being able to cross leg over.  Heels to superior patella. Weight today was 202#.  5XSTS 11 seconds.  Planks 30 seconds x3 and 20s x 1  09/20/22  A1C = 7.1, weight 198#, SLR 65 degrees, Can get both heels just above the patella, planks 35 seconds x 2   TODAY'S TREATMENT:  10/12/22 STM to the right upper trap and rhomboid Passive stretch to the LE's LEg extension 15# Leg curls 35# 25# rows 25# lats 10# hip extension and abduction 20# AR press Plank 60 seconds  10/11/22 Nustep level 6 x 5 minutes Gait outside fast pace two laps 10# hip extension and abduction Leg curls 25# Leg extension 10# Leg press 40# Plank 60 seconds Clamshells green tband 25# lat pulls  10/06/22 Weight 197# UBE level 5 x 6 minutes Bike level 5 x 5  minutes AR press 15# 5# hip extension and abduction Calf stretch HS and piriformis stretches Crunches Feet on ball K2C, rotation, bridge, isometric abs    10/04/22 Elliptical 2 minutes level 3 Nustep level 4 x 5 minutes Standing on airex 10# rows and straight arm pulls Seated row 20# 2x15 Leg press 30# 2x15 Passive stretch to calves, HS and piriformis She was very tight and had difficulty, we gave handout and put in her phone that she really needed, really reinforced this  09/22/22 2 laps brisk pace but had to slow down on the second lap 25# leg curls 3x10 10# leg extension 2x15 60# leg press, 20# single leg with piriformis stretch STM to the upper traps, rhomboids and cervical area Passive stretch of the pectorals, piriformis and the HS  09/20/22 Weight 198# 2 laps brisk pace no rest Bike level 4 x 6 minutes with power bursts up to 500 watts x 3 20# low squat 3x5 20# axe chop Planks 35seconds x 2 Power ball slams Passive stretch HS and piriformis  09/14/22 Weight 199# Elliptical level 4 x 4 minutes 20# sit to stand from various heights lowest 15" Leg press 30# 2x10 single legs, 70# both legs On bosu ball balance and squats 2 ways Volleyball Planks 30 sec x 2 Passive stretch HS, piriformis and quads  20# axe chops  09/13/22 UBE level 4.5 x 6 minutes HS curls 35# 2x10 Leg extension10# 3x10 20# farmer carry 1 lap each hand 5# overhead carry both arms together On bosu 3# biceps, alternating press and lateral raise Seated row 25# 2x15 25# lats 2x15 5# hip abduction, extension and flexion volleyball  09/08/22 UBE level 4 x 6 minutes 2 laps brisk walk Pushing tmill 3x30seconds 25# rows 25# lats 10# trunk rotation from low to high 20# trunk chops PAssive LE stretches  09/06/22 UBE level 4 x 6 minutes Gait 2 laps brisk pace no rest 60# leg press 3x10 35# leg curls 2x10 10# leg extension 2x10 50# resisted gait all directions Bike level 4 x 6 minutes with 3  power bursts 500watts, 400 watts, 455 watts On bosu balance and reaching and then squats 20# trunk chops 2x10  09/01/22 Bike level 4 with 3 power bursts x 6 minutes up to 350 watts Leg press 60#  On airex 15# straight arm pulls 20# AR press 60# leg press 3 x10 10# hip extension and abduction and flexion On bosu 3# biceps and overhead press Wobble board balance Planks 30s x 2    PATIENT EDUCATION:  Education details: POC Person educated: Patient Education method: Explanation Education comprehension: verbalized understanding   HOME EXERCISE PROGRAM: Access Code: [email or web address] URL: [email or web address] Date: 12/01/2021 Prepared by: Cassie Freer   ASSESSMENT:  CLINICAL IMPRESSION: REally working on getting her independent with gym and activities to get stretching at home.  She is doing well but still struggles with flexibility, I know she is thinking more about the exercises and the calories in and out  REHAB POTENTIAL: Good  CLINICAL DECISION MAKING: Stable/uncomplicated  EVALUATION COMPLEXITY: Low   GOALS: Goals reviewed with patient? Yes  SHORT TERM GOALS: Target date: 12/29/21  Patient will be independent with initial HEP.  Goal status: MET  LONG TERM GOALS: Target date: 02/26/22  Patient will be independent with advanced/ongoing HEP to improve outcomes and carryover.  Goal status: continuing 09/20/22  2.  Patient will report 75% improvement in neck pain to improve QOL.   Baseline: 9/28- not even 50% improvement maybe 40% Goal status: met  3.  Patient will demonstrate full pain free cervical ROM to complete ADLs and play with grandchildren with more ease. Goal status: met 4.  Patient will demonstrate improved posture to decrease muscle imbalance and tightness.  Baseline: increased tightness along cervical paraspinals Goal status: progressing still tight piriformis 08/16/22  5.  Patient will report 18 on FOTO to demonstrate improved functional  ability.  Baseline:  9/28- 64  Goal status: MET but has regressed as she is doing more and more  6.  Will demonstrate improved core strength as evidenced by passing double leg lower test  Baseline:  Goal status: progressing 08/16/22 7.  Average 11,000 steps a day on her fit bit over a month period.  Met 08/25/22   PLAN: PT FREQUENCY: 2x/week  PT DURATION: 4 weeks  PLANNED INTERVENTIONS: Therapeutic exercises, Therapeutic activity, Neuromuscular re-education, Balance training, Gait training, Patient/Family education, Self Care, Joint mobilization, Dry Needling, Spinal manipulation, Spinal mobilization, Cryotherapy, Moist heat, Taping, Vasopneumatic device, Traction, Ionotophoresis 4mg /ml Dexamethasone, and Manual therapy  PLAN FOR NEXT SESSION:  work on her doing independent gym if possibel and safe Stacie Glaze, PT 10/12/2022, 3:08 PM   [facility] [address] [facility] [place], [place], [address] Phone: [phone]   Fax:  [phone]

## 2022-10-13 ENCOUNTER — Ambulatory Visit: Payer: Medicare Other | Admitting: Physical Therapy

## 2022-10-17 ENCOUNTER — Ambulatory Visit: Payer: Medicare Other | Admitting: Podiatry

## 2022-10-17 DIAGNOSIS — E119 Type 2 diabetes mellitus without complications: Secondary | ICD-10-CM

## 2022-10-17 DIAGNOSIS — M7989 Other specified soft tissue disorders: Secondary | ICD-10-CM

## 2022-10-17 NOTE — Progress Notes (Signed)
Subjective: Chief Complaint  Patient presents with   Diabetes    Yearly diabetes exam    67 year old female presents the office today with the above concerns.  She presents today for diabetic foot exam.  She also has concerns for small cyst, area on the top of her right foot which is been ongoing for some time which were itchy occasionally.  She works now with assist.  No injuries that she reports.  No open lesions.  No other concerns.   Objective: AAO x3, NAD DP/PT pulses palpable bilaterally, CRT less than 3 seconds Sensation intact with Semmes Weinstein monofilament On the dorsal aspect of the right midfoot is a fluid-filled mobile soft tissue mass with overlying localized erythema present.  There is no fluctuation, crepitation, malodor. No open lesions otherwise. No pain with calf compression, swelling, warmth, erythema  Assessment: 67 year old female with diabetic foot exam, soft tissue lesion right foot  Plan: -All treatment options discussed with the patient including all alternatives, risks, complications.  -From a diabetes standpoint she seems to be doing well.  Continue daily foot inspection with glucose control. -I discussed with her surgical excision of the soft tissue mass of the right midfoot.  We discussed the procedure as well as postoperative course and she wishes to proceed.  Will plan under this under local anesthesia in the office likely on July 10. -Patient encouraged to call the office with any questions, concerns, change in symptoms.   Vivi Barrack DPM

## 2022-10-18 ENCOUNTER — Ambulatory Visit: Payer: Medicare Other | Admitting: Physical Therapy

## 2022-10-18 ENCOUNTER — Encounter: Payer: Self-pay | Admitting: Physical Therapy

## 2022-10-18 DIAGNOSIS — M542 Cervicalgia: Secondary | ICD-10-CM

## 2022-10-18 DIAGNOSIS — M6281 Muscle weakness (generalized): Secondary | ICD-10-CM

## 2022-10-18 DIAGNOSIS — R519 Headache, unspecified: Secondary | ICD-10-CM | POA: Diagnosis not present

## 2022-10-18 DIAGNOSIS — G8929 Other chronic pain: Secondary | ICD-10-CM
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
## 2022-10-20 ENCOUNTER — Encounter: Payer: Self-pay | Admitting: Physical Therapy

## 2022-10-20 ENCOUNTER — Telehealth: Payer: Self-pay | Admitting: Podiatry

## 2022-10-20 ENCOUNTER — Ambulatory Visit: Payer: Medicare Other | Admitting: Physical Therapy

## 2022-10-20 DIAGNOSIS — M6281 Muscle weakness (generalized): Secondary | ICD-10-CM | POA: Diagnosis not present

## 2022-10-20 DIAGNOSIS — K080 Exfoliation of teeth due to systemic causes: Secondary | ICD-10-CM | POA: Diagnosis not present

## 2022-10-20 DIAGNOSIS — R519 Headache, unspecified: Secondary | ICD-10-CM | POA: Diagnosis not present

## 2022-10-20 DIAGNOSIS — M542 Cervicalgia: Secondary | ICD-10-CM | POA: Diagnosis not present

## 2022-10-20 DIAGNOSIS — G8929 Other chronic pain: Secondary | ICD-10-CM | POA: Diagnosis not present
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
## 2022-10-25 ENCOUNTER — Ambulatory Visit: Payer: Medicare Other | Admitting: Physical Therapy

## 2022-10-25 ENCOUNTER — Encounter: Payer: Self-pay | Admitting: Physical Therapy

## 2022-10-25 DIAGNOSIS — G8929 Other chronic pain: Secondary | ICD-10-CM | POA: Diagnosis not present

## 2022-10-25 DIAGNOSIS — R519 Headache, unspecified: Secondary | ICD-10-CM | POA: Diagnosis not present

## 2022-10-25 DIAGNOSIS — M6281 Muscle weakness (generalized): Secondary | ICD-10-CM

## 2022-10-25 DIAGNOSIS — M542 Cervicalgia: Secondary | ICD-10-CM

## 2022-10-25 NOTE — Therapy (Signed)
OUTPATIENT PHYSICAL THERAPY CERVICAL TREATMENT    Patient Name: Veronica Galvan MRN: [id number] DOB:February 26, 1956, 67 y.o., female Today's Date: 10/25/2022  PT End of Session - 10/25/22 0755     Visit Number 84    Date for PT Re-Evaluation 10/21/22    PT Start Time 0755    PT Stop Time 0840    PT Time Calculation (min) 45 min    Activity Tolerance Patient tolerated treatment well    Behavior During Therapy [facility] for tasks assessed/performed               Past Medical History:  Diagnosis Date   Allergy    Anxiety    Arthritis    hands   Cataract    Diabetes mellitus without complication (HCC)    type 2   Family history of adverse reaction to anesthesia    son has malignant hyperthermia, daughter does not daughter recently had c section without problems   GERD (gastroesophageal reflux disease)    Headache    sinus   Hyperlipidemia    Hypertension    PONV (postoperative nausea and vomiting)    nausea only   Ulcer, stomach peptic yrs ago   Past Surgical History:  Procedure Laterality Date   APPENDECTOMY     both hells bone spur repair     both heels with metal clips   both shoulder rotator cuff repair     CESAREAN SECTION     x 1   COLONOSCOPY WITH PROPOFOL N/A 09/07/2016   Procedure: COLONOSCOPY WITH PROPOFOL;  Surgeon: Charolett Bumpers, MD;  Location: [facility];  Service: Endoscopy;  Laterality: N/A;   colonscopy  06/2011   polyps   EYE SURGERY     FRACTURE SURGERY     TUBAL LIGATION     VESICO-VAGINAL FISTULA REPAIR     Patient Active Problem List   Diagnosis Date Noted   Gastroesophageal reflux disease 11/16/2021   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 08/18/2021   Dermatofibroma 08/18/2021   History of malignant neoplasm of skin 08/18/2021   Lentigo 08/18/2021   Melanocytic nevi of trunk 08/18/2021   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   Rosacea 08/18/2021   Actinic keratosis 08/18/2021   Sensorineural  hearing loss (SNHL) of left ear with unrestricted hearing of right ear 07/26/2021   Tinnitus of left ear 07/26/2021   Acute recurrent maxillary sinusitis 06/22/2021   Primary hypertension 01/12/2021   Hyperlipidemia associated with type 2 diabetes mellitus (HCC) 01/12/2021   Arthritis 01/12/2021   Type II diabetes mellitus (HCC) 11/25/2019   Ingrown toenail 09/05/2018   Neck pain 01/29/2018   Tick bite 10/24/2017    PCP: Ria Clock  REFERRING PROVIDER: Ocie Doyne  REFERRING DIAG: M54.2  THERAPY DIAG:  Cervicalgia  Chronic intractable headache, unspecified headache type  Muscle weakness (generalized)  Rationale for Evaluation and Treatment Rehabilitation  ONSET DATE: 11/08/21  SUBJECTIVE:  SUBJECTIVE STATEMENT: Reports still with a HA but less lethargy.  Reports that she has not exercised in the past week PERTINENT HISTORY:  Arthritis, HTN, bilateral rotator cuff surgery 15-51yrs ago, DM  PAIN:  Are you having pain? Yes: NPRS scale: 7/10 Pain location: head and neck, shoulders  Pain description: tight  Aggravating factors: moving Relieving factors: massage, standing in the shower  WEIGHT BEARING RESTRICTIONS No  FALLS:  Has patient fallen in last 6 months? No  LIVING ENVIRONMENT: Lives with: lives alone Lives in: House/apartment Stairs: Yes: External: 3 steps; can reach both Has following equipment at home: None  OCCUPATION: reitred  PLOF: Independent  PATIENT GOALS to loosen it up, find out what is causing my headaches   OBJECTIVE:   DIAGNOSTIC FINDINGS:   FINDINGS: Brain: No acute infarct, mass effect or extra-axial collection. No acute or chronic hemorrhage. Normal white matter signal, parenchymal volume and CSF spaces. The midline structures are  normal.  Skull and upper cervical spine: Normal calvarium and skull base. Visualized upper cervical spine and soft tissues are normal.   Sinuses/Orbits:No paranasal sinus fluid levels or advanced mucosal thickening. No mastoid or middle ear effusion. Normal orbits.   IMPRESSION: Normal brain MRI.  PATIENT SURVEYS:  FOTO 53/100 ,  April 08, 2022 has been up to 13 and today was 23 but has been doing a lot and lifting and is back doing much more than she was.  January 4,2024 57/100 she does report tha tshe wnet backwards from her previous due to her being able to do so much more but is hurting more   COGNITION: Overall cognitive status: Within functional limits for tasks assessed  SENSATION: WFL  POSTURE: rounded shoulders, forward head, and increased thoracic kyphosis  PALPATION: TTP and sore along upper trap and cervical paraspinals, trigger points in R and L UT   CERVICAL ROM:   Active ROM A/PROM (deg) eval 12/30/21 01/05/22 9/28 10/31 04/05/22  Flexion 100% no pain WFL WFL WNL   WNL  Extension Mild limitations w/pain [facility]  WFL WNL   WNL  Right lateral flexion Mod limitation w/pain Mild limitations Mild limitations Severe limitation, leans with trunk  Decreased 75%  Decreased 50%  Left lateral flexion Mod limitation w/pain Mod limitations Mild limitations Severe limitation, leans with trunk  Decreased 75% Decreased  50%  Right rotation 75% w/pain Mild limitation w/pain WFL pain at end range  Mild limitation  Decreased 25%  Decreased 25%  Left rotation 75% w/pain  Mild limitation w/pain  WFL pain at end range  Mild limitation  Decreased 25% Decreased  25%   (Blank rows = not tested)  UPPER EXTREMITY ROM: WFL, limited in IR and ER due to prior RC surgery years ago   UPPER EXTREMITY MMT:  MMT Right eval Left eval Right 01/05/22 Left 01/05/22 R 9/28 L 9/28  Shoulder flexion 4+ 4+ 5 5 5 5   Shoulder extension        Shoulder abduction 4+ very painful 4+ 4+ with pain 5 5 5    Shoulder adduction        Shoulder extension        Shoulder internal rotation 4+ 4+ 5 5 5 5   Shoulder external rotation     5 5  Middle trapezius        Lower trapezius        Elbow flexion 5 5      Elbow extension 5 5      Wrist flexion  Wrist extension        Wrist ulnar deviation        Wrist radial deviation        Wrist pronation        Wrist supination        Grip strength         (Blank rows = not tested) 05/05/22 LE MMT  right 4+/5, left 4/5  SLR 40 degrees with pain in the legs and the back  Cannot cross her legs due to tight piriformis 25 degrees hip ER  Abdominal strength 2/5 unable to sit up or hold at 40 degrees  5x STS 21 seconds 06/14/22 TUG 10 seconds  5XSTS 16 seconds  Right 4+/5, left 4+/5  Manually I can cross her legs fully, she reports able to get   Socks on now, she can on her own without hands get her  Heels to mid patella  SLR to 55 degrees  Can hold plank 10 seconds elbows and toes  Had her A1C go down from 8.2 to 7.8   07/14/22  can hold plank 20 seconds x4  TUG 7 seconds  08/16/22  still very difficult to put socks on, is getting closer to being able to cross leg over.  Heels to superior patella. Weight today was 202#.  5XSTS 11 seconds.  Planks 30 seconds x3 and 20s x 1  09/20/22  A1C = 7.1, weight 198#, SLR 65 degrees, Can get both heels just above the patella, planks 35 seconds x 2   TODAY'S TREATMENT:  10/25/22 Weight 196# Glucose 109 this AM reports that this is the lowest she has had since testing 3 laps around the back building no rest good pace 25# rows 2x15 25# lats 2x15 Leg press 60# 2x10 Leg curls 25#  Leg extension 10# Passive stretch LE's   10/20/22 STM to the bilateral upper traps and neck, into rhomboids Passive stretch to the LE's Discussed with her concussion symptoms and that this is a brain injury and that the brain needs to rest, she needs to back off of some of her activity especially if she is feeling bad. See  clinical impression below  10/18/22 Weight 196.5# 3 laps around the back building no rest, good pace UBE 4 minutes level 4 15# biceps 10# hip extension and abduction 70# leg press PROM HS piriformis  10/12/22 STM to the right upper trap and rhomboid Passive stretch to the LE's LEg extension 15# Leg curls 35# 25# rows 25# lats 10# hip extension and abduction 20# AR press Plank 60 seconds  10/11/22 Nustep level 6 x 5 minutes Gait outside fast pace two laps 10# hip extension and abduction Leg curls 25# Leg extension 10# Leg press 40# Plank 60 seconds Clamshells green tband 25# lat pulls  10/06/22 Weight 197# UBE level 5 x 6 minutes Bike level 5 x 5 minutes AR press 15# 5# hip extension and abduction Calf stretch HS and piriformis stretches Crunches Feet on ball K2C, rotation, bridge, isometric abs    10/04/22 Elliptical 2 minutes level 3 Nustep level 4 x 5 minutes Standing on airex 10# rows and straight arm pulls Seated row 20# 2x15 Leg press 30# 2x15 Passive stretch to calves, HS and piriformis She was very tight and had difficulty, we gave handout and put in her phone that she really needed, really reinforced this  09/22/22 2 laps brisk pace but had to slow down on the second lap 25# leg curls 3x10 10# leg extension  2x15 60# leg press, 20# single leg with piriformis stretch STM to the upper traps, rhomboids and cervical area Passive stretch of the pectorals, piriformis and the HS  09/20/22 Weight 198# 2 laps brisk pace no rest Bike level 4 x 6 minutes with power bursts up to 500 watts x 3 20# low squat 3x5 20# axe chop Planks 35seconds x 2 Power ball slams Passive stretch HS and piriformis  09/14/22 Weight 199# Elliptical level 4 x 4 minutes 20# sit to stand from various heights lowest 15" Leg press 30# 2x10 single legs, 70# both legs On bosu ball balance and squats 2 ways Volleyball Planks 30 sec x 2 Passive stretch HS, piriformis and quads 20#  axe chops  09/13/22 UBE level 4.5 x 6 minutes HS curls 35# 2x10 Leg extension10# 3x10 20# farmer carry 1 lap each hand 5# overhead carry both arms together On bosu 3# biceps, alternating press and lateral raise Seated row 25# 2x15 25# lats 2x15 5# hip abduction, extension and flexion volleyball  09/08/22 UBE level 4 x 6 minutes 2 laps brisk walk Pushing tmill 3x30seconds 25# rows 25# lats 10# trunk rotation from low to high 20# trunk chops PAssive LE stretches  09/06/22 UBE level 4 x 6 minutes Gait 2 laps brisk pace no rest 60# leg press 3x10 35# leg curls 2x10 10# leg extension 2x10 50# resisted gait all directions Bike level 4 x 6 minutes with 3 power bursts 500watts, 400 watts, 455 watts On bosu balance and reaching and then squats 20# trunk chops 2x10  09/01/22 Bike level 4 with 3 power bursts x 6 minutes up to 350 watts Leg press 60#  On airex 15# straight arm pulls 20# AR press 60# leg press 3 x10 10# hip extension and abduction and flexion On bosu 3# biceps and overhead press Wobble board balance Planks 30s x 2    PATIENT EDUCATION:  Education details: POC Person educated: Patient Education method: Explanation Education comprehension: verbalized understanding   HOME EXERCISE PROGRAM: Access Code: [email or web address] URL: [email or web address] Date: 12/01/2021 Prepared by: Cassie Freer   ASSESSMENT:  CLINICAL IMPRESSION: Patient has not done much exercise over the past week due to the head injury, she has still continued to lose some weight and reports that she had her lowest blood glucose reading.  All in all she is doing well and this injury may have set her back some but still heading in the right direction, we continue to speak about her pan for after PT REHAB POTENTIAL: Good  CLINICAL DECISION MAKING: Stable/uncomplicated  EVALUATION COMPLEXITY: Low   GOALS: Goals reviewed with patient? Yes  SHORT TERM GOALS: Target date:  12/29/21  Patient will be independent with initial HEP.  Goal status: MET  LONG TERM GOALS: Target date: 02/26/22  Patient will be independent with advanced/ongoing HEP to improve outcomes and carryover.  Goal status: continuing 09/20/22  2.  Patient will report 75% improvement in neck pain to improve QOL.   Baseline: 9/28- not even 50% improvement maybe 40% Goal status: met  3.  Patient will demonstrate full pain free cervical ROM to complete ADLs and play with grandchildren with more ease. Goal status: met 4.  Patient will demonstrate improved posture to decrease muscle imbalance and tightness.  Baseline: increased tightness along cervical paraspinals Goal status: progressing still tight piriformis 08/16/22  5.  Patient will report 36 on FOTO to demonstrate improved functional ability.  Baseline:  9/28- 64  Goal status: MET but has regressed as she  is doing more and more  6.  Will demonstrate improved core strength as evidenced by passing double leg lower test  Baseline:  Goal status: progressing 08/16/22 7.  Average 11,000 steps a day on her fit bit over a month period.  Met 08/25/22   PLAN: PT FREQUENCY: 2x/week  PT DURATION: 4 weeks  PLANNED INTERVENTIONS: Therapeutic exercises, Therapeutic activity, Neuromuscular re-education, Balance training, Gait training, Patient/Family education, Self Care, Joint mobilization, Dry Needling, Spinal manipulation, Spinal mobilization, Cryotherapy, Moist heat, Taping, Vasopneumatic device, Traction, Ionotophoresis 4mg /ml Dexamethasone, and Manual therapy  PLAN FOR NEXT SESSION:  d/c next visit if all is doing well Stacie Glaze, PT 10/25/2022, 7:56 AM   [facility] [address] [place], [place], [address] Phone: [phone]   Fax:  [phone]

## 2022-10-27 ENCOUNTER — Ambulatory Visit: Payer: Medicare Other | Admitting: Physical Therapy

## 2022-10-27 ENCOUNTER — Encounter: Payer: Self-pay | Admitting: Physical Therapy

## 2022-10-27 DIAGNOSIS — M6281 Muscle weakness (generalized): Secondary | ICD-10-CM | POA: Diagnosis not present

## 2022-10-27 DIAGNOSIS — M542 Cervicalgia: Secondary | ICD-10-CM

## 2022-10-27 DIAGNOSIS — R519 Headache, unspecified: Secondary | ICD-10-CM | POA: Diagnosis not present

## 2022-10-27 DIAGNOSIS — G8929 Other chronic pain: Secondary | ICD-10-CM | POA: Diagnosis not present

## 2022-10-27 NOTE — Therapy (Signed)
OUTPATIENT PHYSICAL THERAPY CERVICAL TREATMENT    Patient Name: Veronica Galvan MRN: [id number] DOB:05-05-1955, 67 y.o., female Today's Date: 10/27/2022  PT End of Session - 10/27/22 0815     Visit Number 85    PT Start Time 0755    PT Stop Time 0840    PT Time Calculation (min) 45 min    Activity Tolerance Patient tolerated treatment well    Behavior During Therapy WFL for tasks assessed/performed               Past Medical History:  Diagnosis Date   Allergy    Anxiety    Arthritis    hands   Cataract    Diabetes mellitus without complication (HCC)    type 2   Family history of adverse reaction to anesthesia    son has malignant hyperthermia, daughter does not daughter recently had c section without problems   GERD (gastroesophageal reflux disease)    Headache    sinus   Hyperlipidemia    Hypertension    PONV (postoperative nausea and vomiting)    nausea only   Ulcer, stomach peptic yrs ago   Past Surgical History:  Procedure Laterality Date   APPENDECTOMY     both hells bone spur repair     both heels with metal clips   both shoulder rotator cuff repair     CESAREAN SECTION     x 1   COLONOSCOPY WITH PROPOFOL N/A 09/07/2016   Procedure: COLONOSCOPY WITH PROPOFOL;  Surgeon: Charolett Bumpers, MD;  Location: [facility];  Service: Endoscopy;  Laterality: N/A;   colonscopy  06/2011   polyps   EYE SURGERY     FRACTURE SURGERY     TUBAL LIGATION     VESICO-VAGINAL FISTULA REPAIR     Patient Active Problem List   Diagnosis Date Noted   Gastroesophageal reflux disease 11/16/2021   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 08/18/2021   Dermatofibroma 08/18/2021   History of malignant neoplasm of skin 08/18/2021   Lentigo 08/18/2021   Melanocytic nevi of trunk 08/18/2021   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   Rosacea 08/18/2021   Actinic keratosis 08/18/2021   Sensorineural hearing loss (SNHL) of left ear with  unrestricted hearing of right ear 07/26/2021   Tinnitus of left ear 07/26/2021   Acute recurrent maxillary sinusitis 06/22/2021   Primary hypertension 01/12/2021   Hyperlipidemia associated with type 2 diabetes mellitus (HCC) 01/12/2021   Arthritis 01/12/2021   Type II diabetes mellitus (HCC) 11/25/2019   Ingrown toenail 09/05/2018   Neck pain 01/29/2018   Tick bite 10/24/2017    PCP: Ria Clock  REFERRING PROVIDER: Ocie Doyne  REFERRING DIAG: M54.2  THERAPY DIAG:  Cervicalgia  Chronic intractable headache, unspecified headache type  Muscle weakness (generalized)  Rationale for Evaluation and Treatment Rehabilitation  ONSET DATE: 11/08/21  SUBJECTIVE:  SUBJECTIVE STATEMENT: Has been very busy with the VBS that she is cooking for PERTINENT HISTORY:  Arthritis, HTN, bilateral rotator cuff surgery 15-44yrs ago, DM  PAIN:  Are you having pain? Yes: NPRS scale: 7/10 Pain location: head and neck, shoulders  Pain description: tight  Aggravating factors: moving Relieving factors: massage, standing in the shower  WEIGHT BEARING RESTRICTIONS No  FALLS:  Has patient fallen in last 6 months? No  LIVING ENVIRONMENT: Lives with: lives alone Lives in: House/apartment Stairs: Yes: External: 3 steps; can reach both Has following equipment at home: None  OCCUPATION: reitred  PLOF: Independent  PATIENT GOALS to loosen it up, find out what is causing my headaches   OBJECTIVE:   DIAGNOSTIC FINDINGS:   FINDINGS: Brain: No acute infarct, mass effect or extra-axial collection. No acute or chronic hemorrhage. Normal white matter signal, parenchymal volume and CSF spaces. The midline structures are normal.  Skull and upper cervical spine: Normal calvarium and skull  base. Visualized upper cervical spine and soft tissues are normal.   Sinuses/Orbits:No paranasal sinus fluid levels or advanced mucosal thickening. No mastoid or middle ear effusion. Normal orbits.   IMPRESSION: Normal brain MRI.  PATIENT SURVEYS:  FOTO 53/100 ,  April 08, 2022 has been up to 8 and today was 36 but has been doing a lot and lifting and is back doing much more than she was.  January 4,2024 57/100 she does report tha tshe wnet backwards from her previous due to her being able to do so much more but is hurting more   COGNITION: Overall cognitive status: Within functional limits for tasks assessed  SENSATION: WFL  POSTURE: rounded shoulders, forward head, and increased thoracic kyphosis  PALPATION: TTP and sore along upper trap and cervical paraspinals, trigger points in R and L UT   CERVICAL ROM:   Active ROM A/PROM (deg) eval 12/30/21 01/05/22 9/28 10/31 04/05/22  Flexion 100% no pain WFL WFL WNL   WNL  Extension Mild limitations w/pain [facility]  WFL WNL   WNL  Right lateral flexion Mod limitation w/pain Mild limitations Mild limitations Severe limitation, leans with trunk  Decreased 75%  Decreased 50%  Left lateral flexion Mod limitation w/pain Mod limitations Mild limitations Severe limitation, leans with trunk  Decreased 75% Decreased  50%  Right rotation 75% w/pain Mild limitation w/pain WFL pain at end range  Mild limitation  Decreased 25%  Decreased 25%  Left rotation 75% w/pain  Mild limitation w/pain  WFL pain at end range  Mild limitation  Decreased 25% Decreased  25%   (Blank rows = not tested)  UPPER EXTREMITY ROM: WFL, limited in IR and ER due to prior RC surgery years ago   UPPER EXTREMITY MMT:  MMT Right eval Left eval Right 01/05/22 Left 01/05/22 R 9/28 L 9/28  Shoulder flexion 4+ 4+ 5 5 5 5   Shoulder extension        Shoulder abduction 4+ very painful 4+ 4+ with pain 5 5 5   Shoulder adduction        Shoulder extension        Shoulder internal  rotation 4+ 4+ 5 5 5 5   Shoulder external rotation     5 5  Middle trapezius        Lower trapezius        Elbow flexion 5 5      Elbow extension 5 5      Wrist flexion        Wrist extension  Wrist ulnar deviation        Wrist radial deviation        Wrist pronation        Wrist supination        Grip strength         (Blank rows = not tested) 05/05/22 LE MMT  right 4+/5, left 4/5  SLR 40 degrees with pain in the legs and the back  Cannot cross her legs due to tight piriformis 25 degrees hip ER  Abdominal strength 2/5 unable to sit up or hold at 40 degrees  5x STS 21 seconds 06/14/22 TUG 10 seconds  5XSTS 16 seconds  Right 4+/5, left 4+/5  Manually I can cross her legs fully, she reports able to get   Socks on now, she can on her own without hands get her  Heels to mid patella  SLR to 55 degrees  Can hold plank 10 seconds elbows and toes  Had her A1C go down from 8.2 to 7.8   07/14/22  can hold plank 20 seconds x4  TUG 7 seconds  08/16/22  still very difficult to put socks on, is getting closer to being able to cross leg over.  Heels to superior patella. Weight today was 202#.  5XSTS 11 seconds.  Planks 30 seconds x3 and 20s x 1  09/20/22  A1C = 7.1, weight 198#, SLR 65 degrees, Can get both heels just above the patella, planks 35 seconds x 2  10/27/22  Weight 195#,    TODAY'S TREATMENT:  10/27/22 Weight 195# 3 laps around the back building 35# leg curls 10# leg extension 60# Leg press 25# Lats  10/25/22 Weight 196# Glucose 109 this AM reports that this is the lowest she has had since testing 3 laps around the back building no rest good pace 25# rows 2x15 25# lats 2x15 Leg press 60# 2x10 Leg curls 25#  Leg extension 10# Passive stretch LE's   10/20/22 STM to the bilateral upper traps and neck, into rhomboids Passive stretch to the LE's Discussed with her concussion symptoms and that this is a brain injury and that the brain needs to rest, she needs to back off  of some of her activity especially if she is feeling bad. See clinical impression below  10/18/22 Weight 196.5# 3 laps around the back building no rest, good pace UBE 4 minutes level 4 15# biceps 10# hip extension and abduction 70# leg press PROM HS piriformis  10/12/22 STM to the right upper trap and rhomboid Passive stretch to the LE's LEg extension 15# Leg curls 35# 25# rows 25# lats 10# hip extension and abduction 20# AR press Plank 60 seconds  10/11/22 Nustep level 6 x 5 minutes Gait outside fast pace two laps 10# hip extension and abduction Leg curls 25# Leg extension 10# Leg press 40# Plank 60 seconds Clamshells green tband 25# lat pulls  10/06/22 Weight 197# UBE level 5 x 6 minutes Bike level 5 x 5 minutes AR press 15# 5# hip extension and abduction Calf stretch HS and piriformis stretches Crunches Feet on ball K2C, rotation, bridge, isometric abs    10/04/22 Elliptical 2 minutes level 3 Nustep level 4 x 5 minutes Standing on airex 10# rows and straight arm pulls Seated row 20# 2x15 Leg press 30# 2x15 Passive stretch to calves, HS and piriformis She was very tight and had difficulty, we gave handout and put in her phone that she really needed, really reinforced this  09/22/22 2 laps  brisk pace but had to slow down on the second lap 25# leg curls 3x10 10# leg extension 2x15 60# leg press, 20# single leg with piriformis stretch STM to the upper traps, rhomboids and cervical area Passive stretch of the pectorals, piriformis and the HS  09/20/22 Weight 198# 2 laps brisk pace no rest Bike level 4 x 6 minutes with power bursts up to 500 watts x 3 20# low squat 3x5 20# axe chop Planks 35seconds x 2 Power ball slams Passive stretch HS and piriformis  09/14/22 Weight 199# Elliptical level 4 x 4 minutes 20# sit to stand from various heights lowest 15" Leg press 30# 2x10 single legs, 70# both legs On bosu ball balance and squats 2  ways Volleyball Planks 30 sec x 2 Passive stretch HS, piriformis and quads 20# axe chops  09/13/22 UBE level 4.5 x 6 minutes HS curls 35# 2x10 Leg extension10# 3x10 20# farmer carry 1 lap each hand 5# overhead carry both arms together On bosu 3# biceps, alternating press and lateral raise Seated row 25# 2x15 25# lats 2x15 5# hip abduction, extension and flexion volleyball  09/08/22 UBE level 4 x 6 minutes 2 laps brisk walk Pushing tmill 3x30seconds 25# rows 25# lats 10# trunk rotation from low to high 20# trunk chops PAssive LE stretches  09/06/22 UBE level 4 x 6 minutes Gait 2 laps brisk pace no rest 60# leg press 3x10 35# leg curls 2x10 10# leg extension 2x10 50# resisted gait all directions Bike level 4 x 6 minutes with 3 power bursts 500watts, 400 watts, 455 watts On bosu balance and reaching and then squats 20# trunk chops 2x10  09/01/22 Bike level 4 with 3 power bursts x 6 minutes up to 350 watts Leg press 60#  On airex 15# straight arm pulls 20# AR press 60# leg press 3 x10 10# hip extension and abduction and flexion On bosu 3# biceps and overhead press Wobble board balance Planks 30s x 2    PATIENT EDUCATION:  Education details: POC Person educated: Patient Education method: Explanation Education comprehension: verbalized understanding   HOME EXERCISE PROGRAM: Access Code: [email or web address] URL: [email or web address] Date: 12/01/2021 Prepared by: Cassie Freer   ASSESSMENT:  CLINICAL IMPRESSION: Overall patient is much more fit and active than when she started, her A1C is lower, her weight is down to 195# down from 212#, she has less intensity of HA but still with about the same frequency of HA, Her SLR is now 70 degrees, she can now cross her leg to where the entire ankle I past the superior patella with her pulling with her hands, she can plank to 60 seconds.  She continues to be tight and has the HA and the knots in the upper traps, her LE's  and UE's are very tight but overall much better,  walking the 3 laps today she was not short of breath.  WE have discussed her plan after PT and she feels good about it with a walking program, water aerobics and some classes at the gym, we talked about accountability as well for caloris in and out REHAB POTENTIAL: Good  CLINICAL DECISION MAKING: Stable/uncomplicated  EVALUATION COMPLEXITY: Low   GOALS: Goals reviewed with patient? Yes  SHORT TERM GOALS: Target date: 12/29/21  Patient will be independent with initial HEP.  Goal status: MET  LONG TERM GOALS: Target date: 02/26/22  Patient will be independent with advanced/ongoing HEP to improve outcomes and carryover.  Goal status: met 10/27/22  2.  Patient  will report 75% improvement in neck pain to improve QOL.   Baseline: 9/28- not even 50% improvement maybe 40% Goal status: met  3.  Patient will demonstrate full pain free cervical ROM to complete ADLs and play with grandchildren with more ease. Goal status: met 4.  Patient will demonstrate improved posture to decrease muscle imbalance and tightness.  Baseline: increased tightness along cervical paraspinals Goal status: met 10/27/22  5.  Patient will report 75 on FOTO to demonstrate improved functional ability.  Baseline:  9/28- 64  Goal status: MET but has regressed as she is doing more and more  6.  Will demonstrate improved core strength as evidenced by passing double leg lower test  Baseline:  Goal status: progressing 08/16/22 7.  Average 11,000 steps a day on her fit bit over a month period.  Met 08/25/22   PLAN: PT FREQUENCY: 2x/week  PT DURATION: 4 weeks  PLANNED INTERVENTIONS: Therapeutic exercises, Therapeutic activity, Neuromuscular re-education, Balance training, Gait training, Patient/Family education, Self Care, Joint mobilization, Dry Needling, Spinal manipulation, Spinal mobilization, Cryotherapy, Moist heat, Taping, Vasopneumatic device, Traction,  Ionotophoresis 4mg /ml Dexamethasone, and Manual therapy  PLAN FOR NEXT SESSION:  D/C goals met, has a good plan n place to continue to progress on her own Stacie Glaze, PT 10/27/2022, 8:16 AM   [facility]. [place], [place], [address] Phone: [phone]   Fax:  [phone]

## 2022-11-05 ENCOUNTER — Ambulatory Visit (INDEPENDENT_AMBULATORY_CARE_PROVIDER_SITE_OTHER): Payer: Medicare Other

## 2022-11-05 ENCOUNTER — Ambulatory Visit
Admission: EM | Admit: 2022-11-05 | Discharge: 2022-11-05 | Disposition: A | Discharge status: Home / Self Care | Payer: Medicare Other | Source: Home / Self Care

## 2022-11-05 DIAGNOSIS — S42351A Displaced comminuted fracture of shaft of humerus, right arm, initial encounter for closed fracture: Secondary | ICD-10-CM | POA: Diagnosis not present

## 2022-11-05 DIAGNOSIS — S42291A Other displaced fracture of upper end of right humerus, initial encounter for closed fracture: Secondary | ICD-10-CM | POA: Diagnosis not present

## 2022-11-05 MED ORDER — IBUPROFEN 800 MG PO TABS: 800.0000 mg | ORAL_TABLET | Freq: Three times a day (TID) | ORAL | 0 refills | Status: DC

## 2022-11-05 MED ORDER — IBUPROFEN 800 MG PO TABS
800.0000 mg | ORAL_TABLET | Freq: Once | ORAL | Status: AC
  Administered 2022-11-05: 800 mg via ORAL

## 2022-11-05 NOTE — ED Triage Notes (Addendum)
Pt states she fell on deck ~9am-injured right shoulder-last tylenol ~9am-NAD-slow gait/holding RUE with left hand

## 2022-11-05 NOTE — ED Provider Notes (Signed)
[facility]  Note:  This document was prepared using Conservation officer, historic buildings and may include unintentional dictation errors.  MRN: [id number] DOB: 03/16/1956  Subjective:   Veronica Galvan is a 67 y.o. female presenting for 1 day history of severe right shoulder pain, no range of motion.  Patient reports that she fell on an outstretched hand on her deck.  No head injury, loss of consciousness.  Patient has been taking Tylenol for her pain.  Has also done some icing.  No open wounds of the arm or shoulder.  Patient does not want to take narcotic pain medicine, has had an allergic reaction to codeine.  She also cannot tolerate NSAIDs.  Declined any medications in clinic as she took Tylenol prior to coming in.  No current facility-administered medications for this encounter.  Current Outpatient Medications:    ALPRAZolam (XANAX) 0.5 MG tablet, Take 1 tablet (0.5 mg total) by mouth at bedtime as needed for anxiety., Disp: 30 tablet, Rfl: 0   Ascorbic Acid (VITAMIN C) 1000 MG tablet, Take 1,000 mg by mouth every morning., Disp: , Rfl:    aspirin EC 81 MG tablet, Take 81 mg by mouth every morning., Disp: , Rfl:    atorvastatin (LIPITOR) 40 MG tablet, Take 1 tablet (40 mg total) by mouth every evening., Disp: 90 tablet, Rfl: 3   Calcium Carb-Cholecalciferol (CALCIUM 600+D3 PO), Take 1 tablet by mouth every morning., Disp: , Rfl:    celecoxib (CELEBREX) 200 MG capsule, Take 1 capsule (200 mg total) by mouth daily., Disp: 90 capsule, Rfl: 3   Cholecalciferol (VITAMIN D) 2000 units tablet, Take 2,000 Units by mouth daily., Disp: , Rfl:    Coenzyme Q10 300 MG CAPS, Take 1 capsule by mouth every morning., Disp: , Rfl:    diclofenac Sodium (VOLTAREN) 1 % GEL, Apply topically 4 (four) times daily. (Patient not taking: Reported on 08/05/2022), Disp: , Rfl:    fluconazole (DIFLUCAN) 100 MG tablet, Take 1 tablet (100 mg total) by mouth daily. (Patient not taking: Reported on  09/13/2022), Disp: 5 tablet, Rfl: 0   glucose blood test strip, Use as instructed, Disp: 100 each, Rfl: 12   levocetirizine (XYZAL) 5 MG tablet, Take 1 tablet (5 mg total) by mouth every evening., Disp: 90 tablet, Rfl: 3   lisinopril (ZESTRIL) 5 MG tablet, Take 1 tablet (5 mg total) by mouth 2 (two) times daily., Disp: 180 tablet, Rfl: 3   LUTEIN-ZEAXANTHIN PO, Take 1 tablet by mouth every morning., Disp: , Rfl:    Multiple Vitamins-Minerals (MULTIVITAMIN WITH MINERALS) tablet, Take 1 tablet by mouth every morning., Disp: , Rfl:    nystatin-triamcinolone ointment (MYCOLOG), Apply 1 Application topically 2 (two) times daily., Disp: 60 g, Rfl: 0   Omega-3 Fatty Acids (EQL OMEGA 3 FISH OIL) 1400 MG CAPS, Take 2 capsules by mouth daily., Disp: , Rfl:    omeprazole (PRILOSEC) 20 MG capsule, Take 1 capsule (20 mg total) by mouth every morning., Disp: 90 capsule, Rfl: 3   Semaglutide, 2 MG/DOSE, 8 MG/3ML SOPN, Inject 2 mg as directed once a week., Disp: 3 mL, Rfl: 0   SYNJARDY XR 12.08-998 MG TB24, Take 1 tablet by mouth 2 (two) times daily., Disp: 180 tablet, Rfl: 3   UNABLE TO FIND, Med Name: Cinnamon/Cinsilin, Disp: , Rfl:    Vitamin D, Ergocalciferol, (DRISDOL) 1.25 MG (50000 UNIT) CAPS capsule, Take 1 capsule (50,000 Units total) by mouth every 7 (seven) days for 12 doses.,  Disp: 12 capsule, Rfl: 0   VITAMIN E PO, Take by mouth., Disp: , Rfl:    Allergies  Allergen Reactions   Codeine Hives and Itching   Benzonatate Rash   Epinephrine Palpitations    Past Medical History:  Diagnosis Date   Allergy    Anxiety    Arthritis    hands   Cataract    Diabetes mellitus without complication (HCC)    type 2   Family history of adverse reaction to anesthesia    son has malignant hyperthermia, daughter does not daughter recently had c section without problems   GERD (gastroesophageal reflux disease)    Headache    sinus   Hyperlipidemia    Hypertension    PONV (postoperative nausea and  vomiting)    nausea only   Ulcer, stomach peptic yrs ago     Past Surgical History:  Procedure Laterality Date   APPENDECTOMY     both hells bone spur repair     both heels with metal clips   both shoulder rotator cuff repair     CESAREAN SECTION     x 1   COLONOSCOPY WITH PROPOFOL N/A 09/07/2016   Procedure: COLONOSCOPY WITH PROPOFOL;  Surgeon: Charolett Bumpers, MD;  Location: [facility];  Service: Endoscopy;  Laterality: N/A;   colonscopy  06/2011   polyps   EYE SURGERY     FRACTURE SURGERY     TUBAL LIGATION     VESICO-VAGINAL FISTULA REPAIR      Family History  Problem Relation Age of Onset   Hypertension Mother    COPD Mother    Cancer Mother    Arthritis Mother    Hypertension Father    Hyperlipidemia Father    Diabetes Father    Cancer Father    Alcohol abuse Father    Diabetes Brother    Alcohol abuse Brother    Hypertension Brother    Arthritis Maternal Grandmother    Birth defects Maternal Grandmother    Diabetes Paternal Grandmother    Heart disease Paternal Grandfather    Diabetes Paternal Aunt    Diabetes Paternal Aunt    Obesity Son     Social History   Tobacco Use   Smoking status: Former    Packs/day: 1.00    Years: 14.00    Additional pack years: 0.00    Total pack years: 14.00    Types: Cigarettes   Smokeless tobacco: Never   Tobacco comments:    quit 31 yrs ago  Substance Use Topics   Alcohol use: Yes    Comment: rare   Drug use: No    ROS   Objective:   Vitals: BP (!) 191/82 (BP Location: Left Arm)   Pulse 88   Temp 99 F (37.2 C) (Oral)   Resp 20   SpO2 95%   Physical Exam Constitutional:      General: She is not in acute distress.    Appearance: Normal appearance. She is well-developed. She is not ill-appearing, toxic-appearing or diaphoretic.  HENT:     Head: Normocephalic and atraumatic.     Nose: Nose normal.     Mouth/Throat:     Mouth: Mucous membranes are moist.  Eyes:     General: No scleral  icterus.       Right eye: No discharge.        Left eye: No discharge.     Extraocular Movements: Extraocular movements intact.  Cardiovascular:  Rate and Rhythm: Normal rate.  Pulmonary:     Effort: Pulmonary effort is normal.  Musculoskeletal:     Right shoulder: Swelling, tenderness, bony tenderness and crepitus present. No deformity, effusion or laceration. Decreased range of motion. Decreased strength.     Right upper arm: No swelling, edema, deformity, lacerations, tenderness or bony tenderness.  Skin:    General: Skin is warm and dry.  Neurological:     General: No focal deficit present.     Mental Status: She is alert and oriented to person, place, and time.  Psychiatric:        Mood and Affect: Mood normal.        Behavior: Behavior normal.     DG Shoulder Right  Result Date: 11/05/2022 CLINICAL DATA:  Shoulder pain EXAM: RIGHT SHOULDER - 2+ VIEW COMPARISON:  None Available. FINDINGS: There is an acute comminuted intra-articular fracture of the humeral head/neck with displacement of the greater tuberosity. The humeral head is inferiorly subluxed relative to the glenoid. Acromioclavicular alignment is maintained with mild degenerative change. The soft tissues are unremarkable. IMPRESSION: Comminuted intra-articular fracture of the right humeral head/neck as above. These results will be called to the ordering clinician or representative by the Radiologist Assistant, and communication documented in the PACS or [employer]. Electronically Signed   By: Lesia Hausen M.D.   On: 11/05/2022 11:53    Patient placed into a shoulder sling immobilizer.  Assessment and Plan :   PDMP not reviewed this encounter.  1. Closed fracture of head of right humerus, initial encounter    Case discussed with Dr. Jena Gauss.  Patient is to remain in the shoulder sling immobilizer.  Recommended continued use of Tylenol.  Patient is to follow-up urgently next week.  Counseled patient on potential  for adverse effects with medications prescribed/recommended today, ER and return-to-clinic precautions discussed, patient verbalized understanding.  At discharge she did change her mind about ibuprofen and therefore we gave her an 800 mg dose.  I sent a prescription for this as well.  Recommended maintaining on omeprazole and adding famotidine.  She has normal creatinine function, no history of ckd.     Wallis Bamberg, PA-C 11/05/22 1248

## 2022-11-05 NOTE — Discharge Instructions (Addendum)
Please do not remove the shoulder sling immobilizer.  Continue taking Tylenol at a dose of 650mg  every 6 hours or 1000mg  every 8 hours depending on your pain.  Follow-up as soon as possible with Dr. Jena Gauss for your orthopedic surgeon at [facility].  If you would like to take ibuprofen 800mg  every 8 hours with food. Take your omeprazole, use famotidine 20mg  twice daily.

## 2022-11-07 ENCOUNTER — Encounter: Payer: Self-pay | Admitting: Family

## 2022-11-07 ENCOUNTER — Telehealth: Payer: Self-pay | Admitting: Podiatry

## 2022-11-07 DIAGNOSIS — S42291A Other displaced fracture of upper end of right humerus, initial encounter for closed fracture: Secondary | ICD-10-CM | POA: Diagnosis not present

## 2022-11-07 NOTE — Telephone Encounter (Signed)
PT HAD OFFICE PROCEDURE SCHEDULED FOR 11/09/22, CALLED TO RE SCHEDULE TO 01/25/23. SPOKE WITH DR. Ardelle Anton, OKAY TO MOVE PROCEDURE TO 01/25/23 @4 :00PM

## 2022-11-08 ENCOUNTER — Encounter: Payer: Self-pay | Admitting: Physical Therapy

## 2022-11-08 NOTE — Progress Notes (Signed)
COVID Vaccine received:  []  No []  Yes Date of any COVID positive Test in last 90 days:  PCP - Eustace Moore [name] Cardiologist -   Chest x-ray - 06/18/21 EPIC EKG -   Stress Test -  ECHO -  Cardiac Cath -   Bowel Prep - [x]  No  []   Yes ______  Pacemaker / ICD device [x]  No []  Yes   Spinal Cord Stimulator:[x]  No []  Yes       History of Sleep Apnea? [x]  No []  Yes   CPAP used?- [x]  No []  Yes    Does the patient monitor blood sugar?          []  No []  Yes  []  N/A  Patient has: []  NO Hx DM   []  Pre-DM                 []  DM1  [x]   DM2 Does patient have a [employer] or Dexacom? []  No []  Yes   Fasting Blood Sugar Ranges-  Checks Blood Sugar _____ times a day  GLP1 agonist / usual dose -  GLP1 instructions:  SGLT-2 inhibitors / usual dose -  SGLT-2 instructions:   Blood Thinner / Instructions: Aspirin Instructions:  Comments:   Activity level: Patient is able / unable to climb a flight of stairs without difficulty; []  No CP  []  No SOB, but would have ___   Patient can / can not perform ADLs without assistance.   Anesthesia review:   Patient denies shortness of breath, fever, cough and chest pain at PAT appointment.  Patient verbalized understanding and agreement to the Pre-Surgical Instructions that were given to them at this PAT appointment. Patient was also educated of the need to review these PAT instructions again prior to his/her surgery.I reviewed the appropriate phone numbers to call if they have any and questions or concerns.

## 2022-11-08 NOTE — Patient Instructions (Signed)
SURGICAL WAITING ROOM VISITATION  Patients having surgery or a procedure may have no more than 2 support people in the waiting area - these visitors may rotate.    Children under the age of 25 must have an adult with them who is not the patient.  Due to an increase in RSV and influenza rates and associated hospitalizations, children ages 48 and under may not visit patients in [facility] hospitals.  If the patient needs to stay at the hospital during part of their recovery, the visitor guidelines for inpatient rooms apply. Pre-op nurse will coordinate an appropriate time for 1 support person to accompany patient in pre-op.  This support person may not rotate.    Please refer to the [facility] website for the visitor guidelines for Inpatients (after your surgery is over and you are in a regular room).       Your procedure is scheduled on: 11/10/22   Report to [facility] Main Entrance    Report to admitting at 1:30 PM   Call this number if you have problems the morning of surgery [phone]   Do not eat food :After Midnight.   After Midnight you may have the following liquids until 1 PM DAY OF SURGERY  Water Non-Citrus Juices (without pulp, NO RED-Apple, White grape, White cranberry) Black Coffee (NO MILK/CREAM OR CREAMERS, sugar ok)  Clear Tea (NO MILK/CREAM OR CREAMERS, sugar ok) regular and decaf                             Plain Jell-O (NO RED)                                           Fruit ices (not with fruit pulp, NO RED)                                     Popsicles (NO RED)                                                               Sports drinks like Gatorade (NO RED)                  The day of surgery:  Drink ONE (1) Pre-Surgery  G2 at 1PM the afternoon of surgery. Drink in one sitting. Do not sip.  This drink was given to you during your hospital  pre-op appointment visit. Nothing else to drink after completing the  Pre-Surgery  G2.          If  you have questions, please contact your surgeon's office.   FOLLOW BOWEL PREP AND ANY ADDITIONAL PRE OP INSTRUCTIONS YOU RECEIVED FROM YOUR SURGEON'S OFFICE!!!     Oral Hygiene is also important to reduce your risk of infection.                                    Remember - BRUSH YOUR TEETH THE MORNING OF SURGERY WITH YOUR REGULAR TOOTHPASTE  DENTURES WILL BE REMOVED PRIOR TO SURGERY PLEASE DO NOT APPLY "Poly grip" OR ADHESIVES!!!      Take these medicines the morning of surgery: Tylenol if needed, Atorvastatin, Omeprazole  DO NOT TAKE ANY ORAL DIABETIC MEDICATIONS DAY OF YOUR SURGERY. Hold Synjardy for 72 hours.             You may not have any metal on your body including hair pins, jewelry, and body piercing             Do not wear make-up, lotions, powders, perfumes or deodorant  Do not wear nail polish including gel and S&S, artificial/acrylic nails, or any other type of covering on natural nails including finger and toenails. If you have artificial nails, gel coating, etc. that needs to be removed by a nail salon please have this removed prior to surgery or surgery may need to be canceled/ delayed if the surgeon/ anesthesia feels like they are unable to be safely monitored.   Do not shave  48 hours prior to surgery.    Do not bring valuables to the hospital. [facility] IS NOT             RESPONSIBLE   FOR VALUABLES.   Contacts, glasses, dentures or bridgework may not be worn into surgery.    DO NOT BRING YOUR HOME MEDICATIONS TO THE HOSPITAL. PHARMACY WILL DISPENSE MEDICATIONS LISTED ON YOUR MEDICATION LIST TO YOU DURING YOUR ADMISSION IN THE HOSPITAL!    Patients discharged on the day of surgery will not be allowed to drive home.  Someone NEEDS to stay with you for the first 24 hours after anesthesia.   Special Instructions: Bring a copy of your healthcare power of attorney and living will documents the day of surgery if you haven't scanned them before.               Please read over the following fact sheets you were given: IF YOU HAVE QUESTIONS ABOUT YOUR PRE-OP INSTRUCTIONS PLEASE CALL [phone]   If you received a COVID test during your pre-op visit  it is requested that you wear a mask when out in public, stay away from anyone that may not be feeling well and notify your surgeon if you develop symptoms. If you test positive for Covid or have been in contact with anyone that has tested positive in the last 10 days please notify you surgeon.      Pre-operative 5 CHG Bath Instructions   You can play a key role in reducing the risk of infection after surgery. Your skin needs to be as free of germs as possible. You can reduce the number of germs on your skin by washing with CHG (chlorhexidine gluconate) soap before surgery. CHG is an antiseptic soap that kills germs and continues to kill germs even after washing.   DO NOT use if you have an allergy to chlorhexidine/CHG or antibacterial soaps. If your skin becomes reddened or irritated, stop using the CHG and notify one of our RNs at [phone].   Please shower with the CHG soap starting 4 days before surgery using the following schedule:     Please keep in mind the following:  DO NOT shave, including legs and underarms, starting the day of your first shower.   You may shave your face at any point before/day of surgery.  Place clean sheets on your bed the day you start using CHG soap. Use a clean washcloth (not used since being washed) for each  shower. DO NOT sleep with pets once you start using the CHG.   CHG Shower Instructions:  If you choose to wash your hair and private area, wash first with your normal shampoo/soap.  After you use shampoo/soap, rinse your hair and body thoroughly to remove shampoo/soap residue.  Turn the water OFF and apply about 3 tablespoons (45 ml) of CHG soap to a CLEAN washcloth.  Apply CHG soap ONLY FROM YOUR NECK DOWN TO YOUR TOES (washing for 3-5 minutes)  DO NOT  use CHG soap on face, private areas, open wounds, or sores.  Pay special attention to the area where your surgery is being performed.  If you are having back surgery, having someone wash your back for you may be helpful. Wait 2 minutes after CHG soap is applied, then you may rinse off the CHG soap.  Pat dry with a clean towel  Put on clean clothes/pajamas   If you choose to wear lotion, please use ONLY the CHG-compatible lotions on the back of this paper.     Additional instructions for the day of surgery: DO NOT APPLY any lotions, deodorants, cologne, or perfumes.   Put on clean/comfortable clothes.  Brush your teeth.  Ask your nurse before applying any prescription medications to the skin.      CHG Compatible Lotions   Aveeno Moisturizing lotion  Cetaphil Moisturizing Cream  Cetaphil Moisturizing Lotion  Clairol Herbal Essence Moisturizing Lotion, Dry Skin  Clairol Herbal Essence Moisturizing Lotion, Extra Dry Skin  Clairol Herbal Essence Moisturizing Lotion, Normal Skin  Curel Age Defying Therapeutic Moisturizing Lotion with Alpha Hydroxy  Curel Extreme Care Body Lotion  Curel Soothing Hands Moisturizing Hand Lotion  Curel Therapeutic Moisturizing Cream, Fragrance-Free  Curel Therapeutic Moisturizing Lotion, Fragrance-Free  Curel Therapeutic Moisturizing Lotion, Original Formula  Eucerin Daily Replenishing Lotion  Eucerin Dry Skin Therapy Plus Alpha Hydroxy Crme  Eucerin Dry Skin Therapy Plus Alpha Hydroxy Lotion  Eucerin Original Crme  Eucerin Original Lotion  Eucerin Plus Crme Eucerin Plus Lotion  Eucerin TriLipid Replenishing Lotion  Keri Anti-Bacterial Hand Lotion  Keri Deep Conditioning Original Lotion Dry Skin Formula Softly Scented  Keri Deep Conditioning Original Lotion, Fragrance Free Sensitive Skin Formula  Keri Lotion Fast Absorbing Fragrance Free Sensitive Skin Formula  Keri Lotion Fast Absorbing Softly Scented Dry Skin Formula  Keri Original Lotion   Keri Skin Renewal Lotion Keri Silky Smooth Lotion  Keri Silky Smooth Sensitive Skin Lotion  Nivea Body Creamy Conditioning Oil  Nivea Body Extra Enriched Lotion  Nivea Body Original Lotion  Nivea Body Sheer Moisturizing Lotion Nivea Crme  Nivea Skin Firming Lotion  NutraDerm 30 Skin Lotion  NutraDerm Skin Lotion  NutraDerm Therapeutic Skin Cream  NutraDerm Therapeutic Skin Lotion  ProShield Protective Hand Cream      How to Manage Your Diabetes Before and After Surgery  Why is it important to control my blood sugar before and after surgery? Improving blood sugar levels before and after surgery helps healing and can limit problems. A way of improving blood sugar control is eating a healthy diet by:  Eating less sugar and carbohydrates  Increasing activity/exercise  Talking with your doctor about reaching your blood sugar goals High blood sugars (greater than 180 mg/dL) can raise your risk of infections and slow your recovery, so you will need to focus on controlling your diabetes during the weeks before surgery. Make sure that the doctor who takes care of your diabetes knows about your planned surgery including the date and location.  How do I manage my blood sugar before surgery? Check your blood sugar at least 4 times a day, starting 2 days before surgery, to make sure that the level is not too high or low. Check your blood sugar the morning of your surgery when you wake up and every 2 hours until you get to the Short Stay unit. If your blood sugar is less than 70 mg/dL, you will need to treat for low blood sugar: Do not take insulin. Treat a low blood sugar (less than 70 mg/dL) with  cup of clear juice (cranberry or apple), 4 glucose tablets, OR glucose gel. Recheck blood sugar in 15 minutes after treatment (to make sure it is greater than 70 mg/dL). If your blood sugar is not greater than 70 mg/dL on recheck, call [phone] for further instructions. Report your blood sugar to  the short stay nurse when you get to Short Stay.  If you are admitted to the hospital after surgery: Your blood sugar will be checked by the staff and you will probably be given insulin after surgery (instead of oral diabetes medicines) to make sure you have good blood sugar levels. The goal for blood sugar control after surgery is 80-180 mg/dL.   WHAT DO I DO ABOUT MY DIABETES MEDICATION?  Do not take oral diabetes medicines (pills) the morning of surgery.  DO NOT TAKE THE FOLLOWING 7 DAYS PRIOR TO SURGERY: Ozempic, Wegovy, Rybelsus (Semaglutide), Byetta (exenatide), Bydureon (exenatide ER), Victoza, Saxenda (liraglutide), or Trulicity (dulaglutide) Mounjaro (Tirzepatide) Adlyxin (Lixisenatide), Polyethylene Glycol Loxenatide.  If your CBG is greater than 220 mg/dL, you may take  of your sliding scale  (correction) dose of insulin.  [place]- Preparing for Total Shoulder Arthroplasty    Before surgery, you can play an important role. Because skin is not sterile, your skin needs to be as free of germs as possible. You can reduce the number of germs on your skin by using the following products. Benzoyl Peroxide Gel Reduces the number of germs present on the skin Applied twice a day to shoulder area starting two days before surgery    Please follow these instructions carefully:  BENZOYL PEROXIDE 5% GEL  Please do not use if you have an allergy to benzoyl peroxide.   If your skin becomes reddened/irritated stop using the benzoyl peroxide.  Starting two days before surgery, apply as follows: Apply benzoyl peroxide in the morning and at night. Apply after taking a shower. If you are not taking a shower clean entire shoulder front, back, and side along with the armpit with a clean wet washcloth.  Place a quarter-sized dollop on your shoulder and rub in thoroughly, making sure to cover the front, back, and side of your shoulder, along with the armpit.   2 days before ____ AM   ____ PM               1 day before ____ AM   ____ PM                         Do this twice a day for two days.  (Last application is the night before surgery, AFTER using the CHG soap as described below).  Do NOT apply benzoyl peroxide gel on the day of surgery.

## 2022-11-09 ENCOUNTER — Encounter (HOSPITAL_COMMUNITY): Payer: Self-pay

## 2022-11-09 ENCOUNTER — Encounter (HOSPITAL_COMMUNITY)
Admission: RE | Admit: 2022-11-09 | Discharge: 2022-11-09 | Disposition: A | Discharge status: Home / Self Care | Payer: Medicare Other | Source: Ambulatory Visit | Attending: Orthopedic Surgery | Admitting: Orthopedic Surgery

## 2022-11-09 ENCOUNTER — Ambulatory Visit: Payer: Medicare Other | Admitting: Podiatry

## 2022-11-09 ENCOUNTER — Other Ambulatory Visit: Payer: Self-pay

## 2022-11-09 DIAGNOSIS — E119 Type 2 diabetes mellitus without complications: Secondary | ICD-10-CM | POA: Insufficient documentation

## 2022-11-09 DIAGNOSIS — Z01818 Encounter for other preprocedural examination: Secondary | ICD-10-CM | POA: Insufficient documentation

## 2022-11-09 DIAGNOSIS — R9431 Abnormal electrocardiogram [ECG] [EKG]: Secondary | ICD-10-CM | POA: Diagnosis not present

## 2022-11-09 DIAGNOSIS — I10 Essential (primary) hypertension: Secondary | ICD-10-CM | POA: Insufficient documentation

## 2022-11-09 LAB — CBC
HCT: 42.8 % (ref 36.0–46.0)
Hemoglobin: 13.7 g/dL (ref 12.0–15.0)
MCH: 26.7 pg (ref 26.0–34.0)
MCHC: 32 g/dL (ref 30.0–36.0)
MCV: 83.3 fL (ref 80.0–100.0)
Platelets: 283 10*3/uL (ref 150–400)
RBC: 5.14 MIL/uL — ABNORMAL HIGH (ref 3.87–5.11)
RDW: 15.9 % — ABNORMAL HIGH (ref 11.5–15.5)
WBC: 9.3 10*3/uL (ref 4.0–10.5)
nRBC: 0 % (ref 0.0–0.2)

## 2022-11-09 LAB — BASIC METABOLIC PANEL
Anion gap: 11 (ref 5–15)
BUN: 17 mg/dL (ref 8–23)
CO2: 22 mmol/L (ref 22–32)
Calcium: 9.3 mg/dL (ref 8.9–10.3)
Chloride: 100 mmol/L (ref 98–111)
Creatinine, Ser: 0.63 mg/dL (ref 0.44–1.00)
GFR, Estimated: >60 mL/min (ref >60)
Glucose, Bld: 135 mg/dL — ABNORMAL HIGH (ref 70–99)
Potassium: 4.1 mmol/L (ref 3.5–5.1)
Sodium: 133 mmol/L — ABNORMAL LOW (ref 135–145)

## 2022-11-09 LAB — SURGICAL PCR SCREEN
MRSA, PCR: NEGATIVE
Staphylococcus aureus: NEGATIVE

## 2022-11-09 LAB — GLUCOSE, CAPILLARY: Glucose-Capillary: 169 mg/dL — ABNORMAL HIGH (ref 70–99)

## 2022-11-09 NOTE — Patient Instructions (Signed)
SURGICAL WAITING ROOM VISITATION  Patients having surgery or a procedure may have no more than 2 support people in the waiting area - these visitors may rotate.    Children under the age of 5 must have an adult with them who is not the patient.  Due to an increase in RSV and influenza rates and associated hospitalizations, children ages 42 and under may not visit patients in [facility] hospitals.  If the patient needs to stay at the hospital during part of their recovery, the visitor guidelines for inpatient rooms apply. Pre-op nurse will coordinate an appropriate time for 1 support person to accompany patient in pre-op.  This support person may not rotate.    Please refer to the [facility] website for the visitor guidelines for Inpatients (after your surgery is over and you are in a regular room).       Your procedure is scheduled on: 11/10/22   Report to [facility] Main Entrance    Report to admitting at 1:30 PM   Call this number if you have problems the morning of surgery [phone]   Do not eat food :After Midnight.   After Midnight you may have the following liquids until 1 PM DAY OF SURGERY  Water Non-Citrus Juices (without pulp, NO RED-Apple, White grape, White cranberry) Black Coffee (NO MILK/CREAM OR CREAMERS, sugar ok)  Clear Tea (NO MILK/CREAM OR CREAMERS, sugar ok) regular and decaf                             Plain Jell-O (NO RED)                                           Fruit ices (not with fruit pulp, NO RED)                                     Popsicles (NO RED)                                                               Sports drinks like Gatorade (NO RED)               The day of surgery:  Drink ONE (1) Pre-Surgery G2 at 1 PM the morning of surgery. Drink in one sitting. Do not sip.  This drink was given to you during your hospital  pre-op appointment visit. Nothing else to drink after completing the  Pre-Surgery Clear Ensure or G2.           If you have questions, please contact your surgeon's office.   FOLLOW BOWEL PREP AND ANY ADDITIONAL PRE OP INSTRUCTIONS YOU RECEIVED FROM YOUR SURGEON'S OFFICE!!!     Oral Hygiene is also important to reduce your risk of infection.                                    Remember - BRUSH YOUR TEETH THE MORNING OF SURGERY WITH YOUR REGULAR TOOTHPASTE  DENTURES WILL BE REMOVED PRIOR TO SURGERY PLEASE DO NOT APPLY "Poly grip" OR ADHESIVES!!!   Do NOT smoke after Midnight   Take these medicines the morning of surgery Tylenol if needed, Atorvastatin, Omeprazole  DO NOT TAKE ANY ORAL DIABETIC MEDICINES THE DAY OF SURGERY.             You may not have any metal on your body including hair pins, jewelry, and body piercing             Do not wear make-up, lotions, powders, perfumes/cologne, or deodorant  Do not wear nail polish including gel and S&S, artificial/acrylic nails, or any other type of covering on natural nails including finger and toenails. If you have artificial nails, gel coating, etc. that needs to be removed by a nail salon please have this removed prior to surgery or surgery may need to be canceled/ delayed if the surgeon/ anesthesia feels like they are unable to be safely monitored.   Do not shave  48 hours prior to surgery.     Do not bring valuables to the hospital. [facility] IS NOT             RESPONSIBLE   FOR VALUABLES.   Contacts, glasses, dentures or bridgework may not be worn into surgery.   DO NOT BRING YOUR HOME MEDICATIONS TO THE HOSPITAL. PHARMACY WILL DISPENSE MEDICATIONS LISTED ON YOUR MEDICATION LIST TO YOU DURING YOUR ADMISSION IN THE HOSPITAL!    Patients discharged on the day of surgery will not be allowed to drive home.  Someone NEEDS to stay with you for the first 24 hours after anesthesia.   Special Instructions: Bring a copy of your healthcare power of attorney and living will documents the day of surgery if you haven't scanned them before.               Please read over the following fact sheets you were given: IF YOU HAVE QUESTIONS ABOUT YOUR PRE-OP INSTRUCTIONS PLEASE CALL [phone]    If you received a COVID test during your pre-op visit  it is requested that you wear a mask when out in public, stay away from anyone that may not be feeling well and notify your surgeon if you develop symptoms. If you test positive for Covid or have been in contact with anyone that has tested positive in the last 10 days please notify you surgeon.      Pre-operative 5 CHG Bath Instructions   You can play a key role in reducing the risk of infection after surgery. Your skin needs to be as free of germs as possible. You can reduce the number of germs on your skin by washing with CHG (chlorhexidine gluconate) soap before surgery. CHG is an antiseptic soap that kills germs and continues to kill germs even after washing.   DO NOT use if you have an allergy to chlorhexidine/CHG or antibacterial soaps. If your skin becomes reddened or irritated, stop using the CHG and notify one of our RNs at [phone].   Please shower with the CHG soap starting 4 days before surgery using the following schedule:     Please keep in mind the following:  DO NOT shave, including legs and underarms, starting the day of your first shower.   You may shave your face at any point before/day of surgery.  Place clean sheets on your bed the day you start using CHG soap. Use a clean washcloth (not used since being washed) for each  shower. DO NOT sleep with pets once you start using the CHG.   CHG Shower Instructions:  If you choose to wash your hair and private area, wash first with your normal shampoo/soap.  After you use shampoo/soap, rinse your hair and body thoroughly to remove shampoo/soap residue.  Turn the water OFF and apply about 3 tablespoons (45 ml) of CHG soap to a CLEAN washcloth.  Apply CHG soap ONLY FROM YOUR NECK DOWN TO YOUR TOES (washing for 3-5 minutes)  DO  NOT use CHG soap on face, private areas, open wounds, or sores.  Pay special attention to the area where your surgery is being performed.  If you are having back surgery, having someone wash your back for you may be helpful. Wait 2 minutes after CHG soap is applied, then you may rinse off the CHG soap.  Pat dry with a clean towel  Put on clean clothes/pajamas   If you choose to wear lotion, please use ONLY the CHG-compatible lotions on the back of this paper.     Additional instructions for the day of surgery: DO NOT APPLY any lotions, deodorants, cologne, or perfumes.   Put on clean/comfortable clothes.  Brush your teeth.  Ask your nurse before applying any prescription medications to the skin.      CHG Compatible Lotions   Aveeno Moisturizing lotion  Cetaphil Moisturizing Cream  Cetaphil Moisturizing Lotion  Clairol Herbal Essence Moisturizing Lotion, Dry Skin  Clairol Herbal Essence Moisturizing Lotion, Extra Dry Skin  Clairol Herbal Essence Moisturizing Lotion, Normal Skin  Curel Age Defying Therapeutic Moisturizing Lotion with Alpha Hydroxy  Curel Extreme Care Body Lotion  Curel Soothing Hands Moisturizing Hand Lotion  Curel Therapeutic Moisturizing Cream, Fragrance-Free  Curel Therapeutic Moisturizing Lotion, Fragrance-Free  Curel Therapeutic Moisturizing Lotion, Original Formula  Eucerin Daily Replenishing Lotion  Eucerin Dry Skin Therapy Plus Alpha Hydroxy Crme  Eucerin Dry Skin Therapy Plus Alpha Hydroxy Lotion  Eucerin Original Crme  Eucerin Original Lotion  Eucerin Plus Crme Eucerin Plus Lotion  Eucerin TriLipid Replenishing Lotion  Keri Anti-Bacterial Hand Lotion  Keri Deep Conditioning Original Lotion Dry Skin Formula Softly Scented  Keri Deep Conditioning Original Lotion, Fragrance Free Sensitive Skin Formula  Keri Lotion Fast Absorbing Fragrance Free Sensitive Skin Formula  Keri Lotion Fast Absorbing Softly Scented Dry Skin Formula  Keri Original Lotion   Keri Skin Renewal Lotion Keri Silky Smooth Lotion  Keri Silky Smooth Sensitive Skin Lotion  Nivea Body Creamy Conditioning Oil  Nivea Body Extra Enriched Lotion  Nivea Body Original Lotion  Nivea Body Sheer Moisturizing Lotion Nivea Crme  Nivea Skin Firming Lotion  NutraDerm 30 Skin Lotion  NutraDerm Skin Lotion  NutraDerm Therapeutic Skin Cream  NutraDerm Therapeutic Skin Lotion  ProShield Protective Hand Cream  Provon moisturizing lotion Progress- Preparing for Total Shoulder Arthroplasty    Before surgery, you can play an important role. Because skin is not sterile, your skin needs to be as free of germs as possible. You can reduce the number of germs on your skin by using the following products. Benzoyl Peroxide Gel Reduces the number of germs present on the skin Applied twice a day to shoulder area starting two days before surgery    ==================================================================  Please follow these instructions carefully:  BENZOYL PEROXIDE 5% GEL  Please do not use if you have an allergy to benzoyl peroxide.   If your skin becomes reddened/irritated stop using the benzoyl peroxide.  Starting two days before surgery, apply as follows: Apply benzoyl  peroxide in the morning and at night. Apply after taking a shower. If you are not taking a shower clean entire shoulder front, back, and side along with the armpit with a clean wet washcloth.  Place a quarter-sized dollop on your shoulder and rub in thoroughly, making sure to cover the front, back, and side of your shoulder, along with the armpit.   2 days before ____ AM   ____ PM              1 day before ____ AM   ____ PM                         Do this twice a day for two days.  (Last application is the night before surgery, AFTER using the CHG soap as described below).  Do NOT apply benzoyl peroxide gel on the day of surgery.Incentive Spirometer (Watch this video at home:  [email or web address])  An incentive spirometer is a tool that can help keep your lungs clear and active. This tool measures how well you are filling your lungs with each breath. Taking long deep breaths may help reverse or decrease the chance of developing breathing (pulmonary) problems (especially infection) following: A long period of time when you are unable to move or be active. BEFORE THE PROCEDURE  If the spirometer includes an indicator to show your best effort, your nurse or respiratory therapist will set it to a desired goal. If possible, sit up straight or lean slightly forward. Try not to slouch. Hold the incentive spirometer in an upright position. INSTRUCTIONS FOR USE  Sit on the edge of your bed if possible, or sit up as far as you can in bed or on a chair. Hold the incentive spirometer in an upright position. Breathe out normally. Place the mouthpiece in your mouth and seal your lips tightly around it. Breathe in slowly and as deeply as possible, raising the piston or the ball toward the top of the column. Hold your breath for 3-5 seconds or for as long as possible. Allow the piston or ball to fall to the bottom of the column. Remove the mouthpiece from your mouth and breathe out normally. Rest for a few seconds and repeat Steps 1 through 7 at least 10 times every 1-2 hours when you are awake. Take your time and take a few normal breaths between deep breaths. The spirometer may include an indicator to show your best effort. Use the indicator as a goal to work toward during each repetition. After each set of 10 deep breaths, practice coughing to be sure your lungs are clear. If you have an incision (the cut made at the time of surgery), support your incision when coughing by placing a pillow or rolled up towels firmly against it. Once you are able to get out of bed, walk around indoors and cough well. You may stop using the incentive spirometer when instructed  by your caregiver.  RISKS AND COMPLICATIONS Take your time so you do not get dizzy or light-headed. If you are in pain, you may need to take or ask for pain medication before doing incentive spirometry. It is harder to take a deep breath if you are having pain. AFTER USE Rst and breathe slowly and easily. It can be helpful to keep track of a log of your progress. Your caregiver can provide you with a simple table to help with this. If you are using the spirometer at home, follow  these instructions: SEEK MEDICAL CARE IF:  You are having difficultly using the spirometer. You have trouble using the spirometer as often as instructed. Your pain medication is not giving enough relief while using the spirometer. You develop fever of 100.5 F (38.1 C) or higher. SEEK IMMEDIATE MEDICAL CARE IF:  You cough up bloody sputum that had not been present before. You develop fever of 102 F (38.9 C) or greater. You develop worsening pain at or near the incision site. MAKE SURE YOU:  Understand these instructions. Will watch your condition. Will get help right away if you are not doing wel

## 2022-11-09 NOTE — Progress Notes (Addendum)
COVID Vaccine received:  []  No [x]  Yes Date of any COVID positive Test in last 90 days: No PCP - Eustace Moore [name] Cardiologist - no  Chest x-ray - 06/18/21 EPIC EKG -  11/09/22 EPIC Stress Test - no ECHO - no Cardiac Cath - no  Bowel Prep - [x]  No  []   Yes ______  Pacemaker / ICD device [x]  No []  Yes   Spinal Cord Stimulator:[x]  No []  Yes       History of Sleep Apnea? [x]  No []  Yes   CPAP used?- [x]  No []  Yes    Does the patient monitor blood sugar?          []  No [x]  Yes  []  N/A  Patient has: []  NO Hx DM   []  Pre-DM                 []  DM1  [x]   DM2 Does patient have a [employer] or Dexacom? [x]  No []  Yes   Fasting Blood Sugar Ranges- 110 Checks Blood Sugar __1___ times a day  GLP1 agonist / usual dose - Ozempic last dose July 2nd GLP1 instructions:  SGLT-2 inhibitors / usual dose -  SGLT-2 instructions:   Blood Thinner / Instructions:no Aspirin Instructions:81mg  Stopped 7/9  Comments:   Activity level: Patient is able  to climb a flight of stairs without difficulty; [x]  No CP  [x]  No SOB, but would have ___   Patient ca perform ADLs without assistance.   Anesthesia review:   Patient denies shortness of breath, fever, cough and chest pain at PAT appointment.  Patient verbalized understanding and agreement to the Pre-Surgical Instructions that were given to them at this PAT appointment. Patient was also educated of the need to review these PAT instructions again prior to his/her surgery.I reviewed the appropriate phone numbers to call if they have any and questions or concerns.

## 2022-11-10 ENCOUNTER — Ambulatory Visit (HOSPITAL_COMMUNITY): Payer: Medicare Other | Admitting: Anesthesiology

## 2022-11-10 ENCOUNTER — Encounter (HOSPITAL_COMMUNITY): Payer: Self-pay | Admitting: Orthopedic Surgery

## 2022-11-10 ENCOUNTER — Ambulatory Visit (HOSPITAL_BASED_OUTPATIENT_CLINIC_OR_DEPARTMENT_OTHER): Payer: Medicare Other | Admitting: Anesthesiology

## 2022-11-10 ENCOUNTER — Other Ambulatory Visit: Payer: Self-pay

## 2022-11-10 ENCOUNTER — Encounter (HOSPITAL_COMMUNITY)
Admission: RE | Disposition: A | Discharge status: Home / Self Care | Payer: Self-pay | Source: Home / Self Care | Attending: Orthopedic Surgery

## 2022-11-10 ENCOUNTER — Ambulatory Visit (HOSPITAL_COMMUNITY)
Admission: RE | Admit: 2022-11-10 | Discharge: 2022-11-10 | Disposition: A | Discharge status: Home / Self Care | Payer: Medicare Other | Attending: Orthopedic Surgery | Admitting: Orthopedic Surgery

## 2022-11-10 DIAGNOSIS — Z79899 Other long term (current) drug therapy: Secondary | ICD-10-CM | POA: Insufficient documentation

## 2022-11-10 DIAGNOSIS — E119 Type 2 diabetes mellitus without complications: Secondary | ICD-10-CM | POA: Insufficient documentation

## 2022-11-10 DIAGNOSIS — Z01818 Encounter for other preprocedural examination: Secondary | ICD-10-CM

## 2022-11-10 DIAGNOSIS — S42241A 4-part fracture of surgical neck of right humerus, initial encounter for closed fracture: Secondary | ICD-10-CM

## 2022-11-10 DIAGNOSIS — Z87891 Personal history of nicotine dependence: Secondary | ICD-10-CM | POA: Diagnosis not present

## 2022-11-10 DIAGNOSIS — S42201A Unspecified fracture of upper end of right humerus, initial encounter for closed fracture: Secondary | ICD-10-CM | POA: Diagnosis not present

## 2022-11-10 DIAGNOSIS — W19XXXA Unspecified fall, initial encounter: Secondary | ICD-10-CM | POA: Diagnosis not present

## 2022-11-10 DIAGNOSIS — S42291A Other displaced fracture of upper end of right humerus, initial encounter for closed fracture: Secondary | ICD-10-CM | POA: Diagnosis not present

## 2022-11-10 DIAGNOSIS — I10 Essential (primary) hypertension: Secondary | ICD-10-CM | POA: Insufficient documentation

## 2022-11-10 DIAGNOSIS — G8918 Other acute postprocedural pain: Secondary | ICD-10-CM | POA: Diagnosis not present

## 2022-11-10 HISTORY — PX: REVERSE SHOULDER ARTHROPLASTY: SHX5054

## 2022-11-10 LAB — GLUCOSE, CAPILLARY
Glucose-Capillary: 152 mg/dL — ABNORMAL HIGH (ref 70–99)
Glucose-Capillary: 134 mg/dL — ABNORMAL HIGH (ref 70–99)

## 2022-11-10 SURGERY — ARTHROPLASTY, SHOULDER, TOTAL, REVERSE
Anesthesia: Regional | Site: Shoulder | Laterality: Right

## 2022-11-10 MED ORDER — STERILE WATER FOR IRRIGATION IR SOLN
Status: DC | PRN
  Administered 2022-11-10: 2000 mL

## 2022-11-10 MED ORDER — CEFAZOLIN SODIUM-DEXTROSE 2-4 GM/100ML-% IV SOLN
2.0000 g | INTRAVENOUS | Status: AC
  Administered 2022-11-10: 2 g via INTRAVENOUS
  Filled 2022-11-10: qty 100

## 2022-11-10 MED ORDER — ROCURONIUM BROMIDE 100 MG/10ML IV SOLN
INTRAVENOUS | Status: DC | PRN
  Administered 2022-11-10: 50 mg via INTRAVENOUS

## 2022-11-10 MED ORDER — FENTANYL CITRATE PF 50 MCG/ML IJ SOSY
50.0000 ug | PREFILLED_SYRINGE | INTRAMUSCULAR | Status: DC
  Administered 2022-11-10: 50 ug via INTRAVENOUS
  Filled 2022-11-10: qty 2

## 2022-11-10 MED ORDER — TRAMADOL HCL 50 MG PO TABS
ORAL_TABLET | ORAL | Status: AC
  Administered 2022-11-10: 50 mg via ORAL
  Filled 2022-11-10: qty 1

## 2022-11-10 MED ORDER — VANCOMYCIN HCL 1000 MG IV SOLR
INTRAVENOUS | Status: DC | PRN
  Administered 2022-11-10: 1000 mg via TOPICAL

## 2022-11-10 MED ORDER — PHENYLEPHRINE HCL-NACL 20-0.9 MG/250ML-% IV SOLN
INTRAVENOUS | Status: AC
  Filled 2022-11-10: qty 250

## 2022-11-10 MED ORDER — SUGAMMADEX SODIUM 200 MG/2ML IV SOLN
INTRAVENOUS | Status: DC | PRN
  Administered 2022-11-10: 200 mg via INTRAVENOUS

## 2022-11-10 MED ORDER — TRAMADOL HCL 50 MG PO TABS: 50.0000 mg | ORAL_TABLET | Freq: Four times a day (QID) | ORAL | 0 refills | Status: DC | PRN

## 2022-11-10 MED ORDER — SODIUM CHLORIDE 0.9 % IV SOLN
1000.0000 mg | INTRAVENOUS | Status: DC
Start: 2022-11-10 — End: 2022-11-10

## 2022-11-10 MED ORDER — MIDAZOLAM HCL 2 MG/2ML IJ SOLN
INTRAMUSCULAR | Status: AC
  Filled 2022-11-10: qty 2

## 2022-11-10 MED ORDER — TRANEXAMIC ACID-NACL 1000-0.7 MG/100ML-% IV SOLN
1000.0000 mg | INTRAVENOUS | Status: AC
  Administered 2022-11-10: 1000 mg via INTRAVENOUS
  Filled 2022-11-10: qty 100

## 2022-11-10 MED ORDER — ORAL CARE MOUTH RINSE: 15.0000 mL | Freq: Once | OROMUCOSAL | Status: AC

## 2022-11-10 MED ORDER — VANCOMYCIN HCL 1000 MG IV SOLR
INTRAVENOUS | Status: AC
  Filled 2022-11-10: qty 20

## 2022-11-10 MED ORDER — ACETAMINOPHEN 500 MG PO TABS
1000.0000 mg | ORAL_TABLET | Freq: Once | ORAL | Status: DC
  Filled 2022-11-10: qty 2

## 2022-11-10 MED ORDER — LIDOCAINE HCL (CARDIAC) PF 100 MG/5ML IV SOSY
PREFILLED_SYRINGE | INTRAVENOUS | Status: DC | PRN
  Administered 2022-11-10: 60 mg via INTRAVENOUS

## 2022-11-10 MED ORDER — MIDAZOLAM HCL 2 MG/2ML IJ SOLN
1.0000 mg | INTRAMUSCULAR | Status: DC
  Administered 2022-11-10: 2 mg via INTRAVENOUS
  Filled 2022-11-10: qty 2

## 2022-11-10 MED ORDER — DEXAMETHASONE SODIUM PHOSPHATE 10 MG/ML IJ SOLN
INTRAMUSCULAR | Status: DC | PRN
  Administered 2022-11-10: 8 mg via INTRAVENOUS

## 2022-11-10 MED ORDER — TRAMADOL HCL 50 MG PO TABS: 50.0000 mg | ORAL_TABLET | Freq: Once | ORAL | Status: AC

## 2022-11-10 MED ORDER — FENTANYL CITRATE PF 50 MCG/ML IJ SOSY
PREFILLED_SYRINGE | INTRAMUSCULAR | Status: AC
  Filled 2022-11-10: qty 1

## 2022-11-10 MED ORDER — 0.9 % SODIUM CHLORIDE (POUR BTL) OPTIME
TOPICAL | Status: DC | PRN
  Administered 2022-11-10: 1000 mL

## 2022-11-10 MED ORDER — ONDANSETRON HCL 4 MG/2ML IJ SOLN
INTRAMUSCULAR | Status: DC | PRN
  Administered 2022-11-10: 4 mg via INTRAVENOUS

## 2022-11-10 MED ORDER — ROCURONIUM BROMIDE 10 MG/ML (PF) SYRINGE
PREFILLED_SYRINGE | INTRAVENOUS | Status: AC
  Filled 2022-11-10: qty 10

## 2022-11-10 MED ORDER — PROPOFOL 500 MG/50ML IV EMUL
INTRAVENOUS | Status: DC | PRN
  Administered 2022-11-10: 150 ug/kg/min via INTRAVENOUS

## 2022-11-10 MED ORDER — BUPIVACAINE LIPOSOME 1.3 % IJ SUSP
INTRAMUSCULAR | Status: DC | PRN
  Administered 2022-11-10: 10 mL via PERINEURAL

## 2022-11-10 MED ORDER — PROPOFOL 10 MG/ML IV BOLUS
INTRAVENOUS | Status: AC
  Filled 2022-11-10: qty 20

## 2022-11-10 MED ORDER — PHENYLEPHRINE HCL (PRESSORS) 10 MG/ML IV SOLN
INTRAVENOUS | Status: DC | PRN
  Administered 2022-11-10: 80 ug via INTRAVENOUS

## 2022-11-10 MED ORDER — LACTATED RINGERS IV SOLN: INTRAVENOUS | Status: DC

## 2022-11-10 MED ORDER — PROPOFOL 10 MG/ML IV BOLUS
INTRAVENOUS | Status: DC | PRN
Start: 2022-11-10 — End: 2022-11-10
  Administered 2022-11-10: 200 mg via INTRAVENOUS

## 2022-11-10 MED ORDER — DEXAMETHASONE SODIUM PHOSPHATE 10 MG/ML IJ SOLN
INTRAMUSCULAR | Status: AC
  Filled 2022-11-10: qty 1

## 2022-11-10 MED ORDER — INSULIN ASPART 100 UNIT/ML IJ SOLN: 0.0000 [IU] | INTRAMUSCULAR | Status: DC | PRN

## 2022-11-10 MED ORDER — ONDANSETRON HCL 4 MG PO TABS: 4.0000 mg | ORAL_TABLET | Freq: Three times a day (TID) | ORAL | 0 refills | Status: DC | PRN

## 2022-11-10 MED ORDER — FENTANYL CITRATE PF 50 MCG/ML IJ SOSY
25.0000 ug | PREFILLED_SYRINGE | INTRAMUSCULAR | Status: DC | PRN
  Administered 2022-11-10 (×3): 50 ug via INTRAVENOUS

## 2022-11-10 MED ORDER — FENTANYL CITRATE (PF) 100 MCG/2ML IJ SOLN
INTRAMUSCULAR | Status: AC
  Filled 2022-11-10: qty 2

## 2022-11-10 MED ORDER — FENTANYL CITRATE (PF) 100 MCG/2ML IJ SOLN
INTRAMUSCULAR | Status: DC | PRN
  Administered 2022-11-10 (×2): 50 ug via INTRAVENOUS

## 2022-11-10 MED ORDER — DEXMEDETOMIDINE HCL IN NACL 80 MCG/20ML IV SOLN
INTRAVENOUS | Status: DC | PRN
  Administered 2022-11-10: 4 ug via INTRAVENOUS
  Administered 2022-11-10: 8 ug via INTRAVENOUS

## 2022-11-10 MED ORDER — MIDAZOLAM HCL 5 MG/5ML IJ SOLN
INTRAMUSCULAR | Status: DC | PRN
  Administered 2022-11-10 (×2): 1 mg via INTRAVENOUS

## 2022-11-10 MED ORDER — LIDOCAINE HCL (PF) 2 % IJ SOLN
INTRAMUSCULAR | Status: AC
  Filled 2022-11-10: qty 5

## 2022-11-10 MED ORDER — CHLORHEXIDINE GLUCONATE 0.12 % MT SOLN
15.0000 mL | Freq: Once | OROMUCOSAL | Status: AC
  Administered 2022-11-10: 15 mL via OROMUCOSAL

## 2022-11-10 MED ORDER — BUPIVACAINE HCL (PF) 0.5 % IJ SOLN
INTRAMUSCULAR | Status: DC | PRN
  Administered 2022-11-10: 15 mL via PERINEURAL

## 2022-11-10 MED ORDER — ONDANSETRON HCL 4 MG/2ML IJ SOLN
INTRAMUSCULAR | Status: AC
  Filled 2022-11-10: qty 2

## 2022-11-10 SURGICAL SUPPLY — 70 items
ADH SKN CLS APL DERMABOND .7 (GAUZE/BANDAGES/DRESSINGS) ×1
AID PSTN UNV HD RSTRNT DISP (MISCELLANEOUS) ×1
BAG COUNTER SPONGE SURGICOUNT (BAG) IMPLANT
BAG SPEC THK2 15X12 ZIP CLS (MISCELLANEOUS) ×1
BAG SPNG CNTER NS LX DISP (BAG) ×1
BAG ZIPLOCK 12X15 (MISCELLANEOUS) ×1 IMPLANT
BIT DRILL AR 3 (BIT) ×1
BIT DRILL AR 3 NS (BIT) IMPLANT
BLADE SAW SGTL 83.5X18.5 (BLADE) ×1 IMPLANT
BNDG CMPR 5X4 CHSV STRCH STRL (GAUZE/BANDAGES/DRESSINGS) ×1
BNDG COHESIVE 4X5 TAN STRL LF (GAUZE/BANDAGES/DRESSINGS) ×1 IMPLANT
BSPLAT GLND +2X24 MDLR (Joint) ×1 IMPLANT
COOLER ICEMAN CLASSIC (MISCELLANEOUS) ×1 IMPLANT
COVER BACK TABLE 60X90IN (DRAPES) ×1 IMPLANT
COVER SURGICAL LIGHT HANDLE (MISCELLANEOUS) ×1 IMPLANT
CUP SUT UNIV REVERS 36 NEUTRAL (Cup) IMPLANT
DERMABOND ADVANCED .7 DNX12 (GAUZE/BANDAGES/DRESSINGS) ×1 IMPLANT
DRAPE ORTHO SPLIT 77X108 STRL (DRAPES) ×2
DRAPE SHEET LG 3/4 BI-LAMINATE (DRAPES) ×1 IMPLANT
DRAPE SURG 17X11 SM STRL (DRAPES) ×1 IMPLANT
DRAPE SURG ORHT 6 SPLT 77X108 (DRAPES) ×2 IMPLANT
DRAPE TOP 10253 STERILE (DRAPES) ×1 IMPLANT
DRAPE U-SHAPE 47X51 STRL (DRAPES) ×1 IMPLANT
DRESSING AQUACEL AG SP 3.5X6 (GAUZE/BANDAGES/DRESSINGS) ×1 IMPLANT
DRSG AQUACEL AG ADV 3.5X 6 (GAUZE/BANDAGES/DRESSINGS) IMPLANT
DRSG AQUACEL AG ADV 3.5X10 (GAUZE/BANDAGES/DRESSINGS) IMPLANT
DRSG AQUACEL AG SP 3.5X6 (GAUZE/BANDAGES/DRESSINGS) ×1
DURAPREP 26ML APPLICATOR (WOUND CARE) ×1 IMPLANT
ELECT BLADE TIP CTD 4 INCH (ELECTRODE) ×1 IMPLANT
ELECT PENCIL ROCKER SW 15FT (MISCELLANEOUS) ×1 IMPLANT
ELECT REM PT RETURN 15FT ADLT (MISCELLANEOUS) ×1 IMPLANT
FACESHIELD WRAPAROUND (MASK) ×5 IMPLANT
FACESHIELD WRAPAROUND OR TEAM (MASK) ×5 IMPLANT
FIBERTAPE CERCLAGE TLINK SUT (SUTURE) IMPLANT
GLENOID UNI REV MOD 24 +2 LAT (Joint) IMPLANT
GLENOSPHERE 36 +4 LAT/24 (Joint) IMPLANT
GLOVE BIO SURGEON STRL SZ7.5 (GLOVE) ×1 IMPLANT
GLOVE BIO SURGEON STRL SZ8 (GLOVE) ×1 IMPLANT
GLOVE SS BIOGEL STRL SZ 7 (GLOVE) ×1 IMPLANT
GLOVE SS BIOGEL STRL SZ 7.5 (GLOVE) ×1 IMPLANT
GOWN STRL SURGICAL XL XLNG (GOWN DISPOSABLE) ×2 IMPLANT
KIT BASIN OR (CUSTOM PROCEDURE TRAY) ×1 IMPLANT
KIT TURNOVER KIT A (KITS) IMPLANT
LINER HUMERAL 36 +3MM SM (Shoulder) IMPLANT
MANIFOLD NEPTUNE II (INSTRUMENTS) ×1 IMPLANT
NDL TAPERED W/ NITINOL LOOP (MISCELLANEOUS) ×1 IMPLANT
NEEDLE TAPERED W/ NITINOL LOOP (MISCELLANEOUS) ×1 IMPLANT
NS IRRIG 1000ML POUR BTL (IV SOLUTION) ×1 IMPLANT
PACK SHOULDER (CUSTOM PROCEDURE TRAY) ×1 IMPLANT
PAD ARMBOARD 7.5X6 YLW CONV (MISCELLANEOUS) ×1 IMPLANT
PAD COLD SHLDR WRAP-ON (PAD) ×1 IMPLANT
PIN NITINOL TARGETER 2.8 (PIN) IMPLANT
PIN SET MODULAR GLENOID SYSTEM (PIN) IMPLANT
RESTRAINT HEAD UNIVERSAL NS (MISCELLANEOUS) ×1 IMPLANT
SCREW CENTRAL MOD 30MM (Screw) IMPLANT
SCREW PERI LOCK 5.5X16 (Screw) IMPLANT
SCREW PERI LOCK 5.5X36 (Screw) IMPLANT
SLING ARM FOAM STRAP LRG (SOFTGOODS) IMPLANT
SLING ARM FOAM STRAP MED (SOFTGOODS) IMPLANT
STEM CAP COATED SZ 9 REV 180 (Miscellaneous) IMPLANT
SUT MNCRL AB 3-0 PS2 18 (SUTURE) ×1 IMPLANT
SUT MON AB 2-0 CT1 36 (SUTURE) ×1 IMPLANT
SUT VIC AB 1 CT1 36 (SUTURE) ×1 IMPLANT
SUTURE TAPE 1.3 40 TPR END (SUTURE) ×2 IMPLANT
SUTURETAPE 1.3 40 TPR END (SUTURE) ×2
TOWEL OR 17X26 10 PK STRL BLUE (TOWEL DISPOSABLE) ×1 IMPLANT
TOWEL OR NON WOVEN STRL DISP B (DISPOSABLE) ×1 IMPLANT
TUBE SUCTION HIGH CAP CLEAR NV (SUCTIONS) ×1 IMPLANT
TUBING CONNECTING 10 (TUBING) ×1 IMPLANT
WATER STERILE IRR 1000ML POUR (IV SOLUTION) ×2 IMPLANT

## 2022-11-10 NOTE — Op Note (Signed)
11/10/2022  3:29 PM  PATIENT:   Veronica Galvan  67 y.o. female  PRE-OPERATIVE DIAGNOSIS:  Right displaced four-part proximal humerus fracture  POST-OPERATIVE DIAGNOSIS: Same  PROCEDURE: Right shoulder reverse arthroplasty utilizing a press-fit size 9 longstem Arthrex humeral implant with a neutral metaphysis, +3 polyethylene insert, 36/+4 glenosphere and a small/+2 baseplate  SURGEON:  Meeghan Skipper, Vania Rea M.D.  ASSISTANTS: Ralene Bathe, PA-C  Ralene Bathe, PA-C was utilized as an Geophysicist/field seismologist throughout this case, essential for help with positioning the patient, positioning extremity, tissue manipulation, implantation of the prosthesis, suture management, wound closure, and intraoperative decision-making.  ANESTHESIA:   General endotracheal and interscalene block with Exparel  EBL: 200 cc  SPECIMEN: None  Drains: None   PATIENT DISPOSITION:  PACU - hemodynamically stable.    PLAN OF CARE: Discharge to home after PACU  Brief history:  Veronica Galvan is a 67 year old female status post ground-level mechanical fall sustaining a severely displaced and comminuted right four-part proximal humerus fracture.  She is brought to the operating this time for planned reverse arthroplasty.  Preoperatively, I counseled the patient regarding treatment options and risks versus benefits thereof.  Possible surgical complications were all reviewed including potential for bleeding, infection, neurovascular injury, persistent pain, loss of motion, anesthetic complication, failure of the implant, and possible need for additional surgery. They understand and accept and agrees with our planned procedure.   Procedure in detail:  After undergoing routine preop evaluation the patient received prophylactic antibiotics and interscalene block with Exparel was established in the holding area by the anesthesia department.  Patient was subsequently placed supine on the operating table and underwent the smooth  induction of a general endotracheal anesthesia.  Placed in the beachchair position and appropriately padded and protected.  The right shoulder girdle region was sterilely prepped and draped in standard fashion.  Timeout was called.  A deltopectoral approach to the right shoulder is made an approximately 10 cm incision.  Skin flaps were elevated dissection carried deeply the deltopectoral interval was then developed proximal to distal with the vein taken laterally.  Conjoined tendon was mobilized and retracted medially.  The long head biceps tendon was then tenodesed at the upper border the pectoralis major tendon and the proximal segment was excised and the bicipital groove was then used to establish a landmark between the greater and lesser tuberosities.  The subscapularis was then separated from the lesser tuberosity and tagged with a pair of grasping suture tape sutures.  An osteotome was then used to separate the greater tuberosity fracture fragment from the humeral head and the humeral head articular segment was removed as a single piece and a number of comminuted fracture fragments were all Raposo debrided free from the soft tissue envelope.  A series of 3 grasping suture tape sutures were then placed to the bone tendon junction of the greater tuberosity.  With control of the subscapularis and the greater tuberosity we then gained exposure to the glenoid and a circumferential labral resection was then performed.  Guidepin directed into the center of the glenoid and the glenoid was then prepared with the central ball of the peripheral reamer and then completed with a drill and tap for a 30 mm lag screw.  The joint was copiously irrigated and all debris was removed final baseplate was then assembled and inserted with vancomycin powder applied to the threads of the lag screw and excellent purchase and fixation was achieved.  All of the peripheral locking screws were then placed using  standard technique with  excellent fixation.  A 36/+4 glenosphere was then impacted onto the baseplate and the central locking screw was placed.  We then returned our attention to the humeral shaft and the fracture pattern created significant comminution and bone loss through the metaphyseal region and so we opted to use a longstem implant.  Ultimately we selected a size 9 stem which gave [place] good fit through the diaphysis and this was seated to the appropriate level and a trial reduction was then performed which showed good motion stability and soft tissue balance.  This point the trial was then removed.  A final implant was assembled.  It was then seated after the canal was irrigated cleaned and dried with vancomycin powder applied into the canal and a final implant was then seated at 20 degrees of retroversion with excellent fixation.  Trial reduction showed good motion stability and soft tissue balance with a +3 poly.  A final +3 poly was then impacted onto the implant final reduction was then performed which confirmed that the greater tuberosity could be properly repositioned in relation to the implant and humeral shaft.  I should mention that we did place a fiber cerclage around the humeral metaphyseal diaphyseal junction prior to broaching and this was appropriately tensioned and then once our final implant was seated we then performed terminal tensioning of the cerclage achieving excellent fixation and this was utilized also to prevent propagation of the knee distally extending fracture lines.  At this point then we placed a pair of sutures through the bone tendon junction of both the greater and lesser tuberosities passing through the eyelets on the collar of our implant and then the previously placed suture limbs were secured to the limbs of the fiber cerclage on the humeral shaft and then secured to 1 another between the greater and lesser tuberosities and the rotator interval was closed as well with a suture tape suture allowing  [place] to create a soft tissue envelope that nicely match the native anatomy with good positioning much to our satisfaction.  This point final irrigation was completed.  Hemostasis was obtained.  The balance of the vancomycin powder was then sprayed liberally throughout the deep soft tissue planes.  Deltopectoral interval was reapproximated with a series of figure-of-eight number Vicryl sutures.  2-0 Monocryl used to close the subcu layer and intracuticular 3-0 Monocryl used to close the skin followed by Dermabond and Aquacel dressing.  The right arm was placed into a sling and the patient was awakened, extubated, and taken to the recovery room in stable condition.  Senaida Lange MD   Contact # [phone]

## 2022-11-10 NOTE — Anesthesia Procedure Notes (Signed)
Anesthesia Regional Block: Interscalene brachial plexus block   Pre-Anesthetic Checklist: , timeout performed,  Correct Patient, Correct Site, Correct Laterality,  Correct Procedure, Correct Position, site marked,  Risks and benefits discussed,  Pre-op evaluation,  At surgeon's request and post-op pain management  Laterality: Right  Prep: Maximum Sterile Barrier Precautions used, chloraprep       Needles:  Injection technique: Single-shot  Needle Type: Echogenic Stimulator Needle     Needle Length: 5cm  Needle Gauge: 21     Additional Needles:   Procedures:,,,, ultrasound used (permanent image in chart),,    Narrative:  Start time: 11/10/2022 1:30 PM End time: 11/10/2022 1:34 PM Injection made incrementally with aspirations every 5 mL. Anesthesiologist: Elmer Picker, MD

## 2022-11-10 NOTE — Anesthesia Postprocedure Evaluation (Signed)
Anesthesia Post Note  Patient: Veronica Galvan  Procedure(s) Performed: REVERSE SHOULDER ARTHROPLASTY (Right: Shoulder)     Patient location during evaluation: PACU Anesthesia Type: Regional and General Level of consciousness: awake and alert Pain management: pain level controlled Vital Signs Assessment: post-procedure vital signs reviewed and stable Respiratory status: spontaneous breathing, nonlabored ventilation, respiratory function stable and patient connected to nasal cannula oxygen Cardiovascular status: blood pressure returned to baseline and stable Postop Assessment: no apparent nausea or vomiting Anesthetic complications: no  No notable events documented.  Last Vitals:  Vitals:   11/10/22 1645 11/10/22 1720  BP: (!) 168/90 (!) 156/103  Pulse: (!) 102 (!) 104  Resp: 18 17  Temp:  37.5 C  SpO2: 97% 94%    Last Pain:  Vitals:   11/10/22 1732  TempSrc:   PainSc: 10-Worst pain ever                 Lynnley Doddridge L Giang Hemme

## 2022-11-10 NOTE — Transfer of Care (Signed)
Immediate Anesthesia Transfer of Care Note  Patient: Veronica Galvan  Procedure(s) Performed: REVERSE SHOULDER ARTHROPLASTY (Right: Shoulder)  Patient Location: PACU  Anesthesia Type:General  Level of Consciousness: awake, alert , and patient cooperative  Airway & Oxygen Therapy: Patient Spontanous Breathing and Patient connected to face mask oxygen  Post-op Assessment: Report given to RN, Post -op Vital signs reviewed and stable, and Patient moving all extremities X 4  Post vital signs: Reviewed and stable  Last Vitals:  Vitals Value Taken Time  BP 153/97 11/10/22 1548  Temp    Pulse 102 11/10/22 1551  Resp 16 11/10/22 1551  SpO2 99 % 11/10/22 1551  Vitals shown include unfiled device data.  Last Pain:  Vitals:   11/10/22 1307  TempSrc: Oral  PainSc:          Complications: No notable events documented.

## 2022-11-10 NOTE — Discharge Instructions (Signed)
Vania Rea. Supple, M.D., F.A.A.O.S. Orthopaedic Surgery Specializing in Arthroscopic and Reconstructive Surgery of the Shoulder [phone] [address] [place], [place] [address] - Fax [phone]   POST-OP TOTAL SHOULDER REPLACEMENT INSTRUCTIONS  1. Follow up in the office for your first post-op appointment 10-14 days from the date of your surgery. If you do not already have a scheduled appointment, our office will contact you to schedule.  2. The bandage over your incision is waterproof. You may begin showering with this dressing on. You may leave this dressing on until first follow up appointment within 2 weeks. We prefer you leave this dressing in place until follow up however after 5-7 days if you are having itching or skin irritation and would like to remove it you may do so. Go slow and tug at the borders gently to break the bond the dressing has with the skin. At this point if there is no drainage it is okay to go without a bandage or you may cover it with a light guaze and tape. You can also expect significant bruising around your shoulder that will drift down your arm and into your chest wall. This is very normal and should resolve over several days.   3. Wear your sling/immobilizer at all times except to perform the exercises below or to occasionally let your arm dangle by your side to stretch your elbow. You also need to sleep in your sling immobilizer until instructed otherwise. It is ok to remove your sling if you are sitting in a controlled environment and allow your arm to rest in a position of comfort by your side or on your lap with pillows to give your neck and skin a break from the sling. You may remove it to allow arm to dangle by side to shower. If you are up walking around and when you go to sleep at night you need to wear it.  4. Range of motion to your elbow, wrist, and hand are encouraged 3-5 times daily. Exercise to your hand and fingers helps to reduce  swelling you may experience.   5. Prescriptions for a pain medication and a muscle relaxant are provided for you. It is recommended that if you are experiencing pain that you pain medication alone is not controlling, add the muscle relaxant along with the pain medication which can give additional pain relief. The first 1-2 days is generally the most severe of your pain and then should gradually decrease. As your pain lessens it is recommended that you decrease your use of the pain medications to an "as needed basis'" only and to always comply with the recommended dosages of the pain medications.  6. Pain medications can produce constipation along with their use. If you experience this, the use of an over the counter stool softener or laxative daily is recommended.   7. For additional questions or concerns, please do not hesitate to call the office. If after hours there is an answering service to forward your concerns to the physician on call.  8.Pain control following an exparel block  To help control your post-operative pain you received a nerve block  performed with Exparel which is a long acting anesthetic (numbing agent) which can provide pain relief and sensations of numbness (and relief of pain) in the operative shoulder and arm for up to 3 days. Sometimes it provides mixed relief, meaning you may still have numbness in certain areas of the arm but can still be able to  move  parts of that arm, hand, and fingers. We recommend that your prescribed pain medications  be used as needed. We do not feel it is necessary to "pre medicate" and "stay ahead" of pain.  Taking narcotic pain medications when you are not having any pain can lead to unnecessary and potentially dangerous side effects.    9. Use the ice machine as much as possible in the first 5-7 days from surgery, then you can wean its use to as needed. The ice typically needs to be replaced every 6 hours, instead of ice you can actually freeze  water bottles to put in the cooler and then fill water around them to avoid having to purchase ice. You can have spare water bottles freezing to allow you to rotate them once they have melted. Try to have a thin shirt or light cloth or towel under the ice wrap to protect your skin.   FOR ADDITIONAL INFO ON ICE MACHINE AND INSTRUCTIONS GO TO THE WEBSITE AT  [email or web address]  10.  We recommend that you avoid any dental work or cleaning in the first 3 months following your joint replacement. This is to help minimize the possibility of infection from the bacteria in your mouth that enters your bloodstream during dental work. We also recommend that you take an antibiotic prior to your dental work for the first year after your shoulder replacement to further help reduce that risk. Please simply contact our office for antibiotics to be sent to your pharmacy prior to dental work.  11. Dental Antibiotics:  We recommend waiting at least 3 months for any dental work even cleanings unless there is a Actuary. We also recommend  prophylactic antibiotics for all dental procdeures  the first year following your joint replacement. In some exceptions we recommend them to be used lifelong. We will provide you with that prescription in follow up office visits, or you can call our office.  Exceptions are as follows:  1. History of prior total joint infection  2. Severely immunocompromised (Organ Transplant, cancer chemotherapy, Rheumatoid biologic meds such as Humera)  3. Poorly controlled diabetes (A1C &gt; 8.0, blood glucose over 200)   POST-OP EXERCISES  OK to allow arm to dangle to stretch elbow and for showers and hygiene

## 2022-11-10 NOTE — H&P (Signed)
Veronica Galvan    Chief Complaint: Right displaced proximal humerus fracture HPI: The patient is a 67 y.o. female status post mechanical fall sustaining a severely comminuted and displaced right four-part proximal humerus fracture.  She is brought to the operating this time for planned right shoulder reverse arthroplasty  Past Medical History:  Diagnosis Date   Allergy    Anxiety    Arthritis    hands   Cataract    Diabetes mellitus without complication (HCC)    type 2   Family history of adverse reaction to anesthesia    son has malignant hyperthermia, daughter does not daughter recently had c section without problems   GERD (gastroesophageal reflux disease)    Headache    sinus   Hyperlipidemia    Hypertension    PONV (postoperative nausea and vomiting)    nausea only   Ulcer, stomach peptic yrs ago      Past Surgical History:  Procedure Laterality Date   APPENDECTOMY     both hells bone spur repair     both heels with metal clips   both shoulder rotator cuff repair     CESAREAN SECTION     x 1   COLONOSCOPY WITH PROPOFOL N/A 09/07/2016   Procedure: COLONOSCOPY WITH PROPOFOL;  Surgeon: Charolett Bumpers, MD;  Location: [facility];  Service: Endoscopy;  Laterality: N/A;   colonscopy  06/2011   polyps   EYE SURGERY     FRACTURE SURGERY     TUBAL LIGATION     VESICO-VAGINAL FISTULA REPAIR      Family History  Problem Relation Age of Onset   Hypertension Mother    COPD Mother    Cancer Mother    Arthritis Mother    Hypertension Father    Hyperlipidemia Father    Diabetes Father    Cancer Father    Alcohol abuse Father    Diabetes Brother    Alcohol abuse Brother    Hypertension Brother    Arthritis Maternal Grandmother    Birth defects Maternal Grandmother    Diabetes Paternal Grandmother    Heart disease Paternal Grandfather    Diabetes Paternal Aunt    Diabetes Paternal Aunt    Obesity Son     Social History:  reports that she has quit smoking.  Her smoking use included cigarettes. She has a 14 pack-year smoking history. She has never used smokeless tobacco. She reports current alcohol use. She reports that she does not use drugs.  BMI: Estimated body mass index is 36.47 kg/m as calculated from the following:   Height as of 11/09/22: 5\' 1"  (1.549 m).   Weight as of 11/09/22: 87.5 kg.  Lab Results  Component Value Date   ALBUMIN 4.5 09/13/2022   Diabetes:   Patient has a diagnosis of diabetes,  Lab Results  Component Value Date   HGBA1C 7.1 (H) 09/13/2022   Smoking Status:       No medications prior to admission.     Physical Exam: Right shoulder demonstrates diffuse swelling and local tenderness.  Patient is an obvious significant discomfort and holds the arm in a guarded fashion.  She is intact to light touch sensation in the axillary nerve distribution.  She is grossly neurovascular intact distally in the right upper extremity.  Radiographs  Plain films confirm a severely impacted and displaced right four-part proximal humerus fracture.  Vitals  Temp:  [97.8 F (36.6 C)] 97.8 F (36.6 C) (07/10 0810) Pulse Rate:  [  99] 99 (07/10 0810) BP: (167)/(74) 167/74 (07/10 0810) SpO2:  [94 %] 94 % (07/10 0810) Weight:  [87.5 kg] 87.5 kg (07/10 0810)  Assessment/Plan  Impression: Right displaced proximal humerus fracture  Plan of Action: Procedure(s): REVERSE SHOULDER ARTHROPLASTY  Bresha Hosack M Marieke Lubke 11/10/2022, 5:53 AM Contact # [phone]

## 2022-11-10 NOTE — Anesthesia Procedure Notes (Signed)
Procedure Name: Intubation Date/Time: 11/10/2022 3:49 AM  Performed by: Garth Bigness, CRNAPre-anesthesia Checklist: Patient identified, Emergency Drugs available, Suction available and Patient being monitored Patient Re-evaluated:Patient Re-evaluated prior to induction Oxygen Delivery Method: Circle system utilized Preoxygenation: Pre-oxygenation with 100% oxygen Induction Type: IV induction Ventilation: Mask ventilation without difficulty Laryngoscope Size: Mac and 3 Grade View: Grade II Tube type: Oral Tube size: 7.0 mm Number of attempts: 1 Airway Equipment and Method: Stylet Placement Confirmation: ETT inserted through vocal cords under direct vision, positive ETCO2 and breath sounds checked- equal and bilateral Secured at: 22 cm Tube secured with: Tape Dental Injury: Teeth and Oropharynx as per pre-operative assessment

## 2022-11-10 NOTE — Evaluation (Signed)
Occupational Therapy Evaluation Patient Details Name: Veronica Galvan MRN: [id number] DOB: 1956-03-25 Today's Date: 11/10/2022   History of Present Illness Ms. Cumbo is a 67 yr old female who is s/p R shoulder reverse arthroplasty 11-10-22  due to R displaced proximal humerus fracture.   Clinical Impression   Pt is s/p R shoulder replacement on 11-10-22. Therapist provided education and instruction to patient and her daughter with regards to UE ROM/exercises, post-op precautions, UE positioning, donning upper extremity clothing, recommendations for bathing while maintaining shoulder precautions, correct use of ice machine, use of ice for pain and edema management, and correctly donning/doffing sling. Patient and her daughter verbalized and demonstrated understanding as needed. Patient needed assistance to donn gown, underwear, and shoes, with instruction provided on compensatory strategies to perform ADLs. Patient to follow up with MD for further therapy needs.        Recommendations for follow up therapy are one component of a multi-disciplinary discharge planning process, led by the attending physician.  Recommendations may be updated based on patient status, additional functional criteria and insurance authorization.   Assistance Recommended at Discharge Intermittent Supervision/Assistance  Patient can return home with the following Assist for transportation;Assistance with cooking/housework;Help with stairs or ramp for entrance;A little help with bathing/dressing/bathroom    Functional Status Assessment  Patient has had a recent decline in their functional status and demonstrates the ability to make significant improvements in function in a reasonable and predictable amount of time.  Equipment Recommendations  None recommended by OT    Recommendations for Other Services       Precautions / Restrictions Precautions Precautions: Shoulder Type of Shoulder Precautions: PROM 10 ER, 45  ABD,60 FE , PASSIVE ROM FOR ADL's ONLY, NOT for EXERCISES. OK to exercise elbow wrist and hand rom and for edema control   No pendulums, may allow arm to dangle   Pt may shower. Sling at all times except ADLs and exercise. Okay to perform RUE elbow, wrist, and hand AROM Shoulder Interventions: Shoulder sling/immobilizer Precaution Booklet Issued: Yes (comment) Required Braces or Orthoses: Sling Restrictions Weight Bearing Restrictions: Yes RUE Weight Bearing: Non weight bearing      Mobility Bed Mobility        General bed mobility comments: pt was received seated in the chair    Transfers Overall transfer level: Needs assistance Equipment used: None Transfers: Sit to/from Stand Sit to Stand: Supervision                  Balance Overall balance assessment: No apparent balance deficits (not formally assessed)              ADL either performed or assessed with clinical judgement                   Pertinent Vitals/Pain Pain Assessment Pain Assessment: 0-10 Pain Score: 10-Worst pain ever Pain Location: R shoulder Pain Intervention(s): Limited activity within patient's tolerance, Monitored during session, Patient requesting pain meds-RN notified     Hand Dominance Right      Communication Communication Communication: No difficulties   Cognition Arousal/Alertness: Awake/alert Behavior During Therapy: WFL for tasks assessed/performed Overall Cognitive Status: Within Functional Limits for tasks assessed          General Comments: Oriented x4, able to follow commands without difficulty           Shoulder Instructions Shoulder Instructions Donning/doffing shirt without moving shoulder: Caregiver independent with task Method for sponge bathing under operated UE: Caregiver independent  with task Donning/doffing sling/immobilizer: Minimal assistance (with caregiver performing) Correct positioning of sling/immobilizer: Minimal assistance (with caregiver  performing) Pendulum exercises (written home exercise program):  (N/A) ROM for elbow, wrist and digits of operated UE: Caregiver independent with task Sling wearing schedule (on at all times/off for ADL's): Caregiver independent with task Proper positioning of operated UE when showering: Caregiver independent with task Dressing change:  (N/A) Positioning of UE while sleeping: Caregiver independent with task    Home Living Family/patient expects to be discharged to:: Private residence Living Arrangements: Alone. Her daughter will be staying with her temporarily.  Available Help at Discharge: Family Type of Home: House Home Access: Stairs to enter Secretary/administrator of Steps: 3 Entrance Stairs-Rails: Left;Right Home Layout: One level     Bathroom Shower/Tub: Runner, broadcasting/film/video: Shower seat - built in          Prior Functioning/Environment Prior Level of Function : Independent/Modified Independent;Driving             Mobility Comments: She was independent with ambulation. ADLs Comments: She was independent with ADLs, cooking, cleaning, and driving.        OT Problem List: Impaired UE functional use      OT Treatment/Interventions:   No further OT treatment needs in the acute care setting      OT Frequency:  N/A       AM-PAC OT "6 Clicks" Daily Activity     Outcome Measure Help from another person eating meals?: None Help from another person taking care of personal grooming?: None Help from another person toileting, which includes using toliet, bedpan, or urinal?: A Little Help from another person bathing (including washing, rinsing, drying)?: A Little Help from another person to put on and taking off regular upper body clothing?: A Little Help from another person to put on and taking off regular lower body clothing?: A Little 6 Click Score: 20   End of Session Nurse Communication: Other (comment) (Shoulder education  completed)  Activity Tolerance: Patient tolerated treatment well Patient left: in chair;with call bell/phone within reach;with nursing/sitter in room;with family/visitor present  OT Visit Diagnosis: Muscle weakness (generalized) (M62.81);Pain Pain - Right/Left: Right Pain - part of body: Shoulder                Time: 1720-1745 OT Time Calculation (min): 25 min Charges:  OT General Charges $OT Visit: 1 Visit OT Evaluation $OT Eval Moderate Complexity: 1 Mod OT Treatments $Self Care/Home Management : 8-22 mins    Reuben Likes, OTR/L 11/10/2022, 6:03 PM

## 2022-11-10 NOTE — Anesthesia Preprocedure Evaluation (Addendum)
Anesthesia Evaluation  Patient identified by MRN, date of birth, ID band Patient awake    Reviewed: Allergy & Precautions, NPO status , Patient's Chart, lab work & pertinent test results  History of Anesthesia Complications (+) PONV and history of anesthetic complications (son has MH)  Airway Mallampati: III  TM Distance: >3 FB Neck ROM: Full    Dental no notable dental hx. (+) Teeth Intact, Dental Advisory Given   Pulmonary former smoker   Pulmonary exam normal breath sounds clear to auscultation       Cardiovascular hypertension, Pt. on medications Normal cardiovascular exam Rhythm:Regular Rate:Normal     Neuro/Psych  PSYCHIATRIC DISORDERS Anxiety     negative neurological ROS     GI/Hepatic Neg liver ROS, PUD,GERD  ,,  Endo/Other  diabetes, Type 2    Renal/GU negative Renal ROS  negative genitourinary   Musculoskeletal negative musculoskeletal ROS (+)    Abdominal   Peds  Hematology negative hematology ROS (+)   Anesthesia Other Findings   Reproductive/Obstetrics                             Anesthesia Physical Anesthesia Plan  ASA: 2  Anesthesia Plan: General and Regional   Post-op Pain Management: Regional block* and Tylenol PO (pre-op)*   Induction: Intravenous  PONV Risk Score and Plan: 4 or greater and Midazolam, Dexamethasone, Ondansetron and TIVA  Airway Management Planned: Oral ETT  Additional Equipment:   Intra-op Plan:   Post-operative Plan: Extubation in OR  Informed Consent: I have reviewed the patients History and Physical, chart, labs and discussed the procedure including the risks, benefits and alternatives for the proposed anesthesia with the patient or authorized representative who has indicated his/her understanding and acceptance.     Dental advisory given  Plan Discussed with: CRNA  Anesthesia Plan Comments: (MH precautions)        Anesthesia Quick Evaluation

## 2022-11-11 ENCOUNTER — Other Ambulatory Visit: Payer: Self-pay | Admitting: Family

## 2022-11-11 ENCOUNTER — Telehealth: Payer: Self-pay | Admitting: Family

## 2022-11-11 ENCOUNTER — Encounter (HOSPITAL_COMMUNITY): Payer: Self-pay | Admitting: Orthopedic Surgery

## 2022-11-11 MED ORDER — SYNJARDY XR 12.5-1000 MG PO TB24: 1.0000 | ORAL_TABLET | Freq: Two times a day (BID) | ORAL | 3 refills | Status: DC

## 2022-11-11 NOTE — Telephone Encounter (Signed)
Prescription Request  *see mychart message 11/07/22*  11/11/2022  Is this a "Controlled Substance" medicine? No  LOV: 09/13/2022  What is the name of the medication or equipment?  SYNJARDY XR 12.08-998 MG TB24 [[id number]]   Have you contacted your pharmacy to request a refill? Yes   Which pharmacy would you like this sent to?  [facility]   Patient notified that their request is being sent to the clinical staff for review and that they should receive a response within 2 business days.   Please advise at Mobile [phone] (mobile)

## 2022-11-14 ENCOUNTER — Encounter: Payer: Medicare Other | Admitting: Podiatry

## 2022-11-21 DIAGNOSIS — Z96611 Presence of right artificial shoulder joint: Secondary | ICD-10-CM | POA: Diagnosis not present

## 2022-11-22 DIAGNOSIS — L814 Other melanin hyperpigmentation: Secondary | ICD-10-CM | POA: Diagnosis not present

## 2022-11-22 DIAGNOSIS — C44712 Basal cell carcinoma of skin of right lower limb, including hip: Secondary | ICD-10-CM | POA: Diagnosis not present

## 2022-11-22 DIAGNOSIS — D225 Melanocytic nevi of trunk: Secondary | ICD-10-CM | POA: Diagnosis not present

## 2022-11-22 DIAGNOSIS — L820 Inflamed seborrheic keratosis: Secondary | ICD-10-CM | POA: Diagnosis not present

## 2022-11-22 DIAGNOSIS — D485 Neoplasm of uncertain behavior of skin: Secondary | ICD-10-CM | POA: Diagnosis not present

## 2022-11-22 DIAGNOSIS — L570 Actinic keratosis: Secondary | ICD-10-CM | POA: Diagnosis not present

## 2022-11-22 DIAGNOSIS — L578 Other skin changes due to chronic exposure to nonionizing radiation: Secondary | ICD-10-CM | POA: Diagnosis not present

## 2022-11-22 DIAGNOSIS — L821 Other seborrheic keratosis: Secondary | ICD-10-CM | POA: Diagnosis not present

## 2022-11-24 ENCOUNTER — Encounter: Payer: Medicare Other | Admitting: Podiatry

## 2022-12-08 ENCOUNTER — Encounter: Payer: Medicare Other | Admitting: Podiatry

## 2022-12-09 ENCOUNTER — Other Ambulatory Visit: Payer: Self-pay | Admitting: Family

## 2022-12-15 ENCOUNTER — Encounter: Payer: Self-pay | Admitting: Family

## 2022-12-15 ENCOUNTER — Other Ambulatory Visit: Payer: Self-pay | Admitting: Family

## 2022-12-15 ENCOUNTER — Encounter (INDEPENDENT_AMBULATORY_CARE_PROVIDER_SITE_OTHER): Payer: Self-pay

## 2022-12-15 MED ORDER — SEMAGLUTIDE (2 MG/DOSE) 8 MG/3ML ~~LOC~~ SOPN: 2.0000 mg | PEN_INJECTOR | SUBCUTANEOUS | 0 refills | Status: DC

## 2022-12-19 DIAGNOSIS — Z96611 Presence of right artificial shoulder joint: Secondary | ICD-10-CM | POA: Diagnosis not present

## 2022-12-19 NOTE — Therapy (Signed)
OUTPATIENT PHYSICAL THERAPY SHOULDER EVALUATION   Patient Name: Veronica Galvan MRN: [id number] DOB:06-11-1955, 67 y.o., female Today's Date: 12/20/2022  END OF SESSION:  PT End of Session - 12/20/22 0801     Visit Number 1    Date for PT Re-Evaluation 03/22/23    Authorization Type [employer] [place]    PT Start Time 0800    PT Stop Time 0845    PT Time Calculation (min) 45 min    Activity Tolerance Patient tolerated treatment well    Behavior During Therapy WFL for tasks assessed/performed             Past Medical History:  Diagnosis Date   Allergy    Anxiety    Arthritis    hands   Cataract    Diabetes mellitus without complication (HCC)    type 2   Family history of adverse reaction to anesthesia    son has malignant hyperthermia, daughter does not daughter recently had c section without problems   GERD (gastroesophageal reflux disease)    Headache    sinus   Hyperlipidemia    Hypertension    PONV (postoperative nausea and vomiting)    nausea only   Ulcer, stomach peptic yrs ago   Past Surgical History:  Procedure Laterality Date   APPENDECTOMY     both hells bone spur repair     both heels with metal clips   both shoulder rotator cuff repair     CESAREAN SECTION     x 1   COLONOSCOPY WITH PROPOFOL N/A 09/07/2016   Procedure: COLONOSCOPY WITH PROPOFOL;  Surgeon: Charolett Bumpers, MD;  Location: [facility];  Service: Endoscopy;  Laterality: N/A;   colonscopy  06/2011   polyps   EYE SURGERY     FRACTURE SURGERY     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/10/2022   Procedure: REVERSE SHOULDER ARTHROPLASTY;  Surgeon: Francena Hanly, MD;  Location: [facility];  Service: Orthopedics;  Laterality: Right;  Please follow in room 6 if able   TUBAL LIGATION     VESICO-VAGINAL FISTULA REPAIR     Patient Active Problem List   Diagnosis Date Noted   Gastroesophageal reflux disease 11/16/2021   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 08/18/2021    Dermatofibroma 08/18/2021   History of malignant neoplasm of skin 08/18/2021   Lentigo 08/18/2021   Melanocytic nevi of trunk 08/18/2021   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   Rosacea 08/18/2021   Actinic keratosis 08/18/2021   Sensorineural hearing loss (SNHL) of left ear with unrestricted hearing of right ear 07/26/2021   Tinnitus of left ear 07/26/2021   Acute recurrent maxillary sinusitis 06/22/2021   Primary hypertension 01/12/2021   Hyperlipidemia associated with type 2 diabetes mellitus (HCC) 01/12/2021   Arthritis 01/12/2021   Type II diabetes mellitus (HCC) 11/25/2019   Ingrown toenail 09/05/2018   Neck pain 01/29/2018   Tick bite 10/24/2017    PCP: Dayton Scrape, [name]  REFERRING PROVIDER: Rennis Chris, MD  REFERRING DIAG: s/p right reverse TSA  THERAPY DIAG:  Acute pain of right shoulder  Stiffness of right shoulder, not elsewhere classified  Localized edema  Rationale for Evaluation and Treatment: Rehabilitation  ONSET DATE: 11/05/22  SUBJECTIVE:  SUBJECTIVE STATEMENT: Patient had a fall on her deck, tripped on a board that was sticking up and had a severe fracture of the right proximal humerus, 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   Cleared to be out of the sling yesterday.   Hand dominance: Right  PERTINENT HISTORY: See above  PAIN:  Are you having pain? Yes: NPRS scale: 5/10 Pain location: right shoulder Pain description: dull ache at rest, sharp with motions Aggravating factors: quick motions, movements pain up to 10/10 Relieving factors: ice, rest, Tylenol at best 3/10  PRECAUTIONS: Shoulder Protocol in the chart  RED FLAGS: None   WEIGHT BEARING RESTRICTIONS: No  FALLS:  Has patient fallen in last 6 months? Yes. Number of falls  1  LIVING ENVIRONMENT: Lives with: lives alone Lives in: House/apartment Stairs: No Has following equipment at home: None  OCCUPATION: retired  PLOF: Independent  PATIENT GOALS:dress without difficulty, do hair, have good ROM and less pain  NEXT MD VISIT:   OBJECTIVE:   DIAGNOSTIC FINDINGS:  See above  PATIENT SURVEYS:  FOTO 21  COGNITION: Overall cognitive status: Within functional limits for tasks assessed     SENSATION: WFL  POSTURE: Fwd head, rounded shoulders, elevated and guarded shoulder  UPPER EXTREMITY ROM:   Active ROM Right PROM eval Right  Shoulder flexion 35   Shoulder extension 5   Shoulder abduction 20   Shoulder adduction    Shoulder internal rotation 15   Shoulder external rotation 20   Elbow flexion 120   Elbow extension 5   Wrist flexion    Wrist extension    Wrist ulnar deviation    Wrist radial deviation    Wrist pronation    Wrist supination    (Blank rows = not tested)  UPPER EXTREMITY MMT:  No tested due to recent surgery  MMT Right eval Left eval  Shoulder flexion    Shoulder extension    Shoulder abduction    Shoulder adduction    Shoulder internal rotation    Shoulder external rotation    Middle trapezius    Lower trapezius    Elbow flexion    Elbow extension    Wrist flexion    Wrist extension    Wrist ulnar deviation    Wrist radial deviation    Wrist pronation    Wrist supination    Grip strength (lbs)    (Blank rows = not tested)  PALPATION:  Very tight and tender in the pectoral, upper trap, the entire right upper arm   TODAY'S TREATMENT:                                                                                                                                         DATE:  12/20/22 Weight 189# Vaso medium pressure 34 degree F right shoulder   PATIENT EDUCATION: Education details: poc/hep Person educated: Patient Education  method: Explanation, Demonstration, Tactile cues, Verbal cues, and  Handouts Education comprehension: verbalized understanding  HOME EXERCISE PROGRAM: Access Code: [email or web address] URL: [email or web address] Date: 12/20/2022 Prepared by: Stacie Glaze  Exercises - Seated Shoulder Flexion Towel Slide at Table Top  - 2 x daily - 7 x weekly - 2 sets - 10 reps - 3 hold - Seated Shoulder External Rotation PROM on Table  - 2 x daily - 7 x weekly - 2 sets - 10 reps - 3 hold - Seated Elbow Extension and Shoulder External Rotation AAROM at Table with Towel  - 2 x daily - 7 x weekly - 2 sets - 10 reps - 3 hold - Circular Shoulder Pendulum with Table Support  - 2 x daily - 7 x weekly - 2 sets - 10 reps - 3 hold  ASSESSMENT:  CLINICAL IMPRESSION: Patient is a 67 y.o. female who was seen today for physical therapy evaluation and treatment for right reverse shoulder TSA.  She is very tight and very guarded, she has a lot of difficulty relaxing.  She is on target for her current ROM with the protocol.   Has a lot of difficulty dressing and doing hair  OBJECTIVE IMPAIRMENTS: cardiopulmonary status limiting activity, decreased activity tolerance, decreased endurance, decreased ROM, decreased strength, increased edema, increased muscle spasms, impaired flexibility, impaired UE functional use, improper body mechanics, postural dysfunction, and pain .  REHAB POTENTIAL: Good  CLINICAL DECISION MAKING: Evolving/moderate complexity  EVALUATION COMPLEXITY: Low   GOALS: Goals reviewed with patient? Yes  SHORT TERM GOALS: Target date: 01/01/23  Independent with initial HEP Goal status: INITIAL  LONG TERM GOALS: Target date: 03/22/23  Decrease pain 50% Goal status: INITIAL  2.  Dress without difficulty Goal status: INITIAL  3.  Do hair without difficulty Goal status: INITIAL  4.  Increase AROM right shoulder flexion to 130 degrees Goal status: INITIAL  5.  Increase right shoulder ER to 60 degrees Goal status: INITIAL  6.  Return to water aerobics and  or gym activity Goal status: INITIAL  PLAN:  PT FREQUENCY: 1-2x/week  PT DURATION: 12 weeks  PLANNED INTERVENTIONS: Therapeutic exercises, Therapeutic activity, Neuromuscular re-education, Balance training, Gait training, Patient/Family education, Self Care, Joint mobilization, Dry Needling, Electrical stimulation, Cryotherapy, Vasopneumatic device, and Manual therapy  PLAN FOR NEXT SESSION: Start protocol which is in the chart surgery was 11/10/22, NO BEHIND the BACK motions for 8 weeks   Ireanna Finlayson W, PT 12/20/2022, 8:02 AM

## 2022-12-20 ENCOUNTER — Ambulatory Visit: Payer: Medicare Other | Attending: Orthopedic Surgery | Admitting: Physical Therapy

## 2022-12-20 ENCOUNTER — Encounter: Payer: Self-pay | Admitting: Physical Therapy

## 2022-12-20 DIAGNOSIS — M25611 Stiffness of right shoulder, not elsewhere classified: Secondary | ICD-10-CM | POA: Insufficient documentation

## 2022-12-20 DIAGNOSIS — M25511 Pain in right shoulder: Secondary | ICD-10-CM | POA: Insufficient documentation

## 2022-12-20 DIAGNOSIS — R600 Localized edema: Secondary | ICD-10-CM | POA: Diagnosis not present

## 2022-12-20 DIAGNOSIS — M542 Cervicalgia: Secondary | ICD-10-CM | POA: Insufficient documentation

## 2022-12-22 ENCOUNTER — Encounter: Payer: Self-pay | Admitting: Physical Therapy

## 2022-12-22 ENCOUNTER — Encounter: Payer: Self-pay | Admitting: Family

## 2022-12-22 ENCOUNTER — Ambulatory Visit: Payer: Medicare Other | Admitting: Physical Therapy

## 2022-12-22 DIAGNOSIS — M25611 Stiffness of right shoulder, not elsewhere classified: Secondary | ICD-10-CM | POA: Diagnosis not present

## 2022-12-22 DIAGNOSIS — R600 Localized edema: Secondary | ICD-10-CM | POA: Diagnosis not present

## 2022-12-22 DIAGNOSIS — M542 Cervicalgia: Secondary | ICD-10-CM

## 2022-12-22 DIAGNOSIS — M25511 Pain in right shoulder: Secondary | ICD-10-CM | POA: Diagnosis not present

## 2022-12-22 NOTE — Therapy (Signed)
OUTPATIENT PHYSICAL THERAPY SHOULDER EVALUATION   Patient Name: Veronica Galvan MRN: [id number] DOB:10-12-1955, 67 y.o., female Today's Date: 12/22/2022  END OF SESSION:  PT End of Session - 12/22/22 0836     Visit Number 2    Date for PT Re-Evaluation 03/22/23    Authorization Type [employer] [place]    PT Start Time [phone]    PT Stop Time 0850    PT Time Calculation (min) 53 min    Activity Tolerance Patient tolerated treatment well    Behavior During Therapy [facility] for tasks assessed/performed             Past Medical History:  Diagnosis Date   Allergy    Anxiety    Arthritis    hands   Cataract    Diabetes mellitus without complication (HCC)    type 2   Family history of adverse reaction to anesthesia    son has malignant hyperthermia, daughter does not daughter recently had c section without problems   GERD (gastroesophageal reflux disease)    Headache    sinus   Hyperlipidemia    Hypertension    PONV (postoperative nausea and vomiting)    nausea only   Ulcer, stomach peptic yrs ago   Past Surgical History:  Procedure Laterality Date   APPENDECTOMY     both hells bone spur repair     both heels with metal clips   both shoulder rotator cuff repair     CESAREAN SECTION     x 1   COLONOSCOPY WITH PROPOFOL N/A 09/07/2016   Procedure: COLONOSCOPY WITH PROPOFOL;  Surgeon: Charolett Bumpers, MD;  Location: [facility];  Service: Endoscopy;  Laterality: N/A;   colonscopy  06/2011   polyps   EYE SURGERY     FRACTURE SURGERY     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/10/2022   Procedure: REVERSE SHOULDER ARTHROPLASTY;  Surgeon: Francena Hanly, MD;  Location: [facility];  Service: Orthopedics;  Laterality: Right;  Please follow in room 6 if able   TUBAL LIGATION     VESICO-VAGINAL FISTULA REPAIR     Patient Active Problem List   Diagnosis Date Noted   Gastroesophageal reflux disease 11/16/2021   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 08/18/2021    Dermatofibroma 08/18/2021   History of malignant neoplasm of skin 08/18/2021   Lentigo 08/18/2021   Melanocytic nevi of trunk 08/18/2021   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   Rosacea 08/18/2021   Actinic keratosis 08/18/2021   Sensorineural hearing loss (SNHL) of left ear with unrestricted hearing of right ear 07/26/2021   Tinnitus of left ear 07/26/2021   Acute recurrent maxillary sinusitis 06/22/2021   Primary hypertension 01/12/2021   Hyperlipidemia associated with type 2 diabetes mellitus (HCC) 01/12/2021   Arthritis 01/12/2021   Type II diabetes mellitus (HCC) 11/25/2019   Ingrown toenail 09/05/2018   Neck pain 01/29/2018   Tick bite 10/24/2017    PCP: [place] Scrape, [name]  REFERRING PROVIDER: Rennis Chris, MD  REFERRING DIAG: s/p right reverse TSA  THERAPY DIAG:  Acute pain of right shoulder  Stiffness of right shoulder, not elsewhere classified  Localized edema  Cervicalgia  Rationale for Evaluation and Treatment: Rehabilitation  ONSET DATE: 11/05/22  SUBJECTIVE:  SUBJECTIVE STATEMENT: Patient had a fall on her deck, tripped on a board that was sticking up and had a severe fracture of the right proximal humerus, 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 Reports that she was very sore after the evaluation Hand dominance: Right  PERTINENT HISTORY: See above  PAIN:  Are you having pain? Yes: NPRS scale: 5/10 Pain location: right shoulder Pain description: dull ache at rest, sharp with motions Aggravating factors: quick motions, movements pain up to 10/10 Relieving factors: ice, rest, Tylenol at best 3/10  PRECAUTIONS: Shoulder Protocol in the chart  RED FLAGS: None   WEIGHT BEARING RESTRICTIONS: No  FALLS:  Has patient fallen in last 6 months?  Yes. Number of falls 1  LIVING ENVIRONMENT: Lives with: lives alone Lives in: House/apartment Stairs: No Has following equipment at home: None  OCCUPATION: retired  PLOF: Independent  PATIENT GOALS:dress without difficulty, do hair, have good ROM and less pain  NEXT MD VISIT:   OBJECTIVE:   DIAGNOSTIC FINDINGS:  See above  PATIENT SURVEYS:  FOTO 21  COGNITION: Overall cognitive status: Within functional limits for tasks assessed     SENSATION: WFL  POSTURE: Fwd head, rounded shoulders, elevated and guarded shoulder  UPPER EXTREMITY ROM:   Active ROM Right PROM eval Right  Shoulder flexion 35   Shoulder extension 5   Shoulder abduction 20   Shoulder adduction    Shoulder internal rotation 15   Shoulder external rotation 20   Elbow flexion 120   Elbow extension 5   Wrist flexion    Wrist extension    Wrist ulnar deviation    Wrist radial deviation    Wrist pronation    Wrist supination    (Blank rows = not tested)  UPPER EXTREMITY MMT:  No tested due to recent surgery  MMT Right eval Left eval  Shoulder flexion    Shoulder extension    Shoulder abduction    Shoulder adduction    Shoulder internal rotation    Shoulder external rotation    Middle trapezius    Lower trapezius    Elbow flexion    Elbow extension    Wrist flexion    Wrist extension    Wrist ulnar deviation    Wrist radial deviation    Wrist pronation    Wrist supination    Grip strength (lbs)    (Blank rows = not tested)  PALPATION:  Very tight and tender in the pectoral, upper trap, the entire right upper arm   TODAY'S TREATMENT:                                                                                                                                         DATE:  12/22/22 Gentle joint distraction and oscillation to help relax supine PROM to within protocol and pain tolerance STM to the right upper trap, neck and  right upper arm, some scar mobilization Supine chest  press AAROM Supine isometric circles Gentle isometrics circles Vaso medium pressure 34 degrees F  12/20/22 Weight 189# Vaso medium pressure 34 degree F right shoulder   PATIENT EDUCATION: Education details: poc/hep Person educated: Patient Education method: Programmer, multimedia, Facilities manager, Actor cues, Verbal cues, and Handouts Education comprehension: verbalized understanding  HOME EXERCISE PROGRAM: Access Code: [email or web address] URL: [email or web address] Date: 12/20/2022 Prepared by: Stacie Glaze  Exercises - Seated Shoulder Flexion Towel Slide at Table Top  - 2 x daily - 7 x weekly - 2 sets - 10 reps - 3 hold - Seated Shoulder External Rotation PROM on Table  - 2 x daily - 7 x weekly - 2 sets - 10 reps - 3 hold - Seated Elbow Extension and Shoulder External Rotation AAROM at Table with Towel  - 2 x daily - 7 x weekly - 2 sets - 10 reps - 3 hold - Circular Shoulder Pendulum with Table Support  - 2 x daily - 7 x weekly - 2 sets - 10 reps - 3 hold  ASSESSMENT:  CLINICAL IMPRESSION: Patient is a 67 y.o. female who was seen today for physical therapy evaluation and treatment for right reverse shoulder TSA.  Started protocol with PROM to pain tolerance, started gentle isometrics with her in supine, did very well, needs a lot of cues to relax  OBJECTIVE IMPAIRMENTS: cardiopulmonary status limiting activity, decreased activity tolerance, decreased endurance, decreased ROM, decreased strength, increased edema, increased muscle spasms, impaired flexibility, impaired UE functional use, improper body mechanics, postural dysfunction, and pain .  REHAB POTENTIAL: Good  CLINICAL DECISION MAKING: Evolving/moderate complexity  EVALUATION COMPLEXITY: Low   GOALS: Goals reviewed with patient? Yes  SHORT TERM GOALS: Target date: 01/01/23  Independent with initial HEP Goal status: INITIAL  LONG TERM GOALS: Target date: 03/22/23  Decrease pain 50% Goal status: INITIAL  2.  Dress  without difficulty Goal status: INITIAL  3.  Do hair without difficulty Goal status: INITIAL  4.  Increase AROM right shoulder flexion to 130 degrees Goal status: INITIAL  5.  Increase right shoulder ER to 60 degrees Goal status: INITIAL  6.  Return to water aerobics and or gym activity Goal status: INITIAL  PLAN:  PT FREQUENCY: 1-2x/week  PT DURATION: 12 weeks  PLANNED INTERVENTIONS: Therapeutic exercises, Therapeutic activity, Neuromuscular re-education, Balance training, Gait training, Patient/Family education, Self Care, Joint mobilization, Dry Needling, Electrical stimulation, Cryotherapy, Vasopneumatic device, and Manual therapy  PLAN FOR NEXT SESSION: Start protocol which is in the chart surgery was 11/10/22, NO BEHIND the BACK motions for 8 weeks   Yashica Sterbenz W, PT 12/22/2022, 8:38 AM

## 2022-12-23 ENCOUNTER — Encounter: Payer: Self-pay | Admitting: Family

## 2022-12-23 ENCOUNTER — Ambulatory Visit: Payer: Medicare Other | Admitting: Family

## 2022-12-23 VITALS — BP 136/72 | HR 97 | Temp 98.2°F | Resp 18 | Ht 61.0 in | Wt 187.2 lb

## 2022-12-23 DIAGNOSIS — J029 Acute pharyngitis, unspecified: Secondary | ICD-10-CM

## 2022-12-23 LAB — POCT RAPID STREP A (OFFICE): Rapid Strep A Screen: NEGATIVE

## 2022-12-23 MED ORDER — NYSTATIN-TRIAMCINOLONE 100000-0.1 UNIT/GM-% EX CREA: 1.0000 | TOPICAL_CREAM | Freq: Two times a day (BID) | CUTANEOUS | 0 refills | Status: DC

## 2022-12-23 MED ORDER — AZITHROMYCIN 250 MG PO TABS: ORAL_TABLET | ORAL | 0 refills | Status: DC

## 2022-12-23 NOTE — Telephone Encounter (Signed)
Spoke with pt, pt states she will come in at 11:30.

## 2022-12-23 NOTE — Progress Notes (Signed)
Veronica Galvan is a 67 y.o. 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   Rosacea 08/18/2021   Actinic keratosis 08/18/2021   Sensorineural hearing loss (SNHL) of left ear with unrestricted hearing of right ear 07/26/2021   Tinnitus of left ear 07/26/2021   Acute recurrent maxillary sinusitis 06/22/2021   Primary hypertension 01/12/2021   Hyperlipidemia associated with type 2 diabetes mellitus (HCC) 01/12/2021   Arthritis 01/12/2021   Type II diabetes mellitus (HCC) 11/25/2019   Ingrown toenail 09/05/2018   Neck pain 01/29/2018   Tick bite 10/24/2017    Current Outpatient Medications  Medication Sig Dispense Refill   azithromycin (ZITHROMAX Z-PAK) 250 MG tablet Take 2 tablet today as directed; then take 1 tablet po every day x 4 days; 6 each 0   nystatin-triamcinolone (MYCOLOG II) cream Apply 1 Application topically 2 (two) times daily. 60 g 0   acetaminophen (TYLENOL) 650 MG CR tablet Take 1,300 mg by mouth every 8 (eight) hours as needed for pain.     ALPRAZolam (XANAX) 0.5 MG tablet Take 1 tablet (0.5 mg total) by mouth at bedtime as needed for anxiety. 30 tablet 0   Ascorbic Acid (VITAMIN C) 1000 MG tablet Take 1,000 mg by mouth every morning.     aspirin EC 81 MG tablet Take 81 mg by mouth every morning.     atorvastatin (LIPITOR) 40 MG tablet Take 1 tablet (40 mg total) by mouth every evening. 90 tablet 3   Calcium Carb-Cholecalciferol (CALCIUM 600+D3 PO) Take 1 tablet by mouth every morning.     calcium carbonate (TUMS - DOSED IN MG ELEMENTAL CALCIUM) 500 MG chewable tablet Chew 2 tablets by mouth  daily as needed for indigestion or heartburn.     carboxymethylcellulose (REFRESH TEARS) 0.5 % SOLN Place 1 drop into both eyes 2 (two) times daily.     celecoxib (CELEBREX) 200 MG capsule Take 1 capsule (200 mg total) by mouth daily. 90 capsule 3   Coenzyme Q10 300 MG CAPS Take 1 capsule by mouth every morning.     diclofenac Sodium (VOLTAREN) 1 % GEL Apply 1 Application topically 2 (two) times daily.     glucose blood test strip Use as instructed 100 each 12   levocetirizine (XYZAL) 5 MG tablet Take 1 tablet (5 mg total) by mouth every evening. 90 tablet 3   lisinopril (ZESTRIL) 5 MG tablet Take 1 tablet (5 mg total) by mouth 2 (two) times daily. 180 tablet 3   LUTEIN-ZEAXANTHIN PO Take 1 tablet by mouth every morning.     MAGNESIUM CITRATE PO Take 500 mg by mouth daily.     Multiple Vitamins-Minerals (MULTIVITAMIN WITH MINERALS) tablet Take 1 tablet by mouth every morning.     Omega-3 Fatty Acids (FISH OIL) 1000 MG CAPS Take 2,000 mg by mouth daily.     omeprazole (PRILOSEC) 20 MG capsule Take 1 capsule (20 mg total) by mouth every morning. 90 capsule 3   ondansetron (ZOFRAN) 4 MG tablet Take 1 tablet (4 mg total) by mouth every 8 (eight) hours as needed for nausea or vomiting. 10 tablet 0   Semaglutide, 2 MG/DOSE, 8 MG/3ML  SOPN Inject 2 mg as directed once a week. 9 mL 0   sodium chloride (MURO 128) 2 % ophthalmic solution Place 1 drop into both eyes 2 (two) times daily.     SYNJARDY XR 12.08-998 MG TB24 Take 1 tablet by mouth 2 (two) times daily. 180 tablet 3   traMADol (ULTRAM) 50 MG tablet Take 1 tablet (50 mg total) by mouth every 6 (six) hours as needed for moderate pain. 30 tablet 0   TURMERIC PO Take 2,000 mg by mouth daily.     UNABLE TO FIND Take 1 tablet by mouth daily. Cinsulin supplement     vitamin E 180 MG (400 UNITS) capsule Take 400 Units by mouth daily.     No current facility-administered medications for this visit.    Allergies: Codeine, Benzonatate, and Epinephrine   Past Medical History:  Diagnosis Date   Allergy    Anxiety    Arthritis    hands   Cataract    Diabetes mellitus without complication (HCC)    type 2   Family history of adverse reaction to anesthesia    son has malignant hyperthermia, daughter does not daughter recently had c section without problems   GERD (gastroesophageal reflux disease)    Headache    sinus   Hyperlipidemia    Hypertension    PONV (postoperative nausea and vomiting)    nausea only   Ulcer, stomach peptic yrs ago    Past Surgical History:  Procedure Laterality Date   APPENDECTOMY     both hells bone spur repair     both heels with metal clips   both shoulder rotator cuff repair     CESAREAN SECTION     x 1   COLONOSCOPY WITH PROPOFOL N/A 09/07/2016   Procedure: COLONOSCOPY WITH PROPOFOL;  Surgeon: Charolett Bumpers, MD;  Location: [facility];  Service: Endoscopy;  Laterality: N/A;   colonscopy  06/2011   polyps   EYE SURGERY     FRACTURE SURGERY     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/10/2022   Procedure: REVERSE SHOULDER ARTHROPLASTY;  Surgeon: Francena Hanly, MD;  Location: [facility];  Service: Orthopedics;  Laterality: Right;  Please follow in room 6 if able   TUBAL LIGATION     VESICO-VAGINAL FISTULA REPAIR      Family History  Problem Relation Age of Onset   Hypertension Mother    COPD Mother    Cancer Mother    Arthritis Mother    Hypertension Father    Hyperlipidemia Father    Diabetes Father    Cancer Father    Alcohol abuse Father    Diabetes Brother    Alcohol abuse Brother    Hypertension Brother    Arthritis Maternal Grandmother    Birth defects Maternal Grandmother    Diabetes Paternal Grandmother    Heart disease Paternal Grandfather    Diabetes Paternal Aunt    Diabetes Paternal Aunt    Obesity Son     Social History   Tobacco Use   Smoking status: Former    Current packs/day: 1.00    Average packs/day: 1 pack/day for 14.0 years (14.0 ttl pk-yrs)     Types: Cigarettes   Smokeless tobacco: Never   Tobacco comments:    quit 31 yrs ago  Substance Use Topics   Alcohol use: Yes    Comment: rare    Subjective:   Concerned for strep throat; symptoms x 3 days; was exposed through family members; "  scratchy sensation initially but not it feels like razor blades."  Objective:  Vitals:   12/23/22 1142  BP: 136/72  Pulse: 97  Resp: 18  Temp: 98.2 F (36.8 C)  TempSrc: Oral  SpO2: 96%  Weight: 187 lb 3.2 oz (84.9 kg)  Height: 5\' 1"  (1.549 m)    General: Well developed, well nourished, in no acute distress  Skin : Warm and dry.  Head: Normocephalic and atraumatic  Lungs: Respirations unlabored;  Neurologic: Alert and oriented; speech intact; face symmetrical; moves all extremities well; CNII-XII intact without focal deficit   Assessment:  1. Sore throat     Plan:  Rapid strep is negative; based on clinical presentation, will go ahead and treat however; Rx for Z-pak #1 take as directed; increase fluids, rest and follow up worse, no better.  No follow-ups on file.  Orders Placed This Encounter  Procedures   POCT rapid strep A    Requested Prescriptions   Signed Prescriptions Disp Refills   azithromycin (ZITHROMAX Z-PAK) 250 MG tablet 6 each 0    Sig: Take 2 tablet today as directed; then take 1 tablet po every day x 4 days;   nystatin-triamcinolone (MYCOLOG II) cream 60 g 0    Sig: Apply 1 Application topically 2 (two) times daily.

## 2022-12-26 ENCOUNTER — Other Ambulatory Visit: Payer: Self-pay | Admitting: Family

## 2022-12-27 ENCOUNTER — Ambulatory Visit: Payer: Medicare Other | Admitting: Physical Therapy

## 2022-12-27 DIAGNOSIS — M25611 Stiffness of right shoulder, not elsewhere classified: Secondary | ICD-10-CM | POA: Diagnosis not present

## 2022-12-27 DIAGNOSIS — R600 Localized edema: Secondary | ICD-10-CM

## 2022-12-27 DIAGNOSIS — M25511 Pain in right shoulder: Secondary | ICD-10-CM | POA: Diagnosis not present

## 2022-12-27 DIAGNOSIS — M542 Cervicalgia: Secondary | ICD-10-CM | POA: Diagnosis not present

## 2022-12-27 NOTE — Therapy (Signed)
OUTPATIENT PHYSICAL THERAPY SHOULDER   Patient Name: Veronica Galvan MRN: [id number] DOB:[birth year], 67 y.o., female Today's Date: 12/27/2022  END OF SESSION:  PT End of Session - 12/27/22 1228     Visit Number 3    Date for PT Re-Evaluation 03/22/23    Authorization Type [employer] [place]    PT Start Time 1228    PT Stop Time 1315    PT Time Calculation (min) 47 min             Past Medical History:  Diagnosis Date   Allergy    Anxiety    Arthritis    hands   Cataract    Diabetes mellitus without complication (HCC)    type 2   Family history of adverse reaction to anesthesia    son has malignant hyperthermia, daughter does not daughter recently had c section without problems   GERD (gastroesophageal reflux disease)    Headache    sinus   Hyperlipidemia    Hypertension    PONV (postoperative nausea and vomiting)    nausea only   Ulcer, stomach peptic yrs ago   Past Surgical History:  Procedure Laterality Date   APPENDECTOMY     both hells bone spur repair     both heels with metal clips   both shoulder rotator cuff repair     CESAREAN SECTION     x 1   COLONOSCOPY WITH PROPOFOL N/A 09/07/2016   Procedure: COLONOSCOPY WITH PROPOFOL;  Surgeon: Charolett Bumpers, MD;  Location: [facility];  Service: Endoscopy;  Laterality: N/A;   colonscopy  06/2011   polyps   EYE SURGERY     FRACTURE SURGERY     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/10/2022   Procedure: REVERSE SHOULDER ARTHROPLASTY;  Surgeon: Francena Hanly, MD;  Location: [facility];  Service: Orthopedics;  Laterality: Right;  Please follow in room 6 if able   TUBAL LIGATION     VESICO-VAGINAL FISTULA REPAIR     Patient Active Problem List   Diagnosis Date Noted   Gastroesophageal reflux disease 11/16/2021   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 08/18/2021   Dermatofibroma 08/18/2021   History of malignant neoplasm of skin 08/18/2021   Lentigo 08/18/2021   Melanocytic nevi of  trunk 08/18/2021   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   Rosacea 08/18/2021   Actinic keratosis 08/18/2021   Sensorineural hearing loss (SNHL) of left ear with unrestricted hearing of right ear 07/26/2021   Tinnitus of left ear 07/26/2021   Acute recurrent maxillary sinusitis 06/22/2021   Primary hypertension 01/12/2021   Hyperlipidemia associated with type 2 diabetes mellitus (HCC) 01/12/2021   Arthritis 01/12/2021   Type II diabetes mellitus (HCC) 11/25/2019   Ingrown toenail 09/05/2018   Neck pain 01/29/2018   Tick bite 10/24/2017    PCP: Dayton Scrape, [name]  REFERRING PROVIDER: Rennis Chris, MD  REFERRING DIAG: s/p right reverse TSA  THERAPY DIAG:  Acute pain of right shoulder  Stiffness of right shoulder, not elsewhere classified  Localized edema  Rationale for Evaluation and Treatment: Rehabilitation  ONSET DATE: 11/05/22  SUBJECTIVE:  SUBJECTIVE STATEMENT: pt states sinus issues and sore throats   Hand dominance: Right  PERTINENT HISTORY: See above  PAIN:  Are you having pain? Yes: NPRS scale: 5/10 Pain location: right shoulder Pain description: dull ache at rest, sharp with motions Aggravating factors: quick motions, movements pain up to 10/10 Relieving factors: ice, rest, Tylenol at best 3/10  PRECAUTIONS: Shoulder Protocol in the chart  RED FLAGS: None   WEIGHT BEARING RESTRICTIONS: No  FALLS:  Has patient fallen in last 6 months? Yes. Number of falls 1  LIVING ENVIRONMENT: Lives with: lives alone Lives in: House/apartment Stairs: No Has following equipment at home: None  OCCUPATION: retired  PLOF: Independent  PATIENT GOALS:dress without difficulty, do hair, have good ROM and less pain  NEXT MD VISIT:   OBJECTIVE:   DIAGNOSTIC FINDINGS:  See  above  PATIENT SURVEYS:  FOTO 21  COGNITION: Overall cognitive status: Within functional limits for tasks assessed     SENSATION: WFL  POSTURE: Fwd head, rounded shoulders, elevated and guarded shoulder  UPPER EXTREMITY ROM:   Active ROM Right PROM eval Right  Shoulder flexion 35   Shoulder extension 5   Shoulder abduction 20   Shoulder adduction    Shoulder internal rotation 15   Shoulder external rotation 20   Elbow flexion 120   Elbow extension 5   Wrist flexion    Wrist extension    Wrist ulnar deviation    Wrist radial deviation    Wrist pronation    Wrist supination    (Blank rows = not tested)  UPPER EXTREMITY MMT:  No tested due to recent surgery  MMT Right eval Left eval  Shoulder flexion    Shoulder extension    Shoulder abduction    Shoulder adduction    Shoulder internal rotation    Shoulder external rotation    Middle trapezius    Lower trapezius    Elbow flexion    Elbow extension    Wrist flexion    Wrist extension    Wrist ulnar deviation    Wrist radial deviation    Wrist pronation    Wrist supination    Grip strength (lbs)    (Blank rows = not tested)  PALPATION:  Very tight and tender in the pectoral, upper trap, the entire right upper arm   TODAY'S TREATMENT:                                                                                                                                         DATE:   12/27/22 Gentle joint distraction and oscillation to help relax supine PROM to within protocol and pain tolerance STM to the right upper trap, neck and right upper arm, some scar mobilization Isometrics supine  Standing ball rolling on mat VASO 15 min medium pressure after  12/22/22 Gentle joint distraction and oscillation to help relax supine PROM to  within protocol and pain tolerance STM to the right upper trap, neck and right upper arm, some scar mobilization Supine chest press AAROM Supine isometric circles Gentle  isometrics circles Vaso medium pressure 34 degrees F  12/20/22 Weight 189# Vaso medium pressure 34 degree F right shoulder   PATIENT EDUCATION: Education details: poc/hep Person educated: Patient Education method: Programmer, multimedia, Facilities manager, Actor cues, Verbal cues, and Handouts Education comprehension: verbalized understanding  HOME EXERCISE PROGRAM: Access Code: [email or web address] URL: [email or web address] Date: 12/20/2022 Prepared by: Stacie Glaze  Exercises - Seated Shoulder Flexion Towel Slide at Table Top  - 2 x daily - 7 x weekly - 2 sets - 10 reps - 3 hold - Seated Shoulder External Rotation PROM on Table  - 2 x daily - 7 x weekly - 2 sets - 10 reps - 3 hold - Seated Elbow Extension and Shoulder External Rotation AAROM at Table with Towel  - 2 x daily - 7 x weekly - 2 sets - 10 reps - 3 hold - Circular Shoulder Pendulum with Table Support  - 2 x daily - 7 x weekly - 2 sets - 10 reps - 3 hold  ASSESSMENT:  CLINICAL IMPRESSION: Patient is a 67 y.o. female who was seen today for physical therapy evaluation and treatment for right reverse shoulder TSA.  Started protocol with PROM to pain tolerance, started gentle isometrics with her in supine, did very well, needs a lot of cues to relax.STG met  OBJECTIVE IMPAIRMENTS: cardiopulmonary status limiting activity, decreased activity tolerance, decreased endurance, decreased ROM, decreased strength, increased edema, increased muscle spasms, impaired flexibility, impaired UE functional use, improper body mechanics, postural dysfunction, and pain .  REHAB POTENTIAL: Good  CLINICAL DECISION MAKING: Evolving/moderate complexity  EVALUATION COMPLEXITY: Low   GOALS: Goals reviewed with patient? Yes  SHORT TERM GOALS: Target date: 01/01/23  Independent with initial HEP Goal status: 12/27/22 MET  LONG TERM GOALS: Target date: 03/22/23  Decrease pain 50% Goal status: INITIAL  2.  Dress without difficulty Goal status:  INITIAL  3.  Do hair without difficulty Goal status: INITIAL  4.  Increase AROM right shoulder flexion to 130 degrees Goal status: INITIAL  5.  Increase right shoulder ER to 60 degrees Goal status: INITIAL  6.  Return to water aerobics and or gym activity Goal status: INITIAL  PLAN:  PT FREQUENCY: 1-2x/week  PT DURATION: 12 weeks  PLANNED INTERVENTIONS: Therapeutic exercises, Therapeutic activity, Neuromuscular re-education, Balance training, Gait training, Patient/Family education, Self Care, Joint mobilization, Dry Needling, Electrical stimulation, Cryotherapy, Vasopneumatic device, and Manual therapy  PLAN FOR NEXT SESSION: Start protocol which is in the chart surgery was 11/10/22, NO BEHIND the BACK motions for 8 weeks   Kylea Berrong,ANGIE, PTA 12/27/2022, 12:29 PM [place] [facility] at [facility] [address] [facility] [place], [place], [address] Phone: [phone]   Fax:  [phone]  Patient Details  Name: ATISHA WINDON MRN: [id number] Date of Birth: [birth year] Referring Provider:  Francena Hanly, MD  Encounter Date: 12/27/2022   Suanne Marker, PTA 12/27/2022, 12:29 PM  [place] [facility] at [facility] [address] [facility] [place], [place], [address] Phone: [phone]   Fax:  [phone]

## 2022-12-27 NOTE — Therapy (Deleted)
[facility] Outpatient Rehabilitation at [facility] [address] [facility] [place], [place], [address] Phone: [phone]   Fax:  [phone]  Patient Details  Name: Veronica Galvan MRN: [id number] Date of Birth: [birth year] Referring Provider:  Francena Hanly, MD  Encounter Date: 12/27/2022   Suanne Marker, PTA 12/27/2022, 12:29 PM  [place] [facility] at [facility] [address] [facility] [place], [place], [address] Phone: [phone]   Fax:  [phone]

## 2022-12-28 DIAGNOSIS — C44712 Basal cell carcinoma of skin of right lower limb, including hip: Secondary | ICD-10-CM | POA: Diagnosis not present

## 2022-12-29 ENCOUNTER — Ambulatory Visit: Payer: Medicare Other | Admitting: Physical Therapy

## 2022-12-29 DIAGNOSIS — M25611 Stiffness of right shoulder, not elsewhere classified: Secondary | ICD-10-CM

## 2022-12-29 DIAGNOSIS — M542 Cervicalgia: Secondary | ICD-10-CM | POA: Diagnosis not present

## 2022-12-29 DIAGNOSIS — M25511 Pain in right shoulder: Secondary | ICD-10-CM | POA: Diagnosis not present

## 2022-12-29 DIAGNOSIS — R600 Localized edema: Secondary | ICD-10-CM | POA: Diagnosis not present

## 2022-12-29 NOTE — Therapy (Signed)
OUTPATIENT PHYSICAL THERAPY SHOULDER   Patient Name: Veronica Galvan MRN: [id number] DOB:[birth year], 67 y.o., female Today's Date: 12/29/2022  END OF SESSION:  PT End of Session - 12/29/22 0758     Visit Number 4    Date for PT Re-Evaluation 03/22/23    Authorization Type [employer] [place]    PT Start Time [phone]    PT Stop Time 0845    PT Time Calculation (min) 47 min             Past Medical History:  Diagnosis Date   Allergy    Anxiety    Arthritis    hands   Cataract    Diabetes mellitus without complication (HCC)    type 2   Family history of adverse reaction to anesthesia    son has malignant hyperthermia, daughter does not daughter recently had c section without problems   GERD (gastroesophageal reflux disease)    Headache    sinus   Hyperlipidemia    Hypertension    PONV (postoperative nausea and vomiting)    nausea only   Ulcer, stomach peptic yrs ago   Past Surgical History:  Procedure Laterality Date   APPENDECTOMY     both hells bone spur repair     both heels with metal clips   both shoulder rotator cuff repair     CESAREAN SECTION     x 1   COLONOSCOPY WITH PROPOFOL N/A 09/07/2016   Procedure: COLONOSCOPY WITH PROPOFOL;  Surgeon: Charolett Bumpers, MD;  Location: [facility];  Service: Endoscopy;  Laterality: N/A;   colonscopy  06/2011   polyps   EYE SURGERY     FRACTURE SURGERY     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/10/2022   Procedure: REVERSE SHOULDER ARTHROPLASTY;  Surgeon: Francena Hanly, MD;  Location: [facility];  Service: Orthopedics;  Laterality: Right;  Please follow in room 6 if able   TUBAL LIGATION     VESICO-VAGINAL FISTULA REPAIR     Patient Active Problem List   Diagnosis Date Noted   Gastroesophageal reflux disease 11/16/2021   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 08/18/2021   Dermatofibroma 08/18/2021   History of malignant neoplasm of skin 08/18/2021   Lentigo 08/18/2021   Melanocytic nevi of  trunk 08/18/2021   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   Rosacea 08/18/2021   Actinic keratosis 08/18/2021   Sensorineural hearing loss (SNHL) of left ear with unrestricted hearing of right ear 07/26/2021   Tinnitus of left ear 07/26/2021   Acute recurrent maxillary sinusitis 06/22/2021   Primary hypertension 01/12/2021   Hyperlipidemia associated with type 2 diabetes mellitus (HCC) 01/12/2021   Arthritis 01/12/2021   Type II diabetes mellitus (HCC) 11/25/2019   Ingrown toenail 09/05/2018   Neck pain 01/29/2018   Tick bite 10/24/2017    PCP: Dayton Scrape, [name]  REFERRING PROVIDER: Rennis Chris, MD  REFERRING DIAG: s/p right reverse TSA  THERAPY DIAG:  Acute pain of right shoulder  Stiffness of right shoulder, not elsewhere classified  Localized edema  Rationale for Evaluation and Treatment: Rehabilitation  ONSET DATE: 11/05/22  SUBJECTIVE:  SUBJECTIVE STATEMENT: I think I did too much yesterday ,pretty sore   Hand dominance: Right  PERTINENT HISTORY: See above  PAIN:  Are you having pain? Yes: NPRS scale: 5/10 Pain location: right shoulder Pain description: dull ache at rest, sharp with motions Aggravating factors: quick motions, movements pain up to 10/10 Relieving factors: ice, rest, Tylenol at best 3/10  PRECAUTIONS: Shoulder Protocol in the chart  RED FLAGS: None   WEIGHT BEARING RESTRICTIONS: No  FALLS:  Has patient fallen in last 6 months? Yes. Number of falls 1  LIVING ENVIRONMENT: Lives with: lives alone Lives in: House/apartment Stairs: No Has following equipment at home: None  OCCUPATION: retired  PLOF: Independent  PATIENT GOALS:dress without difficulty, do hair, have good ROM and less pain  NEXT MD VISIT:   OBJECTIVE:   DIAGNOSTIC FINDINGS:  See  above  PATIENT SURVEYS:  FOTO 21  COGNITION: Overall cognitive status: Within functional limits for tasks assessed     SENSATION: WFL  POSTURE: Fwd head, rounded shoulders, elevated and guarded shoulder  UPPER EXTREMITY ROM:   Active ROM Right PROM eval Right  Shoulder flexion 35   Shoulder extension 5   Shoulder abduction 20   Shoulder adduction    Shoulder internal rotation 15   Shoulder external rotation 20   Elbow flexion 120   Elbow extension 5   Wrist flexion    Wrist extension    Wrist ulnar deviation    Wrist radial deviation    Wrist pronation    Wrist supination    (Blank rows = not tested)  UPPER EXTREMITY MMT:  No tested due to recent surgery  MMT Right eval Left eval  Shoulder flexion    Shoulder extension    Shoulder abduction    Shoulder adduction    Shoulder internal rotation    Shoulder external rotation    Middle trapezius    Lower trapezius    Elbow flexion    Elbow extension    Wrist flexion    Wrist extension    Wrist ulnar deviation    Wrist radial deviation    Wrist pronation    Wrist supination    Grip strength (lbs)    (Blank rows = not tested)  PALPATION:  Very tight and tender in the pectoral, upper trap, the entire right upper arm   TODAY'S TREATMENT:                                                                                                                                         DATE:   12/29/22 Gentle joint distraction and oscillation to help relax supine PROM to within protocol and pain tolerance STM to the right upper trap, neck and right upper arm, some scar mobilization Isometrics supine  VASO 15 min medium pressure after  12/27/22 Gentle joint distraction and oscillation to help relax supine PROM to within protocol and  pain tolerance STM to the right upper trap, neck and right upper arm, some scar mobilization Isometrics supine  Standing ball rolling on mat VASO 15 min medium pressure  after  12/22/22 Gentle joint distraction and oscillation to help relax supine PROM to within protocol and pain tolerance STM to the right upper trap, neck and right upper arm, some scar mobilization Supine chest press AAROM Supine isometric circles Gentle isometrics circles Vaso medium pressure 34 degrees F  12/20/22 Weight 189# Vaso medium pressure 34 degree F right shoulder   PATIENT EDUCATION: Education details: poc/hep Person educated: Patient Education method: Programmer, multimedia, Facilities manager, Actor cues, Verbal cues, and Handouts Education comprehension: verbalized understanding  HOME EXERCISE PROGRAM: Access Code: [email or web address] URL: [email or web address] Date: 12/20/2022 Prepared by: Stacie Glaze  Exercises - Seated Shoulder Flexion Towel Slide at Table Top  - 2 x daily - 7 x weekly - 2 sets - 10 reps - 3 hold - Seated Shoulder External Rotation PROM on Table  - 2 x daily - 7 x weekly - 2 sets - 10 reps - 3 hold - Seated Elbow Extension and Shoulder External Rotation AAROM at Table with Towel  - 2 x daily - 7 x weekly - 2 sets - 10 reps - 3 hold - Circular Shoulder Pendulum with Table Support  - 2 x daily - 7 x weekly - 2 sets - 10 reps - 3 hold  ASSESSMENT:  CLINICAL IMPRESSION: very tender over ant shld and scar. Increased guarding and pain with ROM, cued to relax OBJECTIVE IMPAIRMENTS: cardiopulmonary status limiting activity, decreased activity tolerance, decreased endurance, decreased ROM, decreased strength, increased edema, increased muscle spasms, impaired flexibility, impaired UE functional use, improper body mechanics, postural dysfunction, and pain .  REHAB POTENTIAL: Good  CLINICAL DECISION MAKING: Evolving/moderate complexity  EVALUATION COMPLEXITY: Low   GOALS: Goals reviewed with patient? Yes  SHORT TERM GOALS: Target date: 01/01/23  Independent with initial HEP Goal status: 12/27/22 MET  LONG TERM GOALS: Target date: 03/22/23  Decrease  pain 50% Goal status: INITIAL  2.  Dress without difficulty Goal status: INITIAL  3.  Do hair without difficulty Goal status: INITIAL  4.  Increase AROM right shoulder flexion to 130 degrees Goal status: INITIAL  5.  Increase right shoulder ER to 60 degrees Goal status: INITIAL  6.  Return to water aerobics and or gym activity Goal status: INITIAL  PLAN:  PT FREQUENCY: 1-2x/week  PT DURATION: 12 weeks  PLANNED INTERVENTIONS: Therapeutic exercises, Therapeutic activity, Neuromuscular re-education, Balance training, Gait training, Patient/Family education, Self Care, Joint mobilization, Dry Needling, Electrical stimulation, Cryotherapy, Vasopneumatic device, and Manual therapy  PLAN FOR NEXT SESSION: Start protocol which is in the chart surgery was 11/10/22, NO BEHIND the BACK motions for 8 weeks   Gwen Edler,ANGIE, PTA 12/29/2022, 7:59 AM [facility] Outpatient Rehabilitation at [facility] [address] [facility] [place], [place], [address] Phone: [phone]   Fax:  [phone]  Patient Details  Name: Veronica Galvan MRN: [id number] Date of Birth: 05-07-1955 Referring Provider:  Francena Hanly, MD  Encounter Date: 12/29/2022   Suanne Marker, PTA 12/29/2022, 7:59 AM  [place] at [facility] [address] [facility] [place], [place], [address] Phone: [phone]   Fax:  [phone]  Outpatient Rehabilitation at [facility] [address] [address] [place], [place], [address] Phone: [phone]   Fax:  [phone]

## 2023-01-03 ENCOUNTER — Ambulatory Visit: Payer: Medicare Other | Attending: Orthopedic Surgery | Admitting: Physical Therapy

## 2023-01-03 DIAGNOSIS — M25611 Stiffness of right shoulder, not elsewhere classified: Secondary | ICD-10-CM | POA: Insufficient documentation

## 2023-01-03 DIAGNOSIS — R600 Localized edema: Secondary | ICD-10-CM | POA: Diagnosis not present

## 2023-01-03 DIAGNOSIS — M542 Cervicalgia: Secondary | ICD-10-CM | POA: Diagnosis not present

## 2023-01-03 DIAGNOSIS — M25511 Pain in right shoulder: Secondary | ICD-10-CM | POA: Insufficient documentation

## 2023-01-03 NOTE — Therapy (Signed)
OUTPATIENT PHYSICAL THERAPY SHOULDER   Patient Name: Veronica Galvan MRN: [id number] DOB:Dec 21, 1955, 67 y.o., female Today's Date: 01/03/2023  END OF SESSION:  PT End of Session - 01/03/23 0839     Visit Number 5    Date for PT Re-Evaluation 03/22/23    Authorization Type [employer] [place]    PT Start Time 0845    PT Stop Time 0930    PT Time Calculation (min) 45 min             Past Medical History:  Diagnosis Date   Allergy    Anxiety    Arthritis    hands   Cataract    Diabetes mellitus without complication (HCC)    type 2   Family history of adverse reaction to anesthesia    son has malignant hyperthermia, daughter does not daughter recently had c section without problems   GERD (gastroesophageal reflux disease)    Headache    sinus   Hyperlipidemia    Hypertension    PONV (postoperative nausea and vomiting)    nausea only   Ulcer, stomach peptic yrs ago   Past Surgical History:  Procedure Laterality Date   APPENDECTOMY     both hells bone spur repair     both heels with metal clips   both shoulder rotator cuff repair     CESAREAN SECTION     x 1   COLONOSCOPY WITH PROPOFOL N/A 09/07/2016   Procedure: COLONOSCOPY WITH PROPOFOL;  Surgeon: Charolett Bumpers, MD;  Location: [facility];  Service: Endoscopy;  Laterality: N/A;   colonscopy  06/2011   polyps   EYE SURGERY     FRACTURE SURGERY     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/10/2022   Procedure: REVERSE SHOULDER ARTHROPLASTY;  Surgeon: Francena Hanly, MD;  Location: [facility];  Service: Orthopedics;  Laterality: Right;  Please follow in room 6 if able   TUBAL LIGATION     VESICO-VAGINAL FISTULA REPAIR     Patient Active Problem List   Diagnosis Date Noted   Gastroesophageal reflux disease 11/16/2021   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 08/18/2021   Dermatofibroma 08/18/2021   History of malignant neoplasm of skin 08/18/2021   Lentigo 08/18/2021   Melanocytic nevi of  trunk 08/18/2021   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   Rosacea 08/18/2021   Actinic keratosis 08/18/2021   Sensorineural hearing loss (SNHL) of left ear with unrestricted hearing of right ear 07/26/2021   Tinnitus of left ear 07/26/2021   Acute recurrent maxillary sinusitis 06/22/2021   Primary hypertension 01/12/2021   Hyperlipidemia associated with type 2 diabetes mellitus (HCC) 01/12/2021   Arthritis 01/12/2021   Type II diabetes mellitus (HCC) 11/25/2019   Ingrown toenail 09/05/2018   Neck pain 01/29/2018   Tick bite 10/24/2017    PCP: Dayton Scrape, [name]  REFERRING PROVIDER: Rennis Chris, MD  REFERRING DIAG: s/p right reverse TSA  THERAPY DIAG:  Acute pain of right shoulder  Stiffness of right shoulder, not elsewhere classified  Localized edema  Rationale for Evaluation and Treatment: Rehabilitation  ONSET DATE: 11/05/22  SUBJECTIVE:  SUBJECTIVE STATEMENT:  Way over did since her last visit,caring for grand baby and dinner party- I have paid for it  Hand dominance: Right  PERTINENT HISTORY: See above  PAIN:  Are you having pain? Yes: NPRS scale: 7/10 Pain location: right shoulder Pain description: dull ache at rest, sharp with motions Aggravating factors: quick motions, movements pain up to 10/10 Relieving factors: ice, rest, Tylenol at best 3/10  PRECAUTIONS: Shoulder Protocol in the chart  RED FLAGS: None   WEIGHT BEARING RESTRICTIONS: No  FALLS:  Has patient fallen in last 6 months? Yes. Number of falls 1  LIVING ENVIRONMENT: Lives with: lives alone Lives in: House/apartment Stairs: No Has following equipment at home: None  OCCUPATION: retired  PLOF: Independent  PATIENT GOALS:dress without difficulty, do hair, have good ROM and less pain  NEXT MD VISIT:    OBJECTIVE:   DIAGNOSTIC FINDINGS:  See above  PATIENT SURVEYS:  FOTO 21  COGNITION: Overall cognitive status: Within functional limits for tasks assessed     SENSATION: WFL  POSTURE: Fwd head, rounded shoulders, elevated and guarded shoulder  UPPER EXTREMITY ROM:   Active ROM Right PROM eval Right  Shoulder flexion 35   Shoulder extension 5   Shoulder abduction 20   Shoulder adduction    Shoulder internal rotation 15   Shoulder external rotation 20   Elbow flexion 120   Elbow extension 5   Wrist flexion    Wrist extension    Wrist ulnar deviation    Wrist radial deviation    Wrist pronation    Wrist supination    (Blank rows = not tested)  UPPER EXTREMITY MMT:  No tested due to recent surgery  MMT Right eval Left eval  Shoulder flexion    Shoulder extension    Shoulder abduction    Shoulder adduction    Shoulder internal rotation    Shoulder external rotation    Middle trapezius    Lower trapezius    Elbow flexion    Elbow extension    Wrist flexion    Wrist extension    Wrist ulnar deviation    Wrist radial deviation    Wrist pronation    Wrist supination    Grip strength (lbs)    (Blank rows = not tested)  PALPATION:  Very tight and tender in the pectoral, upper trap, the entire right upper arm   TODAY'S TREATMENT:                                                                                                                                         DATE:   01/03/23 UBE L 1 2 min fwd/2 min backward- PTA assist Finger ladder flex and abd 5 x each Active hitchhiker and upper cuts 10 x each Gentle joint distraction and oscillation to help relax supine PROM to within protocol and pain tolerance STM to the right  upper trap, neck and right upper arm, some scar mobilization Isometrics supine  VASO 15 min medium pressure after   12/29/22 Gentle joint distraction and oscillation to help relax supine PROM to within protocol and pain  tolerance STM to the right upper trap, neck and right upper arm, some scar mobilization Isometrics supine  VASO 15 min medium pressure after  12/27/22 Gentle joint distraction and oscillation to help relax supine PROM to within protocol and pain tolerance STM to the right upper trap, neck and right upper arm, some scar mobilization Isometrics supine  Standing ball rolling on mat VASO 15 min medium pressure after  12/22/22 Gentle joint distraction and oscillation to help relax supine PROM to within protocol and pain tolerance STM to the right upper trap, neck and right upper arm, some scar mobilization Supine chest press AAROM Supine isometric circles Gentle isometrics circles Vaso medium pressure 34 degrees F  12/20/22 Weight 189# Vaso medium pressure 34 degree F right shoulder   PATIENT EDUCATION: Education details: poc/hep Person educated: Patient Education method: Programmer, multimedia, Facilities manager, Actor cues, Verbal cues, and Handouts Education comprehension: verbalized understanding  HOME EXERCISE PROGRAM: Access Code: [email or web address] URL: [email or web address] Date: 12/20/2022 Prepared by: Stacie Glaze  Exercises - Seated Shoulder Flexion Towel Slide at Table Top  - 2 x daily - 7 x weekly - 2 sets - 10 reps - 3 hold - Seated Shoulder External Rotation PROM on Table  - 2 x daily - 7 x weekly - 2 sets - 10 reps - 3 hold - Seated Elbow Extension and Shoulder External Rotation AAROM at Table with Towel  - 2 x daily - 7 x weekly - 2 sets - 10 reps - 3 hold - Circular Shoulder Pendulum with Table Support  - 2 x daily - 7 x weekly - 2 sets - 10 reps - 3 hold  ASSESSMENT:  CLINICAL IMPRESSION:  pt arrived very sore form overdoing . Progressed per protocol with some advancement. PROM and stretching per protocol- very guarded and pain limited  OBJECTIVE IMPAIRMENTS: cardiopulmonary status limiting activity, decreased activity tolerance, decreased endurance, decreased  ROM, decreased strength, increased edema, increased muscle spasms, impaired flexibility, impaired UE functional use, improper body mechanics, postural dysfunction, and pain .  REHAB POTENTIAL: Good  CLINICAL DECISION MAKING: Evolving/moderate complexity  EVALUATION COMPLEXITY: Low   GOALS: Goals reviewed with patient? Yes  SHORT TERM GOALS: Target date: 01/01/23  Independent with initial HEP Goal status: 12/27/22 MET  LONG TERM GOALS: Target date: 03/22/23  Decrease pain 50% Goal status: INITIAL  2.  Dress without difficulty Goal status: INITIAL  3.  Do hair without difficulty Goal status: INITIAL  4.  Increase AROM right shoulder flexion to 130 degrees Goal status: INITIAL  5.  Increase right shoulder ER to 60 degrees Goal status: INITIAL  6.  Return to water aerobics and or gym activity Goal status: INITIAL  PLAN:  PT FREQUENCY: 1-2x/week  PT DURATION: 12 weeks  PLANNED INTERVENTIONS: Therapeutic exercises, Therapeutic activity, Neuromuscular re-education, Balance training, Gait training, Patient/Family education, Self Care, Joint mobilization, Dry Needling, Electrical stimulation, Cryotherapy, Vasopneumatic device, and Manual therapy  PLAN FOR NEXT SESSION: Start protocol which is in the chart surgery was 11/10/22, NO BEHIND the BACK motions for 8 weeks   Jaice Digioia,ANGIE, PTA 01/03/2023, 8:39 AM [facility] [address] [facility] [place], [place], [address] Phone: [phone]   Fax:  [phone]  [place], [place], [address] Phone: [phone]   Fax:  [phone]  Patient Details  Name: MARIESA SAHLI MRN: [id number] Date of Birth: June 28, 1955 Referring Provider:  Francena Hanly, MD  Encounter Date: 01/03/2023   Suanne Marker, PTA 01/03/2023, 8:39 AM  [facility] at [facility] [address] [place], [place], [address] Phone: [phone]   Fax:  [phone]

## 2023-01-05 ENCOUNTER — Ambulatory Visit: Payer: Medicare Other | Admitting: Physical Therapy

## 2023-01-05 DIAGNOSIS — R600 Localized edema: Secondary | ICD-10-CM | POA: Diagnosis not present

## 2023-01-05 DIAGNOSIS — M25611 Stiffness of right shoulder, not elsewhere classified: Secondary | ICD-10-CM

## 2023-01-05 DIAGNOSIS — M542 Cervicalgia: Secondary | ICD-10-CM | POA: Diagnosis not present

## 2023-01-05 DIAGNOSIS — M25511 Pain in right shoulder: Secondary | ICD-10-CM | POA: Diagnosis not present

## 2023-01-05 NOTE — Therapy (Signed)
OUTPATIENT PHYSICAL THERAPY SHOULDER   Patient Name: Veronica Galvan MRN: [id number] DOB:[birth year], 67 y.o., female Today's Date: 01/05/2023  END OF SESSION:  PT End of Session - 01/05/23 1609     Visit Number 6    Date for PT Re-Evaluation 03/22/23    Authorization Type [employer]    PT Start Time 1611    PT Stop Time 1705    PT Time Calculation (min) 54 min             Past Medical History:  Diagnosis Date   Allergy    Anxiety    Arthritis    hands   Cataract    Diabetes mellitus without complication (HCC)    type 2   Family history of adverse reaction to anesthesia    son has malignant hyperthermia, daughter does not daughter recently had c section without problems   GERD (gastroesophageal reflux disease)    Headache    sinus   Hyperlipidemia    Hypertension    PONV (postoperative nausea and vomiting)    nausea only   Ulcer, stomach peptic yrs ago   Past Surgical History:  Procedure Laterality Date   APPENDECTOMY     both hells bone spur repair     both heels with metal clips   both shoulder rotator cuff repair     CESAREAN SECTION     x 1   COLONOSCOPY WITH PROPOFOL N/A 09/07/2016   Procedure: COLONOSCOPY WITH PROPOFOL;  Surgeon: Charolett Bumpers, MD;  Location: [facility];  Service: Endoscopy;  Laterality: N/A;   colonscopy  06/2011   polyps   EYE SURGERY     FRACTURE SURGERY     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/10/2022   Procedure: REVERSE SHOULDER ARTHROPLASTY;  Surgeon: Francena Hanly, MD;  Location: [facility];  Service: Orthopedics;  Laterality: Right;  Please follow in room 6 if able   TUBAL LIGATION     VESICO-VAGINAL FISTULA REPAIR     Patient Active Problem List   Diagnosis Date Noted   Gastroesophageal reflux disease 11/16/2021   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 08/18/2021   Dermatofibroma 08/18/2021   History of malignant neoplasm of skin 08/18/2021   Lentigo 08/18/2021   Melanocytic nevi of  trunk 08/18/2021   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   Rosacea 08/18/2021   Actinic keratosis 08/18/2021   Sensorineural hearing loss (SNHL) of left ear with unrestricted hearing of right ear 07/26/2021   Tinnitus of left ear 07/26/2021   Acute recurrent maxillary sinusitis 06/22/2021   Primary hypertension 01/12/2021   Hyperlipidemia associated with type 2 diabetes mellitus (HCC) 01/12/2021   Arthritis 01/12/2021   Type II diabetes mellitus (HCC) 11/25/2019   Ingrown toenail 09/05/2018   Neck pain 01/29/2018   Tick bite 10/24/2017    PCP: Dayton Scrape, [name]  REFERRING PROVIDER: Rennis Chris, MD  REFERRING DIAG: s/p right reverse TSA  THERAPY DIAG:  Acute pain of right shoulder  Stiffness of right shoulder, not elsewhere classified  Localized edema  Rationale for Evaluation and Treatment: Rehabilitation  ONSET DATE: 11/05/22  SUBJECTIVE:  SUBJECTIVE STATEMENT:  trying to behave but its hard, pulled myself up in a golf cart and strained my RT shld - in tear. Neck is super tight PERTINENT HISTORY: See above  PAIN:  Are you having pain? Yes: NPRS scale: 7/10 Pain location: right shoulder Pain description: dull ache at rest, sharp with motions Aggravating factors: quick motions, movements pain up to 10/10 Relieving factors: ice, rest, Tylenol at best 3/10  PRECAUTIONS: Shoulder Protocol in the chart  RED FLAGS: None   WEIGHT BEARING RESTRICTIONS: No  FALLS:  Has patient fallen in last 6 months? Yes. Number of falls 1  LIVING ENVIRONMENT: Lives with: lives alone Lives in: House/apartment Stairs: No Has following equipment at home: None  OCCUPATION: retired  PLOF: Independent  PATIENT GOALS:dress without difficulty, do hair, have good ROM and less pain  NEXT MD VISIT:    OBJECTIVE:   DIAGNOSTIC FINDINGS:  See above  PATIENT SURVEYS:  FOTO 21  COGNITION: Overall cognitive status: Within functional limits for tasks assessed     SENSATION: WFL  POSTURE: Fwd head, rounded shoulders, elevated and guarded shoulder  UPPER EXTREMITY ROM:   Active ROM Right PROM eval Right PROM Supine 01/05/23  Shoulder flexion 35  118  Shoulder extension 5    Shoulder abduction 20  90  Shoulder adduction     Shoulder internal rotation 15    Shoulder external rotation 20  45  Elbow flexion 120    Elbow extension 5    Wrist flexion     Wrist extension     Wrist ulnar deviation     Wrist radial deviation     Wrist pronation     Wrist supination     (Blank rows = not tested)  UPPER EXTREMITY MMT:  No tested due to recent surgery  MMT Right eval Left eval  Shoulder flexion    Shoulder extension    Shoulder abduction    Shoulder adduction    Shoulder internal rotation    Shoulder external rotation    Middle trapezius    Lower trapezius    Elbow flexion    Elbow extension    Wrist flexion    Wrist extension    Wrist ulnar deviation    Wrist radial deviation    Wrist pronation    Wrist supination    Grip strength (lbs)    (Blank rows = not tested)  PALPATION:  Very tight and tender in the pectoral, upper trap, the entire right upper arm   TODAY'S TREATMENT:                                                                                                                                         DATE:   01/05/23 UBE L 1 2.5 min fwd/2.5 min backward- compensation,backward easier Ball rolling on mat  Gentle joint distraction and oscillation to help relax supine PROM to within  protocol and pain tolerance- with ROM STM to the right upper trap, neck and right upper arm, some scar mobilization Isometrics supine  VASO 15 min medium pressure after    01/03/23 UBE L 1 2 min fwd/2 min backward- PTA assist Finger ladder flex and abd 5 x each Active  hitchhiker and upper cuts 10 x each Gentle joint distraction and oscillation to help relax supine PROM to within protocol and pain tolerance STM to the right upper trap, neck and right upper arm, some scar mobilization Isometrics supine  VASO 15 min medium pressure after   12/29/22 Gentle joint distraction and oscillation to help relax supine PROM to within protocol and pain tolerance STM to the right upper trap, neck and right upper arm, some scar mobilization Isometrics supine  VASO 15 min medium pressure after  12/27/22 Gentle joint distraction and oscillation to help relax supine PROM to within protocol and pain tolerance STM to the right upper trap, neck and right upper arm, some scar mobilization Isometrics supine  Standing ball rolling on mat VASO 15 min medium pressure after  12/22/22 Gentle joint distraction and oscillation to help relax supine PROM to within protocol and pain tolerance STM to the right upper trap, neck and right upper arm, some scar mobilization Supine chest press AAROM Supine isometric circles Gentle isometrics circles Vaso medium pressure 34 degrees F  12/20/22 Weight 189# Vaso medium pressure 34 degree F right shoulder   PATIENT EDUCATION: Education details: poc/hep Person educated: Patient Education method: Programmer, multimedia, Facilities manager, Actor cues, Verbal cues, and Handouts Education comprehension: verbalized understanding  HOME EXERCISE PROGRAM: Access Code: [email or web address] URL: [email or web address] Date: 12/20/2022 Prepared by: Stacie Glaze  Exercises - Seated Shoulder Flexion Towel Slide at Table Top  - 2 x daily - 7 x weekly - 2 sets - 10 reps - 3 hold - Seated Shoulder External Rotation PROM on Table  - 2 x daily - 7 x weekly - 2 sets - 10 reps - 3 hold - Seated Elbow Extension and Shoulder External Rotation AAROM at Table with Towel  - 2 x daily - 7 x weekly - 2 sets - 10 reps - 3 hold - Circular Shoulder Pendulum with  Table Support  - 2 x daily - 7 x weekly - 2 sets - 10 reps - 3 hold  ASSESSMENT:  CLINICAL IMPRESSION:  pt arrived very sore from overdoing again.Progressed per protocol with some advancement. PROM and stretching per protocol- very guarded and pain limited. TP left UT  OBJECTIVE IMPAIRMENTS: cardiopulmonary status limiting activity, decreased activity tolerance, decreased endurance, decreased ROM, decreased strength, increased edema, increased muscle spasms, impaired flexibility, impaired UE functional use, improper body mechanics, postural dysfunction, and pain .  REHAB POTENTIAL: Good  CLINICAL DECISION MAKING: Evolving/moderate complexity  EVALUATION COMPLEXITY: Low   GOALS: Goals reviewed with patient? Yes  SHORT TERM GOALS: Target date: 01/01/23  Independent with initial HEP Goal status: 12/27/22 MET  LONG TERM GOALS: Target date: 03/22/23  Decrease pain 50% Goal status: 01/05/23 progressing  2.  Dress without difficulty Goal status: INITIAL  3.  Do hair without difficulty Goal status: INITIAL  4.  Increase AROM right shoulder flexion to 130 degrees Goal status: 01/05/23 progressing  5.  Increase right shoulder ER to 60 degrees Goal status: INITIAL  6.  Return to water aerobics and or gym activity Goal status: INITIAL  PLAN:  PT FREQUENCY: 1-2x/week  PT DURATION: 12 weeks  PLANNED INTERVENTIONS: Therapeutic exercises, Therapeutic activity, Neuromuscular re-education, Balance training,  Gait training, Patient/Family education, Self Care, Joint mobilization, Dry Needling, Electrical stimulation, Cryotherapy, Vasopneumatic device, and Manual therapy  PLAN FOR NEXT SESSION: Start protocol which is in the chart surgery was 11/10/22, NO BEHIND the BACK motions for 8 weeks   Gurtej Noyola,ANGIE, PTA 01/05/2023, 4:10 PM [facility] at [facility] [address] [facility] [place], [place], [address] Phone: [phone]   Fax:   [phone]  [place], [place], [address] Phone: [phone]   Fax:  [phone]  Patient Details  Name: LEZA ASHDOWN MRN: [id number] Date of Birth: 08/16/1955 Referring Provider:  Francena Hanly, MD

## 2023-01-06 ENCOUNTER — Ambulatory Visit: Payer: Medicare Other | Admitting: Family

## 2023-01-10 ENCOUNTER — Ambulatory Visit: Payer: Medicare Other | Admitting: Physical Therapy

## 2023-01-10 DIAGNOSIS — R600 Localized edema: Secondary | ICD-10-CM | POA: Diagnosis not present

## 2023-01-10 DIAGNOSIS — M542 Cervicalgia: Secondary | ICD-10-CM | POA: Diagnosis not present

## 2023-01-10 DIAGNOSIS — M25511 Pain in right shoulder: Secondary | ICD-10-CM | POA: Diagnosis not present

## 2023-01-10 DIAGNOSIS — M25611 Stiffness of right shoulder, not elsewhere classified: Secondary | ICD-10-CM | POA: Diagnosis not present

## 2023-01-10 DIAGNOSIS — H35371 Puckering of macula, right eye: Secondary | ICD-10-CM | POA: Diagnosis not present

## 2023-01-10 LAB — HM DIABETES EYE EXAM

## 2023-01-10 NOTE — Therapy (Signed)
OUTPATIENT PHYSICAL THERAPY SHOULDER   Patient Name: Veronica Galvan MRN: [id number] DOB:[birth year], 67 y.o., female Today's Date: 01/10/2023  END OF SESSION:  PT End of Session - 01/10/23 0758     Visit Number 7    Date for PT Re-Evaluation 03/22/23    Authorization Type [employer] [place]    PT Start Time [phone]    PT Stop Time 0850    PT Time Calculation (min) 52 min             Past Medical History:  Diagnosis Date   Allergy    Anxiety    Arthritis    hands   Cataract    Diabetes mellitus without complication (HCC)    type 2   Family history of adverse reaction to anesthesia    son has malignant hyperthermia, daughter does not daughter recently had c section without problems   GERD (gastroesophageal reflux disease)    Headache    sinus   Hyperlipidemia    Hypertension    PONV (postoperative nausea and vomiting)    nausea only   Ulcer, stomach peptic yrs ago   Past Surgical History:  Procedure Laterality Date   APPENDECTOMY     both hells bone spur repair     both heels with metal clips   both shoulder rotator cuff repair     CESAREAN SECTION     x 1   COLONOSCOPY WITH PROPOFOL N/A 09/07/2016   Procedure: COLONOSCOPY WITH PROPOFOL;  Surgeon: Charolett Bumpers, MD;  Location: [facility];  Service: Endoscopy;  Laterality: N/A;   colonscopy  06/2011   polyps   EYE SURGERY     FRACTURE SURGERY     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/10/2022   Procedure: REVERSE SHOULDER ARTHROPLASTY;  Surgeon: Francena Hanly, MD;  Location: [facility];  Service: Orthopedics;  Laterality: Right;  Please follow in room 6 if able   TUBAL LIGATION     VESICO-VAGINAL FISTULA REPAIR     Patient Active Problem List   Diagnosis Date Noted   Gastroesophageal reflux disease 11/16/2021   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 08/18/2021   Dermatofibroma 08/18/2021   History of malignant neoplasm of skin 08/18/2021   Lentigo 08/18/2021   Melanocytic nevi of  trunk 08/18/2021   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   Rosacea 08/18/2021   Actinic keratosis 08/18/2021   Sensorineural hearing loss (SNHL) of left ear with unrestricted hearing of right ear 07/26/2021   Tinnitus of left ear 07/26/2021   Acute recurrent maxillary sinusitis 06/22/2021   Primary hypertension 01/12/2021   Hyperlipidemia associated with type 2 diabetes mellitus (HCC) 01/12/2021   Arthritis 01/12/2021   Type II diabetes mellitus (HCC) 11/25/2019   Ingrown toenail 09/05/2018   Neck pain 01/29/2018   Tick bite 10/24/2017    PCP: Dayton Scrape, [name]  REFERRING PROVIDER: Rennis Chris, MD  REFERRING DIAG: s/p right reverse TSA  THERAPY DIAG:  Acute pain of right shoulder  Stiffness of right shoulder, not elsewhere classified  Localized edema  Rationale for Evaluation and Treatment: Rehabilitation  ONSET DATE: 11/05/22  SUBJECTIVE:  SUBJECTIVE STATEMENT:  babysat over weekend and did not pick up baby but did carry baby PERTINENT HISTORY: See above  PAIN:  Are you having pain? Yes: NPRS scale: 7/10 Pain location: right shoulder Pain description: dull ache at rest, sharp with motions Aggravating factors: quick motions, movements pain up to 10/10 Relieving factors: ice, rest, Tylenol at best 3/10  PRECAUTIONS: Shoulder Protocol in the chart  RED FLAGS: None   WEIGHT BEARING RESTRICTIONS: No  FALLS:  Has patient fallen in last 6 months? Yes. Number of falls 1  LIVING ENVIRONMENT: Lives with: lives alone Lives in: House/apartment Stairs: No Has following equipment at home: None  OCCUPATION: retired  PLOF: Independent  PATIENT GOALS:dress without difficulty, do hair, have good ROM and less pain  NEXT MD VISIT:   OBJECTIVE:   DIAGNOSTIC FINDINGS:  See  above  PATIENT SURVEYS:  FOTO 21  COGNITION: Overall cognitive status: Within functional limits for tasks assessed     SENSATION: WFL  POSTURE: Fwd head, rounded shoulders, elevated and guarded shoulder  UPPER EXTREMITY ROM:   Active ROM Right PROM eval Right PROM Supine 01/05/23  Shoulder flexion 35  118  Shoulder extension 5    Shoulder abduction 20  90  Shoulder adduction     Shoulder internal rotation 15    Shoulder external rotation 20  45  Elbow flexion 120    Elbow extension 5    Wrist flexion     Wrist extension     Wrist ulnar deviation     Wrist radial deviation     Wrist pronation     Wrist supination     (Blank rows = not tested)  UPPER EXTREMITY MMT:  No tested due to recent surgery  MMT Right eval Left eval  Shoulder flexion    Shoulder extension    Shoulder abduction    Shoulder adduction    Shoulder internal rotation    Shoulder external rotation    Middle trapezius    Lower trapezius    Elbow flexion    Elbow extension    Wrist flexion    Wrist extension    Wrist ulnar deviation    Wrist radial deviation    Wrist pronation    Wrist supination    Grip strength (lbs)    (Blank rows = not tested)  PALPATION:  Very tight and tender in the pectoral, upper trap, the entire right upper arm   TODAY'S TREATMENT:                                                                                                                                         DATE:   01/10/23 Nustep L 3 Finger ladder flex and abd 5 x each Cane AA flex ,abd,ER and chest press 10 x each UBE L 1 2 min each way Gentle joint distraction and oscillation to help relax supine PROM  to within protocol and pain tolerance- with ROM STM to the right upper trap, neck and right upper arm, some scar mobilization Isometrics supine  VASO 15 min medium pressure after   01/05/23 UBE L 1 2.5 min fwd/2.5 min backward- compensation,backward easier Ball rolling on mat  Gentle joint  distraction and oscillation to help relax supine PROM to within protocol and pain tolerance- with ROM STM to the right upper trap, neck and right upper arm, some scar mobilization Isometrics supine  VASO 15 min medium pressure after    01/03/23 UBE L 1 2 min fwd/2 min backward- PTA assist Finger ladder flex and abd 5 x each Active hitchhiker and upper cuts 10 x each Gentle joint distraction and oscillation to help relax supine PROM to within protocol and pain tolerance STM to the right upper trap, neck and right upper arm, some scar mobilization Isometrics supine  VASO 15 min medium pressure after   12/29/22 Gentle joint distraction and oscillation to help relax supine PROM to within protocol and pain tolerance STM to the right upper trap, neck and right upper arm, some scar mobilization Isometrics supine  VASO 15 min medium pressure after  12/27/22 Gentle joint distraction and oscillation to help relax supine PROM to within protocol and pain tolerance STM to the right upper trap, neck and right upper arm, some scar mobilization Isometrics supine  Standing ball rolling on mat VASO 15 min medium pressure after  12/22/22 Gentle joint distraction and oscillation to help relax supine PROM to within protocol and pain tolerance STM to the right upper trap, neck and right upper arm, some scar mobilization Supine chest press AAROM Supine isometric circles Gentle isometrics circles Vaso medium pressure 34 degrees F  12/20/22 Weight 189# Vaso medium pressure 34 degree F right shoulder   PATIENT EDUCATION: Education details: poc/hep Person educated: Patient Education method: Programmer, multimedia, Facilities manager, Actor cues, Verbal cues, and Handouts Education comprehension: verbalized understanding  HOME EXERCISE PROGRAM: Access Code: [email or web address] URL: [email or web address] Date: 12/20/2022 Prepared by: Stacie Glaze  Exercises - Seated Shoulder Flexion Towel Slide at  Table Top  - 2 x daily - 7 x weekly - 2 sets - 10 reps - 3 hold - Seated Shoulder External Rotation PROM on Table  - 2 x daily - 7 x weekly - 2 sets - 10 reps - 3 hold - Seated Elbow Extension and Shoulder External Rotation AAROM at Table with Towel  - 2 x daily - 7 x weekly - 2 sets - 10 reps - 3 hold - Circular Shoulder Pendulum with Table Support  - 2 x daily - 7 x weekly - 2 sets - 10 reps - 3 hold  ASSESSMENT:  CLINICAL IMPRESSION:  pt arrived very sore from overdoing again.Progressed per protocol with some advancement. PROM and stretching per protocol- very guarded and pain limited. TP left UT  OBJECTIVE IMPAIRMENTS: cardiopulmonary status limiting activity, decreased activity tolerance, decreased endurance, decreased ROM, decreased strength, increased edema, increased muscle spasms, impaired flexibility, impaired UE functional use, improper body mechanics, postural dysfunction, and pain .  REHAB POTENTIAL: Good  CLINICAL DECISION MAKING: Evolving/moderate complexity  EVALUATION COMPLEXITY: Low   GOALS: Goals reviewed with patient? Yes  SHORT TERM GOALS: Target date: 01/01/23  Independent with initial HEP Goal status: 12/27/22 MET  LONG TERM GOALS: Target date: 03/22/23  Decrease pain 50% Goal status: 01/05/23 progressing  2.  Dress without difficulty Goal status: INITIAL  3.  Do hair without difficulty Goal status: INITIAL  4.  Increase AROM right shoulder flexion to 130 degrees Goal status: 01/05/23 progressing  5.  Increase right shoulder ER to 60 degrees Goal status: INITIAL  6.  Return to water aerobics and or gym activity Goal status: INITIAL  PLAN:  PT FREQUENCY: 1-2x/week  PT DURATION: 12 weeks  PLANNED INTERVENTIONS: Therapeutic exercises, Therapeutic activity, Neuromuscular re-education, Balance training, Gait training, Patient/Family education, Self Care, Joint mobilization, Dry Needling, Electrical stimulation, Cryotherapy, Vasopneumatic device, and  Manual therapy  PLAN FOR NEXT SESSION: Start protocol which is in the chart surgery was 11/10/22, NO BEHIND the BACK motions for 8 weeks   Ellamarie Naeve,ANGIE, PTA 01/10/2023, 7:58 AM [facility] at [facility] [place], [place], [address] Phone: [phone]   Fax:  [phone]  [place], [place], [address] Phone: [phone]   Fax:  [phone]  Patient Details  Name: LANASIA PYTLIK MRN: [id number] Date of Birth: 1955-11-23 Referring Provider:  Francena Hanly, MD [facility] at [facility]. [place], [place], [address] Phone: [phone]   Fax:  [phone]

## 2023-01-12 ENCOUNTER — Ambulatory Visit: Payer: Medicare Other | Admitting: Physical Therapy

## 2023-01-12 ENCOUNTER — Encounter: Payer: Self-pay | Admitting: Physical Therapy

## 2023-01-12 DIAGNOSIS — M25511 Pain in right shoulder: Secondary | ICD-10-CM | POA: Diagnosis not present

## 2023-01-12 DIAGNOSIS — M25611 Stiffness of right shoulder, not elsewhere classified: Secondary | ICD-10-CM

## 2023-01-12 DIAGNOSIS — M542 Cervicalgia: Secondary | ICD-10-CM

## 2023-01-12 DIAGNOSIS — R600 Localized edema: Secondary | ICD-10-CM

## 2023-01-12 NOTE — Therapy (Signed)
OUTPATIENT PHYSICAL THERAPY SHOULDER   Patient Name: Veronica Galvan MRN: [id number] DOB:1955/05/23, 67 y.o., female Today's Date: 01/12/2023  END OF SESSION:  PT End of Session - 01/12/23 0844     Visit Number 8    Date for PT Re-Evaluation 03/22/23    Authorization Type [employer] [place]    PT Start Time [phone]    PT Stop Time 0930    PT Time Calculation (min) 48 min    Activity Tolerance Patient tolerated treatment well    Behavior During Therapy [facility] for tasks assessed/performed             Past Medical History:  Diagnosis Date   Allergy    Anxiety    Arthritis    hands   Cataract    Diabetes mellitus without complication (HCC)    type 2   Family history of adverse reaction to anesthesia    son has malignant hyperthermia, daughter does not daughter recently had c section without problems   GERD (gastroesophageal reflux disease)    Headache    sinus   Hyperlipidemia    Hypertension    PONV (postoperative nausea and vomiting)    nausea only   Ulcer, stomach peptic yrs ago   Past Surgical History:  Procedure Laterality Date   APPENDECTOMY     both hells bone spur repair     both heels with metal clips   both shoulder rotator cuff repair     CESAREAN SECTION     x 1   COLONOSCOPY WITH PROPOFOL N/A 09/07/2016   Procedure: COLONOSCOPY WITH PROPOFOL;  Surgeon: Charolett Bumpers, MD;  Location: [facility];  Service: Endoscopy;  Laterality: N/A;   colonscopy  06/2011   polyps   EYE SURGERY     FRACTURE SURGERY     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/10/2022   Procedure: REVERSE SHOULDER ARTHROPLASTY;  Surgeon: Francena Hanly, MD;  Location: [facility];  Service: Orthopedics;  Laterality: Right;  Please follow in room 6 if able   TUBAL LIGATION     VESICO-VAGINAL FISTULA REPAIR     Patient Active Problem List   Diagnosis Date Noted   Gastroesophageal reflux disease 11/16/2021   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 08/18/2021    Dermatofibroma 08/18/2021   History of malignant neoplasm of skin 08/18/2021   Lentigo 08/18/2021   Melanocytic nevi of trunk 08/18/2021   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   Rosacea 08/18/2021   Actinic keratosis 08/18/2021   Sensorineural hearing loss (SNHL) of left ear with unrestricted hearing of right ear 07/26/2021   Tinnitus of left ear 07/26/2021   Acute recurrent maxillary sinusitis 06/22/2021   Primary hypertension 01/12/2021   Hyperlipidemia associated with type 2 diabetes mellitus (HCC) 01/12/2021   Arthritis 01/12/2021   Type II diabetes mellitus (HCC) 11/25/2019   Ingrown toenail 09/05/2018   Neck pain 01/29/2018   Tick bite 10/24/2017    PCP: Dayton Scrape, [name]  REFERRING PROVIDER: Rennis Chris, MD  REFERRING DIAG: s/p right reverse TSA  THERAPY DIAG:  Acute pain of right shoulder  Stiffness of right shoulder, not elsewhere classified  Localized edema  Cervicalgia  Rationale for Evaluation and Treatment: Rehabilitation  ONSET DATE: 11/05/22  SUBJECTIVE:  SUBJECTIVE STATEMENT:  I am at 9 weeks today from surgery, doing okay, still stiff, I tried to shift the gear in the car and it caused pain up to 10/10 PERTINENT HISTORY: See above  PAIN:  Are you having pain? Yes: NPRS scale: 7/10 Pain location: right shoulder Pain description: dull ache at rest, sharp with motions Aggravating factors: quick motions, movements pain up to 10/10 Relieving factors: ice, rest, Tylenol at best 3/10  PRECAUTIONS: Shoulder Protocol in the chart  RED FLAGS: None   WEIGHT BEARING RESTRICTIONS: No  FALLS:  Has patient fallen in last 6 months? Yes. Number of falls 1  LIVING ENVIRONMENT: Lives with: lives alone Lives in: House/apartment Stairs: No Has following equipment at home:  None  OCCUPATION: retired  PLOF: Independent  PATIENT GOALS:dress without difficulty, do hair, have good ROM and less pain  NEXT MD VISIT:   OBJECTIVE:   DIAGNOSTIC FINDINGS:  See above  PATIENT SURVEYS:  FOTO 21  COGNITION: Overall cognitive status: Within functional limits for tasks assessed     SENSATION: WFL  POSTURE: Fwd head, rounded shoulders, elevated and guarded shoulder  UPPER EXTREMITY ROM:   Active ROM Right PROM eval Right PROM Supine 01/05/23  Shoulder flexion 35  118  Shoulder extension 5    Shoulder abduction 20  90  Shoulder adduction     Shoulder internal rotation 15    Shoulder external rotation 20  45  Elbow flexion 120    Elbow extension 5    Wrist flexion     Wrist extension     Wrist ulnar deviation     Wrist radial deviation     Wrist pronation     Wrist supination     (Blank rows = not tested)  UPPER EXTREMITY MMT:  No tested due to recent surgery  MMT Right eval Left eval  Shoulder flexion    Shoulder extension    Shoulder abduction    Shoulder adduction    Shoulder internal rotation    Shoulder external rotation    Middle trapezius    Lower trapezius    Elbow flexion    Elbow extension    Wrist flexion    Wrist extension    Wrist ulnar deviation    Wrist radial deviation    Wrist pronation    Wrist supination    Grip strength (lbs)    (Blank rows = not tested)  PALPATION:  Very tight and tender in the pectoral, upper trap, the entire right upper arm   TODAY'S TREATMENT:                                                                                                                                         DATE:  01/12/23 Nustep level 3 x 6 minutes Walk with having her focus on natural arm swing and not holing in a sling position Wall slides and  circles with assist 3# bicep curls 1# stick supine chest press PROM supine gentle distraction, flexion , abduction and ER, some passive wrist and elbow stretches STM to  the right biceps and pectoral area Vaso medium pressure right shoulder  01/10/23 Nustep L 3 Finger ladder flex and abd 5 x each Cane AA flex ,abd,ER and chest press 10 x each UBE L 1 2 min each way Gentle joint distraction and oscillation to help relax supine PROM to within protocol and pain tolerance- with ROM STM to the right upper trap, neck and right upper arm, some scar mobilization Isometrics supine  VASO 15 min medium pressure after   01/05/23 UBE L 1 2.5 min fwd/2.5 min backward- compensation,backward easier Ball rolling on mat  Gentle joint distraction and oscillation to help relax supine PROM to within protocol and pain tolerance- with ROM STM to the right upper trap, neck and right upper arm, some scar mobilization Isometrics supine  VASO 15 min medium pressure after    01/03/23 UBE L 1 2 min fwd/2 min backward- PTA assist Finger ladder flex and abd 5 x each Active hitchhiker and upper cuts 10 x each Gentle joint distraction and oscillation to help relax supine PROM to within protocol and pain tolerance STM to the right upper trap, neck and right upper arm, some scar mobilization Isometrics supine  VASO 15 min medium pressure after   12/29/22 Gentle joint distraction and oscillation to help relax supine PROM to within protocol and pain tolerance STM to the right upper trap, neck and right upper arm, some scar mobilization Isometrics supine  VASO 15 min medium pressure after  12/27/22 Gentle joint distraction and oscillation to help relax supine PROM to within protocol and pain tolerance STM to the right upper trap, neck and right upper arm, some scar mobilization Isometrics supine  Standing ball rolling on mat VASO 15 min medium pressure after  12/22/22 Gentle joint distraction and oscillation to help relax supine PROM to within protocol and pain tolerance STM to the right upper trap, neck and right upper arm, some scar mobilization Supine chest press  AAROM Supine isometric circles Gentle isometrics circles Vaso medium pressure 34 degrees F  12/20/22 Weight 189# Vaso medium pressure 34 degree F right shoulder   PATIENT EDUCATION: Education details: poc/hep Person educated: Patient Education method: Programmer, multimedia, Facilities manager, Actor cues, Verbal cues, and Handouts Education comprehension: verbalized understanding  HOME EXERCISE PROGRAM: Access Code: [email or web address] URL: [email or web address] Date: 12/20/2022 Prepared by: Stacie Glaze  Exercises - Seated Shoulder Flexion Towel Slide at Table Top  - 2 x daily - 7 x weekly - 2 sets - 10 reps - 3 hold - Seated Shoulder External Rotation PROM on Table  - 2 x daily - 7 x weekly - 2 sets - 10 reps - 3 hold - Seated Elbow Extension and Shoulder External Rotation AAROM at Table with Towel  - 2 x daily - 7 x weekly - 2 sets - 10 reps - 3 hold - Circular Shoulder Pendulum with Table Support  - 2 x daily - 7 x weekly - 2 sets - 10 reps - 3 hold  ASSESSMENT:  CLINICAL IMPRESSION:  Starting week 9 post op.  She reports that she is still wearing the sling when out to avoid overuse or someone hugging her too hard, she is very tight in the bicep and the pectoral area, she tends to hold the arm in a sling position.  Has a very hard time relaxing and  is tending to use the upper trap some for shoulder elevation.    OBJECTIVE IMPAIRMENTS: cardiopulmonary status limiting activity, decreased activity tolerance, decreased endurance, decreased ROM, decreased strength, increased edema, increased muscle spasms, impaired flexibility, impaired UE functional use, improper body mechanics, postural dysfunction, and pain .  REHAB POTENTIAL: Good  CLINICAL DECISION MAKING: Evolving/moderate complexity  EVALUATION COMPLEXITY: Low   GOALS: Goals reviewed with patient? Yes  SHORT TERM GOALS: Target date: 01/01/23  Independent with initial HEP Goal status: 12/27/22 MET  LONG TERM GOALS: Target  date: 03/22/23  Decrease pain 50% Goal status: 01/05/23 progressing  2.  Dress without difficulty Goal status: progressing 01/12/23  3.  Do hair without difficulty Goal status: INITIAL  4.  Increase AROM right shoulder flexion to 130 degrees Goal status: 01/05/23 progressing  5.  Increase right shoulder ER to 60 degrees Goal status: INITIAL  6.  Return to water aerobics and or gym activity Goal status: INITIAL  PLAN:  PT FREQUENCY: 1-2x/week  PT DURATION: 12 weeks  PLANNED INTERVENTIONS: Therapeutic exercises, Therapeutic activity, Neuromuscular re-education, Balance training, Gait training, Patient/Family education, Self Care, Joint mobilization, Dry Needling, Electrical stimulation, Cryotherapy, Vasopneumatic device, and Manual therapy  PLAN FOR NEXT SESSION: starting week 9 today   Jearld Lesch, PT 01/12/2023, 8:45 AM [facility] at [facility]. [place], [place], [address] Phone: [phone]   Fax:  [phone]

## 2023-01-16 DIAGNOSIS — Z96611 Presence of right artificial shoulder joint: Secondary | ICD-10-CM | POA: Diagnosis not present

## 2023-01-17 ENCOUNTER — Ambulatory Visit (INDEPENDENT_AMBULATORY_CARE_PROVIDER_SITE_OTHER): Payer: Medicare Other | Admitting: Family

## 2023-01-17 ENCOUNTER — Ambulatory Visit: Payer: Medicare Other | Admitting: Physical Therapy

## 2023-01-17 ENCOUNTER — Encounter: Payer: Self-pay | Admitting: Physical Therapy

## 2023-01-17 VITALS — BP 122/76 | HR 86 | Resp 19 | Ht 61.0 in | Wt 191.8 lb

## 2023-01-17 DIAGNOSIS — R600 Localized edema: Secondary | ICD-10-CM | POA: Diagnosis not present

## 2023-01-17 DIAGNOSIS — E119 Type 2 diabetes mellitus without complications: Secondary | ICD-10-CM | POA: Diagnosis not present

## 2023-01-17 DIAGNOSIS — Z7985 Long-term (current) use of injectable non-insulin antidiabetic drugs: Secondary | ICD-10-CM

## 2023-01-17 DIAGNOSIS — M25511 Pain in right shoulder: Secondary | ICD-10-CM

## 2023-01-17 DIAGNOSIS — M25611 Stiffness of right shoulder, not elsewhere classified: Secondary | ICD-10-CM | POA: Diagnosis not present

## 2023-01-17 DIAGNOSIS — M542 Cervicalgia: Secondary | ICD-10-CM | POA: Diagnosis not present

## 2023-01-17 DIAGNOSIS — Z23 Encounter for immunization: Secondary | ICD-10-CM

## 2023-01-17 LAB — CBC WITH DIFFERENTIAL/PLATELET
Basophils Absolute: 0.1 10*3/uL (ref 0.0–0.1)
Basophils Relative: 0.8 % (ref 0.0–3.0)
Eosinophils Absolute: 0.6 10*3/uL (ref 0.0–0.7)
Eosinophils Relative: 8.8 % — ABNORMAL HIGH (ref 0.0–5.0)
HCT: 42.5 % (ref 36.0–46.0)
Hemoglobin: 13.4 g/dL (ref 12.0–15.0)
Lymphocytes Relative: 33.7 % (ref 12.0–46.0)
Lymphs Abs: 2.3 10*3/uL (ref 0.7–4.0)
MCHC: 31.6 g/dL (ref 30.0–36.0)
MCV: 84.8 fl (ref 78.0–100.0)
Monocytes Absolute: 0.5 10*3/uL (ref 0.1–1.0)
Monocytes Relative: 6.7 % (ref 3.0–12.0)
Neutro Abs: 3.5 10*3/uL (ref 1.4–7.7)
Neutrophils Relative %: 50 % (ref 43.0–77.0)
Platelets: 315 10*3/uL (ref 150.0–400.0)
RBC: 5.01 Mil/uL (ref 3.87–5.11)
RDW: 16.1 % — ABNORMAL HIGH (ref 11.5–15.5)
WBC: 7 10*3/uL (ref 4.0–10.5)

## 2023-01-17 LAB — COMPREHENSIVE METABOLIC PANEL
ALT: 32 U/L (ref 0–35)
AST: 20 U/L (ref 0–37)
Albumin: 4.3 g/dL (ref 3.5–5.2)
Alkaline Phosphatase: 69 U/L (ref 39–117)
BUN: 13 mg/dL (ref 6–23)
CO2: 26 meq/L (ref 19–32)
Calcium: 9.8 mg/dL (ref 8.4–10.5)
Chloride: 101 meq/L (ref 96–112)
Creatinine, Ser: 0.49 mg/dL (ref 0.40–1.20)
GFR: 97.05 mL/min (ref >60.00)
Glucose, Bld: 109 mg/dL — ABNORMAL HIGH (ref 70–99)
Potassium: 4.6 meq/L (ref 3.5–5.1)
Sodium: 138 meq/L (ref 135–145)
Total Bilirubin: 0.4 mg/dL (ref 0.2–1.2)
Total Protein: 6.8 g/dL (ref 6.0–8.3)

## 2023-01-17 LAB — HEMOGLOBIN A1C: Hgb A1c MFr Bld: 6.7 % — ABNORMAL HIGH (ref 4.6–6.5)

## 2023-01-17 NOTE — Progress Notes (Signed)
Veronica Galvan is a 67 y.o. 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   Rosacea 08/18/2021   Actinic keratosis 08/18/2021   Sensorineural hearing loss (SNHL) of left ear with unrestricted hearing of right ear 07/26/2021   Tinnitus of left ear 07/26/2021   Acute recurrent maxillary sinusitis 06/22/2021   Primary hypertension 01/12/2021   Hyperlipidemia associated with type 2 diabetes mellitus (HCC) 01/12/2021   Arthritis 01/12/2021   Type II diabetes mellitus (HCC) 11/25/2019   Ingrown toenail 09/05/2018   Neck pain 01/29/2018   Tick bite 10/24/2017    Current Outpatient Medications  Medication Sig Dispense Refill   acetaminophen (TYLENOL) 650 MG CR tablet Take 1,300 mg by mouth every 8 (eight) hours as needed for pain.     ALPRAZolam (XANAX) 0.5 MG tablet Take 1 tablet (0.5 mg total) by mouth at bedtime as needed for anxiety. 30 tablet 0   Ascorbic Acid (VITAMIN C) 1000 MG tablet Take 1,000 mg by mouth every morning.     aspirin EC 81 MG tablet Take 81 mg by mouth every morning.     atorvastatin (LIPITOR) 40 MG tablet Take 1 tablet (40 mg total) by mouth every evening. 90 tablet 3   Calcium Carb-Cholecalciferol (CALCIUM 600+D3 PO) Take 1 tablet by mouth every morning.     calcium carbonate (TUMS - DOSED IN MG ELEMENTAL CALCIUM) 500 MG chewable tablet Chew 2 tablets by mouth daily as needed for indigestion or heartburn.     carboxymethylcellulose (REFRESH TEARS) 0.5 % SOLN Place 1 drop into both eyes 2 (two) times daily.     celecoxib (CELEBREX) 200 MG capsule TAKE 1 CAPSULE BY MOUTH DAILY 90 capsule 3    Coenzyme Q10 300 MG CAPS Take 1 capsule by mouth every morning.     diclofenac Sodium (VOLTAREN) 1 % GEL Apply 1 Application topically 2 (two) times daily.     glucose blood test strip Use as instructed 100 each 12   levocetirizine (XYZAL) 5 MG tablet Take 1 tablet (5 mg total) by mouth every evening. 90 tablet 3   lisinopril (ZESTRIL) 5 MG tablet Take 1 tablet (5 mg total) by mouth 2 (two) times daily. 180 tablet 3   LUTEIN-ZEAXANTHIN PO Take 1 tablet by mouth every morning.     MAGNESIUM CITRATE PO Take 500 mg by mouth daily.     Multiple Vitamins-Minerals (MULTIVITAMIN WITH MINERALS) tablet Take 1 tablet by mouth every morning.     Omega-3 Fatty Acids (FISH OIL) 1000 MG CAPS Take 2,000 mg by mouth daily.     omeprazole (PRILOSEC) 20 MG capsule Take 1 capsule (20 mg total) by mouth every morning. 90 capsule 3   Semaglutide, 2 MG/DOSE, 8 MG/3ML SOPN Inject 2 mg as directed once a week. 9 mL 0   sodium chloride (MURO 128) 2 % ophthalmic solution Place 1 drop into both eyes 2 (two) times daily.     SYNJARDY XR 12.08-998 MG TB24 Take 1 tablet by mouth 2 (two) times daily. 180 tablet 3   TURMERIC PO Take 2,000 mg by mouth daily.     UNABLE TO FIND Take 1 tablet by mouth  daily. Cinsulin supplement     vitamin E 180 MG (400 UNITS) capsule Take 400 Units by mouth daily.     nystatin-triamcinolone (MYCOLOG II) cream Apply 1 Application topically 2 (two) times daily. (Patient not taking: Reported on 01/17/2023) 60 g 0   ondansetron (ZOFRAN) 4 MG tablet Take 1 tablet (4 mg total) by mouth every 8 (eight) hours as needed for nausea or vomiting. (Patient not taking: Reported on 01/17/2023) 10 tablet 0   traMADol (ULTRAM) 50 MG tablet Take 1 tablet (50 mg total) by mouth every 6 (six) hours as needed for moderate pain. (Patient not taking: Reported on 01/17/2023) 30 tablet 0   No current facility-administered medications for this visit.    Allergies: Codeine, Benzonatate, and Epinephrine  Past Medical  History:  Diagnosis Date   Allergy    Anxiety    Arthritis    hands   Cataract    Diabetes mellitus without complication (HCC)    type 2   Family history of adverse reaction to anesthesia    son has malignant hyperthermia, daughter does not daughter recently had c section without problems   GERD (gastroesophageal reflux disease)    Headache    sinus   Hyperlipidemia    Hypertension    PONV (postoperative nausea and vomiting)    nausea only   Ulcer, stomach peptic yrs ago    Past Surgical History:  Procedure Laterality Date   APPENDECTOMY     both hells bone spur repair     both heels with metal clips   both shoulder rotator cuff repair     CESAREAN SECTION     x 1   COLONOSCOPY WITH PROPOFOL N/A 09/07/2016   Procedure: COLONOSCOPY WITH PROPOFOL;  Surgeon: Charolett Bumpers, MD;  Location: [facility];  Service: Endoscopy;  Laterality: N/A;   colonscopy  06/2011   polyps   EYE SURGERY     FRACTURE SURGERY     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/10/2022   Procedure: REVERSE SHOULDER ARTHROPLASTY;  Surgeon: Francena Hanly, MD;  Location: [facility];  Service: Orthopedics;  Laterality: Right;  Please follow in room 6 if able   TUBAL LIGATION     VESICO-VAGINAL FISTULA REPAIR      Family History  Problem Relation Age of Onset   Hypertension Mother    COPD Mother    Cancer Mother    Arthritis Mother    Hypertension Father    Hyperlipidemia Father    Diabetes Father    Cancer Father    Alcohol abuse Father    Diabetes Brother    Alcohol abuse Brother    Hypertension Brother    Arthritis Maternal Grandmother    Birth defects Maternal Grandmother    Diabetes Paternal Grandmother    Heart disease Paternal Grandfather    Diabetes Paternal Aunt    Diabetes Paternal Aunt    Obesity Son     Social History   Tobacco Use   Smoking status: Former    Current packs/day: 1.00    Average packs/day: 1 pack/day for 14.0 years (14.0 ttl pk-yrs)    Types: Cigarettes    Smokeless tobacco: Never   Tobacco comments:    quit 31 yrs ago  Substance Use Topics   Alcohol use: Yes    Comment: rare    Subjective:   4 month follow up for diabetes follow up; has been released by orthopedics- has reached 10 week mark and released to do her normal activities;  optimistic about returning to exercise/ "normal life." Denies any chest pain, shortness of breath, blurred vision or headache. Would like to get flu shot today;     Objective:  Vitals:   01/17/23 0855  BP: 122/76  Pulse: 86  Resp: 19  SpO2: 96%  Weight: 191 lb 12.8 oz (87 kg)  Height: 5\' 1"  (1.549 m)    General: Well developed, well nourished, in no acute distress  Skin : Warm and dry.  Head: Normocephalic and atraumatic  Lungs: Respirations unlabored; clear to auscultation bilaterally without wheeze, rales, rhonchi  CVS exam: normal rate and regular rhythm.  Abdomen: Soft; nontender; nondistended; normoactive bowel sounds; no masses or hepatosplenomegaly  Neurologic: Alert and oriented; speech intact; face symmetrical; moves all extremities well; CNII-XII intact without focal deficit   Assessment:  1. Type 2 diabetes mellitus without complication, without long-term current use of insulin (HCC)   2. Long-term current use of injectable noninsulin antidiabetic medication   3. Need for influenza vaccination     Plan:  Update labs for Type 2 Diabetes; plan to follow up in 4 months;  Flu shot updated;   No follow-ups on file.  Orders Placed This Encounter  Procedures   Flu Vaccine Trivalent High Dose (Fluad)   CBC with Differential/Platelet   Comp Met (CMET)   Hemoglobin A1c    Requested Prescriptions    No prescriptions requested or ordered in this encounter

## 2023-01-17 NOTE — Therapy (Signed)
OUTPATIENT PHYSICAL THERAPY SHOULDER   Patient Name: Veronica Galvan MRN: [id number] DOB:Sep 12, 1955, 67 y.o., female Today's Date: 01/17/2023  END OF SESSION:  PT End of Session - 01/17/23 1356     Visit Number 9    Date for PT Re-Evaluation 03/22/23    Authorization Type [employer] [place]    PT Start Time 1356    PT Stop Time 1444    PT Time Calculation (min) 48 min    Activity Tolerance Patient tolerated treatment well    Behavior During Therapy WFL for tasks assessed/performed             Past Medical History:  Diagnosis Date   Allergy    Anxiety    Arthritis    hands   Cataract    Diabetes mellitus without complication (HCC)    type 2   Family history of adverse reaction to anesthesia    son has malignant hyperthermia, daughter does not daughter recently had c section without problems   GERD (gastroesophageal reflux disease)    Headache    sinus   Hyperlipidemia    Hypertension    PONV (postoperative nausea and vomiting)    nausea only   Ulcer, stomach peptic yrs ago   Past Surgical History:  Procedure Laterality Date   APPENDECTOMY     both hells bone spur repair     both heels with metal clips   both shoulder rotator cuff repair     CESAREAN SECTION     x 1   COLONOSCOPY WITH PROPOFOL N/A 09/07/2016   Procedure: COLONOSCOPY WITH PROPOFOL;  Surgeon: Charolett Bumpers, MD;  Location: [facility];  Service: Endoscopy;  Laterality: N/A;   colonscopy  06/2011   polyps   EYE SURGERY     FRACTURE SURGERY     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/10/2022   Procedure: REVERSE SHOULDER ARTHROPLASTY;  Surgeon: Francena Hanly, MD;  Location: [facility];  Service: Orthopedics;  Laterality: Right;  Please follow in room 6 if able   TUBAL LIGATION     VESICO-VAGINAL FISTULA REPAIR     Patient Active Problem List   Diagnosis Date Noted   Gastroesophageal reflux disease 11/16/2021   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 08/18/2021    Dermatofibroma 08/18/2021   History of malignant neoplasm of skin 08/18/2021   Lentigo 08/18/2021   Melanocytic nevi of trunk 08/18/2021   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   Rosacea 08/18/2021   Actinic keratosis 08/18/2021   Sensorineural hearing loss (SNHL) of left ear with unrestricted hearing of right ear 07/26/2021   Tinnitus of left ear 07/26/2021   Acute recurrent maxillary sinusitis 06/22/2021   Primary hypertension 01/12/2021   Hyperlipidemia associated with type 2 diabetes mellitus (HCC) 01/12/2021   Arthritis 01/12/2021   Type II diabetes mellitus (HCC) 11/25/2019   Ingrown toenail 09/05/2018   Neck pain 01/29/2018   Tick bite 10/24/2017    PCP: Dayton Scrape, [name]  REFERRING PROVIDER: Rennis Chris, MD  REFERRING DIAG: s/p right reverse TSA  THERAPY DIAG:  Acute pain of right shoulder  Stiffness of right shoulder, not elsewhere classified  Localized edema  Cervicalgia  Rationale for Evaluation and Treatment: Rehabilitation  ONSET DATE: 11/05/22  SUBJECTIVE:  SUBJECTIVE STATEMENT:   Patient will start week 10 on Thursday, saw the MD yesterday and they feel I have healed very well, still limited ROM PERTINENT HISTORY: See above  PAIN:  Are you having pain? Yes: NPRS scale: 6/10 Pain location: right shoulder Pain description: dull ache at rest, sharp with motions Aggravating factors: quick motions, movements pain up to 10/10 Relieving factors: ice, rest, Tylenol at best 3/10  PRECAUTIONS: Shoulder Protocol in the chart  RED FLAGS: None   WEIGHT BEARING RESTRICTIONS: No  FALLS:  Has patient fallen in last 6 months? Yes. Number of falls 1  LIVING ENVIRONMENT: Lives with: lives alone Lives in: House/apartment Stairs: No Has following equipment at home:  None  OCCUPATION: retired  PLOF: Independent  PATIENT GOALS:dress without difficulty, do hair, have good ROM and less pain  NEXT MD VISIT:   OBJECTIVE:   DIAGNOSTIC FINDINGS:  See above  PATIENT SURVEYS:  FOTO 21  COGNITION: Overall cognitive status: Within functional limits for tasks assessed     SENSATION: WFL  POSTURE: Fwd head, rounded shoulders, elevated and guarded shoulder  UPPER EXTREMITY ROM:   Active ROM Right PROM eval Right PROM Supine 01/05/23  Shoulder flexion 35  118  Shoulder extension 5    Shoulder abduction 20  90  Shoulder adduction     Shoulder internal rotation 15    Shoulder external rotation 20  45  Elbow flexion 120    Elbow extension 5    Wrist flexion     Wrist extension     Wrist ulnar deviation     Wrist radial deviation     Wrist pronation     Wrist supination     (Blank rows = not tested)  UPPER EXTREMITY MMT:  No tested due to recent surgery  MMT Right eval Left eval  Shoulder flexion    Shoulder extension    Shoulder abduction    Shoulder adduction    Shoulder internal rotation    Shoulder external rotation    Middle trapezius    Lower trapezius    Elbow flexion    Elbow extension    Wrist flexion    Wrist extension    Wrist ulnar deviation    Wrist radial deviation    Wrist pronation    Wrist supination    Grip strength (lbs)    (Blank rows = not tested)  PALPATION:  Very tight and tender in the pectoral, upper trap, the entire right upper arm   TODAY'S TREATMENT:                                                                                                                                         DATE:  01/17/23 Walk around the back building cues for arm swing Nustep level 5 x 5 minutes Red tband row and extension Red tband ER/IR with assist Wall slides and circles with  assist Ball vs wall Supine punches, isometric circles PROM, gentle distraction, gentle C/R Vaso medium pressure  01/12/23 Nustep  level 3 x 6 minutes Walk with having her focus on natural arm swing and not holing in a sling position Wall slides and circles with assist 3# bicep curls 1# stick supine chest press PROM supine gentle distraction, flexion , abduction and ER, some passive wrist and elbow stretches STM to the right biceps and pectoral area Vaso medium pressure right shoulder  01/10/23 Nustep L 3 Finger ladder flex and abd 5 x each Cane AA flex ,abd,ER and chest press 10 x each UBE L 1 2 min each way Gentle joint distraction and oscillation to help relax supine PROM to within protocol and pain tolerance- with ROM STM to the right upper trap, neck and right upper arm, some scar mobilization Isometrics supine  VASO 15 min medium pressure after   01/05/23 UBE L 1 2.5 min fwd/2.5 min backward- compensation,backward easier Ball rolling on mat  Gentle joint distraction and oscillation to help relax supine PROM to within protocol and pain tolerance- with ROM STM to the right upper trap, neck and right upper arm, some scar mobilization Isometrics supine  VASO 15 min medium pressure after    01/03/23 UBE L 1 2 min fwd/2 min backward- PTA assist Finger ladder flex and abd 5 x each Active hitchhiker and upper cuts 10 x each Gentle joint distraction and oscillation to help relax supine PROM to within protocol and pain tolerance STM to the right upper trap, neck and right upper arm, some scar mobilization Isometrics supine  VASO 15 min medium pressure after   12/29/22 Gentle joint distraction and oscillation to help relax supine PROM to within protocol and pain tolerance STM to the right upper trap, neck and right upper arm, some scar mobilization Isometrics supine  VASO 15 min medium pressure after  12/27/22 Gentle joint distraction and oscillation to help relax supine PROM to within protocol and pain tolerance STM to the right upper trap, neck and right upper arm, some scar  mobilization Isometrics supine  Standing ball rolling on mat VASO 15 min medium pressure after  12/22/22 Gentle joint distraction and oscillation to help relax supine PROM to within protocol and pain tolerance STM to the right upper trap, neck and right upper arm, some scar mobilization Supine chest press AAROM Supine isometric circles Gentle isometrics circles Vaso medium pressure 34 degrees F  PATIENT EDUCATION: Education details: poc/hep Person educated: Patient Education method: Programmer, multimedia, Facilities manager, Actor cues, Verbal cues, and Handouts Education comprehension: verbalized understanding  HOME EXERCISE PROGRAM: Access Code: [email or web address] URL: [email or web address] Date: 12/20/2022 Prepared by: Stacie Glaze  Exercises - Seated Shoulder Flexion Towel Slide at Table Top  - 2 x daily - 7 x weekly - 2 sets - 10 reps - 3 hold - Seated Shoulder External Rotation PROM on Table  - 2 x daily - 7 x weekly - 2 sets - 10 reps - 3 hold - Seated Elbow Extension and Shoulder External Rotation AAROM at Table with Towel  - 2 x daily - 7 x weekly - 2 sets - 10 reps - 3 hold - Circular Shoulder Pendulum with Table Support  - 2 x daily - 7 x weekly - 2 sets - 10 reps - 3 hold  ASSESSMENT:  CLINICAL IMPRESSION:  Patient saw the MD yesterday, reports very good healing,  She seems to be able to allow a little more PROM today, still very guarded  and needs a lot of cues, starting to try to get good flexion and abduction with passive motions  OBJECTIVE IMPAIRMENTS: cardiopulmonary status limiting activity, decreased activity tolerance, decreased endurance, decreased ROM, decreased strength, increased edema, increased muscle spasms, impaired flexibility, impaired UE functional use, improper body mechanics, postural dysfunction, and pain .  REHAB POTENTIAL: Good  CLINICAL DECISION MAKING: Evolving/moderate complexity  EVALUATION COMPLEXITY: Low   GOALS: Goals reviewed with  patient? Yes  SHORT TERM GOALS: Target date: 01/01/23  Independent with initial HEP Goal status: 12/27/22 MET  LONG TERM GOALS: Target date: 03/22/23  Decrease pain 50% Goal status: 01/05/23 progressing  2.  Dress without difficulty Goal status: progressing 01/12/23  3.  Do hair without difficulty Goal status: INITIAL  4.  Increase AROM right shoulder flexion to 130 degrees Goal status: 01/05/23 progressing  5.  Increase right shoulder ER to 60 degrees Goal status: INITIAL  6.  Return to water aerobics and or gym activity Goal status: INITIAL  PLAN:  PT FREQUENCY: 1-2x/week  PT DURATION: 12 weeks  PLANNED INTERVENTIONS: Therapeutic exercises, Therapeutic activity, Neuromuscular re-education, Balance training, Gait training, Patient/Family education, Self Care, Joint mobilization, Dry Needling, Electrical stimulation, Cryotherapy, Vasopneumatic device, and Manual therapy  PLAN FOR NEXT SESSION: may address the mm tightness next visit   Jearld Lesch, PT 01/17/2023, 1:57 PM [facility] [address] [facility]. [place], [place], [address] Phone: [phone]   Fax:  [phone]

## 2023-01-18 ENCOUNTER — Ambulatory Visit: Payer: Medicare Other | Admitting: Physical Therapy

## 2023-01-18 ENCOUNTER — Encounter: Payer: Self-pay | Admitting: Physical Therapy

## 2023-01-18 DIAGNOSIS — M25511 Pain in right shoulder: Secondary | ICD-10-CM

## 2023-01-18 DIAGNOSIS — M542 Cervicalgia: Secondary | ICD-10-CM | POA: Diagnosis not present

## 2023-01-18 DIAGNOSIS — M25611 Stiffness of right shoulder, not elsewhere classified: Secondary | ICD-10-CM

## 2023-01-18 DIAGNOSIS — R600 Localized edema: Secondary | ICD-10-CM | POA: Diagnosis not present

## 2023-01-18 NOTE — Therapy (Signed)
OUTPATIENT PHYSICAL THERAPY SHOULDER Progress Note Reporting Period 12/20/22 to 01/18/23  See note below for Objective Data and Assessment of Progress/Goals.      Patient Name: Veronica Galvan MRN: [id number] DOB:02-15-1956, 67 y.o., female Today's Date: 01/18/2023  END OF SESSION:  PT End of Session - 01/18/23 1529     Visit Number 10    Date for PT Re-Evaluation 03/22/23    Authorization Type [employer] [place]    PT Start Time 1526    PT Stop Time 1625    PT Time Calculation (min) 59 min    Activity Tolerance Patient tolerated treatment well    Behavior During Therapy WFL for tasks assessed/performed             Past Medical History:  Diagnosis Date   Allergy    Anxiety    Arthritis    hands   Cataract    Diabetes mellitus without complication (HCC)    type 2   Family history of adverse reaction to anesthesia    son has malignant hyperthermia, daughter does not daughter recently had c section without problems   GERD (gastroesophageal reflux disease)    Headache    sinus   Hyperlipidemia    Hypertension    PONV (postoperative nausea and vomiting)    nausea only   Ulcer, stomach peptic yrs ago   Past Surgical History:  Procedure Laterality Date   APPENDECTOMY     both hells bone spur repair     both heels with metal clips   both shoulder rotator cuff repair     CESAREAN SECTION     x 1   COLONOSCOPY WITH PROPOFOL N/A 09/07/2016   Procedure: COLONOSCOPY WITH PROPOFOL;  Surgeon: Charolett Bumpers, MD;  Location: [facility];  Service: Endoscopy;  Laterality: N/A;   colonscopy  06/2011   polyps   EYE SURGERY     FRACTURE SURGERY     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/10/2022   Procedure: REVERSE SHOULDER ARTHROPLASTY;  Surgeon: Francena Hanly, MD;  Location: [facility];  Service: Orthopedics;  Laterality: Right;  Please follow in room 6 if able   TUBAL LIGATION     VESICO-VAGINAL FISTULA REPAIR     Patient Active Problem List   Diagnosis Date Noted    Gastroesophageal reflux disease 11/16/2021   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 08/18/2021   Dermatofibroma 08/18/2021   History of malignant neoplasm of skin 08/18/2021   Lentigo 08/18/2021   Melanocytic nevi of trunk 08/18/2021   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   Rosacea 08/18/2021   Actinic keratosis 08/18/2021   Sensorineural hearing loss (SNHL) of left ear with unrestricted hearing of right ear 07/26/2021   Tinnitus of left ear 07/26/2021   Acute recurrent maxillary sinusitis 06/22/2021   Primary hypertension 01/12/2021   Hyperlipidemia associated with type 2 diabetes mellitus (HCC) 01/12/2021   Arthritis 01/12/2021   Type II diabetes mellitus (HCC) 11/25/2019   Ingrown toenail 09/05/2018   Neck pain 01/29/2018   Tick bite 10/24/2017    PCP: Dayton Scrape, [name]  REFERRING PROVIDER: Rennis Chris, MD  REFERRING DIAG: s/p right reverse TSA  THERAPY DIAG:  Acute pain of right shoulder  Stiffness of right shoulder, not elsewhere classified  Localized edema  Cervicalgia  Rationale for Evaluation and Treatment: Rehabilitation  ONSET DATE: 11/05/22  SUBJECTIVE:  SUBJECTIVE STATEMENT:   Patient will start week 10 on Thursday, reports that she felt a little better after yesterdays treatment PERTINENT HISTORY: See above  PAIN:  Are you having pain? Yes: NPRS scale: 6/10 Pain location: right shoulder Pain description: dull ache at rest, sharp with motions Aggravating factors: quick motions, movements pain up to 10/10 Relieving factors: ice, rest, Tylenol at best 3/10  PRECAUTIONS: Shoulder Protocol in the chart  RED FLAGS: None   WEIGHT BEARING RESTRICTIONS: No  FALLS:  Has patient fallen in last 6 months? Yes. Number of falls 1  LIVING ENVIRONMENT: Lives  with: lives alone Lives in: House/apartment Stairs: No Has following equipment at home: None  OCCUPATION: retired  PLOF: Independent  PATIENT GOALS:dress without difficulty, do hair, have good ROM and less pain  NEXT MD VISIT:   OBJECTIVE:   DIAGNOSTIC FINDINGS:  See above  PATIENT SURVEYS:  FOTO 21  COGNITION: Overall cognitive status: Within functional limits for tasks assessed     SENSATION: WFL  POSTURE: Fwd head, rounded shoulders, elevated and guarded shoulder  UPPER EXTREMITY ROM:   Active ROM Right PROM eval Right PROM Supine 01/05/23  Shoulder flexion 35  118  Shoulder extension 5    Shoulder abduction 20  90  Shoulder adduction     Shoulder internal rotation 15    Shoulder external rotation 20  45  Elbow flexion 120    Elbow extension 5    Wrist flexion     Wrist extension     Wrist ulnar deviation     Wrist radial deviation     Wrist pronation     Wrist supination     (Blank rows = not tested)  UPPER EXTREMITY MMT:  No tested due to recent surgery  MMT Right eval Left eval  Shoulder flexion    Shoulder extension    Shoulder abduction    Shoulder adduction    Shoulder internal rotation    Shoulder external rotation    Middle trapezius    Lower trapezius    Elbow flexion    Elbow extension    Wrist flexion    Wrist extension    Wrist ulnar deviation    Wrist radial deviation    Wrist pronation    Wrist supination    Grip strength (lbs)    (Blank rows = not tested)  PALPATION:  Very tight and tender in the pectoral, upper trap, the entire right upper arm   TODAY'S TREATMENT:                                                                                                                                         DATE:  01/18/23 UBE with cues to decrease shoulder elevation x 5 minutes Red tband row and extension Shrugs PROM in supine and in sitting all motions to her tolerance STM to the right upper arm and the  right upper trap and  pectoral area Vaso medium pressure right sholder 35 degrees   01/17/23 Walk around the back building cues for arm swing Nustep level 5 x 5 minutes Red tband row and extension Red tband ER/IR with assist Wall slides and circles with assist Ball vs wall Supine punches, isometric circles PROM, gentle distraction, gentle C/R Vaso medium pressure  01/12/23 Nustep level 3 x 6 minutes Walk with having her focus on natural arm swing and not holing in a sling position Wall slides and circles with assist 3# bicep curls 1# stick supine chest press PROM supine gentle distraction, flexion , abduction and ER, some passive wrist and elbow stretches STM to the right biceps and pectoral area Vaso medium pressure right shoulder  01/10/23 Nustep L 3 Finger ladder flex and abd 5 x each Cane AA flex ,abd,ER and chest press 10 x each UBE L 1 2 min each way Gentle joint distraction and oscillation to help relax supine PROM to within protocol and pain tolerance- with ROM STM to the right upper trap, neck and right upper arm, some scar mobilization Isometrics supine  VASO 15 min medium pressure after   01/05/23 UBE L 1 2.5 min fwd/2.5 min backward- compensation,backward easier Ball rolling on mat  Gentle joint distraction and oscillation to help relax supine PROM to within protocol and pain tolerance- with ROM STM to the right upper trap, neck and right upper arm, some scar mobilization Isometrics supine  VASO 15 min medium pressure after    01/03/23 UBE L 1 2 min fwd/2 min backward- PTA assist Finger ladder flex and abd 5 x each Active hitchhiker and upper cuts 10 x each Gentle joint distraction and oscillation to help relax supine PROM to within protocol and pain tolerance STM to the right upper trap, neck and right upper arm, some scar mobilization Isometrics supine  VASO 15 min medium pressure after   12/29/22 Gentle joint distraction and oscillation to help relax supine PROM to  within protocol and pain tolerance STM to the right upper trap, neck and right upper arm, some scar mobilization Isometrics supine  VASO 15 min medium pressure after  12/27/22 Gentle joint distraction and oscillation to help relax supine PROM to within protocol and pain tolerance STM to the right upper trap, neck and right upper arm, some scar mobilization Isometrics supine  Standing ball rolling on mat VASO 15 min medium pressure after  12/22/22 Gentle joint distraction and oscillation to help relax supine PROM to within protocol and pain tolerance STM to the right upper trap, neck and right upper arm, some scar mobilization Supine chest press AAROM Supine isometric circles Gentle isometrics circles Vaso medium pressure 34 degrees F  PATIENT EDUCATION: Education details: poc/hep Person educated: Patient Education method: Programmer, multimedia, Facilities manager, Actor cues, Verbal cues, and Handouts Education comprehension: verbalized understanding  HOME EXERCISE PROGRAM: Access Code: [email or web address] URL: [email or web address] Date: 12/20/2022 Prepared by: Stacie Glaze  Exercises - Seated Shoulder Flexion Towel Slide at Table Top  - 2 x daily - 7 x weekly - 2 sets - 10 reps - 3 hold - Seated Shoulder External Rotation PROM on Table  - 2 x daily - 7 x weekly - 2 sets - 10 reps - 3 hold - Seated Elbow Extension and Shoulder External Rotation AAROM at Table with Towel  - 2 x daily - 7 x weekly - 2 sets - 10 reps - 3 hold - Circular Shoulder Pendulum with Table Support  - 2  x daily - 7 x weekly - 2 sets - 10 reps - 3 hold  ASSESSMENT:  CLINICAL IMPRESSION:  Patient needs cues to decrease shoulder elevation to move with better laxity and not be so guarded and so stiff.  She did very well with PROM today and allowed the ROM, she does have significant tightness in the upper trap and the right upper arm  OBJECTIVE IMPAIRMENTS: cardiopulmonary status limiting activity, decreased  activity tolerance, decreased endurance, decreased ROM, decreased strength, increased edema, increased muscle spasms, impaired flexibility, impaired UE functional use, improper body mechanics, postural dysfunction, and pain .  REHAB POTENTIAL: Good  CLINICAL DECISION MAKING: Evolving/moderate complexity  EVALUATION COMPLEXITY: Low   GOALS: Goals reviewed with patient? Yes  SHORT TERM GOALS: Target date: 01/01/23  Independent with initial HEP Goal status: 12/27/22 MET  LONG TERM GOALS: Target date: 03/22/23  Decrease pain 50% Goal status: 01/05/23 progressing  2.  Dress without difficulty Goal status: progressing 01/12/23  3.  Do hair without difficulty Goal status: INITIAL  4.  Increase AROM right shoulder flexion to 130 degrees Goal status: 01/05/23 progressing  5.  Increase right shoulder ER to 60 degrees Goal status: INITIAL  6.  Return to water aerobics and or gym activity Goal status: INITIAL  PLAN:  PT FREQUENCY: 1-2x/week  PT DURATION: 12 weeks  PLANNED INTERVENTIONS: Therapeutic exercises, Therapeutic activity, Neuromuscular re-education, Balance training, Gait training, Patient/Family education, Self Care, Joint mobilization, Dry Needling, Electrical stimulation, Cryotherapy, Vasopneumatic device, and Manual therapy  PLAN FOR NEXT SESSION: may address the mm tightness next visit   Jearld Lesch, PT 01/18/2023, 4:13 PM [facility] at [facility] [address]. [place], [place], [address] Phone: [phone]   Fax:  [phone]

## 2023-01-23 ENCOUNTER — Ambulatory Visit: Payer: Medicare Other | Admitting: Physical Therapy

## 2023-01-24 ENCOUNTER — Ambulatory Visit: Payer: Medicare Other | Admitting: Physical Therapy

## 2023-01-24 ENCOUNTER — Encounter: Payer: Self-pay | Admitting: Physical Therapy

## 2023-01-24 DIAGNOSIS — R600 Localized edema: Secondary | ICD-10-CM | POA: Diagnosis not present

## 2023-01-24 DIAGNOSIS — M542 Cervicalgia: Secondary | ICD-10-CM

## 2023-01-24 DIAGNOSIS — M25511 Pain in right shoulder: Secondary | ICD-10-CM | POA: Diagnosis not present

## 2023-01-24 DIAGNOSIS — M25611 Stiffness of right shoulder, not elsewhere classified: Secondary | ICD-10-CM | POA: Diagnosis not present

## 2023-01-24 NOTE — Therapy (Signed)
OUTPATIENT PHYSICAL THERAPY SHOULDER    Patient Name: Veronica Galvan MRN: [id number] DOB:Nov 09, 1955, 67 y.o., female Today's Date: 01/24/2023  END OF SESSION:  PT End of Session - 01/24/23 1529     Visit Number 11    Date for PT Re-Evaluation 03/22/23    Authorization Type [employer]    PT Start Time 1526    PT Stop Time 1612    PT Time Calculation (min) 46 min    Activity Tolerance Patient tolerated treatment well    Behavior During Therapy WFL for tasks assessed/performed             Past Medical History:  Diagnosis Date   Allergy    Anxiety    Arthritis    hands   Cataract    Diabetes mellitus without complication (HCC)    type 2   Family history of adverse reaction to anesthesia    son has malignant hyperthermia, daughter does not daughter recently had c section without problems   GERD (gastroesophageal reflux disease)    Headache    sinus   Hyperlipidemia    Hypertension    PONV (postoperative nausea and vomiting)    nausea only   Ulcer, stomach peptic yrs ago   Past Surgical History:  Procedure Laterality Date   APPENDECTOMY     both hells bone spur repair     both heels with metal clips   both shoulder rotator cuff repair     CESAREAN SECTION     x 1   COLONOSCOPY WITH PROPOFOL N/A 09/07/2016   Procedure: COLONOSCOPY WITH PROPOFOL;  Surgeon: Charolett Bumpers, MD;  Location: [facility];  Service: Endoscopy;  Laterality: N/A;   colonscopy  06/2011   polyps   EYE SURGERY     FRACTURE SURGERY     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/10/2022   Procedure: REVERSE SHOULDER ARTHROPLASTY;  Surgeon: Francena Hanly, MD;  Location: [facility];  Service: Orthopedics;  Laterality: Right;  Please follow in room 6 if able   TUBAL LIGATION     VESICO-VAGINAL FISTULA REPAIR     Patient Active Problem List   Diagnosis Date Noted   Gastroesophageal reflux disease 11/16/2021   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 08/18/2021    Dermatofibroma 08/18/2021   History of malignant neoplasm of skin 08/18/2021   Lentigo 08/18/2021   Melanocytic nevi of trunk 08/18/2021   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   Rosacea 08/18/2021   Actinic keratosis 08/18/2021   Sensorineural hearing loss (SNHL) of left ear with unrestricted hearing of right ear 07/26/2021   Tinnitus of left ear 07/26/2021   Acute recurrent maxillary sinusitis 06/22/2021   Primary hypertension 01/12/2021   Hyperlipidemia associated with type 2 diabetes mellitus (HCC) 01/12/2021   Arthritis 01/12/2021   Type II diabetes mellitus (HCC) 11/25/2019   Ingrown toenail 09/05/2018   Neck pain 01/29/2018   Tick bite 10/24/2017    PCP: Dayton Scrape, [name]  REFERRING PROVIDER: Rennis Chris, MD  REFERRING DIAG: s/p right reverse TSA  THERAPY DIAG:  Acute pain of right shoulder  Stiffness of right shoulder, not elsewhere classified  Localized edema  Cervicalgia  Rationale for Evaluation and Treatment: Rehabilitation  ONSET DATE: 11/05/22  SUBJECTIVE:  SUBJECTIVE STATEMENT:   My shoulder and upper trap just get so tight and ache, I tried to go to an exercise class yesterday PERTINENT HISTORY: See above  PAIN:  Are you having pain? Yes: NPRS scale: 6/10 Pain location: right shoulder Pain description: dull ache at rest, sharp with motions Aggravating factors: quick motions, movements pain up to 10/10 Relieving factors: ice, rest, Tylenol at best 3/10  PRECAUTIONS: Shoulder Protocol in the chart  RED FLAGS: None   WEIGHT BEARING RESTRICTIONS: No  FALLS:  Has patient fallen in last 6 months? Yes. Number of falls 1  LIVING ENVIRONMENT: Lives with: lives alone Lives in: House/apartment Stairs: No Has following equipment at home:  None  OCCUPATION: retired  PLOF: Independent  PATIENT GOALS:dress without difficulty, do hair, have good ROM and less pain  NEXT MD VISIT:   OBJECTIVE:   DIAGNOSTIC FINDINGS:  See above  PATIENT SURVEYS:  FOTO 21  COGNITION: Overall cognitive status: Within functional limits for tasks assessed     SENSATION: WFL  POSTURE: Fwd head, rounded shoulders, elevated and guarded shoulder  UPPER EXTREMITY ROM:   Active ROM Right PROM eval Right PROM Supine 01/05/23  Shoulder flexion 35  118  Shoulder extension 5    Shoulder abduction 20  90  Shoulder adduction     Shoulder internal rotation 15    Shoulder external rotation 20  45  Elbow flexion 120    Elbow extension 5    Wrist flexion     Wrist extension     Wrist ulnar deviation     Wrist radial deviation     Wrist pronation     Wrist supination     (Blank rows = not tested)  UPPER EXTREMITY MMT:  No tested due to recent surgery  MMT Right eval Left eval  Shoulder flexion    Shoulder extension    Shoulder abduction    Shoulder adduction    Shoulder internal rotation    Shoulder external rotation    Middle trapezius    Lower trapezius    Elbow flexion    Elbow extension    Wrist flexion    Wrist extension    Wrist ulnar deviation    Wrist radial deviation    Wrist pronation    Wrist supination    Grip strength (lbs)    (Blank rows = not tested)  PALPATION:  Very tight and tender in the pectoral, upper trap, the entire right upper arm   TODAY'S TREATMENT:                                                                                                                                         DATE:  01/24/23 UBE x 6 minutes, cues to decrease compensation Red tband Row and extension Red tband ER and horizontal abduction Doorway stretch, this really hurt AAROM STM to the right upper trap, rhomboid,  teres and right upper arm, very tight and sore Vaso medium pressure  01/18/23 UBE with cues to  decrease shoulder elevation x 5 minutes Red tband row and extension Shrugs PROM in supine and in sitting all motions to her tolerance STM to the right upper arm and the right upper trap and pectoral area Vaso medium pressure right sholder 35 degrees   01/17/23 Walk around the back building cues for arm swing Nustep level 5 x 5 minutes Red tband row and extension Red tband ER/IR with assist Wall slides and circles with assist Ball vs wall Supine punches, isometric circles PROM, gentle distraction, gentle C/R Vaso medium pressure  01/12/23 Nustep level 3 x 6 minutes Walk with having her focus on natural arm swing and not holing in a sling position Wall slides and circles with assist 3# bicep curls 1# stick supine chest press PROM supine gentle distraction, flexion , abduction and ER, some passive wrist and elbow stretches STM to the right biceps and pectoral area Vaso medium pressure right shoulder  01/10/23 Nustep L 3 Finger ladder flex and abd 5 x each Cane AA flex ,abd,ER and chest press 10 x each UBE L 1 2 min each way Gentle joint distraction and oscillation to help relax supine PROM to within protocol and pain tolerance- with ROM STM to the right upper trap, neck and right upper arm, some scar mobilization Isometrics supine  VASO 15 min medium pressure after   01/05/23 UBE L 1 2.5 min fwd/2.5 min backward- compensation,backward easier Ball rolling on mat  Gentle joint distraction and oscillation to help relax supine PROM to within protocol and pain tolerance- with ROM STM to the right upper trap, neck and right upper arm, some scar mobilization Isometrics supine  VASO 15 min medium pressure after    01/03/23 UBE L 1 2 min fwd/2 min backward- PTA assist Finger ladder flex and abd 5 x each Active hitchhiker and upper cuts 10 x each Gentle joint distraction and oscillation to help relax supine PROM to within protocol and pain tolerance STM to the right upper  trap, neck and right upper arm, some scar mobilization Isometrics supine  VASO 15 min medium pressure after   12/29/22 Gentle joint distraction and oscillation to help relax supine PROM to within protocol and pain tolerance STM to the right upper trap, neck and right upper arm, some scar mobilization Isometrics supine  VASO 15 min medium pressure after  12/27/22 Gentle joint distraction and oscillation to help relax supine PROM to within protocol and pain tolerance STM to the right upper trap, neck and right upper arm, some scar mobilization Isometrics supine  Standing ball rolling on mat VASO 15 min medium pressure after  12/22/22 Gentle joint distraction and oscillation to help relax supine PROM to within protocol and pain tolerance STM to the right upper trap, neck and right upper arm, some scar mobilization Supine chest press AAROM Supine isometric circles Gentle isometrics circles Vaso medium pressure 34 degrees F  PATIENT EDUCATION: Education details: poc/hep Person educated: Patient Education method: Programmer, multimedia, Facilities manager, Actor cues, Verbal cues, and Handouts Education comprehension: verbalized understanding  HOME EXERCISE PROGRAM: Access Code: [email or web address] URL: [email or web address] Date: 12/20/2022 Prepared by: Stacie Glaze  Exercises - Seated Shoulder Flexion Towel Slide at Table Top  - 2 x daily - 7 x weekly - 2 sets - 10 reps - 3 hold - Seated Shoulder External Rotation PROM on Table  - 2 x daily - 7 x weekly -  2 sets - 10 reps - 3 hold - Seated Elbow Extension and Shoulder External Rotation AAROM at Table with Towel  - 2 x daily - 7 x weekly - 2 sets - 10 reps - 3 hold - Circular Shoulder Pendulum with Table Support  - 2 x daily - 7 x weekly - 2 sets - 10 reps - 3 hold  ASSESSMENT:  CLINICAL IMPRESSION:  Patient very gaurded and sore, the upper arm really hurt with the doorway stretch.  She really had difficulty with ROM today, after  doing some STM this did improve  OBJECTIVE IMPAIRMENTS: cardiopulmonary status limiting activity, decreased activity tolerance, decreased endurance, decreased ROM, decreased strength, increased edema, increased muscle spasms, impaired flexibility, impaired UE functional use, improper body mechanics, postural dysfunction, and pain .  REHAB POTENTIAL: Good  CLINICAL DECISION MAKING: Evolving/moderate complexity  EVALUATION COMPLEXITY: Low   GOALS: Goals reviewed with patient? Yes  SHORT TERM GOALS: Target date: 01/01/23  Independent with initial HEP Goal status: 12/27/22 MET  LONG TERM GOALS: Target date: 03/22/23  Decrease pain 50% Goal status: 01/05/23 progressing  2.  Dress without difficulty Goal status: progressing 01/12/23  3.  Do hair without difficulty Goal status: INITIAL  4.  Increase AROM right shoulder flexion to 130 degrees Goal status: 01/05/23 progressing  5.  Increase right shoulder ER to 60 degrees Goal status: INITIAL  6.  Return to water aerobics and or gym activity Goal status: INITIAL  PLAN:  PT FREQUENCY: 1-2x/week  PT DURATION: 12 weeks  PLANNED INTERVENTIONS: Therapeutic exercises, Therapeutic activity, Neuromuscular re-education, Balance training, Gait training, Patient/Family education, Self Care, Joint mobilization, Dry Needling, Electrical stimulation, Cryotherapy, Vasopneumatic device, and Manual therapy  PLAN FOR NEXT SESSION: may address the mm tightness next visit   Jearld Lesch, PT 01/24/2023, 3:30 PM [facility] Outpatient Rehabilitation at [facility]. [place], [place], [address] Phone: [phone]   Fax:  [phone]

## 2023-01-26 ENCOUNTER — Encounter: Payer: Self-pay | Admitting: Physical Therapy

## 2023-01-26 ENCOUNTER — Ambulatory Visit: Payer: Medicare Other | Admitting: Physical Therapy

## 2023-01-26 DIAGNOSIS — R600 Localized edema: Secondary | ICD-10-CM

## 2023-01-26 DIAGNOSIS — M25511 Pain in right shoulder: Secondary | ICD-10-CM | POA: Diagnosis not present

## 2023-01-26 DIAGNOSIS — M25611 Stiffness of right shoulder, not elsewhere classified: Secondary | ICD-10-CM | POA: Diagnosis not present

## 2023-01-26 DIAGNOSIS — M542 Cervicalgia: Secondary | ICD-10-CM | POA: Diagnosis not present

## 2023-01-26 NOTE — Therapy (Signed)
OUTPATIENT PHYSICAL THERAPY SHOULDER    Patient Name: Veronica Galvan MRN: [id number] DOB:[birth year], 67 y.o., female Today's Date: 01/26/2023  END OF SESSION:  PT End of Session - 01/26/23 0755     Visit Number 12    Date for PT Re-Evaluation 03/22/23    Authorization Type [employer] [place]    PT Start Time [phone]    PT Stop Time 0857    PT Time Calculation (min) 62 min    Activity Tolerance Patient tolerated treatment well    Behavior During Therapy WFL for tasks assessed/performed             Past Medical History:  Diagnosis Date   Allergy    Anxiety    Arthritis    hands   Cataract    Diabetes mellitus without complication (HCC)    type 2   Family history of adverse reaction to anesthesia    son has malignant hyperthermia, daughter does not daughter recently had c section without problems   GERD (gastroesophageal reflux disease)    Headache    sinus   Hyperlipidemia    Hypertension    PONV (postoperative nausea and vomiting)    nausea only   Ulcer, stomach peptic yrs ago   Past Surgical History:  Procedure Laterality Date   APPENDECTOMY     both hells bone spur repair     both heels with metal clips   both shoulder rotator cuff repair     CESAREAN SECTION     x 1   COLONOSCOPY WITH PROPOFOL N/A 09/07/2016   Procedure: COLONOSCOPY WITH PROPOFOL;  Surgeon: Charolett Bumpers, MD;  Location: [facility];  Service: Endoscopy;  Laterality: N/A;   colonscopy  06/2011   polyps   EYE SURGERY     FRACTURE SURGERY     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/10/2022   Procedure: REVERSE SHOULDER ARTHROPLASTY;  Surgeon: Francena Hanly, MD;  Location: [facility];  Service: Orthopedics;  Laterality: Right;  Please follow in room 6 if able   TUBAL LIGATION     VESICO-VAGINAL FISTULA REPAIR     Patient Active Problem List   Diagnosis Date Noted   Gastroesophageal reflux disease 11/16/2021   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 08/18/2021    Dermatofibroma 08/18/2021   History of malignant neoplasm of skin 08/18/2021   Lentigo 08/18/2021   Melanocytic nevi of trunk 08/18/2021   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   Rosacea 08/18/2021   Actinic keratosis 08/18/2021   Sensorineural hearing loss (SNHL) of left ear with unrestricted hearing of right ear 07/26/2021   Tinnitus of left ear 07/26/2021   Acute recurrent maxillary sinusitis 06/22/2021   Primary hypertension 01/12/2021   Hyperlipidemia associated with type 2 diabetes mellitus (HCC) 01/12/2021   Arthritis 01/12/2021   Type II diabetes mellitus (HCC) 11/25/2019   Ingrown toenail 09/05/2018   Neck pain 01/29/2018   Tick bite 10/24/2017    PCP: Dayton Scrape, [name]  REFERRING PROVIDER: Rennis Chris, MD  REFERRING DIAG: s/p right reverse TSA  THERAPY DIAG:  Acute pain of right shoulder  Stiffness of right shoulder, not elsewhere classified  Localized edema  Cervicalgia  Rationale for Evaluation and Treatment: Rehabilitation  ONSET DATE: 11/05/22  SUBJECTIVE:  SUBJECTIVE STATEMENT:   Still very tight and sore in the upper trap, reports can reach face easier PERTINENT HISTORY: See above  PAIN:  Are you having pain? Yes: NPRS scale: 5/10 Pain location: right shoulder Pain description: dull ache at rest, sharp with motions Aggravating factors: quick motions, movements pain up to 10/10 Relieving factors: ice, rest, Tylenol at best 3/10  PRECAUTIONS: Shoulder Protocol in the chart  RED FLAGS: None   WEIGHT BEARING RESTRICTIONS: No  FALLS:  Has patient fallen in last 6 months? Yes. Number of falls 1  LIVING ENVIRONMENT: Lives with: lives alone Lives in: House/apartment Stairs: No Has following equipment at home: None  OCCUPATION: retired  PLOF:  Independent  PATIENT GOALS:dress without difficulty, do hair, have good ROM and less pain  NEXT MD VISIT:   OBJECTIVE:   DIAGNOSTIC FINDINGS:  See above  PATIENT SURVEYS:  FOTO 21  COGNITION: Overall cognitive status: Within functional limits for tasks assessed     SENSATION: WFL  POSTURE: Fwd head, rounded shoulders, elevated and guarded shoulder  UPPER EXTREMITY ROM:   Active ROM Right PROM eval Right PROM Supine 01/05/23 PROM  01/26/23  Shoulder flexion 35  118   Shoulder extension 5     Shoulder abduction 20  90 92  Shoulder adduction      Shoulder internal rotation 15     Shoulder external rotation 20  45 30  Elbow flexion 120     Elbow extension 5     Wrist flexion      Wrist extension      Wrist ulnar deviation      Wrist radial deviation      Wrist pronation      Wrist supination      (Blank rows = not tested)  UPPER EXTREMITY MMT:  No tested due to recent surgery  MMT Right eval Left eval  Shoulder flexion    Shoulder extension    Shoulder abduction    Shoulder adduction    Shoulder internal rotation    Shoulder external rotation    Middle trapezius    Lower trapezius    Elbow flexion    Elbow extension    Wrist flexion    Wrist extension    Wrist ulnar deviation    Wrist radial deviation    Wrist pronation    Wrist supination    Grip strength (lbs)    (Blank rows = not tested)  PALPATION:  Very tight and tender in the pectoral, upper trap, the entire right upper arm   TODAY'S TREATMENT:                                                                                                                                         DATE:  01/26/23 UBE level 1 x 6 minutes Red tband row and extension Red tband ER/IR Ball rolling up PROM in supine flexion,  abduction and ER, gentle joint distraction Supine AAROM punches to ceiling, then flexion AAROM then trying to put hands on top of head and realx STM to the right upper trap, the right upper arm  and the neck area Vaso medium pressure 34 degrees  01/24/23 UBE x 6 minutes, cues to decrease compensation Red tband Row and extension Red tband ER and horizontal abduction Doorway stretch, this really hurt AAROM STM to the right upper trap, rhomboid, teres and right upper arm, very tight and sore Vaso medium pressure  01/18/23 UBE with cues to decrease shoulder elevation x 5 minutes Red tband row and extension Shrugs PROM in supine and in sitting all motions to her tolerance STM to the right upper arm and the right upper trap and pectoral area Vaso medium pressure right sholder 35 degrees   01/17/23 Walk around the back building cues for arm swing Nustep level 5 x 5 minutes Red tband row and extension Red tband ER/IR with assist Wall slides and circles with assist Ball vs wall Supine punches, isometric circles PROM, gentle distraction, gentle C/R Vaso medium pressure  01/12/23 Nustep level 3 x 6 minutes Walk with having her focus on natural arm swing and not holing in a sling position Wall slides and circles with assist 3# bicep curls 1# stick supine chest press PROM supine gentle distraction, flexion , abduction and ER, some passive wrist and elbow stretches STM to the right biceps and pectoral area Vaso medium pressure right shoulder  01/10/23 Nustep L 3 Finger ladder flex and abd 5 x each Cane AA flex ,abd,ER and chest press 10 x each UBE L 1 2 min each way Gentle joint distraction and oscillation to help relax supine PROM to within protocol and pain tolerance- with ROM STM to the right upper trap, neck and right upper arm, some scar mobilization Isometrics supine  VASO 15 min medium pressure after   01/05/23 UBE L 1 2.5 min fwd/2.5 min backward- compensation,backward easier Ball rolling on mat  Gentle joint distraction and oscillation to help relax supine PROM to within protocol and pain tolerance- with ROM STM to the right upper trap, neck and right  upper arm, some scar mobilization Isometrics supine  VASO 15 min medium pressure after    01/03/23 UBE L 1 2 min fwd/2 min backward- PTA assist Finger ladder flex and abd 5 x each Active hitchhiker and upper cuts 10 x each Gentle joint distraction and oscillation to help relax supine PROM to within protocol and pain tolerance STM to the right upper trap, neck and right upper arm, some scar mobilization Isometrics supine  VASO 15 min medium pressure after   12/29/22 Gentle joint distraction and oscillation to help relax supine PROM to within protocol and pain tolerance STM to the right upper trap, neck and right upper arm, some scar mobilization Isometrics supine  VASO 15 min medium pressure after  12/27/22 Gentle joint distraction and oscillation to help relax supine PROM to within protocol and pain tolerance STM to the right upper trap, neck and right upper arm, some scar mobilization Isometrics supine  Standing ball rolling on mat VASO 15 min medium pressure after  12/22/22 Gentle joint distraction and oscillation to help relax supine PROM to within protocol and pain tolerance STM to the right upper trap, neck and right upper arm, some scar mobilization Supine chest press AAROM Supine isometric circles Gentle isometrics circles Vaso medium pressure 34 degrees F  PATIENT EDUCATION: Education details: poc/hep Person  educated: Patient Education method: Explanation, Demonstration, Tactile cues, Verbal cues, and Handouts Education comprehension: verbalized understanding  HOME EXERCISE PROGRAM: Access Code: [email or web address] URL: [email or web address] Date: 12/20/2022 Prepared by: Stacie Glaze  Exercises - Seated Shoulder Flexion Towel Slide at Table Top  - 2 x daily - 7 x weekly - 2 sets - 10 reps - 3 hold - Seated Shoulder External Rotation PROM on Table  - 2 x daily - 7 x weekly - 2 sets - 10 reps - 3 hold - Seated Elbow Extension and Shoulder External  Rotation AAROM at Table with Towel  - 2 x daily - 7 x weekly - 2 sets - 10 reps - 3 hold - Circular Shoulder Pendulum with Table Support  - 2 x daily - 7 x weekly - 2 sets - 10 reps - 3 hold  ASSESSMENT:  CLINICAL IMPRESSION:  Patient doing better today, less guarded with walking and able to let the arm swing some.  She allows the PROM to some extent but has a great deal of difficulty relaxing, seemed to do better with the supine AAROM, still very tight and tender in the trap and upper arm, some going into the neck but she has had neck issues in the past and the guarding is exacerbating this to some extent  OBJECTIVE IMPAIRMENTS: cardiopulmonary status limiting activity, decreased activity tolerance, decreased endurance, decreased ROM, decreased strength, increased edema, increased muscle spasms, impaired flexibility, impaired UE functional use, improper body mechanics, postural dysfunction, and pain .  REHAB POTENTIAL: Good  CLINICAL DECISION MAKING: Evolving/moderate complexity  EVALUATION COMPLEXITY: Low   GOALS: Goals reviewed with patient? Yes  SHORT TERM GOALS: Target date: 01/01/23  Independent with initial HEP Goal status: 12/27/22 MET  LONG TERM GOALS: Target date: 03/22/23  Decrease pain 50% Goal status: 01/05/23 progressing  2.  Dress without difficulty Goal status: progressing 01/12/23  3.  Do hair without difficulty Goal status: progressing still difficulty with bra 01/26/23  4.  Increase AROM right shoulder flexion to 130 degrees Goal status: 01/05/23 progressing  5.  Increase right shoulder ER to 60 degrees Goal status: INITIAL  6.  Return to water aerobics and or gym activity Goal status: INITIAL  PLAN:  PT FREQUENCY: 1-2x/week  PT DURATION: 12 weeks  PLANNED INTERVENTIONS: Therapeutic exercises, Therapeutic activity, Neuromuscular re-education, Balance training, Gait training, Patient/Family education, Self Care, Joint mobilization, Dry Needling, Electrical  stimulation, Cryotherapy, Vasopneumatic device, and Manual therapy  PLAN FOR NEXT SESSION:  Continue with the AAROM and slowly add functional strength   Jearld Lesch, PT 01/26/2023, 9:42 AM [facility] at [facility] [place], [place], [address] Phone: [phone]   Fax:  [phone]

## 2023-01-30 ENCOUNTER — Other Ambulatory Visit: Payer: Self-pay | Admitting: Family

## 2023-01-31 ENCOUNTER — Encounter: Payer: Self-pay | Admitting: Physical Therapy

## 2023-01-31 ENCOUNTER — Ambulatory Visit: Payer: Medicare Other | Attending: Orthopedic Surgery | Admitting: Physical Therapy

## 2023-01-31 ENCOUNTER — Encounter: Payer: Self-pay | Admitting: Family

## 2023-01-31 DIAGNOSIS — M6281 Muscle weakness (generalized): Secondary | ICD-10-CM | POA: Diagnosis not present

## 2023-01-31 DIAGNOSIS — M25611 Stiffness of right shoulder, not elsewhere classified: Secondary | ICD-10-CM | POA: Insufficient documentation

## 2023-01-31 DIAGNOSIS — M542 Cervicalgia: Secondary | ICD-10-CM | POA: Diagnosis not present

## 2023-01-31 DIAGNOSIS — M25511 Pain in right shoulder: Secondary | ICD-10-CM | POA: Insufficient documentation

## 2023-01-31 DIAGNOSIS — R600 Localized edema: Secondary | ICD-10-CM | POA: Diagnosis not present

## 2023-01-31 NOTE — Therapy (Signed)
OUTPATIENT PHYSICAL THERAPY SHOULDER    Patient Name: Veronica Galvan MRN: [id number] DOB:Aug 16, 1955, 67 y.o., female Today's Date: 01/31/2023  END OF SESSION:  PT End of Session - 01/31/23 0755     Visit Number 13    Date for PT Re-Evaluation 03/22/23    Authorization Type [employer]    PT Start Time [phone]    PT Stop Time 0855    PT Time Calculation (min) 60 min    Activity Tolerance Patient tolerated treatment well    Behavior During Therapy WFL for tasks assessed/performed             Past Medical History:  Diagnosis Date   Allergy    Anxiety    Arthritis    hands   Cataract    Diabetes mellitus without complication (HCC)    type 2   Family history of adverse reaction to anesthesia    son has malignant hyperthermia, daughter does not daughter recently had c section without problems   GERD (gastroesophageal reflux disease)    Headache    sinus   Hyperlipidemia    Hypertension    PONV (postoperative nausea and vomiting)    nausea only   Ulcer, stomach peptic yrs ago   Past Surgical History:  Procedure Laterality Date   APPENDECTOMY     both hells bone spur repair     both heels with metal clips   both shoulder rotator cuff repair     CESAREAN SECTION     x 1   COLONOSCOPY WITH PROPOFOL N/A 09/07/2016   Procedure: COLONOSCOPY WITH PROPOFOL;  Surgeon: Charolett Bumpers, MD;  Location: [facility];  Service: Endoscopy;  Laterality: N/A;   colonscopy  06/2011   polyps   EYE SURGERY     FRACTURE SURGERY     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/10/2022   Procedure: REVERSE SHOULDER ARTHROPLASTY;  Surgeon: Francena Hanly, MD;  Location: [facility];  Service: Orthopedics;  Laterality: Right;  Please follow in room 6 if able   TUBAL LIGATION     VESICO-VAGINAL FISTULA REPAIR     Patient Active Problem List   Diagnosis Date Noted   Gastroesophageal reflux disease 11/16/2021   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 08/18/2021    Dermatofibroma 08/18/2021   History of malignant neoplasm of skin 08/18/2021   Lentigo 08/18/2021   Melanocytic nevi of trunk 08/18/2021   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   Rosacea 08/18/2021   Actinic keratosis 08/18/2021   Sensorineural hearing loss (SNHL) of left ear with unrestricted hearing of right ear 07/26/2021   Tinnitus of left ear 07/26/2021   Acute recurrent maxillary sinusitis 06/22/2021   Primary hypertension 01/12/2021   Hyperlipidemia associated with type 2 diabetes mellitus (HCC) 01/12/2021   Arthritis 01/12/2021   Type II diabetes mellitus (HCC) 11/25/2019   Ingrown toenail 09/05/2018   Neck pain 01/29/2018   Tick bite 10/24/2017    PCP: Dayton Scrape, [name]  REFERRING PROVIDER: Rennis Chris, MD  REFERRING DIAG: s/p right reverse TSA  THERAPY DIAG:  Acute pain of right shoulder  Stiffness of right shoulder, not elsewhere classified  Localized edema  Cervicalgia  Rationale for Evaluation and Treatment: Rehabilitation  ONSET DATE: 11/05/22  SUBJECTIVE:  SUBJECTIVE STATEMENT:   Had to babysit this weekend, some soreness.   PERTINENT HISTORY: See above  PAIN:  Are you having pain? Yes: NPRS scale: 5/10 Pain location: right shoulder Pain description: dull ache at rest, sharp with motions Aggravating factors: quick motions, movements pain up to 10/10 Relieving factors: ice, rest, Tylenol at best 3/10  PRECAUTIONS: Shoulder Protocol in the chart  RED FLAGS: None   WEIGHT BEARING RESTRICTIONS: No  FALLS:  Has patient fallen in last 6 months? Yes. Number of falls 1  LIVING ENVIRONMENT: Lives with: lives alone Lives in: House/apartment Stairs: No Has following equipment at home: None  OCCUPATION: retired  PLOF: Independent  PATIENT GOALS:dress without  difficulty, do hair, have good ROM and less pain  NEXT MD VISIT:   OBJECTIVE:   DIAGNOSTIC FINDINGS:  See above  PATIENT SURVEYS:  FOTO 21  COGNITION: Overall cognitive status: Within functional limits for tasks assessed     SENSATION: WFL  POSTURE: Fwd head, rounded shoulders, elevated and guarded shoulder  UPPER EXTREMITY ROM:   Active ROM Right PROM eval Right PROM Supine 01/05/23 PROM  01/26/23  Shoulder flexion 35  118   Shoulder extension 5     Shoulder abduction 20  90 92  Shoulder adduction      Shoulder internal rotation 15     Shoulder external rotation 20  45 30  Elbow flexion 120     Elbow extension 5     Wrist flexion      Wrist extension      Wrist ulnar deviation      Wrist radial deviation      Wrist pronation      Wrist supination      (Blank rows = not tested)  UPPER EXTREMITY MMT:  No tested due to recent surgery  MMT Right eval Left eval  Shoulder flexion    Shoulder extension    Shoulder abduction    Shoulder adduction    Shoulder internal rotation    Shoulder external rotation    Middle trapezius    Lower trapezius    Elbow flexion    Elbow extension    Wrist flexion    Wrist extension    Wrist ulnar deviation    Wrist radial deviation    Wrist pronation    Wrist supination    Grip strength (lbs)    (Blank rows = not tested)  PALPATION:  Very tight and tender in the pectoral, upper trap, the entire right upper arm   TODAY'S TREATMENT:                                                                                                                                         DATE:  01/31/23 Fast walking with cues for arm swing 5# straight arm pulls to pocket Yellow tband ER/IR at neutral Wall slides and circles Supine 1# and 2#  isometric circles Supine 1# flexion small ROM Supine serratus pushes 2# Supine 2# ER from belly to neutral PROM of the right shoulder, joint distraction Vaso medium pressure  01/26/23 UBE level 1 x 6  minutes Red tband row and extension Red tband ER/IR Ball rolling up PROM in supine flexion, abduction and ER, gentle joint distraction Supine AAROM punches to ceiling, then flexion AAROM then trying to put hands on top of head and realx STM to the right upper trap, the right upper arm and the neck area Vaso medium pressure 34 degrees  01/24/23 UBE x 6 minutes, cues to decrease compensation Red tband Row and extension Red tband ER and horizontal abduction Doorway stretch, this really hurt AAROM STM to the right upper trap, rhomboid, teres and right upper arm, very tight and sore Vaso medium pressure  01/18/23 UBE with cues to decrease shoulder elevation x 5 minutes Red tband row and extension Shrugs PROM in supine and in sitting all motions to her tolerance STM to the right upper arm and the right upper trap and pectoral area Vaso medium pressure right sholder 35 degrees   01/17/23 Walk around the back building cues for arm swing Nustep level 5 x 5 minutes Red tband row and extension Red tband ER/IR with assist Wall slides and circles with assist Ball vs wall Supine punches, isometric circles PROM, gentle distraction, gentle C/R Vaso medium pressure  01/12/23 Nustep level 3 x 6 minutes Walk with having her focus on natural arm swing and not holing in a sling position Wall slides and circles with assist 3# bicep curls 1# stick supine chest press PROM supine gentle distraction, flexion , abduction and ER, some passive wrist and elbow stretches STM to the right biceps and pectoral area Vaso medium pressure right shoulder  01/10/23 Nustep L 3 Finger ladder flex and abd 5 x each Cane AA flex ,abd,ER and chest press 10 x each UBE L 1 2 min each way Gentle joint distraction and oscillation to help relax supine PROM to within protocol and pain tolerance- with ROM STM to the right upper trap, neck and right upper arm, some scar mobilization Isometrics supine  VASO 15  min medium pressure after   01/05/23 UBE L 1 2.5 min fwd/2.5 min backward- compensation,backward easier Ball rolling on mat  Gentle joint distraction and oscillation to help relax supine PROM to within protocol and pain tolerance- with ROM STM to the right upper trap, neck and right upper arm, some scar mobilization Isometrics supine  VASO 15 min medium pressure after    01/03/23 UBE L 1 2 min fwd/2 min backward- PTA assist Finger ladder flex and abd 5 x each Active hitchhiker and upper cuts 10 x each Gentle joint distraction and oscillation to help relax supine PROM to within protocol and pain tolerance STM to the right upper trap, neck and right upper arm, some scar mobilization Isometrics supine  VASO 15 min medium pressure after   12/29/22 Gentle joint distraction and oscillation to help relax supine PROM to within protocol and pain tolerance STM to the right upper trap, neck and right upper arm, some scar mobilization Isometrics supine  VASO 15 min medium pressure after  12/27/22 Gentle joint distraction and oscillation to help relax supine PROM to within protocol and pain tolerance STM to the right upper trap, neck and right upper arm, some scar mobilization Isometrics supine  Standing ball rolling on mat VASO 15 min medium pressure after  12/22/22 Gentle joint  distraction and oscillation to help relax supine PROM to within protocol and pain tolerance STM to the right upper trap, neck and right upper arm, some scar mobilization Supine chest press AAROM Supine isometric circles Gentle isometrics circles Vaso medium pressure 34 degrees F  PATIENT EDUCATION: Education details: poc/hep Person educated: Patient Education method: Programmer, multimedia, Facilities manager, Actor cues, Verbal cues, and Handouts Education comprehension: verbalized understanding  HOME EXERCISE PROGRAM: Access Code: [email or web address] URL: [email or web address] Date: 12/20/2022 Prepared by:  Stacie Glaze  Exercises - Seated Shoulder Flexion Towel Slide at Table Top  - 2 x daily - 7 x weekly - 2 sets - 10 reps - 3 hold - Seated Shoulder External Rotation PROM on Table  - 2 x daily - 7 x weekly - 2 sets - 10 reps - 3 hold - Seated Elbow Extension and Shoulder External Rotation AAROM at Table with Towel  - 2 x daily - 7 x weekly - 2 sets - 10 reps - 3 hold - Circular Shoulder Pendulum with Table Support  - 2 x daily - 7 x weekly - 2 sets - 10 reps - 3 hold  ASSESSMENT:  CLINICAL IMPRESSION:  Patienthad some incresaed soreness from the weekend of babysitting, she reports no bad but could tell it was more than her normal.  Still a lot of compensation  OBJECTIVE IMPAIRMENTS: cardiopulmonary status limiting activity, decreased activity tolerance, decreased endurance, decreased ROM, decreased strength, increased edema, increased muscle spasms, impaired flexibility, impaired UE functional use, improper body mechanics, postural dysfunction, and pain .  REHAB POTENTIAL: Good  CLINICAL DECISION MAKING: Evolving/moderate complexity  EVALUATION COMPLEXITY: Low   GOALS: Goals reviewed with patient? Yes  SHORT TERM GOALS: Target date: 01/01/23  Independent with initial HEP Goal status: 12/27/22 MET  LONG TERM GOALS: Target date: 03/22/23  Decrease pain 50% Goal status: 01/05/23 progressing  2.  Dress without difficulty Goal status: progressing 01/12/23  3.  Do hair without difficulty Goal status: progressing still difficulty with bra 01/26/23  4.  Increase AROM right shoulder flexion to 130 degrees Goal status: 01/05/23 progressing  5.  Increase right shoulder ER to 60 degrees Goal status: INITIAL  6.  Return to water aerobics and or gym activity Goal status: INITIAL  PLAN:  PT FREQUENCY: 1-2x/week  PT DURATION: 12 weeks  PLANNED INTERVENTIONS: Therapeutic exercises, Therapeutic activity, Neuromuscular re-education, Balance training, Gait training, Patient/Family  education, Self Care, Joint mobilization, Dry Needling, Electrical stimulation, Cryotherapy, Vasopneumatic device, and Manual therapy  PLAN FOR NEXT SESSION:  Continue with the AAROM and slowly add functional strength   Jearld Lesch, PT 01/31/2023, 7:56 AM [facility] at [facility]. [place], [place], [address] Phone: [phone]   Fax:  [phone]

## 2023-02-02 ENCOUNTER — Encounter: Payer: Self-pay | Admitting: Physical Therapy

## 2023-02-02 ENCOUNTER — Ambulatory Visit: Payer: Medicare Other | Admitting: Physical Therapy

## 2023-02-02 ENCOUNTER — Encounter: Payer: Self-pay | Admitting: Family

## 2023-02-02 DIAGNOSIS — M25611 Stiffness of right shoulder, not elsewhere classified: Secondary | ICD-10-CM

## 2023-02-02 DIAGNOSIS — M6281 Muscle weakness (generalized): Secondary | ICD-10-CM | POA: Diagnosis not present

## 2023-02-02 DIAGNOSIS — M542 Cervicalgia: Secondary | ICD-10-CM | POA: Diagnosis not present

## 2023-02-02 DIAGNOSIS — M25511 Pain in right shoulder: Secondary | ICD-10-CM

## 2023-02-02 DIAGNOSIS — R600 Localized edema: Secondary | ICD-10-CM | POA: Diagnosis not present

## 2023-02-02 NOTE — Therapy (Signed)
OUTPATIENT PHYSICAL THERAPY SHOULDER    Patient Name: Veronica Galvan MRN: [id number] DOB:1956-02-07, 67 y.o., female Today's Date: 02/02/2023  END OF SESSION:  PT End of Session - 02/02/23 1530     Visit Number 14    Date for PT Re-Evaluation 03/22/23    Authorization Type [employer] [place]    PT Start Time 1525    PT Stop Time 1620    PT Time Calculation (min) 55 min    Activity Tolerance Patient tolerated treatment well    Behavior During Therapy WFL for tasks assessed/performed             Past Medical History:  Diagnosis Date   Allergy    Anxiety    Arthritis    hands   Cataract    Diabetes mellitus without complication (HCC)    type 2   Family history of adverse reaction to anesthesia    son has malignant hyperthermia, daughter does not daughter recently had c section without problems   GERD (gastroesophageal reflux disease)    Headache    sinus   Hyperlipidemia    Hypertension    PONV (postoperative nausea and vomiting)    nausea only   Ulcer, stomach peptic yrs ago   Past Surgical History:  Procedure Laterality Date   APPENDECTOMY     both hells bone spur repair     both heels with metal clips   both shoulder rotator cuff repair     CESAREAN SECTION     x 1   COLONOSCOPY WITH PROPOFOL N/A 09/07/2016   Procedure: COLONOSCOPY WITH PROPOFOL;  Surgeon: Charolett Bumpers, MD;  Location: [facility];  Service: Endoscopy;  Laterality: N/A;   colonscopy  06/2011   polyps   EYE SURGERY     FRACTURE SURGERY     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/10/2022   Procedure: REVERSE SHOULDER ARTHROPLASTY;  Surgeon: Francena Hanly, MD;  Location: [facility];  Service: Orthopedics;  Laterality: Right;  Please follow in room 6 if able   TUBAL LIGATION     VESICO-VAGINAL FISTULA REPAIR     Patient Active Problem List   Diagnosis Date Noted   Gastroesophageal reflux disease 11/16/2021   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 08/18/2021    Dermatofibroma 08/18/2021   History of malignant neoplasm of skin 08/18/2021   Lentigo 08/18/2021   Melanocytic nevi of trunk 08/18/2021   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   Rosacea 08/18/2021   Actinic keratosis 08/18/2021   Sensorineural hearing loss (SNHL) of left ear with unrestricted hearing of right ear 07/26/2021   Tinnitus of left ear 07/26/2021   Acute recurrent maxillary sinusitis 06/22/2021   Primary hypertension 01/12/2021   Hyperlipidemia associated with type 2 diabetes mellitus (HCC) 01/12/2021   Arthritis 01/12/2021   Type II diabetes mellitus (HCC) 11/25/2019   Ingrown toenail 09/05/2018   Neck pain 01/29/2018   Tick bite 10/24/2017    PCP: Dayton Scrape, [name]  REFERRING PROVIDER: Rennis Chris, MD  REFERRING DIAG: s/p right reverse TSA  THERAPY DIAG:  Acute pain of right shoulder  Stiffness of right shoulder, not elsewhere classified  Localized edema  Cervicalgia  Muscle weakness (generalized)  Rationale for Evaluation and Treatment: Rehabilitation  ONSET DATE: 11/05/22  SUBJECTIVE:  SUBJECTIVE STATEMENT:   babysat again yesterday and had to pick up child, reports very sore and poor sleep last night   PERTINENT HISTORY: See above  PAIN:  Are you having pain? Yes: NPRS scale: 5/10 Pain location: right shoulder Pain description: dull ache at rest, sharp with motions Aggravating factors: quick motions, movements pain up to 10/10 Relieving factors: ice, rest, Tylenol at best 3/10  PRECAUTIONS: Shoulder Protocol in the chart  RED FLAGS: None   WEIGHT BEARING RESTRICTIONS: No  FALLS:  Has patient fallen in last 6 months? Yes. Number of falls 1  LIVING ENVIRONMENT: Lives with: lives alone Lives in: House/apartment Stairs: No Has following equipment at home:  None  OCCUPATION: retired  PLOF: Independent  PATIENT GOALS:dress without difficulty, do hair, have good ROM and less pain  NEXT MD VISIT:   OBJECTIVE:   DIAGNOSTIC FINDINGS:  See above  PATIENT SURVEYS:  FOTO 21  COGNITION: Overall cognitive status: Within functional limits for tasks assessed     SENSATION: WFL  POSTURE: Fwd head, rounded shoulders, elevated and guarded shoulder  UPPER EXTREMITY ROM:   Active ROM Right PROM eval Right PROM Supine 01/05/23 PROM  01/26/23  Shoulder flexion 35  118   Shoulder extension 5     Shoulder abduction 20  90 92  Shoulder adduction      Shoulder internal rotation 15     Shoulder external rotation 20  45 30  Elbow flexion 120     Elbow extension 5     Wrist flexion      Wrist extension      Wrist ulnar deviation      Wrist radial deviation      Wrist pronation      Wrist supination      (Blank rows = not tested)  UPPER EXTREMITY MMT:  No tested due to recent surgery  MMT Right eval Left eval  Shoulder flexion    Shoulder extension    Shoulder abduction    Shoulder adduction    Shoulder internal rotation    Shoulder external rotation    Middle trapezius    Lower trapezius    Elbow flexion    Elbow extension    Wrist flexion    Wrist extension    Wrist ulnar deviation    Wrist radial deviation    Wrist pronation    Wrist supination    Grip strength (lbs)    (Blank rows = not tested)  PALPATION:  Very tight and tender in the pectoral, upper trap, the entire right upper arm   TODAY'S TREATMENT:                                                                                                                                         DATE:  02/02/23 UBE level 2 x 6 minutes Wall slides, circles Yellow tband ER/IR 2# wate bar overhead  reach 5# row and extension to neutral Supine punches and flexion PROM, AAROM, gentle stretches STM to the right upper trap Vaso medium pressure  01/31/23 Fast walking with  cues for arm swing 5# straight arm pulls to pocket Yellow tband ER/IR at neutral Wall slides and circles Supine 1# and 2# isometric circles Supine 1# flexion small ROM Supine serratus pushes 2# Supine 2# ER from belly to neutral PROM of the right shoulder, joint distraction Vaso medium pressure  01/26/23 UBE level 1 x 6 minutes Red tband row and extension Red tband ER/IR Ball rolling up PROM in supine flexion, abduction and ER, gentle joint distraction Supine AAROM punches to ceiling, then flexion AAROM then trying to put hands on top of head and realx STM to the right upper trap, the right upper arm and the neck area Vaso medium pressure 34 degrees  01/24/23 UBE x 6 minutes, cues to decrease compensation Red tband Row and extension Red tband ER and horizontal abduction Doorway stretch, this really hurt AAROM STM to the right upper trap, rhomboid, teres and right upper arm, very tight and sore Vaso medium pressure  01/18/23 UBE with cues to decrease shoulder elevation x 5 minutes Red tband row and extension Shrugs PROM in supine and in sitting all motions to her tolerance STM to the right upper arm and the right upper trap and pectoral area Vaso medium pressure right sholder 35 degrees   01/17/23 Walk around the back building cues for arm swing Nustep level 5 x 5 minutes Red tband row and extension Red tband ER/IR with assist Wall slides and circles with assist Ball vs wall Supine punches, isometric circles PROM, gentle distraction, gentle C/R Vaso medium pressure  01/12/23 Nustep level 3 x 6 minutes Walk with having her focus on natural arm swing and not holing in a sling position Wall slides and circles with assist 3# bicep curls 1# stick supine chest press PROM supine gentle distraction, flexion , abduction and ER, some passive wrist and elbow stretches STM to the right biceps and pectoral area Vaso medium pressure right shoulder  01/10/23 Nustep L 3  Finger ladder flex and abd 5 x each Cane AA flex ,abd,ER and chest press 10 x each UBE L 1 2 min each way Gentle joint distraction and oscillation to help relax supine PROM to within protocol and pain tolerance- with ROM STM to the right upper trap, neck and right upper arm, some scar mobilization Isometrics supine  VASO 15 min medium pressure after   01/05/23 UBE L 1 2.5 min fwd/2.5 min backward- compensation,backward easier Ball rolling on mat  Gentle joint distraction and oscillation to help relax supine PROM to within protocol and pain tolerance- with ROM STM to the right upper trap, neck and right upper arm, some scar mobilization Isometrics supine  VASO 15 min medium pressure after    01/03/23 UBE L 1 2 min fwd/2 min backward- PTA assist Finger ladder flex and abd 5 x each Active hitchhiker and upper cuts 10 x each Gentle joint distraction and oscillation to help relax supine PROM to within protocol and pain tolerance STM to the right upper trap, neck and right upper arm, some scar mobilization Isometrics supine  VASO 15 min medium pressure after   12/29/22 Gentle joint distraction and oscillation to help relax supine PROM to within protocol and pain tolerance STM to the right upper trap, neck and right upper arm, some scar mobilization Isometrics supine  VASO 15 min medium  pressure after  12/27/22 Gentle joint distraction and oscillation to help relax supine PROM to within protocol and pain tolerance STM to the right upper trap, neck and right upper arm, some scar mobilization Isometrics supine  Standing ball rolling on mat VASO 15 min medium pressure after  12/22/22 Gentle joint distraction and oscillation to help relax supine PROM to within protocol and pain tolerance STM to the right upper trap, neck and right upper arm, some scar mobilization Supine chest press AAROM Supine isometric circles Gentle isometrics circles Vaso medium pressure 34 degrees  F  PATIENT EDUCATION: Education details: poc/hep Person educated: Patient Education method: Programmer, multimedia, Facilities manager, Actor cues, Verbal cues, and Handouts Education comprehension: verbalized understanding  HOME EXERCISE PROGRAM: Access Code: [email or web address] URL: [email or web address] Date: 12/20/2022 Prepared by: Stacie Glaze  Exercises - Seated Shoulder Flexion Towel Slide at Table Top  - 2 x daily - 7 x weekly - 2 sets - 10 reps - 3 hold - Seated Shoulder External Rotation PROM on Table  - 2 x daily - 7 x weekly - 2 sets - 10 reps - 3 hold - Seated Elbow Extension and Shoulder External Rotation AAROM at Table with Towel  - 2 x daily - 7 x weekly - 2 sets - 10 reps - 3 hold - Circular Shoulder Pendulum with Table Support  - 2 x daily - 7 x weekly - 2 sets - 10 reps - 3 hold  ASSESSMENT:  CLINICAL IMPRESSION:  Patient moving better less overall compensation, but still tends to use the upper trap to initiate the motions.  She is sore in the right upper trap and neck and in the right upper arm.  I started very gentle IR.    OBJECTIVE IMPAIRMENTS: cardiopulmonary status limiting activity, decreased activity tolerance, decreased endurance, decreased ROM, decreased strength, increased edema, increased muscle spasms, impaired flexibility, impaired UE functional use, improper body mechanics, postural dysfunction, and pain .  REHAB POTENTIAL: Good  CLINICAL DECISION MAKING: Evolving/moderate complexity  EVALUATION COMPLEXITY: Low   GOALS: Goals reviewed with patient? Yes  SHORT TERM GOALS: Target date: 01/01/23  Independent with initial HEP Goal status: 12/27/22 MET  LONG TERM GOALS: Target date: 03/22/23  Decrease pain 50% Goal status: 01/05/23 progressing  2.  Dress without difficulty Goal status: progressing 01/12/23  3.  Do hair without difficulty Goal status: progressing still difficulty with bra 01/26/23  4.  Increase AROM right shoulder flexion to 130  degrees Goal status: 01/05/23 progressing  5.  Increase right shoulder ER to 60 degrees Goal status: INITIAL  6.  Return to water aerobics and or gym activity Goal status: INITIAL  PLAN:  PT FREQUENCY: 1-2x/week  PT DURATION: 12 weeks  PLANNED INTERVENTIONS: Therapeutic exercises, Therapeutic activity, Neuromuscular re-education, Balance training, Gait training, Patient/Family education, Self Care, Joint mobilization, Dry Needling, Electrical stimulation, Cryotherapy, Vasopneumatic device, and Manual therapy  PLAN FOR NEXT SESSION:  Continue with the AAROM and slowly add functional strength   Jearld Lesch, PT 02/02/2023, 3:31 PM [facility] [address] [facility] [place], [place], [address] Phone: [phone]   Fax:  [phone]

## 2023-02-04 ENCOUNTER — Other Ambulatory Visit: Payer: Self-pay | Admitting: Family

## 2023-02-07 ENCOUNTER — Ambulatory Visit: Payer: Medicare Other | Admitting: Physical Therapy

## 2023-02-08 ENCOUNTER — Ambulatory Visit: Payer: Medicare Other | Admitting: Physical Therapy

## 2023-02-08 ENCOUNTER — Encounter: Payer: Self-pay | Admitting: Physical Therapy

## 2023-02-08 DIAGNOSIS — M25611 Stiffness of right shoulder, not elsewhere classified: Secondary | ICD-10-CM

## 2023-02-08 DIAGNOSIS — M25511 Pain in right shoulder: Secondary | ICD-10-CM

## 2023-02-08 DIAGNOSIS — R600 Localized edema: Secondary | ICD-10-CM | POA: Diagnosis not present

## 2023-02-08 DIAGNOSIS — M6281 Muscle weakness (generalized): Secondary | ICD-10-CM

## 2023-02-08 DIAGNOSIS — M542 Cervicalgia: Secondary | ICD-10-CM | POA: Diagnosis not present

## 2023-02-08 NOTE — Therapy (Signed)
OUTPATIENT PHYSICAL THERAPY SHOULDER    Patient Name: Veronica Galvan MRN: [id number] DOB:[birth year], 67 y.o., female Today's Date: 02/08/2023  END OF SESSION:  PT End of Session - 02/08/23 1311     Visit Number 15    Date for PT Re-Evaluation 03/22/23    Authorization Type [employer] [place]    PT Start Time 1311    PT Stop Time 1410    PT Time Calculation (min) 59 min    Activity Tolerance Patient tolerated treatment well    Behavior During Therapy WFL for tasks assessed/performed             Past Medical History:  Diagnosis Date   Allergy    Anxiety    Arthritis    hands   Cataract    Diabetes mellitus without complication (HCC)    type 2   Family history of adverse reaction to anesthesia    son has malignant hyperthermia, daughter does not daughter recently had c section without problems   GERD (gastroesophageal reflux disease)    Headache    sinus   Hyperlipidemia    Hypertension    PONV (postoperative nausea and vomiting)    nausea only   Ulcer, stomach peptic yrs ago   Past Surgical History:  Procedure Laterality Date   APPENDECTOMY     both hells bone spur repair     both heels with metal clips   both shoulder rotator cuff repair     CESAREAN SECTION     x 1   COLONOSCOPY WITH PROPOFOL N/A 09/07/2016   Procedure: COLONOSCOPY WITH PROPOFOL;  Surgeon: Charolett Bumpers, MD;  Location: [facility];  Service: Endoscopy;  Laterality: N/A;   colonscopy  06/2011   polyps   EYE SURGERY     FRACTURE SURGERY     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/10/2022   Procedure: REVERSE SHOULDER ARTHROPLASTY;  Surgeon: Francena Hanly, MD;  Location: [facility];  Service: Orthopedics;  Laterality: Right;  Please follow in room 6 if able   TUBAL LIGATION     VESICO-VAGINAL FISTULA REPAIR     Patient Active Problem List   Diagnosis Date Noted   Gastroesophageal reflux disease 11/16/2021   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 08/18/2021    Dermatofibroma 08/18/2021   History of malignant neoplasm of skin 08/18/2021   Lentigo 08/18/2021   Melanocytic nevi of trunk 08/18/2021   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   Rosacea 08/18/2021   Actinic keratosis 08/18/2021   Sensorineural hearing loss (SNHL) of left ear with unrestricted hearing of right ear 07/26/2021   Tinnitus of left ear 07/26/2021   Acute recurrent maxillary sinusitis 06/22/2021   Primary hypertension 01/12/2021   Hyperlipidemia associated with type 2 diabetes mellitus (HCC) 01/12/2021   Arthritis 01/12/2021   Type II diabetes mellitus (HCC) 11/25/2019   Ingrown toenail 09/05/2018   Neck pain 01/29/2018   Tick bite 10/24/2017    PCP: Dayton Scrape, [name]  REFERRING PROVIDER: Rennis Chris, MD  REFERRING DIAG: s/p right reverse TSA  THERAPY DIAG:  Acute pain of right shoulder  Stiffness of right shoulder, not elsewhere classified  Localized edema  Cervicalgia  Muscle weakness (generalized)  Rationale for Evaluation and Treatment: Rehabilitation  ONSET DATE: 11/05/22  SUBJECTIVE:  SUBJECTIVE STATEMENT:   Patient reports that she is having a lot more soreness, she is becoming much more active , c/o the arm feeling tight and swollen PERTINENT HISTORY: See above  PAIN:  Are you having pain? Yes: NPRS scale: 6/10 Pain location: right shoulder Pain description: dull ache at rest, sharp with motions Aggravating factors: quick motions, movements pain up to 10/10 Relieving factors: ice, rest, Tylenol at best 3/10  PRECAUTIONS: Shoulder Protocol in the chart  RED FLAGS: None   WEIGHT BEARING RESTRICTIONS: No  FALLS:  Has patient fallen in last 6 months? Yes. Number of falls 1  LIVING ENVIRONMENT: Lives with: lives alone Lives in: House/apartment Stairs: No Has  following equipment at home: None  OCCUPATION: retired  PLOF: Independent  PATIENT GOALS:dress without difficulty, do hair, have good ROM and less pain  NEXT MD VISIT:   OBJECTIVE:   DIAGNOSTIC FINDINGS:  See above  PATIENT SURVEYS:  FOTO 21  COGNITION: Overall cognitive status: Within functional limits for tasks assessed     SENSATION: WFL  POSTURE: Fwd head, rounded shoulders, elevated and guarded shoulder  UPPER EXTREMITY ROM:   Active ROM Right PROM eval Right PROM Supine 01/05/23 PROM  01/26/23  Shoulder flexion 35  118   Shoulder extension 5     Shoulder abduction 20  90 92  Shoulder adduction      Shoulder internal rotation 15     Shoulder external rotation 20  45 30  Elbow flexion 120     Elbow extension 5     Wrist flexion      Wrist extension      Wrist ulnar deviation      Wrist radial deviation      Wrist pronation      Wrist supination      (Blank rows = not tested)  UPPER EXTREMITY MMT:  No tested due to recent surgery  MMT Right eval Left eval  Shoulder flexion    Shoulder extension    Shoulder abduction    Shoulder adduction    Shoulder internal rotation    Shoulder external rotation    Middle trapezius    Lower trapezius    Elbow flexion    Elbow extension    Wrist flexion    Wrist extension    Wrist ulnar deviation    Wrist radial deviation    Wrist pronation    Wrist supination    Grip strength (lbs)    (Blank rows = not tested)  PALPATION:  Very tight and tender in the pectoral, upper trap, the entire right upper arm   TODAY'S TREATMENT:                                                                                                                                         DATE:  02/08/23 UBE level 4 x 4 minutes 2# wate bar overhead reach  with assist 2# wate bar easy extension, very gentle 15# lats 2x7 10# seated row 2x10 5# straight arm pulls Wall slides and wall circles Supine 1# ER/IR  2# and 3# punches, isometric  circles and flexion Passive stretch to the right shoulder, very guarded  02/02/23 UBE level 2 x 6 minutes Wall slides, circles Yellow tband ER/IR 2# wate bar overhead reach 5# row and extension to neutral Supine punches and flexion PROM, AAROM, gentle stretches STM to the right upper trap Vaso medium pressure  01/31/23 Fast walking with cues for arm swing 5# straight arm pulls to pocket Yellow tband ER/IR at neutral Wall slides and circles Supine 1# and 2# isometric circles Supine 1# flexion small ROM Supine serratus pushes 2# Supine 2# ER from belly to neutral PROM of the right shoulder, joint distraction Vaso medium pressure  01/26/23 UBE level 1 x 6 minutes Red tband row and extension Red tband ER/IR Ball rolling up PROM in supine flexion, abduction and ER, gentle joint distraction Supine AAROM punches to ceiling, then flexion AAROM then trying to put hands on top of head and realx STM to the right upper trap, the right upper arm and the neck area Vaso medium pressure 34 degrees  01/24/23 UBE x 6 minutes, cues to decrease compensation Red tband Row and extension Red tband ER and horizontal abduction Doorway stretch, this really hurt AAROM STM to the right upper trap, rhomboid, teres and right upper arm, very tight and sore Vaso medium pressure  01/18/23 UBE with cues to decrease shoulder elevation x 5 minutes Red tband row and extension Shrugs PROM in supine and in sitting all motions to her tolerance STM to the right upper arm and the right upper trap and pectoral area Vaso medium pressure right sholder 35 degrees   01/17/23 Walk around the back building cues for arm swing Nustep level 5 x 5 minutes Red tband row and extension Red tband ER/IR with assist Wall slides and circles with assist Ball vs wall Supine punches, isometric circles PROM, gentle distraction, gentle C/R Vaso medium pressure  01/12/23 Nustep level 3 x 6 minutes Walk with having her  focus on natural arm swing and not holing in a sling position Wall slides and circles with assist 3# bicep curls 1# stick supine chest press PROM supine gentle distraction, flexion , abduction and ER, some passive wrist and elbow stretches STM to the right biceps and pectoral area Vaso medium pressure right shoulder  01/10/23 Nustep L 3 Finger ladder flex and abd 5 x each Cane AA flex ,abd,ER and chest press 10 x each UBE L 1 2 min each way Gentle joint distraction and oscillation to help relax supine PROM to within protocol and pain tolerance- with ROM STM to the right upper trap, neck and right upper arm, some scar mobilization Isometrics supine  VASO 15 min medium pressure after   01/05/23 UBE L 1 2.5 min fwd/2.5 min backward- compensation,backward easier Ball rolling on mat  Gentle joint distraction and oscillation to help relax supine PROM to within protocol and pain tolerance- with ROM STM to the right upper trap, neck and right upper arm, some scar mobilization Isometrics supine  VASO 15 min medium pressure after    01/03/23 UBE L 1 2 min fwd/2 min backward- PTA assist Finger ladder flex and abd 5 x each Active hitchhiker and upper cuts 10 x each Gentle joint distraction and oscillation to help relax supine PROM to within protocol and pain tolerance  STM to the right upper trap, neck and right upper arm, some scar mobilization Isometrics supine  VASO 15 min medium pressure after   12/29/22 Gentle joint distraction and oscillation to help relax supine PROM to within protocol and pain tolerance STM to the right upper trap, neck and right upper arm, some scar mobilization Isometrics supine  VASO 15 min medium pressure after  12/27/22 Gentle joint distraction and oscillation to help relax supine PROM to within protocol and pain tolerance STM to the right upper trap, neck and right upper arm, some scar mobilization Isometrics supine  Standing ball rolling on  mat VASO 15 min medium pressure after  12/22/22 Gentle joint distraction and oscillation to help relax supine PROM to within protocol and pain tolerance STM to the right upper trap, neck and right upper arm, some scar mobilization Supine chest press AAROM Supine isometric circles Gentle isometrics circles Vaso medium pressure 34 degrees F  PATIENT EDUCATION: Education details: poc/hep Person educated: Patient Education method: Programmer, multimedia, Facilities manager, Actor cues, Verbal cues, and Handouts Education comprehension: verbalized understanding  HOME EXERCISE PROGRAM: Access Code: [email or web address] URL: [email or web address] Date: 12/20/2022 Prepared by: Stacie Glaze  Exercises - Seated Shoulder Flexion Towel Slide at Table Top  - 2 x daily - 7 x weekly - 2 sets - 10 reps - 3 hold - Seated Shoulder External Rotation PROM on Table  - 2 x daily - 7 x weekly - 2 sets - 10 reps - 3 hold - Seated Elbow Extension and Shoulder External Rotation AAROM at Table with Towel  - 2 x daily - 7 x weekly - 2 sets - 10 reps - 3 hold - Circular Shoulder Pendulum with Table Support  - 2 x daily - 7 x weekly - 2 sets - 10 reps - 3 hold  ASSESSMENT:  CLINICAL IMPRESSION:  Patient sore today, again she has been more active.  I struggle to get her to relax for ER passive stretches, she does well with abduction up to 100 degrees.  She was very sore in the right upper arm today and seems to have some tenderness  OBJECTIVE IMPAIRMENTS: cardiopulmonary status limiting activity, decreased activity tolerance, decreased endurance, decreased ROM, decreased strength, increased edema, increased muscle spasms, impaired flexibility, impaired UE functional use, improper body mechanics, postural dysfunction, and pain .  REHAB POTENTIAL: Good  CLINICAL DECISION MAKING: Evolving/moderate complexity  EVALUATION COMPLEXITY: Low   GOALS: Goals reviewed with patient? Yes  SHORT TERM GOALS: Target date:  01/01/23  Independent with initial HEP Goal status: 12/27/22 MET  LONG TERM GOALS: Target date: 03/22/23  Decrease pain 50% Goal status: 02/08/23 progressing  2.  Dress without difficulty Goal status: progressing 02/08/23  3.  Do hair without difficulty Goal status: progressing still difficulty with bra 01/26/23  4.  Increase AROM right shoulder flexion to 130 degrees Goal status: 01/05/23 progressing  5.  Increase right shoulder ER to 60 degrees Goal status: INITIAL  6.  Return to water aerobics and or gym activity Goal status: INITIAL  PLAN:  PT FREQUENCY: 1-2x/week  PT DURATION: 12 weeks  PLANNED INTERVENTIONS: Therapeutic exercises, Therapeutic activity, Neuromuscular re-education, Balance training, Gait training, Patient/Family education, Self Care, Joint mobilization, Dry Needling, Electrical stimulation, Cryotherapy, Vasopneumatic device, and Manual therapy  PLAN FOR NEXT SESSION:  Continue with the AAROM and slowly add functional strength, measure   Jearld Lesch, PT 02/08/2023, 1:12 PM [facility] [address] [facility] [place], [place], [address] Phone:  [phone]   Fax:  [phone]

## 2023-02-09 ENCOUNTER — Ambulatory Visit: Payer: Medicare Other | Admitting: Physical Therapy

## 2023-02-09 ENCOUNTER — Encounter: Payer: Self-pay | Admitting: Physical Therapy

## 2023-02-09 DIAGNOSIS — M542 Cervicalgia: Secondary | ICD-10-CM | POA: Diagnosis not present

## 2023-02-09 DIAGNOSIS — M25511 Pain in right shoulder: Secondary | ICD-10-CM | POA: Diagnosis not present

## 2023-02-09 DIAGNOSIS — M6281 Muscle weakness (generalized): Secondary | ICD-10-CM

## 2023-02-09 DIAGNOSIS — M25611 Stiffness of right shoulder, not elsewhere classified: Secondary | ICD-10-CM | POA: Diagnosis not present

## 2023-02-09 DIAGNOSIS — R600 Localized edema: Secondary | ICD-10-CM | POA: Diagnosis not present

## 2023-02-09 NOTE — Therapy (Signed)
OUTPATIENT PHYSICAL THERAPY SHOULDER    Patient Name: Veronica Galvan MRN: [id number] DOB:1955/09/01, 67 y.o., female Today's Date: 02/09/2023  END OF SESSION:  PT End of Session - 02/09/23 0757     Visit Number 16    Date for PT Re-Evaluation 03/22/23    Authorization Type [employer] [place]    PT Start Time [phone]    PT Stop Time 0855    PT Time Calculation (min) 58 min    Activity Tolerance Patient tolerated treatment well    Behavior During Therapy WFL for tasks assessed/performed             Past Medical History:  Diagnosis Date   Allergy    Anxiety    Arthritis    hands   Cataract    Diabetes mellitus without complication (HCC)    type 2   Family history of adverse reaction to anesthesia    son has malignant hyperthermia, daughter does not daughter recently had c section without problems   GERD (gastroesophageal reflux disease)    Headache    sinus   Hyperlipidemia    Hypertension    PONV (postoperative nausea and vomiting)    nausea only   Ulcer, stomach peptic yrs ago   Past Surgical History:  Procedure Laterality Date   APPENDECTOMY     both hells bone spur repair     both heels with metal clips   both shoulder rotator cuff repair     CESAREAN SECTION     x 1   COLONOSCOPY WITH PROPOFOL N/A 09/07/2016   Procedure: COLONOSCOPY WITH PROPOFOL;  Surgeon: Charolett Bumpers, MD;  Location: [facility];  Service: Endoscopy;  Laterality: N/A;   colonscopy  06/2011   polyps   EYE SURGERY     FRACTURE SURGERY     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/10/2022   Procedure: REVERSE SHOULDER ARTHROPLASTY;  Surgeon: Francena Hanly, MD;  Location: [facility];  Service: Orthopedics;  Laterality: Right;  Please follow in room 6 if able   TUBAL LIGATION     VESICO-VAGINAL FISTULA REPAIR     Patient Active Problem List   Diagnosis Date Noted   Gastroesophageal reflux disease 11/16/2021   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 08/18/2021    Dermatofibroma 08/18/2021   History of malignant neoplasm of skin 08/18/2021   Lentigo 08/18/2021   Melanocytic nevi of trunk 08/18/2021   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   Rosacea 08/18/2021   Actinic keratosis 08/18/2021   Sensorineural hearing loss (SNHL) of left ear with unrestricted hearing of right ear 07/26/2021   Tinnitus of left ear 07/26/2021   Acute recurrent maxillary sinusitis 06/22/2021   Primary hypertension 01/12/2021   Hyperlipidemia associated with type 2 diabetes mellitus (HCC) 01/12/2021   Arthritis 01/12/2021   Type II diabetes mellitus (HCC) 11/25/2019   Ingrown toenail 09/05/2018   Neck pain 01/29/2018   Tick bite 10/24/2017    PCP: Dayton Scrape, [name]  REFERRING PROVIDER: Rennis Chris, MD  REFERRING DIAG: s/p right reverse TSA  THERAPY DIAG:  Acute pain of right shoulder  Stiffness of right shoulder, not elsewhere classified  Localized edema  Cervicalgia  Muscle weakness (generalized)  Rationale for Evaluation and Treatment: Rehabilitation  ONSET DATE: 11/05/22  SUBJECTIVE:  SUBJECTIVE STATEMENT:   Patient continues to be tight and sore PERTINENT HISTORY: See above  PAIN:  Are you having pain? Yes: NPRS scale: 6/10 Pain location: right shoulder Pain description: dull ache at rest, sharp with motions Aggravating factors: quick motions, movements pain up to 10/10 Relieving factors: ice, rest, Tylenol at best 3/10  PRECAUTIONS: Shoulder Protocol in the chart  RED FLAGS: None   WEIGHT BEARING RESTRICTIONS: No  FALLS:  Has patient fallen in last 6 months? Yes. Number of falls 1  LIVING ENVIRONMENT: Lives with: lives alone Lives in: House/apartment Stairs: No Has following equipment at home: None  OCCUPATION: retired  PLOF: Independent  PATIENT  GOALS:dress without difficulty, do hair, have good ROM and less pain  NEXT MD VISIT:   OBJECTIVE:   DIAGNOSTIC FINDINGS:  See above  PATIENT SURVEYS:  FOTO 21  COGNITION: Overall cognitive status: Within functional limits for tasks assessed     SENSATION: WFL  POSTURE: Fwd head, rounded shoulders, elevated and guarded shoulder  UPPER EXTREMITY ROM:   Active ROM Right PROM eval Right PROM Supine 01/05/23 PROM  01/26/23 PROM 02/09/23  Shoulder flexion 35  118  123  Shoulder extension 5      Shoulder abduction 20  90 92   Shoulder adduction       Shoulder internal rotation 15      Shoulder external rotation 20  45 30   Elbow flexion 120      Elbow extension 5      Wrist flexion       Wrist extension       Wrist ulnar deviation       Wrist radial deviation       Wrist pronation       Wrist supination       (Blank rows = not tested)  UPPER EXTREMITY MMT:  No tested due to recent surgery  MMT Right eval Left eval  Shoulder flexion    Shoulder extension    Shoulder abduction    Shoulder adduction    Shoulder internal rotation    Shoulder external rotation    Middle trapezius    Lower trapezius    Elbow flexion    Elbow extension    Wrist flexion    Wrist extension    Wrist ulnar deviation    Wrist radial deviation    Wrist pronation    Wrist supination    Grip strength (lbs)    (Blank rows = not tested)  PALPATION:  Very tight and tender in the pectoral, upper trap, the entire right upper arm   TODAY'S TREATMENT:                                                                                                                                         DATE:  02/09/23 Weight 189# UBE x 4 minutes Seated row 10# Lats 15#  5# straight arm pulls Red tband ER/IR Wall slides with eccentrics Supine 2# bar chest press Supine 2# bar flexion PROM with some light contract relax Vaso medium pressure  02/08/23 UBE level 4 x 4 minutes 2# wate bar overhead reach  with assist 2# wate bar easy extension, very gentle 15# lats 2x7 10# seated row 2x10 5# straight arm pulls Wall slides and wall circles Supine 1# ER/IR  2# and 3# punches, isometric circles and flexion Passive stretch to the right shoulder, very guarded  02/02/23 UBE level 2 x 6 minutes Wall slides, circles Yellow tband ER/IR 2# wate bar overhead reach 5# row and extension to neutral Supine punches and flexion PROM, AAROM, gentle stretches STM to the right upper trap Vaso medium pressure  01/31/23 Fast walking with cues for arm swing 5# straight arm pulls to pocket Yellow tband ER/IR at neutral Wall slides and circles Supine 1# and 2# isometric circles Supine 1# flexion small ROM Supine serratus pushes 2# Supine 2# ER from belly to neutral PROM of the right shoulder, joint distraction Vaso medium pressure  01/26/23 UBE level 1 x 6 minutes Red tband row and extension Red tband ER/IR Ball rolling up PROM in supine flexion, abduction and ER, gentle joint distraction Supine AAROM punches to ceiling, then flexion AAROM then trying to put hands on top of head and realx STM to the right upper trap, the right upper arm and the neck area Vaso medium pressure 34 degrees  01/24/23 UBE x 6 minutes, cues to decrease compensation Red tband Row and extension Red tband ER and horizontal abduction Doorway stretch, this really hurt AAROM STM to the right upper trap, rhomboid, teres and right upper arm, very tight and sore Vaso medium pressure  01/18/23 UBE with cues to decrease shoulder elevation x 5 minutes Red tband row and extension Shrugs PROM in supine and in sitting all motions to her tolerance STM to the right upper arm and the right upper trap and pectoral area Vaso medium pressure right sholder 35 degrees   01/17/23 Walk around the back building cues for arm swing Nustep level 5 x 5 minutes Red tband row and extension Red tband ER/IR with assist Wall slides and  circles with assist Ball vs wall Supine punches, isometric circles PROM, gentle distraction, gentle C/R Vaso medium pressure  01/12/23 Nustep level 3 x 6 minutes Walk with having her focus on natural arm swing and not holing in a sling position Wall slides and circles with assist 3# bicep curls 1# stick supine chest press PROM supine gentle distraction, flexion , abduction and ER, some passive wrist and elbow stretches STM to the right biceps and pectoral area Vaso medium pressure right shoulder  01/10/23 Nustep L 3 Finger ladder flex and abd 5 x each Cane AA flex ,abd,ER and chest press 10 x each UBE L 1 2 min each way Gentle joint distraction and oscillation to help relax supine PROM to within protocol and pain tolerance- with ROM STM to the right upper trap, neck and right upper arm, some scar mobilization Isometrics supine  VASO 15 min medium pressure after   01/05/23 UBE L 1 2.5 min fwd/2.5 min backward- compensation,backward easier Ball rolling on mat  Gentle joint distraction and oscillation to help relax supine PROM to within protocol and pain tolerance- with ROM STM to the right upper trap, neck and right upper arm, some scar mobilization Isometrics supine  VASO 15 min medium pressure after    01/03/23  UBE L 1 2 min fwd/2 min backward- PTA assist Finger ladder flex and abd 5 x each Active hitchhiker and upper cuts 10 x each Gentle joint distraction and oscillation to help relax supine PROM to within protocol and pain tolerance STM to the right upper trap, neck and right upper arm, some scar mobilization Isometrics supine  VASO 15 min medium pressure after   12/29/22 Gentle joint distraction and oscillation to help relax supine PROM to within protocol and pain tolerance STM to the right upper trap, neck and right upper arm, some scar mobilization Isometrics supine  VASO 15 min medium pressure after  12/27/22 Gentle joint distraction and oscillation to  help relax supine PROM to within protocol and pain tolerance STM to the right upper trap, neck and right upper arm, some scar mobilization Isometrics supine  Standing ball rolling on mat VASO 15 min medium pressure after  PATIENT EDUCATION: Education details: poc/hep Person educated: Patient Education method: Programmer, multimedia, Facilities manager, Actor cues, Verbal cues, and Handouts Education comprehension: verbalized understanding  HOME EXERCISE PROGRAM: Access Code: [email or web address] URL: [email or web address] Date: 12/20/2022 Prepared by: Stacie Glaze  Exercises - Seated Shoulder Flexion Towel Slide at Table Top  - 2 x daily - 7 x weekly - 2 sets - 10 reps - 3 hold - Seated Shoulder External Rotation PROM on Table  - 2 x daily - 7 x weekly - 2 sets - 10 reps - 3 hold - Seated Elbow Extension and Shoulder External Rotation AAROM at Table with Towel  - 2 x daily - 7 x weekly - 2 sets - 10 reps - 3 hold - Circular Shoulder Pendulum with Table Support  - 2 x daily - 7 x weekly - 2 sets - 10 reps - 3 hold  ASSESSMENT:  CLINICAL IMPRESSION:  Patient sore today, We are working on functional strength and ROM, started some light contract relax to get better ROM, this seems to help and we did do better with the ROM, still needs a lot of cues to allow the motions as well as decreased the shoulder elevation OBJECTIVE IMPAIRMENTS: cardiopulmonary status limiting activity, decreased activity tolerance, decreased endurance, decreased ROM, decreased strength, increased edema, increased muscle spasms, impaired flexibility, impaired UE functional use, improper body mechanics, postural dysfunction, and pain .  REHAB POTENTIAL: Good  CLINICAL DECISION MAKING: Evolving/moderate complexity  EVALUATION COMPLEXITY: Low   GOALS: Goals reviewed with patient? Yes  SHORT TERM GOALS: Target date: 01/01/23  Independent with initial HEP Goal status: 12/27/22 MET  LONG TERM GOALS: Target date:  03/22/23  Decrease pain 50% Goal status: 02/08/23 progressing  2.  Dress without difficulty Goal status: progressing 02/08/23  3.  Do hair without difficulty Goal status: progressing still difficulty with bra 01/26/23  4.  Increase AROM right shoulder flexion to 130 degrees Goal status: 01/05/23 progressing  5.  Increase right shoulder ER to 60 degrees Goal status: INITIAL  6.  Return to water aerobics and or gym activity Goal status: INITIAL  PLAN:  PT FREQUENCY: 1-2x/week  PT DURATION: 12 weeks  PLANNED INTERVENTIONS: Therapeutic exercises, Therapeutic activity, Neuromuscular re-education, Balance training, Gait training, Patient/Family education, Self Care, Joint mobilization, Dry Needling, Electrical stimulation, Cryotherapy, Vasopneumatic device, and Manual therapy  PLAN FOR NEXT SESSION:  Continue with the AAROM and slowly add functional strength, measure   Jearld Lesch, PT 02/09/2023, 7:58 AM [place] [facility] [address] [facility]. [place], [place], [address] Phone: [phone]   Fax:  [phone] 

## 2023-02-14 ENCOUNTER — Ambulatory Visit: Payer: Medicare Other | Admitting: Physical Therapy

## 2023-02-14 ENCOUNTER — Encounter: Payer: Self-pay | Admitting: Physical Therapy

## 2023-02-14 DIAGNOSIS — M25511 Pain in right shoulder: Secondary | ICD-10-CM | POA: Diagnosis not present

## 2023-02-14 DIAGNOSIS — R600 Localized edema: Secondary | ICD-10-CM

## 2023-02-14 DIAGNOSIS — M542 Cervicalgia: Secondary | ICD-10-CM | POA: Diagnosis not present

## 2023-02-14 DIAGNOSIS — M6281 Muscle weakness (generalized): Secondary | ICD-10-CM | POA: Diagnosis not present

## 2023-02-14 DIAGNOSIS — M25611 Stiffness of right shoulder, not elsewhere classified: Secondary | ICD-10-CM

## 2023-02-14 NOTE — Therapy (Signed)
OUTPATIENT PHYSICAL THERAPY SHOULDER    Patient Name: Veronica Galvan MRN: [id number] DOB:[birth year], 67 y.o., female Today's Date: 02/14/2023  END OF SESSION:  PT End of Session - 02/14/23 0843     Visit Number 17    Date for PT Re-Evaluation 03/22/23    Authorization Type [employer] [place]    PT Start Time [phone]    PT Stop Time 0939    PT Time Calculation (min) 56 min    Activity Tolerance Patient tolerated treatment well    Behavior During Therapy [facility] for tasks assessed/performed             Past Medical History:  Diagnosis Date   Allergy    Anxiety    Arthritis    hands   Cataract    Diabetes mellitus without complication (HCC)    type 2   Family history of adverse reaction to anesthesia    son has malignant hyperthermia, daughter does not daughter recently had c section without problems   GERD (gastroesophageal reflux disease)    Headache    sinus   Hyperlipidemia    Hypertension    PONV (postoperative nausea and vomiting)    nausea only   Ulcer, stomach peptic yrs ago   Past Surgical History:  Procedure Laterality Date   APPENDECTOMY     both hells bone spur repair     both heels with metal clips   both shoulder rotator cuff repair     CESAREAN SECTION     x 1   COLONOSCOPY WITH PROPOFOL N/A 09/07/2016   Procedure: COLONOSCOPY WITH PROPOFOL;  Surgeon: Charolett Bumpers, MD;  Location: [facility];  Service: Endoscopy;  Laterality: N/A;   colonscopy  06/2011   polyps   EYE SURGERY     FRACTURE SURGERY     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/10/2022   Procedure: REVERSE SHOULDER ARTHROPLASTY;  Surgeon: Francena Hanly, MD;  Location: [facility];  Service: Orthopedics;  Laterality: Right;  Please follow in room 6 if able   TUBAL LIGATION     VESICO-VAGINAL FISTULA REPAIR     Patient Active Problem List   Diagnosis Date Noted   Gastroesophageal reflux disease 11/16/2021   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 08/18/2021    Dermatofibroma 08/18/2021   History of malignant neoplasm of skin 08/18/2021   Lentigo 08/18/2021   Melanocytic nevi of trunk 08/18/2021   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   Rosacea 08/18/2021   Actinic keratosis 08/18/2021   Sensorineural hearing loss (SNHL) of left ear with unrestricted hearing of right ear 07/26/2021   Tinnitus of left ear 07/26/2021   Acute recurrent maxillary sinusitis 06/22/2021   Primary hypertension 01/12/2021   Hyperlipidemia associated with type 2 diabetes mellitus (HCC) 01/12/2021   Arthritis 01/12/2021   Type II diabetes mellitus (HCC) 11/25/2019   Ingrown toenail 09/05/2018   Neck pain 01/29/2018   Tick bite 10/24/2017    PCP: Dayton Scrape, [name]  REFERRING PROVIDER: Rennis Chris, MD  REFERRING DIAG: s/p right reverse TSA  THERAPY DIAG:  Acute pain of right shoulder  Stiffness of right shoulder, not elsewhere classified  Localized edema  Cervicalgia  Rationale for Evaluation and Treatment: Rehabilitation  ONSET DATE: 11/05/22  SUBJECTIVE:  SUBJECTIVE STATEMENT:   arm and neck are very sore and tight PERTINENT HISTORY: See above  PAIN:  Are you having pain? Yes: NPRS scale: 6/10 Pain location: right shoulder Pain description: dull ache at rest, sharp with motions Aggravating factors: quick motions, movements pain up to 10/10 Relieving factors: ice, rest, Tylenol at best 3/10  PRECAUTIONS: Shoulder Protocol in the chart  RED FLAGS: None   WEIGHT BEARING RESTRICTIONS: No  FALLS:  Has patient fallen in last 6 months? Yes. Number of falls 1  LIVING ENVIRONMENT: Lives with: lives alone Lives in: House/apartment Stairs: No Has following equipment at home: None  OCCUPATION: retired  PLOF: Independent  PATIENT GOALS:dress without difficulty, do  hair, have good ROM and less pain  NEXT MD VISIT:   OBJECTIVE:   DIAGNOSTIC FINDINGS:  See above  PATIENT SURVEYS:  FOTO 21  COGNITION: Overall cognitive status: Within functional limits for tasks assessed     SENSATION: WFL  POSTURE: Fwd head, rounded shoulders, elevated and guarded shoulder  UPPER EXTREMITY ROM:   Active ROM Right PROM eval Right PROM Supine 01/05/23 PROM  01/26/23 PROM 02/09/23 AROM sitting 02/14/23  Shoulder flexion 35  118  123 90  Shoulder extension 5       Shoulder abduction 20  90 92  73  Shoulder adduction        Shoulder internal rotation 15       Shoulder external rotation 20  45 30  41  Elbow flexion 120       Elbow extension 5       Wrist flexion        Wrist extension        Wrist ulnar deviation        Wrist radial deviation        Wrist pronation        Wrist supination        (Blank rows = not tested)  UPPER EXTREMITY MMT:  No tested due to recent surgery  MMT Right eval Left eval  Shoulder flexion    Shoulder extension    Shoulder abduction    Shoulder adduction    Shoulder internal rotation    Shoulder external rotation    Middle trapezius    Lower trapezius    Elbow flexion    Elbow extension    Wrist flexion    Wrist extension    Wrist ulnar deviation    Wrist radial deviation    Wrist pronation    Wrist supination    Grip strength (lbs)    (Blank rows = not tested)  PALPATION:  Very tight and tender in the pectoral, upper trap, the entire right upper arm   TODAY'S TREATMENT:  DATE:  02/14/23 Weight 192# UBE x 4 minutes Seated row 15# Lats 15# with assist for pulling Wall slides, circles and side to side at the top Red tband IR Red tband ER Red tband horizontal abduction PROM STM to the right upper trap and arm Vaso medium pressure right shoulder 34  degrees  02/09/23 Weight 189# UBE x 4 minutes Seated row 10# Lats 15#  5# straight arm pulls Red tband ER/IR Wall slides with eccentrics Supine 2# bar chest press Supine 2# bar flexion PROM with some light contract relax Vaso medium pressure  02/08/23 UBE level 4 x 4 minutes 2# wate bar overhead reach with assist 2# wate bar easy extension, very gentle 15# lats 2x7 10# seated row 2x10 5# straight arm pulls Wall slides and wall circles Supine 1# ER/IR  2# and 3# punches, isometric circles and flexion Passive stretch to the right shoulder, very guarded  02/02/23 UBE level 2 x 6 minutes Wall slides, circles Yellow tband ER/IR 2# wate bar overhead reach 5# row and extension to neutral Supine punches and flexion PROM, AAROM, gentle stretches STM to the right upper trap Vaso medium pressure  01/31/23 Fast walking with cues for arm swing 5# straight arm pulls to pocket Yellow tband ER/IR at neutral Wall slides and circles Supine 1# and 2# isometric circles Supine 1# flexion small ROM Supine serratus pushes 2# Supine 2# ER from belly to neutral PROM of the right shoulder, joint distraction Vaso medium pressure  01/26/23 UBE level 1 x 6 minutes Red tband row and extension Red tband ER/IR Ball rolling up PROM in supine flexion, abduction and ER, gentle joint distraction Supine AAROM punches to ceiling, then flexion AAROM then trying to put hands on top of head and realx STM to the right upper trap, the right upper arm and the neck area Vaso medium pressure 34 degrees  01/24/23 UBE x 6 minutes, cues to decrease compensation Red tband Row and extension Red tband ER and horizontal abduction Doorway stretch, this really hurt AAROM STM to the right upper trap, rhomboid, teres and right upper arm, very tight and sore Vaso medium pressure  01/18/23 UBE with cues to decrease shoulder elevation x 5 minutes Red tband row and extension Shrugs PROM in supine and in  sitting all motions to her tolerance STM to the right upper arm and the right upper trap and pectoral area Vaso medium pressure right sholder 35 degrees   01/17/23 Walk around the back building cues for arm swing Nustep level 5 x 5 minutes Red tband row and extension Red tband ER/IR with assist Wall slides and circles with assist Ball vs wall Supine punches, isometric circles PROM, gentle distraction, gentle C/R Vaso medium pressure   PATIENT EDUCATION: Education details: poc/hep Person educated: Patient Education method: Programmer, multimedia, Facilities manager, Actor cues, Verbal cues, and Handouts Education comprehension: verbalized understanding  HOME EXERCISE PROGRAM: Access Code: [email or web address] URL: [email or web address] Date: 12/20/2022 Prepared by: Stacie Glaze  Exercises - Seated Shoulder Flexion Towel Slide at Table Top  - 2 x daily - 7 x weekly - 2 sets - 10 reps - 3 hold - Seated Shoulder External Rotation PROM on Table  - 2 x daily - 7 x weekly - 2 sets - 10 reps - 3 hold - Seated Elbow Extension and Shoulder External Rotation AAROM at Table with Towel  - 2 x daily - 7 x weekly - 2 sets - 10 reps - 3 hold - Circular Shoulder Pendulum with Table Support  -  2 x daily - 7 x weekly - 2 sets - 10 reps - 3 hold  ASSESSMENT:  CLINICAL IMPRESSION:  today is the first day to measure AROM and she did well especially with the ER, still painful but much improved.  Today is the first time to measure actively.  Still has some shooting pain in the arm with the ER.   OBJECTIVE IMPAIRMENTS: cardiopulmonary status limiting activity, decreased activity tolerance, decreased endurance, decreased ROM, decreased strength, increased edema, increased muscle spasms, impaired flexibility, impaired UE functional use, improper body mechanics, postural dysfunction, and pain .  REHAB POTENTIAL: Good  CLINICAL DECISION MAKING: Evolving/moderate complexity  EVALUATION COMPLEXITY:  Low   GOALS: Goals reviewed with patient? Yes  SHORT TERM GOALS: Target date: 01/01/23  Independent with initial HEP Goal status: 12/27/22 MET  LONG TERM GOALS: Target date: 03/22/23  Decrease pain 50% Goal status: 02/08/23 progressing  2.  Dress without difficulty Goal status: progressing 02/08/23  3.  Do hair without difficulty Goal status: progressing still difficulty with bra 01/26/23  4.  Increase AROM right shoulder flexion to 130 degrees Goal status: 01/05/23 progressing  5.  Increase right shoulder ER to 60 degrees Goal status: progressing 02/14/23  6.  Return to water aerobics and or gym activity Goal status: INITIAL  PLAN:  PT FREQUENCY: 1-2x/week  PT DURATION: 12 weeks  PLANNED INTERVENTIONS: Therapeutic exercises, Therapeutic activity, Neuromuscular re-education, Balance training, Gait training, Patient/Family education, Self Care, Joint mobilization, Dry Needling, Electrical stimulation, Cryotherapy, Vasopneumatic device, and Manual therapy  PLAN FOR NEXT SESSION:  Continue with the AAROM and slowly add functional strength, measure   Jearld Lesch, PT 02/14/2023, 8:44 AM  [facility] at [facility] [address] [facility]. [place], [place], [address] Phone: [phone]   Fax:  [phone]

## 2023-02-16 ENCOUNTER — Encounter: Payer: Self-pay | Admitting: Physical Therapy

## 2023-02-16 ENCOUNTER — Ambulatory Visit: Payer: Medicare Other | Admitting: Physical Therapy

## 2023-02-16 DIAGNOSIS — R600 Localized edema: Secondary | ICD-10-CM

## 2023-02-16 DIAGNOSIS — M25511 Pain in right shoulder: Secondary | ICD-10-CM

## 2023-02-16 DIAGNOSIS — M542 Cervicalgia: Secondary | ICD-10-CM

## 2023-02-16 DIAGNOSIS — M25611 Stiffness of right shoulder, not elsewhere classified: Secondary | ICD-10-CM | POA: Diagnosis not present

## 2023-02-16 DIAGNOSIS — M6281 Muscle weakness (generalized): Secondary | ICD-10-CM

## 2023-02-16 NOTE — Therapy (Signed)
OUTPATIENT PHYSICAL THERAPY SHOULDER    Patient Name: Veronica Galvan MRN: [id number] DOB:24-Feb-1956, 67 y.o., female Today's Date: 02/16/2023  END OF SESSION:  PT End of Session - 02/16/23 1528     Visit Number 18    Date for PT Re-Evaluation 03/22/23    Authorization Type [employer] [place]    PT Start Time 1528    PT Stop Time 1627    PT Time Calculation (min) 59 min    Activity Tolerance Patient tolerated treatment well    Behavior During Therapy WFL for tasks assessed/performed             Past Medical History:  Diagnosis Date   Allergy    Anxiety    Arthritis    hands   Cataract    Diabetes mellitus without complication (HCC)    type 2   Family history of adverse reaction to anesthesia    son has malignant hyperthermia, daughter does not daughter recently had c section without problems   GERD (gastroesophageal reflux disease)    Headache    sinus   Hyperlipidemia    Hypertension    PONV (postoperative nausea and vomiting)    nausea only   Ulcer, stomach peptic yrs ago   Past Surgical History:  Procedure Laterality Date   APPENDECTOMY     both hells bone spur repair     both heels with metal clips   both shoulder rotator cuff repair     CESAREAN SECTION     x 1   COLONOSCOPY WITH PROPOFOL N/A 09/07/2016   Procedure: COLONOSCOPY WITH PROPOFOL;  Surgeon: Charolett Bumpers, MD;  Location: [facility];  Service: Endoscopy;  Laterality: N/A;   colonscopy  06/2011   polyps   EYE SURGERY     FRACTURE SURGERY     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/10/2022   Procedure: REVERSE SHOULDER ARTHROPLASTY;  Surgeon: Francena Hanly, MD;  Location: [facility];  Service: Orthopedics;  Laterality: Right;  Please follow in room 6 if able   TUBAL LIGATION     VESICO-VAGINAL FISTULA REPAIR     Patient Active Problem List   Diagnosis Date Noted   Gastroesophageal reflux disease 11/16/2021   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 08/18/2021    Dermatofibroma 08/18/2021   History of malignant neoplasm of skin 08/18/2021   Lentigo 08/18/2021   Melanocytic nevi of trunk 08/18/2021   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   Rosacea 08/18/2021   Actinic keratosis 08/18/2021   Sensorineural hearing loss (SNHL) of left ear with unrestricted hearing of right ear 07/26/2021   Tinnitus of left ear 07/26/2021   Acute recurrent maxillary sinusitis 06/22/2021   Primary hypertension 01/12/2021   Hyperlipidemia associated with type 2 diabetes mellitus (HCC) 01/12/2021   Arthritis 01/12/2021   Type II diabetes mellitus (HCC) 11/25/2019   Ingrown toenail 09/05/2018   Neck pain 01/29/2018   Tick bite 10/24/2017    PCP: Dayton Scrape, [name]  REFERRING PROVIDER: Rennis Chris, MD  REFERRING DIAG: s/p right reverse TSA  THERAPY DIAG:  Acute pain of right shoulder  Stiffness of right shoulder, not elsewhere classified  Localized edema  Cervicalgia  Muscle weakness (generalized)  Rationale for Evaluation and Treatment: Rehabilitation  ONSET DATE: 11/05/22  SUBJECTIVE:  SUBJECTIVE STATEMENT:   reports very sore, washed, scrubbed and poked holes in 30 potatoes, did a lot of cooking, more c/o right upper arm pain PERTINENT HISTORY: See above  PAIN:  Are you having pain? Yes: NPRS scale: 6/10 Pain location: right shoulder Pain description: dull ache at rest, sharp with motions Aggravating factors: quick motions, movements pain up to 10/10 Relieving factors: ice, rest, Tylenol at best 3/10  PRECAUTIONS: Shoulder Protocol in the chart  RED FLAGS: None   WEIGHT BEARING RESTRICTIONS: No  FALLS:  Has patient fallen in last 6 months? Yes. Number of falls 1  LIVING ENVIRONMENT: Lives with: lives alone Lives in: House/apartment Stairs: No Has  following equipment at home: None  OCCUPATION: retired  PLOF: Independent  PATIENT GOALS:dress without difficulty, do hair, have good ROM and less pain  NEXT MD VISIT:   OBJECTIVE:   DIAGNOSTIC FINDINGS:  See above  PATIENT SURVEYS:  FOTO 21  COGNITION: Overall cognitive status: Within functional limits for tasks assessed     SENSATION: WFL  POSTURE: Fwd head, rounded shoulders, elevated and guarded shoulder  UPPER EXTREMITY ROM:   Active ROM Right PROM eval Right PROM Supine 01/05/23 PROM  01/26/23 PROM 02/09/23 AROM sitting 02/14/23  Shoulder flexion 35  118  123 90  Shoulder extension 5       Shoulder abduction 20  90 92  73  Shoulder adduction        Shoulder internal rotation 15       Shoulder external rotation 20  45 30  41  Elbow flexion 120       Elbow extension 5       Wrist flexion        Wrist extension        Wrist ulnar deviation        Wrist radial deviation        Wrist pronation        Wrist supination        (Blank rows = not tested)  UPPER EXTREMITY MMT:  No tested due to recent surgery  MMT Right eval Left eval  Shoulder flexion    Shoulder extension    Shoulder abduction    Shoulder adduction    Shoulder internal rotation    Shoulder external rotation    Middle trapezius    Lower trapezius    Elbow flexion    Elbow extension    Wrist flexion    Wrist extension    Wrist ulnar deviation    Wrist radial deviation    Wrist pronation    Wrist supination    Grip strength (lbs)    (Blank rows = not tested)  PALPATION:  Very tight and tender in the pectoral, upper trap, the entire right upper arm   TODAY'S TREATMENT:  DATE:  02/16/23 Nustep level 5 x 5 minutes Finger ladder Seated row 15# Lats 15# Red tband ER PROM right shoulder in supine, gentle joint mobs STM to the right upper trap,  rhomboid, neck and the right upper arm Vaso medium pressure  35 degrees  02/14/23 Weight 192# UBE x 4 minutes Seated row 15# Lats 15# with assist for pulling Wall slides, circles and side to side at the top Red tband IR Red tband ER Red tband horizontal abduction PROM STM to the right upper trap and arm Vaso medium pressure right shoulder 34 degrees  02/09/23 Weight 189# UBE x 4 minutes Seated row 10# Lats 15#  5# straight arm pulls Red tband ER/IR Wall slides with eccentrics Supine 2# bar chest press Supine 2# bar flexion PROM with some light contract relax Vaso medium pressure  02/08/23 UBE level 4 x 4 minutes 2# wate bar overhead reach with assist 2# wate bar easy extension, very gentle 15# lats 2x7 10# seated row 2x10 5# straight arm pulls Wall slides and wall circles Supine 1# ER/IR  2# and 3# punches, isometric circles and flexion Passive stretch to the right shoulder, very guarded  02/02/23 UBE level 2 x 6 minutes Wall slides, circles Yellow tband ER/IR 2# wate bar overhead reach 5# row and extension to neutral Supine punches and flexion PROM, AAROM, gentle stretches STM to the right upper trap Vaso medium pressure  01/31/23 Fast walking with cues for arm swing 5# straight arm pulls to pocket Yellow tband ER/IR at neutral Wall slides and circles Supine 1# and 2# isometric circles Supine 1# flexion small ROM Supine serratus pushes 2# Supine 2# ER from belly to neutral PROM of the right shoulder, joint distraction Vaso medium pressure  01/26/23 UBE level 1 x 6 minutes Red tband row and extension Red tband ER/IR Ball rolling up PROM in supine flexion, abduction and ER, gentle joint distraction Supine AAROM punches to ceiling, then flexion AAROM then trying to put hands on top of head and realx STM to the right upper trap, the right upper arm and the neck area Vaso medium pressure 34 degrees  01/24/23 UBE x 6 minutes, cues to decrease  compensation Red tband Row and extension Red tband ER and horizontal abduction Doorway stretch, this really hurt AAROM STM to the right upper trap, rhomboid, teres and right upper arm, very tight and sore Vaso medium pressure  01/18/23 UBE with cues to decrease shoulder elevation x 5 minutes Red tband row and extension Shrugs PROM in supine and in sitting all motions to her tolerance STM to the right upper arm and the right upper trap and pectoral area Vaso medium pressure right sholder 35 degrees   01/17/23 Walk around the back building cues for arm swing Nustep level 5 x 5 minutes Red tband row and extension Red tband ER/IR with assist Wall slides and circles with assist Ball vs wall Supine punches, isometric circles PROM, gentle distraction, gentle C/R Vaso medium pressure   PATIENT EDUCATION: Education details: poc/hep Person educated: Patient Education method: Programmer, multimedia, Facilities manager, Actor cues, Verbal cues, and Handouts Education comprehension: verbalized understanding  HOME EXERCISE PROGRAM: Access Code: [email or web address] URL: [email or web address] Date: 12/20/2022 Prepared by: Stacie Glaze  Exercises - Seated Shoulder Flexion Towel Slide at Table Top  - 2 x daily - 7 x weekly - 2 sets - 10 reps - 3 hold - Seated Shoulder External Rotation PROM on Table  - 2 x daily - 7 x weekly - 2 sets -  10 reps - 3 hold - Seated Elbow Extension and Shoulder External Rotation AAROM at Table with Towel  - 2 x daily - 7 x weekly - 2 sets - 10 reps - 3 hold - Circular Shoulder Pendulum with Table Support  - 2 x daily - 7 x weekly - 2 sets - 10 reps - 3 hold  ASSESSMENT:  CLINICAL IMPRESSION:  tVery sore today after doing a lot of food prep and cooking, she does have very tender knots in the right upper arm, after I worked on these her ER was better, demonstrated by easier to reach up to her hair without bending her head down   OBJECTIVE IMPAIRMENTS: cardiopulmonary  status limiting activity, decreased activity tolerance, decreased endurance, decreased ROM, decreased strength, increased edema, increased muscle spasms, impaired flexibility, impaired UE functional use, improper body mechanics, postural dysfunction, and pain .  REHAB POTENTIAL: Good  CLINICAL DECISION MAKING: Evolving/moderate complexity  EVALUATION COMPLEXITY: Low   GOALS: Goals reviewed with patient? Yes  SHORT TERM GOALS: Target date: 01/01/23  Independent with initial HEP Goal status: 12/27/22 MET  LONG TERM GOALS: Target date: 03/22/23  Decrease pain 50% Goal status: 02/08/23 progressing  2.  Dress without difficulty Goal status: progressing 02/08/23  3.  Do hair without difficulty Goal status: progressing still difficulty with bra 01/26/23  4.  Increase AROM right shoulder flexion to 130 degrees Goal status: 01/05/23 progressing  5.  Increase right shoulder ER to 60 degrees Goal status: progressing 02/14/23  6.  Return to water aerobics and or gym activity Goal status: INITIAL  PLAN:  PT FREQUENCY: 1-2x/week  PT DURATION: 12 weeks  PLANNED INTERVENTIONS: Therapeutic exercises, Therapeutic activity, Neuromuscular re-education, Balance training, Gait training, Patient/Family education, Self Care, Joint mobilization, Dry Needling, Electrical stimulation, Cryotherapy, Vasopneumatic device, and Manual therapy  PLAN FOR NEXT SESSION:  Continue with the AAROM and slowly add functional strength, measure   Jearld Lesch, PT 02/16/2023, 3:31 PM [place] [facility] [address] [facility] [place], [place], [address] Phone: [phone]   Fax:  [phone]

## 2023-02-21 ENCOUNTER — Ambulatory Visit: Payer: Medicare Other | Admitting: Physical Therapy

## 2023-02-21 ENCOUNTER — Encounter: Payer: Self-pay | Admitting: Physical Therapy

## 2023-02-21 DIAGNOSIS — R600 Localized edema: Secondary | ICD-10-CM

## 2023-02-21 DIAGNOSIS — M25611 Stiffness of right shoulder, not elsewhere classified: Secondary | ICD-10-CM

## 2023-02-21 DIAGNOSIS — M25511 Pain in right shoulder: Secondary | ICD-10-CM

## 2023-02-21 DIAGNOSIS — M542 Cervicalgia: Secondary | ICD-10-CM | POA: Diagnosis not present

## 2023-02-21 DIAGNOSIS — M6281 Muscle weakness (generalized): Secondary | ICD-10-CM | POA: Diagnosis not present

## 2023-02-21 NOTE — Therapy (Signed)
OUTPATIENT PHYSICAL THERAPY SHOULDER    Patient Name: Veronica Galvan MRN: [id number] DOB:[birth year], 67 y.o., female Today's Date: 02/21/2023  END OF SESSION:  PT End of Session - 02/21/23 1657     Visit Number 19    Date for PT Re-Evaluation 03/22/23    Authorization Type [employer] [place]    PT Start Time 1657    PT Stop Time 1752    PT Time Calculation (min) 55 min    Activity Tolerance Patient tolerated treatment well    Behavior During Therapy WFL for tasks assessed/performed             Past Medical History:  Diagnosis Date   Allergy    Anxiety    Arthritis    hands   Cataract    Diabetes mellitus without complication (HCC)    type 2   Family history of adverse reaction to anesthesia    son has malignant hyperthermia, daughter does not daughter recently had c section without problems   GERD (gastroesophageal reflux disease)    Headache    sinus   Hyperlipidemia    Hypertension    PONV (postoperative nausea and vomiting)    nausea only   Ulcer, stomach peptic yrs ago   Past Surgical History:  Procedure Laterality Date   APPENDECTOMY     both hells bone spur repair     both heels with metal clips   both shoulder rotator cuff repair     CESAREAN SECTION     x 1   COLONOSCOPY WITH PROPOFOL N/A 09/07/2016   Procedure: COLONOSCOPY WITH PROPOFOL;  Surgeon: Charolett Bumpers, MD;  Location: [facility];  Service: Endoscopy;  Laterality: N/A;   colonscopy  06/2011   polyps   EYE SURGERY     FRACTURE SURGERY     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/10/2022   Procedure: REVERSE SHOULDER ARTHROPLASTY;  Surgeon: Francena Hanly, MD;  Location: [facility];  Service: Orthopedics;  Laterality: Right;  Please follow in room 6 if able   TUBAL LIGATION     VESICO-VAGINAL FISTULA REPAIR     Patient Active Problem List   Diagnosis Date Noted   Gastroesophageal reflux disease 11/16/2021   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 08/18/2021    Dermatofibroma 08/18/2021   History of malignant neoplasm of skin 08/18/2021   Lentigo 08/18/2021   Melanocytic nevi of trunk 08/18/2021   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   Rosacea 08/18/2021   Actinic keratosis 08/18/2021   Sensorineural hearing loss (SNHL) of left ear with unrestricted hearing of right ear 07/26/2021   Tinnitus of left ear 07/26/2021   Acute recurrent maxillary sinusitis 06/22/2021   Primary hypertension 01/12/2021   Hyperlipidemia associated with type 2 diabetes mellitus (HCC) 01/12/2021   Arthritis 01/12/2021   Type II diabetes mellitus (HCC) 11/25/2019   Ingrown toenail 09/05/2018   Neck pain 01/29/2018   Tick bite 10/24/2017    PCP: Dayton Scrape, [name]  REFERRING PROVIDER: Rennis Chris, MD  REFERRING DIAG: s/p right reverse TSA  THERAPY DIAG:  Acute pain of right shoulder  Stiffness of right shoulder, not elsewhere classified  Localized edema  Cervicalgia  Rationale for Evaluation and Treatment: Rehabilitation  ONSET DATE: 11/05/22  SUBJECTIVE:  SUBJECTIVE STATEMENT:   I felt better after the last treatment, felt like I had better ROM, I could reach the top of my head. PERTINENT HISTORY: See above  PAIN:  Are you having pain? Yes: NPRS scale: 6/10 Pain location: right shoulder Pain description: dull ache at rest, sharp with motions Aggravating factors: quick motions, movements pain up to 10/10 Relieving factors: ice, rest, Tylenol at best 3/10  PRECAUTIONS: Shoulder Protocol in the chart  RED FLAGS: None   WEIGHT BEARING RESTRICTIONS: No  FALLS:  Has patient fallen in last 6 months? Yes. Number of falls 1  LIVING ENVIRONMENT: Lives with: lives alone Lives in: House/apartment Stairs: No Has following equipment at home:  None  OCCUPATION: retired  PLOF: Independent  PATIENT GOALS:dress without difficulty, do hair, have good ROM and less pain  NEXT MD VISIT:   OBJECTIVE:   DIAGNOSTIC FINDINGS:  See above  PATIENT SURVEYS:  FOTO 21  COGNITION: Overall cognitive status: Within functional limits for tasks assessed     SENSATION: WFL  POSTURE: Fwd head, rounded shoulders, elevated and guarded shoulder  UPPER EXTREMITY ROM:   Active ROM Right PROM eval Right PROM Supine 01/05/23 PROM  01/26/23 PROM 02/09/23 AROM sitting 02/14/23  Shoulder flexion 35  118  123 90  Shoulder extension 5       Shoulder abduction 20  90 92  73  Shoulder adduction        Shoulder internal rotation 15       Shoulder external rotation 20  45 30  41  Elbow flexion 120       Elbow extension 5       Wrist flexion        Wrist extension        Wrist ulnar deviation        Wrist radial deviation        Wrist pronation        Wrist supination        (Blank rows = not tested)  UPPER EXTREMITY MMT:  No tested due to recent surgery  MMT Right eval Left eval  Shoulder flexion    Shoulder extension    Shoulder abduction    Shoulder adduction    Shoulder internal rotation    Shoulder external rotation    Middle trapezius    Lower trapezius    Elbow flexion    Elbow extension    Wrist flexion    Wrist extension    Wrist ulnar deviation    Wrist radial deviation    Wrist pronation    Wrist supination    Grip strength (lbs)    (Blank rows = not tested)  PALPATION:  Very tight and tender in the pectoral, upper trap, the entire right upper arm   TODAY'S TREATMENT:  DATE:  02/21/23 UBE level 3 x 6 minutes Supine chest press to shoulder flexion 2# x12, 3# x12 SA punches 2# x10, 3# x10 Supine circles 2# then 3# clockwise and counter PROM right shoulder in supine, gentle  joint mobs MET into ER pushing into further ranges for IR  Vaso medium pressure 36 degrees  02/16/23 Nustep level 5 x 5 minutes Finger ladder Seated row 15# Lats 15# Red tband ER PROM right shoulder in supine, gentle joint mobs STM to the right upper trap, rhomboid, neck and the right upper arm Vaso medium pressure  35 degrees  02/14/23 Weight 192# UBE x 4 minutes Seated row 15# Lats 15# with assist for pulling Wall slides, circles and side to side at the top Red tband IR Red tband ER Red tband horizontal abduction PROM STM to the right upper trap and arm Vaso medium pressure right shoulder 34 degrees  02/09/23 Weight 189# UBE x 4 minutes Seated row 10# Lats 15#  5# straight arm pulls Red tband ER/IR Wall slides with eccentrics Supine 2# bar chest press Supine 2# bar flexion PROM with some light contract relax Vaso medium pressure  02/08/23 UBE level 4 x 4 minutes 2# wate bar overhead reach with assist 2# wate bar easy extension, very gentle 15# lats 2x7 10# seated row 2x10 5# straight arm pulls Wall slides and wall circles Supine 1# ER/IR  2# and 3# punches, isometric circles and flexion Passive stretch to the right shoulder, very guarded  02/02/23 UBE level 2 x 6 minutes Wall slides, circles Yellow tband ER/IR 2# wate bar overhead reach 5# row and extension to neutral Supine punches and flexion PROM, AAROM, gentle stretches STM to the right upper trap Vaso medium pressure  01/31/23 Fast walking with cues for arm swing 5# straight arm pulls to pocket Yellow tband ER/IR at neutral Wall slides and circles Supine 1# and 2# isometric circles Supine 1# flexion small ROM Supine serratus pushes 2# Supine 2# ER from belly to neutral PROM of the right shoulder, joint distraction Vaso medium pressure  01/26/23 UBE level 1 x 6 minutes Red tband row and extension Red tband ER/IR Ball rolling up PROM in supine flexion, abduction and ER, gentle joint  distraction Supine AAROM punches to ceiling, then flexion AAROM then trying to put hands on top of head and realx STM to the right upper trap, the right upper arm and the neck area Vaso medium pressure 34 degrees  01/24/23 UBE x 6 minutes, cues to decrease compensation Red tband Row and extension Red tband ER and horizontal abduction Doorway stretch, this really hurt AAROM STM to the right upper trap, rhomboid, teres and right upper arm, very tight and sore Vaso medium pressure  01/18/23 UBE with cues to decrease shoulder elevation x 5 minutes Red tband row and extension Shrugs PROM in supine and in sitting all motions to her tolerance STM to the right upper arm and the right upper trap and pectoral area Vaso medium pressure right sholder 35 degrees   01/17/23 Walk around the back building cues for arm swing Nustep level 5 x 5 minutes Red tband row and extension Red tband ER/IR with assist Wall slides and circles with assist Ball vs wall Supine punches, isometric circles PROM, gentle distraction, gentle C/R Vaso medium pressure   PATIENT EDUCATION: Education details: poc/hep Person educated: Patient Education method: Programmer, multimedia, Facilities manager, Actor cues, Verbal cues, and Handouts Education comprehension: verbalized understanding  HOME EXERCISE PROGRAM: Access Code: [email or web address] URL: [email or web address]  Date: 12/20/2022 Prepared by: Stacie Glaze  Exercises - Seated Shoulder Flexion Towel Slide at Table Top  - 2 x daily - 7 x weekly - 2 sets - 10 reps - 3 hold - Seated Shoulder External Rotation PROM on Table  - 2 x daily - 7 x weekly - 2 sets - 10 reps - 3 hold - Seated Elbow Extension and Shoulder External Rotation AAROM at Table with Towel  - 2 x daily - 7 x weekly - 2 sets - 10 reps - 3 hold - Circular Shoulder Pendulum with Table Support  - 2 x daily - 7 x weekly - 2 sets - 10 reps - 3 hold  ASSESSMENT:  CLINICAL IMPRESSION:  Reports less  pain after the STM to the right upper arm last time, reports was able to reach her head easier, reports that she was away over the weekend and is now tight and sore again OBJECTIVE IMPAIRMENTS: cardiopulmonary status limiting activity, decreased activity tolerance, decreased endurance, decreased ROM, decreased strength, increased edema, increased muscle spasms, impaired flexibility, impaired UE functional use, improper body mechanics, postural dysfunction, and pain .  REHAB POTENTIAL: Good  CLINICAL DECISION MAKING: Evolving/moderate complexity  EVALUATION COMPLEXITY: Low   GOALS: Goals reviewed with patient? Yes  SHORT TERM GOALS: Target date: 01/01/23  Independent with initial HEP Goal status: 12/27/22 MET  LONG TERM GOALS: Target date: 03/22/23  Decrease pain 50% Goal status: 02/08/23 progressing  2.  Dress without difficulty Goal status: progressing 02/08/23  3.  Do hair without difficulty Goal status: progressing still difficulty with bra 01/26/23  4.  Increase AROM right shoulder flexion to 130 degrees Goal status: 01/05/23 progressing  5.  Increase right shoulder ER to 60 degrees Goal status: progressing 02/14/23  6.  Return to water aerobics and or gym activity Goal status: INITIAL  PLAN:  PT FREQUENCY: 1-2x/week  PT DURATION: 12 weeks  PLANNED INTERVENTIONS: Therapeutic exercises, Therapeutic activity, Neuromuscular re-education, Balance training, Gait training, Patient/Family education, Self Care, Joint mobilization, Dry Needling, Electrical stimulation, Cryotherapy, Vasopneumatic device, and Manual therapy  PLAN FOR NEXT SESSION:  Continue with the AAROM and slowly add functional strength, measure   Jearld Lesch, PT 02/21/2023, 4:57 PM  [facility] at [facility] [address] [facility]. [place], [place], [address] Phone: [phone]   Fax:  [phone]

## 2023-02-23 ENCOUNTER — Encounter: Payer: Self-pay | Admitting: Physical Therapy

## 2023-02-23 ENCOUNTER — Ambulatory Visit: Payer: Medicare Other | Admitting: Physical Therapy

## 2023-02-23 DIAGNOSIS — M6281 Muscle weakness (generalized): Secondary | ICD-10-CM | POA: Diagnosis not present

## 2023-02-23 DIAGNOSIS — M25511 Pain in right shoulder: Secondary | ICD-10-CM | POA: Diagnosis not present

## 2023-02-23 DIAGNOSIS — M25611 Stiffness of right shoulder, not elsewhere classified: Secondary | ICD-10-CM

## 2023-02-23 DIAGNOSIS — M542 Cervicalgia: Secondary | ICD-10-CM | POA: Diagnosis not present

## 2023-02-23 DIAGNOSIS — R600 Localized edema: Secondary | ICD-10-CM

## 2023-02-23 NOTE — Therapy (Signed)
OUTPATIENT PHYSICAL THERAPY SHOULDER  Progress Note Reporting Period 01/24/23 to 02/23/23 for visits 11-20  See note below for Objective Data and Assessment of Progress/Goals.      Patient Name: Veronica Galvan MRN: [id number] DOB:[birth year], 67 y.o., female Today's Date: 02/23/2023  END OF SESSION:  PT End of Session - 02/23/23 0801     Visit Number 20    Date for PT Re-Evaluation 03/22/23    Authorization Type [employer] [place]    PT Start Time 0800    PT Stop Time 0900    PT Time Calculation (min) 60 min    Activity Tolerance Patient tolerated treatment well    Behavior During Therapy WFL for tasks assessed/performed             Past Medical History:  Diagnosis Date   Allergy    Anxiety    Arthritis    hands   Cataract    Diabetes mellitus without complication (HCC)    type 2   Family history of adverse reaction to anesthesia    son has malignant hyperthermia, daughter does not daughter recently had c section without problems   GERD (gastroesophageal reflux disease)    Headache    sinus   Hyperlipidemia    Hypertension    PONV (postoperative nausea and vomiting)    nausea only   Ulcer, stomach peptic yrs ago   Past Surgical History:  Procedure Laterality Date   APPENDECTOMY     both hells bone spur repair     both heels with metal clips   both shoulder rotator cuff repair     CESAREAN SECTION     x 1   COLONOSCOPY WITH PROPOFOL N/A 09/07/2016   Procedure: COLONOSCOPY WITH PROPOFOL;  Surgeon: Charolett Bumpers, MD;  Location: [facility];  Service: Endoscopy;  Laterality: N/A;   colonscopy  06/2011   polyps   EYE SURGERY     FRACTURE SURGERY     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/10/2022   Procedure: REVERSE SHOULDER ARTHROPLASTY;  Surgeon: Francena Hanly, MD;  Location: [facility];  Service: Orthopedics;  Laterality: Right;  Please follow in room 6 if able   TUBAL LIGATION     VESICO-VAGINAL FISTULA REPAIR     Patient Active Problem List    Diagnosis Date Noted   Gastroesophageal reflux disease 11/16/2021   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 08/18/2021   Dermatofibroma 08/18/2021   History of malignant neoplasm of skin 08/18/2021   Lentigo 08/18/2021   Melanocytic nevi of trunk 08/18/2021   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   Rosacea 08/18/2021   Actinic keratosis 08/18/2021   Sensorineural hearing loss (SNHL) of left ear with unrestricted hearing of right ear 07/26/2021   Tinnitus of left ear 07/26/2021   Acute recurrent maxillary sinusitis 06/22/2021   Primary hypertension 01/12/2021   Hyperlipidemia associated with type 2 diabetes mellitus (HCC) 01/12/2021   Arthritis 01/12/2021   Type II diabetes mellitus (HCC) 11/25/2019   Ingrown toenail 09/05/2018   Neck pain 01/29/2018   Tick bite 10/24/2017    PCP: Dayton Scrape, [name]  REFERRING PROVIDER: Rennis Chris, MD  REFERRING DIAG: s/p right reverse TSA  THERAPY DIAG:  Acute pain of right shoulder  Stiffness of right shoulder, not elsewhere classified  Localized edema  Cervicalgia  Muscle weakness (generalized)  Rationale for Evaluation and Treatment: Rehabilitation  ONSET DATE: 11/05/22  SUBJECTIVE:  SUBJECTIVE STATEMENT:   I fhave been [place] nd going a lot with travel to [place] and to see Grandkids.  I am tired and tight  PERTINENT HISTORY: See above  PAIN:  Are you having pain? Yes: NPRS scale: 8/10 Pain location: right shoulder Pain description: dull ache at rest, sharp with motions Aggravating factors: quick motions, movements pain up to 10/10 Relieving factors: ice, rest, Tylenol at best 3/10  PRECAUTIONS: Shoulder Protocol in the chart  RED FLAGS: None   WEIGHT BEARING RESTRICTIONS: No  FALLS:  Has patient fallen in last 6  months? Yes. Number of falls 1  LIVING ENVIRONMENT: Lives with: lives alone Lives in: House/apartment Stairs: No Has following equipment at home: None  OCCUPATION: retired  PLOF: Independent  PATIENT GOALS:dress without difficulty, do hair, have good ROM and less pain  NEXT MD VISIT:   OBJECTIVE:   DIAGNOSTIC FINDINGS:  See above  PATIENT SURVEYS:  FOTO 21  COGNITION: Overall cognitive status: Within functional limits for tasks assessed     SENSATION: WFL  POSTURE: Fwd head, rounded shoulders, elevated and guarded shoulder  UPPER EXTREMITY ROM:   Active ROM Right PROM eval Right PROM Supine 01/05/23 PROM  01/26/23 PROM 02/09/23 AROM sitting 02/14/23 AROM 02/21/23  Shoulder flexion 35  118  123 90 96  Shoulder extension 5        Shoulder abduction 20  90 92  73 80  Shoulder adduction         Shoulder internal rotation 15        Shoulder external rotation 20  45 30  41   Elbow flexion 120        Elbow extension 5        Wrist flexion         Wrist extension         Wrist ulnar deviation         Wrist radial deviation         Wrist pronation         Wrist supination         (Blank rows = not tested)  UPPER EXTREMITY MMT:  No tested due to recent surgery  MMT Right eval Left eval  Shoulder flexion    Shoulder extension    Shoulder abduction    Shoulder adduction    Shoulder internal rotation    Shoulder external rotation    Middle trapezius    Lower trapezius    Elbow flexion    Elbow extension    Wrist flexion    Wrist extension    Wrist ulnar deviation    Wrist radial deviation    Wrist pronation    Wrist supination    Grip strength (lbs)    (Blank rows = not tested)  PALPATION:  Very tight and tender in the pectoral, upper trap, the entire right upper arm   TODAY'S TREATMENT:  DATE:  02/23/23 UBE  level 3 x 6 mintues Seated rows 15# 2x10 Lats 15# 2x10 Wall slides, circles Red tband ER/IR 1# and 2# cabinet reach to face high shelf Seated 1# ER at 30 degrees abduction AAROM flexion, abduction and ER Vaso right shoulder in sitting low pressure 34 degrees  02/21/23 UBE level 3 x 6 minutes Supine chest press to shoulder flexion 2# x12, 3# x12 SA punches 2# x10, 3# x10 Supine circles 2# then 3# clockwise and counter PROM right shoulder in supine, gentle joint mobs MET into ER pushing into further ranges for IR  Vaso medium pressure 36 degrees  02/16/23 Nustep level 5 x 5 minutes Finger ladder Seated row 15# Lats 15# Red tband ER PROM right shoulder in supine, gentle joint mobs STM to the right upper trap, rhomboid, neck and the right upper arm Vaso medium pressure  35 degrees  02/14/23 Weight 192# UBE x 4 minutes Seated row 15# Lats 15# with assist for pulling Wall slides, circles and side to side at the top Red tband IR Red tband ER Red tband horizontal abduction PROM STM to the right upper trap and arm Vaso medium pressure right shoulder 34 degrees  02/09/23 Weight 189# UBE x 4 minutes Seated row 10# Lats 15#  5# straight arm pulls Red tband ER/IR Wall slides with eccentrics Supine 2# bar chest press Supine 2# bar flexion PROM with some light contract relax Vaso medium pressure  02/08/23 UBE level 4 x 4 minutes 2# wate bar overhead reach with assist 2# wate bar easy extension, very gentle 15# lats 2x7 10# seated row 2x10 5# straight arm pulls Wall slides and wall circles Supine 1# ER/IR  2# and 3# punches, isometric circles and flexion Passive stretch to the right shoulder, very guarded  02/02/23 UBE level 2 x 6 minutes Wall slides, circles Yellow tband ER/IR 2# wate bar overhead reach 5# row and extension to neutral Supine punches and flexion PROM, AAROM, gentle stretches STM to the right upper trap Vaso medium pressure  01/31/23 Fast  walking with cues for arm swing 5# straight arm pulls to pocket Yellow tband ER/IR at neutral Wall slides and circles Supine 1# and 2# isometric circles Supine 1# flexion small ROM Supine serratus pushes 2# Supine 2# ER from belly to neutral PROM of the right shoulder, joint distraction Vaso medium pressure  01/26/23 UBE level 1 x 6 minutes Red tband row and extension Red tband ER/IR Ball rolling up PROM in supine flexion, abduction and ER, gentle joint distraction Supine AAROM punches to ceiling, then flexion AAROM then trying to put hands on top of head and realx STM to the right upper trap, the right upper arm and the neck area Vaso medium pressure 34 degrees  01/24/23 UBE x 6 minutes, cues to decrease compensation Red tband Row and extension Red tband ER and horizontal abduction Doorway stretch, this really hurt AAROM STM to the right upper trap, rhomboid, teres and right upper arm, very tight and sore Vaso medium pressure  PATIENT EDUCATION: Education details: poc/hep Person educated: Patient Education method: Programmer, multimedia, Facilities manager, Actor cues, Verbal cues, and Handouts Education comprehension: verbalized understanding  HOME EXERCISE PROGRAM: Access Code: [email or web address] URL: [email or web address] Date: 12/20/2022 Prepared by: Stacie Glaze  Exercises - Seated Shoulder Flexion Towel Slide at Table Top  - 2 x daily - 7 x weekly - 2 sets - 10 reps - 3 hold - Seated Shoulder External Rotation PROM on Table  - 2 x daily -  7 x weekly - 2 sets - 10 reps - 3 hold - Seated Elbow Extension and Shoulder External Rotation AAROM at Table with Towel  - 2 x daily - 7 x weekly - 2 sets - 10 reps - 3 hold - Circular Shoulder Pendulum with Table Support  - 2 x daily - 7 x weekly - 2 sets - 10 reps - 3 hold  ASSESSMENT:  CLINICAL IMPRESSION:  Reports that she is very active and sore, She is improving and reports that she can wash her face with two hands, has been  able to reach hair some.  Still knots in the lateral right upper arm.  She is improving in the AROM/AAROM.  Still very limited and tends to need cues to relax and to have less compensation  OBJECTIVE IMPAIRMENTS: cardiopulmonary status limiting activity, decreased activity tolerance, decreased endurance, decreased ROM, decreased strength, increased edema, increased muscle spasms, impaired flexibility, impaired UE functional use, improper body mechanics, postural dysfunction, and pain .  REHAB POTENTIAL: Good  CLINICAL DECISION MAKING: Evolving/moderate complexity  EVALUATION COMPLEXITY: Low   GOALS: Goals reviewed with patient? Yes  SHORT TERM GOALS: Target date: 01/01/23  Independent with initial HEP Goal status: 12/27/22 MET  LONG TERM GOALS: Target date: 03/22/23  Decrease pain 50% Goal status: 02/23/23 progressing  2.  Dress without difficulty Goal status: progressing 02/23/23  3.  Do hair without difficulty Goal status: able to reach the back of her head at times 02/23/23  4.  Increase AROM right shoulder flexion to 130 degrees Goal status: 02/23/23 progressing  5.  Increase right shoulder ER to 60 degrees Goal status: progressing 02/14/23  6.  Return to water aerobics and or gym activity Goal status: INITIAL  PLAN:  PT FREQUENCY: 1-2x/week  PT DURATION: 12 weeks  PLANNED INTERVENTIONS: Therapeutic exercises, Therapeutic activity, Neuromuscular re-education, Balance training, Gait training, Patient/Family education, Self Care, Joint mobilization, Dry Needling, Electrical stimulation, Cryotherapy, Vasopneumatic device, and Manual therapy  PLAN FOR NEXT SESSION: starting to add functional strength   Jearld Lesch, PT 02/23/2023, 8:01 AM [facility]. [place], [place], [address] Phone: [phone]   Fax:  [phone]

## 2023-02-28 ENCOUNTER — Encounter: Payer: Self-pay | Admitting: Physical Therapy

## 2023-02-28 ENCOUNTER — Ambulatory Visit: Payer: Medicare Other | Admitting: Physical Therapy

## 2023-02-28 DIAGNOSIS — M6281 Muscle weakness (generalized): Secondary | ICD-10-CM | POA: Diagnosis not present

## 2023-02-28 DIAGNOSIS — R600 Localized edema: Secondary | ICD-10-CM

## 2023-02-28 DIAGNOSIS — M25611 Stiffness of right shoulder, not elsewhere classified: Secondary | ICD-10-CM | POA: Diagnosis not present

## 2023-02-28 DIAGNOSIS — M25511 Pain in right shoulder: Secondary | ICD-10-CM

## 2023-02-28 DIAGNOSIS — M542 Cervicalgia: Secondary | ICD-10-CM

## 2023-02-28 NOTE — Therapy (Signed)
OUTPATIENT PHYSICAL THERAPY SHOULDER    Patient Name: Veronica Galvan MRN: [id number] DOB:04/01/1956, 67 y.o., female Today's Date: 02/28/2023  END OF SESSION:  PT End of Session - 02/28/23 0758     Visit Number 21    Date for PT Re-Evaluation 03/22/23    Authorization Type [employer]    PT Start Time [phone]    PT Stop Time 0900    PT Time Calculation (min) 62 min    Activity Tolerance Patient tolerated treatment well    Behavior During Therapy WFL for tasks assessed/performed             Past Medical History:  Diagnosis Date   Allergy    Anxiety    Arthritis    hands   Cataract    Diabetes mellitus without complication (HCC)    type 2   Family history of adverse reaction to anesthesia    son has malignant hyperthermia, daughter does not daughter recently had c section without problems   GERD (gastroesophageal reflux disease)    Headache    sinus   Hyperlipidemia    Hypertension    PONV (postoperative nausea and vomiting)    nausea only   Ulcer, stomach peptic yrs ago   Past Surgical History:  Procedure Laterality Date   APPENDECTOMY     both hells bone spur repair     both heels with metal clips   both shoulder rotator cuff repair     CESAREAN SECTION     x 1   COLONOSCOPY WITH PROPOFOL N/A 09/07/2016   Procedure: COLONOSCOPY WITH PROPOFOL;  Surgeon: Charolett Bumpers, MD;  Location: [facility];  Service: Endoscopy;  Laterality: N/A;   colonscopy  06/2011   polyps   EYE SURGERY     FRACTURE SURGERY     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/10/2022   Procedure: REVERSE SHOULDER ARTHROPLASTY;  Surgeon: Francena Hanly, MD;  Location: [facility];  Service: Orthopedics;  Laterality: Right;  Please follow in room 6 if able   TUBAL LIGATION     VESICO-VAGINAL FISTULA REPAIR     Patient Active Problem List   Diagnosis Date Noted   Gastroesophageal reflux disease 11/16/2021   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 08/18/2021    Dermatofibroma 08/18/2021   History of malignant neoplasm of skin 08/18/2021   Lentigo 08/18/2021   Melanocytic nevi of trunk 08/18/2021   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   Rosacea 08/18/2021   Actinic keratosis 08/18/2021   Sensorineural hearing loss (SNHL) of left ear with unrestricted hearing of right ear 07/26/2021   Tinnitus of left ear 07/26/2021   Acute recurrent maxillary sinusitis 06/22/2021   Primary hypertension 01/12/2021   Hyperlipidemia associated with type 2 diabetes mellitus (HCC) 01/12/2021   Arthritis 01/12/2021   Type II diabetes mellitus (HCC) 11/25/2019   Ingrown toenail 09/05/2018   Neck pain 01/29/2018   Tick bite 10/24/2017    PCP: Dayton Scrape, [name]  REFERRING PROVIDER: Rennis Chris, MD  REFERRING DIAG: s/p right reverse TSA  THERAPY DIAG:  Acute pain of right shoulder  Stiffness of right shoulder, not elsewhere classified  Localized edema  Cervicalgia  Muscle weakness (generalized)  Rationale for Evaluation and Treatment: Rehabilitation  ONSET DATE: 11/05/22  SUBJECTIVE:  SUBJECTIVE STATEMENT:   Doing okay, still a little motions is better, still some pain from yesterday PERTINENT HISTORY: See above  PAIN:  Are you having pain? Yes: NPRS scale: 5/10 Pain location: right shoulder Pain description: dull ache at rest, sharp with motions Aggravating factors: quick motions, movements pain up to 10/10 Relieving factors: ice, rest, Tylenol at best 3/10  PRECAUTIONS: Shoulder Protocol in the chart  RED FLAGS: None   WEIGHT BEARING RESTRICTIONS: No  FALLS:  Has patient fallen in last 6 months? Yes. Number of falls 1  LIVING ENVIRONMENT: Lives with: lives alone Lives in: House/apartment Stairs: No Has following equipment at home:  None  OCCUPATION: retired  PLOF: Independent  PATIENT GOALS:dress without difficulty, do hair, have good ROM and less pain  NEXT MD VISIT:   OBJECTIVE:   DIAGNOSTIC FINDINGS:  See above  PATIENT SURVEYS:  FOTO 21  COGNITION: Overall cognitive status: Within functional limits for tasks assessed     SENSATION: WFL  POSTURE: Fwd head, rounded shoulders, elevated and guarded shoulder  UPPER EXTREMITY ROM:   Active ROM Right PROM eval Right PROM Supine 01/05/23 PROM  01/26/23 PROM 02/09/23 AROM sitting 02/14/23 AROM 02/21/23  Shoulder flexion 35  118  123 90 96  Shoulder extension 5        Shoulder abduction 20  90 92  73 80  Shoulder adduction         Shoulder internal rotation 15        Shoulder external rotation 20  45 30  41   Elbow flexion 120        Elbow extension 5        Wrist flexion         Wrist extension         Wrist ulnar deviation         Wrist radial deviation         Wrist pronation         Wrist supination         (Blank rows = not tested)  UPPER EXTREMITY MMT:  No tested due to recent surgery  MMT Right eval Left eval  Shoulder flexion    Shoulder extension    Shoulder abduction    Shoulder adduction    Shoulder internal rotation    Shoulder external rotation    Middle trapezius    Lower trapezius    Elbow flexion    Elbow extension    Wrist flexion    Wrist extension    Wrist ulnar deviation    Wrist radial deviation    Wrist pronation    Wrist supination    Grip strength (lbs)    (Blank rows = not tested)  PALPATION:  Very tight and tender in the pectoral, upper trap, the entire right upper arm   TODAY'S TREATMENT:  DATE:  02/28/23 Weight 191# UBE level 3 x 5 minutes Seated rows 15# 2x10 Lats 15# 2x10 some assist Chest press 5# 2x10 light assist to start 1# wall slides and circles   Pendulum for pain control with 1# 1# overhead side to side wall slides 1# cabinet reaching flexion, abduction Supine 3# chest press, serratus press, isometric circels 2# ER/IR in supine PROM to the right shoulder STM to the right upper arm Vaso medium pressure 34 degrees  02/23/23 UBE level 3 x 6 mintues Seated rows 15# 2x10 Lats 15# 2x10 Wall slides, circles Red tband ER/IR 1# and 2# cabinet reach to face high shelf Seated 1# ER at 30 degrees abduction AAROM flexion, abduction and ER Vaso right shoulder in sitting low pressure 34 degrees  02/21/23 UBE level 3 x 6 minutes Supine chest press to shoulder flexion 2# x12, 3# x12 SA punches 2# x10, 3# x10 Supine circles 2# then 3# clockwise and counter PROM right shoulder in supine, gentle joint mobs MET into ER pushing into further ranges for IR  Vaso medium pressure 36 degrees  02/16/23 Nustep level 5 x 5 minutes Finger ladder Seated row 15# Lats 15# Red tband ER PROM right shoulder in supine, gentle joint mobs STM to the right upper trap, rhomboid, neck and the right upper arm Vaso medium pressure  35 degrees  02/14/23 Weight 192# UBE x 4 minutes Seated row 15# Lats 15# with assist for pulling Wall slides, circles and side to side at the top Red tband IR Red tband ER Red tband horizontal abduction PROM STM to the right upper trap and arm Vaso medium pressure right shoulder 34 degrees  02/09/23 Weight 189# UBE x 4 minutes Seated row 10# Lats 15#  5# straight arm pulls Red tband ER/IR Wall slides with eccentrics Supine 2# bar chest press Supine 2# bar flexion PROM with some light contract relax Vaso medium pressure  02/08/23 UBE level 4 x 4 minutes 2# wate bar overhead reach with assist 2# wate bar easy extension, very gentle 15# lats 2x7 10# seated row 2x10 5# straight arm pulls Wall slides and wall circles Supine 1# ER/IR  2# and 3# punches, isometric circles and flexion Passive stretch to the  right shoulder, very guarded  02/02/23 UBE level 2 x 6 minutes Wall slides, circles Yellow tband ER/IR 2# wate bar overhead reach 5# row and extension to neutral Supine punches and flexion PROM, AAROM, gentle stretches STM to the right upper trap Vaso medium pressure  01/31/23 Fast walking with cues for arm swing 5# straight arm pulls to pocket Yellow tband ER/IR at neutral Wall slides and circles Supine 1# and 2# isometric circles Supine 1# flexion small ROM Supine serratus pushes 2# Supine 2# ER from belly to neutral PROM of the right shoulder, joint distraction Vaso medium pressure  01/26/23 UBE level 1 x 6 minutes Red tband row and extension Red tband ER/IR Ball rolling up PROM in supine flexion, abduction and ER, gentle joint distraction Supine AAROM punches to ceiling, then flexion AAROM then trying to put hands on top of head and realx STM to the right upper trap, the right upper arm and the neck area Vaso medium pressure 34 degrees  01/24/23 UBE x 6 minutes, cues to decrease compensation Red tband Row and extension Red tband ER and horizontal abduction Doorway stretch, this really hurt AAROM STM to the right upper trap, rhomboid, teres and right upper arm, very tight and sore Vaso medium pressure  PATIENT  EDUCATION: Education details: poc/hep Person educated: Patient Education method: Programmer, multimedia, Facilities manager, Actor cues, Verbal cues, and Handouts Education comprehension: verbalized understanding  HOME EXERCISE PROGRAM: Access Code: [email or web address] URL: [email or web address] Date: 12/20/2022 Prepared by: Stacie Glaze  Exercises - Seated Shoulder Flexion Towel Slide at Table Top  - 2 x daily - 7 x weekly - 2 sets - 10 reps - 3 hold - Seated Shoulder External Rotation PROM on Table  - 2 x daily - 7 x weekly - 2 sets - 10 reps - 3 hold - Seated Elbow Extension and Shoulder External Rotation AAROM at Table with Towel  - 2 x daily - 7 x weekly  - 2 sets - 10 reps - 3 hold - Circular Shoulder Pendulum with Table Support  - 2 x daily - 7 x weekly - 2 sets - 10 reps - 3 hold  ASSESSMENT:  CLINICAL IMPRESSION:  Continue to add to functional strength with small weights and cabinet reaching.  Again very tight in the right upper arm mms with some knots.  After the treatment again she can reach the side  to almost top of her head.  She was reporting more soreness today after lifting grandchild over the weekend  OBJECTIVE IMPAIRMENTS: cardiopulmonary status limiting activity, decreased activity tolerance, decreased endurance, decreased ROM, decreased strength, increased edema, increased muscle spasms, impaired flexibility, impaired UE functional use, improper body mechanics, postural dysfunction, and pain .  REHAB POTENTIAL: Good  CLINICAL DECISION MAKING: Evolving/moderate complexity  EVALUATION COMPLEXITY: Low   GOALS: Goals reviewed with patient? Yes  SHORT TERM GOALS: Target date: 01/01/23  Independent with initial HEP Goal status: 12/27/22 MET  LONG TERM GOALS: Target date: 03/22/23  Decrease pain 50% Goal status: 02/23/23 progressing  2.  Dress without difficulty Goal status: progressing 02/23/23  3.  Do hair without difficulty Goal status: able to reach the back of her head at times 02/23/23  4.  Increase AROM right shoulder flexion to 130 degrees Goal status: 02/23/23 progressing  5.  Increase right shoulder ER to 60 degrees Goal status: progressing 02/14/23  6.  Return to water aerobics and or gym activity Goal status: INITIAL  PLAN:  PT FREQUENCY: 1-2x/week  PT DURATION: 12 weeks  PLANNED INTERVENTIONS: Therapeutic exercises, Therapeutic activity, Neuromuscular re-education, Balance training, Gait training, Patient/Family education, Self Care, Joint mobilization, Dry Needling, Electrical stimulation, Cryotherapy, Vasopneumatic device, and Manual therapy  PLAN FOR NEXT SESSION: starting to add functional  strength   Jearld Lesch, PT 02/28/2023, 7:58 AM [facility] [facility] [address] [facility]. [place], [place], [address] Phone: [phone]   Fax:  [phone]

## 2023-03-02 ENCOUNTER — Encounter: Payer: Self-pay | Admitting: Physical Therapy

## 2023-03-02 ENCOUNTER — Ambulatory Visit: Payer: Medicare Other | Admitting: Physical Therapy

## 2023-03-02 DIAGNOSIS — M542 Cervicalgia: Secondary | ICD-10-CM | POA: Diagnosis not present

## 2023-03-02 DIAGNOSIS — R600 Localized edema: Secondary | ICD-10-CM | POA: Diagnosis not present

## 2023-03-02 DIAGNOSIS — M6281 Muscle weakness (generalized): Secondary | ICD-10-CM

## 2023-03-02 DIAGNOSIS — M25611 Stiffness of right shoulder, not elsewhere classified: Secondary | ICD-10-CM | POA: Diagnosis not present

## 2023-03-02 DIAGNOSIS — M25511 Pain in right shoulder: Secondary | ICD-10-CM | POA: Diagnosis not present

## 2023-03-02 NOTE — Therapy (Signed)
OUTPATIENT PHYSICAL THERAPY SHOULDER    Patient Name: Veronica Galvan MRN: [id number] DOB:05-29-1955, 67 y.o., female Today's Date: 03/02/2023  END OF SESSION:  PT End of Session - 03/02/23 0758     Visit Number 22    Date for PT Re-Evaluation 03/22/23    Authorization Type [employer] [place]    PT Start Time [phone]    PT Stop Time 0900    PT Time Calculation (min) 62 min    Activity Tolerance Patient tolerated treatment well    Behavior During Therapy WFL for tasks assessed/performed             Past Medical History:  Diagnosis Date   Allergy    Anxiety    Arthritis    hands   Cataract    Diabetes mellitus without complication (HCC)    type 2   Family history of adverse reaction to anesthesia    son has malignant hyperthermia, daughter does not daughter recently had c section without problems   GERD (gastroesophageal reflux disease)    Headache    sinus   Hyperlipidemia    Hypertension    PONV (postoperative nausea and vomiting)    nausea only   Ulcer, stomach peptic yrs ago   Past Surgical History:  Procedure Laterality Date   APPENDECTOMY     both hells bone spur repair     both heels with metal clips   both shoulder rotator cuff repair     CESAREAN SECTION     x 1   COLONOSCOPY WITH PROPOFOL N/A 09/07/2016   Procedure: COLONOSCOPY WITH PROPOFOL;  Surgeon: Charolett Bumpers, MD;  Location: [facility];  Service: Endoscopy;  Laterality: N/A;   colonscopy  06/2011   polyps   EYE SURGERY     FRACTURE SURGERY     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/10/2022   Procedure: REVERSE SHOULDER ARTHROPLASTY;  Surgeon: Francena Hanly, MD;  Location: [facility];  Service: Orthopedics;  Laterality: Right;  Please follow in room 6 if able   TUBAL LIGATION     VESICO-VAGINAL FISTULA REPAIR     Patient Active Problem List   Diagnosis Date Noted   Gastroesophageal reflux disease 11/16/2021   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 08/18/2021    Dermatofibroma 08/18/2021   History of malignant neoplasm of skin 08/18/2021   Lentigo 08/18/2021   Melanocytic nevi of trunk 08/18/2021   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   Rosacea 08/18/2021   Actinic keratosis 08/18/2021   Sensorineural hearing loss (SNHL) of left ear with unrestricted hearing of right ear 07/26/2021   Tinnitus of left ear 07/26/2021   Acute recurrent maxillary sinusitis 06/22/2021   Primary hypertension 01/12/2021   Hyperlipidemia associated with type 2 diabetes mellitus (HCC) 01/12/2021   Arthritis 01/12/2021   Type II diabetes mellitus (HCC) 11/25/2019   Ingrown toenail 09/05/2018   Neck pain 01/29/2018   Tick bite 10/24/2017    PCP: Dayton Scrape, [name]  REFERRING PROVIDER: Rennis Chris, MD  REFERRING DIAG: s/p right reverse TSA  THERAPY DIAG:  Acute pain of right shoulder  Stiffness of right shoulder, not elsewhere classified  Localized edema  Cervicalgia  Muscle weakness (generalized)  Rationale for Evaluation and Treatment: Rehabilitation  ONSET DATE: 11/05/22  SUBJECTIVE:  SUBJECTIVE STATEMENT:   A little sore, especially the upper arm See above  PAIN:  Are you having pain? Yes: NPRS scale: 5/10 Pain location: right shoulder Pain description: dull ache at rest, sharp with motions Aggravating factors: quick motions, movements pain up to 10/10 Relieving factors: ice, rest, Tylenol at best 3/10  PRECAUTIONS: Shoulder Protocol in the chart  RED FLAGS: None   WEIGHT BEARING RESTRICTIONS: No  FALLS:  Has patient fallen in last 6 months? Yes. Number of falls 1  LIVING ENVIRONMENT: Lives with: lives alone Lives in: House/apartment Stairs: No Has following equipment at home: None  OCCUPATION: retired  PLOF: Independent  PATIENT GOALS:dress  without difficulty, do hair, have good ROM and less pain  NEXT MD VISIT:   OBJECTIVE:   DIAGNOSTIC FINDINGS:  See above  PATIENT SURVEYS:  FOTO 21  COGNITION: Overall cognitive status: Within functional limits for tasks assessed     SENSATION: WFL  POSTURE: Fwd head, rounded shoulders, elevated and guarded shoulder  UPPER EXTREMITY ROM:   Active ROM Right PROM eval Right PROM Supine 01/05/23 PROM  01/26/23 PROM 02/09/23 AROM sitting 02/14/23 AROM 02/21/23 AROM  03/02/23  Shoulder flexion 35  118  123 90 96 95  Shoulder extension 5         Shoulder abduction 20  90 92  73 80 83  Shoulder adduction          Shoulder internal rotation 15         Shoulder external rotation 20  45 30  41  45  Elbow flexion 120         Elbow extension 5         Wrist flexion          Wrist extension          Wrist ulnar deviation          Wrist radial deviation          Wrist pronation          Wrist supination          (Blank rows = not tested)  UPPER EXTREMITY MMT:  No tested due to recent surgery  MMT Right eval Left eval  Shoulder flexion    Shoulder extension    Shoulder abduction    Shoulder adduction    Shoulder internal rotation    Shoulder external rotation    Middle trapezius    Lower trapezius    Elbow flexion    Elbow extension    Wrist flexion    Wrist extension    Wrist ulnar deviation    Wrist radial deviation    Wrist pronation    Wrist supination    Grip strength (lbs)    (Blank rows = not tested)  PALPATION:  Very tight and tender in the pectoral, upper trap, the entire right upper arm   TODAY'S TREATMENT:  DATE:  03/02/23 UBE level 4 x 4 minutes Seated row 15# 2x10 Lats 15# 2x10 5# chest press small ROM 2x10 5# straight arm pulls 3# eccentric cabinet reaching 2# cabinet reaching Supine 3# punches Supine 2#  ER from belly 2# supine flexion 3# supine isometric circles PROM right shoulder STM to the right upper arm Vaso medium pressure 34 degrees  02/28/23 Weight 191# UBE level 3 x 5 minutes Seated rows 15# 2x10 Lats 15# 2x10 some assist Chest press 5# 2x10 light assist to start 1# wall slides and circles  Pendulum for pain control with 1# 1# overhead side to side wall slides 1# cabinet reaching flexion, abduction Supine 3# chest press, serratus press, isometric circels 2# ER/IR in supine PROM to the right shoulder STM to the right upper arm Vaso medium pressure 34 degrees  02/23/23 UBE level 3 x 6 mintues Seated rows 15# 2x10 Lats 15# 2x10 Wall slides, circles Red tband ER/IR 1# and 2# cabinet reach to face high shelf Seated 1# ER at 30 degrees abduction AAROM flexion, abduction and ER Vaso right shoulder in sitting low pressure 34 degrees  02/21/23 UBE level 3 x 6 minutes Supine chest press to shoulder flexion 2# x12, 3# x12 SA punches 2# x10, 3# x10 Supine circles 2# then 3# clockwise and counter PROM right shoulder in supine, gentle joint mobs MET into ER pushing into further ranges for IR  Vaso medium pressure 36 degrees  02/16/23 Nustep level 5 x 5 minutes Finger ladder Seated row 15# Lats 15# Red tband ER PROM right shoulder in supine, gentle joint mobs STM to the right upper trap, rhomboid, neck and the right upper arm Vaso medium pressure  35 degrees  02/14/23 Weight 192# UBE x 4 minutes Seated row 15# Lats 15# with assist for pulling Wall slides, circles and side to side at the top Red tband IR Red tband ER Red tband horizontal abduction PROM STM to the right upper trap and arm Vaso medium pressure right shoulder 34 degrees  02/09/23 Weight 189# UBE x 4 minutes Seated row 10# Lats 15#  5# straight arm pulls Red tband ER/IR Wall slides with eccentrics Supine 2# bar chest press Supine 2# bar flexion PROM with some light contract  relax Vaso medium pressure  02/08/23 UBE level 4 x 4 minutes 2# wate bar overhead reach with assist 2# wate bar easy extension, very gentle 15# lats 2x7 10# seated row 2x10 5# straight arm pulls Wall slides and wall circles Supine 1# ER/IR  2# and 3# punches, isometric circles and flexion Passive stretch to the right shoulder, very guarded  02/02/23 UBE level 2 x 6 minutes Wall slides, circles Yellow tband ER/IR 2# wate bar overhead reach 5# row and extension to neutral Supine punches and flexion PROM, AAROM, gentle stretches STM to the right upper trap Vaso medium pressure  01/31/23 Fast walking with cues for arm swing 5# straight arm pulls to pocket Yellow tband ER/IR at neutral Wall slides and circles Supine 1# and 2# isometric circles Supine 1# flexion small ROM Supine serratus pushes 2# Supine 2# ER from belly to neutral PROM of the right shoulder, joint distraction Vaso medium pressure   PATIENT EDUCATION: Education details: poc/hep Person educated: Patient Education method: Programmer, multimedia, Facilities manager, Actor cues, Verbal cues, and Handouts Education comprehension: verbalized understanding  HOME EXERCISE PROGRAM: Access Code: [email or web address] URL: [email or web address] Date: 12/20/2022 Prepared by: Stacie Glaze  Exercises - Seated Shoulder Flexion Towel Slide at Table Top  - 2 x  daily - 7 x weekly - 2 sets - 10 reps - 3 hold - Seated Shoulder External Rotation PROM on Table  - 2 x daily - 7 x weekly - 2 sets - 10 reps - 3 hold - Seated Elbow Extension and Shoulder External Rotation AAROM at Table with Towel  - 2 x daily - 7 x weekly - 2 sets - 10 reps - 3 hold - Circular Shoulder Pendulum with Table Support  - 2 x daily - 7 x weekly - 2 sets - 10 reps - 3 hold  ASSESSMENT:  CLINICAL IMPRESSION:  I Continue to add to functional strength with small weights and cabinet reaching, 3# eccentrics today, tends to compensate with upper trap or fast  motions and needs cues to decrease this.  Again very tight in the right upper arm mms with some knots, very tender.  She will see the MD next week  OBJECTIVE IMPAIRMENTS: cardiopulmonary status limiting activity, decreased activity tolerance, decreased endurance, decreased ROM, decreased strength, increased edema, increased muscle spasms, impaired flexibility, impaired UE functional use, improper body mechanics, postural dysfunction, and pain .  REHAB POTENTIAL: Good  CLINICAL DECISION MAKING: Evolving/moderate complexity  EVALUATION COMPLEXITY: Low   GOALS: Goals reviewed with patient? Yes  SHORT TERM GOALS: Target date: 01/01/23  Independent with initial HEP Goal status: 12/27/22 MET  LONG TERM GOALS: Target date: 03/22/23  Decrease pain 50% Goal status: 02/23/23 progressing  2.  Dress without difficulty Goal status: progressing 03/02/23  3.  Do hair without difficulty Goal status: able to reach the back of her head at times 02/23/23  4.  Increase AROM right shoulder flexion to 130 degrees Goal status: 03/02/23 progressing  5.  Increase right shoulder ER to 60 degrees Goal status: progressing 03/02/23  6.  Return to water aerobics and or gym activity Goal status:progressing 03/02/23  PLAN:  PT FREQUENCY: 1-2x/week  PT DURATION: 12 weeks  PLANNED INTERVENTIONS: Therapeutic exercises, Therapeutic activity, Neuromuscular re-education, Balance training, Gait training, Patient/Family education, Self Care, Joint mobilization, Dry Needling, Electrical stimulation, Cryotherapy, Vasopneumatic device, and Manual therapy  PLAN FOR NEXT SESSION:   see what hte MD says as we progress   Jearld Lesch, PT 03/02/2023, 7:58 AM  [facility] [place], [place], [address] Phone: [phone]   Fax:  [phone]

## 2023-03-06 DIAGNOSIS — Z4731 Aftercare following explantation of shoulder joint prosthesis: Secondary | ICD-10-CM | POA: Diagnosis not present

## 2023-03-06 DIAGNOSIS — Z471 Aftercare following joint replacement surgery: Secondary | ICD-10-CM | POA: Diagnosis not present

## 2023-03-06 DIAGNOSIS — Z96611 Presence of right artificial shoulder joint: Secondary | ICD-10-CM | POA: Diagnosis not present

## 2023-03-07 ENCOUNTER — Encounter: Payer: Self-pay | Admitting: Physical Therapy

## 2023-03-07 ENCOUNTER — Ambulatory Visit: Payer: Medicare Other | Attending: Family | Admitting: Physical Therapy

## 2023-03-07 DIAGNOSIS — M6281 Muscle weakness (generalized): Secondary | ICD-10-CM | POA: Insufficient documentation

## 2023-03-07 DIAGNOSIS — M542 Cervicalgia: Secondary | ICD-10-CM | POA: Insufficient documentation

## 2023-03-07 DIAGNOSIS — M25611 Stiffness of right shoulder, not elsewhere classified: Secondary | ICD-10-CM | POA: Insufficient documentation

## 2023-03-07 DIAGNOSIS — R600 Localized edema: Secondary | ICD-10-CM | POA: Insufficient documentation

## 2023-03-07 DIAGNOSIS — M25511 Pain in right shoulder: Secondary | ICD-10-CM | POA: Insufficient documentation

## 2023-03-07 NOTE — Therapy (Signed)
OUTPATIENT PHYSICAL THERAPY SHOULDER    Patient Name: Veronica Galvan MRN: [id number] DOB:[birth year], 67 y.o., female Today's Date: 03/07/2023  END OF SESSION:  PT End of Session - 03/07/23 0802     Visit Number 23    Date for PT Re-Evaluation 03/22/23    Authorization Type [employer] [place]    PT Start Time 0800    PT Stop Time 0903    PT Time Calculation (min) 63 min    Activity Tolerance Patient tolerated treatment well    Behavior During Therapy WFL for tasks assessed/performed             Past Medical History:  Diagnosis Date   Allergy    Anxiety    Arthritis    hands   Cataract    Diabetes mellitus without complication (HCC)    type 2   Family history of adverse reaction to anesthesia    son has malignant hyperthermia, daughter does not daughter recently had c section without problems   GERD (gastroesophageal reflux disease)    Headache    sinus   Hyperlipidemia    Hypertension    PONV (postoperative nausea and vomiting)    nausea only   Ulcer, stomach peptic yrs ago   Past Surgical History:  Procedure Laterality Date   APPENDECTOMY     both hells bone spur repair     both heels with metal clips   both shoulder rotator cuff repair     CESAREAN SECTION     x 1   COLONOSCOPY WITH PROPOFOL N/A 09/07/2016   Procedure: COLONOSCOPY WITH PROPOFOL;  Surgeon: Charolett Bumpers, MD;  Location: [facility];  Service: Endoscopy;  Laterality: N/A;   colonscopy  06/2011   polyps   EYE SURGERY     FRACTURE SURGERY     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/10/2022   Procedure: REVERSE SHOULDER ARTHROPLASTY;  Surgeon: Francena Hanly, MD;  Location: [facility];  Service: Orthopedics;  Laterality: Right;  Please follow in room 6 if able   TUBAL LIGATION     VESICO-VAGINAL FISTULA REPAIR     Patient Active Problem List   Diagnosis Date Noted   Gastroesophageal reflux disease 11/16/2021   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 08/18/2021    Dermatofibroma 08/18/2021   History of malignant neoplasm of skin 08/18/2021   Lentigo 08/18/2021   Melanocytic nevi of trunk 08/18/2021   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   Rosacea 08/18/2021   Actinic keratosis 08/18/2021   Sensorineural hearing loss (SNHL) of left ear with unrestricted hearing of right ear 07/26/2021   Tinnitus of left ear 07/26/2021   Acute recurrent maxillary sinusitis 06/22/2021   Primary hypertension 01/12/2021   Hyperlipidemia associated with type 2 diabetes mellitus (HCC) 01/12/2021   Arthritis 01/12/2021   Type II diabetes mellitus (HCC) 11/25/2019   Ingrown toenail 09/05/2018   Neck pain 01/29/2018   Tick bite 10/24/2017    PCP: Dayton Scrape, [name]  REFERRING PROVIDER: Rennis Chris, MD  REFERRING DIAG: s/p right reverse TSA  THERAPY DIAG:  Acute pain of right shoulder  Stiffness of right shoulder, not elsewhere classified  Localized edema  Cervicalgia  Muscle weakness (generalized)  Rationale for Evaluation and Treatment: Rehabilitation  ONSET DATE: 11/05/22  SUBJECTIVE:  SUBJECTIVE STATEMENT:   Saw MD feels like she is progressing well, feels like she is on track but still needs to go slow  PAIN:  Are you having pain? Yes: NPRS scale: 5/10 Pain location: right shoulder Pain description: dull ache at rest, sharp with motions Aggravating factors: quick motions, movements pain up to 10/10 Relieving factors: ice, rest, Tylenol at best 3/10  PRECAUTIONS: Shoulder Protocol in the chart  RED FLAGS: None   WEIGHT BEARING RESTRICTIONS: No  FALLS:  Has patient fallen in last 6 months? Yes. Number of falls 1  LIVING ENVIRONMENT: Lives with: lives alone Lives in: House/apartment Stairs: No Has following equipment at home:  None  OCCUPATION: retired  PLOF: Independent  PATIENT GOALS:dress without difficulty, do hair, have good ROM and less pain  NEXT MD VISIT:   OBJECTIVE:   DIAGNOSTIC FINDINGS:  See above  PATIENT SURVEYS:  FOTO 21  COGNITION: Overall cognitive status: Within functional limits for tasks assessed     SENSATION: WFL  POSTURE: Fwd head, rounded shoulders, elevated and guarded shoulder  UPPER EXTREMITY ROM:   Active ROM Right PROM eval Right PROM Supine 01/05/23 PROM  01/26/23 PROM 02/09/23 AROM sitting 02/14/23 AROM 02/21/23 AROM  03/02/23  Shoulder flexion 35  118  123 90 96 95  Shoulder extension 5         Shoulder abduction 20  90 92  73 80 83  Shoulder adduction          Shoulder internal rotation 15         Shoulder external rotation 20  45 30  41  45  Elbow flexion 120         Elbow extension 5         Wrist flexion          Wrist extension          Wrist ulnar deviation          Wrist radial deviation          Wrist pronation          Wrist supination          (Blank rows = not tested)  UPPER EXTREMITY MMT:  No tested due to recent surgery  MMT Right eval Left eval  Shoulder flexion    Shoulder extension    Shoulder abduction    Shoulder adduction    Shoulder internal rotation    Shoulder external rotation    Middle trapezius    Lower trapezius    Elbow flexion    Elbow extension    Wrist flexion    Wrist extension    Wrist ulnar deviation    Wrist radial deviation    Wrist pronation    Wrist supination    Grip strength (lbs)    (Blank rows = not tested)  PALPATION:  Very tight and tender in the pectoral, upper trap, the entire right upper arm   TODAY'S TREATMENT:  DATE:  03/07/23 UBE x 5 minutes Pulleys flexion and ER 5# straight arm pulls Fitter push pull and side to side 1 blue band 1# ER elbow on  ball 1# reaching 3# biceps 15# triceps Ball vs wall chest high Passive stretch right shoulder in supine Vaso medium pressure 34 degrees  03/02/23 UBE level 4 x 4 minutes Seated row 15# 2x10 Lats 15# 2x10 5# chest press small ROM 2x10 5# straight arm pulls 3# eccentric cabinet reaching 2# cabinet reaching Supine 3# punches Supine 2# ER from belly 2# supine flexion 3# supine isometric circles PROM right shoulder STM to the right upper arm Vaso medium pressure 34 degrees  02/28/23 Weight 191# UBE level 3 x 5 minutes Seated rows 15# 2x10 Lats 15# 2x10 some assist Chest press 5# 2x10 light assist to start 1# wall slides and circles  Pendulum for pain control with 1# 1# overhead side to side wall slides 1# cabinet reaching flexion, abduction Supine 3# chest press, serratus press, isometric circels 2# ER/IR in supine PROM to the right shoulder STM to the right upper arm Vaso medium pressure 34 degrees  02/23/23 UBE level 3 x 6 mintues Seated rows 15# 2x10 Lats 15# 2x10 Wall slides, circles Red tband ER/IR 1# and 2# cabinet reach to face high shelf Seated 1# ER at 30 degrees abduction AAROM flexion, abduction and ER Vaso right shoulder in sitting low pressure 34 degrees  02/21/23 UBE level 3 x 6 minutes Supine chest press to shoulder flexion 2# x12, 3# x12 SA punches 2# x10, 3# x10 Supine circles 2# then 3# clockwise and counter PROM right shoulder in supine, gentle joint mobs MET into ER pushing into further ranges for IR  Vaso medium pressure 36 degrees  02/16/23 Nustep level 5 x 5 minutes Finger ladder Seated row 15# Lats 15# Red tband ER PROM right shoulder in supine, gentle joint mobs STM to the right upper trap, rhomboid, neck and the right upper arm Vaso medium pressure  35 degrees  02/14/23 Weight 192# UBE x 4 minutes Seated row 15# Lats 15# with assist for pulling Wall slides, circles and side to side at the top Red tband IR Red tband  ER Red tband horizontal abduction PROM STM to the right upper trap and arm Vaso medium pressure right shoulder 34 degrees  02/09/23 Weight 189# UBE x 4 minutes Seated row 10# Lats 15#  5# straight arm pulls Red tband ER/IR Wall slides with eccentrics Supine 2# bar chest press Supine 2# bar flexion PROM with some light contract relax Vaso medium pressure  02/08/23 UBE level 4 x 4 minutes 2# wate bar overhead reach with assist 2# wate bar easy extension, very gentle 15# lats 2x7 10# seated row 2x10 5# straight arm pulls Wall slides and wall circles Supine 1# ER/IR  2# and 3# punches, isometric circles and flexion Passive stretch to the right shoulder, very guarded  02/02/23 UBE level 2 x 6 minutes Wall slides, circles Yellow tband ER/IR 2# wate bar overhead reach 5# row and extension to neutral Supine punches and flexion PROM, AAROM, gentle stretches STM to the right upper trap Vaso medium pressure  01/31/23 Fast walking with cues for arm swing 5# straight arm pulls to pocket Yellow tband ER/IR at neutral Wall slides and circles Supine 1# and 2# isometric circles Supine 1# flexion small ROM Supine serratus pushes 2# Supine 2# ER from belly to neutral PROM of the right shoulder, joint distraction Vaso medium pressure   PATIENT  EDUCATION: Education details: poc/hep Person educated: Patient Education method: Programmer, multimedia, Facilities manager, Actor cues, Verbal cues, and Handouts Education comprehension: verbalized understanding  HOME EXERCISE PROGRAM: Access Code: [email or web address] URL: [email or web address] Date: 12/20/2022 Prepared by: Stacie Glaze  Exercises - Seated Shoulder Flexion Towel Slide at Table Top  - 2 x daily - 7 x weekly - 2 sets - 10 reps - 3 hold - Seated Shoulder External Rotation PROM on Table  - 2 x daily - 7 x weekly - 2 sets - 10 reps - 3 hold - Seated Elbow Extension and Shoulder External Rotation AAROM at Table with Towel  -  2 x daily - 7 x weekly - 2 sets - 10 reps - 3 hold - Circular Shoulder Pendulum with Table Support  - 2 x daily - 7 x weekly - 2 sets - 10 reps - 3 hold  ASSESSMENT:  CLINICAL IMPRESSION: Saw MD, pleased with progressed cautioned her to go slow.  I added some weight bearing activities with the fitter and the ball vs wall, we did the pulleys, she tends to lean and take arm to the side more in scaption and not really in flexion  OBJECTIVE IMPAIRMENTS: cardiopulmonary status limiting activity, decreased activity tolerance, decreased endurance, decreased ROM, decreased strength, increased edema, increased muscle spasms, impaired flexibility, impaired UE functional use, improper body mechanics, postural dysfunction, and pain .  REHAB POTENTIAL: Good  CLINICAL DECISION MAKING: Evolving/moderate complexity  EVALUATION COMPLEXITY: Low   GOALS: Goals reviewed with patient? Yes  SHORT TERM GOALS: Target date: 01/01/23  Independent with initial HEP Goal status: 12/27/22 MET  LONG TERM GOALS: Target date: 03/22/23  Decrease pain 50% Goal status: 02/23/23 progressing  2.  Dress without difficulty Goal status: progressing 03/02/23  3.  Do hair without difficulty Goal status: able to reach the back of her head at times 02/23/23  4.  Increase AROM right shoulder flexion to 130 degrees Goal status: 03/02/23 progressing  5.  Increase right shoulder ER to 60 degrees Goal status: progressing 03/02/23  6.  Return to water aerobics and or gym activity Goal status:progressing 03/02/23  PLAN:  PT FREQUENCY: 1-2x/week  PT DURATION: 12 weeks  PLANNED INTERVENTIONS: Therapeutic exercises, Therapeutic activity, Neuromuscular re-education, Balance training, Gait training, Patient/Family education, Self Care, Joint mobilization, Dry Needling, Electrical stimulation, Cryotherapy, Vasopneumatic device, and Manual therapy  PLAN FOR NEXT SESSION:   Continue to progress with protocol  Jearld Lesch, PT 03/07/2023, 8:04 AM [facility]. [place], [place], [address] Phone: [phone]   Fax:  [phone]

## 2023-03-09 ENCOUNTER — Ambulatory Visit: Payer: Medicare Other | Admitting: Physical Therapy

## 2023-03-09 ENCOUNTER — Encounter: Payer: Self-pay | Admitting: Physical Therapy

## 2023-03-09 DIAGNOSIS — M25611 Stiffness of right shoulder, not elsewhere classified: Secondary | ICD-10-CM

## 2023-03-09 DIAGNOSIS — R600 Localized edema: Secondary | ICD-10-CM | POA: Diagnosis not present

## 2023-03-09 DIAGNOSIS — M6281 Muscle weakness (generalized): Secondary | ICD-10-CM

## 2023-03-09 DIAGNOSIS — M542 Cervicalgia: Secondary | ICD-10-CM

## 2023-03-09 DIAGNOSIS — M25511 Pain in right shoulder: Secondary | ICD-10-CM

## 2023-03-09 NOTE — Therapy (Signed)
OUTPATIENT PHYSICAL THERAPY SHOULDER    Patient Name: Veronica Galvan MRN: [id number] DOB:May 01, 1956, 67 y.o., female Today's Date: 03/09/2023  END OF SESSION:  PT End of Session - 03/09/23 1530     Visit Number 24    Date for PT Re-Evaluation 03/22/23    Authorization Type [employer] [place]    PT Start Time 1528    PT Stop Time 1627    PT Time Calculation (min) 59 min    Activity Tolerance Patient tolerated treatment well    Behavior During Therapy WFL for tasks assessed/performed             Past Medical History:  Diagnosis Date   Allergy    Anxiety    Arthritis    hands   Cataract    Diabetes mellitus without complication (HCC)    type 2   Family history of adverse reaction to anesthesia    son has malignant hyperthermia, daughter does not daughter recently had c section without problems   GERD (gastroesophageal reflux disease)    Headache    sinus   Hyperlipidemia    Hypertension    PONV (postoperative nausea and vomiting)    nausea only   Ulcer, stomach peptic yrs ago   Past Surgical History:  Procedure Laterality Date   APPENDECTOMY     both hells bone spur repair     both heels with metal clips   both shoulder rotator cuff repair     CESAREAN SECTION     x 1   COLONOSCOPY WITH PROPOFOL N/A 09/07/2016   Procedure: COLONOSCOPY WITH PROPOFOL;  Surgeon: Charolett Bumpers, MD;  Location: [facility];  Service: Endoscopy;  Laterality: N/A;   colonscopy  06/2011   polyps   EYE SURGERY     FRACTURE SURGERY     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/10/2022   Procedure: REVERSE SHOULDER ARTHROPLASTY;  Surgeon: Francena Hanly, MD;  Location: [facility];  Service: Orthopedics;  Laterality: Right;  Please follow in room 6 if able   TUBAL LIGATION     VESICO-VAGINAL FISTULA REPAIR     Patient Active Problem List   Diagnosis Date Noted   Gastroesophageal reflux disease 11/16/2021   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 08/18/2021    Dermatofibroma 08/18/2021   History of malignant neoplasm of skin 08/18/2021   Lentigo 08/18/2021   Melanocytic nevi of trunk 08/18/2021   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   Rosacea 08/18/2021   Actinic keratosis 08/18/2021   Sensorineural hearing loss (SNHL) of left ear with unrestricted hearing of right ear 07/26/2021   Tinnitus of left ear 07/26/2021   Acute recurrent maxillary sinusitis 06/22/2021   Primary hypertension 01/12/2021   Hyperlipidemia associated with type 2 diabetes mellitus (HCC) 01/12/2021   Arthritis 01/12/2021   Type II diabetes mellitus (HCC) 11/25/2019   Ingrown toenail 09/05/2018   Neck pain 01/29/2018   Tick bite 10/24/2017    PCP: Dayton Scrape, [name]  REFERRING PROVIDER: Rennis Chris, MD  REFERRING DIAG: s/p right reverse TSA  THERAPY DIAG:  Acute pain of right shoulder  Stiffness of right shoulder, not elsewhere classified  Localized edema  Cervicalgia  Muscle weakness (generalized)  Rationale for Evaluation and Treatment: Rehabilitation  ONSET DATE: 11/05/22  SUBJECTIVE:  SUBJECTIVE STATEMENT:   I was very sore after the MD and then PT I am feeling tight  PAIN:  Are you having pain? Yes: NPRS scale: 5/10 Pain location: right shoulder Pain description: dull ache at rest, sharp with motions Aggravating factors: quick motions, movements pain up to 10/10 Relieving factors: ice, rest, Tylenol at best 3/10  PRECAUTIONS: Shoulder Protocol in the chart  RED FLAGS: None   WEIGHT BEARING RESTRICTIONS: No  FALLS:  Has patient fallen in last 6 months? Yes. Number of falls 1  LIVING ENVIRONMENT: Lives with: lives alone Lives in: House/apartment Stairs: No Has following equipment at home: None  OCCUPATION: retired  PLOF: Independent  PATIENT  GOALS:dress without difficulty, do hair, have good ROM and less pain  NEXT MD VISIT:   OBJECTIVE:   DIAGNOSTIC FINDINGS:  See above  PATIENT SURVEYS:  FOTO 21  COGNITION: Overall cognitive status: Within functional limits for tasks assessed     SENSATION: WFL  POSTURE: Fwd head, rounded shoulders, elevated and guarded shoulder  UPPER EXTREMITY ROM:   Active ROM Right PROM eval Right PROM Supine 01/05/23 PROM  01/26/23 PROM 02/09/23 AROM sitting 02/14/23 AROM 02/21/23 AROM  03/02/23  Shoulder flexion 35  118  123 90 96 95  Shoulder extension 5         Shoulder abduction 20  90 92  73 80 83  Shoulder adduction          Shoulder internal rotation 15         Shoulder external rotation 20  45 30  41  45  Elbow flexion 120         Elbow extension 5         Wrist flexion          Wrist extension          Wrist ulnar deviation          Wrist radial deviation          Wrist pronation          Wrist supination          (Blank rows = not tested)  UPPER EXTREMITY MMT:  No tested due to recent surgery  MMT Right eval Left eval  Shoulder flexion    Shoulder extension    Shoulder abduction    Shoulder adduction    Shoulder internal rotation    Shoulder external rotation    Middle trapezius    Lower trapezius    Elbow flexion    Elbow extension    Wrist flexion    Wrist extension    Wrist ulnar deviation    Wrist radial deviation    Wrist pronation    Wrist supination    Grip strength (lbs)    (Blank rows = not tested)  PALPATION:  Very tight and tender in the pectoral, upper trap, the entire right upper arm   TODAY'S TREATMENT:  DATE:  03/09/23 UBE level 4 x 5 minutes 5# straight arm pulls Wall slides, circles 20# row 20# lats 25# triceps Ball vs wall 2# cabinet reaching Gentle trhowing, underhand and then dart type  small weighted ball Passive stretch Vaso medium pressure  03/07/23 UBE x 5 minutes Pulleys flexion and ER 5# straight arm pulls Fitter push pull and side to side 1 blue band 1# ER elbow on ball 1# reaching 3# biceps 15# triceps Ball vs wall chest high Passive stretch right shoulder in supine Vaso medium pressure 34 degrees  03/02/23 UBE level 4 x 4 minutes Seated row 15# 2x10 Lats 15# 2x10 5# chest press small ROM 2x10 5# straight arm pulls 3# eccentric cabinet reaching 2# cabinet reaching Supine 3# punches Supine 2# ER from belly 2# supine flexion 3# supine isometric circles PROM right shoulder STM to the right upper arm Vaso medium pressure 34 degrees  02/28/23 Weight 191# UBE level 3 x 5 minutes Seated rows 15# 2x10 Lats 15# 2x10 some assist Chest press 5# 2x10 light assist to start 1# wall slides and circles  Pendulum for pain control with 1# 1# overhead side to side wall slides 1# cabinet reaching flexion, abduction Supine 3# chest press, serratus press, isometric circels 2# ER/IR in supine PROM to the right shoulder STM to the right upper arm Vaso medium pressure 34 degrees  02/23/23 UBE level 3 x 6 mintues Seated rows 15# 2x10 Lats 15# 2x10 Wall slides, circles Red tband ER/IR 1# and 2# cabinet reach to face high shelf Seated 1# ER at 30 degrees abduction AAROM flexion, abduction and ER Vaso right shoulder in sitting low pressure 34 degrees  02/21/23 UBE level 3 x 6 minutes Supine chest press to shoulder flexion 2# x12, 3# x12 SA punches 2# x10, 3# x10 Supine circles 2# then 3# clockwise and counter PROM right shoulder in supine, gentle joint mobs MET into ER pushing into further ranges for IR  Vaso medium pressure 36 degrees  02/16/23 Nustep level 5 x 5 minutes Finger ladder Seated row 15# Lats 15# Red tband ER PROM right shoulder in supine, gentle joint mobs STM to the right upper trap, rhomboid, neck and the right upper arm Vaso  medium pressure  35 degrees  02/14/23 Weight 192# UBE x 4 minutes Seated row 15# Lats 15# with assist for pulling Wall slides, circles and side to side at the top Red tband IR Red tband ER Red tband horizontal abduction PROM STM to the right upper trap and arm Vaso medium pressure right shoulder 34 degrees  02/09/23 Weight 189# UBE x 4 minutes Seated row 10# Lats 15#  5# straight arm pulls Red tband ER/IR Wall slides with eccentrics Supine 2# bar chest press Supine 2# bar flexion PROM with some light contract relax Vaso medium pressure  02/08/23 UBE level 4 x 4 minutes 2# wate bar overhead reach with assist 2# wate bar easy extension, very gentle 15# lats 2x7 10# seated row 2x10 5# straight arm pulls Wall slides and wall circles Supine 1# ER/IR  2# and 3# punches, isometric circles and flexion Passive stretch to the right shoulder, very guarded  PATIENT EDUCATION: Education details: poc/hep Person educated: Patient Education method: Programmer, multimedia, Facilities manager, Actor cues, Verbal cues, and Handouts Education comprehension: verbalized understanding  HOME EXERCISE PROGRAM: Access Code: [email or web address] URL: [email or web address] Date: 12/20/2022 Prepared by: Stacie Glaze  Exercises - Seated Shoulder Flexion Towel Slide at Table Top  - 2 x daily - 7 x weekly -  2 sets - 10 reps - 3 hold - Seated Shoulder External Rotation PROM on Table  - 2 x daily - 7 x weekly - 2 sets - 10 reps - 3 hold - Seated Elbow Extension and Shoulder External Rotation AAROM at Table with Towel  - 2 x daily - 7 x weekly - 2 sets - 10 reps - 3 hold - Circular Shoulder Pendulum with Table Support  - 2 x daily - 7 x weekly - 2 sets - 10 reps - 3 hold  ASSESSMENT:  CLINICAL IMPRESSION: Patient sore. She is doing a little more and able to reach her head easier, still really struggling with dressing.  Added a few new exercises with good results but does need cues to go slow and not  do too fast or hard  OBJECTIVE IMPAIRMENTS: cardiopulmonary status limiting activity, decreased activity tolerance, decreased endurance, decreased ROM, decreased strength, increased edema, increased muscle spasms, impaired flexibility, impaired UE functional use, improper body mechanics, postural dysfunction, and pain .  REHAB POTENTIAL: Good  CLINICAL DECISION MAKING: Evolving/moderate complexity  EVALUATION COMPLEXITY: Low   GOALS: Goals reviewed with patient? Yes  SHORT TERM GOALS: Target date: 01/01/23  Independent with initial HEP Goal status: 12/27/22 MET  LONG TERM GOALS: Target date: 03/22/23  Decrease pain 50% Goal status: 02/23/23 progressing  2.  Dress without difficulty Goal status: progressing 03/02/23  3.  Do hair without difficulty Goal status: able to reach the back of her head at times 02/23/23  4.  Increase AROM right shoulder flexion to 130 degrees Goal status: 03/02/23 progressing  5.  Increase right shoulder ER to 60 degrees Goal status: progressing 03/02/23  6.  Return to water aerobics and or gym activity Goal status:progressing 03/09/23  PLAN:  PT FREQUENCY: 1-2x/week  PT DURATION: 12 weeks  PLANNED INTERVENTIONS: Therapeutic exercises, Therapeutic activity, Neuromuscular re-education, Balance training, Gait training, Patient/Family education, Self Care, Joint mobilization, Dry Needling, Electrical stimulation, Cryotherapy, Vasopneumatic device, and Manual therapy  PLAN FOR NEXT SESSION:   Continue to progress with protocol  Jearld Lesch, PT 03/09/2023, 3:31 PM [facility] [address] [facility]. [place], [place], [address] Phone: [phone]   Fax:  [phone]

## 2023-03-11 LAB — MICROALBUMIN / CREATININE URINE RATIO

## 2023-03-14 ENCOUNTER — Other Ambulatory Visit: Payer: Self-pay | Admitting: Family

## 2023-03-14 ENCOUNTER — Ambulatory Visit: Payer: Medicare Other | Admitting: Physical Therapy

## 2023-03-14 ENCOUNTER — Encounter: Payer: Self-pay | Admitting: Physical Therapy

## 2023-03-14 DIAGNOSIS — R600 Localized edema: Secondary | ICD-10-CM

## 2023-03-14 DIAGNOSIS — M25511 Pain in right shoulder: Secondary | ICD-10-CM | POA: Diagnosis not present

## 2023-03-14 DIAGNOSIS — M542 Cervicalgia: Secondary | ICD-10-CM | POA: Diagnosis not present

## 2023-03-14 DIAGNOSIS — M25611 Stiffness of right shoulder, not elsewhere classified: Secondary | ICD-10-CM | POA: Diagnosis not present

## 2023-03-14 DIAGNOSIS — M6281 Muscle weakness (generalized): Secondary | ICD-10-CM | POA: Diagnosis not present

## 2023-03-14 MED ORDER — SEMAGLUTIDE (2 MG/DOSE) 8 MG/3ML ~~LOC~~ SOPN: 2.0000 mg | PEN_INJECTOR | SUBCUTANEOUS | 1 refills | Status: AC

## 2023-03-14 NOTE — Therapy (Signed)
OUTPATIENT PHYSICAL THERAPY SHOULDER    Patient Name: Veronica Galvan MRN: [id number] DOB:1956-02-01, 67 y.o., female Today's Date: 03/14/2023  END OF SESSION:  PT End of Session - 03/14/23 1525     Visit Number 25    Date for PT Re-Evaluation 03/22/23    Authorization Type [employer]    PT Start Time 1440    PT Stop Time 1540    PT Time Calculation (min) 60 min    Activity Tolerance Patient tolerated treatment well    Behavior During Therapy WFL for tasks assessed/performed             Past Medical History:  Diagnosis Date   Allergy    Anxiety    Arthritis    hands   Cataract    Diabetes mellitus without complication (HCC)    type 2   Family history of adverse reaction to anesthesia    son has malignant hyperthermia, daughter does not daughter recently had c section without problems   GERD (gastroesophageal reflux disease)    Headache    sinus   Hyperlipidemia    Hypertension    PONV (postoperative nausea and vomiting)    nausea only   Ulcer, stomach peptic yrs ago   Past Surgical History:  Procedure Laterality Date   APPENDECTOMY     both hells bone spur repair     both heels with metal clips   both shoulder rotator cuff repair     CESAREAN SECTION     x 1   COLONOSCOPY WITH PROPOFOL N/A 09/07/2016   Procedure: COLONOSCOPY WITH PROPOFOL;  Surgeon: Charolett Bumpers, MD;  Location: [facility];  Service: Endoscopy;  Laterality: N/A;   colonscopy  06/2011   polyps   EYE SURGERY     FRACTURE SURGERY     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/10/2022   Procedure: REVERSE SHOULDER ARTHROPLASTY;  Surgeon: Francena Hanly, MD;  Location: [facility];  Service: Orthopedics;  Laterality: Right;  Please follow in room 6 if able   TUBAL LIGATION     VESICO-VAGINAL FISTULA REPAIR     Patient Active Problem List   Diagnosis Date Noted   Gastroesophageal reflux disease 11/16/2021   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 08/18/2021    Dermatofibroma 08/18/2021   History of malignant neoplasm of skin 08/18/2021   Lentigo 08/18/2021   Melanocytic nevi of trunk 08/18/2021   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   Rosacea 08/18/2021   Actinic keratosis 08/18/2021   Sensorineural hearing loss (SNHL) of left ear with unrestricted hearing of right ear 07/26/2021   Tinnitus of left ear 07/26/2021   Acute recurrent maxillary sinusitis 06/22/2021   Primary hypertension 01/12/2021   Hyperlipidemia associated with type 2 diabetes mellitus (HCC) 01/12/2021   Arthritis 01/12/2021   Type II diabetes mellitus (HCC) 11/25/2019   Ingrown toenail 09/05/2018   Neck pain 01/29/2018   Tick bite 10/24/2017    PCP: Dayton Scrape, [name]  REFERRING PROVIDER: Rennis Chris, MD  REFERRING DIAG: s/p right reverse TSA  THERAPY DIAG:  Acute pain of right shoulder  Stiffness of right shoulder, not elsewhere classified  Localized edema  Cervicalgia  Muscle weakness (generalized)  Rationale for Evaluation and Treatment: Rehabilitation  ONSET DATE: 11/05/22  SUBJECTIVE:  SUBJECTIVE STATEMENT:   Reports that her arm has been very sore right upper and posterior arm and then in the neck and upper trap, very tight and sore  PAIN:  Are you having pain? Yes: NPRS scale: 6/10 Pain location: right shoulder Pain description: dull ache at rest, sharp with motions Aggravating factors: quick motions, movements pain up to 10/10 Relieving factors: ice, rest, Tylenol at best 3/10  PRECAUTIONS: Shoulder Protocol in the chart  RED FLAGS: None   WEIGHT BEARING RESTRICTIONS: No  FALLS:  Has patient fallen in last 6 months? Yes. Number of falls 1  LIVING ENVIRONMENT: Lives with: lives alone Lives in: House/apartment Stairs: No Has following equipment at home:  None  OCCUPATION: retired  PLOF: Independent  PATIENT GOALS:dress without difficulty, do hair, have good ROM and less pain  NEXT MD VISIT:   OBJECTIVE:   DIAGNOSTIC FINDINGS:  See above  PATIENT SURVEYS:  FOTO 21  COGNITION: Overall cognitive status: Within functional limits for tasks assessed     SENSATION: WFL  POSTURE: Fwd head, rounded shoulders, elevated and guarded shoulder  UPPER EXTREMITY ROM:   Active ROM Right PROM eval Right PROM Supine 01/05/23 PROM  01/26/23 PROM 02/09/23 AROM sitting 02/14/23 AROM 02/21/23 AROM  03/02/23  Shoulder flexion 35  118  123 90 96 95  Shoulder extension 5         Shoulder abduction 20  90 92  73 80 83  Shoulder adduction          Shoulder internal rotation 15         Shoulder external rotation 20  45 30  41  45  Elbow flexion 120         Elbow extension 5         Wrist flexion          Wrist extension          Wrist ulnar deviation          Wrist radial deviation          Wrist pronation          Wrist supination          (Blank rows = not tested)  UPPER EXTREMITY MMT:  No tested due to recent surgery  MMT Right eval Left eval  Shoulder flexion    Shoulder extension    Shoulder abduction    Shoulder adduction    Shoulder internal rotation    Shoulder external rotation    Middle trapezius    Lower trapezius    Elbow flexion    Elbow extension    Wrist flexion    Wrist extension    Wrist ulnar deviation    Wrist radial deviation    Wrist pronation    Wrist supination    Grip strength (lbs)    (Blank rows = not tested)  PALPATION:  Very tight and tender in the pectoral, upper trap, the entire right upper arm   TODAY'S TREATMENT:  DATE:  03/14/23 UBE level 4 x 6 minutes Red tband row and extension Red tband ER/IR STM to the right shoulder, upper arm, upper trap,  rhomboid and neck PROM of the right shoulder Vaso 34 degrees  03/09/23 UBE level 4 x 5 minutes 5# straight arm pulls Wall slides, circles 20# row 20# lats 25# triceps Ball vs wall 2# cabinet reaching Gentle trhowing, underhand and then dart type small weighted ball Passive stretch Vaso medium pressure  03/07/23 UBE x 5 minutes Pulleys flexion and ER 5# straight arm pulls Fitter push pull and side to side 1 blue band 1# ER elbow on ball 1# reaching 3# biceps 15# triceps Ball vs wall chest high Passive stretch right shoulder in supine Vaso medium pressure 34 degrees  03/02/23 UBE level 4 x 4 minutes Seated row 15# 2x10 Lats 15# 2x10 5# chest press small ROM 2x10 5# straight arm pulls 3# eccentric cabinet reaching 2# cabinet reaching Supine 3# punches Supine 2# ER from belly 2# supine flexion 3# supine isometric circles PROM right shoulder STM to the right upper arm Vaso medium pressure 34 degrees  02/28/23 Weight 191# UBE level 3 x 5 minutes Seated rows 15# 2x10 Lats 15# 2x10 some assist Chest press 5# 2x10 light assist to start 1# wall slides and circles  Pendulum for pain control with 1# 1# overhead side to side wall slides 1# cabinet reaching flexion, abduction Supine 3# chest press, serratus press, isometric circels 2# ER/IR in supine PROM to the right shoulder STM to the right upper arm Vaso medium pressure 34 degrees  02/23/23 UBE level 3 x 6 mintues Seated rows 15# 2x10 Lats 15# 2x10 Wall slides, circles Red tband ER/IR 1# and 2# cabinet reach to face high shelf Seated 1# ER at 30 degrees abduction AAROM flexion, abduction and ER Vaso right shoulder in sitting low pressure 34 degrees  02/21/23 UBE level 3 x 6 minutes Supine chest press to shoulder flexion 2# x12, 3# x12 SA punches 2# x10, 3# x10 Supine circles 2# then 3# clockwise and counter PROM right shoulder in supine, gentle joint mobs MET into ER pushing into further ranges for  IR  Vaso medium pressure 36 degrees  02/16/23 Nustep level 5 x 5 minutes Finger ladder Seated row 15# Lats 15# Red tband ER PROM right shoulder in supine, gentle joint mobs STM to the right upper trap, rhomboid, neck and the right upper arm Vaso medium pressure  35 degrees  02/14/23 Weight 192# UBE x 4 minutes Seated row 15# Lats 15# with assist for pulling Wall slides, circles and side to side at the top Red tband IR Red tband ER Red tband horizontal abduction PROM STM to the right upper trap and arm Vaso medium pressure right shoulder 34 degrees  02/09/23 Weight 189# UBE x 4 minutes Seated row 10# Lats 15#  5# straight arm pulls Red tband ER/IR Wall slides with eccentrics Supine 2# bar chest press Supine 2# bar flexion PROM with some light contract relax Vaso medium pressure  02/08/23 UBE level 4 x 4 minutes 2# wate bar overhead reach with assist 2# wate bar easy extension, very gentle 15# lats 2x7 10# seated row 2x10 5# straight arm pulls Wall slides and wall circles Supine 1# ER/IR  2# and 3# punches, isometric circles and flexion Passive stretch to the right shoulder, very guarded  PATIENT EDUCATION: Education details: poc/hep Person educated: Patient Education method: Programmer, multimedia, Facilities manager, Actor cues, Verbal cues, and Handouts Education comprehension: verbalized understanding  HOME EXERCISE PROGRAM: Access Code: [email or web address] URL: [email or web address] Date: 12/20/2022 Prepared by: Stacie Glaze  Exercises - Seated Shoulder Flexion Towel Slide at Table Top  - 2 x daily - 7 x weekly - 2 sets - 10 reps - 3 hold - Seated Shoulder External Rotation PROM on Table  - 2 x daily - 7 x weekly - 2 sets - 10 reps - 3 hold - Seated Elbow Extension and Shoulder External Rotation AAROM at Table with Towel  - 2 x daily - 7 x weekly - 2 sets - 10 reps - 3 hold - Circular Shoulder Pendulum with Table Support  - 2 x daily - 7 x weekly - 2 sets  - 10 reps - 3 hold  ASSESSMENT:  CLINICAL IMPRESSION: Patient sore. She is very sore in the right upper arm, neck and upper trap, she does seem to have swelling in the right posterior upper arm and it is reddened, but it is not warm to touch, very tight in the neck and upper trap.  I focused on some STM and this seemed to loosen her up pretty well  OBJECTIVE IMPAIRMENTS: cardiopulmonary status limiting activity, decreased activity tolerance, decreased endurance, decreased ROM, decreased strength, increased edema, increased muscle spasms, impaired flexibility, impaired UE functional use, improper body mechanics, postural dysfunction, and pain .  REHAB POTENTIAL: Good  CLINICAL DECISION MAKING: Evolving/moderate complexity  EVALUATION COMPLEXITY: Low   GOALS: Goals reviewed with patient? Yes  SHORT TERM GOALS: Target date: 01/01/23  Independent with initial HEP Goal status: 12/27/22 MET  LONG TERM GOALS: Target date: 03/22/23  Decrease pain 50% Goal status: 02/23/23 progressing  2.  Dress without difficulty Goal status: progressing 03/02/23  3.  Do hair without difficulty Goal status: able to reach the back of her head at times 02/23/23  4.  Increase AROM right shoulder flexion to 130 degrees Goal status: 03/02/23 progressing  5.  Increase right shoulder ER to 60 degrees Goal status: progressing 03/02/23  6.  Return to water aerobics and or gym activity Goal status:progressing 03/09/23  PLAN:  PT FREQUENCY: 1-2x/week  PT DURATION: 12 weeks  PLANNED INTERVENTIONS: Therapeutic exercises, Therapeutic activity, Neuromuscular re-education, Balance training, Gait training, Patient/Family education, Self Care, Joint mobilization, Dry Needling, Electrical stimulation, Cryotherapy, Vasopneumatic device, and Manual therapy  PLAN FOR NEXT SESSION:   Continue to progress with protocol  Jearld Lesch, PT 03/14/2023, 3:25 PM [facility] at  [facility] [address] [facility]. [place], [place], [address] Phone: [phone]   Fax:  [phone]

## 2023-03-16 ENCOUNTER — Encounter: Payer: Self-pay | Admitting: Physical Therapy

## 2023-03-16 ENCOUNTER — Ambulatory Visit: Payer: Medicare Other | Admitting: Physical Therapy

## 2023-03-16 DIAGNOSIS — M25611 Stiffness of right shoulder, not elsewhere classified: Secondary | ICD-10-CM | POA: Diagnosis not present

## 2023-03-16 DIAGNOSIS — R600 Localized edema: Secondary | ICD-10-CM | POA: Diagnosis not present

## 2023-03-16 DIAGNOSIS — M542 Cervicalgia: Secondary | ICD-10-CM | POA: Diagnosis not present

## 2023-03-16 DIAGNOSIS — M25511 Pain in right shoulder: Secondary | ICD-10-CM | POA: Diagnosis not present

## 2023-03-16 DIAGNOSIS — M6281 Muscle weakness (generalized): Secondary | ICD-10-CM | POA: Diagnosis not present

## 2023-03-16 NOTE — Therapy (Signed)
OUTPATIENT PHYSICAL THERAPY SHOULDER    Patient Name: Veronica Galvan MRN: [id number] DOB:1956/02/11, 67 y.o., female Today's Date: 03/16/2023  END OF SESSION:  PT End of Session - 03/16/23 1011     Visit Number 26    Date for PT Re-Evaluation 03/22/23    Authorization Type [employer] [place]    PT Start Time 1011    PT Stop Time 1110    PT Time Calculation (min) 59 min    Activity Tolerance Patient tolerated treatment well    Behavior During Therapy WFL for tasks assessed/performed             Past Medical History:  Diagnosis Date   Allergy    Anxiety    Arthritis    hands   Cataract    Diabetes mellitus without complication (HCC)    type 2   Family history of adverse reaction to anesthesia    son has malignant hyperthermia, daughter does not daughter recently had c section without problems   GERD (gastroesophageal reflux disease)    Headache    sinus   Hyperlipidemia    Hypertension    PONV (postoperative nausea and vomiting)    nausea only   Ulcer, stomach peptic yrs ago   Past Surgical History:  Procedure Laterality Date   APPENDECTOMY     both hells bone spur repair     both heels with metal clips   both shoulder rotator cuff repair     CESAREAN SECTION     x 1   COLONOSCOPY WITH PROPOFOL N/A 09/07/2016   Procedure: COLONOSCOPY WITH PROPOFOL;  Surgeon: Charolett Bumpers, MD;  Location: [facility];  Service: Endoscopy;  Laterality: N/A;   colonscopy  06/2011   polyps   EYE SURGERY     FRACTURE SURGERY     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/10/2022   Procedure: REVERSE SHOULDER ARTHROPLASTY;  Surgeon: Francena Hanly, MD;  Location: [facility];  Service: Orthopedics;  Laterality: Right;  Please follow in room 6 if able   TUBAL LIGATION     VESICO-VAGINAL FISTULA REPAIR     Patient Active Problem List   Diagnosis Date Noted   Gastroesophageal reflux disease 11/16/2021   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 08/18/2021    Dermatofibroma 08/18/2021   History of malignant neoplasm of skin 08/18/2021   Lentigo 08/18/2021   Melanocytic nevi of trunk 08/18/2021   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   Rosacea 08/18/2021   Actinic keratosis 08/18/2021   Sensorineural hearing loss (SNHL) of left ear with unrestricted hearing of right ear 07/26/2021   Tinnitus of left ear 07/26/2021   Acute recurrent maxillary sinusitis 06/22/2021   Primary hypertension 01/12/2021   Hyperlipidemia associated with type 2 diabetes mellitus (HCC) 01/12/2021   Arthritis 01/12/2021   Type II diabetes mellitus (HCC) 11/25/2019   Ingrown toenail 09/05/2018   Neck pain 01/29/2018   Tick bite 10/24/2017    PCP: Dayton Scrape, [name]  REFERRING PROVIDER: Rennis Chris, MD  REFERRING DIAG: s/p right reverse TSA  THERAPY DIAG:  Acute pain of right shoulder  Stiffness of right shoulder, not elsewhere classified  Localized edema  Cervicalgia  Muscle weakness (generalized)  Rationale for Evaluation and Treatment: Rehabilitation  ONSET DATE: 11/05/22  SUBJECTIVE:  SUBJECTIVE STATEMENT:   Reports that the last treatment really helped the arm and the pain and tightness, she did do a class yesterday asn is sore again  PAIN:  Are you having pain? Yes: NPRS scale: 5/10 Pain location: right shoulder Pain description: dull ache at rest, sharp with motions Aggravating factors: quick motions, movements pain up to 10/10 Relieving factors: ice, rest, Tylenol at best 3/10  PRECAUTIONS: Shoulder Protocol in the chart  RED FLAGS: None   WEIGHT BEARING RESTRICTIONS: No  FALLS:  Has patient fallen in last 6 months? Yes. Number of falls 1  LIVING ENVIRONMENT: Lives with: lives alone Lives in: House/apartment Stairs: No Has following equipment at home:  None  OCCUPATION: retired  PLOF: Independent  PATIENT GOALS:dress without difficulty, do hair, have good ROM and less pain  NEXT MD VISIT:   OBJECTIVE:   DIAGNOSTIC FINDINGS:  See above  PATIENT SURVEYS:  FOTO 21  COGNITION: Overall cognitive status: Within functional limits for tasks assessed     SENSATION: WFL  POSTURE: Fwd head, rounded shoulders, elevated and guarded shoulder  UPPER EXTREMITY ROM:   Active ROM Right PROM eval Right PROM Supine 01/05/23 PROM  01/26/23 PROM 02/09/23 AROM sitting 02/14/23 AROM 02/21/23 AROM  03/02/23  Shoulder flexion 35  118  123 90 96 95  Shoulder extension 5         Shoulder abduction 20  90 92  73 80 83  Shoulder adduction          Shoulder internal rotation 15         Shoulder external rotation 20  45 30  41  45  Elbow flexion 120         Elbow extension 5         Wrist flexion          Wrist extension          Wrist ulnar deviation          Wrist radial deviation          Wrist pronation          Wrist supination          (Blank rows = not tested)  UPPER EXTREMITY MMT:  No tested due to recent surgery  MMT Right eval Left eval  Shoulder flexion    Shoulder extension    Shoulder abduction    Shoulder adduction    Shoulder internal rotation    Shoulder external rotation    Middle trapezius    Lower trapezius    Elbow flexion    Elbow extension    Wrist flexion    Wrist extension    Wrist ulnar deviation    Wrist radial deviation    Wrist pronation    Wrist supination    Grip strength (lbs)    (Blank rows = not tested)  PALPATION:  Very tight and tender in the pectoral, upper trap, the entire right upper arm   TODAY'S TREATMENT:  DATE:  03/16/23 UBE level 4 x 6 minutes 5# straight arm pulls 15# lats Wall slides flexion, circles and overhead side to side Ball vs  wall Supine 2# chest press Supine 3# serratus Supine 3# isometric circles Supine 2# ER/IR to belly STM tot he right upper trap, neck and upper arm PROM of the right shoulder within protocol limits Vaso low pressure for swelling of the shoulder and upper arm  03/14/23 UBE level 4 x 6 minutes Red tband row and extension Red tband ER/IR STM to the right shoulder, upper arm, upper trap, rhomboid and neck PROM of the right shoulder Vaso 34 degrees  03/09/23 UBE level 4 x 5 minutes 5# straight arm pulls Wall slides, circles 20# row 20# lats 25# triceps Ball vs wall 2# cabinet reaching Gentle trhowing, underhand and then dart type small weighted ball Passive stretch Vaso medium pressure  03/07/23 UBE x 5 minutes Pulleys flexion and ER 5# straight arm pulls Fitter push pull and side to side 1 blue band 1# ER elbow on ball 1# reaching 3# biceps 15# triceps Ball vs wall chest high Passive stretch right shoulder in supine Vaso medium pressure 34 degrees  03/02/23 UBE level 4 x 4 minutes Seated row 15# 2x10 Lats 15# 2x10 5# chest press small ROM 2x10 5# straight arm pulls 3# eccentric cabinet reaching 2# cabinet reaching Supine 3# punches Supine 2# ER from belly 2# supine flexion 3# supine isometric circles PROM right shoulder STM to the right upper arm Vaso medium pressure 34 degrees  02/28/23 Weight 191# UBE level 3 x 5 minutes Seated rows 15# 2x10 Lats 15# 2x10 some assist Chest press 5# 2x10 light assist to start 1# wall slides and circles  Pendulum for pain control with 1# 1# overhead side to side wall slides 1# cabinet reaching flexion, abduction Supine 3# chest press, serratus press, isometric circels 2# ER/IR in supine PROM to the right shoulder STM to the right upper arm Vaso medium pressure 34 degrees  02/23/23 UBE level 3 x 6 mintues Seated rows 15# 2x10 Lats 15# 2x10 Wall slides, circles Red tband ER/IR 1# and 2# cabinet reach to face  high shelf Seated 1# ER at 30 degrees abduction AAROM flexion, abduction and ER Vaso right shoulder in sitting low pressure 34 degrees  02/21/23 UBE level 3 x 6 minutes Supine chest press to shoulder flexion 2# x12, 3# x12 SA punches 2# x10, 3# x10 Supine circles 2# then 3# clockwise and counter PROM right shoulder in supine, gentle joint mobs MET into ER pushing into further ranges for IR  Vaso medium pressure 36 degrees  02/16/23 Nustep level 5 x 5 minutes Finger ladder Seated row 15# Lats 15# Red tband ER PROM right shoulder in supine, gentle joint mobs STM to the right upper trap, rhomboid, neck and the right upper arm Vaso medium pressure  35 degrees  02/14/23 Weight 192# UBE x 4 minutes Seated row 15# Lats 15# with assist for pulling Wall slides, circles and side to side at the top Red tband IR Red tband ER Red tband horizontal abduction PROM STM to the right upper trap and arm Vaso medium pressure right shoulder 34 degrees PATIENT EDUCATION: Education details: poc/hep Person educated: Patient Education method: Programmer, multimedia, Facilities manager, Actor cues, Verbal cues, and Handouts Education comprehension: verbalized understanding  HOME EXERCISE PROGRAM: Access Code: [email or web address] URL: [email or web address] Date: 12/20/2022 Prepared by: Stacie Glaze  Exercises - Seated Shoulder Flexion Towel Slide at Table Top  - 2 x daily -  7 x weekly - 2 sets - 10 reps - 3 hold - Seated Shoulder External Rotation PROM on Table  - 2 x daily - 7 x weekly - 2 sets - 10 reps - 3 hold - Seated Elbow Extension and Shoulder External Rotation AAROM at Table with Towel  - 2 x daily - 7 x weekly - 2 sets - 10 reps - 3 hold - Circular Shoulder Pendulum with Table Support  - 2 x daily - 7 x weekly - 2 sets - 10 reps - 3 hold  ASSESSMENT:  CLINICAL IMPRESSION: Patient reported that the last treatment really helped her soreness and pain and she could move much better after,  she reports that she did a class yesterday and is hurting again, we continued the strength and motion and she is getting much better with the wall slides and ability to touch her face and hair, still tight and stiff, but improving, lately the biggest issue has been spasms and knots in the right upper arm.  OBJECTIVE IMPAIRMENTS: cardiopulmonary status limiting activity, decreased activity tolerance, decreased endurance, decreased ROM, decreased strength, increased edema, increased muscle spasms, impaired flexibility, impaired UE functional use, improper body mechanics, postural dysfunction, and pain .  REHAB POTENTIAL: Good  CLINICAL DECISION MAKING: Evolving/moderate complexity  EVALUATION COMPLEXITY: Low   GOALS: Goals reviewed with patient? Yes  SHORT TERM GOALS: Target date: 01/01/23  Independent with initial HEP Goal status: 12/27/22 MET  LONG TERM GOALS: Target date: 03/22/23  Decrease pain 50% Goal status: 02/23/23 progressing  2.  Dress without difficulty Goal status: progressing 03/02/23  3.  Do hair without difficulty Goal status: able to reach the back of her head at times 03/16/23  4.  Increase AROM right shoulder flexion to 130 degrees Goal status: 03/02/23 progressing  5.  Increase right shoulder ER to 60 degrees Goal status: progressing 03/02/23  6.  Return to water aerobics and or gym activity Goal status:progressing 03/09/23  PLAN:  PT FREQUENCY: 1-2x/week  PT DURATION: 12 weeks  PLANNED INTERVENTIONS: Therapeutic exercises, Therapeutic activity, Neuromuscular re-education, Balance training, Gait training, Patient/Family education, Self Care, Joint mobilization, Dry Needling, Electrical stimulation, Cryotherapy, Vasopneumatic device, and Manual therapy  PLAN FOR NEXT SESSION:   Continue to progress with protocol  Jearld Lesch, PT 03/16/2023, 10:12 AM [facility]. [place], [place], [address] Phone: [phone]   Fax:  [phone]

## 2023-03-17 ENCOUNTER — Other Ambulatory Visit: Payer: Self-pay | Admitting: Family

## 2023-03-20 ENCOUNTER — Ambulatory Visit: Payer: Medicare Other | Admitting: Physical Therapy

## 2023-03-20 ENCOUNTER — Encounter: Payer: Self-pay | Admitting: Physical Therapy

## 2023-03-20 DIAGNOSIS — M25611 Stiffness of right shoulder, not elsewhere classified: Secondary | ICD-10-CM | POA: Diagnosis not present

## 2023-03-20 DIAGNOSIS — M542 Cervicalgia: Secondary | ICD-10-CM | POA: Diagnosis not present

## 2023-03-20 DIAGNOSIS — M25511 Pain in right shoulder: Secondary | ICD-10-CM | POA: Diagnosis not present

## 2023-03-20 DIAGNOSIS — M6281 Muscle weakness (generalized): Secondary | ICD-10-CM | POA: Diagnosis not present

## 2023-03-20 DIAGNOSIS — R600 Localized edema: Secondary | ICD-10-CM

## 2023-03-20 NOTE — Therapy (Signed)
OUTPATIENT PHYSICAL THERAPY SHOULDER    Patient Name: Veronica Galvan MRN: [id number] DOB:June 12, 1955, 67 y.o., female Today's Date: 03/20/2023  END OF SESSION:  PT End of Session - 03/20/23 1410     Visit Number 27    Date for PT Re-Evaluation 03/22/23    Authorization Type [employer]    PT Start Time 1355    PT Stop Time 1452    PT Time Calculation (min) 57 min    Activity Tolerance Patient tolerated treatment well    Behavior During Therapy WFL for tasks assessed/performed             Past Medical History:  Diagnosis Date   Allergy    Anxiety    Arthritis    hands   Cataract    Diabetes mellitus without complication (HCC)    type 2   Family history of adverse reaction to anesthesia    son has malignant hyperthermia, daughter does not daughter recently had c section without problems   GERD (gastroesophageal reflux disease)    Headache    sinus   Hyperlipidemia    Hypertension    PONV (postoperative nausea and vomiting)    nausea only   Ulcer, stomach peptic yrs ago   Past Surgical History:  Procedure Laterality Date   APPENDECTOMY     both hells bone spur repair     both heels with metal clips   both shoulder rotator cuff repair     CESAREAN SECTION     x 1   COLONOSCOPY WITH PROPOFOL N/A 09/07/2016   Procedure: COLONOSCOPY WITH PROPOFOL;  Surgeon: Charolett Bumpers, MD;  Location: [facility];  Service: Endoscopy;  Laterality: N/A;   colonscopy  06/2011   polyps   EYE SURGERY     FRACTURE SURGERY     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/10/2022   Procedure: REVERSE SHOULDER ARTHROPLASTY;  Surgeon: Francena Hanly, MD;  Location: [facility];  Service: Orthopedics;  Laterality: Right;  Please follow in room 6 if able   TUBAL LIGATION     VESICO-VAGINAL FISTULA REPAIR     Patient Active Problem List   Diagnosis Date Noted   Gastroesophageal reflux disease 11/16/2021   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 08/18/2021    Dermatofibroma 08/18/2021   History of malignant neoplasm of skin 08/18/2021   Lentigo 08/18/2021   Melanocytic nevi of trunk 08/18/2021   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   Rosacea 08/18/2021   Actinic keratosis 08/18/2021   Sensorineural hearing loss (SNHL) of left ear with unrestricted hearing of right ear 07/26/2021   Tinnitus of left ear 07/26/2021   Acute recurrent maxillary sinusitis 06/22/2021   Primary hypertension 01/12/2021   Hyperlipidemia associated with type 2 diabetes mellitus (HCC) 01/12/2021   Arthritis 01/12/2021   Type II diabetes mellitus (HCC) 11/25/2019   Ingrown toenail 09/05/2018   Neck pain 01/29/2018   Tick bite 10/24/2017    PCP: Dayton Scrape, [name]  REFERRING PROVIDER: Rennis Chris, MD  REFERRING DIAG: s/p right reverse TSA  THERAPY DIAG:  Acute pain of right shoulder  Stiffness of right shoulder, not elsewhere classified  Localized edema  Cervicalgia  Rationale for Evaluation and Treatment: Rehabilitation  ONSET DATE: 11/05/22  SUBJECTIVE:  SUBJECTIVE STATEMENT:   Reports that she has been very active, having some left shoulder pain from it doing more work  PAIN:  Are you having pain? Yes: NPRS scale: 5/10 Pain location: right shoulder Pain description: dull ache at rest, sharp with motions Aggravating factors: quick motions, movements pain up to 10/10 Relieving factors: ice, rest, Tylenol at best 3/10  PRECAUTIONS: Shoulder Protocol in the chart  RED FLAGS: None   WEIGHT BEARING RESTRICTIONS: No  FALLS:  Has patient fallen in last 6 months? Yes. Number of falls 1  LIVING ENVIRONMENT: Lives with: lives alone Lives in: House/apartment Stairs: No Has following equipment at home: None  OCCUPATION: retired  PLOF: Independent  PATIENT  GOALS:dress without difficulty, do hair, have good ROM and less pain  NEXT MD VISIT:   OBJECTIVE:   DIAGNOSTIC FINDINGS:  See above  PATIENT SURVEYS:  FOTO 21  COGNITION: Overall cognitive status: Within functional limits for tasks assessed     SENSATION: WFL  POSTURE: Fwd head, rounded shoulders, elevated and guarded shoulder  UPPER EXTREMITY ROM:   Active ROM Right PROM eval Right PROM Supine 01/05/23 PROM  01/26/23 PROM 02/09/23 AROM sitting 02/14/23 AROM 02/21/23 AROM  03/02/23  Shoulder flexion 35  118  123 90 96 95  Shoulder extension 5         Shoulder abduction 20  90 92  73 80 83  Shoulder adduction          Shoulder internal rotation 15         Shoulder external rotation 20  45 30  41  45  Elbow flexion 120         Elbow extension 5         Wrist flexion          Wrist extension          Wrist ulnar deviation          Wrist radial deviation          Wrist pronation          Wrist supination          (Blank rows = not tested)  UPPER EXTREMITY MMT:  No tested due to recent surgery  MMT Right eval Left eval  Shoulder flexion    Shoulder extension    Shoulder abduction    Shoulder adduction    Shoulder internal rotation    Shoulder external rotation    Middle trapezius    Lower trapezius    Elbow flexion    Elbow extension    Wrist flexion    Wrist extension    Wrist ulnar deviation    Wrist radial deviation    Wrist pronation    Wrist supination    Grip strength (lbs)    (Blank rows = not tested)  PALPATION:  Very tight and tender in the pectoral, upper trap, the entire right upper arm   TODAY'S TREATMENT:  DATE:  03/20/23 UBE level 4 x 5 minutes 20# rows 2x10 20# lats x 10, then 15# x 10 5# chest press 2x10 Rhythmic stabilization trying overhead and using only shoulder mms, this was difficult for  her Ball vs wall approximation small circles Wall slides, flexion, circles Cabinet reaching 1# and 2# from sink to first shelf and from first to second Supine 2# flexion 60-100 degrees Star gazer stretch Supine 3# serratus, and isometric circles 2# supine ER from belly to 20 degrees Passive stretch right shoulder Vaso low pressure 34 degrees right shoulder  03/16/23 UBE level 4 x 6 minutes 5# straight arm pulls 15# lats Wall slides flexion, circles and overhead side to side Ball vs wall Supine 2# chest press Supine 3# serratus Supine 3# isometric circles Supine 2# ER/IR to belly STM tot he right upper trap, neck and upper arm PROM of the right shoulder within protocol limits Vaso low pressure for swelling of the shoulder and upper arm  03/14/23 UBE level 4 x 6 minutes Red tband row and extension Red tband ER/IR STM to the right shoulder, upper arm, upper trap, rhomboid and neck PROM of the right shoulder Vaso 34 degrees  03/09/23 UBE level 4 x 5 minutes 5# straight arm pulls Wall slides, circles 20# row 20# lats 25# triceps Ball vs wall 2# cabinet reaching Gentle trhowing, underhand and then dart type small weighted ball Passive stretch Vaso medium pressure  03/07/23 UBE x 5 minutes Pulleys flexion and ER 5# straight arm pulls Fitter push pull and side to side 1 blue band 1# ER elbow on ball 1# reaching 3# biceps 15# triceps Ball vs wall chest high Passive stretch right shoulder in supine Vaso medium pressure 34 degrees  03/02/23 UBE level 4 x 4 minutes Seated row 15# 2x10 Lats 15# 2x10 5# chest press small ROM 2x10 5# straight arm pulls 3# eccentric cabinet reaching 2# cabinet reaching Supine 3# punches Supine 2# ER from belly 2# supine flexion 3# supine isometric circles PROM right shoulder STM to the right upper arm Vaso medium pressure 34 degrees  02/28/23 Weight 191# UBE level 3 x 5 minutes Seated rows 15# 2x10 Lats 15# 2x10 some  assist Chest press 5# 2x10 light assist to start 1# wall slides and circles  Pendulum for pain control with 1# 1# overhead side to side wall slides 1# cabinet reaching flexion, abduction Supine 3# chest press, serratus press, isometric circels 2# ER/IR in supine PROM to the right shoulder STM to the right upper arm Vaso medium pressure 34 degrees  02/23/23 UBE level 3 x 6 mintues Seated rows 15# 2x10 Lats 15# 2x10 Wall slides, circles Red tband ER/IR 1# and 2# cabinet reach to face high shelf Seated 1# ER at 30 degrees abduction AAROM flexion, abduction and ER Vaso right shoulder in sitting low pressure 34 degrees  02/21/23 UBE level 3 x 6 minutes Supine chest press to shoulder flexion 2# x12, 3# x12 SA punches 2# x10, 3# x10 Supine circles 2# then 3# clockwise and counter PROM right shoulder in supine, gentle joint mobs MET into ER pushing into further ranges for IR  Vaso medium pressure 36 degrees  02/16/23 Nustep level 5 x 5 minutes Finger ladder Seated row 15# Lats 15# Red tband ER PROM right shoulder in supine, gentle joint mobs STM to the right upper trap, rhomboid, neck and the right upper arm Vaso medium pressure  35 degrees  02/14/23 Weight 192# UBE x 4 minutes Seated row 15#  Lats 15# with assist for pulling Wall slides, circles and side to side at the top Red tband IR Red tband ER Red tband horizontal abduction PROM STM to the right upper trap and arm Vaso medium pressure right shoulder 34 degrees PATIENT EDUCATION: Education details: poc/hep Person educated: Patient Education method: Programmer, multimedia, Facilities manager, Actor cues, Verbal cues, and Handouts Education comprehension: verbalized understanding  HOME EXERCISE PROGRAM: Access Code: [email or web address] URL: [email or web address] Date: 12/20/2022 Prepared by: Stacie Glaze  Exercises - Seated Shoulder Flexion Towel Slide at Table Top  - 2 x daily - 7 x weekly - 2 sets - 10 reps - 3  hold - Seated Shoulder External Rotation PROM on Table  - 2 x daily - 7 x weekly - 2 sets - 10 reps - 3 hold - Seated Elbow Extension and Shoulder External Rotation AAROM at Table with Towel  - 2 x daily - 7 x weekly - 2 sets - 10 reps - 3 hold - Circular Shoulder Pendulum with Table Support  - 2 x daily - 7 x weekly - 2 sets - 10 reps - 3 hold  ASSESSMENT:  CLINICAL IMPRESSION: Patient has remained very busy and feels that she is over doing it and now having some left shoulder pain, she has a lot of cooking that she will be doing this week and next.  Lately the biggest issue has been spasms and knots in the right upper arm.  I really tried to get her not to compensate today with her motions.  OBJECTIVE IMPAIRMENTS: cardiopulmonary status limiting activity, decreased activity tolerance, decreased endurance, decreased ROM, decreased strength, increased edema, increased muscle spasms, impaired flexibility, impaired UE functional use, improper body mechanics, postural dysfunction, and pain .  REHAB POTENTIAL: Good  CLINICAL DECISION MAKING: Evolving/moderate complexity  EVALUATION COMPLEXITY: Low   GOALS: Goals reviewed with patient? Yes  SHORT TERM GOALS: Target date: 01/01/23  Independent with initial HEP Goal status: 12/27/22 MET  LONG TERM GOALS: Target date: 03/22/23  Decrease pain 50% Goal status: 02/23/23 progressing  2.  Dress without difficulty Goal status: progressing 03/02/23  3.  Do hair without difficulty Goal status: able to reach the back of her head at times 03/16/23  4.  Increase AROM right shoulder flexion to 130 degrees Goal status: 03/02/23 progressing  5.  Increase right shoulder ER to 60 degrees Goal status: progressing 03/02/23  6.  Return to water aerobics and or gym activity Goal status:progressing 03/09/23  PLAN:  PT FREQUENCY: 1-2x/week  PT DURATION: 12 weeks  PLANNED INTERVENTIONS: Therapeutic exercises, Therapeutic activity, Neuromuscular  re-education, Balance training, Gait training, Patient/Family education, Self Care, Joint mobilization, Dry Needling, Electrical stimulation, Cryotherapy, Vasopneumatic device, and Manual therapy  PLAN FOR NEXT SESSION:   Continue to progress with protocol, assess and do renewal  Jearld Lesch, PT 03/20/2023, 2:11 PM [facility] at [facility]. [place], [place], [address] Phone: [phone]   Fax:  [phone]

## 2023-03-21 ENCOUNTER — Encounter: Payer: Self-pay | Admitting: Family

## 2023-03-22 ENCOUNTER — Encounter: Payer: Self-pay | Admitting: Physical Therapy

## 2023-03-22 ENCOUNTER — Ambulatory Visit: Payer: Medicare Other | Admitting: Physical Therapy

## 2023-03-22 DIAGNOSIS — M25611 Stiffness of right shoulder, not elsewhere classified: Secondary | ICD-10-CM | POA: Diagnosis not present

## 2023-03-22 DIAGNOSIS — M25511 Pain in right shoulder: Secondary | ICD-10-CM | POA: Diagnosis not present

## 2023-03-22 DIAGNOSIS — M6281 Muscle weakness (generalized): Secondary | ICD-10-CM | POA: Diagnosis not present

## 2023-03-22 DIAGNOSIS — M542 Cervicalgia: Secondary | ICD-10-CM | POA: Diagnosis not present

## 2023-03-22 DIAGNOSIS — R600 Localized edema: Secondary | ICD-10-CM

## 2023-03-22 NOTE — Therapy (Signed)
OUTPATIENT PHYSICAL THERAPY SHOULDER    Patient Name: Veronica Galvan MRN: [id number] DOB:04-24-1956, 67 y.o., female Today's Date: 03/22/2023  END OF SESSION:  PT End of Session - 03/22/23 0932     Visit Number 28    Date for PT Re-Evaluation 04/26/23    Authorization Type [employer] [place]    PT Start Time [phone]    PT Stop Time 1029    PT Time Calculation (min) 61 min    Activity Tolerance Patient tolerated treatment well    Behavior During Therapy WFL for tasks assessed/performed             Past Medical History:  Diagnosis Date   Allergy    Anxiety    Arthritis    hands   Cataract    Diabetes mellitus without complication (HCC)    type 2   Family history of adverse reaction to anesthesia    son has malignant hyperthermia, daughter does not daughter recently had c section without problems   GERD (gastroesophageal reflux disease)    Headache    sinus   Hyperlipidemia    Hypertension    PONV (postoperative nausea and vomiting)    nausea only   Ulcer, stomach peptic yrs ago   Past Surgical History:  Procedure Laterality Date   APPENDECTOMY     both hells bone spur repair     both heels with metal clips   both shoulder rotator cuff repair     CESAREAN SECTION     x 1   COLONOSCOPY WITH PROPOFOL N/A 09/07/2016   Procedure: COLONOSCOPY WITH PROPOFOL;  Surgeon: Charolett Bumpers, MD;  Location: [facility];  Service: Endoscopy;  Laterality: N/A;   colonscopy  06/2011   polyps   EYE SURGERY     FRACTURE SURGERY     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/10/2022   Procedure: REVERSE SHOULDER ARTHROPLASTY;  Surgeon: Francena Hanly, MD;  Location: [facility];  Service: Orthopedics;  Laterality: Right;  Please follow in room 6 if able   TUBAL LIGATION     VESICO-VAGINAL FISTULA REPAIR     Patient Active Problem List   Diagnosis Date Noted   Gastroesophageal reflux disease 11/16/2021   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 08/18/2021    Dermatofibroma 08/18/2021   History of malignant neoplasm of skin 08/18/2021   Lentigo 08/18/2021   Melanocytic nevi of trunk 08/18/2021   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   Rosacea 08/18/2021   Actinic keratosis 08/18/2021   Sensorineural hearing loss (SNHL) of left ear with unrestricted hearing of right ear 07/26/2021   Tinnitus of left ear 07/26/2021   Acute recurrent maxillary sinusitis 06/22/2021   Primary hypertension 01/12/2021   Hyperlipidemia associated with type 2 diabetes mellitus (HCC) 01/12/2021   Arthritis 01/12/2021   Type II diabetes mellitus (HCC) 11/25/2019   Ingrown toenail 09/05/2018   Neck pain 01/29/2018   Tick bite 10/24/2017    PCP: Dayton Scrape, [name]  REFERRING PROVIDER: Rennis Chris, MD  REFERRING DIAG: s/p right reverse TSA  THERAPY DIAG:  Acute pain of right shoulder  Stiffness of right shoulder, not elsewhere classified  Localized edema  Cervicalgia  Muscle weakness (generalized)  Rationale for Evaluation and Treatment: Rehabilitation  ONSET DATE: 11/05/22  SUBJECTIVE:  SUBJECTIVE STATEMENT:  I did a lot of cooking yesterday and am very sore and stiff PAIN:  Are you having pain? Yes: NPRS scale: 5/10 Pain location: right shoulder Pain description: dull ache at rest, sharp with motions Aggravating factors: quick motions, movements pain up to 10/10 Relieving factors: ice, rest, Tylenol at best 3/10  PRECAUTIONS: Shoulder Protocol in the chart  RED FLAGS: None   WEIGHT BEARING RESTRICTIONS: No  FALLS:  Has patient fallen in last 6 months? Yes. Number of falls 1  LIVING ENVIRONMENT: Lives with: lives alone Lives in: House/apartment Stairs: No Has following equipment at home: None  OCCUPATION: retired  PLOF: Independent  PATIENT GOALS:dress  without difficulty, do hair, have good ROM and less pain  NEXT MD VISIT:   OBJECTIVE:   DIAGNOSTIC FINDINGS:  See above  PATIENT SURVEYS:  FOTO 21  COGNITION: Overall cognitive status: Within functional limits for tasks assessed     SENSATION: WFL  POSTURE: Fwd head, rounded shoulders, elevated and guarded shoulder  UPPER EXTREMITY ROM:   Active ROM Right PROM eval Right PROM Supine 01/05/23 PROM  01/26/23 PROM 02/09/23 AROM sitting 02/14/23 AROM 02/21/23 AROM  03/02/23 AROM  03/22/23  Shoulder flexion 35  118  123 90 96 95 100  Shoulder extension 5          Shoulder abduction 20  90 92  73 80 83 90  Shoulder adduction           Shoulder internal rotation 15          Shoulder external rotation 20  45 30  41  45 50  Elbow flexion 120          Elbow extension 5          Wrist flexion           Wrist extension           Wrist ulnar deviation           Wrist radial deviation           Wrist pronation           Wrist supination           (Blank rows = not tested)  UPPER EXTREMITY MMT:  No tested due to recent surgery  MMT Right eval Left eval  Shoulder flexion    Shoulder extension    Shoulder abduction    Shoulder adduction    Shoulder internal rotation    Shoulder external rotation    Middle trapezius    Lower trapezius    Elbow flexion    Elbow extension    Wrist flexion    Wrist extension    Wrist ulnar deviation    Wrist radial deviation    Wrist pronation    Wrist supination    Grip strength (lbs)    (Blank rows = not tested)  PALPATION:  Very tight and tender in the pectoral, upper trap, the entire right upper arm   TODAY'S TREATMENT:  DATE:  03/22/23 UBE level 4 x 3 minutes Wall slides and wall circles Supine PROM no IR, light joint distraction Supine AROM punches, flexion, ER/IR serratus STM to the  neck, upper trap and rhomboid and the right upper arm Vaso low pressure 34 degrees  03/20/23 UBE level 4 x 5 minutes 20# rows 2x10 20# lats x 10, then 15# x 10 5# chest press 2x10 Rhythmic stabilization trying overhead and using only shoulder mms, this was difficult for her Ball vs wall approximation small circles Wall slides, flexion, circles Cabinet reaching 1# and 2# from sink to first shelf and from first to second Supine 2# flexion 60-100 degrees Star gazer stretch Supine 3# serratus, and isometric circles 2# supine ER from belly to 20 degrees Passive stretch right shoulder Vaso low pressure 34 degrees right shoulder  03/16/23 UBE level 4 x 6 minutes 5# straight arm pulls 15# lats Wall slides flexion, circles and overhead side to side Ball vs wall Supine 2# chest press Supine 3# serratus Supine 3# isometric circles Supine 2# ER/IR to belly STM tot he right upper trap, neck and upper arm PROM of the right shoulder within protocol limits Vaso low pressure for swelling of the shoulder and upper arm  03/14/23 UBE level 4 x 6 minutes Red tband row and extension Red tband ER/IR STM to the right shoulder, upper arm, upper trap, rhomboid and neck PROM of the right shoulder Vaso 34 degrees  03/09/23 UBE level 4 x 5 minutes 5# straight arm pulls Wall slides, circles 20# row 20# lats 25# triceps Ball vs wall 2# cabinet reaching Gentle trhowing, underhand and then dart type small weighted ball Passive stretch Vaso medium pressure  03/07/23 UBE x 5 minutes Pulleys flexion and ER 5# straight arm pulls Fitter push pull and side to side 1 blue band 1# ER elbow on ball 1# reaching 3# biceps 15# triceps Ball vs wall chest high Passive stretch right shoulder in supine Vaso medium pressure 34 degrees  03/02/23 UBE level 4 x 4 minutes Seated row 15# 2x10 Lats 15# 2x10 5# chest press small ROM 2x10 5# straight arm pulls 3# eccentric cabinet reaching 2# cabinet  reaching Supine 3# punches Supine 2# ER from belly 2# supine flexion 3# supine isometric circles PROM right shoulder STM to the right upper arm Vaso medium pressure 34 degrees  02/28/23 Weight 191# UBE level 3 x 5 minutes Seated rows 15# 2x10 Lats 15# 2x10 some assist Chest press 5# 2x10 light assist to start 1# wall slides and circles  Pendulum for pain control with 1# 1# overhead side to side wall slides 1# cabinet reaching flexion, abduction Supine 3# chest press, serratus press, isometric circels 2# ER/IR in supine PROM to the right shoulder STM to the right upper arm Vaso medium pressure 34 degrees  02/23/23 UBE level 3 x 6 mintues Seated rows 15# 2x10 Lats 15# 2x10 Wall slides, circles Red tband ER/IR 1# and 2# cabinet reach to face high shelf Seated 1# ER at 30 degrees abduction AAROM flexion, abduction and ER Vaso right shoulder in sitting low pressure 34 degrees  02/21/23 UBE level 3 x 6 minutes Supine chest press to shoulder flexion 2# x12, 3# x12 SA punches 2# x10, 3# x10 Supine circles 2# then 3# clockwise and counter PROM right shoulder in supine, gentle joint mobs MET into ER pushing into further ranges for IR  Vaso medium pressure 36 degrees  PATIENT EDUCATION: Education details: poc/hep Person educated: Patient Education method: Programmer, multimedia,  Demonstration, Tactile cues, Verbal cues, and Handouts Education comprehension: verbalized understanding  HOME EXERCISE PROGRAM: Access Code: [email or web address] URL: [email or web address] Date: 12/20/2022 Prepared by: Stacie Glaze  Exercises - Seated Shoulder Flexion Towel Slide at Table Top  - 2 x daily - 7 x weekly - 2 sets - 10 reps - 3 hold - Seated Shoulder External Rotation PROM on Table  - 2 x daily - 7 x weekly - 2 sets - 10 reps - 3 hold - Seated Elbow Extension and Shoulder External Rotation AAROM at Table with Towel  - 2 x daily - 7 x weekly - 2 sets - 10 reps - 3 hold - Circular  Shoulder Pendulum with Table Support  - 2 x daily - 7 x weekly - 2 sets - 10 reps - 3 hold  ASSESSMENT:  CLINICAL IMPRESSION: Patient has remained very busy and feels that she is over doing it and now having some left shoulder pain, she has done a lot of cooking the past few days and is very sore and stiff, moreso today and reports that she still has more work to do, 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  Lately the biggest issue has been spasms and knots in the right upper arm.  I really tried to get her not to compensate today with her motions, but we did less strength today due to her activity level this week  OBJECTIVE IMPAIRMENTS: cardiopulmonary status limiting activity, decreased activity tolerance, decreased endurance, decreased ROM, decreased strength, increased edema, increased muscle spasms, impaired flexibility, impaired UE functional use, improper body mechanics, postural dysfunction, and pain .  REHAB POTENTIAL: Good  CLINICAL DECISION MAKING: Evolving/moderate complexity  EVALUATION COMPLEXITY: Low   GOALS: Goals reviewed with patient? Yes  SHORT TERM GOALS: Target date: 01/01/23  Independent with initial HEP Goal status: 12/27/22 MET  LONG TERM GOALS: Target date: 03/22/23  Decrease pain 50% Goal status: 03/22/23 progressing  2.  Dress without difficulty Goal status: progressing 03/22/23  3.  Do hair without difficulty Goal status: able to reach the back of her head at times 03/22/23 progressing  4.  Increase AROM right shoulder flexion to 130 degrees Goal status: 03/22/23 progressing  5.  Increase right shoulder ER to 60 degrees Goal status: progressing 03/22/23  6.  Return to water aerobics and or gym activity Goal status:progressing 03/22/23, is back in the water currently  PLAN:  PT FREQUENCY: 1-2x/week  PT DURATION: 12 weeks  PLANNED INTERVENTIONS: Therapeutic exercises, Therapeutic activity, Neuromuscular re-education,  Balance training, Gait training, Patient/Family education, Self Care, Joint mobilization, Dry Needling, Electrical stimulation, Cryotherapy, Vasopneumatic device, and Manual therapy  PLAN FOR NEXT SESSION:   Continue to progress with protocol Jearld Lesch, PT 03/22/2023, 9:33 AM [facility] [address] [facility]. [place], [place], [address] Phone: [phone]   Fax:  [phone]

## 2023-03-28 ENCOUNTER — Ambulatory Visit: Payer: Medicare Other | Admitting: Physical Therapy

## 2023-03-28 ENCOUNTER — Encounter: Payer: Self-pay | Admitting: Physical Therapy

## 2023-03-28 DIAGNOSIS — M6281 Muscle weakness (generalized): Secondary | ICD-10-CM | POA: Diagnosis not present

## 2023-03-28 DIAGNOSIS — M25611 Stiffness of right shoulder, not elsewhere classified: Secondary | ICD-10-CM

## 2023-03-28 DIAGNOSIS — M25511 Pain in right shoulder: Secondary | ICD-10-CM | POA: Diagnosis not present

## 2023-03-28 DIAGNOSIS — R600 Localized edema: Secondary | ICD-10-CM

## 2023-03-28 DIAGNOSIS — M542 Cervicalgia: Secondary | ICD-10-CM

## 2023-03-28 NOTE — Therapy (Signed)
OUTPATIENT PHYSICAL THERAPY SHOULDER    Patient Name: Veronica Galvan MRN: [id number] DOB:[birth year], 67 y.o., female Today's Date: 03/28/2023  END OF SESSION:  PT End of Session - 03/28/23 0800     Visit Number 29    Date for PT Re-Evaluation 04/26/23    PT Start Time 0800    PT Stop Time 0845    PT Time Calculation (min) 45 min    Activity Tolerance Patient tolerated treatment well    Behavior During Therapy WFL for tasks assessed/performed             Past Medical History:  Diagnosis Date   Allergy    Anxiety    Arthritis    hands   Cataract    Diabetes mellitus without complication (HCC)    type 2   Family history of adverse reaction to anesthesia    son has malignant hyperthermia, daughter does not daughter recently had c section without problems   GERD (gastroesophageal reflux disease)    Headache    sinus   Hyperlipidemia    Hypertension    PONV (postoperative nausea and vomiting)    nausea only   Ulcer, stomach peptic yrs ago   Past Surgical History:  Procedure Laterality Date   APPENDECTOMY     both hells bone spur repair     both heels with metal clips   both shoulder rotator cuff repair     CESAREAN SECTION     x 1   COLONOSCOPY WITH PROPOFOL N/A 09/07/2016   Procedure: COLONOSCOPY WITH PROPOFOL;  Surgeon: Charolett Bumpers, MD;  Location: [facility];  Service: Endoscopy;  Laterality: N/A;   colonscopy  06/2011   polyps   EYE SURGERY     FRACTURE SURGERY     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/10/2022   Procedure: REVERSE SHOULDER ARTHROPLASTY;  Surgeon: Francena Hanly, MD;  Location: [facility];  Service: Orthopedics;  Laterality: Right;  Please follow in room 6 if able   TUBAL LIGATION     VESICO-VAGINAL FISTULA REPAIR     Patient Active Problem List   Diagnosis Date Noted   Gastroesophageal reflux disease 11/16/2021   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 08/18/2021   Dermatofibroma 08/18/2021   History of  malignant neoplasm of skin 08/18/2021   Lentigo 08/18/2021   Melanocytic nevi of trunk 08/18/2021   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   Rosacea 08/18/2021   Actinic keratosis 08/18/2021   Sensorineural hearing loss (SNHL) of left ear with unrestricted hearing of right ear 07/26/2021   Tinnitus of left ear 07/26/2021   Acute recurrent maxillary sinusitis 06/22/2021   Primary hypertension 01/12/2021   Hyperlipidemia associated with type 2 diabetes mellitus (HCC) 01/12/2021   Arthritis 01/12/2021   Type II diabetes mellitus (HCC) 11/25/2019   Ingrown toenail 09/05/2018   Neck pain 01/29/2018   Tick bite 10/24/2017    PCP: Dayton Scrape, [name]  REFERRING PROVIDER: Rennis Chris, MD  REFERRING DIAG: s/p right reverse TSA  THERAPY DIAG:  Acute pain of right shoulder  Stiffness of right shoulder, not elsewhere classified  Localized edema  Cervicalgia  Muscle weakness (generalized)  Rationale for Evaluation and Treatment: Rehabilitation  ONSET DATE: 11/05/22  SUBJECTIVE:  SUBJECTIVE STATEMENT:  Today is not a good day, "Im over worked" PAIN:  Are you having pain? Yes: NPRS scale: 7/10 Pain location: right shoulder Pain description: dull ache at rest, sharp with motions Aggravating factors: quick motions, movements pain up to 10/10 Relieving factors: ice, rest, Tylenol at best 3/10  PRECAUTIONS: Shoulder Protocol in the chart  RED FLAGS: None   WEIGHT BEARING RESTRICTIONS: No  FALLS:  Has patient fallen in last 6 months? Yes. Number of falls 1  LIVING ENVIRONMENT: Lives with: lives alone Lives in: House/apartment Stairs: No Has following equipment at home: None  OCCUPATION: retired  PLOF: Independent  PATIENT GOALS:dress without difficulty, do hair, have good ROM and less  pain  NEXT MD VISIT:   OBJECTIVE:   DIAGNOSTIC FINDINGS:  See above  PATIENT SURVEYS:  FOTO 21  COGNITION: Overall cognitive status: Within functional limits for tasks assessed     SENSATION: WFL  POSTURE: Fwd head, rounded shoulders, elevated and guarded shoulder  UPPER EXTREMITY ROM:   Active ROM Right PROM eval Right PROM Supine 01/05/23 PROM  01/26/23 PROM 02/09/23 AROM sitting 02/14/23 AROM 02/21/23 AROM  03/02/23 AROM  03/22/23 AAROM 03/28/23  Shoulder flexion 35  118  123 90 96 95 100 111  Shoulder extension 5           Shoulder abduction 20  90 92  73 80 83 90 93  Shoulder adduction            Shoulder internal rotation 15           Shoulder external rotation 20  45 30  41  45 50 53  Elbow flexion 120           Elbow extension 5           Wrist flexion            Wrist extension            Wrist ulnar deviation            Wrist radial deviation            Wrist pronation            Wrist supination            (Blank rows = not tested)  UPPER EXTREMITY MMT:  No tested due to recent surgery  MMT Right eval Left eval  Shoulder flexion    Shoulder extension    Shoulder abduction    Shoulder adduction    Shoulder internal rotation    Shoulder external rotation    Middle trapezius    Lower trapezius    Elbow flexion    Elbow extension    Wrist flexion    Wrist extension    Wrist ulnar deviation    Wrist radial deviation    Wrist pronation    Wrist supination    Grip strength (lbs)    (Blank rows = not tested)  PALPATION:  Very tight and tender in the pectoral, upper trap, the entire right upper arm   TODAY'S TREATMENT:  DATE:  03/28/23 UBE L 2 x 3 min each Wall slides and wall circles 20# rows 2x10 20# lats 15# 2x10 STM to  upper trap and the right upper arm  Supine PROM no IR, light joint  distraction Vaso Med pressure 34 degrees  03/22/23 UBE level 4 x 3 minutes Wall slides and wall circles Supine PROM no IR, light joint distraction Supine AROM punches, flexion, ER/IR serratus STM to the neck, upper trap and rhomboid and the right upper arm Vaso low pressure 34 degrees  03/20/23 UBE level 4 x 5 minutes 20# rows 2x10 20# lats x 10, then 15# x 10 5# chest press 2x10 Rhythmic stabilization trying overhead and using only shoulder mms, this was difficult for her Ball vs wall approximation small circles Wall slides, flexion, circles Cabinet reaching 1# and 2# from sink to first shelf and from first to second Supine 2# flexion 60-100 degrees Star gazer stretch Supine 3# serratus, and isometric circles 2# supine ER from belly to 20 degrees Passive stretch right shoulder Vaso low pressure 34 degrees right shoulder  03/16/23 UBE level 4 x 6 minutes 5# straight arm pulls 15# lats Wall slides flexion, circles and overhead side to side Ball vs wall Supine 2# chest press Supine 3# serratus Supine 3# isometric circles Supine 2# ER/IR to belly STM tot he right upper trap, neck and upper arm PROM of the right shoulder within protocol limits Vaso low pressure for swelling of the shoulder and upper arm  03/14/23 UBE level 4 x 6 minutes Red tband row and extension Red tband ER/IR STM to the right shoulder, upper arm, upper trap, rhomboid and neck PROM of the right shoulder Vaso 34 degrees  03/09/23 UBE level 4 x 5 minutes 5# straight arm pulls Wall slides, circles 20# row 20# lats 25# triceps Ball vs wall 2# cabinet reaching Gentle trhowing, underhand and then dart type small weighted ball Passive stretch Vaso medium pressure  03/07/23 UBE x 5 minutes Pulleys flexion and ER 5# straight arm pulls Fitter push pull and side to side 1 blue band 1# ER elbow on ball 1# reaching 3# biceps 15# triceps Ball vs wall chest high Passive stretch right shoulder in  supine Vaso medium pressure 34 degrees  03/02/23 UBE level 4 x 4 minutes Seated row 15# 2x10 Lats 15# 2x10 5# chest press small ROM 2x10 5# straight arm pulls 3# eccentric cabinet reaching 2# cabinet reaching Supine 3# punches Supine 2# ER from belly 2# supine flexion 3# supine isometric circles PROM right shoulder STM to the right upper arm Vaso medium pressure 34 degrees  02/28/23 Weight 191# UBE level 3 x 5 minutes Seated rows 15# 2x10 Lats 15# 2x10 some assist Chest press 5# 2x10 light assist to start 1# wall slides and circles  Pendulum for pain control with 1# 1# overhead side to side wall slides 1# cabinet reaching flexion, abduction Supine 3# chest press, serratus press, isometric circels 2# ER/IR in supine PROM to the right shoulder STM to the right upper arm Vaso medium pressure 34 degrees  02/23/23 UBE level 3 x 6 mintues Seated rows 15# 2x10 Lats 15# 2x10 Wall slides, circles Red tband ER/IR 1# and 2# cabinet reach to face high shelf Seated 1# ER at 30 degrees abduction AAROM flexion, abduction and ER Vaso right shoulder in sitting low pressure 34 degrees  02/21/23 UBE level 3 x 6 minutes Supine chest press to shoulder flexion 2# x12, 3# x12 SA punches 2# x10, 3#  x10 Supine circles 2# then 3# clockwise and counter PROM right shoulder in supine, gentle joint mobs MET into ER pushing into further ranges for IR  Vaso medium pressure 36 degrees  PATIENT EDUCATION: Education details: poc/hep Person educated: Patient Education method: Programmer, multimedia, Facilities manager, Actor cues, Verbal cues, and Handouts Education comprehension: verbalized understanding  HOME EXERCISE PROGRAM: Access Code: [email or web address] URL: [email or web address] Date: 12/20/2022 Prepared by: Stacie Glaze  Exercises - Seated Shoulder Flexion Towel Slide at Table Top  - 2 x daily - 7 x weekly - 2 sets - 10 reps - 3 hold - Seated Shoulder External Rotation PROM on  Table  - 2 x daily - 7 x weekly - 2 sets - 10 reps - 3 hold - Seated Elbow Extension and Shoulder External Rotation AAROM at Table with Towel  - 2 x daily - 7 x weekly - 2 sets - 10 reps - 3 hold - Circular Shoulder Pendulum with Table Support  - 2 x daily - 7 x weekly - 2 sets - 10 reps - 3 hold  ASSESSMENT:  CLINICAL IMPRESSION: Patient continues to remain very busy and feels that she is over doing it and now having some left shoulder pain, she has done a lot of cooking the past few days and is very sore and stiff, moreso today and reports that she still has more work to do for thanksgiving. Added a few more strengthening without issues, lighter weight selected for lat pulls. Tissue density present wit the deltoid muscle. Cues needed for guarding with PROM. Slight improvement present with RUE AROM.  OBJECTIVE IMPAIRMENTS: cardiopulmonary status limiting activity, decreased activity tolerance, decreased endurance, decreased ROM, decreased strength, increased edema, increased muscle spasms, impaired flexibility, impaired UE functional use, improper body mechanics, postural dysfunction, and pain .  REHAB POTENTIAL: Good  CLINICAL DECISION MAKING: Evolving/moderate complexity  EVALUATION COMPLEXITY: Low   GOALS: Goals reviewed with patient? Yes  SHORT TERM GOALS: Target date: 01/01/23  Independent with initial HEP Goal status: 12/27/22 MET  LONG TERM GOALS: Target date: 03/22/23  Decrease pain 50% Goal status: 03/22/23 progressing  2.  Dress without difficulty Goal status: progressing 03/22/23  3.  Do hair without difficulty Goal status: able to reach the back of her head at times 03/22/23 progressing  4.  Increase AROM right shoulder flexion to 130 degrees Goal status: 03/22/23 progressing  5.  Increase right shoulder ER to 60 degrees Goal status: progressing 03/22/23  6.  Return to water aerobics and or gym activity Goal status:progressing 03/22/23, is back in the water  currently  PLAN:  PT FREQUENCY: 1-2x/week  PT DURATION: 12 weeks  PLANNED INTERVENTIONS: Therapeutic exercises, Therapeutic activity, Neuromuscular re-education, Balance training, Gait training, Patient/Family education, Self Care, Joint mobilization, Dry Needling, Electrical stimulation, Cryotherapy, Vasopneumatic device, and Manual therapy  PLAN FOR NEXT SESSION:   Continue to progress with protocol Grayce Sessions, PTA 03/28/2023, 8:02 AM [facility] [address] [facility]. [place], [place], [address] Phone: [phone]   Fax:  [phone]

## 2023-04-04 ENCOUNTER — Encounter: Payer: Self-pay | Admitting: Physical Therapy

## 2023-04-04 ENCOUNTER — Ambulatory Visit: Payer: Medicare Other | Attending: Family | Admitting: Physical Therapy

## 2023-04-04 DIAGNOSIS — M542 Cervicalgia: Secondary | ICD-10-CM | POA: Diagnosis not present

## 2023-04-04 DIAGNOSIS — G8929 Other chronic pain: Secondary | ICD-10-CM | POA: Insufficient documentation

## 2023-04-04 DIAGNOSIS — M25611 Stiffness of right shoulder, not elsewhere classified: Secondary | ICD-10-CM | POA: Insufficient documentation

## 2023-04-04 DIAGNOSIS — M6281 Muscle weakness (generalized): Secondary | ICD-10-CM | POA: Insufficient documentation

## 2023-04-04 DIAGNOSIS — M25511 Pain in right shoulder: Secondary | ICD-10-CM | POA: Diagnosis not present

## 2023-04-04 DIAGNOSIS — R519 Headache, unspecified: Secondary | ICD-10-CM | POA: Diagnosis not present

## 2023-04-04 DIAGNOSIS — R600 Localized edema: Secondary | ICD-10-CM | POA: Diagnosis not present

## 2023-04-04 NOTE — Therapy (Signed)
OUTPATIENT PHYSICAL THERAPY SHOULDER  Progress Note Reporting Period 02/28/23 to 04/04/23 for visits 21-30  See note below for Objective Data and Assessment of Progress/Goals.      Patient Name: Veronica Galvan MRN: [id number] DOB:1956/03/30, 67 y.o., female Today's Date: 04/04/2023  END OF SESSION:  PT End of Session - 04/04/23 0848     Visit Number 30    Date for PT Re-Evaluation 04/26/23    Authorization Type [employer] [place]    PT Start Time [phone]    PT Stop Time 0945    PT Time Calculation (min) 61 min    Activity Tolerance Patient tolerated treatment well    Behavior During Therapy WFL for tasks assessed/performed             Past Medical History:  Diagnosis Date   Allergy    Anxiety    Arthritis    hands   Cataract    Diabetes mellitus without complication (HCC)    type 2   Family history of adverse reaction to anesthesia    son has malignant hyperthermia, daughter does not daughter recently had c section without problems   GERD (gastroesophageal reflux disease)    Headache    sinus   Hyperlipidemia    Hypertension    PONV (postoperative nausea and vomiting)    nausea only   Ulcer, stomach peptic yrs ago   Past Surgical History:  Procedure Laterality Date   APPENDECTOMY     both hells bone spur repair     both heels with metal clips   both shoulder rotator cuff repair     CESAREAN SECTION     x 1   COLONOSCOPY WITH PROPOFOL N/A 09/07/2016   Procedure: COLONOSCOPY WITH PROPOFOL;  Surgeon: Charolett Bumpers, MD;  Location: [facility];  Service: Endoscopy;  Laterality: N/A;   colonscopy  06/2011   polyps   EYE SURGERY     FRACTURE SURGERY     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/10/2022   Procedure: REVERSE SHOULDER ARTHROPLASTY;  Surgeon: Francena Hanly, MD;  Location: [facility];  Service: Orthopedics;  Laterality: Right;  Please follow in room 6 if able   TUBAL LIGATION     VESICO-VAGINAL FISTULA REPAIR     Patient Active Problem List    Diagnosis Date Noted   Gastroesophageal reflux disease 11/16/2021   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 08/18/2021   Dermatofibroma 08/18/2021   History of malignant neoplasm of skin 08/18/2021   Lentigo 08/18/2021   Melanocytic nevi of trunk 08/18/2021   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   Rosacea 08/18/2021   Actinic keratosis 08/18/2021   Sensorineural hearing loss (SNHL) of left ear with unrestricted hearing of right ear 07/26/2021   Tinnitus of left ear 07/26/2021   Acute recurrent maxillary sinusitis 06/22/2021   Primary hypertension 01/12/2021   Hyperlipidemia associated with type 2 diabetes mellitus (HCC) 01/12/2021   Arthritis 01/12/2021   Type II diabetes mellitus (HCC) 11/25/2019   Ingrown toenail 09/05/2018   Neck pain 01/29/2018   Tick bite 10/24/2017    PCP: Dayton Scrape, [name]  REFERRING PROVIDER: Rennis Chris, MD  REFERRING DIAG: s/p right reverse TSA  THERAPY DIAG:  Acute pain of right shoulder  Stiffness of right shoulder, not elsewhere classified  Localized edema  Cervicalgia  Muscle weakness (generalized)  Rationale for Evaluation and Treatment: Rehabilitation  ONSET DATE: 11/05/22  SUBJECTIVE:  SUBJECTIVE STATEMENT:  Reports that she is very sore and that she was doing a lot of activity and cooking over the past 2 weeks with Thanksgiving, for her and family and church PAIN:  Are you having pain? Yes: NPRS scale: 6/10 Pain location: right shoulder Pain description: dull ache at rest, sharp with motions Aggravating factors: quick motions, movements pain up to 10/10 Relieving factors: ice, rest, Tylenol at best 3/10  PRECAUTIONS: Shoulder Protocol in the chart  RED FLAGS: None   WEIGHT BEARING RESTRICTIONS: No  FALLS:  Has patient  fallen in last 6 months? Yes. Number of falls 1  LIVING ENVIRONMENT: Lives with: lives alone Lives in: House/apartment Stairs: No Has following equipment at home: None  OCCUPATION: retired  PLOF: Independent  PATIENT GOALS:dress without difficulty, do hair, have good ROM and less pain  NEXT MD VISIT:   OBJECTIVE:   DIAGNOSTIC FINDINGS:  See above  PATIENT SURVEYS:  FOTO 21  COGNITION: Overall cognitive status: Within functional limits for tasks assessed     SENSATION: WFL  POSTURE: Fwd head, rounded shoulders, elevated and guarded shoulder  UPPER EXTREMITY ROM:   Active ROM Right PROM eval Right PROM Supine 01/05/23 PROM  01/26/23 PROM 02/09/23 AROM sitting 02/14/23 AROM 02/21/23 AROM  03/02/23 AROM  03/22/23 AAROM 03/28/23  Shoulder flexion 35  118  123 90 96 95 100 111  Shoulder extension 5           Shoulder abduction 20  90 92  73 80 83 90 93  Shoulder adduction            Shoulder internal rotation 15           Shoulder external rotation 20  45 30  41  45 50 53  Elbow flexion 120           Elbow extension 5           Wrist flexion            Wrist extension            Wrist ulnar deviation            Wrist radial deviation            Wrist pronation            Wrist supination            (Blank rows = not tested)  UPPER EXTREMITY MMT:  No tested due to recent surgery  MMT Right eval Left eval  Shoulder flexion    Shoulder extension    Shoulder abduction    Shoulder adduction    Shoulder internal rotation    Shoulder external rotation    Middle trapezius    Lower trapezius    Elbow flexion    Elbow extension    Wrist flexion    Wrist extension    Wrist ulnar deviation    Wrist radial deviation    Wrist pronation    Wrist supination    Grip strength (lbs)    (Blank rows = not tested)  PALPATION:  Very tight and tender in the pectoral, upper trap, the entire right upper arm   TODAY'S TREATMENT:  DATE:  04/04/23 UBE level 3 x 6 minutes Rows 20# Lats 20# x10, 15# x10 Wall slides, circles and side to side overhead Chest press 5# 2x10 PROM right shoulder STM to the right upper trap and right upper arm Vaso low pressure right shoulder  03/28/23 UBE L 2 x 3 min each Wall slides and wall circles 20# rows 2x10 20# lats 15# 2x10 STM to  upper trap and the right upper arm  Supine PROM no IR, light joint distraction Vaso Med pressure 34 degrees  03/22/23 UBE level 4 x 3 minutes Wall slides and wall circles Supine PROM no IR, light joint distraction Supine AROM punches, flexion, ER/IR serratus STM to the neck, upper trap and rhomboid and the right upper arm Vaso low pressure 34 degrees  03/20/23 UBE level 4 x 5 minutes 20# rows 2x10 20# lats x 10, then 15# x 10 5# chest press 2x10 Rhythmic stabilization trying overhead and using only shoulder mms, this was difficult for her Ball vs wall approximation small circles Wall slides, flexion, circles Cabinet reaching 1# and 2# from sink to first shelf and from first to second Supine 2# flexion 60-100 degrees Star gazer stretch Supine 3# serratus, and isometric circles 2# supine ER from belly to 20 degrees Passive stretch right shoulder Vaso low pressure 34 degrees right shoulder  03/16/23 UBE level 4 x 6 minutes 5# straight arm pulls 15# lats Wall slides flexion, circles and overhead side to side Ball vs wall Supine 2# chest press Supine 3# serratus Supine 3# isometric circles Supine 2# ER/IR to belly STM tot he right upper trap, neck and upper arm PROM of the right shoulder within protocol limits Vaso low pressure for swelling of the shoulder and upper arm  03/14/23 UBE level 4 x 6 minutes Red tband row and extension Red tband ER/IR STM to the right shoulder, upper arm, upper trap, rhomboid and neck PROM of the right  shoulder Vaso 34 degrees  03/09/23 UBE level 4 x 5 minutes 5# straight arm pulls Wall slides, circles 20# row 20# lats 25# triceps Ball vs wall 2# cabinet reaching Gentle trhowing, underhand and then dart type small weighted ball Passive stretch Vaso medium pressure  03/07/23 UBE x 5 minutes Pulleys flexion and ER 5# straight arm pulls Fitter push pull and side to side 1 blue band 1# ER elbow on ball 1# reaching 3# biceps 15# triceps Ball vs wall chest high Passive stretch right shoulder in supine Vaso medium pressure 34 degrees  PATIENT EDUCATION: Education details: poc/hep Person educated: Patient Education method: Programmer, multimedia, Facilities manager, Actor cues, Verbal cues, and Handouts Education comprehension: verbalized understanding  HOME EXERCISE PROGRAM: Access Code: [email or web address] URL: [email or web address] Date: 12/20/2022 Prepared by: Stacie Glaze  Exercises - Seated Shoulder Flexion Towel Slide at Table Top  - 2 x daily - 7 x weekly - 2 sets - 10 reps - 3 hold - Seated Shoulder External Rotation PROM on Table  - 2 x daily - 7 x weekly - 2 sets - 10 reps - 3 hold - Seated Elbow Extension and Shoulder External Rotation AAROM at Table with Towel  - 2 x daily - 7 x weekly - 2 sets - 10 reps - 3 hold - Circular Shoulder Pendulum with Table Support  - 2 x daily - 7 x weekly - 2 sets - 10 reps - 3 hold  ASSESSMENT:  CLINICAL IMPRESSION: Patient continues to remain very busy and feels that she is over doing it and now  having some left shoulder pain, I have not seen her in about 2 weeks and I feel that she is moving much easier and better as far as getting her right hand to her head and with the wall slides, easier and better motion was noted.  She still with significant knots and tenderness in the right upper trap, neck and right upper arm  OBJECTIVE IMPAIRMENTS: cardiopulmonary status limiting activity, decreased activity tolerance, decreased endurance,  decreased ROM, decreased strength, increased edema, increased muscle spasms, impaired flexibility, impaired UE functional use, improper body mechanics, postural dysfunction, and pain .  REHAB POTENTIAL: Good  CLINICAL DECISION MAKING: Evolving/moderate complexity  EVALUATION COMPLEXITY: Low   GOALS: Goals reviewed with patient? Yes  SHORT TERM GOALS: Target date: 01/01/23  Independent with initial HEP Goal status: 12/27/22 MET  LONG TERM GOALS: Target date: 03/22/23  Decrease pain 50% Goal status: 03/22/23 progressing  2.  Dress without difficulty Goal status: progressing 03/22/23  3.  Do hair without difficulty Goal status: able to reach the back of her head at times 03/22/23 progressing  4.  Increase AROM right shoulder flexion to 130 degrees Goal status: 03/22/23 progressing  5.  Increase right shoulder ER to 60 degrees Goal status: progressing 03/22/23  6.  Return to water aerobics and or gym activity Goal status:progressing 03/22/23, is back in the water currently  PLAN:  PT FREQUENCY: 1-2x/week  PT DURATION: 12 weeks  PLANNED INTERVENTIONS: Therapeutic exercises, Therapeutic activity, Neuromuscular re-education, Balance training, Gait training, Patient/Family education, Self Care, Joint mobilization, Dry Needling, Electrical stimulation, Cryotherapy, Vasopneumatic device, and Manual therapy  PLAN FOR NEXT SESSION:   Still avoid IR behind back, but start to increase functional strength, we did get an order for [place] to do dry needling on her upper trap and neck Jearld Lesch, PT 04/04/2023, 8:49 AM [facility] [address] [facility] [place], [place], [address] Phone: [phone]   Fax:  [phone]

## 2023-04-06 ENCOUNTER — Ambulatory Visit: Payer: Medicare Other | Admitting: Physical Therapy

## 2023-04-06 ENCOUNTER — Encounter: Payer: Self-pay | Admitting: Physical Therapy

## 2023-04-06 DIAGNOSIS — M6281 Muscle weakness (generalized): Secondary | ICD-10-CM

## 2023-04-06 DIAGNOSIS — M542 Cervicalgia: Secondary | ICD-10-CM | POA: Diagnosis not present

## 2023-04-06 DIAGNOSIS — G8929 Other chronic pain: Secondary | ICD-10-CM | POA: Diagnosis not present

## 2023-04-06 DIAGNOSIS — R519 Headache, unspecified: Secondary | ICD-10-CM | POA: Diagnosis not present

## 2023-04-06 DIAGNOSIS — M25511 Pain in right shoulder: Secondary | ICD-10-CM | POA: Diagnosis not present

## 2023-04-06 DIAGNOSIS — R600 Localized edema: Secondary | ICD-10-CM

## 2023-04-06 DIAGNOSIS — M25611 Stiffness of right shoulder, not elsewhere classified: Secondary | ICD-10-CM | POA: Diagnosis not present

## 2023-04-06 NOTE — Therapy (Signed)
OUTPATIENT PHYSICAL THERAPY SHOULDER    Patient Name: Veronica Galvan MRN: [id number] DOB:01-24-1956, 67 y.o., female Today's Date: 04/06/2023  END OF SESSION:  PT End of Session - 04/06/23 1527     Visit Number 31    Date for PT Re-Evaluation 04/26/23    Authorization Type [employer] [place]    PT Start Time 1527    PT Stop Time 1624    PT Time Calculation (min) 57 min    Activity Tolerance Patient tolerated treatment well    Behavior During Therapy WFL for tasks assessed/performed             Past Medical History:  Diagnosis Date   Allergy    Anxiety    Arthritis    hands   Cataract    Diabetes mellitus without complication (HCC)    type 2   Family history of adverse reaction to anesthesia    son has malignant hyperthermia, daughter does not daughter recently had c section without problems   GERD (gastroesophageal reflux disease)    Headache    sinus   Hyperlipidemia    Hypertension    PONV (postoperative nausea and vomiting)    nausea only   Ulcer, stomach peptic yrs ago   Past Surgical History:  Procedure Laterality Date   APPENDECTOMY     both hells bone spur repair     both heels with metal clips   both shoulder rotator cuff repair     CESAREAN SECTION     x 1   COLONOSCOPY WITH PROPOFOL N/A 09/07/2016   Procedure: COLONOSCOPY WITH PROPOFOL;  Surgeon: Charolett Bumpers, MD;  Location: [facility];  Service: Endoscopy;  Laterality: N/A;   colonscopy  06/2011   polyps   EYE SURGERY     FRACTURE SURGERY     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/10/2022   Procedure: REVERSE SHOULDER ARTHROPLASTY;  Surgeon: Francena Hanly, MD;  Location: [facility];  Service: Orthopedics;  Laterality: Right;  Please follow in room 6 if able   TUBAL LIGATION     VESICO-VAGINAL FISTULA REPAIR     Patient Active Problem List   Diagnosis Date Noted   Gastroesophageal reflux disease 11/16/2021   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 08/18/2021    Dermatofibroma 08/18/2021   History of malignant neoplasm of skin 08/18/2021   Lentigo 08/18/2021   Melanocytic nevi of trunk 08/18/2021   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   Rosacea 08/18/2021   Actinic keratosis 08/18/2021   Sensorineural hearing loss (SNHL) of left ear with unrestricted hearing of right ear 07/26/2021   Tinnitus of left ear 07/26/2021   Acute recurrent maxillary sinusitis 06/22/2021   Primary hypertension 01/12/2021   Hyperlipidemia associated with type 2 diabetes mellitus (HCC) 01/12/2021   Arthritis 01/12/2021   Type II diabetes mellitus (HCC) 11/25/2019   Ingrown toenail 09/05/2018   Neck pain 01/29/2018   Tick bite 10/24/2017    PCP: Dayton Scrape, [name]  REFERRING PROVIDER: Rennis Chris, MD  REFERRING DIAG: s/p right reverse TSA  THERAPY DIAG:  Acute pain of right shoulder  Stiffness of right shoulder, not elsewhere classified  Localized edema  Cervicalgia  Muscle weakness (generalized)  Rationale for Evaluation and Treatment: Rehabilitation  ONSET DATE: 11/05/22  SUBJECTIVE:  SUBJECTIVE STATEMENT:  Has been doing well, feels like the movement is starting to come back, but does report right upper arm pain and left low back pain PAIN:  Are you having pain? Yes: NPRS scale: 5/10 Pain location: right shoulder Pain description: dull ache at rest, sharp with motions Aggravating factors: quick motions, movements pain up to 10/10 Relieving factors: ice, rest, Tylenol at best 3/10  PRECAUTIONS: Shoulder Protocol in the chart  RED FLAGS: None   WEIGHT BEARING RESTRICTIONS: No  FALLS:  Has patient fallen in last 6 months? Yes. Number of falls 1  LIVING ENVIRONMENT: Lives with: lives alone Lives in: House/apartment Stairs: No Has following equipment at home:  None  OCCUPATION: retired  PLOF: Independent  PATIENT GOALS:dress without difficulty, do hair, have good ROM and less pain  NEXT MD VISIT:   OBJECTIVE:   DIAGNOSTIC FINDINGS:  See above  PATIENT SURVEYS:  FOTO 21  COGNITION: Overall cognitive status: Within functional limits for tasks assessed     SENSATION: WFL  POSTURE: Fwd head, rounded shoulders, elevated and guarded shoulder  UPPER EXTREMITY ROM:   Active ROM Right PROM eval Right PROM Supine 01/05/23 PROM  01/26/23 PROM 02/09/23 AROM sitting 02/14/23 AROM 02/21/23 AROM  03/02/23 AROM  03/22/23 AAROM 03/28/23  Shoulder flexion 35  118  123 90 96 95 100 111  Shoulder extension 5           Shoulder abduction 20  90 92  73 80 83 90 93  Shoulder adduction            Shoulder internal rotation 15           Shoulder external rotation 20  45 30  41  45 50 53  Elbow flexion 120           Elbow extension 5           Wrist flexion            Wrist extension            Wrist ulnar deviation            Wrist radial deviation            Wrist pronation            Wrist supination            (Blank rows = not tested)  UPPER EXTREMITY MMT:  No tested due to recent surgery  MMT Right eval Left eval  Shoulder flexion    Shoulder extension    Shoulder abduction    Shoulder adduction    Shoulder internal rotation    Shoulder external rotation    Middle trapezius    Lower trapezius    Elbow flexion    Elbow extension    Wrist flexion    Wrist extension    Wrist ulnar deviation    Wrist radial deviation    Wrist pronation    Wrist supination    Grip strength (lbs)    (Blank rows = not tested)  PALPATION:  Very tight and tender in the pectoral, upper trap, the entire right upper arm   TODAY'S TREATMENT:  DATE:  04/06/23 UBE level 3 x 6 minutes 2# wall slides, flexion,  circles and side to side overhead Green tband row and extension Supine 1# ER/and IR to belly 2# chest press, isometric circles, and serratus push Passive stretch to the left shoulder all motions to her tolerance STM to the left low back, the right upper trap, right upper arm Vaso 34 degrees low pressure  04/04/23 UBE level 3 x 6 minutes Rows 20# Lats 20# x10, 15# x10 Wall slides, circles and side to side overhead Chest press 5# 2x10 PROM right shoulder STM to the right upper trap and right upper arm Vaso low pressure right shoulder  03/28/23 UBE L 2 x 3 min each Wall slides and wall circles 20# rows 2x10 20# lats 15# 2x10 STM to  upper trap and the right upper arm  Supine PROM no IR, light joint distraction Vaso Med pressure 34 degrees  03/22/23 UBE level 4 x 3 minutes Wall slides and wall circles Supine PROM no IR, light joint distraction Supine AROM punches, flexion, ER/IR serratus STM to the neck, upper trap and rhomboid and the right upper arm Vaso low pressure 34 degrees  03/20/23 UBE level 4 x 5 minutes 20# rows 2x10 20# lats x 10, then 15# x 10 5# chest press 2x10 Rhythmic stabilization trying overhead and using only shoulder mms, this was difficult for her Ball vs wall approximation small circles Wall slides, flexion, circles Cabinet reaching 1# and 2# from sink to first shelf and from first to second Supine 2# flexion 60-100 degrees Star gazer stretch Supine 3# serratus, and isometric circles 2# supine ER from belly to 20 degrees Passive stretch right shoulder Vaso low pressure 34 degrees right shoulder  03/16/23 UBE level 4 x 6 minutes 5# straight arm pulls 15# lats Wall slides flexion, circles and overhead side to side Ball vs wall Supine 2# chest press Supine 3# serratus Supine 3# isometric circles Supine 2# ER/IR to belly STM tot he right upper trap, neck and upper arm PROM of the right shoulder within protocol limits Vaso low pressure for  swelling of the shoulder and upper arm  03/14/23 UBE level 4 x 6 minutes Red tband row and extension Red tband ER/IR STM to the right shoulder, upper arm, upper trap, rhomboid and neck PROM of the right shoulder Vaso 34 degrees  03/09/23 UBE level 4 x 5 minutes 5# straight arm pulls Wall slides, circles 20# row 20# lats 25# triceps Ball vs wall 2# cabinet reaching Gentle trhowing, underhand and then dart type small weighted ball Passive stretch Vaso medium pressure  03/07/23 UBE x 5 minutes Pulleys flexion and ER 5# straight arm pulls Fitter push pull and side to side 1 blue band 1# ER elbow on ball 1# reaching 3# biceps 15# triceps Ball vs wall chest high Passive stretch right shoulder in supine Vaso medium pressure 34 degrees  PATIENT EDUCATION: Education details: poc/hep Person educated: Patient Education method: Programmer, multimedia, Facilities manager, Actor cues, Verbal cues, and Handouts Education comprehension: verbalized understanding  HOME EXERCISE PROGRAM: Access Code: [email or web address] URL: [email or web address] Date: 12/20/2022 Prepared by: Stacie Glaze  Exercises - Seated Shoulder Flexion Towel Slide at Table Top  - 2 x daily - 7 x weekly - 2 sets - 10 reps - 3 hold - Seated Shoulder External Rotation PROM on Table  - 2 x daily - 7 x weekly - 2 sets - 10 reps - 3 hold - Seated Elbow Extension and Shoulder External Rotation AAROM at  Table with Towel  - 2 x daily - 7 x weekly - 2 sets - 10 reps - 3 hold - Circular Shoulder Pendulum with Table Support  - 2 x daily - 7 x weekly - 2 sets - 10 reps - 3 hold  ASSESSMENT:  CLINICAL IMPRESSION: Patient continues to remain very busy she is c/o right upper arm pain and left low back pain, I feel the left low back pain is from her compensating when she tried to raise the right arm up, she is tight and tender here, has some increased right upper arm pain today as well, continues to move easier  OBJECTIVE  IMPAIRMENTS: cardiopulmonary status limiting activity, decreased activity tolerance, decreased endurance, decreased ROM, decreased strength, increased edema, increased muscle spasms, impaired flexibility, impaired UE functional use, improper body mechanics, postural dysfunction, and pain .  REHAB POTENTIAL: Good  CLINICAL DECISION MAKING: Evolving/moderate complexity  EVALUATION COMPLEXITY: Low   GOALS: Goals reviewed with patient? Yes  SHORT TERM GOALS: Target date: 01/01/23  Independent with initial HEP Goal status: 12/27/22 MET  LONG TERM GOALS: Target date: 03/22/23  Decrease pain 50% Goal status: 03/22/23 progressing  2.  Dress without difficulty Goal status: progressing 03/22/23  3.  Do hair without difficulty Goal status: able to reach the back of her head at times 03/22/23 progressing  4.  Increase AROM right shoulder flexion to 130 degrees Goal status: 04/06/23 progressing  5.  Increase right shoulder ER to 60 degrees Goal status: progressing 04/06/23  6.  Return to water aerobics and or gym activity Goal status:progressing 03/22/23, is back in the water currently  PLAN:  PT FREQUENCY: 1-2x/week  PT DURATION: 12 weeks  PLANNED INTERVENTIONS: Therapeutic exercises, Therapeutic activity, Neuromuscular re-education, Balance training, Gait training, Patient/Family education, Self Care, Joint mobilization, Dry Needling, Electrical stimulation, Cryotherapy, Vasopneumatic device, and Manual therapy  PLAN FOR NEXT SESSION:   Still avoid IR behind back, but start to increase functional strength, we did get an order for [place] to do dry needling on her upper trap and neck Jearld Lesch, PT 04/06/2023, 3:27 PM [facility] at [facility] [address] [facility] [place], [place], [address] Phone: [phone]   Fax:  [phone]

## 2023-04-11 ENCOUNTER — Ambulatory Visit: Payer: Medicare Other | Admitting: Physical Therapy

## 2023-04-11 ENCOUNTER — Encounter: Payer: Self-pay | Admitting: Physical Therapy

## 2023-04-11 DIAGNOSIS — R519 Headache, unspecified: Secondary | ICD-10-CM | POA: Diagnosis not present

## 2023-04-11 DIAGNOSIS — R600 Localized edema: Secondary | ICD-10-CM | POA: Diagnosis not present

## 2023-04-11 DIAGNOSIS — M25611 Stiffness of right shoulder, not elsewhere classified: Secondary | ICD-10-CM | POA: Diagnosis not present

## 2023-04-11 DIAGNOSIS — M6281 Muscle weakness (generalized): Secondary | ICD-10-CM

## 2023-04-11 DIAGNOSIS — G8929 Other chronic pain: Secondary | ICD-10-CM | POA: Diagnosis not present

## 2023-04-11 DIAGNOSIS — M542 Cervicalgia: Secondary | ICD-10-CM | POA: Diagnosis not present

## 2023-04-11 DIAGNOSIS — M25511 Pain in right shoulder: Secondary | ICD-10-CM

## 2023-04-11 NOTE — Therapy (Signed)
OUTPATIENT PHYSICAL THERAPY SHOULDER    Patient Name: Veronica Galvan MRN: [id number] DOB:[birth year], 67 y.o., female Today's Date: 04/11/2023  END OF SESSION:  PT End of Session - 04/11/23 1405     Visit Number 32    Date for PT Re-Evaluation 04/26/23    Authorization Type [employer] [place]    PT Start Time 1355    PT Stop Time 1453    PT Time Calculation (min) 58 min    Activity Tolerance Patient tolerated treatment well    Behavior During Therapy WFL for tasks assessed/performed             Past Medical History:  Diagnosis Date   Allergy    Anxiety    Arthritis    hands   Cataract    Diabetes mellitus without complication (HCC)    type 2   Family history of adverse reaction to anesthesia    son has malignant hyperthermia, daughter does not daughter recently had c section without problems   GERD (gastroesophageal reflux disease)    Headache    sinus   Hyperlipidemia    Hypertension    PONV (postoperative nausea and vomiting)    nausea only   Ulcer, stomach peptic yrs ago   Past Surgical History:  Procedure Laterality Date   APPENDECTOMY     both hells bone spur repair     both heels with metal clips   both shoulder rotator cuff repair     CESAREAN SECTION     x 1   COLONOSCOPY WITH PROPOFOL N/A 09/07/2016   Procedure: COLONOSCOPY WITH PROPOFOL;  Surgeon: Charolett Bumpers, MD;  Location: [facility];  Service: Endoscopy;  Laterality: N/A;   colonscopy  06/2011   polyps   EYE SURGERY     FRACTURE SURGERY     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/10/2022   Procedure: REVERSE SHOULDER ARTHROPLASTY;  Surgeon: Francena Hanly, MD;  Location: [facility];  Service: Orthopedics;  Laterality: Right;  Please follow in room 6 if able   TUBAL LIGATION     VESICO-VAGINAL FISTULA REPAIR     Patient Active Problem List   Diagnosis Date Noted   Gastroesophageal reflux disease 11/16/2021   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 08/18/2021    Dermatofibroma 08/18/2021   History of malignant neoplasm of skin 08/18/2021   Lentigo 08/18/2021   Melanocytic nevi of trunk 08/18/2021   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   Rosacea 08/18/2021   Actinic keratosis 08/18/2021   Sensorineural hearing loss (SNHL) of left ear with unrestricted hearing of right ear 07/26/2021   Tinnitus of left ear 07/26/2021   Acute recurrent maxillary sinusitis 06/22/2021   Primary hypertension 01/12/2021   Hyperlipidemia associated with type 2 diabetes mellitus (HCC) 01/12/2021   Arthritis 01/12/2021   Type II diabetes mellitus (HCC) 11/25/2019   Ingrown toenail 09/05/2018   Neck pain 01/29/2018   Tick bite 10/24/2017    PCP: Dayton Scrape, [name]  REFERRING PROVIDER: Rennis Chris, MD  REFERRING DIAG: s/p right reverse TSA  THERAPY DIAG:  Acute pain of right shoulder  Stiffness of right shoulder, not elsewhere classified  Localized edema  Cervicalgia  Muscle weakness (generalized)  Rationale for Evaluation and Treatment: Rehabilitation  ONSET DATE: 11/05/22  SUBJECTIVE:  SUBJECTIVE STATEMENT:  Really hurting more and very stiff, doing so much with the holidays PAIN:  Are you having pain? Yes: NPRS scale: 7/10 Pain location: right shoulder Pain description: dull ache at rest, sharp with motions Aggravating factors: quick motions, movements pain up to 10/10 Relieving factors: ice, rest, Tylenol at best 3/10  PRECAUTIONS: Shoulder Protocol in the chart  RED FLAGS: None   WEIGHT BEARING RESTRICTIONS: No  FALLS:  Has patient fallen in last 6 months? Yes. Number of falls 1  LIVING ENVIRONMENT: Lives with: lives alone Lives in: House/apartment Stairs: No Has following equipment at home: None  OCCUPATION: retired  PLOF: Independent  PATIENT  GOALS:dress without difficulty, do hair, have good ROM and less pain  NEXT MD VISIT:   OBJECTIVE:   DIAGNOSTIC FINDINGS:  See above  PATIENT SURVEYS:  FOTO 21  COGNITION: Overall cognitive status: Within functional limits for tasks assessed     SENSATION: WFL  POSTURE: Fwd head, rounded shoulders, elevated and guarded shoulder  UPPER EXTREMITY ROM:   Active ROM Right PROM eval Right PROM Supine 01/05/23 PROM  01/26/23 PROM 02/09/23 AROM sitting 02/14/23 AROM 02/21/23 AROM  03/02/23 AROM  03/22/23 AAROM 03/28/23  Shoulder flexion 35  118  123 90 96 95 100 111  Shoulder extension 5           Shoulder abduction 20  90 92  73 80 83 90 93  Shoulder adduction            Shoulder internal rotation 15           Shoulder external rotation 20  45 30  41  45 50 53  Elbow flexion 120           Elbow extension 5           Wrist flexion            Wrist extension            Wrist ulnar deviation            Wrist radial deviation            Wrist pronation            Wrist supination            (Blank rows = not tested)  UPPER EXTREMITY MMT:  No tested due to recent surgery  MMT Right eval Left eval  Shoulder flexion    Shoulder extension    Shoulder abduction    Shoulder adduction    Shoulder internal rotation    Shoulder external rotation    Middle trapezius    Lower trapezius    Elbow flexion    Elbow extension    Wrist flexion    Wrist extension    Wrist ulnar deviation    Wrist radial deviation    Wrist pronation    Wrist supination    Grip strength (lbs)    (Blank rows = not tested)  PALPATION:  Very tight and tender in the pectoral, upper trap, the entire right upper arm   TODAY'S TREATMENT:  DATE:  04/11/23 UBE level 3 x 4 minutes 5# row 5# extension Red tband ER/IR 2# serratus 3# isometric circles PROM right  shoulder  STM to the upper trap and upper arm Vaso medium pressure 34 degrees  04/06/23 UBE level 3 x 6 minutes 2# wall slides, flexion, circles and side to side overhead Green tband row and extension Supine 1# ER/and IR to belly 2# chest press, isometric circles, and serratus push Passive stretch to the left shoulder all motions to her tolerance STM to the left low back, the right upper trap, right upper arm Vaso 34 degrees low pressure  04/04/23 UBE level 3 x 6 minutes Rows 20# Lats 20# x10, 15# x10 Wall slides, circles and side to side overhead Chest press 5# 2x10 PROM right shoulder STM to the right upper trap and right upper arm Vaso low pressure right shoulder  03/28/23 UBE L 2 x 3 min each Wall slides and wall circles 20# rows 2x10 20# lats 15# 2x10 STM to  upper trap and the right upper arm  Supine PROM no IR, light joint distraction Vaso Med pressure 34 degrees  03/22/23 UBE level 4 x 3 minutes Wall slides and wall circles Supine PROM no IR, light joint distraction Supine AROM punches, flexion, ER/IR serratus STM to the neck, upper trap and rhomboid and the right upper arm Vaso low pressure 34 degrees  03/20/23 UBE level 4 x 5 minutes 20# rows 2x10 20# lats x 10, then 15# x 10 5# chest press 2x10 Rhythmic stabilization trying overhead and using only shoulder mms, this was difficult for her Ball vs wall approximation small circles Wall slides, flexion, circles Cabinet reaching 1# and 2# from sink to first shelf and from first to second Supine 2# flexion 60-100 degrees Star gazer stretch Supine 3# serratus, and isometric circles 2# supine ER from belly to 20 degrees Passive stretch right shoulder Vaso low pressure 34 degrees right shoulder  03/16/23 UBE level 4 x 6 minutes 5# straight arm pulls 15# lats Wall slides flexion, circles and overhead side to side Ball vs wall Supine 2# chest press Supine 3# serratus Supine 3# isometric circles Supine  2# ER/IR to belly STM tot he right upper trap, neck and upper arm PROM of the right shoulder within protocol limits Vaso low pressure for swelling of the shoulder and upper arm  03/14/23 UBE level 4 x 6 minutes Red tband row and extension Red tband ER/IR STM to the right shoulder, upper arm, upper trap, rhomboid and neck PROM of the right shoulder Vaso 34 degrees  03/09/23 UBE level 4 x 5 minutes 5# straight arm pulls Wall slides, circles 20# row 20# lats 25# triceps Ball vs wall 2# cabinet reaching Gentle trhowing, underhand and then dart type small weighted ball Passive stretch Vaso medium pressure  03/07/23 UBE x 5 minutes Pulleys flexion and ER 5# straight arm pulls Fitter push pull and side to side 1 blue band 1# ER elbow on ball 1# reaching 3# biceps 15# triceps Ball vs wall chest high Passive stretch right shoulder in supine Vaso medium pressure 34 degrees  PATIENT EDUCATION: Education details: poc/hep Person educated: Patient Education method: Programmer, multimedia, Facilities manager, Actor cues, Verbal cues, and Handouts Education comprehension: verbalized understanding  HOME EXERCISE PROGRAM: Access Code: [email or web address] URL: [email or web address] Date: 12/20/2022 Prepared by: Stacie Glaze  Exercises - Seated Shoulder Flexion Towel Slide at Table Top  - 2 x daily - 7 x weekly - 2 sets - 10 reps - 3  hold - Seated Shoulder External Rotation PROM on Table  - 2 x daily - 7 x weekly - 2 sets - 10 reps - 3 hold - Seated Elbow Extension and Shoulder External Rotation AAROM at Table with Towel  - 2 x daily - 7 x weekly - 2 sets - 10 reps - 3 hold - Circular Shoulder Pendulum with Table Support  - 2 x daily - 7 x weekly - 2 sets - 10 reps - 3 hold  ASSESSMENT:  CLINICAL IMPRESSION: Patient continues to remain very busy she is c/o right upper arm pain and left low back pain, I feel the left low back pain is from her compensating when she tried to raise the  right arm up,has significant knots in the upper trap, neck, deltoid, bicep and the teres today  OBJECTIVE IMPAIRMENTS: cardiopulmonary status limiting activity, decreased activity tolerance, decreased endurance, decreased ROM, decreased strength, increased edema, increased muscle spasms, impaired flexibility, impaired UE functional use, improper body mechanics, postural dysfunction, and pain .  REHAB POTENTIAL: Good  CLINICAL DECISION MAKING: Evolving/moderate complexity  EVALUATION COMPLEXITY: Low   GOALS: Goals reviewed with patient? Yes  SHORT TERM GOALS: Target date: 01/01/23  Independent with initial HEP Goal status: 12/27/22 MET  LONG TERM GOALS: Target date: 03/22/23  Decrease pain 50% Goal status: 03/22/23 progressing  2.  Dress without difficulty Goal status: progressing 03/22/23  3.  Do hair without difficulty Goal status: able to reach the back of her head at times 03/22/23 progressing  4.  Increase AROM right shoulder flexion to 130 degrees Goal status: 04/06/23 progressing  5.  Increase right shoulder ER to 60 degrees Goal status: progressing 04/06/23  6.  Return to water aerobics and or gym activity Goal status:progressing 03/22/23, is back in the water currently  PLAN:  PT FREQUENCY: 1-2x/week  PT DURATION: 12 weeks  PLANNED INTERVENTIONS: Therapeutic exercises, Therapeutic activity, Neuromuscular re-education, Balance training, Gait training, Patient/Family education, Self Care, Joint mobilization, Dry Needling, Electrical stimulation, Cryotherapy, Vasopneumatic device, and Manual therapy  PLAN FOR NEXT SESSION:   Still avoid IR behind back, but start to increase functional strength, we did get an order for [place] to do dry needling on her upper trap and neck Jearld Lesch, PT 04/11/2023, 2:06 PM [place] [facility] at [facility] [address] [facility] [place], [place], [address] Phone: [phone]   Fax:  [phone]

## 2023-04-13 ENCOUNTER — Ambulatory Visit: Payer: Medicare Other | Admitting: Physical Therapy

## 2023-04-13 ENCOUNTER — Encounter: Payer: Self-pay | Admitting: Physical Therapy

## 2023-04-13 DIAGNOSIS — M25611 Stiffness of right shoulder, not elsewhere classified: Secondary | ICD-10-CM | POA: Diagnosis not present

## 2023-04-13 DIAGNOSIS — M6281 Muscle weakness (generalized): Secondary | ICD-10-CM

## 2023-04-13 DIAGNOSIS — M542 Cervicalgia: Secondary | ICD-10-CM

## 2023-04-13 DIAGNOSIS — M25511 Pain in right shoulder: Secondary | ICD-10-CM

## 2023-04-13 DIAGNOSIS — R600 Localized edema: Secondary | ICD-10-CM | POA: Diagnosis not present

## 2023-04-13 DIAGNOSIS — G8929 Other chronic pain: Secondary | ICD-10-CM | POA: Diagnosis not present

## 2023-04-13 DIAGNOSIS — R519 Headache, unspecified: Secondary | ICD-10-CM | POA: Diagnosis not present

## 2023-04-13 NOTE — Therapy (Signed)
OUTPATIENT PHYSICAL THERAPY SHOULDER    Patient Name: Veronica Galvan MRN: [id number] DOB:Dec 11, 1955, 67 y.o., female Today's Date: 04/13/2023  END OF SESSION:  PT End of Session - 04/13/23 0806     Visit Number 33    Date for PT Re-Evaluation 04/26/23    Authorization Type [employer] [place]    PT Start Time [phone]    PT Stop Time 0853    PT Time Calculation (min) 58 min    Activity Tolerance Patient tolerated treatment well    Behavior During Therapy WFL for tasks assessed/performed             Past Medical History:  Diagnosis Date   Allergy    Anxiety    Arthritis    hands   Cataract    Diabetes mellitus without complication (HCC)    type 2   Family history of adverse reaction to anesthesia    son has malignant hyperthermia, daughter does not daughter recently had c section without problems   GERD (gastroesophageal reflux disease)    Headache    sinus   Hyperlipidemia    Hypertension    PONV (postoperative nausea and vomiting)    nausea only   Ulcer, stomach peptic yrs ago   Past Surgical History:  Procedure Laterality Date   APPENDECTOMY     both hells bone spur repair     both heels with metal clips   both shoulder rotator cuff repair     CESAREAN SECTION     x 1   COLONOSCOPY WITH PROPOFOL N/A 09/07/2016   Procedure: COLONOSCOPY WITH PROPOFOL;  Surgeon: Charolett Bumpers, MD;  Location: [facility];  Service: Endoscopy;  Laterality: N/A;   colonscopy  06/2011   polyps   EYE SURGERY     FRACTURE SURGERY     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/10/2022   Procedure: REVERSE SHOULDER ARTHROPLASTY;  Surgeon: Francena Hanly, MD;  Location: [facility];  Service: Orthopedics;  Laterality: Right;  Please follow in room 6 if able   TUBAL LIGATION     VESICO-VAGINAL FISTULA REPAIR     Patient Active Problem List   Diagnosis Date Noted   Gastroesophageal reflux disease 11/16/2021   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 08/18/2021    Dermatofibroma 08/18/2021   History of malignant neoplasm of skin 08/18/2021   Lentigo 08/18/2021   Melanocytic nevi of trunk 08/18/2021   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   Rosacea 08/18/2021   Actinic keratosis 08/18/2021   Sensorineural hearing loss (SNHL) of left ear with unrestricted hearing of right ear 07/26/2021   Tinnitus of left ear 07/26/2021   Acute recurrent maxillary sinusitis 06/22/2021   Primary hypertension 01/12/2021   Hyperlipidemia associated with type 2 diabetes mellitus (HCC) 01/12/2021   Arthritis 01/12/2021   Type II diabetes mellitus (HCC) 11/25/2019   Ingrown toenail 09/05/2018   Neck pain 01/29/2018   Tick bite 10/24/2017    PCP: Dayton Scrape, [name]  REFERRING PROVIDER: Rennis Chris, MD  REFERRING DIAG: s/p right reverse TSA  THERAPY DIAG:  Acute pain of right shoulder  Stiffness of right shoulder, not elsewhere classified  Localized edema  Cervicalgia  Muscle weakness (generalized)  Rationale for Evaluation and Treatment: Rehabilitation  ONSET DATE: 11/05/22  SUBJECTIVE:  SUBJECTIVE STATEMENT:  Reports that "it is really angry today" she has been very busy coking and cleaning PAIN:  Are you having pain? Yes: NPRS scale: 7/10 Pain location: right shoulder Pain description: dull ache at rest, sharp with motions Aggravating factors: quick motions, movements pain up to 10/10 Relieving factors: ice, rest, Tylenol at best 3/10  PRECAUTIONS: Shoulder Protocol in the chart  RED FLAGS: None   WEIGHT BEARING RESTRICTIONS: No  FALLS:  Has patient fallen in last 6 months? Yes. Number of falls 1  LIVING ENVIRONMENT: Lives with: lives alone Lives in: House/apartment Stairs: No Has following equipment at home: None  OCCUPATION: retired  PLOF:  Independent  PATIENT GOALS:dress without difficulty, do hair, have good ROM and less pain  NEXT MD VISIT:   OBJECTIVE:   DIAGNOSTIC FINDINGS:  See above  PATIENT SURVEYS:  FOTO 21  COGNITION: Overall cognitive status: Within functional limits for tasks assessed     SENSATION: WFL  POSTURE: Fwd head, rounded shoulders, elevated and guarded shoulder  UPPER EXTREMITY ROM:   Active ROM Right PROM eval Right PROM Supine 01/05/23 PROM  01/26/23 PROM 02/09/23 AROM sitting 02/14/23 AROM 02/21/23 AROM  03/02/23 AROM  03/22/23 AAROM 03/28/23 AROM Standing 04/13/23  Shoulder flexion 35  118  123 90 96 95 100 111 110  Shoulder extension 5            Shoulder abduction 20  90 92  73 80 83 90 93 90  Shoulder adduction             Shoulder internal rotation 15            Shoulder external rotation 20  45 30  41  45 50 53 56  Elbow flexion 120            Elbow extension 5            Wrist flexion             Wrist extension             Wrist ulnar deviation             Wrist radial deviation             Wrist pronation             Wrist supination             (Blank rows = not tested)  UPPER EXTREMITY MMT:  No tested due to recent surgery  MMT Right eval Left eval  Shoulder flexion    Shoulder extension    Shoulder abduction    Shoulder adduction    Shoulder internal rotation    Shoulder external rotation    Middle trapezius    Lower trapezius    Elbow flexion    Elbow extension    Wrist flexion    Wrist extension    Wrist ulnar deviation    Wrist radial deviation    Wrist pronation    Wrist supination    Grip strength (lbs)    (Blank rows = not tested)  PALPATION:  Very tight and tender in the pectoral, upper trap, the entire right upper arm   TODAY'S TREATMENT:  DATE:  04/13/23 UBE level 4 x 5 minutes 2# wall slides  with help ecdentrics 2# circles Red tband horizontal abduction at sides Red tband ER Lats 15#  2# wate bar reach on finger ladder Supine 2# isometric circles PROM STM tot he upper trap, neck and upper arm Measured active ROM in standing Vaso low pressure 34 degrees  04/11/23 UBE level 3 x 4 minutes 5# row 5# extension Red tband ER/IR 2# serratus 3# isometric circles PROM right shoulder  STM to the upper trap and upper arm Vaso medium pressure 34 degrees  04/06/23 UBE level 3 x 6 minutes 2# wall slides, flexion, circles and side to side overhead Green tband row and extension Supine 1# ER/and IR to belly 2# chest press, isometric circles, and serratus push Passive stretch to the left shoulder all motions to her tolerance STM to the left low back, the right upper trap, right upper arm Vaso 34 degrees low pressure  04/04/23 UBE level 3 x 6 minutes Rows 20# Lats 20# x10, 15# x10 Wall slides, circles and side to side overhead Chest press 5# 2x10 PROM right shoulder STM to the right upper trap and right upper arm Vaso low pressure right shoulder  03/28/23 UBE L 2 x 3 min each Wall slides and wall circles 20# rows 2x10 20# lats 15# 2x10 STM to  upper trap and the right upper arm  Supine PROM no IR, light joint distraction Vaso Med pressure 34 degrees  03/22/23 UBE level 4 x 3 minutes Wall slides and wall circles Supine PROM no IR, light joint distraction Supine AROM punches, flexion, ER/IR serratus STM to the neck, upper trap and rhomboid and the right upper arm Vaso low pressure 34 degrees  03/20/23 UBE level 4 x 5 minutes 20# rows 2x10 20# lats x 10, then 15# x 10 5# chest press 2x10 Rhythmic stabilization trying overhead and using only shoulder mms, this was difficult for her Ball vs wall approximation small circles Wall slides, flexion, circles Cabinet reaching 1# and 2# from sink to first shelf and from first to second Supine 2# flexion 60-100  degrees Star gazer stretch Supine 3# serratus, and isometric circles 2# supine ER from belly to 20 degrees Passive stretch right shoulder Vaso low pressure 34 degrees right shoulder  03/16/23 UBE level 4 x 6 minutes 5# straight arm pulls 15# lats Wall slides flexion, circles and overhead side to side Ball vs wall Supine 2# chest press Supine 3# serratus Supine 3# isometric circles Supine 2# ER/IR to belly STM tot he right upper trap, neck and upper arm PROM of the right shoulder within protocol limits Vaso low pressure for swelling of the shoulder and upper arm  03/14/23 UBE level 4 x 6 minutes Red tband row and extension Red tband ER/IR STM to the right shoulder, upper arm, upper trap, rhomboid and neck PROM of the right shoulder Vaso 34 degrees  03/09/23 UBE level 4 x 5 minutes 5# straight arm pulls Wall slides, circles 20# row 20# lats 25# triceps Ball vs wall 2# cabinet reaching Gentle trhowing, underhand and then dart type small weighted ball Passive stretch Vaso medium pressure  03/07/23 UBE x 5 minutes Pulleys flexion and ER 5# straight arm pulls Fitter push pull and side to side 1 blue band 1# ER elbow on ball 1# reaching 3# biceps 15# triceps Ball vs wall chest high Passive stretch right shoulder in supine Vaso medium pressure 34 degrees  PATIENT EDUCATION: Education details: poc/hep Person  educated: Patient Education method: Explanation, Demonstration, Tactile cues, Verbal cues, and Handouts Education comprehension: verbalized understanding  HOME EXERCISE PROGRAM: Access Code: [email or web address] URL: [email or web address] Date: 12/20/2022 Prepared by: Stacie Glaze  Exercises - Seated Shoulder Flexion Towel Slide at Table Top  - 2 x daily - 7 x weekly - 2 sets - 10 reps - 3 hold - Seated Shoulder External Rotation PROM on Table  - 2 x daily - 7 x weekly - 2 sets - 10 reps - 3 hold - Seated Elbow Extension and Shoulder External  Rotation AAROM at Table with Towel  - 2 x daily - 7 x weekly - 2 sets - 10 reps - 3 hold - Circular Shoulder Pendulum with Table Support  - 2 x daily - 7 x weekly - 2 sets - 10 reps - 3 hold  ASSESSMENT:  CLINICAL IMPRESSION: Patient continues to remain very busy she is c/o right upper arm pain she reports that the arm and shoulder are angry today, she has been doing a lot.  I measured her ROM and she is improving even with the pain and flared up shoulder OBJECTIVE IMPAIRMENTS: cardiopulmonary status limiting activity, decreased activity tolerance, decreased endurance, decreased ROM, decreased strength, increased edema, increased muscle spasms, impaired flexibility, impaired UE functional use, improper body mechanics, postural dysfunction, and pain .  REHAB POTENTIAL: Good  CLINICAL DECISION MAKING: Evolving/moderate complexity  EVALUATION COMPLEXITY: Low   GOALS: Goals reviewed with patient? Yes  SHORT TERM GOALS: Target date: 01/01/23  Independent with initial HEP Goal status: 12/27/22 MET  LONG TERM GOALS: Target date: 03/22/23  Decrease pain 50% Goal status: 03/22/23 progressing  2.  Dress without difficulty Goal status: progressing 03/22/23  3.  Do hair without difficulty Goal status: able to reach the back of her head at times 03/22/23 progressing  4.  Increase AROM right shoulder flexion to 130 degrees Goal status: 04/06/23 progressing  5.  Increase right shoulder ER to 60 degrees Goal status: progressing 04/06/23  6.  Return to water aerobics and or gym activity Goal status:progressing 03/22/23, is back in the water currently  PLAN:  PT FREQUENCY: 1-2x/week  PT DURATION: 12 weeks  PLANNED INTERVENTIONS: Therapeutic exercises, Therapeutic activity, Neuromuscular re-education, Balance training, Gait training, Patient/Family education, Self Care, Joint mobilization, Dry Needling, Electrical stimulation, Cryotherapy, Vasopneumatic device, and Manual therapy  PLAN FOR  NEXT SESSION:   Still avoid IR behind back, but start to increase functional strength, we did get an order for [place] to do dry needling on her upper trap and neck Jearld Lesch, PT 04/13/2023, 8:06 AM [place] [facility] at [facility] [address] [facility]. [place], [place], [address] Phone: [phone]   Fax:  [phone]

## 2023-04-17 DIAGNOSIS — Z96611 Presence of right artificial shoulder joint: Secondary | ICD-10-CM | POA: Diagnosis not present

## 2023-04-17 DIAGNOSIS — S42291D Other displaced fracture of upper end of right humerus, subsequent encounter for fracture with routine healing: Secondary | ICD-10-CM | POA: Diagnosis not present

## 2023-04-18 ENCOUNTER — Encounter: Payer: Self-pay | Admitting: Physical Therapy

## 2023-04-18 ENCOUNTER — Ambulatory Visit: Payer: Medicare Other | Admitting: Physical Therapy

## 2023-04-18 DIAGNOSIS — M25611 Stiffness of right shoulder, not elsewhere classified: Secondary | ICD-10-CM | POA: Diagnosis not present

## 2023-04-18 DIAGNOSIS — M542 Cervicalgia: Secondary | ICD-10-CM | POA: Diagnosis not present

## 2023-04-18 DIAGNOSIS — M6281 Muscle weakness (generalized): Secondary | ICD-10-CM

## 2023-04-18 DIAGNOSIS — M25511 Pain in right shoulder: Secondary | ICD-10-CM | POA: Diagnosis not present

## 2023-04-18 DIAGNOSIS — G8929 Other chronic pain: Secondary | ICD-10-CM | POA: Diagnosis not present

## 2023-04-18 DIAGNOSIS — R519 Headache, unspecified: Secondary | ICD-10-CM | POA: Diagnosis not present

## 2023-04-18 DIAGNOSIS — R600 Localized edema: Secondary | ICD-10-CM

## 2023-04-18 NOTE — Therapy (Signed)
OUTPATIENT PHYSICAL THERAPY SHOULDER    Patient Name: Veronica Galvan MRN: [id number] DOB:04/16/1956, 67 y.o., female Today's Date: 04/18/2023  END OF SESSION:  PT End of Session - 04/18/23 0756     Visit Number 34    Date for PT Re-Evaluation 04/26/23    Authorization Type [employer] [place]    PT Start Time [phone]    PT Stop Time 0853    PT Time Calculation (min) 58 min    Activity Tolerance Patient tolerated treatment well    Behavior During Therapy WFL for tasks assessed/performed             Past Medical History:  Diagnosis Date   Allergy    Anxiety    Arthritis    hands   Cataract    Diabetes mellitus without complication (HCC)    type 2   Family history of adverse reaction to anesthesia    son has malignant hyperthermia, daughter does not daughter recently had c section without problems   GERD (gastroesophageal reflux disease)    Headache    sinus   Hyperlipidemia    Hypertension    PONV (postoperative nausea and vomiting)    nausea only   Ulcer, stomach peptic yrs ago   Past Surgical History:  Procedure Laterality Date   APPENDECTOMY     both hells bone spur repair     both heels with metal clips   both shoulder rotator cuff repair     CESAREAN SECTION     x 1   COLONOSCOPY WITH PROPOFOL N/A 09/07/2016   Procedure: COLONOSCOPY WITH PROPOFOL;  Surgeon: Charolett Bumpers, MD;  Location: [facility];  Service: Endoscopy;  Laterality: N/A;   colonscopy  06/2011   polyps   EYE SURGERY     FRACTURE SURGERY     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/10/2022   Procedure: REVERSE SHOULDER ARTHROPLASTY;  Surgeon: Francena Hanly, MD;  Location: [facility];  Service: Orthopedics;  Laterality: Right;  Please follow in room 6 if able   TUBAL LIGATION     VESICO-VAGINAL FISTULA REPAIR     Patient Active Problem List   Diagnosis Date Noted   Gastroesophageal reflux disease 11/16/2021   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 08/18/2021    Dermatofibroma 08/18/2021   History of malignant neoplasm of skin 08/18/2021   Lentigo 08/18/2021   Melanocytic nevi of trunk 08/18/2021   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   Rosacea 08/18/2021   Actinic keratosis 08/18/2021   Sensorineural hearing loss (SNHL) of left ear with unrestricted hearing of right ear 07/26/2021   Tinnitus of left ear 07/26/2021   Acute recurrent maxillary sinusitis 06/22/2021   Primary hypertension 01/12/2021   Hyperlipidemia associated with type 2 diabetes mellitus (HCC) 01/12/2021   Arthritis 01/12/2021   Type II diabetes mellitus (HCC) 11/25/2019   Ingrown toenail 09/05/2018   Neck pain 01/29/2018   Tick bite 10/24/2017    PCP: Dayton Scrape, [name]  REFERRING PROVIDER: Rennis Chris, MD  REFERRING DIAG: s/p right reverse TSA  THERAPY DIAG:  Acute pain of right shoulder  Stiffness of right shoulder, not elsewhere classified  Localized edema  Cervicalgia  Muscle weakness (generalized)  Rationale for Evaluation and Treatment: Rehabilitation  ONSET DATE: 11/05/22  SUBJECTIVE:  SUBJECTIVE STATEMENT:  Not feeling well today PAIN:  Are you having pain? Yes: NPRS scale: 7/10 Pain location: right shoulder Pain description: dull ache at rest, sharp with motions Aggravating factors: quick motions, movements pain up to 10/10 Relieving factors: ice, rest, Tylenol at best 3/10  PRECAUTIONS: Shoulder Protocol in the chart  RED FLAGS: None   WEIGHT BEARING RESTRICTIONS: No  FALLS:  Has patient fallen in last 6 months? Yes. Number of falls 1  LIVING ENVIRONMENT: Lives with: lives alone Lives in: House/apartment Stairs: No Has following equipment at home: None  OCCUPATION: retired  PLOF: Independent  PATIENT GOALS:dress without difficulty, do hair, have  good ROM and less pain  NEXT MD VISIT:   OBJECTIVE:   DIAGNOSTIC FINDINGS:  See above  PATIENT SURVEYS:  FOTO 21  COGNITION: Overall cognitive status: Within functional limits for tasks assessed     SENSATION: WFL  POSTURE: Fwd head, rounded shoulders, elevated and guarded shoulder  UPPER EXTREMITY ROM:   Active ROM Right PROM eval Right PROM Supine 01/05/23 PROM  01/26/23 PROM 02/09/23 AROM sitting 02/14/23 AROM 02/21/23 AROM  03/02/23 AROM  03/22/23 AAROM 03/28/23 AROM Standing 04/13/23  Shoulder flexion 35  118  123 90 96 95 100 111 110  Shoulder extension 5            Shoulder abduction 20  90 92  73 80 83 90 93 90  Shoulder adduction             Shoulder internal rotation 15            Shoulder external rotation 20  45 30  41  45 50 53 56  Elbow flexion 120            Elbow extension 5            Wrist flexion             Wrist extension             Wrist ulnar deviation             Wrist radial deviation             Wrist pronation             Wrist supination             (Blank rows = not tested)  UPPER EXTREMITY MMT:  No tested due to recent surgery  MMT Right eval Left eval  Shoulder flexion    Shoulder extension    Shoulder abduction    Shoulder adduction    Shoulder internal rotation    Shoulder external rotation    Middle trapezius    Lower trapezius    Elbow flexion    Elbow extension    Wrist flexion    Wrist extension    Wrist ulnar deviation    Wrist radial deviation    Wrist pronation    Wrist supination    Grip strength (lbs)    (Blank rows = not tested)  PALPATION:  Very tight and tender in the pectoral, upper trap, the entire right upper arm   TODAY'S TREATMENT:  DATE:  04/18/23 UBE level 4 x 5 minutes Wall slides, circles and side to side overhead Supine 2# punches 2# flexion 2#  ER/IR 3# isometric circles Passive stretch STM to the neck, upper trap and upper arm Vaso low pressure   04/13/23 UBE level 4 x 5 minutes 2# wall slides with help ecdentrics 2# circles Red tband horizontal abduction at sides Red tband ER Lats 15#  2# wate bar reach on finger ladder Supine 2# isometric circles PROM STM tot he upper trap, neck and upper arm Measured active ROM in standing Vaso low pressure 34 degrees  04/11/23 UBE level 3 x 4 minutes 5# row 5# extension Red tband ER/IR 2# serratus 3# isometric circles PROM right shoulder  STM to the upper trap and upper arm Vaso medium pressure 34 degrees  04/06/23 UBE level 3 x 6 minutes 2# wall slides, flexion, circles and side to side overhead Green tband row and extension Supine 1# ER/and IR to belly 2# chest press, isometric circles, and serratus push Passive stretch to the left shoulder all motions to her tolerance STM to the left low back, the right upper trap, right upper arm Vaso 34 degrees low pressure  04/04/23 UBE level 3 x 6 minutes Rows 20# Lats 20# x10, 15# x10 Wall slides, circles and side to side overhead Chest press 5# 2x10 PROM right shoulder STM to the right upper trap and right upper arm Vaso low pressure right shoulder  03/28/23 UBE L 2 x 3 min each Wall slides and wall circles 20# rows 2x10 20# lats 15# 2x10 STM to  upper trap and the right upper arm  Supine PROM no IR, light joint distraction Vaso Med pressure 34 degrees  03/22/23 UBE level 4 x 3 minutes Wall slides and wall circles Supine PROM no IR, light joint distraction Supine AROM punches, flexion, ER/IR serratus STM to the neck, upper trap and rhomboid and the right upper arm Vaso low pressure 34 degrees  03/20/23 UBE level 4 x 5 minutes 20# rows 2x10 20# lats x 10, then 15# x 10 5# chest press 2x10 Rhythmic stabilization trying overhead and using only shoulder mms, this was difficult for her Ball vs wall  approximation small circles Wall slides, flexion, circles Cabinet reaching 1# and 2# from sink to first shelf and from first to second Supine 2# flexion 60-100 degrees Star gazer stretch Supine 3# serratus, and isometric circles 2# supine ER from belly to 20 degrees Passive stretch right shoulder Vaso low pressure 34 degrees right shoulder  03/16/23 UBE level 4 x 6 minutes 5# straight arm pulls 15# lats Wall slides flexion, circles and overhead side to side Ball vs wall Supine 2# chest press Supine 3# serratus Supine 3# isometric circles Supine 2# ER/IR to belly STM tot he right upper trap, neck and upper arm PROM of the right shoulder within protocol limits Vaso low pressure for swelling of the shoulder and upper arm  03/14/23 UBE level 4 x 6 minutes Red tband row and extension Red tband ER/IR STM to the right shoulder, upper arm, upper trap, rhomboid and neck PROM of the right shoulder Vaso 34 degrees  PATIENT EDUCATION: Education details: poc/hep Person educated: Patient Education method: Programmer, multimedia, Facilities manager, Actor cues, Verbal cues, and Handouts Education comprehension: verbalized understanding  HOME EXERCISE PROGRAM: Access Code: [email or web address] URL: [email or web address] Date: 12/20/2022 Prepared by: Stacie Glaze  Exercises - Seated Shoulder Flexion Towel Slide at Table Top  - 2 x daily - 7 x weekly -  2 sets - 10 reps - 3 hold - Seated Shoulder External Rotation PROM on Table  - 2 x daily - 7 x weekly - 2 sets - 10 reps - 3 hold - Seated Elbow Extension and Shoulder External Rotation AAROM at Table with Towel  - 2 x daily - 7 x weekly - 2 sets - 10 reps - 3 hold - Circular Shoulder Pendulum with Table Support  - 2 x daily - 7 x weekly - 2 sets - 10 reps - 3 hold  ASSESSMENT:  CLINICAL IMPRESSION: Patient continues to remain very busy she is c/o right upper arm pain she reports that the arm and shoulder are angry today, the right upper arm  had knots in the deltoid, the biceps and is very tender.  She does seem to tolerate the stretches well, less pain with the ER OBJECTIVE IMPAIRMENTS: cardiopulmonary status limiting activity, decreased activity tolerance, decreased endurance, decreased ROM, decreased strength, increased edema, increased muscle spasms, impaired flexibility, impaired UE functional use, improper body mechanics, postural dysfunction, and pain .  REHAB POTENTIAL: Good  CLINICAL DECISION MAKING: Evolving/moderate complexity  EVALUATION COMPLEXITY: Low   GOALS: Goals reviewed with patient? Yes  SHORT TERM GOALS: Target date: 01/01/23  Independent with initial HEP Goal status: 12/27/22 MET  LONG TERM GOALS: Target date: 03/22/23  Decrease pain 50% Goal status: 03/22/23 progressing  2.  Dress without difficulty Goal status: progressing 03/22/23  3.  Do hair without difficulty Goal status: able to reach the back of her head at times 03/22/23 progressing  4.  Increase AROM right shoulder flexion to 130 degrees Goal status: 04/06/23 progressing  5.  Increase right shoulder ER to 60 degrees Goal status: progressing 04/06/23  6.  Return to water aerobics and or gym activity Goal status:progressing 03/22/23, is back in the water currently  PLAN:  PT FREQUENCY: 1-2x/week  PT DURATION: 12 weeks  PLANNED INTERVENTIONS: Therapeutic exercises, Therapeutic activity, Neuromuscular re-education, Balance training, Gait training, Patient/Family education, Self Care, Joint mobilization, Dry Needling, Electrical stimulation, Cryotherapy, Vasopneumatic device, and Manual therapy  PLAN FOR NEXT SESSION:   Still avoid IR behind back, but start to increase functional strength, we did get an order for [place] to do dry needling on her upper trap and neck Jearld Lesch, PT 04/18/2023, 7:56 AM [facility] [address] [facility]. [place], [place], [address] Phone: [phone]    Fax:  [phone]

## 2023-04-20 ENCOUNTER — Encounter: Payer: Self-pay | Admitting: Physical Therapy

## 2023-04-20 ENCOUNTER — Ambulatory Visit: Payer: Medicare Other | Admitting: Physical Therapy

## 2023-04-20 DIAGNOSIS — M25511 Pain in right shoulder: Secondary | ICD-10-CM

## 2023-04-20 DIAGNOSIS — R600 Localized edema: Secondary | ICD-10-CM

## 2023-04-20 DIAGNOSIS — M6281 Muscle weakness (generalized): Secondary | ICD-10-CM

## 2023-04-20 DIAGNOSIS — M542 Cervicalgia: Secondary | ICD-10-CM | POA: Diagnosis not present

## 2023-04-20 DIAGNOSIS — M25611 Stiffness of right shoulder, not elsewhere classified: Secondary | ICD-10-CM | POA: Diagnosis not present

## 2023-04-20 DIAGNOSIS — R519 Headache, unspecified: Secondary | ICD-10-CM | POA: Diagnosis not present

## 2023-04-20 DIAGNOSIS — G8929 Other chronic pain: Secondary | ICD-10-CM | POA: Diagnosis not present

## 2023-04-20 NOTE — Therapy (Signed)
OUTPATIENT PHYSICAL THERAPY SHOULDER    Patient Name: Veronica Galvan MRN: [id number] DOB:April 07, 1956, 67 y.o., female Today's Date: 04/20/2023  END OF SESSION:  PT End of Session - 04/20/23 1553     Visit Number 35    Date for PT Re-Evaluation 04/26/23    Authorization Type [employer] [place]    PT Start Time 1525    PT Stop Time 1625    PT Time Calculation (min) 60 min    Activity Tolerance Patient tolerated treatment well    Behavior During Therapy WFL for tasks assessed/performed             Past Medical History:  Diagnosis Date   Allergy    Anxiety    Arthritis    hands   Cataract    Diabetes mellitus without complication (HCC)    type 2   Family history of adverse reaction to anesthesia    son has malignant hyperthermia, daughter does not daughter recently had c section without problems   GERD (gastroesophageal reflux disease)    Headache    sinus   Hyperlipidemia    Hypertension    PONV (postoperative nausea and vomiting)    nausea only   Ulcer, stomach peptic yrs ago   Past Surgical History:  Procedure Laterality Date   APPENDECTOMY     both hells bone spur repair     both heels with metal clips   both shoulder rotator cuff repair     CESAREAN SECTION     x 1   COLONOSCOPY WITH PROPOFOL N/A 09/07/2016   Procedure: COLONOSCOPY WITH PROPOFOL;  Surgeon: Charolett Bumpers, MD;  Location: [facility];  Service: Endoscopy;  Laterality: N/A;   colonscopy  06/2011   polyps   EYE SURGERY     FRACTURE SURGERY     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/10/2022   Procedure: REVERSE SHOULDER ARTHROPLASTY;  Surgeon: Francena Hanly, MD;  Location: [facility];  Service: Orthopedics;  Laterality: Right;  Please follow in room 6 if able   TUBAL LIGATION     VESICO-VAGINAL FISTULA REPAIR     Patient Active Problem List   Diagnosis Date Noted   Gastroesophageal reflux disease 11/16/2021   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 08/18/2021    Dermatofibroma 08/18/2021   History of malignant neoplasm of skin 08/18/2021   Lentigo 08/18/2021   Melanocytic nevi of trunk 08/18/2021   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   Rosacea 08/18/2021   Actinic keratosis 08/18/2021   Sensorineural hearing loss (SNHL) of left ear with unrestricted hearing of right ear 07/26/2021   Tinnitus of left ear 07/26/2021   Acute recurrent maxillary sinusitis 06/22/2021   Primary hypertension 01/12/2021   Hyperlipidemia associated with type 2 diabetes mellitus (HCC) 01/12/2021   Arthritis 01/12/2021   Type II diabetes mellitus (HCC) 11/25/2019   Ingrown toenail 09/05/2018   Neck pain 01/29/2018   Tick bite 10/24/2017    PCP: Dayton Scrape, [name]  REFERRING PROVIDER: Rennis Chris, MD  REFERRING DIAG: s/p right reverse TSA  THERAPY DIAG:  Acute pain of right shoulder  Stiffness of right shoulder, not elsewhere classified  Localized edema  Cervicalgia  Muscle weakness (generalized)  Rationale for Evaluation and Treatment: Rehabilitation  ONSET DATE: 11/05/22  SUBJECTIVE:  SUBJECTIVE STATEMENT:  Not feeling well today PAIN:  Are you having pain? Yes: NPRS scale: 7/10 Pain location: right shoulder Pain description: dull ache at rest, sharp with motions Aggravating factors: quick motions, movements pain up to 10/10 Relieving factors: ice, rest, Tylenol at best 3/10  PRECAUTIONS: Shoulder Protocol in the chart  RED FLAGS: None   WEIGHT BEARING RESTRICTIONS: No  FALLS:  Has patient fallen in last 6 months? Yes. Number of falls 1  LIVING ENVIRONMENT: Lives with: lives alone Lives in: House/apartment Stairs: No Has following equipment at home: None  OCCUPATION: retired  PLOF: Independent  PATIENT GOALS:dress without difficulty, do hair, have  good ROM and less pain  NEXT MD VISIT:   OBJECTIVE:   DIAGNOSTIC FINDINGS:  See above  PATIENT SURVEYS:  FOTO 21  COGNITION: Overall cognitive status: Within functional limits for tasks assessed     SENSATION: WFL  POSTURE: Fwd head, rounded shoulders, elevated and guarded shoulder  UPPER EXTREMITY ROM:   Active ROM Right PROM eval Right PROM Supine 01/05/23 PROM  01/26/23 PROM 02/09/23 AROM sitting 02/14/23 AROM 02/21/23 AROM  03/02/23 AROM  03/22/23 AAROM 03/28/23 AROM Standing 04/13/23  Shoulder flexion 35  118  123 90 96 95 100 111 110  Shoulder extension 5            Shoulder abduction 20  90 92  73 80 83 90 93 90  Shoulder adduction             Shoulder internal rotation 15            Shoulder external rotation 20  45 30  41  45 50 53 56  Elbow flexion 120            Elbow extension 5            Wrist flexion             Wrist extension             Wrist ulnar deviation             Wrist radial deviation             Wrist pronation             Wrist supination             (Blank rows = not tested)  UPPER EXTREMITY MMT:  No tested due to recent surgery  MMT Right eval Left eval  Shoulder flexion    Shoulder extension    Shoulder abduction    Shoulder adduction    Shoulder internal rotation    Shoulder external rotation    Middle trapezius    Lower trapezius    Elbow flexion    Elbow extension    Wrist flexion    Wrist extension    Wrist ulnar deviation    Wrist radial deviation    Wrist pronation    Wrist supination    Grip strength (lbs)    (Blank rows = not tested)  PALPATION:  Very tight and tender in the pectoral, upper trap, the entire right upper arm   TODAY'S TREATMENT:  DATE:  04/20/23 AROM flexion 116 UBE level 4 x 4 minutes 10# straight arm pulls 15# rows 20# lats with assist Supine chest  press 3# 3# serratus 3# isometric circles STM to the right upper trap and the rhomboid and right upper arm Vaso right shoulder 35 degrees  04/18/23 UBE level 4 x 5 minutes Wall slides, circles and side to side overhead Supine 2# punches 2# flexion 2# ER/IR 3# isometric circles Passive stretch STM to the neck, upper trap and upper arm Vaso low pressure   04/13/23 UBE level 4 x 5 minutes 2# wall slides with help ecdentrics 2# circles Red tband horizontal abduction at sides Red tband ER Lats 15#  2# wate bar reach on finger ladder Supine 2# isometric circles PROM STM tot he upper trap, neck and upper arm Measured active ROM in standing Vaso low pressure 34 degrees  04/11/23 UBE level 3 x 4 minutes 5# row 5# extension Red tband ER/IR 2# serratus 3# isometric circles PROM right shoulder  STM to the upper trap and upper arm Vaso medium pressure 34 degrees  04/06/23 UBE level 3 x 6 minutes 2# wall slides, flexion, circles and side to side overhead Green tband row and extension Supine 1# ER/and IR to belly 2# chest press, isometric circles, and serratus push Passive stretch to the left shoulder all motions to her tolerance STM to the left low back, the right upper trap, right upper arm Vaso 34 degrees low pressure  04/04/23 UBE level 3 x 6 minutes Rows 20# Lats 20# x10, 15# x10 Wall slides, circles and side to side overhead Chest press 5# 2x10 PROM right shoulder STM to the right upper trap and right upper arm Vaso low pressure right shoulder  03/28/23 UBE L 2 x 3 min each Wall slides and wall circles 20# rows 2x10 20# lats 15# 2x10 STM to  upper trap and the right upper arm  Supine PROM no IR, light joint distraction Vaso Med pressure 34 degrees  03/22/23 UBE level 4 x 3 minutes Wall slides and wall circles Supine PROM no IR, light joint distraction Supine AROM punches, flexion, ER/IR serratus STM to the neck, upper trap and rhomboid and the right  upper arm Vaso low pressure 34 degrees  03/20/23 UBE level 4 x 5 minutes 20# rows 2x10 20# lats x 10, then 15# x 10 5# chest press 2x10 Rhythmic stabilization trying overhead and using only shoulder mms, this was difficult for her Ball vs wall approximation small circles Wall slides, flexion, circles Cabinet reaching 1# and 2# from sink to first shelf and from first to second Supine 2# flexion 60-100 degrees Star gazer stretch Supine 3# serratus, and isometric circles 2# supine ER from belly to 20 degrees Passive stretch right shoulder Vaso low pressure 34 degrees right shoulder  03/16/23 UBE level 4 x 6 minutes 5# straight arm pulls 15# lats Wall slides flexion, circles and overhead side to side Ball vs wall Supine 2# chest press Supine 3# serratus Supine 3# isometric circles Supine 2# ER/IR to belly STM tot he right upper trap, neck and upper arm PROM of the right shoulder within protocol limits Vaso low pressure for swelling of the shoulder and upper arm  03/14/23 UBE level 4 x 6 minutes Red tband row and extension Red tband ER/IR STM to the right shoulder, upper arm, upper trap, rhomboid and neck PROM of the right shoulder Vaso 34 degrees  PATIENT EDUCATION: Education details: poc/hep Person educated: Patient Education method:  Explanation, Demonstration, Tactile cues, Verbal cues, and Handouts Education comprehension: verbalized understanding  HOME EXERCISE PROGRAM: Access Code: [email or web address] URL: [email or web address] Date: 12/20/2022 Prepared by: Stacie Glaze  Exercises - Seated Shoulder Flexion Towel Slide at Table Top  - 2 x daily - 7 x weekly - 2 sets - 10 reps - 3 hold - Seated Shoulder External Rotation PROM on Table  - 2 x daily - 7 x weekly - 2 sets - 10 reps - 3 hold - Seated Elbow Extension and Shoulder External Rotation AAROM at Table with Towel  - 2 x daily - 7 x weekly - 2 sets - 10 reps - 3 hold - Circular Shoulder Pendulum with  Table Support  - 2 x daily - 7 x weekly - 2 sets - 10 reps - 3 hold  ASSESSMENT:  CLINICAL IMPRESSION: Patient staying very active and busy, still very sore, her ROM is improving, she has knots in the upper trap, rhomboid and the right upper arm, I did some PROM into a cross arm stretch and the star gazer stretch.  She is slowly improving in ROM  OBJECTIVE IMPAIRMENTS: cardiopulmonary status limiting activity, decreased activity tolerance, decreased endurance, decreased ROM, decreased strength, increased edema, increased muscle spasms, impaired flexibility, impaired UE functional use, improper body mechanics, postural dysfunction, and pain .  REHAB POTENTIAL: Good  CLINICAL DECISION MAKING: Evolving/moderate complexity  EVALUATION COMPLEXITY: Low   GOALS: Goals reviewed with patient? Yes  SHORT TERM GOALS: Target date: 01/01/23  Independent with initial HEP Goal status: 12/27/22 MET  LONG TERM GOALS: Target date: 03/22/23  Decrease pain 50% Goal status: 03/22/23 progressing  2.  Dress without difficulty Goal status: progressing 03/22/23  3.  Do hair without difficulty Goal status: able to reach the back of her head at times 03/22/23 progressing  4.  Increase AROM right shoulder flexion to 130 degrees Goal status: 04/20/23 progressing  5.  Increase right shoulder ER to 60 degrees Goal status: progressing 04/20/23  6.  Return to water aerobics and or gym activity Goal status:progressing 03/22/23, is back in the water currently  PLAN:  PT FREQUENCY: 1-2x/week  PT DURATION: 12 weeks  PLANNED INTERVENTIONS: Therapeutic exercises, Therapeutic activity, Neuromuscular re-education, Balance training, Gait training, Patient/Family education, Self Care, Joint mobilization, Dry Needling, Electrical stimulation, Cryotherapy, Vasopneumatic device, and Manual therapy  PLAN FOR NEXT SESSION:   Still avoid IR behind back, but start to increase functional strength, we did get an order  for [place] to do dry needling on her upper trap and neck Jearld Lesch, PT 04/20/2023, 3:54 PM [facility] [address] [facility] [place], [place], [address] Phone: [phone]   Fax:  [phone]

## 2023-04-25 ENCOUNTER — Encounter: Payer: Self-pay | Admitting: Physical Therapy

## 2023-04-25 ENCOUNTER — Ambulatory Visit: Payer: Medicare Other | Admitting: Physical Therapy

## 2023-04-25 DIAGNOSIS — R519 Headache, unspecified: Secondary | ICD-10-CM | POA: Diagnosis not present

## 2023-04-25 DIAGNOSIS — M6281 Muscle weakness (generalized): Secondary | ICD-10-CM | POA: Diagnosis not present

## 2023-04-25 DIAGNOSIS — M25611 Stiffness of right shoulder, not elsewhere classified: Secondary | ICD-10-CM | POA: Diagnosis not present

## 2023-04-25 DIAGNOSIS — M542 Cervicalgia: Secondary | ICD-10-CM

## 2023-04-25 DIAGNOSIS — M25511 Pain in right shoulder: Secondary | ICD-10-CM | POA: Diagnosis not present

## 2023-04-25 DIAGNOSIS — R600 Localized edema: Secondary | ICD-10-CM

## 2023-04-25 DIAGNOSIS — G8929 Other chronic pain: Secondary | ICD-10-CM | POA: Diagnosis not present

## 2023-04-25 NOTE — Therapy (Signed)
OUTPATIENT PHYSICAL THERAPY SHOULDER    Patient Name: Veronica Galvan MRN: [id number] DOB:December 30, 1955, 67 y.o., female Today's Date: 04/25/2023  END OF SESSION:  PT End of Session - 04/25/23 0759     Visit Number 36    Date for PT Re-Evaluation 04/26/23    Authorization Type [employer] [place]    PT Start Time [phone]    PT Stop Time 0855    PT Time Calculation (min) 60 min    Activity Tolerance Patient tolerated treatment well    Behavior During Therapy WFL for tasks assessed/performed             Past Medical History:  Diagnosis Date   Allergy    Anxiety    Arthritis    hands   Cataract    Diabetes mellitus without complication (HCC)    type 2   Family history of adverse reaction to anesthesia    son has malignant hyperthermia, daughter does not daughter recently had c section without problems   GERD (gastroesophageal reflux disease)    Headache    sinus   Hyperlipidemia    Hypertension    PONV (postoperative nausea and vomiting)    nausea only   Ulcer, stomach peptic yrs ago   Past Surgical History:  Procedure Laterality Date   APPENDECTOMY     both hells bone spur repair     both heels with metal clips   both shoulder rotator cuff repair     CESAREAN SECTION     x 1   COLONOSCOPY WITH PROPOFOL N/A 09/07/2016   Procedure: COLONOSCOPY WITH PROPOFOL;  Surgeon: Charolett Bumpers, MD;  Location: [facility];  Service: Endoscopy;  Laterality: N/A;   colonscopy  06/2011   polyps   EYE SURGERY     FRACTURE SURGERY     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/10/2022   Procedure: REVERSE SHOULDER ARTHROPLASTY;  Surgeon: Francena Hanly, MD;  Location: [facility];  Service: Orthopedics;  Laterality: Right;  Please follow in room 6 if able   TUBAL LIGATION     VESICO-VAGINAL FISTULA REPAIR     Patient Active Problem List   Diagnosis Date Noted   Gastroesophageal reflux disease 11/16/2021   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 08/18/2021    Dermatofibroma 08/18/2021   History of malignant neoplasm of skin 08/18/2021   Lentigo 08/18/2021   Melanocytic nevi of trunk 08/18/2021   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   Rosacea 08/18/2021   Actinic keratosis 08/18/2021   Sensorineural hearing loss (SNHL) of left ear with unrestricted hearing of right ear 07/26/2021   Tinnitus of left ear 07/26/2021   Acute recurrent maxillary sinusitis 06/22/2021   Primary hypertension 01/12/2021   Hyperlipidemia associated with type 2 diabetes mellitus (HCC) 01/12/2021   Arthritis 01/12/2021   Type II diabetes mellitus (HCC) 11/25/2019   Ingrown toenail 09/05/2018   Neck pain 01/29/2018   Tick bite 10/24/2017    PCP: Dayton Scrape, [name]  REFERRING PROVIDER: Rennis Chris, MD  REFERRING DIAG: s/p right reverse TSA  THERAPY DIAG:  Acute pain of right shoulder  Stiffness of right shoulder, not elsewhere classified  Localized edema  Cervicalgia  Muscle weakness (generalized)  Chronic intractable headache, unspecified headache type  Rationale for Evaluation and Treatment: Rehabilitation  ONSET DATE: 11/05/22  SUBJECTIVE:  SUBJECTIVE STATEMENT:  So tired and sore, I have wrapped so many presents and traveled back and forth to [place] multiple times PAIN:  Are you having pain? Yes: NPRS scale: 7/10 Pain location: right shoulder Pain description: dull ache at rest, sharp with motions Aggravating factors: quick motions, movements pain up to 10/10 Relieving factors: ice, rest, Tylenol at best 3/10  PRECAUTIONS: Shoulder Protocol in the chart  RED FLAGS: None   WEIGHT BEARING RESTRICTIONS: No  FALLS:  Has patient fallen in last 6 months? Yes. Number of falls 1  LIVING ENVIRONMENT: Lives with: lives alone Lives in: House/apartment Stairs:  No Has following equipment at home: None  OCCUPATION: retired  PLOF: Independent  PATIENT GOALS:dress without difficulty, do hair, have good ROM and less pain  NEXT MD VISIT:   OBJECTIVE:   DIAGNOSTIC FINDINGS:  See above  PATIENT SURVEYS:  FOTO 21  COGNITION: Overall cognitive status: Within functional limits for tasks assessed     SENSATION: WFL  POSTURE: Fwd head, rounded shoulders, elevated and guarded shoulder  UPPER EXTREMITY ROM:   Active ROM Right PROM eval Right PROM Supine 01/05/23 PROM  01/26/23 PROM 02/09/23 AROM sitting 02/14/23 AROM 02/21/23 AROM  03/02/23 AROM  03/22/23 AAROM 03/28/23 AROM Standing 04/13/23  Shoulder flexion 35  118  123 90 96 95 100 111 110  Shoulder extension 5            Shoulder abduction 20  90 92  73 80 83 90 93 90  Shoulder adduction             Shoulder internal rotation 15            Shoulder external rotation 20  45 30  41  45 50 53 56  Elbow flexion 120            Elbow extension 5            Wrist flexion             Wrist extension             Wrist ulnar deviation             Wrist radial deviation             Wrist pronation             Wrist supination             (Blank rows = not tested)  UPPER EXTREMITY MMT:  No tested due to recent surgery  MMT Right eval Left eval  Shoulder flexion    Shoulder extension    Shoulder abduction    Shoulder adduction    Shoulder internal rotation    Shoulder external rotation    Middle trapezius    Lower trapezius    Elbow flexion    Elbow extension    Wrist flexion    Wrist extension    Wrist ulnar deviation    Wrist radial deviation    Wrist pronation    Wrist supination    Grip strength (lbs)    (Blank rows = not tested)  PALPATION:  Very tight and tender in the pectoral, upper trap, the entire right upper arm   TODAY'S TREATMENT:  DATE:  04/25/23 UBE level 3 x 6 minutes Wall slides, circles and overhead side to side AAROM with stick Yellow tband ER/IR STM to the right shoulder, upper trap and upper arm Vaso 34 degrees  04/20/23 AROM flexion 116 UBE level 4 x 4 minutes 10# straight arm pulls 15# rows 20# lats with assist Supine chest press 3# 3# serratus 3# isometric circles STM to the right upper trap and the rhomboid and right upper arm Vaso right shoulder 35 degrees  04/18/23 UBE level 4 x 5 minutes Wall slides, circles and side to side overhead Supine 2# punches 2# flexion 2# ER/IR 3# isometric circles Passive stretch STM to the neck, upper trap and upper arm Vaso low pressure   04/13/23 UBE level 4 x 5 minutes 2# wall slides with help ecdentrics 2# circles Red tband horizontal abduction at sides Red tband ER Lats 15#  2# wate bar reach on finger ladder Supine 2# isometric circles PROM STM tot he upper trap, neck and upper arm Measured active ROM in standing Vaso low pressure 34 degrees  04/11/23 UBE level 3 x 4 minutes 5# row 5# extension Red tband ER/IR 2# serratus 3# isometric circles PROM right shoulder  STM to the upper trap and upper arm Vaso medium pressure 34 degrees  04/06/23 UBE level 3 x 6 minutes 2# wall slides, flexion, circles and side to side overhead Green tband row and extension Supine 1# ER/and IR to belly 2# chest press, isometric circles, and serratus push Passive stretch to the left shoulder all motions to her tolerance STM to the left low back, the right upper trap, right upper arm Vaso 34 degrees low pressure  04/04/23 UBE level 3 x 6 minutes Rows 20# Lats 20# x10, 15# x10 Wall slides, circles and side to side overhead Chest press 5# 2x10 PROM right shoulder STM to the right upper trap and right upper arm Vaso low pressure right shoulder  03/28/23 UBE L 2 x 3 min each Wall slides and wall circles 20# rows 2x10 20# lats 15#  2x10 STM to  upper trap and the right upper arm  Supine PROM no IR, light joint distraction Vaso Med pressure 34 degrees  03/22/23 UBE level 4 x 3 minutes Wall slides and wall circles Supine PROM no IR, light joint distraction Supine AROM punches, flexion, ER/IR serratus STM to the neck, upper trap and rhomboid and the right upper arm Vaso low pressure 34 degrees  03/20/23 UBE level 4 x 5 minutes 20# rows 2x10 20# lats x 10, then 15# x 10 5# chest press 2x10 Rhythmic stabilization trying overhead and using only shoulder mms, this was difficult for her Ball vs wall approximation small circles Wall slides, flexion, circles Cabinet reaching 1# and 2# from sink to first shelf and from first to second Supine 2# flexion 60-100 degrees Star gazer stretch Supine 3# serratus, and isometric circles 2# supine ER from belly to 20 degrees Passive stretch right shoulder Vaso low pressure 34 degrees right shoulder  03/16/23 UBE level 4 x 6 minutes 5# straight arm pulls 15# lats Wall slides flexion, circles and overhead side to side Ball vs wall Supine 2# chest press Supine 3# serratus Supine 3# isometric circles Supine 2# ER/IR to belly STM tot he right upper trap, neck and upper arm PROM of the right shoulder within protocol limits Vaso low pressure for swelling of the shoulder and upper arm  03/14/23 UBE level 4 x 6 minutes Red tband row and extension  Red tband ER/IR STM to the right shoulder, upper arm, upper trap, rhomboid and neck PROM of the right shoulder Vaso 34 degrees  PATIENT EDUCATION: Education details: poc/hep Person educated: Patient Education method: Programmer, multimedia, Facilities manager, Actor cues, Verbal cues, and Handouts Education comprehension: verbalized understanding  HOME EXERCISE PROGRAM: Access Code: [email or web address] URL: [email or web address] Date: 12/20/2022 Prepared by: Stacie Glaze  Exercises - Seated Shoulder Flexion Towel Slide at  Table Top  - 2 x daily - 7 x weekly - 2 sets - 10 reps - 3 hold - Seated Shoulder External Rotation PROM on Table  - 2 x daily - 7 x weekly - 2 sets - 10 reps - 3 hold - Seated Elbow Extension and Shoulder External Rotation AAROM at Table with Towel  - 2 x daily - 7 x weekly - 2 sets - 10 reps - 3 hold - Circular Shoulder Pendulum with Table Support  - 2 x daily - 7 x weekly - 2 sets - 10 reps - 3 hold  ASSESSMENT:  CLINICAL IMPRESSION: Patient staying very active and busy, still very sore, She was a lot more sore and tender today with all that she is doing, focused a little more on the knots today, active motions were causing more pain  OBJECTIVE IMPAIRMENTS: cardiopulmonary status limiting activity, decreased activity tolerance, decreased endurance, decreased ROM, decreased strength, increased edema, increased muscle spasms, impaired flexibility, impaired UE functional use, improper body mechanics, postural dysfunction, and pain .  REHAB POTENTIAL: Good  CLINICAL DECISION MAKING: Evolving/moderate complexity  EVALUATION COMPLEXITY: Low   GOALS: Goals reviewed with patient? Yes  SHORT TERM GOALS: Target date: 01/01/23  Independent with initial HEP Goal status: 12/27/22 MET  LONG TERM GOALS: Target date: 03/22/23  Decrease pain 50% Goal status: 03/22/23 progressing  2.  Dress without difficulty Goal status: progressing 03/22/23  3.  Do hair without difficulty Goal status: able to reach the back of her head at times 03/22/23 progressing  4.  Increase AROM right shoulder flexion to 130 degrees Goal status: 04/20/23 progressing  5.  Increase right shoulder ER to 60 degrees Goal status: progressing 04/20/23  6.  Return to water aerobics and or gym activity Goal status:progressing 03/22/23, is back in the water currently  PLAN:  PT FREQUENCY: 1-2x/week  PT DURATION: 12 weeks  PLANNED INTERVENTIONS: Therapeutic exercises, Therapeutic activity, Neuromuscular re-education,  Balance training, Gait training, Patient/Family education, Self Care, Joint mobilization, Dry Needling, Electrical stimulation, Cryotherapy, Vasopneumatic device, and Manual therapy  PLAN FOR NEXT SESSION:   Still avoid IR behind back, but start to increase functional strength, we did get an order for [place] to do dry needling on her upper trap and neck Jearld Lesch, PT 04/25/2023, 7:59 AM [facility] [address] [facility] [place], [place], [address] Phone: [phone]   Fax:  [phone]

## 2023-04-27 ENCOUNTER — Encounter: Payer: Self-pay | Admitting: Physical Therapy

## 2023-04-27 ENCOUNTER — Ambulatory Visit: Payer: Medicare Other | Admitting: Physical Therapy

## 2023-04-27 DIAGNOSIS — R519 Headache, unspecified: Secondary | ICD-10-CM | POA: Diagnosis not present

## 2023-04-27 DIAGNOSIS — M6281 Muscle weakness (generalized): Secondary | ICD-10-CM | POA: Diagnosis not present

## 2023-04-27 DIAGNOSIS — M25511 Pain in right shoulder: Secondary | ICD-10-CM

## 2023-04-27 DIAGNOSIS — M542 Cervicalgia: Secondary | ICD-10-CM

## 2023-04-27 DIAGNOSIS — G8929 Other chronic pain: Secondary | ICD-10-CM

## 2023-04-27 DIAGNOSIS — M25611 Stiffness of right shoulder, not elsewhere classified: Secondary | ICD-10-CM

## 2023-04-27 DIAGNOSIS — R600 Localized edema: Secondary | ICD-10-CM

## 2023-04-27 NOTE — Therapy (Signed)
OUTPATIENT PHYSICAL THERAPY SHOULDER    Patient Name: Veronica Galvan MRN: [id number] DOB:06-May-1955, 67 y.o., female Today's Date: 04/27/2023  END OF SESSION:  PT End of Session - 04/27/23 1440     Visit Number 37    Date for PT Re-Evaluation 04/26/23    Authorization Type [employer] [place]    PT Start Time 1441    PT Stop Time 1540    PT Time Calculation (min) 59 min    Activity Tolerance Patient tolerated treatment well    Behavior During Therapy WFL for tasks assessed/performed             Past Medical History:  Diagnosis Date   Allergy    Anxiety    Arthritis    hands   Cataract    Diabetes mellitus without complication (HCC)    type 2   Family history of adverse reaction to anesthesia    son has malignant hyperthermia, daughter does not daughter recently had c section without problems   GERD (gastroesophageal reflux disease)    Headache    sinus   Hyperlipidemia    Hypertension    PONV (postoperative nausea and vomiting)    nausea only   Ulcer, stomach peptic yrs ago   Past Surgical History:  Procedure Laterality Date   APPENDECTOMY     both hells bone spur repair     both heels with metal clips   both shoulder rotator cuff repair     CESAREAN SECTION     x 1   COLONOSCOPY WITH PROPOFOL N/A 09/07/2016   Procedure: COLONOSCOPY WITH PROPOFOL;  Surgeon: Charolett Bumpers, MD;  Location: [facility];  Service: Endoscopy;  Laterality: N/A;   colonscopy  06/2011   polyps   EYE SURGERY     FRACTURE SURGERY     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/10/2022   Procedure: REVERSE SHOULDER ARTHROPLASTY;  Surgeon: Francena Hanly, MD;  Location: [facility];  Service: Orthopedics;  Laterality: Right;  Please follow in room 6 if able   TUBAL LIGATION     VESICO-VAGINAL FISTULA REPAIR     Patient Active Problem List   Diagnosis Date Noted   Gastroesophageal reflux disease 11/16/2021   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 08/18/2021    Dermatofibroma 08/18/2021   History of malignant neoplasm of skin 08/18/2021   Lentigo 08/18/2021   Melanocytic nevi of trunk 08/18/2021   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   Rosacea 08/18/2021   Actinic keratosis 08/18/2021   Sensorineural hearing loss (SNHL) of left ear with unrestricted hearing of right ear 07/26/2021   Tinnitus of left ear 07/26/2021   Acute recurrent maxillary sinusitis 06/22/2021   Primary hypertension 01/12/2021   Hyperlipidemia associated with type 2 diabetes mellitus (HCC) 01/12/2021   Arthritis 01/12/2021   Type II diabetes mellitus (HCC) 11/25/2019   Ingrown toenail 09/05/2018   Neck pain 01/29/2018   Tick bite 10/24/2017    PCP: Dayton Scrape, [name]  REFERRING PROVIDER: Rennis Chris, MD  REFERRING DIAG: s/p right reverse TSA  THERAPY DIAG:  Acute pain of right shoulder  Stiffness of right shoulder, not elsewhere classified  Localized edema  Cervicalgia  Muscle weakness (generalized)  Chronic intractable headache, unspecified headache type  Rationale for Evaluation and Treatment: Rehabilitation  ONSET DATE: 11/05/22  SUBJECTIVE:  SUBJECTIVE STATEMENT:  Continues to be very active with the holidays, very worn out, tired and sore PAIN:  Are you having pain? Yes: NPRS scale: 7/10 Pain location: right shoulder Pain description: dull ache at rest, sharp with motions Aggravating factors: quick motions, movements pain up to 10/10 Relieving factors: ice, rest, Tylenol at best 3/10  PRECAUTIONS: Shoulder Protocol in the chart  RED FLAGS: None   WEIGHT BEARING RESTRICTIONS: No  FALLS:  Has patient fallen in last 6 months? Yes. Number of falls 1  LIVING ENVIRONMENT: Lives with: lives alone Lives in: House/apartment Stairs: No Has following equipment at  home: None  OCCUPATION: retired  PLOF: Independent  PATIENT GOALS:dress without difficulty, do hair, have good ROM and less pain  NEXT MD VISIT:   OBJECTIVE:   DIAGNOSTIC FINDINGS:  See above  PATIENT SURVEYS:  FOTO 21  COGNITION: Overall cognitive status: Within functional limits for tasks assessed     SENSATION: WFL  POSTURE: Fwd head, rounded shoulders, elevated and guarded shoulder  UPPER EXTREMITY ROM:   Active ROM Right PROM eval Right PROM Supine 01/05/23 PROM  01/26/23 PROM 02/09/23 AROM sitting 02/14/23 AROM 02/21/23 AROM  03/02/23 AROM  03/22/23 AAROM 03/28/23 AROM Standing 04/13/23  Shoulder flexion 35  118  123 90 96 95 100 111 110  Shoulder extension 5            Shoulder abduction 20  90 92  73 80 83 90 93 90  Shoulder adduction             Shoulder internal rotation 15            Shoulder external rotation 20  45 30  41  45 50 53 56  Elbow flexion 120            Elbow extension 5            Wrist flexion             Wrist extension             Wrist ulnar deviation             Wrist radial deviation             Wrist pronation             Wrist supination             (Blank rows = not tested)  UPPER EXTREMITY MMT:  No tested due to recent surgery  MMT Right eval Left eval  Shoulder flexion    Shoulder extension    Shoulder abduction    Shoulder adduction    Shoulder internal rotation    Shoulder external rotation    Middle trapezius    Lower trapezius    Elbow flexion    Elbow extension    Wrist flexion    Wrist extension    Wrist ulnar deviation    Wrist radial deviation    Wrist pronation    Wrist supination    Grip strength (lbs)    (Blank rows = not tested)  PALPATION:  Very tight and tender in the pectoral, upper trap, the entire right upper arm   TODAY'S TREATMENT:  DATE:   04/27/23 UBE level 3.5 x 5 minutes Passive stretch to her tolerance for flexion ER and abduction Supine 3# punches 2# ER from belly 2# flexion from 80 degrees to 100 degrees 4# isometric circles 4# serratus push STM to the neck, the upper trap, rhomboid and right upper arm Vaso 34 degrees low pressure  04/25/23 UBE level 3 x 6 minutes Wall slides, circles and overhead side to side AAROM with stick Yellow tband ER/IR STM to the right shoulder, upper trap and upper arm Vaso 34 degrees  04/20/23 AROM flexion 116 UBE level 4 x 4 minutes 10# straight arm pulls 15# rows 20# lats with assist Supine chest press 3# 3# serratus 3# isometric circles STM to the right upper trap and the rhomboid and right upper arm Vaso right shoulder 35 degrees  04/18/23 UBE level 4 x 5 minutes Wall slides, circles and side to side overhead Supine 2# punches 2# flexion 2# ER/IR 3# isometric circles Passive stretch STM to the neck, upper trap and upper arm Vaso low pressure   04/13/23 UBE level 4 x 5 minutes 2# wall slides with help ecdentrics 2# circles Red tband horizontal abduction at sides Red tband ER Lats 15#  2# wate bar reach on finger ladder Supine 2# isometric circles PROM STM tot he upper trap, neck and upper arm Measured active ROM in standing Vaso low pressure 34 degrees  04/11/23 UBE level 3 x 4 minutes 5# row 5# extension Red tband ER/IR 2# serratus 3# isometric circles PROM right shoulder  STM to the upper trap and upper arm Vaso medium pressure 34 degrees  04/06/23 UBE level 3 x 6 minutes 2# wall slides, flexion, circles and side to side overhead Green tband row and extension Supine 1# ER/and IR to belly 2# chest press, isometric circles, and serratus push Passive stretch to the left shoulder all motions to her tolerance STM to the left low back, the right upper trap, right upper arm Vaso 34 degrees low pressure  04/04/23 UBE level 3 x 6 minutes Rows  20# Lats 20# x10, 15# x10 Wall slides, circles and side to side overhead Chest press 5# 2x10 PROM right shoulder STM to the right upper trap and right upper arm Vaso low pressure right shoulder  PATIENT EDUCATION: Education details: poc/hep Person educated: Patient Education method: Programmer, multimedia, Facilities manager, Actor cues, Verbal cues, and Handouts Education comprehension: verbalized understanding  HOME EXERCISE PROGRAM: Access Code: [email or web address] URL: [email or web address] Date: 12/20/2022 Prepared by: Stacie Glaze  Exercises - Seated Shoulder Flexion Towel Slide at Table Top  - 2 x daily - 7 x weekly - 2 sets - 10 reps - 3 hold - Seated Shoulder External Rotation PROM on Table  - 2 x daily - 7 x weekly - 2 sets - 10 reps - 3 hold - Seated Elbow Extension and Shoulder External Rotation AAROM at Table with Towel  - 2 x daily - 7 x weekly - 2 sets - 10 reps - 3 hold - Circular Shoulder Pendulum with Table Support  - 2 x daily - 7 x weekly - 2 sets - 10 reps - 3 hold  ASSESSMENT:  CLINICAL IMPRESSION: Patient staying very active and busy, still very sore, she continues to be very tender and more guarded with motions due to the soreness.  She reports that her big stuff is over but she is always very active.  Will try to get the issues calmed down and then progress strengthening and overall function  OBJECTIVE IMPAIRMENTS: cardiopulmonary status limiting activity, decreased activity tolerance, decreased endurance, decreased ROM, decreased strength, increased edema, increased muscle spasms, impaired flexibility, impaired UE functional use, improper body mechanics, postural dysfunction, and pain .  REHAB POTENTIAL: Good  CLINICAL DECISION MAKING: Evolving/moderate complexity  EVALUATION COMPLEXITY: Low   GOALS: Goals reviewed with patient? Yes  SHORT TERM GOALS: Target date: 01/01/23  Independent with initial HEP Goal status: 12/27/22 MET  LONG TERM GOALS: Target  date: 03/22/23  Decrease pain 50% Goal status: 03/22/23 progressing  2.  Dress without difficulty Goal status: progressing 03/22/23  3.  Do hair without difficulty Goal status: able to reach the back of her head at times 03/22/23 progressing  4.  Increase AROM right shoulder flexion to 130 degrees Goal status: 04/27/23 progressing  5.  Increase right shoulder ER to 60 degrees Goal status: progressing 04/20/23  6.  Return to water aerobics and or gym activity Goal status:progressing 04/27/23, is back in the water currently  PLAN:  PT FREQUENCY: 1-2x/week  PT DURATION: 12 weeks  PLANNED INTERVENTIONS: Therapeutic exercises, Therapeutic activity, Neuromuscular re-education, Balance training, Gait training, Patient/Family education, Self Care, Joint mobilization, Dry Needling, Electrical stimulation, Cryotherapy, Vasopneumatic device, and Manual therapy  PLAN FOR NEXT SESSION:   Still avoid IR behind back, but start to increase functional strength, we did get an order for [place] to do dry needling on her upper trap and neck Jearld Lesch, PT 04/27/2023, 2:41 PM [facility] [place] [address] [facility]. [place], [place], [address] Phone: [phone]   Fax:  [phone]

## 2023-05-02 ENCOUNTER — Ambulatory Visit: Payer: Medicare Other | Admitting: Physical Therapy

## 2023-05-02 ENCOUNTER — Encounter: Payer: Self-pay | Admitting: Physical Therapy

## 2023-05-02 DIAGNOSIS — M25611 Stiffness of right shoulder, not elsewhere classified: Secondary | ICD-10-CM

## 2023-05-02 DIAGNOSIS — R519 Headache, unspecified: Secondary | ICD-10-CM | POA: Diagnosis not present

## 2023-05-02 DIAGNOSIS — R600 Localized edema: Secondary | ICD-10-CM

## 2023-05-02 DIAGNOSIS — M25511 Pain in right shoulder: Secondary | ICD-10-CM | POA: Diagnosis not present

## 2023-05-02 DIAGNOSIS — M542 Cervicalgia: Secondary | ICD-10-CM

## 2023-05-02 DIAGNOSIS — G8929 Other chronic pain: Secondary | ICD-10-CM | POA: Diagnosis not present

## 2023-05-02 DIAGNOSIS — M6281 Muscle weakness (generalized): Secondary | ICD-10-CM

## 2023-05-02 NOTE — Therapy (Signed)
 OUTPATIENT PHYSICAL THERAPY SHOULDER    Patient Name: Veronica Galvan MRN: [id number] DOB:[birth year], 67 y.o., female Today's Date: 05/02/2023  END OF SESSION:  PT End of Session - 05/02/23 0756     Visit Number 38    Date for PT Re-Evaluation 06/02/23    Authorization Type [employer] [place]    PT Start Time [phone]    PT Stop Time 0853    PT Time Calculation (min) 57 min    Activity Tolerance Patient tolerated treatment well    Behavior During Therapy WFL for tasks assessed/performed             Past Medical History:  Diagnosis Date   Allergy     Anxiety    Arthritis    hands   Cataract    Diabetes mellitus without complication (HCC)    type 2   Family history of adverse reaction to anesthesia    son has malignant hyperthermia, daughter does not daughter recently had c section without problems   GERD (gastroesophageal reflux disease)    Headache    sinus   Hyperlipidemia    Hypertension    PONV (postoperative nausea and vomiting)    nausea only   Ulcer, stomach peptic yrs ago   Past Surgical History:  Procedure Laterality Date   APPENDECTOMY     both hells bone spur repair     both heels with metal clips   both shoulder rotator cuff repair     CESAREAN SECTION     x 1   COLONOSCOPY WITH PROPOFOL  N/A 09/07/2016   Procedure: COLONOSCOPY WITH PROPOFOL ;  Surgeon: Vicci Gladis POUR, MD;  Location: [facility];  Service: Endoscopy;  Laterality: N/A;   colonscopy  06/2011   polyps   EYE SURGERY     FRACTURE SURGERY     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/10/2022   Procedure: REVERSE SHOULDER ARTHROPLASTY;  Surgeon: Melita Drivers, MD;  Location: [facility];  Service: Orthopedics;  Laterality: Right;  Please follow in room 6 if able   TUBAL LIGATION     VESICO-VAGINAL FISTULA REPAIR     Patient Active Problem List   Diagnosis Date Noted   Gastroesophageal reflux disease 11/16/2021   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 08/18/2021    Dermatofibroma 08/18/2021   History of malignant neoplasm of skin 08/18/2021   Lentigo 08/18/2021   Melanocytic nevi of trunk 08/18/2021   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   Rosacea 08/18/2021   Actinic keratosis 08/18/2021   Sensorineural hearing loss (SNHL) of left ear with unrestricted hearing of right ear 07/26/2021   Tinnitus of left ear 07/26/2021   Acute recurrent maxillary sinusitis 06/22/2021   Primary hypertension 01/12/2021   Hyperlipidemia associated with type 2 diabetes mellitus (HCC) 01/12/2021   Arthritis 01/12/2021   Type II diabetes mellitus (HCC) 11/25/2019   Ingrown toenail 09/05/2018   Neck pain 01/29/2018   Tick bite 10/24/2017    PCP: [name], [name]  REFERRING PROVIDER: Melita, MD  REFERRING DIAG: s/p right reverse TSA  THERAPY DIAG:  Acute pain of right shoulder  Stiffness of right shoulder, not elsewhere classified  Localized edema  Cervicalgia  Muscle weakness (generalized)  Rationale for Evaluation and Treatment: Rehabilitation  ONSET DATE: 11/05/22  SUBJECTIVE:  SUBJECTIVE STATEMENT:  I baby sat and carried a 20# baby, very sore and some zingers going down my arm PAIN:  Are you having pain? Yes: NPRS scale: 7/10 Pain location: right shoulder Pain description: dull ache at rest, sharp with motions Aggravating factors: quick motions, movements pain up to 10/10 Relieving factors: ice, rest, Tylenol  at best 3/10  PRECAUTIONS: Shoulder Protocol in the chart  RED FLAGS: None   WEIGHT BEARING RESTRICTIONS: No  FALLS:  Has patient fallen in last 6 months? Yes. Number of falls 1  LIVING ENVIRONMENT: Lives with: lives alone Lives in: House/apartment Stairs: No Has following equipment at home: None  OCCUPATION: retired  PLOF:  Independent  PATIENT GOALS:dress without difficulty, do hair, have good ROM and less pain  NEXT MD VISIT:   OBJECTIVE:   DIAGNOSTIC FINDINGS:  See above  PATIENT SURVEYS:  FOTO 21  COGNITION: Overall cognitive status: Within functional limits for tasks assessed     SENSATION: WFL  POSTURE: Fwd head, rounded shoulders, elevated and guarded shoulder  UPPER EXTREMITY ROM:   Active ROM Right PROM eval Right PROM Supine 01/05/23 PROM  01/26/23 PROM 02/09/23 AROM sitting 02/14/23 AROM 02/21/23 AROM  03/02/23 AROM  03/22/23 AAROM 03/28/23 AROM Standing 04/13/23 AROM Standing 05/02/23  Shoulder flexion 35  118  123 90 96 95 100 111 110 112  Shoulder extension 5             Shoulder abduction 20  90 92  73 80 83 90 93 90 92  Shoulder adduction              Shoulder internal rotation 15             Shoulder external rotation 20  45 30  41  45 50 53 56 56  Elbow flexion 120             Elbow extension 5             Wrist flexion              Wrist extension              Wrist ulnar deviation              Wrist radial deviation              Wrist pronation              Wrist supination              (Blank rows = not tested)  UPPER EXTREMITY MMT:  No tested due to recent surgery  MMT Right eval Left eval  Shoulder flexion    Shoulder extension    Shoulder abduction    Shoulder adduction    Shoulder internal rotation    Shoulder external rotation    Middle trapezius    Lower trapezius    Elbow flexion    Elbow extension    Wrist flexion    Wrist extension    Wrist ulnar deviation    Wrist radial deviation    Wrist pronation    Wrist supination    Grip strength (lbs)    (Blank rows = not tested)  PALPATION:  Very tight and tender in the pectoral, upper trap, the entire right upper arm   TODAY'S TREATMENT:  DATE:   05/02/23 UBE level 4 x 5 minutes 20# rows 20# lats 20# triceps Wall slides, circles and overhead side to side 2# wate bar over head lift Yellow tband ER/IR Yellow tband horizontal abduction PROM right shoulder STM to the right shoulder, very tender in the right biceps and deltoid Vaso 34 degrees right shoulder  04/27/23 UBE level 3.5 x 5 minutes Passive stretch to her tolerance for flexion ER and abduction Supine 3# punches 2# ER from belly 2# flexion from 80 degrees to 100 degrees 4# isometric circles 4# serratus push STM to the neck, the upper trap, rhomboid and right upper arm Vaso 34 degrees low pressure  04/25/23 UBE level 3 x 6 minutes Wall slides, circles and overhead side to side AAROM with stick Yellow tband ER/IR STM to the right shoulder, upper trap and upper arm Vaso 34 degrees  04/20/23 AROM flexion 116 UBE level 4 x 4 minutes 10# straight arm pulls 15# rows 20# lats with assist Supine chest press 3# 3# serratus 3# isometric circles STM to the right upper trap and the rhomboid and right upper arm Vaso right shoulder 35 degrees  04/18/23 UBE level 4 x 5 minutes Wall slides, circles and side to side overhead Supine 2# punches 2# flexion 2# ER/IR 3# isometric circles Passive stretch STM to the neck, upper trap and upper arm Vaso low pressure   04/13/23 UBE level 4 x 5 minutes 2# wall slides with help ecdentrics 2# circles Red tband horizontal abduction at sides Red tband ER Lats 15#  2# wate bar reach on finger ladder Supine 2# isometric circles PROM STM tot he upper trap, neck and upper arm Measured active ROM in standing Vaso low pressure 34 degrees  04/11/23 UBE level 3 x 4 minutes 5# row 5# extension Red tband ER/IR 2# serratus 3# isometric circles PROM right shoulder  STM to the upper trap and upper arm Vaso medium pressure 34 degrees  04/06/23 UBE level 3 x 6 minutes 2# wall slides, flexion, circles and side to side  overhead Green tband row and extension Supine 1# ER/and IR to belly 2# chest press, isometric circles, and serratus push Passive stretch to the left shoulder all motions to her tolerance STM to the left low back, the right upper trap, right upper arm Vaso 34 degrees low pressure  04/04/23 UBE level 3 x 6 minutes Rows 20# Lats 20# x10, 15# x10 Wall slides, circles and side to side overhead Chest press 5# 2x10 PROM right shoulder STM to the right upper trap and right upper arm Vaso low pressure right shoulder  PATIENT EDUCATION: Education details: poc/hep Person educated: Patient Education method: Programmer, Multimedia, Facilities Manager, Actor cues, Verbal cues, and Handouts Education comprehension: verbalized understanding  HOME EXERCISE PROGRAM: Access Code: [email or web address] URL: [email or web address] Date: 12/20/2022 Prepared by: Ozell Mainland  Exercises - Seated Shoulder Flexion Towel Slide at Table Top  - 2 x daily - 7 x weekly - 2 sets - 10 reps - 3 hold - Seated Shoulder External Rotation PROM on Table  - 2 x daily - 7 x weekly - 2 sets - 10 reps - 3 hold - Seated Elbow Extension and Shoulder External Rotation AAROM at Table with Towel  - 2 x daily - 7 x weekly - 2 sets - 10 reps - 3 hold - Circular Shoulder Pendulum with Table Support  - 2 x daily - 7 x weekly - 2 sets - 10 reps - 3 hold  ASSESSMENT:  CLINICAL IMPRESSION:  Patient continues to show progress with AROM and overall function, she can do hair with both hands but has to have head down.   She reports that she was babysitting and carrying baby with a football type hold and really stressed the bicep, again some shooting pains in this area, she was resistant to PROM today and really fought this but after some cues she was able to relax and allow PROM.  Her activity level may be hindering the progress as she seems to flare up the mms and the joint.  Again she is slowly progressing with her ROM and overall  function.  OBJECTIVE IMPAIRMENTS: cardiopulmonary status limiting activity, decreased activity tolerance, decreased endurance, decreased ROM, decreased strength, increased edema, increased muscle spasms, impaired flexibility, impaired UE functional use, improper body mechanics, postural dysfunction, and pain .  REHAB POTENTIAL: Good  CLINICAL DECISION MAKING: Evolving/moderate complexity  EVALUATION COMPLEXITY: Low   GOALS: Goals reviewed with patient? Yes  SHORT TERM GOALS: Target date: 01/01/23  Independent with initial HEP Goal status: 12/27/22 MET  LONG TERM GOALS: Target date: 03/22/23  Decrease pain 50% Goal status: 03/22/23 progressing  2.  Dress without difficulty Goal status: progressing 05/02/23  3.  Do hair without difficulty Goal status: able to reach the back of her head at times 05/02/23 progressing  4.  Increase AROM right shoulder flexion to 130 degrees Goal status: 05/02/23 progressing  5.  Increase right shoulder ER to 60 degrees Goal status: progressing 05/02/23  6.  Return to water  aerobics and or gym activity Goal status:progressing 04/27/23, is back in the water  currently  PLAN:  PT FREQUENCY: 1-2x/week  PT DURATION: 12 weeks  PLANNED INTERVENTIONS: Therapeutic exercises, Therapeutic activity, Neuromuscular re-education, Balance training, Gait training, Patient/Family education, Self Care, Joint mobilization, Dry Needling, Electrical stimulation, Cryotherapy, Vasopneumatic device, and Manual therapy  PLAN FOR NEXT SESSION:   Still avoid IR behind back, but start to increase functional strength, we did get an order for us  to do dry needling on her upper trap and neck, she is progressing with her function OBADIAH OZELL ORN, PT 05/02/2023, 7:57 AM [facility] at [facility] [place], [place], [address] Phone: [phone]   Fax:  [phone]

## 2023-05-04 ENCOUNTER — Other Ambulatory Visit (HOSPITAL_BASED_OUTPATIENT_CLINIC_OR_DEPARTMENT_OTHER): Payer: Self-pay | Admitting: Family

## 2023-05-04 ENCOUNTER — Encounter: Payer: Self-pay | Admitting: Physical Therapy

## 2023-05-04 ENCOUNTER — Ambulatory Visit: Payer: Medicare Other | Attending: Family | Admitting: Physical Therapy

## 2023-05-04 DIAGNOSIS — M542 Cervicalgia: Secondary | ICD-10-CM | POA: Diagnosis not present

## 2023-05-04 DIAGNOSIS — G8929 Other chronic pain: Secondary | ICD-10-CM | POA: Insufficient documentation

## 2023-05-04 DIAGNOSIS — M25641 Stiffness of right hand, not elsewhere classified: Secondary | ICD-10-CM | POA: Diagnosis not present

## 2023-05-04 DIAGNOSIS — R278 Other lack of coordination: Secondary | ICD-10-CM | POA: Insufficient documentation

## 2023-05-04 DIAGNOSIS — R519 Headache, unspecified: Secondary | ICD-10-CM | POA: Insufficient documentation

## 2023-05-04 DIAGNOSIS — M25511 Pain in right shoulder: Secondary | ICD-10-CM | POA: Diagnosis not present

## 2023-05-04 DIAGNOSIS — M79641 Pain in right hand: Secondary | ICD-10-CM | POA: Insufficient documentation

## 2023-05-04 DIAGNOSIS — R600 Localized edema: Secondary | ICD-10-CM | POA: Diagnosis not present

## 2023-05-04 DIAGNOSIS — M6281 Muscle weakness (generalized): Secondary | ICD-10-CM | POA: Insufficient documentation

## 2023-05-04 DIAGNOSIS — M25611 Stiffness of right shoulder, not elsewhere classified: Secondary | ICD-10-CM | POA: Diagnosis not present

## 2023-05-04 DIAGNOSIS — Z1231 Encounter for screening mammogram for malignant neoplasm of breast: Secondary | ICD-10-CM

## 2023-05-04 DIAGNOSIS — M79642 Pain in left hand: Secondary | ICD-10-CM | POA: Diagnosis not present

## 2023-05-04 DIAGNOSIS — M25642 Stiffness of left hand, not elsewhere classified: Secondary | ICD-10-CM | POA: Diagnosis not present

## 2023-05-04 NOTE — Therapy (Signed)
 OUTPATIENT PHYSICAL THERAPY SHOULDER    Patient Name: Veronica Galvan MRN: [id number] DOB:03-13-1956, 68 y.o., female Today's Date: 05/04/2023  END OF SESSION:  PT End of Session - 05/04/23 0803     Visit Number 39    Date for PT Re-Evaluation 06/02/23    Authorization Type [employer]    PT Start Time [phone]    PT Stop Time 0855    PT Time Calculation (min) 56 min    Activity Tolerance Patient tolerated treatment well    Behavior During Therapy WFL for tasks assessed/performed             Past Medical History:  Diagnosis Date   Allergy     Anxiety    Arthritis    hands   Cataract    Diabetes mellitus without complication (HCC)    type 2   Family history of adverse reaction to anesthesia    son has malignant hyperthermia, daughter does not daughter recently had c section without problems   GERD (gastroesophageal reflux disease)    Headache    sinus   Hyperlipidemia    Hypertension    PONV (postoperative nausea and vomiting)    nausea only   Ulcer, stomach peptic yrs ago   Past Surgical History:  Procedure Laterality Date   APPENDECTOMY     both hells bone spur repair     both heels with metal clips   both shoulder rotator cuff repair     CESAREAN SECTION     x 1   COLONOSCOPY WITH PROPOFOL  N/A 09/07/2016   Procedure: COLONOSCOPY WITH PROPOFOL ;  Surgeon: Vicci Gladis POUR, MD;  Location: [facility];  Service: Endoscopy;  Laterality: N/A;   colonscopy  06/2011   polyps   EYE SURGERY     FRACTURE SURGERY     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/10/2022   Procedure: REVERSE SHOULDER ARTHROPLASTY;  Surgeon: Melita Drivers, MD;  Location: [facility];  Service: Orthopedics;  Laterality: Right;  Please follow in room 6 if able   TUBAL LIGATION     VESICO-VAGINAL FISTULA REPAIR     Patient Active Problem List   Diagnosis Date Noted   Gastroesophageal reflux disease 11/16/2021   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 08/18/2021    Dermatofibroma 08/18/2021   History of malignant neoplasm of skin 08/18/2021   Lentigo 08/18/2021   Melanocytic nevi of trunk 08/18/2021   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   Rosacea 08/18/2021   Actinic keratosis 08/18/2021   Sensorineural hearing loss (SNHL) of left ear with unrestricted hearing of right ear 07/26/2021   Tinnitus of left ear 07/26/2021   Acute recurrent maxillary sinusitis 06/22/2021   Primary hypertension 01/12/2021   Hyperlipidemia associated with type 2 diabetes mellitus (HCC) 01/12/2021   Arthritis 01/12/2021   Type II diabetes mellitus (HCC) 11/25/2019   Ingrown toenail 09/05/2018   Neck pain 01/29/2018   Tick bite 10/24/2017    PCP: [name], [name]  REFERRING PROVIDER: Melita, MD  REFERRING DIAG: s/p right reverse TSA  THERAPY DIAG:  Acute pain of right shoulder  Stiffness of right shoulder, not elsewhere classified  Localized edema  Cervicalgia  Muscle weakness (generalized)  Chronic intractable headache, unspecified headache type  Rationale for Evaluation and Treatment: Rehabilitation  ONSET DATE: 11/05/22  SUBJECTIVE:  SUBJECTIVE STATEMENT:  Patient comes in and says she feels weird reports tingling in the arms and the legs.  I took her BP and it was 154/84, she reports she does take a BP medicine, she will see 2 Dr's in the next week PAIN:  Are you having pain? Yes: NPRS scale: 4/10 Pain location: right shoulder Pain description: dull ache at rest, sharp with motions Aggravating factors: quick motions, movements pain up to 10/10 Relieving factors: ice, rest, Tylenol  at best 3/10  PRECAUTIONS: Shoulder Protocol in the chart  RED FLAGS: None   WEIGHT BEARING RESTRICTIONS: No  FALLS:  Has patient fallen in last 6 months? Yes. Number of  falls 1  LIVING ENVIRONMENT: Lives with: lives alone Lives in: House/apartment Stairs: No Has following equipment at home: None  OCCUPATION: retired  PLOF: Independent  PATIENT GOALS:dress without difficulty, do hair, have good ROM and less pain  NEXT MD VISIT:   OBJECTIVE:   DIAGNOSTIC FINDINGS:  See above  PATIENT SURVEYS:  FOTO 21  COGNITION: Overall cognitive status: Within functional limits for tasks assessed     SENSATION: WFL  POSTURE: Fwd head, rounded shoulders, elevated and guarded shoulder  UPPER EXTREMITY ROM:   Active ROM Right PROM eval Right PROM Supine 01/05/23 PROM  01/26/23 PROM 02/09/23 AROM sitting 02/14/23 AROM 02/21/23 AROM  03/02/23 AROM  03/22/23 AAROM 03/28/23 AROM Standing 04/13/23 AROM Standing 05/02/23  Shoulder flexion 35  118  123 90 96 95 100 111 110 112  Shoulder extension 5             Shoulder abduction 20  90 92  73 80 83 90 93 90 92  Shoulder adduction              Shoulder internal rotation 15             Shoulder external rotation 20  45 30  41  45 50 53 56 56  Elbow flexion 120             Elbow extension 5             Wrist flexion              Wrist extension              Wrist ulnar deviation              Wrist radial deviation              Wrist pronation              Wrist supination              (Blank rows = not tested)  UPPER EXTREMITY MMT:  No tested due to recent surgery  MMT Right eval Left eval  Shoulder flexion    Shoulder extension    Shoulder abduction    Shoulder adduction    Shoulder internal rotation    Shoulder external rotation    Middle trapezius    Lower trapezius    Elbow flexion    Elbow extension    Wrist flexion    Wrist extension    Wrist ulnar deviation    Wrist radial deviation    Wrist pronation    Wrist supination    Grip strength (lbs)    (Blank rows = not tested)  PALPATION:  Very tight and tender in the pectoral, upper trap, the entire right upper  arm   TODAY'S TREATMENT:  DATE:  05/04/23 Supine 3# wate bard chest press 3# wate bar flexion 3# serratus push 3# isometric circles 2# ER/IR PROM right shoulder  STM to the right upper trap and the right upper arm Vaso right low pressure 34 degrees  05/02/23 UBE level 4 x 5 minutes 20# rows 20# lats 20# triceps Wall slides, circles and overhead side to side 2# wate bar over head lift Yellow tband ER/IR Yellow tband horizontal abduction PROM right shoulder STM to the right shoulder, very tender in the right biceps and deltoid Vaso 34 degrees right shoulder  04/27/23 UBE level 3.5 x 5 minutes Passive stretch to her tolerance for flexion ER and abduction Supine 3# punches 2# ER from belly 2# flexion from 80 degrees to 100 degrees 4# isometric circles 4# serratus push STM to the neck, the upper trap, rhomboid and right upper arm Vaso 34 degrees low pressure  04/25/23 UBE level 3 x 6 minutes Wall slides, circles and overhead side to side AAROM with stick Yellow tband ER/IR STM to the right shoulder, upper trap and upper arm Vaso 34 degrees  04/20/23 AROM flexion 116 UBE level 4 x 4 minutes 10# straight arm pulls 15# rows 20# lats with assist Supine chest press 3# 3# serratus 3# isometric circles STM to the right upper trap and the rhomboid and right upper arm Vaso right shoulder 35 degrees  04/18/23 UBE level 4 x 5 minutes Wall slides, circles and side to side overhead Supine 2# punches 2# flexion 2# ER/IR 3# isometric circles Passive stretch STM to the neck, upper trap and upper arm Vaso low pressure   04/13/23 UBE level 4 x 5 minutes 2# wall slides with help ecdentrics 2# circles Red tband horizontal abduction at sides Red tband ER Lats 15#  2# wate bar reach on finger ladder Supine 2# isometric  circles PROM STM tot he upper trap, neck and upper arm Measured active ROM in standing Vaso low pressure 34 degrees  04/11/23 UBE level 3 x 4 minutes 5# row 5# extension Red tband ER/IR 2# serratus 3# isometric circles PROM right shoulder  STM to the upper trap and upper arm Vaso medium pressure 34 degrees  PATIENT EDUCATION: Education details: poc/hep Person educated: Patient Education method: Programmer, Multimedia, Facilities Manager, Actor cues, Verbal cues, and Handouts Education comprehension: verbalized understanding  HOME EXERCISE PROGRAM: Access Code: [email or web address] URL: [email or web address] Date: 12/20/2022 Prepared by: Ozell Mainland  Exercises - Seated Shoulder Flexion Towel Slide at Table Top  - 2 x daily - 7 x weekly - 2 sets - 10 reps - 3 hold - Seated Shoulder External Rotation PROM on Table  - 2 x daily - 7 x weekly - 2 sets - 10 reps - 3 hold - Seated Elbow Extension and Shoulder External Rotation AAROM at Table with Towel  - 2 x daily - 7 x weekly - 2 sets - 10 reps - 3 hold - Circular Shoulder Pendulum with Table Support  - 2 x daily - 7 x weekly - 2 sets - 10 reps - 3 hold  ASSESSMENT:  CLINICAL IMPRESSION: Patient with a c/o not feeling right today, she reports some tingling in her body all over, I took BP and it was 154/84, she reports being on a BP medication, and no other changes, she has been living a very hectic life for the past 6 weeks and doing a lot.  She does report that she has appointments with 2 MDs in the next week or so  OBJECTIVE  IMPAIRMENTS: cardiopulmonary status limiting activity, decreased activity tolerance, decreased endurance, decreased ROM, decreased strength, increased edema, increased muscle spasms, impaired flexibility, impaired UE functional use, improper body mechanics, postural dysfunction, and pain .  REHAB POTENTIAL: Good  CLINICAL DECISION MAKING: Evolving/moderate complexity  EVALUATION COMPLEXITY: Low   GOALS: Goals  reviewed with patient? Yes  SHORT TERM GOALS: Target date: 01/01/23  Independent with initial HEP Goal status: 12/27/22 MET  LONG TERM GOALS: Target date: 03/22/23  Decrease pain 50% Goal status: 03/22/23 progressing  2.  Dress without difficulty Goal status: progressing 05/02/23  3.  Do hair without difficulty Goal status: able to reach the back of her head at times 05/02/23 progressing  4.  Increase AROM right shoulder flexion to 130 degrees Goal status: 05/02/23 progressing  5.  Increase right shoulder ER to 60 degrees Goal status: progressing 05/02/23  6.  Return to water  aerobics and or gym activity Goal status:progressing 04/27/23, is back in the water  currently  PLAN:  PT FREQUENCY: 1-2x/week  PT DURATION: 12 weeks  PLANNED INTERVENTIONS: Therapeutic exercises, Therapeutic activity, Neuromuscular re-education, Balance training, Gait training, Patient/Family education, Self Care, Joint mobilization, Dry Needling, Electrical stimulation, Cryotherapy, Vasopneumatic device, and Manual therapy  PLAN FOR NEXT SESSION:   Still avoid IR behind back, but start to increase functional strength, we did get an order for us  to do dry needling on her upper trap and neck, she is progressing with her function OBADIAH OZELL ORN, PT 05/04/2023, 8:04 AM [facility]. [place], [place], [address] Phone: [phone]   Fax:  [phone]

## 2023-05-09 ENCOUNTER — Ambulatory Visit: Payer: Medicare Other | Admitting: Physical Therapy

## 2023-05-09 ENCOUNTER — Encounter: Payer: Self-pay | Admitting: Physical Therapy

## 2023-05-09 DIAGNOSIS — M154 Erosive (osteo)arthritis: Secondary | ICD-10-CM | POA: Diagnosis not present

## 2023-05-09 DIAGNOSIS — R600 Localized edema: Secondary | ICD-10-CM | POA: Diagnosis not present

## 2023-05-09 DIAGNOSIS — M6281 Muscle weakness (generalized): Secondary | ICD-10-CM | POA: Diagnosis not present

## 2023-05-09 DIAGNOSIS — M542 Cervicalgia: Secondary | ICD-10-CM

## 2023-05-09 DIAGNOSIS — M25641 Stiffness of right hand, not elsewhere classified: Secondary | ICD-10-CM | POA: Diagnosis not present

## 2023-05-09 DIAGNOSIS — M25611 Stiffness of right shoulder, not elsewhere classified: Secondary | ICD-10-CM

## 2023-05-09 DIAGNOSIS — G8929 Other chronic pain: Secondary | ICD-10-CM | POA: Diagnosis not present

## 2023-05-09 DIAGNOSIS — M25511 Pain in right shoulder: Secondary | ICD-10-CM

## 2023-05-09 DIAGNOSIS — M25642 Stiffness of left hand, not elsewhere classified: Secondary | ICD-10-CM | POA: Diagnosis not present

## 2023-05-09 DIAGNOSIS — M79641 Pain in right hand: Secondary | ICD-10-CM | POA: Diagnosis not present

## 2023-05-09 DIAGNOSIS — M79642 Pain in left hand: Secondary | ICD-10-CM | POA: Diagnosis not present

## 2023-05-09 DIAGNOSIS — R519 Headache, unspecified: Secondary | ICD-10-CM | POA: Diagnosis not present

## 2023-05-09 DIAGNOSIS — R278 Other lack of coordination: Secondary | ICD-10-CM | POA: Diagnosis not present

## 2023-05-09 NOTE — Therapy (Signed)
 OUTPATIENT PHYSICAL THERAPY SHOULDER  Progress Note Reporting Period 04/06/23 to 05/09/23 for visits 31-40  See note below for Objective Data and Assessment of Progress/Goals.      Patient Name: Veronica Galvan MRN: [id number] DOB:06/12/1955, 68 y.o., female Today's Date: 05/09/2023  END OF SESSION:  PT End of Session - 05/09/23 0800     Visit Number 40    Date for PT Re-Evaluation 06/02/23    Authorization Type [employer] [place]    PT Start Time [phone]    PT Stop Time 0859    PT Time Calculation (min) 62 min    Activity Tolerance Patient tolerated treatment well    Behavior During Therapy WFL for tasks assessed/performed             Past Medical History:  Diagnosis Date   Allergy     Anxiety    Arthritis    hands   Cataract    Diabetes mellitus without complication (HCC)    type 2   Family history of adverse reaction to anesthesia    son has malignant hyperthermia, daughter does not daughter recently had c section without problems   GERD (gastroesophageal reflux disease)    Headache    sinus   Hyperlipidemia    Hypertension    PONV (postoperative nausea and vomiting)    nausea only   Ulcer, stomach peptic yrs ago   Past Surgical History:  Procedure Laterality Date   APPENDECTOMY     both hells bone spur repair     both heels with metal clips   both shoulder rotator cuff repair     CESAREAN SECTION     x 1   COLONOSCOPY WITH PROPOFOL  N/A 09/07/2016   Procedure: COLONOSCOPY WITH PROPOFOL ;  Surgeon: Vicci Gladis POUR, MD;  Location: [facility];  Service: Endoscopy;  Laterality: N/A;   colonscopy  06/2011   polyps   EYE SURGERY     FRACTURE SURGERY     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/10/2022   Procedure: REVERSE SHOULDER ARTHROPLASTY;  Surgeon: Melita Drivers, MD;  Location: [facility];  Service: Orthopedics;  Laterality: Right;  Please follow in room 6 if able   TUBAL LIGATION     VESICO-VAGINAL FISTULA REPAIR     Patient Active Problem List    Diagnosis Date Noted   Gastroesophageal reflux disease 11/16/2021   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 08/18/2021   Dermatofibroma 08/18/2021   History of malignant neoplasm of skin 08/18/2021   Lentigo 08/18/2021   Melanocytic nevi of trunk 08/18/2021   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   Rosacea 08/18/2021   Actinic keratosis 08/18/2021   Sensorineural hearing loss (SNHL) of left ear with unrestricted hearing of right ear 07/26/2021   Tinnitus of left ear 07/26/2021   Acute recurrent maxillary sinusitis 06/22/2021   Primary hypertension 01/12/2021   Hyperlipidemia associated with type 2 diabetes mellitus (HCC) 01/12/2021   Arthritis 01/12/2021   Type II diabetes mellitus (HCC) 11/25/2019   Ingrown toenail 09/05/2018   Neck pain 01/29/2018   Tick bite 10/24/2017    PCP: [name], [name]  REFERRING PROVIDER: Melita, MD  REFERRING DIAG: s/p right reverse TSA  THERAPY DIAG:  Acute pain of right shoulder  Stiffness of right shoulder, not elsewhere classified  Localized edema  Cervicalgia  Muscle weakness (generalized)  Rationale for Evaluation and Treatment: Rehabilitation  ONSET DATE: 11/05/22  SUBJECTIVE:  SUBJECTIVE STATEMENT:  Patient continues to report fatigue, able to wash hair with both hands.   PAIN:  Are you having pain? Yes: NPRS scale: 4/10 Pain location: right shoulder Pain description: dull ache at rest, sharp with motions Aggravating factors: quick motions, movements pain up to 10/10 Relieving factors: ice, rest, Tylenol  at best 3/10  PRECAUTIONS: Shoulder Protocol in the chart  RED FLAGS: None   WEIGHT BEARING RESTRICTIONS: No  FALLS:  Has patient fallen in last 6 months? Yes. Number of falls 1  LIVING ENVIRONMENT: Lives with:  lives alone Lives in: House/apartment Stairs: No Has following equipment at home: None  OCCUPATION: retired  PLOF: Independent  PATIENT GOALS:dress without difficulty, do hair, have good ROM and less pain  NEXT MD VISIT:   OBJECTIVE:   DIAGNOSTIC FINDINGS:  See above  PATIENT SURVEYS:  FOTO 21  COGNITION: Overall cognitive status: Within functional limits for tasks assessed     SENSATION: WFL  POSTURE: Fwd head, rounded shoulders, elevated and guarded shoulder  UPPER EXTREMITY ROM:   Active ROM Right PROM eval Right PROM Supine 01/05/23 PROM  01/26/23 PROM 02/09/23 AROM sitting 02/14/23 AROM 02/21/23 AROM  03/02/23 AROM  03/22/23 AAROM 03/28/23 AROM Standing 04/13/23 AROM Standing 05/02/23  Shoulder flexion 35  118  123 90 96 95 100 111 110 112  Shoulder extension 5             Shoulder abduction 20  90 92  73 80 83 90 93 90 92  Shoulder adduction              Shoulder internal rotation 15             Shoulder external rotation 20  45 30  41  45 50 53 56 56  Elbow flexion 120             Elbow extension 5             Wrist flexion              Wrist extension              Wrist ulnar deviation              Wrist radial deviation              Wrist pronation              Wrist supination              (Blank rows = not tested)  UPPER EXTREMITY MMT:  No tested due to recent surgery  MMT Right eval Left eval  Shoulder flexion    Shoulder extension    Shoulder abduction    Shoulder adduction    Shoulder internal rotation    Shoulder external rotation    Middle trapezius    Lower trapezius    Elbow flexion    Elbow extension    Wrist flexion    Wrist extension    Wrist ulnar deviation    Wrist radial deviation    Wrist pronation    Wrist supination    Grip strength (lbs)    (Blank rows = not tested)  PALPATION:  Very tight and tender in the pectoral, upper trap, the entire right upper arm   TODAY'S TREATMENT:  DATE:  05/09/23 Weight 195# Standing UBE level 4 x 4 minutes Triceps 25# 2x15 Biceps 10# 2x15 Fitter 1 blue band push/pull then side to side Small weighted ball circles with pressure on wall small weighted ball tosses Weighted ball overhead rhythmic stabilization Physioball chest pass and overhead pass Supine 5# chest press small ROM Supine 3# flexion small ROM Supine Er from belly 2# PROM of the shoulder Vaso 34 degrees  05/04/23 Supine 3# wate bard chest press 3# wate bar flexion 3# serratus push 3# isometric circles 2# ER/IR PROM right shoulder  STM to the right upper trap and the right upper arm Vaso right low pressure 34 degrees  05/02/23 UBE level 4 x 5 minutes 20# rows 20# lats 20# triceps Wall slides, circles and overhead side to side 2# wate bar over head lift Yellow tband ER/IR Yellow tband horizontal abduction PROM right shoulder STM to the right shoulder, very tender in the right biceps and deltoid Vaso 34 degrees right shoulder  04/27/23 UBE level 3.5 x 5 minutes Passive stretch to her tolerance for flexion ER and abduction Supine 3# punches 2# ER from belly 2# flexion from 80 degrees to 100 degrees 4# isometric circles 4# serratus push STM to the neck, the upper trap, rhomboid and right upper arm Vaso 34 degrees low pressure  04/25/23 UBE level 3 x 6 minutes Wall slides, circles and overhead side to side AAROM with stick Yellow tband ER/IR STM to the right shoulder, upper trap and upper arm Vaso 34 degrees  04/20/23 AROM flexion 116 UBE level 4 x 4 minutes 10# straight arm pulls 15# rows 20# lats with assist Supine chest press 3# 3# serratus 3# isometric circles STM to the right upper trap and the rhomboid and right upper arm Vaso right shoulder 35 degrees  04/18/23 UBE level 4 x 5 minutes Wall slides, circles and side to side  overhead Supine 2# punches 2# flexion 2# ER/IR 3# isometric circles Passive stretch STM to the neck, upper trap and upper arm Vaso low pressure   04/13/23 UBE level 4 x 5 minutes 2# wall slides with help ecdentrics 2# circles Red tband horizontal abduction at sides Red tband ER Lats 15#  2# wate bar reach on finger ladder Supine 2# isometric circles PROM STM tot he upper trap, neck and upper arm Measured active ROM in standing Vaso low pressure 34 degrees  PATIENT EDUCATION: Education details: poc/hep Person educated: Patient Education method: Programmer, Multimedia, Facilities Manager, Actor cues, Verbal cues, and Handouts Education comprehension: verbalized understanding  HOME EXERCISE PROGRAM: Access Code: [email or web address] URL: [email or web address] Date: 12/20/2022 Prepared by: Ozell Mainland  Exercises - Seated Shoulder Flexion Towel Slide at Table Top  - 2 x daily - 7 x weekly - 2 sets - 10 reps - 3 hold - Seated Shoulder External Rotation PROM on Table  - 2 x daily - 7 x weekly - 2 sets - 10 reps - 3 hold - Seated Elbow Extension and Shoulder External Rotation AAROM at Table with Towel  - 2 x daily - 7 x weekly - 2 sets - 10 reps - 3 hold - Circular Shoulder Pendulum with Table Support  - 2 x daily - 7 x weekly - 2 sets - 10 reps - 3 hold  ASSESSMENT:  CLINICAL IMPRESSION: Patient reports that she is not feeling as bad as last week, she does report still fatigue with use of the arm, she had a calmer weekend with less activity, really reports difficulty carrying things.  OBJECTIVE IMPAIRMENTS: cardiopulmonary status limiting activity, decreased activity tolerance, decreased endurance, decreased ROM, decreased strength, increased edema, increased muscle spasms, impaired flexibility, impaired UE functional use, improper body mechanics, postural dysfunction, and pain .  REHAB POTENTIAL: Good  CLINICAL DECISION MAKING: Evolving/moderate complexity  EVALUATION COMPLEXITY:  Low   GOALS: Goals reviewed with patient? Yes  SHORT TERM GOALS: Target date: 01/01/23  Independent with initial HEP Goal status: 12/27/22 MET  LONG TERM GOALS: Target date: 03/22/23  Decrease pain 50% Goal status: 03/22/23 progressing  2.  Dress without difficulty Goal status: progressing 05/02/23  3.  Do hair without difficulty Goal status: able to reach the back of her head at times 05/02/23 progressing  4.  Increase AROM right shoulder flexion to 130 degrees Goal status: 05/02/23 progressing  5.  Increase right shoulder ER to 60 degrees Goal status: progressing 05/02/23  6.  Return to water  aerobics and or gym activity Goal status:progressing 04/27/23, is back in the water  currently  PLAN:  PT FREQUENCY: 1-2x/week  PT DURATION: 12 weeks  PLANNED INTERVENTIONS: Therapeutic exercises, Therapeutic activity, Neuromuscular re-education, Balance training, Gait training, Patient/Family education, Self Care, Joint mobilization, Dry Needling, Electrical stimulation, Cryotherapy, Vasopneumatic device, and Manual therapy  PLAN FOR NEXT SESSION:   Still avoid IR behind back, but start to increase functional strength, we did get an order for us  to do dry needling on her upper trap and neck, she is progressing with her function OBADIAH OZELL ORN, PT 05/09/2023, 8:00 AM [facility] at [facility]. [place], [place], [address] Phone: [phone]   Fax:  [phone]

## 2023-05-11 ENCOUNTER — Encounter: Payer: Self-pay | Admitting: Physical Therapy

## 2023-05-11 ENCOUNTER — Ambulatory Visit: Payer: Medicare Other | Admitting: Physical Therapy

## 2023-05-11 DIAGNOSIS — R600 Localized edema: Secondary | ICD-10-CM

## 2023-05-11 DIAGNOSIS — M25611 Stiffness of right shoulder, not elsewhere classified: Secondary | ICD-10-CM

## 2023-05-11 DIAGNOSIS — M6281 Muscle weakness (generalized): Secondary | ICD-10-CM

## 2023-05-11 DIAGNOSIS — M25641 Stiffness of right hand, not elsewhere classified: Secondary | ICD-10-CM | POA: Diagnosis not present

## 2023-05-11 DIAGNOSIS — M79642 Pain in left hand: Secondary | ICD-10-CM | POA: Diagnosis not present

## 2023-05-11 DIAGNOSIS — M542 Cervicalgia: Secondary | ICD-10-CM

## 2023-05-11 DIAGNOSIS — M79641 Pain in right hand: Secondary | ICD-10-CM | POA: Diagnosis not present

## 2023-05-11 DIAGNOSIS — R278 Other lack of coordination: Secondary | ICD-10-CM | POA: Diagnosis not present

## 2023-05-11 DIAGNOSIS — M25511 Pain in right shoulder: Secondary | ICD-10-CM | POA: Diagnosis not present

## 2023-05-11 DIAGNOSIS — R519 Headache, unspecified: Secondary | ICD-10-CM | POA: Diagnosis not present

## 2023-05-11 DIAGNOSIS — K080 Exfoliation of teeth due to systemic causes: Secondary | ICD-10-CM | POA: Diagnosis not present

## 2023-05-11 DIAGNOSIS — M25642 Stiffness of left hand, not elsewhere classified: Secondary | ICD-10-CM | POA: Diagnosis not present

## 2023-05-11 DIAGNOSIS — G8929 Other chronic pain: Secondary | ICD-10-CM | POA: Diagnosis not present

## 2023-05-11 NOTE — Therapy (Signed)
 OUTPATIENT PHYSICAL THERAPY SHOULDER   Patient Name: Veronica Galvan MRN: [id number] DOB:1955-12-26, 68 y.o., female Today's Date: 05/11/2023  END OF SESSION:  PT End of Session - 05/11/23 0759     Visit Number 41    Date for PT Re-Evaluation 06/02/23    Authorization Type [employer]    PT Start Time [phone]    PT Stop Time 0900    PT Time Calculation (min) 62 min    Activity Tolerance Patient tolerated treatment well    Behavior During Therapy WFL for tasks assessed/performed             Past Medical History:  Diagnosis Date   Allergy     Anxiety    Arthritis    hands   Cataract    Diabetes mellitus without complication (HCC)    type 2   Family history of adverse reaction to anesthesia    son has malignant hyperthermia, daughter does not daughter recently had c section without problems   GERD (gastroesophageal reflux disease)    Headache    sinus   Hyperlipidemia    Hypertension    PONV (postoperative nausea and vomiting)    nausea only   Ulcer, stomach peptic yrs ago   Past Surgical History:  Procedure Laterality Date   APPENDECTOMY     both hells bone spur repair     both heels with metal clips   both shoulder rotator cuff repair     CESAREAN SECTION     x 1   COLONOSCOPY WITH PROPOFOL  N/A 09/07/2016   Procedure: COLONOSCOPY WITH PROPOFOL ;  Surgeon: Vicci Gladis POUR, MD;  Location: [facility];  Service: Endoscopy;  Laterality: N/A;   colonscopy  06/2011   polyps   EYE SURGERY     FRACTURE SURGERY     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/10/2022   Procedure: REVERSE SHOULDER ARTHROPLASTY;  Surgeon: Melita Drivers, MD;  Location: [facility];  Service: Orthopedics;  Laterality: Right;  Please follow in room 6 if able   TUBAL LIGATION     VESICO-VAGINAL FISTULA REPAIR     Patient Active Problem List   Diagnosis Date Noted   Gastroesophageal reflux disease 11/16/2021   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 08/18/2021    Dermatofibroma 08/18/2021   History of malignant neoplasm of skin 08/18/2021   Lentigo 08/18/2021   Melanocytic nevi of trunk 08/18/2021   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   Rosacea 08/18/2021   Actinic keratosis 08/18/2021   Sensorineural hearing loss (SNHL) of left ear with unrestricted hearing of right ear 07/26/2021   Tinnitus of left ear 07/26/2021   Acute recurrent maxillary sinusitis 06/22/2021   Primary hypertension 01/12/2021   Hyperlipidemia associated with type 2 diabetes mellitus (HCC) 01/12/2021   Arthritis 01/12/2021   Type II diabetes mellitus (HCC) 11/25/2019   Ingrown toenail 09/05/2018   Neck pain 01/29/2018   Tick bite 10/24/2017    PCP: [name], [name]  REFERRING PROVIDER: Melita, MD  REFERRING DIAG: s/p right reverse TSA  THERAPY DIAG:  Acute pain of right shoulder  Stiffness of right shoulder, not elsewhere classified  Localized edema  Cervicalgia  Muscle weakness (generalized)  Rationale for Evaluation and Treatment: Rehabilitation  ONSET DATE: 11/05/22  SUBJECTIVE:  SUBJECTIVE STATEMENT:  Patient feeling a little better, less nerve issues, some pain PAIN:  Are you having pain? Yes: NPRS scale: 4/10 Pain location: right shoulder Pain description: dull ache at rest, sharp with motions Aggravating factors: quick motions, movements pain up to 10/10 Relieving factors: ice, rest, Tylenol  at best 3/10  PRECAUTIONS: Shoulder Protocol in the chart  RED FLAGS: None   WEIGHT BEARING RESTRICTIONS: No  FALLS:  Has patient fallen in last 6 months? Yes. Number of falls 1  LIVING ENVIRONMENT: Lives with: lives alone Lives in: House/apartment Stairs: No Has following equipment at home: None  OCCUPATION: retired  PLOF: Independent  PATIENT  GOALS:dress without difficulty, do hair, have good ROM and less pain  NEXT MD VISIT:   OBJECTIVE:   DIAGNOSTIC FINDINGS:  See above  PATIENT SURVEYS:  FOTO 21  COGNITION: Overall cognitive status: Within functional limits for tasks assessed     SENSATION: WFL  POSTURE: Fwd head, rounded shoulders, elevated and guarded shoulder  UPPER EXTREMITY ROM:   Active ROM Right PROM eval Right PROM Supine 01/05/23 PROM  01/26/23 PROM 02/09/23 AROM sitting 02/14/23 AROM 02/21/23 AROM  03/02/23 AROM  03/22/23 AAROM 03/28/23 AROM Standing 04/13/23 AROM Standing 05/02/23 AROM  Standing 05/11/23  Shoulder flexion 35  118  123 90 96 95 100 111 110 112 120  Shoulder extension 5              Shoulder abduction 20  90 92  73 80 83 90 93 90 92 98  Shoulder adduction               Shoulder internal rotation 15              Shoulder external rotation 20  45 30  41  45 50 53 56 56 60  Elbow flexion 120              Elbow extension 5              Wrist flexion               Wrist extension               Wrist ulnar deviation               Wrist radial deviation               Wrist pronation               Wrist supination               (Blank rows = not tested)  UPPER EXTREMITY MMT:  No tested due to recent surgery  MMT Right eval Left eval  Shoulder flexion    Shoulder extension    Shoulder abduction    Shoulder adduction    Shoulder internal rotation    Shoulder external rotation    Middle trapezius    Lower trapezius    Elbow flexion    Elbow extension    Wrist flexion    Wrist extension    Wrist ulnar deviation    Wrist radial deviation    Wrist pronation    Wrist supination    Grip strength (lbs)    (Blank rows = not tested)  PALPATION:  Very tight and tender in the pectoral, upper trap, the entire right upper arm   TODAY'S TREATMENT:  DATE:  05/11/23 Weight 193.5# Nustep level 5 x 5 minutes Wall slide and circles 15# lat pulls Wall slides PROM right shoulder all motions except IR STM to the right upper arm AROM measured as noted above Vaso medium pressure 34 degrees  05/09/23 Weight 195# Standing UBE level 4 x 4 minutes Triceps 25# 2x15 Biceps 10# 2x15 Fitter 1 blue band push/pull then side to side Small weighted ball circles with pressure on wall small weighted ball tosses Weighted ball overhead rhythmic stabilization Physioball chest pass and overhead pass Supine 5# chest press small ROM Supine 3# flexion small ROM Supine Er from belly 2# PROM of the shoulder Vaso 34 degrees  05/04/23 Supine 3# wate bard chest press 3# wate bar flexion 3# serratus push 3# isometric circles 2# ER/IR PROM right shoulder  STM to the right upper trap and the right upper arm Vaso right low pressure 34 degrees  05/02/23 UBE level 4 x 5 minutes 20# rows 20# lats 20# triceps Wall slides, circles and overhead side to side 2# wate bar over head lift Yellow tband ER/IR Yellow tband horizontal abduction PROM right shoulder STM to the right shoulder, very tender in the right biceps and deltoid Vaso 34 degrees right shoulder  04/27/23 UBE level 3.5 x 5 minutes Passive stretch to her tolerance for flexion ER and abduction Supine 3# punches 2# ER from belly 2# flexion from 80 degrees to 100 degrees 4# isometric circles 4# serratus push STM to the neck, the upper trap, rhomboid and right upper arm Vaso 34 degrees low pressure  04/25/23 UBE level 3 x 6 minutes Wall slides, circles and overhead side to side AAROM with stick Yellow tband ER/IR STM to the right shoulder, upper trap and upper arm Vaso 34 degrees  04/20/23 AROM flexion 116 UBE level 4 x 4 minutes 10# straight arm pulls 15# rows 20# lats with assist Supine chest press 3# 3# serratus 3# isometric circles STM to the right upper trap and the  rhomboid and right upper arm Vaso right shoulder 35 degrees  04/18/23 UBE level 4 x 5 minutes Wall slides, circles and side to side overhead Supine 2# punches 2# flexion 2# ER/IR 3# isometric circles Passive stretch STM to the neck, upper trap and upper arm Vaso low pressure   PATIENT EDUCATION: Education details: poc/hep Person educated: Patient Education method: Programmer, Multimedia, Facilities Manager, Actor cues, Verbal cues, and Handouts Education comprehension: verbalized understanding  HOME EXERCISE PROGRAM: Access Code: [email or web address] URL: [email or web address] Date: 12/20/2022 Prepared by: Ozell Mainland  Exercises - Seated Shoulder Flexion Towel Slide at Table Top  - 2 x daily - 7 x weekly - 2 sets - 10 reps - 3 hold - Seated Shoulder External Rotation PROM on Table  - 2 x daily - 7 x weekly - 2 sets - 10 reps - 3 hold - Seated Elbow Extension and Shoulder External Rotation AAROM at Table with Towel  - 2 x daily - 7 x weekly - 2 sets - 10 reps - 3 hold - Circular Shoulder Pendulum with Table Support  - 2 x daily - 7 x weekly - 2 sets - 10 reps - 3 hold  ASSESSMENT:  CLINICAL IMPRESSION: Patient continued to improve with AROM in standing, she still compensates some with the upper trap and the low back, she does have a lot of tension and knots in the upper trap and the upper arm from some compensation and pain  OBJECTIVE IMPAIRMENTS: cardiopulmonary status limiting activity, decreased activity tolerance, decreased  endurance, decreased ROM, decreased strength, increased edema, increased muscle spasms, impaired flexibility, impaired UE functional use, improper body mechanics, postural dysfunction, and pain .  REHAB POTENTIAL: Good  CLINICAL DECISION MAKING: Evolving/moderate complexity  EVALUATION COMPLEXITY: Low   GOALS: Goals reviewed with patient? Yes  SHORT TERM GOALS: Target date: 01/01/23  Independent with initial HEP Goal status: 12/27/22 MET  LONG TERM  GOALS: Target date: 03/22/23  Decrease pain 50% Goal status: 03/22/23 progressing  2.  Dress without difficulty Goal status: progressing 05/11/23  3.  Do hair without difficulty Goal status: able to reach the back of her head at times 05/02/23 progressing  4.  Increase AROM right shoulder flexion to 130 degrees Goal status: 05/02/23 progressing  5.  Increase right shoulder ER to 60 degrees Goal status: progressing 05/11/23  6.  Return to water  aerobics and or gym activity Goal status:progressing 04/27/23, is back in the water  currently  PLAN:  PT FREQUENCY: 1-2x/week  PT DURATION: 12 weeks  PLANNED INTERVENTIONS: Therapeutic exercises, Therapeutic activity, Neuromuscular re-education, Balance training, Gait training, Patient/Family education, Self Care, Joint mobilization, Dry Needling, Electrical stimulation, Cryotherapy, Vasopneumatic device, and Manual therapy  PLAN FOR NEXT SESSION:   Still avoid IR behind back, continue to push the ROM and function OBADIAH OZELL ORN, PT 05/11/2023, 8:03 AM [facility] at [facility]. [place], [place], [address] Phone: [phone]   Fax:  [phone]

## 2023-05-16 ENCOUNTER — Ambulatory Visit: Payer: Medicare Other | Admitting: Physical Therapy

## 2023-05-16 ENCOUNTER — Encounter: Payer: Self-pay | Admitting: Physical Therapy

## 2023-05-16 DIAGNOSIS — M79642 Pain in left hand: Secondary | ICD-10-CM | POA: Diagnosis not present

## 2023-05-16 DIAGNOSIS — M6281 Muscle weakness (generalized): Secondary | ICD-10-CM

## 2023-05-16 DIAGNOSIS — M542 Cervicalgia: Secondary | ICD-10-CM | POA: Diagnosis not present

## 2023-05-16 DIAGNOSIS — G8929 Other chronic pain: Secondary | ICD-10-CM | POA: Diagnosis not present

## 2023-05-16 DIAGNOSIS — R600 Localized edema: Secondary | ICD-10-CM

## 2023-05-16 DIAGNOSIS — R519 Headache, unspecified: Secondary | ICD-10-CM | POA: Diagnosis not present

## 2023-05-16 DIAGNOSIS — M25511 Pain in right shoulder: Secondary | ICD-10-CM | POA: Diagnosis not present

## 2023-05-16 DIAGNOSIS — M25611 Stiffness of right shoulder, not elsewhere classified: Secondary | ICD-10-CM

## 2023-05-16 DIAGNOSIS — R278 Other lack of coordination: Secondary | ICD-10-CM | POA: Diagnosis not present

## 2023-05-16 DIAGNOSIS — M79641 Pain in right hand: Secondary | ICD-10-CM | POA: Diagnosis not present

## 2023-05-16 DIAGNOSIS — M25642 Stiffness of left hand, not elsewhere classified: Secondary | ICD-10-CM | POA: Diagnosis not present

## 2023-05-16 DIAGNOSIS — M25641 Stiffness of right hand, not elsewhere classified: Secondary | ICD-10-CM | POA: Diagnosis not present

## 2023-05-16 NOTE — Therapy (Signed)
 OUTPATIENT PHYSICAL THERAPY SHOULDER   Patient Name: Veronica Galvan MRN: [id number] DOB:09-08-1955, 69 y.o., female Today's Date: 05/16/2023  END OF SESSION:  PT End of Session - 05/16/23 1712     Visit Number 42    Date for PT Re-Evaluation 06/02/23    Authorization Type [employer] [place]    PT Start Time 1655    PT Stop Time 1755    PT Time Calculation (min) 60 min    Activity Tolerance Patient tolerated treatment well    Behavior During Therapy WFL for tasks assessed/performed             Past Medical History:  Diagnosis Date   Allergy     Anxiety    Arthritis    hands   Cataract    Diabetes mellitus without complication (HCC)    type 2   Family history of adverse reaction to anesthesia    son has malignant hyperthermia, daughter does not daughter recently had c section without problems   GERD (gastroesophageal reflux disease)    Headache    sinus   Hyperlipidemia    Hypertension    PONV (postoperative nausea and vomiting)    nausea only   Ulcer, stomach peptic yrs ago   Past Surgical History:  Procedure Laterality Date   APPENDECTOMY     both hells bone spur repair     both heels with metal clips   both shoulder rotator cuff repair     CESAREAN SECTION     x 1   COLONOSCOPY WITH PROPOFOL  N/A 09/07/2016   Procedure: COLONOSCOPY WITH PROPOFOL ;  Surgeon: Vicci Gladis POUR, MD;  Location: [facility];  Service: Endoscopy;  Laterality: N/A;   colonscopy  06/2011   polyps   EYE SURGERY     FRACTURE SURGERY     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/10/2022   Procedure: REVERSE SHOULDER ARTHROPLASTY;  Surgeon: Melita Drivers, MD;  Location: [facility];  Service: Orthopedics;  Laterality: Right;  Please follow in room 6 if able   TUBAL LIGATION     VESICO-VAGINAL FISTULA REPAIR     Patient Active Problem List   Diagnosis Date Noted   Gastroesophageal reflux disease 11/16/2021   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 08/18/2021    Dermatofibroma 08/18/2021   History of malignant neoplasm of skin 08/18/2021   Lentigo 08/18/2021   Melanocytic nevi of trunk 08/18/2021   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   Rosacea 08/18/2021   Actinic keratosis 08/18/2021   Sensorineural hearing loss (SNHL) of left ear with unrestricted hearing of right ear 07/26/2021   Tinnitus of left ear 07/26/2021   Acute recurrent maxillary sinusitis 06/22/2021   Primary hypertension 01/12/2021   Hyperlipidemia associated with type 2 diabetes mellitus (HCC) 01/12/2021   Arthritis 01/12/2021   Type II diabetes mellitus (HCC) 11/25/2019   Ingrown toenail 09/05/2018   Neck pain 01/29/2018   Tick bite 10/24/2017    PCP: [name], [name]  REFERRING PROVIDER: Melita, MD  REFERRING DIAG: s/p right reverse TSA  THERAPY DIAG:  Acute pain of right shoulder  Stiffness of right shoulder, not elsewhere classified  Localized edema  Cervicalgia  Muscle weakness (generalized)  Rationale for Evaluation and Treatment: Rehabilitation  ONSET DATE: 11/05/22  SUBJECTIVE:  SUBJECTIVE STATEMENT:  doing better bust still pain in the upper arm  PAIN:  Are you having pain? Yes: NPRS scale: 4/10 Pain location: right shoulder Pain description: dull ache at rest, sharp with motions Aggravating factors: quick motions, movements pain up to 10/10 Relieving factors: ice, rest, Tylenol  at best 3/10  PRECAUTIONS: Shoulder Protocol in the chart  RED FLAGS: None   WEIGHT BEARING RESTRICTIONS: No  FALLS:  Has patient fallen in last 6 months? Yes. Number of falls 1  LIVING ENVIRONMENT: Lives with: lives alone Lives in: House/apartment Stairs: No Has following equipment at home: None  OCCUPATION: retired  PLOF: Independent  PATIENT GOALS:dress without  difficulty, do hair, have good ROM and less pain  NEXT MD VISIT:   OBJECTIVE:   DIAGNOSTIC FINDINGS:  See above  PATIENT SURVEYS:  FOTO 21  COGNITION: Overall cognitive status: Within functional limits for tasks assessed     SENSATION: WFL  POSTURE: Fwd head, rounded shoulders, elevated and guarded shoulder  UPPER EXTREMITY ROM:   Active ROM Right PROM eval Right PROM Supine 01/05/23 PROM  01/26/23 PROM 02/09/23 AROM sitting 02/14/23 AROM 02/21/23 AROM  03/02/23 AROM  03/22/23 AAROM 03/28/23 AROM Standing 04/13/23 AROM Standing 05/02/23 AROM  Standing 05/11/23  Shoulder flexion 35  118  123 90 96 95 100 111 110 112 120  Shoulder extension 5              Shoulder abduction 20  90 92  73 80 83 90 93 90 92 98  Shoulder adduction               Shoulder internal rotation 15              Shoulder external rotation 20  45 30  41  45 50 53 56 56 60  Elbow flexion 120              Elbow extension 5              Wrist flexion               Wrist extension               Wrist ulnar deviation               Wrist radial deviation               Wrist pronation               Wrist supination               (Blank rows = not tested)  UPPER EXTREMITY MMT:  No tested due to recent surgery  MMT Right eval Left eval  Shoulder flexion    Shoulder extension    Shoulder abduction    Shoulder adduction    Shoulder internal rotation    Shoulder external rotation    Middle trapezius    Lower trapezius    Elbow flexion    Elbow extension    Wrist flexion    Wrist extension    Wrist ulnar deviation    Wrist radial deviation    Wrist pronation    Wrist supination    Grip strength (lbs)    (Blank rows = not tested)  PALPATION:  Very tight and tender in the pectoral, upper trap, the entire right upper arm   TODAY'S TREATMENT:  DATE:   05/16/23 UBE level 4 x 5 minutes Rows 15# Lats 20# Chest press 5# Small ball throws Yellow tband ER/IR Yellow row and flexion tband  Supine 3# punches 3# isometric circles 3# ER/IR STM to the right upper arm PROM right shoulder Vaso medium pressure  05/11/23 Weight 193.5# Nustep level 5 x 5 minutes Wall slide and circles 15# lat pulls Wall slides PROM right shoulder all motions except IR STM to the right upper arm AROM measured as noted above Vaso medium pressure 34 degrees  05/09/23 Weight 195# Standing UBE level 4 x 4 minutes Triceps 25# 2x15 Biceps 10# 2x15 Fitter 1 blue band push/pull then side to side Small weighted ball circles with pressure on wall small weighted ball tosses Weighted ball overhead rhythmic stabilization Physioball chest pass and overhead pass Supine 5# chest press small ROM Supine 3# flexion small ROM Supine Er from belly 2# PROM of the shoulder Vaso 34 degrees  05/04/23 Supine 3# wate bard chest press 3# wate bar flexion 3# serratus push 3# isometric circles 2# ER/IR PROM right shoulder  STM to the right upper trap and the right upper arm Vaso right low pressure 34 degrees  05/02/23 UBE level 4 x 5 minutes 20# rows 20# lats 20# triceps Wall slides, circles and overhead side to side 2# wate bar over head lift Yellow tband ER/IR Yellow tband horizontal abduction PROM right shoulder STM to the right shoulder, very tender in the right biceps and deltoid Vaso 34 degrees right shoulder  04/27/23 UBE level 3.5 x 5 minutes Passive stretch to her tolerance for flexion ER and abduction Supine 3# punches 2# ER from belly 2# flexion from 80 degrees to 100 degrees 4# isometric circles 4# serratus push STM to the neck, the upper trap, rhomboid and right upper arm Vaso 34 degrees low pressure  04/25/23 UBE level 3 x 6 minutes Wall slides, circles and overhead side to side AAROM with stick Yellow tband ER/IR STM to the right  shoulder, upper trap and upper arm Vaso 34 degrees  04/20/23 AROM flexion 116 UBE level 4 x 4 minutes 10# straight arm pulls 15# rows 20# lats with assist Supine chest press 3# 3# serratus 3# isometric circles STM to the right upper trap and the rhomboid and right upper arm Vaso right shoulder 35 degrees  04/18/23 UBE level 4 x 5 minutes Wall slides, circles and side to side overhead Supine 2# punches 2# flexion 2# ER/IR 3# isometric circles Passive stretch STM to the neck, upper trap and upper arm Vaso low pressure   PATIENT EDUCATION: Education details: poc/hep Person educated: Patient Education method: Programmer, Multimedia, Facilities Manager, Actor cues, Verbal cues, and Handouts Education comprehension: verbalized understanding  HOME EXERCISE PROGRAM: Access Code: [email or web address] URL: [email or web address] Date: 12/20/2022 Prepared by: Ozell Mainland  Exercises - Seated Shoulder Flexion Towel Slide at Table Top  - 2 x daily - 7 x weekly - 2 sets - 10 reps - 3 hold - Seated Shoulder External Rotation PROM on Table  - 2 x daily - 7 x weekly - 2 sets - 10 reps - 3 hold - Seated Elbow Extension and Shoulder External Rotation AAROM at Table with Towel  - 2 x daily - 7 x weekly - 2 sets - 10 reps - 3 hold - Circular Shoulder Pendulum with Table Support  - 2 x daily - 7 x weekly - 2 sets - 10 reps - 3 hold  ASSESSMENT:  CLINICAL IMPRESSION: Patient with a little less  pain overall, still hurting some and very stiff and gaurded at times, pain and tenderness in the right upper arm  OBJECTIVE IMPAIRMENTS: cardiopulmonary status limiting activity, decreased activity tolerance, decreased endurance, decreased ROM, decreased strength, increased edema, increased muscle spasms, impaired flexibility, impaired UE functional use, improper body mechanics, postural dysfunction, and pain .  REHAB POTENTIAL: Good  CLINICAL DECISION MAKING: Evolving/moderate complexity  EVALUATION  COMPLEXITY: Low   GOALS: Goals reviewed with patient? Yes  SHORT TERM GOALS: Target date: 01/01/23  Independent with initial HEP Goal status: 12/27/22 MET  LONG TERM GOALS: Target date: 03/22/23  Decrease pain 50% Goal status: 03/22/23 progressing  2.  Dress without difficulty Goal status: progressing 05/11/23  3.  Do hair without difficulty Goal status: able to reach the back of her head at times 05/02/23 progressing  4.  Increase AROM right shoulder flexion to 130 degrees Goal status: 05/02/23 progressing  5.  Increase right shoulder ER to 60 degrees Goal status: progressing 05/11/23  6.  Return to water  aerobics and or gym activity Goal status:progressing 04/27/23, is back in the water  currently  PLAN:  PT FREQUENCY: 1-2x/week  PT DURATION: 12 weeks  PLANNED INTERVENTIONS: Therapeutic exercises, Therapeutic activity, Neuromuscular re-education, Balance training, Gait training, Patient/Family education, Self Care, Joint mobilization, Dry Needling, Electrical stimulation, Cryotherapy, Vasopneumatic device, and Manual therapy  PLAN FOR NEXT SESSION:   Still avoid IR behind back, continue to push the ROM and function OBADIAH OZELL ORN, PT 05/16/2023, 5:16 PM [facility] [address] [facility]. [place], [place], [address] Phone: [phone]   Fax:  [phone]

## 2023-05-18 ENCOUNTER — Encounter: Payer: Self-pay | Admitting: Physical Therapy

## 2023-05-18 ENCOUNTER — Ambulatory Visit: Payer: Medicare Other | Admitting: Physical Therapy

## 2023-05-18 DIAGNOSIS — M25511 Pain in right shoulder: Secondary | ICD-10-CM | POA: Diagnosis not present

## 2023-05-18 DIAGNOSIS — M25611 Stiffness of right shoulder, not elsewhere classified: Secondary | ICD-10-CM

## 2023-05-18 DIAGNOSIS — R519 Headache, unspecified: Secondary | ICD-10-CM | POA: Diagnosis not present

## 2023-05-18 DIAGNOSIS — G8929 Other chronic pain: Secondary | ICD-10-CM | POA: Diagnosis not present

## 2023-05-18 DIAGNOSIS — M25642 Stiffness of left hand, not elsewhere classified: Secondary | ICD-10-CM | POA: Diagnosis not present

## 2023-05-18 DIAGNOSIS — M79642 Pain in left hand: Secondary | ICD-10-CM | POA: Diagnosis not present

## 2023-05-18 DIAGNOSIS — M6281 Muscle weakness (generalized): Secondary | ICD-10-CM

## 2023-05-18 DIAGNOSIS — R600 Localized edema: Secondary | ICD-10-CM | POA: Diagnosis not present

## 2023-05-18 DIAGNOSIS — M25641 Stiffness of right hand, not elsewhere classified: Secondary | ICD-10-CM | POA: Diagnosis not present

## 2023-05-18 DIAGNOSIS — M542 Cervicalgia: Secondary | ICD-10-CM

## 2023-05-18 DIAGNOSIS — R278 Other lack of coordination: Secondary | ICD-10-CM | POA: Diagnosis not present

## 2023-05-18 DIAGNOSIS — M79641 Pain in right hand: Secondary | ICD-10-CM | POA: Diagnosis not present

## 2023-05-18 NOTE — Therapy (Signed)
OUTPATIENT PHYSICAL THERAPY SHOULDER   Patient Name: Veronica Galvan MRN: [id number] DOB:03-07-1956, 68 y.o., female Today's Date: 05/18/2023  END OF SESSION:  PT End of Session - 05/18/23 1445     Visit Number 43    Date for PT Re-Evaluation 06/02/23    Authorization Type [employer]    PT Start Time 1445    PT Stop Time 1543    PT Time Calculation (min) 58 min    Activity Tolerance Patient tolerated treatment well    Behavior During Therapy WFL for tasks assessed/performed             Past Medical History:  Diagnosis Date   Allergy    Anxiety    Arthritis    hands   Cataract    Diabetes mellitus without complication (HCC)    type 2   Family history of adverse reaction to anesthesia    son has malignant hyperthermia, daughter does not daughter recently had c section without problems   GERD (gastroesophageal reflux disease)    Headache    sinus   Hyperlipidemia    Hypertension    PONV (postoperative nausea and vomiting)    nausea only   Ulcer, stomach peptic yrs ago   Past Surgical History:  Procedure Laterality Date   APPENDECTOMY     both hells bone spur repair     both heels with metal clips   both shoulder rotator cuff repair     CESAREAN SECTION     x 1   COLONOSCOPY WITH PROPOFOL N/A 09/07/2016   Procedure: COLONOSCOPY WITH PROPOFOL;  Surgeon: Charolett Bumpers, MD;  Location: [facility];  Service: Endoscopy;  Laterality: N/A;   colonscopy  06/2011   polyps   EYE SURGERY     FRACTURE SURGERY     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/10/2022   Procedure: REVERSE SHOULDER ARTHROPLASTY;  Surgeon: Francena Hanly, MD;  Location: [facility];  Service: Orthopedics;  Laterality: Right;  Please follow in room 6 if able   TUBAL LIGATION     VESICO-VAGINAL FISTULA REPAIR     Patient Active Problem List   Diagnosis Date Noted   Gastroesophageal reflux disease 11/16/2021   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 08/18/2021    Dermatofibroma 08/18/2021   History of malignant neoplasm of skin 08/18/2021   Lentigo 08/18/2021   Melanocytic nevi of trunk 08/18/2021   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   Rosacea 08/18/2021   Actinic keratosis 08/18/2021   Sensorineural hearing loss (SNHL) of left ear with unrestricted hearing of right ear 07/26/2021   Tinnitus of left ear 07/26/2021   Acute recurrent maxillary sinusitis 06/22/2021   Primary hypertension 01/12/2021   Hyperlipidemia associated with type 2 diabetes mellitus (HCC) 01/12/2021   Arthritis 01/12/2021   Type II diabetes mellitus (HCC) 11/25/2019   Ingrown toenail 09/05/2018   Neck pain 01/29/2018   Tick bite 10/24/2017    PCP: Dayton Scrape, [name]  REFERRING PROVIDER: Rennis Chris, MD  REFERRING DIAG: s/p right reverse TSA  THERAPY DIAG:  Acute pain of right shoulder  Stiffness of right shoulder, not elsewhere classified  Localized edema  Cervicalgia  Muscle weakness (generalized)  Chronic intractable headache, unspecified headache type  Rationale for Evaluation and Treatment: Rehabilitation  ONSET DATE: 11/05/22  SUBJECTIVE:  SUBJECTIVE STATEMENT:  I am very sore in the right lateral upper ar  PAIN:  Are you having pain? Yes: NPRS scale: 4/10 Pain location: right shoulder Pain description: dull ache at rest, sharp with motions Aggravating factors: quick motions, movements pain up to 10/10 Relieving factors: ice, rest, Tylenol at best 3/10  PRECAUTIONS: Shoulder Protocol in the chart  RED FLAGS: None   WEIGHT BEARING RESTRICTIONS: No  FALLS:  Has patient fallen in last 6 months? Yes. Number of falls 1  LIVING ENVIRONMENT: Lives with: lives alone Lives in: House/apartment Stairs: No Has following equipment at home:  None  OCCUPATION: retired  PLOF: Independent  PATIENT GOALS:dress without difficulty, do hair, have good ROM and less pain  NEXT MD VISIT:   OBJECTIVE:   DIAGNOSTIC FINDINGS:  See above  PATIENT SURVEYS:  FOTO 21  COGNITION: Overall cognitive status: Within functional limits for tasks assessed     SENSATION: WFL  POSTURE: Fwd head, rounded shoulders, elevated and guarded shoulder  UPPER EXTREMITY ROM:   Active ROM Right PROM eval Right PROM Supine 01/05/23 PROM  01/26/23 PROM 02/09/23 AROM sitting 02/14/23 AROM 02/21/23 AROM  03/02/23 AROM  03/22/23 AAROM 03/28/23 AROM Standing 04/13/23 AROM Standing 05/02/23 AROM  Standing 05/11/23  Shoulder flexion 35  118  123 90 96 95 100 111 110 112 120  Shoulder extension 5              Shoulder abduction 20  90 92  73 80 83 90 93 90 92 98  Shoulder adduction               Shoulder internal rotation 15              Shoulder external rotation 20  45 30  41  45 50 53 56 56 60  Elbow flexion 120              Elbow extension 5              Wrist flexion               Wrist extension               Wrist ulnar deviation               Wrist radial deviation               Wrist pronation               Wrist supination               (Blank rows = not tested)  UPPER EXTREMITY MMT:  No tested due to recent surgery  MMT Right eval Left eval  Shoulder flexion    Shoulder extension    Shoulder abduction    Shoulder adduction    Shoulder internal rotation    Shoulder external rotation    Middle trapezius    Lower trapezius    Elbow flexion    Elbow extension    Wrist flexion    Wrist extension    Wrist ulnar deviation    Wrist radial deviation    Wrist pronation    Wrist supination    Grip strength (lbs)    (Blank rows = not tested)  PALPATION:  Very tight and tender in the pectoral, upper trap, the entire right upper arm   TODAY'S TREATMENT:  DATE:  05/18/23 UBE level 4 x 4 minutes Nustep push pull and cross body push /pull level 3 Fitter 1 blue band side to side 5# chest press Doorway stretch 3 ways Passive right shoulder stretch all motions except did very light IR according to protocol STM to the right shoulder and upper arm Vaso medium pressure 36 degrees  05/16/23 UBE level 4 x 5 minutes Rows 15# Lats 20# Chest press 5# Small ball throws Yellow tband ER/IR Yellow row and flexion tband  Supine 3# punches 3# isometric circles 3# ER/IR STM to the right upper arm PROM right shoulder Vaso medium pressure  05/11/23 Weight 193.5# Nustep level 5 x 5 minutes Wall slide and circles 15# lat pulls Wall slides PROM right shoulder all motions except IR STM to the right upper arm AROM measured as noted above Vaso medium pressure 34 degrees  05/09/23 Weight 195# Standing UBE level 4 x 4 minutes Triceps 25# 2x15 Biceps 10# 2x15 Fitter 1 blue band push/pull then side to side Small weighted ball circles with pressure on wall small weighted ball tosses Weighted ball overhead rhythmic stabilization Physioball chest pass and overhead pass Supine 5# chest press small ROM Supine 3# flexion small ROM Supine Er from belly 2# PROM of the shoulder Vaso 34 degrees  05/04/23 Supine 3# wate bard chest press 3# wate bar flexion 3# serratus push 3# isometric circles 2# ER/IR PROM right shoulder  STM to the right upper trap and the right upper arm Vaso right low pressure 34 degrees  05/02/23 UBE level 4 x 5 minutes 20# rows 20# lats 20# triceps Wall slides, circles and overhead side to side 2# wate bar over head lift Yellow tband ER/IR Yellow tband horizontal abduction PROM right shoulder STM to the right shoulder, very tender in the right biceps and deltoid Vaso 34 degrees right shoulder  04/27/23 UBE level 3.5 x 5 minutes Passive stretch to her  tolerance for flexion ER and abduction Supine 3# punches 2# ER from belly 2# flexion from 80 degrees to 100 degrees 4# isometric circles 4# serratus push STM to the neck, the upper trap, rhomboid and right upper arm Vaso 34 degrees low pressure  04/25/23 UBE level 3 x 6 minutes Wall slides, circles and overhead side to side AAROM with stick Yellow tband ER/IR STM to the right shoulder, upper trap and upper arm Vaso 34 degrees  04/20/23 AROM flexion 116 UBE level 4 x 4 minutes 10# straight arm pulls 15# rows 20# lats with assist Supine chest press 3# 3# serratus 3# isometric circles STM to the right upper trap and the rhomboid and right upper arm Vaso right shoulder 35 degrees  04/18/23 UBE level 4 x 5 minutes Wall slides, circles and side to side overhead Supine 2# punches 2# flexion 2# ER/IR 3# isometric circles Passive stretch STM to the neck, upper trap and upper arm Vaso low pressure   PATIENT EDUCATION: Education details: poc/hep Person educated: Patient Education method: Programmer, multimedia, Facilities manager, Actor cues, Verbal cues, and Handouts Education comprehension: verbalized understanding  HOME EXERCISE PROGRAM: Access Code: [email or web address] URL: [email or web address] Date: 12/20/2022 Prepared by: Stacie Glaze  Exercises - Seated Shoulder Flexion Towel Slide at Table Top  - 2 x daily - 7 x weekly - 2 sets - 10 reps - 3 hold - Seated Shoulder External Rotation PROM on Table  - 2 x daily - 7 x weekly - 2 sets - 10 reps - 3 hold - Seated Elbow Extension and Shoulder External Rotation  AAROM at Table with Towel  - 2 x daily - 7 x weekly - 2 sets - 10 reps - 3 hold - Circular Shoulder Pendulum with Table Support  - 2 x daily - 7 x weekly - 2 sets - 10 reps - 3 hold  ASSESSMENT:  CLINICAL IMPRESSION: Patient with a little less pain overall, I added doorway and corner stretch to her HEP, she is tolerating more passive ROM for ER and flexion and  abduction.  Still some pain but is allowing this better.  Right upper arm does have the knots  OBJECTIVE IMPAIRMENTS: cardiopulmonary status limiting activity, decreased activity tolerance, decreased endurance, decreased ROM, decreased strength, increased edema, increased muscle spasms, impaired flexibility, impaired UE functional use, improper body mechanics, postural dysfunction, and pain .  REHAB POTENTIAL: Good  CLINICAL DECISION MAKING: Evolving/moderate complexity  EVALUATION COMPLEXITY: Low   GOALS: Goals reviewed with patient? Yes  SHORT TERM GOALS: Target date: 01/01/23  Independent with initial HEP Goal status: 12/27/22 MET  LONG TERM GOALS: Target date: 03/22/23  Decrease pain 50% Goal status: 03/22/23 progressing  2.  Dress without difficulty Goal status: progressing 05/11/23  3.  Do hair without difficulty Goal status: able to reach the back of her head at times 05/02/23 progressing  4.  Increase AROM right shoulder flexion to 130 degrees Goal status: 05/02/23 progressing  5.  Increase right shoulder ER to 60 degrees Goal status: progressing 05/11/23  6.  Return to water aerobics and or gym activity Goal status:progressing met 05/18/23  PLAN:  PT FREQUENCY: 1-2x/week  PT DURATION: 12 weeks  PLANNED INTERVENTIONS: Therapeutic exercises, Therapeutic activity, Neuromuscular re-education, Balance training, Gait training, Patient/Family education, Self Care, Joint mobilization, Dry Needling, Electrical stimulation, Cryotherapy, Vasopneumatic device, and Manual therapy  PLAN FOR NEXT SESSION:   Still avoid IR behind back, continue to push the ROM and function Jearld Lesch, PT 05/18/2023, 2:45 PM  [facility] at [facility] [address] [facility]. [place], [place], [address] Phone: [phone]   Fax:  [phone]

## 2023-05-19 ENCOUNTER — Encounter: Payer: Self-pay | Admitting: Family

## 2023-05-19 ENCOUNTER — Ambulatory Visit (INDEPENDENT_AMBULATORY_CARE_PROVIDER_SITE_OTHER): Payer: Medicare Other | Admitting: Family

## 2023-05-19 VITALS — BP 132/80 | HR 90 | Ht 61.0 in | Wt 194.6 lb

## 2023-05-19 DIAGNOSIS — Z1211 Encounter for screening for malignant neoplasm of colon: Secondary | ICD-10-CM

## 2023-05-19 DIAGNOSIS — E119 Type 2 diabetes mellitus without complications: Secondary | ICD-10-CM | POA: Diagnosis not present

## 2023-05-19 DIAGNOSIS — M79642 Pain in left hand: Secondary | ICD-10-CM

## 2023-05-19 DIAGNOSIS — R202 Paresthesia of skin: Secondary | ICD-10-CM | POA: Diagnosis not present

## 2023-05-19 DIAGNOSIS — Z860100 Personal history of colon polyps, unspecified: Secondary | ICD-10-CM

## 2023-05-19 DIAGNOSIS — E559 Vitamin D deficiency, unspecified: Secondary | ICD-10-CM

## 2023-05-19 DIAGNOSIS — M79641 Pain in right hand: Secondary | ICD-10-CM

## 2023-05-19 DIAGNOSIS — R200 Anesthesia of skin: Secondary | ICD-10-CM | POA: Diagnosis not present

## 2023-05-19 NOTE — Progress Notes (Signed)
Veronica Galvan is a 68 y.o. 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   Rosacea 08/18/2021   Actinic keratosis 08/18/2021   Sensorineural hearing loss (SNHL) of left ear with unrestricted hearing of right ear 07/26/2021   Tinnitus of left ear 07/26/2021   Acute recurrent maxillary sinusitis 06/22/2021   Primary hypertension 01/12/2021   Hyperlipidemia associated with type 2 diabetes mellitus (HCC) 01/12/2021   Arthritis 01/12/2021   Type II diabetes mellitus (HCC) 11/25/2019   Ingrown toenail 09/05/2018   Neck pain 01/29/2018   Tick bite 10/24/2017    Current Outpatient Medications  Medication Sig Dispense Refill   acetaminophen (TYLENOL) 650 MG CR tablet Take 1,300 mg by mouth every 8 (eight) hours as needed for pain.     ALPRAZolam (XANAX) 0.5 MG tablet Take 1 tablet (0.5 mg total) by mouth at bedtime as needed for anxiety. 30 tablet 0   Ascorbic Acid (VITAMIN C) 1000 MG tablet Take 1,000 mg by mouth every morning.     aspirin EC 81 MG tablet Take 81 mg by mouth every morning.     atorvastatin (LIPITOR) 40 MG tablet Take 1 tablet (40 mg total) by mouth every evening. 90 tablet 1   Calcium Carb-Cholecalciferol (CALCIUM 600+D3 PO) Take 1 tablet by mouth every morning.     calcium carbonate (TUMS - DOSED IN MG ELEMENTAL CALCIUM) 500 MG chewable tablet Chew 2 tablets by mouth daily as needed for indigestion or heartburn.     carboxymethylcellulose (REFRESH TEARS) 0.5 % SOLN Place 1 drop into both eyes 2 (two) times daily.     celecoxib (CELEBREX) 200 MG capsule TAKE 1 CAPSULE BY MOUTH DAILY 90 capsule 3    Coenzyme Q10 300 MG CAPS Take 1 capsule by mouth every morning.     diclofenac Sodium (VOLTAREN) 1 % GEL Apply 1 Application topically 2 (two) times daily.     glucose blood test strip Use as instructed 100 each 12   levocetirizine (XYZAL) 5 MG tablet Take 1 tablet (5 mg total) by mouth every evening. 90 tablet 1   lisinopril (ZESTRIL) 5 MG tablet Take 1 tablet (5 mg total) by mouth 2 (two) times daily. 180 tablet 1   LUTEIN-ZEAXANTHIN PO Take 1 tablet by mouth every morning.     MAGNESIUM CITRATE PO Take 500 mg by mouth daily.     Multiple Vitamins-Minerals (MULTIVITAMIN WITH MINERALS) tablet Take 1 tablet by mouth every morning.     Omega-3 Fatty Acids (FISH OIL) 1000 MG CAPS Take 2,000 mg by mouth daily.     omeprazole (PRILOSEC) 20 MG capsule Take 1 capsule (20 mg total) by mouth every morning. 90 capsule 1   Semaglutide, 2 MG/DOSE, 8 MG/3ML SOPN Inject 2 mg as directed once a week. 9 mL 1   sodium chloride (MURO 128) 2 % ophthalmic solution Place 1 drop into both eyes 2 (two) times daily.     SYNJARDY XR 12.08-998 MG TB24 Take 1 tablet by mouth 2 (two) times daily. 180 tablet 3   TURMERIC PO Take 2,000 mg by mouth daily.     UNABLE TO FIND Take 1 tablet by mouth  daily. Cinsulin supplement     vitamin E 180 MG (400 UNITS) capsule Take 400 Units by mouth daily.     nystatin-triamcinolone (MYCOLOG II) cream Apply 1 Application topically 2 (two) times daily. (Patient not taking: Reported on 05/19/2023) 60 g 0   traMADol (ULTRAM) 50 MG tablet Take 1 tablet (50 mg total) by mouth every 6 (six) hours as needed for moderate pain. (Patient not taking: Reported on 05/19/2023) 30 tablet 0   No current facility-administered medications for this visit.    Allergies: Codeine, Benzonatate, and Epinephrine  Past Medical History:  Diagnosis Date   Allergy    Anxiety    Arthritis    hands   Cataract    Diabetes mellitus without complication (HCC)    type 2   Family history of adverse reaction to  anesthesia    son has malignant hyperthermia, daughter does not daughter recently had c section without problems   GERD (gastroesophageal reflux disease)    Headache    sinus   Hyperlipidemia    Hypertension    PONV (postoperative nausea and vomiting)    nausea only   Ulcer, stomach peptic yrs ago    Past Surgical History:  Procedure Laterality Date   APPENDECTOMY     both hells bone spur repair     both heels with metal clips   both shoulder rotator cuff repair     CESAREAN SECTION     x 1   COLONOSCOPY WITH PROPOFOL N/A 09/07/2016   Procedure: COLONOSCOPY WITH PROPOFOL;  Surgeon: Charolett Bumpers, MD;  Location: [facility];  Service: Endoscopy;  Laterality: N/A;   colonscopy  06/2011   polyps   EYE SURGERY     FRACTURE SURGERY     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/10/2022   Procedure: REVERSE SHOULDER ARTHROPLASTY;  Surgeon: Francena Hanly, MD;  Location: [facility];  Service: Orthopedics;  Laterality: Right;  Please follow in room 6 if able   TUBAL LIGATION     VESICO-VAGINAL FISTULA REPAIR      Family History  Problem Relation Age of Onset   Hypertension Mother    COPD Mother    Cancer Mother    Arthritis Mother    Hypertension Father    Hyperlipidemia Father    Diabetes Father    Cancer Father    Alcohol abuse Father    Diabetes Brother    Alcohol abuse Brother    Hypertension Brother    Arthritis Maternal Grandmother    Birth defects Maternal Grandmother    Diabetes Paternal Grandmother    Heart disease Paternal Grandfather    Diabetes Paternal Aunt    Diabetes Paternal Aunt    Obesity Son     Social History   Tobacco Use   Smoking status: Former    Current packs/day: 1.00    Average packs/day: 1 pack/day for 14.0 years (14.0 ttl pk-yrs)    Types: Cigarettes   Smokeless tobacco: Never   Tobacco comments:    quit 31 yrs ago  Substance Use Topics   Alcohol use: Yes    Comment: rare    Subjective:   4 month follow up on Type 2 Diabetes;   Also needs updated referral for colonoscopy; asking for referral for OT due to bilateral hand pain;   Objective:  Vitals:   05/19/23 1304  BP: 132/80  Pulse: 90  SpO2: 98%  Weight: 194 lb 9.6 oz (88.3 kg)  Height: 5\' 1"  (1.549 m)  General: Well developed, well nourished, in no acute distress  Skin : Warm and dry.  Head: Normocephalic and atraumatic  Eyes: Sclera and conjunctiva clear; pupils round and reactive to light; extraocular movements intact  Ears: External normal; canals clear; tympanic membranes normal  Oropharynx: Pink, supple. No suspicious lesions  Neck: Supple without thyromegaly, adenopathy  Lungs: Respirations unlabored; clear to auscultation bilaterally without wheeze, rales, rhonchi  CVS exam: normal rate and regular rhythm.  Neurologic: Alert and oriented; speech intact; face symmetrical; moves all extremities well; CNII-XII intact without focal deficit   Assessment:  1. Bilateral hand pain   2. Encounter for colonoscopy due to history of colonic polyp   3. Type 2 diabetes mellitus without complication, without long-term current use of insulin (HCC)   4. Numbness and tingling of both lower extremities   5. Vitamin D deficiency     Plan:  Referral to occupational therapy; Referral updated; Update labs today; will determine medication dosages based on labs today;  Check B12, magnesium today; Check Vitamin D level today;   No follow-ups on file.  Orders Placed This Encounter  Procedures   CBC with Differential/Platelet   Comp Met (CMET)   Hemoglobin A1c   Vitamin D (25 hydroxy)   Urine Microalbumin w/creat. ratio   B12   Magnesium   Ambulatory referral to Occupational Therapy    Referral Priority:   Routine    Referral Type:   Occupational Therapy    Referral Reason:   Specialty Services Required    Requested Specialty:   Occupational Therapy    Number of Visits Requested:   1   Ambulatory referral to Gastroenterology    Referral Priority:    Routine    Referral Type:   Consultation    Referral Reason:   Specialty Services Required    Number of Visits Requested:   1    Requested Prescriptions    No prescriptions requested or ordered in this encounter

## 2023-05-20 LAB — COMPREHENSIVE METABOLIC PANEL
AG Ratio: 1.8 (calc) (ref 1.0–2.5)
ALT: 35 U/L — ABNORMAL HIGH (ref 6–29)
AST: 20 U/L (ref 10–35)
Albumin: 4.5 g/dL (ref 3.6–5.1)
Alkaline phosphatase (APISO): 66 U/L (ref 37–153)
BUN: 15 mg/dL (ref 7–25)
CO2: 25 mmol/L (ref 20–32)
Calcium: 10.2 mg/dL (ref 8.6–10.4)
Chloride: 100 mmol/L (ref 98–110)
Creat: 0.5 mg/dL (ref 0.50–1.05)
Globulin: 2.5 g/dL (ref 1.9–3.7)
Glucose, Bld: 111 mg/dL — ABNORMAL HIGH (ref 65–99)
Potassium: 4.2 mmol/L (ref 3.5–5.3)
Sodium: 136 mmol/L (ref 135–146)
Total Bilirubin: 0.4 mg/dL (ref 0.2–1.2)
Total Protein: 7 g/dL (ref 6.1–8.1)

## 2023-05-20 LAB — CBC WITH DIFFERENTIAL/PLATELET
Absolute Lymphocytes: 2584 {cells}/uL (ref 850–3900)
Absolute Monocytes: 539 {cells}/uL (ref 200–950)
Basophils Absolute: 52 {cells}/uL (ref 0–200)
Basophils Relative: 0.6 %
Eosinophils Absolute: 487 {cells}/uL (ref 15–500)
Eosinophils Relative: 5.6 %
HCT: 42.8 % (ref 35.0–45.0)
Hemoglobin: 14 g/dL (ref 11.7–15.5)
MCH: 27.3 pg (ref 27.0–33.0)
MCHC: 32.7 g/dL (ref 32.0–36.0)
MCV: 83.4 fL (ref 80.0–100.0)
MPV: 9.8 fL (ref 7.5–12.5)
Monocytes Relative: 6.2 %
Neutro Abs: 5037 {cells}/uL (ref 1500–7800)
Neutrophils Relative %: 57.9 %
Platelets: 310 10*3/uL (ref 140–400)
RBC: 5.13 10*6/uL — ABNORMAL HIGH (ref 3.80–5.10)
RDW: 13.8 % (ref 11.0–15.0)
Total Lymphocyte: 29.7 %
WBC: 8.7 10*3/uL (ref 3.8–10.8)

## 2023-05-20 LAB — VITAMIN B12: Vitamin B-12: 468 pg/mL (ref 200–1100)

## 2023-05-20 LAB — MICROALBUMIN / CREATININE URINE RATIO
Creatinine, Urine: 50 mg/dL (ref 20–275)
Microalb Creat Ratio: 4 mg/g{creat} (ref <30)
Microalb, Ur: 0.2 mg/dL

## 2023-05-20 LAB — HEMOGLOBIN A1C
Hgb A1c MFr Bld: 6.9 %{Hb} — ABNORMAL HIGH (ref <5.7)
Mean Plasma Glucose: 151 mg/dL
eAG (mmol/L): 8.4 mmol/L

## 2023-05-20 LAB — MAGNESIUM: Magnesium: 2 mg/dL (ref 1.5–2.5)

## 2023-05-20 LAB — VITAMIN D 25 HYDROXY (VIT D DEFICIENCY, FRACTURES): Vit D, 25-Hydroxy: 36 ng/mL (ref 30–100)

## 2023-05-23 ENCOUNTER — Ambulatory Visit: Payer: Medicare Other | Admitting: Physical Therapy

## 2023-05-23 ENCOUNTER — Encounter: Payer: Self-pay | Admitting: Physical Therapy

## 2023-05-23 ENCOUNTER — Encounter: Payer: Self-pay | Admitting: Family

## 2023-05-23 DIAGNOSIS — M6281 Muscle weakness (generalized): Secondary | ICD-10-CM | POA: Diagnosis not present

## 2023-05-23 DIAGNOSIS — M79642 Pain in left hand: Secondary | ICD-10-CM | POA: Diagnosis not present

## 2023-05-23 DIAGNOSIS — R600 Localized edema: Secondary | ICD-10-CM | POA: Diagnosis not present

## 2023-05-23 DIAGNOSIS — R519 Headache, unspecified: Secondary | ICD-10-CM | POA: Diagnosis not present

## 2023-05-23 DIAGNOSIS — M25642 Stiffness of left hand, not elsewhere classified: Secondary | ICD-10-CM | POA: Diagnosis not present

## 2023-05-23 DIAGNOSIS — M25641 Stiffness of right hand, not elsewhere classified: Secondary | ICD-10-CM | POA: Diagnosis not present

## 2023-05-23 DIAGNOSIS — R278 Other lack of coordination: Secondary | ICD-10-CM | POA: Diagnosis not present

## 2023-05-23 DIAGNOSIS — M542 Cervicalgia: Secondary | ICD-10-CM

## 2023-05-23 DIAGNOSIS — M79641 Pain in right hand: Secondary | ICD-10-CM | POA: Diagnosis not present

## 2023-05-23 DIAGNOSIS — G8929 Other chronic pain: Secondary | ICD-10-CM | POA: Diagnosis not present

## 2023-05-23 DIAGNOSIS — M25611 Stiffness of right shoulder, not elsewhere classified: Secondary | ICD-10-CM

## 2023-05-23 DIAGNOSIS — M25511 Pain in right shoulder: Secondary | ICD-10-CM | POA: Diagnosis not present

## 2023-05-23 NOTE — Therapy (Signed)
OUTPATIENT PHYSICAL THERAPY SHOULDER   Patient Name: Veronica Galvan MRN: [id number] DOB:June 13, 1955, 68 y.o., female Today's Date: 05/23/2023  END OF SESSION:  PT End of Session - 05/23/23 0847     Visit Number 44    Date for PT Re-Evaluation 06/02/23    Authorization Type [employer] [place]    PT Start Time 0840    PT Stop Time 0945    PT Time Calculation (min) 65 min    Activity Tolerance Patient tolerated treatment well    Behavior During Therapy WFL for tasks assessed/performed             Past Medical History:  Diagnosis Date   Allergy    Anxiety    Arthritis    hands   Cataract    Diabetes mellitus without complication (HCC)    type 2   Family history of adverse reaction to anesthesia    son has malignant hyperthermia, daughter does not daughter recently had c section without problems   GERD (gastroesophageal reflux disease)    Headache    sinus   Hyperlipidemia    Hypertension    PONV (postoperative nausea and vomiting)    nausea only   Ulcer, stomach peptic yrs ago   Past Surgical History:  Procedure Laterality Date   APPENDECTOMY     both hells bone spur repair     both heels with metal clips   both shoulder rotator cuff repair     CESAREAN SECTION     x 1   COLONOSCOPY WITH PROPOFOL N/A 09/07/2016   Procedure: COLONOSCOPY WITH PROPOFOL;  Surgeon: Charolett Bumpers, MD;  Location: [facility];  Service: Endoscopy;  Laterality: N/A;   colonscopy  06/2011   polyps   EYE SURGERY     FRACTURE SURGERY     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/10/2022   Procedure: REVERSE SHOULDER ARTHROPLASTY;  Surgeon: Francena Hanly, MD;  Location: [facility];  Service: Orthopedics;  Laterality: Right;  Please follow in room 6 if able   TUBAL LIGATION     VESICO-VAGINAL FISTULA REPAIR     Patient Active Problem List   Diagnosis Date Noted   Gastroesophageal reflux disease 11/16/2021   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 08/18/2021    Dermatofibroma 08/18/2021   History of malignant neoplasm of skin 08/18/2021   Lentigo 08/18/2021   Melanocytic nevi of trunk 08/18/2021   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   Rosacea 08/18/2021   Actinic keratosis 08/18/2021   Sensorineural hearing loss (SNHL) of left ear with unrestricted hearing of right ear 07/26/2021   Tinnitus of left ear 07/26/2021   Acute recurrent maxillary sinusitis 06/22/2021   Primary hypertension 01/12/2021   Hyperlipidemia associated with type 2 diabetes mellitus (HCC) 01/12/2021   Arthritis 01/12/2021   Type II diabetes mellitus (HCC) 11/25/2019   Ingrown toenail 09/05/2018   Neck pain 01/29/2018   Tick bite 10/24/2017    PCP: Dayton Scrape, [name]  REFERRING PROVIDER: Rennis Chris, MD  REFERRING DIAG: s/p right reverse TSA  THERAPY DIAG:  Acute pain of right shoulder  Stiffness of right shoulder, not elsewhere classified  Localized edema  Cervicalgia  Muscle weakness (generalized)  Rationale for Evaluation and Treatment: Rehabilitation  ONSET DATE: 11/05/22  SUBJECTIVE:  SUBJECTIVE STATEMENT:  Still sore, strength and stamina are frustrating  PAIN:  Are you having pain? Yes: NPRS scale: 4/10 Pain location: right shoulder Pain description: dull ache at rest, sharp with motions Aggravating factors: quick motions, movements pain up to 10/10 Relieving factors: ice, rest, Tylenol at best 3/10  PRECAUTIONS: Shoulder Protocol in the chart  RED FLAGS: None   WEIGHT BEARING RESTRICTIONS: No  FALLS:  Has patient fallen in last 6 months? Yes. Number of falls 1  LIVING ENVIRONMENT: Lives with: lives alone Lives in: House/apartment Stairs: No Has following equipment at home: None  OCCUPATION: retired  PLOF: Independent  PATIENT GOALS:dress without  difficulty, do hair, have good ROM and less pain  NEXT MD VISIT:   OBJECTIVE:   DIAGNOSTIC FINDINGS:  See above  PATIENT SURVEYS:  FOTO 21  COGNITION: Overall cognitive status: Within functional limits for tasks assessed     SENSATION: WFL  POSTURE: Fwd head, rounded shoulders, elevated and guarded shoulder  UPPER EXTREMITY ROM:   Active ROM Right PROM eval Right PROM Supine 01/05/23 PROM  01/26/23 PROM 02/09/23 AROM sitting 02/14/23 AROM 02/21/23 AROM  03/02/23 AROM  03/22/23 AAROM 03/28/23 AROM Standing 04/13/23 AROM Standing 05/02/23 AROM  Standing 05/11/23  Shoulder flexion 35  118  123 90 96 95 100 111 110 112 120  Shoulder extension 5              Shoulder abduction 20  90 92  73 80 83 90 93 90 92 98  Shoulder adduction               Shoulder internal rotation 15              Shoulder external rotation 20  45 30  41  45 50 53 56 56 60  Elbow flexion 120              Elbow extension 5              Wrist flexion               Wrist extension               Wrist ulnar deviation               Wrist radial deviation               Wrist pronation               Wrist supination               (Blank rows = not tested)  UPPER EXTREMITY MMT:  No tested due to recent surgery  MMT Right eval Left eval  Shoulder flexion    Shoulder extension    Shoulder abduction    Shoulder adduction    Shoulder internal rotation    Shoulder external rotation    Middle trapezius    Lower trapezius    Elbow flexion    Elbow extension    Wrist flexion    Wrist extension    Wrist ulnar deviation    Wrist radial deviation    Wrist pronation    Wrist supination    Grip strength (lbs)    (Blank rows = not tested)  PALPATION:  Very tight and tender in the pectoral, upper trap, the entire right upper arm   TODAY'S TREATMENT:  DATE:   05/23/23 Nustep push and pull front to back and across body total 5 minutes Seated row 20# Lats 20# Chest press 5# 25# triceps 10# biceps 3# wate bar overhead press 3# wate bar extension Supine wate bare chest press 3# chest press 3# ER/IR 3# wate bar flexion in supine Passive stretch all motions STM to the right upper arm Vaso medium pressure 34 degrees  05/18/23 UBE level 4 x 4 minutes Nustep push pull and cross body push /pull level 3 Fitter 1 blue band side to side 5# chest press Doorway stretch 3 ways Passive right shoulder stretch all motions except did very light IR according to protocol STM to the right shoulder and upper arm Vaso medium pressure 36 degrees  05/16/23 UBE level 4 x 5 minutes Rows 15# Lats 20# Chest press 5# Small ball throws Yellow tband ER/IR Yellow row and flexion tband  Supine 3# punches 3# isometric circles 3# ER/IR STM to the right upper arm PROM right shoulder Vaso medium pressure  05/11/23 Weight 193.5# Nustep level 5 x 5 minutes Wall slide and circles 15# lat pulls Wall slides PROM right shoulder all motions except IR STM to the right upper arm AROM measured as noted above Vaso medium pressure 34 degrees  05/09/23 Weight 195# Standing UBE level 4 x 4 minutes Triceps 25# 2x15 Biceps 10# 2x15 Fitter 1 blue band push/pull then side to side Small weighted ball circles with pressure on wall small weighted ball tosses Weighted ball overhead rhythmic stabilization Physioball chest pass and overhead pass Supine 5# chest press small ROM Supine 3# flexion small ROM Supine Er from belly 2# PROM of the shoulder Vaso 34 degrees  05/04/23 Supine 3# wate bard chest press 3# wate bar flexion 3# serratus push 3# isometric circles 2# ER/IR PROM right shoulder  STM to the right upper trap and the right upper arm Vaso right low pressure 34 degrees  05/02/23 UBE level 4 x 5 minutes 20# rows 20# lats 20# triceps Wall slides,  circles and overhead side to side 2# wate bar over head lift Yellow tband ER/IR Yellow tband horizontal abduction PROM right shoulder STM to the right shoulder, very tender in the right biceps and deltoid Vaso 34 degrees right shoulder  04/27/23 UBE level 3.5 x 5 minutes Passive stretch to her tolerance for flexion ER and abduction Supine 3# punches 2# ER from belly 2# flexion from 80 degrees to 100 degrees 4# isometric circles 4# serratus push STM to the neck, the upper trap, rhomboid and right upper arm Vaso 34 degrees low pressure  04/25/23 UBE level 3 x 6 minutes Wall slides, circles and overhead side to side AAROM with stick Yellow tband ER/IR STM to the right shoulder, upper trap and upper arm Vaso 34 degrees  04/20/23 AROM flexion 116 UBE level 4 x 4 minutes 10# straight arm pulls 15# rows 20# lats with assist Supine chest press 3# 3# serratus 3# isometric circles STM to the right upper trap and the rhomboid and right upper arm Vaso right shoulder 35 degrees  04/18/23 UBE level 4 x 5 minutes Wall slides, circles and side to side overhead Supine 2# punches 2# flexion 2# ER/IR 3# isometric circles Passive stretch STM to the neck, upper trap and upper arm Vaso low pressure   PATIENT EDUCATION: Education details: poc/hep Person educated: Patient Education method: Programmer, multimedia, Facilities manager, Actor cues, Verbal cues, and Handouts Education comprehension: verbalized understanding  HOME EXERCISE PROGRAM: Access Code: [email or web address] URL: [email or web address] Date:  12/20/2022 Prepared by: Stacie Glaze  Exercises - Seated Shoulder Flexion Towel Slide at Table Top  - 2 x daily - 7 x weekly - 2 sets - 10 reps - 3 hold - Seated Shoulder External Rotation PROM on Table  - 2 x daily - 7 x weekly - 2 sets - 10 reps - 3 hold - Seated Elbow Extension and Shoulder External Rotation AAROM at Table with Towel  - 2 x daily - 7 x weekly - 2 sets - 10  reps - 3 hold - Circular Shoulder Pendulum with Table Support  - 2 x daily - 7 x weekly - 2 sets - 10 reps - 3 hold  ASSESSMENT:  CLINICAL IMPRESSION: Patient saw her primary MD mentioned the tingling they will do blood work.  She remains very active and c/o some fatigue.  She is allowing better passive stretch, still requires cues to relax and allow the passive motions  OBJECTIVE IMPAIRMENTS: cardiopulmonary status limiting activity, decreased activity tolerance, decreased endurance, decreased ROM, decreased strength, increased edema, increased muscle spasms, impaired flexibility, impaired UE functional use, improper body mechanics, postural dysfunction, and pain .  REHAB POTENTIAL: Good  CLINICAL DECISION MAKING: Evolving/moderate complexity  EVALUATION COMPLEXITY: Low   GOALS: Goals reviewed with patient? Yes  SHORT TERM GOALS: Target date: 01/01/23  Independent with initial HEP Goal status: 12/27/22 MET  LONG TERM GOALS: Target date: 03/22/23  Decrease pain 50% Goal status: 03/22/23 progressing  2.  Dress without difficulty Goal status: progressing 05/11/23  3.  Do hair without difficulty Goal status: able to reach the back of her head at times 05/02/23 progressing  4.  Increase AROM right shoulder flexion to 130 degrees Goal status: 05/02/23 progressing  5.  Increase right shoulder ER to 60 degrees Goal status: progressing 05/11/23  6.  Return to water aerobics and or gym activity Goal status:progressing met 05/18/23  PLAN:  PT FREQUENCY: 1-2x/week  PT DURATION: 12 weeks  PLANNED INTERVENTIONS: Therapeutic exercises, Therapeutic activity, Neuromuscular re-education, Balance training, Gait training, Patient/Family education, Self Care, Joint mobilization, Dry Needling, Electrical stimulation, Cryotherapy, Vasopneumatic device, and Manual therapy  PLAN FOR NEXT SESSION:   measure and assess Jearld Lesch, PT 05/23/2023, 8:48 AM [facility] at [facility] [address] [facility]. [place], [place], [address] Phone: [phone]   Fax:  [phone]

## 2023-05-23 NOTE — Therapy (Signed)
OUTPATIENT OCCUPATIONAL THERAPY ORTHO EVALUATION  Patient Name: LUKISHA STRATMAN MRN: [id number] DOB:[birth year], 68 y.o., female Today's Date: 05/24/2023  PCP: Olive Bass [name] REFERRING PROVIDER: Olive Bass [name]  END OF SESSION:  OT End of Session - 05/24/23 1515     Visit Number 1    Number of Visits 25    Date for OT Re-Evaluation 08/16/23    Authorization Type BCBS MCR    Authorization - Visit Number 1    Progress Note Due on Visit 10    OT Start Time 1318    OT Stop Time 1400    OT Time Calculation (min) 42 min    Activity Tolerance Patient tolerated treatment well    Behavior During Therapy WFL for tasks assessed/performed             Past Medical History:  Diagnosis Date   Allergy    Anxiety    Arthritis    hands   Cataract    Diabetes mellitus without complication (HCC)    type 2   Family history of adverse reaction to anesthesia    son has malignant hyperthermia, daughter does not daughter recently had c section without problems   GERD (gastroesophageal reflux disease)    Headache    sinus   Hyperlipidemia    Hypertension    PONV (postoperative nausea and vomiting)    nausea only   Ulcer, stomach peptic yrs ago   Past Surgical History:  Procedure Laterality Date   APPENDECTOMY     both hells bone spur repair     both heels with metal clips   both shoulder rotator cuff repair     CESAREAN SECTION     x 1   COLONOSCOPY WITH PROPOFOL N/A 09/07/2016   Procedure: COLONOSCOPY WITH PROPOFOL;  Surgeon: Charolett Bumpers, MD;  Location: [facility];  Service: Endoscopy;  Laterality: N/A;   colonscopy  06/2011   polyps   EYE SURGERY     FRACTURE SURGERY     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/10/2022   Procedure: REVERSE SHOULDER ARTHROPLASTY;  Surgeon: Francena Hanly, MD;  Location: [facility];  Service: Orthopedics;  Laterality: Right;  Please follow in room 6 if able   TUBAL LIGATION     VESICO-VAGINAL FISTULA REPAIR      Patient Active Problem List   Diagnosis Date Noted   Gastroesophageal reflux disease 11/16/2021   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 08/18/2021   Dermatofibroma 08/18/2021   History of malignant neoplasm of skin 08/18/2021   Lentigo 08/18/2021   Melanocytic nevi of trunk 08/18/2021   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   Rosacea 08/18/2021   Actinic keratosis 08/18/2021   Sensorineural hearing loss (SNHL) of left ear with unrestricted hearing of right ear 07/26/2021   Tinnitus of left ear 07/26/2021   Acute recurrent maxillary sinusitis 06/22/2021   Primary hypertension 01/12/2021   Hyperlipidemia associated with type 2 diabetes mellitus (HCC) 01/12/2021   Arthritis 01/12/2021   Type II diabetes mellitus (HCC) 11/25/2019   Ingrown toenail 09/05/2018   Neck pain 01/29/2018   Tick bite 10/24/2017    ONSET DATE: 05/19/23  REFERRING DIAG:  Diagnosis  M79.641,M79.642 (ICD-10-CM) - Bilateral hand pain    THERAPY DIAG:  Muscle weakness (generalized) - Plan: Ot plan of care cert/re-cert  Stiffness of left hand, not elsewhere classified - Plan: Ot plan of care cert/re-cert  Stiffness of right hand, not elsewhere classified - Plan: Ot plan of care  cert/re-cert  Other lack of coordination - Plan: Ot plan of care cert/re-cert  Pain in left hand - Plan: Ot plan of care cert/re-cert  Pain in right hand - Plan: Ot plan of care cert/re-cert  Rationale for Evaluation and Treatment: Rehabilitation  SUBJECTIVE:   SUBJECTIVE STATEMENT: Pt reports bilateral hand pain and stiffness Pt accompanied by: self  PERTINENT HISTORY: 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 Yes: NPRS scale: 5-8/10 Pain location:  bilateral hands Pain description: aching, stiff Aggravating factors: gripping  Relieving factors: heat, meds Pt also has shoulder pain 4-5/10 which OT will not address as PT is working with pt. to address.  FALLS: Has patient fallen in last 6 months? No  LIVING ENVIRONMENT: Lives with: lives alone Lives in: House/apartment Stairs: yes   PLOF: Independent  PATIENT GOALS: improve functional use of hands and learn adapted strategies for ADLs/IADLs.  NEXT MD VISIT: unknown  OBJECTIVE:  Note: Objective measures were completed at Evaluation unless otherwise noted.  HAND DOMINANCE: Right  ADLs: Overall ADLs: unable to grip credit card to remove from ATM Transfers/ambulation related to ADLs: mod I Eating: drops silverware Grooming: drops toothbrush, uses electric toothbrush, holding comb Upper body dressing: adjusting bra Lower body dressing: difficulty pulling up pants  Toileting: hygeine was difficult initally Bathing: mod I, difficult with shaving Tub shower transfers: walk in shower    FUNCTIONAL OUTCOME MEASURES: Quick Dash: TBA  UPPER EXTREMITY ROM:   RUE wrist flexion/ extension: 70/ 50, LUE wrist flexion/ extension: 70/ 45 Pt demonstrates grossly 50% composite flexion for right and 555 with left.   Active ROM Right eval Left eval  Thumb MCP (0-60) 45 55  Thumb IP (0-80) 25 10  Thumb Radial abd/add (0-55)     Thumb Palmar abd/add (0-45)     Thumb Opposition to Small Finger yes yes   Index MCP (0-90)     Index PIP (0-100)     Index DIP (0-70)      Long MCP (0-90)      Long PIP (0-100)      Long DIP (0-70)      Ring MCP (0-90)      Ring PIP (0-100)      Ring DIP (0-70)      Little MCP (0-90)      Little PIP (0-100)      Little DIP (0-70)      (Blank rows = not tested)   HAND FUNCTION: Grip strength: Right: TBA lbs; Left: TBA lbs,   SENSATION: Light touch: Impaired index finger LUE  EDEMA: Pt with bony defomities at PIP joints of all digits which  pt reports are bone spurs, Pt also has bony deformity at DIP for right thumb, index and 5th digit and LUE small and index fingers  COGNITION: Overall cognitive status: Within functional limits for tasks assessed   OBSERVATIONS: Pleasant female desires to increase functional use of bilateral UE's   TREATMENT DATE: 05/24/23 eval only  PATIENT EDUCATION: Education details: role of OT, potential goals Person educated: Patient Education method: Explanation Education comprehension: verbalized understanding  HOME EXERCISE PROGRAM: n/a  GOALS: Goals reviewed with patient? Yes  SHORT TERM GOALS: Target date: 06/24/23  I with HEP  Goal status: INITIAL  2.  I with positioning/ splinting prn to minimize pain and defomity   Goal status: INITIAL  3.  Check grip strength and set goal prn  Goal status: INITIAL    LONG TERM GOALS: Target date: 08/16/23  I with updated HEP  Goal status: INITIAL  2.  check Quick Dash and set goal  Goal status: INITIAL  3.  Pt will demonstrate at least 65% composte flexion for LUE for increased functional use.  Goal status: INITIAL  4.  I with adapted strategies/ adapted equipment to minimize pain and to increase pt I with ADLs/IADLs  Goal status: INITIAL  5.  Pt will demonstrate at least 65% composite flexion for RUE for increased functional use.  Goal status: INITIAL    ASSESSMENT:  CLINICAL IMPRESSION: Patient is a 68 y.o. female who was seen today for occupational therapy evaluation for bilateral hand pain today due to arthritis and bone spurs. Pt s/p recent right reverse TSA which is being addressed by PT. Pt presents with the following deficits: decreased strength, decreased ROM,decreased coordiantion, pain, and decreased UE functional use which impdes performance of ADLs/IADL. Pt can benefit from skilled  OT to address these defictis in order to maximize pt's safety and I with daily activities.Marland Kitchen   PERFORMANCE DEFICITS: in functional skills including ADLs, IADLs, coordination, sensation, edema, ROM, strength, pain, flexibility, Fine motor control, Gross motor control, endurance, decreased knowledge of precautions, decreased knowledge of use of DME, and UE functional use,, and psychosocial skills including coping strategies, environmental adaptation, habits, interpersonal interactions, and routines and behaviors.   IMPAIRMENTS: are limiting patient from ADLs, IADLs, rest and sleep, education, play, leisure, and social participation.   COMORBIDITIES: may have co-morbidities  that affects occupational performance. Patient will benefit from skilled OT to address above impairments and improve overall function.  MODIFICATION OR ASSISTANCE TO COMPLETE EVALUATION: No modification of tasks or assist necessary to complete an evaluation.  OT OCCUPATIONAL PROFILE AND HISTORY: Detailed assessment: Review of records and additional review of physical, cognitive, psychosocial history related to current functional performance.  CLINICAL DECISION MAKING: LOW - limited treatment options, no task modification necessary  REHAB POTENTIAL: Good  EVALUATION COMPLEXITY: Low      PLAN:  OT FREQUENCY: 2x/week plus eval  OT DURATION: 12 weeks  PLANNED INTERVENTIONS: 97168 OT Re-evaluation, 97535 self care/ADL training, 09811 therapeutic exercise, 97530 therapeutic activity, 97112 neuromuscular re-education, 97140 manual therapy, 97113 aquatic therapy, 97035 ultrasound, 97018 paraffin, 91478 fluidotherapy, 97010 moist heat, 97010 cryotherapy, 97034 contrast bath, 97760 Orthotics management and training, 29562 Splinting (initial encounter), [id number] Subsequent splinting/medication, passive range of motion, energy conservation, coping strategies training, patient/family education, and DME and/or AE  instructions  RECOMMENDED OTHER SERVICES: none  CONSULTED AND AGREED WITH PLAN OF CARE: Patient  PLAN FOR NEXT SESSION: paraffin, gentle ROM, AE/ strategies, check grip and Quick Dash   Tymara Saur, OT 05/24/2023, 3:40 PM

## 2023-05-24 ENCOUNTER — Ambulatory Visit: Payer: Medicare Other | Admitting: Occupational Therapy

## 2023-05-24 DIAGNOSIS — R519 Headache, unspecified: Secondary | ICD-10-CM | POA: Diagnosis not present

## 2023-05-24 DIAGNOSIS — M25641 Stiffness of right hand, not elsewhere classified: Secondary | ICD-10-CM

## 2023-05-24 DIAGNOSIS — M79641 Pain in right hand: Secondary | ICD-10-CM

## 2023-05-24 DIAGNOSIS — M79642 Pain in left hand: Secondary | ICD-10-CM

## 2023-05-24 DIAGNOSIS — G8929 Other chronic pain: Secondary | ICD-10-CM | POA: Diagnosis not present

## 2023-05-24 DIAGNOSIS — M25642 Stiffness of left hand, not elsewhere classified: Secondary | ICD-10-CM | POA: Diagnosis not present

## 2023-05-24 DIAGNOSIS — R278 Other lack of coordination: Secondary | ICD-10-CM

## 2023-05-24 DIAGNOSIS — M542 Cervicalgia: Secondary | ICD-10-CM | POA: Diagnosis not present

## 2023-05-24 DIAGNOSIS — M6281 Muscle weakness (generalized): Secondary | ICD-10-CM | POA: Diagnosis not present

## 2023-05-24 DIAGNOSIS — R600 Localized edema: Secondary | ICD-10-CM | POA: Diagnosis not present

## 2023-05-24 DIAGNOSIS — M25511 Pain in right shoulder: Secondary | ICD-10-CM | POA: Diagnosis not present

## 2023-05-24 DIAGNOSIS — M25611 Stiffness of right shoulder, not elsewhere classified: Secondary | ICD-10-CM | POA: Diagnosis not present

## 2023-05-25 ENCOUNTER — Encounter: Payer: Self-pay | Admitting: Physical Therapy

## 2023-05-25 ENCOUNTER — Encounter: Payer: Self-pay | Admitting: Family

## 2023-05-25 ENCOUNTER — Ambulatory Visit: Payer: Medicare Other | Admitting: Physical Therapy

## 2023-05-25 DIAGNOSIS — M25611 Stiffness of right shoulder, not elsewhere classified: Secondary | ICD-10-CM | POA: Diagnosis not present

## 2023-05-25 DIAGNOSIS — R600 Localized edema: Secondary | ICD-10-CM | POA: Diagnosis not present

## 2023-05-25 DIAGNOSIS — M6281 Muscle weakness (generalized): Secondary | ICD-10-CM

## 2023-05-25 DIAGNOSIS — M542 Cervicalgia: Secondary | ICD-10-CM | POA: Diagnosis not present

## 2023-05-25 DIAGNOSIS — M79642 Pain in left hand: Secondary | ICD-10-CM | POA: Diagnosis not present

## 2023-05-25 DIAGNOSIS — M25642 Stiffness of left hand, not elsewhere classified: Secondary | ICD-10-CM | POA: Diagnosis not present

## 2023-05-25 DIAGNOSIS — M25641 Stiffness of right hand, not elsewhere classified: Secondary | ICD-10-CM | POA: Diagnosis not present

## 2023-05-25 DIAGNOSIS — G8929 Other chronic pain: Secondary | ICD-10-CM | POA: Diagnosis not present

## 2023-05-25 DIAGNOSIS — M25511 Pain in right shoulder: Secondary | ICD-10-CM

## 2023-05-25 DIAGNOSIS — R519 Headache, unspecified: Secondary | ICD-10-CM | POA: Diagnosis not present

## 2023-05-25 DIAGNOSIS — M79641 Pain in right hand: Secondary | ICD-10-CM | POA: Diagnosis not present

## 2023-05-25 DIAGNOSIS — R278 Other lack of coordination: Secondary | ICD-10-CM | POA: Diagnosis not present

## 2023-05-25 NOTE — Therapy (Signed)
OUTPATIENT PHYSICAL THERAPY SHOULDER   Patient Name: Veronica Galvan MRN: [id number] DOB:1955/10/17, 68 y.o., female Today's Date: 05/25/2023  END OF SESSION:  PT End of Session - 05/25/23 0759     Visit Number 45    Date for PT Re-Evaluation 06/02/23    Authorization Type [employer]BS [place]    PT Start Time [phone]    PT Stop Time 0858    PT Time Calculation (min) 59 min    Activity Tolerance Patient tolerated treatment well    Behavior During Therapy [facility] for tasks assessed/performed             Past Medical History:  Diagnosis Date   Allergy    Anxiety    Arthritis    hands   Cataract    Diabetes mellitus without complication (HCC)    type 2   Family history of adverse reaction to anesthesia    son has malignant hyperthermia, daughter does not daughter recently had c section without problems   GERD (gastroesophageal reflux disease)    Headache    sinus   Hyperlipidemia    Hypertension    PONV (postoperative nausea and vomiting)    nausea only   Ulcer, stomach peptic yrs ago   Past Surgical History:  Procedure Laterality Date   APPENDECTOMY     both hells bone spur repair     both heels with metal clips   both shoulder rotator cuff repair     CESAREAN SECTION     x 1   COLONOSCOPY WITH PROPOFOL N/A 09/07/2016   Procedure: COLONOSCOPY WITH PROPOFOL;  Surgeon: Charolett Bumpers, MD;  Location: [facility];  Service: Endoscopy;  Laterality: N/A;   colonscopy  06/2011   polyps   EYE SURGERY     FRACTURE SURGERY     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/10/2022   Procedure: REVERSE SHOULDER ARTHROPLASTY;  Surgeon: Francena Hanly, MD;  Location: [facility];  Service: Orthopedics;  Laterality: Right;  Please follow in room 6 if able   TUBAL LIGATION     VESICO-VAGINAL FISTULA REPAIR     Patient Active Problem List   Diagnosis Date Noted   Gastroesophageal reflux disease 11/16/2021   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 08/18/2021    Dermatofibroma 08/18/2021   History of malignant neoplasm of skin 08/18/2021   Lentigo 08/18/2021   Melanocytic nevi of trunk 08/18/2021   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   Rosacea 08/18/2021   Actinic keratosis 08/18/2021   Sensorineural hearing loss (SNHL) of left ear with unrestricted hearing of right ear 07/26/2021   Tinnitus of left ear 07/26/2021   Acute recurrent maxillary sinusitis 06/22/2021   Primary hypertension 01/12/2021   Hyperlipidemia associated with type 2 diabetes mellitus (HCC) 01/12/2021   Arthritis 01/12/2021   Type II diabetes mellitus (HCC) 11/25/2019   Ingrown toenail 09/05/2018   Neck pain 01/29/2018   Tick bite 10/24/2017    PCP: Dayton Scrape, [name]  REFERRING PROVIDER: Rennis Chris, MD  REFERRING DIAG: s/p right reverse TSA  THERAPY DIAG:  Muscle weakness (generalized)  Acute pain of right shoulder  Stiffness of right shoulder, not elsewhere classified  Localized edema  Cervicalgia  Rationale for Evaluation and Treatment: Rehabilitation  ONSET DATE: 11/05/22  SUBJECTIVE:  SUBJECTIVE STATEMENT:  I just get sore and tired so easily I think.  PAIN:  Are you having pain? Yes: NPRS scale: 4/10 Pain location: right shoulder Pain description: dull ache at rest, sharp with motions Aggravating factors: quick motions, movements pain up to 10/10 Relieving factors: ice, rest, Tylenol at best 3/10  PRECAUTIONS: Shoulder Protocol in the chart  RED FLAGS: None   WEIGHT BEARING RESTRICTIONS: No  FALLS:  Has patient fallen in last 6 months? Yes. Number of falls 1  LIVING ENVIRONMENT: Lives with: lives alone Lives in: House/apartment Stairs: No Has following equipment at home: None  OCCUPATION: retired  PLOF: Independent  PATIENT GOALS:dress without  difficulty, do hair, have good ROM and less pain  NEXT MD VISIT:   OBJECTIVE:   DIAGNOSTIC FINDINGS:  See above  PATIENT SURVEYS:  FOTO 21  COGNITION: Overall cognitive status: Within functional limits for tasks assessed     SENSATION: WFL  POSTURE: Fwd head, rounded shoulders, elevated and guarded shoulder  UPPER EXTREMITY ROM:   Active ROM Right PROM eval Right PROM Supine 01/05/23 PROM  01/26/23 PROM 02/09/23 AROM sitting 02/14/23 AROM 02/21/23 AROM  03/02/23 AROM  03/22/23 AAROM 03/28/23 AROM Standing 04/13/23 AROM Standing 05/02/23 AROM  Standing 05/11/23 AROM  Standing 05/25/23  Shoulder flexion 35  118  123 90 96 95 100 111 110 112 120 125   Shoulder extension 5               Shoulder abduction 20  90 92  73 80 83 90 93 90 92 98 100  Shoulder adduction                Shoulder internal rotation 15             40  Shoulder external rotation 20  45 30  41  45 50 53 56 56 60 66  Elbow flexion 120               Elbow extension 5               Wrist flexion                Wrist extension                Wrist ulnar deviation                Wrist radial deviation                Wrist pronation                Wrist supination                (Blank rows = not tested)  UPPER EXTREMITY MMT:  No tested due to recent surgery  MMT Right eval Left eval  Shoulder flexion    Shoulder extension    Shoulder abduction    Shoulder adduction    Shoulder internal rotation    Shoulder external rotation    Middle trapezius    Lower trapezius    Elbow flexion    Elbow extension    Wrist flexion    Wrist extension    Wrist ulnar deviation    Wrist radial deviation    Wrist pronation    Wrist supination    Grip strength (lbs)    (Blank rows = not tested)  PALPATION:  Very tight and tender in the pectoral, upper trap, the entire right upper arm   TODAY'S TREATMENT:  DATE:  05/25/23 Nustep level 4 push and pull and then cross body push and pull 3# wate bar extension Body blade 20 seconds all motions 2x20 seconds Passive stretch all motions STM to the right upper arm Vaso medium pressure 34 degrees  05/23/23 Nustep push and pull front to back and across body total 5 minutes Seated row 20# Lats 20# Chest press 5# 25# triceps 10# biceps 3# wate bar overhead press 3# wate bar extension Supine wate bare chest press 3# chest press 3# ER/IR 3# wate bar flexion in supine Passive stretch all motions STM to the right upper arm Vaso medium pressure 34 degrees  05/18/23 UBE level 4 x 4 minutes Nustep push pull and cross body push /pull level 3 Fitter 1 blue band side to side 5# chest press Doorway stretch 3 ways Passive right shoulder stretch all motions except did very light IR according to protocol STM to the right shoulder and upper arm Vaso medium pressure 36 degrees  05/16/23 UBE level 4 x 5 minutes Rows 15# Lats 20# Chest press 5# Small ball throws Yellow tband ER/IR Yellow row and flexion tband  Supine 3# punches 3# isometric circles 3# ER/IR STM to the right upper arm PROM right shoulder Vaso medium pressure  05/11/23 Weight 193.5# Nustep level 5 x 5 minutes Wall slide and circles 15# lat pulls Wall slides PROM right shoulder all motions except IR STM to the right upper arm AROM measured as noted above Vaso medium pressure 34 degrees  05/09/23 Weight 195# Standing UBE level 4 x 4 minutes Triceps 25# 2x15 Biceps 10# 2x15 Fitter 1 blue band push/pull then side to side Small weighted ball circles with pressure on wall small weighted ball tosses Weighted ball overhead rhythmic stabilization Physioball chest pass and overhead pass Supine 5# chest press small ROM Supine 3# flexion small ROM Supine Er from belly 2# PROM of the shoulder Vaso 34 degrees  05/04/23 Supine 3# wate bard chest  press 3# wate bar flexion 3# serratus push 3# isometric circles 2# ER/IR PROM right shoulder  STM to the right upper trap and the right upper arm Vaso right low pressure 34 degrees  05/02/23 UBE level 4 x 5 minutes 20# rows 20# lats 20# triceps Wall slides, circles and overhead side to side 2# wate bar over head lift Yellow tband ER/IR Yellow tband horizontal abduction PROM right shoulder STM to the right shoulder, very tender in the right biceps and deltoid Vaso 34 degrees right shoulder  04/27/23 UBE level 3.5 x 5 minutes Passive stretch to her tolerance for flexion ER and abduction Supine 3# punches 2# ER from belly 2# flexion from 80 degrees to 100 degrees 4# isometric circles 4# serratus push STM to the neck, the upper trap, rhomboid and right upper arm Vaso 34 degrees low pressure  04/25/23 UBE level 3 x 6 minutes Wall slides, circles and overhead side to side AAROM with stick Yellow tband ER/IR STM to the right shoulder, upper trap and upper arm Vaso 34 degrees  04/20/23 AROM flexion 116 UBE level 4 x 4 minutes 10# straight arm pulls 15# rows 20# lats with assist Supine chest press 3# 3# serratus 3# isometric circles STM to the right upper trap and the rhomboid and right upper arm Vaso right shoulder 35 degrees  04/18/23 UBE level 4 x 5 minutes Wall slides, circles and side to side overhead Supine 2# punches 2# flexion 2# ER/IR 3# isometric circles Passive stretch STM to the neck, upper  trap and upper arm Vaso low pressure   PATIENT EDUCATION: Education details: poc/hep Person educated: Patient Education method: Programmer, multimedia, Facilities manager, Actor cues, Verbal cues, and Handouts Education comprehension: verbalized understanding  HOME EXERCISE PROGRAM: Access Code: [email or web address] URL: [email or web address] Date: 12/20/2022 Prepared by: Stacie Glaze  Exercises - Seated Shoulder Flexion Towel Slide at Table Top  - 2 x daily  - 7 x weekly - 2 sets - 10 reps - 3 hold - Seated Shoulder External Rotation PROM on Table  - 2 x daily - 7 x weekly - 2 sets - 10 reps - 3 hold - Seated Elbow Extension and Shoulder External Rotation AAROM at Table with Towel  - 2 x daily - 7 x weekly - 2 sets - 10 reps - 3 hold - Circular Shoulder Pendulum with Table Support  - 2 x daily - 7 x weekly - 2 sets - 10 reps - 3 hold  ASSESSMENT:  CLINICAL IMPRESSION: Patient with continued improvement in the shoulder AROM, it is slow and she is having some left shoulder pain probably from over doing it, I cautioned her on overhead activity when she goes to exercise class.  Continues to have pain in the anterior and lateral upper arm, some spasms palpable OBJECTIVE IMPAIRMENTS: cardiopulmonary status limiting activity, decreased activity tolerance, decreased endurance, decreased ROM, decreased strength, increased edema, increased muscle spasms, impaired flexibility, impaired UE functional use, improper body mechanics, postural dysfunction, and pain .  REHAB POTENTIAL: Good  CLINICAL DECISION MAKING: Evolving/moderate complexity  EVALUATION COMPLEXITY: Low   GOALS: Goals reviewed with patient? Yes  SHORT TERM GOALS: Target date: 01/01/23  Independent with initial HEP Goal status: 12/27/22 MET  LONG TERM GOALS: Target date: 03/22/23  Decrease pain 50% Goal status: 03/22/23 progressing  2.  Dress without difficulty Goal status: progressing 05/11/23  3.  Do hair without difficulty Goal status: able to reach the top of head at times progressing 05/25/23  4.  Increase AROM right shoulder flexion to 130 degrees Goal status: 05/25/23 progressing 120 degrees  5.  Increase right shoulder ER to 60 degrees Goal status: progressing 05/11/23  6.  Return to water aerobics and or gym activity Goal status:progressing met 05/18/23  PLAN:  PT FREQUENCY: 1-2x/week  PT DURATION: 12 weeks  PLANNED INTERVENTIONS: Therapeutic exercises, Therapeutic  activity, Neuromuscular re-education, Balance training, Gait training, Patient/Family education, Self Care, Joint mobilization, Dry Needling, Electrical stimulation, Cryotherapy, Vasopneumatic device, and Manual therapy  PLAN FOR NEXT SESSION:   she will see the surgeon in two weeks Jearld Lesch, PT 05/25/2023, 7:59 AM [place] [facility] [address] [place], [place], [address] Phone: [phone]   Fax:  [phone]

## 2023-05-30 ENCOUNTER — Ambulatory Visit: Payer: Medicare Other | Admitting: Physical Therapy

## 2023-05-30 ENCOUNTER — Ambulatory Visit: Payer: Medicare Other | Admitting: Occupational Therapy

## 2023-05-30 ENCOUNTER — Encounter: Payer: Self-pay | Admitting: Physical Therapy

## 2023-05-30 DIAGNOSIS — R519 Headache, unspecified: Secondary | ICD-10-CM | POA: Diagnosis not present

## 2023-05-30 DIAGNOSIS — M25611 Stiffness of right shoulder, not elsewhere classified: Secondary | ICD-10-CM | POA: Diagnosis not present

## 2023-05-30 DIAGNOSIS — M79642 Pain in left hand: Secondary | ICD-10-CM

## 2023-05-30 DIAGNOSIS — R600 Localized edema: Secondary | ICD-10-CM

## 2023-05-30 DIAGNOSIS — M6281 Muscle weakness (generalized): Secondary | ICD-10-CM

## 2023-05-30 DIAGNOSIS — M25641 Stiffness of right hand, not elsewhere classified: Secondary | ICD-10-CM | POA: Diagnosis not present

## 2023-05-30 DIAGNOSIS — R278 Other lack of coordination: Secondary | ICD-10-CM | POA: Diagnosis not present

## 2023-05-30 DIAGNOSIS — G8929 Other chronic pain: Secondary | ICD-10-CM | POA: Diagnosis not present

## 2023-05-30 DIAGNOSIS — M79641 Pain in right hand: Secondary | ICD-10-CM | POA: Diagnosis not present

## 2023-05-30 DIAGNOSIS — M25642 Stiffness of left hand, not elsewhere classified: Secondary | ICD-10-CM

## 2023-05-30 DIAGNOSIS — M542 Cervicalgia: Secondary | ICD-10-CM

## 2023-05-30 DIAGNOSIS — M25511 Pain in right shoulder: Secondary | ICD-10-CM

## 2023-05-30 NOTE — Patient Instructions (Addendum)
Flexor Tendon Gliding (Active Hook Fist)   With fingers and knuckles straight, bend middle and tip joints. Do not bend large knuckles. Repeat _10-15___ times. Do _4-6___ sessions per day.  MP Flexion (Active)   With back of hand on table, bend large knuckles as far as they will go, keeping small joints straight. Repeat _10-15___ times. Do __4-6__ sessions per day. Activity: Reach into a narrow container.*       AROM: PIP Flexion / Extension   Pinch bottom knuckle of ______each__ finger of hand to prevent bending. Actively bend middle knuckle until stretch is felt. Hold __5__ seconds. Relax. Straighten finger as far as possible. Repeat __10-15__ times per set. Do _1__ sessions per day.   AROM: DIP Flexion / Extension   Pinch middle knuckle of ____each____ finger of  hand to prevent bending. Bend end knuckle until stretch is felt. Hold _5___ seconds. Relax. Straighten finger as far as possible. Repeat _10-15___ times per set.  Do _1__ sessions per day.  AROM: Finger Flexion / Extension   Actively bend fingers of  hand. Start with knuckles furthest from palm, and slowly make a fist. Hold __5__ seconds. Relax. Then straighten fingers as far as possible. Repeat _10-15___ times per set.  Do _1-2__ sessions per day.  Copyright  VHI. All rights reserved.     Opposition (Active)   Touch tip of thumb to nail tip of each finger in turn, making an "O" shape. Repeat __10__ times. Do 1_ sessions per day.     MP Flexion (Active)   Bend thumb to touch base of little finger, keeping tip joint straight. Repeat __10-15__ times. Do 1___ sessions per day    Composite Extension (Active)

## 2023-05-30 NOTE — Therapy (Signed)
OUTPATIENT PHYSICAL THERAPY SHOULDER   Patient Name: Veronica Galvan MRN: [id number] DOB:May 28, 1955, 68 y.o., female Today's Date: 05/30/2023  END OF SESSION:  PT End of Session - 05/30/23 1440     Visit Number 46    Date for PT Re-Evaluation 06/02/23    Authorization Type [employer] [place]    PT Start Time 1355    PT Stop Time 1455    PT Time Calculation (min) 60 min    Activity Tolerance Patient tolerated treatment well    Behavior During Therapy WFL for tasks assessed/performed             Past Medical History:  Diagnosis Date   Allergy    Anxiety    Arthritis    hands   Cataract    Diabetes mellitus without complication (HCC)    type 2   Family history of adverse reaction to anesthesia    son has malignant hyperthermia, daughter does not daughter recently had c section without problems   GERD (gastroesophageal reflux disease)    Headache    sinus   Hyperlipidemia    Hypertension    PONV (postoperative nausea and vomiting)    nausea only   Ulcer, stomach peptic yrs ago   Past Surgical History:  Procedure Laterality Date   APPENDECTOMY     both hells bone spur repair     both heels with metal clips   both shoulder rotator cuff repair     CESAREAN SECTION     x 1   COLONOSCOPY WITH PROPOFOL N/A 09/07/2016   Procedure: COLONOSCOPY WITH PROPOFOL;  Surgeon: Charolett Bumpers, MD;  Location: [facility];  Service: Endoscopy;  Laterality: N/A;   colonscopy  06/2011   polyps   EYE SURGERY     FRACTURE SURGERY     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/10/2022   Procedure: REVERSE SHOULDER ARTHROPLASTY;  Surgeon: Francena Hanly, MD;  Location: [facility];  Service: Orthopedics;  Laterality: Right;  Please follow in room 6 if able   TUBAL LIGATION     VESICO-VAGINAL FISTULA REPAIR     Patient Active Problem List   Diagnosis Date Noted   Gastroesophageal reflux disease 11/16/2021   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 08/18/2021    Dermatofibroma 08/18/2021   History of malignant neoplasm of skin 08/18/2021   Lentigo 08/18/2021   Melanocytic nevi of trunk 08/18/2021   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   Rosacea 08/18/2021   Actinic keratosis 08/18/2021   Sensorineural hearing loss (SNHL) of left ear with unrestricted hearing of right ear 07/26/2021   Tinnitus of left ear 07/26/2021   Acute recurrent maxillary sinusitis 06/22/2021   Primary hypertension 01/12/2021   Hyperlipidemia associated with type 2 diabetes mellitus (HCC) 01/12/2021   Arthritis 01/12/2021   Type II diabetes mellitus (HCC) 11/25/2019   Ingrown toenail 09/05/2018   Neck pain 01/29/2018   Tick bite 10/24/2017    PCP: Dayton Scrape, [name]  REFERRING PROVIDER: Rennis Chris, MD  REFERRING DIAG: s/p right reverse TSA  THERAPY DIAG:  Muscle weakness (generalized)  Acute pain of right shoulder  Stiffness of right shoulder, not elsewhere classified  Localized edema  Cervicalgia  Rationale for Evaluation and Treatment: Rehabilitation  ONSET DATE: 11/05/22  SUBJECTIVE:  SUBJECTIVE STATEMENT:  Patient reports feeling blah today, has some increased pain in the shoulder and upper arm and a HA that starts in the right cervical area  PAIN:  Are you having pain? Yes: NPRS scale: 7/10 Pain location: right shoulder, HA Pain description: dull ache at rest, sharp with motions Aggravating factors: quick motions, movements pain up to 10/10 Relieving factors: ice, rest, Tylenol at best 3/10  PRECAUTIONS: Shoulder Protocol in the chart  RED FLAGS: None   WEIGHT BEARING RESTRICTIONS: No  FALLS:  Has patient fallen in last 6 months? Yes. Number of falls 1  LIVING ENVIRONMENT: Lives with: lives alone Lives in: House/apartment Stairs: No Has following  equipment at home: None  OCCUPATION: retired  PLOF: Independent  PATIENT GOALS:dress without difficulty, do hair, have good ROM and less pain  NEXT MD VISIT:   OBJECTIVE:   DIAGNOSTIC FINDINGS:  See above  PATIENT SURVEYS:  FOTO 21  COGNITION: Overall cognitive status: Within functional limits for tasks assessed     SENSATION: WFL  POSTURE: Fwd head, rounded shoulders, elevated and guarded shoulder  UPPER EXTREMITY ROM:   Active ROM Right PROM eval Right PROM Supine 01/05/23 PROM  01/26/23 PROM 02/09/23 AROM sitting 02/14/23 AROM 02/21/23 AROM  03/02/23 AROM  03/22/23 AAROM 03/28/23 AROM Standing 04/13/23 AROM Standing 05/02/23 AROM  Standing 05/11/23 AROM  Standing 05/25/23  Shoulder flexion 35  118  123 90 96 95 100 111 110 112 120 125   Shoulder extension 5               Shoulder abduction 20  90 92  73 80 83 90 93 90 92 98 100  Shoulder adduction                Shoulder internal rotation 15             40  Shoulder external rotation 20  45 30  41  45 50 53 56 56 60 66  Elbow flexion 120               Elbow extension 5               Wrist flexion                Wrist extension                Wrist ulnar deviation                Wrist radial deviation                Wrist pronation                Wrist supination                (Blank rows = not tested)  UPPER EXTREMITY MMT:  No tested due to recent surgery  MMT Right eval Left eval  Shoulder flexion    Shoulder extension    Shoulder abduction    Shoulder adduction    Shoulder internal rotation    Shoulder external rotation    Middle trapezius    Lower trapezius    Elbow flexion    Elbow extension    Wrist flexion    Wrist extension    Wrist ulnar deviation    Wrist radial deviation    Wrist pronation    Wrist supination    Grip strength (lbs)    (Blank rows = not tested)  PALPATION:  Very tight  and tender in the pectoral, upper trap, the entire right upper arm   TODAY'S TREATMENT:                                                                                                                                          DATE:  05/30/23 UBE level 3 x 6 minutes Red tband ER/IR Isometrics all shoulder motions Supine punches and flexion STM to the neck, upper trap, rhomboid and the right upper arm Passive stretch to the right shoulder  Vaso Medium pressure 35 degrees  05/25/23 Nustep level 4 push and pull and then cross body push and pull 3# wate bar extension Body blade 20 seconds all motions 2x20 seconds Passive stretch all motions STM to the right upper arm Vaso medium pressure 34 degrees  05/23/23 Nustep push and pull front to back and across body total 5 minutes Seated row 20# Lats 20# Chest press 5# 25# triceps 10# biceps 3# wate bar overhead press 3# wate bar extension Supine wate bare chest press 3# chest press 3# ER/IR 3# wate bar flexion in supine Passive stretch all motions STM to the right upper arm Vaso medium pressure 34 degrees  05/18/23 UBE level 4 x 4 minutes Nustep push pull and cross body push /pull level 3 Fitter 1 blue band side to side 5# chest press Doorway stretch 3 ways Passive right shoulder stretch all motions except did very light IR according to protocol STM to the right shoulder and upper arm Vaso medium pressure 36 degrees  05/16/23 UBE level 4 x 5 minutes Rows 15# Lats 20# Chest press 5# Small ball throws Yellow tband ER/IR Yellow row and flexion tband  Supine 3# punches 3# isometric circles 3# ER/IR STM to the right upper arm PROM right shoulder Vaso medium pressure  05/11/23 Weight 193.5# Nustep level 5 x 5 minutes Wall slide and circles 15# lat pulls Wall slides PROM right shoulder all motions except IR STM to the right upper arm AROM measured as noted above Vaso medium pressure 34 degrees  05/09/23 Weight 195# Standing UBE level 4 x 4 minutes Triceps 25# 2x15 Biceps 10# 2x15 Fitter 1 blue band  push/pull then side to side Small weighted ball circles with pressure on wall small weighted ball tosses Weighted ball overhead rhythmic stabilization Physioball chest pass and overhead pass Supine 5# chest press small ROM Supine 3# flexion small ROM Supine Er from belly 2# PROM of the shoulder Vaso 34 degrees  05/04/23 Supine 3# wate bard chest press 3# wate bar flexion 3# serratus push 3# isometric circles 2# ER/IR PROM right shoulder  STM to the right upper trap and the right upper arm Vaso right low pressure 34 degrees  05/02/23 UBE level 4 x 5 minutes 20# rows 20# lats 20# triceps Wall slides, circles and overhead side to side 2# wate bar over head lift Yellow tband ER/IR Yellow  tband horizontal abduction PROM right shoulder STM to the right shoulder, very tender in the right biceps and deltoid Vaso 34 degrees right shoulder  04/27/23 UBE level 3.5 x 5 minutes Passive stretch to her tolerance for flexion ER and abduction Supine 3# punches 2# ER from belly 2# flexion from 80 degrees to 100 degrees 4# isometric circles 4# serratus push STM to the neck, the upper trap, rhomboid and right upper arm Vaso 34 degrees low pressure  04/25/23 UBE level 3 x 6 minutes Wall slides, circles and overhead side to side AAROM with stick Yellow tband ER/IR STM to the right shoulder, upper trap and upper arm Vaso 34 degrees  04/20/23 AROM flexion 116 UBE level 4 x 4 minutes 10# straight arm pulls 15# rows 20# lats with assist Supine chest press 3# 3# serratus 3# isometric circles STM to the right upper trap and the rhomboid and right upper arm Vaso right shoulder 35 degrees  04/18/23 UBE level 4 x 5 minutes Wall slides, circles and side to side overhead Supine 2# punches 2# flexion 2# ER/IR 3# isometric circles Passive stretch STM to the neck, upper trap and upper arm Vaso low pressure   PATIENT EDUCATION: Education details: poc/hep Person educated:  Patient Education method: Programmer, multimedia, Facilities manager, Actor cues, Verbal cues, and Handouts Education comprehension: verbalized understanding  HOME EXERCISE PROGRAM: Access Code: [email or web address] URL: [email or web address] Date: 12/20/2022 Prepared by: Stacie Glaze  Exercises - Seated Shoulder Flexion Towel Slide at Table Top  - 2 x daily - 7 x weekly - 2 sets - 10 reps - 3 hold - Seated Shoulder External Rotation PROM on Table  - 2 x daily - 7 x weekly - 2 sets - 10 reps - 3 hold - Seated Elbow Extension and Shoulder External Rotation AAROM at Table with Towel  - 2 x daily - 7 x weekly - 2 sets - 10 reps - 3 hold - Circular Shoulder Pendulum with Table Support  - 2 x daily - 7 x weekly - 2 sets - 10 reps - 3 hold  ASSESSMENT:  CLINICAL IMPRESSION: Patient with a little more pain today and a HA, she is unsure of why, did go to the MD recently and the blood work was negative.  I continue to try to push the ROM which is improving, she just reports fatigues easily and limited strength OBJECTIVE IMPAIRMENTS: cardiopulmonary status limiting activity, decreased activity tolerance, decreased endurance, decreased ROM, decreased strength, increased edema, increased muscle spasms, impaired flexibility, impaired UE functional use, improper body mechanics, postural dysfunction, and pain .  REHAB POTENTIAL: Good  CLINICAL DECISION MAKING: Evolving/moderate complexity  EVALUATION COMPLEXITY: Low   GOALS: Goals reviewed with patient? Yes  SHORT TERM GOALS: Target date: 01/01/23  Independent with initial HEP Goal status: 12/27/22 MET  LONG TERM GOALS: Target date: 03/22/23  Decrease pain 50% Goal status: 03/22/23 progressing  2.  Dress without difficulty Goal status: progressing 05/11/23  3.  Do hair without difficulty Goal status: able to reach the top of head at times progressing 05/25/23  4.  Increase AROM right shoulder flexion to 130 degrees Goal status: 05/25/23 progressing  120 degrees  5.  Increase right shoulder ER to 60 degrees Goal status: progressing 05/11/23  6.  Return to water aerobics and or gym activity Goal status:progressing met 05/18/23  PLAN:  PT FREQUENCY: 1-2x/week  PT DURATION: 12 weeks  PLANNED INTERVENTIONS: Therapeutic exercises, Therapeutic activity, Neuromuscular re-education, Balance training, Gait training, Patient/Family education, Self Care, Joint mobilization,  Dry Needling, Electrical stimulation, Cryotherapy, Vasopneumatic device, and Manual therapy  PLAN FOR NEXT SESSION:   assess/measure Jearld Lesch, PT 05/30/2023, 2:41 PM [place] [facility]. [place], [place], [address] Phone: [phone]   Fax:  [phone]

## 2023-05-30 NOTE — Therapy (Addendum)
OUTPATIENT OCCUPATIONAL THERAPY ORTHO EVALUATION  Patient Name: Veronica Galvan MRN: [id number] DOB:Jan 23, 1956, 68 y.o., female Today's Date: 05/30/2023  PCP: Olive Bass [name] REFERRING PROVIDER: Olive Bass [name]  END OF SESSION:  OT End of Session - 05/30/23 0901     Visit Number 2    Date for OT Re-Evaluation 08/16/23    Authorization Type [employer]    Authorization - Visit Number 2    Progress Note Due on Visit 10    OT Start Time 0801    OT Stop Time 0846    OT Time Calculation (min) 45 min    Activity Tolerance Patient tolerated treatment well    Behavior During Therapy WFL for tasks assessed/performed              Past Medical History:  Diagnosis Date   Allergy    Anxiety    Arthritis    hands   Cataract    Diabetes mellitus without complication (HCC)    type 2   Family history of adverse reaction to anesthesia    son has malignant hyperthermia, daughter does not daughter recently had c section without problems   GERD (gastroesophageal reflux disease)    Headache    sinus   Hyperlipidemia    Hypertension    PONV (postoperative nausea and vomiting)    nausea only   Ulcer, stomach peptic yrs ago   Past Surgical History:  Procedure Laterality Date   APPENDECTOMY     both hells bone spur repair     both heels with metal clips   both shoulder rotator cuff repair     CESAREAN SECTION     x 1   COLONOSCOPY WITH PROPOFOL N/A 09/07/2016   Procedure: COLONOSCOPY WITH PROPOFOL;  Surgeon: Charolett Bumpers, MD;  Location: [facility];  Service: Endoscopy;  Laterality: N/A;   colonscopy  06/2011   polyps   EYE SURGERY     FRACTURE SURGERY     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/10/2022   Procedure: REVERSE SHOULDER ARTHROPLASTY;  Surgeon: Francena Hanly, MD;  Location: [facility];  Service: Orthopedics;  Laterality: Right;  Please follow in room 6 if able   TUBAL LIGATION     VESICO-VAGINAL FISTULA REPAIR     Patient Active Problem  List   Diagnosis Date Noted   Gastroesophageal reflux disease 11/16/2021   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 08/18/2021   Dermatofibroma 08/18/2021   History of malignant neoplasm of skin 08/18/2021   Lentigo 08/18/2021   Melanocytic nevi of trunk 08/18/2021   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   Rosacea 08/18/2021   Actinic keratosis 08/18/2021   Sensorineural hearing loss (SNHL) of left ear with unrestricted hearing of right ear 07/26/2021   Tinnitus of left ear 07/26/2021   Acute recurrent maxillary sinusitis 06/22/2021   Primary hypertension 01/12/2021   Hyperlipidemia associated with type 2 diabetes mellitus (HCC) 01/12/2021   Arthritis 01/12/2021   Type II diabetes mellitus (HCC) 11/25/2019   Ingrown toenail 09/05/2018   Neck pain 01/29/2018   Tick bite 10/24/2017    ONSET DATE: 05/19/23  REFERRING DIAG:  Diagnosis  M79.641,M79.642 (ICD-10-CM) - Bilateral hand pain    THERAPY DIAG:  Muscle weakness (generalized)  Acute pain of right shoulder  Stiffness of right shoulder, not elsewhere classified  Stiffness of left hand, not elsewhere classified  Stiffness of right hand, not elsewhere classified  Other lack of coordination  Pain in left hand  Pain  in right hand  Localized edema  Rationale for Evaluation and Treatment: Rehabilitation  SUBJECTIVE:   SUBJECTIVE STATEMENT: Pt reports difficulty removing a credit card from the credit card reader Pt accompanied by: self  PERTINENT HISTORY: 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 Yes: NPRS scale: 5-8/10 Pain location: bilateral hands Pain description: aching, stiff Aggravating factors: gripping  Relieving factors: heat, meds Pt  also has shoulder pain 4-5/10 which OT will not address as PT is working with pt. to address.  FALLS: Has patient fallen in last 6 months? No  LIVING ENVIRONMENT: Lives with: lives alone Lives in: House/apartment Stairs: yes   PLOF: Independent  PATIENT GOALS: improve functional use of hands and learn adapted strategies for ADLs/IADLs.  NEXT MD VISIT: unknown  OBJECTIVE:  Note: Objective measures were completed at Evaluation unless otherwise noted.  HAND DOMINANCE: Right  ADLs: Overall ADLs: unable to grip credit card to remove from ATM Transfers/ambulation related to ADLs: mod I Eating: drops silverware Grooming: drops toothbrush, uses electric toothbrush, holding comb Upper body dressing: adjusting bra Lower body dressing: difficulty pulling up pants  Toileting: hygeine was difficult initally Bathing: mod I, difficult with shaving Tub shower transfers: walk in shower    FUNCTIONAL OUTCOME MEASURES: Quick Dash: 05/30/23- 75%% disability  UPPER EXTREMITY ROM:   RUE wrist flexion/ extension: 70/ 50, LUE wrist flexion/ extension: 70/ 45 Pt demonstrates grossly 50% composite flexion for right and 555 with left.   Active ROM Right eval Left eval  Thumb MCP (0-60) 45 55  Thumb IP (0-80) 25 10  Thumb Radial abd/add (0-55)     Thumb Palmar abd/add (0-45)     Thumb Opposition to Small Finger yes yes   Index MCP (0-90)     Index PIP (0-100)     Index DIP (0-70)      Long MCP (0-90)      Long PIP (0-100)      Long DIP (0-70)      Ring MCP (0-90)      Ring PIP (0-100)      Ring DIP (0-70)      Little MCP (0-90)      Little PIP (0-100)      Little DIP (0-70)      (Blank rows = not tested)   HAND FUNCTION: Grip strength: Right: TBA lbs; Left: TBA lbs,   SENSATION: Light touch: Impaired index finger LUE  EDEMA: Pt with bony defomities at PIP joints of all digits which pt reports are bone spurs, Pt also has bony deformity at DIP for right thumb, index and 5th  digit and LUE small and index fingers  COGNITION: Overall cognitive status: Within functional limits for tasks assessed   OBSERVATIONS: Pleasant female desires to increase functional use of bilateral UE's   TREATMENT DATE: 05/30/23- Paraffin x 10 mins to bilateral UE's for pain and stiffness, then pt was instucted in A/ROM HEP. See pt. instructions. Therapist began discussing activity modifications and AE. Pt was provided with a foam grip for increased ease with handwriting.  Pt tried using the sock aide however it was more challenging than donning the sock the traditional way.   05/24/23 eval only  PATIENT EDUCATION: Education details:  inital A/ROM HEP, beginning activity modification for handwriting Person educated: Patient Education method: Explanation, demonstration, handout Education comprehension: verbalized understanding, returned demonstration  HOME EXERCISE PROGRAM: n/a  GOALS: Goals reviewed with patient? Yes  SHORT TERM GOALS: Target date: 06/24/23  I with HEP  Goal status:  ongoing  2.  I with positioning/ splinting prn to minimize pain and defomity   Goal status: INITIAL  3.  Pt will increase bilateral grip strength by 5 lbs for increased UE functional use. Baseline: 05/30/23- RUE 8 lbs, LUE 4 lbs Goal status: INITIAL    LONG TERM GOALS: Target date: 08/16/23  I with updated HEP  Goal status: INITIAL  2.  Pt will improve Quick Dash score to 70% disability Baseline: 75% (05/30/23) Goal status: INITIAL  3.  Pt will demonstrate at least 65% composte flexion for LUE for increased functional use.  Goal status: INITIAL  4.  I with adapted strategies/ adapted equipment to minimize pain and to increase pt I with ADLs/IADLs  Goal status: INITIAL  5.  Pt will demonstrate at least 65% composite flexion for RUE for increased  functional use.  Goal status: INITIAL    ASSESSMENT:  CLINICAL IMPRESSION: Patient is progressing towards goals. She demonstrates understanding of inital HEP. PERFORMANCE DEFICITS: in functional skills including ADLs, IADLs, coordination, sensation, edema, ROM, strength, pain, flexibility, Fine motor control, Gross motor control, endurance, decreased knowledge of precautions, decreased knowledge of use of DME, and UE functional use,, and psychosocial skills including coping strategies, environmental adaptation, habits, interpersonal interactions, and routines and behaviors.   IMPAIRMENTS: are limiting patient from ADLs, IADLs, rest and sleep, education, play, leisure, and social participation.   COMORBIDITIES: may have co-morbidities  that affects occupational performance. Patient will benefit from skilled OT to address above impairments and improve overall function.  MODIFICATION OR ASSISTANCE TO COMPLETE EVALUATION: No modification of tasks or assist necessary to complete an evaluation.  OT OCCUPATIONAL PROFILE AND HISTORY: Detailed assessment: Review of records and additional review of physical, cognitive, psychosocial history related to current functional performance.  CLINICAL DECISION MAKING: LOW - limited treatment options, no task modification necessary  REHAB POTENTIAL: Good  EVALUATION COMPLEXITY: Low      PLAN:  OT FREQUENCY: 2x/week plus eval  OT DURATION: 12 weeks  PLANNED INTERVENTIONS: 97168 OT Re-evaluation, 97535 self care/ADL training, [address] therapeutic exercise, 97530 therapeutic activity, 97112 neuromuscular re-education, 97140 manual therapy, 97113 aquatic therapy, 97035 ultrasound, 97018 paraffin, 60454 fluidotherapy, 97010 moist heat, 97010 cryotherapy, 97034 contrast bath, 97760 Orthotics management and training, 09811 Splinting (initial encounter), [id number] Subsequent splinting/medication, passive range of motion, energy conservation, coping strategies  training, patient/family education, and DME and/or AE instructions  RECOMMENDED OTHER SERVICES: none  CONSULTED AND AGREED WITH PLAN OF CARE: Patient  PLAN FOR NEXT SESSION: paraffin, gentle ROM, continue AE/ strategies,    Ryken Paschal, OT 05/30/2023, 9:04 AM

## 2023-06-01 ENCOUNTER — Ambulatory Visit: Payer: Medicare Other | Admitting: Physical Therapy

## 2023-06-01 ENCOUNTER — Encounter: Payer: Self-pay | Admitting: Physical Therapy

## 2023-06-01 DIAGNOSIS — R600 Localized edema: Secondary | ICD-10-CM

## 2023-06-01 DIAGNOSIS — R519 Headache, unspecified: Secondary | ICD-10-CM | POA: Diagnosis not present

## 2023-06-01 DIAGNOSIS — M25511 Pain in right shoulder: Secondary | ICD-10-CM

## 2023-06-01 DIAGNOSIS — M6281 Muscle weakness (generalized): Secondary | ICD-10-CM

## 2023-06-01 DIAGNOSIS — M79641 Pain in right hand: Secondary | ICD-10-CM | POA: Diagnosis not present

## 2023-06-01 DIAGNOSIS — M25611 Stiffness of right shoulder, not elsewhere classified: Secondary | ICD-10-CM | POA: Diagnosis not present

## 2023-06-01 DIAGNOSIS — M25641 Stiffness of right hand, not elsewhere classified: Secondary | ICD-10-CM | POA: Diagnosis not present

## 2023-06-01 DIAGNOSIS — G8929 Other chronic pain: Secondary | ICD-10-CM | POA: Diagnosis not present

## 2023-06-01 DIAGNOSIS — M79642 Pain in left hand: Secondary | ICD-10-CM | POA: Diagnosis not present

## 2023-06-01 DIAGNOSIS — R278 Other lack of coordination: Secondary | ICD-10-CM | POA: Diagnosis not present

## 2023-06-01 DIAGNOSIS — M542 Cervicalgia: Secondary | ICD-10-CM | POA: Diagnosis not present

## 2023-06-01 DIAGNOSIS — M25642 Stiffness of left hand, not elsewhere classified: Secondary | ICD-10-CM | POA: Diagnosis not present

## 2023-06-01 NOTE — Therapy (Signed)
OUTPATIENT PHYSICAL THERAPY SHOULDER   Patient Name: Veronica Galvan MRN: [id number] DOB:March 28, 1956, 68 y.o., female Today's Date: 06/01/2023  END OF SESSION:  PT End of Session - 06/01/23 0814     Visit Number 47    Date for PT Re-Evaluation 07/04/23    Authorization Type [employer] [place]    PT Start Time 0800    PT Stop Time 0900    PT Time Calculation (min) 60 min    Activity Tolerance Patient tolerated treatment well    Behavior During Therapy WFL for tasks assessed/performed             Past Medical History:  Diagnosis Date   Allergy    Anxiety    Arthritis    hands   Cataract    Diabetes mellitus without complication (HCC)    type 2   Family history of adverse reaction to anesthesia    son has malignant hyperthermia, daughter does not daughter recently had c section without problems   GERD (gastroesophageal reflux disease)    Headache    sinus   Hyperlipidemia    Hypertension    PONV (postoperative nausea and vomiting)    nausea only   Ulcer, stomach peptic yrs ago   Past Surgical History:  Procedure Laterality Date   APPENDECTOMY     both hells bone spur repair     both heels with metal clips   both shoulder rotator cuff repair     CESAREAN SECTION     x 1   COLONOSCOPY WITH PROPOFOL N/A 09/07/2016   Procedure: COLONOSCOPY WITH PROPOFOL;  Surgeon: Charolett Bumpers, MD;  Location: [facility];  Service: Endoscopy;  Laterality: N/A;   colonscopy  06/2011   polyps   EYE SURGERY     FRACTURE SURGERY     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/10/2022   Procedure: REVERSE SHOULDER ARTHROPLASTY;  Surgeon: Francena Hanly, MD;  Location: [facility];  Service: Orthopedics;  Laterality: Right;  Please follow in room 6 if able   TUBAL LIGATION     VESICO-VAGINAL FISTULA REPAIR     Patient Active Problem List   Diagnosis Date Noted   Gastroesophageal reflux disease 11/16/2021   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 08/18/2021    Dermatofibroma 08/18/2021   History of malignant neoplasm of skin 08/18/2021   Lentigo 08/18/2021   Melanocytic nevi of trunk 08/18/2021   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   Rosacea 08/18/2021   Actinic keratosis 08/18/2021   Sensorineural hearing loss (SNHL) of left ear with unrestricted hearing of right ear 07/26/2021   Tinnitus of left ear 07/26/2021   Acute recurrent maxillary sinusitis 06/22/2021   Primary hypertension 01/12/2021   Hyperlipidemia associated with type 2 diabetes mellitus (HCC) 01/12/2021   Arthritis 01/12/2021   Type II diabetes mellitus (HCC) 11/25/2019   Ingrown toenail 09/05/2018   Neck pain 01/29/2018   Tick bite 10/24/2017    PCP: Dayton Scrape, [name]  REFERRING PROVIDER: Rennis Chris, MD  REFERRING DIAG: s/p right reverse TSA  THERAPY DIAG:  Muscle weakness (generalized)  Localized edema  Acute pain of right shoulder  Stiffness of right shoulder, not elsewhere classified  Cervicalgia  Rationale for Evaluation and Treatment: Rehabilitation  ONSET DATE: 11/05/22  SUBJECTIVE:  SUBJECTIVE STATEMENT:  Reports did not sleep, has some anxiety about going and caring for grandkids today and through the weekend  PAIN:  Are you having pain? Yes: NPRS scale: 7/10 Pain location: right shoulder, HA Pain description: dull ache at rest, sharp with motions Aggravating factors: quick motions, movements pain up to 10/10 Relieving factors: ice, rest, Tylenol at best 3/10  PRECAUTIONS: Shoulder Protocol in the chart  RED FLAGS: None   WEIGHT BEARING RESTRICTIONS: No  FALLS:  Has patient fallen in last 6 months? Yes. Number of falls 1  LIVING ENVIRONMENT: Lives with: lives alone Lives in: House/apartment Stairs: No Has following equipment at home:  None  OCCUPATION: retired  PLOF: Independent  PATIENT GOALS:dress without difficulty, do hair, have good ROM and less pain  NEXT MD VISIT:   OBJECTIVE:   DIAGNOSTIC FINDINGS:  See above  PATIENT SURVEYS:  FOTO 21  COGNITION: Overall cognitive status: Within functional limits for tasks assessed     SENSATION: WFL  POSTURE: Fwd head, rounded shoulders, elevated and guarded shoulder  UPPER EXTREMITY ROM:   Active ROM Right PROM eval Right PROM Supine 01/05/23 PROM  01/26/23 PROM 02/09/23 AROM sitting 02/14/23 AROM 02/21/23 AROM  03/02/23 AROM  03/22/23 AAROM 03/28/23 AROM Standing 04/13/23 AROM Standing 05/02/23 AROM  Standing 05/11/23 AROM  Standing 05/25/23 AROM Standing 06/01/23  Shoulder flexion 35  118  123 90 96 95 100 111 110 112 120 125  129  Shoulder extension 5                Shoulder abduction 20  90 92  73 80 83 90 93 90 92 98 100 102   Shoulder adduction                 Shoulder internal rotation 15             40 40  Shoulder external rotation 20  45 30  41  45 50 53 56 56 60 66 67  Elbow flexion 120                Elbow extension 5                Wrist flexion                 Wrist extension                 Wrist ulnar deviation                 Wrist radial deviation                 Wrist pronation                 Wrist supination                 (Blank rows = not tested)  UPPER EXTREMITY MMT:  No tested due to recent surgery  MMT Right eval Left eval  Shoulder flexion    Shoulder extension    Shoulder abduction    Shoulder adduction    Shoulder internal rotation    Shoulder external rotation    Middle trapezius    Lower trapezius    Elbow flexion    Elbow extension    Wrist flexion    Wrist extension    Wrist ulnar deviation    Wrist radial deviation    Wrist pronation    Wrist supination    Grip strength (lbs)    (  Blank rows = not tested)  PALPATION:  Very tight and tender in the pectoral, upper trap, the entire right upper  arm   TODAY'S TREATMENT:                                                                                                                                         DATE:  06/01/23 UBE LEvel 4 x 6 minutes Wall slides, circles Supine 3# wate bar chest press and then flexion 3# isometric circles 3# serratus punch Passive stretch right shoulder all motions ROM measured as above Vaso medium pressure 34 degrees  05/30/23 UBE level 3 x 6 minutes Red tband ER/IR Isometrics all shoulder motions Supine punches and flexion STM to the neck, upper trap, rhomboid and the right upper arm Passive stretch to the right shoulder  Vaso Medium pressure 35 degrees  05/25/23 Nustep level 4 push and pull and then cross body push and pull 3# wate bar extension Body blade 20 seconds all motions 2x20 seconds Passive stretch all motions STM to the right upper arm Vaso medium pressure 34 degrees  05/23/23 Nustep push and pull front to back and across body total 5 minutes Seated row 20# Lats 20# Chest press 5# 25# triceps 10# biceps 3# wate bar overhead press 3# wate bar extension Supine wate bare chest press 3# chest press 3# ER/IR 3# wate bar flexion in supine Passive stretch all motions STM to the right upper arm Vaso medium pressure 34 degrees  05/18/23 UBE level 4 x 4 minutes Nustep push pull and cross body push /pull level 3 Fitter 1 blue band side to side 5# chest press Doorway stretch 3 ways Passive right shoulder stretch all motions except did very light IR according to protocol STM to the right shoulder and upper arm Vaso medium pressure 36 degrees  05/16/23 UBE level 4 x 5 minutes Rows 15# Lats 20# Chest press 5# Small ball throws Yellow tband ER/IR Yellow row and flexion tband  Supine 3# punches 3# isometric circles 3# ER/IR STM to the right upper arm PROM right shoulder Vaso medium pressure  05/11/23 Weight 193.5# Nustep level 5 x 5 minutes Wall slide and circles 15#  lat pulls Wall slides PROM right shoulder all motions except IR STM to the right upper arm AROM measured as noted above Vaso medium pressure 34 degrees  05/09/23 Weight 195# Standing UBE level 4 x 4 minutes Triceps 25# 2x15 Biceps 10# 2x15 Fitter 1 blue band push/pull then side to side Small weighted ball circles with pressure on wall small weighted ball tosses Weighted ball overhead rhythmic stabilization Physioball chest pass and overhead pass Supine 5# chest press small ROM Supine 3# flexion small ROM Supine Er from belly 2# PROM of the shoulder Vaso 34 degrees  05/04/23 Supine 3# wate bard chest press 3# wate bar flexion 3# serratus push 3# isometric circles 2# ER/IR PROM right  shoulder  STM to the right upper trap and the right upper arm Vaso right low pressure 34 degrees  05/02/23 UBE level 4 x 5 minutes 20# rows 20# lats 20# triceps Wall slides, circles and overhead side to side 2# wate bar over head lift Yellow tband ER/IR Yellow tband horizontal abduction PROM right shoulder STM to the right shoulder, very tender in the right biceps and deltoid Vaso 34 degrees right shoulder   PATIENT EDUCATION: Education details: poc/hep Person educated: Patient Education method: Programmer, multimedia, Facilities manager, Actor cues, Verbal cues, and Handouts Education comprehension: verbalized understanding  HOME EXERCISE PROGRAM: Access Code: [email or web address] URL: [email or web address] Date: 12/20/2022 Prepared by: Stacie Glaze  Exercises - Seated Shoulder Flexion Towel Slide at Table Top  - 2 x daily - 7 x weekly - 2 sets - 10 reps - 3 hold - Seated Shoulder External Rotation PROM on Table  - 2 x daily - 7 x weekly - 2 sets - 10 reps - 3 hold - Seated Elbow Extension and Shoulder External Rotation AAROM at Table with Towel  - 2 x daily - 7 x weekly - 2 sets - 10 reps - 3 hold - Circular Shoulder Pendulum with Table Support  - 2 x daily - 7 x weekly - 2 sets - 10  reps - 3 hold  ASSESSMENT:  CLINICAL IMPRESSION: Patient has had some increased pain and soreness lately.  She however continues to improve in her AROM, She does have difficulty with strength, again she is extremely active and I do feel that she overdid it from November to January 1.  She seemed to have hit a wall and started to have more pain in the right shoulder, upper arm and the neck and back.  She did see the GP and her bloodwork was within normal ranges OBJECTIVE IMPAIRMENTS: cardiopulmonary status limiting activity, decreased activity tolerance, decreased endurance, decreased ROM, decreased strength, increased edema, increased muscle spasms, impaired flexibility, impaired UE functional use, improper body mechanics, postural dysfunction, and pain .  REHAB POTENTIAL: Good  CLINICAL DECISION MAKING: Evolving/moderate complexity  EVALUATION COMPLEXITY: Low   GOALS: Goals reviewed with patient? Yes  SHORT TERM GOALS: Target date: 01/01/23  Independent with initial HEP Goal status: 12/27/22 MET  LONG TERM GOALS: Target date: 03/22/23  Decrease pain 50% Goal status: 03/22/23 progressing  2.  Dress without difficulty Goal status: progressing 05/11/23  3.  Do hair without difficulty Goal status: able to reach the top of head at times progressing 05/25/23  4.  Increase AROM right shoulder flexion to 130 degrees Goal status: 06/01/23 progressing 120 degrees  5.  Increase right shoulder ER to 60 degrees Goal status: progressing 06/01/23  6.  Return to water aerobics and or gym activity Goal status:progressing met 06/01/23  PLAN:  PT FREQUENCY: 1-2x/week  PT DURATION: 12 weeks  PLANNED INTERVENTIONS: Therapeutic exercises, Therapeutic activity, Neuromuscular re-education, Balance training, Gait training, Patient/Family education, Self Care, Joint mobilization, Dry Needling, Electrical stimulation, Cryotherapy, Vasopneumatic device, and Manual therapy  PLAN FOR NEXT SESSION:   see  what MD says Jearld Lesch, PT 06/01/2023, 8:21 AM [place] [facility] at [facility]. [place], [place], [address] Phone: [phone]   Fax:  [phone]

## 2023-06-05 DIAGNOSIS — Z96611 Presence of right artificial shoulder joint: Secondary | ICD-10-CM | POA: Diagnosis not present

## 2023-06-05 DIAGNOSIS — S42291D Other displaced fracture of upper end of right humerus, subsequent encounter for fracture with routine healing: Secondary | ICD-10-CM | POA: Diagnosis not present

## 2023-06-06 ENCOUNTER — Encounter: Payer: Self-pay | Admitting: Physical Therapy

## 2023-06-06 ENCOUNTER — Ambulatory Visit: Payer: Medicare Other | Attending: Family | Admitting: Physical Therapy

## 2023-06-06 ENCOUNTER — Ambulatory Visit (HOSPITAL_BASED_OUTPATIENT_CLINIC_OR_DEPARTMENT_OTHER)
Admission: RE | Admit: 2023-06-06 | Discharge: 2023-06-06 | Disposition: A | Discharge status: Home / Self Care | Payer: Medicare Other | Source: Ambulatory Visit | Attending: Family | Admitting: Family

## 2023-06-06 ENCOUNTER — Encounter (HOSPITAL_BASED_OUTPATIENT_CLINIC_OR_DEPARTMENT_OTHER): Payer: Self-pay

## 2023-06-06 DIAGNOSIS — Z1231 Encounter for screening mammogram for malignant neoplasm of breast: Secondary | ICD-10-CM | POA: Insufficient documentation

## 2023-06-06 DIAGNOSIS — M25511 Pain in right shoulder: Secondary | ICD-10-CM | POA: Diagnosis not present

## 2023-06-06 DIAGNOSIS — M79641 Pain in right hand: Secondary | ICD-10-CM | POA: Diagnosis not present

## 2023-06-06 DIAGNOSIS — M542 Cervicalgia: Secondary | ICD-10-CM | POA: Insufficient documentation

## 2023-06-06 DIAGNOSIS — R600 Localized edema: Secondary | ICD-10-CM | POA: Diagnosis not present

## 2023-06-06 DIAGNOSIS — G8929 Other chronic pain: Secondary | ICD-10-CM | POA: Insufficient documentation

## 2023-06-06 DIAGNOSIS — M6281 Muscle weakness (generalized): Secondary | ICD-10-CM | POA: Diagnosis not present

## 2023-06-06 DIAGNOSIS — M25641 Stiffness of right hand, not elsewhere classified: Secondary | ICD-10-CM | POA: Insufficient documentation

## 2023-06-06 DIAGNOSIS — M79642 Pain in left hand: Secondary | ICD-10-CM | POA: Diagnosis not present

## 2023-06-06 DIAGNOSIS — M25611 Stiffness of right shoulder, not elsewhere classified: Secondary | ICD-10-CM | POA: Diagnosis not present

## 2023-06-06 DIAGNOSIS — R278 Other lack of coordination: Secondary | ICD-10-CM | POA: Insufficient documentation

## 2023-06-06 DIAGNOSIS — R519 Headache, unspecified: Secondary | ICD-10-CM | POA: Insufficient documentation

## 2023-06-06 DIAGNOSIS — M25642 Stiffness of left hand, not elsewhere classified: Secondary | ICD-10-CM | POA: Diagnosis not present

## 2023-06-06 NOTE — Therapy (Signed)
 OUTPATIENT PHYSICAL THERAPY SHOULDER   Patient Name: Veronica Galvan MRN: [id number] DOB:Jun 11, 1955, 68 y.o., female Today's Date: 06/06/2023  END OF SESSION:  PT End of Session - 06/06/23 1310     Visit Number 48    Date for PT Re-Evaluation 07/04/23    Authorization Type [employer]    PT Start Time 1311    PT Stop Time 1410    PT Time Calculation (min) 59 min    Activity Tolerance Patient tolerated treatment well    Behavior During Therapy WFL for tasks assessed/performed             Past Medical History:  Diagnosis Date   Allergy     Anxiety    Arthritis    hands   Cataract    Diabetes mellitus without complication (HCC)    type 2   Family history of adverse reaction to anesthesia    son has malignant hyperthermia, daughter does not daughter recently had c section without problems   GERD (gastroesophageal reflux disease)    Headache    sinus   Hyperlipidemia    Hypertension    PONV (postoperative nausea and vomiting)    nausea only   Ulcer, stomach peptic yrs ago   Past Surgical History:  Procedure Laterality Date   APPENDECTOMY     both hells bone spur repair     both heels with metal clips   both shoulder rotator cuff repair     CESAREAN SECTION     x 1   COLONOSCOPY WITH PROPOFOL  N/A 09/07/2016   Procedure: COLONOSCOPY WITH PROPOFOL ;  Surgeon: Vicci Gladis POUR, MD;  Location: [facility];  Service: Endoscopy;  Laterality: N/A;   colonscopy  06/2011   polyps   EYE SURGERY     FRACTURE SURGERY     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/10/2022   Procedure: REVERSE SHOULDER ARTHROPLASTY;  Surgeon: Melita Drivers, MD;  Location: [facility];  Service: Orthopedics;  Laterality: Right;  Please follow in room 6 if able   TUBAL LIGATION     VESICO-VAGINAL FISTULA REPAIR     Patient Active Problem List   Diagnosis Date Noted   Gastroesophageal reflux disease 11/16/2021   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 08/18/2021    Dermatofibroma 08/18/2021   History of malignant neoplasm of skin 08/18/2021   Lentigo 08/18/2021   Melanocytic nevi of trunk 08/18/2021   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   Rosacea 08/18/2021   Actinic keratosis 08/18/2021   Sensorineural hearing loss (SNHL) of left ear with unrestricted hearing of right ear 07/26/2021   Tinnitus of left ear 07/26/2021   Acute recurrent maxillary sinusitis 06/22/2021   Primary hypertension 01/12/2021   Hyperlipidemia associated with type 2 diabetes mellitus (HCC) 01/12/2021   Arthritis 01/12/2021   Type II diabetes mellitus (HCC) 11/25/2019   Ingrown toenail 09/05/2018   Neck pain 01/29/2018   Tick bite 10/24/2017    PCP: [name], [name]  REFERRING PROVIDER: Melita, MD  REFERRING DIAG: s/p right reverse TSA  THERAPY DIAG:  Muscle weakness (generalized)  Localized edema  Acute pain of right shoulder  Stiffness of right shoulder, not elsewhere classified  Cervicalgia  Rationale for Evaluation and Treatment: Rehabilitation  ONSET DATE: 11/05/22  SUBJECTIVE:  SUBJECTIVE STATEMENT:  Patient had a very long weekend, has a lot more pain all over and feels like she is run ragged.  She took care of grandkids on her own all weekend  PAIN:  Are you having pain? Yes: NPRS scale: 7/10 Pain location: right shoulder, HA Pain description: dull ache at rest, sharp with motions Aggravating factors: quick motions, movements pain up to 10/10 Relieving factors: ice, rest, Tylenol  at best 3/10  PRECAUTIONS: Shoulder Protocol in the chart  RED FLAGS: None   WEIGHT BEARING RESTRICTIONS: No  FALLS:  Has patient fallen in last 6 months? Yes. Number of falls 1  LIVING ENVIRONMENT: Lives with: lives alone Lives in: House/apartment Stairs: No Has following  equipment at home: None  OCCUPATION: retired  PLOF: Independent  PATIENT GOALS:dress without difficulty, do hair, have good ROM and less pain  NEXT MD VISIT:   OBJECTIVE:   DIAGNOSTIC FINDINGS:  See above  PATIENT SURVEYS:  FOTO 21  COGNITION: Overall cognitive status: Within functional limits for tasks assessed     SENSATION: WFL  POSTURE: Fwd head, rounded shoulders, elevated and guarded shoulder  UPPER EXTREMITY ROM:   Active ROM Right PROM eval Right PROM Supine 01/05/23 PROM  01/26/23 PROM 02/09/23 AROM sitting 02/14/23 AROM 02/21/23 AROM  03/02/23 AROM  03/22/23 AAROM 03/28/23 AROM Standing 04/13/23 AROM Standing 05/02/23 AROM  Standing 05/11/23 AROM  Standing 05/25/23 AROM Standing 06/01/23  Shoulder flexion 35  118  123 90 96 95 100 111 110 112 120 125  129  Shoulder extension 5                Shoulder abduction 20  90 92  73 80 83 90 93 90 92 98 100 102   Shoulder adduction                 Shoulder internal rotation 15             40 40  Shoulder external rotation 20  45 30  41  45 50 53 56 56 60 66 67  Elbow flexion 120                Elbow extension 5                Wrist flexion                 Wrist extension                 Wrist ulnar deviation                 Wrist radial deviation                 Wrist pronation                 Wrist supination                 (Blank rows = not tested)  UPPER EXTREMITY MMT:  No tested due to recent surgery  MMT Right eval Left eval  Shoulder flexion    Shoulder extension    Shoulder abduction    Shoulder adduction    Shoulder internal rotation    Shoulder external rotation    Middle trapezius    Lower trapezius    Elbow flexion    Elbow extension    Wrist flexion    Wrist extension    Wrist ulnar deviation    Wrist radial deviation    Wrist pronation  Wrist supination    Grip strength (lbs)    (Blank rows = not tested)  PALPATION:  Very tight and tender in the pectoral, upper trap, the  entire right upper arm   TODAY'S TREATMENT:                                                                                                                                         DATE:  06/06/23 UBE level 4 x 6 minutes PROM all motions in supine, included cross body stretch and star gazer STM to the right upper arm Vaso medium pressure  06/01/23 UBE LEvel 4 x 6 minutes Wall slides, circles Supine 3# wate bar chest press and then flexion 3# isometric circles 3# serratus punch Passive stretch right shoulder all motions ROM measured as above Vaso medium pressure 34 degrees  05/30/23 UBE level 3 x 6 minutes Red tband ER/IR Isometrics all shoulder motions Supine punches and flexion STM to the neck, upper trap, rhomboid and the right upper arm Passive stretch to the right shoulder  Vaso Medium pressure 35 degrees  05/25/23 Nustep level 4 push and pull and then cross body push and pull 3# wate bar extension Body blade 20 seconds all motions 2x20 seconds Passive stretch all motions STM to the right upper arm Vaso medium pressure 34 degrees  05/23/23 Nustep push and pull front to back and across body total 5 minutes Seated row 20# Lats 20# Chest press 5# 25# triceps 10# biceps 3# wate bar overhead press 3# wate bar extension Supine wate bare chest press 3# chest press 3# ER/IR 3# wate bar flexion in supine Passive stretch all motions STM to the right upper arm Vaso medium pressure 34 degrees  05/18/23 UBE level 4 x 4 minutes Nustep push pull and cross body push /pull level 3 Fitter 1 blue band side to side 5# chest press Doorway stretch 3 ways Passive right shoulder stretch all motions except did very light IR according to protocol STM to the right shoulder and upper arm Vaso medium pressure 36 degrees  05/16/23 UBE level 4 x 5 minutes Rows 15# Lats 20# Chest press 5# Small ball throws Yellow tband ER/IR Yellow row and flexion tband  Supine 3# punches 3#  isometric circles 3# ER/IR STM to the right upper arm PROM right shoulder Vaso medium pressure  05/11/23 Weight 193.5# Nustep level 5 x 5 minutes Wall slide and circles 15# lat pulls Wall slides PROM right shoulder all motions except IR STM to the right upper arm AROM measured as noted above Vaso medium pressure 34 degrees  05/09/23 Weight 195# Standing UBE level 4 x 4 minutes Triceps 25# 2x15 Biceps 10# 2x15 Fitter 1 blue band push/pull then side to side Small weighted ball circles with pressure on wall small weighted ball tosses Weighted ball overhead rhythmic stabilization Physioball chest pass and overhead pass Supine 5# chest press small  ROM Supine 3# flexion small ROM Supine Er from belly 2# PROM of the shoulder Vaso 34 degrees  PATIENT EDUCATION: Education details: poc/hep Person educated: Patient Education method: Explanation, Facilities Manager, Actor cues, Verbal cues, and Handouts Education comprehension: verbalized understanding  HOME EXERCISE PROGRAM: Access Code: [email or web address] URL: [email or web address] Date: 12/20/2022 Prepared by: Ozell Mainland  Exercises - Seated Shoulder Flexion Towel Slide at Table Top  - 2 x daily - 7 x weekly - 2 sets - 10 reps - 3 hold - Seated Shoulder External Rotation PROM on Table  - 2 x daily - 7 x weekly - 2 sets - 10 reps - 3 hold - Seated Elbow Extension and Shoulder External Rotation AAROM at Table with Towel  - 2 x daily - 7 x weekly - 2 sets - 10 reps - 3 hold - Circular Shoulder Pendulum with Table Support  - 2 x daily - 7 x weekly - 2 sets - 10 reps - 3 hold  ASSESSMENT:  CLINICAL IMPRESSION: Patient has increased pain and soreness due to taking care of her grandkids over the weekend on her own, she reports sore all over and very tight, she does report seeing the surgeon yesterday and that he has released her to some extent, will see her in 6 months. He agreed that he feels that she has been over doing it.   She seemed to have hit a wall and started to have more pain in the right shoulder, upper arm and the neck and back.  OBJECTIVE IMPAIRMENTS: cardiopulmonary status limiting activity, decreased activity tolerance, decreased endurance, decreased ROM, decreased strength, increased edema, increased muscle spasms, impaired flexibility, impaired UE functional use, improper body mechanics, postural dysfunction, and pain .  REHAB POTENTIAL: Good  CLINICAL DECISION MAKING: Evolving/moderate complexity  EVALUATION COMPLEXITY: Low   GOALS: Goals reviewed with patient? Yes  SHORT TERM GOALS: Target date: 01/01/23  Independent with initial HEP Goal status: 12/27/22 MET  LONG TERM GOALS: Target date: 03/22/23  Decrease pain 50% Goal status: 03/22/23 progressing  2.  Dress without difficulty Goal status: progressing 05/11/23  3.  Do hair without difficulty Goal status: able to reach the top of head at times progressing 05/25/23  4.  Increase AROM right shoulder flexion to 130 degrees Goal status: 06/01/23 progressing 120 degrees  5.  Increase right shoulder ER to 60 degrees Goal status: progressing 06/01/23  6.  Return to water  aerobics and or gym activity Goal status:progressing met 06/01/23  PLAN:  PT FREQUENCY: 1-2x/week  PT DURATION: 12 weeks  PLANNED INTERVENTIONS: Therapeutic exercises, Therapeutic activity, Neuromuscular re-education, Balance training, Gait training, Patient/Family education, Self Care, Joint mobilization, Dry Needling, Electrical stimulation, Cryotherapy, Vasopneumatic device, and Manual therapy  PLAN FOR NEXT SESSION:   focus more on ROM and decrease the weights as she is so active MAINLAND OZELL ORN, PT 06/06/2023, 1:11 PM [place] [facility] at [facility] [place], [place], [address] Phone: [phone]   Fax:  [phone]

## 2023-06-07 ENCOUNTER — Ambulatory Visit: Payer: Medicare Other | Admitting: Occupational Therapy

## 2023-06-07 DIAGNOSIS — M79641 Pain in right hand: Secondary | ICD-10-CM | POA: Diagnosis not present

## 2023-06-07 DIAGNOSIS — M25611 Stiffness of right shoulder, not elsewhere classified: Secondary | ICD-10-CM | POA: Diagnosis not present

## 2023-06-07 DIAGNOSIS — M25642 Stiffness of left hand, not elsewhere classified: Secondary | ICD-10-CM | POA: Diagnosis not present

## 2023-06-07 DIAGNOSIS — M6281 Muscle weakness (generalized): Secondary | ICD-10-CM | POA: Diagnosis not present

## 2023-06-07 DIAGNOSIS — R519 Headache, unspecified: Secondary | ICD-10-CM | POA: Diagnosis not present

## 2023-06-07 DIAGNOSIS — G8929 Other chronic pain: Secondary | ICD-10-CM | POA: Diagnosis not present

## 2023-06-07 DIAGNOSIS — M79642 Pain in left hand: Secondary | ICD-10-CM

## 2023-06-07 DIAGNOSIS — R278 Other lack of coordination: Secondary | ICD-10-CM | POA: Diagnosis not present

## 2023-06-07 DIAGNOSIS — M25511 Pain in right shoulder: Secondary | ICD-10-CM | POA: Diagnosis not present

## 2023-06-07 DIAGNOSIS — M25641 Stiffness of right hand, not elsewhere classified: Secondary | ICD-10-CM

## 2023-06-07 DIAGNOSIS — R600 Localized edema: Secondary | ICD-10-CM | POA: Diagnosis not present

## 2023-06-07 DIAGNOSIS — M542 Cervicalgia: Secondary | ICD-10-CM | POA: Diagnosis not present

## 2023-06-07 NOTE — Therapy (Signed)
 OUTPATIENT OCCUPATIONAL THERAPY ORTHO EVALUATION  Patient Name: Veronica Galvan MRN: [id number] DOB:Aug 12, 1955, 68 y.o., female Today's Date: 06/07/2023  PCP: Leita Eliza Elbe [name] REFERRING PROVIDER: Leita Eliza Elbe [name]  END OF SESSION:  OT End of Session - 06/07/23 1125     Visit Number 3    Number of Visits 25    Date for OT Re-Evaluation 08/16/23    Authorization Type BCBS MCR    Authorization - Visit Number 3    Progress Note Due on Visit 10    OT Start Time 0802    OT Stop Time 0843    OT Time Calculation (min) 41 min    Activity Tolerance Patient tolerated treatment well    Behavior During Therapy WFL for tasks assessed/performed               Past Medical History:  Diagnosis Date   Allergy     Anxiety    Arthritis    hands   Cataract    Diabetes mellitus without complication (HCC)    type 2   Family history of adverse reaction to anesthesia    son has malignant hyperthermia, daughter does not daughter recently had c section without problems   GERD (gastroesophageal reflux disease)    Headache    sinus   Hyperlipidemia    Hypertension    PONV (postoperative nausea and vomiting)    nausea only   Ulcer, stomach peptic yrs ago   Past Surgical History:  Procedure Laterality Date   APPENDECTOMY     both hells bone spur repair     both heels with metal clips   both shoulder rotator cuff repair     CESAREAN SECTION     x 1   COLONOSCOPY WITH PROPOFOL  N/A 09/07/2016   Procedure: COLONOSCOPY WITH PROPOFOL ;  Surgeon: Vicci Gladis POUR, MD;  Location: [facility];  Service: Endoscopy;  Laterality: N/A;   colonscopy  06/2011   polyps   EYE SURGERY     FRACTURE SURGERY     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/10/2022   Procedure: REVERSE SHOULDER ARTHROPLASTY;  Surgeon: Melita Drivers, MD;  Location: [facility];  Service: Orthopedics;  Laterality: Right;  Please follow in room 6 if able   TUBAL LIGATION     VESICO-VAGINAL FISTULA REPAIR      Patient Active Problem List   Diagnosis Date Noted   Gastroesophageal reflux disease 11/16/2021   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 08/18/2021   Dermatofibroma 08/18/2021   History of malignant neoplasm of skin 08/18/2021   Lentigo 08/18/2021   Melanocytic nevi of trunk 08/18/2021   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   Rosacea 08/18/2021   Actinic keratosis 08/18/2021   Sensorineural hearing loss (SNHL) of left ear with unrestricted hearing of right ear 07/26/2021   Tinnitus of left ear 07/26/2021   Acute recurrent maxillary sinusitis 06/22/2021   Primary hypertension 01/12/2021   Hyperlipidemia associated with type 2 diabetes mellitus (HCC) 01/12/2021   Arthritis 01/12/2021   Type II diabetes mellitus (HCC) 11/25/2019   Ingrown toenail 09/05/2018   Neck pain 01/29/2018   Tick bite 10/24/2017    ONSET DATE: 05/19/23  REFERRING DIAG:  Diagnosis  M79.641,M79.642 (ICD-10-CM) - Bilateral hand pain    THERAPY DIAG:  Muscle weakness (generalized)  Stiffness of left hand, not elsewhere classified  Stiffness of right hand, not elsewhere classified  Other lack of coordination  Pain in left hand  Pain in right hand  Rationale for  Evaluation and Treatment: Rehabilitation  SUBJECTIVE:   SUBJECTIVE STATEMENT: Pt reports it was a difficult weekend caring for her grandchildren Pt accompanied by: self  PERTINENT HISTORY: 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 Yes: NPRS scale: 5-/10 Pain location: bilateral hands Pain description: aching, stiff Aggravating factors: gripping  Relieving factors: heat, meds Pt also has shoulder pain 4-5/10 which OT will not address as PT is working with pt. to  address.  FALLS: Has patient fallen in last 6 months? No  LIVING ENVIRONMENT: Lives with: lives alone Lives in: House/apartment Stairs: yes   PLOF: Independent  PATIENT GOALS: improve functional use of hands and learn adapted strategies for ADLs/IADLs.  NEXT MD VISIT: unknown  OBJECTIVE:  Note: Objective measures were completed at Evaluation unless otherwise noted.  HAND DOMINANCE: Right  ADLs: Overall ADLs: unable to grip credit card to remove from ATM Transfers/ambulation related to ADLs: mod I Eating: drops silverware Grooming: drops toothbrush, uses electric toothbrush, holding comb Upper body dressing: adjusting bra Lower body dressing: difficulty pulling up pants  Toileting: hygeine was difficult initally Bathing: mod I, difficult with shaving Tub shower transfers: walk in shower    FUNCTIONAL OUTCOME MEASURES: Quick Dash: 05/30/23- 75%% disability  UPPER EXTREMITY ROM:   RUE wrist flexion/ extension: 70/ 50, LUE wrist flexion/ extension: 70/ 45 Pt demonstrates grossly 50% composite flexion for right and 555 with left.   Active ROM Right eval Left eval  Thumb MCP (0-60) 45 55  Thumb IP (0-80) 25 10  Thumb Radial abd/add (0-55)     Thumb Palmar abd/add (0-45)     Thumb Opposition to Small Finger yes yes   Index MCP (0-90)     Index PIP (0-100)     Index DIP (0-70)      Long MCP (0-90)      Long PIP (0-100)      Long DIP (0-70)      Ring MCP (0-90)      Ring PIP (0-100)      Ring DIP (0-70)      Little MCP (0-90)      Little PIP (0-100)      Little DIP (0-70)      (Blank rows = not tested)   HAND FUNCTION: Grip strength: Right: 8 lbs; Left: 4 lbs,   SENSATION: Light touch: Impaired index finger LUE  EDEMA: Pt with bony defomities at PIP joints of all digits which pt reports are bone spurs, Pt also has bony deformity at DIP for right thumb, index and 5th digit and LUE small and index fingers  COGNITION: Overall cognitive status: Within  functional limits for tasks assessed   OBSERVATIONS: Pleasant female desires to increase functional use of bilateral UE's   TREATMENT DATE: 06/06/22- Paraffin x 10 mins to bilateral UE's for pain and stiffness, no adverse reactions Reviewed A/ROM HEP issued last visit 5-10 reps each bilateral UE's. Discussions regarding activity modification and potential AE including opening bottles/ jars, and use of food processor for chopping vegatables instead of chopping with a knife. Pt was shown some options on the internet. Discussed importance of warming up before activity with paraffin or gentle A/ROM   05/30/23- Paraffin x 10 mins to bilateral UE's for pain and stiffness, then pt was instucted in  A/ROM HEP. See pt. instructions. Therapist began discussing activity modifications and AE. Pt was provided with a foam grip for increased ease with handwriting.  Pt tried using the sock aide however it was more challenging than donning the sock the traditional way.   05/24/23 eval only                                                                                                                                PATIENT EDUCATION: Education details:  inital A/ROM HEP, beginning activity modification for handwriting Person educated: Patient Education method: Explanation, demonstration, handout Education comprehension: verbalized understanding, returned demonstration  HOME EXERCISE PROGRAM: n/a  GOALS: Goals reviewed with patient? Yes  SHORT TERM GOALS: Target date: 06/24/23  I with HEP  Goal status:  met 06/07/23  2.  I with positioning/ splinting prn to minimize pain and defomity   Goal status: INITIAL  3.  Pt will increase bilateral grip strength by 5 lbs for increased UE functional use. Baseline: 05/30/23- RUE 8 lbs, LUE 4 lbs Goal status: INITIAL    LONG TERM GOALS: Target date: 08/16/23  I with updated HEP  Goal status: INITIAL  2.  Pt will improve Quick Dash score to 70%  disability Baseline: 75% (05/30/23) Goal status: INITIAL  3.  Pt will demonstrate at least 65% composte flexion for LUE for increased functional use.  Goal status: INITIAL  4.  I with adapted strategies/ adapted equipment to minimize pain and to increase pt I with ADLs/IADLs  Goal status: INITIAL  5.  Pt will demonstrate at least 65% composite flexion for RUE for increased functional use.  Goal status: INITIAL    ASSESSMENT:  CLINICAL IMPRESSION: Patient is progressing towards goals. She demonstrates small gains in ROM. She verbalizes understanding of beginning suggestions for AE/ adapted strategies for ADLs. PERFORMANCE DEFICITS: in functional skills including ADLs, IADLs, coordination, sensation, edema, ROM, strength, pain, flexibility, Fine motor control, Gross motor control, endurance, decreased knowledge of precautions, decreased knowledge of use of DME, and UE functional use,, and psychosocial skills including coping strategies, environmental adaptation, habits, interpersonal interactions, and routines and behaviors.   IMPAIRMENTS: are limiting patient from ADLs, IADLs, rest and sleep, education, play, leisure, and social participation.   COMORBIDITIES: may have co-morbidities  that affects occupational performance. Patient will benefit from skilled OT to address above impairments and improve overall function.  MODIFICATION OR ASSISTANCE TO COMPLETE EVALUATION: No modification of tasks or assist necessary to complete an evaluation.  OT OCCUPATIONAL PROFILE AND HISTORY: Detailed assessment: Review of records and additional review of physical, cognitive, psychosocial history related to current functional performance.  CLINICAL DECISION MAKING: LOW - limited treatment options, no task modification necessary  REHAB POTENTIAL: Good  EVALUATION COMPLEXITY: Low      PLAN:  OT FREQUENCY: 2x/week plus eval  OT DURATION: 12 weeks  PLANNED INTERVENTIONS: 97168 OT  Re-evaluation, 97535 self care/ADL training, 02889 therapeutic exercise, 97530 therapeutic activity,  02887 neuromuscular re-education, 97140 manual therapy, [id number] aquatic therapy, 97035 ultrasound, 02981 paraffin, 97039 fluidotherapy, 97010 moist heat, 97010 cryotherapy, 97034 contrast bath, 97760 Orthotics management and training, 97760 Splinting (initial encounter), [id number] Subsequent splinting/medication, passive range of motion, energy conservation, coping strategies training, patient/family education, and DME and/or AE instructions  RECOMMENDED OTHER SERVICES: none  CONSULTED AND AGREED WITH PLAN OF CARE: Patient  PLAN FOR NEXT SESSION: paraffin,  functional use of UE's, ADL strategies   Amyiah Gaba, OT 06/07/2023, 11:27 AM

## 2023-06-08 ENCOUNTER — Ambulatory Visit: Payer: Medicare Other | Admitting: Physical Therapy

## 2023-06-08 ENCOUNTER — Encounter: Payer: Self-pay | Admitting: Physical Therapy

## 2023-06-08 DIAGNOSIS — M6281 Muscle weakness (generalized): Secondary | ICD-10-CM | POA: Diagnosis not present

## 2023-06-08 DIAGNOSIS — M25611 Stiffness of right shoulder, not elsewhere classified: Secondary | ICD-10-CM

## 2023-06-08 DIAGNOSIS — R278 Other lack of coordination: Secondary | ICD-10-CM | POA: Diagnosis not present

## 2023-06-08 DIAGNOSIS — M25642 Stiffness of left hand, not elsewhere classified: Secondary | ICD-10-CM | POA: Diagnosis not present

## 2023-06-08 DIAGNOSIS — R600 Localized edema: Secondary | ICD-10-CM | POA: Diagnosis not present

## 2023-06-08 DIAGNOSIS — M542 Cervicalgia: Secondary | ICD-10-CM

## 2023-06-08 DIAGNOSIS — M79642 Pain in left hand: Secondary | ICD-10-CM | POA: Diagnosis not present

## 2023-06-08 DIAGNOSIS — M25641 Stiffness of right hand, not elsewhere classified: Secondary | ICD-10-CM | POA: Diagnosis not present

## 2023-06-08 DIAGNOSIS — G8929 Other chronic pain: Secondary | ICD-10-CM | POA: Diagnosis not present

## 2023-06-08 DIAGNOSIS — M79641 Pain in right hand: Secondary | ICD-10-CM | POA: Diagnosis not present

## 2023-06-08 DIAGNOSIS — M25511 Pain in right shoulder: Secondary | ICD-10-CM | POA: Diagnosis not present

## 2023-06-08 DIAGNOSIS — R519 Headache, unspecified: Secondary | ICD-10-CM | POA: Diagnosis not present

## 2023-06-08 NOTE — Therapy (Signed)
 OUTPATIENT PHYSICAL THERAPY SHOULDER   Patient Name: Veronica Galvan MRN: [id number] DOB:May 05, 1955, 68 y.o., female Today's Date: 06/08/2023  END OF SESSION:  PT End of Session - 06/08/23 1612     Visit Number 49    Authorization Type [employer]BS [place]    PT Start Time [phone]    PT Stop Time 1715    PT Time Calculation (min) 62 min    Activity Tolerance Patient tolerated treatment well    Behavior During Therapy WFL for tasks assessed/performed             Past Medical History:  Diagnosis Date   Allergy     Anxiety    Arthritis    hands   Cataract    Diabetes mellitus without complication (HCC)    type 2   Family history of adverse reaction to anesthesia    son has malignant hyperthermia, daughter does not daughter recently had c section without problems   GERD (gastroesophageal reflux disease)    Headache    sinus   Hyperlipidemia    Hypertension    PONV (postoperative nausea and vomiting)    nausea only   Ulcer, stomach peptic yrs ago   Past Surgical History:  Procedure Laterality Date   APPENDECTOMY     both hells bone spur repair     both heels with metal clips   both shoulder rotator cuff repair     CESAREAN SECTION     x 1   COLONOSCOPY WITH PROPOFOL  N/A 09/07/2016   Procedure: COLONOSCOPY WITH PROPOFOL ;  Surgeon: Vicci Gladis POUR, MD;  Location: [facility];  Service: Endoscopy;  Laterality: N/A;   colonscopy  06/2011   polyps   EYE SURGERY     FRACTURE SURGERY     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/10/2022   Procedure: REVERSE SHOULDER ARTHROPLASTY;  Surgeon: Melita Drivers, MD;  Location: [facility];  Service: Orthopedics;  Laterality: Right;  Please follow in room 6 if able   TUBAL LIGATION     VESICO-VAGINAL FISTULA REPAIR     Patient Active Problem List   Diagnosis Date Noted   Gastroesophageal reflux disease 11/16/2021   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 08/18/2021   Dermatofibroma 08/18/2021   History of  malignant neoplasm of skin 08/18/2021   Lentigo 08/18/2021   Melanocytic nevi of trunk 08/18/2021   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   Rosacea 08/18/2021   Actinic keratosis 08/18/2021   Sensorineural hearing loss (SNHL) of left ear with unrestricted hearing of right ear 07/26/2021   Tinnitus of left ear 07/26/2021   Acute recurrent maxillary sinusitis 06/22/2021   Primary hypertension 01/12/2021   Hyperlipidemia associated with type 2 diabetes mellitus (HCC) 01/12/2021   Arthritis 01/12/2021   Type II diabetes mellitus (HCC) 11/25/2019   Ingrown toenail 09/05/2018   Neck pain 01/29/2018   Tick bite 10/24/2017    PCP: [name], [name]  REFERRING PROVIDER: Melita, MD  REFERRING DIAG: s/p right reverse TSA  THERAPY DIAG:  Muscle weakness (generalized)  Localized edema  Acute pain of right shoulder  Stiffness of right shoulder, not elsewhere classified  Cervicalgia  Chronic intractable headache, unspecified headache type  Rationale for Evaluation and Treatment: Rehabilitation  ONSET DATE: 11/05/22  SUBJECTIVE:  SUBJECTIVE STATEMENT:  Patient reports that she had to got to [facility] this AM.  She reports a head cold, she reports a significant HA  PAIN:  Are you having pain? Yes: NPRS scale: 7/10 Pain location: right shoulder, HA Pain description: dull ache at rest, sharp with motions Aggravating factors: quick motions, movements pain up to 10/10 Relieving factors: ice, rest, Tylenol  at best 3/10  PRECAUTIONS: Shoulder Protocol in the chart  RED FLAGS: None   WEIGHT BEARING RESTRICTIONS: No  FALLS:  Has patient fallen in last 6 months? Yes. Number of falls 1  LIVING ENVIRONMENT: Lives with: lives alone Lives in: House/apartment Stairs: No Has following equipment at  home: None  OCCUPATION: retired  PLOF: Independent  PATIENT GOALS:dress without difficulty, do hair, have good ROM and less pain  NEXT MD VISIT:   OBJECTIVE:   DIAGNOSTIC FINDINGS:  See above  PATIENT SURVEYS:  FOTO 21  COGNITION: Overall cognitive status: Within functional limits for tasks assessed     SENSATION: WFL  POSTURE: Fwd head, rounded shoulders, elevated and guarded shoulder  UPPER EXTREMITY ROM:   Active ROM Right PROM eval Right PROM Supine 01/05/23 PROM  01/26/23 PROM 02/09/23 AROM sitting 02/14/23 AROM 02/21/23 AROM  03/02/23 AROM  03/22/23 AAROM 03/28/23 AROM Standing 04/13/23 AROM Standing 05/02/23 AROM  Standing 05/11/23 AROM  Standing 05/25/23 AROM Standing 06/01/23  Shoulder flexion 35  118  123 90 96 95 100 111 110 112 120 125  129  Shoulder extension 5                Shoulder abduction 20  90 92  73 80 83 90 93 90 92 98 100 102   Shoulder adduction                 Shoulder internal rotation 15             40 40  Shoulder external rotation 20  45 30  41  45 50 53 56 56 60 66 67  Elbow flexion 120                Elbow extension 5                Wrist flexion                 Wrist extension                 Wrist ulnar deviation                 Wrist radial deviation                 Wrist pronation                 Wrist supination                 (Blank rows = not tested)  UPPER EXTREMITY MMT:  No tested due to recent surgery  MMT Right eval Left eval  Shoulder flexion    Shoulder extension    Shoulder abduction    Shoulder adduction    Shoulder internal rotation    Shoulder external rotation    Middle trapezius    Lower trapezius    Elbow flexion    Elbow extension    Wrist flexion    Wrist extension    Wrist ulnar deviation    Wrist radial deviation    Wrist pronation    Wrist supination    Grip strength (  lbs)    (Blank rows = not tested)  PALPATION:  Very tight and tender in the pectoral, upper trap, the entire right  upper arm   TODAY'S TREATMENT:                                                                                                                                         DATE:  06/08/23 UBE level 4 x 6 minutes chest press 5# Lats 15# Wall slides Supine PROM/joint mobs STM to the neck upper trap, rhomboid and upper arm Vaso medium pressure 35 degrees  06/06/23 UBE level 4 x 6 minutes PROM all motions in supine, included cross body stretch and star gazer STM to the right upper arm Vaso medium pressure  06/01/23 UBE LEvel 4 x 6 minutes Wall slides, circles Supine 3# wate bar chest press and then flexion 3# isometric circles 3# serratus punch Passive stretch right shoulder all motions ROM measured as above Vaso medium pressure 34 degrees  05/30/23 UBE level 3 x 6 minutes Red tband ER/IR Isometrics all shoulder motions Supine punches and flexion STM to the neck, upper trap, rhomboid and the right upper arm Passive stretch to the right shoulder  Vaso Medium pressure 35 degrees  05/25/23 Nustep level 4 push and pull and then cross body push and pull 3# wate bar extension Body blade 20 seconds all motions 2x20 seconds Passive stretch all motions STM to the right upper arm Vaso medium pressure 34 degrees  05/23/23 Nustep push and pull front to back and across body total 5 minutes Seated row 20# Lats 20# Chest press 5# 25# triceps 10# biceps 3# wate bar overhead press 3# wate bar extension Supine wate bare chest press 3# chest press 3# ER/IR 3# wate bar flexion in supine Passive stretch all motions STM to the right upper arm Vaso medium pressure 34 degrees  05/18/23 UBE level 4 x 4 minutes Nustep push pull and cross body push /pull level 3 Fitter 1 blue band side to side 5# chest press Doorway stretch 3 ways Passive right shoulder stretch all motions except did very light IR according to protocol STM to the right shoulder and upper arm Vaso medium pressure 36  degrees  05/16/23 UBE level 4 x 5 minutes Rows 15# Lats 20# Chest press 5# Small ball throws Yellow tband ER/IR Yellow row and flexion tband  Supine 3# punches 3# isometric circles 3# ER/IR STM to the right upper arm PROM right shoulder Vaso medium pressure  05/11/23 Weight 193.5# Nustep level 5 x 5 minutes Wall slide and circles 15# lat pulls Wall slides PROM right shoulder all motions except IR STM to the right upper arm AROM measured as noted above Vaso medium pressure 34 degrees  05/09/23 Weight 195# Standing UBE level 4 x 4 minutes Triceps 25# 2x15 Biceps 10# 2x15 Fitter 1 blue band push/pull then side to side Small weighted  ball circles with pressure on wall small weighted ball tosses Weighted ball overhead rhythmic stabilization Physioball chest pass and overhead pass Supine 5# chest press small ROM Supine 3# flexion small ROM Supine Er from belly 2# PROM of the shoulder Vaso 34 degrees  PATIENT EDUCATION: Education details: poc/hep Person educated: Patient Education method: Programmer, Multimedia, Facilities Manager, Actor cues, Verbal cues, and Handouts Education comprehension: verbalized understanding  HOME EXERCISE PROGRAM: Access Code: [email or web address] URL: [email or web address] Date: 12/20/2022 Prepared by: Ozell Mainland  Exercises - Seated Shoulder Flexion Towel Slide at Table Top  - 2 x daily - 7 x weekly - 2 sets - 10 reps - 3 hold - Seated Shoulder External Rotation PROM on Table  - 2 x daily - 7 x weekly - 2 sets - 10 reps - 3 hold - Seated Elbow Extension and Shoulder External Rotation AAROM at Table with Towel  - 2 x daily - 7 x weekly - 2 sets - 10 reps - 3 hold - Circular Shoulder Pendulum with Table Support  - 2 x daily - 7 x weekly - 2 sets - 10 reps - 3 hold  ASSESSMENT:  CLINICAL IMPRESSION: Patient has increased pain and soreness due to taking care of her grandkids over the weekend on her own, she reports that she had to make a run this  AM to [facility] and is also starting to feel a cold coming on, not feeling well overall with right shoulder and arm pain, she is very tender with knots in the right upper lateral arm. .   OBJECTIVE IMPAIRMENTS: cardiopulmonary status limiting activity, decreased activity tolerance, decreased endurance, decreased ROM, decreased strength, increased edema, increased muscle spasms, impaired flexibility, impaired UE functional use, improper body mechanics, postural dysfunction, and pain .  REHAB POTENTIAL: Good  CLINICAL DECISION MAKING: Evolving/moderate complexity  EVALUATION COMPLEXITY: Low   GOALS: Goals reviewed with patient? Yes  SHORT TERM GOALS: Target date: 01/01/23  Independent with initial HEP Goal status: 12/27/22 MET  LONG TERM GOALS: Target date: 03/22/23  Decrease pain 50% Goal status: 03/22/23 progressing  2.  Dress without difficulty Goal status: progressing 05/11/23  3.  Do hair without difficulty Goal status: able to reach the top of head at times progressing 05/25/23  4.  Increase AROM right shoulder flexion to 130 degrees Goal status: 06/01/23 progressing 120 degrees  5.  Increase right shoulder ER to 60 degrees Goal status: progressing 06/01/23  6.  Return to water  aerobics and or gym activity Goal status:progressing met 06/01/23  PLAN:  PT FREQUENCY: 1-2x/week  PT DURATION: 12 weeks  PLANNED INTERVENTIONS: Therapeutic exercises, Therapeutic activity, Neuromuscular re-education, Balance training, Gait training, Patient/Family education, Self Care, Joint mobilization, Dry Needling, Electrical stimulation, Cryotherapy, Vasopneumatic device, and Manual therapy  PLAN FOR NEXT SESSION:   focus more on ROM and decrease the weights as she is so active MAINLAND OZELL ORN, PT 06/08/2023, 4:26 PM [facility] [address] [facility] [place], [place], [address] Phone: [phone]   Fax:  [phone]

## 2023-06-09 ENCOUNTER — Ambulatory Visit (INDEPENDENT_AMBULATORY_CARE_PROVIDER_SITE_OTHER): Payer: Medicare Other | Admitting: Family Medicine

## 2023-06-09 ENCOUNTER — Encounter: Payer: Self-pay | Admitting: Family Medicine

## 2023-06-09 VITALS — BP 120/59 | HR 97 | Temp 98.1°F | Ht 61.0 in | Wt 195.0 lb

## 2023-06-09 DIAGNOSIS — R051 Acute cough: Secondary | ICD-10-CM

## 2023-06-09 DIAGNOSIS — J019 Acute sinusitis, unspecified: Secondary | ICD-10-CM

## 2023-06-09 DIAGNOSIS — J029 Acute pharyngitis, unspecified: Secondary | ICD-10-CM

## 2023-06-09 LAB — POCT RAPID STREP A (OFFICE): Rapid Strep A Screen: NEGATIVE

## 2023-06-09 LAB — POC COVID19 BINAXNOW: SARS Coronavirus 2 Ag: NEGATIVE

## 2023-06-09 MED ORDER — AZITHROMYCIN 250 MG PO TABS: ORAL_TABLET | ORAL | 0 refills | Status: AC

## 2023-06-09 NOTE — Progress Notes (Signed)
 Acute Office Visit  Subjective:     Patient ID: Veronica Galvan, female    DOB: [birth year], 68 y.o.   MRN: [id number]  Chief Complaint  Patient presents with   Sinus Problem     Patient is in today for URI symptoms.    Discussed the use of AI scribe software for clinical note transcription with the patient, who gave verbal consent to proceed.  History of Present Illness   Veronica Galvan is a 68 year old female who presents with upper respiratory symptoms and sinus pressure.  She began experiencing upper respiratory symptoms approximately four days ago, starting with a sore throat. Despite using lozenges, her symptoms have worsened, leading to significant congestion and sinus pressure.  She describes sinus pressure particularly affecting her left ear, with a 'shooting' pain that began after blowing her nose, causing a 'crack and pop' sensation. She has been using nasal irrigation to manage her symptoms due to small Eustachian tubes that trap congestion.  She has a persistent cough, described as not deep but rather 'up here,' with significant postnasal drainage that is green and yellow. She finds Delsym effective for the cough and plans to continue using it along with lozenges.  Over the weekend, she babysat her grandchildren, including an eight-month-old and a four-year-old. She denies significant flu exposure and reports no high fevers, vomiting, or severe body aches, though she has had mild fever and discomfort attributed to poor sleep, managed with Tylenol .             ROS All review of systems negative except what is listed in the HPI      Objective:    BP (!) 120/59   Pulse 97   Temp 98.1 F (36.7 C) (Oral)   Ht 5' 1 (1.549 m)   Wt 195 lb (88.5 kg)   SpO2 96%   BMI 36.84 kg/m    Physical Exam Vitals reviewed.  Constitutional:      Appearance: Normal appearance. She is obese.  HENT:     Head: Normocephalic and atraumatic.     Right Ear:  Tympanic membrane normal.     Left Ear: Tympanic membrane is erythematous.     Nose: Congestion and rhinorrhea present.     Mouth/Throat:     Mouth: Mucous membranes are moist.     Comments: Postnasal drainage Cardiovascular:     Rate and Rhythm: Normal rate and regular rhythm.     Heart sounds: Normal heart sounds.  Pulmonary:     Effort: Pulmonary effort is normal.     Breath sounds: Normal breath sounds. No wheezing, rhonchi or rales.  Lymphadenopathy:     Cervical: No cervical adenopathy.  Skin:    General: Skin is warm and dry.  Neurological:     Mental Status: She is alert and oriented to person, place, and time.  Psychiatric:        Mood and Affect: Mood normal.        Behavior: Behavior normal.        Thought Content: Thought content normal.        Judgment: Judgment normal.        Results for orders placed or performed in visit on 06/09/23  POCT rapid strep A  Result Value Ref Range   Rapid Strep A Screen Negative Negative  POC COVID-19 BinaxNow  Result Value Ref Range   SARS Coronavirus 2 Ag Negative Negative        Assessment &  Plan:   Problem List Items Addressed This Visit   None Visit Diagnoses       Acute rhinosinusitis    -  Primary   Relevant Medications   azithromycin  (ZITHROMAX ) 250 MG tablet     Acute cough       Relevant Orders   POC COVID-19 BinaxNow (Completed)     Sore throat       Relevant Orders   POCT rapid strep A (Completed)     Strep and COVID negative. Out of window for Flu testing Likely a viral upper respiratory infection - however concern for developing sinus/ear infection. Will send watch and wait: Azithromycin  to use if you don't improve within the next 2-3 days or if you get significantly worse before then.  Continue supportive measures including rest, hydration, humidifier use, steam showers, warm compresses to sinuses, warm liquids with lemon and honey, and over-the-counter cough, cold, and analgesics as  needed.  Meds ordered this encounter  Medications   azithromycin  (ZITHROMAX ) 250 MG tablet    Sig: Take 2 tablets on day 1, then 1 tablet daily on days 2 through 5    Dispense:  6 tablet    Refill:  0    Supervising Provider:   DOMENICA BLACKBIRD A [[id number]]    Return if symptoms worsen or fail to improve.  [name] KATHEE Mon, NP

## 2023-06-09 NOTE — Patient Instructions (Signed)
 Strep and COVID negative. Out of window for Flu testing Likely a viral upper respiratory infection - however concern for developing sinus/ear infection. Will send watch and wait: Azithromycin  to use if you don't improve within the next 2-3 days or if you get significantly worse before then.  Continue supportive measures including rest, hydration, humidifier use, steam showers, warm compresses to sinuses, warm liquids with lemon and honey, and over-the-counter cough, cold, and analgesics as needed.  Over the counter medications that may be helpful for symptoms:  Guaifenesin 1200 mg extended release tabs twice daily, with plenty of water  For cough and congestion Brand name: Mucinex   Pseudoephedrine 30 mg, one or two tabs every 4 to 6 hours For sinus congestion Brand name: Sudafed You must get this from the pharmacy counter.  Oxymetazoline nasal spray each morning, one spray in each nostril, for NO MORE THAN 3 days  For nasal and sinus congestion Brand name: Afrin Saline nasal spray or Saline Nasal Irrigation (Netti Pot, etc) 3-5 times a day For nasal and sinus congestion Brand names: Ocean or AYR Fluticasone  nasal spray OR Mometasone nasal spray OR Triamcinolone  Acetonide nasal spray - follow directions on the packaging For nasal and sinus congestion Brand name: Flonase , Nasonex, Nasacort  Warm salt water  gargles  For sore throat Every few hours as needed Alternate ibuprofen  400-600 mg and acetaminophen  1000 mg every 6 hours For fever, body aches, headache Brand names: Motrin  or Advil  and Tylenol  Dextromethorphan 12-hour cough version 30 mg every 12 hours  For cough Brand name: Delsym Stop all other cold medications for now (Nyquil, Dayquil, Tylenol  Cold, Theraflu, etc) and other non-prescription cough/cold preparations. Many of these have the same ingredients listed above and could cause an overdose of medication.   Herbal treatments that have been shown to be helpful in some  patients include: Vitamin C 1000 mg per day Zinc 100 mg per day Quercetin 25-500 mg twice a day Melatonin 5-10mg  at bedtime Honey Green Tea  General Instructions Allow your body to rest Drink PLENTY of fluids Typically, we are the most contagious 1-2 days before symptoms start through the first 2-3 days of most severe symptoms. Per CDC guidelines, you can return to school/work when symptoms have started to improve and you have been fever-free for 24 hours. However, recommend you continue extra precautions for the following 5 days (frequent hand hygiene, masking, covering coughs/sneezes, minimize exposure to immunocompromised individuals, etc).  If you develop severe shortness of breath, uncontrolled fevers, coughing up blood, confusion, chest pain, or signs of dehydration (such as significantly decreased urine amounts or dizziness with standing) please go to the nearest ER.

## 2023-06-13 ENCOUNTER — Ambulatory Visit: Payer: Medicare Other | Admitting: Physical Therapy

## 2023-06-13 ENCOUNTER — Encounter: Payer: Self-pay | Admitting: Physical Therapy

## 2023-06-13 ENCOUNTER — Ambulatory Visit: Payer: Medicare Other | Admitting: Occupational Therapy

## 2023-06-13 DIAGNOSIS — M25642 Stiffness of left hand, not elsewhere classified: Secondary | ICD-10-CM | POA: Diagnosis not present

## 2023-06-13 DIAGNOSIS — M79642 Pain in left hand: Secondary | ICD-10-CM | POA: Diagnosis not present

## 2023-06-13 DIAGNOSIS — R519 Headache, unspecified: Secondary | ICD-10-CM | POA: Diagnosis not present

## 2023-06-13 DIAGNOSIS — M79641 Pain in right hand: Secondary | ICD-10-CM | POA: Diagnosis not present

## 2023-06-13 DIAGNOSIS — M25511 Pain in right shoulder: Secondary | ICD-10-CM | POA: Diagnosis not present

## 2023-06-13 DIAGNOSIS — G8929 Other chronic pain: Secondary | ICD-10-CM | POA: Diagnosis not present

## 2023-06-13 DIAGNOSIS — M25611 Stiffness of right shoulder, not elsewhere classified: Secondary | ICD-10-CM | POA: Diagnosis not present

## 2023-06-13 DIAGNOSIS — M6281 Muscle weakness (generalized): Secondary | ICD-10-CM

## 2023-06-13 DIAGNOSIS — R600 Localized edema: Secondary | ICD-10-CM

## 2023-06-13 DIAGNOSIS — M25641 Stiffness of right hand, not elsewhere classified: Secondary | ICD-10-CM | POA: Diagnosis not present

## 2023-06-13 DIAGNOSIS — M542 Cervicalgia: Secondary | ICD-10-CM | POA: Diagnosis not present

## 2023-06-13 DIAGNOSIS — R278 Other lack of coordination: Secondary | ICD-10-CM | POA: Diagnosis not present

## 2023-06-13 NOTE — Therapy (Signed)
 OUTPATIENT PHYSICAL THERAPY SHOULDER Progress Note Reporting Period 05/11/23 to 06/13/23 for visits 41-50  See note below for Objective Data and Assessment of Progress/Goals.      Patient Name: Veronica Galvan MRN: [id number] DOB:27-Jun-1955, 68 y.o., female Today's Date: 06/13/2023  END OF SESSION:  PT End of Session - 06/13/23 0801     Visit Number 50    Date for PT Re-Evaluation 07/04/23    Authorization Type [employer] [place]    PT Start Time [phone]    PT Stop Time 0900    PT Time Calculation (min) 65 min    Activity Tolerance Patient tolerated treatment well    Behavior During Therapy WFL for tasks assessed/performed             Past Medical History:  Diagnosis Date   Allergy    Anxiety    Arthritis    hands   Cataract    Diabetes mellitus without complication (HCC)    type 2   Family history of adverse reaction to anesthesia    son has malignant hyperthermia, daughter does not daughter recently had c section without problems   GERD (gastroesophageal reflux disease)    Headache    sinus   Hyperlipidemia    Hypertension    PONV (postoperative nausea and vomiting)    nausea only   Ulcer, stomach peptic yrs ago   Past Surgical History:  Procedure Laterality Date   APPENDECTOMY     both hells bone spur repair     both heels with metal clips   both shoulder rotator cuff repair     CESAREAN SECTION     x 1   COLONOSCOPY WITH PROPOFOL N/A 09/07/2016   Procedure: COLONOSCOPY WITH PROPOFOL;  Surgeon: Charolett Bumpers, MD;  Location: [facility];  Service: Endoscopy;  Laterality: N/A;   colonscopy  06/2011   polyps   EYE SURGERY     FRACTURE SURGERY     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/10/2022   Procedure: REVERSE SHOULDER ARTHROPLASTY;  Surgeon: Francena Hanly, MD;  Location: [facility];  Service: Orthopedics;  Laterality: Right;  Please follow in room 6 if able   TUBAL LIGATION     VESICO-VAGINAL FISTULA REPAIR     Patient Active Problem List    Diagnosis Date Noted   Gastroesophageal reflux disease 11/16/2021   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 08/18/2021   Dermatofibroma 08/18/2021   History of malignant neoplasm of skin 08/18/2021   Lentigo 08/18/2021   Melanocytic nevi of trunk 08/18/2021   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   Rosacea 08/18/2021   Actinic keratosis 08/18/2021   Sensorineural hearing loss (SNHL) of left ear with unrestricted hearing of right ear 07/26/2021   Tinnitus of left ear 07/26/2021   Acute recurrent maxillary sinusitis 06/22/2021   Primary hypertension 01/12/2021   Hyperlipidemia associated with type 2 diabetes mellitus (HCC) 01/12/2021   Arthritis 01/12/2021   Type II diabetes mellitus (HCC) 11/25/2019   Ingrown toenail 09/05/2018   Neck pain 01/29/2018   Tick bite 10/24/2017    PCP: Dayton Scrape, [name]  REFERRING PROVIDER: Rennis Chris, MD  REFERRING DIAG: s/p right reverse TSA  THERAPY DIAG:  Muscle weakness (generalized)  Localized edema  Acute pain of right shoulder  Stiffness of right shoulder, not elsewhere classified  Cervicalgia  Chronic intractable headache, unspecified headache type  Rationale for Evaluation and Treatment: Rehabilitation  ONSET DATE: 11/05/22  SUBJECTIVE:  SUBJECTIVE STATEMENT:  Patient reports that she got sick after the last treatment, she reports a sinus infection.  She is still very sore in the shoulder and arm  PAIN:  Are you having pain? Yes: NPRS scale: 7/10 Pain location: right shoulder, HA Pain description: dull ache at rest, sharp with motions Aggravating factors: quick motions, movements pain up to 10/10 Relieving factors: ice, rest, Tylenol at best 3/10  PRECAUTIONS: Shoulder Protocol in the chart  RED FLAGS: None   WEIGHT  BEARING RESTRICTIONS: No  FALLS:  Has patient fallen in last 6 months? Yes. Number of falls 1  LIVING ENVIRONMENT: Lives with: lives alone Lives in: House/apartment Stairs: No Has following equipment at home: None  OCCUPATION: retired  PLOF: Independent  PATIENT GOALS:dress without difficulty, do hair, have good ROM and less pain  NEXT MD VISIT:   OBJECTIVE:   DIAGNOSTIC FINDINGS:  See above  PATIENT SURVEYS:  FOTO 21  COGNITION: Overall cognitive status: Within functional limits for tasks assessed     SENSATION: WFL  POSTURE: Fwd head, rounded shoulders, elevated and guarded shoulder  UPPER EXTREMITY ROM:   Active ROM Right PROM eval Right PROM Supine 01/05/23 PROM  01/26/23 PROM 02/09/23 AROM sitting 02/14/23 AROM 02/21/23 AROM  03/02/23 AROM  03/22/23 AAROM 03/28/23 AROM Standing 04/13/23 AROM Standing 05/02/23 AROM  Standing 05/11/23 AROM  Standing 05/25/23 AROM Standing 06/01/23  Shoulder flexion 35  118  123 90 96 95 100 111 110 112 120 125  129  Shoulder extension 5                Shoulder abduction 20  90 92  73 80 83 90 93 90 92 98 100 102   Shoulder adduction                 Shoulder internal rotation 15             40 40  Shoulder external rotation 20  45 30  41  45 50 53 56 56 60 66 67  Elbow flexion 120                Elbow extension 5                Wrist flexion                 Wrist extension                 Wrist ulnar deviation                 Wrist radial deviation                 Wrist pronation                 Wrist supination                 (Blank rows = not tested)  UPPER EXTREMITY MMT:  No tested due to recent surgery  MMT Right eval Left eval  Shoulder flexion    Shoulder extension    Shoulder abduction    Shoulder adduction    Shoulder internal rotation    Shoulder external rotation    Middle trapezius    Lower trapezius    Elbow flexion    Elbow extension    Wrist flexion    Wrist extension    Wrist ulnar  deviation    Wrist radial deviation    Wrist pronation    Wrist supination  Grip strength (lbs)    (Blank rows = not tested)  PALPATION:  Very tight and tender in the pectoral, upper trap, the entire right upper arm   TODAY'S TREATMENT:                                                                                                                                         DATE:  06/13/23 UBE level 4 x 6 minutes Wall slides Stick exercises with some passive stretch Supine 2# and 3# exercises to increase strength and function Supine passive stretch of the right shoulder all motions to her tolerance some stretch of the pectoral, and cross body stretch with star gazer stretch STM to the right upper arm and the right upper trap, neck and rhomboid, a lot of trigger points in the rhomboid and upper trap that referred pain to the head and down the right arm Vaso medium pressure 34 degrees  06/08/23 UBE level 4 x 6 minutes chest press 5# Lats 15# Wall slides Supine PROM/joint mobs STM to the neck upper trap, rhomboid and upper arm Vaso medium pressure 35 degrees  06/06/23 UBE level 4 x 6 minutes PROM all motions in supine, included cross body stretch and star gazer STM to the right upper arm Vaso medium pressure  06/01/23 UBE LEvel 4 x 6 minutes Wall slides, circles Supine 3# wate bar chest press and then flexion 3# isometric circles 3# serratus punch Passive stretch right shoulder all motions ROM measured as above Vaso medium pressure 34 degrees  05/30/23 UBE level 3 x 6 minutes Red tband ER/IR Isometrics all shoulder motions Supine punches and flexion STM to the neck, upper trap, rhomboid and the right upper arm Passive stretch to the right shoulder  Vaso Medium pressure 35 degrees  05/25/23 Nustep level 4 push and pull and then cross body push and pull 3# wate bar extension Body blade 20 seconds all motions 2x20 seconds Passive stretch all motions STM to the right upper  arm Vaso medium pressure 34 degrees  05/23/23 Nustep push and pull front to back and across body total 5 minutes Seated row 20# Lats 20# Chest press 5# 25# triceps 10# biceps 3# wate bar overhead press 3# wate bar extension Supine wate bare chest press 3# chest press 3# ER/IR 3# wate bar flexion in supine Passive stretch all motions STM to the right upper arm Vaso medium pressure 34 degrees  05/18/23 UBE level 4 x 4 minutes Nustep push pull and cross body push /pull level 3 Fitter 1 blue band side to side 5# chest press Doorway stretch 3 ways Passive right shoulder stretch all motions except did very light IR according to protocol STM to the right shoulder and upper arm Vaso medium pressure 36 degrees  05/16/23 UBE level 4 x 5 minutes Rows 15# Lats 20# Chest press 5# Small ball throws Yellow tband ER/IR Yellow row and  flexion tband  Supine 3# punches 3# isometric circles 3# ER/IR STM to the right upper arm PROM right shoulder Vaso medium pressure  PATIENT EDUCATION: Education details: poc/hep Person educated: Patient Education method: Programmer, multimedia, Facilities manager, Actor cues, Verbal cues, and Handouts Education comprehension: verbalized understanding  HOME EXERCISE PROGRAM: Access Code: [email or web address] URL: [email or web address] Date: 12/20/2022 Prepared by: Stacie Glaze  Exercises - Seated Shoulder Flexion Towel Slide at Table Top  - 2 x daily - 7 x weekly - 2 sets - 10 reps - 3 hold - Seated Shoulder External Rotation PROM on Table  - 2 x daily - 7 x weekly - 2 sets - 10 reps - 3 hold - Seated Elbow Extension and Shoulder External Rotation AAROM at Table with Towel  - 2 x daily - 7 x weekly - 2 sets - 10 reps - 3 hold - Circular Shoulder Pendulum with Table Support  - 2 x daily - 7 x weekly - 2 sets - 10 reps - 3 hold  ASSESSMENT:  CLINICAL IMPRESSION: Patient ended up last week getting sick after taking care of grandkids, she will be  travelling there again today, she also did a lot of cooking over the weekend, she continues to have the higher ratings of pain in the right upper arm, I did find a lot of trigger points in the right upper trap and rhomboid that referred pain to head and down the right arm  OBJECTIVE IMPAIRMENTS: cardiopulmonary status limiting activity, decreased activity tolerance, decreased endurance, decreased ROM, decreased strength, increased edema, increased muscle spasms, impaired flexibility, impaired UE functional use, improper body mechanics, postural dysfunction, and pain .  REHAB POTENTIAL: Good  CLINICAL DECISION MAKING: Evolving/moderate complexity  EVALUATION COMPLEXITY: Low   GOALS: Goals reviewed with patient? Yes  SHORT TERM GOALS: Target date: 01/01/23  Independent with initial HEP Goal status: 12/27/22 MET  LONG TERM GOALS: Target date: 03/22/23  Decrease pain 50% Goal status: 03/22/23 progressing  2.  Dress without difficulty Goal status: progressing 05/11/23  3.  Do hair without difficulty Goal status: able to reach the top of head at times progressing 05/25/23  4.  Increase AROM right shoulder flexion to 130 degrees Goal status: 06/01/23 progressing 120 degrees  5.  Increase right shoulder ER to 60 degrees Goal status: progressing 06/01/23  6.  Return to water aerobics and or gym activity Goal status:progressing met 06/01/23  PLAN:  PT FREQUENCY: 1-2x/week  PT DURATION: 12 weeks  PLANNED INTERVENTIONS: Therapeutic exercises, Therapeutic activity, Neuromuscular re-education, Balance training, Gait training, Patient/Family education, Self Care, Joint mobilization, Dry Needling, Electrical stimulation, Cryotherapy, Vasopneumatic device, and Manual therapy  PLAN FOR NEXT SESSION:   focus more on ROM and decrease the weights as she is so active Jearld Lesch, PT 06/13/2023, 8:01 AM [facility] [address] [facility] [place], [place], [address] Phone: [phone]   Fax:  [phone]

## 2023-06-14 ENCOUNTER — Ambulatory Visit: Payer: Medicare Other | Admitting: Occupational Therapy

## 2023-06-14 DIAGNOSIS — M25511 Pain in right shoulder: Secondary | ICD-10-CM | POA: Diagnosis not present

## 2023-06-14 DIAGNOSIS — G8929 Other chronic pain: Secondary | ICD-10-CM | POA: Diagnosis not present

## 2023-06-14 DIAGNOSIS — M25641 Stiffness of right hand, not elsewhere classified: Secondary | ICD-10-CM

## 2023-06-14 DIAGNOSIS — R600 Localized edema: Secondary | ICD-10-CM | POA: Diagnosis not present

## 2023-06-14 DIAGNOSIS — M6281 Muscle weakness (generalized): Secondary | ICD-10-CM | POA: Diagnosis not present

## 2023-06-14 DIAGNOSIS — R278 Other lack of coordination: Secondary | ICD-10-CM

## 2023-06-14 DIAGNOSIS — M79642 Pain in left hand: Secondary | ICD-10-CM

## 2023-06-14 DIAGNOSIS — R519 Headache, unspecified: Secondary | ICD-10-CM | POA: Diagnosis not present

## 2023-06-14 DIAGNOSIS — M542 Cervicalgia: Secondary | ICD-10-CM | POA: Diagnosis not present

## 2023-06-14 DIAGNOSIS — M25611 Stiffness of right shoulder, not elsewhere classified: Secondary | ICD-10-CM | POA: Diagnosis not present

## 2023-06-14 DIAGNOSIS — M79641 Pain in right hand: Secondary | ICD-10-CM

## 2023-06-14 DIAGNOSIS — M25642 Stiffness of left hand, not elsewhere classified: Secondary | ICD-10-CM

## 2023-06-14 NOTE — Therapy (Signed)
OUTPATIENT OCCUPATIONAL THERAPY ORTHO EVALUATION  Patient Name: Veronica Galvan MRN: [id number] DOB:Nov 12, 1955, 68 y.o., female Today's Date: 06/14/2023  PCP: Olive Bass [name] REFERRING PROVIDER: Olive Bass [name]  END OF SESSION:  OT End of Session - 06/14/23 1454     Visit Number 4    Number of Visits 25    Date for OT Re-Evaluation 08/16/23    Authorization Type [employer] MCR    Authorization - Visit Number 4    Progress Note Due on Visit 10    OT Start Time 1357    OT Stop Time 1443    OT Time Calculation (min) 46 min                Past Medical History:  Diagnosis Date   Allergy    Anxiety    Arthritis    hands   Cataract    Diabetes mellitus without complication (HCC)    type 2   Family history of adverse reaction to anesthesia    son has malignant hyperthermia, daughter does not daughter recently had c section without problems   GERD (gastroesophageal reflux disease)    Headache    sinus   Hyperlipidemia    Hypertension    PONV (postoperative nausea and vomiting)    nausea only   Ulcer, stomach peptic yrs ago   Past Surgical History:  Procedure Laterality Date   APPENDECTOMY     both hells bone spur repair     both heels with metal clips   both shoulder rotator cuff repair     CESAREAN SECTION     x 1   COLONOSCOPY WITH PROPOFOL N/A 09/07/2016   Procedure: COLONOSCOPY WITH PROPOFOL;  Surgeon: Charolett Bumpers, MD;  Location: [facility];  Service: Endoscopy;  Laterality: N/A;   colonscopy  06/2011   polyps   EYE SURGERY     FRACTURE SURGERY     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/10/2022   Procedure: REVERSE SHOULDER ARTHROPLASTY;  Surgeon: Francena Hanly, MD;  Location: [facility];  Service: Orthopedics;  Laterality: Right;  Please follow in room 6 if able   TUBAL LIGATION     VESICO-VAGINAL FISTULA REPAIR     Patient Active Problem List   Diagnosis Date Noted   Gastroesophageal reflux disease 11/16/2021    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 08/18/2021   Dermatofibroma 08/18/2021   History of malignant neoplasm of skin 08/18/2021   Lentigo 08/18/2021   Melanocytic nevi of trunk 08/18/2021   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   Rosacea 08/18/2021   Actinic keratosis 08/18/2021   Sensorineural hearing loss (SNHL) of left ear with unrestricted hearing of right ear 07/26/2021   Tinnitus of left ear 07/26/2021   Acute recurrent maxillary sinusitis 06/22/2021   Primary hypertension 01/12/2021   Hyperlipidemia associated with type 2 diabetes mellitus (HCC) 01/12/2021   Arthritis 01/12/2021   Type II diabetes mellitus (HCC) 11/25/2019   Ingrown toenail 09/05/2018   Neck pain 01/29/2018   Tick bite 10/24/2017    ONSET DATE: 05/19/23  REFERRING DIAG:  Diagnosis  M79.641,M79.642 (ICD-10-CM) - Bilateral hand pain    THERAPY DIAG:  Muscle weakness (generalized)  Stiffness of right shoulder, not elsewhere classified  Stiffness of right hand, not elsewhere classified  Other lack of coordination  Pain in left hand  Pain in right hand  Stiffness of left hand, not elsewhere classified  Localized edema  Rationale for Evaluation and Treatment: Rehabilitation  SUBJECTIVE:  SUBJECTIVE STATEMENT: Pt reports  helping her son move  Pt accompanied by: self  PERTINENT HISTORY: 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 Yes: NPRS scale: 5-/10 Pain location: bilateral hands Pain description: aching, stiff Aggravating factors: gripping  Relieving factors: heat, meds Pt also has shoulder pain 4-5/10 which OT will not address as PT is working with pt. to address.  FALLS: Has patient fallen in last 6 months?  No  LIVING ENVIRONMENT: Lives with: lives alone Lives in: House/apartment Stairs: yes   PLOF: Independent  PATIENT GOALS: improve functional use of hands and learn adapted strategies for ADLs/IADLs.  NEXT MD VISIT: unknown  OBJECTIVE:  Note: Objective measures were completed at Evaluation unless otherwise noted.  HAND DOMINANCE: Right  ADLs: Overall ADLs: unable to grip credit card to remove from ATM Transfers/ambulation related to ADLs: mod I Eating: drops silverware Grooming: drops toothbrush, uses electric toothbrush, holding comb Upper body dressing: adjusting bra Lower body dressing: difficulty pulling up pants  Toileting: hygeine was difficult initally Bathing: mod I, difficult with shaving Tub shower transfers: walk in shower    FUNCTIONAL OUTCOME MEASURES: Quick Dash: 05/30/23- 75%% disability  UPPER EXTREMITY ROM:   RUE wrist flexion/ extension: 70/ 50, LUE wrist flexion/ extension: 70/ 45 Pt demonstrates grossly 50% composite flexion for right and 555 with left.   Active ROM Right eval Left eval  Thumb MCP (0-60) 45 55  Thumb IP (0-80) 25 10  Thumb Radial abd/add (0-55)     Thumb Palmar abd/add (0-45)     Thumb Opposition to Small Finger yes yes   Index MCP (0-90)     Index PIP (0-100)     Index DIP (0-70)      Long MCP (0-90)      Long PIP (0-100)      Long DIP (0-70)      Ring MCP (0-90)      Ring PIP (0-100)      Ring DIP (0-70)      Little MCP (0-90)      Little PIP (0-100)      Little DIP (0-70)      (Blank rows = not tested)   HAND FUNCTION: Grip strength: Right: 8 lbs; Left: 4 lbs,   SENSATION: Light touch: Impaired index finger LUE  EDEMA: Pt with bony defomities at PIP joints of all digits which pt reports are bone spurs, Pt also has bony deformity at DIP for right thumb, index and 5th digit and LUE small and index fingers  COGNITION: Overall cognitive status: Within functional limits for tasks assessed   OBSERVATIONS:  Pleasant female desires to increase functional use of bilateral UE's   TREATMENT DATE:06/14/23 paraffin x 10 mins to bilateral UE's Ice pack to Left shoulder x 10 mins for pain relief, no adverse reactions Reviewed PIP, DIP blocking exercises, finger thumb opposition and thumb flexion for bilateral UE's. Discussed recommendations for adapted strategies for ADLs including use of food processor to shred chicken. Pt reports using food processory for cutting vegetables and it worked well. Pt reports she purchased several items we discussed last visit and overall they are working well. Therapist began assessing splinting needs, oval 8's were tried but did not provide correct positioning, pt will benefit from custom finger splints in  the future to improve DIP positioning.    06/06/22- Paraffin x 10 mins to bilateral UE's for pain and stiffness, no adverse reactions Reviewed A/ROM HEP issued last visit 5-10 reps each bilateral UE's. Discussions regarding activity modification and potential AE including opening bottles/ jars, and use of food processor for chopping vegatables instead of chopping with a knife. Pt was shown some options on the internet. Discussed importance of warming up before activity with paraffin or gentle A/ROM   05/30/23- Paraffin x 10 mins to bilateral UE's for pain and stiffness, then pt was instucted in A/ROM HEP. See pt. instructions. Therapist began discussing activity modifications and AE. Pt was provided with a foam grip for increased ease with handwriting.  Pt tried using the sock aide however it was more challenging than donning the sock the traditional way.   05/24/23 eval only                                                                                                                                PATIENT EDUCATION: Education details:  inital A/ROM HEP, beginning activity modification for handwriting Person educated: Patient Education method: Explanation,  demonstration, handout Education comprehension: verbalized understanding, returned demonstration  HOME EXERCISE PROGRAM: n/a  GOALS: Goals reviewed with patient? Yes  SHORT TERM GOALS: Target date: 06/24/23  I with HEP  Goal status:  met 06/07/23  2.  I with positioning/ splinting prn to minimize pain and defomity   Goal status: INITIAL  3.  Pt will increase bilateral grip strength by 5 lbs for increased UE functional use. Baseline: 05/30/23- RUE 8 lbs, LUE 4 lbs Goal status: INITIAL    LONG TERM GOALS: Target date: 08/16/23  I with updated HEP  Goal status: INITIAL  2.  Pt will improve Quick Dash score to 70% disability Baseline: 75% (05/30/23) Goal status: INITIAL  3.  Pt will demonstrate at least 65% composte flexion for LUE for increased functional use.  Goal status: INITIAL  4.  I with adapted strategies/ adapted equipment to minimize pain and to increase pt I with ADLs/IADLs  Goal status: INITIAL  5.  Pt will demonstrate at least 65% composite flexion for RUE for increased functional use.  Goal status: INITIAL    ASSESSMENT:  CLINICAL IMPRESSION: Patient is progressing towards goals.  She verbalizes understanding of  suggestions for AE/ adapted strategies for ADLs. she reports food processor worked well when making soup.Pt reports exercising at home. PERFORMANCE DEFICITS: in functional skills including ADLs, IADLs, coordination, sensation, edema, ROM, strength, pain, flexibility, Fine motor control, Gross motor control, endurance, decreased knowledge of precautions, decreased knowledge of use of DME, and UE functional use,, and psychosocial skills including coping strategies, environmental adaptation, habits, interpersonal interactions, and routines and behaviors.   IMPAIRMENTS: are limiting patient from ADLs, IADLs, rest and sleep, education, play, leisure, and social participation.   COMORBIDITIES: may have co-morbidities  that affects occupational  performance. Patient will benefit  from skilled OT to address above impairments and improve overall function.  MODIFICATION OR ASSISTANCE TO COMPLETE EVALUATION: No modification of tasks or assist necessary to complete an evaluation.  OT OCCUPATIONAL PROFILE AND HISTORY: Detailed assessment: Review of records and additional review of physical, cognitive, psychosocial history related to current functional performance.  CLINICAL DECISION MAKING: LOW - limited treatment options, no task modification necessary  REHAB POTENTIAL: Good  EVALUATION COMPLEXITY: Low      PLAN:  OT FREQUENCY: 2x/week plus eval  OT DURATION: 12 weeks  PLANNED INTERVENTIONS: 97168 OT Re-evaluation, 97535 self care/ADL training, 95284 therapeutic exercise, 97530 therapeutic activity, 97112 neuromuscular re-education, 97140 manual therapy, 97113 aquatic therapy, 97035 ultrasound, 97018 paraffin, 13244 fluidotherapy, 97010 moist heat, 97010 cryotherapy, 97034 contrast bath, 97760 Orthotics management and training, 01027 Splinting (initial encounter), [id number] Subsequent splinting/medication, passive range of motion, energy conservation, coping strategies training, patient/family education, and DME and/or AE instructions  RECOMMENDED OTHER SERVICES: none  CONSULTED AND AGREED WITH PLAN OF CARE: Patient  PLAN FOR NEXT SESSION: paraffin,  functional use of UE's, ADL strategies   Anh Bigos, OT 06/14/2023, 4:40 PM

## 2023-06-15 ENCOUNTER — Ambulatory Visit: Payer: Medicare Other | Admitting: Physical Therapy

## 2023-06-16 ENCOUNTER — Ambulatory Visit: Payer: Medicare Other | Admitting: Family

## 2023-06-20 ENCOUNTER — Ambulatory Visit: Payer: Medicare Other | Admitting: Occupational Therapy

## 2023-06-20 ENCOUNTER — Ambulatory Visit (INDEPENDENT_AMBULATORY_CARE_PROVIDER_SITE_OTHER): Payer: Medicare Other | Admitting: Family

## 2023-06-20 ENCOUNTER — Encounter: Payer: Self-pay | Admitting: Pediatrics

## 2023-06-20 ENCOUNTER — Ambulatory Visit: Payer: Medicare Other | Admitting: Physical Therapy

## 2023-06-20 ENCOUNTER — Encounter: Payer: Self-pay | Admitting: Family

## 2023-06-20 VITALS — BP 124/62 | HR 75 | Temp 97.8°F | Ht 61.0 in | Wt 193.6 lb

## 2023-06-20 DIAGNOSIS — M25642 Stiffness of left hand, not elsewhere classified: Secondary | ICD-10-CM

## 2023-06-20 DIAGNOSIS — H6992 Unspecified Eustachian tube disorder, left ear: Secondary | ICD-10-CM

## 2023-06-20 DIAGNOSIS — M25641 Stiffness of right hand, not elsewhere classified: Secondary | ICD-10-CM | POA: Diagnosis not present

## 2023-06-20 DIAGNOSIS — M542 Cervicalgia: Secondary | ICD-10-CM | POA: Diagnosis not present

## 2023-06-20 DIAGNOSIS — M6281 Muscle weakness (generalized): Secondary | ICD-10-CM

## 2023-06-20 DIAGNOSIS — E119 Type 2 diabetes mellitus without complications: Secondary | ICD-10-CM | POA: Diagnosis not present

## 2023-06-20 DIAGNOSIS — M79641 Pain in right hand: Secondary | ICD-10-CM | POA: Diagnosis not present

## 2023-06-20 DIAGNOSIS — G8929 Other chronic pain: Secondary | ICD-10-CM | POA: Diagnosis not present

## 2023-06-20 DIAGNOSIS — M25511 Pain in right shoulder: Secondary | ICD-10-CM | POA: Diagnosis not present

## 2023-06-20 DIAGNOSIS — M79642 Pain in left hand: Secondary | ICD-10-CM | POA: Diagnosis not present

## 2023-06-20 DIAGNOSIS — R600 Localized edema: Secondary | ICD-10-CM | POA: Diagnosis not present

## 2023-06-20 DIAGNOSIS — Z7984 Long term (current) use of oral hypoglycemic drugs: Secondary | ICD-10-CM

## 2023-06-20 DIAGNOSIS — R519 Headache, unspecified: Secondary | ICD-10-CM | POA: Diagnosis not present

## 2023-06-20 DIAGNOSIS — R278 Other lack of coordination: Secondary | ICD-10-CM

## 2023-06-20 DIAGNOSIS — M25611 Stiffness of right shoulder, not elsewhere classified: Secondary | ICD-10-CM | POA: Diagnosis not present

## 2023-06-20 MED ORDER — GLUCOSE BLOOD VI STRP: ORAL_STRIP | 12 refills | Status: AC

## 2023-06-20 MED ORDER — PREDNISONE 20 MG PO TABS: 20.0000 mg | ORAL_TABLET | Freq: Every day | ORAL | 0 refills | Status: DC

## 2023-06-20 MED ORDER — FLUTICASONE PROPIONATE 50 MCG/ACT NA SUSP
2.0000 | Freq: Every day | NASAL | 6 refills | Status: AC
Start: 2023-06-20 — End: ?

## 2023-06-20 NOTE — Therapy (Signed)
 OUTPATIENT OCCUPATIONAL THERAPY ORTHO Treatment  Patient Name: Veronica Galvan MRN: [id number] DOB:Jul 26, 1955, 68 y.o., female Today's Date: 06/20/2023  PCP: Olive Bass [name] REFERRING PROVIDER: Olive Bass [name]  END OF SESSION:  OT End of Session - 06/20/23 1601     Visit Number 5    Authorization Type [employer] MCR    Progress Note Due on Visit 10    OT Start Time 1319    OT Stop Time 1400    OT Time Calculation (min) 41 min                Past Medical History:  Diagnosis Date   Allergy    Anxiety    Arthritis    hands   Cataract    Diabetes mellitus without complication (HCC)    type 2   Family history of adverse reaction to anesthesia    son has malignant hyperthermia, daughter does not daughter recently had c section without problems   GERD (gastroesophageal reflux disease)    Headache    sinus   Hyperlipidemia    Hypertension    PONV (postoperative nausea and vomiting)    nausea only   Ulcer, stomach peptic yrs ago   Past Surgical History:  Procedure Laterality Date   APPENDECTOMY     both hells bone spur repair     both heels with metal clips   both shoulder rotator cuff repair     CESAREAN SECTION     x 1   COLONOSCOPY WITH PROPOFOL N/A 09/07/2016   Procedure: COLONOSCOPY WITH PROPOFOL;  Surgeon: Charolett Bumpers, MD;  Location: [facility];  Service: Endoscopy;  Laterality: N/A;   colonscopy  06/2011   polyps   EYE SURGERY     FRACTURE SURGERY     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/10/2022   Procedure: REVERSE SHOULDER ARTHROPLASTY;  Surgeon: Francena Hanly, MD;  Location: [facility];  Service: Orthopedics;  Laterality: Right;  Please follow in room 6 if able   TUBAL LIGATION     VESICO-VAGINAL FISTULA REPAIR     Patient Active Problem List   Diagnosis Date Noted   Gastroesophageal reflux disease 11/16/2021   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 08/18/2021   Dermatofibroma 08/18/2021   History  of malignant neoplasm of skin 08/18/2021   Lentigo 08/18/2021   Melanocytic nevi of trunk 08/18/2021   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   Rosacea 08/18/2021   Actinic keratosis 08/18/2021   Sensorineural hearing loss (SNHL) of left ear with unrestricted hearing of right ear 07/26/2021   Tinnitus of left ear 07/26/2021   Acute recurrent maxillary sinusitis 06/22/2021   Primary hypertension 01/12/2021   Hyperlipidemia associated with type 2 diabetes mellitus (HCC) 01/12/2021   Arthritis 01/12/2021   Type II diabetes mellitus (HCC) 11/25/2019   Ingrown toenail 09/05/2018   Neck pain 01/29/2018   Tick bite 10/24/2017    ONSET DATE: 05/19/23  REFERRING DIAG:  Diagnosis  M79.641,M79.642 (ICD-10-CM) - Bilateral hand pain    THERAPY DIAG:  Muscle weakness (generalized)  Stiffness of right hand, not elsewhere classified  Other lack of coordination  Pain in left hand  Pain in right hand  Stiffness of left hand, not elsewhere classified  Rationale for Evaluation and Treatment: Rehabilitation  SUBJECTIVE:   SUBJECTIVE STATEMENT: Pt reports she has been using her AE for kitchen tasks Pt accompanied by: self  PERTINENT HISTORY: 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 Rennis Chris, Pt with arthritis and  bone spurs in bilateral hands See PMH above  PRECAUTIONS: Other: avoid right UE internal rotation/ reaching behind back    WEIGHT BEARING RESTRICTIONS: No  PAIN:  Are you having pain? Yes: NPRS scale: 3-/10 Pain location: bilateral hands Pain description: aching, stiff Aggravating factors: gripping  Relieving factors: heat, meds Pt also has shoulder pain 4-5/10 which OT will not address as PT is working with pt. to address.  FALLS: Has patient fallen in last 6 months? No  LIVING ENVIRONMENT: Lives with: lives alone Lives in: House/apartment Stairs: yes   PLOF: Independent  PATIENT GOALS: improve functional use of hands and learn  adapted strategies for ADLs/IADLs.  NEXT MD VISIT: unknown  OBJECTIVE:  Note: Objective measures were completed at Evaluation unless otherwise noted.  HAND DOMINANCE: Right  ADLs: Overall ADLs: unable to grip credit card to remove from ATM Transfers/ambulation related to ADLs: mod I Eating: drops silverware Grooming: drops toothbrush, uses electric toothbrush, holding comb Upper body dressing: adjusting bra Lower body dressing: difficulty pulling up pants  Toileting: hygeine was difficult initally Bathing: mod I, difficult with shaving Tub shower transfers: walk in shower    FUNCTIONAL OUTCOME MEASURES: Quick Dash: 05/30/23- 75%% disability  UPPER EXTREMITY ROM:   RUE wrist flexion/ extension: 70/ 50, LUE wrist flexion/ extension: 70/ 45 Pt demonstrates grossly 50% composite flexion for right and 555 with left.   Active ROM Right eval Left eval  Thumb MCP (0-60) 45 55  Thumb IP (0-80) 25 10  Thumb Radial abd/add (0-55)     Thumb Palmar abd/add (0-45)     Thumb Opposition to Small Finger yes yes   Index MCP (0-90)     Index PIP (0-100)     Index DIP (0-70)      Long MCP (0-90)      Long PIP (0-100)      Long DIP (0-70)      Ring MCP (0-90)      Ring PIP (0-100)      Ring DIP (0-70)      Little MCP (0-90)      Little PIP (0-100)      Little DIP (0-70)      (Blank rows = not tested)   HAND FUNCTION: Grip strength: Right: 8 lbs; Left: 4 lbs,   SENSATION: Light touch: Impaired index finger LUE  EDEMA: Pt with bony defomities at PIP joints of all digits which pt reports are bone spurs, Pt also has bony deformity at DIP for right thumb, index and 5th digit and LUE small and index fingers  COGNITION: Overall cognitive status: Within functional limits for tasks assessed   OBSERVATIONS: Pleasant female desires to increase functional use of bilateral UE's   TREATMENT DATE: Today's therapy session was focused on fabrication of custom cicumferential finger splints  for bilateral index fingers to improve DIP positioning to minimize risk for additional deformity. 1 splint was fabicated for right index and 2 for left. Pt's second index finger splint also promotes finger extension at PIP joint.  Pt was instructed in splint wear, care and precautions. Pt. reports AE is helping at home. 06/14/23 paraffin x 10 mins to bilateral UE's Ice pack to Left shoulder x 10 mins for pain relief, no adverse reactions Reviewed PIP, DIP blocking exercises, finger thumb opposition and thumb flexion for bilateral UE's. Discussed recommendations for adapted strategies for ADLs including use of food processor to shred chicken. Pt reports using food processory for cutting vegetables and it worked well. Pt reports  she purchased several items we discussed last visit and overall they are working well. Therapist began assessing splinting needs, oval 8's were tried but did not provide correct positioning, pt will benefit from custom finger splints in the future to improve DIP positioning.    06/06/22- Paraffin x 10 mins to bilateral UE's for pain and stiffness, no adverse reactions Reviewed A/ROM HEP issued last visit 5-10 reps each bilateral UE's. Discussions regarding activity modification and potential AE including opening bottles/ jars, and use of food processor for chopping vegatables instead of chopping with a knife. Pt was shown some options on the internet. Discussed importance of warming up before activity with paraffin or gentle A/ROM   05/30/23- Paraffin x 10 mins to bilateral UE's for pain and stiffness, then pt was instucted in A/ROM HEP. See pt. instructions. Therapist began discussing activity modifications and AE. Pt was provided with a foam grip for increased ease with handwriting.  Pt tried using the sock aide however it was more challenging than donning the sock the traditional way.   05/24/23 eval only                                                                                                                                 PATIENT EDUCATION: Education details:  finger splint wear, care and precautions. Person educated: Patient Education method: Explanation, deonstration, v.c , handout Education comprehension: verbalized understanding, returned demonstration  HOME EXERCISE PROGRAM: AROM  GOALS: Goals reviewed with patient? Yes  SHORT TERM GOALS: Target date: 06/24/23  I with HEP  Goal status:  met 06/07/23  2.  I with positioning/ splinting prn to minimize pain and defomity   Goal status: INITIAL  3.  Pt will increase bilateral grip strength by 5 lbs for increased UE functional use. Baseline: 05/30/23- RUE 8 lbs, LUE 4 lbs Goal status: INITIAL    LONG TERM GOALS: Target date: 08/16/23  I with updated HEP  Goal status: INITIAL  2.  Pt will improve Quick Dash score to 70% disability Baseline: 75% (05/30/23) Goal status: INITIAL  3.  Pt will demonstrate at least 65% composte flexion for LUE for increased functional use.  Goal status: INITIAL  4.  I with adapted strategies/ adapted equipment to minimize pain and to increase pt I with ADLs/IADLs  Goal status: INITIAL  5.  Pt will demonstrate at least 65% composite flexion for RUE for increased functional use.  Goal status: INITIAL    ASSESSMENT:  CLINICAL IMPRESSION: Patient is progressing towards goals.  She verbalizes understanding of finger splint wear care and precuations.PERFORMANCE DEFICITS: in functional skills including ADLs, IADLs, coordination, sensation, edema, ROM, strength, pain, flexibility, Fine motor control, Gross motor control, endurance, decreased knowledge of precautions, decreased knowledge of use of DME, and UE functional use,, and psychosocial skills including coping strategies, environmental adaptation, habits, interpersonal interactions, and routines and behaviors.   IMPAIRMENTS: are limiting patient from ADLs, IADLs, rest and sleep,  education, play,  leisure, and social participation.   COMORBIDITIES: may have co-morbidities  that affects occupational performance. Patient will benefit from skilled OT to address above impairments and improve overall function.  MODIFICATION OR ASSISTANCE TO COMPLETE EVALUATION: No modification of tasks or assist necessary to complete an evaluation.  OT OCCUPATIONAL PROFILE AND HISTORY: Detailed assessment: Review of records and additional review of physical, cognitive, psychosocial history related to current functional performance.  CLINICAL DECISION MAKING: LOW - limited treatment options, no task modification necessary  REHAB POTENTIAL: Good  EVALUATION COMPLEXITY: Low      PLAN:  OT FREQUENCY: 2x/week plus eval  OT DURATION: 12 weeks  PLANNED INTERVENTIONS: 97168 OT Re-evaluation, 97535 self care/ADL training, 13086 therapeutic exercise, 97530 therapeutic activity, 97112 neuromuscular re-education, 97140 manual therapy, 97113 aquatic therapy, 97035 ultrasound, 97018 paraffin, 57846 fluidotherapy, 97010 moist heat, 97010 cryotherapy, 97034 contrast bath, 97760 Orthotics management and training, 96295 Splinting (initial encounter), [id number] Subsequent splinting/medication, passive range of motion, energy conservation, coping strategies training, patient/family education, and DME and/or AE instructions  RECOMMENDED OTHER SERVICES: none  CONSULTED AND AGREED WITH PLAN OF CARE: Patient  PLAN FOR NEXT SESSION: splint check, paraffin,  functional use of UE's, ADL strategies   Skyann Ganim, OT 06/20/2023, 4:06 PM

## 2023-06-20 NOTE — Patient Instructions (Signed)
 Your Splint This splint should initially be fitted by a healthcare practitioner.  The healthcare practitioner is responsible for providing wearing instructions and precautions to the patient, other healthcare practitioners and care provider involved in the patient's care.  This splint was custom made for you. Please read the following instructions to learn about wearing and caring for your splint.  Precautions Should your splint cause any of the following problems, remove the splint immediately and contact your therapist/physician. Swelling Severe Pain Pressure Areas Stiffness Numbness  Do not wear your splint while operating machinery unless it has been fabricated for that purpose.  When To Wear Your Splint Where your splint according to your therapist/physician instructions. rest periods only for 1-2 hours check skin for pressure areas and redness!  Care and Cleaning of Your Splint Keep your splint away from open flames. Your splint will lose its shape in temperatures over 135 degrees Farenheit, ( in car windows, near radiators, ovens or in hot water).  Never make any adjustments to your splint, if the splint needs adjusting remove it and make an appointment to see your therapist.

## 2023-06-20 NOTE — Progress Notes (Signed)
 Veronica Galvan is a 68 y.o. 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   Rosacea 08/18/2021   Actinic keratosis 08/18/2021   Sensorineural hearing loss (SNHL) of left ear with unrestricted hearing of right ear 07/26/2021   Tinnitus of left ear 07/26/2021   Acute recurrent maxillary sinusitis 06/22/2021   Primary hypertension 01/12/2021   Hyperlipidemia associated with type 2 diabetes mellitus (HCC) 01/12/2021   Arthritis 01/12/2021   Type II diabetes mellitus (HCC) 11/25/2019   Ingrown toenail 09/05/2018   Neck pain 01/29/2018   Tick bite 10/24/2017    Current Outpatient Medications  Medication Sig Dispense Refill   acetaminophen (TYLENOL) 650 MG CR tablet Take 1,300 mg by mouth every 8 (eight) hours as needed for pain.     ALPRAZolam (XANAX) 0.5 MG tablet Take 1 tablet (0.5 mg total) by mouth at bedtime as needed for anxiety. 30 tablet 0   Ascorbic Acid (VITAMIN C) 1000 MG tablet Take 1,000 mg by mouth every morning.     aspirin EC 81 MG tablet Take 81 mg by mouth every morning.     atorvastatin (LIPITOR) 40 MG tablet Take 1 tablet (40 mg total) by mouth every evening. 90 tablet 1   Calcium Carb-Cholecalciferol (CALCIUM 600+D3 PO) Take 1 tablet by mouth every morning.     calcium carbonate (TUMS - DOSED IN MG ELEMENTAL CALCIUM) 500 MG chewable tablet Chew 2 tablets by mouth daily as needed for indigestion or heartburn.     carboxymethylcellulose (REFRESH TEARS) 0.5 % SOLN Place 1 drop into both eyes 2 (two) times daily.     celecoxib (CELEBREX) 200 MG capsule TAKE 1 CAPSULE BY MOUTH DAILY 90 capsule 3    Coenzyme Q10 300 MG CAPS Take 1 capsule by mouth every morning.     diclofenac Sodium (VOLTAREN) 1 % GEL Apply 1 Application topically 2 (two) times daily.     fluticasone (FLONASE) 50 MCG/ACT nasal spray Place 2 sprays into both nostrils daily. 16 g 6   levocetirizine (XYZAL) 5 MG tablet Take 1 tablet (5 mg total) by mouth every evening. 90 tablet 1   lisinopril (ZESTRIL) 5 MG tablet Take 1 tablet (5 mg total) by mouth 2 (two) times daily. 180 tablet 1   LUTEIN-ZEAXANTHIN PO Take 1 tablet by mouth every morning.     MAGNESIUM CITRATE PO Take 500 mg by mouth daily.     Multiple Vitamins-Minerals (MULTIVITAMIN WITH MINERALS) tablet Take 1 tablet by mouth every morning.     Omega-3 Fatty Acids (FISH OIL) 1000 MG CAPS Take 2,000 mg by mouth daily.     omeprazole (PRILOSEC) 20 MG capsule Take 1 capsule (20 mg total) by mouth every morning. 90 capsule 1   predniSONE (DELTASONE) 20 MG tablet Take 1 tablet (20 mg total) by mouth daily with breakfast. 5 tablet 0   sodium chloride (MURO 128) 2 % ophthalmic solution Place 1 drop into both eyes 2 (two) times daily.     SYNJARDY XR 12.08-998 MG TB24 Take 1 tablet by mouth 2 (two) times daily. 180 tablet 3   TURMERIC PO Take 2,000 mg by mouth daily.  UNABLE TO FIND Take 1 tablet by mouth daily. Cinsulin supplement     vitamin E 180 MG (400 UNITS) capsule Take 400 Units by mouth daily.     glucose blood test strip Use as instructed 100 each 12   traMADol (ULTRAM) 50 MG tablet Take 1 tablet (50 mg total) by mouth every 6 (six) hours as needed for moderate pain. (Patient not taking: Reported on 06/20/2023) 30 tablet 0   No current facility-administered medications for this visit.    Allergies: Codeine, Benzonatate, and Epinephrine  Past Medical History:  Diagnosis Date   Allergy    Anxiety    Arthritis    hands   Cataract    Diabetes mellitus without complication (HCC)    type 2   Family history of adverse reaction to anesthesia    son has  malignant hyperthermia, daughter does not daughter recently had c section without problems   GERD (gastroesophageal reflux disease)    Headache    sinus   Hyperlipidemia    Hypertension    PONV (postoperative nausea and vomiting)    nausea only   Ulcer, stomach peptic yrs ago    Past Surgical History:  Procedure Laterality Date   APPENDECTOMY     both hells bone spur repair     both heels with metal clips   both shoulder rotator cuff repair     CESAREAN SECTION     x 1   COLONOSCOPY WITH PROPOFOL N/A 09/07/2016   Procedure: COLONOSCOPY WITH PROPOFOL;  Surgeon: Charolett Bumpers, MD;  Location: [facility];  Service: Endoscopy;  Laterality: N/A;   colonscopy  06/2011   polyps   EYE SURGERY     FRACTURE SURGERY     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/10/2022   Procedure: REVERSE SHOULDER ARTHROPLASTY;  Surgeon: Francena Hanly, MD;  Location: [facility];  Service: Orthopedics;  Laterality: Right;  Please follow in room 6 if able   TUBAL LIGATION     VESICO-VAGINAL FISTULA REPAIR      Family History  Problem Relation Age of Onset   Hypertension Mother    COPD Mother    Cancer Mother    Arthritis Mother    Hypertension Father    Hyperlipidemia Father    Diabetes Father    Cancer Father    Alcohol abuse Father    Diabetes Brother    Alcohol abuse Brother    Hypertension Brother    Arthritis Maternal Grandmother    Birth defects Maternal Grandmother    Diabetes Paternal Grandmother    Heart disease Paternal Grandfather    Diabetes Paternal Aunt    Diabetes Paternal Aunt    Obesity Son     Social History   Tobacco Use   Smoking status: Former    Current packs/day: 1.00    Average packs/day: 1 pack/day for 14.0 years (14.0 ttl pk-yrs)    Types: Cigarettes   Smokeless tobacco: Never   Tobacco comments:    quit 31 yrs ago  Substance Use Topics   Alcohol use: Yes    Comment: rare    Subjective:   Treated for acute sinus infection on 06/09/23 with Z-pak; is  feeling much better except left ear still feels full/ clogged; known history of problems with eustachian tubes being small;    Objective:  Vitals:   06/20/23 1035  BP: 124/62  Pulse: 75  Temp: 97.8 F (36.6 C)  TempSrc: Oral  SpO2: 97%  Weight: 193 lb 9.6  oz (87.8 kg)  Height: 5\' 1"  (1.549 m)    General: Well developed, well nourished, in no acute distress  Skin : Warm and dry.  Head: Normocephalic and atraumatic  Eyes: Sclera and conjunctiva clear; pupils round and reactive to light; extraocular movements intact  Ears: External normal; canals clear; tympanic membranes congested Oropharynx: Pink, supple. No suspicious lesions  Neck: Supple without thyromegaly, adenopathy  Lungs: Respirations unlabored; clear to auscultation bilaterally without wheeze, rales, rhonchi  CVS exam: normal rate and regular rhythm.  Neurologic: Alert and oriented; speech intact; face symmetrical; moves all extremities well; CNII-XII intact without focal deficit   Assessment:  1. Dysfunction of left eustachian tube   2. Type 2 diabetes mellitus without complication, unspecified whether long term insulin use (HCC)     Plan:  Trial of Prednisone 20 mg every day x 5 days, Rx for Flonase NS- can use with some regularity going forward to help manage symptoms; Refill on test strips updated as requested;   No follow-ups on file.  No orders of the defined types were placed in this encounter.   Requested Prescriptions   Signed Prescriptions Disp Refills   glucose blood test strip 100 each 12    Sig: Use as instructed   predniSONE (DELTASONE) 20 MG tablet 5 tablet 0    Sig: Take 1 tablet (20 mg total) by mouth daily with breakfast.   fluticasone (FLONASE) 50 MCG/ACT nasal spray 16 g 6    Sig: Place 2 sprays into both nostrils daily.

## 2023-06-22 ENCOUNTER — Encounter: Payer: Self-pay | Admitting: Physical Therapy

## 2023-06-22 ENCOUNTER — Ambulatory Visit: Payer: Medicare Other | Admitting: Occupational Therapy

## 2023-06-22 ENCOUNTER — Ambulatory Visit: Payer: Medicare Other | Admitting: Physical Therapy

## 2023-06-22 DIAGNOSIS — M25611 Stiffness of right shoulder, not elsewhere classified: Secondary | ICD-10-CM | POA: Diagnosis not present

## 2023-06-22 DIAGNOSIS — M542 Cervicalgia: Secondary | ICD-10-CM

## 2023-06-22 DIAGNOSIS — M6281 Muscle weakness (generalized): Secondary | ICD-10-CM

## 2023-06-22 DIAGNOSIS — R519 Headache, unspecified: Secondary | ICD-10-CM | POA: Diagnosis not present

## 2023-06-22 DIAGNOSIS — M25511 Pain in right shoulder: Secondary | ICD-10-CM | POA: Diagnosis not present

## 2023-06-22 DIAGNOSIS — M79641 Pain in right hand: Secondary | ICD-10-CM

## 2023-06-22 DIAGNOSIS — M79642 Pain in left hand: Secondary | ICD-10-CM

## 2023-06-22 DIAGNOSIS — R278 Other lack of coordination: Secondary | ICD-10-CM | POA: Diagnosis not present

## 2023-06-22 DIAGNOSIS — M25642 Stiffness of left hand, not elsewhere classified: Secondary | ICD-10-CM | POA: Diagnosis not present

## 2023-06-22 DIAGNOSIS — M25641 Stiffness of right hand, not elsewhere classified: Secondary | ICD-10-CM

## 2023-06-22 DIAGNOSIS — R600 Localized edema: Secondary | ICD-10-CM | POA: Diagnosis not present

## 2023-06-22 DIAGNOSIS — G8929 Other chronic pain: Secondary | ICD-10-CM | POA: Diagnosis not present

## 2023-06-22 NOTE — Patient Instructions (Signed)
 Marland Kitchen

## 2023-06-22 NOTE — Therapy (Signed)
 OUTPATIENT OCCUPATIONAL THERAPY ORTHO Treatment  Patient Name: Veronica Galvan MRN: [id number] DOB:26-Oct-1955, 68 y.o., female Today's Date: 06/22/2023  PCP: Olive Bass [name] REFERRING PROVIDER: Olive Bass [name]  END OF SESSION:  OT End of Session - 06/22/23 1411     Visit Number 6    Number of Visits 25    Date for OT Re-Evaluation 08/16/23    Authorization Type [employer] MCR    Authorization - Visit Number 4    Progress Note Due on Visit 10    OT Start Time 1402    OT Stop Time 1445    OT Time Calculation (min) 43 min    Activity Tolerance Patient tolerated treatment well    Behavior During Therapy WFL for tasks assessed/performed                 Past Medical History:  Diagnosis Date   Allergy    Anxiety    Arthritis    hands   Cataract    Diabetes mellitus without complication (HCC)    type 2   Family history of adverse reaction to anesthesia    son has malignant hyperthermia, daughter does not daughter recently had c section without problems   GERD (gastroesophageal reflux disease)    Headache    sinus   Hyperlipidemia    Hypertension    PONV (postoperative nausea and vomiting)    nausea only   Ulcer, stomach peptic yrs ago   Past Surgical History:  Procedure Laterality Date   APPENDECTOMY     both hells bone spur repair     both heels with metal clips   both shoulder rotator cuff repair     CESAREAN SECTION     x 1   COLONOSCOPY WITH PROPOFOL N/A 09/07/2016   Procedure: COLONOSCOPY WITH PROPOFOL;  Surgeon: Charolett Bumpers, MD;  Location: [facility];  Service: Endoscopy;  Laterality: N/A;   colonscopy  06/2011   polyps   EYE SURGERY     FRACTURE SURGERY     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/10/2022   Procedure: REVERSE SHOULDER ARTHROPLASTY;  Surgeon: Francena Hanly, MD;  Location: [facility];  Service: Orthopedics;  Laterality: Right;  Please follow in room 6 if able   TUBAL LIGATION     VESICO-VAGINAL FISTULA REPAIR      Patient Active Problem List   Diagnosis Date Noted   Gastroesophageal reflux disease 11/16/2021   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 08/18/2021   Dermatofibroma 08/18/2021   History of malignant neoplasm of skin 08/18/2021   Lentigo 08/18/2021   Melanocytic nevi of trunk 08/18/2021   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   Rosacea 08/18/2021   Actinic keratosis 08/18/2021   Sensorineural hearing loss (SNHL) of left ear with unrestricted hearing of right ear 07/26/2021   Tinnitus of left ear 07/26/2021   Acute recurrent maxillary sinusitis 06/22/2021   Primary hypertension 01/12/2021   Hyperlipidemia associated with type 2 diabetes mellitus (HCC) 01/12/2021   Arthritis 01/12/2021   Type II diabetes mellitus (HCC) 11/25/2019   Ingrown toenail 09/05/2018   Neck pain 01/29/2018   Tick bite 10/24/2017    ONSET DATE: 05/19/23  REFERRING DIAG:  Diagnosis  M79.641,M79.642 (ICD-10-CM) - Bilateral hand pain    THERAPY DIAG:  Muscle weakness (generalized)  Stiffness of right hand, not elsewhere classified  Other lack of coordination  Pain in left hand  Pain in right hand  Stiffness of left hand, not elsewhere classified  Rationale for Evaluation and Treatment: Rehabilitation  SUBJECTIVE:   SUBJECTIVE STATEMENT: Pt reports she has been wearing her splints for 1 hour 2x day Pt accompanied by: self  PERTINENT HISTORY: 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 Yes: NPRS scale: 3-/10 Pain location: bilateral hands Pain description: aching, stiff Aggravating factors: gripping  Relieving factors: heat, meds Pt also has shoulder pain 4-5/10 which OT will not address as PT is working with pt. to  address.  FALLS: Has patient fallen in last 6 months? No  LIVING ENVIRONMENT: Lives with: lives alone Lives in: House/apartment Stairs: yes   PLOF: Independent  PATIENT GOALS: improve functional use of hands and learn adapted strategies for ADLs/IADLs.  NEXT MD VISIT: unknown  OBJECTIVE:  Note: Objective measures were completed at Evaluation unless otherwise noted.  HAND DOMINANCE: Right  ADLs: Overall ADLs: unable to grip credit card to remove from ATM Transfers/ambulation related to ADLs: mod I Eating: drops silverware Grooming: drops toothbrush, uses electric toothbrush, holding comb Upper body dressing: adjusting bra Lower body dressing: difficulty pulling up pants  Toileting: hygeine was difficult initally Bathing: mod I, difficult with shaving Tub shower transfers: walk in shower    FUNCTIONAL OUTCOME MEASURES: Quick Dash: 05/30/23- 75%% disability  UPPER EXTREMITY ROM:   RUE wrist flexion/ extension: 70/ 50, LUE wrist flexion/ extension: 70/ 45 Pt demonstrates grossly 50% composite flexion for right and 555 with left.   Active ROM Right eval Left eval  Thumb MCP (0-60) 45 55  Thumb IP (0-80) 25 10  Thumb Radial abd/add (0-55)     Thumb Palmar abd/add (0-45)     Thumb Opposition to Small Finger yes yes   Index MCP (0-90)     Index PIP (0-100)     Index DIP (0-70)      Long MCP (0-90)      Long PIP (0-100)      Long DIP (0-70)      Ring MCP (0-90)      Ring PIP (0-100)      Ring DIP (0-70)      Little MCP (0-90)      Little PIP (0-100)      Little DIP (0-70)      (Blank rows = not tested)   HAND FUNCTION: Grip strength: Right: 8 lbs; Left: 4 lbs,   SENSATION: Light touch: Impaired index finger LUE  EDEMA: Pt with bony defomities at PIP joints of all digits which pt reports are bone spurs, Pt also has bony deformity at DIP for right thumb, index and 5th digit and LUE small and index fingers  COGNITION: Overall cognitive status: Within  functional limits for tasks assessed   OBSERVATIONS: Pleasant female desires to increase functional use of bilateral UE's   TREATMENT DATE:06/22/23- Pt brought in her splints, the splint for left hand is fitting well. Pt can benefit from a different splint for RUE that places index finger in greater extension. Therapist fabricated an index finger splint to position PIP in extension and to address DIP deformity. Pt reports splint fits well A/ROM PIP blocking to digits of left and right hands, thumb flexion/ extension, opposition, min v.c and min facilitation Pt brought in her AE bottle openers and pt practiced opening plastic bottle. Pt was issued additional  foam grips for pens.    06/20/23 Today's therapy session was focused on fabrication of custom circumferential finger splints for bilateral index fingers to improve DIP positioning to minimize risk for additional deformity. 1 splint was fabicated for right index and 2 for left. Pt's second index finger splint also promotes finger extension at PIP joint.  Pt was instructed in splint wear, care and precautions. Pt. reports AE is helping at home. 06/14/23 paraffin x 10 mins to bilateral UE's Ice pack to Left shoulder x 10 mins for pain relief, no adverse reactions Reviewed PIP, DIP blocking exercises, finger thumb opposition and thumb flexion for bilateral UE's. Discussed recommendations for adapted strategies for ADLs including use of food processor to shred chicken. Pt reports using food processory for cutting vegetables and it worked well. Pt reports she purchased several items we discussed last visit and overall they are working well. Therapist began assessing splinting needs, oval 8's were tried but did not provide correct positioning, pt will benefit from custom finger splints in the future to improve DIP positioning.    06/06/22- Paraffin x 10 mins to bilateral UE's for pain and stiffness, no adverse reactions Reviewed A/ROM HEP issued last  visit 5-10 reps each bilateral UE's. Discussions regarding activity modification and potential AE including opening bottles/ jars, and use of food processor for chopping vegatables instead of chopping with a knife. Pt was shown some options on the internet. Discussed importance of warming up before activity with paraffin or gentle A/ROM   05/30/23- Paraffin x 10 mins to bilateral UE's for pain and stiffness, then pt was instucted in A/ROM HEP. See pt. instructions. Therapist began discussing activity modifications and AE. Pt was provided with a foam grip for increased ease with handwriting.  Pt tried using the sock aide however it was more challenging than donning the sock the traditional way.   05/24/23 eval only                                                                                                                                PATIENT EDUCATION: Education details:  reveiwed finger splint wear, care and precautions. Person educated: Patient Education method: Explanation, deonstration, v.c ,  Education comprehension: verbalized understanding, returned demonstration  HOME EXERCISE PROGRAM: AROM  GOALS: Goals reviewed with patient? Yes  SHORT TERM GOALS: Target date: 06/24/23  I with HEP  Goal status:  met 06/07/23  2.  I with positioning/ splinting prn to minimize pain and defomity   Goal status:ongoing inital splints, issued.  3.  Pt will increase bilateral grip strength by 5 lbs for increased UE functional use. Baseline: 05/30/23- RUE 8 lbs, LUE 4 lbs Goal status: INITIAL    LONG TERM GOALS: Target date: 08/16/23  I with updated HEP  Goal status: INITIAL  2.  Pt will improve Quick Dash score to 70% disability Baseline: 75% (05/30/23) Goal status: INITIAL  3.  Pt will demonstrate at least  65% composte flexion for LUE for increased functional use.  Goal status: INITIAL  4.  I with adapted strategies/ adapted equipment to minimize pain and to increase pt I  with ADLs/IADLs  Goal status: INITIAL  5.  Pt will demonstrate at least 65% composite flexion for RUE for increased functional use.  Goal status: INITIAL    ASSESSMENT:  CLINICAL IMPRESSION: Patient is progressing towards goals.  She verbalizes understanding of new finger splint wear care and precautions. Pt is using AE to assist with IADLs.Marland KitchenPERFORMANCE DEFICITS: in functional skills including ADLs, IADLs, coordination, sensation, edema, ROM, strength, pain, flexibility, Fine motor control, Gross motor control, endurance, decreased knowledge of precautions, decreased knowledge of use of DME, and UE functional use,, and psychosocial skills including coping strategies, environmental adaptation, habits, interpersonal interactions, and routines and behaviors.   IMPAIRMENTS: are limiting patient from ADLs, IADLs, rest and sleep, education, play, leisure, and social participation.   COMORBIDITIES: may have co-morbidities  that affects occupational performance. Patient will benefit from skilled OT to address above impairments and improve overall function.  MODIFICATION OR ASSISTANCE TO COMPLETE EVALUATION: No modification of tasks or assist necessary to complete an evaluation.  OT OCCUPATIONAL PROFILE AND HISTORY: Detailed assessment: Review of records and additional review of physical, cognitive, psychosocial history related to current functional performance.  CLINICAL DECISION MAKING: LOW - limited treatment options, no task modification necessary  REHAB POTENTIAL: Good  EVALUATION COMPLEXITY: Low      PLAN:  OT FREQUENCY: 2x/week plus eval  OT DURATION: 12 weeks  PLANNED INTERVENTIONS: 97168 OT Re-evaluation, 97535 self care/ADL training, 91478 therapeutic exercise, 97530 therapeutic activity, 97112 neuromuscular re-education, 97140 manual therapy, 97113 aquatic therapy, 97035 ultrasound, 97018 paraffin, 29562 fluidotherapy, 97010 moist heat, 97010 cryotherapy, 97034 contrast bath,  97760 Orthotics management and training, 13086 Splinting (initial encounter), [id number] Subsequent splinting/medication, passive range of motion, energy conservation, coping strategies training, patient/family education, and DME and/or AE instructions  RECOMMENDED OTHER SERVICES: none  CONSULTED AND AGREED WITH PLAN OF CARE: Patient  PLAN FOR NEXT SESSION: splint check, paraffin, ROM  ADL strategies   [place] Searles, OT 06/22/2023, 2:49 PM

## 2023-06-22 NOTE — Therapy (Signed)
 OUTPATIENT PHYSICAL THERAPY SHOULDER    Patient Name: Veronica Galvan MRN: [id number] DOB:[birth year], 68 y.o., female Today's Date: 06/22/2023  END OF SESSION:  PT End of Session - 06/22/23 1454     Visit Number 51    Date for PT Re-Evaluation 07/04/23    Authorization Type [employer] [place]    PT Start Time 1445    PT Stop Time 1539    PT Time Calculation (min) 54 min    Activity Tolerance Patient tolerated treatment well    Behavior During Therapy WFL for tasks assessed/performed             Past Medical History:  Diagnosis Date   Allergy    Anxiety    Arthritis    hands   Cataract    Diabetes mellitus without complication (HCC)    type 2   Family history of adverse reaction to anesthesia    son has malignant hyperthermia, daughter does not daughter recently had c section without problems   GERD (gastroesophageal reflux disease)    Headache    sinus   Hyperlipidemia    Hypertension    PONV (postoperative nausea and vomiting)    nausea only   Ulcer, stomach peptic yrs ago   Past Surgical History:  Procedure Laterality Date   APPENDECTOMY     both hells bone spur repair     both heels with metal clips   both shoulder rotator cuff repair     CESAREAN SECTION     x 1   COLONOSCOPY WITH PROPOFOL N/A 09/07/2016   Procedure: COLONOSCOPY WITH PROPOFOL;  Surgeon: Charolett Bumpers, MD;  Location: [facility];  Service: Endoscopy;  Laterality: N/A;   colonscopy  06/2011   polyps   EYE SURGERY     FRACTURE SURGERY     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/10/2022   Procedure: REVERSE SHOULDER ARTHROPLASTY;  Surgeon: Francena Hanly, MD;  Location: [facility];  Service: Orthopedics;  Laterality: Right;  Please follow in room 6 if able   TUBAL LIGATION     VESICO-VAGINAL FISTULA REPAIR     Patient Active Problem List   Diagnosis Date Noted   Gastroesophageal reflux disease 11/16/2021   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 08/18/2021    Dermatofibroma 08/18/2021   History of malignant neoplasm of skin 08/18/2021   Lentigo 08/18/2021   Melanocytic nevi of trunk 08/18/2021   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   Rosacea 08/18/2021   Actinic keratosis 08/18/2021   Sensorineural hearing loss (SNHL) of left ear with unrestricted hearing of right ear 07/26/2021   Tinnitus of left ear 07/26/2021   Acute recurrent maxillary sinusitis 06/22/2021   Primary hypertension 01/12/2021   Hyperlipidemia associated with type 2 diabetes mellitus (HCC) 01/12/2021   Arthritis 01/12/2021   Type II diabetes mellitus (HCC) 11/25/2019   Ingrown toenail 09/05/2018   Neck pain 01/29/2018   Tick bite 10/24/2017    PCP: Dayton Scrape, [name]  REFERRING PROVIDER: Rennis Chris, MD  REFERRING DIAG: s/p right reverse TSA  THERAPY DIAG:  Muscle weakness (generalized)  Stiffness of right shoulder, not elsewhere classified  Localized edema  Acute pain of right shoulder  Cervicalgia  Rationale for Evaluation and Treatment: Rehabilitation  ONSET DATE: 11/05/22  SUBJECTIVE:  SUBJECTIVE STATEMENT:  Patient reports that she ended up with norovirus, she reports feeling very poorly.  Reports very weak  PAIN:  Are you having pain? Yes: NPRS scale: 7/10 Pain location: right shoulder, HA Pain description: dull ache at rest, sharp with motions Aggravating factors: quick motions, movements pain up to 10/10 Relieving factors: ice, rest, Tylenol at best 3/10  PRECAUTIONS: Shoulder Protocol in the chart  RED FLAGS: None   WEIGHT BEARING RESTRICTIONS: No  FALLS:  Has patient fallen in last 6 months? Yes. Number of falls 1  LIVING ENVIRONMENT: Lives with: lives alone Lives in: House/apartment Stairs: No Has following equipment at home:  None  OCCUPATION: retired  PLOF: Independent  PATIENT GOALS:dress without difficulty, do hair, have good ROM and less pain  NEXT MD VISIT:   OBJECTIVE:   DIAGNOSTIC FINDINGS:  See above  PATIENT SURVEYS:  FOTO 21  COGNITION: Overall cognitive status: Within functional limits for tasks assessed     SENSATION: WFL  POSTURE: Fwd head, rounded shoulders, elevated and guarded shoulder  UPPER EXTREMITY ROM:   Active ROM Right PROM eval Right PROM Supine 01/05/23 PROM  01/26/23 PROM 02/09/23 AROM sitting 02/14/23 AROM 02/21/23 AROM  03/02/23 AROM  03/22/23 AAROM 03/28/23 AROM Standing 04/13/23 AROM Standing 05/02/23 AROM  Standing 05/11/23 AROM  Standing 05/25/23 AROM Standing 06/01/23  Shoulder flexion 35  118  123 90 96 95 100 111 110 112 120 125  129  Shoulder extension 5                Shoulder abduction 20  90 92  73 80 83 90 93 90 92 98 100 102   Shoulder adduction                 Shoulder internal rotation 15             40 40  Shoulder external rotation 20  45 30  41  45 50 53 56 56 60 66 67  Elbow flexion 120                Elbow extension 5                Wrist flexion                 Wrist extension                 Wrist ulnar deviation                 Wrist radial deviation                 Wrist pronation                 Wrist supination                 (Blank rows = not tested)  UPPER EXTREMITY MMT:  No tested due to recent surgery  MMT Right eval Left eval  Shoulder flexion    Shoulder extension    Shoulder abduction    Shoulder adduction    Shoulder internal rotation    Shoulder external rotation    Middle trapezius    Lower trapezius    Elbow flexion    Elbow extension    Wrist flexion    Wrist extension    Wrist ulnar deviation    Wrist radial deviation    Wrist pronation    Wrist supination    Grip strength (lbs)    (Blank  rows = not tested)  PALPATION:  Very tight and tender in the pectoral, upper trap, the entire right upper  arm   TODAY'S TREATMENT:                                                                                                                                         DATE:  06/22/23 UBE level 4 x 6 minutes 5# chest press 2# wate bar chest press, flexion to shoulder and behind back PROM of the right shoulder all motions STM to the right deltoid and bicep area Vaso right shoulder 34 degrees medium pressure  06/13/23 UBE level 4 x 6 minutes Wall slides Stick exercises with some passive stretch Supine 2# and 3# exercises to increase strength and function Supine passive stretch of the right shoulder all motions to her tolerance some stretch of the pectoral, and cross body stretch with star gazer stretch STM to the right upper arm and the right upper trap, neck and rhomboid, a lot of trigger points in the rhomboid and upper trap that referred pain to the head and down the right arm Vaso medium pressure 34 degrees  06/08/23 UBE level 4 x 6 minutes chest press 5# Lats 15# Wall slides Supine PROM/joint mobs STM to the neck upper trap, rhomboid and upper arm Vaso medium pressure 35 degrees  06/06/23 UBE level 4 x 6 minutes PROM all motions in supine, included cross body stretch and star gazer STM to the right upper arm Vaso medium pressure  06/01/23 UBE LEvel 4 x 6 minutes Wall slides, circles Supine 3# wate bar chest press and then flexion 3# isometric circles 3# serratus punch Passive stretch right shoulder all motions ROM measured as above Vaso medium pressure 34 degrees  05/30/23 UBE level 3 x 6 minutes Red tband ER/IR Isometrics all shoulder motions Supine punches and flexion STM to the neck, upper trap, rhomboid and the right upper arm Passive stretch to the right shoulder  Vaso Medium pressure 35 degrees  05/25/23 Nustep level 4 push and pull and then cross body push and pull 3# wate bar extension Body blade 20 seconds all motions 2x20 seconds Passive stretch all  motions STM to the right upper arm Vaso medium pressure 34 degrees  05/23/23 Nustep push and pull front to back and across body total 5 minutes Seated row 20# Lats 20# Chest press 5# 25# triceps 10# biceps 3# wate bar overhead press 3# wate bar extension Supine wate bare chest press 3# chest press 3# ER/IR 3# wate bar flexion in supine Passive stretch all motions STM to the right upper arm Vaso medium pressure 34 degrees   PATIENT EDUCATION: Education details: poc/hep Person educated: Patient Education method: Programmer, multimedia, Facilities manager, Actor cues, Verbal cues, and Handouts Education comprehension: verbalized understanding  HOME EXERCISE PROGRAM: Access Code: [email or web address] URL: [email or web address] Date: 12/20/2022 Prepared by: Stacie Glaze  Exercises - Seated Shoulder Flexion  Towel Slide at Table Top  - 2 x daily - 7 x weekly - 2 sets - 10 reps - 3 hold - Seated Shoulder External Rotation PROM on Table  - 2 x daily - 7 x weekly - 2 sets - 10 reps - 3 hold - Seated Elbow Extension and Shoulder External Rotation AAROM at Table with Towel  - 2 x daily - 7 x weekly - 2 sets - 10 reps - 3 hold - Circular Shoulder Pendulum with Table Support  - 2 x daily - 7 x weekly - 2 sets - 10 reps - 3 hold  ASSESSMENT:  CLINICAL IMPRESSION: Patient ended up with norovirus last week and reports that she feels bad and is very weak, she continues to have the higher ratings of pain in the right upper arm, She was a little more guarded and resistant to the PROM today.  She reports that she has not attended any of her normal classes and has done much less overall, I felt like this would be good for her as I thinks she really over does it most of the time, however she is still hurting even with reports of doing less   OBJECTIVE IMPAIRMENTS: cardiopulmonary status limiting activity, decreased activity tolerance, decreased endurance, decreased ROM, decreased strength, increased  edema, increased muscle spasms, impaired flexibility, impaired UE functional use, improper body mechanics, postural dysfunction, and pain .  REHAB POTENTIAL: Good  CLINICAL DECISION MAKING: Evolving/moderate complexity  EVALUATION COMPLEXITY: Low   GOALS: Goals reviewed with patient? Yes  SHORT TERM GOALS: Target date: 01/01/23  Independent with initial HEP Goal status: 12/27/22 MET  LONG TERM GOALS: Target date: 03/22/23  Decrease pain 50% Goal status: 03/22/23 progressing  2.  Dress without difficulty Goal status: progressing 06/22/23  3.  Do hair without difficulty Goal status: able to reach the top of head at times progressing 06/22/23  4.  Increase AROM right shoulder flexion to 130 degrees Goal status: 06/01/23 progressing 120 degrees  5.  Increase right shoulder ER to 60 degrees Goal status: progressing 06/01/23  6.  Return to water aerobics and or gym activity Goal status:progressing met 06/01/23  PLAN:  PT FREQUENCY: 1-2x/week  PT DURATION: 12 weeks  PLANNED INTERVENTIONS: Therapeutic exercises, Therapeutic activity, Neuromuscular re-education, Balance training, Gait training, Patient/Family education, Self Care, Joint mobilization, Dry Needling, Electrical stimulation, Cryotherapy, Vasopneumatic device, and Manual therapy  PLAN FOR NEXT SESSION:   focus more on ROM and decrease the weights as she is so active Jearld Lesch, PT 06/22/2023, 2:55 PM [facility] Outpatient Rehabilitation at [facility] [address] [facility] [place], [place], [address] Phone: [phone]   Fax:  [phone]

## 2023-06-27 ENCOUNTER — Encounter: Payer: Self-pay | Admitting: Physical Therapy

## 2023-06-27 ENCOUNTER — Ambulatory Visit: Payer: Medicare Other | Admitting: Occupational Therapy

## 2023-06-27 ENCOUNTER — Ambulatory Visit: Payer: Medicare Other | Admitting: Physical Therapy

## 2023-06-27 DIAGNOSIS — M25611 Stiffness of right shoulder, not elsewhere classified: Secondary | ICD-10-CM | POA: Diagnosis not present

## 2023-06-27 DIAGNOSIS — M6281 Muscle weakness (generalized): Secondary | ICD-10-CM | POA: Diagnosis not present

## 2023-06-27 DIAGNOSIS — M542 Cervicalgia: Secondary | ICD-10-CM

## 2023-06-27 DIAGNOSIS — M25511 Pain in right shoulder: Secondary | ICD-10-CM

## 2023-06-27 DIAGNOSIS — M79641 Pain in right hand: Secondary | ICD-10-CM | POA: Diagnosis not present

## 2023-06-27 DIAGNOSIS — R600 Localized edema: Secondary | ICD-10-CM

## 2023-06-27 DIAGNOSIS — G8929 Other chronic pain: Secondary | ICD-10-CM | POA: Diagnosis not present

## 2023-06-27 DIAGNOSIS — R519 Headache, unspecified: Secondary | ICD-10-CM | POA: Diagnosis not present

## 2023-06-27 DIAGNOSIS — M25642 Stiffness of left hand, not elsewhere classified: Secondary | ICD-10-CM

## 2023-06-27 DIAGNOSIS — M79642 Pain in left hand: Secondary | ICD-10-CM

## 2023-06-27 DIAGNOSIS — M25641 Stiffness of right hand, not elsewhere classified: Secondary | ICD-10-CM

## 2023-06-27 DIAGNOSIS — R278 Other lack of coordination: Secondary | ICD-10-CM | POA: Diagnosis not present

## 2023-06-27 NOTE — Therapy (Signed)
 OUTPATIENT PHYSICAL THERAPY SHOULDER    Patient Name: Veronica Galvan MRN: [id number] DOB:[birth year], 68 y.o., female Today's Date: 06/27/2023  END OF SESSION:  PT End of Session - 06/27/23 0845     Visit Number 52    Date for PT Re-Evaluation 07/04/23    Authorization Type [employer] [place]    PT Start Time [phone]    PT Stop Time 0855    PT Time Calculation (min) 60 min    Activity Tolerance Patient tolerated treatment well    Behavior During Therapy WFL for tasks assessed/performed             Past Medical History:  Diagnosis Date   Allergy    Anxiety    Arthritis    hands   Cataract    Diabetes mellitus without complication (HCC)    type 2   Family history of adverse reaction to anesthesia    son has malignant hyperthermia, daughter does not daughter recently had c section without problems   GERD (gastroesophageal reflux disease)    Headache    sinus   Hyperlipidemia    Hypertension    PONV (postoperative nausea and vomiting)    nausea only   Ulcer, stomach peptic yrs ago   Past Surgical History:  Procedure Laterality Date   APPENDECTOMY     both hells bone spur repair     both heels with metal clips   both shoulder rotator cuff repair     CESAREAN SECTION     x 1   COLONOSCOPY WITH PROPOFOL N/A 09/07/2016   Procedure: COLONOSCOPY WITH PROPOFOL;  Surgeon: Charolett Bumpers, MD;  Location: [facility];  Service: Endoscopy;  Laterality: N/A;   colonscopy  06/2011   polyps   EYE SURGERY     FRACTURE SURGERY     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/10/2022   Procedure: REVERSE SHOULDER ARTHROPLASTY;  Surgeon: Francena Hanly, MD;  Location: [facility];  Service: Orthopedics;  Laterality: Right;  Please follow in room 6 if able   TUBAL LIGATION     VESICO-VAGINAL FISTULA REPAIR     Patient Active Problem List   Diagnosis Date Noted   Gastroesophageal reflux disease 11/16/2021   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 08/18/2021    Dermatofibroma 08/18/2021   History of malignant neoplasm of skin 08/18/2021   Lentigo 08/18/2021   Melanocytic nevi of trunk 08/18/2021   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   Rosacea 08/18/2021   Actinic keratosis 08/18/2021   Sensorineural hearing loss (SNHL) of left ear with unrestricted hearing of right ear 07/26/2021   Tinnitus of left ear 07/26/2021   Acute recurrent maxillary sinusitis 06/22/2021   Primary hypertension 01/12/2021   Hyperlipidemia associated with type 2 diabetes mellitus (HCC) 01/12/2021   Arthritis 01/12/2021   Type II diabetes mellitus (HCC) 11/25/2019   Ingrown toenail 09/05/2018   Neck pain 01/29/2018   Tick bite 10/24/2017    PCP: Dayton Scrape, [name]  REFERRING PROVIDER: Rennis Chris, MD  REFERRING DIAG: s/p right reverse TSA  THERAPY DIAG:  Muscle weakness (generalized)  Stiffness of right shoulder, not elsewhere classified  Localized edema  Acute pain of right shoulder  Cervicalgia  Rationale for Evaluation and Treatment: Rehabilitation  ONSET DATE: 11/05/22  SUBJECTIVE:  SUBJECTIVE STATEMENT:  Patient reports that she has tried to stay less active and busy, does report that she has had to travel to [place] numerous times the past week.  Still c/o tingling down the right arm at times  PAIN:  Are you having pain? Yes: NPRS scale: 7/10 Pain location: right shoulder, HA Pain description: dull ache at rest, sharp with motions Aggravating factors: quick motions, movements pain up to 10/10 Relieving factors: ice, rest, Tylenol at best 3/10  PRECAUTIONS: Shoulder Protocol in the chart  RED FLAGS: None   WEIGHT BEARING RESTRICTIONS: No  FALLS:  Has patient fallen in last 6 months? Yes. Number of falls 1  LIVING ENVIRONMENT: Lives with: lives alone Lives  in: House/apartment Stairs: No Has following equipment at home: None  OCCUPATION: retired  PLOF: Independent  PATIENT GOALS:dress without difficulty, do hair, have good ROM and less pain  NEXT MD VISIT:   OBJECTIVE:   DIAGNOSTIC FINDINGS:  See above  PATIENT SURVEYS:  FOTO 21  COGNITION: Overall cognitive status: Within functional limits for tasks assessed     SENSATION: WFL  POSTURE: Fwd head, rounded shoulders, elevated and guarded shoulder  UPPER EXTREMITY ROM:   Active ROM Right PROM eval Right PROM Supine 01/05/23 PROM  01/26/23 PROM 02/09/23 AROM sitting 02/14/23 AROM 02/21/23 AROM  03/02/23 AROM  03/22/23 AAROM 03/28/23 AROM Standing 04/13/23 AROM Standing 05/02/23 AROM  Standing 05/11/23 AROM  Standing 05/25/23 AROM Standing 06/01/23  Shoulder flexion 35  118  123 90 96 95 100 111 110 112 120 125  129  Shoulder extension 5                Shoulder abduction 20  90 92  73 80 83 90 93 90 92 98 100 102   Shoulder adduction                 Shoulder internal rotation 15             40 40  Shoulder external rotation 20  45 30  41  45 50 53 56 56 60 66 67  Elbow flexion 120                Elbow extension 5                Wrist flexion                 Wrist extension                 Wrist ulnar deviation                 Wrist radial deviation                 Wrist pronation                 Wrist supination                 (Blank rows = not tested)  UPPER EXTREMITY MMT:  No tested due to recent surgery  MMT Right eval Left eval  Shoulder flexion    Shoulder extension    Shoulder abduction    Shoulder adduction    Shoulder internal rotation    Shoulder external rotation    Middle trapezius    Lower trapezius    Elbow flexion    Elbow extension    Wrist flexion    Wrist extension    Wrist ulnar deviation    Wrist radial deviation  Wrist pronation    Wrist supination    Grip strength (lbs)    (Blank rows = not tested)  PALPATION:  Very  tight and tender in the pectoral, upper trap, the entire right upper arm   TODAY'S TREATMENT:                                                                                                                                         DATE:  06/27/23 UBE level 4 x 5 minutes Supine 2# and 3# serratus, isometric circles, flexion, ER/IR punches STM to the right biceps, deltoid, pectoral area, very tight and tender in the pectorals Passive stretch of the right shoulder all motions except IR, some prolonged star gazer stretch Vaso medium pressure 34 degrees  06/22/23 UBE level 4 x 6 minutes 5# chest press 2# wate bar chest press, flexion to shoulder and behind back PROM of the right shoulder all motions STM to the right deltoid and bicep area Vaso right shoulder 34 degrees medium pressure  06/13/23 UBE level 4 x 6 minutes Wall slides Stick exercises with some passive stretch Supine 2# and 3# exercises to increase strength and function Supine passive stretch of the right shoulder all motions to her tolerance some stretch of the pectoral, and cross body stretch with star gazer stretch STM to the right upper arm and the right upper trap, neck and rhomboid, a lot of trigger points in the rhomboid and upper trap that referred pain to the head and down the right arm Vaso medium pressure 34 degrees  06/08/23 UBE level 4 x 6 minutes chest press 5# Lats 15# Wall slides Supine PROM/joint mobs STM to the neck upper trap, rhomboid and upper arm Vaso medium pressure 35 degrees  06/06/23 UBE level 4 x 6 minutes PROM all motions in supine, included cross body stretch and star gazer STM to the right upper arm Vaso medium pressure  06/01/23 UBE LEvel 4 x 6 minutes Wall slides, circles Supine 3# wate bar chest press and then flexion 3# isometric circles 3# serratus punch Passive stretch right shoulder all motions ROM measured as above Vaso medium pressure 34 degrees  05/30/23 UBE level 3 x 6  minutes Red tband ER/IR Isometrics all shoulder motions Supine punches and flexion STM to the neck, upper trap, rhomboid and the right upper arm Passive stretch to the right shoulder  Vaso Medium pressure 35 degrees  05/25/23 Nustep level 4 push and pull and then cross body push and pull 3# wate bar extension Body blade 20 seconds all motions 2x20 seconds Passive stretch all motions STM to the right upper arm Vaso medium pressure 34 degrees  PATIENT EDUCATION: Education details: poc/hep Person educated: Patient Education method: Programmer, multimedia, Facilities manager, Actor cues, Verbal cues, and Handouts Education comprehension: verbalized understanding  HOME EXERCISE PROGRAM: Access Code: [email or web address] URL: [email or web address] Date: 12/20/2022 Prepared by: Stacie Glaze  Exercises - Seated  Shoulder Flexion Towel Slide at Table Top  - 2 x daily - 7 x weekly - 2 sets - 10 reps - 3 hold - Seated Shoulder External Rotation PROM on Table  - 2 x daily - 7 x weekly - 2 sets - 10 reps - 3 hold - Seated Elbow Extension and Shoulder External Rotation AAROM at Table with Towel  - 2 x daily - 7 x weekly - 2 sets - 10 reps - 3 hold - Circular Shoulder Pendulum with Table Support  - 2 x daily - 7 x weekly - 2 sets - 10 reps - 3 hold  ASSESSMENT:  CLINICAL IMPRESSION: Patient continues to have tingling in the right arm, I did a little more work on the right pectoral and she was very tender and very tight here.  She seemed to have some replication of the tingling with some pressure along the pectoral and the scar area.  Her ROM does seem to be getting better she does report fatigues easily even with eating a salad  OBJECTIVE IMPAIRMENTS: cardiopulmonary status limiting activity, decreased activity tolerance, decreased endurance, decreased ROM, decreased strength, increased edema, increased muscle spasms, impaired flexibility, impaired UE functional use, improper body mechanics, postural  dysfunction, and pain .  REHAB POTENTIAL: Good  CLINICAL DECISION MAKING: Evolving/moderate complexity  EVALUATION COMPLEXITY: Low   GOALS: Goals reviewed with patient? Yes  SHORT TERM GOALS: Target date: 01/01/23  Independent with initial HEP Goal status: 12/27/22 MET  LONG TERM GOALS: Target date: 03/22/23  Decrease pain 50% Goal status: 03/22/23 progressing  2.  Dress without difficulty Goal status: progressing 06/22/23  3.  Do hair without difficulty Goal status: able to reach the top of head at times progressing 06/22/23  4.  Increase AROM right shoulder flexion to 130 degrees Goal status: 06/01/23 progressing 120 degrees  5.  Increase right shoulder ER to 60 degrees Goal status: progressing 06/01/23  6.  Return to water aerobics and or gym activity Goal status:progressing met 06/01/23  PLAN:  PT FREQUENCY: 1-2x/week  PT DURATION: 12 weeks  PLANNED INTERVENTIONS: Therapeutic exercises, Therapeutic activity, Neuromuscular re-education, Balance training, Gait training, Patient/Family education, Self Care, Joint mobilization, Dry Needling, Electrical stimulation, Cryotherapy, Vasopneumatic device, and Manual therapy  PLAN FOR NEXT SESSION:   focus more on ROM and decrease the weights as she is so active Jearld Lesch, PT 06/27/2023, 8:45 AM [place] Outpatient Rehabilitation at [facility] [address] [facility]. [place], [place], [address] Phone: [phone]   Fax:  [phone]

## 2023-06-28 NOTE — Therapy (Signed)
 OUTPATIENT OCCUPATIONAL THERAPY ORTHO Treatment  Patient Name: Veronica Galvan MRN: [id number] DOB:[birth year], 68 y.o., female Today's Date: 06/28/2023  PCP: Olive Bass [name] REFERRING PROVIDER: Olive Bass [name]  END OF SESSION:  OT End of Session - 06/27/23 1236     Visit Number 7    Number of Visits 25    Date for OT Re-Evaluation 08/16/23    Authorization Type [employer] MCR    Authorization - Visit Number 5    Progress Note Due on Visit 10    OT Start Time 1233    OT Stop Time 1313    OT Time Calculation (min) 40 min    Activity Tolerance Patient tolerated treatment well    Behavior During Therapy WFL for tasks assessed/performed                 Past Medical History:  Diagnosis Date   Allergy    Anxiety    Arthritis    hands   Cataract    Diabetes mellitus without complication (HCC)    type 2   Family history of adverse reaction to anesthesia    son has malignant hyperthermia, daughter does not daughter recently had c section without problems   GERD (gastroesophageal reflux disease)    Headache    sinus   Hyperlipidemia    Hypertension    PONV (postoperative nausea and vomiting)    nausea only   Ulcer, stomach peptic yrs ago   Past Surgical History:  Procedure Laterality Date   APPENDECTOMY     both hells bone spur repair     both heels with metal clips   both shoulder rotator cuff repair     CESAREAN SECTION     x 1   COLONOSCOPY WITH PROPOFOL N/A 09/07/2016   Procedure: COLONOSCOPY WITH PROPOFOL;  Surgeon: Charolett Bumpers, MD;  Location: [facility];  Service: Endoscopy;  Laterality: N/A;   colonscopy  06/2011   polyps   EYE SURGERY     FRACTURE SURGERY     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/10/2022   Procedure: REVERSE SHOULDER ARTHROPLASTY;  Surgeon: Francena Hanly, MD;  Location: [facility];  Service: Orthopedics;  Laterality: Right;  Please follow in room 6 if able   TUBAL LIGATION     VESICO-VAGINAL FISTULA REPAIR      Patient Active Problem List   Diagnosis Date Noted   Gastroesophageal reflux disease 11/16/2021   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 08/18/2021   Dermatofibroma 08/18/2021   History of malignant neoplasm of skin 08/18/2021   Lentigo 08/18/2021   Melanocytic nevi of trunk 08/18/2021   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   Rosacea 08/18/2021   Actinic keratosis 08/18/2021   Sensorineural hearing loss (SNHL) of left ear with unrestricted hearing of right ear 07/26/2021   Tinnitus of left ear 07/26/2021   Acute recurrent maxillary sinusitis 06/22/2021   Primary hypertension 01/12/2021   Hyperlipidemia associated with type 2 diabetes mellitus (HCC) 01/12/2021   Arthritis 01/12/2021   Type II diabetes mellitus (HCC) 11/25/2019   Ingrown toenail 09/05/2018   Neck pain 01/29/2018   Tick bite 10/24/2017    ONSET DATE: 05/19/23  REFERRING DIAG:  Diagnosis  M79.641,M79.642 (ICD-10-CM) - Bilateral hand pain    THERAPY DIAG:  Muscle weakness (generalized)  Stiffness of right shoulder, not elsewhere classified  Localized edema  Acute pain of right shoulder  Stiffness of right hand, not elsewhere classified  Other lack of coordination  Pain  in left hand  Pain in right hand  Stiffness of left hand, not elsewhere classified  Rationale for Evaluation and Treatment: Rehabilitation  SUBJECTIVE:   SUBJECTIVE STATEMENT: Pt reports she has been wearing her splints for 1 hour 2x day Pt accompanied by: self  PERTINENT HISTORY: 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 Yes: NPRS scale: 3/10 Pain location: bilateral hands Pain description: aching, stiff Aggravating factors: gripping  Relieving factors:  heat, meds Pt also has shoulder pain 4-5/10 which OT will not address as PT is working with pt. to address.  FALLS: Has patient fallen in last 6 months? No  LIVING ENVIRONMENT: Lives with: lives alone Lives in: House/apartment Stairs: yes   PLOF: Independent  PATIENT GOALS: improve functional use of hands and learn adapted strategies for ADLs/IADLs.  NEXT MD VISIT: unknown  OBJECTIVE:  Note: Objective measures were completed at Evaluation unless otherwise noted.  HAND DOMINANCE: Right  ADLs: Overall ADLs: unable to grip credit card to remove from ATM Transfers/ambulation related to ADLs: mod I Eating: drops silverware Grooming: drops toothbrush, uses electric toothbrush, holding comb Upper body dressing: adjusting bra Lower body dressing: difficulty pulling up pants  Toileting: hygeine was difficult initally Bathing: mod I, difficult with shaving Tub shower transfers: walk in shower    FUNCTIONAL OUTCOME MEASURES: Quick Dash: 05/30/23- 75%% disability  UPPER EXTREMITY ROM:   RUE wrist flexion/ extension: 70/ 50, LUE wrist flexion/ extension: 70/ 45 Pt demonstrates grossly 50% composite flexion for right and 555 with left.   Active ROM Right eval Left eval  Thumb MCP (0-60) 45 55  Thumb IP (0-80) 25 10  Thumb Radial abd/add (0-55)     Thumb Palmar abd/add (0-45)     Thumb Opposition to Small Finger yes yes   Index MCP (0-90)     Index PIP (0-100)     Index DIP (0-70)      Long MCP (0-90)      Long PIP (0-100)      Long DIP (0-70)      Ring MCP (0-90)      Ring PIP (0-100)      Ring DIP (0-70)      Little MCP (0-90)      Little PIP (0-100)      Little DIP (0-70)      (Blank rows = not tested)   HAND FUNCTION: Grip strength: Right: 8 lbs; Left: 4 lbs,   SENSATION: Light touch: Impaired index finger LUE  EDEMA: Pt with bony defomities at PIP joints of all digits which pt reports are bone spurs, Pt also has bony deformity at DIP for right thumb, index  and 5th digit and LUE small and index fingers  COGNITION: Overall cognitive status: Within functional limits for tasks assessed   OBSERVATIONS: Pleasant female desires to increase functional use of bilateral UE's   TREATMENT DATE:06/27/23- Paraffin to left hand x 10 mins while pt perfromed A/ROM  for RUE: blocking exercises for each digit, thumb flexion extension, followed by P/ROM composite flexion to digits individually and compositely. Pt demonstrates improved composite finger flexion end of session. Paraffin to RUE x 10 mins  while LUE A/ROM blocking exercises for each digit, thumb flexion extension, followed by P/ROM composite flexion to digits individually and compositely.  Pt will improved composite finger flexion end of session.  06/22/23- Pt brought in her splints, the splint for left hand is fitting well. Pt can benefit from a different splint for RUE that places index finger in greater extension. Therapist fabricated an index finger splint to position PIP in extension and to address DIP deformity. Pt reports splint fits well A/ROM PIP blocking to digits of left and right hands, thumb flexion/ extension, opposition, min v.c and min facilitation Pt brought in her AE bottle openers and pt practiced opening plastic bottle. Pt was issued additional foam grips for pens.    06/20/23 Today's therapy session was focused on fabrication of custom circumferential finger splints for bilateral index fingers to improve DIP positioning to minimize risk for additional deformity. 1 splint was fabicated for right index and 2 for left. Pt's second index finger splint also promotes finger extension at PIP joint.  Pt was instructed in splint wear, care and precautions. Pt. reports AE is helping at home. 06/14/23 paraffin x 10 mins to bilateral UE's Ice pack to Left shoulder x 10 mins for pain relief, no adverse reactions Reviewed PIP, DIP blocking exercises, finger thumb opposition and thumb flexion for  bilateral UE's. Discussed recommendations for adapted strategies for ADLs including use of food processor to shred chicken. Pt reports using food processory for cutting vegetables and it worked well. Pt reports she purchased several items we discussed last visit and overall they are working well. Therapist began assessing splinting needs, oval 8's were tried but did not provide correct positioning, pt will benefit from custom finger splints in the future to improve DIP positioning.    06/06/22- Paraffin x 10 mins to bilateral UE's for pain and stiffness, no adverse reactions Reviewed A/ROM HEP issued last visit 5-10 reps each bilateral UE's. Discussions regarding activity modification and potential AE including opening bottles/ jars, and use of food processor for chopping vegatables instead of chopping with a knife. Pt was shown some options on the internet. Discussed importance of warming up before activity with paraffin or gentle A/ROM   05/30/23- Paraffin x 10 mins to bilateral UE's for pain and stiffness, then pt was instucted in A/ROM HEP. See pt. instructions. Therapist began discussing activity modifications and AE. Pt was provided with a foam grip for increased ease with handwriting.  Pt tried using the sock aide however it was more challenging than donning the sock the traditional way.   05/24/23 eval only                                                                                                                                PATIENT EDUCATION: Education details:  reveiwed finger splint wear, care and precautions. Person educated: Patient Education method: Explanation, deonstration, v.c ,  Education comprehension: verbalized understanding, returned demonstration  HOME EXERCISE PROGRAM: AROM  GOALS: Goals reviewed with patient? Yes  SHORT TERM GOALS: Target date: 06/24/23  I with HEP  Goal status:  met 06/07/23  2.  I with positioning/ splinting prn to minimize pain  and defomity   Goal status:ongoing inital splints, issued.  3.  Pt will increase bilateral grip strength by 5 lbs for increased UE functional use. Baseline: 05/30/23- RUE 8 lbs, LUE 4 lbs Goal status: INITIAL    LONG TERM GOALS: Target date: 08/16/23  I with updated HEP  Goal status: INITIAL  2.  Pt will improve Quick Dash score to 70% disability Baseline: 75% (05/30/23) Goal status: INITIAL  3.  Pt will demonstrate at least 65% composte flexion for LUE for increased functional use.  Goal status: INITIAL  4.  I with adapted strategies/ adapted equipment to minimize pain and to increase pt I with ADLs/IADLs  Goal status: INITIAL  5.  Pt will demonstrate at least 65% composite flexion for RUE for increased functional use.  Goal status: INITIAL    ASSESSMENT:  CLINICAL IMPRESSION: Patient is progressing towards goals. Pt demonstrates improved composite finger flexion and flexibility at end of session.PERFORMANCE DEFICITS: in functional skills including ADLs, IADLs, coordination, sensation, edema, ROM, strength, pain, flexibility, Fine motor control, Gross motor control, endurance, decreased knowledge of precautions, decreased knowledge of use of DME, and UE functional use,, and psychosocial skills including coping strategies, environmental adaptation, habits, interpersonal interactions, and routines and behaviors.   IMPAIRMENTS: are limiting patient from ADLs, IADLs, rest and sleep, education, play, leisure, and social participation.   COMORBIDITIES: may have co-morbidities  that affects occupational performance. Patient will benefit from skilled OT to address above impairments and improve overall function.  MODIFICATION OR ASSISTANCE TO COMPLETE EVALUATION: No modification of tasks or assist necessary to complete an evaluation.  OT OCCUPATIONAL PROFILE AND HISTORY: Detailed assessment: Review of records and additional review of physical, cognitive, psychosocial history related  to current functional performance.  CLINICAL DECISION MAKING: LOW - limited treatment options, no task modification necessary  REHAB POTENTIAL: Good  EVALUATION COMPLEXITY: Low      PLAN:  OT FREQUENCY: 2x/week plus eval  OT DURATION: 12 weeks  PLANNED INTERVENTIONS: 97168 OT Re-evaluation, 97535 self care/ADL training, 16109 therapeutic exercise, 97530 therapeutic activity, 97112 neuromuscular re-education, 97140 manual therapy, 97113 aquatic therapy, 97035 ultrasound, 97018 paraffin, 60454 fluidotherapy, 97010 moist heat, 97010 cryotherapy, 97034 contrast bath, 97760 Orthotics management and training, 09811 Splinting (initial encounter), [id number] Subsequent splinting/medication, passive range of motion, energy conservation, coping strategies training, patient/family education, and DME and/or AE instructions  RECOMMENDED OTHER SERVICES: none  CONSULTED AND AGREED WITH PLAN OF CARE: Patient  PLAN FOR NEXT SESSION: yellow putty, P/ROM, ADL strategies.   Catriona Dillenbeck, OT 06/28/2023, 12:08 PM

## 2023-06-29 ENCOUNTER — Ambulatory Visit: Payer: Medicare Other | Admitting: Occupational Therapy

## 2023-06-29 ENCOUNTER — Ambulatory Visit: Payer: Medicare Other | Admitting: Physical Therapy

## 2023-06-29 ENCOUNTER — Encounter: Payer: Self-pay | Admitting: Physical Therapy

## 2023-06-29 DIAGNOSIS — R278 Other lack of coordination: Secondary | ICD-10-CM

## 2023-06-29 DIAGNOSIS — M79642 Pain in left hand: Secondary | ICD-10-CM

## 2023-06-29 DIAGNOSIS — R600 Localized edema: Secondary | ICD-10-CM | POA: Diagnosis not present

## 2023-06-29 DIAGNOSIS — M79641 Pain in right hand: Secondary | ICD-10-CM

## 2023-06-29 DIAGNOSIS — M6281 Muscle weakness (generalized): Secondary | ICD-10-CM

## 2023-06-29 DIAGNOSIS — M25611 Stiffness of right shoulder, not elsewhere classified: Secondary | ICD-10-CM | POA: Diagnosis not present

## 2023-06-29 DIAGNOSIS — M25511 Pain in right shoulder: Secondary | ICD-10-CM | POA: Diagnosis not present

## 2023-06-29 DIAGNOSIS — M25642 Stiffness of left hand, not elsewhere classified: Secondary | ICD-10-CM

## 2023-06-29 DIAGNOSIS — M25641 Stiffness of right hand, not elsewhere classified: Secondary | ICD-10-CM

## 2023-06-29 DIAGNOSIS — R519 Headache, unspecified: Secondary | ICD-10-CM | POA: Diagnosis not present

## 2023-06-29 DIAGNOSIS — M542 Cervicalgia: Secondary | ICD-10-CM

## 2023-06-29 DIAGNOSIS — G8929 Other chronic pain: Secondary | ICD-10-CM | POA: Diagnosis not present

## 2023-06-29 NOTE — Therapy (Addendum)
 OUTPATIENT OCCUPATIONAL THERAPY ORTHO Treatment  Patient Name: Veronica Galvan MRN: [id number] DOB:February 01, 1956, 68 y.o., female Today's Date: 06/29/2023  PCP: Olive Bass [name] REFERRING PROVIDER: Olive Bass [name]  END OF SESSION:  OT End of Session - 06/29/23 0850     Visit Number 8    Number of Visits 25    Date for OT Re-Evaluation 08/16/23    Authorization Type [employer] M[employer]    Authorization - Visit Number 8    Progress Note Due on Visit 10    OT Start Time 0844    OT Stop Time 0925    OT Time Calculation (min) 41 min                 Past Medical History:  Diagnosis Date   Allergy    Anxiety    Arthritis    hands   Cataract    Diabetes mellitus without complication (HCC)    type 2   Family history of adverse reaction to anesthesia    son has malignant hyperthermia, daughter does not daughter recently had c section without problems   GERD (gastroesophageal reflux disease)    Headache    sinus   Hyperlipidemia    Hypertension    PONV (postoperative nausea and vomiting)    nausea only   Ulcer, stomach peptic yrs ago   Past Surgical History:  Procedure Laterality Date   APPENDECTOMY     both hells bone spur repair     both heels with metal clips   both shoulder rotator cuff repair     CESAREAN SECTION     x 1   COLONOSCOPY WITH PROPOFOL N/A 09/07/2016   Procedure: COLONOSCOPY WITH PROPOFOL;  Surgeon: Charolett Bumpers, MD;  Location: [facility];  Service: Endoscopy;  Laterality: N/A;   colonscopy  06/2011   polyps   EYE SURGERY     FRACTURE SURGERY     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/10/2022   Procedure: REVERSE SHOULDER ARTHROPLASTY;  Surgeon: Francena Hanly, MD;  Location: [facility];  Service: Orthopedics;  Laterality: Right;  Please follow in room 6 if able   TUBAL LIGATION     VESICO-VAGINAL FISTULA REPAIR     Patient Active Problem List   Diagnosis Date Noted   Gastroesophageal reflux disease 11/16/2021    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 08/18/2021   Dermatofibroma 08/18/2021   History of malignant neoplasm of skin 08/18/2021   Lentigo 08/18/2021   Melanocytic nevi of trunk 08/18/2021   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   Rosacea 08/18/2021   Actinic keratosis 08/18/2021   Sensorineural hearing loss (SNHL) of left ear with unrestricted hearing of right ear 07/26/2021   Tinnitus of left ear 07/26/2021   Acute recurrent maxillary sinusitis 06/22/2021   Primary hypertension 01/12/2021   Hyperlipidemia associated with type 2 diabetes mellitus (HCC) 01/12/2021   Arthritis 01/12/2021   Type II diabetes mellitus (HCC) 11/25/2019   Ingrown toenail 09/05/2018   Neck pain 01/29/2018   Tick bite 10/24/2017    ONSET DATE: 05/19/23  REFERRING DIAG:  Diagnosis  M79.641,M79.642 (ICD-10-CM) - Bilateral hand pain    THERAPY DIAG:  Muscle weakness (generalized)  Stiffness of right shoulder, not elsewhere classified  Stiffness of right hand, not elsewhere classified  Pain in right hand  Stiffness of left hand, not elsewhere classified  Pain in left hand  Other lack of coordination  Rationale for Evaluation and Treatment: Rehabilitation  SUBJECTIVE:   SUBJECTIVE  STATEMENT: Pt reports her hands felt looser after last session Pt accompanied by: self  PERTINENT HISTORY: 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 Yes: NPRS scale: 3/10 Pain location: bilateral hands Pain description: aching, stiff Aggravating factors: gripping  Relieving factors: heat, meds Pt also has shoulder pain 4-5/10 which OT will not address as PT is working with pt. to address.  FALLS: Has patient fallen in last 6 months?  No  LIVING ENVIRONMENT: Lives with: lives alone Lives in: House/apartment Stairs: yes   PLOF: Independent  PATIENT GOALS: improve functional use of hands and learn adapted strategies for ADLs/IADLs.  NEXT MD VISIT: unknown  OBJECTIVE:  Note: Objective measures were completed at Evaluation unless otherwise noted.  HAND DOMINANCE: Right  ADLs: Overall ADLs: unable to grip credit card to remove from ATM Transfers/ambulation related to ADLs: mod I Eating: drops silverware Grooming: drops toothbrush, uses electric toothbrush, holding comb Upper body dressing: adjusting bra Lower body dressing: difficulty pulling up pants  Toileting: hygeine was difficult initally Bathing: mod I, difficult with shaving Tub shower transfers: walk in shower    FUNCTIONAL OUTCOME MEASURES: Quick Dash: 05/30/23- 75%% disability  UPPER EXTREMITY ROM:   RUE wrist flexion/ extension: 70/ 50, LUE wrist flexion/ extension: 70/ 45 Pt demonstrates grossly 50% composite flexion for right and 555 with left.   Active ROM Right eval Left eval  Thumb MCP (0-60) 45 55  Thumb IP (0-80) 25 10  Thumb Radial abd/add (0-55)     Thumb Palmar abd/add (0-45)     Thumb Opposition to Small Finger yes yes   Index MCP (0-90)     Index PIP (0-100)     Index DIP (0-70)      Long MCP (0-90)      Long PIP (0-100)      Long DIP (0-70)      Ring MCP (0-90)      Ring PIP (0-100)      Ring DIP (0-70)      Little MCP (0-90)      Little PIP (0-100)      Little DIP (0-70)      (Blank rows = not tested)   HAND FUNCTION: Grip strength: Right: 8 lbs; Left: 4 lbs,   SENSATION: Light touch: Impaired index finger LUE  EDEMA: Pt with bony defomities at PIP joints of all digits which pt reports are bone spurs, Pt also has bony deformity at DIP for right thumb, index and 5th digit and LUE small and index fingers  COGNITION: Overall cognitive status: Within functional limits for tasks assessed   OBSERVATIONS:  Pleasant female desires to increase functional use of bilateral UE's   TREATMENT DATE:06/29/23-Paraffin to left hand x 10 mins while pt perfromed A/ROM  for RUE: blocking exercises for each digit, followed by P/ROM composite flexion to digits individually and compositely. Paraffin to RUE x 10 mins  while LUE A/ROM blocking exercises for each digit,  followed by P/ROM composite flexion to digits individually and compositely.  Yellow putty was issued for sustained grip bilateral UE's and tip pinch for LUE, pt returned demonstration grossly 20 reps each min v.c  Wash cloth scrunches and flattening 5-10 reps each hand, min v.c    06/27/23- Paraffin to left hand x 10 mins  while pt perfromed A/ROM  for RUE: blocking exercises for each digit, thumb flexion extension, followed by P/ROM composite flexion to digits individually and compositely. Pt demonstrates improved composite finger flexion end of session. Paraffin to RUE x 10 mins  while LUE A/ROM blocking exercises for each digit, thumb flexion extension, followed by P/ROM composite flexion to digits individually and compositely. Pt will improved composite finger flexion end of session.  06/22/23- Pt brought in her splints, the splint for left hand is fitting well. Pt can benefit from a different splint for RUE that places index finger in greater extension. Therapist fabricated an index finger splint to position PIP in extension and to address DIP deformity. Pt reports splint fits well A/ROM PIP blocking to digits of left and right hands, thumb flexion/ extension, opposition, min v.c and min facilitation Pt brought in her AE bottle openers and pt practiced opening plastic bottle. Pt was issued additional foam grips for pens.    06/20/23 Today's therapy session was focused on fabrication of custom circumferential finger splints for bilateral index fingers to improve DIP positioning to minimize risk for additional deformity. 1 splint was fabicated for right  index and 2 for left. Pt's second index finger splint also promotes finger extension at PIP joint.  Pt was instructed in splint wear, care and precautions. Pt. reports AE is helping at home. 06/14/23 paraffin x 10 mins to bilateral UE's Ice pack to Left shoulder x 10 mins for pain relief, no adverse reactions Reviewed PIP, DIP blocking exercises, finger thumb opposition and thumb flexion for bilateral UE's. Discussed recommendations for adapted strategies for ADLs including use of food processor to shred chicken. Pt reports using food processory for cutting vegetables and it worked well. Pt reports she purchased several items we discussed last visit and overall they are working well. Therapist began assessing splinting needs, oval 8's were tried but did not provide correct positioning, pt will benefit from custom finger splints in the future to improve DIP positioning.    06/06/22- Paraffin x 10 mins to bilateral UE's for pain and stiffness, no adverse reactions Reviewed A/ROM HEP issued last visit 5-10 reps each bilateral UE's. Discussions regarding activity modification and potential AE including opening bottles/ jars, and use of food processor for chopping vegatables instead of chopping with a knife. Pt was shown some options on the internet. Discussed importance of warming up before activity with paraffin or gentle A/ROM   05/30/23- Paraffin x 10 mins to bilateral UE's for pain and stiffness, then pt was instucted in A/ROM HEP. See pt. instructions. Therapist began discussing activity modifications and AE. Pt was provided with a foam grip for increased ease with handwriting.  Pt tried using the sock aide however it was more challenging than donning the sock the traditional way.   05/24/23 eval only  PATIENT EDUCATION: Education details:  yellow putty HEP- see pt  instructions Person educated: Patient Education method: Explanation, demonstration, v.c , handout Education comprehension: verbalized understanding, returned demonstration  HOME EXERCISE PROGRAM: AROM  GOALS: Goals reviewed with patient? Yes  SHORT TERM GOALS: Target date: 06/24/23  I with HEP  Goal status:  met 06/07/23  2.  I with positioning/ splinting prn to minimize pain and defomity   Goal status:ongoing inital splints, issued.  3.  Pt will increase bilateral grip strength by 5 lbs for increased UE functional use. Baseline: 05/30/23- RUE 8 lbs, LUE 4 lbs Goal status: INITIAL    LONG TERM GOALS: Target date: 08/16/23  I with updated HEP  Goal status: INITIAL  2.  Pt will improve Quick Dash score to 70% disability Baseline: 75% (05/30/23) Goal status: INITIAL  3.  Pt will demonstrate at least 65% composte flexion for LUE for increased functional use.  Goal status: INITIAL  4.  I with adapted strategies/ adapted equipment to minimize pain and to increase pt I with ADLs/IADLs  Goal status: INITIAL  5.  Pt will demonstrate at least 65% composite flexion for RUE for increased functional use.  Goal status: INITIAL    ASSESSMENT:  CLINICAL IMPRESSION: Patient is progressing towards goals. Pt demonstrates improved felxibility at end of session today. she demonstrates understanding of HEP.Marland KitchenPERFORMANCE DEFICITS: in functional skills including ADLs, IADLs, coordination, sensation, edema, ROM, strength, pain, flexibility, Fine motor control, Gross motor control, endurance, decreased knowledge of precautions, decreased knowledge of use of DME, and UE functional use,, and psychosocial skills including coping strategies, environmental adaptation, habits, interpersonal interactions, and routines and behaviors.   IMPAIRMENTS: are limiting patient from ADLs, IADLs, rest and sleep, education, play, leisure, and social participation.   COMORBIDITIES: may have co-morbidities  that  affects occupational performance. Patient will benefit from skilled OT to address above impairments and improve overall function.  MODIFICATION OR ASSISTANCE TO COMPLETE EVALUATION: No modification of tasks or assist necessary to complete an evaluation.  OT OCCUPATIONAL PROFILE AND HISTORY: Detailed assessment: Review of records and additional review of physical, cognitive, psychosocial history related to current functional performance.  CLINICAL DECISION MAKING: LOW - limited treatment options, no task modification necessary  REHAB POTENTIAL: Good  EVALUATION COMPLEXITY: Low      PLAN:  OT FREQUENCY: 2x/week plus eval  OT DURATION: 12 weeks  PLANNED INTERVENTIONS: 97168 OT Re-evaluation, 97535 self care/ADL training, 62952 therapeutic exercise, 97530 therapeutic activity, 97112 neuromuscular re-education, 97140 manual therapy, 97113 aquatic therapy, 97035 ultrasound, 97018 paraffin, 84132 fluidotherapy, 97010 moist heat, 97010 cryotherapy, 97034 contrast bath, 97760 Orthotics management and training, 44010 Splinting (initial encounter), [id number] Subsequent splinting/medication, passive range of motion, energy conservation, coping strategies training, patient/family education, and DME and/or AE instructions  RECOMMENDED OTHER SERVICES: none  CONSULTED AND AGREED WITH PLAN OF CARE: Patient  PLAN FOR NEXT SESSION: check on putty HEP, P/ROM, ADL strategies.   Deklin Bieler, OT 06/29/2023, 9:49 AM

## 2023-06-29 NOTE — Patient Instructions (Signed)
 Grip Strengthening (Resistive Putty)    Squeeze putty using thumb and all fingers. Repeat _10-20___ times. Do __1__ sessions per day.  Copyright  [employer]. All rights reserved.    Finger / Thumb Activities: Extension    Roll putty into rope shape using all fingers held straight. Hitchhike with thumb up and out.  Copyright  V[employer]. All rights reserved.  Pinch: Palmar- left hand only    Pinch putty with right thumb and each fingertip in turn. Repeat __10__ times. Do ___1_ sessions per day. Activity: Peel fruit such as lemons or oranges.* Peel stickers off surfaces.  Copyright  [employer]. All rights reserved.

## 2023-06-29 NOTE — Therapy (Signed)
 OUTPATIENT PHYSICAL THERAPY SHOULDER    Patient Name: Veronica Galvan MRN: [id number] DOB:1955/12/27, 68 y.o., female Today's Date: 06/29/2023  END OF SESSION:  PT End of Session - 06/29/23 0801     Visit Number 53    Date for PT Re-Evaluation 07/04/23    Authorization Type [employer] [place]    PT Start Time 0800    PT Stop Time 0900    PT Time Calculation (min) 60 min    Activity Tolerance Patient tolerated treatment well    Behavior During Therapy WFL for tasks assessed/performed             Past Medical History:  Diagnosis Date   Allergy    Anxiety    Arthritis    hands   Cataract    Diabetes mellitus without complication (HCC)    type 2   Family history of adverse reaction to anesthesia    son has malignant hyperthermia, daughter does not daughter recently had c section without problems   GERD (gastroesophageal reflux disease)    Headache    sinus   Hyperlipidemia    Hypertension    PONV (postoperative nausea and vomiting)    nausea only   Ulcer, stomach peptic yrs ago   Past Surgical History:  Procedure Laterality Date   APPENDECTOMY     both hells bone spur repair     both heels with metal clips   both shoulder rotator cuff repair     CESAREAN SECTION     x 1   COLONOSCOPY WITH PROPOFOL N/A 09/07/2016   Procedure: COLONOSCOPY WITH PROPOFOL;  Surgeon: Charolett Bumpers, MD;  Location: [facility];  Service: Endoscopy;  Laterality: N/A;   colonscopy  06/2011   polyps   EYE SURGERY     FRACTURE SURGERY     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/10/2022   Procedure: REVERSE SHOULDER ARTHROPLASTY;  Surgeon: Francena Hanly, MD;  Location: [facility];  Service: Orthopedics;  Laterality: Right;  Please follow in room 6 if able   TUBAL LIGATION     VESICO-VAGINAL FISTULA REPAIR     Patient Active Problem List   Diagnosis Date Noted   Gastroesophageal reflux disease 11/16/2021   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 08/18/2021    Dermatofibroma 08/18/2021   History of malignant neoplasm of skin 08/18/2021   Lentigo 08/18/2021   Melanocytic nevi of trunk 08/18/2021   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   Rosacea 08/18/2021   Actinic keratosis 08/18/2021   Sensorineural hearing loss (SNHL) of left ear with unrestricted hearing of right ear 07/26/2021   Tinnitus of left ear 07/26/2021   Acute recurrent maxillary sinusitis 06/22/2021   Primary hypertension 01/12/2021   Hyperlipidemia associated with type 2 diabetes mellitus (HCC) 01/12/2021   Arthritis 01/12/2021   Type II diabetes mellitus (HCC) 11/25/2019   Ingrown toenail 09/05/2018   Neck pain 01/29/2018   Tick bite 10/24/2017    PCP: Dayton Scrape, [name]  REFERRING PROVIDER: Rennis Chris, MD  REFERRING DIAG: s/p right reverse TSA  THERAPY DIAG:  Muscle weakness (generalized)  Stiffness of right shoulder, not elsewhere classified  Localized edema  Acute pain of right shoulder  Cervicalgia  Rationale for Evaluation and Treatment: Rehabilitation  ONSET DATE: 11/05/22  SUBJECTIVE:  SUBJECTIVE STATEMENT:  Patient reports that she  was very sore in the pectoral area after the last visit.  Reports that she feels like a little cramping  PAIN:  Are you having pain? Yes: NPRS scale: 7/10 Pain location: right shoulder, HA Pain description: dull ache at rest, sharp with motions Aggravating factors: quick motions, movements pain up to 10/10 Relieving factors: ice, rest, Tylenol at best 3/10  PRECAUTIONS: Shoulder Protocol in the chart  RED FLAGS: None   WEIGHT BEARING RESTRICTIONS: No  FALLS:  Has patient fallen in last 6 months? Yes. Number of falls 1  LIVING ENVIRONMENT: Lives with: lives alone Lives in: House/apartment Stairs: No Has following equipment at  home: None  OCCUPATION: retired  PLOF: Independent  PATIENT GOALS:dress without difficulty, do hair, have good ROM and less pain  NEXT MD VISIT:   OBJECTIVE:   DIAGNOSTIC FINDINGS:  See above  PATIENT SURVEYS:  FOTO 21  COGNITION: Overall cognitive status: Within functional limits for tasks assessed     SENSATION: WFL  POSTURE: Fwd head, rounded shoulders, elevated and guarded shoulder  UPPER EXTREMITY ROM:   Active ROM Right PROM eval Right PROM Supine 01/05/23 PROM  01/26/23 PROM 02/09/23 AROM sitting 02/14/23 AROM 02/21/23 AROM  03/02/23 AROM  03/22/23 AAROM 03/28/23 AROM Standing 04/13/23 AROM Standing 05/02/23 AROM  Standing 05/11/23 AROM  Standing 05/25/23 AROM Standing 06/01/23  Shoulder flexion 35  118  123 90 96 95 100 111 110 112 120 125  129  Shoulder extension 5                Shoulder abduction 20  90 92  73 80 83 90 93 90 92 98 100 102   Shoulder adduction                 Shoulder internal rotation 15             40 40  Shoulder external rotation 20  45 30  41  45 50 53 56 56 60 66 67  Elbow flexion 120                Elbow extension 5                Wrist flexion                 Wrist extension                 Wrist ulnar deviation                 Wrist radial deviation                 Wrist pronation                 Wrist supination                 (Blank rows = not tested)  UPPER EXTREMITY MMT:  No tested due to recent surgery  MMT Right eval Left eval  Shoulder flexion    Shoulder extension    Shoulder abduction    Shoulder adduction    Shoulder internal rotation    Shoulder external rotation    Middle trapezius    Lower trapezius    Elbow flexion    Elbow extension    Wrist flexion    Wrist extension    Wrist ulnar deviation    Wrist radial deviation    Wrist pronation    Wrist supination  Grip strength (lbs)    (Blank rows = not tested)  PALPATION:  Very tight and tender in the pectoral, upper trap, the entire right  upper arm   TODAY'S TREATMENT:                                                                                                                                         DATE:  06/29/23 UBE level 4 x 4 minutes Doorway stretch 3 ways Supine Passive stretch all motions 3# punches, flexion, serratus and isometric circles STM to the right upper arm, right trap and pectoral area Vaso medium pressure 34 degrees  06/27/23 UBE level 4 x 5 minutes Supine 2# and 3# serratus, isometric circles, flexion, ER/IR punches STM to the right biceps, deltoid, pectoral area, very tight and tender in the pectorals Passive stretch of the right shoulder all motions except IR, some prolonged star gazer stretch Vaso medium pressure 34 degrees  06/22/23 UBE level 4 x 6 minutes 5# chest press 2# wate bar chest press, flexion to shoulder and behind back PROM of the right shoulder all motions STM to the right deltoid and bicep area Vaso right shoulder 34 degrees medium pressure  06/13/23 UBE level 4 x 6 minutes Wall slides Stick exercises with some passive stretch Supine 2# and 3# exercises to increase strength and function Supine passive stretch of the right shoulder all motions to her tolerance some stretch of the pectoral, and cross body stretch with star gazer stretch STM to the right upper arm and the right upper trap, neck and rhomboid, a lot of trigger points in the rhomboid and upper trap that referred pain to the head and down the right arm Vaso medium pressure 34 degrees  06/08/23 UBE level 4 x 6 minutes chest press 5# Lats 15# Wall slides Supine PROM/joint mobs STM to the neck upper trap, rhomboid and upper arm Vaso medium pressure 35 degrees  06/06/23 UBE level 4 x 6 minutes PROM all motions in supine, included cross body stretch and star gazer STM to the right upper arm Vaso medium pressure  06/01/23 UBE LEvel 4 x 6 minutes Wall slides, circles Supine 3# wate bar chest press and then  flexion 3# isometric circles 3# serratus punch Passive stretch right shoulder all motions ROM measured as above Vaso medium pressure 34 degrees  05/30/23 UBE level 3 x 6 minutes Red tband ER/IR Isometrics all shoulder motions Supine punches and flexion STM to the neck, upper trap, rhomboid and the right upper arm Passive stretch to the right shoulder  Vaso Medium pressure 35 degrees  05/25/23 Nustep level 4 push and pull and then cross body push and pull 3# wate bar extension Body blade 20 seconds all motions 2x20 seconds Passive stretch all motions STM to the right upper arm Vaso medium pressure 34 degrees  PATIENT EDUCATION: Education details: poc/hep Person educated: Patient Education method: Programmer, multimedia,  Demonstration, Tactile cues, Verbal cues, and Handouts Education comprehension: verbalized understanding  HOME EXERCISE PROGRAM: Access Code: [email or web address] URL: [email or web address] Date: 12/20/2022 Prepared by: Stacie Glaze  Exercises - Seated Shoulder Flexion Towel Slide at Table Top  - 2 x daily - 7 x weekly - 2 sets - 10 reps - 3 hold - Seated Shoulder External Rotation PROM on Table  - 2 x daily - 7 x weekly - 2 sets - 10 reps - 3 hold - Seated Elbow Extension and Shoulder External Rotation AAROM at Table with Towel  - 2 x daily - 7 x weekly - 2 sets - 10 reps - 3 hold - Circular Shoulder Pendulum with Table Support  - 2 x daily - 7 x weekly - 2 sets - 10 reps - 3 hold  ASSESSMENT:  CLINICAL IMPRESSION: Doing better with ROM and functional reach, less compensation, still with tingling down the arm.  The pectoral area is very sore and tender today.  Allowed PROM today without as much pain  OBJECTIVE IMPAIRMENTS: cardiopulmonary status limiting activity, decreased activity tolerance, decreased endurance, decreased ROM, decreased strength, increased edema, increased muscle spasms, impaired flexibility, impaired UE functional use, improper body  mechanics, postural dysfunction, and pain .  REHAB POTENTIAL: Good  CLINICAL DECISION MAKING: Evolving/moderate complexity  EVALUATION COMPLEXITY: Low   GOALS: Goals reviewed with patient? Yes  SHORT TERM GOALS: Target date: 01/01/23  Independent with initial HEP Goal status: 12/27/22 MET  LONG TERM GOALS: Target date: 03/22/23  Decrease pain 50% Goal status: 03/22/23 progressing  2.  Dress without difficulty Goal status: progressing 06/22/23  3.  Do hair without difficulty Goal status: able to reach the top of head at times progressing 06/22/23  4.  Increase AROM right shoulder flexion to 130 degrees Goal status: 06/01/23 progressing 120 degrees  5.  Increase right shoulder ER to 60 degrees Goal status: progressing 06/01/23  6.  Return to water aerobics and or gym activity Goal status:progressing met 06/01/23  PLAN:  PT FREQUENCY: 1-2x/week  PT DURATION: 12 weeks  PLANNED INTERVENTIONS: Therapeutic exercises, Therapeutic activity, Neuromuscular re-education, Balance training, Gait training, Patient/Family education, Self Care, Joint mobilization, Dry Needling, Electrical stimulation, Cryotherapy, Vasopneumatic device, and Manual therapy  PLAN FOR NEXT SESSION:   focus more on ROM and decrease the weights as she is so active Jearld Lesch, PT 06/29/2023, 8:01 AM [facility] at [facility] [address] [facility]. [place], [place], [address] Phone: [phone]   Fax:  [phone]

## 2023-07-03 ENCOUNTER — Encounter: Payer: Self-pay | Admitting: Physical Therapy

## 2023-07-03 ENCOUNTER — Ambulatory Visit: Payer: Medicare Other | Attending: Family | Admitting: Physical Therapy

## 2023-07-03 DIAGNOSIS — M542 Cervicalgia: Secondary | ICD-10-CM | POA: Diagnosis not present

## 2023-07-03 DIAGNOSIS — M79641 Pain in right hand: Secondary | ICD-10-CM | POA: Insufficient documentation

## 2023-07-03 DIAGNOSIS — G8929 Other chronic pain: Secondary | ICD-10-CM | POA: Insufficient documentation

## 2023-07-03 DIAGNOSIS — M25642 Stiffness of left hand, not elsewhere classified: Secondary | ICD-10-CM | POA: Insufficient documentation

## 2023-07-03 DIAGNOSIS — M25611 Stiffness of right shoulder, not elsewhere classified: Secondary | ICD-10-CM | POA: Diagnosis not present

## 2023-07-03 DIAGNOSIS — R278 Other lack of coordination: Secondary | ICD-10-CM | POA: Diagnosis not present

## 2023-07-03 DIAGNOSIS — M25511 Pain in right shoulder: Secondary | ICD-10-CM | POA: Insufficient documentation

## 2023-07-03 DIAGNOSIS — R600 Localized edema: Secondary | ICD-10-CM | POA: Diagnosis not present

## 2023-07-03 DIAGNOSIS — R519 Headache, unspecified: Secondary | ICD-10-CM | POA: Diagnosis not present

## 2023-07-03 DIAGNOSIS — M79642 Pain in left hand: Secondary | ICD-10-CM | POA: Diagnosis not present

## 2023-07-03 DIAGNOSIS — M6281 Muscle weakness (generalized): Secondary | ICD-10-CM | POA: Insufficient documentation

## 2023-07-03 DIAGNOSIS — M25641 Stiffness of right hand, not elsewhere classified: Secondary | ICD-10-CM | POA: Insufficient documentation

## 2023-07-03 NOTE — Therapy (Signed)
 OUTPATIENT PHYSICAL THERAPY SHOULDER    Patient Name: Veronica Galvan MRN: [id number] DOB:1955-07-11, 68 y.o., female Today's Date: 07/03/2023  END OF SESSION:  PT End of Session - 07/03/23 1528     Visit Number 54    Date for PT Re-Evaluation 07/04/23    Authorization Type [employer]    PT Start Time 1528    PT Stop Time 1630    PT Time Calculation (min) 62 min    Activity Tolerance Patient tolerated treatment well    Behavior During Therapy WFL for tasks assessed/performed             Past Medical History:  Diagnosis Date   Allergy    Anxiety    Arthritis    hands   Cataract    Diabetes mellitus without complication (HCC)    type 2   Family history of adverse reaction to anesthesia    son has malignant hyperthermia, daughter does not daughter recently had c section without problems   GERD (gastroesophageal reflux disease)    Headache    sinus   Hyperlipidemia    Hypertension    PONV (postoperative nausea and vomiting)    nausea only   Ulcer, stomach peptic yrs ago   Past Surgical History:  Procedure Laterality Date   APPENDECTOMY     both hells bone spur repair     both heels with metal clips   both shoulder rotator cuff repair     CESAREAN SECTION     x 1   COLONOSCOPY WITH PROPOFOL N/A 09/07/2016   Procedure: COLONOSCOPY WITH PROPOFOL;  Surgeon: Charolett Bumpers, MD;  Location: [facility];  Service: Endoscopy;  Laterality: N/A;   colonscopy  06/2011   polyps   EYE SURGERY     FRACTURE SURGERY     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/10/2022   Procedure: REVERSE SHOULDER ARTHROPLASTY;  Surgeon: Francena Hanly, MD;  Location: [facility];  Service: Orthopedics;  Laterality: Right;  Please follow in room 6 if able   TUBAL LIGATION     VESICO-VAGINAL FISTULA REPAIR     Patient Active Problem List   Diagnosis Date Noted   Gastroesophageal reflux disease 11/16/2021   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 08/18/2021    Dermatofibroma 08/18/2021   History of malignant neoplasm of skin 08/18/2021   Lentigo 08/18/2021   Melanocytic nevi of trunk 08/18/2021   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   Rosacea 08/18/2021   Actinic keratosis 08/18/2021   Sensorineural hearing loss (SNHL) of left ear with unrestricted hearing of right ear 07/26/2021   Tinnitus of left ear 07/26/2021   Acute recurrent maxillary sinusitis 06/22/2021   Primary hypertension 01/12/2021   Hyperlipidemia associated with type 2 diabetes mellitus (HCC) 01/12/2021   Arthritis 01/12/2021   Type II diabetes mellitus (HCC) 11/25/2019   Ingrown toenail 09/05/2018   Neck pain 01/29/2018   Tick bite 10/24/2017    PCP: Dayton Scrape, [name]  REFERRING PROVIDER: Rennis Chris, MD  REFERRING DIAG: s/p right reverse TSA  THERAPY DIAG:  Muscle weakness (generalized)  Stiffness of right shoulder, not elsewhere classified  Stiffness of right hand, not elsewhere classified  Acute pain of right shoulder  Cervicalgia  Chronic intractable headache, unspecified headache type  Rationale for Evaluation and Treatment: Rehabilitation  ONSET DATE: 11/05/22  SUBJECTIVE:  SUBJECTIVE STATEMENT:  Patient reports that she  did a lot of cooking and lifted some heavy pots, she is sore and reports feeling stiff in the right anterior shoulder and arm PAIN:  Are you having pain? Yes: NPRS scale: 6/10 Pain location: right shoulder, HA Pain description: dull ache at rest, sharp with motions Aggravating factors: quick motions, movements pain up to 10/10 Relieving factors: ice, rest, Tylenol at best 3/10  PRECAUTIONS: Shoulder Protocol in the chart  RED FLAGS: None   WEIGHT BEARING RESTRICTIONS: No  FALLS:  Has patient fallen in last 6 months? Yes. Number of falls  1  LIVING ENVIRONMENT: Lives with: lives alone Lives in: House/apartment Stairs: No Has following equipment at home: None  OCCUPATION: retired  PLOF: Independent  PATIENT GOALS:dress without difficulty, do hair, have good ROM and less pain  NEXT MD VISIT:   OBJECTIVE:   DIAGNOSTIC FINDINGS:  See above  PATIENT SURVEYS:  FOTO 21  COGNITION: Overall cognitive status: Within functional limits for tasks assessed     SENSATION: WFL  POSTURE: Fwd head, rounded shoulders, elevated and guarded shoulder  UPPER EXTREMITY ROM:   Active ROM Right PROM eval Right PROM Supine 01/05/23 PROM  01/26/23 PROM 02/09/23 AROM sitting 02/14/23 AROM 02/21/23 AROM  03/02/23 AROM  03/22/23 AAROM 03/28/23 AROM Standing 04/13/23 AROM Standing 05/02/23 AROM  Standing 05/11/23 AROM  Standing 05/25/23 AROM Standing 06/01/23  Shoulder flexion 35  118  123 90 96 95 100 111 110 112 120 125  129  Shoulder extension 5                Shoulder abduction 20  90 92  73 80 83 90 93 90 92 98 100 102   Shoulder adduction                 Shoulder internal rotation 15             40 40  Shoulder external rotation 20  45 30  41  45 50 53 56 56 60 66 67  Elbow flexion 120                Elbow extension 5                Wrist flexion                 Wrist extension                 Wrist ulnar deviation                 Wrist radial deviation                 Wrist pronation                 Wrist supination                 (Blank rows = not tested)  UPPER EXTREMITY MMT:  No tested due to recent surgery  MMT Right eval Left eval  Shoulder flexion    Shoulder extension    Shoulder abduction    Shoulder adduction    Shoulder internal rotation    Shoulder external rotation    Middle trapezius    Lower trapezius    Elbow flexion    Elbow extension    Wrist flexion    Wrist extension    Wrist ulnar deviation    Wrist radial deviation    Wrist pronation    Wrist  supination    Grip strength  (lbs)    (Blank rows = not tested)  PALPATION:  Very tight and tender in the pectoral, upper trap, the entire right upper arm   TODAY'S TREATMENT:                                                                                                                                         DATE:  07/03/23 Nustep level 5 x 5 minutes Doorway stretch Star gazer stretch Cross body stretch STM to the right bicep, deltoid, pectoral and upper trap and rhomboid, PROM all right shoulder motions Vaso medium pressure 35 degrees  06/29/23 UBE level 4 x 4 minutes Doorway stretch 3 ways Supine Passive stretch all motions 3# punches, flexion, serratus and isometric circles STM to the right upper arm, right trap and pectoral area Vaso medium pressure 34 degrees  06/27/23 UBE level 4 x 5 minutes Supine 2# and 3# serratus, isometric circles, flexion, ER/IR punches STM to the right biceps, deltoid, pectoral area, very tight and tender in the pectorals Passive stretch of the right shoulder all motions except IR, some prolonged star gazer stretch Vaso medium pressure 34 degrees  06/22/23 UBE level 4 x 6 minutes 5# chest press 2# wate bar chest press, flexion to shoulder and behind back PROM of the right shoulder all motions STM to the right deltoid and bicep area Vaso right shoulder 34 degrees medium pressure  06/13/23 UBE level 4 x 6 minutes Wall slides Stick exercises with some passive stretch Supine 2# and 3# exercises to increase strength and function Supine passive stretch of the right shoulder all motions to her tolerance some stretch of the pectoral, and cross body stretch with star gazer stretch STM to the right upper arm and the right upper trap, neck and rhomboid, a lot of trigger points in the rhomboid and upper trap that referred pain to the head and down the right arm Vaso medium pressure 34 degrees  06/08/23 UBE level 4 x 6 minutes chest press 5# Lats 15# Wall slides Supine PROM/joint  mobs STM to the neck upper trap, rhomboid and upper arm Vaso medium pressure 35 degrees  06/06/23 UBE level 4 x 6 minutes PROM all motions in supine, included cross body stretch and star gazer STM to the right upper arm Vaso medium pressure  06/01/23 UBE LEvel 4 x 6 minutes Wall slides, circles Supine 3# wate bar chest press and then flexion 3# isometric circles 3# serratus punch Passive stretch right shoulder all motions ROM measured as above Vaso medium pressure 34 degrees  05/30/23 UBE level 3 x 6 minutes Red tband ER/IR Isometrics all shoulder motions Supine punches and flexion STM to the neck, upper trap, rhomboid and the right upper arm Passive stretch to the right shoulder  Vaso Medium pressure 35 degrees  05/25/23 Nustep level 4 push and pull and then cross body  push and pull 3# wate bar extension Body blade 20 seconds all motions 2x20 seconds Passive stretch all motions STM to the right upper arm Vaso medium pressure 34 degrees  PATIENT EDUCATION: Education details: poc/hep Person educated: Patient Education method: Programmer, multimedia, Facilities manager, Actor cues, Verbal cues, and Handouts Education comprehension: verbalized understanding  HOME EXERCISE PROGRAM: Access Code: [email or web address] URL: [email or web address] Date: 12/20/2022 Prepared by: Stacie Glaze  Exercises - Seated Shoulder Flexion Towel Slide at Table Top  - 2 x daily - 7 x weekly - 2 sets - 10 reps - 3 hold - Seated Shoulder External Rotation PROM on Table  - 2 x daily - 7 x weekly - 2 sets - 10 reps - 3 hold - Seated Elbow Extension and Shoulder External Rotation AAROM at Table with Towel  - 2 x daily - 7 x weekly - 2 sets - 10 reps - 3 hold - Circular Shoulder Pendulum with Table Support  - 2 x daily - 7 x weekly - 2 sets - 10 reps - 3 hold  ASSESSMENT:  CLINICAL IMPRESSION: Patient still with some c/o tingling in the right fingers I may try to add some neural tension stretches and  see if this helps.  She is still very tight in the pec, seems to be very tender in the biceps today, allowed better PROM and I still feel the ROM is improving  OBJECTIVE IMPAIRMENTS: cardiopulmonary status limiting activity, decreased activity tolerance, decreased endurance, decreased ROM, decreased strength, increased edema, increased muscle spasms, impaired flexibility, impaired UE functional use, improper body mechanics, postural dysfunction, and pain .  REHAB POTENTIAL: Good  CLINICAL DECISION MAKING: Evolving/moderate complexity  EVALUATION COMPLEXITY: Low   GOALS: Goals reviewed with patient? Yes  SHORT TERM GOALS: Target date: 01/01/23  Independent with initial HEP Goal status: 12/27/22 MET  LONG TERM GOALS: Target date: 03/22/23  Decrease pain 50% Goal status: 03/22/23 progressing  2.  Dress without difficulty Goal status: progressing 06/22/23  3.  Do hair without difficulty Goal status: able to reach the top of head at times progressing 06/22/23  4.  Increase AROM right shoulder flexion to 130 degrees Goal status: 06/01/23 progressing 120 degrees  5.  Increase right shoulder ER to 60 degrees Goal status: progressing 06/01/23  6.  Return to water aerobics and or gym activity Goal status:progressing met 06/01/23  PLAN:  PT FREQUENCY: 1-2x/week  PT DURATION: 12 weeks  PLANNED INTERVENTIONS: Therapeutic exercises, Therapeutic activity, Neuromuscular re-education, Balance training, Gait training, Patient/Family education, Self Care, Joint mobilization, Dry Needling, Electrical stimulation, Cryotherapy, Vasopneumatic device, and Manual therapy  PLAN FOR NEXT SESSION:   focus more on ROM and decrease the weights as she is so active Jearld Lesch, PT 07/03/2023, 5:40 PM [facility] at [facility] [address] [facility]. [place], [place], [address] Phone: [phone]   Fax:  [phone]

## 2023-07-04 ENCOUNTER — Ambulatory Visit: Payer: Medicare Other | Admitting: Occupational Therapy

## 2023-07-06 ENCOUNTER — Ambulatory Visit: Payer: Medicare Other | Admitting: Occupational Therapy

## 2023-07-06 ENCOUNTER — Ambulatory Visit: Payer: Medicare Other | Admitting: Physical Therapy

## 2023-07-06 ENCOUNTER — Encounter: Payer: Self-pay | Admitting: Physical Therapy

## 2023-07-06 DIAGNOSIS — R519 Headache, unspecified: Secondary | ICD-10-CM | POA: Diagnosis not present

## 2023-07-06 DIAGNOSIS — M542 Cervicalgia: Secondary | ICD-10-CM | POA: Diagnosis not present

## 2023-07-06 DIAGNOSIS — M25641 Stiffness of right hand, not elsewhere classified: Secondary | ICD-10-CM | POA: Diagnosis not present

## 2023-07-06 DIAGNOSIS — R600 Localized edema: Secondary | ICD-10-CM | POA: Diagnosis not present

## 2023-07-06 DIAGNOSIS — M6281 Muscle weakness (generalized): Secondary | ICD-10-CM

## 2023-07-06 DIAGNOSIS — M25611 Stiffness of right shoulder, not elsewhere classified: Secondary | ICD-10-CM | POA: Diagnosis not present

## 2023-07-06 DIAGNOSIS — R278 Other lack of coordination: Secondary | ICD-10-CM

## 2023-07-06 DIAGNOSIS — M79642 Pain in left hand: Secondary | ICD-10-CM

## 2023-07-06 DIAGNOSIS — M25511 Pain in right shoulder: Secondary | ICD-10-CM

## 2023-07-06 DIAGNOSIS — M79641 Pain in right hand: Secondary | ICD-10-CM | POA: Diagnosis not present

## 2023-07-06 DIAGNOSIS — M25642 Stiffness of left hand, not elsewhere classified: Secondary | ICD-10-CM

## 2023-07-06 DIAGNOSIS — G8929 Other chronic pain: Secondary | ICD-10-CM | POA: Diagnosis not present

## 2023-07-06 NOTE — Therapy (Signed)
 OUTPATIENT OCCUPATIONAL THERAPY ORTHO Treatment  Patient Name: Veronica Galvan MRN: [id number] DOB:30-Sep-1955, 68 y.o., female Today's Date: 07/06/2023  PCP: Olive Bass [name] REFERRING PROVIDER: Olive Bass [name]  END OF SESSION:  OT End of Session - 07/06/23 0859     Visit Number 9    Number of Visits 25    Date for OT Re-Evaluation 08/16/23    Authorization Type [employer] M[employer]    Progress Note Due on Visit 10    OT Start Time 0848    OT Stop Time 0930    OT Time Calculation (min) 42 min                 Past Medical History:  Diagnosis Date   Allergy    Anxiety    Arthritis    hands   Cataract    Diabetes mellitus without complication (HCC)    type 2   Family history of adverse reaction to anesthesia    son has malignant hyperthermia, daughter does not daughter recently had c section without problems   GERD (gastroesophageal reflux disease)    Headache    sinus   Hyperlipidemia    Hypertension    PONV (postoperative nausea and vomiting)    nausea only   Ulcer, stomach peptic yrs ago   Past Surgical History:  Procedure Laterality Date   APPENDECTOMY     both hells bone spur repair     both heels with metal clips   both shoulder rotator cuff repair     CESAREAN SECTION     x 1   COLONOSCOPY WITH PROPOFOL N/A 09/07/2016   Procedure: COLONOSCOPY WITH PROPOFOL;  Surgeon: Charolett Bumpers, MD;  Location: [facility];  Service: Endoscopy;  Laterality: N/A;   colonscopy  06/2011   polyps   EYE SURGERY     FRACTURE SURGERY     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/10/2022   Procedure: REVERSE SHOULDER ARTHROPLASTY;  Surgeon: Francena Hanly, MD;  Location: [facility];  Service: Orthopedics;  Laterality: Right;  Please follow in room 6 if able   TUBAL LIGATION     VESICO-VAGINAL FISTULA REPAIR     Patient Active Problem List   Diagnosis Date Noted   Gastroesophageal reflux disease 11/16/2021   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 08/18/2021   Dermatofibroma 08/18/2021   History of malignant neoplasm of skin 08/18/2021   Lentigo 08/18/2021   Melanocytic nevi of trunk 08/18/2021   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   Rosacea 08/18/2021   Actinic keratosis 08/18/2021   Sensorineural hearing loss (SNHL) of left ear with unrestricted hearing of right ear 07/26/2021   Tinnitus of left ear 07/26/2021   Acute recurrent maxillary sinusitis 06/22/2021   Primary hypertension 01/12/2021   Hyperlipidemia associated with type 2 diabetes mellitus (HCC) 01/12/2021   Arthritis 01/12/2021   Type II diabetes mellitus (HCC) 11/25/2019   Ingrown toenail 09/05/2018   Neck pain 01/29/2018   Tick bite 10/24/2017    ONSET DATE: 05/19/23  REFERRING DIAG:  Diagnosis  M79.641,M79.642 (ICD-10-CM) - Bilateral hand pain    THERAPY DIAG:  Muscle weakness (generalized)  Stiffness of right shoulder, not elsewhere classified  Stiffness of right hand, not elsewhere classified  Acute pain of right shoulder  Pain in right hand  Stiffness of left hand, not elsewhere classified  Pain in left hand  Other lack of coordination  Rationale for Evaluation and Treatment: Rehabilitation  SUBJECTIVE:   SUBJECTIVE STATEMENT: Pt  reports her hands felt looser after last session Pt accompanied by: self  PERTINENT HISTORY: 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 Yes: NPRS scale: 3/10 Pain location: bilateral hands Pain description: aching, stiff Aggravating factors: gripping  Relieving factors: heat, meds Pt also has shoulder pain 4-5/10 which OT will not address as PT is working with pt. to address.  FALLS: Has patient fallen in last 6 months? No  LIVING  ENVIRONMENT: Lives with: lives alone Lives in: House/apartment Stairs: yes   PLOF: Independent  PATIENT GOALS: improve functional use of hands and learn adapted strategies for ADLs/IADLs.  NEXT MD VISIT: unknown  OBJECTIVE:  Note: Objective measures were completed at Evaluation unless otherwise noted.  HAND DOMINANCE: Right  ADLs: Overall ADLs: unable to grip credit card to remove from ATM Transfers/ambulation related to ADLs: mod I Eating: drops silverware Grooming: drops toothbrush, uses electric toothbrush, holding comb Upper body dressing: adjusting bra Lower body dressing: difficulty pulling up pants  Toileting: hygeine was difficult initally Bathing: mod I, difficult with shaving Tub shower transfers: walk in shower    FUNCTIONAL OUTCOME MEASURES: Quick Dash: 05/30/23- 75%% disability  UPPER EXTREMITY ROM:   RUE wrist flexion/ extension: 70/ 50, LUE wrist flexion/ extension: 70/ 45 Pt demonstrates grossly 50% composite flexion for right and 555 with left.   Active ROM Right eval Left eval  Thumb MCP (0-60) 45 55  Thumb IP (0-80) 25 10  Thumb Radial abd/add (0-55)     Thumb Palmar abd/add (0-45)     Thumb Opposition to Small Finger yes yes   Index MCP (0-90)     Index PIP (0-100)     Index DIP (0-70)      Long MCP (0-90)      Long PIP (0-100)      Long DIP (0-70)      Ring MCP (0-90)      Ring PIP (0-100)      Ring DIP (0-70)      Little MCP (0-90)      Little PIP (0-100)      Little DIP (0-70)      (Blank rows = not tested)   HAND FUNCTION: Grip strength: Right: 8 lbs; Left: 4 lbs,   SENSATION: Light touch: Impaired index finger LUE  EDEMA: Pt with bony defomities at PIP joints of all digits which pt reports are bone spurs, Pt also has bony deformity at DIP for right thumb, index and 5th digit and LUE small and index fingers  COGNITION: Overall cognitive status: Within functional limits for tasks assessed   OBSERVATIONS: Pleasant female  desires to increase functional use of bilateral UE's   TREATMENT DATE:07/06/23 Pt was shown buttonhook and she practiced using it with good success. Pt plans to order one. Pt was also shown the Pen again for writing acitivities. pt returned demonstration of use. It places RUE in a better position for writing.Pt Paraffin to right hand x 10 mins while pt perfromed A/ROM  for LUE: blocking exercises followed by P/ROM composite flexion to digits individually and compositely, A/ROM thumb  flexion/ extension.  Paraffin to LUE x 10 mins  while RUE A/ROM blocking exercises followed by P/ROM composite flexion to digits individually and compositely, A/ROM .  Yellow putty was issued for  sustained grip bilateral UE's and tip pinch for LUE, pt returned demonstration grossly 20 reps each min v.c  Wash cloth scrunches and flattening 10 reps min v.c   06/29/23-Paraffin to left hand x 10 mins while pt perfromed A/ROM  for RUE: blocking exercises for each digit, followed by P/ROM composite flexion to digits individually and compositely. Paraffin to RUE x 10 mins  while LUE A/ROM blocking exercises for each digit,  followed by P/ROM composite flexion to digits individually and compositely.  Yellow putty was issued for sustained grip bilateral UE's and tip pinch for LUE, pt returned demonstration grossly 20 reps each min v.c  Wash cloth scrunches and flattening 5-10 reps each hand, min v.c    06/27/23- Paraffin to left hand x 10 mins while pt perfromed A/ROM  for RUE: blocking exercises for each digit, thumb flexion extension, followed by P/ROM composite flexion to digits individually and compositely. Pt demonstrates improved composite finger flexion end of session. Paraffin to RUE x 10 mins  while LUE A/ROM blocking exercises for each digit, thumb flexion extension, followed by P/ROM composite flexion to digits individually and compositely. Pt will improved composite finger flexion end of session.  06/22/23- Pt brought in her  splints, the splint for left hand is fitting well. Pt can benefit from a different splint for RUE that places index finger in greater extension. Therapist fabricated an index finger splint to position PIP in extension and to address DIP deformity. Pt reports splint fits well A/ROM PIP blocking to digits of left and right hands, thumb flexion/ extension, opposition, min v.c and min facilitation Pt brought in her AE bottle openers and pt practiced opening plastic bottle. Pt was issued additional foam grips for pens.    06/20/23 Today's therapy session was focused on fabrication of custom circumferential finger splints for bilateral index fingers to improve DIP positioning to minimize risk for additional deformity. 1 splint was fabicated for right index and 2 for left. Pt's second index finger splint also promotes finger extension at PIP joint.  Pt was instructed in splint wear, care and precautions. Pt. reports AE is helping at home. 06/14/23 paraffin x 10 mins to bilateral UE's Ice pack to Left shoulder x 10 mins for pain relief, no adverse reactions Reviewed PIP, DIP blocking exercises, finger thumb opposition and thumb flexion for bilateral UE's. Discussed recommendations for adapted strategies for ADLs including use of food processor to shred chicken. Pt reports using food processory for cutting vegetables and it worked well. Pt reports she purchased several items we discussed last visit and overall they are working well. Therapist began assessing splinting needs, oval 8's were tried but did not provide correct positioning, pt will benefit from custom finger splints in the future to improve DIP positioning.    06/06/22- Paraffin x 10 mins to bilateral UE's for pain and stiffness, no adverse reactions Reviewed A/ROM HEP issued last visit 5-10 reps each bilateral UE's. Discussions regarding activity modification and potential AE including opening bottles/ jars, and use of food processor for chopping  vegatables instead of chopping with a knife. Pt was shown some options on the internet. Discussed importance of warming up before activity with paraffin or gentle A/ROM   05/30/23- Paraffin x 10 mins to bilateral UE's for pain and stiffness, then pt was instucted in A/ROM HEP. See pt. instructions. Therapist began discussing activity modifications and AE. Pt was provided with a foam grip for increased ease with handwriting.  Pt tried using the sock aide however  it was more challenging than donning the sock the traditional way.   05/24/23 eval only                                                                                                                                PATIENT EDUCATION: Education details:  yellow putty HEP- see pt instructions Person educated: Patient Education method: Explanation, demonstration, v.c , handout Education comprehension: verbalized understanding, returned demonstration  HOME EXERCISE PROGRAM: AROM  GOALS: Goals reviewed with patient? Yes  SHORT TERM GOALS: Target date: 06/24/23  I with HEP  Goal status:  met 06/07/23  2.  I with positioning/ splinting prn to minimize pain and defomity   Goal status:ongoing inital splints, issued.  3.  Pt will increase bilateral grip strength by 5 lbs for increased UE functional use. Baseline: 05/30/23- RUE 8 lbs, LUE 4 lbs Goal status:  ongoing, 07/05/23    LONG TERM GOALS: Target date: 08/16/23  I with updated HEP  Goal status: INITIAL  2.  Pt will improve Quick Dash score to 70% disability Baseline: 75% (05/30/23) Goal status: INITIAL  3.  Pt will demonstrate at least 65% composte flexion for LUE for increased functional use.  Goal status: INITIAL  4.  I with adapted strategies/ adapted equipment to minimize pain and to increase pt I with ADLs/IADLs  Goal status: INITIAL  5.  Pt will demonstrate at least 65% composite flexion for RUE for increased functional use.  Goal status:  INITIAL    ASSESSMENT:  CLINICAL IMPRESSION: Patient is progressing towards goals. Pt is demonstates understanding of AE for writing and use of buttonhook.PERFORMANCE DEFICITS: in functional skills including ADLs, IADLs, coordination, sensation, edema, ROM, strength, pain, flexibility, Fine motor control, Gross motor control, endurance, decreased knowledge of precautions, decreased knowledge of use of DME, and UE functional use,, and psychosocial skills including coping strategies, environmental adaptation, habits, interpersonal interactions, and routines and behaviors.   IMPAIRMENTS: are limiting patient from ADLs, IADLs, rest and sleep, education, play, leisure, and social participation.   COMORBIDITIES: may have co-morbidities  that affects occupational performance. Patient will benefit from skilled OT to address above impairments and improve overall function.  MODIFICATION OR ASSISTANCE TO COMPLETE EVALUATION: No modification of tasks or assist necessary to complete an evaluation.  OT OCCUPATIONAL PROFILE AND HISTORY: Detailed assessment: Review of records and additional review of physical, cognitive, psychosocial history related to current functional performance.  CLINICAL DECISION MAKING: LOW - limited treatment options, no task modification necessary  REHAB POTENTIAL: Good  EVALUATION COMPLEXITY: Low      PLAN:  OT FREQUENCY: 2x/week plus eval  OT DURATION: 12 weeks  PLANNED INTERVENTIONS: 97168 OT Re-evaluation, 97535 self care/ADL training, 82956 therapeutic exercise, 97530 therapeutic activity, 97112 neuromuscular re-education, 97140 manual therapy, 97113 aquatic therapy, 97035 ultrasound, 97018 paraffin, 21308 fluidotherapy, 97010 moist heat, 97010 cryotherapy, 97034 contrast bath, 97760 Orthotics management and training, 65784 Splinting (initial encounter), [id number] Subsequent splinting/medication, passive  range of motion, energy conservation, coping strategies training,  patient/family education, and DME and/or AE instructions  RECOMMENDED OTHER SERVICES: none  CONSULTED AND AGREED WITH PLAN OF CARE: Patient  PLAN FOR NEXT SESSION: check on putty HEP, continue with AE   Jeily Guthridge, OT 07/06/2023, 9:59 AM

## 2023-07-06 NOTE — Therapy (Signed)
 OUTPATIENT PHYSICAL THERAPY SHOULDER    Patient Name: Veronica Galvan MRN: [id number] DOB:09/26/1955, 68 y.o., female Today's Date: 07/06/2023  END OF SESSION:  PT End of Session - 07/06/23 1422     Visit Number 55    Date for PT Re-Evaluation 08/06/23    Authorization Type [employer] [place]    PT Start Time 1358    PT Stop Time 1455    PT Time Calculation (min) 57 min    Activity Tolerance Patient tolerated treatment well    Behavior During Therapy WFL for tasks assessed/performed             Past Medical History:  Diagnosis Date   Allergy    Anxiety    Arthritis    hands   Cataract    Diabetes mellitus without complication (HCC)    type 2   Family history of adverse reaction to anesthesia    son has malignant hyperthermia, daughter does not daughter recently had c section without problems   GERD (gastroesophageal reflux disease)    Headache    sinus   Hyperlipidemia    Hypertension    PONV (postoperative nausea and vomiting)    nausea only   Ulcer, stomach peptic yrs ago   Past Surgical History:  Procedure Laterality Date   APPENDECTOMY     both hells bone spur repair     both heels with metal clips   both shoulder rotator cuff repair     CESAREAN SECTION     x 1   COLONOSCOPY WITH PROPOFOL N/A 09/07/2016   Procedure: COLONOSCOPY WITH PROPOFOL;  Surgeon: Charolett Bumpers, MD;  Location: [facility];  Service: Endoscopy;  Laterality: N/A;   colonscopy  06/2011   polyps   EYE SURGERY     FRACTURE SURGERY     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/10/2022   Procedure: REVERSE SHOULDER ARTHROPLASTY;  Surgeon: Francena Hanly, MD;  Location: [facility];  Service: Orthopedics;  Laterality: Right;  Please follow in room 6 if able   TUBAL LIGATION     VESICO-VAGINAL FISTULA REPAIR     Patient Active Problem List   Diagnosis Date Noted   Gastroesophageal reflux disease 11/16/2021   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 08/18/2021    Dermatofibroma 08/18/2021   History of malignant neoplasm of skin 08/18/2021   Lentigo 08/18/2021   Melanocytic nevi of trunk 08/18/2021   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   Rosacea 08/18/2021   Actinic keratosis 08/18/2021   Sensorineural hearing loss (SNHL) of left ear with unrestricted hearing of right ear 07/26/2021   Tinnitus of left ear 07/26/2021   Acute recurrent maxillary sinusitis 06/22/2021   Primary hypertension 01/12/2021   Hyperlipidemia associated with type 2 diabetes mellitus (HCC) 01/12/2021   Arthritis 01/12/2021   Type II diabetes mellitus (HCC) 11/25/2019   Ingrown toenail 09/05/2018   Neck pain 01/29/2018   Tick bite 10/24/2017    PCP: Dayton Scrape, [name]  REFERRING PROVIDER: Rennis Chris, MD  REFERRING DIAG: s/p right reverse TSA  THERAPY DIAG:  Muscle weakness (generalized)  Stiffness of right shoulder, not elsewhere classified  Acute pain of right shoulder  Cervicalgia  Localized edema  Rationale for Evaluation and Treatment: Rehabilitation  ONSET DATE: 11/05/22  SUBJECTIVE:  SUBJECTIVE STATEMENT:  Patient continues to report that she feels the ROM is better but that she is really hurting in the right upper arm, she does report that she has backed off of a lot of the extra activities that she was doing to see if this helps and we have decreased the weights here PAIN:  Are you having pain? Yes: NPRS scale: 6/10 Pain location: right shoulder, HA Pain description: dull ache at rest, sharp with motions Aggravating factors: quick motions, movements pain up to 10/10 Relieving factors: ice, rest, Tylenol at best 3/10  PRECAUTIONS: Shoulder Protocol in the chart  RED FLAGS: None   WEIGHT BEARING RESTRICTIONS: No  FALLS:  Has patient fallen in last 6 months? Yes.  Number of falls 1  LIVING ENVIRONMENT: Lives with: lives alone Lives in: House/apartment Stairs: No Has following equipment at home: None  OCCUPATION: retired  PLOF: Independent  PATIENT GOALS:dress without difficulty, do hair, have good ROM and less pain  NEXT MD VISIT:   OBJECTIVE:   DIAGNOSTIC FINDINGS:  See above  PATIENT SURVEYS:  FOTO 21  COGNITION: Overall cognitive status: Within functional limits for tasks assessed     SENSATION: WFL  POSTURE: Fwd head, rounded shoulders, elevated and guarded shoulder  UPPER EXTREMITY ROM:   Active ROM Right PROM eval Right PROM Supine 01/05/23 PROM  01/26/23 PROM 02/09/23 AROM sitting 02/14/23 AROM 02/21/23 AROM  03/02/23 AROM  03/22/23 AAROM 03/28/23 AROM Standing 04/13/23 AROM Standing 05/02/23 AROM  Standing 05/11/23 AROM  Standing 05/25/23 AROM Standing 06/01/23 AROM Standing  07/06/23  Shoulder flexion 35  118  123 90 96 95 100 111 110 112 120 125  129 132  Shoulder extension 5                 Shoulder abduction 20  90 92  73 80 83 90 93 90 92 98 100 102  105  Shoulder adduction                  Shoulder internal rotation 15             40 40   Shoulder external rotation 20  45 30  41  45 50 53 56 56 60 66 67 70  Elbow flexion 120                 Elbow extension 5                 Wrist flexion                  Wrist extension                  Wrist ulnar deviation                  Wrist radial deviation                  Wrist pronation                  Wrist supination                  (Blank rows = not tested)  UPPER EXTREMITY MMT:  No tested due to recent surgery  MMT Right eval Left eval  Shoulder flexion    Shoulder extension    Shoulder abduction    Shoulder adduction    Shoulder internal rotation    Shoulder external rotation    Middle trapezius    Lower  trapezius    Elbow flexion    Elbow extension    Wrist flexion    Wrist extension    Wrist ulnar deviation    Wrist radial deviation     Wrist pronation    Wrist supination    Grip strength (lbs)    (Blank rows = not tested)  PALPATION:  Very tight and tender in the pectoral, upper trap, the entire right upper arm   TODAY'S TREATMENT:                                                                                                                                         DATE:  07/06/23 Push pull with Nustep and then cross body push and pull Passive stretch to the right UE all motions Right UE STM [place] to the right upper arm 100% Supine 3# punches 3# isometric circles 2# ER/IR 2# flexion Vaso medium pressure 34 degrees  07/03/23 Nustep level 5 x 5 minutes Doorway stretch Star gazer stretch Cross body stretch STM to the right bicep, deltoid, pectoral and upper trap and rhomboid, PROM all right shoulder motions Vaso medium pressure 35 degrees  06/29/23 UBE level 4 x 4 minutes Doorway stretch 3 ways Supine Passive stretch all motions 3# punches, flexion, serratus and isometric circles STM to the right upper arm, right trap and pectoral area Vaso medium pressure 34 degrees  06/27/23 UBE level 4 x 5 minutes Supine 2# and 3# serratus, isometric circles, flexion, ER/IR punches STM to the right biceps, deltoid, pectoral area, very tight and tender in the pectorals Passive stretch of the right shoulder all motions except IR, some prolonged star gazer stretch Vaso medium pressure 34 degrees  06/22/23 UBE level 4 x 6 minutes 5# chest press 2# wate bar chest press, flexion to shoulder and behind back PROM of the right shoulder all motions STM to the right deltoid and bicep area Vaso right shoulder 34 degrees medium pressure  06/13/23 UBE level 4 x 6 minutes Wall slides Stick exercises with some passive stretch Supine 2# and 3# exercises to increase strength and function Supine passive stretch of the right shoulder all motions to her tolerance some stretch of the pectoral, and cross body stretch with star gazer  stretch STM to the right upper arm and the right upper trap, neck and rhomboid, a lot of trigger points in the rhomboid and upper trap that referred pain to the head and down the right arm Vaso medium pressure 34 degrees  06/08/23 UBE level 4 x 6 minutes chest press 5# Lats 15# Wall slides Supine PROM/joint mobs STM to the neck upper trap, rhomboid and upper arm Vaso medium pressure 35 degrees  PATIENT EDUCATION: Education details: poc/hep Person educated: Patient Education method: Programmer, multimedia, Facilities manager, Actor cues, Verbal cues, and Handouts Education comprehension: verbalized understanding  HOME EXERCISE PROGRAM: Access Code: [email or web address] URL: [email or web address] Date: 12/20/2022 Prepared by:  Stacie Glaze  Exercises - Seated Shoulder Flexion Towel Slide at Table Top  - 2 x daily - 7 x weekly - 2 sets - 10 reps - 3 hold - Seated Shoulder External Rotation PROM on Table  - 2 x daily - 7 x weekly - 2 sets - 10 reps - 3 hold - Seated Elbow Extension and Shoulder External Rotation AAROM at Table with Towel  - 2 x daily - 7 x weekly - 2 sets - 10 reps - 3 hold - Circular Shoulder Pendulum with Table Support  - 2 x daily - 7 x weekly - 2 sets - 10 reps - 3 hold  ASSESSMENT:  CLINICAL IMPRESSION: Patient still with some c/o tingling in the right fingers I added [place] today and really did some STM of the right upper arm.  She does have tightness here and knots, I agree with her that the ROM is very good for the surgery and the complication of the comminuted fracture, she is frustrated that the pain is worse since she really did a lot of activity in November and December  OBJECTIVE IMPAIRMENTS: cardiopulmonary status limiting activity, decreased activity tolerance, decreased endurance, decreased ROM, decreased strength, increased edema, increased muscle spasms, impaired flexibility, impaired UE functional use, improper body mechanics, postural dysfunction, and pain .   REHAB POTENTIAL: Good  CLINICAL DECISION MAKING: Evolving/moderate complexity  EVALUATION COMPLEXITY: Low   GOALS: Goals reviewed with patient? Yes  SHORT TERM GOALS: Target date: 01/01/23  Independent with initial HEP Goal status: 12/27/22 MET  LONG TERM GOALS: Target date: 03/22/23  Decrease pain 50% Goal status: 03/22/23 progressing  2.  Dress without difficulty Goal status: progressing 06/22/23  3.  Do hair without difficulty Goal status: able to reach the top of head at times progressing 06/22/23  4.  Increase AROM right shoulder flexion to 130 degrees Goal status: 06/01/23 progressing 120 degrees  5.  Increase right shoulder ER to 60 degrees Goal status: met 07/06/23  6.  Return to water aerobics and or gym activity Goal status:progressing met 06/01/23  PLAN:  PT FREQUENCY: 1-2x/week  PT DURATION: 12 weeks  PLANNED INTERVENTIONS: Therapeutic exercises, Therapeutic activity, Neuromuscular re-education, Balance training, Gait training, Patient/Family education, Self Care, Joint mobilization, Dry Needling, Electrical stimulation, Cryotherapy, Vasopneumatic device, and Manual therapy  PLAN FOR NEXT SESSION:   focus more on ROM and decrease the weights as she is so active Jearld Lesch, PT 07/06/2023, 2:22 PM [facility] at [facility] [address] [facility]. [place], [place], [address] Phone: [phone]   Fax:  [phone]

## 2023-07-11 ENCOUNTER — Encounter: Payer: Self-pay | Admitting: Physical Therapy

## 2023-07-11 ENCOUNTER — Ambulatory Visit: Payer: Medicare Other | Admitting: Physical Therapy

## 2023-07-11 ENCOUNTER — Ambulatory Visit: Payer: Medicare Other | Admitting: Occupational Therapy

## 2023-07-11 DIAGNOSIS — R519 Headache, unspecified: Secondary | ICD-10-CM | POA: Diagnosis not present

## 2023-07-11 DIAGNOSIS — R278 Other lack of coordination: Secondary | ICD-10-CM | POA: Diagnosis not present

## 2023-07-11 DIAGNOSIS — M25642 Stiffness of left hand, not elsewhere classified: Secondary | ICD-10-CM | POA: Diagnosis not present

## 2023-07-11 DIAGNOSIS — M542 Cervicalgia: Secondary | ICD-10-CM | POA: Diagnosis not present

## 2023-07-11 DIAGNOSIS — M6281 Muscle weakness (generalized): Secondary | ICD-10-CM

## 2023-07-11 DIAGNOSIS — R600 Localized edema: Secondary | ICD-10-CM | POA: Diagnosis not present

## 2023-07-11 DIAGNOSIS — M79642 Pain in left hand: Secondary | ICD-10-CM | POA: Diagnosis not present

## 2023-07-11 DIAGNOSIS — M25511 Pain in right shoulder: Secondary | ICD-10-CM

## 2023-07-11 DIAGNOSIS — M25611 Stiffness of right shoulder, not elsewhere classified: Secondary | ICD-10-CM

## 2023-07-11 DIAGNOSIS — M25641 Stiffness of right hand, not elsewhere classified: Secondary | ICD-10-CM | POA: Diagnosis not present

## 2023-07-11 DIAGNOSIS — G8929 Other chronic pain: Secondary | ICD-10-CM | POA: Diagnosis not present

## 2023-07-11 DIAGNOSIS — M79641 Pain in right hand: Secondary | ICD-10-CM | POA: Diagnosis not present

## 2023-07-11 NOTE — Therapy (Signed)
 OUTPATIENT PHYSICAL THERAPY SHOULDER    Patient Name: Veronica Galvan MRN: [id number] DOB:[birth year], 67 y.o., female Today's Date: 07/11/2023  END OF SESSION:  PT End of Session - 07/11/23 0808     Visit Number 56    Date for PT Re-Evaluation 08/06/23    Authorization Type [employer]    PT Start Time [phone]    PT Stop Time 0900    PT Time Calculation (min) 62 min    Activity Tolerance Patient tolerated treatment well    Behavior During Therapy WFL for tasks assessed/performed             Past Medical History:  Diagnosis Date   Allergy    Anxiety    Arthritis    hands   Cataract    Diabetes mellitus without complication (HCC)    type 2   Family history of adverse reaction to anesthesia    son has malignant hyperthermia, daughter does not daughter recently had c section without problems   GERD (gastroesophageal reflux disease)    Headache    sinus   Hyperlipidemia    Hypertension    PONV (postoperative nausea and vomiting)    nausea only   Ulcer, stomach peptic yrs ago   Past Surgical History:  Procedure Laterality Date   APPENDECTOMY     both hells bone spur repair     both heels with metal clips   both shoulder rotator cuff repair     CESAREAN SECTION     x 1   COLONOSCOPY WITH PROPOFOL N/A 09/07/2016   Procedure: COLONOSCOPY WITH PROPOFOL;  Surgeon: Charolett Bumpers, MD;  Location: [facility];  Service: Endoscopy;  Laterality: N/A;   colonscopy  06/2011   polyps   EYE SURGERY     FRACTURE SURGERY     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/10/2022   Procedure: REVERSE SHOULDER ARTHROPLASTY;  Surgeon: Francena Hanly, MD;  Location: [facility];  Service: Orthopedics;  Laterality: Right;  Please follow in room 6 if able   TUBAL LIGATION     VESICO-VAGINAL FISTULA REPAIR     Patient Active Problem List   Diagnosis Date Noted   Gastroesophageal reflux disease 11/16/2021   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 08/18/2021    Dermatofibroma 08/18/2021   History of malignant neoplasm of skin 08/18/2021   Lentigo 08/18/2021   Melanocytic nevi of trunk 08/18/2021   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   Rosacea 08/18/2021   Actinic keratosis 08/18/2021   Sensorineural hearing loss (SNHL) of left ear with unrestricted hearing of right ear 07/26/2021   Tinnitus of left ear 07/26/2021   Acute recurrent maxillary sinusitis 06/22/2021   Primary hypertension 01/12/2021   Hyperlipidemia associated with type 2 diabetes mellitus (HCC) 01/12/2021   Arthritis 01/12/2021   Type II diabetes mellitus (HCC) 11/25/2019   Ingrown toenail 09/05/2018   Neck pain 01/29/2018   Tick bite 10/24/2017    PCP: Dayton Scrape, [name]  REFERRING PROVIDER: Rennis Chris, MD  REFERRING DIAG: s/p right reverse TSA  THERAPY DIAG:  Muscle weakness (generalized)  Stiffness of right shoulder, not elsewhere classified  Acute pain of right shoulder  Localized edema  Rationale for Evaluation and Treatment: Rehabilitation  ONSET DATE: 11/05/22  SUBJECTIVE:  SUBJECTIVE STATEMENT:  Patient is doing okay, still c/o tightness in the right upper arm anterior and laterally PAIN:  Are you having pain? Yes: NPRS scale: 5/10 Pain location: right shoulder, HA Pain description: dull ache at rest, sharp with motions Aggravating factors: quick motions, movements pain up to 10/10 Relieving factors: ice, rest, Tylenol at best 3/10  PRECAUTIONS: Shoulder Protocol in the chart  RED FLAGS: None   WEIGHT BEARING RESTRICTIONS: No  FALLS:  Has patient fallen in last 6 months? Yes. Number of falls 1  LIVING ENVIRONMENT: Lives with: lives alone Lives in: House/apartment Stairs: No Has following equipment at home: None  OCCUPATION: retired  PLOF:  Independent  PATIENT GOALS:dress without difficulty, do hair, have good ROM and less pain  NEXT MD VISIT:   OBJECTIVE:   DIAGNOSTIC FINDINGS:  See above  PATIENT SURVEYS:  FOTO 21  COGNITION: Overall cognitive status: Within functional limits for tasks assessed     SENSATION: WFL  POSTURE: Fwd head, rounded shoulders, elevated and guarded shoulder  UPPER EXTREMITY ROM:   Active ROM Right PROM eval Right PROM Supine 01/05/23 PROM  01/26/23 PROM 02/09/23 AROM sitting 02/14/23 AROM 02/21/23 AROM  03/02/23 AROM  03/22/23 AAROM 03/28/23 AROM Standing 04/13/23 AROM Standing 05/02/23 AROM  Standing 05/11/23 AROM  Standing 05/25/23 AROM Standing 06/01/23 AROM Standing  07/06/23  Shoulder flexion 35  118  123 90 96 95 100 111 110 112 120 125  129 132  Shoulder extension 5                 Shoulder abduction 20  90 92  73 80 83 90 93 90 92 98 100 102  105  Shoulder adduction                  Shoulder internal rotation 15             40 40   Shoulder external rotation 20  45 30  41  45 50 53 56 56 60 66 67 70  Elbow flexion 120                 Elbow extension 5                 Wrist flexion                  Wrist extension                  Wrist ulnar deviation                  Wrist radial deviation                  Wrist pronation                  Wrist supination                  (Blank rows = not tested)  UPPER EXTREMITY MMT:  No tested due to recent surgery  MMT Right eval Left eval  Shoulder flexion    Shoulder extension    Shoulder abduction    Shoulder adduction    Shoulder internal rotation    Shoulder external rotation    Middle trapezius    Lower trapezius    Elbow flexion    Elbow extension    Wrist flexion    Wrist extension    Wrist ulnar deviation    Wrist radial deviation    Wrist pronation  Wrist supination    Grip strength (lbs)    (Blank rows = not tested)  PALPATION:  Very tight and tender in the pectoral, upper trap, the entire  right upper arm   TODAY'S TREATMENT:                                                                                                                                         DATE:  07/11/23 UBE level 3 x 4 minutes 15# lats 2x10 5# chest press Doorway stretch STM to the right upper trap, neck, pectoral area and the right upper arm Passive stretch of the right upper arm all motions  07/06/23 Push pull with Nustep and then cross body push and pull Passive stretch to the right UE all motions Right UE STM [place] to the right upper arm 100% Supine 3# punches 3# isometric circles 2# ER/IR 2# flexion Vaso medium pressure 34 degrees  07/03/23 Nustep level 5 x 5 minutes Doorway stretch Star gazer stretch Cross body stretch STM to the right bicep, deltoid, pectoral and upper trap and rhomboid, PROM all right shoulder motions Vaso medium pressure 35 degrees  06/29/23 UBE level 4 x 4 minutes Doorway stretch 3 ways Supine Passive stretch all motions 3# punches, flexion, serratus and isometric circles STM to the right upper arm, right trap and pectoral area Vaso medium pressure 34 degrees  06/27/23 UBE level 4 x 5 minutes Supine 2# and 3# serratus, isometric circles, flexion, ER/IR punches STM to the right biceps, deltoid, pectoral area, very tight and tender in the pectorals Passive stretch of the right shoulder all motions except IR, some prolonged star gazer stretch Vaso medium pressure 34 degrees  06/22/23 UBE level 4 x 6 minutes 5# chest press 2# wate bar chest press, flexion to shoulder and behind back PROM of the right shoulder all motions STM to the right deltoid and bicep area Vaso right shoulder 34 degrees medium pressure  06/13/23 UBE level 4 x 6 minutes Wall slides Stick exercises with some passive stretch Supine 2# and 3# exercises to increase strength and function Supine passive stretch of the right shoulder all motions to her tolerance some stretch of the pectoral,  and cross body stretch with star gazer stretch STM to the right upper arm and the right upper trap, neck and rhomboid, a lot of trigger points in the rhomboid and upper trap that referred pain to the head and down the right arm Vaso medium pressure 34 degrees  06/08/23 UBE level 4 x 6 minutes chest press 5# Lats 15# Wall slides Supine PROM/joint mobs STM to the neck upper trap, rhomboid and upper arm Vaso medium pressure 35 degrees  PATIENT EDUCATION: Education details: poc/hep Person educated: Patient Education method: Programmer, multimedia, Facilities manager, Actor cues, Verbal cues, and Handouts Education comprehension: verbalized understanding  HOME EXERCISE PROGRAM: Access Code: [email or web address] URL: [email or web address] Date: 12/20/2022 Prepared by: Stacie Glaze  Exercises - Seated Shoulder Flexion Towel Slide at Table Top  - 2 x daily - 7 x weekly - 2 sets - 10 reps - 3 hold - Seated Shoulder External Rotation PROM on Table  - 2 x daily - 7 x weekly - 2 sets - 10 reps - 3 hold - Seated Elbow Extension and Shoulder External Rotation AAROM at Table with Towel  - 2 x daily - 7 x weekly - 2 sets - 10 reps - 3 hold - Circular Shoulder Pendulum with Table Support  - 2 x daily - 7 x weekly - 2 sets - 10 reps - 3 hold  ASSESSMENT:  CLINICAL IMPRESSION: Patient still with some c/o tingling in the right fingers She felt like the [place] that we did last week may have helped, we may do this at the next visit.  Still with good motions just tight and very sore  OBJECTIVE IMPAIRMENTS: cardiopulmonary status limiting activity, decreased activity tolerance, decreased endurance, decreased ROM, decreased strength, increased edema, increased muscle spasms, impaired flexibility, impaired UE functional use, improper body mechanics, postural dysfunction, and pain .  REHAB POTENTIAL: Good  CLINICAL DECISION MAKING: Evolving/moderate complexity  EVALUATION COMPLEXITY: Low   GOALS: Goals reviewed  with patient? Yes  SHORT TERM GOALS: Target date: 01/01/23  Independent with initial HEP Goal status: 12/27/22 MET  LONG TERM GOALS: Target date: 03/22/23  Decrease pain 50% Goal status: 03/22/23 progressing  2.  Dress without difficulty Goal status: progressing 06/22/23  3.  Do hair without difficulty Goal status: able to reach the top of head at times progressing 06/22/23  4.  Increase AROM right shoulder flexion to 130 degrees Goal status: 06/01/23 progressing 120 degrees  5.  Increase right shoulder ER to 60 degrees Goal status: met 07/06/23  6.  Return to water aerobics and or gym activity Goal status:progressing met 06/01/23  PLAN:  PT FREQUENCY: 1-2x/week  PT DURATION: 12 weeks  PLANNED INTERVENTIONS: Therapeutic exercises, Therapeutic activity, Neuromuscular re-education, Balance training, Gait training, Patient/Family education, Self Care, Joint mobilization, Dry Needling, Electrical stimulation, Cryotherapy, Vasopneumatic device, and Manual therapy  PLAN FOR NEXT SESSION:   may try [place] again Jearld Lesch, PT 07/11/2023, 8:15 AM [facility] at [facility] [address] [facility]. [place], [place], [address] Phone: [phone]   Fax:  [phone]

## 2023-07-13 ENCOUNTER — Ambulatory Visit: Payer: Medicare Other | Admitting: Occupational Therapy

## 2023-07-13 ENCOUNTER — Ambulatory Visit: Payer: Medicare Other | Admitting: Physical Therapy

## 2023-07-13 ENCOUNTER — Encounter: Payer: Self-pay | Admitting: Physical Therapy

## 2023-07-13 DIAGNOSIS — M25641 Stiffness of right hand, not elsewhere classified: Secondary | ICD-10-CM

## 2023-07-13 DIAGNOSIS — M79642 Pain in left hand: Secondary | ICD-10-CM

## 2023-07-13 DIAGNOSIS — M25511 Pain in right shoulder: Secondary | ICD-10-CM

## 2023-07-13 DIAGNOSIS — M25611 Stiffness of right shoulder, not elsewhere classified: Secondary | ICD-10-CM | POA: Diagnosis not present

## 2023-07-13 DIAGNOSIS — M79641 Pain in right hand: Secondary | ICD-10-CM | POA: Diagnosis not present

## 2023-07-13 DIAGNOSIS — M25642 Stiffness of left hand, not elsewhere classified: Secondary | ICD-10-CM | POA: Diagnosis not present

## 2023-07-13 DIAGNOSIS — M6281 Muscle weakness (generalized): Secondary | ICD-10-CM

## 2023-07-13 DIAGNOSIS — R519 Headache, unspecified: Secondary | ICD-10-CM | POA: Diagnosis not present

## 2023-07-13 DIAGNOSIS — M542 Cervicalgia: Secondary | ICD-10-CM | POA: Diagnosis not present

## 2023-07-13 DIAGNOSIS — R278 Other lack of coordination: Secondary | ICD-10-CM | POA: Diagnosis not present

## 2023-07-13 DIAGNOSIS — R600 Localized edema: Secondary | ICD-10-CM

## 2023-07-13 DIAGNOSIS — G8929 Other chronic pain: Secondary | ICD-10-CM | POA: Diagnosis not present

## 2023-07-13 NOTE — Therapy (Signed)
 OUTPATIENT OCCUPATIONAL THERAPY ORTHO Treatment  Patient Name: Veronica Galvan MRN: [id number] DOB:06/22/1955, 68 y.o., female Today's Date: 07/13/2023  PCP: Olive Bass [name] REFERRING PROVIDER: Olive Bass [name]  END OF SESSION:  OT End of Session - 07/13/23 1244     Visit Number 10    Number of Visits 25    Date for OT Re-Evaluation 08/16/23    Authorization Type [employer] M[employer]    Authorization - Visit Number 10    Progress Note Due on Visit 10    OT Start Time 0845    OT Stop Time 0930    OT Time Calculation (min) 45 min                 Past Medical History:  Diagnosis Date   Allergy    Anxiety    Arthritis    hands   Cataract    Diabetes mellitus without complication (HCC)    type 2   Family history of adverse reaction to anesthesia    son has malignant hyperthermia, daughter does not daughter recently had c section without problems   GERD (gastroesophageal reflux disease)    Headache    sinus   Hyperlipidemia    Hypertension    PONV (postoperative nausea and vomiting)    nausea only   Ulcer, stomach peptic yrs ago   Past Surgical History:  Procedure Laterality Date   APPENDECTOMY     both hells bone spur repair     both heels with metal clips   both shoulder rotator cuff repair     CESAREAN SECTION     x 1   COLONOSCOPY WITH PROPOFOL N/A 09/07/2016   Procedure: COLONOSCOPY WITH PROPOFOL;  Surgeon: Charolett Bumpers, MD;  Location: [facility];  Service: Endoscopy;  Laterality: N/A;   colonscopy  06/2011   polyps   EYE SURGERY     FRACTURE SURGERY     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/10/2022   Procedure: REVERSE SHOULDER ARTHROPLASTY;  Surgeon: Francena Hanly, MD;  Location: [facility];  Service: Orthopedics;  Laterality: Right;  Please follow in room 6 if able   TUBAL LIGATION     VESICO-VAGINAL FISTULA REPAIR     Patient Active Problem List   Diagnosis Date Noted   Gastroesophageal reflux disease 11/16/2021    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 08/18/2021   Dermatofibroma 08/18/2021   History of malignant neoplasm of skin 08/18/2021   Lentigo 08/18/2021   Melanocytic nevi of trunk 08/18/2021   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   Rosacea 08/18/2021   Actinic keratosis 08/18/2021   Sensorineural hearing loss (SNHL) of left ear with unrestricted hearing of right ear 07/26/2021   Tinnitus of left ear 07/26/2021   Acute recurrent maxillary sinusitis 06/22/2021   Primary hypertension 01/12/2021   Hyperlipidemia associated with type 2 diabetes mellitus (HCC) 01/12/2021   Arthritis 01/12/2021   Type II diabetes mellitus (HCC) 11/25/2019   Ingrown toenail 09/05/2018   Neck pain 01/29/2018   Tick bite 10/24/2017    ONSET DATE: 05/19/23  REFERRING DIAG:  Diagnosis  M79.641,M79.642 (ICD-10-CM) - Bilateral hand pain    THERAPY DIAG:  Stiffness of right shoulder, not elsewhere classified  Acute pain of right shoulder  Localized edema  Stiffness of right hand, not elsewhere classified  Pain in right hand  Muscle weakness (generalized)  Stiffness of left hand, not elsewhere classified  Pain in left hand  Other lack of coordination  Rationale for  Evaluation and Treatment: Rehabilitation  SUBJECTIVE:   SUBJECTIVE STATEMENT: Pt reports her hands are stiff Pt accompanied by: self  PERTINENT HISTORY: 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 Yes: NPRS scale: 3/10 Pain location: bilateral hands Pain description: aching, stiff Aggravating factors: gripping  Relieving factors: heat, meds Pt also has shoulder pain 4-5/10 which OT will not address as PT is working with pt. to address.  FALLS: Has patient fallen  in last 6 months? No  LIVING ENVIRONMENT: Lives with: lives alone Lives in: House/apartment Stairs: yes   PLOF: Independent  PATIENT GOALS: improve functional use of hands and learn adapted strategies for ADLs/IADLs.  NEXT MD VISIT: unknown  OBJECTIVE:  Note: Objective measures were completed at Evaluation unless otherwise noted.  HAND DOMINANCE: Right  ADLs: Overall ADLs: unable to grip credit card to remove from ATM Transfers/ambulation related to ADLs: mod I Eating: drops silverware Grooming: drops toothbrush, uses electric toothbrush, holding comb Upper body dressing: adjusting bra Lower body dressing: difficulty pulling up pants  Toileting: hygeine was difficult initally Bathing: mod I, difficult with shaving Tub shower transfers: walk in shower    FUNCTIONAL OUTCOME MEASURES: Quick Dash: 05/30/23- 75%% disability  UPPER EXTREMITY ROM:   RUE wrist flexion/ extension: 70/ 50, LUE wrist flexion/ extension: 70/ 45 Pt demonstrates grossly 50% composite flexion for right and 555 with left.   Active ROM Right eval Left eval  Thumb MCP (0-60) 45 55  Thumb IP (0-80) 25 10  Thumb Radial abd/add (0-55)     Thumb Palmar abd/add (0-45)     Thumb Opposition to Small Finger yes yes   Index MCP (0-90)     Index PIP (0-100)     Index DIP (0-70)      Long MCP (0-90)      Long PIP (0-100)      Long DIP (0-70)      Ring MCP (0-90)      Ring PIP (0-100)      Ring DIP (0-70)      Little MCP (0-90)      Little PIP (0-100)      Little DIP (0-70)      (Blank rows = not tested)   HAND FUNCTION: Grip strength: Right: 8 lbs; Left: 4 lbs,   SENSATION: Light touch: Impaired index finger LUE  EDEMA: Pt with bony defomities at PIP joints of all digits which pt reports are bone spurs, Pt also has bony deformity at DIP for right thumb, index and 5th digit and LUE small and index fingers  COGNITION: Overall cognitive status: Within functional limits for tasks  assessed   OBSERVATIONS: Pleasant female desires to increase functional use of bilateral UE's   TREATMENT DATE: 07/13/23 Pt used button hook to fastn the buttons on her shirt. Paraffin to left hand x 10 mins while pt perfromed A/ROM  for RUE: blocking exercises followed by P/ROM composite flexion to digits individually and compositely, A/ROM thumb  flexion/ extension and finger thumb opposition..  Paraffin to LUE x 10 mins  while RUE A/ROM blocking exercises followed by P/ROM composite flexion to digits individually and compositely, finger thumb opposition and thumb flexion. Pt reports she received her adpated writing utensils, pen again and they are working well. Pt demonstrates improved bilateral  UE flexibility.  07/06/23 Pt was shown buttonhook and she practiced using it with good success. Pt plans to order one. Pt was also shown the Pen again for writing acitivities. pt returned demonstration of use. It places RUE in a better position for writing. Paraffin to right hand x 10 mins while pt perfromed A/ROM  for LUE: blocking exercises followed by P/ROM composite flexion to digits individually and compositely, A/ROM thumb  flexion/ extension.  Paraffin to LUE x 10 mins  while RUE A/ROM blocking exercises followed by P/ROM composite flexion to digits individually and compositely, A/ROM .  Yellow putty was issued for sustained grip bilateral UE's and tip pinch for LUE, pt returned demonstration grossly 20 reps each min v.c  Wash cloth scrunches and flattening 10 reps min v.c   06/29/23-Paraffin to left hand x 10 mins while pt perfromed A/ROM  for RUE: blocking exercises for each digit, followed by P/ROM composite flexion to digits individually and compositely. Paraffin to RUE x 10 mins  while LUE A/ROM blocking exercises for each digit,  followed by P/ROM composite flexion to digits individually and compositely.  Yellow putty was issued for sustained grip bilateral UE's and tip pinch for LUE, pt returned  demonstration grossly 20 reps each min v.c  Wash cloth scrunches and flattening 5-10 reps each hand, min v.c    06/27/23- Paraffin to left hand x 10 mins while pt perfromed A/ROM  for RUE: blocking exercises for each digit, thumb flexion extension, followed by P/ROM composite flexion to digits individually and compositely. Pt demonstrates improved composite finger flexion end of session. Paraffin to RUE x 10 mins  while LUE A/ROM blocking exercises for each digit, thumb flexion extension, followed by P/ROM composite flexion to digits individually and compositely. Pt will improved composite finger flexion end of session.  06/22/23- Pt brought in her splints, the splint for left hand is fitting well. Pt can benefit from a different splint for RUE that places index finger in greater extension. Therapist fabricated an index finger splint to position PIP in extension and to address DIP deformity. Pt reports splint fits well A/ROM PIP blocking to digits of left and right hands, thumb flexion/ extension, opposition, min v.c and min facilitation Pt brought in her AE bottle openers and pt practiced opening plastic bottle. Pt was issued additional foam grips for pens.    06/20/23 Today's therapy session was focused on fabrication of custom circumferential finger splints for bilateral index fingers to improve DIP positioning to minimize risk for additional deformity. 1 splint was fabicated for right index and 2 for left. Pt's second index finger splint also promotes finger extension at PIP joint.  Pt was instructed in splint wear, care and precautions. Pt. reports AE is helping at home. 06/14/23 paraffin x 10 mins to bilateral UE's Ice pack to Left shoulder x 10 mins for pain relief, no adverse reactions Reviewed PIP, DIP blocking exercises, finger thumb opposition and thumb flexion for bilateral UE's. Discussed recommendations for adapted strategies for ADLs including use of food processor to shred chicken. Pt  reports using food processory for cutting vegetables and it worked well. Pt reports she purchased several items we discussed last visit and overall they are working well. Therapist began assessing splinting needs, oval 8's were tried but did not provide correct positioning, pt will benefit from custom finger splints in the future to improve DIP positioning.    06/06/22- Paraffin x 10 mins to bilateral UE's for pain and stiffness, no adverse reactions  Reviewed A/ROM HEP issued last visit 5-10 reps each bilateral UE's. Discussions regarding activity modification and potential AE including opening bottles/ jars, and use of food processor for chopping vegatables instead of chopping with a knife. Pt was shown some options on the internet. Discussed importance of warming up before activity with paraffin or gentle A/ROM   05/30/23- Paraffin x 10 mins to bilateral UE's for pain and stiffness, then pt was instucted in A/ROM HEP. See pt. instructions. Therapist began discussing activity modifications and AE. Pt was provided with a foam grip for increased ease with handwriting.  Pt tried using the sock aide however it was more challenging than donning the sock the traditional way.   05/24/23 eval only                                                                                                                                PATIENT EDUCATION: Education details:  yellow putty HEP- see pt instructions Person educated: Patient Education method: Explanation, demonstration, v.c , handout Education comprehension: verbalized understanding, returned demonstration  HOME EXERCISE PROGRAM: AROM  GOALS: Goals reviewed with patient? Yes  SHORT TERM GOALS: Target date: 06/24/23  I with HEP  Goal status:  met 06/07/23  2.  I with positioning/ splinting prn to minimize pain and defomity   Goal status: met, 07/13/23  3.  Pt will increase bilateral grip strength by 5 lbs for increased UE functional  use. Baseline: 05/30/23- RUE 8 lbs, LUE 4 lbs Goal status:  ongoing, 07/05/23    LONG TERM GOALS: Target date: 08/16/23  I with updated HEP  Goal status: ongoing  2.  Pt will improve Quick Dash score to 70% disability Baseline: 75% (05/30/23) Goal status:  ongoing  3.  Pt will demonstrate at least 65% composte flexion for LUE for increased functional use.  Goal status: ongoing  4.  I with adapted strategies/ adapted equipment to minimize pain and to increase pt I with ADLs/IADLs  Goal status:  ongoing, pt educated in buttook and other adapted devices/ strategies for opening continers.07/13/23  5.  Pt will demonstrate at least 65% composite flexion for RUE for increased functional use.  Goal status:  ongoing    ASSESSMENT:  CLINICAL IMPRESSION: For the reporting period of :05/24/23- 07/13/23 Pt has met 2/3 short term goals. She is progressing towards all remainging goals and demonstrates improved ROM and flexibility in her hands. Pt can benefit from continued skilled occupational therapy to address the perfromance deficits below and to maximize pt's safety and I with ADLs/IADLs.PERFORMANCE DEFICITS: in functional skills including ADLs, IADLs, coordination, sensation, edema, ROM, strength, pain, flexibility, Fine motor control, Gross motor control, endurance, decreased knowledge of precautions, decreased knowledge of use of DME, and UE functional use,, and psychosocial skills including coping strategies, environmental adaptation, habits, interpersonal interactions, and routines and behaviors.   IMPAIRMENTS: are limiting patient from ADLs, IADLs, rest and sleep, education, play, leisure, and social participation.  COMORBIDITIES: may have co-morbidities  that affects occupational performance. Patient will benefit from skilled OT to address above impairments and improve overall function.  MODIFICATION OR ASSISTANCE TO COMPLETE EVALUATION: No modification of tasks or assist necessary to  complete an evaluation.  OT OCCUPATIONAL PROFILE AND HISTORY: Detailed assessment: Review of records and additional review of physical, cognitive, psychosocial history related to current functional performance.  CLINICAL DECISION MAKING: LOW - limited treatment options, no task modification necessary  REHAB POTENTIAL: Good  EVALUATION COMPLEXITY: Low      PLAN:  OT FREQUENCY: 2x/week plus eval  OT DURATION: 12 weeks  PLANNED INTERVENTIONS: 97168 OT Re-evaluation, 97535 self care/ADL training, 16109 therapeutic exercise, 97530 therapeutic activity, 97112 neuromuscular re-education, 97140 manual therapy, 97113 aquatic therapy, 97035 ultrasound, 97018 paraffin, 60454 fluidotherapy, 97010 moist heat, 97010 cryotherapy, 97034 contrast bath, 97760 Orthotics management and training, 09811 Splinting (initial encounter), [id number] Subsequent splinting/medication, passive range of motion, energy conservation, coping strategies training, patient/family education, and DME and/or AE instructions  RECOMMENDED OTHER SERVICES: none  CONSULTED AND AGREED WITH PLAN OF CARE: Patient  PLAN FOR NEXT SESSION: check on putty HEP, continue with AE, paraffin   Tylan Briguglio, OT 07/13/2023, 12:50 PM

## 2023-07-13 NOTE — Therapy (Signed)
 OUTPATIENT PHYSICAL THERAPY SHOULDER    Patient Name: Veronica Galvan MRN: [id number] DOB:[birth year], 68 y.o., female Today's Date: 07/13/2023  END OF SESSION:  PT End of Session - 07/13/23 0757     Visit Number 57    Date for PT Re-Evaluation 08/06/23    Authorization Type [employer] [place]    PT Start Time [phone]    PT Stop Time 0855    PT Time Calculation (min) 57 min    Activity Tolerance Patient tolerated treatment well    Behavior During Therapy WFL for tasks assessed/performed             Past Medical History:  Diagnosis Date   Allergy    Anxiety    Arthritis    hands   Cataract    Diabetes mellitus without complication (HCC)    type 2   Family history of adverse reaction to anesthesia    son has malignant hyperthermia, daughter does not daughter recently had c section without problems   GERD (gastroesophageal reflux disease)    Headache    sinus   Hyperlipidemia    Hypertension    PONV (postoperative nausea and vomiting)    nausea only   Ulcer, stomach peptic yrs ago   Past Surgical History:  Procedure Laterality Date   APPENDECTOMY     both hells bone spur repair     both heels with metal clips   both shoulder rotator cuff repair     CESAREAN SECTION     x 1   COLONOSCOPY WITH PROPOFOL N/A 09/07/2016   Procedure: COLONOSCOPY WITH PROPOFOL;  Surgeon: Charolett Bumpers, MD;  Location: [facility];  Service: Endoscopy;  Laterality: N/A;   colonscopy  06/2011   polyps   EYE SURGERY     FRACTURE SURGERY     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/10/2022   Procedure: REVERSE SHOULDER ARTHROPLASTY;  Surgeon: Francena Hanly, MD;  Location: [facility];  Service: Orthopedics;  Laterality: Right;  Please follow in room 6 if able   TUBAL LIGATION     VESICO-VAGINAL FISTULA REPAIR     Patient Active Problem List   Diagnosis Date Noted   Gastroesophageal reflux disease 11/16/2021   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 08/18/2021    Dermatofibroma 08/18/2021   History of malignant neoplasm of skin 08/18/2021   Lentigo 08/18/2021   Melanocytic nevi of trunk 08/18/2021   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   Rosacea 08/18/2021   Actinic keratosis 08/18/2021   Sensorineural hearing loss (SNHL) of left ear with unrestricted hearing of right ear 07/26/2021   Tinnitus of left ear 07/26/2021   Acute recurrent maxillary sinusitis 06/22/2021   Primary hypertension 01/12/2021   Hyperlipidemia associated with type 2 diabetes mellitus (HCC) 01/12/2021   Arthritis 01/12/2021   Type II diabetes mellitus (HCC) 11/25/2019   Ingrown toenail 09/05/2018   Neck pain 01/29/2018   Tick bite 10/24/2017    PCP: Dayton Scrape, [name]  REFERRING PROVIDER: Rennis Chris, MD  REFERRING DIAG: s/p right reverse TSA  THERAPY DIAG:  Muscle weakness (generalized)  Stiffness of right shoulder, not elsewhere classified  Acute pain of right shoulder  Localized edema  Cervicalgia  Rationale for Evaluation and Treatment: Rehabilitation  ONSET DATE: 11/05/22  SUBJECTIVE:  SUBJECTIVE STATEMENT:  Still with sharp shooting pains at times in the bicep and the lateral upper arm PAIN:  Are you having pain? Yes: NPRS scale: 5/10 Pain location: right shoulder, HA Pain description: dull ache at rest, sharp with motions Aggravating factors: quick motions, movements pain up to 10/10 Relieving factors: ice, rest, Tylenol at best 3/10  PRECAUTIONS: Shoulder Protocol in the chart  RED FLAGS: None   WEIGHT BEARING RESTRICTIONS: No  FALLS:  Has patient fallen in last 6 months? Yes. Number of falls 1  LIVING ENVIRONMENT: Lives with: lives alone Lives in: House/apartment Stairs: No Has following equipment at home: None  OCCUPATION: retired  PLOF:  Independent  PATIENT GOALS:dress without difficulty, do hair, have good ROM and less pain  NEXT MD VISIT:   OBJECTIVE:   DIAGNOSTIC FINDINGS:  See above  PATIENT SURVEYS:  FOTO 21  COGNITION: Overall cognitive status: Within functional limits for tasks assessed     SENSATION: WFL  POSTURE: Fwd head, rounded shoulders, elevated and guarded shoulder  UPPER EXTREMITY ROM:   Active ROM Right PROM eval Right PROM Supine 01/05/23 PROM  01/26/23 PROM 02/09/23 AROM sitting 02/14/23 AROM 02/21/23 AROM  03/02/23 AROM  03/22/23 AAROM 03/28/23 AROM Standing 04/13/23 AROM Standing 05/02/23 AROM  Standing 05/11/23 AROM  Standing 05/25/23 AROM Standing 06/01/23 AROM Standing  07/06/23  Shoulder flexion 35  118  123 90 96 95 100 111 110 112 120 125  129 132  Shoulder extension 5                 Shoulder abduction 20  90 92  73 80 83 90 93 90 92 98 100 102  105  Shoulder adduction                  Shoulder internal rotation 15             40 40   Shoulder external rotation 20  45 30  41  45 50 53 56 56 60 66 67 70  Elbow flexion 120                 Elbow extension 5                 Wrist flexion                  Wrist extension                  Wrist ulnar deviation                  Wrist radial deviation                  Wrist pronation                  Wrist supination                  (Blank rows = not tested)  UPPER EXTREMITY MMT:  No tested due to recent surgery  MMT Right eval Left eval  Shoulder flexion    Shoulder extension    Shoulder abduction    Shoulder adduction    Shoulder internal rotation    Shoulder external rotation    Middle trapezius    Lower trapezius    Elbow flexion    Elbow extension    Wrist flexion    Wrist extension    Wrist ulnar deviation    Wrist radial deviation    Wrist pronation  Wrist supination    Grip strength (lbs)    (Blank rows = not tested)  PALPATION:  Very tight and tender in the pectoral, upper trap, the entire  right upper arm   TODAY'S TREATMENT:                                                                                                                                         DATE:  07/13/23 [place] to the right biceps and lateral right upper arm/shoulder STM to the above Passive stretch all motions of the right shoulder to her tolerance, tried some contract relax  Added to HEP below and performed  07/11/23 UBE level 3 x 4 minutes 15# lats 2x10 5# chest press Doorway stretch STM to the right upper trap, neck, pectoral area and the right upper arm Passive stretch of the right upper arm all motions  07/06/23 Push pull with Nustep and then cross body push and pull Passive stretch to the right UE all motions Right UE STM [place] to the right upper arm 100% Supine 3# punches 3# isometric circles 2# ER/IR 2# flexion Vaso medium pressure 34 degrees  07/03/23 Nustep level 5 x 5 minutes Doorway stretch Star gazer stretch Cross body stretch STM to the right bicep, deltoid, pectoral and upper trap and rhomboid, PROM all right shoulder motions Vaso medium pressure 35 degrees  06/29/23 UBE level 4 x 4 minutes Doorway stretch 3 ways Supine Passive stretch all motions 3# punches, flexion, serratus and isometric circles STM to the right upper arm, right trap and pectoral area Vaso medium pressure 34 degrees  06/27/23 UBE level 4 x 5 minutes Supine 2# and 3# serratus, isometric circles, flexion, ER/IR punches STM to the right biceps, deltoid, pectoral area, very tight and tender in the pectorals Passive stretch of the right shoulder all motions except IR, some prolonged star gazer stretch Vaso medium pressure 34 degrees  06/22/23 UBE level 4 x 6 minutes 5# chest press 2# wate bar chest press, flexion to shoulder and behind back PROM of the right shoulder all motions STM to the right deltoid and bicep area Vaso right shoulder 34 degrees medium pressure  06/13/23 UBE level 4 x 6  minutes Wall slides Stick exercises with some passive stretch Supine 2# and 3# exercises to increase strength and function Supine passive stretch of the right shoulder all motions to her tolerance some stretch of the pectoral, and cross body stretch with star gazer stretch STM to the right upper arm and the right upper trap, neck and rhomboid, a lot of trigger points in the rhomboid and upper trap that referred pain to the head and down the right arm Vaso medium pressure 34 degrees  06/08/23 UBE level 4 x 6 minutes chest press 5# Lats 15# Wall slides Supine PROM/joint mobs STM to the neck upper trap, rhomboid and upper arm Vaso medium pressure 35 degrees  PATIENT EDUCATION: Education details: poc/hep Person educated: Patient Education method: Programmer, multimedia, Facilities manager, Actor cues, Verbal cues, and Handouts Education comprehension: verbalized understanding  HOME EXERCISE PROGRAM: Access Code: [email or web address] URL: [email or web address] Date: 12/20/2022 Prepared by: Stacie Glaze  Exercises - Seated Shoulder Flexion Towel Slide at Table Top  - 2 x daily - 7 x weekly - 2 sets - 10 reps - 3 hold - Seated Shoulder External Rotation PROM on Table  - 2 x daily - 7 x weekly - 2 sets - 10 reps - 3 hold - Seated Elbow Extension and Shoulder External Rotation AAROM at Table with Towel  - 2 x daily - 7 x weekly - 2 sets - 10 reps - 3 hold - Circular Shoulder Pendulum with Table Support  - 2 x daily - 7 x weekly - 2 sets - 10 reps - 3 hold  Access Code: [email or web address] URL: [email or web address] Date: 07/13/2023 Prepared by: Stacie Glaze  Exercises - Isometric Shoulder Flexion at Wall  - 1 x daily - 7 x weekly - 1 sets - 10 reps - 3 hold - Standing Isometric Shoulder Internal Rotation at Doorway  - 1 x daily - 7 x weekly - 1 sets - 10 reps - 3 hold - Isometric Shoulder Extension at Wall  - 1 x daily - 7 x weekly - 1 sets - 10 reps - 3 hold - Isometric Shoulder  Abduction at Wall  - 1 x daily - 7 x weekly - 1 sets - 10 reps - 3 hold - Standing Isometric Shoulder External Rotation with Doorway  - 1 x daily - 7 x weekly - 1 sets - 10 reps - 3 hold  ASSESSMENT:  CLINICAL IMPRESSION: Patient frustrated with her pain levels and the catching, she reports that she has spoken with her PCP and is thinking of an MRI.  We tried the [place] again today.  I added isometrics today to see if we can build strength and or get things back where they are supposed to be  OBJECTIVE IMPAIRMENTS: cardiopulmonary status limiting activity, decreased activity tolerance, decreased endurance, decreased ROM, decreased strength, increased edema, increased muscle spasms, impaired flexibility, impaired UE functional use, improper body mechanics, postural dysfunction, and pain .  REHAB POTENTIAL: Good  CLINICAL DECISION MAKING: Evolving/moderate complexity  EVALUATION COMPLEXITY: Low   GOALS: Goals reviewed with patient? Yes  SHORT TERM GOALS: Target date: 01/01/23  Independent with initial HEP Goal status: 12/27/22 MET  LONG TERM GOALS: Target date: 03/22/23  Decrease pain 50% Goal status: 03/22/23 progressing  2.  Dress without difficulty Goal status: progressing 06/22/23  3.  Do hair without difficulty Goal status: able to reach the top of head at times progressing 06/22/23  4.  Increase AROM right shoulder flexion to 130 degrees Goal status: 06/01/23 progressing 120 degrees  5.  Increase right shoulder ER to 60 degrees Goal status: met 07/06/23  6.  Return to water aerobics and or gym activity Goal status:progressing met 06/01/23  PLAN:  PT FREQUENCY: 1-2x/week  PT DURATION: 12 weeks  PLANNED INTERVENTIONS: Therapeutic exercises, Therapeutic activity, Neuromuscular re-education, Balance training, Gait training, Patient/Family education, Self Care, Joint mobilization, Dry Needling, Electrical stimulation, Cryotherapy, Vasopneumatic device, and Manual therapy  PLAN  FOR NEXT SESSION:   see how the weekend goes [place], PT 07/13/2023, 7:59 AM [facility] [facility]. [place], [place], [address] Phone: [phone]   Fax:  [phone]

## 2023-07-17 ENCOUNTER — Ambulatory Visit: Payer: Medicare Other | Admitting: Occupational Therapy

## 2023-07-18 ENCOUNTER — Ambulatory Visit: Payer: Medicare Other | Admitting: Physical Therapy

## 2023-07-18 ENCOUNTER — Encounter: Payer: Self-pay | Admitting: Physical Therapy

## 2023-07-18 ENCOUNTER — Ambulatory Visit: Payer: Medicare Other | Admitting: Occupational Therapy

## 2023-07-18 DIAGNOSIS — M25511 Pain in right shoulder: Secondary | ICD-10-CM | POA: Diagnosis not present

## 2023-07-18 DIAGNOSIS — M25611 Stiffness of right shoulder, not elsewhere classified: Secondary | ICD-10-CM | POA: Diagnosis not present

## 2023-07-18 DIAGNOSIS — R519 Headache, unspecified: Secondary | ICD-10-CM | POA: Diagnosis not present

## 2023-07-18 DIAGNOSIS — M79642 Pain in left hand: Secondary | ICD-10-CM | POA: Diagnosis not present

## 2023-07-18 DIAGNOSIS — R278 Other lack of coordination: Secondary | ICD-10-CM | POA: Diagnosis not present

## 2023-07-18 DIAGNOSIS — M79641 Pain in right hand: Secondary | ICD-10-CM | POA: Diagnosis not present

## 2023-07-18 DIAGNOSIS — R600 Localized edema: Secondary | ICD-10-CM | POA: Diagnosis not present

## 2023-07-18 DIAGNOSIS — M25642 Stiffness of left hand, not elsewhere classified: Secondary | ICD-10-CM | POA: Diagnosis not present

## 2023-07-18 DIAGNOSIS — M25641 Stiffness of right hand, not elsewhere classified: Secondary | ICD-10-CM | POA: Diagnosis not present

## 2023-07-18 DIAGNOSIS — G8929 Other chronic pain: Secondary | ICD-10-CM | POA: Diagnosis not present

## 2023-07-18 DIAGNOSIS — M6281 Muscle weakness (generalized): Secondary | ICD-10-CM | POA: Diagnosis not present

## 2023-07-18 DIAGNOSIS — M542 Cervicalgia: Secondary | ICD-10-CM

## 2023-07-18 NOTE — Therapy (Signed)
 OUTPATIENT PHYSICAL THERAPY SHOULDER    Patient Name: Veronica Galvan MRN: [id number] DOB:09-Sep-1955, 68 y.o., female Today's Date: 07/18/2023  END OF SESSION:  PT End of Session - 07/18/23 1001     Visit Number 58    Date for PT Re-Evaluation 08/06/23    Authorization Type [employer] [place]    PT Start Time [phone]    PT Stop Time 0845    PT Time Calculation (min) 47 min    Activity Tolerance Patient tolerated treatment well    Behavior During Therapy WFL for tasks assessed/performed             Past Medical History:  Diagnosis Date   Allergy    Anxiety    Arthritis    hands   Cataract    Diabetes mellitus without complication (HCC)    type 2   Family history of adverse reaction to anesthesia    son has malignant hyperthermia, daughter does not daughter recently had c section without problems   GERD (gastroesophageal reflux disease)    Headache    sinus   Hyperlipidemia    Hypertension    PONV (postoperative nausea and vomiting)    nausea only   Ulcer, stomach peptic yrs ago   Past Surgical History:  Procedure Laterality Date   APPENDECTOMY     both hells bone spur repair     both heels with metal clips   both shoulder rotator cuff repair     CESAREAN SECTION     x 1   COLONOSCOPY WITH PROPOFOL N/A 09/07/2016   Procedure: COLONOSCOPY WITH PROPOFOL;  Surgeon: Charolett Bumpers, MD;  Location: [facility];  Service: Endoscopy;  Laterality: N/A;   colonscopy  06/2011   polyps   EYE SURGERY     FRACTURE SURGERY     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/10/2022   Procedure: REVERSE SHOULDER ARTHROPLASTY;  Surgeon: Francena Hanly, MD;  Location: [facility];  Service: Orthopedics;  Laterality: Right;  Please follow in room 6 if able   TUBAL LIGATION     VESICO-VAGINAL FISTULA REPAIR     Patient Active Problem List   Diagnosis Date Noted   Gastroesophageal reflux disease 11/16/2021   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 08/18/2021    Dermatofibroma 08/18/2021   History of malignant neoplasm of skin 08/18/2021   Lentigo 08/18/2021   Melanocytic nevi of trunk 08/18/2021   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   Rosacea 08/18/2021   Actinic keratosis 08/18/2021   Sensorineural hearing loss (SNHL) of left ear with unrestricted hearing of right ear 07/26/2021   Tinnitus of left ear 07/26/2021   Acute recurrent maxillary sinusitis 06/22/2021   Primary hypertension 01/12/2021   Hyperlipidemia associated with type 2 diabetes mellitus (HCC) 01/12/2021   Arthritis 01/12/2021   Type II diabetes mellitus (HCC) 11/25/2019   Ingrown toenail 09/05/2018   Neck pain 01/29/2018   Tick bite 10/24/2017    PCP: Dayton Scrape, [name]  REFERRING PROVIDER: Rennis Chris, MD  REFERRING DIAG: s/p right reverse TSA  THERAPY DIAG:  Stiffness of right shoulder, not elsewhere classified  Acute pain of right shoulder  Localized edema  Cervicalgia  Rationale for Evaluation and Treatment: Rehabilitation  ONSET DATE: 11/05/22  SUBJECTIVE:  SUBJECTIVE STATEMENT:  She continues to be frustrated I did read the surgical report she brought in and she did have the tendons reattached, but the biggest issue is why the increase of pain now as compared to previously PAIN:  Are you having pain? Yes: NPRS scale: 6/10 Pain location: right shoulder, HA Pain description: dull ache at rest, sharp with motions Aggravating factors: quick motions, movements pain up to 10/10 Relieving factors: ice, rest, Tylenol at best 3/10  PRECAUTIONS: Shoulder Protocol in the chart  RED FLAGS: None   WEIGHT BEARING RESTRICTIONS: No  FALLS:  Has patient fallen in last 6 months? Yes. Number of falls 1  LIVING ENVIRONMENT: Lives with: lives alone Lives in: House/apartment Stairs:  No Has following equipment at home: None  OCCUPATION: retired  PLOF: Independent  PATIENT GOALS:dress without difficulty, do hair, have good ROM and less pain  NEXT MD VISIT:   OBJECTIVE:   DIAGNOSTIC FINDINGS:  See above  PATIENT SURVEYS:  FOTO 21  COGNITION: Overall cognitive status: Within functional limits for tasks assessed     SENSATION: WFL  POSTURE: Fwd head, rounded shoulders, elevated and guarded shoulder  UPPER EXTREMITY ROM:   Active ROM Right PROM eval Right PROM Supine 01/05/23 PROM  01/26/23 PROM 02/09/23 AROM sitting 02/14/23 AROM 02/21/23 AROM  03/02/23 AROM  03/22/23 AAROM 03/28/23 AROM Standing 04/13/23 AROM Standing 05/02/23 AROM  Standing 05/11/23 AROM  Standing 05/25/23 AROM Standing 06/01/23 AROM Standing  07/06/23  Shoulder flexion 35  118  123 90 96 95 100 111 110 112 120 125  129 132  Shoulder extension 5                 Shoulder abduction 20  90 92  73 80 83 90 93 90 92 98 100 102  105  Shoulder adduction                  Shoulder internal rotation 15             40 40   Shoulder external rotation 20  45 30  41  45 50 53 56 56 60 66 67 70  Elbow flexion 120                 Elbow extension 5                 Wrist flexion                  Wrist extension                  Wrist ulnar deviation                  Wrist radial deviation                  Wrist pronation                  Wrist supination                  (Blank rows = not tested)  UPPER EXTREMITY MMT:  No tested due to recent surgery  MMT Right eval Left eval  Shoulder flexion    Shoulder extension    Shoulder abduction    Shoulder adduction    Shoulder internal rotation    Shoulder external rotation    Middle trapezius    Lower trapezius    Elbow flexion    Elbow extension    Wrist flexion  Wrist extension    Wrist ulnar deviation    Wrist radial deviation    Wrist pronation    Wrist supination    Grip strength (lbs)    (Blank rows = not  tested)  PALPATION:  Very tight and tender in the pectoral, upper trap, the entire right upper arm   TODAY'S TREATMENT:                                                                                                                                         DATE:  07/18/23 Passive stretch right shoulder Gentle joint mobs  UE neural tension stretches STM to the right pectoral, right biceps, deltoid area Wall slides Wall circles Doorway stretch  07/13/23 [place] to the right biceps and lateral right upper arm/shoulder STM to the above Passive stretch all motions of the right shoulder to her tolerance, tried some contract relax  Added to HEP below and performed  07/11/23 UBE level 3 x 4 minutes 15# lats 2x10 5# chest press Doorway stretch STM to the right upper trap, neck, pectoral area and the right upper arm Passive stretch of the right upper arm all motions  07/06/23 Push pull with Nustep and then cross body push and pull Passive stretch to the right UE all motions Right UE STM [place] to the right upper arm 100% Supine 3# punches 3# isometric circles 2# ER/IR 2# flexion Vaso medium pressure 34 degrees  07/03/23 Nustep level 5 x 5 minutes Doorway stretch Star gazer stretch Cross body stretch STM to the right bicep, deltoid, pectoral and upper trap and rhomboid, PROM all right shoulder motions Vaso medium pressure 35 degrees  06/29/23 UBE level 4 x 4 minutes Doorway stretch 3 ways Supine Passive stretch all motions 3# punches, flexion, serratus and isometric circles STM to the right upper arm, right trap and pectoral area Vaso medium pressure 34 degrees  06/27/23 UBE level 4 x 5 minutes Supine 2# and 3# serratus, isometric circles, flexion, ER/IR punches STM to the right biceps, deltoid, pectoral area, very tight and tender in the pectorals Passive stretch of the right shoulder all motions except IR, some prolonged star gazer stretch Vaso medium pressure 34  degrees  06/22/23 UBE level 4 x 6 minutes 5# chest press 2# wate bar chest press, flexion to shoulder and behind back PROM of the right shoulder all motions STM to the right deltoid and bicep area Vaso right shoulder 34 degrees medium pressure  06/13/23 UBE level 4 x 6 minutes Wall slides Stick exercises with some passive stretch Supine 2# and 3# exercises to increase strength and function Supine passive stretch of the right shoulder all motions to her tolerance some stretch of the pectoral, and cross body stretch with star gazer stretch STM to the right upper arm and the right upper trap, neck and rhomboid, a lot of trigger points in the rhomboid and  upper trap that referred pain to the head and down the right arm Vaso medium pressure 34 degrees  06/08/23 UBE level 4 x 6 minutes chest press 5# Lats 15# Wall slides Supine PROM/joint mobs STM to the neck upper trap, rhomboid and upper arm Vaso medium pressure 35 degrees  PATIENT EDUCATION: Education details: poc/hep Person educated: Patient Education method: Programmer, multimedia, Facilities manager, Actor cues, Verbal cues, and Handouts Education comprehension: verbalized understanding  HOME EXERCISE PROGRAM: Access Code: [email or web address] URL: [email or web address] Date: 12/20/2022 Prepared by: Stacie Glaze  Exercises - Seated Shoulder Flexion Towel Slide at Table Top  - 2 x daily - 7 x weekly - 2 sets - 10 reps - 3 hold - Seated Shoulder External Rotation PROM on Table  - 2 x daily - 7 x weekly - 2 sets - 10 reps - 3 hold - Seated Elbow Extension and Shoulder External Rotation AAROM at Table with Towel  - 2 x daily - 7 x weekly - 2 sets - 10 reps - 3 hold - Circular Shoulder Pendulum with Table Support  - 2 x daily - 7 x weekly - 2 sets - 10 reps - 3 hold  Access Code: [email or web address] URL: [email or web address] Date: 07/13/2023 Prepared by: Stacie Glaze  Exercises - Isometric Shoulder Flexion at Wall  - 1 x  daily - 7 x weekly - 1 sets - 10 reps - 3 hold - Standing Isometric Shoulder Internal Rotation at Doorway  - 1 x daily - 7 x weekly - 1 sets - 10 reps - 3 hold - Isometric Shoulder Extension at Wall  - 1 x daily - 7 x weekly - 1 sets - 10 reps - 3 hold - Isometric Shoulder Abduction at Wall  - 1 x daily - 7 x weekly - 1 sets - 10 reps - 3 hold - Standing Isometric Shoulder External Rotation with Doorway  - 1 x daily - 7 x weekly - 1 sets - 10 reps - 3 hold  ASSESSMENT:  CLINICAL IMPRESSION: Patient frustrated with her pain levels, she wanted to go over the HEP today and we did that as she had questions regarding the isometrics given last week,  I continued to focus on gentle passive stretch. OBJECTIVE IMPAIRMENTS: cardiopulmonary status limiting activity, decreased activity tolerance, decreased endurance, decreased ROM, decreased strength, increased edema, increased muscle spasms, impaired flexibility, impaired UE functional use, improper body mechanics, postural dysfunction, and pain .  REHAB POTENTIAL: Good  CLINICAL DECISION MAKING: Evolving/moderate complexity  EVALUATION COMPLEXITY: Low   GOALS: Goals reviewed with patient? Yes  SHORT TERM GOALS: Target date: 01/01/23  Independent with initial HEP Goal status: 12/27/22 MET  LONG TERM GOALS: Target date: 03/22/23  Decrease pain 50% Goal status: 03/22/23 progressing  2.  Dress without difficulty Goal status: progressing 06/22/23  3.  Do hair without difficulty Goal status: able to reach the top of head at times progressing 06/22/23  4.  Increase AROM right shoulder flexion to 130 degrees Goal status: 06/01/23 progressing 120 degrees  5.  Increase right shoulder ER to 60 degrees Goal status: met 07/06/23  6.  Return to water aerobics and or gym activity Goal status:progressing met 06/01/23  PLAN:  PT FREQUENCY: 1-2x/week  PT DURATION: 12 weeks  PLANNED INTERVENTIONS: Therapeutic exercises, Therapeutic activity,  Neuromuscular re-education, Balance training, Gait training, Patient/Family education, Self Care, Joint mobilization, Dry Needling, Electrical stimulation, Cryotherapy, Vasopneumatic device, and Manual therapy  PLAN FOR NEXT SESSION:   continue to work on the pain and movement  Jearld Lesch, PT 07/18/2023, 10:07 AM [facility] [address] [facility]. [place], [place], [address] Phone: [phone]   Fax:  [phone]

## 2023-07-19 ENCOUNTER — Ambulatory Visit: Payer: Medicare Other | Admitting: Occupational Therapy

## 2023-07-20 ENCOUNTER — Encounter: Payer: Self-pay | Admitting: Physical Therapy

## 2023-07-20 ENCOUNTER — Ambulatory Visit: Payer: Medicare Other | Admitting: Physical Therapy

## 2023-07-20 DIAGNOSIS — M25642 Stiffness of left hand, not elsewhere classified: Secondary | ICD-10-CM | POA: Diagnosis not present

## 2023-07-20 DIAGNOSIS — M542 Cervicalgia: Secondary | ICD-10-CM | POA: Diagnosis not present

## 2023-07-20 DIAGNOSIS — R600 Localized edema: Secondary | ICD-10-CM

## 2023-07-20 DIAGNOSIS — R519 Headache, unspecified: Secondary | ICD-10-CM | POA: Diagnosis not present

## 2023-07-20 DIAGNOSIS — M25511 Pain in right shoulder: Secondary | ICD-10-CM | POA: Diagnosis not present

## 2023-07-20 DIAGNOSIS — G8929 Other chronic pain: Secondary | ICD-10-CM | POA: Diagnosis not present

## 2023-07-20 DIAGNOSIS — M6281 Muscle weakness (generalized): Secondary | ICD-10-CM

## 2023-07-20 DIAGNOSIS — M79641 Pain in right hand: Secondary | ICD-10-CM | POA: Diagnosis not present

## 2023-07-20 DIAGNOSIS — M79642 Pain in left hand: Secondary | ICD-10-CM | POA: Diagnosis not present

## 2023-07-20 DIAGNOSIS — M25611 Stiffness of right shoulder, not elsewhere classified: Secondary | ICD-10-CM | POA: Diagnosis not present

## 2023-07-20 DIAGNOSIS — M25641 Stiffness of right hand, not elsewhere classified: Secondary | ICD-10-CM | POA: Diagnosis not present

## 2023-07-20 DIAGNOSIS — R278 Other lack of coordination: Secondary | ICD-10-CM | POA: Diagnosis not present

## 2023-07-20 NOTE — Therapy (Signed)
 OUTPATIENT PHYSICAL THERAPY SHOULDER    Patient Name: Veronica Galvan MRN: [id number] DOB:1955-05-21, 68 y.o., female Today's Date: 07/20/2023  END OF SESSION:  PT End of Session - 07/20/23 1533     Visit Number 59    Date for PT Re-Evaluation 08/06/23    Authorization Type [employer] [place]    PT Start Time 1530    PT Stop Time 1619    PT Time Calculation (min) 49 min    Activity Tolerance Patient tolerated treatment well    Behavior During Therapy WFL for tasks assessed/performed             Past Medical History:  Diagnosis Date   Allergy    Anxiety    Arthritis    hands   Cataract    Diabetes mellitus without complication (HCC)    type 2   Family history of adverse reaction to anesthesia    son has malignant hyperthermia, daughter does not daughter recently had c section without problems   GERD (gastroesophageal reflux disease)    Headache    sinus   Hyperlipidemia    Hypertension    PONV (postoperative nausea and vomiting)    nausea only   Ulcer, stomach peptic yrs ago   Past Surgical History:  Procedure Laterality Date   APPENDECTOMY     both hells bone spur repair     both heels with metal clips   both shoulder rotator cuff repair     CESAREAN SECTION     x 1   COLONOSCOPY WITH PROPOFOL N/A 09/07/2016   Procedure: COLONOSCOPY WITH PROPOFOL;  Surgeon: Charolett Bumpers, MD;  Location: [facility];  Service: Endoscopy;  Laterality: N/A;   colonscopy  06/2011   polyps   EYE SURGERY     FRACTURE SURGERY     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/10/2022   Procedure: REVERSE SHOULDER ARTHROPLASTY;  Surgeon: Francena Hanly, MD;  Location: [facility];  Service: Orthopedics;  Laterality: Right;  Please follow in room 6 if able   TUBAL LIGATION     VESICO-VAGINAL FISTULA REPAIR     Patient Active Problem List   Diagnosis Date Noted   Gastroesophageal reflux disease 11/16/2021   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 08/18/2021    Dermatofibroma 08/18/2021   History of malignant neoplasm of skin 08/18/2021   Lentigo 08/18/2021   Melanocytic nevi of trunk 08/18/2021   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   Rosacea 08/18/2021   Actinic keratosis 08/18/2021   Sensorineural hearing loss (SNHL) of left ear with unrestricted hearing of right ear 07/26/2021   Tinnitus of left ear 07/26/2021   Acute recurrent maxillary sinusitis 06/22/2021   Primary hypertension 01/12/2021   Hyperlipidemia associated with type 2 diabetes mellitus (HCC) 01/12/2021   Arthritis 01/12/2021   Type II diabetes mellitus (HCC) 11/25/2019   Ingrown toenail 09/05/2018   Neck pain 01/29/2018   Tick bite 10/24/2017    PCP: Dayton Scrape, [name]  REFERRING PROVIDER: Rennis Chris, MD  REFERRING DIAG: s/p right reverse TSA  THERAPY DIAG:  Stiffness of right shoulder, not elsewhere classified  Acute pain of right shoulder  Localized edema  Cervicalgia  Muscle weakness (generalized)  Rationale for Evaluation and Treatment: Rehabilitation  ONSET DATE: 11/05/22  SUBJECTIVE:  SUBJECTIVE STATEMENT:  reports that she is going to see her MD on Tuesday regarding MRI for shoulder PAIN:  Are you having pain? Yes: NPRS scale: 6/10 Pain location: right shoulder, HA Pain description: dull ache at rest, sharp with motions Aggravating factors: quick motions, movements pain up to 10/10 Relieving factors: ice, rest, Tylenol at best 3/10  PRECAUTIONS: Shoulder Protocol in the chart  RED FLAGS: None   WEIGHT BEARING RESTRICTIONS: No  FALLS:  Has patient fallen in last 6 months? Yes. Number of falls 1  LIVING ENVIRONMENT: Lives with: lives alone Lives in: House/apartment Stairs: No Has following equipment at home: None  OCCUPATION: retired  PLOF:  Independent  PATIENT GOALS:dress without difficulty, do hair, have good ROM and less pain  NEXT MD VISIT:   OBJECTIVE:   DIAGNOSTIC FINDINGS:  See above  PATIENT SURVEYS:  FOTO 21  COGNITION: Overall cognitive status: Within functional limits for tasks assessed     SENSATION: WFL  POSTURE: Fwd head, rounded shoulders, elevated and guarded shoulder  UPPER EXTREMITY ROM:   Active ROM Right PROM eval Right PROM Supine 01/05/23 PROM  01/26/23 PROM 02/09/23 AROM sitting 02/14/23 AROM 02/21/23 AROM  03/02/23 AROM  03/22/23 AAROM 03/28/23 AROM Standing 04/13/23 AROM Standing 05/02/23 AROM  Standing 05/11/23 AROM  Standing 05/25/23 AROM Standing 06/01/23 AROM Standing  07/06/23  Shoulder flexion 35  118  123 90 96 95 100 111 110 112 120 125  129 132  Shoulder extension 5                 Shoulder abduction 20  90 92  73 80 83 90 93 90 92 98 100 102  105  Shoulder adduction                  Shoulder internal rotation 15             40 40   Shoulder external rotation 20  45 30  41  45 50 53 56 56 60 66 67 70  Elbow flexion 120                 Elbow extension 5                 Wrist flexion                  Wrist extension                  Wrist ulnar deviation                  Wrist radial deviation                  Wrist pronation                  Wrist supination                  (Blank rows = not tested)  UPPER EXTREMITY MMT:  No tested due to recent surgery  MMT Right eval Left eval  Shoulder flexion    Shoulder extension    Shoulder abduction    Shoulder adduction    Shoulder internal rotation    Shoulder external rotation    Middle trapezius    Lower trapezius    Elbow flexion    Elbow extension    Wrist flexion    Wrist extension    Wrist ulnar deviation    Wrist radial deviation    Wrist pronation  Wrist supination    Grip strength (lbs)    (Blank rows = not tested)  PALPATION:  Very tight and tender in the pectoral, upper trap, the entire  right upper arm   TODAY'S TREATMENT:                                                                                                                                         DATE:  07/20/23 UBE level 3 x 4 minutes Ball vs wall Rolling ball up wall Small weighted ball toss 2 ways and then with her doing ER Passive stretch right shoulder STM to the right shoulder and upper arm  07/18/23 Passive stretch right shoulder Gentle joint mobs  UE neural tension stretches STM to the right pectoral, right biceps, deltoid area Wall slides Wall circles Doorway stretch  07/13/23 [place] to the right biceps and lateral right upper arm/shoulder STM to the above Passive stretch all motions of the right shoulder to her tolerance, tried some contract relax  Added to HEP below and performed  07/11/23 UBE level 3 x 4 minutes 15# lats 2x10 5# chest press Doorway stretch STM to the right upper trap, neck, pectoral area and the right upper arm Passive stretch of the right upper arm all motions  07/06/23 Push pull with Nustep and then cross body push and pull Passive stretch to the right UE all motions Right UE STM [place] to the right upper arm 100% Supine 3# punches 3# isometric circles 2# ER/IR 2# flexion Vaso medium pressure 34 degrees  07/03/23 Nustep level 5 x 5 minutes Doorway stretch Star gazer stretch Cross body stretch STM to the right bicep, deltoid, pectoral and upper trap and rhomboid, PROM all right shoulder motions Vaso medium pressure 35 degrees  06/29/23 UBE level 4 x 4 minutes Doorway stretch 3 ways Supine Passive stretch all motions 3# punches, flexion, serratus and isometric circles STM to the right upper arm, right trap and pectoral area Vaso medium pressure 34 degrees  06/27/23 UBE level 4 x 5 minutes Supine 2# and 3# serratus, isometric circles, flexion, ER/IR punches STM to the right biceps, deltoid, pectoral area, very tight and tender in the pectorals Passive stretch  of the right shoulder all motions except IR, some prolonged star gazer stretch Vaso medium pressure 34 degrees  06/22/23 UBE level 4 x 6 minutes 5# chest press 2# wate bar chest press, flexion to shoulder and behind back PROM of the right shoulder all motions STM to the right deltoid and bicep area Vaso right shoulder 34 degrees medium pressure  06/13/23 UBE level 4 x 6 minutes Wall slides Stick exercises with some passive stretch Supine 2# and 3# exercises to increase strength and function Supine passive stretch of the right shoulder all motions to her tolerance some stretch of the pectoral, and cross body stretch with star gazer stretch STM to the right upper arm  and the right upper trap, neck and rhomboid, a lot of trigger points in the rhomboid and upper trap that referred pain to the head and down the right arm Vaso medium pressure 34 degrees  06/08/23 UBE level 4 x 6 minutes chest press 5# Lats 15# Wall slides Supine PROM/joint mobs STM to the neck upper trap, rhomboid and upper arm Vaso medium pressure 35 degrees  PATIENT EDUCATION: Education details: poc/hep Person educated: Patient Education method: Programmer, multimedia, Facilities manager, Actor cues, Verbal cues, and Handouts Education comprehension: verbalized understanding  HOME EXERCISE PROGRAM: Access Code: [email or web address] URL: [email or web address] Date: 12/20/2022 Prepared by: Stacie Glaze  Exercises - Seated Shoulder Flexion Towel Slide at Table Top  - 2 x daily - 7 x weekly - 2 sets - 10 reps - 3 hold - Seated Shoulder External Rotation PROM on Table  - 2 x daily - 7 x weekly - 2 sets - 10 reps - 3 hold - Seated Elbow Extension and Shoulder External Rotation AAROM at Table with Towel  - 2 x daily - 7 x weekly - 2 sets - 10 reps - 3 hold - Circular Shoulder Pendulum with Table Support  - 2 x daily - 7 x weekly - 2 sets - 10 reps - 3 hold  Access Code: [email or web address] URL:  [email or web address] Date: 07/13/2023 Prepared by: Stacie Glaze  Exercises - Isometric Shoulder Flexion at Wall  - 1 x daily - 7 x weekly - 1 sets - 10 reps - 3 hold - Standing Isometric Shoulder Internal Rotation at Doorway  - 1 x daily - 7 x weekly - 1 sets - 10 reps - 3 hold - Isometric Shoulder Extension at Wall  - 1 x daily - 7 x weekly - 1 sets - 10 reps - 3 hold - Isometric Shoulder Abduction at Wall  - 1 x daily - 7 x weekly - 1 sets - 10 reps - 3 hold - Standing Isometric Shoulder External Rotation with Doorway  - 1 x daily - 7 x weekly - 1 sets - 10 reps - 3 hold  ASSESSMENT:  CLINICAL IMPRESSION: Patient frustrated with her pain levels, swe continue to tweak the HEP isometrics to avoid pain, she will be seeing MD next week and will request and MRI OBJECTIVE IMPAIRMENTS: cardiopulmonary status limiting activity, decreased activity tolerance, decreased endurance, decreased ROM, decreased strength, increased edema, increased muscle spasms, impaired flexibility, impaired UE functional use, improper body mechanics, postural dysfunction, and pain .  REHAB POTENTIAL: Good  CLINICAL DECISION MAKING: Evolving/moderate complexity  EVALUATION COMPLEXITY: Low   GOALS: Goals reviewed with patient? Yes  SHORT TERM GOALS: Target date: 01/01/23  Independent with initial HEP Goal status: 12/27/22 MET  LONG TERM GOALS: Target date: 03/22/23  Decrease pain 50% Goal status: 03/22/23 progressing  2.  Dress without difficulty Goal status: progressing 06/22/23  3.  Do hair without difficulty Goal status: able to reach the top of head at times progressing 06/22/23  4.  Increase AROM right shoulder flexion to 130 degrees Goal status: 06/01/23 progressing 120 degrees  5.  Increase right shoulder ER to 60 degrees Goal status: met 07/06/23  6.  Return to water aerobics and or gym activity Goal status:progressing met 06/01/23  PLAN:  PT FREQUENCY: 1-2x/week  PT DURATION:  12 weeks  PLANNED INTERVENTIONS: Therapeutic exercises, Therapeutic activity, Neuromuscular re-education, Balance training, Gait training, Patient/Family education, Self Care, Joint mobilization, Dry Needling, Electrical stimulation, Cryotherapy, Vasopneumatic device, and Manual therapy  PLAN FOR NEXT SESSION:  continue to work on the pain and movement Jearld Lesch, PT 07/20/2023, 3:34 PM [place] [facility] [address] [facility] [place], [place], [address] Phone: [phone]   Fax:  [phone]

## 2023-07-25 ENCOUNTER — Ambulatory Visit: Payer: Medicare Other | Admitting: Physical Therapy

## 2023-07-25 ENCOUNTER — Telehealth (INDEPENDENT_AMBULATORY_CARE_PROVIDER_SITE_OTHER): Admitting: Family

## 2023-07-25 ENCOUNTER — Ambulatory Visit: Payer: Medicare Other | Admitting: Occupational Therapy

## 2023-07-25 ENCOUNTER — Encounter: Payer: Self-pay | Admitting: Physical Therapy

## 2023-07-25 ENCOUNTER — Encounter: Payer: Self-pay | Admitting: Family

## 2023-07-25 VITALS — Ht 61.0 in

## 2023-07-25 DIAGNOSIS — R600 Localized edema: Secondary | ICD-10-CM

## 2023-07-25 DIAGNOSIS — M25641 Stiffness of right hand, not elsewhere classified: Secondary | ICD-10-CM

## 2023-07-25 DIAGNOSIS — M25642 Stiffness of left hand, not elsewhere classified: Secondary | ICD-10-CM | POA: Diagnosis not present

## 2023-07-25 DIAGNOSIS — M25611 Stiffness of right shoulder, not elsewhere classified: Secondary | ICD-10-CM

## 2023-07-25 DIAGNOSIS — M542 Cervicalgia: Secondary | ICD-10-CM | POA: Diagnosis not present

## 2023-07-25 DIAGNOSIS — M79642 Pain in left hand: Secondary | ICD-10-CM | POA: Diagnosis not present

## 2023-07-25 DIAGNOSIS — M6281 Muscle weakness (generalized): Secondary | ICD-10-CM

## 2023-07-25 DIAGNOSIS — M25511 Pain in right shoulder: Secondary | ICD-10-CM

## 2023-07-25 DIAGNOSIS — G8929 Other chronic pain: Secondary | ICD-10-CM

## 2023-07-25 DIAGNOSIS — M79641 Pain in right hand: Secondary | ICD-10-CM | POA: Diagnosis not present

## 2023-07-25 DIAGNOSIS — R278 Other lack of coordination: Secondary | ICD-10-CM

## 2023-07-25 DIAGNOSIS — R519 Headache, unspecified: Secondary | ICD-10-CM | POA: Diagnosis not present

## 2023-07-25 NOTE — Therapy (Signed)
 OUTPATIENT OCCUPATIONAL THERAPY ORTHO Treatment  Patient Name: Veronica Galvan MRN: [id number] DOB:[birth year], 68 y.o., female Today's Date: 07/25/2023  PCP: Olive Bass [name] REFERRING PROVIDER: Olive Bass [name]  END OF SESSION:  OT End of Session - 07/25/23 1322     Visit Number 11    Number of Visits 25    Date for OT Re-Evaluation 08/16/23    Authorization Type [employer] MCR    Authorization - Visit Number 11    Progress Note Due on Visit 20    OT Start Time 1317    OT Stop Time 1357    OT Time Calculation (min) 40 min    Activity Tolerance Patient tolerated treatment well    Behavior During Therapy WFL for tasks assessed/performed                 Past Medical History:  Diagnosis Date   Allergy    Anxiety    Arthritis    hands   Cataract    Diabetes mellitus without complication (HCC)    type 2   Family history of adverse reaction to anesthesia    son has malignant hyperthermia, daughter does not daughter recently had c section without problems   GERD (gastroesophageal reflux disease)    Headache    sinus   Hyperlipidemia    Hypertension    PONV (postoperative nausea and vomiting)    nausea only   Ulcer, stomach peptic yrs ago   Past Surgical History:  Procedure Laterality Date   APPENDECTOMY     both hells bone spur repair     both heels with metal clips   both shoulder rotator cuff repair     CESAREAN SECTION     x 1   COLONOSCOPY WITH PROPOFOL N/A 09/07/2016   Procedure: COLONOSCOPY WITH PROPOFOL;  Surgeon: Charolett Bumpers, MD;  Location: [facility];  Service: Endoscopy;  Laterality: N/A;   colonscopy  06/2011   polyps   EYE SURGERY     FRACTURE SURGERY     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/10/2022   Procedure: REVERSE SHOULDER ARTHROPLASTY;  Surgeon: Francena Hanly, MD;  Location: [facility];  Service: Orthopedics;  Laterality: Right;  Please follow in room 6 if able   TUBAL LIGATION     VESICO-VAGINAL FISTULA  REPAIR     Patient Active Problem List   Diagnosis Date Noted   Gastroesophageal reflux disease 11/16/2021   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 08/18/2021   Dermatofibroma 08/18/2021   History of malignant neoplasm of skin 08/18/2021   Lentigo 08/18/2021   Melanocytic nevi of trunk 08/18/2021   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   Rosacea 08/18/2021   Actinic keratosis 08/18/2021   Sensorineural hearing loss (SNHL) of left ear with unrestricted hearing of right ear 07/26/2021   Tinnitus of left ear 07/26/2021   Acute recurrent maxillary sinusitis 06/22/2021   Primary hypertension 01/12/2021   Hyperlipidemia associated with type 2 diabetes mellitus (HCC) 01/12/2021   Arthritis 01/12/2021   Type II diabetes mellitus (HCC) 11/25/2019   Ingrown toenail 09/05/2018   Neck pain 01/29/2018   Tick bite 10/24/2017    ONSET DATE: 05/19/23  REFERRING DIAG:  Diagnosis  M79.641,M79.642 (ICD-10-CM) - Bilateral hand pain    THERAPY DIAG:  Muscle weakness (generalized)  Stiffness of right hand, not elsewhere classified  Pain in right hand  Stiffness of left hand, not elsewhere classified  Pain in left hand  Other lack of coordination  Rationale for Evaluation and Treatment: Rehabilitation  SUBJECTIVE:   SUBJECTIVE STATEMENT: Pt reports  using her hands a lot Pt accompanied by: self  PERTINENT HISTORY: 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 Yes: NPRS scale: 6/10 Pain location: bilateral hands Pain description: aching, stiff Aggravating factors: gripping  Relieving factors: heat, meds Pt also has shoulder pain 4-5/10 which OT will not address as PT is working with pt. to address.  FALLS: Has  patient fallen in last 6 months? No  LIVING ENVIRONMENT: Lives with: lives alone Lives in: House/apartment Stairs: yes   PLOF: Independent  PATIENT GOALS: improve functional use of hands and learn adapted strategies for ADLs/IADLs.  NEXT MD VISIT: unknown  OBJECTIVE:  Note: Objective measures were completed at Evaluation unless otherwise noted.  HAND DOMINANCE: Right  ADLs: Overall ADLs: unable to grip credit card to remove from ATM Transfers/ambulation related to ADLs: mod I Eating: drops silverware Grooming: drops toothbrush, uses electric toothbrush, holding comb Upper body dressing: adjusting bra Lower body dressing: difficulty pulling up pants  Toileting: hygeine was difficult initally Bathing: mod I, difficult with shaving Tub shower transfers: walk in shower    FUNCTIONAL OUTCOME MEASURES: Quick Dash: 05/30/23- 75%% disability  UPPER EXTREMITY ROM:   RUE wrist flexion/ extension: 70/ 50, LUE wrist flexion/ extension: 70/ 45 Pt demonstrates grossly 50% composite flexion for right and 555 with left.   Active ROM Right eval Left eval  Thumb MCP (0-60) 45 55  Thumb IP (0-80) 25 10  Thumb Radial abd/add (0-55)     Thumb Palmar abd/add (0-45)     Thumb Opposition to Small Finger yes yes   Index MCP (0-90)     Index PIP (0-100)     Index DIP (0-70)      Long MCP (0-90)      Long PIP (0-100)      Long DIP (0-70)      Ring MCP (0-90)      Ring PIP (0-100)      Ring DIP (0-70)      Little MCP (0-90)      Little PIP (0-100)      Little DIP (0-70)      (Blank rows = not tested)   HAND FUNCTION: Grip strength: Right: 8 lbs; Left: 4 lbs,   SENSATION: Light touch: Impaired index finger LUE  EDEMA: Pt with bony defomities at PIP joints of all digits which pt reports are bone spurs, Pt also has bony deformity at DIP for right thumb, index and 5th digit and LUE small and index fingers  COGNITION: Overall cognitive status: Within functional limits for tasks  assessed   OBSERVATIONS: Pleasant female desires to increase functional use of bilateral UE's   TREATMENT DATE: 07/25/23 Pt reports she loves her button hook and it is working well. Paraffin to right hand x 10 mins while pt performed A/ROM  for LUE: blocking exercises followed by P/ROM composite flexion to digits individually and compositely, A/ROM thumb  flexion/ extension and finger thumb opposition..  Paraffin to RUE x 10 mins  while LUE A/ROM blocking exercises followed by P/ROM composite flexion to digits individually and compositely, finger thumb opposition and thumb flexion.  Composite finger flexion after stretching: 3 cm from palmar crease- LUE, 1 3/4  from palmar crease- RUE  07/13/23 Pt used button hook to fasten the buttons on her shirt. Paraffin to left hand x 10 mins while pt perfromed A/ROM  for RUE: blocking exercises followed by P/ROM composite flexion to digits individually and compositely, A/ROM thumb  flexion/ extension and finger thumb opposition..  Paraffin to LUE x 10 mins  while RUE A/ROM blocking exercises followed by P/ROM composite flexion to digits individually and compositely, finger thumb opposition and thumb flexion. Pt reports she received her adpated writing utensils, pen again and they are working well. Pt demonstrates improved bilateral UE flexibility.  07/06/23 Pt was shown buttonhook and she practiced using it with good success. Pt plans to order one. Pt was also shown the Pen again for writing acitivities. pt returned demonstration of use. It places RUE in a better position for writing. Paraffin to right hand x 10 mins while pt perfromed A/ROM  for LUE: blocking exercises followed by P/ROM composite flexion to digits individually and compositely, A/ROM thumb  flexion/ extension.  Paraffin to LUE x 10 mins  while RUE A/ROM blocking exercises followed by P/ROM composite flexion to digits individually and compositely, A/ROM .  Yellow putty was issued for sustained grip  bilateral UE's and tip pinch for LUE, pt returned demonstration grossly 20 reps each min v.c  Wash cloth scrunches and flattening 10 reps min v.c   06/29/23-Paraffin to left hand x 10 mins while pt perfromed A/ROM  for RUE: blocking exercises for each digit, followed by P/ROM composite flexion to digits individually and compositely. Paraffin to RUE x 10 mins  while LUE A/ROM blocking exercises for each digit,  followed by P/ROM composite flexion to digits individually and compositely.  Yellow putty was issued for sustained grip bilateral UE's and tip pinch for LUE, pt returned demonstration grossly 20 reps each min v.c  Wash cloth scrunches and flattening 5-10 reps each hand, min v.c    06/27/23- Paraffin to left hand x 10 mins while pt perfromed A/ROM  for RUE: blocking exercises for each digit, thumb flexion extension, followed by P/ROM composite flexion to digits individually and compositely. Pt demonstrates improved composite finger flexion end of session. Paraffin to RUE x 10 mins  while LUE A/ROM blocking exercises for each digit, thumb flexion extension, followed by P/ROM composite flexion to digits individually and compositely. Pt will improved composite finger flexion end of session.  06/22/23- Pt brought in her splints, the splint for left hand is fitting well. Pt can benefit from a different splint for RUE that places index finger in greater extension. Therapist fabricated an index finger splint to position PIP in extension and to address DIP deformity. Pt reports splint fits well A/ROM PIP blocking to digits of left and right hands, thumb flexion/ extension, opposition, min v.c and min facilitation Pt brought in her AE bottle openers and pt practiced opening plastic bottle. Pt was issued additional foam grips for pens.    06/20/23 Today's therapy session was focused on fabrication of custom circumferential finger splints for bilateral index fingers to improve DIP positioning to minimize risk  for additional deformity. 1 splint was fabicated for right index and 2 for left. Pt's second index finger splint also promotes finger extension at PIP joint.  Pt was instructed in splint wear, care and precautions. Pt. reports AE is helping at home. 06/14/23 paraffin x 10 mins to bilateral UE's Ice pack to Left shoulder x 10 mins for pain relief, no adverse reactions Reviewed PIP, DIP blocking  exercises, finger thumb opposition and thumb flexion for bilateral UE's. Discussed recommendations for adapted strategies for ADLs including use of food processor to shred chicken. Pt reports using food processory for cutting vegetables and it worked well. Pt reports she purchased several items we discussed last visit and overall they are working well. Therapist began assessing splinting needs, oval 8's were tried but did not provide correct positioning, pt will benefit from custom finger splints in the future to improve DIP positioning.    06/06/22- Paraffin x 10 mins to bilateral UE's for pain and stiffness, no adverse reactions Reviewed A/ROM HEP issued last visit 5-10 reps each bilateral UE's. Discussions regarding activity modification and potential AE including opening bottles/ jars, and use of food processor for chopping vegatables instead of chopping with a knife. Pt was shown some options on the internet. Discussed importance of warming up before activity with paraffin or gentle A/ROM   05/30/23- Paraffin x 10 mins to bilateral UE's for pain and stiffness, then pt was instucted in A/ROM HEP. See pt. instructions. Therapist began discussing activity modifications and AE. Pt was provided with a foam grip for increased ease with handwriting.  Pt tried using the sock aide however it was more challenging than donning the sock the traditional way.   05/24/23 eval only                                                                                                                                PATIENT  EDUCATION: Education details:  yellow putty HEP- see pt instructions Person educated: Patient Education method: Explanation, demonstration, v.c , handout Education comprehension: verbalized understanding, returned demonstration  HOME EXERCISE PROGRAM: AROM  GOALS: Goals reviewed with patient? Yes  SHORT TERM GOALS: Target date: 06/24/23  I with HEP  Goal status:  met 06/07/23  2.  I with positioning/ splinting prn to minimize pain and defomity   Goal status: met, 07/13/23  3.  Pt will increase bilateral grip strength by 5 lbs for increased UE functional use. Baseline: 05/30/23- RUE 8 lbs, LUE 4 lbs Goal status:  ongoing, 07/05/23    LONG TERM GOALS: Target date: 08/16/23  I with updated HEP  Goal status: ongoing  2.  Pt will improve Quick Dash score to 70% disability Baseline: 75% (05/30/23) Goal status:  ongoing  3.  Pt will demonstrate at least 65% composte flexion for LUE for increased functional use.  Goal status: ongoing  4.  I with adapted strategies/ adapted equipment to minimize pain and to increase pt I with ADLs/IADLs  Goal status:  ongoing, pt educated in buttook and other adapted devices/ strategies for opening continers.07/13/23  5.  Pt will demonstrate at least 65% composite flexion for RUE for increased functional use.  Goal status:  ongoing    ASSESSMENT:  CLINICAL IMPRESSION: For the reporting period of :05/24/23- 07/13/23 Pt has met 2/3 short term goals. She is progressing towards all remainging goals  and demonstrates improved ROM and flexibility in her hands. Pt can benefit from continued skilled occupational therapy to address the perfromance deficits below and to maximize pt's safety and I with ADLs/IADLs.PERFORMANCE DEFICITS: in functional skills including ADLs, IADLs, coordination, sensation, edema, ROM, strength, pain, flexibility, Fine motor control, Gross motor control, endurance, decreased knowledge of precautions, decreased knowledge of use of  DME, and UE functional use,, and psychosocial skills including coping strategies, environmental adaptation, habits, interpersonal interactions, and routines and behaviors.   IMPAIRMENTS: are limiting patient from ADLs, IADLs, rest and sleep, education, play, leisure, and social participation.   COMORBIDITIES: may have co-morbidities  that affects occupational performance. Patient will benefit from skilled OT to address above impairments and improve overall function.  MODIFICATION OR ASSISTANCE TO COMPLETE EVALUATION: No modification of tasks or assist necessary to complete an evaluation.  OT OCCUPATIONAL PROFILE AND HISTORY: Detailed assessment: Review of records and additional review of physical, cognitive, psychosocial history related to current functional performance.  CLINICAL DECISION MAKING: LOW - limited treatment options, no task modification necessary  REHAB POTENTIAL: Good  EVALUATION COMPLEXITY: Low      PLAN:  OT FREQUENCY: 2x/week plus eval  OT DURATION: 12 weeks  PLANNED INTERVENTIONS: 97168 OT Re-evaluation, 97535 self care/ADL training, 64403 therapeutic exercise, 97530 therapeutic activity, 97112 neuromuscular re-education, 97140 manual therapy, 97113 aquatic therapy, 97035 ultrasound, 97018 paraffin, 47425 fluidotherapy, 97010 moist heat, 97010 cryotherapy, 97034 contrast bath, 97760 Orthotics management and training, 95638 Splinting (initial encounter), [id number] Subsequent splinting/medication, passive range of motion, energy conservation, coping strategies training, patient/family education, and DME and/or AE instructions  RECOMMENDED OTHER SERVICES: none  CONSULTED AND AGREED WITH PLAN OF CARE: Patient  PLAN FOR NEXT SESSION: review putty HEP,  AE,   Teshawn Moan, OT 07/25/2023, 1:33 PM

## 2023-07-25 NOTE — Progress Notes (Signed)
 Veronica Galvan is a 68 y.o. 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   Rosacea 08/18/2021   Actinic keratosis 08/18/2021   Sensorineural hearing loss (SNHL) of left ear with unrestricted hearing of right ear 07/26/2021   Tinnitus of left ear 07/26/2021   Acute recurrent maxillary sinusitis 06/22/2021   Primary hypertension 01/12/2021   Hyperlipidemia associated with type 2 diabetes mellitus (HCC) 01/12/2021   Arthritis 01/12/2021   Type II diabetes mellitus (HCC) 11/25/2019   Ingrown toenail 09/05/2018   Neck pain 01/29/2018   Tick bite 10/24/2017    Current Outpatient Medications  Medication Sig Dispense Refill   acetaminophen (TYLENOL) 650 MG CR tablet Take 1,300 mg by mouth every 8 (eight) hours as needed for pain.     ALPRAZolam (XANAX) 0.5 MG tablet Take 1 tablet (0.5 mg total) by mouth at bedtime as needed for anxiety. 30 tablet 0   Ascorbic Acid (VITAMIN C) 1000 MG tablet Take 1,000 mg by mouth every morning.     aspirin EC 81 MG tablet Take 81 mg by mouth every morning.     atorvastatin (LIPITOR) 40 MG tablet Take 1 tablet (40 mg total) by mouth every evening. 90 tablet 1   Calcium Carb-Cholecalciferol (CALCIUM 600+D3 PO) Take 1 tablet by mouth every morning.     calcium carbonate (TUMS - DOSED IN MG ELEMENTAL CALCIUM) 500 MG chewable tablet Chew 2 tablets by mouth daily as needed for indigestion or heartburn.     carboxymethylcellulose (REFRESH TEARS) 0.5 % SOLN Place 1 drop into both eyes 2 (two) times daily.     celecoxib (CELEBREX) 200 MG capsule TAKE 1 CAPSULE BY MOUTH DAILY 90 capsule 3    Coenzyme Q10 300 MG CAPS Take 1 capsule by mouth every morning.     diclofenac Sodium (VOLTAREN) 1 % GEL Apply 1 Application topically 2 (two) times daily.     fluticasone (FLONASE) 50 MCG/ACT nasal spray Place 2 sprays into both nostrils daily. 16 g 6   glucose blood test strip Use as instructed 100 each 12   levocetirizine (XYZAL) 5 MG tablet Take 1 tablet (5 mg total) by mouth every evening. 90 tablet 1   lisinopril (ZESTRIL) 5 MG tablet Take 1 tablet (5 mg total) by mouth 2 (two) times daily. 180 tablet 1   LUTEIN-ZEAXANTHIN PO Take 1 tablet by mouth every morning.     MAGNESIUM CITRATE PO Take 500 mg by mouth daily.     Multiple Vitamins-Minerals (MULTIVITAMIN WITH MINERALS) tablet Take 1 tablet by mouth every morning.     Omega-3 Fatty Acids (FISH OIL) 1000 MG CAPS Take 2,000 mg by mouth daily.     omeprazole (PRILOSEC) 20 MG capsule Take 1 capsule (20 mg total) by mouth every morning. 90 capsule 1   sodium chloride (MURO 128) 2 % ophthalmic solution Place 1 drop into both eyes 2 (two) times daily.     SYNJARDY XR 12.08-998 MG TB24 Take 1 tablet by mouth 2 (two) times daily. 180 tablet 3   TURMERIC PO Take 2,000 mg by mouth daily.     UNABLE TO FIND Take 1 tablet by mouth daily.  Cinsulin supplement     vitamin E 180 MG (400 UNITS) capsule Take 400 Units by mouth daily.     No current facility-administered medications for this visit.    Allergies: Codeine, Benzonatate, and Epinephrine  Past Medical History:  Diagnosis Date   Allergy    Anxiety    Arthritis    hands   Cataract    Diabetes mellitus without complication (HCC)    type 2   Family history of adverse reaction to anesthesia    son has malignant hyperthermia, daughter does not daughter recently had c section without problems   GERD (gastroesophageal reflux disease)    Headache    sinus   Hyperlipidemia    Hypertension    PONV (postoperative nausea and vomiting)    nausea only   Ulcer, stomach peptic yrs ago     Past Surgical History:  Procedure Laterality Date   APPENDECTOMY     both hells bone spur repair     both heels with metal clips   both shoulder rotator cuff repair     CESAREAN SECTION     x 1   COLONOSCOPY WITH PROPOFOL N/A 09/07/2016   Procedure: COLONOSCOPY WITH PROPOFOL;  Surgeon: Charolett Bumpers, MD;  Location: [facility];  Service: Endoscopy;  Laterality: N/A;   colonscopy  06/2011   polyps   EYE SURGERY     FRACTURE SURGERY     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/10/2022   Procedure: REVERSE SHOULDER ARTHROPLASTY;  Surgeon: Francena Hanly, MD;  Location: [facility];  Service: Orthopedics;  Laterality: Right;  Please follow in room 6 if able   TUBAL LIGATION     VESICO-VAGINAL FISTULA REPAIR      Family History  Problem Relation Age of Onset   Hypertension Mother    COPD Mother    Cancer Mother    Arthritis Mother    Hypertension Father    Hyperlipidemia Father    Diabetes Father    Cancer Father    Alcohol abuse Father    Diabetes Brother    Alcohol abuse Brother    Hypertension Brother    Arthritis Maternal Grandmother    Birth defects Maternal Grandmother    Diabetes Paternal Grandmother    Heart disease Paternal Grandfather    Diabetes Paternal Aunt    Diabetes Paternal Aunt    Obesity Son     Social History   Tobacco Use   Smoking status: Former    Current packs/day: 1.00    Average packs/day: 1 pack/day for 14.0 years (14.0 ttl pk-yrs)    Types: Cigarettes   Smokeless tobacco: Never   Tobacco comments:    quit 31 yrs ago  Substance Use Topics   Alcohol use: Yes    Comment: rare    Subjective:    I connected with Veronica Galvan on 07/25/23 at 11:00 AM EDT by a video enabled telemedicine application and verified that I am speaking with the correct person using two identifiers.   I discussed the limitations of evaluation and management by telemedicine and the availability of in person appointments. The patient expressed understanding  and agreed to proceed. Provider in office/ patient is at home; provider and patient are only 2 people on video call.   Asking for guidance on how to manage worsening shoulder pain; was told by her surgeon at end of January that her shoulder was doing well and was essentially cleared but she actually feels like symptoms are noticeably worsening;  she is wondering if our office could get MRI approved for her or what most appropriate plan of action would be;   Objective:  Vitals:   07/25/23 1103  Height: 5\' 1"  (1.549 m)    General: Well developed, well nourished, in no acute distress  Skin : Warm and dry.  Head: Normocephalic and atraumatic  Lungs: Respirations unlabored;  Neurologic: Alert and oriented; speech intact; face symmetrical; moves all extremities well; CNII-XII intact without focal deficit   Assessment:  1. Chronic right shoulder pain     Plan:  Patient is having worsening of symptoms as opposed to continuing improvement as her surgeon thought would happen; she is encouraged to reach out there first to discuss symptoms and let that office determine which type of imaging/ intervention is most appropriate. She agrees and will call back with further questions/ concerns.   No follow-ups on file.  No orders of the defined types were placed in this encounter.   Requested Prescriptions    No prescriptions requested or ordered in this encounter

## 2023-07-25 NOTE — Therapy (Signed)
 OUTPATIENT PHYSICAL THERAPY SHOULDER  Progress Note Reporting Period 06/22/23 to 07/25/23 for visits 51-60  See note below for Objective Data and Assessment of Progress/Goals.      Patient Name: Veronica Galvan MRN: [id number] DOB:11-20-1955, 69 y.o., female Today's Date: 07/25/2023  END OF SESSION:  PT End of Session - 07/25/23 0758     Visit Number 60    Date for PT Re-Evaluation 08/06/23    Authorization Type [employer] [place]    PT Start Time [phone]    PT Stop Time 0845    PT Time Calculation (min) 47 min    Activity Tolerance Patient tolerated treatment well    Behavior During Therapy WFL for tasks assessed/performed             Past Medical History:  Diagnosis Date   Allergy    Anxiety    Arthritis    hands   Cataract    Diabetes mellitus without complication (HCC)    type 2   Family history of adverse reaction to anesthesia    son has malignant hyperthermia, daughter does not daughter recently had c section without problems   GERD (gastroesophageal reflux disease)    Headache    sinus   Hyperlipidemia    Hypertension    PONV (postoperative nausea and vomiting)    nausea only   Ulcer, stomach peptic yrs ago   Past Surgical History:  Procedure Laterality Date   APPENDECTOMY     both hells bone spur repair     both heels with metal clips   both shoulder rotator cuff repair     CESAREAN SECTION     x 1   COLONOSCOPY WITH PROPOFOL N/A 09/07/2016   Procedure: COLONOSCOPY WITH PROPOFOL;  Surgeon: Charolett Bumpers, MD;  Location: [facility];  Service: Endoscopy;  Laterality: N/A;   colonscopy  06/2011   polyps   EYE SURGERY     FRACTURE SURGERY     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/10/2022   Procedure: REVERSE SHOULDER ARTHROPLASTY;  Surgeon: Francena Hanly, MD;  Location: [facility];  Service: Orthopedics;  Laterality: Right;  Please follow in room 6 if able   TUBAL LIGATION     VESICO-VAGINAL FISTULA REPAIR     Patient Active Problem List    Diagnosis Date Noted   Gastroesophageal reflux disease 11/16/2021   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 08/18/2021   Dermatofibroma 08/18/2021   History of malignant neoplasm of skin 08/18/2021   Lentigo 08/18/2021   Melanocytic nevi of trunk 08/18/2021   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   Rosacea 08/18/2021   Actinic keratosis 08/18/2021   Sensorineural hearing loss (SNHL) of left ear with unrestricted hearing of right ear 07/26/2021   Tinnitus of left ear 07/26/2021   Acute recurrent maxillary sinusitis 06/22/2021   Primary hypertension 01/12/2021   Hyperlipidemia associated with type 2 diabetes mellitus (HCC) 01/12/2021   Arthritis 01/12/2021   Type II diabetes mellitus (HCC) 11/25/2019   Ingrown toenail 09/05/2018   Neck pain 01/29/2018   Tick bite 10/24/2017    PCP: Dayton Scrape, [name]  REFERRING PROVIDER: Rennis Chris, MD  REFERRING DIAG: s/p right reverse TSA  THERAPY DIAG:  Stiffness of right shoulder, not elsewhere classified  Acute pain of right shoulder  Cervicalgia  Localized edema  Muscle weakness (generalized)  Rationale for Evaluation and Treatment: Rehabilitation  ONSET DATE: 11/05/22  SUBJECTIVE:  SUBJECTIVE STATEMENT:  rSeeing MD today, wanted to discuss questions to ask her PAIN:  Are you having pain? Yes: NPRS scale: 6/10 Pain location: right shoulder, HA Pain description: dull ache at rest, sharp with motions Aggravating factors: quick motions, movements pain up to 10/10 Relieving factors: ice, rest, Tylenol at best 3/10  PRECAUTIONS: Shoulder Protocol in the chart  RED FLAGS: None   WEIGHT BEARING RESTRICTIONS: No  FALLS:  Has patient fallen in last 6 months? Yes. Number of falls 1  LIVING ENVIRONMENT: Lives with: lives  alone Lives in: House/apartment Stairs: No Has following equipment at home: None  OCCUPATION: retired  PLOF: Independent  PATIENT GOALS:dress without difficulty, do hair, have good ROM and less pain  NEXT MD VISIT:   OBJECTIVE:   DIAGNOSTIC FINDINGS:  See above  PATIENT SURVEYS:  FOTO 21  COGNITION: Overall cognitive status: Within functional limits for tasks assessed     SENSATION: WFL  POSTURE: Fwd head, rounded shoulders, elevated and guarded shoulder  UPPER EXTREMITY ROM:   Active ROM Right PROM eval Right PROM Supine 01/05/23 PROM  01/26/23 PROM 02/09/23 AROM sitting 02/14/23 AROM 02/21/23 AROM  03/02/23 AROM  03/22/23 AAROM 03/28/23 AROM Standing 04/13/23 AROM Standing 05/02/23 AROM  Standing 05/11/23 AROM  Standing 05/25/23 AROM Standing 06/01/23 AROM Standing  07/06/23  Shoulder flexion 35  118  123 90 96 95 100 111 110 112 120 125  129 132  Shoulder extension 5                 Shoulder abduction 20  90 92  73 80 83 90 93 90 92 98 100 102  105  Shoulder adduction                  Shoulder internal rotation 15             40 40   Shoulder external rotation 20  45 30  41  45 50 53 56 56 60 66 67 70  Elbow flexion 120                 Elbow extension 5                 Wrist flexion                  Wrist extension                  Wrist ulnar deviation                  Wrist radial deviation                  Wrist pronation                  Wrist supination                  (Blank rows = not tested)  UPPER EXTREMITY MMT:  No tested due to recent surgery  MMT Right eval Left eval  Shoulder flexion    Shoulder extension    Shoulder abduction    Shoulder adduction    Shoulder internal rotation    Shoulder external rotation    Middle trapezius    Lower trapezius    Elbow flexion    Elbow extension    Wrist flexion    Wrist extension    Wrist ulnar deviation    Wrist radial deviation    Wrist pronation    Wrist supination  Grip strength  (lbs)    (Blank rows = not tested)  PALPATION:  Very tight and tender in the pectoral, upper trap, the entire right upper arm   TODAY'S TREATMENT:                                                                                                                                         DATE:  07/25/23 UBE level 3 x 4 minutes for mm perfusion Lats 15# too painful Seated row 10# Doorway stretch PROM right shoulder all motions to tolerance STM to the tight upper arm and the right pectoral area  07/20/23 UBE level 3 x 4 minutes Ball vs wall Rolling ball up wall Small weighted ball toss 2 ways and then with her doing ER Passive stretch right shoulder STM to the right shoulder and upper arm  07/18/23 Passive stretch right shoulder Gentle joint mobs  UE neural tension stretches STM to the right pectoral, right biceps, deltoid area Wall slides Wall circles Doorway stretch  07/13/23 [place] to the right biceps and lateral right upper arm/shoulder STM to the above Passive stretch all motions of the right shoulder to her tolerance, tried some contract relax  Added to HEP below and performed  07/11/23 UBE level 3 x 4 minutes 15# lats 2x10 5# chest press Doorway stretch STM to the right upper trap, neck, pectoral area and the right upper arm Passive stretch of the right upper arm all motions  07/06/23 Push pull with Nustep and then cross body push and pull Passive stretch to the right UE all motions Right UE STM [place] to the right upper arm 100% Supine 3# punches 3# isometric circles 2# ER/IR 2# flexion Vaso medium pressure 34 degrees  07/03/23 Nustep level 5 x 5 minutes Doorway stretch Star gazer stretch Cross body stretch STM to the right bicep, deltoid, pectoral and upper trap and rhomboid, PROM all right shoulder motions Vaso medium pressure 35 degrees  06/29/23 UBE level 4 x 4 minutes Doorway stretch 3 ways Supine Passive stretch all motions 3# punches, flexion, serratus  and isometric circles STM to the right upper arm, right trap and pectoral area Vaso medium pressure 34 degrees   PATIENT EDUCATION: Education details: poc/hep Person educated: Patient Education method: Programmer, multimedia, Facilities manager, Actor cues, Verbal cues, and Handouts Education comprehension: verbalized understanding  HOME EXERCISE PROGRAM: Access Code: [email or web address] URL: [email or web address] Date: 12/20/2022 Prepared by: Stacie Glaze  Exercises - Seated Shoulder Flexion Towel Slide at Table Top  - 2 x daily - 7 x weekly - 2 sets - 10 reps - 3 hold - Seated Shoulder External Rotation PROM on Table  - 2 x daily - 7 x weekly - 2 sets - 10 reps - 3 hold - Seated Elbow Extension and Shoulder External Rotation AAROM at Table with Towel  - 2 x daily - 7 x weekly - 2 sets - 10  reps - 3 hold - Circular Shoulder Pendulum with Table Support  - 2 x daily - 7 x weekly - 2 sets - 10 reps - 3 hold  Access Code: [email or web address] URL: [email or web address] Date: 07/13/2023 Prepared by: Stacie Glaze  Exercises - Isometric Shoulder Flexion at Wall  - 1 x daily - 7 x weekly - 1 sets - 10 reps - 3 hold - Standing Isometric Shoulder Internal Rotation at Doorway  - 1 x daily - 7 x weekly - 1 sets - 10 reps - 3 hold - Isometric Shoulder Extension at Wall  - 1 x daily - 7 x weekly - 1 sets - 10 reps - 3 hold - Isometric Shoulder Abduction at Wall  - 1 x daily - 7 x weekly - 1 sets - 10 reps - 3 hold - Standing Isometric Shoulder External Rotation with Doorway  - 1 x daily - 7 x weekly - 1 sets - 10 reps - 3 hold  ASSESSMENT:  CLINICAL IMPRESSION: Patient frustrated with her pain levels, she will be seeing MD today and will request and MRI.  Issues for [place] is prior to Thanksgiving she was doing well with minimal pain.  Since that time her ROM has definitely improved to the point where a normal reverse total shoulder would be, the issue however is her pain levels in the right  lateral and anterior upper arm have increased, she was very, very active from Thanksgiving until [place].  She also had an incident where a massage therapist used IASTM on the right lateral upper arm, the arm still feels warm.  She is also reporting left shoulder pain since the right arm hurts so much so she is probably over using the left arm now.  I have changed to isometrics, PROM and STM to try to help the ROM but overall help with pain levels OBJECTIVE IMPAIRMENTS: cardiopulmonary status limiting activity, decreased activity tolerance, decreased endurance, decreased ROM, decreased strength, increased edema, increased muscle spasms, impaired flexibility, impaired UE functional use, improper body mechanics, postural dysfunction, and pain .  REHAB POTENTIAL: Good  CLINICAL DECISION MAKING: Evolving/moderate complexity  EVALUATION COMPLEXITY: Low   GOALS: Goals reviewed with patient? Yes  SHORT TERM GOALS: Target date: 01/01/23  Independent with initial HEP Goal status: 12/27/22 MET  LONG TERM GOALS: Target date: 03/22/23  Decrease pain 50% Goal status: progressing 07/25/23  2.  Dress without difficulty Goal status: progressing 07/25/23  3.  Do hair without difficulty Goal status: able to reach the top of head at times progressing 2/20/253/25/25 4.  Increase AROM right shoulder flexion to 130 degrees Goal status: 07/25/23 progressing 130 degrees with pain  5.  Increase right shoulder ER to 60 degrees Goal status: met 07/06/23  6.  Return to water aerobics and or gym activity Goal status:progressing met 06/01/23  PLAN:  PT FREQUENCY: 1-2x/week  PT DURATION: 12 weeks  PLANNED INTERVENTIONS: Therapeutic exercises, Therapeutic activity, Neuromuscular re-education, Balance training, Gait training, Patient/Family education, Self Care, Joint mobilization, Dry Needling, Electrical stimulation, Cryotherapy, Vasopneumatic device, and Manual therapy  PLAN FOR NEXT SESSION:   see what MD  thinks Jearld Lesch, PT 07/25/2023, 7:58 AM [facility] [place], [place], [address] Phone: [phone]   Fax:  [phone]

## 2023-07-27 ENCOUNTER — Ambulatory Visit: Payer: Medicare Other | Admitting: Occupational Therapy

## 2023-07-27 ENCOUNTER — Ambulatory Visit: Payer: Medicare Other | Admitting: Physical Therapy

## 2023-07-27 ENCOUNTER — Encounter: Payer: Self-pay | Admitting: Physical Therapy

## 2023-07-27 DIAGNOSIS — G8929 Other chronic pain: Secondary | ICD-10-CM | POA: Diagnosis not present

## 2023-07-27 DIAGNOSIS — M25611 Stiffness of right shoulder, not elsewhere classified: Secondary | ICD-10-CM | POA: Diagnosis not present

## 2023-07-27 DIAGNOSIS — M542 Cervicalgia: Secondary | ICD-10-CM | POA: Diagnosis not present

## 2023-07-27 DIAGNOSIS — M6281 Muscle weakness (generalized): Secondary | ICD-10-CM | POA: Diagnosis not present

## 2023-07-27 DIAGNOSIS — M79641 Pain in right hand: Secondary | ICD-10-CM | POA: Diagnosis not present

## 2023-07-27 DIAGNOSIS — M79642 Pain in left hand: Secondary | ICD-10-CM

## 2023-07-27 DIAGNOSIS — R519 Headache, unspecified: Secondary | ICD-10-CM | POA: Diagnosis not present

## 2023-07-27 DIAGNOSIS — M25511 Pain in right shoulder: Secondary | ICD-10-CM

## 2023-07-27 DIAGNOSIS — M25641 Stiffness of right hand, not elsewhere classified: Secondary | ICD-10-CM | POA: Diagnosis not present

## 2023-07-27 DIAGNOSIS — R600 Localized edema: Secondary | ICD-10-CM | POA: Diagnosis not present

## 2023-07-27 DIAGNOSIS — R278 Other lack of coordination: Secondary | ICD-10-CM

## 2023-07-27 DIAGNOSIS — M25642 Stiffness of left hand, not elsewhere classified: Secondary | ICD-10-CM | POA: Diagnosis not present

## 2023-07-27 NOTE — Therapy (Unsigned)
 OUTPATIENT OCCUPATIONAL THERAPY ORTHO Treatment  Patient Name: Veronica Galvan MRN: [id number] DOB:Aug 20, 1955, 68 y.o., female Today's Date: 07/28/2023  PCP: Olive Bass [name] REFERRING PROVIDER: Olive Bass [name]  END OF SESSION:  OT End of Session - 07/28/23 1016     Visit Number 12    Number of Visits 25    Date for OT Re-Evaluation 08/16/23    Authorization Type [employer] M[employer]    Authorization - Visit Number 12    Progress Note Due on Visit 20    OT Start Time 0845    OT Stop Time 0930    OT Time Calculation (min) 45 min                  Past Medical History:  Diagnosis Date   Allergy    Anxiety    Arthritis    hands   Cataract    Diabetes mellitus without complication (HCC)    type 2   Family history of adverse reaction to anesthesia    son has malignant hyperthermia, daughter does not daughter recently had c section without problems   GERD (gastroesophageal reflux disease)    Headache    sinus   Hyperlipidemia    Hypertension    PONV (postoperative nausea and vomiting)    nausea only   Ulcer, stomach peptic yrs ago   Past Surgical History:  Procedure Laterality Date   APPENDECTOMY     both hells bone spur repair     both heels with metal clips   both shoulder rotator cuff repair     CESAREAN SECTION     x 1   COLONOSCOPY WITH PROPOFOL N/A 09/07/2016   Procedure: COLONOSCOPY WITH PROPOFOL;  Surgeon: Charolett Bumpers, MD;  Location: [facility];  Service: Endoscopy;  Laterality: N/A;   colonscopy  06/2011   polyps   EYE SURGERY     FRACTURE SURGERY     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/10/2022   Procedure: REVERSE SHOULDER ARTHROPLASTY;  Surgeon: Francena Hanly, MD;  Location: [facility];  Service: Orthopedics;  Laterality: Right;  Please follow in room 6 if able   TUBAL LIGATION     VESICO-VAGINAL FISTULA REPAIR     Patient Active Problem List   Diagnosis Date Noted   Gastroesophageal reflux disease 11/16/2021    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 08/18/2021   Dermatofibroma 08/18/2021   History of malignant neoplasm of skin 08/18/2021   Lentigo 08/18/2021   Melanocytic nevi of trunk 08/18/2021   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   Rosacea 08/18/2021   Actinic keratosis 08/18/2021   Sensorineural hearing loss (SNHL) of left ear with unrestricted hearing of right ear 07/26/2021   Tinnitus of left ear 07/26/2021   Acute recurrent maxillary sinusitis 06/22/2021   Primary hypertension 01/12/2021   Hyperlipidemia associated with type 2 diabetes mellitus (HCC) 01/12/2021   Arthritis 01/12/2021   Type II diabetes mellitus (HCC) 11/25/2019   Ingrown toenail 09/05/2018   Neck pain 01/29/2018   Tick bite 10/24/2017    ONSET DATE: 05/19/23  REFERRING DIAG:  Diagnosis  M79.641,M79.642 (ICD-10-CM) - Bilateral hand pain    THERAPY DIAG:  Muscle weakness (generalized)  Stiffness of right shoulder, not elsewhere classified  Stiffness of right hand, not elsewhere classified  Pain in right hand  Pain in left hand  Other lack of coordination  Rationale for Evaluation and Treatment: Rehabilitation  SUBJECTIVE:   SUBJECTIVE STATEMENT: Pt reports continued pain and stiffness  in her hands Pt accompanied by: self  PERTINENT HISTORY: 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 Yes: NPRS scale: 6/10 Pain location: bilateral hands Pain description: aching, stiff Aggravating factors: gripping  Relieving factors: heat, meds Pt also has shoulder pain 4-5/10 which OT will not address as PT is working with pt. to address.  FALLS: Has patient fallen in last 6 months? No  LIVING ENVIRONMENT: Lives with: lives alone Lives in:  House/apartment Stairs: yes   PLOF: Independent  PATIENT GOALS: improve functional use of hands and learn adapted strategies for ADLs/IADLs.  NEXT MD VISIT: unknown  OBJECTIVE:  Note: Objective measures were completed at Evaluation unless otherwise noted.  HAND DOMINANCE: Right  ADLs: Overall ADLs: unable to grip credit card to remove from ATM Transfers/ambulation related to ADLs: mod I Eating: drops silverware Grooming: drops toothbrush, uses electric toothbrush, holding comb Upper body dressing: adjusting bra Lower body dressing: difficulty pulling up pants  Toileting: hygeine was difficult initally Bathing: mod I, difficult with shaving Tub shower transfers: walk in shower    FUNCTIONAL OUTCOME MEASURES: Quick Dash: 05/30/23- 75%% disability  UPPER EXTREMITY ROM:   RUE wrist flexion/ extension: 70/ 50, LUE wrist flexion/ extension: 70/ 45 Pt demonstrates grossly 50% composite flexion for right and 555 with left.   Active ROM Right eval Left eval  Thumb MCP (0-60) 45 55  Thumb IP (0-80) 25 10  Thumb Radial abd/add (0-55)     Thumb Palmar abd/add (0-45)     Thumb Opposition to Small Finger yes yes   Index MCP (0-90)     Index PIP (0-100)     Index DIP (0-70)      Long MCP (0-90)      Long PIP (0-100)      Long DIP (0-70)      Ring MCP (0-90)      Ring PIP (0-100)      Ring DIP (0-70)      Little MCP (0-90)      Little PIP (0-100)      Little DIP (0-70)      (Blank rows = not tested)   HAND FUNCTION: Grip strength: Right: 8 lbs; Left: 4 lbs,   SENSATION: Light touch: Impaired index finger LUE  EDEMA: Pt with bony defomities at PIP joints of all digits which pt reports are bone spurs, Pt also has bony deformity at DIP for right thumb, index and 5th digit and LUE small and index fingers  COGNITION: Overall cognitive status: Within functional limits for tasks assessed   OBSERVATIONS: Pleasant female desires to increase functional use of bilateral  UE's   TREATMENT DATE: 07/27/23- Paraffin to right hand x 10 mins while pt performed A/ROM  for LUE: blocking exercises followed by P/ROM composite flexion to digits individually and compositely, A/ROM thumb flexion/ extension and finger thumb opposition. Paraffin to LUE x 10 mins  while RUE A/ROM blocking exercises followed by P/ROM composite flexion to digits individually and compositely, finger thumb opposition and thumb flexion. Gripping stress ball with left and right UE's for composite finger flexion except for middle digit of R hand alternately and individual tip pinch for digits with exception of middle digit right hand.  07/25/23 Pt reports she loves her button hook and it is  working well. Paraffin to right hand x 10 mins while pt performed A/ROM  for LUE: blocking exercises followed by P/ROM composite flexion to digits individually and compositely, A/ROM thumb  flexion/ extension and finger thumb opposition..  Paraffin to RUE x 10 mins  while LUE A/ROM blocking exercises followed by P/ROM composite flexion to digits individually and compositely, finger thumb opposition and thumb flexion.  Composite finger flexion after stretching: 3 cm from palmar crease- LUE, 1 3/4 from palmar crease- RUE  07/13/23 Pt used button hook to fasten the buttons on her shirt. Paraffin to left hand x 10 mins while pt perfromed A/ROM  for RUE: blocking exercises followed by P/ROM composite flexion to digits individually and compositely, A/ROM thumb  flexion/ extension and finger thumb opposition..  Paraffin to LUE x 10 mins  while RUE A/ROM blocking exercises followed by P/ROM composite flexion to digits individually and compositely, finger thumb opposition and thumb flexion. Pt reports she received her adpated writing utensils, pen again and they are working well. Pt demonstrates improved bilateral UE flexibility.  07/06/23 Pt was shown buttonhook and she practiced using it with good success. Pt plans to order one. Pt was  also shown the Pen again for writing acitivities. pt returned demonstration of use. It places RUE in a better position for writing. Paraffin to right hand x 10 mins while pt perfromed A/ROM  for LUE: blocking exercises followed by P/ROM composite flexion to digits individually and compositely, A/ROM thumb  flexion/ extension.  Paraffin to LUE x 10 mins  while RUE A/ROM blocking exercises followed by P/ROM composite flexion to digits individually and compositely, A/ROM .  Yellow putty was issued for sustained grip bilateral UE's and tip pinch for LUE, pt returned demonstration grossly 20 reps each min v.c  Wash cloth scrunches and flattening 10 reps min v.c   06/29/23-Paraffin to left hand x 10 mins while pt perfromed A/ROM  for RUE: blocking exercises for each digit, followed by P/ROM composite flexion to digits individually and compositely. Paraffin to RUE x 10 mins  while LUE A/ROM blocking exercises for each digit,  followed by P/ROM composite flexion to digits individually and compositely.  Yellow putty was issued for sustained grip bilateral UE's and tip pinch for LUE, pt returned demonstration grossly 20 reps each min v.c  Wash cloth scrunches and flattening 5-10 reps each hand, min v.c    06/27/23- Paraffin to left hand x 10 mins while pt perfromed A/ROM  for RUE: blocking exercises for each digit, thumb flexion extension, followed by P/ROM composite flexion to digits individually and compositely. Pt demonstrates improved composite finger flexion end of session. Paraffin to RUE x 10 mins  while LUE A/ROM blocking exercises for each digit, thumb flexion extension, followed by P/ROM composite flexion to digits individually and compositely. Pt will improved composite finger flexion end of session.  06/22/23- Pt brought in her splints, the splint for left hand is fitting well. Pt can benefit from a different splint for RUE that places index finger in greater extension. Therapist fabricated an index finger  splint to position PIP in extension and to address DIP deformity. Pt reports splint fits well A/ROM PIP blocking to digits of left and right hands, thumb flexion/ extension, opposition, min v.c and min facilitation Pt brought in her AE bottle openers and pt practiced opening plastic bottle. Pt was issued additional foam grips for pens.    06/20/23 Today's therapy session was focused on fabrication of custom circumferential finger splints  for bilateral index fingers to improve DIP positioning to minimize risk for additional deformity. 1 splint was fabicated for right index and 2 for left. Pt's second index finger splint also promotes finger extension at PIP joint.  Pt was instructed in splint wear, care and precautions. Pt. reports AE is helping at home. 06/14/23 paraffin x 10 mins to bilateral UE's Ice pack to Left shoulder x 10 mins for pain relief, no adverse reactions Reviewed PIP, DIP blocking exercises, finger thumb opposition and thumb flexion for bilateral UE's. Discussed recommendations for adapted strategies for ADLs including use of food processor to shred chicken. Pt reports using food processory for cutting vegetables and it worked well. Pt reports she purchased several items we discussed last visit and overall they are working well. Therapist began assessing splinting needs, oval 8's were tried but did not provide correct positioning, pt will benefit from custom finger splints in the future to improve DIP positioning.    06/06/22- Paraffin x 10 mins to bilateral UE's for pain and stiffness, no adverse reactions Reviewed A/ROM HEP issued last visit 5-10 reps each bilateral UE's. Discussions regarding activity modification and potential AE including opening bottles/ jars, and use of food processor for chopping vegatables instead of chopping with a knife. Pt was shown some options on the internet. Discussed importance of warming up before activity with paraffin or gentle A/ROM   05/30/23-  Paraffin x 10 mins to bilateral UE's for pain and stiffness, then pt was instucted in A/ROM HEP. See pt. instructions. Therapist began discussing activity modifications and AE. Pt was provided with a foam grip for increased ease with handwriting.  Pt tried using the sock aide however it was more challenging than donning the sock the traditional way.   05/24/23 eval only                                                                                                                                PATIENT EDUCATION: Education details:  yellow putty HEP- see pt instructions Person educated: Patient Education method: Explanation, demonstration, v.c , handout Education comprehension: verbalized understanding, returned demonstration  HOME EXERCISE PROGRAM: AROM  GOALS: Goals reviewed with patient? Yes  SHORT TERM GOALS: Target date: 06/24/23  I with HEP  Goal status:  met 06/07/23  2.  I with positioning/ splinting prn to minimize pain and defomity   Goal status: met, 07/13/23  3.  Pt will increase bilateral grip strength by 5 lbs for increased UE functional use. Baseline: 05/30/23- RUE 8 lbs, LUE 4 lbs Goal status:  ongoing, 07/05/23    LONG TERM GOALS: Target date: 08/16/23  I with updated HEP  Goal status: ongoing  2.  Pt will improve Quick Dash score to 70% disability Baseline: 75% (05/30/23) Goal status:  ongoing  3.  Pt will demonstrate at least 65% composte flexion for LUE for increased functional use.  Goal status: ongoing  4.  I with adapted  strategies/ adapted equipment to minimize pain and to increase pt I with ADLs/IADLs  Goal status:  ongoing, pt educated in buttook and other adapted devices/ strategies for opening continers.07/13/23  5.  Pt will demonstrate at least 65% composite flexion for RUE for increased functional use.  Goal status:  ongoing    ASSESSMENT:  CLINICAL IMPRESSION:Pt  is progressing towards goals with imrpoving ROM and flexibility. Pt  reports using her button hook frequently. Marland KitchenPERFORMANCE DEFICITS: in functional skills including ADLs, IADLs, coordination, sensation, edema, ROM, strength, pain, flexibility, Fine motor control, Gross motor control, endurance, decreased knowledge of precautions, decreased knowledge of use of DME, and UE functional use,, and psychosocial skills including coping strategies, environmental adaptation, habits, interpersonal interactions, and routines and behaviors.   IMPAIRMENTS: are limiting patient from ADLs, IADLs, rest and sleep, education, play, leisure, and social participation.   COMORBIDITIES: may have co-morbidities  that affects occupational performance. Patient will benefit from skilled OT to address above impairments and improve overall function.  MODIFICATION OR ASSISTANCE TO COMPLETE EVALUATION: No modification of tasks or assist necessary to complete an evaluation.  OT OCCUPATIONAL PROFILE AND HISTORY: Detailed assessment: Review of records and additional review of physical, cognitive, psychosocial history related to current functional performance.  CLINICAL DECISION MAKING: LOW - limited treatment options, no task modification necessary  REHAB POTENTIAL: Good  EVALUATION COMPLEXITY: Low      PLAN:  OT FREQUENCY: 2x/week plus eval  OT DURATION: 12 weeks  PLANNED INTERVENTIONS: 97168 OT Re-evaluation, 97535 self care/ADL training, 16109 therapeutic exercise, 97530 therapeutic activity, 97112 neuromuscular re-education, 97140 manual therapy, 97113 aquatic therapy, 97035 ultrasound, 97018 paraffin, 60454 fluidotherapy, 97010 moist heat, 97010 cryotherapy, 97034 contrast bath, 97760 Orthotics management and training, 09811 Splinting (initial encounter), [id number] Subsequent splinting/medication, passive range of motion, energy conservation, coping strategies training, patient/family education, and DME and/or AE instructions  RECOMMENDED OTHER SERVICES: none  CONSULTED AND AGREED WITH  PLAN OF CARE: Patient  PLAN FOR NEXT SESSION: review putty HEP,  AE,   Kasim Mccorkle, OT 07/28/2023, 10:17 AM

## 2023-07-27 NOTE — Therapy (Signed)
 OUTPATIENT PHYSICAL THERAPY SHOULDER    Patient Name: Veronica Galvan MRN: [id number] DOB:Apr 11, 1956, 68 y.o., female Today's Date: 07/27/2023  END OF SESSION:  PT End of Session - 07/27/23 0837     Visit Number 61    Date for PT Re-Evaluation 08/06/23    Authorization Type [employer] [place]    PT Start Time [phone]    PT Stop Time 0842    PT Time Calculation (min) 44 min    Activity Tolerance Patient tolerated treatment well    Behavior During Therapy WFL for tasks assessed/performed             Past Medical History:  Diagnosis Date   Allergy    Anxiety    Arthritis    hands   Cataract    Diabetes mellitus without complication (HCC)    type 2   Family history of adverse reaction to anesthesia    son has malignant hyperthermia, daughter does not daughter recently had c section without problems   GERD (gastroesophageal reflux disease)    Headache    sinus   Hyperlipidemia    Hypertension    PONV (postoperative nausea and vomiting)    nausea only   Ulcer, stomach peptic yrs ago   Past Surgical History:  Procedure Laterality Date   APPENDECTOMY     both hells bone spur repair     both heels with metal clips   both shoulder rotator cuff repair     CESAREAN SECTION     x 1   COLONOSCOPY WITH PROPOFOL N/A 09/07/2016   Procedure: COLONOSCOPY WITH PROPOFOL;  Surgeon: Charolett Bumpers, MD;  Location: [facility];  Service: Endoscopy;  Laterality: N/A;   colonscopy  06/2011   polyps   EYE SURGERY     FRACTURE SURGERY     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/10/2022   Procedure: REVERSE SHOULDER ARTHROPLASTY;  Surgeon: Francena Hanly, MD;  Location: [facility];  Service: Orthopedics;  Laterality: Right;  Please follow in room 6 if able   TUBAL LIGATION     VESICO-VAGINAL FISTULA REPAIR     Patient Active Problem List   Diagnosis Date Noted   Gastroesophageal reflux disease 11/16/2021   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 08/18/2021    Dermatofibroma 08/18/2021   History of malignant neoplasm of skin 08/18/2021   Lentigo 08/18/2021   Melanocytic nevi of trunk 08/18/2021   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   Rosacea 08/18/2021   Actinic keratosis 08/18/2021   Sensorineural hearing loss (SNHL) of left ear with unrestricted hearing of right ear 07/26/2021   Tinnitus of left ear 07/26/2021   Acute recurrent maxillary sinusitis 06/22/2021   Primary hypertension 01/12/2021   Hyperlipidemia associated with type 2 diabetes mellitus (HCC) 01/12/2021   Arthritis 01/12/2021   Type II diabetes mellitus (HCC) 11/25/2019   Ingrown toenail 09/05/2018   Neck pain 01/29/2018   Tick bite 10/24/2017    PCP: Dayton Scrape, [name]  REFERRING PROVIDER: Rennis Chris, MD  REFERRING DIAG: s/p right reverse TSA  THERAPY DIAG:  Muscle weakness (generalized)  Stiffness of right shoulder, not elsewhere classified  Acute pain of right shoulder  Cervicalgia  Rationale for Evaluation and Treatment: Rehabilitation  ONSET DATE: 11/05/22  SUBJECTIVE:  SUBJECTIVE STATEMENT:  Patient was referred back to her orthopedic surgeon, has appointment next week, she is still frustrated with her pain levels PAIN:  Are you having pain? Yes: NPRS scale: 6/10 Pain location: right shoulder, HA Pain description: dull ache at rest, sharp with motions Aggravating factors: quick motions, movements pain up to 10/10 Relieving factors: ice, rest, Tylenol at best 3/10  PRECAUTIONS: Shoulder Protocol in the chart  RED FLAGS: None   WEIGHT BEARING RESTRICTIONS: No  FALLS:  Has patient fallen in last 6 months? Yes. Number of falls 1  LIVING ENVIRONMENT: Lives with: lives alone Lives in: House/apartment Stairs: No Has following equipment at home:  None  OCCUPATION: retired  PLOF: Independent  PATIENT GOALS:dress without difficulty, do hair, have good ROM and less pain  NEXT MD VISIT:   OBJECTIVE:   DIAGNOSTIC FINDINGS:  See above  PATIENT SURVEYS:  FOTO 21  COGNITION: Overall cognitive status: Within functional limits for tasks assessed     SENSATION: WFL  POSTURE: Fwd head, rounded shoulders, elevated and guarded shoulder  UPPER EXTREMITY ROM:   Active ROM Right PROM eval Right PROM Supine 01/05/23 PROM  01/26/23 PROM 02/09/23 AROM sitting 02/14/23 AROM 02/21/23 AROM  03/02/23 AROM  03/22/23 AAROM 03/28/23 AROM Standing 04/13/23 AROM Standing 05/02/23 AROM  Standing 05/11/23 AROM  Standing 05/25/23 AROM Standing 06/01/23 AROM Standing  07/06/23 AROM  Standing 07/27/23  Shoulder flexion 35  118  123 90 96 95 100 111 110 112 120 125  129 132 130  Shoulder extension 5                  Shoulder abduction 20  90 92  73 80 83 90 93 90 92 98 100 102  105 95  Shoulder adduction                   Shoulder internal rotation 15             40 40  42  Shoulder external rotation 20  45 30  41  45 50 53 56 56 60 66 67 70 70   Elbow flexion 120                  Elbow extension 5                  Wrist flexion                   Wrist extension                   Wrist ulnar deviation                   Wrist radial deviation                   Wrist pronation                   Wrist supination                   (Blank rows = not tested)  UPPER EXTREMITY MMT:  No tested due to recent surgery  MMT Right eval Left eval  Shoulder flexion    Shoulder extension    Shoulder abduction    Shoulder adduction    Shoulder internal rotation    Shoulder external rotation    Middle trapezius    Lower trapezius    Elbow flexion    Elbow extension    Wrist  flexion    Wrist extension    Wrist ulnar deviation    Wrist radial deviation    Wrist pronation    Wrist supination    Grip strength (lbs)    (Blank rows = not  tested)  PALPATION:  Very tight and tender in the pectoral, upper trap, the entire right upper arm   TODAY'S TREATMENT:                                                                                                                                         DATE:  07/27/23 Gentle shoulder distractions Gentle PROM to her tolerance, a lot of cues to relax Neural tension stretches of the right UE STM to the upper trap, neck and rhomboid STM to the biceps, deltoid and pectoral area  07/25/23 UBE level 3 x 4 minutes for mm perfusion Lats 15# too painful Seated row 10# Doorway stretch PROM right shoulder all motions to tolerance STM to the tight upper arm and the right pectoral area  07/20/23 UBE level 3 x 4 minutes Ball vs wall Rolling ball up wall Small weighted ball toss 2 ways and then with her doing ER Passive stretch right shoulder STM to the right shoulder and upper arm  07/18/23 Passive stretch right shoulder Gentle joint mobs  UE neural tension stretches STM to the right pectoral, right biceps, deltoid area Wall slides Wall circles Doorway stretch  07/13/23 [place] to the right biceps and lateral right upper arm/shoulder STM to the above Passive stretch all motions of the right shoulder to her tolerance, tried some contract relax  Added to HEP below and performed  07/11/23 UBE level 3 x 4 minutes 15# lats 2x10 5# chest press Doorway stretch STM to the right upper trap, neck, pectoral area and the right upper arm Passive stretch of the right upper arm all motions  07/06/23 Push pull with Nustep and then cross body push and pull Passive stretch to the right UE all motions Right UE STM [place] to the right upper arm 100% Supine 3# punches 3# isometric circles 2# ER/IR 2# flexion Vaso medium pressure 34 degrees  07/03/23 Nustep level 5 x 5 minutes Doorway stretch Star gazer stretch Cross body stretch STM to the right bicep, deltoid, pectoral and upper trap and  rhomboid, PROM all right shoulder motions Vaso medium pressure 35 degrees  06/29/23 UBE level 4 x 4 minutes Doorway stretch 3 ways Supine Passive stretch all motions 3# punches, flexion, serratus and isometric circles STM to the right upper arm, right trap and pectoral area Vaso medium pressure 34 degrees   PATIENT EDUCATION: Education details: poc/hep Person educated: Patient Education method: Programmer, multimedia, Facilities manager, Actor cues, Verbal cues, and Handouts Education comprehension: verbalized understanding  HOME EXERCISE PROGRAM: Access Code: [email or web address] URL: [email or web address] Date: 12/20/2022 Prepared by: Stacie Glaze  Exercises - Seated Shoulder Flexion Towel Slide at Table Top  -  2 x daily - 7 x weekly - 2 sets - 10 reps - 3 hold - Seated Shoulder External Rotation PROM on Table  - 2 x daily - 7 x weekly - 2 sets - 10 reps - 3 hold - Seated Elbow Extension and Shoulder External Rotation AAROM at Table with Towel  - 2 x daily - 7 x weekly - 2 sets - 10 reps - 3 hold - Circular Shoulder Pendulum with Table Support  - 2 x daily - 7 x weekly - 2 sets - 10 reps - 3 hold  Access Code: [email or web address] URL: [email or web address] Date: 07/13/2023 Prepared by: Stacie Glaze  Exercises - Isometric Shoulder Flexion at Wall  - 1 x daily - 7 x weekly - 1 sets - 10 reps - 3 hold - Standing Isometric Shoulder Internal Rotation at Doorway  - 1 x daily - 7 x weekly - 1 sets - 10 reps - 3 hold - Isometric Shoulder Extension at Wall  - 1 x daily - 7 x weekly - 1 sets - 10 reps - 3 hold - Isometric Shoulder Abduction at Wall  - 1 x daily - 7 x weekly - 1 sets - 10 reps - 3 hold - Standing Isometric Shoulder External Rotation with Doorway  - 1 x daily - 7 x weekly - 1 sets - 10 reps - 3 hold  ASSESSMENT:  CLINICAL IMPRESSION: Patient frustrated with her pain levels, she will see the surgeons office next week.  Issues for [place] is prior to Thanksgiving she was  doing well with minimal pain and improving ROM.  Since that time her ROM has definitely improved to the point where a normal reverse total shoulder would be, the issue however is her pain levels in the right lateral and anterior upper arm have increased, she was very, very active from Thanksgiving until [place].  She also had an incident where a massage therapist used IASTM on the right lateral upper arm, the arm still feels warm at times.  She is also reporting left shoulder pain since the right arm hurts so much so she is probably over using the left arm now.  I have changed to isometrics, PROM and STM to try to help the ROM and help with pain levels.  The increase of pain is currently the issue OBJECTIVE IMPAIRMENTS: cardiopulmonary status limiting activity, decreased activity tolerance, decreased endurance, decreased ROM, decreased strength, increased edema, increased muscle spasms, impaired flexibility, impaired UE functional use, improper body mechanics, postural dysfunction, and pain .  REHAB POTENTIAL: Good  CLINICAL DECISION MAKING: Evolving/moderate complexity  EVALUATION COMPLEXITY: Low   GOALS: Goals reviewed with patient? Yes  SHORT TERM GOALS: Target date: 01/01/23  Independent with initial HEP Goal status: 12/27/22 MET  LONG TERM GOALS: Target date: 03/22/23  Decrease pain 50% Goal status: progressing 07/25/23  2.  Dress without difficulty Goal status: progressing 07/25/23  3.  Do hair without difficulty Goal status: able to reach the top of head at times progressing 2/20/253/25/25 4.  Increase AROM right shoulder flexion to 130 degrees Goal status: 07/25/23 progressing 130 degrees with pain  5.  Increase right shoulder ER to 60 degrees Goal status: met 07/06/23  6.  Return to water aerobics and or gym activity Goal status:progressing met 06/01/23  PLAN:  PT FREQUENCY: 1-2x/week  PT DURATION: 12 weeks  PLANNED INTERVENTIONS: Therapeutic exercises, Therapeutic  activity, Neuromuscular re-education, Balance training, Gait training, Patient/Family education, Self Care, Joint mobilization, Dry Needling, Electrical stimulation, Cryotherapy,  Vasopneumatic device, and Manual therapy  PLAN FOR NEXT SESSION:   see what MD thinks Jearld Lesch, PT 07/27/2023, 8:37 AM [facility]. [place], [place], [address] Phone: [phone]   Fax:  [phone]

## 2023-07-31 ENCOUNTER — Other Ambulatory Visit: Payer: Self-pay | Admitting: Family

## 2023-08-01 ENCOUNTER — Other Ambulatory Visit: Payer: Self-pay | Admitting: Family

## 2023-08-01 ENCOUNTER — Encounter: Payer: Self-pay | Admitting: Family

## 2023-08-01 ENCOUNTER — Ambulatory Visit: Admitting: Physical Therapy

## 2023-08-01 ENCOUNTER — Ambulatory Visit: Attending: Family | Admitting: Occupational Therapy

## 2023-08-01 DIAGNOSIS — M79642 Pain in left hand: Secondary | ICD-10-CM | POA: Insufficient documentation

## 2023-08-01 DIAGNOSIS — M25641 Stiffness of right hand, not elsewhere classified: Secondary | ICD-10-CM | POA: Insufficient documentation

## 2023-08-01 DIAGNOSIS — M25642 Stiffness of left hand, not elsewhere classified: Secondary | ICD-10-CM | POA: Diagnosis not present

## 2023-08-01 DIAGNOSIS — M542 Cervicalgia: Secondary | ICD-10-CM | POA: Diagnosis not present

## 2023-08-01 DIAGNOSIS — M25611 Stiffness of right shoulder, not elsewhere classified: Secondary | ICD-10-CM | POA: Diagnosis not present

## 2023-08-01 DIAGNOSIS — M79641 Pain in right hand: Secondary | ICD-10-CM | POA: Insufficient documentation

## 2023-08-01 DIAGNOSIS — R600 Localized edema: Secondary | ICD-10-CM | POA: Diagnosis not present

## 2023-08-01 DIAGNOSIS — R278 Other lack of coordination: Secondary | ICD-10-CM | POA: Diagnosis not present

## 2023-08-01 DIAGNOSIS — M25511 Pain in right shoulder: Secondary | ICD-10-CM | POA: Insufficient documentation

## 2023-08-01 DIAGNOSIS — M6281 Muscle weakness (generalized): Secondary | ICD-10-CM | POA: Diagnosis not present

## 2023-08-01 MED ORDER — BLOOD GLUCOSE MONITORING SUPPL DEVI: 1.0000 | Freq: Three times a day (TID) | 0 refills | Status: AC

## 2023-08-01 MED ORDER — LANCETS MISC. MISC: 1.0000 | Freq: Three times a day (TID) | 0 refills | Status: AC

## 2023-08-01 MED ORDER — LANCET DEVICE MISC: 1.0000 | Freq: Three times a day (TID) | 0 refills | Status: AC

## 2023-08-01 MED ORDER — BLOOD GLUCOSE TEST VI STRP: 1.0000 | ORAL_STRIP | Freq: Three times a day (TID) | 0 refills | Status: AC

## 2023-08-01 NOTE — Therapy (Signed)
 OUTPATIENT OCCUPATIONAL THERAPY ORTHO Treatment  Patient Name: Veronica Galvan MRN: [id number] DOB:06/06/1955, 68 y.o., female Today's Date: 08/01/2023  PCP: Olive Bass [name] REFERRING PROVIDER: Olive Bass [name]  END OF SESSION:  OT End of Session - 08/01/23 1309     Visit Number 13    Date for OT Re-Evaluation 08/16/23    Authorization Type [employer] MCR    Authorization - Visit Number 13    Progress Note Due on Visit 20                  Past Medical History:  Diagnosis Date   Allergy    Anxiety    Arthritis    hands   Cataract    Diabetes mellitus without complication (HCC)    type 2   Family history of adverse reaction to anesthesia    son has malignant hyperthermia, daughter does not daughter recently had c section without problems   GERD (gastroesophageal reflux disease)    Headache    sinus   Hyperlipidemia    Hypertension    PONV (postoperative nausea and vomiting)    nausea only   Ulcer, stomach peptic yrs ago   Past Surgical History:  Procedure Laterality Date   APPENDECTOMY     both hells bone spur repair     both heels with metal clips   both shoulder rotator cuff repair     CESAREAN SECTION     x 1   COLONOSCOPY WITH PROPOFOL N/A 09/07/2016   Procedure: COLONOSCOPY WITH PROPOFOL;  Surgeon: Charolett Bumpers, MD;  Location: [facility];  Service: Endoscopy;  Laterality: N/A;   colonscopy  06/2011   polyps   EYE SURGERY     FRACTURE SURGERY     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/10/2022   Procedure: REVERSE SHOULDER ARTHROPLASTY;  Surgeon: Francena Hanly, MD;  Location: [facility];  Service: Orthopedics;  Laterality: Right;  Please follow in room 6 if able   TUBAL LIGATION     VESICO-VAGINAL FISTULA REPAIR     Patient Active Problem List   Diagnosis Date Noted   Gastroesophageal reflux disease 11/16/2021   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 08/18/2021   Dermatofibroma 08/18/2021   History of  malignant neoplasm of skin 08/18/2021   Lentigo 08/18/2021   Melanocytic nevi of trunk 08/18/2021   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   Rosacea 08/18/2021   Actinic keratosis 08/18/2021   Sensorineural hearing loss (SNHL) of left ear with unrestricted hearing of right ear 07/26/2021   Tinnitus of left ear 07/26/2021   Acute recurrent maxillary sinusitis 06/22/2021   Primary hypertension 01/12/2021   Hyperlipidemia associated with type 2 diabetes mellitus (HCC) 01/12/2021   Arthritis 01/12/2021   Type II diabetes mellitus (HCC) 11/25/2019   Ingrown toenail 09/05/2018   Neck pain 01/29/2018   Tick bite 10/24/2017    ONSET DATE: 05/19/23  REFERRING DIAG:  Diagnosis  M79.641,M79.642 (ICD-10-CM) - Bilateral hand pain    THERAPY DIAG:  Muscle weakness (generalized)  Stiffness of right shoulder, not elsewhere classified  Stiffness of right hand, not elsewhere classified  Pain in right hand  Pain in left hand  Other lack of coordination  Stiffness of left hand, not elsewhere classified  Rationale for Evaluation and Treatment: Rehabilitation  SUBJECTIVE:   SUBJECTIVE STATEMENT: Pt reports using her food processor to cut vegetables for soup Pt accompanied by: self  PERTINENT HISTORY: 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 Yes: NPRS scale: 6/10 Pain location: bilateral hands Pain description: aching, stiff Aggravating factors: gripping  Relieving factors: heat, meds Pt also has shoulder pain 4-5/10 which OT will not address as PT is working with pt. to address.  FALLS: Has patient fallen in last 6 months? No  LIVING ENVIRONMENT: Lives with: lives alone Lives in: House/apartment Stairs: yes   PLOF:  Independent  PATIENT GOALS: improve functional use of hands and learn adapted strategies for ADLs/IADLs.  NEXT MD VISIT: unknown  OBJECTIVE:  Note: Objective measures were completed at Evaluation unless otherwise noted.  HAND DOMINANCE: Right  ADLs: Overall ADLs: unable to grip credit card to remove from ATM Transfers/ambulation related to ADLs: mod I Eating: drops silverware Grooming: drops toothbrush, uses electric toothbrush, holding comb Upper body dressing: adjusting bra Lower body dressing: difficulty pulling up pants  Toileting: hygeine was difficult initally Bathing: mod I, difficult with shaving Tub shower transfers: walk in shower    FUNCTIONAL OUTCOME MEASURES: Quick Dash: 05/30/23- 75%% disability  UPPER EXTREMITY ROM:   RUE wrist flexion/ extension: 70/ 50, LUE wrist flexion/ extension: 70/ 45 Pt demonstrates grossly 50% composite flexion for right and 555 with left.   Active ROM Right eval Left eval  Thumb MCP (0-60) 45 55  Thumb IP (0-80) 25 10  Thumb Radial abd/add (0-55)     Thumb Palmar abd/add (0-45)     Thumb Opposition to Small Finger yes yes   Index MCP (0-90)     Index PIP (0-100)     Index DIP (0-70)      Long MCP (0-90)      Long PIP (0-100)      Long DIP (0-70)      Ring MCP (0-90)      Ring PIP (0-100)      Ring DIP (0-70)      Little MCP (0-90)      Little PIP (0-100)      Little DIP (0-70)      (Blank rows = not tested)   HAND FUNCTION: Grip strength: Right: 8 lbs; Left: 4 lbs,   SENSATION: Light touch: Impaired index finger LUE  EDEMA: Pt with bony defomities at PIP joints of all digits which pt reports are bone spurs, Pt also has bony deformity at DIP for right thumb, index and 5th digit and LUE small and index fingers  COGNITION: Overall cognitive status: Within functional limits for tasks assessed   OBSERVATIONS: Pleasant female desires to increase functional use of bilateral UE's   TREATMENT DATE: 08/01/23 Paraffin to  left hand x 10 mins while pt performed A/ROM  for RUE: blocking exercises followed by P/ROM composite flexion to digits individually and compositely, A/ROM thumb flexion/ extension and finger thumb opposition. Paraffin to RUE x 10 mins  while LUE A/ROM blocking exercises followed by P/ROM composite flexion to digits individually and compositely, finger thumb opposition and thumb flexion. Yelllow putty exercises for sustained grip and pinch for bilateral UE's min v.c individual tip pinch for digits with exception of middle digit right hand. Pt rpeorts using her food processor to cut vegetables for soup. Pt to bring in her pick next visit for adaptation.  07/27/23- Paraffin to right hand x 10 mins while pt performed A/ROM  for LUE: blocking exercises followed  by P/ROM composite flexion to digits individually and compositely, A/ROM thumb flexion/ extension and finger thumb opposition. Paraffin to LUE x 10 mins  while RUE A/ROM blocking exercises followed by P/ROM composite flexion to digits individually and compositely, finger thumb opposition and thumb flexion. Gripping stress ball with left and right UE's for composite finger flexion except for middle digit of R hand alternately and individual tip pinch for digits with exception of middle digit right hand.  07/25/23 Pt reports she loves her button hook and it is working well. Paraffin to right hand x 10 mins while pt performed A/ROM  for LUE: blocking exercises followed by P/ROM composite flexion to digits individually and compositely, A/ROM thumb  flexion/ extension and finger thumb opposition..  Paraffin to RUE x 10 mins  while LUE A/ROM blocking exercises followed by P/ROM composite flexion to digits individually and compositely, finger thumb opposition and thumb flexion.  Composite finger flexion after stretching: 3 cm from palmar crease- LUE, 1 3/4 from palmar crease- RUE  07/13/23 Pt used button hook to fasten the buttons on her shirt. Paraffin to left  hand x 10 mins while pt perfromed A/ROM  for RUE: blocking exercises followed by P/ROM composite flexion to digits individually and compositely, A/ROM thumb  flexion/ extension and finger thumb opposition..  Paraffin to LUE x 10 mins  while RUE A/ROM blocking exercises followed by P/ROM composite flexion to digits individually and compositely, finger thumb opposition and thumb flexion. Pt reports she received her adpated writing utensils, pen again and they are working well. Pt demonstrates improved bilateral UE flexibility.  07/06/23 Pt was shown buttonhook and she practiced using it with good success. Pt plans to order one. Pt was also shown the Pen again for writing acitivities. pt returned demonstration of use. It places RUE in a better position for writing. Paraffin to right hand x 10 mins while pt perfromed A/ROM  for LUE: blocking exercises followed by P/ROM composite flexion to digits individually and compositely, A/ROM thumb  flexion/ extension.  Paraffin to LUE x 10 mins  while RUE A/ROM blocking exercises followed by P/ROM composite flexion to digits individually and compositely, A/ROM .  Yellow putty was issued for sustained grip bilateral UE's and tip pinch for LUE, pt returned demonstration grossly 20 reps each min v.c  Wash cloth scrunches and flattening 10 reps min v.c   06/29/23-Paraffin to left hand x 10 mins while pt perfromed A/ROM  for RUE: blocking exercises for each digit, followed by P/ROM composite flexion to digits individually and compositely. Paraffin to RUE x 10 mins  while LUE A/ROM blocking exercises for each digit,  followed by P/ROM composite flexion to digits individually and compositely.  Yellow putty was issued for sustained grip bilateral UE's and tip pinch for LUE, pt returned demonstration grossly 20 reps each min v.c  Wash cloth scrunches and flattening 5-10 reps each hand, min v.c    06/27/23- Paraffin to left hand x 10 mins while pt perfromed A/ROM  for RUE: blocking  exercises for each digit, thumb flexion extension, followed by P/ROM composite flexion to digits individually and compositely. Pt demonstrates improved composite finger flexion end of session. Paraffin to RUE x 10 mins  while LUE A/ROM blocking exercises for each digit, thumb flexion extension, followed by P/ROM composite flexion to digits individually and compositely. Pt will improved composite finger flexion end of session.  06/22/23- Pt brought in her splints, the splint for left hand is fitting well. Pt can benefit  from a different splint for RUE that places index finger in greater extension. Therapist fabricated an index finger splint to position PIP in extension and to address DIP deformity. Pt reports splint fits well A/ROM PIP blocking to digits of left and right hands, thumb flexion/ extension, opposition, min v.c and min facilitation Pt brought in her AE bottle openers and pt practiced opening plastic bottle. Pt was issued additional foam grips for pens.    06/20/23 Today's therapy session was focused on fabrication of custom circumferential finger splints for bilateral index fingers to improve DIP positioning to minimize risk for additional deformity. 1 splint was fabicated for right index and 2 for left. Pt's second index finger splint also promotes finger extension at PIP joint.  Pt was instructed in splint wear, care and precautions. Pt. reports AE is helping at home. 06/14/23 paraffin x 10 mins to bilateral UE's Ice pack to Left shoulder x 10 mins for pain relief, no adverse reactions Reviewed PIP, DIP blocking exercises, finger thumb opposition and thumb flexion for bilateral UE's. Discussed recommendations for adapted strategies for ADLs including use of food processor to shred chicken. Pt reports using food processory for cutting vegetables and it worked well. Pt reports she purchased several items we discussed last visit and overall they are working well. Therapist began assessing  splinting needs, oval 8's were tried but did not provide correct positioning, pt will benefit from custom finger splints in the future to improve DIP positioning.    06/06/22- Paraffin x 10 mins to bilateral UE's for pain and stiffness, no adverse reactions Reviewed A/ROM HEP issued last visit 5-10 reps each bilateral UE's. Discussions regarding activity modification and potential AE including opening bottles/ jars, and use of food processor for chopping vegatables instead of chopping with a knife. Pt was shown some options on the internet. Discussed importance of warming up before activity with paraffin or gentle A/ROM   05/30/23- Paraffin x 10 mins to bilateral UE's for pain and stiffness, then pt was instucted in A/ROM HEP. See pt. instructions. Therapist began discussing activity modifications and AE. Pt was provided with a foam grip for increased ease with handwriting.  Pt tried using the sock aide however it was more challenging than donning the sock the traditional way.   05/24/23 eval only                                                                                                                                PATIENT EDUCATION: Education details:  yellow putty HEP review- see pt instructions Person educated: Patient Education method: Explanation, demonstration, v.c , handout Education comprehension: verbalized understanding, returned demonstration  HOME EXERCISE PROGRAM: AROM  GOALS: Goals reviewed with patient? Yes  SHORT TERM GOALS: Target date: 06/24/23  I with HEP  Goal status:  met 06/07/23  2.  I with positioning/ splinting prn to minimize pain and defomity   Goal status: met, 07/13/23  3.  Pt will increase bilateral grip strength by 5 lbs for increased UE functional use. Pt will increase bilateral grip strength to at least 25 lbs (after stretching). Baseline: 05/30/23- RUE 8 lbs, LUE 4 lbs Goal status:met 08/01/23-R 20 lbs,L  19 lbs, continue to address upgraded  goal.    LONG TERM GOALS: Target date: 08/16/23  I with updated HEP  Goal status: ongoing  2.  Pt will improve Quick Dash score to 70% disability Baseline: 75% (05/30/23) Goal status:  ongoing  3.  Pt will demonstrate at least 65% composte flexion for LUE for increased functional use.  Goal status: ongoing  4.  I with adapted strategies/ adapted equipment to minimize pain and to increase pt I with ADLs/IADLs  Goal status:  ongoing, pt educated in buttook and other adapted devices/ strategies for opening continers.07/13/23  5.  Pt will demonstrate at least 65% composite flexion for RUE for increased functional use.  Goal status:  ongoing    ASSESSMENT:  CLINICAL IMPRESSION:Pt  is progressing towards goals with imrpoving ROM and flexibility. Pt  has made significant gains in bilateral grip strength. Marland KitchenPERFORMANCE DEFICITS: in functional skills including ADLs, IADLs, coordination, sensation, edema, ROM, strength, pain, flexibility, Fine motor control, Gross motor control, endurance, decreased knowledge of precautions, decreased knowledge of use of DME, and UE functional use,, and psychosocial skills including coping strategies, environmental adaptation, habits, interpersonal interactions, and routines and behaviors.   IMPAIRMENTS: are limiting patient from ADLs, IADLs, rest and sleep, education, play, leisure, and social participation.   COMORBIDITIES: may have co-morbidities  that affects occupational performance. Patient will benefit from skilled OT to address above impairments and improve overall function.  MODIFICATION OR ASSISTANCE TO COMPLETE EVALUATION: No modification of tasks or assist necessary to complete an evaluation.  OT OCCUPATIONAL PROFILE AND HISTORY: Detailed assessment: Review of records and additional review of physical, cognitive, psychosocial history related to current functional performance.  CLINICAL DECISION MAKING: LOW - limited treatment options, no task  modification necessary  REHAB POTENTIAL: Good  EVALUATION COMPLEXITY: Low      PLAN:  OT FREQUENCY: 2x/week plus eval  OT DURATION: 12 weeks  PLANNED INTERVENTIONS: 97168 OT Re-evaluation, 97535 self care/ADL training, 16109 therapeutic exercise, 97530 therapeutic activity, 97112 neuromuscular re-education, 97140 manual therapy, 97113 aquatic therapy, 97035 ultrasound, 97018 paraffin, 60454 fluidotherapy, 97010 moist heat, 97010 cryotherapy, 97034 contrast bath, 97760 Orthotics management and training, 09811 Splinting (initial encounter), [id number] Subsequent splinting/medication, passive range of motion, energy conservation, coping strategies training, patient/family education, and DME and/or AE instructions  RECOMMENDED OTHER SERVICES: none  CONSULTED AND AGREED WITH PLAN OF CARE: Patient  PLAN FOR NEXT SESSION: AE, adaptation for ADLs.   Macee Venables, OT 08/01/2023, 1:10 PM

## 2023-08-02 DIAGNOSIS — Z96612 Presence of left artificial shoulder joint: Secondary | ICD-10-CM | POA: Diagnosis not present

## 2023-08-02 DIAGNOSIS — Z471 Aftercare following joint replacement surgery: Secondary | ICD-10-CM | POA: Diagnosis not present

## 2023-08-02 DIAGNOSIS — Z96611 Presence of right artificial shoulder joint: Secondary | ICD-10-CM | POA: Diagnosis not present

## 2023-08-03 ENCOUNTER — Encounter: Payer: Self-pay | Admitting: Physical Therapy

## 2023-08-03 ENCOUNTER — Ambulatory Visit: Admitting: Physical Therapy

## 2023-08-03 ENCOUNTER — Ambulatory Visit: Admitting: Occupational Therapy

## 2023-08-03 DIAGNOSIS — M6281 Muscle weakness (generalized): Secondary | ICD-10-CM

## 2023-08-03 DIAGNOSIS — R278 Other lack of coordination: Secondary | ICD-10-CM | POA: Diagnosis not present

## 2023-08-03 DIAGNOSIS — M25511 Pain in right shoulder: Secondary | ICD-10-CM | POA: Diagnosis not present

## 2023-08-03 DIAGNOSIS — R600 Localized edema: Secondary | ICD-10-CM | POA: Diagnosis not present

## 2023-08-03 DIAGNOSIS — M25642 Stiffness of left hand, not elsewhere classified: Secondary | ICD-10-CM | POA: Diagnosis not present

## 2023-08-03 DIAGNOSIS — M25611 Stiffness of right shoulder, not elsewhere classified: Secondary | ICD-10-CM

## 2023-08-03 DIAGNOSIS — M542 Cervicalgia: Secondary | ICD-10-CM

## 2023-08-03 DIAGNOSIS — M79641 Pain in right hand: Secondary | ICD-10-CM

## 2023-08-03 DIAGNOSIS — M79642 Pain in left hand: Secondary | ICD-10-CM

## 2023-08-03 DIAGNOSIS — M25641 Stiffness of right hand, not elsewhere classified: Secondary | ICD-10-CM

## 2023-08-03 NOTE — Therapy (Signed)
 OUTPATIENT PHYSICAL THERAPY SHOULDER    Patient Name: Veronica Galvan MRN: [id number] DOB:Jun 04, 1955, 68 y.o., female Today's Date: 08/03/2023  END OF SESSION:  PT End of Session - 08/03/23 1502     Visit Number 62    Date for PT Re-Evaluation 08/06/23    Authorization Type [employer] [place]    PT Start Time 1445    PT Stop Time 1530    PT Time Calculation (min) 45 min    Activity Tolerance Patient tolerated treatment well    Behavior During Therapy WFL for tasks assessed/performed             Past Medical History:  Diagnosis Date   Allergy    Anxiety    Arthritis    hands   Cataract    Diabetes mellitus without complication (HCC)    type 2   Family history of adverse reaction to anesthesia    son has malignant hyperthermia, daughter does not daughter recently had c section without problems   GERD (gastroesophageal reflux disease)    Headache    sinus   Hyperlipidemia    Hypertension    PONV (postoperative nausea and vomiting)    nausea only   Ulcer, stomach peptic yrs ago   Past Surgical History:  Procedure Laterality Date   APPENDECTOMY     both hells bone spur repair     both heels with metal clips   both shoulder rotator cuff repair     CESAREAN SECTION     x 1   COLONOSCOPY WITH PROPOFOL N/A 09/07/2016   Procedure: COLONOSCOPY WITH PROPOFOL;  Surgeon: Charolett Bumpers, MD;  Location: [facility];  Service: Endoscopy;  Laterality: N/A;   colonscopy  06/2011   polyps   EYE SURGERY     FRACTURE SURGERY     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/10/2022   Procedure: REVERSE SHOULDER ARTHROPLASTY;  Surgeon: Francena Hanly, MD;  Location: [facility];  Service: Orthopedics;  Laterality: Right;  Please follow in room 6 if able   TUBAL LIGATION     VESICO-VAGINAL FISTULA REPAIR     Patient Active Problem List   Diagnosis Date Noted   Gastroesophageal reflux disease 11/16/2021   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 08/18/2021    Dermatofibroma 08/18/2021   History of malignant neoplasm of skin 08/18/2021   Lentigo 08/18/2021   Melanocytic nevi of trunk 08/18/2021   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   Rosacea 08/18/2021   Actinic keratosis 08/18/2021   Sensorineural hearing loss (SNHL) of left ear with unrestricted hearing of right ear 07/26/2021   Tinnitus of left ear 07/26/2021   Acute recurrent maxillary sinusitis 06/22/2021   Primary hypertension 01/12/2021   Hyperlipidemia associated with type 2 diabetes mellitus (HCC) 01/12/2021   Arthritis 01/12/2021   Type II diabetes mellitus (HCC) 11/25/2019   Ingrown toenail 09/05/2018   Neck pain 01/29/2018   Tick bite 10/24/2017    PCP: Dayton Scrape, [name]  REFERRING PROVIDER: Rennis Chris, MD  REFERRING DIAG: s/p right reverse TSA  THERAPY DIAG:  Muscle weakness (generalized)  Stiffness of right shoulder, not elsewhere classified  Acute pain of right shoulder  Cervicalgia  Localized edema  Rationale for Evaluation and Treatment: Rehabilitation  ONSET DATE: 11/05/22  SUBJECTIVE:  SUBJECTIVE STATEMENT:  Patient saw the PA, will stop treatment, she will have blood work done and a bone scan PAIN:  Are you having pain? Yes: NPRS scale: 6/10 Pain location: right shoulder, HA Pain description: dull ache at rest, sharp with motions Aggravating factors: quick motions, movements pain up to 10/10 Relieving factors: ice, rest, Tylenol at best 3/10  PRECAUTIONS: Shoulder Protocol in the chart  RED FLAGS: None   WEIGHT BEARING RESTRICTIONS: No  FALLS:  Has patient fallen in last 6 months? Yes. Number of falls 1  LIVING ENVIRONMENT: Lives with: lives alone Lives in: House/apartment Stairs: No Has following equipment at home: None  OCCUPATION: retired  PLOF:  Independent  PATIENT GOALS:dress without difficulty, do hair, have good ROM and less pain  NEXT MD VISIT:   OBJECTIVE:   DIAGNOSTIC FINDINGS:  See above  PATIENT SURVEYS:  FOTO 21  COGNITION: Overall cognitive status: Within functional limits for tasks assessed     SENSATION: WFL  POSTURE: Fwd head, rounded shoulders, elevated and guarded shoulder  UPPER EXTREMITY ROM:   Active ROM Right PROM eval Right PROM Supine 01/05/23 PROM  01/26/23 PROM 02/09/23 AROM sitting 02/14/23 AROM 02/21/23 AROM  03/02/23 AROM  03/22/23 AAROM 03/28/23 AROM Standing 04/13/23 AROM Standing 05/02/23 AROM  Standing 05/11/23 AROM  Standing 05/25/23 AROM Standing 06/01/23 AROM Standing  07/06/23 AROM  Standing 07/27/23  Shoulder flexion 35  118  123 90 96 95 100 111 110 112 120 125  129 132 130  Shoulder extension 5                  Shoulder abduction 20  90 92  73 80 83 90 93 90 92 98 100 102  105 95  Shoulder adduction                   Shoulder internal rotation 15             40 40  42  Shoulder external rotation 20  45 30  41  45 50 53 56 56 60 66 67 70 70   Elbow flexion 120                  Elbow extension 5                  Wrist flexion                   Wrist extension                   Wrist ulnar deviation                   Wrist radial deviation                   Wrist pronation                   Wrist supination                   (Blank rows = not tested)  UPPER EXTREMITY MMT:  No tested due to recent surgery  MMT Right eval Left eval  Shoulder flexion    Shoulder extension    Shoulder abduction    Shoulder adduction    Shoulder internal rotation    Shoulder external rotation    Middle trapezius    Lower trapezius    Elbow flexion    Elbow extension    Wrist flexion  Wrist extension    Wrist ulnar deviation    Wrist radial deviation    Wrist pronation    Wrist supination    Grip strength (lbs)    (Blank rows = not tested)  PALPATION:  Very tight and  tender in the pectoral, upper trap, the entire right upper arm   TODAY'S TREATMENT:                                                                                                                                         DATE:  08/03/23 Pateint had a lot of questions regarding the bloodwork and the bone scan and what they are looking for, I strongly advised her to follow the MD/PA orders to wear sling and not use the arm, I did my best to answer her questions.  Finished with Vaso medium pressure 34 degrees  07/27/23 Gentle shoulder distractions Gentle PROM to her tolerance, a lot of cues to relax Neural tension stretches of the right UE STM to the upper trap, neck and rhomboid STM to the biceps, deltoid and pectoral area  07/25/23 UBE level 3 x 4 minutes for mm perfusion Lats 15# too painful Seated row 10# Doorway stretch PROM right shoulder all motions to tolerance STM to the tight upper arm and the right pectoral area  07/20/23 UBE level 3 x 4 minutes Ball vs wall Rolling ball up wall Small weighted ball toss 2 ways and then with her doing ER Passive stretch right shoulder STM to the right shoulder and upper arm  07/18/23 Passive stretch right shoulder Gentle joint mobs  UE neural tension stretches STM to the right pectoral, right biceps, deltoid area Wall slides Wall circles Doorway stretch  07/13/23 [place] to the right biceps and lateral right upper arm/shoulder STM to the above Passive stretch all motions of the right shoulder to her tolerance, tried some contract relax  Added to HEP below and performed  07/11/23 UBE level 3 x 4 minutes 15# lats 2x10 5# chest press Doorway stretch STM to the right upper trap, neck, pectoral area and the right upper arm Passive stretch of the right upper arm all motions  07/06/23 Push pull with Nustep and then cross body push and pull Passive stretch to the right UE all motions Right UE STM [place] to the right upper arm 100% Supine 3#  punches 3# isometric circles 2# ER/IR 2# flexion Vaso medium pressure 34 degrees  07/03/23 Nustep level 5 x 5 minutes Doorway stretch Star gazer stretch Cross body stretch STM to the right bicep, deltoid, pectoral and upper trap and rhomboid, PROM all right shoulder motions Vaso medium pressure 35 degrees  06/29/23 UBE level 4 x 4 minutes Doorway stretch 3 ways Supine Passive stretch all motions 3# punches, flexion, serratus and isometric circles STM to the right upper arm, right trap and pectoral area Vaso medium pressure 34 degrees  PATIENT EDUCATION: Education details: poc/hep Person educated: Patient Education method: Programmer, multimedia, Facilities manager, Actor cues, Verbal cues, and Handouts Education comprehension: verbalized understanding  HOME EXERCISE PROGRAM: Access Code: [email or web address] URL: [email or web address] Date: 12/20/2022 Prepared by: Stacie Glaze  Exercises - Seated Shoulder Flexion Towel Slide at Table Top  - 2 x daily - 7 x weekly - 2 sets - 10 reps - 3 hold - Seated Shoulder External Rotation PROM on Table  - 2 x daily - 7 x weekly - 2 sets - 10 reps - 3 hold - Seated Elbow Extension and Shoulder External Rotation AAROM at Table with Towel  - 2 x daily - 7 x weekly - 2 sets - 10 reps - 3 hold - Circular Shoulder Pendulum with Table Support  - 2 x daily - 7 x weekly - 2 sets - 10 reps - 3 hold  Access Code: [email or web address] URL: [email or web address] Date: 07/13/2023 Prepared by: Stacie Glaze  Exercises - Isometric Shoulder Flexion at Wall  - 1 x daily - 7 x weekly - 1 sets - 10 reps - 3 hold - Standing Isometric Shoulder Internal Rotation at Doorway  - 1 x daily - 7 x weekly - 1 sets - 10 reps - 3 hold - Isometric Shoulder Extension at Wall  - 1 x daily - 7 x weekly - 1 sets - 10 reps - 3 hold - Isometric Shoulder Abduction at Wall  - 1 x daily - 7 x weekly - 1 sets - 10 reps - 3 hold - Standing Isometric Shoulder External Rotation  with Doorway  - 1 x daily - 7 x weekly - 1 sets - 10 reps - 3 hold  ASSESSMENT:  CLINICAL IMPRESSION: Patient saw the MD/PA, they have advised to stop PT, rest and be in the sling, she will have blood work done and a bone scan.  She had a lot of questions and really reports that she will have a hard time going back in the sling and not using the arm OBJECTIVE IMPAIRMENTS: cardiopulmonary status limiting activity, decreased activity tolerance, decreased endurance, decreased ROM, decreased strength, increased edema, increased muscle spasms, impaired flexibility, impaired UE functional use, improper body mechanics, postural dysfunction, and pain .  REHAB POTENTIAL: Good  CLINICAL DECISION MAKING: Evolving/moderate complexity  EVALUATION COMPLEXITY: Low   GOALS: Goals reviewed with patient? Yes  SHORT TERM GOALS: Target date: 01/01/23  Independent with initial HEP Goal status: 12/27/22 MET  LONG TERM GOALS: Target date: 03/22/23  Decrease pain 50% Goal status: progressing 07/25/23  2.  Dress without difficulty Goal status: progressing 07/25/23  3.  Do hair without difficulty Goal status: able to reach the top of head at times progressing 2/20/253/25/25 4.  Increase AROM right shoulder flexion to 130 degrees Goal status: 07/25/23 progressing 130 degrees with pain  5.  Increase right shoulder ER to 60 degrees Goal status: met 07/06/23  6.  Return to water aerobics and or gym activity Goal status:progressing met 06/01/23  PLAN:  PT FREQUENCY: 1-2x/week  PT DURATION: 12 weeks  PLANNED INTERVENTIONS: Therapeutic exercises, Therapeutic activity, Neuromuscular re-education, Balance training, Gait training, Patient/Family education, Self Care, Joint mobilization, Dry Needling, Electrical stimulation, Cryotherapy, Vasopneumatic device, and Manual therapy  PLAN FOR NEXT SESSION:   hold 08/03/2023, 3:06 PM  [facility] at [facility] [address] [facility] [place], [place], [address] Phone: [phone]   Fax:  [phone]

## 2023-08-03 NOTE — Therapy (Signed)
 OUTPATIENT OCCUPATIONAL THERAPY ORTHO Treatment  Patient Name: Veronica Galvan MRN: [id number] DOB:1955-09-13, 68 y.o., female Today's Date: 08/03/2023  PCP: Olive Bass [name] REFERRING PROVIDER: Olive Bass [name]  END OF SESSION:  OT End of Session - 08/03/23 0950     Visit Number 14    Number of Visits 25    Date for OT Re-Evaluation 08/16/23    Authorization Type [employer] MCR    Authorization - Visit Number 14    Progress Note Due on Visit 20    OT Start Time [phone]    OT Stop Time 0930    OT Time Calculation (min) 38 min    Activity Tolerance Patient tolerated treatment well    Behavior During Therapy WFL for tasks assessed/performed                   Past Medical History:  Diagnosis Date   Allergy    Anxiety    Arthritis    hands   Cataract    Diabetes mellitus without complication (HCC)    type 2   Family history of adverse reaction to anesthesia    son has malignant hyperthermia, daughter does not daughter recently had c section without problems   GERD (gastroesophageal reflux disease)    Headache    sinus   Hyperlipidemia    Hypertension    PONV (postoperative nausea and vomiting)    nausea only   Ulcer, stomach peptic yrs ago   Past Surgical History:  Procedure Laterality Date   APPENDECTOMY     both hells bone spur repair     both heels with metal clips   both shoulder rotator cuff repair     CESAREAN SECTION     x 1   COLONOSCOPY WITH PROPOFOL N/A 09/07/2016   Procedure: COLONOSCOPY WITH PROPOFOL;  Surgeon: Charolett Bumpers, MD;  Location: [facility];  Service: Endoscopy;  Laterality: N/A;   colonscopy  06/2011   polyps   EYE SURGERY     FRACTURE SURGERY     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/10/2022   Procedure: REVERSE SHOULDER ARTHROPLASTY;  Surgeon: Francena Hanly, MD;  Location: [facility];  Service: Orthopedics;  Laterality: Right;  Please follow in room 6 if able   TUBAL LIGATION     VESICO-VAGINAL FISTULA  REPAIR     Patient Active Problem List   Diagnosis Date Noted   Gastroesophageal reflux disease 11/16/2021   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 08/18/2021   Dermatofibroma 08/18/2021   History of malignant neoplasm of skin 08/18/2021   Lentigo 08/18/2021   Melanocytic nevi of trunk 08/18/2021   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   Rosacea 08/18/2021   Actinic keratosis 08/18/2021   Sensorineural hearing loss (SNHL) of left ear with unrestricted hearing of right ear 07/26/2021   Tinnitus of left ear 07/26/2021   Acute recurrent maxillary sinusitis 06/22/2021   Primary hypertension 01/12/2021   Hyperlipidemia associated with type 2 diabetes mellitus (HCC) 01/12/2021   Arthritis 01/12/2021   Type II diabetes mellitus (HCC) 11/25/2019   Ingrown toenail 09/05/2018   Neck pain 01/29/2018   Tick bite 10/24/2017    ONSET DATE: 05/19/23  REFERRING DIAG:  Diagnosis  M79.641,M79.642 (ICD-10-CM) - Bilateral hand pain    THERAPY DIAG:  No diagnosis found.  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 Yes: NPRS scale: 6/10 Pain location: bilateral hands Pain description: aching, stiff Aggravating factors: gripping  Relieving factors: heat, meds Pt also has shoulder pain 4-5/10 which OT will not address as PT is working with pt. to address.  FALLS: Has patient fallen in last 6 months? No  LIVING ENVIRONMENT: Lives with: lives alone Lives in: House/apartment Stairs: yes   PLOF: Independent  PATIENT GOALS: improve  functional use of hands and learn adapted strategies for ADLs/IADLs.  NEXT MD VISIT: unknown  OBJECTIVE:  Note: Objective measures were completed at Evaluation unless otherwise noted.  HAND DOMINANCE: Right  ADLs: Overall ADLs: unable to grip credit card to remove from ATM Transfers/ambulation related to ADLs: mod I Eating: drops silverware Grooming: drops toothbrush, uses electric toothbrush, holding comb Upper body dressing: adjusting bra Lower body dressing: difficulty pulling up pants  Toileting: hygeine was difficult initally Bathing: mod I, difficult with shaving Tub shower transfers: walk in shower    FUNCTIONAL OUTCOME MEASURES: Quick Dash: 05/30/23- 75%% disability  UPPER EXTREMITY ROM:   RUE wrist flexion/ extension: 70/ 50, LUE wrist flexion/ extension: 70/ 45 Pt demonstrates grossly 50% composite flexion for right and 555 with left.   Active ROM Right eval Left eval  Thumb MCP (0-60) 45 55  Thumb IP (0-80) 25 10  Thumb Radial abd/add (0-55)     Thumb Palmar abd/add (0-45)     Thumb Opposition to Small Finger yes yes   Index MCP (0-90)     Index PIP (0-100)     Index DIP (0-70)      Long MCP (0-90)      Long PIP (0-100)      Long DIP (0-70)      Ring MCP (0-90)      Ring PIP (0-100)      Ring DIP (0-70)      Little MCP (0-90)      Little PIP (0-100)      Little DIP (0-70)      (Blank rows = not tested)   HAND FUNCTION: Grip strength: Right: 8 lbs; Left: 4 lbs,   SENSATION: Light touch: Impaired index finger LUE  EDEMA: Pt with bony defomities at PIP joints of all digits which pt reports are bone spurs, Pt also has bony deformity at DIP for right thumb, index and 5th digit and LUE small and index fingers  COGNITION: Overall cognitive status: Within functional limits for tasks assessed   OBSERVATIONS: Pleasant female desires to increase functional use of bilateral UE's   TREATMENT DATE: 08/03/23-Paraffin to RUE x 10 mins, no adverse reactions,  while therapist performed US to LUE 3.3 mhz, 0.8 w/cm 2,  20%x 8 mins no adverse reactions for stiffness. Paraffin to LUE x 10 mins, no adverse reactions, while therapist performed US to RUE 3.3 mhz, 0.8 w/cm 2,  20%x 8 mins no adverse reactions for stiffness. Gentle PIP blocking and passive ROM composite finger flexion followed by finger thumb opposition and thumb flxion bilateral UE's. Pt with decreased pain and improved flexibility after [place] today.   08/01/23 Paraffin to left hand x 10 mins while pt performed A/ROM  for RUE: blocking exercises followed by P/ROM composite flexion to digits individually and compositely, A/ROM  thumb flexion/ extension and finger thumb opposition. Paraffin to RUE x 10 mins  while LUE A/ROM blocking exercises followed by P/ROM composite flexion to digits individually and compositely, finger thumb opposition and thumb flexion. Yelllow putty exercises for sustained grip and pinch for bilateral UE's min v.c individual tip pinch for digits with exception of middle digit right hand. Pt rpeorts using her food processor to cut vegetables for soup. Pt to bring in her pick next visit for adaptation.  07/27/23- Paraffin to right hand x 10 mins while pt performed A/ROM  for LUE: blocking exercises followed by P/ROM composite flexion to digits individually and compositely, A/ROM thumb flexion/ extension and finger thumb opposition. Paraffin to LUE x 10 mins  while RUE A/ROM blocking exercises followed by P/ROM composite flexion to digits individually and compositely, finger thumb opposition and thumb flexion. Gripping stress ball with left and right UE's for composite finger flexion except for middle digit of R hand alternately and individual tip pinch for digits with exception of middle digit right hand.  07/25/23 Pt reports she loves her button hook and it is working well. Paraffin to right hand x 10 mins while pt performed A/ROM  for LUE: blocking exercises followed by P/ROM composite  flexion to digits individually and compositely, A/ROM thumb  flexion/ extension and finger thumb opposition..  Paraffin to RUE x 10 mins  while LUE A/ROM blocking exercises followed by P/ROM composite flexion to digits individually and compositely, finger thumb opposition and thumb flexion.  Composite finger flexion after stretching: 3 cm from palmar crease- LUE, 1 3/4 from palmar crease- RUE  07/13/23 Pt used button hook to fasten the buttons on her shirt. Paraffin to left hand x 10 mins while pt perfromed A/ROM  for RUE: blocking exercises followed by P/ROM composite flexion to digits individually and compositely, A/ROM thumb  flexion/ extension and finger thumb opposition..  Paraffin to LUE x 10 mins  while RUE A/ROM blocking exercises followed by P/ROM composite flexion to digits individually and compositely, finger thumb opposition and thumb flexion. Pt reports she received her adpated writing utensils, pen again and they are working well. Pt demonstrates improved bilateral UE flexibility.  07/06/23 Pt was shown buttonhook and she practiced using it with good success. Pt plans to order one. Pt was also shown the Pen again for writing acitivities. pt returned demonstration of use. It places RUE in a better position for writing. Paraffin to right hand x 10 mins while pt perfromed A/ROM  for LUE: blocking exercises followed by P/ROM composite flexion to digits individually and compositely, A/ROM thumb  flexion/ extension.  Paraffin to LUE x 10 mins  while RUE A/ROM blocking exercises followed by P/ROM composite flexion to digits individually and compositely, A/ROM .  Yellow putty was issued for sustained grip bilateral UE's and tip pinch for LUE, pt returned demonstration grossly 20 reps each min v.c  Wash cloth scrunches and flattening 10 reps min v.c   06/29/23-Paraffin to left hand x 10 mins while pt perfromed A/ROM  for RUE: blocking exercises for each digit, followed by P/ROM composite flexion to digits  individually and compositely. Paraffin to RUE x 10 mins  while LUE A/ROM blocking exercises for each digit,  followed by P/ROM composite flexion to digits individually and compositely.  Yellow putty was issued for sustained grip bilateral UE's and tip pinch for LUE, pt returned demonstration grossly 20 reps each min v.c  Wash cloth scrunches and flattening 5-10 reps each hand, min v.c  06/27/23- Paraffin to left hand x 10 mins while pt perfromed A/ROM  for RUE: blocking exercises for each digit, thumb flexion extension, followed by P/ROM composite flexion to digits individually and compositely. Pt demonstrates improved composite finger flexion end of session. Paraffin to RUE x 10 mins  while LUE A/ROM blocking exercises for each digit, thumb flexion extension, followed by P/ROM composite flexion to digits individually and compositely. Pt will improved composite finger flexion end of session.  06/22/23- Pt brought in her splints, the splint for left hand is fitting well. Pt can benefit from a different splint for RUE that places index finger in greater extension. Therapist fabricated an index finger splint to position PIP in extension and to address DIP deformity. Pt reports splint fits well A/ROM PIP blocking to digits of left and right hands, thumb flexion/ extension, opposition, min v.c and min facilitation Pt brought in her AE bottle openers and pt practiced opening plastic bottle. Pt was issued additional foam grips for pens.    06/20/23 Today's therapy session was focused on fabrication of custom circumferential finger splints for bilateral index fingers to improve DIP positioning to minimize risk for additional deformity. 1 splint was fabicated for right index and 2 for left. Pt's second index finger splint also promotes finger extension at PIP joint.  Pt was instructed in splint wear, care and precautions. Pt. reports AE is helping at home. 06/14/23 paraffin x 10 mins to bilateral UE's Ice pack  to Left shoulder x 10 mins for pain relief, no adverse reactions Reviewed PIP, DIP blocking exercises, finger thumb opposition and thumb flexion for bilateral UE's. Discussed recommendations for adapted strategies for ADLs including use of food processor to shred chicken. Pt reports using food processory for cutting vegetables and it worked well. Pt reports she purchased several items we discussed last visit and overall they are working well. Therapist began assessing splinting needs, oval 8's were tried but did not provide correct positioning, pt will benefit from custom finger splints in the future to improve DIP positioning.    06/06/22- Paraffin x 10 mins to bilateral UE's for pain and stiffness, no adverse reactions Reviewed A/ROM HEP issued last visit 5-10 reps each bilateral UE's. Discussions regarding activity modification and potential AE including opening bottles/ jars, and use of food processor for chopping vegatables instead of chopping with a knife. Pt was shown some options on the internet. Discussed importance of warming up before activity with paraffin or gentle A/ROM   05/30/23- Paraffin x 10 mins to bilateral UE's for pain and stiffness, then pt was instucted in A/ROM HEP. See pt. instructions. Therapist began discussing activity modifications and AE. Pt was provided with a foam grip for increased ease with handwriting.  Pt tried using the sock aide however it was more challenging than donning the sock the traditional way.   05/24/23 eval only  PATIENT EDUCATION: Education details: see above Person educated: Patient Education method: Explanation, demonstration,  Education comprehension: verbalized understanding, returned demonstration  HOME EXERCISE PROGRAM: AROM  GOALS: Goals reviewed with patient? Yes  SHORT TERM GOALS: Target date:  06/24/23  I with HEP  Goal status:  met 06/07/23  2.  I with positioning/ splinting prn to minimize pain and defomity   Goal status: met, 07/13/23  3.  Pt will increase bilateral grip strength by 5 lbs for increased UE functional use. Pt will increase bilateral grip strength to at least 25 lbs (after stretching). Baseline: 05/30/23- RUE 8 lbs, LUE 4 lbs Goal status:met 08/01/23-R 20 lbs,L  19 lbs, continue to address upgraded goal.    LONG TERM GOALS: Target date: 08/16/23  I with updated HEP  Goal status: ongoing  2.  Pt will improve Quick Dash score to 70% disability Baseline: 75% (05/30/23) Goal status:  ongoing  3.  Pt will demonstrate at least 65% composte flexion for LUE for increased functional use.  Goal status: ongoing  4.  I with adapted strategies/ adapted equipment to minimize pain and to increase pt I with ADLs/IADLs  Goal status:  ongoing, pt educated in buttook and other adapted devices/ strategies for opening continers.07/13/23  5.  Pt will demonstrate at least 65% composite flexion for RUE for increased functional use.  Goal status:  ongoing    ASSESSMENT:  CLINICAL IMPRESSION:Pt  is progressing towards goals with improved flexibility following [place]. Pt reports decreased thumb pain. Marland KitchenPERFORMANCE DEFICITS: in functional skills including ADLs, IADLs, coordination, sensation, edema, ROM, strength, pain, flexibility, Fine motor control, Gross motor control, endurance, decreased knowledge of precautions, decreased knowledge of use of DME, and UE functional use,, and psychosocial skills including coping strategies, environmental adaptation, habits, interpersonal interactions, and routines and behaviors.   IMPAIRMENTS: are limiting patient from ADLs, IADLs, rest and sleep, education, play, leisure, and social participation.   COMORBIDITIES: may have co-morbidities  that affects occupational performance. Patient will benefit from skilled OT to address above impairments and  improve overall function.  MODIFICATION OR ASSISTANCE TO COMPLETE EVALUATION: No modification of tasks or assist necessary to complete an evaluation.  OT OCCUPATIONAL PROFILE AND HISTORY: Detailed assessment: Review of records and additional review of physical, cognitive, psychosocial history related to current functional performance.  CLINICAL DECISION MAKING: LOW - limited treatment options, no task modification necessary  REHAB POTENTIAL: Good  EVALUATION COMPLEXITY: Low      PLAN:  OT FREQUENCY: 2x/week plus eval  OT DURATION: 12 weeks  PLANNED INTERVENTIONS: 97168 OT Re-evaluation, 97535 self care/ADL training, 78469 therapeutic exercise, 97530 therapeutic activity, 97112 neuromuscular re-education, 97140 manual therapy, 97113 aquatic therapy, 97035 ultrasound, 97018 paraffin, 62952 fluidotherapy, 97010 moist heat, 97010 cryotherapy, 97034 contrast bath, 97760 Orthotics management and training, 84132 Splinting (initial encounter), [id number] Subsequent splinting/medication, passive range of motion, energy conservation, coping strategies training, patient/family education, and DME and/or AE instructions  RECOMMENDED OTHER SERVICES: none  CONSULTED AND AGREED WITH PLAN OF CARE: Patient  PLAN FOR NEXT SESSION:US, ROM, AE   Britt Theard, OT 08/03/2023, 9:56 AM

## 2023-08-07 ENCOUNTER — Other Ambulatory Visit: Payer: Self-pay

## 2023-08-07 ENCOUNTER — Ambulatory Visit (AMBULATORY_SURGERY_CENTER): Payer: Medicare Other

## 2023-08-07 VITALS — Ht 61.0 in | Wt 192.0 lb

## 2023-08-07 DIAGNOSIS — Z860100 Personal history of colon polyps, unspecified: Secondary | ICD-10-CM

## 2023-08-07 MED ORDER — POLYETHYLENE GLYCOL 3350 17 GM/SCOOP PO POWD
238.0000 g | Freq: Every day | ORAL | 3 refills | Status: DC
Start: 2023-08-07 — End: 2023-11-28

## 2023-08-07 NOTE — Progress Notes (Signed)
 Denies allergies to eggs or soy products. Denies complication of anesthesia or sedation. Denies use of weight loss medication. Denies use of O2.   Emmi instructions given for colonoscopy.    Patient is allergic to Benzonate so she could not use Suprep. Prep was switched to Miralax.

## 2023-08-08 ENCOUNTER — Ambulatory Visit: Admitting: Physical Therapy

## 2023-08-08 ENCOUNTER — Ambulatory Visit: Admitting: Occupational Therapy

## 2023-08-08 DIAGNOSIS — M25611 Stiffness of right shoulder, not elsewhere classified: Secondary | ICD-10-CM | POA: Diagnosis not present

## 2023-08-08 DIAGNOSIS — M6281 Muscle weakness (generalized): Secondary | ICD-10-CM | POA: Diagnosis not present

## 2023-08-08 DIAGNOSIS — M79641 Pain in right hand: Secondary | ICD-10-CM

## 2023-08-08 DIAGNOSIS — M25641 Stiffness of right hand, not elsewhere classified: Secondary | ICD-10-CM | POA: Diagnosis not present

## 2023-08-08 DIAGNOSIS — M79642 Pain in left hand: Secondary | ICD-10-CM

## 2023-08-08 DIAGNOSIS — M25642 Stiffness of left hand, not elsewhere classified: Secondary | ICD-10-CM | POA: Diagnosis not present

## 2023-08-08 DIAGNOSIS — R278 Other lack of coordination: Secondary | ICD-10-CM

## 2023-08-08 DIAGNOSIS — R600 Localized edema: Secondary | ICD-10-CM | POA: Diagnosis not present

## 2023-08-08 DIAGNOSIS — M25511 Pain in right shoulder: Secondary | ICD-10-CM | POA: Diagnosis not present

## 2023-08-08 DIAGNOSIS — M542 Cervicalgia: Secondary | ICD-10-CM | POA: Diagnosis not present

## 2023-08-08 NOTE — Therapy (Signed)
 OUTPATIENT OCCUPATIONAL THERAPY ORTHO Treatment  Patient Name: Veronica Galvan MRN: [id number] DOB:1956-02-16, 68 y.o., female Today's Date: 08/08/2023  PCP: Olive Bass [name] REFERRING PROVIDER: Olive Bass [name]  END OF SESSION:  OT End of Session - 08/08/23 0942     Visit Number 15    Number of Visits 25    Date for OT Re-Evaluation 08/16/23    Authorization Type [employer] MCR    Authorization - Visit Number 15    Progress Note Due on Visit 20    OT Start Time 0850    OT Stop Time 0930    OT Time Calculation (min) 40 min                   Past Medical History:  Diagnosis Date   Allergy    Anxiety    Arthritis    hands   Cataract    Diabetes mellitus without complication (HCC)    type 2   Family history of adverse reaction to anesthesia    son has malignant hyperthermia, daughter does not daughter recently had c section without problems   GERD (gastroesophageal reflux disease)    Headache    sinus   Hyperlipidemia    Hypertension    PONV (postoperative nausea and vomiting)    nausea only   Ulcer, stomach peptic yrs ago   Past Surgical History:  Procedure Laterality Date   APPENDECTOMY     both hells bone spur repair     both heels with metal clips   both shoulder rotator cuff repair     CESAREAN SECTION     x 1   COLONOSCOPY WITH PROPOFOL N/A 09/07/2016   Procedure: COLONOSCOPY WITH PROPOFOL;  Surgeon: Charolett Bumpers, MD;  Location: [facility];  Service: Endoscopy;  Laterality: N/A;   colonscopy  06/2011   polyps   ELBOW FRACTURE SURGERY Left    EYE SURGERY     FRACTURE SURGERY     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/10/2022   Procedure: REVERSE SHOULDER ARTHROPLASTY;  Surgeon: Francena Hanly, MD;  Location: [facility];  Service: Orthopedics;  Laterality: Right;  Please follow in room 6 if able   TUBAL LIGATION     VESICO-VAGINAL FISTULA REPAIR     Patient Active Problem List   Diagnosis Date Noted   Gastroesophageal  reflux disease 11/16/2021   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 08/18/2021   Dermatofibroma 08/18/2021   History of malignant neoplasm of skin 08/18/2021   Lentigo 08/18/2021   Melanocytic nevi of trunk 08/18/2021   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   Rosacea 08/18/2021   Actinic keratosis 08/18/2021   Sensorineural hearing loss (SNHL) of left ear with unrestricted hearing of right ear 07/26/2021   Tinnitus of left ear 07/26/2021   Acute recurrent maxillary sinusitis 06/22/2021   Primary hypertension 01/12/2021   Hyperlipidemia associated with type 2 diabetes mellitus (HCC) 01/12/2021   Arthritis 01/12/2021   Type II diabetes mellitus (HCC) 11/25/2019   Ingrown toenail 09/05/2018   Neck pain 01/29/2018   Tick bite 10/24/2017    ONSET DATE: 05/19/23  REFERRING DIAG:  Diagnosis  M79.641,M79.642 (ICD-10-CM) - Bilateral hand pain    THERAPY DIAG:  Muscle weakness (generalized)  Stiffness of right hand, not elsewhere classified  Pain in right hand  Pain in left hand  Other lack of coordination  Stiffness of left hand, not elsewhere classified  Rationale for Evaluation and Treatment: Rehabilitation  SUBJECTIVE:  SUBJECTIVE STATEMENT: Pt reports  her right hand is stiff and painful today Pt accompanied by: self  PERTINENT HISTORY: 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 Yes: NPRS scale: 6/10 Pain location: bilateral hands Pain description: aching, stiff Aggravating factors: gripping  Relieving factors: heat, meds Pt also has shoulder pain 4-5/10 which OT will not address as PT is working with pt. to address.  FALLS: Has patient fallen in last 6 months? No  LIVING ENVIRONMENT: Lives  with: lives alone Lives in: House/apartment Stairs: yes   PLOF: Independent  PATIENT GOALS: improve functional use of hands and learn adapted strategies for ADLs/IADLs.  NEXT MD VISIT: unknown  OBJECTIVE:  Note: Objective measures were completed at Evaluation unless otherwise noted.  HAND DOMINANCE: Right  ADLs: Overall ADLs: unable to grip credit card to remove from ATM Transfers/ambulation related to ADLs: mod I Eating: drops silverware Grooming: drops toothbrush, uses electric toothbrush, holding comb Upper body dressing: adjusting bra Lower body dressing: difficulty pulling up pants  Toileting: hygeine was difficult initally Bathing: mod I, difficult with shaving Tub shower transfers: walk in shower    FUNCTIONAL OUTCOME MEASURES: Quick Dash: 05/30/23- 75%% disability  UPPER EXTREMITY ROM:   RUE wrist flexion/ extension: 70/ 50, LUE wrist flexion/ extension: 70/ 45 Pt demonstrates grossly 50% composite flexion for right and 555 with left.   Active ROM Right eval Left eval  Thumb MCP (0-60) 45 55  Thumb IP (0-80) 25 10  Thumb Radial abd/add (0-55)     Thumb Palmar abd/add (0-45)     Thumb Opposition to Small Finger yes yes   Index MCP (0-90)     Index PIP (0-100)     Index DIP (0-70)      Long MCP (0-90)      Long PIP (0-100)      Long DIP (0-70)      Ring MCP (0-90)      Ring PIP (0-100)      Ring DIP (0-70)      Little MCP (0-90)      Little PIP (0-100)      Little DIP (0-70)      (Blank rows = not tested)   HAND FUNCTION: Grip strength: Right: 8 lbs; Left: 4 lbs,   SENSATION: Light touch: Impaired index finger LUE  EDEMA: Pt with bony defomities at PIP joints of all digits which pt reports are bone spurs, Pt also has bony deformity at DIP for right thumb, index and 5th digit and LUE small and index fingers  COGNITION: Overall cognitive status: Within functional limits for tasks assessed   OBSERVATIONS: Pleasant female desires to increase  functional use of bilateral UE's   TREATMENT DATE: 08/08/23-Paraffin to RUE x 10 mins, no adverse reactions, while therapist performed US to LUE 3.3 mhz, 0.8 w/cm 2,  20%x 8 mins no adverse reactions for stiffness. Therapist discussed possible modifcations for eating with pt such as holding fork or spoon between digits, raising height of what she is eating by placing on a book, supporting right elbow with a pillow. Paraffin to LUE x 10 mins, no adverse reactions, while therapist performed US to RUE 3.3 mhz, 0.8 w/cm 2,  20%x 8 mins no adverse reactions for stiffness.Gentle PIP blocking and passive ROM individual  and composite finger flexion followed by finger  thumb flexion bilateral UE's. Pt reports hands are less painful at end of session.  08/03/23-Paraffin to RUE x 10 mins, no adverse reactions, while therapist performed US to LUE 3.3 mhz, 0.8 w/cm 2,  20%x 8 mins no adverse reactions for stiffness. Paraffin to LUE x 10 mins, no adverse reactions, while therapist performed US to RUE 3.3 mhz, 0.8 w/cm 2,  20%x 8 mins no adverse reactions for stiffness. Gentle PIP blocking and passive ROM composite finger flexion followed by finger thumb opposition and thumb flexion bilateral UE's. Pt with decreased pain and improved flexibility after [place] today.   08/01/23 Paraffin to left hand x 10 mins while pt performed A/ROM  for RUE: blocking exercises followed by P/ROM composite flexion to digits individually and compositely, A/ROM thumb flexion/ extension and finger thumb opposition. Paraffin to RUE x 10 mins  while LUE A/ROM blocking exercises followed by P/ROM composite flexion to digits individually and compositely, finger thumb opposition and thumb flexion. Yelllow putty exercises for sustained grip and pinch for bilateral UE's min v.c individual tip pinch for digits with exception of middle digit right hand. Pt rpeorts using her food processor to cut vegetables for soup. Pt to bring in her pick next visit for  adaptation.  07/27/23- Paraffin to right hand x 10 mins while pt performed A/ROM  for LUE: blocking exercises followed by P/ROM composite flexion to digits individually and compositely, A/ROM thumb flexion/ extension and finger thumb opposition. Paraffin to LUE x 10 mins  while RUE A/ROM blocking exercises followed by P/ROM composite flexion to digits individually and compositely, finger thumb opposition and thumb flexion. Gripping stress ball with left and right UE's for composite finger flexion except for middle digit of R hand alternately and individual tip pinch for digits with exception of middle digit right hand.  07/25/23 Pt reports she loves her button hook and it is working well. Paraffin to right hand x 10 mins while pt performed A/ROM  for LUE: blocking exercises followed by P/ROM composite flexion to digits individually and compositely, A/ROM thumb  flexion/ extension and finger thumb opposition..  Paraffin to RUE x 10 mins  while LUE A/ROM blocking exercises followed by P/ROM composite flexion to digits individually and compositely, finger thumb opposition and thumb flexion.  Composite finger flexion after stretching: 3 cm from palmar crease- LUE, 1 3/4 from palmar crease- RUE  07/13/23 Pt used button hook to fasten the buttons on her shirt. Paraffin to left hand x 10 mins while pt perfromed A/ROM  for RUE: blocking exercises followed by P/ROM composite flexion to digits individually and compositely, A/ROM thumb  flexion/ extension and finger thumb opposition..  Paraffin to LUE x 10 mins  while RUE A/ROM blocking exercises followed by P/ROM composite flexion to digits individually and compositely, finger thumb opposition and thumb flexion. Pt reports she received her adpated writing utensils, pen again and they are working well. Pt demonstrates improved bilateral UE flexibility.  07/06/23 Pt was shown buttonhook and she practiced using it with good success. Pt plans to order one. Pt was also shown  the Pen again for writing acitivities. pt returned demonstration of use. It places RUE in a better position for writing. Paraffin to right hand x 10 mins while pt perfromed A/ROM  for LUE: blocking exercises followed by P/ROM composite flexion to digits individually and compositely, A/ROM thumb  flexion/ extension.  Paraffin to LUE x 10 mins  while RUE A/ROM blocking  exercises followed by P/ROM composite flexion to digits individually and compositely, A/ROM .  Yellow putty was issued for sustained grip bilateral UE's and tip pinch for LUE, pt returned demonstration grossly 20 reps each min v.c  Wash cloth scrunches and flattening 10 reps min v.c   06/29/23-Paraffin to left hand x 10 mins while pt perfromed A/ROM  for RUE: blocking exercises for each digit, followed by P/ROM composite flexion to digits individually and compositely. Paraffin to RUE x 10 mins  while LUE A/ROM blocking exercises for each digit,  followed by P/ROM composite flexion to digits individually and compositely.  Yellow putty was issued for sustained grip bilateral UE's and tip pinch for LUE, pt returned demonstration grossly 20 reps each min v.c  Wash cloth scrunches and flattening 5-10 reps each hand, min v.c    06/27/23- Paraffin to left hand x 10 mins while pt perfromed A/ROM  for RUE: blocking exercises for each digit, thumb flexion extension, followed by P/ROM composite flexion to digits individually and compositely. Pt demonstrates improved composite finger flexion end of session. Paraffin to RUE x 10 mins  while LUE A/ROM blocking exercises for each digit, thumb flexion extension, followed by P/ROM composite flexion to digits individually and compositely. Pt will improved composite finger flexion end of session.  06/22/23- Pt brought in her splints, the splint for left hand is fitting well. Pt can benefit from a different splint for RUE that places index finger in greater extension. Therapist fabricated an index finger splint to  position PIP in extension and to address DIP deformity. Pt reports splint fits well A/ROM PIP blocking to digits of left and right hands, thumb flexion/ extension, opposition, min v.c and min facilitation Pt brought in her AE bottle openers and pt practiced opening plastic bottle. Pt was issued additional foam grips for pens.    06/20/23 Today's therapy session was focused on fabrication of custom circumferential finger splints for bilateral index fingers to improve DIP positioning to minimize risk for additional deformity. 1 splint was fabicated for right index and 2 for left. Pt's second index finger splint also promotes finger extension at PIP joint.  Pt was instructed in splint wear, care and precautions. Pt. reports AE is helping at home. 06/14/23 paraffin x 10 mins to bilateral UE's Ice pack to Left shoulder x 10 mins for pain relief, no adverse reactions Reviewed PIP, DIP blocking exercises, finger thumb opposition and thumb flexion for bilateral UE's. Discussed recommendations for adapted strategies for ADLs including use of food processor to shred chicken. Pt reports using food processory for cutting vegetables and it worked well. Pt reports she purchased several items we discussed last visit and overall they are working well. Therapist began assessing splinting needs, oval 8's were tried but did not provide correct positioning, pt will benefit from custom finger splints in the future to improve DIP positioning.    06/06/22- Paraffin x 10 mins to bilateral UE's for pain and stiffness, no adverse reactions Reviewed A/ROM HEP issued last visit 5-10 reps each bilateral UE's. Discussions regarding activity modification and potential AE including opening bottles/ jars, and use of food processor for chopping vegatables instead of chopping with a knife. Pt was shown some options on the internet. Discussed importance of warming up before activity with paraffin or gentle A/ROM   05/30/23- Paraffin  x 10 mins to bilateral UE's for pain and stiffness, then pt was instucted in A/ROM HEP. See pt. instructions. Therapist began discussing activity modifications and AE. Pt  was provided with a foam grip for increased ease with handwriting.  Pt tried using the sock aide however it was more challenging than donning the sock the traditional way.   05/24/23 eval only                                                                                                                                PATIENT EDUCATION: Education details: see above Person educated: Patient Education method: Explanation, demonstration,  Education comprehension: verbalized understanding, returned demonstration  HOME EXERCISE PROGRAM: AROM  GOALS: Goals reviewed with patient? Yes  SHORT TERM GOALS: Target date: 06/24/23  I with HEP  Goal status:  met 06/07/23  2.  I with positioning/ splinting prn to minimize pain and defomity   Goal status: met, 07/13/23  3.  Pt will increase bilateral grip strength by 5 lbs for increased UE functional use. Pt will increase bilateral grip strength to at least 25 lbs (after stretching). Baseline: 05/30/23- RUE 8 lbs, LUE 4 lbs Goal status:met 08/01/23-R 20 lbs,L  19 lbs, continue to address upgraded goal.    LONG TERM GOALS: Target date: 08/16/23  I with updated HEP  Goal status: ongoing  2.  Pt will improve Quick Dash score to 70% disability Baseline: 75% (05/30/23) Goal status:  ongoing  3.  Pt will demonstrate at least 65% composte flexion for LUE for increased functional use.  Goal status: ongoing  4.  I with adapted strategies/ adapted equipment to minimize pain and to increase pt I with ADLs/IADLs  Goal status:  ongoing, pt educated in buttook and other adapted devices/ strategies for opening continers.07/13/23  5.  Pt will demonstrate at least 65% composite flexion for RUE for increased functional use.  Goal status:  ongoing    ASSESSMENT:  CLINICAL  IMPRESSION:Pt  is progressing towards goals with improved flexibility following [place]. Pt reports decreased thumb pain. Marland KitchenPERFORMANCE DEFICITS: in functional skills including ADLs, IADLs, coordination, sensation, edema, ROM, strength, pain, flexibility, Fine motor control, Gross motor control, endurance, decreased knowledge of precautions, decreased knowledge of use of DME, and UE functional use,, and psychosocial skills including coping strategies, environmental adaptation, habits, interpersonal interactions, and routines and behaviors.   IMPAIRMENTS: are limiting patient from ADLs, IADLs, rest and sleep, education, play, leisure, and social participation.   COMORBIDITIES: may have co-morbidities  that affects occupational performance. Patient will benefit from skilled OT to address above impairments and improve overall function.  MODIFICATION OR ASSISTANCE TO COMPLETE EVALUATION: No modification of tasks or assist necessary to complete an evaluation.  OT OCCUPATIONAL PROFILE AND HISTORY: Detailed assessment: Review of records and additional review of physical, cognitive, psychosocial history related to current functional performance.  CLINICAL DECISION MAKING: LOW - limited treatment options, no task modification necessary  REHAB POTENTIAL: Good  EVALUATION COMPLEXITY: Low      PLAN:  OT FREQUENCY: 2x/week plus eval  OT DURATION: 12 weeks  PLANNED INTERVENTIONS: 95284 OT Re-evaluation,  84132 self care/ADL training, 44010 therapeutic exercise, 97530 therapeutic activity, 97112 neuromuscular re-education, 97140 manual therapy, [id number] aquatic therapy, 97035 ultrasound, 97018 paraffin, 27253 fluidotherapy, 97010 moist heat, 97010 cryotherapy, 97034 contrast bath, 97760 Orthotics management and training, 66440 Splinting (initial encounter), [phone] Subsequent splinting/medication, passive range of motion, energy conservation, coping strategies training, patient/family education, and DME and/or AE  instructions  RECOMMENDED OTHER SERVICES: none  CONSULTED AND AGREED WITH PLAN OF CARE: Patient  PLAN FOR NEXT SESSION:US, ROM, AE   Murphy Bundick, OT 08/08/2023, 9:43 AM

## 2023-08-09 DIAGNOSIS — H18513 Endothelial corneal dystrophy, bilateral: Secondary | ICD-10-CM | POA: Diagnosis not present

## 2023-08-09 DIAGNOSIS — E119 Type 2 diabetes mellitus without complications: Secondary | ICD-10-CM | POA: Diagnosis not present

## 2023-08-09 DIAGNOSIS — H04123 Dry eye syndrome of bilateral lacrimal glands: Secondary | ICD-10-CM | POA: Diagnosis not present

## 2023-08-09 DIAGNOSIS — H35371 Puckering of macula, right eye: Secondary | ICD-10-CM | POA: Diagnosis not present

## 2023-08-09 DIAGNOSIS — H35033 Hypertensive retinopathy, bilateral: Secondary | ICD-10-CM | POA: Diagnosis not present

## 2023-08-10 ENCOUNTER — Ambulatory Visit: Admitting: Physical Therapy

## 2023-08-10 ENCOUNTER — Ambulatory Visit: Admitting: Occupational Therapy

## 2023-08-15 ENCOUNTER — Encounter: Payer: Self-pay | Admitting: Family

## 2023-08-15 ENCOUNTER — Other Ambulatory Visit (HOSPITAL_COMMUNITY): Payer: Self-pay | Admitting: Orthopedic Surgery

## 2023-08-15 ENCOUNTER — Ambulatory Visit: Admitting: Occupational Therapy

## 2023-08-15 ENCOUNTER — Encounter: Payer: Self-pay | Admitting: Occupational Therapy

## 2023-08-15 DIAGNOSIS — M25642 Stiffness of left hand, not elsewhere classified: Secondary | ICD-10-CM

## 2023-08-15 DIAGNOSIS — Z96611 Presence of right artificial shoulder joint: Secondary | ICD-10-CM

## 2023-08-15 DIAGNOSIS — M6281 Muscle weakness (generalized): Secondary | ICD-10-CM | POA: Diagnosis not present

## 2023-08-15 DIAGNOSIS — R278 Other lack of coordination: Secondary | ICD-10-CM

## 2023-08-15 DIAGNOSIS — M542 Cervicalgia: Secondary | ICD-10-CM | POA: Diagnosis not present

## 2023-08-15 DIAGNOSIS — R600 Localized edema: Secondary | ICD-10-CM | POA: Diagnosis not present

## 2023-08-15 DIAGNOSIS — M25511 Pain in right shoulder: Secondary | ICD-10-CM | POA: Diagnosis not present

## 2023-08-15 DIAGNOSIS — M79641 Pain in right hand: Secondary | ICD-10-CM

## 2023-08-15 DIAGNOSIS — M79642 Pain in left hand: Secondary | ICD-10-CM | POA: Diagnosis not present

## 2023-08-15 DIAGNOSIS — M25611 Stiffness of right shoulder, not elsewhere classified: Secondary | ICD-10-CM | POA: Diagnosis not present

## 2023-08-15 DIAGNOSIS — M25641 Stiffness of right hand, not elsewhere classified: Secondary | ICD-10-CM | POA: Diagnosis not present

## 2023-08-15 NOTE — Therapy (Signed)
 OUTPATIENT OCCUPATIONAL THERAPY ORTHO Treatment  Patient Name: Veronica Galvan MRN: [id number] DOB:Jan 06, 1956, 68 y.o., female Today's Date: 08/15/2023  PCP: Adra Alanis [name] REFERRING PROVIDER: Adra Alanis [name]  END OF SESSION:  OT End of Session - 08/15/23 1241     Visit Number 16    Number of Visits 41    Date for OT Re-Evaluation 11/07/23   POC written for 12 weeks however anticipate wrapping up sooner dependent on progress   Authorization Type [employer] M[employer]    Authorization Time Period 12 weeks    Authorization - Visit Number 16   renewal completed visit 16   Progress Note Due on Visit 26    OT Start Time 0805    OT Stop Time 0905    OT Time Calculation (min) 60 min    Activity Tolerance Patient tolerated treatment well    Behavior During Therapy WFL for tasks assessed/performed                    Past Medical History:  Diagnosis Date   Allergy    Anxiety    Arthritis    hands   Cataract    Diabetes mellitus without complication (HCC)    type 2   Family history of adverse reaction to anesthesia    son has malignant hyperthermia, daughter does not daughter recently had c section without problems   GERD (gastroesophageal reflux disease)    Headache    sinus   Hyperlipidemia    Hypertension    PONV (postoperative nausea and vomiting)    nausea only   Ulcer, stomach peptic yrs ago   Past Surgical History:  Procedure Laterality Date   APPENDECTOMY     both hells bone spur repair     both heels with metal clips   both shoulder rotator cuff repair     CESAREAN SECTION     x 1   COLONOSCOPY WITH PROPOFOL N/A 09/07/2016   Procedure: COLONOSCOPY WITH PROPOFOL;  Surgeon: Garrett Kallman, MD;  Location: [facility];  Service: Endoscopy;  Laterality: N/A;   colonscopy  06/2011   polyps   ELBOW FRACTURE SURGERY Left    EYE SURGERY     FRACTURE SURGERY     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/10/2022   Procedure: REVERSE SHOULDER  ARTHROPLASTY;  Surgeon: Ellard Gunning, MD;  Location: [facility];  Service: Orthopedics;  Laterality: Right;  Please follow in room 6 if able   TUBAL LIGATION     VESICO-VAGINAL FISTULA REPAIR     Patient Active Problem List   Diagnosis Date Noted   Gastroesophageal reflux disease 11/16/2021   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 08/18/2021   Dermatofibroma 08/18/2021   History of malignant neoplasm of skin 08/18/2021   Lentigo 08/18/2021   Melanocytic nevi of trunk 08/18/2021   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   Rosacea 08/18/2021   Actinic keratosis 08/18/2021   Sensorineural hearing loss (SNHL) of left ear with unrestricted hearing of right ear 07/26/2021   Tinnitus of left ear 07/26/2021   Acute recurrent maxillary sinusitis 06/22/2021   Primary hypertension 01/12/2021   Hyperlipidemia associated with type 2 diabetes mellitus (HCC) 01/12/2021   Arthritis 01/12/2021   Type II diabetes mellitus (HCC) 11/25/2019   Ingrown toenail 09/05/2018   Neck pain 01/29/2018   Tick bite 10/24/2017    ONSET DATE: 05/19/23  REFERRING DIAG:  Diagnosis  M79.641,M79.642 (ICD-10-CM) - Bilateral hand pain    THERAPY  DIAG:  Muscle weakness (generalized) - Plan: Ot plan of care cert/re-cert  Stiffness of right hand, not elsewhere classified - Plan: Ot plan of care cert/re-cert  Pain in right hand - Plan: Ot plan of care cert/re-cert  Pain in left hand - Plan: Ot plan of care cert/re-cert  Other lack of coordination - Plan: Ot plan of care cert/re-cert  Stiffness of left hand, not elsewhere classified - Plan: Ot plan of care cert/re-cert  Rationale for Evaluation and Treatment: Rehabilitation  SUBJECTIVE:   SUBJECTIVE STATEMENT: Pt reports her shoulders are really bothering her Pt accompanied by: self  PERTINENT HISTORY: 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 Yes: NPRS scale: 6/10- right hand, 3/10 left hand Pain location: right hand, left hand Pain description: aching, stiff Aggravating factors: gripping  Relieving factors: heat, meds Pt also has shoulder pain which is more significant grossly 8/10 10 which OT will not address. Pt is on hold from PT pending further testing  FALLS: Has patient fallen in last 6 months? No  LIVING ENVIRONMENT: Lives with: lives alone Lives in: House/apartment Stairs: yes   PLOF: Independent  PATIENT GOALS: improve functional use of hands and learn adapted strategies for ADLs/IADLs.  NEXT MD VISIT: unknown  OBJECTIVE:  Note: Objective measures were completed at Evaluation unless otherwise noted.  HAND DOMINANCE: Right  ADLs: Overall ADLs: unable to grip credit card to remove from ATM Transfers/ambulation related to ADLs: mod I Eating: drops silverware Grooming: drops toothbrush, uses electric toothbrush, holding comb Upper body dressing: adjusting bra Lower body dressing: difficulty pulling up pants  Toileting: hygeine was difficult initally Bathing: mod I, difficult with shaving Tub shower transfers: walk in shower    FUNCTIONAL OUTCOME MEASURES: Quick Dash: 05/30/23- 75%% disability Quick Dash 08/15/23- 70.5% disability   UPPER EXTREMITY ROM:   RUE wrist flexion/ extension: 70/ 50, LUE wrist flexion/ extension: 70/ 45 Pt demonstrates grossly 50% composite flexion for right and 555 with left.   Active ROM Right eval Left eval  Thumb MCP (0-60) 45 55  Thumb IP (0-80) 25 10  Thumb Radial abd/add (0-55)     Thumb Palmar abd/add (0-45)     Thumb Opposition to Small Finger yes yes   Index MCP (0-90)     Index PIP (0-100)     Index DIP (0-70)      Long MCP (0-90)      Long PIP (0-100)      Long DIP (0-70)      Ring MCP (0-90)       Ring PIP (0-100)      Ring DIP (0-70)      Little MCP (0-90)      Little PIP (0-100)      Little DIP (0-70)      (Blank rows = not tested) 9 hole peg test (08/15/23) RUE 26.70 secs, LUE 23 secs  HAND FUNCTION: Grip strength: Right: 8 lbs; Left: 4 lbs,          08/15/23-  RUE 18, LUE 14 lbs,           08/15/23   Pinch: RUE tip 4lbs, lateral 8 lbs, LUE tip 3 lbs, lateral 8 lbs   SENSATION: Light touch: Impaired index finger LUE  EDEMA: Pt with bony  defomities at PIP joints of all digits which pt reports are bone spurs, Pt also has bony deformity at DIP for right thumb, index and 5th digit and LUE small and index fingers  COGNITION: Overall cognitive status: Within functional limits for tasks assessed   OBSERVATIONS: Pleasant female desires to increase functional use of bilateral UE's   TREATMENT DATE: 4/15/25Paraffin to RUE x 10 mins, no adverse reactions, while therapist performed US to LUE 3.3 mhz, 0.8 w/cm 2,  20%x 8 mins no adverse reactions for stiffness. Paraffin to LUE x 10 mins, no adverse reactions, while therapist performed US to RUE 3.3 mhz, 0.8 w/cm 2,  20%x 8 mins no adverse reactions for stiffness.Gentle PIP blocking and passive ROM individual and composite finger flexion followed by finger  thumb flexion bilateral UE's. Therapist checked progress towards goals and took updated measurements then discussed plans for renewal and updated goals. See goals   08/08/23-Paraffin to RUE x 10 mins, no adverse reactions, while therapist performed US to LUE 3.3 mhz, 0.8 w/cm 2,  20%x 8 mins no adverse reactions for stiffness. Therapist discussed possible modifcations for eating with pt such as holding fork or spoon between digits, raising height of what she is eating by placing on a book, supporting right elbow with a pillow. Paraffin to LUE x 10 mins, no adverse reactions, while therapist performed US to RUE 3.3 mhz, 0.8 w/cm 2,  20%x 8 mins no adverse reactions for stiffness.Gentle  PIP blocking and passive ROM individual and composite finger flexion followed by finger  thumb flexion bilateral UE's. Pt reports hands are less painful at end of session.  08/03/23-Paraffin to RUE x 10 mins, no adverse reactions, while therapist performed US to LUE 3.3 mhz, 0.8 w/cm 2,  20%x 8 mins no adverse reactions for stiffness. Paraffin to LUE x 10 mins, no adverse reactions, while therapist performed US to RUE 3.3 mhz, 0.8 w/cm 2,  20%x 8 mins no adverse reactions for stiffness. Gentle PIP blocking and passive ROM composite finger flexion followed by finger thumb opposition and thumb flexion bilateral UE's. Pt with decreased pain and improved flexibility after [place] today.   08/01/23 Paraffin to left hand x 10 mins while pt performed A/ROM  for RUE: blocking exercises followed by P/ROM composite flexion to digits individually and compositely, A/ROM thumb flexion/ extension and finger thumb opposition. Paraffin to RUE x 10 mins  while LUE A/ROM blocking exercises followed by P/ROM composite flexion to digits individually and compositely, finger thumb opposition and thumb flexion. Yelllow putty exercises for sustained grip and pinch for bilateral UE's min v.c individual tip pinch for digits with exception of middle digit right hand. Pt rpeorts using her food processor to cut vegetables for soup. Pt to bring in her pick next visit for adaptation.                                                                               PATIENT EDUCATION: Education details: see above Person educated: Patient Education method: Explanation, demonstration,  Education comprehension: verbalized understanding, returned demonstration  HOME EXERCISE PROGRAM: AROM  GOALS: Goals reviewed with patient? Yes  SHORT TERM GOALS: Target date: 09/14/23  I with HEP  Goal status:  met 06/07/23  2.  I with positioning/ splinting prn to minimize pain and defomity   Goal status: met, 07/13/23  3.  Pt will increase  bilateral grip strength by 5 lbs for increased UE functional use. Upgraded goal Pt will increase bilateral grip strength to at least 25 lbs (after stretching). Baseline: 05/30/23- RUE 8 lbs, LUE 4 lbs Goal status:met 08/01/23-R 20 lbs, L  19 lbs,  08/15/23- RUE 18 lbsLUE 14 lbs continue to address upgraded goal.  4. Pt will demonstrate improved RUE fine motor coordiantion as evidenced by perfroming 9 hole peg test in 23 secs or less.  Goal status: new 5. I with updates to HEP.  Goal status: new 6. Pt will demonstrate improved composite finger flexion for ADLs as evidenced by pt. bringing middle fingertip for RUE within 1/2 an inch of palm.  Baseline: 3/4 inch from palm to middle fingertip  Goal status : new 7.Pt will demonstrate improved composite finger flexion for ADLs as evidenced by pt. bringing middle fingertip for LUE within 3/4 an inch of palm.  Baseline: 1 inch from palm to middle finger tip  Goal status: new  LONG TERM GOALS: Target date:11/07/23  I with updated HEP  Goal status:met 08/15/22  2.  Pt will improve Quick Dash score to 70% disability Baseline: 75% (05/30/23) Goal status:   improved - however not fully met due to complications from shoulder pain-70.5%08/15/23  3.  Pt will demonstrate at least 65% composte flexion for LUE for increased functional use.  Goal status: met 65-70% , 08/15/23  4.  I with adapted strategies/ adapted equipment to minimize pain and to increase pt I with ADLs/IADLs  Goal status:  met for inital strategies, however continue goal as pt may report additional tasks that can benefit from modification.08/15/23  5.  Pt will demonstrate at least 65% composite flexion for RUE for increased functional use.  Goal status: met 70% 08/15/23  6. Pt will report that she is able to cut her meat such as steak or chicken modified independently.  [place]: unable  Goal status: new    7. Pt will report increased ease with opening/ closing ziplock bags.    Goal  status: new    8. Pt will improve bilateral tip pinch by 2 lbs for increased ease with daily activities.     Goal status: new    9. Pt will improve bilateral lateral pinch by 2 lbs for increased ease with ADLs.    Goal status: new    ASSESSMENT:  CLINICAL IMPRESSION: For the reporting period of:07/13/23-08/15/23, Pt has made overall progress. She has achieved 3/3 inital Short term goals and 5/5 long term goals. Pt can benefit from skilled occupational therapy to address bilateral hand stiffness, pain, coordination and decreased strength in order to maximize pt's bilateral UE functional use for ADLs/IADLs. Pt remains limited by bilateral hand and shoulder pain which is impacting UE function. She has been receiving PT to address her shoulder. Pt's PT has been place on hold pending further testing. PERFORMANCE DEFICITS: in functional skills including ADLs, IADLs, coordination, sensation, edema, ROM, strength, pain, flexibility, Fine motor control, Gross motor control, endurance, decreased knowledge of precautions, decreased knowledge of use of DME, and UE functional use,, and psychosocial skills including coping strategies, environmental adaptation, habits, interpersonal interactions, and routines and behaviors.   IMPAIRMENTS: are limiting patient from ADLs, IADLs, rest and sleep, education, play, leisure, and social participation.   COMORBIDITIES: may have co-morbidities  that  affects occupational performance. Patient will benefit from skilled OT to address above impairments and improve overall function.  MODIFICATION OR ASSISTANCE TO COMPLETE EVALUATION: No modification of tasks or assist necessary to complete an evaluation.  OT OCCUPATIONAL PROFILE AND HISTORY: Detailed assessment: Review of records and additional review of physical, cognitive, psychosocial history related to current functional performance.  CLINICAL DECISION MAKING: LOW - limited treatment options, no task modification  necessary  REHAB POTENTIAL: Good  EVALUATION COMPLEXITY: Low      PLAN:  OT FREQUENCY: 2x/week  OT DURATION: 12 weeks( anticipate d/c after 8 weeks dependent on progress)  PLANNED INTERVENTIONS: 40981 OT Re-evaluation, 97535 self care/ADL training, 19147 therapeutic exercise, 97530 therapeutic activity, 97112 neuromuscular re-education, 97140 manual therapy, 97113 aquatic therapy, 97035 ultrasound, 97018 paraffin, 82956 fluidotherapy, 97010 moist heat, 97010 cryotherapy, 97034 contrast bath, 97760 Orthotics management and training, 21308 Splinting (initial encounter), [id number] Subsequent splinting/medication, passive range of motion, energy conservation, coping strategies training, patient/family education, and DME and/or AE instructions  RECOMMENDED OTHER SERVICES: none  CONSULTED AND AGREED WITH PLAN OF CARE: Patient  PLAN FOR NEXT SESSION: work towards updated goals   Hettie Roselli, OT 08/15/2023, 3:04 PM

## 2023-08-16 ENCOUNTER — Other Ambulatory Visit: Payer: Self-pay | Admitting: Family

## 2023-08-16 MED ORDER — ALPRAZOLAM 0.5 MG PO TABS: 0.5000 mg | ORAL_TABLET | Freq: Every evening | ORAL | 0 refills | Status: AC | PRN

## 2023-08-17 ENCOUNTER — Ambulatory Visit: Admitting: Occupational Therapy

## 2023-08-17 DIAGNOSIS — M79642 Pain in left hand: Secondary | ICD-10-CM

## 2023-08-17 DIAGNOSIS — R600 Localized edema: Secondary | ICD-10-CM | POA: Diagnosis not present

## 2023-08-17 DIAGNOSIS — M25641 Stiffness of right hand, not elsewhere classified: Secondary | ICD-10-CM | POA: Diagnosis not present

## 2023-08-17 DIAGNOSIS — M79641 Pain in right hand: Secondary | ICD-10-CM

## 2023-08-17 DIAGNOSIS — R278 Other lack of coordination: Secondary | ICD-10-CM | POA: Diagnosis not present

## 2023-08-17 DIAGNOSIS — M542 Cervicalgia: Secondary | ICD-10-CM | POA: Diagnosis not present

## 2023-08-17 DIAGNOSIS — M6281 Muscle weakness (generalized): Secondary | ICD-10-CM | POA: Diagnosis not present

## 2023-08-17 DIAGNOSIS — M25511 Pain in right shoulder: Secondary | ICD-10-CM | POA: Diagnosis not present

## 2023-08-17 DIAGNOSIS — M25642 Stiffness of left hand, not elsewhere classified: Secondary | ICD-10-CM

## 2023-08-17 DIAGNOSIS — M25611 Stiffness of right shoulder, not elsewhere classified: Secondary | ICD-10-CM | POA: Diagnosis not present

## 2023-08-17 NOTE — Therapy (Addendum)
 OUTPATIENT OCCUPATIONAL THERAPY ORTHO Treatment  Patient Name: Veronica Galvan MRN: [id number] DOB:June 11, 1955, 68 y.o., female Today's Date: 08/17/2023  PCP: Adra Alanis [name] REFERRING PROVIDER: Adra Alanis [name]  END OF SESSION:  OT End of Session - 08/17/23 0954     Visit Number 17    Number of Visits 41    Date for OT Re-Evaluation 11/07/23    Authorization Type [employer] M[employer]    Authorization Time Period 12 weeks    Authorization - Visit Number 17    Progress Note Due on Visit 26    OT Start Time 0803    OT Stop Time 0850    OT Time Calculation (min) 47 min                    Past Medical History:  Diagnosis Date   Allergy     Anxiety    Arthritis    hands   Cataract    Diabetes mellitus without complication (HCC)    type 2   Family history of adverse reaction to anesthesia    son has malignant hyperthermia, daughter does not daughter recently had c section without problems   GERD (gastroesophageal reflux disease)    Headache    sinus   Hyperlipidemia    Hypertension    PONV (postoperative nausea and vomiting)    nausea only   Ulcer, stomach peptic yrs ago   Past Surgical History:  Procedure Laterality Date   APPENDECTOMY     both hells bone spur repair     both heels with metal clips   both shoulder rotator cuff repair     CESAREAN SECTION     x 1   COLONOSCOPY WITH PROPOFOL  N/A 09/07/2016   Procedure: COLONOSCOPY WITH PROPOFOL ;  Surgeon: Garrett Kallman, MD;  Location: [facility];  Service: Endoscopy;  Laterality: N/A;   colonscopy  06/2011   polyps   ELBOW FRACTURE SURGERY Left    EYE SURGERY     FRACTURE SURGERY     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/10/2022   Procedure: REVERSE SHOULDER ARTHROPLASTY;  Surgeon: Ellard Gunning, MD;  Location: [facility];  Service: Orthopedics;  Laterality: Right;  Please follow in room 6 if able   TUBAL LIGATION     VESICO-VAGINAL FISTULA REPAIR     Patient Active Problem List    Diagnosis Date Noted   Gastroesophageal reflux disease 11/16/2021   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 08/18/2021   Dermatofibroma 08/18/2021   History of malignant neoplasm of skin 08/18/2021   Lentigo 08/18/2021   Melanocytic nevi of trunk 08/18/2021   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   Rosacea 08/18/2021   Actinic keratosis 08/18/2021   Sensorineural hearing loss (SNHL) of left ear with unrestricted hearing of right ear 07/26/2021   Tinnitus of left ear 07/26/2021   Acute recurrent maxillary sinusitis 06/22/2021   Primary hypertension 01/12/2021   Hyperlipidemia associated with type 2 diabetes mellitus (HCC) 01/12/2021   Arthritis 01/12/2021   Type II diabetes mellitus (HCC) 11/25/2019   Ingrown toenail 09/05/2018   Neck pain 01/29/2018   Tick bite 10/24/2017    ONSET DATE: 05/19/23  REFERRING DIAG:  Diagnosis  M79.641,M79.642 (ICD-10-CM) - Bilateral hand pain    THERAPY DIAG:  Muscle weakness (generalized)  Stiffness of right hand, not elsewhere classified  Pain in right hand  Pain in left hand  Other lack of coordination  Stiffness of left hand, not elsewhere classified  Rationale  for Evaluation and Treatment: Rehabilitation  SUBJECTIVE:   SUBJECTIVE STATEMENT: Pt reports her shoulders are really bothering her Pt accompanied by: self  PERTINENT HISTORY: 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 Yes: NPRS scale: 6/10- right hand, 3/10 left hand Pain location: right hand, left hand Pain description: aching, stiff Aggravating factors: gripping  Relieving factors: heat, meds Pt also has shoulder pain which is more significant grossly 8/10 10 which OT will not address. Pt is on hold  from PT pending further testing  FALLS: Has patient fallen in last 6 months? No  LIVING ENVIRONMENT: Lives with: lives alone Lives in: House/apartment Stairs: yes   PLOF: Independent  PATIENT GOALS: improve functional use of hands and learn adapted strategies for ADLs/IADLs.  NEXT MD VISIT: unknown  OBJECTIVE:  Note: Objective measures were completed at Evaluation unless otherwise noted.  HAND DOMINANCE: Right  ADLs: Overall ADLs: unable to grip credit card to remove from ATM Transfers/ambulation related to ADLs: mod I Eating: drops silverware Grooming: drops toothbrush, uses electric toothbrush, holding comb Upper body dressing: adjusting bra Lower body dressing: difficulty pulling up pants  Toileting: hygeine was difficult initally Bathing: mod I, difficult with shaving Tub shower transfers: walk in shower    FUNCTIONAL OUTCOME MEASURES: Quick Dash: 05/30/23- 75%% disability Quick Dash 08/15/23- 70.5% disability   UPPER EXTREMITY ROM:   RUE wrist flexion/ extension: 70/ 50, LUE wrist flexion/ extension: 70/ 45 Pt demonstrates grossly 50% composite flexion for right and 555 with left.   Active ROM Right eval Left eval  Thumb MCP (0-60) 45 55  Thumb IP (0-80) 25 10  Thumb Radial abd/add (0-55)     Thumb Palmar abd/add (0-45)     Thumb Opposition to Small Finger yes yes   Index MCP (0-90)     Index PIP (0-100)     Index DIP (0-70)      Long MCP (0-90)      Long PIP (0-100)      Long DIP (0-70)      Ring MCP (0-90)      Ring PIP (0-100)      Ring DIP (0-70)      Little MCP (0-90)      Little PIP (0-100)      Little DIP (0-70)      (Blank rows = not tested) 9 hole peg test (08/15/23) RUE 26.70 secs, LUE 23 secs  HAND FUNCTION: Grip strength: Right: 8 lbs; Left: 4 lbs,          08/15/23-  RUE 18, LUE 14 lbs,           08/15/23   Pinch: RUE tip 4lbs, lateral 8 lbs, LUE tip 3 lbs, lateral 8 lbs   SENSATION: Light touch: Impaired index finger LUE  EDEMA:  Pt with bony defomities at PIP joints of all digits which pt reports are bone spurs, Pt also has bony deformity at DIP for right thumb, index and 5th digit and LUE small and index fingers  COGNITION: Overall cognitive status: Within functional limits for tasks assessed   OBSERVATIONS: Pleasant female desires to increase functional use of bilateral UE's   TREATMENT DATE: 08/17/23 Paraffin to RUE x 10 mins, no adverse reactions, while therapist performed US  to LUE 3.3 mhz, 0.8  w/cm 2,  20%x 8 mins no adverse reactions for stiffness. Paraffin to LUE x 10 mins, no adverse reactions, while therapist performed US  to RUE 3.3 mhz, 0.8 w/cm 2,  20%x 8 mins no adverse reactions for stiffness.  Education regarding strategies fo cutting food, with pt simulating using knife and fork with max difficulty. Pt was shown rocker knife and she as able to use sucessfully following instruction. Pt was provided with information for purchase. Gentle composite finger passive ROM to bilateral hands.    4/15/25Paraffin to RUE x 10 mins, no adverse reactions, while therapist performed US  to LUE 3.3 mhz, 0.8 w/cm 2,  20%x 8 mins no adverse reactions for stiffness. Paraffin to LUE x 10 mins, no adverse reactions, while therapist performed US  to RUE 3.3 mhz, 0.8 w/cm 2,  20%x 8 mins no adverse reactions for stiffness.Gentle PIP blocking and passive ROM individual and composite finger flexion followed by finger  thumb flexion bilateral UE's. Therapist checked progress towards goals and took updated measurements then discussed plans for renewal and updated goals. See goals   08/08/23-Paraffin to RUE x 10 mins, no adverse reactions, while therapist performed US  to LUE 3.3 mhz, 0.8 w/cm 2,  20%x 8 mins no adverse reactions for stiffness. Therapist discussed possible modifcations for eating with pt such as holding fork or spoon between digits, raising height of what she is eating by placing on a book, supporting right elbow with a  pillow. Paraffin to LUE x 10 mins, no adverse reactions, while therapist performed US  to RUE 3.3 mhz, 0.8 w/cm 2,  20%x 8 mins no adverse reactions for stiffness.Gentle PIP blocking and passive ROM individual and composite finger flexion followed by finger  thumb flexion bilateral UE's. Pt reports hands are less painful at end of session.  08/03/23-Paraffin to RUE x 10 mins, no adverse reactions, while therapist performed US  to LUE 3.3 mhz, 0.8 w/cm 2,  20%x 8 mins no adverse reactions for stiffness. Paraffin to LUE x 10 mins, no adverse reactions, while therapist performed US  to RUE 3.3 mhz, 0.8 w/cm 2,  20%x 8 mins no adverse reactions for stiffness. Gentle PIP blocking and passive ROM composite finger flexion followed by finger thumb opposition and thumb flexion bilateral UE's. Pt with decreased pain and improved flexibility after US  today.   08/01/23 Paraffin to left hand x 10 mins while pt performed A/ROM  for RUE: blocking exercises followed by P/ROM composite flexion to digits individually and compositely, A/ROM thumb flexion/ extension and finger thumb opposition. Paraffin to RUE x 10 mins  while LUE A/ROM blocking exercises followed by P/ROM composite flexion to digits individually and compositely, finger thumb opposition and thumb flexion. Yelllow putty exercises for sustained grip and pinch for bilateral UE's min v.c individual tip pinch for digits with exception of middle digit right hand. Pt rpeorts using her food processor to cut vegetables for soup. Pt to bring in her pick next visit for adaptation.                                                                               PATIENT EDUCATION: Education details:  use of Clinical biochemist Person educated: Patient Education method: Explanation, demonstration,  Education comprehension: verbalized understanding, returned demonstration  HOME EXERCISE PROGRAM: AROM yellow putty GOALS: Goals reviewed with patient? Yes  SHORT TERM GOALS:  Target date: 09/14/23  I with HEP  Goal status:  met 06/07/23  2.  I with positioning/ splinting prn to minimize pain and defomity   Goal status: met, 07/13/23  3.  Pt will increase bilateral grip strength by 5 lbs for increased UE functional use. Upgraded goal Pt will increase bilateral grip strength to at least 25 lbs (after stretching). Baseline: 05/30/23- RUE 8 lbs, LUE 4 lbs Goal status:met 08/01/23-R 20 lbs, L  19 lbs,  08/15/23- RUE 18 lbsLUE 14 lbs continue to address upgraded goal.  4. Pt will demonstrate improved RUE fine motor coordiantion as evidenced by perfroming 9 hole peg test in 23 secs or less.  Goal status: new 5. I with updates to HEP.  Goal status: new 6. Pt will demonstrate improved composite finger flexion for ADLs as evidenced by pt. bringing middle fingertip for RUE within 1/2 an inch of palm.  Baseline: 3/4 inch from palm to middle fingertip  Goal status : new 7.Pt will demonstrate improved composite finger flexion for ADLs as evidenced by pt. bringing middle fingertip for LUE within 3/4 an inch of palm.  Baseline: 1 inch from palm to middle finger tip  Goal status: new  LONG TERM GOALS: Target date:11/07/23  I with updated HEP  Goal status:met 08/15/22  2.  Pt will improve Quick Dash score to 70% disability Baseline: 75% (05/30/23) Goal status:   improved - however not fully met due to complications from shoulder pain-70.5%08/15/23  3.  Pt will demonstrate at least 65% composte flexion for LUE for increased functional use.  Goal status: met 65-70% , 08/15/23  4.  I with adapted strategies/ adapted equipment to minimize pain and to increase pt I with ADLs/IADLs  Goal status:  met for inital strategies, however continue goal as pt may report additional tasks that can benefit from modification.08/15/23  5.  Pt will demonstrate at least 65% composite flexion for RUE for increased functional use.  Goal status: met 70% 08/15/23  6. Pt will report that she is able  to cut her meat such as steak or chicken modified independently.  [place]: unable  Goal status: new    7. Pt will report increased ease with opening/ closing ziplock bags.    Goal status: new    8. Pt will improve bilateral tip pinch by 2 lbs for increased ease with daily activities.     Goal status: new    9. Pt will improve bilateral lateral pinch by 2 lbs for increased ease with ADLs.    Goal status: new    ASSESSMENT:  CLINICAL IMPRESSION: For the reporting period of:07/13/23-08/15/23, Pt has made overall progress. She has achieved 3/3 inital Short term goals and 5/5 long term goals. Pt can benefit from skilled occupational therapy to address bilateral hand stiffness, pain, coordination and decreased strength in order to maximize pt's bilateral UE functional use for ADLs/IADLs. Pt remains limited by bilateral hand and shoulder pain which is impacting UE function. She has been receiving PT to address her shoulder. Pt's PT has been place on hold pending further testing. PERFORMANCE DEFICITS: in functional skills including ADLs, IADLs, coordination, sensation, edema, ROM, strength, pain, flexibility, Fine motor control, Gross motor control, endurance, decreased knowledge of precautions, decreased knowledge of use of DME, and UE functional use,, and psychosocial skills including coping strategies, environmental adaptation, habits, interpersonal  interactions, and routines and behaviors.   IMPAIRMENTS: are limiting patient from ADLs, IADLs, rest and sleep, education, play, leisure, and social participation.   COMORBIDITIES: may have co-morbidities  that affects occupational performance. Patient will benefit from skilled OT to address above impairments and improve overall function.  MODIFICATION OR ASSISTANCE TO COMPLETE EVALUATION: No modification of tasks or assist necessary to complete an evaluation.  OT OCCUPATIONAL PROFILE AND HISTORY: Detailed assessment: Review of records and additional  review of physical, cognitive, psychosocial history related to current functional performance.  CLINICAL DECISION MAKING: LOW - limited treatment options, no task modification necessary  REHAB POTENTIAL: Good  EVALUATION COMPLEXITY: Low      PLAN:  OT FREQUENCY: 2x/week  OT DURATION: 12 weeks( anticipate d/c after 8 weeks dependent on progress)  PLANNED INTERVENTIONS: 40981 OT Re-evaluation, 97535 self care/ADL training, 19147 therapeutic exercise, 97530 therapeutic activity, 97112 neuromuscular re-education, 97140 manual therapy, 97113 aquatic therapy, 97035 ultrasound, 97018 paraffin, 82956 fluidotherapy, 97010 moist heat, 97010 cryotherapy, 97034 contrast bath, 97760 Orthotics management and training, 21308 Splinting (initial encounter), [id number] Subsequent splinting/medication, passive range of motion, energy conservation, coping strategies training, patient/family education, and DME and/or AE instructions  RECOMMENDED OTHER SERVICES: none  CONSULTED AND AGREED WITH PLAN OF CARE: Patient  PLAN FOR NEXT SESSION: work towards updated goals   Shyah Cadmus, OT 08/17/2023, 10:00 AM

## 2023-08-18 ENCOUNTER — Encounter (HOSPITAL_COMMUNITY)
Admission: RE | Admit: 2023-08-18 | Discharge: 2023-08-18 | Disposition: A | Discharge status: Home / Self Care | Source: Ambulatory Visit | Attending: Orthopedic Surgery | Admitting: Orthopedic Surgery

## 2023-08-18 DIAGNOSIS — G8929 Other chronic pain: Secondary | ICD-10-CM | POA: Diagnosis not present

## 2023-08-18 DIAGNOSIS — M25511 Pain in right shoulder: Secondary | ICD-10-CM | POA: Diagnosis not present

## 2023-08-18 DIAGNOSIS — Z96611 Presence of right artificial shoulder joint: Secondary | ICD-10-CM | POA: Insufficient documentation

## 2023-08-18 MED ORDER — TECHNETIUM TC 99M MEDRONATE IV KIT
20.0000 | PACK | Freq: Once | INTRAVENOUS | Status: AC | PRN
  Administered 2023-08-18: 21.2 via INTRAVENOUS

## 2023-08-21 ENCOUNTER — Ambulatory Visit: Admitting: Occupational Therapy

## 2023-08-21 DIAGNOSIS — M79641 Pain in right hand: Secondary | ICD-10-CM | POA: Diagnosis not present

## 2023-08-21 DIAGNOSIS — R600 Localized edema: Secondary | ICD-10-CM | POA: Diagnosis not present

## 2023-08-21 DIAGNOSIS — M25641 Stiffness of right hand, not elsewhere classified: Secondary | ICD-10-CM

## 2023-08-21 DIAGNOSIS — M25511 Pain in right shoulder: Secondary | ICD-10-CM | POA: Diagnosis not present

## 2023-08-21 DIAGNOSIS — M25642 Stiffness of left hand, not elsewhere classified: Secondary | ICD-10-CM

## 2023-08-21 DIAGNOSIS — M79642 Pain in left hand: Secondary | ICD-10-CM | POA: Diagnosis not present

## 2023-08-21 DIAGNOSIS — R278 Other lack of coordination: Secondary | ICD-10-CM | POA: Diagnosis not present

## 2023-08-21 DIAGNOSIS — M6281 Muscle weakness (generalized): Secondary | ICD-10-CM

## 2023-08-21 DIAGNOSIS — M25611 Stiffness of right shoulder, not elsewhere classified: Secondary | ICD-10-CM | POA: Diagnosis not present

## 2023-08-21 DIAGNOSIS — M542 Cervicalgia: Secondary | ICD-10-CM | POA: Diagnosis not present

## 2023-08-21 NOTE — Therapy (Signed)
 OUTPATIENT OCCUPATIONAL THERAPY ORTHO Treatment  Patient Name: Veronica Galvan MRN: [id number] DOB:1955-07-09, 68 y.o., female Today's Date: 08/21/2023  PCP: Adra Alanis [name] REFERRING PROVIDER: Adra Alanis [name]  END OF SESSION:  OT End of Session - 08/21/23 1404     Visit Number 18    Number of Visits 41    Date for OT Re-Evaluation 11/07/23    Authorization Type [employer] MCR    Authorization Time Period 12 weeks    Authorization - Visit Number 18    Progress Note Due on Visit 26    OT Start Time 1307    OT Stop Time 1357    OT Time Calculation (min) 50 min                     Past Medical History:  Diagnosis Date   Allergy     Anxiety    Arthritis    hands   Cataract    Diabetes mellitus without complication (HCC)    type 2   Family history of adverse reaction to anesthesia    son has malignant hyperthermia, daughter does not daughter recently had c section without problems   GERD (gastroesophageal reflux disease)    Headache    sinus   Hyperlipidemia    Hypertension    PONV (postoperative nausea and vomiting)    nausea only   Ulcer, stomach peptic yrs ago   Past Surgical History:  Procedure Laterality Date   APPENDECTOMY     both hells bone spur repair     both heels with metal clips   both shoulder rotator cuff repair     CESAREAN SECTION     x 1   COLONOSCOPY WITH PROPOFOL  N/A 09/07/2016   Procedure: COLONOSCOPY WITH PROPOFOL ;  Surgeon: Garrett Kallman, MD;  Location: [facility];  Service: Endoscopy;  Laterality: N/A;   colonscopy  06/2011   polyps   ELBOW FRACTURE SURGERY Left    EYE SURGERY     FRACTURE SURGERY     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/10/2022   Procedure: REVERSE SHOULDER ARTHROPLASTY;  Surgeon: Ellard Gunning, MD;  Location: [facility];  Service: Orthopedics;  Laterality: Right;  Please follow in room 6 if able   TUBAL LIGATION     VESICO-VAGINAL FISTULA REPAIR     Patient Active Problem List    Diagnosis Date Noted   Gastroesophageal reflux disease 11/16/2021   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 08/18/2021   Dermatofibroma 08/18/2021   History of malignant neoplasm of skin 08/18/2021   Lentigo 08/18/2021   Melanocytic nevi of trunk 08/18/2021   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   Rosacea 08/18/2021   Actinic keratosis 08/18/2021   Sensorineural hearing loss (SNHL) of left ear with unrestricted hearing of right ear 07/26/2021   Tinnitus of left ear 07/26/2021   Acute recurrent maxillary sinusitis 06/22/2021   Primary hypertension 01/12/2021   Hyperlipidemia associated with type 2 diabetes mellitus (HCC) 01/12/2021   Arthritis 01/12/2021   Type II diabetes mellitus (HCC) 11/25/2019   Ingrown toenail 09/05/2018   Neck pain 01/29/2018   Tick bite 10/24/2017    ONSET DATE: 05/19/23  REFERRING DIAG:  Diagnosis  M79.641,M79.642 (ICD-10-CM) - Bilateral hand pain    THERAPY DIAG:  Muscle weakness (generalized)  Stiffness of right hand, not elsewhere classified  Pain in right hand  Pain in left hand  Other lack of coordination  Stiffness of left hand, not elsewhere classified  Rationale for Evaluation and Treatment: Rehabilitation  SUBJECTIVE:   SUBJECTIVE STATEMENT: Pt reports her shoulders are really bothering her Pt accompanied by: self  PERTINENT HISTORY: 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 Yes: NPRS scale: 6/10- right hand, 3-4/10 left hand Pain location: right hand, left hand Pain description: aching, stiff Aggravating factors: gripping  Relieving factors: heat, meds Pt also has shoulder pain which is more significant grossly 8/10 10 which OT will not address. Pt is on  hold from PT pending further testing  FALLS: Has patient fallen in last 6 months? No  LIVING ENVIRONMENT: Lives with: lives alone Lives in: House/apartment Stairs: yes   PLOF: Independent  PATIENT GOALS: improve functional use of hands and learn adapted strategies for ADLs/IADLs.  NEXT MD VISIT: unknown  OBJECTIVE:  Note: Objective measures were completed at Evaluation unless otherwise noted.  HAND DOMINANCE: Right  ADLs: Overall ADLs: unable to grip credit card to remove from ATM Transfers/ambulation related to ADLs: mod I Eating: drops silverware Grooming: drops toothbrush, uses electric toothbrush, holding comb Upper body dressing: adjusting bra Lower body dressing: difficulty pulling up pants  Toileting: hygeine was difficult initally Bathing: mod I, difficult with shaving Tub shower transfers: walk in shower    FUNCTIONAL OUTCOME MEASURES: Quick Dash: 05/30/23- 75%% disability Quick Dash 08/15/23- 70.5% disability   UPPER EXTREMITY ROM:   RUE wrist flexion/ extension: 70/ 50, LUE wrist flexion/ extension: 70/ 45 Pt demonstrates grossly 50% composite flexion for right and 555 with left.   Active ROM Right eval Left eval  Thumb MCP (0-60) 45 55  Thumb IP (0-80) 25 10  Thumb Radial abd/add (0-55)     Thumb Palmar abd/add (0-45)     Thumb Opposition to Small Finger yes yes   Index MCP (0-90)     Index PIP (0-100)     Index DIP (0-70)      Long MCP (0-90)      Long PIP (0-100)      Long DIP (0-70)      Ring MCP (0-90)      Ring PIP (0-100)      Ring DIP (0-70)      Little MCP (0-90)      Little PIP (0-100)      Little DIP (0-70)      (Blank rows = not tested) 9 hole peg test (08/15/23) RUE 26.70 secs, LUE 23 secs  HAND FUNCTION: Grip strength: Right: 8 lbs; Left: 4 lbs,          08/15/23-  RUE 18, LUE 14 lbs,           08/15/23   Pinch: RUE tip 4lbs, lateral 8 lbs, LUE tip 3 lbs, lateral 8 lbs   SENSATION: Light touch: Impaired index finger  LUE  EDEMA: Pt with bony defomities at PIP joints of all digits which pt reports are bone spurs, Pt also has bony deformity at DIP for right thumb, index and 5th digit and LUE small and index fingers  COGNITION: Overall cognitive status: Within functional limits for tasks assessed   OBSERVATIONS: Pleasant female desires to increase functional use of bilateral UE's   TREATMENT DATE: 08/21/23-Paraffin to RUE x 10 mins, no adverse reactions, while therapist performed US  to LUE 3.3 mhz, 0.8  w/cm 2,  20%x 8 mins no adverse reactions for stiffness. Paraffin to LUE x 10 mins, no adverse reactions, while therapist performed US  to RUE 3.3 mhz, 0.8 w/cm 2,  20%x 8 mins no adverse reactions for stiffness.  Composite finger passive P/ROM to bilateral hands, as well as PIP blocking for index and ring fingers, a/RPM individual finger extension with right and left UE's Digi flex light resistance for individual resisted finger flexion, then composite finger flexion for bilateral UE's  08/17/23 Paraffin to RUE x 10 mins, no adverse reactions, while therapist performed US  to LUE 3.3 mhz, 0.8 w/cm 2,  20%x 8 mins no adverse reactions for stiffness. Paraffin to LUE x 10 mins, no adverse reactions, while therapist performed US  to RUE 3.3 mhz, 0.8 w/cm 2,  20%x 8 mins no adverse reactions for stiffness.  Education regarding strategies fo cutting food, with pt simulating using knife and fork with max difficulty. Pt was shown rocker knife and she as able to use sucessfully following instruction. Pt was provided with information for purchase. Gentle composite finger passive ROM to bilateral hands.    4/15/25Paraffin to RUE x 10 mins, no adverse reactions, while therapist performed US  to LUE 3.3 mhz, 0.8 w/cm 2,  20%x 8 mins no adverse reactions for stiffness. Paraffin to LUE x 10 mins, no adverse reactions, while therapist performed US  to RUE 3.3 mhz, 0.8 w/cm 2,  20%x 8 mins no adverse reactions for stiffness.Gentle  PIP blocking and passive ROM individual and composite finger flexion followed by finger  thumb flexion bilateral UE's. Therapist checked progress towards goals and took updated measurements then discussed plans for renewal and updated goals. See goals   08/08/23-Paraffin to RUE x 10 mins, no adverse reactions, while therapist performed US  to LUE 3.3 mhz, 0.8 w/cm 2,  20%x 8 mins no adverse reactions for stiffness. Therapist discussed possible modifcations for eating with pt such as holding fork or spoon between digits, raising height of what she is eating by placing on a book, supporting right elbow with a pillow. Paraffin to LUE x 10 mins, no adverse reactions, while therapist performed US  to RUE 3.3 mhz, 0.8 w/cm 2,  20%x 8 mins no adverse reactions for stiffness.Gentle PIP blocking and passive ROM individual and composite finger flexion followed by finger  thumb flexion bilateral UE's. Pt reports hands are less painful at end of session.  08/03/23-Paraffin to RUE x 10 mins, no adverse reactions, while therapist performed US  to LUE 3.3 mhz, 0.8 w/cm 2,  20%x 8 mins no adverse reactions for stiffness. Paraffin to LUE x 10 mins, no adverse reactions, while therapist performed US  to RUE 3.3 mhz, 0.8 w/cm 2,  20%x 8 mins no adverse reactions for stiffness. Gentle PIP blocking and passive ROM composite finger flexion followed by finger thumb opposition and thumb flexion bilateral UE's. Pt with decreased pain and improved flexibility after US  today.   08/01/23 Paraffin to left hand x 10 mins while pt performed A/ROM  for RUE: blocking exercises followed by P/ROM composite flexion to digits individually and compositely, A/ROM thumb flexion/ extension and finger thumb opposition. Paraffin to RUE x 10 mins  while LUE A/ROM blocking exercises followed by P/ROM composite flexion to digits individually and compositely, finger thumb opposition and thumb flexion. Yelllow putty exercises for sustained grip and pinch for  bilateral UE's min v.c individual tip pinch for digits with exception of middle digit right hand. Pt rpeorts using her food processor to cut vegetables for soup.  Pt to bring in her pick next visit for adaptation.                                                                               PATIENT EDUCATION: Education details: see above Person educated: Patient Education method: Explanation, demonstration,  Education comprehension: verbalized understanding, returned demonstration  HOME EXERCISE PROGRAM: AROM red putty  GOALS: Goals reviewed with patient? Yes  SHORT TERM GOALS: Target date: 09/14/23  I with HEP  Goal status:  met 06/07/23  2.  I with positioning/ splinting prn to minimize pain and defomity   Goal status: met, 07/13/23  3.  Pt will increase bilateral grip strength by 5 lbs for increased UE functional use. Upgraded goal Pt will increase bilateral grip strength to at least 25 lbs (after stretching). Baseline: 05/30/23- RUE 8 lbs, LUE 4 lbs Goal status:met 08/01/23-R 20 lbs, L  19 lbs,  08/15/23- RUE 18 lbsLUE 14 lbs continue to address upgraded goal.  4. Pt will demonstrate improved RUE fine motor coordiantion as evidenced by perfroming 9 hole peg test in 23 secs or less.  Goal status: new 5. I with updates to HEP.  Goal status: new 6. Pt will demonstrate improved composite finger flexion for ADLs as evidenced by pt. bringing middle fingertip for RUE within 1/2 an inch of palm.  Baseline: 3/4 inch from palm to middle fingertip  Goal status : new 7.Pt will demonstrate improved composite finger flexion for ADLs as evidenced by pt. bringing middle fingertip for LUE within 3/4 an inch of palm.  Baseline: 1 inch from palm to middle finger tip  Goal status: new  LONG TERM GOALS: Target date:11/07/23  I with updated HEP  Goal status:met 08/15/22  2.  Pt will improve Quick Dash score to 70% disability Baseline: 75% (05/30/23) Goal status:   improved - however not  fully met due to complications from shoulder pain-70.5%08/15/23  3.  Pt will demonstrate at least 65% composte flexion for LUE for increased functional use.  Goal status: met 65-70% , 08/15/23  4.  I with adapted strategies/ adapted equipment to minimize pain and to increase pt I with ADLs/IADLs  Goal status:  met for inital strategies, however continue goal as pt may report additional tasks that can benefit from modification.08/15/23  5.  Pt will demonstrate at least 65% composite flexion for RUE for increased functional use.  Goal status: met 70% 08/15/23  6. Pt will report that she is able to cut her meat such as steak or chicken modified independently.  [place]: unable  Goal status: new    7. Pt will report increased ease with opening/ closing ziplock bags.    Goal status: new    8. Pt will improve bilateral tip pinch by 2 lbs for increased ease with daily activities.     Goal status: new    9. Pt will improve bilateral lateral pinch by 2 lbs for increased ease with ADLs.    Goal status: new    ASSESSMENT:  CLINICAL IMPRESSION: Pt is progressing towards goals with improving A/ROM, however she has increased pain in hands today due to cooking alot over weekend.PERFORMANCE DEFICITS: in functional skills including ADLs,  IADLs, coordination, sensation, edema, ROM, strength, pain, flexibility, Fine motor control, Gross motor control, endurance, decreased knowledge of precautions, decreased knowledge of use of DME, and UE functional use,, and psychosocial skills including coping strategies, environmental adaptation, habits, interpersonal interactions, and routines and behaviors.   IMPAIRMENTS: are limiting patient from ADLs, IADLs, rest and sleep, education, play, leisure, and social participation.   COMORBIDITIES: may have co-morbidities  that affects occupational performance. Patient will benefit from skilled OT to address above impairments and improve overall function.  MODIFICATION  OR ASSISTANCE TO COMPLETE EVALUATION: No modification of tasks or assist necessary to complete an evaluation.  OT OCCUPATIONAL PROFILE AND HISTORY: Detailed assessment: Review of records and additional review of physical, cognitive, psychosocial history related to current functional performance.  CLINICAL DECISION MAKING: LOW - limited treatment options, no task modification necessary  REHAB POTENTIAL: Good  EVALUATION COMPLEXITY: Low      PLAN:  OT FREQUENCY: 2x/week  OT DURATION: 12 weeks( anticipate d/c after 8 weeks dependent on progress)  PLANNED INTERVENTIONS: 40981 OT Re-evaluation, 97535 self care/ADL training, 19147 therapeutic exercise, 97530 therapeutic activity, 97112 neuromuscular re-education, 97140 manual therapy, 97113 aquatic therapy, 97035 ultrasound, 97018 paraffin, 82956 fluidotherapy, 97010 moist heat, 97010 cryotherapy, 97034 contrast bath, 97760 Orthotics management and training, 21308 Splinting (initial encounter), [id number] Subsequent splinting/medication, passive range of motion, energy conservation, coping strategies training, patient/family education, and DME and/or AE instructions  RECOMMENDED OTHER SERVICES: none  CONSULTED AND AGREED WITH PLAN OF CARE: Patient  PLAN FOR NEXT SESSION:  review theraputty HEP   Haylee Mcanany, OT 08/21/2023, 2:11 PM

## 2023-08-22 ENCOUNTER — Ambulatory Visit (INDEPENDENT_AMBULATORY_CARE_PROVIDER_SITE_OTHER): Payer: Medicare Other | Admitting: Family

## 2023-08-22 ENCOUNTER — Encounter: Payer: Self-pay | Admitting: Family

## 2023-08-22 VITALS — BP 132/80 | HR 89 | Ht 61.0 in | Wt 199.0 lb

## 2023-08-22 DIAGNOSIS — E119 Type 2 diabetes mellitus without complications: Secondary | ICD-10-CM | POA: Diagnosis not present

## 2023-08-22 LAB — COMPREHENSIVE METABOLIC PANEL WITH GFR
ALT: 33 U/L (ref 0–35)
AST: 19 U/L (ref 0–37)
Albumin: 4.6 g/dL (ref 3.5–5.2)
Alkaline Phosphatase: 78 U/L (ref 39–117)
BUN: 14 mg/dL (ref 6–23)
CO2: 26 meq/L (ref 19–32)
Calcium: 9.6 mg/dL (ref 8.4–10.5)
Chloride: 102 meq/L (ref 96–112)
Creatinine, Ser: 0.5 mg/dL (ref 0.40–1.20)
GFR: 96.18 mL/min (ref >60.00)
Glucose, Bld: 170 mg/dL — ABNORMAL HIGH (ref 70–99)
Potassium: 4 meq/L (ref 3.5–5.1)
Sodium: 138 meq/L (ref 135–145)
Total Bilirubin: 0.3 mg/dL (ref 0.2–1.2)
Total Protein: 7 g/dL (ref 6.0–8.3)

## 2023-08-22 LAB — HEMOGLOBIN A1C: Hgb A1c MFr Bld: 7 % — ABNORMAL HIGH (ref 4.6–6.5)

## 2023-08-22 NOTE — Progress Notes (Signed)
 Veronica Galvan is a 68 y.o. 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   Rosacea 08/18/2021   Actinic keratosis 08/18/2021   Sensorineural hearing loss (SNHL) of left ear with unrestricted hearing of right ear 07/26/2021   Tinnitus of left ear 07/26/2021   Acute recurrent maxillary sinusitis 06/22/2021   Primary hypertension 01/12/2021   Hyperlipidemia associated with type 2 diabetes mellitus (HCC) 01/12/2021   Arthritis 01/12/2021   Type II diabetes mellitus (HCC) 11/25/2019   Ingrown toenail 09/05/2018   Neck pain 01/29/2018   Tick bite 10/24/2017    Current Outpatient Medications  Medication Sig Dispense Refill   acetaminophen  (TYLENOL ) 650 MG CR tablet Take 1,300 mg by mouth every 8 (eight) hours as needed for pain.     ALPRAZolam  (XANAX ) 0.5 MG tablet Take 1 tablet (0.5 mg total) by mouth at bedtime as needed for anxiety. 30 tablet 0   Ascorbic Acid (VITAMIN C) 1000 MG tablet Take 1,000 mg by mouth every morning.     aspirin EC 81 MG tablet Take 81 mg by mouth every morning.     atorvastatin  (LIPITOR) 40 MG tablet TAKE ONE TABLET BY MOUTH EVERY EVENING 90 tablet 1   Blood Glucose Monitoring Suppl DEVI 1 each by Does not apply route in the morning, at noon, and at bedtime. May substitute to any manufacturer covered by patient's insurance. 1 each 0   Calcium  Carb-Cholecalciferol (CALCIUM  600+D3 PO) Take 1 tablet by mouth every morning.     calcium  carbonate (TUMS - DOSED IN MG ELEMENTAL CALCIUM ) 500 MG chewable tablet Chew 2 tablets by mouth daily as needed for indigestion or heartburn.      carboxymethylcellulose (REFRESH TEARS) 0.5 % SOLN Place 1 drop into both eyes 2 (two) times daily.     celecoxib  (CELEBREX ) 200 MG capsule TAKE 1 CAPSULE BY MOUTH DAILY 90 capsule 3   Coenzyme Q10 300 MG CAPS Take 1 capsule by mouth every morning.     diclofenac Sodium (VOLTAREN) 1 % GEL Apply 1 Application topically 2 (two) times daily.     fluticasone  (FLONASE ) 50 MCG/ACT nasal spray Place 2 sprays into both nostrils daily. 16 g 6   Glucose Blood (BLOOD GLUCOSE TEST STRIPS) STRP 1 each by In Vitro route in the morning, at noon, and at bedtime. May substitute to any manufacturer covered by patient's insurance. 100 strip 0   glucose blood test strip Use as instructed 100 each 12   Lancet Device MISC 1 each by Does not apply route in the morning, at noon, and at bedtime. May substitute to any manufacturer covered by patient's insurance. 1 each 0   Lancets Misc. MISC 1 each by Does not apply route in the morning, at noon, and at bedtime. May substitute to any manufacturer covered by patient's insurance. 100 each 0   levocetirizine (XYZAL ) 5 MG tablet Take 1 tablet (5 mg total) by mouth every evening. 90 tablet 1   lisinopril  (ZESTRIL ) 5 MG tablet TAKE 1 TABLET BY MOUTH 2 TIMES A DAY 180 tablet 1   LUTEIN-ZEAXANTHIN PO Take 1 tablet by mouth every morning.     MAGNESIUM CITRATE PO  Take 500 mg by mouth daily.     Multiple Vitamins-Minerals (MULTIVITAMIN WITH MINERALS) tablet Take 1 tablet by mouth every morning.     Omega-3 Fatty Acids (FISH OIL) 1000 MG CAPS Take 2,000 mg by mouth daily.     omeprazole  (PRILOSEC) 20 MG capsule TAKE 1 CAPSULE BY MOUTH EVERY MORNING 90 capsule 1   polyethylene glycol powder (GLYCOLAX /MIRALAX ) 17 GM/SCOOP powder Take 238 g by mouth daily. 255 g 3   Semaglutide ,0.25 or 0.5MG /DOS, (OZEMPIC , 0.25 OR 0.5 MG/DOSE,) 2 MG/3ML SOPN Inject 2 mg into the skin once a week.     sodium chloride  (MURO 128) 2 % ophthalmic solution Place 1 drop into both eyes 2 (two) times daily.      SYNJARDY  XR 12.08-998 MG TB24 Take 1 tablet by mouth 2 (two) times daily. 180 tablet 3   TURMERIC PO Take 2,000 mg by mouth daily.     UNABLE TO FIND Take 1 tablet by mouth daily. Cinsulin supplement     vitamin E 180 MG (400 UNITS) capsule Take 400 Units by mouth daily.     No current facility-administered medications for this visit.    Allergies: Codeine, Benzonatate, and Epinephrine  Past Medical History:  Diagnosis Date   Allergy     Anxiety    Arthritis    hands   Cataract    Diabetes mellitus without complication (HCC)    type 2   Family history of adverse reaction to anesthesia    son has malignant hyperthermia, daughter does not daughter recently had c section without problems   GERD (gastroesophageal reflux disease)    Headache    sinus   Hyperlipidemia    Hypertension    PONV (postoperative nausea and vomiting)    nausea only   Ulcer, stomach peptic yrs ago    Past Surgical History:  Procedure Laterality Date   APPENDECTOMY     both hells bone spur repair     both heels with metal clips   both shoulder rotator cuff repair     CESAREAN SECTION     x 1   COLONOSCOPY WITH PROPOFOL  N/A 09/07/2016   Procedure: COLONOSCOPY WITH PROPOFOL ;  Surgeon: Garrett Kallman, MD;  Location: [facility];  Service: Endoscopy;  Laterality: N/A;   colonscopy  06/2011   polyps   ELBOW FRACTURE SURGERY Left    EYE SURGERY     FRACTURE SURGERY     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/10/2022   Procedure: REVERSE SHOULDER ARTHROPLASTY;  Surgeon: Ellard Gunning, MD;  Location: [facility];  Service: Orthopedics;  Laterality: Right;  Please follow in room 6 if able   TUBAL LIGATION     VESICO-VAGINAL FISTULA REPAIR      Family History  Problem Relation Age of Onset   Hypertension Mother    COPD Mother    Cancer Mother    Arthritis Mother    Hypertension Father    Hyperlipidemia Father    Diabetes Father    Cancer Father    Alcohol abuse Father    Diabetes Brother     Alcohol abuse Brother    Hypertension Brother    Arthritis Maternal Grandmother    Birth defects Maternal Grandmother    Diabetes Paternal Grandmother    Heart disease Paternal Grandfather    Diabetes Paternal Aunt    Diabetes Paternal Aunt    Obesity Son     Social History   Tobacco Use   Smoking status: Former  Current packs/day: 1.00    Average packs/day: 1 pack/day for 14.0 years (14.0 ttl pk-yrs)    Types: Cigarettes   Smokeless tobacco: Never   Tobacco comments:    quit 31 yrs ago  Substance Use Topics   Alcohol use: Yes    Comment: rare    Subjective:   3 month follow up on Type 2 Diabetes; no acute concerns regarding her blood sugars; Is continuing to struggle with worsening right shoulder pain- is 9 months out from surgery and feels that pain is actually worse/ regressing; had bone scan last Friday and will be meeting with orthopedist next week to discuss next steps;   Denies any chest pain, shortness of breath, blurred vision or headache   Objective:  Vitals:   08/22/23 1302  BP: 132/80  Pulse: 89  SpO2: 95%  Weight: 199 lb (90.3 kg)  Height: 5\' 1"  (1.549 m)    General: Well developed, well nourished, in no acute distress  Skin : Warm and dry.  Head: Normocephalic and atraumatic  Lungs: Respirations unlabored; clear to auscultation bilaterally without wheeze, rales, rhonchi  CVS exam: normal rate and regular rhythm.  Neurologic: Alert and oriented; speech intact; face symmetrical; moves all extremities well; CNII-XII intact without focal deficit   Assessment:  1. Type 2 diabetes mellitus without complication, without long-term current use of insulin  (HCC)     Plan:  Update labs today; most recent Hgba1c shows very good control; plan for follow up in 4 months;  She will also reach out after seeing ortho next week to let me know what next steps of treatment will be;   No follow-ups on file.  Orders Placed This Encounter  Procedures   Comp Met (CMET)    Hemoglobin A1c    Requested Prescriptions    No prescriptions requested or ordered in this encounter

## 2023-08-23 ENCOUNTER — Ambulatory Visit: Admitting: Physical Therapy

## 2023-08-23 ENCOUNTER — Encounter: Payer: Self-pay | Admitting: Family

## 2023-08-24 ENCOUNTER — Ambulatory Visit: Admitting: Physical Therapy

## 2023-08-24 ENCOUNTER — Encounter: Payer: Self-pay | Admitting: Occupational Therapy

## 2023-08-24 ENCOUNTER — Ambulatory Visit: Admitting: Occupational Therapy

## 2023-08-24 DIAGNOSIS — M79642 Pain in left hand: Secondary | ICD-10-CM | POA: Diagnosis not present

## 2023-08-24 DIAGNOSIS — M25611 Stiffness of right shoulder, not elsewhere classified: Secondary | ICD-10-CM | POA: Diagnosis not present

## 2023-08-24 DIAGNOSIS — M25641 Stiffness of right hand, not elsewhere classified: Secondary | ICD-10-CM

## 2023-08-24 DIAGNOSIS — M542 Cervicalgia: Secondary | ICD-10-CM | POA: Diagnosis not present

## 2023-08-24 DIAGNOSIS — M25511 Pain in right shoulder: Secondary | ICD-10-CM | POA: Diagnosis not present

## 2023-08-24 DIAGNOSIS — R278 Other lack of coordination: Secondary | ICD-10-CM

## 2023-08-24 DIAGNOSIS — R600 Localized edema: Secondary | ICD-10-CM | POA: Diagnosis not present

## 2023-08-24 DIAGNOSIS — M25642 Stiffness of left hand, not elsewhere classified: Secondary | ICD-10-CM | POA: Diagnosis not present

## 2023-08-24 DIAGNOSIS — M79641 Pain in right hand: Secondary | ICD-10-CM

## 2023-08-24 DIAGNOSIS — M6281 Muscle weakness (generalized): Secondary | ICD-10-CM | POA: Diagnosis not present

## 2023-08-24 NOTE — Patient Instructions (Signed)
 hello

## 2023-08-24 NOTE — Therapy (Addendum)
 OUTPATIENT OCCUPATIONAL THERAPY ORTHO Treatment  Patient Name: Veronica Galvan MRN: [id number] DOB:[birth year], 68 y.o., female Today's Date: 08/24/2023  PCP: Adra Alanis [name] REFERRING PROVIDER: Adra Alanis [name]  END OF SESSION:  OT End of Session - 08/24/23 1042     Visit Number 19    Number of Visits 41    Date for OT Re-Evaluation 11/07/23    Authorization Type [employer] MCR    Authorization Time Period 12 weeks    Progress Note Due on Visit 26    OT Start Time 0847    OT Stop Time 0930    OT Time Calculation (min) 43 min    Activity Tolerance Patient tolerated treatment well    Behavior During Therapy WFL for tasks assessed/performed                      Past Medical History:  Diagnosis Date   Allergy     Anxiety    Arthritis    hands   Cataract    Diabetes mellitus without complication (HCC)    type 2   Family history of adverse reaction to anesthesia    son has malignant hyperthermia, daughter does not daughter recently had c section without problems   GERD (gastroesophageal reflux disease)    Headache    sinus   Hyperlipidemia    Hypertension    PONV (postoperative nausea and vomiting)    nausea only   Ulcer, stomach peptic yrs ago   Past Surgical History:  Procedure Laterality Date   APPENDECTOMY     both hells bone spur repair     both heels with metal clips   both shoulder rotator cuff repair     CESAREAN SECTION     x 1   COLONOSCOPY WITH PROPOFOL  N/A 09/07/2016   Procedure: COLONOSCOPY WITH PROPOFOL ;  Surgeon: Garrett Kallman, MD;  Location: [facility];  Service: Endoscopy;  Laterality: N/A;   colonscopy  06/2011   polyps   ELBOW FRACTURE SURGERY Left    EYE SURGERY     FRACTURE SURGERY     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/10/2022   Procedure: REVERSE SHOULDER ARTHROPLASTY;  Surgeon: Ellard Gunning, MD;  Location: [facility];  Service: Orthopedics;  Laterality: Right;  Please follow in room 6 if able    TUBAL LIGATION     VESICO-VAGINAL FISTULA REPAIR     Patient Active Problem List   Diagnosis Date Noted   Gastroesophageal reflux disease 11/16/2021   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 08/18/2021   Dermatofibroma 08/18/2021   History of malignant neoplasm of skin 08/18/2021   Lentigo 08/18/2021   Melanocytic nevi of trunk 08/18/2021   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   Rosacea 08/18/2021   Actinic keratosis 08/18/2021   Sensorineural hearing loss (SNHL) of left ear with unrestricted hearing of right ear 07/26/2021   Tinnitus of left ear 07/26/2021   Acute recurrent maxillary sinusitis 06/22/2021   Primary hypertension 01/12/2021   Hyperlipidemia associated with type 2 diabetes mellitus (HCC) 01/12/2021   Arthritis 01/12/2021   Type II diabetes mellitus (HCC) 11/25/2019   Ingrown toenail 09/05/2018   Neck pain 01/29/2018   Tick bite 10/24/2017    ONSET DATE: 05/19/23  REFERRING DIAG:  Diagnosis  M79.641,M79.642 (ICD-10-CM) - Bilateral hand pain    THERAPY DIAG:  Muscle weakness (generalized)  Stiffness of right hand, not elsewhere classified  Pain in right hand  Pain in left hand  Other  lack of coordination  Stiffness of left hand, not elsewhere classified  Rationale for Evaluation and Treatment: Rehabilitation  SUBJECTIVE:   SUBJECTIVE STATEMENT: Pt reports frustration regarding her shoulders  Pt accompanied by: self  PERTINENT HISTORY: 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 Yes: NPRS scale: 5/10- right hand, 3-4/10 left hand Pain location: right hand, left hand Pain description: aching, stiff Aggravating factors: gripping  Relieving factors: heat, meds Pt also has shoulder  pain which is more significant grossly 8/10 10 which OT will not address. Pt is on hold from PT pending further testing  FALLS: Has patient fallen in last 6 months? No  LIVING ENVIRONMENT: Lives with: lives alone Lives in: House/apartment Stairs: yes   PLOF: Independent  PATIENT GOALS: improve functional use of hands and learn adapted strategies for ADLs/IADLs.  NEXT MD VISIT: unknown  OBJECTIVE:  Note: Objective measures were completed at Evaluation unless otherwise noted.  HAND DOMINANCE: Right  ADLs: Overall ADLs: unable to grip credit card to remove from ATM Transfers/ambulation related to ADLs: mod I Eating: drops silverware Grooming: drops toothbrush, uses electric toothbrush, holding comb Upper body dressing: adjusting bra Lower body dressing: difficulty pulling up pants  Toileting: hygeine was difficult initally Bathing: mod I, difficult with shaving Tub shower transfers: walk in shower    FUNCTIONAL OUTCOME MEASURES: Quick Dash: 05/30/23- 75%% disability Quick Dash 08/15/23- 70.5% disability   UPPER EXTREMITY ROM:   RUE wrist flexion/ extension: 70/ 50, LUE wrist flexion/ extension: 70/ 45 Pt demonstrates grossly 50% composite flexion for right and 555 with left.   Active ROM Right eval Left eval  Thumb MCP (0-60) 45 55  Thumb IP (0-80) 25 10  Thumb Radial abd/add (0-55)     Thumb Palmar abd/add (0-45)     Thumb Opposition to Small Finger yes yes   Index MCP (0-90)     Index PIP (0-100)     Index DIP (0-70)      Long MCP (0-90)      Long PIP (0-100)      Long DIP (0-70)      Ring MCP (0-90)      Ring PIP (0-100)      Ring DIP (0-70)      Little MCP (0-90)      Little PIP (0-100)      Little DIP (0-70)      (Blank rows = not tested) 9 hole peg test (08/15/23) RUE 26.70 secs, LUE 23 secs  HAND FUNCTION: Grip strength: Right: 8 lbs; Left: 4 lbs,          08/15/23-  RUE 18, LUE 14 lbs,           08/15/23   Pinch: RUE tip 4lbs, lateral 8 lbs, LUE tip  3 lbs, lateral 8 lbs   SENSATION: Light touch: Impaired index finger LUE  EDEMA: Pt with bony defomities at PIP joints of all digits which pt reports are bone spurs, Pt also has bony deformity at DIP for right thumb, index and 5th digit and LUE small and index fingers  COGNITION: Overall cognitive status: Within functional limits for tasks assessed   OBSERVATIONS: Pleasant female desires to increase functional use of bilateral UE's   TREATMENT DATE: 08/24/23--Paraffin to RUE x 10 mins, no  adverse reactions, while therapist performed US  to LUE 3.3 mhz, 0.8 w/cm 2,  20%x 8 mins no adverse reactions for stiffness. Paraffin to LUE x 10 mins, no adverse reactions, while therapist performed US  to RUE 3.3 mhz, 0.8 w/cm 2,  20%x 8 mins no adverse reactions for stiffness.  Pt reports she ordered and received a rocker knife. she reports she was able to use the rocker knife to cut corned beef last night with sucess. Composite finger P/ROM to bilateral hands, followed by review of red putty exercises for composite flexion and tip pinch for bilateral UE's, min v.c. Pt was encouraged to perfrom slowly and to stop if she has significnatly increased pain.  08/21/23-Paraffin to RUE x 10 mins, no adverse reactions, while therapist performed US  to LUE 3.3 mhz, 0.8 w/cm 2,  20%x 8 mins no adverse reactions for stiffness. Paraffin to LUE x 10 mins, no adverse reactions, while therapist performed US  to RUE 3.3 mhz, 0.8 w/cm 2,  20%x 8 mins no adverse reactions for stiffness.  Composite finger passive P/ROM to bilateral hands, as well as PIP blocking for index and ring fingers, a/RPM individual finger extension with right and left UE's Digi flex light resistance for individual resisted finger flexion, then composite finger flexion for bilateral UE's  08/17/23 Paraffin to RUE x 10 mins, no adverse reactions, while therapist performed US  to LUE 3.3 mhz, 0.8 w/cm 2,  20%x 8 mins no adverse reactions for  stiffness. Paraffin to LUE x 10 mins, no adverse reactions, while therapist performed US  to RUE 3.3 mhz, 0.8 w/cm 2,  20%x 8 mins no adverse reactions for stiffness.  Education regarding strategies fo cutting food, with pt simulating using knife and fork with max difficulty. Pt was shown rocker knife and she as able to use sucessfully following instruction. Pt was provided with information for purchase. Gentle composite finger passive ROM to bilateral hands.    4/15/25Paraffin to RUE x 10 mins, no adverse reactions, while therapist performed US  to LUE 3.3 mhz, 0.8 w/cm 2,  20%x 8 mins no adverse reactions for stiffness. Paraffin to LUE x 10 mins, no adverse reactions, while therapist performed US  to RUE 3.3 mhz, 0.8 w/cm 2,  20%x 8 mins no adverse reactions for stiffness.Gentle PIP blocking and passive ROM individual and composite finger flexion followed by finger  thumb flexion bilateral UE's. Therapist checked progress towards goals and took updated measurements then discussed plans for renewal and updated goals. See goals   08/08/23-Paraffin to RUE x 10 mins, no adverse reactions, while therapist performed US  to LUE 3.3 mhz, 0.8 w/cm 2,  20%x 8 mins no adverse reactions for stiffness. Therapist discussed possible modifcations for eating with pt such as holding fork or spoon between digits, raising height of what she is eating by placing on a book, supporting right elbow with a pillow. Paraffin to LUE x 10 mins, no adverse reactions, while therapist performed US  to RUE 3.3 mhz, 0.8 w/cm 2,  20%x 8 mins no adverse reactions for stiffness.Gentle PIP blocking and passive ROM individual and composite finger flexion followed by finger  thumb flexion bilateral UE's. Pt reports hands are less painful at end of session.  08/03/23-Paraffin to RUE x 10 mins, no adverse reactions, while therapist performed US  to LUE 3.3 mhz, 0.8 w/cm 2,  20%x 8 mins no adverse reactions for stiffness. Paraffin to LUE x 10 mins,  no adverse reactions, while therapist performed US  to RUE 3.3 mhz, 0.8 w/cm 2,  20%x  8 mins no adverse reactions for stiffness. Gentle PIP blocking and passive ROM composite finger flexion followed by finger thumb opposition and thumb flexion bilateral UE's. Pt with decreased pain and improved flexibility after US  today.   08/01/23 Paraffin to left hand x 10 mins while pt performed A/ROM  for RUE: blocking exercises followed by P/ROM composite flexion to digits individually and compositely, A/ROM thumb flexion/ extension and finger thumb opposition. Paraffin to RUE x 10 mins  while LUE A/ROM blocking exercises followed by P/ROM composite flexion to digits individually and compositely, finger thumb opposition and thumb flexion. Yelllow putty exercises for sustained grip and pinch for bilateral UE's min v.c individual tip pinch for digits with exception of middle digit right hand. Pt rpeorts using her food processor to cut vegetables for soup. Pt to bring in her pick next visit for adaptation.                                                                               PATIENT EDUCATION: Education details:  yellow putty HEP review Person educated: Patient Education method: Explanation, demonstration, v.c Education comprehension: verbalized understanding, returned demonstration  HOME EXERCISE PROGRAM: AROM yellow putty  GOALS: Goals reviewed with patient? Yes  SHORT TERM GOALS: Target date: 09/14/23  I with HEP  Goal status:  met 06/07/23  2.  I with positioning/ splinting prn to minimize pain and defomity   Goal status: met, 07/13/23  3.  Pt will increase bilateral grip strength by 5 lbs for increased UE functional use. Upgraded goal Pt will increase bilateral grip strength to at least 25 lbs (after stretching). Baseline: 05/30/23- RUE 8 lbs, LUE 4 lbs Goal status:met 08/01/23-R 20 lbs, L  19 lbs,  08/15/23- RUE 18 lbsLUE 14 lbs continue to address upgraded goal.  4. Pt will demonstrate  improved RUE fine motor coordiantion as evidenced by perfroming 9 hole peg test in 23 secs or less.  Goal status: new 5. I with updates to HEP.  Goal status: new 6. Pt will demonstrate improved composite finger flexion for ADLs as evidenced by pt. bringing middle fingertip for RUE within 1/2 an inch of palm.  Baseline: 3/4 inch from palm to middle fingertip  Goal status : new 7.Pt will demonstrate improved composite finger flexion for ADLs as evidenced by pt. bringing middle fingertip for LUE within 3/4 an inch of palm.  Baseline: 1 inch from palm to middle finger tip  Goal status: new  LONG TERM GOALS: Target date:11/07/23  I with updated HEP  Goal status:met 08/15/22  2.  Pt will improve Quick Dash score to 70% disability Baseline: 75% (05/30/23) Goal status:   improved - however not fully met due to complications from shoulder pain-70.5%08/15/23  3.  Pt will demonstrate at least 65% composte flexion for LUE for increased functional use.  Goal status: met 65-70% , 08/15/23  4.  I with adapted strategies/ adapted equipment to minimize pain and to increase pt I with ADLs/IADLs  Goal status:  met for inital strategies, however continue goal as pt may report additional tasks that can benefit from modification.08/15/23  5.  Pt will demonstrate at least 65% composite flexion for RUE for increased  functional use.  Goal status: met 70% 08/15/23  6. Pt will report that she is able to cut her meat such as steak or chicken modified independently.  [place]: unable  Goal status: new    7. Pt will report increased ease with opening/ closing ziplock bags.    Goal status: new    8. Pt will improve bilateral tip pinch by 2 lbs for increased ease with daily activities.     Goal status: new    9. Pt will improve bilateral lateral pinch by 2 lbs for increased ease with ADLs.    Goal status: new    ASSESSMENT: CLINICAL IMPRESSION: Pt is progressing towards goals. She arrived today demonstrating  improved A/ROM and she has mildly decreased pain. Pt reports using a rocker knife to cut corned beef last night.  Aaron AasPERFORMANCE DEFICITS: in functional skills including ADLs, IADLs, coordination, sensation, edema, ROM, strength, pain, flexibility, Fine motor control, Gross motor control, endurance, decreased knowledge of precautions, decreased knowledge of use of DME, and UE functional use,, and psychosocial skills including coping strategies, environmental adaptation, habits, interpersonal interactions, and routines and behaviors.   IMPAIRMENTS: are limiting patient from ADLs, IADLs, rest and sleep, education, play, leisure, and social participation.   COMORBIDITIES: may have co-morbidities  that affects occupational performance. Patient will benefit from skilled OT to address above impairments and improve overall function.  MODIFICATION OR ASSISTANCE TO COMPLETE EVALUATION: No modification of tasks or assist necessary to complete an evaluation.  OT OCCUPATIONAL PROFILE AND HISTORY: Detailed assessment: Review of records and additional review of physical, cognitive, psychosocial history related to current functional performance.  CLINICAL DECISION MAKING: LOW - limited treatment options, no task modification necessary  REHAB POTENTIAL: Good  EVALUATION COMPLEXITY: Low      PLAN:  OT FREQUENCY: 2x/week  OT DURATION: 12 weeks( anticipate d/c after 8 weeks dependent on progress)  PLANNED INTERVENTIONS: 16109 OT Re-evaluation, 97535 self care/ADL training, 60454 therapeutic exercise, 97530 therapeutic activity, 97112 neuromuscular re-education, 97140 manual therapy, 97113 aquatic therapy, 97035 ultrasound, 97018 paraffin, 09811 fluidotherapy, 97010 moist heat, 97010 cryotherapy, 97034 contrast bath, 97760 Orthotics management and training, 91478 Splinting (initial encounter), [id number] Subsequent splinting/medication, passive range of motion, energy conservation, coping strategies training,  patient/family education, and DME and/or AE instructions  RECOMMENDED OTHER SERVICES: none  CONSULTED AND AGREED WITH PLAN OF CARE: Patient  PLAN FOR NEXT SESSION:  continue modalities, ROM and AE   Yuvaan Olander, OT 08/24/2023, 10:43 AM

## 2023-08-25 ENCOUNTER — Encounter: Payer: Self-pay | Admitting: Pediatrics

## 2023-08-29 ENCOUNTER — Ambulatory Visit: Admitting: Physical Therapy

## 2023-08-29 ENCOUNTER — Encounter: Payer: Self-pay | Admitting: Occupational Therapy

## 2023-08-29 ENCOUNTER — Ambulatory Visit: Admitting: Occupational Therapy

## 2023-08-29 DIAGNOSIS — M79642 Pain in left hand: Secondary | ICD-10-CM | POA: Diagnosis not present

## 2023-08-29 DIAGNOSIS — M6281 Muscle weakness (generalized): Secondary | ICD-10-CM | POA: Diagnosis not present

## 2023-08-29 DIAGNOSIS — M25642 Stiffness of left hand, not elsewhere classified: Secondary | ICD-10-CM | POA: Diagnosis not present

## 2023-08-29 DIAGNOSIS — M542 Cervicalgia: Secondary | ICD-10-CM | POA: Diagnosis not present

## 2023-08-29 DIAGNOSIS — M25641 Stiffness of right hand, not elsewhere classified: Secondary | ICD-10-CM

## 2023-08-29 DIAGNOSIS — R278 Other lack of coordination: Secondary | ICD-10-CM

## 2023-08-29 DIAGNOSIS — R600 Localized edema: Secondary | ICD-10-CM | POA: Diagnosis not present

## 2023-08-29 DIAGNOSIS — Z471 Aftercare following joint replacement surgery: Secondary | ICD-10-CM | POA: Diagnosis not present

## 2023-08-29 DIAGNOSIS — M25511 Pain in right shoulder: Secondary | ICD-10-CM | POA: Diagnosis not present

## 2023-08-29 DIAGNOSIS — M25611 Stiffness of right shoulder, not elsewhere classified: Secondary | ICD-10-CM | POA: Diagnosis not present

## 2023-08-29 DIAGNOSIS — M79641 Pain in right hand: Secondary | ICD-10-CM | POA: Diagnosis not present

## 2023-08-29 DIAGNOSIS — Z96611 Presence of right artificial shoulder joint: Secondary | ICD-10-CM | POA: Diagnosis not present

## 2023-08-29 NOTE — Therapy (Addendum)
 OUTPATIENT OCCUPATIONAL THERAPY ORTHO Treatment  Patient Name: Veronica Galvan MRN: [id number] DOB:April 21, 1956, 68 y.o., female Today's Date: 08/29/2023  PCP: Adra Alanis [name] REFERRING PROVIDER: Adra Alanis [name]  END OF SESSION:  OT End of Session - 08/29/23 0905     Visit Number 20    Number of Visits 41    Date for OT Re-Evaluation 11/07/23    Authorization Type [employer] M[employer]    Authorization Time Period 12 weeks    Authorization - Visit Number 18    Progress Note Due on Visit 26    OT Start Time 0803    OT Stop Time 0845    OT Time Calculation (min) 42 min    Activity Tolerance Patient tolerated treatment well    Behavior During Therapy WFL for tasks assessed/performed                      Past Medical History:  Diagnosis Date   Allergy     Anxiety    Arthritis    hands   Cataract    Diabetes mellitus without complication (HCC)    type 2   Family history of adverse reaction to anesthesia    son has malignant hyperthermia, daughter does not daughter recently had c section without problems   GERD (gastroesophageal reflux disease)    Headache    sinus   Hyperlipidemia    Hypertension    PONV (postoperative nausea and vomiting)    nausea only   Ulcer, stomach peptic yrs ago   Past Surgical History:  Procedure Laterality Date   APPENDECTOMY     both hells bone spur repair     both heels with metal clips   both shoulder rotator cuff repair     CESAREAN SECTION     x 1   COLONOSCOPY WITH PROPOFOL  N/A 09/07/2016   Procedure: COLONOSCOPY WITH PROPOFOL ;  Surgeon: Garrett Kallman, MD;  Location: [facility];  Service: Endoscopy;  Laterality: N/A;   colonscopy  06/2011   polyps   ELBOW FRACTURE SURGERY Left    EYE SURGERY     FRACTURE SURGERY     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/10/2022   Procedure: REVERSE SHOULDER ARTHROPLASTY;  Surgeon: Ellard Gunning, MD;  Location: [facility];  Service: Orthopedics;  Laterality: Right;   Please follow in room 6 if able   TUBAL LIGATION     VESICO-VAGINAL FISTULA REPAIR     Patient Active Problem List   Diagnosis Date Noted   Gastroesophageal reflux disease 11/16/2021   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 08/18/2021   Dermatofibroma 08/18/2021   History of malignant neoplasm of skin 08/18/2021   Lentigo 08/18/2021   Melanocytic nevi of trunk 08/18/2021   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   Rosacea 08/18/2021   Actinic keratosis 08/18/2021   Sensorineural hearing loss (SNHL) of left ear with unrestricted hearing of right ear 07/26/2021   Tinnitus of left ear 07/26/2021   Acute recurrent maxillary sinusitis 06/22/2021   Primary hypertension 01/12/2021   Hyperlipidemia associated with type 2 diabetes mellitus (HCC) 01/12/2021   Arthritis 01/12/2021   Type II diabetes mellitus (HCC) 11/25/2019   Ingrown toenail 09/05/2018   Neck pain 01/29/2018   Tick bite 10/24/2017    ONSET DATE: 05/19/23  REFERRING DIAG:  Diagnosis  M79.641,M79.642 (ICD-10-CM) - Bilateral hand pain    THERAPY DIAG:  Muscle weakness (generalized)  Stiffness of right hand, not elsewhere classified  Pain in right  hand  Pain in left hand  Other lack of coordination  Stiffness of left hand, not elsewhere classified  Rationale for Evaluation and Treatment: Rehabilitation  SUBJECTIVE:   SUBJECTIVE STATEMENT: Pt reports frustration regarding her shoulders  Pt accompanied by: self  PERTINENT HISTORY: 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 Yes: NPRS scale: 5/10- right hand, 3-4/10 left hand Pain location: right hand, left hand Pain description: aching, stiff Aggravating factors: gripping  Relieving  factors: heat, meds Pt also has shoulder pain which is more significant grossly 8/10 10 which OT will not address. Pt is on hold from PT pending further testing  FALLS: Has patient fallen in last 6 months? No  LIVING ENVIRONMENT: Lives with: lives alone Lives in: House/apartment Stairs: yes   PLOF: Independent  PATIENT GOALS: improve functional use of hands and learn adapted strategies for ADLs/IADLs.  NEXT MD VISIT: unknown  OBJECTIVE:  Note: Objective measures were completed at Evaluation unless otherwise noted.  HAND DOMINANCE: Right  ADLs: Overall ADLs: unable to grip credit card to remove from ATM Transfers/ambulation related to ADLs: mod I Eating: drops silverware Grooming: drops toothbrush, uses electric toothbrush, holding comb Upper body dressing: adjusting bra Lower body dressing: difficulty pulling up pants  Toileting: hygeine was difficult initally Bathing: mod I, difficult with shaving Tub shower transfers: walk in shower    FUNCTIONAL OUTCOME MEASURES: Quick Dash: 05/30/23- 75%% disability Quick Dash 08/15/23- 70.5% disability   UPPER EXTREMITY ROM:   RUE wrist flexion/ extension: 70/ 50, LUE wrist flexion/ extension: 70/ 45 Pt demonstrates grossly 50% composite flexion for right and 555 with left.   Active ROM Right eval Left eval  Thumb MCP (0-60) 45 55  Thumb IP (0-80) 25 10  Thumb Radial abd/add (0-55)     Thumb Palmar abd/add (0-45)     Thumb Opposition to Small Finger yes yes   Index MCP (0-90)     Index PIP (0-100)     Index DIP (0-70)      Long MCP (0-90)      Long PIP (0-100)      Long DIP (0-70)      Ring MCP (0-90)      Ring PIP (0-100)      Ring DIP (0-70)      Little MCP (0-90)      Little PIP (0-100)      Little DIP (0-70)      (Blank rows = not tested) 9 hole peg test (08/15/23) RUE 26.70 secs, LUE 23 secs  HAND FUNCTION: Grip strength: Right: 8 lbs; Left: 4 lbs,          08/15/23-  RUE 18, LUE 14 lbs,           08/15/23    Pinch: RUE tip 4lbs, lateral 8 lbs, LUE tip 3 lbs, lateral 8 lbs   SENSATION: Light touch: Impaired index finger LUE  EDEMA: Pt with bony defomities at PIP joints of all digits which pt reports are bone spurs, Pt also has bony deformity at DIP for right thumb, index and 5th digit and LUE small and index fingers  COGNITION: Overall cognitive status: Within functional limits for tasks assessed   OBSERVATIONS: Pleasant female desires to increase functional use of bilateral UE's   TREATMENT  DATE: 08/29/23-Paraffin to RUE x 10 mins, no adverse reactions, while therapist performed US  to LUE 3.3 mhz, 0.8 w/cm 2,  20%x 8 mins no adverse reactions for stiffness. Paraffin to LUE x 10 mins, no adverse reactions, while therapist performed US  to RUE 3.3 mhz, 0.8 w/cm 2,  20%x 8 mins no adverse reactions for stiffness.  P/ROM to RUE PIP joints individually then compositely in flexion followed by passive stretch to digits of LUE individually in composite flexion then all digits together for passive composite finger flexion. Pt with impoved bilateral flexibility end of session and decreased pain. Pt sees orthopedicst today regarding her shoulders.  08/24/23--Paraffin to RUE x 10 mins, no adverse reactions, while therapist performed US  to LUE 3.3 mhz, 0.8 w/cm 2,  20%x 8 mins no adverse reactions for stiffness. Paraffin to LUE x 10 mins, no adverse reactions, while therapist performed US  to RUE 3.3 mhz, 0.8 w/cm 2,  20%x 8 mins no adverse reactions for stiffness.  Pt reports she ordered and received a rocker knife. she reports she was able to use the rocker knife to cut corned beef last night with sucess. Composite finger P/ROM to bilateral hands, followed by review of red putty exercises for composite flexion and tip pinch for bilateral UE's, min v.c. Pt was encouraged to perfrom slowly and to stop if she has significnatly increased pain.  08/21/23-Paraffin to RUE x 10 mins, no adverse reactions, while  therapist performed US  to LUE 3.3 mhz, 0.8 w/cm 2,  20%x 8 mins no adverse reactions for stiffness. Paraffin to LUE x 10 mins, no adverse reactions, while therapist performed US  to RUE 3.3 mhz, 0.8 w/cm 2,  20%x 8 mins no adverse reactions for stiffness.  Composite finger passive P/ROM to bilateral hands, as well as PIP blocking for index and ring fingers, a/RPM individual finger extension with right and left UE's Digi flex light resistance for individual resisted finger flexion, then composite finger flexion for bilateral UE's  08/17/23 Paraffin to RUE x 10 mins, no adverse reactions, while therapist performed US  to LUE 3.3 mhz, 0.8 w/cm 2,  20%x 8 mins no adverse reactions for stiffness. Paraffin to LUE x 10 mins, no adverse reactions, while therapist performed US  to RUE 3.3 mhz, 0.8 w/cm 2,  20%x 8 mins no adverse reactions for stiffness.  Education regarding strategies fo cutting food, with pt simulating using knife and fork with max difficulty. Pt was shown rocker knife and she as able to use sucessfully following instruction. Pt was provided with information for purchase. Gentle composite finger passive ROM to bilateral hands.    4/15/25Paraffin to RUE x 10 mins, no adverse reactions, while therapist performed US  to LUE 3.3 mhz, 0.8 w/cm 2,  20%x 8 mins no adverse reactions for stiffness. Paraffin to LUE x 10 mins, no adverse reactions, while therapist performed US  to RUE 3.3 mhz, 0.8 w/cm 2,  20%x 8 mins no adverse reactions for stiffness.Gentle PIP blocking and passive ROM individual and composite finger flexion followed by finger  thumb flexion bilateral UE's. Therapist checked progress towards goals and took updated measurements then discussed plans for renewal and updated goals. See goals   08/08/23-Paraffin to RUE x 10 mins, no adverse reactions, while therapist performed US  to LUE 3.3 mhz, 0.8 w/cm 2,  20%x 8 mins no adverse reactions for stiffness. Therapist discussed possible  modifcations for eating with pt such as holding fork or spoon between digits, raising height of what she is eating by placing on  a book, supporting right elbow with a pillow. Paraffin to LUE x 10 mins, no adverse reactions, while therapist performed US  to RUE 3.3 mhz, 0.8 w/cm 2,  20%x 8 mins no adverse reactions for stiffness.Gentle PIP blocking and passive ROM individual and composite finger flexion followed by finger  thumb flexion bilateral UE's. Pt reports hands are less painful at end of session.  08/03/23-Paraffin to RUE x 10 mins, no adverse reactions, while therapist performed US  to LUE 3.3 mhz, 0.8 w/cm 2,  20%x 8 mins no adverse reactions for stiffness. Paraffin to LUE x 10 mins, no adverse reactions, while therapist performed US  to RUE 3.3 mhz, 0.8 w/cm 2,  20%x 8 mins no adverse reactions for stiffness. Gentle PIP blocking and passive ROM composite finger flexion followed by finger thumb opposition and thumb flexion bilateral UE's. Pt with decreased pain and improved flexibility after US  today.   08/01/23 Paraffin to left hand x 10 mins while pt performed A/ROM  for RUE: blocking exercises followed by P/ROM composite flexion to digits individually and compositely, A/ROM thumb flexion/ extension and finger thumb opposition. Paraffin to RUE x 10 mins  while LUE A/ROM blocking exercises followed by P/ROM composite flexion to digits individually and compositely, finger thumb opposition and thumb flexion. Yelllow putty exercises for sustained grip and pinch for bilateral UE's min v.c individual tip pinch for digits with exception of middle digit right hand. Pt rpeorts using her food processor to cut vegetables for soup. Pt to bring in her pick next visit for adaptation.                                                                               PATIENT EDUCATION: Education details:  yellow putty HEP review Person educated: Patient Education method: Explanation, demonstration, v.c Education  comprehension: verbalized understanding, returned demonstration  HOME EXERCISE PROGRAM: AROM yellow putty  GOALS: Goals reviewed with patient? Yes  SHORT TERM GOALS: Target date: 09/14/23  I with HEP  Goal status:  met 06/07/23  2.  I with positioning/ splinting prn to minimize pain and defomity   Goal status: met, 07/13/23  3.  Pt will increase bilateral grip strength by 5 lbs for increased UE functional use. Upgraded goal Pt will increase bilateral grip strength to at least 25 lbs (after stretching). Baseline: 05/30/23- RUE 8 lbs, LUE 4 lbs Goal status:met 08/01/23-R 20 lbs, L  19 lbs,  08/15/23- RUE 18 lbsLUE 14 lbs continue to address upgraded goal.  4. Pt will demonstrate improved RUE fine motor coordiantion as evidenced by perfroming 9 hole peg test in 23 secs or less.  Goal status: new 5. I with updates to HEP.  Goal status: new 6. Pt will demonstrate improved composite finger flexion for ADLs as evidenced by pt. bringing middle fingertip for RUE within 1/2 an inch of palm.  Baseline: 3/4 inch from palm to middle fingertip  Goal status : new 7.Pt will demonstrate improved composite finger flexion for ADLs as evidenced by pt. bringing middle fingertip for LUE within 3/4 an inch of palm.  Baseline: 1 inch from palm to middle finger tip  Goal status: new  LONG TERM GOALS: Target date:11/07/23  I with  updated HEP  Goal status:met 08/15/22  2.  Pt will improve Quick Dash score to 70% disability Baseline: 75% (05/30/23) Goal status:   improved - however not fully met due to complications from shoulder pain-70.5%08/15/23  3.  Pt will demonstrate at least 65% composte flexion for LUE for increased functional use.  Goal status: met 65-70% , 08/15/23  4.  I with adapted strategies/ adapted equipment to minimize pain and to increase pt I with ADLs/IADLs  Goal status:  met for inital strategies, however continue goal as pt may report additional tasks that can benefit from  modification.08/15/23  5.  Pt will demonstrate at least 65% composite flexion for RUE for increased functional use.  Goal status: met 70% 08/15/23  6. Pt will report that she is able to cut her meat such as steak or chicken modified independently.  [place]: unable  Goal status: new    7. Pt will report increased ease with opening/ closing ziplock bags.    Goal status: new    8. Pt will improve bilateral tip pinch by 2 lbs for increased ease with daily activities.     Goal status: new    9. Pt will improve bilateral lateral pinch by 2 lbs for increased ease with ADLs.    Goal status: new    ASSESSMENT: CLINICAL IMPRESSION: Pt is progressing towards goals. She demonstrates improved bilateral flexibility for composite finger flexion and decreased overall pain. She sees MD regarding shoulder pain today.Aaron AasPERFORMANCE DEFICITS: in functional skills including ADLs, IADLs, coordination, sensation, edema, ROM, strength, pain, flexibility, Fine motor control, Gross motor control, endurance, decreased knowledge of precautions, decreased knowledge of use of DME, and UE functional use,, and psychosocial skills including coping strategies, environmental adaptation, habits, interpersonal interactions, and routines and behaviors.   IMPAIRMENTS: are limiting patient from ADLs, IADLs, rest and sleep, education, play, leisure, and social participation.   COMORBIDITIES: may have co-morbidities  that affects occupational performance. Patient will benefit from skilled OT to address above impairments and improve overall function.  MODIFICATION OR ASSISTANCE TO COMPLETE EVALUATION: No modification of tasks or assist necessary to complete an evaluation.  OT OCCUPATIONAL PROFILE AND HISTORY: Detailed assessment: Review of records and additional review of physical, cognitive, psychosocial history related to current functional performance.  CLINICAL DECISION MAKING: LOW - limited treatment options, no task  modification necessary  REHAB POTENTIAL: Good  EVALUATION COMPLEXITY: Low      PLAN:  OT FREQUENCY: 2x/week  OT DURATION: 12 weeks( anticipate d/c after 8 weeks dependent on progress)  PLANNED INTERVENTIONS: 57846 OT Re-evaluation, 97535 self care/ADL training, 96295 therapeutic exercise, 97530 therapeutic activity, 97112 neuromuscular re-education, 97140 manual therapy, 97113 aquatic therapy, 97035 ultrasound, 97018 paraffin, 28413 fluidotherapy, 97010 moist heat, 97010 cryotherapy, 97034 contrast bath, 97760 Orthotics management and training, 24401 Splinting (initial encounter), [id number] Subsequent splinting/medication, passive range of motion, energy conservation, coping strategies training, patient/family education, and DME and/or AE instructions  RECOMMENDED OTHER SERVICES: none  CONSULTED AND AGREED WITH PLAN OF CARE: Patient  PLAN FOR NEXT SESSION:  continue modalities, gentle strenghtening, check on additional AE needs.   Kodie Pick, OT 08/29/2023, 10:45 AM

## 2023-08-31 ENCOUNTER — Encounter: Payer: Self-pay | Admitting: Physical Therapy

## 2023-08-31 ENCOUNTER — Ambulatory Visit: Attending: Family | Admitting: Physical Therapy

## 2023-08-31 DIAGNOSIS — M25511 Pain in right shoulder: Secondary | ICD-10-CM | POA: Diagnosis not present

## 2023-08-31 DIAGNOSIS — M542 Cervicalgia: Secondary | ICD-10-CM | POA: Diagnosis not present

## 2023-08-31 DIAGNOSIS — M6281 Muscle weakness (generalized): Secondary | ICD-10-CM | POA: Diagnosis not present

## 2023-08-31 DIAGNOSIS — R600 Localized edema: Secondary | ICD-10-CM | POA: Insufficient documentation

## 2023-08-31 DIAGNOSIS — M79641 Pain in right hand: Secondary | ICD-10-CM | POA: Insufficient documentation

## 2023-08-31 DIAGNOSIS — G8929 Other chronic pain: Secondary | ICD-10-CM | POA: Insufficient documentation

## 2023-08-31 DIAGNOSIS — R278 Other lack of coordination: Secondary | ICD-10-CM | POA: Diagnosis not present

## 2023-08-31 DIAGNOSIS — R252 Cramp and spasm: Secondary | ICD-10-CM | POA: Diagnosis not present

## 2023-08-31 DIAGNOSIS — M25641 Stiffness of right hand, not elsewhere classified: Secondary | ICD-10-CM | POA: Insufficient documentation

## 2023-08-31 DIAGNOSIS — M25642 Stiffness of left hand, not elsewhere classified: Secondary | ICD-10-CM | POA: Insufficient documentation

## 2023-08-31 DIAGNOSIS — M79642 Pain in left hand: Secondary | ICD-10-CM | POA: Insufficient documentation

## 2023-08-31 DIAGNOSIS — M25611 Stiffness of right shoulder, not elsewhere classified: Secondary | ICD-10-CM | POA: Diagnosis not present

## 2023-08-31 NOTE — Therapy (Signed)
 OUTPATIENT PHYSICAL THERAPY SHOULDER    Patient Name: Veronica Galvan MRN: [id number] DOB:[birth year], 68 y.o., female Today's Date: 08/31/2023  END OF SESSION:  PT End of Session - 08/31/23 1456     Visit Number 63    Date for PT Re-Evaluation 10/01/23    Authorization Type [employer] [place]    PT Start Time 1445    PT Stop Time 1530    PT Time Calculation (min) 45 min    Activity Tolerance Patient tolerated treatment well    Behavior During Therapy WFL for tasks assessed/performed             Past Medical History:  Diagnosis Date   Allergy     Anxiety    Arthritis    hands   Cataract    Diabetes mellitus without complication (HCC)    type 2   Family history of adverse reaction to anesthesia    son has malignant hyperthermia, daughter does not daughter recently had c section without problems   GERD (gastroesophageal reflux disease)    Headache    sinus   Hyperlipidemia    Hypertension    PONV (postoperative nausea and vomiting)    nausea only   Ulcer, stomach peptic yrs ago   Past Surgical History:  Procedure Laterality Date   APPENDECTOMY     both hells bone spur repair     both heels with metal clips   both shoulder rotator cuff repair     CESAREAN SECTION     x 1   COLONOSCOPY WITH PROPOFOL  N/A 09/07/2016   Procedure: COLONOSCOPY WITH PROPOFOL ;  Surgeon: Garrett Kallman, MD;  Location: [facility];  Service: Endoscopy;  Laterality: N/A;   colonscopy  06/2011   polyps   ELBOW FRACTURE SURGERY Left    EYE SURGERY     FRACTURE SURGERY     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/10/2022   Procedure: REVERSE SHOULDER ARTHROPLASTY;  Surgeon: Ellard Gunning, MD;  Location: [facility];  Service: Orthopedics;  Laterality: Right;  Please follow in room 6 if able   TUBAL LIGATION     VESICO-VAGINAL FISTULA REPAIR     Patient Active Problem List   Diagnosis Date Noted   Gastroesophageal reflux disease 11/16/2021   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 08/18/2021   Dermatofibroma 08/18/2021   History of malignant neoplasm of skin 08/18/2021   Lentigo 08/18/2021   Melanocytic nevi of trunk 08/18/2021   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   Rosacea 08/18/2021   Actinic keratosis 08/18/2021   Sensorineural hearing loss (SNHL) of left ear with unrestricted hearing of right ear 07/26/2021   Tinnitus of left ear 07/26/2021   Acute recurrent maxillary sinusitis 06/22/2021   Primary hypertension 01/12/2021   Hyperlipidemia associated with type 2 diabetes mellitus (HCC) 01/12/2021   Arthritis 01/12/2021   Type II diabetes mellitus (HCC) 11/25/2019   Ingrown toenail 09/05/2018   Neck pain 01/29/2018   Tick bite 10/24/2017    PCP: Ike Malady, [name]  REFERRING PROVIDER: Alfredo Ano, MD  REFERRING DIAG: s/p right reverse TSA  THERAPY DIAG:  Muscle weakness (generalized)  Stiffness of right shoulder, not elsewhere classified  Localized edema  Cervicalgia  Rationale for Evaluation and Treatment: Rehabilitation  ONSET DATE: 11/05/22  SUBJECTIVE:  SUBJECTIVE STATEMENT:  Had a bone scan, negative per radiology, but MD (Supple) note reports he feels good be a loosened prosthetic, is scheduled for an US  June 17th PAIN:  Are you having pain? Yes: NPRS scale: 6/10 Pain location: right shoulder, HA Pain description: dull ache at rest, sharp with motions Aggravating factors: quick motions, movements pain up to 10/10 Relieving factors: ice, rest, Tylenol  at best 3/10  PRECAUTIONS: Shoulder Protocol in the chart  RED FLAGS: None   WEIGHT BEARING RESTRICTIONS: No  FALLS:  Has patient fallen in last 6 months? Yes. Number of falls 1  LIVING ENVIRONMENT: Lives with: lives alone Lives in: House/apartment Stairs: No Has following equipment  at home: None  OCCUPATION: retired  PLOF: Independent  PATIENT GOALS:dress without difficulty, do hair, have good ROM and less pain  NEXT MD VISIT:   OBJECTIVE:   DIAGNOSTIC FINDINGS:  See above  PATIENT SURVEYS:  FOTO 21  COGNITION: Overall cognitive status: Within functional limits for tasks assessed     SENSATION: WFL  POSTURE: Fwd head, rounded shoulders, elevated and guarded shoulder  UPPER EXTREMITY ROM:   Active ROM Right PROM eval Right AROM  08/31/23 PROM Supine 01/05/23 PROM  01/26/23 PROM 02/09/23 AROM sitting 02/14/23 AROM 02/21/23 AROM  03/02/23 AROM  03/22/23 AAROM 03/28/23 AROM Standing 04/13/23 AROM Standing 05/02/23 AROM  Standing 05/11/23 AROM  Standing 05/25/23 AROM Standing 06/01/23 AROM Standing  07/06/23 AROM  Standing 07/27/23 AROM Standing 08/31/23  Shoulder flexion 35 90 118  123 90 96 95 100 111 110 112 120 125  129 132 130 105  Shoulder extension 5                   Shoulder abduction 20 95 90 92  73 80 83 90 93 90 92 98 100 102  105 95 81  Shoulder adduction                    Shoulder internal rotation 15 35            40 40  42 25  Shoulder external rotation 20 65 45 30  41  45 50 53 56 56 60 66 67 70 70  47  Elbow flexion 120                   Elbow extension 5                   Wrist flexion                    Wrist extension                    Wrist ulnar deviation                    Wrist radial deviation                    Wrist pronation                    Wrist supination                    (Blank rows = not tested)  UPPER EXTREMITY MMT:  No tested due to recent surgery  MMT Right eval Left eval  Shoulder flexion    Shoulder extension    Shoulder abduction    Shoulder adduction    Shoulder internal rotation    Shoulder  external rotation    Middle trapezius    Lower trapezius    Elbow flexion    Elbow extension    Wrist flexion    Wrist extension    Wrist ulnar deviation    Wrist radial deviation    Wrist  pronation    Wrist supination    Grip strength (lbs)    (Blank rows = not tested)  PALPATION:  Very tight and tender in the pectoral, upper trap, the entire right upper arm   TODAY'S TREATMENT:                                                                                                                                         DATE:  08/31/23 Discussion as noted below, measured both shoulder AROM in standing as noted above STM to the right upper arm and shoulder Passive stretch of the right shoulder to her point of pain Vaso bilateral shoulders for pain medium pressure   08/03/23 Pateint had a lot of questions regarding the bloodwork and the bone scan and what they are looking for, I strongly advised her to follow the MD/PA orders to wear sling and not use the arm, I did my best to answer her questions.  Finished with Vaso medium pressure 34 degrees  07/27/23 Gentle shoulder distractions Gentle PROM to her tolerance, a lot of cues to relax Neural tension stretches of the right UE STM to the upper trap, neck and rhomboid STM to the biceps, deltoid and pectoral area  07/25/23 UBE level 3 x 4 minutes for mm perfusion Lats 15# too painful Seated row 10# Doorway stretch PROM right shoulder all motions to tolerance STM to the tight upper arm and the right pectoral area  07/20/23 UBE level 3 x 4 minutes Ball vs wall Rolling ball up wall Small weighted ball toss 2 ways and then with her doing ER Passive stretch right shoulder STM to the right shoulder and upper arm  07/18/23 Passive stretch right shoulder Gentle joint mobs  UE neural tension stretches STM to the right pectoral, right biceps, deltoid area Wall slides Wall circles Doorway stretch  PATIENT EDUCATION: Education details: poc/hep Person educated: Patient Education method: Programmer, multimedia, Facilities manager, Actor cues, Verbal cues, and Handouts Education comprehension: verbalized understanding  HOME EXERCISE  PROGRAM: Access Code: [email or web address] URL: [email or web address] Date: 12/20/2022 Prepared by: Cherylene Corrente  Exercises - Seated Shoulder Flexion Towel Slide at Table Top  - 2 x daily - 7 x weekly - 2 sets - 10 reps - 3 hold - Seated Shoulder External Rotation PROM on Table  - 2 x daily - 7 x weekly - 2 sets - 10 reps - 3 hold - Seated Elbow Extension and Shoulder External Rotation AAROM at Table with Towel  - 2 x daily - 7 x weekly - 2 sets - 10 reps - 3 hold - Circular Shoulder Pendulum with Table Support  -  2 x daily - 7 x weekly - 2 sets - 10 reps - 3 hold  Access Code: [email or web address] URL: [email or web address] Date: 07/13/2023 Prepared by: Cherylene Corrente  Exercises - Isometric Shoulder Flexion at Wall  - 1 x daily - 7 x weekly - 1 sets - 10 reps - 3 hold - Standing Isometric Shoulder Internal Rotation at Doorway  - 1 x daily - 7 x weekly - 1 sets - 10 reps - 3 hold - Isometric Shoulder Extension at Wall  - 1 x daily - 7 x weekly - 1 sets - 10 reps - 3 hold - Isometric Shoulder Abduction at Wall  - 1 x daily - 7 x weekly - 1 sets - 10 reps - 3 hold - Standing Isometric Shoulder External Rotation with Doorway  - 1 x daily - 7 x weekly - 1 sets - 10 reps - 3 hold  ASSESSMENT:  CLINICAL IMPRESSION: Patient saw surgeon, the bone scan said did not feel that there was infection or loosening however the surgeon is not so sure, he wants to do an US  to be sure, that is scheduled for the next few weeks, since she has not been to PT in a month I re-measured her ROM, she has lost at least 15 degrees of motion and more for all motions.  She is also having more pain in the left shoulder possibly from overdoing it, because she is trying to not use the right shoulder.  She has talked about this with her primary and I suggested that she see a sports medicine MD, she called while she was here and will have appointment with him next week.  As far as the right shoulder the Surgeon  stated in his notes to resume formal PT on a conservative basis to help with ROM and pain until the US  is performed   OBJECTIVE IMPAIRMENTS: cardiopulmonary status limiting activity, decreased activity tolerance, decreased endurance, decreased ROM, decreased strength, increased edema, increased muscle spasms, impaired flexibility, impaired UE functional use, improper body mechanics, postural dysfunction, and pain .  REHAB POTENTIAL: Good  CLINICAL DECISION MAKING: Evolving/moderate complexity  EVALUATION COMPLEXITY: Low   GOALS: Goals reviewed with patient? Yes  SHORT TERM GOALS: Target date: 01/01/23  Independent with initial HEP Goal status: 12/27/22 MET  LONG TERM GOALS: Target date: 03/22/23  Decrease pain 50% Goal status: progressing 07/25/23  2.  Dress without difficulty Goal status: progressing 07/25/23  3.  Do hair without difficulty Goal status: able to reach the top of head at times progressing 2/20/253/25/25 4.  Increase AROM right shoulder flexion to 130 degrees Goal status: 07/25/23 progressing 130 degrees with pain  5.  Increase right shoulder ER to 60 degrees Goal status: met 07/06/23  6.  Return to water  aerobics and or gym activity Goal status:progressing met 06/01/23  PLAN:  PT FREQUENCY: 1-2x/week  PT DURATION: 12 weeks  PLANNED INTERVENTIONS: Therapeutic exercises, Therapeutic activity, Neuromuscular re-education, Balance training, Gait training, Patient/Family education, Self Care, Joint mobilization, Dry Needling, Electrical stimulation, Cryotherapy, Vasopneumatic device, and Manual therapy  PLAN FOR NEXT SESSION:   will resume PT on the right shoulder conservatively to try to get back lost ROM and help with pain until she has US , She did get an appointment with sports med MD for next week to look at the left shoulder 08/31/2023, 2:58 PM [place] [facility] at [facility] [address] [facility] [place], [place], [address] Phone:  [phone]   Fax:  [phone]

## 2023-09-03 NOTE — Progress Notes (Unsigned)
 [facility] History and Physical   Primary Care Physician:  Adra Alanis, [name]   Reason for Procedure:  History of adenomatous colon polyps  Plan:    Surveillance colonoscopy     HPI: Veronica Galvan is a 68 y.o. female undergoing surveillance colonoscopy for a history of adenomatous colon polyps.  Last colonoscopy performed in 2018 showed 1 nonadvanced tubular adenoma.  Notation in chart that patient has previously had a history of colon polyps.  No family history of colorectal cancer or colon polyps.  Patient denies current symptoms of change in bowel habits or rectal bleeding.   Past Medical History:  Diagnosis Date   Allergy     Anxiety    Arthritis    hands   Cataract    Diabetes mellitus without complication (HCC)    type 2   Family history of adverse reaction to anesthesia    son has malignant hyperthermia, daughter does not daughter recently had c section without problems   GERD (gastroesophageal reflux disease)    Headache    sinus   Hyperlipidemia    Hypertension    PONV (postoperative nausea and vomiting)    nausea only   Ulcer, stomach peptic yrs ago    Past Surgical History:  Procedure Laterality Date   APPENDECTOMY     both hells bone spur repair     both heels with metal clips   both shoulder rotator cuff repair     CESAREAN SECTION     x 1   COLONOSCOPY WITH PROPOFOL  N/A 09/07/2016   Procedure: COLONOSCOPY WITH PROPOFOL ;  Surgeon: Garrett Kallman, MD;  Location: [facility];  Service: Endoscopy;  Laterality: N/A;   colonscopy  06/2011   polyps   ELBOW FRACTURE SURGERY Left    EYE SURGERY     FRACTURE SURGERY     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/10/2022   Procedure: REVERSE SHOULDER ARTHROPLASTY;  Surgeon: Ellard Gunning, MD;  Location: [facility];  Service: Orthopedics;  Laterality: Right;  Please follow in room 6 if able   TUBAL LIGATION     VESICO-VAGINAL FISTULA REPAIR      Prior to Admission medications    Medication Sig Start Date End Date Taking? Authorizing Provider  acetaminophen  (TYLENOL ) 650 MG CR tablet Take 1,300 mg by mouth every 8 (eight) hours as needed for pain.    [provider]  ALPRAZolam  (XANAX ) 0.5 MG tablet Take 1 tablet (0.5 mg total) by mouth at bedtime as needed for anxiety. 08/16/23   Webb, Padonda B, [name]  Ascorbic Acid (VITAMIN C) 1000 MG tablet Take 1,000 mg by mouth every morning.    [provider]  aspirin EC 81 MG tablet Take 81 mg by mouth every morning.    [provider]  atorvastatin  (LIPITOR) 40 MG tablet TAKE ONE TABLET BY MOUTH EVERY EVENING 07/31/23   Adra Alanis, [name]  Blood Glucose Monitoring Suppl DEVI 1 each by Does not apply route in the morning, at noon, and at bedtime. May substitute to any manufacturer covered by patient's insurance. 08/01/23   Adra Alanis, [name]  Calcium  Carb-Cholecalciferol (CALCIUM  600+D3 PO) Take 1 tablet by mouth every morning.    [provider]  calcium  carbonate (TUMS - DOSED IN MG ELEMENTAL CALCIUM ) 500 MG chewable tablet Chew 2 tablets by mouth daily as needed for indigestion or heartburn.    [provider]  carboxymethylcellulose (REFRESH TEARS) 0.5 % SOLN Place 1 drop into both eyes 2 (  two) times daily.    [provider]  celecoxib  (CELEBREX ) 200 MG capsule TAKE 1 CAPSULE BY MOUTH DAILY 12/26/22   Adra Alanis, [name]  Coenzyme Q10 300 MG CAPS Take 1 capsule by mouth every morning.    [provider]  diclofenac Sodium (VOLTAREN) 1 % GEL Apply 1 Application topically 2 (two) times daily.    [provider]  fluticasone  (FLONASE ) 50 MCG/ACT nasal spray Place 2 sprays into both nostrils daily. 06/20/23   Adra Alanis, [name]  glucose blood test strip Use as instructed 06/20/23   Adra Alanis, [name]  levocetirizine (XYZAL ) 5 MG tablet Take 1 tablet (5 mg total) by mouth every evening. 03/20/23   Adra Alanis, [name]   lisinopril  (ZESTRIL ) 5 MG tablet TAKE 1 TABLET BY MOUTH 2 TIMES A DAY 07/31/23   Adra Alanis, [name]  LUTEIN-ZEAXANTHIN PO Take 1 tablet by mouth every morning.    [provider]  MAGNESIUM CITRATE PO Take 500 mg by mouth daily.    [provider]  Multiple Vitamins-Minerals (MULTIVITAMIN WITH MINERALS) tablet Take 1 tablet by mouth every morning.    [provider]  Omega-3 Fatty Acids (FISH OIL) 1000 MG CAPS Take 2,000 mg by mouth daily.    [provider]  omeprazole  (PRILOSEC) 20 MG capsule TAKE 1 CAPSULE BY MOUTH EVERY MORNING 07/31/23   Adra Alanis, [name]  polyethylene glycol powder (GLYCOLAX /MIRALAX ) 17 GM/SCOOP powder Take 238 g by mouth daily. 08/07/23   Truddie Furrow, MD  Semaglutide ,0.25 or 0.5MG /DOS, (OZEMPIC , 0.25 OR 0.5 MG/DOSE,) 2 MG/3ML SOPN Inject 2 mg into the skin once a week.    [provider]  sodium chloride  (MURO 128) 2 % ophthalmic solution Place 1 drop into both eyes 2 (two) times daily.    [provider]  SYNJARDY  XR 12.08-998 MG TB24 Take 1 tablet by mouth 2 (two) times daily. 11/11/22   Adra Alanis, [name]  TURMERIC PO Take 2,000 mg by mouth daily.    [provider]  UNABLE TO FIND Take 1 tablet by mouth daily. Cinsulin supplement    [provider]  vitamin E 180 MG (400 UNITS) capsule Take 400 Units by mouth daily.    [provider]    Current Outpatient Medications  Medication Sig Dispense Refill   acetaminophen  (TYLENOL ) 650 MG CR tablet Take 1,300 mg by mouth every 8 (eight) hours as needed for pain.     ALPRAZolam  (XANAX ) 0.5 MG tablet Take 1 tablet (0.5 mg total) by mouth at bedtime as needed for anxiety. 30 tablet 0   Ascorbic Acid (VITAMIN C) 1000 MG tablet Take 1,000 mg by mouth every morning.     aspirin EC 81 MG tablet Take 81 mg by mouth every morning.     atorvastatin  (LIPITOR) 40 MG tablet TAKE ONE TABLET BY MOUTH EVERY EVENING 90 tablet 1    Blood Glucose Monitoring Suppl DEVI 1 each by Does not apply route in the morning, at noon, and at bedtime. May substitute to any manufacturer covered by patient's insurance. 1 each 0   Calcium  Carb-Cholecalciferol (CALCIUM  600+D3 PO) Take 1 tablet by mouth every morning.     calcium  carbonate (TUMS - DOSED IN MG ELEMENTAL CALCIUM ) 500 MG chewable tablet Chew 2 tablets by mouth daily as needed for indigestion or heartburn.     carboxymethylcellulose (REFRESH TEARS) 0.5 % SOLN Place 1 drop into both eyes 2 (two) times daily.  celecoxib  (CELEBREX ) 200 MG capsule TAKE 1 CAPSULE BY MOUTH DAILY 90 capsule 3   Coenzyme Q10 300 MG CAPS Take 1 capsule by mouth every morning.     diclofenac Sodium (VOLTAREN) 1 % GEL Apply 1 Application topically 2 (two) times daily.     fluticasone  (FLONASE ) 50 MCG/ACT nasal spray Place 2 sprays into both nostrils daily. 16 g 6   glucose blood test strip Use as instructed 100 each 12   levocetirizine (XYZAL ) 5 MG tablet Take 1 tablet (5 mg total) by mouth every evening. 90 tablet 1   lisinopril  (ZESTRIL ) 5 MG tablet TAKE 1 TABLET BY MOUTH 2 TIMES A DAY 180 tablet 1   LUTEIN-ZEAXANTHIN PO Take 1 tablet by mouth every morning.     MAGNESIUM CITRATE PO Take 500 mg by mouth daily.     Multiple Vitamins-Minerals (MULTIVITAMIN WITH MINERALS) tablet Take 1 tablet by mouth every morning.     Omega-3 Fatty Acids (FISH OIL) 1000 MG CAPS Take 2,000 mg by mouth daily.     omeprazole  (PRILOSEC) 20 MG capsule TAKE 1 CAPSULE BY MOUTH EVERY MORNING 90 capsule 1   polyethylene glycol powder (GLYCOLAX /MIRALAX ) 17 GM/SCOOP powder Take 238 g by mouth daily. 255 g 3   Semaglutide ,0.25 or 0.5MG /DOS, (OZEMPIC , 0.25 OR 0.5 MG/DOSE,) 2 MG/3ML SOPN Inject 2 mg into the skin once a week.     sodium chloride  (MURO 128) 2 % ophthalmic solution Place 1 drop into both eyes 2 (two) times daily.     SYNJARDY  XR 12.08-998 MG TB24 Take 1 tablet by mouth 2 (two) times daily. 180 tablet 3   TURMERIC PO  Take 2,000 mg by mouth daily.     UNABLE TO FIND Take 1 tablet by mouth daily. Cinsulin supplement     vitamin E 180 MG (400 UNITS) capsule Take 400 Units by mouth daily.     No current facility-administered medications for this visit.    Allergies as of 09/04/2023 - Review Complete 08/31/2023  Allergen Reaction Noted   Codeine Hives and Itching 08/24/2016   Benzonatate Rash 04/20/2017   Epinephrine Palpitations 08/24/2016    Family History  Problem Relation Age of Onset   Hypertension Mother    COPD Mother    Cancer Mother    Arthritis Mother    Hypertension Father    Hyperlipidemia Father    Diabetes Father    Cancer Father    Alcohol abuse Father    Diabetes Brother    Alcohol abuse Brother    Hypertension Brother    Arthritis Maternal Grandmother    Birth defects Maternal Grandmother    Diabetes Paternal Grandmother    Heart disease Paternal Grandfather    Diabetes Paternal Aunt    Diabetes Paternal Aunt    Obesity Son     Social History   Socioeconomic History   Marital status: Widowed    Spouse name: Not on file   Number of children: Not on file   Years of education: Not on file   Highest education level: Bachelor's degree (e.g., BA, AB, BS)  Occupational History   Not on file  Tobacco Use   Smoking status: Former    Current packs/day: 1.00    Average packs/day: 1 pack/day for 14.0 years (14.0 ttl pk-yrs)    Types: Cigarettes   Smokeless tobacco: Never   Tobacco comments:    quit 31 yrs ago  Substance and Sexual Activity   Alcohol use: Yes    Comment: rare  Drug use: No   Sexual activity: Not Currently    Birth control/protection: Post-menopausal  Other Topics Concern   Not on file  Social History Narrative   Right handed   Caffeine intake none   Social Drivers of Health   Financial Resource Strain: Low Risk  (05/12/2023)   Overall Financial Resource Strain (CARDIA)    Difficulty of Paying Living Expenses: Not hard at all  Food Insecurity:  No Food Insecurity (05/12/2023)   Hunger Vital Sign    Worried About Running Out of Food in the Last Year: Never true    Ran Out of Food in the Last Year: Never true  Transportation Needs: No Transportation Needs (05/12/2023)   PRAPARE - Administrator, Civil Service (Medical): No    Lack of Transportation (Non-Medical): No  Physical Activity: Insufficiently Active (05/12/2023)   Exercise Vital Sign    Days of Exercise per Week: 4 days    Minutes of Exercise per Session: 30 min  Stress: No Stress Concern Present (05/12/2023)   [employer] of Occupational Health - Occupational Stress Questionnaire    Feeling of Stress : Only a little  Social Connections: Moderately Integrated (05/12/2023)   Social Connection and Isolation Panel [NHANES]    Frequency of Communication with Friends and Family: More than three times a week    Frequency of Social Gatherings with Friends and Family: More than three times a week    Attends Religious Services: More than 4 times per year    Active Member of [employer] or Organizations: Yes    Attends Banker Meetings: More than 4 times per year    Marital Status: Widowed  Intimate Partner Violence: Not At Risk (08/29/2022)   Humiliation, Afraid, Rape, and Kick questionnaire    Fear of Current or Ex-Partner: No    Emotionally Abused: No    Physically Abused: No    Sexually Abused: No    Review of Systems:  All other review of systems negative except as mentioned in the HPI.  Physical Exam: Vital signs There were no vitals taken for this visit.  General:   Alert,  Well-developed, well-nourished, pleasant and cooperative in NAD Airway:  Mallampati  Lungs:  Clear throughout to auscultation.   Heart:  Regular rate and rhythm; no murmurs, clicks, rubs,  or gallops. Abdomen:  Soft, nontender and nondistended. Normal bowel sounds.   Neuro/Psych:  Normal mood and affect. A and O x 3  Eugenia Hess, MD [facility]

## 2023-09-04 ENCOUNTER — Ambulatory Visit (AMBULATORY_SURGERY_CENTER): Payer: Medicare Other | Admitting: Pediatrics

## 2023-09-04 ENCOUNTER — Encounter: Payer: Self-pay | Admitting: Pediatrics

## 2023-09-04 VITALS — BP 169/71 | HR 78 | Temp 97.9°F | Resp 14 | Ht 61.0 in | Wt 192.0 lb

## 2023-09-04 DIAGNOSIS — Z860100 Personal history of colon polyps, unspecified: Secondary | ICD-10-CM

## 2023-09-04 DIAGNOSIS — K648 Other hemorrhoids: Secondary | ICD-10-CM

## 2023-09-04 DIAGNOSIS — K5730 Diverticulosis of large intestine without perforation or abscess without bleeding: Secondary | ICD-10-CM | POA: Diagnosis not present

## 2023-09-04 DIAGNOSIS — D124 Benign neoplasm of descending colon: Secondary | ICD-10-CM | POA: Diagnosis not present

## 2023-09-04 DIAGNOSIS — D123 Benign neoplasm of transverse colon: Secondary | ICD-10-CM

## 2023-09-04 DIAGNOSIS — Z1211 Encounter for screening for malignant neoplasm of colon: Secondary | ICD-10-CM

## 2023-09-04 MED ORDER — SODIUM CHLORIDE 0.9 % IV SOLN
500.0000 mL | Freq: Once | INTRAVENOUS | Status: DC
Start: 2023-09-04 — End: 2023-09-04

## 2023-09-04 NOTE — Patient Instructions (Signed)
YOU HAD AN ENDOSCOPIC PROCEDURE TODAY AT THE [facility]:   Refer to the procedure report that was given to you for any specific questions about what was found during the examination.  If the procedure report does not answer your questions, please call your gastroenterologist to clarify.  If you requested that your care partner not be given the details of your procedure findings, then the procedure report has been included in a sealed envelope for you to review at your convenience later.  YOU SHOULD EXPECT: Some feelings of bloating in the abdomen. Passage of more gas than usual.  Walking can help get rid of the air that was put into your GI tract during the procedure and reduce the bloating. If you had a lower endoscopy (such as a colonoscopy or flexible sigmoidoscopy) you may notice spotting of blood in your stool or on the toilet paper. If you underwent a bowel prep for your procedure, you may not have a normal bowel movement for a few days.  Please Note:  You might notice some irritation and congestion in your nose or some drainage.  This is from the oxygen used during your procedure.  There is no need for concern and it should clear up in a day or so.  SYMPTOMS TO REPORT IMMEDIATELY:  Following lower endoscopy (colonoscopy or flexible sigmoidoscopy):  Excessive amounts of blood in the stool  Significant tenderness or worsening of abdominal pains  Swelling of the abdomen that is new, acute  Fever of 100F or higher  Following upper endoscopy (EGD)  Vomiting of blood or coffee ground material  New chest pain or pain under the shoulder blades  Painful or persistently difficult swallowing  New shortness of breath  Fever of 100F or higher  Black, tarry-looking stools  For urgent or emergent issues, a gastroenterologist can be reached at any hour by calling [phone]. Do not use MyChart messaging for urgent concerns.    DIET:  We do recommend a small meal at first, but  then you may proceed to your regular diet.  Drink plenty of fluids but you should avoid alcoholic beverages for 24 hours.  ACTIVITY:  You should plan to take it easy for the rest of today and you should NOT DRIVE or use heavy machinery until tomorrow (because of the sedation medicines used during the test).    FOLLOW UP: Our staff will call the number listed on your records the next business day following your procedure.  We will call around 7:15- 8:00 am to check on you and address any questions or concerns that you may have regarding the information given to you following your procedure. If we do not reach you, we will leave a message.     If any biopsies were taken you will be contacted by phone or by letter within the next 1-3 weeks.  Please call us at [phone] if you have not heard about the biopsies in 3 weeks.    SIGNATURES/CONFIDENTIALITY: You and/or your care partner have signed paperwork which will be entered into your electronic medical record.  These signatures attest to the fact that that the information above on your After Visit Summary has been reviewed and is understood.  Full responsibility of the confidentiality of this discharge information lies with you and/or your care-partner.  

## 2023-09-04 NOTE — Progress Notes (Signed)
 Pt's states no medical or surgical changes since previsit or office visit.

## 2023-09-04 NOTE — Progress Notes (Signed)
 0938 BP 198/76, Labetalol given IV, MD update, vss

## 2023-09-04 NOTE — Op Note (Signed)
 [facility] Patient Name: Veronica Galvan Procedure Date: 09/04/2023 9:26 AM MRN: [id number] Endoscopist: Eugenia Hess , MD, [id number] Age: 68 Referring MD:  Date of Birth: 10/12/1955 Gender: Female Account #: [id number] Procedure:                Colonoscopy Indications:              High risk colon cancer surveillance: Personal                            history of non-advanced adenoma, Last colonoscopy:                            May 2018 Medicines:                Monitored Anesthesia Care Procedure:                Pre-Anesthesia Assessment:                           - Prior to the procedure, a History and Physical                            was performed, and patient medications and                            allergies were reviewed. The patient's tolerance of                            previous anesthesia was also reviewed. The risks                            and benefits of the procedure and the sedation                            options and risks were discussed with the patient.                            All questions were answered, and informed consent                            was obtained. Prior Anticoagulants: The patient has                            taken no anticoagulant or antiplatelet agents. ASA                            Grade Assessment: II - A patient with mild systemic                            disease. After reviewing the risks and benefits,                            the patient was deemed in satisfactory condition to  undergo the procedure.                           After obtaining informed consent, the colonoscope                            was passed under direct vision. Throughout the                            procedure, the patient's blood pressure, pulse, and                            oxygen saturations were monitored continuously. The                            CF [id number] #[id number] was introduced through the anus                             and advanced to the cecum, identified by                            appendiceal orifice and ileocecal valve. The                            colonoscopy was performed without difficulty. The                            patient tolerated the procedure well. The quality                            of the bowel preparation was good. The ileocecal                            valve, appendiceal orifice, and rectum were                            photographed. Scope In: 9:39:35 AM Scope Out: 9:54:19 AM Scope Withdrawal Time: 0 hours 9 minutes 48 seconds  Total Procedure Duration: 0 hours 14 minutes 44 seconds  Findings:                 The perianal examination was normal.                           The digital rectal exam was normal. Pertinent                            negatives include normal sphincter tone and no                            palpable rectal lesions.                           A few small-mouthed diverticula were found in the  sigmoid colon and descending colon.                           A 4 mm polyp was found in the transverse colon. The                            polyp was sessile. The polyp was removed with a                            cold biopsy forceps. Resection and retrieval were                            complete.                           A 5 mm polyp was found in the descending colon. The                            polyp was sessile. The polyp was removed with a                            cold snare. Resection and retrieval were complete.                           Internal hemorrhoids were found during retroflexion. Complications:            No immediate complications. Estimated blood loss:                            Minimal. Estimated Blood Loss:     Estimated blood loss was minimal. Impression:               - Diverticulosis in the sigmoid colon and in the                            descending colon.                            - One 4 mm polyp in the transverse colon, removed                            with a cold biopsy forceps. Resected and retrieved.                           - One 5 mm polyp in the descending colon, removed                            with a cold snare. Resected and retrieved.                           - Internal hemorrhoids. Recommendation:           - Discharge patient to home (ambulatory).                           -  Await pathology results.                           - Repeat colonoscopy for surveillance based on                            pathology results.                           - The findings and recommendations were discussed                            with the patient's family.                           - Return to referring physician.                           - Patient has a contact number available for                            emergencies. The signs and symptoms of potential                            delayed complications were discussed with the                            patient. Return to normal activities tomorrow.                            Written discharge instructions were provided to the                            patient. Eugenia Hess, MD 09/04/2023 9:59:17 AM This report has been signed electronically.

## 2023-09-04 NOTE — Progress Notes (Signed)
 0947 BP 201/96, Labetalol given IV, MD update, vss

## 2023-09-04 NOTE — Progress Notes (Signed)
 Report given to PACU, vss

## 2023-09-04 NOTE — Progress Notes (Signed)
 Called to room to assist during endoscopic procedure.  Patient ID and intended procedure confirmed with present staff. Received instructions for my participation in the procedure from the performing physician.

## 2023-09-05 ENCOUNTER — Encounter: Payer: Self-pay | Admitting: Occupational Therapy

## 2023-09-05 ENCOUNTER — Ambulatory Visit: Admitting: Occupational Therapy

## 2023-09-05 ENCOUNTER — Ambulatory Visit: Admitting: Physical Therapy

## 2023-09-05 ENCOUNTER — Encounter: Payer: Self-pay | Admitting: Physical Therapy

## 2023-09-05 ENCOUNTER — Telehealth: Payer: Self-pay

## 2023-09-05 DIAGNOSIS — R600 Localized edema: Secondary | ICD-10-CM | POA: Diagnosis not present

## 2023-09-05 DIAGNOSIS — G8929 Other chronic pain: Secondary | ICD-10-CM | POA: Diagnosis not present

## 2023-09-05 DIAGNOSIS — M79641 Pain in right hand: Secondary | ICD-10-CM

## 2023-09-05 DIAGNOSIS — M6281 Muscle weakness (generalized): Secondary | ICD-10-CM

## 2023-09-05 DIAGNOSIS — M542 Cervicalgia: Secondary | ICD-10-CM | POA: Diagnosis not present

## 2023-09-05 DIAGNOSIS — M25642 Stiffness of left hand, not elsewhere classified: Secondary | ICD-10-CM

## 2023-09-05 DIAGNOSIS — M79642 Pain in left hand: Secondary | ICD-10-CM

## 2023-09-05 DIAGNOSIS — M25641 Stiffness of right hand, not elsewhere classified: Secondary | ICD-10-CM | POA: Diagnosis not present

## 2023-09-05 DIAGNOSIS — M25511 Pain in right shoulder: Secondary | ICD-10-CM | POA: Diagnosis not present

## 2023-09-05 DIAGNOSIS — R278 Other lack of coordination: Secondary | ICD-10-CM

## 2023-09-05 DIAGNOSIS — M25611 Stiffness of right shoulder, not elsewhere classified: Secondary | ICD-10-CM

## 2023-09-05 DIAGNOSIS — R252 Cramp and spasm: Secondary | ICD-10-CM | POA: Diagnosis not present

## 2023-09-05 NOTE — Therapy (Signed)
 OUTPATIENT PHYSICAL THERAPY SHOULDER    Patient Name: Veronica Galvan MRN: [id number] DOB:Sep 25, 1955, 68 y.o., female Today's Date: 09/05/2023  END OF SESSION:  PT End of Session - 09/05/23 0755     Visit Number 64    Date for PT Re-Evaluation 10/01/23    Authorization Type [employer] [place]    PT Start Time [id number]    PT Stop Time 0843    PT Time Calculation (min) 48 min    Activity Tolerance Patient tolerated treatment well    Behavior During Therapy WFL for tasks assessed/performed             Past Medical History:  Diagnosis Date   Allergy     Anxiety    Arthritis    hands   Cataract    Diabetes mellitus without complication (HCC)    type 2   Family history of adverse reaction to anesthesia    son has malignant hyperthermia, daughter does not daughter recently had c section without problems   GERD (gastroesophageal reflux disease)    Headache    sinus   Hyperlipidemia    Hypertension    PONV (postoperative nausea and vomiting)    nausea only   Ulcer, stomach peptic yrs ago   Past Surgical History:  Procedure Laterality Date   APPENDECTOMY     both hells bone spur repair     both heels with metal clips   both shoulder rotator cuff repair     CESAREAN SECTION     x 1   COLONOSCOPY WITH PROPOFOL  N/A 09/07/2016   Procedure: COLONOSCOPY WITH PROPOFOL ;  Surgeon: Garrett Kallman, MD;  Location: [facility];  Service: Endoscopy;  Laterality: N/A;   colonscopy  06/2011   polyps   ELBOW FRACTURE SURGERY Left    EYE SURGERY     FRACTURE SURGERY     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/10/2022   Procedure: REVERSE SHOULDER ARTHROPLASTY;  Surgeon: Ellard Gunning, MD;  Location: [facility];  Service: Orthopedics;  Laterality: Right;  Please follow in room 6 if able   TUBAL LIGATION     VESICO-VAGINAL FISTULA REPAIR     Patient Active Problem List   Diagnosis Date Noted   Gastroesophageal reflux disease 11/16/2021   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 08/18/2021   Dermatofibroma 08/18/2021   History of malignant neoplasm of skin 08/18/2021   Lentigo 08/18/2021   Melanocytic nevi of trunk 08/18/2021   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   Rosacea 08/18/2021   Actinic keratosis 08/18/2021   Sensorineural hearing loss (SNHL) of left ear with unrestricted hearing of right ear 07/26/2021   Tinnitus of left ear 07/26/2021   Acute recurrent maxillary sinusitis 06/22/2021   Primary hypertension 01/12/2021   Hyperlipidemia associated with type 2 diabetes mellitus (HCC) 01/12/2021   Arthritis 01/12/2021   Type II diabetes mellitus (HCC) 11/25/2019   Ingrown toenail 09/05/2018   Neck pain 01/29/2018   Tick bite 10/24/2017    PCP: Ike Malady, [name]  REFERRING PROVIDER: Alfredo Ano, MD  REFERRING DIAG: s/p right reverse TSA  THERAPY DIAG:  Muscle weakness (generalized)  Stiffness of right shoulder, not elsewhere classified  Localized edema  Cervicalgia  Rationale for Evaluation and Treatment: Rehabilitation  ONSET DATE: 11/05/22  SUBJECTIVE:  SUBJECTIVE STATEMENT:  Awaiting to see sports med MD regarding the left shoulder, has an US  scheduled for the right shoulder June 17th.  Reports a colonoscopy yesterday reports a lot of neck pain today PAIN:  Are you having pain? Yes: NPRS scale: 6/10 Pain location: right shoulder, HA Pain description: dull ache at rest, sharp with motions Aggravating factors: quick motions, movements pain up to 10/10 Relieving factors: ice, rest, Tylenol  at best 3/10  PRECAUTIONS: Shoulder Protocol in the chart  RED FLAGS: None   WEIGHT BEARING RESTRICTIONS: No  FALLS:  Has patient fallen in last 6 months? Yes. Number of falls 1  LIVING ENVIRONMENT: Lives with: lives alone Lives in:  House/apartment Stairs: No Has following equipment at home: None  OCCUPATION: retired  PLOF: Independent  PATIENT GOALS:dress without difficulty, do hair, have good ROM and less pain  NEXT MD VISIT:   OBJECTIVE:   DIAGNOSTIC FINDINGS:  See above  PATIENT SURVEYS:  FOTO 21  COGNITION: Overall cognitive status: Within functional limits for tasks assessed     SENSATION: WFL  POSTURE: Fwd head, rounded shoulders, elevated and guarded shoulder  UPPER EXTREMITY ROM:   Active ROM Right PROM eval Right AROM  08/31/23 PROM Supine 01/05/23 PROM  01/26/23 PROM 02/09/23 AROM sitting 02/14/23 AROM 02/21/23 AROM  03/02/23 AROM  03/22/23 AAROM 03/28/23 AROM Standing 04/13/23 AROM Standing 05/02/23 AROM  Standing 05/11/23 AROM  Standing 05/25/23 AROM Standing 06/01/23 AROM Standing  07/06/23 AROM  Standing 07/27/23 AROM Standing 08/31/23  Shoulder flexion 35 90 118  123 90 96 95 100 111 110 112 120 125  129 132 130 105  Shoulder extension 5                   Shoulder abduction 20 95 90 92  73 80 83 90 93 90 92 98 100 102  105 95 81  Shoulder adduction                    Shoulder internal rotation 15 35            40 40  42 25  Shoulder external rotation 20 65 45 30  41  45 50 53 56 56 60 66 67 70 70  47  Elbow flexion 120                   Elbow extension 5                   Wrist flexion                    Wrist extension                    Wrist ulnar deviation                    Wrist radial deviation                    Wrist pronation                    Wrist supination                    (Blank rows = not tested)  UPPER EXTREMITY MMT:  No tested due to recent surgery  MMT Right eval Left eval  Shoulder flexion    Shoulder extension    Shoulder abduction    Shoulder adduction    Shoulder  internal rotation    Shoulder external rotation    Middle trapezius    Lower trapezius    Elbow flexion    Elbow extension    Wrist flexion    Wrist extension    Wrist  ulnar deviation    Wrist radial deviation    Wrist pronation    Wrist supination    Grip strength (lbs)    (Blank rows = not tested)  PALPATION:  Very tight and tender in the pectoral, upper trap, the entire right upper arm   TODAY'S TREATMENT:                                                                                                                                         DATE:  09/05/23 Nustep level 3 x 5 minutes UBE level 2 x 3 minutes US  to the left shoulder/upper arm STM to the neck and upper traps Passive stretch shoulders She then went to OT and after I put her on the VASO  08/31/23 Discussion as noted below, measured both shoulder AROM in standing as noted above STM to the right upper arm and shoulder Passive stretch of the right shoulder to her point of pain Vaso bilateral shoulders for pain medium pressure   08/03/23 Pateint had a lot of questions regarding the bloodwork and the bone scan and what they are looking for, I strongly advised her to follow the MD/PA orders to wear sling and not use the arm, I did my best to answer her questions.  Finished with Vaso medium pressure 34 degrees  07/27/23 Gentle shoulder distractions Gentle PROM to her tolerance, a lot of cues to relax Neural tension stretches of the right UE STM to the upper trap, neck and rhomboid STM to the biceps, deltoid and pectoral area  07/25/23 UBE level 3 x 4 minutes for mm perfusion Lats 15# too painful Seated row 10# Doorway stretch PROM right shoulder all motions to tolerance STM to the tight upper arm and the right pectoral area  07/20/23 UBE level 3 x 4 minutes Ball vs wall Rolling ball up wall Small weighted ball toss 2 ways and then with her doing ER Passive stretch right shoulder STM to the right shoulder and upper arm  07/18/23 Passive stretch right shoulder Gentle joint mobs  UE neural tension stretches STM to the right pectoral, right biceps, deltoid area Wall slides Wall  circles Doorway stretch  PATIENT EDUCATION: Education details: poc/hep Person educated: Patient Education method: Programmer, multimedia, Facilities manager, Actor cues, Verbal cues, and Handouts Education comprehension: verbalized understanding  HOME EXERCISE PROGRAM: Access Code: [email or web address] URL: [email or web address] Date: 12/20/2022 Prepared by: Cherylene Corrente  Exercises - Seated Shoulder Flexion Towel Slide at Table Top  - 2 x daily - 7 x weekly - 2 sets - 10 reps - 3 hold - Seated Shoulder External Rotation PROM on Table  - 2 x daily - 7  x weekly - 2 sets - 10 reps - 3 hold - Seated Elbow Extension and Shoulder External Rotation AAROM at Table with Towel  - 2 x daily - 7 x weekly - 2 sets - 10 reps - 3 hold - Circular Shoulder Pendulum with Table Support  - 2 x daily - 7 x weekly - 2 sets - 10 reps - 3 hold  Access Code: [email or web address] URL: [email or web address] Date: 07/13/2023 Prepared by: Cherylene Corrente  Exercises - Isometric Shoulder Flexion at Wall  - 1 x daily - 7 x weekly - 1 sets - 10 reps - 3 hold - Standing Isometric Shoulder Internal Rotation at Doorway  - 1 x daily - 7 x weekly - 1 sets - 10 reps - 3 hold - Isometric Shoulder Extension at Wall  - 1 x daily - 7 x weekly - 1 sets - 10 reps - 3 hold - Isometric Shoulder Abduction at Wall  - 1 x daily - 7 x weekly - 1 sets - 10 reps - 3 hold - Standing Isometric Shoulder External Rotation with Doorway  - 1 x daily - 7 x weekly - 1 sets - 10 reps - 3 hold  ASSESSMENT:  CLINICAL IMPRESSION:  Light exercise, ligth stretch and focus today was on STM to the neck and upper traps as she was very tight and very sore  Patient saw surgeon, the bone scan said did not feel that there was infection or loosening however the surgeon is not so sure, he wants to do an US  to be sure, that is scheduled for the next few weeks, since she has not been to PT in a month I re-measured her ROM, she has lost at least 15 degrees of  motion and more for all motions.  She is also having more pain in the left shoulder possibly from overdoing it, because she is trying to not use the right shoulder.  She has talked about this with her primary and I suggested that she see a sports medicine MD, she called while she was here and will have appointment with him next week.  As far as the right shoulder the Surgeon stated in his notes to resume formal PT on a conservative basis to help with ROM and pain until the US  is performed   OBJECTIVE IMPAIRMENTS: cardiopulmonary status limiting activity, decreased activity tolerance, decreased endurance, decreased ROM, decreased strength, increased edema, increased muscle spasms, impaired flexibility, impaired UE functional use, improper body mechanics, postural dysfunction, and pain .  REHAB POTENTIAL: Good  CLINICAL DECISION MAKING: Evolving/moderate complexity  EVALUATION COMPLEXITY: Low   GOALS: Goals reviewed with patient? Yes  SHORT TERM GOALS: Target date: 01/01/23  Independent with initial HEP Goal status: 12/27/22 MET  LONG TERM GOALS: Target date: 03/22/23  Decrease pain 50% Goal status: progressing 07/25/23  2.  Dress without difficulty Goal status: progressing 07/25/23  3.  Do hair without difficulty Goal status: able to reach the top of head at times progressing 2/20/253/25/25 4.  Increase AROM right shoulder flexion to 130 degrees Goal status: 07/25/23 progressing 130 degrees with pain  5.  Increase right shoulder ER to 60 degrees Goal status: met 07/06/23  6.  Return to water  aerobics and or gym activity Goal status:progressing met 06/01/23  PLAN:  PT FREQUENCY: 1-2x/week  PT DURATION: 12 weeks  PLANNED INTERVENTIONS: Therapeutic exercises, Therapeutic activity, Neuromuscular re-education, Balance training, Gait training, Patient/Family education, Self Care, Joint mobilization, Dry Needling, Electrical stimulation, Cryotherapy, Vasopneumatic device, and Manual  therapy  PLAN FOR NEXT SESSION:   will resume PT on the right shoulder conservatively to try to get back lost ROM and help with pain until she has US , She did get an appointment with sports med MD for next week to look at the left shoulder 09/05/2023, 7:55 AM [facility] [facility] [address] [facility] [address] [place], [place], [address] Phone: [phone]   Fax:  [phone]

## 2023-09-05 NOTE — Therapy (Signed)
 OUTPATIENT OCCUPATIONAL THERAPY ORTHO Treatment  Patient Name: Veronica Galvan MRN: [id number] DOB:July 30, 1955, 68 y.o., female Today's Date: 09/05/2023  PCP: Adra Alanis [name] REFERRING PROVIDER: Adra Alanis [name]  END OF SESSION:  OT End of Session - 09/05/23 1004     Visit Number 21    Number of Visits 41    Date for OT Re-Evaluation 11/07/23    Authorization Type [employer] M[employer]    Authorization Time Period 12 weeks    Authorization - Visit Number 21    Progress Note Due on Visit 26    OT Start Time 0845    OT Stop Time 0933    OT Time Calculation (min) 48 min    Activity Tolerance Patient tolerated treatment well    Behavior During Therapy WFL for tasks assessed/performed                      Past Medical History:  Diagnosis Date   Allergy     Anxiety    Arthritis    hands   Cataract    Diabetes mellitus without complication (HCC)    type 2   Family history of adverse reaction to anesthesia    son has malignant hyperthermia, daughter does not daughter recently had c section without problems   GERD (gastroesophageal reflux disease)    Headache    sinus   Hyperlipidemia    Hypertension    PONV (postoperative nausea and vomiting)    nausea only   Ulcer, stomach peptic yrs ago   Past Surgical History:  Procedure Laterality Date   APPENDECTOMY     both hells bone spur repair     both heels with metal clips   both shoulder rotator cuff repair     CESAREAN SECTION     x 1   COLONOSCOPY WITH PROPOFOL  N/A 09/07/2016   Procedure: COLONOSCOPY WITH PROPOFOL ;  Surgeon: Garrett Kallman, MD;  Location: [facility];  Service: Endoscopy;  Laterality: N/A;   colonscopy  06/2011   polyps   ELBOW FRACTURE SURGERY Left    EYE SURGERY     FRACTURE SURGERY     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/10/2022   Procedure: REVERSE SHOULDER ARTHROPLASTY;  Surgeon: Ellard Gunning, MD;  Location: [facility];  Service: Orthopedics;  Laterality: Right;   Please follow in room 6 if able   TUBAL LIGATION     VESICO-VAGINAL FISTULA REPAIR     Patient Active Problem List   Diagnosis Date Noted   Gastroesophageal reflux disease 11/16/2021   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 08/18/2021   Dermatofibroma 08/18/2021   History of malignant neoplasm of skin 08/18/2021   Lentigo 08/18/2021   Melanocytic nevi of trunk 08/18/2021   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   Rosacea 08/18/2021   Actinic keratosis 08/18/2021   Sensorineural hearing loss (SNHL) of left ear with unrestricted hearing of right ear 07/26/2021   Tinnitus of left ear 07/26/2021   Acute recurrent maxillary sinusitis 06/22/2021   Primary hypertension 01/12/2021   Hyperlipidemia associated with type 2 diabetes mellitus (HCC) 01/12/2021   Arthritis 01/12/2021   Type II diabetes mellitus (HCC) 11/25/2019   Ingrown toenail 09/05/2018   Neck pain 01/29/2018   Tick bite 10/24/2017    ONSET DATE: 05/19/23  REFERRING DIAG:  Diagnosis  M79.641,M79.642 (ICD-10-CM) - Bilateral hand pain    THERAPY DIAG:  Muscle weakness (generalized)  Pain in right hand  Pain in left hand  Other  lack of coordination  Stiffness of left hand, not elsewhere classified  Rationale for Evaluation and Treatment: Rehabilitation  SUBJECTIVE:   SUBJECTIVE STATEMENT: Pt reports she saw MD regarding her shoulder Pt accompanied by: self  PERTINENT HISTORY: 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 Yes: NPRS scale: 4/10- right hand, 4/10 left hand Pain location: right hand, left hand Pain description: aching, stiff Aggravating factors: gripping  Relieving factors: heat, meds Pt also has shoulder pain which is  more significant grossly 8/10 10 which OT will not address. Pt is on hold from PT pending further testing  FALLS: Has patient fallen in last 6 months? No  LIVING ENVIRONMENT: Lives with: lives alone Lives in: House/apartment Stairs: yes   PLOF: Independent  PATIENT GOALS: improve functional use of hands and learn adapted strategies for ADLs/IADLs.  NEXT MD VISIT: unknown  OBJECTIVE:  Note: Objective measures were completed at Evaluation unless otherwise noted.  HAND DOMINANCE: Right  ADLs: Overall ADLs: unable to grip credit card to remove from ATM Transfers/ambulation related to ADLs: mod I Eating: drops silverware Grooming: drops toothbrush, uses electric toothbrush, holding comb Upper body dressing: adjusting bra Lower body dressing: difficulty pulling up pants  Toileting: hygeine was difficult initally Bathing: mod I, difficult with shaving Tub shower transfers: walk in shower    FUNCTIONAL OUTCOME MEASURES: Quick Dash: 05/30/23- 75%% disability Quick Dash 08/15/23- 70.5% disability   UPPER EXTREMITY ROM:   RUE wrist flexion/ extension: 70/ 50, LUE wrist flexion/ extension: 70/ 45 Pt demonstrates grossly 50% composite flexion for right and 555 with left.   Active ROM Right eval Left eval  Thumb MCP (0-60) 45 55  Thumb IP (0-80) 25 10  Thumb Radial abd/add (0-55)     Thumb Palmar abd/add (0-45)     Thumb Opposition to Small Finger yes yes   Index MCP (0-90)     Index PIP (0-100)     Index DIP (0-70)      Long MCP (0-90)      Long PIP (0-100)      Long DIP (0-70)      Ring MCP (0-90)      Ring PIP (0-100)      Ring DIP (0-70)      Little MCP (0-90)      Little PIP (0-100)      Little DIP (0-70)      (Blank rows = not tested) 9 hole peg test (08/15/23) RUE 26.70 secs, LUE 23 secs  HAND FUNCTION: Grip strength: Right: 8 lbs; Left: 4 lbs,          08/15/23-  RUE 18, LUE 14 lbs,           08/15/23   Pinch: RUE tip 4lbs, lateral 8 lbs, LUE tip 3 lbs,  lateral 8 lbs   SENSATION: Light touch: Impaired index finger LUE  EDEMA: Pt with bony defomities at PIP joints of all digits which pt reports are bone spurs, Pt also has bony deformity at DIP for right thumb, index and 5th digit and LUE small and index fingers  COGNITION: Overall cognitive status: Within functional limits for tasks assessed   OBSERVATIONS: Pleasant female desires to increase functional use of bilateral UE's   TREATMENT DATE: 09/05/23-Paraffin to RUE x 10 mins,  no adverse reactions, while therapist performed US  to LUE 3.3 mhz, 0.8 w/cm 2,  20%x 8 mins no adverse reactions for stiffness. Paraffin to LUE x 10 mins, no adverse reactions, while therapist performed US  to RUE 3.3 mhz, 0.8 w/cm 2,  20%x 8 mins no adverse reactions for stiffness.  P/ROM to RUE PIP joints individually then compositely in flexion followed by passive stretch to digits of LUE individually in composite flexion then all digits together for passive composite finger flexion. A/ROM individual finger extension for bilateral UE's, then digiflex light resistance for individual and composite flexion. Pt transitioned to composite flexion with resistaive squeeze ball due to discomfort with digit flex for RUE.  08/29/23-Paraffin to RUE x 10 mins, no adverse reactions, while therapist performed US  to LUE 3.3 mhz, 0.8 w/cm 2,  20%x 8 mins no adverse reactions for stiffness. Paraffin to LUE x 10 mins, no adverse reactions, while therapist performed US  to RUE 3.3 mhz, 0.8 w/cm 2,  20%x 8 mins no adverse reactions for stiffness.  P/ROM to RUE PIP joints individually then compositely in flexion followed by passive stretch to digits of LUE individually in composite flexion then all digits together for passive composite finger flexion. Pt with impoved bilateral flexibility end of session and decreased pain. Pt sees orthopedicst today regarding her shoulders.  08/24/23--Paraffin to RUE x 10 mins, no adverse reactions, while  therapist performed US  to LUE 3.3 mhz, 0.8 w/cm 2,  20%x 8 mins no adverse reactions for stiffness. Paraffin to LUE x 10 mins, no adverse reactions, while therapist performed US  to RUE 3.3 mhz, 0.8 w/cm 2,  20%x 8 mins no adverse reactions for stiffness.  Pt reports she ordered and received a rocker knife. she reports she was able to use the rocker knife to cut corned beef last night with sucess. Composite finger P/ROM to bilateral hands, followed by review of yellow putty exercises for composite flexion and tip pinch for bilateral UE's, min v.c. Pt was encouraged to perfrom slowly and to stop if she has significnatly increased pain.  08/21/23-Paraffin to RUE x 10 mins, no adverse reactions, while therapist performed US  to LUE 3.3 mhz, 0.8 w/cm 2,  20%x 8 mins no adverse reactions for stiffness. Paraffin to LUE x 10 mins, no adverse reactions, while therapist performed US  to RUE 3.3 mhz, 0.8 w/cm 2,  20%x 8 mins no adverse reactions for stiffness.  Composite finger passive P/ROM to bilateral hands, as well as PIP blocking for index and ring fingers, a/RPM individual finger extension with right and left UE's Digi flex light resistance for individual resisted finger flexion, then composite finger flexion for bilateral UE's  08/17/23 Paraffin to RUE x 10 mins, no adverse reactions, while therapist performed US  to LUE 3.3 mhz, 0.8 w/cm 2,  20%x 8 mins no adverse reactions for stiffness. Paraffin to LUE x 10 mins, no adverse reactions, while therapist performed US  to RUE 3.3 mhz, 0.8 w/cm 2,  20%x 8 mins no adverse reactions for stiffness.  Education regarding strategies fo cutting food, with pt simulating using knife and fork with max difficulty. Pt was shown rocker knife and she as able to use sucessfully following instruction. Pt was provided with information for purchase. Gentle composite finger passive ROM to bilateral hands.    4/15/25Paraffin to RUE x 10 mins, no adverse reactions, while therapist  performed US  to LUE 3.3 mhz, 0.8 w/cm 2,  20%x 8 mins no adverse reactions for stiffness. Paraffin to LUE x 10 mins,  no adverse reactions, while therapist performed US  to RUE 3.3 mhz, 0.8 w/cm 2,  20%x 8 mins no adverse reactions for stiffness.Gentle PIP blocking and passive ROM individual and composite finger flexion followed by finger  thumb flexion bilateral UE's. Therapist checked progress towards goals and took updated measurements then discussed plans for renewal and updated goals. See goals   08/08/23-Paraffin to RUE x 10 mins, no adverse reactions, while therapist performed US  to LUE 3.3 mhz, 0.8 w/cm 2,  20%x 8 mins no adverse reactions for stiffness. Therapist discussed possible modifcations for eating with pt such as holding fork or spoon between digits, raising height of what she is eating by placing on a book, supporting right elbow with a pillow. Paraffin to LUE x 10 mins, no adverse reactions, while therapist performed US  to RUE 3.3 mhz, 0.8 w/cm 2,  20%x 8 mins no adverse reactions for stiffness.Gentle PIP blocking and passive ROM individual and composite finger flexion followed by finger  thumb flexion bilateral UE's. Pt reports hands are less painful at end of session.  08/03/23-Paraffin to RUE x 10 mins, no adverse reactions, while therapist performed US  to LUE 3.3 mhz, 0.8 w/cm 2,  20%x 8 mins no adverse reactions for stiffness. Paraffin to LUE x 10 mins, no adverse reactions, while therapist performed US  to RUE 3.3 mhz, 0.8 w/cm 2,  20%x 8 mins no adverse reactions for stiffness. Gentle PIP blocking and passive ROM composite finger flexion followed by finger thumb opposition and thumb flexion bilateral UE's. Pt with decreased pain and improved flexibility after US  today.   08/01/23 Paraffin to left hand x 10 mins while pt performed A/ROM  for RUE: blocking exercises followed by P/ROM composite flexion to digits individually and compositely, A/ROM thumb flexion/ extension and finger thumb  opposition. Paraffin to RUE x 10 mins  while LUE A/ROM blocking exercises followed by P/ROM composite flexion to digits individually and compositely, finger thumb opposition and thumb flexion. Yelllow putty exercises for sustained grip and pinch for bilateral UE's min v.c individual tip pinch for digits with exception of middle digit right hand. Pt rpeorts using her food processor to cut vegetables for soup. Pt to bring in her pick next visit for adaptation.                                                                               PATIENT EDUCATION: Education details: see above Person educated: Patient Education method: Explanation, demonstration, v.c Education comprehension: verbalized understanding, returned demonstration  HOME EXERCISE PROGRAM: AROM yellow putty  GOALS: Goals reviewed with patient? Yes  SHORT TERM GOALS: Target date: 09/14/23  I with HEP  Goal status:  met 06/07/23  2.  I with positioning/ splinting prn to minimize pain and defomity   Goal status: met, 07/13/23  3.  Pt will increase bilateral grip strength by 5 lbs for increased UE functional use. Upgraded goal Pt will increase bilateral grip strength to at least 25 lbs (after stretching). Baseline: 05/30/23- RUE 8 lbs, LUE 4 lbs Goal status:met 08/01/23-R 20 lbs, L  19 lbs,  08/15/23- RUE 18 lbsLUE 14 lbs continue to address upgraded goal.  4. Pt will demonstrate improved RUE fine motor  coordiantion as evidenced by perfroming 9 hole peg test in 23 secs or less.  Goal status: new 5. I with updates to HEP.  Goal status: new 6. Pt will demonstrate improved composite finger flexion for ADLs as evidenced by pt. bringing middle fingertip for RUE within 1/2 an inch of palm.  Baseline: 3/4 inch from palm to middle fingertip  Goal status : new 7.Pt will demonstrate improved composite finger flexion for ADLs as evidenced by pt. bringing middle fingertip for LUE within 3/4 an inch of palm.  Baseline: 1 inch from  palm to middle finger tip  Goal status: new  LONG TERM GOALS: Target date:11/07/23  I with updated HEP  Goal status:met 08/15/22  2.  Pt will improve Quick Dash score to 70% disability Baseline: 75% (05/30/23) Goal status:   improved - however not fully met due to complications from shoulder pain-70.5%08/15/23  3.  Pt will demonstrate at least 65% composte flexion for LUE for increased functional use.  Goal status: met 65-70% , 08/15/23  4.  I with adapted strategies/ adapted equipment to minimize pain and to increase pt I with ADLs/IADLs  Goal status:  met for inital strategies, however continue goal as pt may report additional tasks that can benefit from modification.08/15/23  5.  Pt will demonstrate at least 65% composite flexion for RUE for increased functional use.  Goal status: met 70% 08/15/23  6. Pt will report that she is able to cut her meat such as steak or chicken modified independently.  [place]: unable  Goal status: new    7. Pt will report increased ease with opening/ closing ziplock bags.    Goal status: new    8. Pt will improve bilateral tip pinch by 2 lbs for increased ease with daily activities.     Goal status: new    9. Pt will improve bilateral lateral pinch by 2 lbs for increased ease with ADLs.    Goal status: new    ASSESSMENT: CLINICAL IMPRESSION: Pt is progressing towards goals. She demonstrates improved bilateral UE flexibility and pt reports her pain is much better every morning when she wakes up.PERFORMANCE DEFICITS: in functional skills including ADLs, IADLs, coordination, sensation, edema, ROM, strength, pain, flexibility, Fine motor control, Gross motor control, endurance, decreased knowledge of precautions, decreased knowledge of use of DME, and UE functional use,, and psychosocial skills including coping strategies, environmental adaptation, habits, interpersonal interactions, and routines and behaviors.   IMPAIRMENTS: are limiting patient from  ADLs, IADLs, rest and sleep, education, play, leisure, and social participation.   COMORBIDITIES: may have co-morbidities  that affects occupational performance. Patient will benefit from skilled OT to address above impairments and improve overall function.  MODIFICATION OR ASSISTANCE TO COMPLETE EVALUATION: No modification of tasks or assist necessary to complete an evaluation.  OT OCCUPATIONAL PROFILE AND HISTORY: Detailed assessment: Review of records and additional review of physical, cognitive, psychosocial history related to current functional performance.  CLINICAL DECISION MAKING: LOW - limited treatment options, no task modification necessary  REHAB POTENTIAL: Good  EVALUATION COMPLEXITY: Low      PLAN:  OT FREQUENCY: 2x/week  OT DURATION: 12 weeks( anticipate d/c after 8 weeks dependent on progress)  PLANNED INTERVENTIONS: 97168 OT Re-evaluation, 97535 self care/ADL training, 62130 therapeutic exercise, 97530 therapeutic activity, 97112 neuromuscular re-education, 97140 manual therapy, 97113 aquatic therapy, 97035 ultrasound, 97018 paraffin, 86578 fluidotherapy, 97010 moist heat, 97010 cryotherapy, 97034 contrast bath, 97760 Orthotics management and training, 46962 Splinting (initial encounter), [id number] Subsequent splinting/medication, passive  range of motion, energy conservation, coping strategies training, patient/family education, and DME and/or AE instructions  RECOMMENDED OTHER SERVICES: none  CONSULTED AND AGREED WITH PLAN OF CARE: Patient  PLAN FOR NEXT SESSION:  work towards short and long term goals    Eymi Lipuma, OT 09/05/2023, 10:12 AM

## 2023-09-05 NOTE — Telephone Encounter (Signed)
  Follow up Call-     09/04/2023    8:47 AM  Call back number  Post procedure Call Back phone  # [phone]  Permission to leave phone message Yes     Patient questions:  Do you have a fever, pain , or abdominal swelling? No. Pain Score  0 *  Have you tolerated food without any problems? Yes.    Have you been able to return to your normal activities? Yes.    Do you have any questions about your discharge instructions: Diet   No. Medications  No. Follow up visit  No.  Do you have questions or concerns about your Care? No.  Actions: * If pain score is 4 or above: No action needed, pain <4.

## 2023-09-06 ENCOUNTER — Encounter: Payer: Self-pay | Admitting: Family Medicine

## 2023-09-06 ENCOUNTER — Ambulatory Visit (INDEPENDENT_AMBULATORY_CARE_PROVIDER_SITE_OTHER)

## 2023-09-06 ENCOUNTER — Ambulatory Visit: Admitting: Family Medicine

## 2023-09-06 ENCOUNTER — Encounter (HOSPITAL_COMMUNITY): Payer: Self-pay

## 2023-09-06 ENCOUNTER — Other Ambulatory Visit: Payer: Self-pay

## 2023-09-06 VITALS — BP 122/84 | HR 83 | Ht 61.0 in | Wt 199.0 lb

## 2023-09-06 DIAGNOSIS — G8929 Other chronic pain: Secondary | ICD-10-CM

## 2023-09-06 DIAGNOSIS — M25511 Pain in right shoulder: Secondary | ICD-10-CM | POA: Diagnosis not present

## 2023-09-06 DIAGNOSIS — M25512 Pain in left shoulder: Secondary | ICD-10-CM

## 2023-09-06 LAB — SURGICAL PATHOLOGY

## 2023-09-06 NOTE — Patient Instructions (Addendum)
 Thank you for coming in today.   I've referred you to Physical Therapy.  Let us  know if you don't hear from them in one week.   You received an injection today. Seek immediate medical attention if the joint becomes red, extremely painful, or is oozing fluid.   Please get an Xray today before you leave   I will send Dr. Alfredo Ano a letter  Check back as needed

## 2023-09-06 NOTE — Progress Notes (Signed)
 I, Miquel Amen, CMA acting as a scribe for Garlan Juniper, MD.  Veronica Galvan is a 68 y.o. female who presents to [facility] at [facility] today for L shoulder pain x 4 months, overuse injury. Working with Dr. Alfredo Ano on right shoulder s/p fall and surgery. Pt locates pain to the left upper arm. Pt is RHD. Sharp shooting pain, worsening since onset. Denies n/t. Notes decreased grip strength, related to and OA. Having weakness in the arm and decreased ROM, worsening over time. Pt also c/o chronic neck pain, going for massage once monthly.   Additionally she notes her right shoulder initially was doing great after surgery but over the last 6 months she progressively has had worsening discomfort and pain and lack of range of motion.  She is attending physical therapy and recently had a three-phase bone scan that did not show clear evidence of loosening or infection.  Radiates: upper arm Aggravates: ROM Treatments tried: OT, ice  Pertinent review of systems: No fevers or chills  Relevant historical information: Diabetes.  Hypertension. Right proximal humerus fracture necessitating right reverse total shoulder replacement Dr. Alfredo Ano 2024.  Exam:  BP 122/84   Pulse 83   Ht 5\' 1"  (1.549 m)   Wt 199 lb (90.3 kg)   SpO2 98%   BMI 37.60 kg/m  General: Well Developed, well nourished, and in no acute distress.   MSK: Left shoulder: Mature scar superior shoulder from what looks to be an open rotator cuff repair. Range of motion intact functional internal rotation and external rotation.  Limited abduction to about 100 degrees. Strength is intact.  Pain with resisted abduction. Positive Hawkins and Neer's test.  Right shoulder: Mature scar present.  Decreased range of motion limited external rotation internal rotation and abduction.  Intact within limits of motion.    Lab and Radiology Results  Procedure: Real-time Ultrasound Guided Injection of the shoulder subacromial  bursa Device: Philips Affiniti 50G/GE Logiq Images permanently stored and available for review in PACS Ultrasound evaluation shows intact appearing rotator cuff tendons with moderate subacromial bursitis. Verbal informed consent obtained.  Discussed risks and benefits of procedure. Warned about infection, bleeding, hyperglycemia damage to structures among others. Patient expresses understanding and agreement Time-out conducted.   Noted no overlying erythema, induration, or other signs of local infection.  Skin prepped in a sterile fashion.   Local anesthesia: Topical Ethyl chloride.   With sterile technique and under real time ultrasound guidance: 40 mg of Kenalog  and 2 mL of Marcaine  injected into subacromial bursa. Fluid seen entering the bursa.   Completed without difficulty   Pain immediately resolved suggesting accurate placement of the medication.   Advised to call if fevers/chills, erythema, induration, drainage, or persistent bleeding.   Images permanently stored and available for review in the ultrasound unit.  Impression: Technically successful ultrasound guided injection.   Ultrasound evaluation contralateral right shoulder. Ultrasound diagnostic accuracy limited by limited positioning. I was unable to visualize biceps tendon suspect tenodesis or tear. Subscapularis tendon is partially visible and difficult to visualize.  This is mostly due to limited positioning but could reflect a tear. Supraspinatus tendon is thin but does appear to be intact.  Hypoechoic swelling present superficial to tendon representing bursitis or sequelae of surgery. Infraspinatus tendon does appear to be intact.   X-ray images left shoulder obtained today personally and independently interpreted. Calcific change present distal portion of humeral head at supraspinatus insertion site.  Evidence of prior subacromial decompression and distal  clavicle excision.  No severe glenohumeral DJD.  Loose body  present within the shoulder joint visible. Await formal radiology review  Assessment and Plan: 68 y.o. female with chronic left shoulder pain.  This occurs in the setting of a right shoulder fracture requiring a reverse total shoulder replacement around July of last year.  She has been using her left shoulder much more and is having worsening pain.  She did have immediate benefit from subacromial injection indicating impingement and bursitis.  Plan at formalized physical therapy for the left shoulder as well which could be quite helpful.  As for the right shoulder understanding is Dr. Alfredo Ano is planning on arranging for a diagnostic ultrasound of the rotator cuff tendons of her right shoulder.  This should be done by Dr. Jacqulyne Maxim next month.  I took a look myself and had a really hard time seeing the structures normally.  This could indicate pathology or just inadequate positioning.  In the absence of her surgery her symptoms would be consistent with frozen shoulder.  I am not sure if this is something that it is likely to happen after reverse total shoulder replacement.  She has been working with physical therapy.  I have sent a letter to Dr. Jacqulyne Maxim talking about my results and wondering about adhesive capsulitis.   PDMP not reviewed this encounter. Orders Placed This Encounter  Procedures   US  LIMITED JOINT SPACE STRUCTURES UP LEFT(NO LINKED CHARGES)    Reason for Exam (SYMPTOM  OR DIAGNOSIS REQUIRED):   left shoulder pain    Preferred imaging location?:   [facility] Shoulder Left    Standing Status:   Future    Number of Occurrences:   1    Expiration Date:   10/07/2023    Reason for Exam (SYMPTOM  OR DIAGNOSIS REQUIRED):   left shoulder pain    Preferred imaging location?:   [facility]   Ambulatory referral to Physical Therapy    Referral Priority:   Routine    Referral Type:   Physical Medicine    Referral Reason:   Specialty Services Required     Requested Specialty:   Physical Therapy    Number of Visits Requested:   1   No orders of the defined types were placed in this encounter.    Discussed warning signs or symptoms. Please see discharge instructions. Patient expresses understanding.   The above documentation has been reviewed and is accurate and complete Garlan Juniper, M.D.

## 2023-09-07 ENCOUNTER — Other Ambulatory Visit: Payer: Self-pay | Admitting: Family

## 2023-09-07 ENCOUNTER — Ambulatory Visit: Admitting: Occupational Therapy

## 2023-09-07 ENCOUNTER — Encounter: Payer: Self-pay | Admitting: Pediatrics

## 2023-09-07 ENCOUNTER — Ambulatory Visit: Admitting: Physical Therapy

## 2023-09-07 ENCOUNTER — Encounter: Payer: Self-pay | Admitting: Physical Therapy

## 2023-09-07 ENCOUNTER — Encounter: Payer: Self-pay | Admitting: Occupational Therapy

## 2023-09-07 DIAGNOSIS — M25641 Stiffness of right hand, not elsewhere classified: Secondary | ICD-10-CM

## 2023-09-07 DIAGNOSIS — M79642 Pain in left hand: Secondary | ICD-10-CM | POA: Diagnosis not present

## 2023-09-07 DIAGNOSIS — M6281 Muscle weakness (generalized): Secondary | ICD-10-CM

## 2023-09-07 DIAGNOSIS — R252 Cramp and spasm: Secondary | ICD-10-CM | POA: Diagnosis not present

## 2023-09-07 DIAGNOSIS — M25611 Stiffness of right shoulder, not elsewhere classified: Secondary | ICD-10-CM | POA: Diagnosis not present

## 2023-09-07 DIAGNOSIS — R600 Localized edema: Secondary | ICD-10-CM

## 2023-09-07 DIAGNOSIS — M79641 Pain in right hand: Secondary | ICD-10-CM | POA: Diagnosis not present

## 2023-09-07 DIAGNOSIS — M25511 Pain in right shoulder: Secondary | ICD-10-CM | POA: Diagnosis not present

## 2023-09-07 DIAGNOSIS — M542 Cervicalgia: Secondary | ICD-10-CM

## 2023-09-07 DIAGNOSIS — M25642 Stiffness of left hand, not elsewhere classified: Secondary | ICD-10-CM

## 2023-09-07 DIAGNOSIS — R278 Other lack of coordination: Secondary | ICD-10-CM

## 2023-09-07 DIAGNOSIS — G8929 Other chronic pain: Secondary | ICD-10-CM | POA: Diagnosis not present

## 2023-09-07 MED ORDER — SEMAGLUTIDE (2 MG/DOSE) 8 MG/3ML ~~LOC~~ SOPN: 2.0000 mg | PEN_INJECTOR | SUBCUTANEOUS | 3 refills | Status: DC

## 2023-09-07 NOTE — Therapy (Signed)
 OUTPATIENT OCCUPATIONAL THERAPY ORTHO Treatment  Patient Name: Veronica Galvan MRN: [id number] DOB:December 04, 1955, 68 y.o., female Today's Date: 09/07/2023  PCP: Adra Alanis [name] REFERRING PROVIDER: Adra Alanis [name]  END OF SESSION:  OT End of Session - 09/07/23 0821     Visit Number 22    Number of Visits 41    Date for OT Re-Evaluation 11/07/23    Authorization Type [employer] MCR    Authorization Time Period 12 weeks    Authorization - Visit Number 22    Progress Note Due on Visit 26    OT Start Time 0845    OT Stop Time 0928    OT Time Calculation (min) 43 min                      Past Medical History:  Diagnosis Date   Allergy     Anxiety    Arthritis    hands   Cataract    Diabetes mellitus without complication (HCC)    type 2   Family history of adverse reaction to anesthesia    son has malignant hyperthermia, daughter does not daughter recently had c section without problems   GERD (gastroesophageal reflux disease)    Headache    sinus   Hyperlipidemia    Hypertension    PONV (postoperative nausea and vomiting)    nausea only   Ulcer, stomach peptic yrs ago   Past Surgical History:  Procedure Laterality Date   APPENDECTOMY     both hells bone spur repair     both heels with metal clips   both shoulder rotator cuff repair     CESAREAN SECTION     x 1   COLONOSCOPY WITH PROPOFOL  N/A 09/07/2016   Procedure: COLONOSCOPY WITH PROPOFOL ;  Surgeon: Garrett Kallman, MD;  Location: [facility];  Service: Endoscopy;  Laterality: N/A;   colonscopy  06/2011   polyps   ELBOW FRACTURE SURGERY Left    EYE SURGERY     FRACTURE SURGERY     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/10/2022   Procedure: REVERSE SHOULDER ARTHROPLASTY;  Surgeon: Ellard Gunning, MD;  Location: [facility];  Service: Orthopedics;  Laterality: Right;  Please follow in room 6 if able   TUBAL LIGATION     VESICO-VAGINAL FISTULA REPAIR     Patient Active Problem List    Diagnosis Date Noted   Gastroesophageal reflux disease 11/16/2021   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 08/18/2021   Dermatofibroma 08/18/2021   History of malignant neoplasm of skin 08/18/2021   Lentigo 08/18/2021   Melanocytic nevi of trunk 08/18/2021   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   Rosacea 08/18/2021   Actinic keratosis 08/18/2021   Sensorineural hearing loss (SNHL) of left ear with unrestricted hearing of right ear 07/26/2021   Tinnitus of left ear 07/26/2021   Acute recurrent maxillary sinusitis 06/22/2021   Primary hypertension 01/12/2021   Hyperlipidemia associated with type 2 diabetes mellitus (HCC) 01/12/2021   Arthritis 01/12/2021   Type II diabetes mellitus (HCC) 11/25/2019   Ingrown toenail 09/05/2018   Neck pain 01/29/2018   Tick bite 10/24/2017    ONSET DATE: 05/19/23  REFERRING DIAG:  Diagnosis  M79.641,M79.642 (ICD-10-CM) - Bilateral hand pain    THERAPY DIAG:  Muscle weakness (generalized)  Pain in right hand  Pain in left hand  Other lack of coordination  Stiffness of left hand, not elsewhere classified  Stiffness of right hand, not elsewhere classified  Rationale for Evaluation and Treatment: Rehabilitation  SUBJECTIVE:   SUBJECTIVE STATEMENT: Pt reports hands are less painful Pt accompanied by: self  PERTINENT HISTORY: 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 Yes: NPRS scale: 3/10 Pain location: right hand, left hand Pain description: aching, stiff Aggravating factors: gripping  Relieving factors: heat, meds Pt also has shoulder pain which is more significant grossly 8/10 10 which OT will not address. Pt is on hold from PT pending further testing  FALLS:  Has patient fallen in last 6 months? No  LIVING ENVIRONMENT: Lives with: lives alone Lives in: House/apartment Stairs: yes   PLOF: Independent  PATIENT GOALS: improve functional use of hands and learn adapted strategies for ADLs/IADLs.  NEXT MD VISIT: unknown  OBJECTIVE:  Note: Objective measures were completed at Evaluation unless otherwise noted.  HAND DOMINANCE: Right  ADLs: Overall ADLs: unable to grip credit card to remove from ATM Transfers/ambulation related to ADLs: mod I Eating: drops silverware Grooming: drops toothbrush, uses electric toothbrush, holding comb Upper body dressing: adjusting bra Lower body dressing: difficulty pulling up pants  Toileting: hygeine was difficult initally Bathing: mod I, difficult with shaving Tub shower transfers: walk in shower    FUNCTIONAL OUTCOME MEASURES: Quick Dash: 05/30/23- 75%% disability Quick Dash 08/15/23- 70.5% disability   UPPER EXTREMITY ROM:   RUE wrist flexion/ extension: 70/ 50, LUE wrist flexion/ extension: 70/ 45 Pt demonstrates grossly 50% composite flexion for right and 555 with left.   Active ROM Right eval Left eval  Thumb MCP (0-60) 45 55  Thumb IP (0-80) 25 10  Thumb Radial abd/add (0-55)     Thumb Palmar abd/add (0-45)     Thumb Opposition to Small Finger yes yes   Index MCP (0-90)     Index PIP (0-100)     Index DIP (0-70)      Long MCP (0-90)      Long PIP (0-100)      Long DIP (0-70)      Ring MCP (0-90)      Ring PIP (0-100)      Ring DIP (0-70)      Little MCP (0-90)      Little PIP (0-100)      Little DIP (0-70)      (Blank rows = not tested) 9 hole peg test (08/15/23) RUE 26.70 secs, LUE 23 secs  HAND FUNCTION: Grip strength: Right: 8 lbs; Left: 4 lbs,          08/15/23-  RUE 18, LUE 14 lbs,           08/15/23   Pinch: RUE tip 4lbs, lateral 8 lbs, LUE tip 3 lbs, lateral 8 lbs   SENSATION: Light touch: Impaired index finger LUE  EDEMA: Pt with bony defomities at PIP joints of  all digits which pt reports are bone spurs, Pt also has bony deformity at DIP for right thumb, index and 5th digit and LUE small and index fingers  COGNITION: Overall cognitive status: Within functional limits for tasks assessed   OBSERVATIONS: Pleasant female desires to increase functional use of bilateral UE's  , TREATMENT DATE:09/07/23-Paraffin to RUE x 10 mins, no adverse reactions, while therapist performed US  to LUE 3.3 mhz, 0.8 w/cm 2,  20%x 8 mins no adverse  reactions for stiffness. Paraffin to LUE x 10 mins, no adverse reactions, while therapist performed US  to RUE 3.3 mhz, 0.8 w/cm 2,  20%x 8 mins no adverse reactions for stiffness.  P/ROM to RUE PIP joints individually then compositely in flexion followed by passive stretch to digits of LUE individually in composite flexion then all digits together for passive composite finger flexion. A/ROM individual finger extension for bilateral UE's Washcloth scrunches and flattening with bilateral UE's min v.c  composite and individual finger flexion with resistive squeeze ball(tree) 10-20 reps each bilateral UE's   09/05/23-Paraffin to RUE x 10 mins, no adverse reactions, while therapist performed US  to LUE 3.3 mhz, 0.8 w/cm 2,  20%x 8 mins no adverse reactions for stiffness. Paraffin to LUE x 10 mins, no adverse reactions, while therapist performed US  to RUE 3.3 mhz, 0.8 w/cm 2,  20%x 8 mins no adverse reactions for stiffness.  P/ROM to RUE PIP joints individually then compositely in flexion followed by passive stretch to digits of LUE individually in composite flexion then all digits together for passive composite finger flexion. A/ROM individual finger extension for bilateral UE's, then digiflex light resistance for individual and composite flexion. Pt transitioned to composite flexion with resistive squeeze ball due to discomfort with digit flex for RUE.   PATIENT EDUCATION: Education details:  see above Person educated: Patient Education  method: Explanation, demonstration, v.c Education comprehension: verbalized understanding, returned demonstration  HOME EXERCISE PROGRAM: AROM red putty  GOALS: Goals reviewed with patient? Yes  SHORT TERM GOALS: Target date: 09/14/23  I with HEP  Goal status:  met 06/07/23  2.  I with positioning/ splinting prn to minimize pain and defomity   Goal status: met, 07/13/23  3.  Pt will increase bilateral grip strength by 5 lbs for increased UE functional use. Upgraded goal Pt will increase bilateral grip strength to at least 25 lbs (after stretching). Baseline: 05/30/23- RUE 8 lbs, LUE 4 lbs Goal status:met 08/01/23-R 20 lbs, L  19 lbs,  08/15/23- RUE 18 lbsLUE 14 lbs continue to address upgraded goal.  4. Pt will demonstrate improved RUE fine motor coordiantion as evidenced by perfroming 9 hole peg test in 23 secs or less.  Goal status: new 5. I with updates to HEP.  Goal status: new 6. Pt will demonstrate improved composite finger flexion for ADLs as evidenced by pt. bringing middle fingertip for RUE within 1/2 an inch of palm.  Baseline: 3/4 inch from palm to middle fingertip  Goal status : new 7.Pt will demonstrate improved composite finger flexion for ADLs as evidenced by pt. bringing middle fingertip for LUE within 3/4 an inch of palm.  Baseline: 1 inch from palm to middle finger tip  Goal status: new  LONG TERM GOALS: Target date:11/07/23  I with updated HEP  Goal status:met 08/15/22  2.  Pt will improve Quick Dash score to 70% disability Baseline: 75% (05/30/23) Goal status:   improved - however not fully met due to complications from shoulder pain-70.5%08/15/23  3.  Pt will demonstrate at least 65% composte flexion for LUE for increased functional use.  Goal status: met 65-70% , 08/15/23  4.  I with adapted strategies/ adapted equipment to minimize pain and to increase pt I with ADLs/IADLs  Goal status:  met for inital strategies, however continue goal as pt may report  additional tasks that can benefit from modification.08/15/23  5.  Pt will demonstrate at least 65% composite flexion for RUE for increased functional use.  Goal  status: met 70% 08/15/23  6. Pt will report that she is able to cut her meat such as steak or chicken modified independently.  [place]: unable  Goal status: new    7. Pt will report increased ease with opening/ closing ziplock bags.    Goal status: new    8. Pt will improve bilateral tip pinch by 2 lbs for increased ease with daily activities.     Goal status: new    9. Pt will improve bilateral lateral pinch by 2 lbs for increased ease with ADLs.    Goal status: new    ASSESSMENT: CLINICAL IMPRESSION: Pt is progressing towards goals. Her pain and flexibility have improved in her hands.Aaron AasPERFORMANCE DEFICITS: in functional skills including ADLs, IADLs, coordination, sensation, edema, ROM, strength, pain, flexibility, Fine motor control, Gross motor control, endurance, decreased knowledge of precautions, decreased knowledge of use of DME, and UE functional use,, and psychosocial skills including coping strategies, environmental adaptation, habits, interpersonal interactions, and routines and behaviors.   IMPAIRMENTS: are limiting patient from ADLs, IADLs, rest and sleep, education, play, leisure, and social participation.   COMORBIDITIES: may have co-morbidities  that affects occupational performance. Patient will benefit from skilled OT to address above impairments and improve overall function.  MODIFICATION OR ASSISTANCE TO COMPLETE EVALUATION: No modification of tasks or assist necessary to complete an evaluation.  OT OCCUPATIONAL PROFILE AND HISTORY: Detailed assessment: Review of records and additional review of physical, cognitive, psychosocial history related to current functional performance.  CLINICAL DECISION MAKING: LOW - limited treatment options, no task modification necessary  REHAB POTENTIAL: Good  EVALUATION  COMPLEXITY: Low      PLAN:  OT FREQUENCY: 2x/week  OT DURATION: 12 weeks( anticipate d/c after 8 weeks dependent on progress)  PLANNED INTERVENTIONS: 14782 OT Re-evaluation, 97535 self care/ADL training, 95621 therapeutic exercise, 97530 therapeutic activity, 97112 neuromuscular re-education, 97140 manual therapy, 97113 aquatic therapy, 97035 ultrasound, 97018 paraffin, 30865 fluidotherapy, 97010 moist heat, 97010 cryotherapy, 97034 contrast bath, 97760 Orthotics management and training, 78469 Splinting (initial encounter), [id number] Subsequent splinting/medication, passive range of motion, energy conservation, coping strategies training, patient/family education, and DME and/or AE instructions  RECOMMENDED OTHER SERVICES: none  CONSULTED AND AGREED WITH PLAN OF CARE: Patient  PLAN FOR NEXT SESSION:  ROM, work towards short and long term goals    Islah Eve, OT 09/07/2023, 8:24 AM

## 2023-09-07 NOTE — Telephone Encounter (Unsigned)
 Copied from CRM [id number]. Topic: Clinical - Medication Refill >> Sep 07, 2023 10:02 AM [name] wrote: Medication: Semaglutide ,0.25 or 0.5MG /DOS, (OZEMPIC , 0.25 OR 0.5 MG/DOSE,) 2 MG/3ML SOPN  Has the patient contacted their pharmacy? Yes (Agent: If no, request that the patient contact the pharmacy for the refill. If patient does not wish to contact the pharmacy document the reason why and proceed with request.) (Agent: If yes, when and what did the pharmacy advise?)  This is the patient's preferred pharmacy:  [facility], [address] [place], [place], [place] [address] ph: [phone] fax: [phone]   Is this the correct pharmacy for this prescription? Yes If no, delete pharmacy and type the correct one.   Has the prescription been filled recently? Yes  Is the patient out of the medication? Yes  Has the patient been seen for an appointment in the last year OR does the patient have an upcoming appointment? Yes  Can we respond through MyChart? Yes  Agent: Please be advised that Rx refills may take up to 3 business days. We ask that you follow-up with your pharmacy.

## 2023-09-07 NOTE — Telephone Encounter (Signed)
    Veronica Galvan TR   09/07/23 10:06 AM Unsigned Note Copied from CRM [id number]. Topic: Clinical - Medication Refill >> Sep 07, 2023 10:02 AM [name] wrote: Medication: Semaglutide ,0.25 or 0.5MG /DOS, (OZEMPIC , 0.25 OR 0.5 MG/DOSE,) 2 MG/3ML SOPN   Has the patient contacted their pharmacy? Yes (Agent: If no, request that the patient contact the pharmacy for the refill. If patient does not wish to contact the pharmacy document the reason why and proceed with request.) (Agent: If yes, when and what did the pharmacy advise?)   This is the patient's preferred pharmacy:  [facility], [address] [place], [place], [place] [address] ph: [phone] fax: [phone]    Is this the correct pharmacy for this prescription? Yes If no, delete pharmacy and type the correct one.    Has the prescription been filled recently? Yes   Is the patient out of the medication? Yes   Has the patient been seen for an appointment in the last year OR does the patient have an upcoming appointment? Yes   Can we respond through MyChart? Yes   Agent: Please be advised that Rx refills may take up to 3 business days. We ask that you follow-up with your pharmacy.       Veronica Galvan TR    09/07/23 10:06 AM Quanity of 9 ml for 84 day supply

## 2023-09-07 NOTE — Therapy (Signed)
 OUTPATIENT PHYSICAL THERAPY SHOULDER    Patient Name: Veronica Galvan MRN: [id number] DOB:[birth year], 68 y.o., female Today's Date: 09/07/2023  END OF SESSION:  PT End of Session - 09/07/23 0756     Visit Number 65    Date for PT Re-Evaluation 10/01/23    Authorization Type [employer] [place]    PT Start Time [phone]    PT Stop Time 0857    PT Time Calculation (min) 62 min    Activity Tolerance Patient tolerated treatment well    Behavior During Therapy WFL for tasks assessed/performed             Past Medical History:  Diagnosis Date   Allergy     Anxiety    Arthritis    hands   Cataract    Diabetes mellitus without complication (HCC)    type 2   Family history of adverse reaction to anesthesia    son has malignant hyperthermia, daughter does not daughter recently had c section without problems   GERD (gastroesophageal reflux disease)    Headache    sinus   Hyperlipidemia    Hypertension    PONV (postoperative nausea and vomiting)    nausea only   Ulcer, stomach peptic yrs ago   Past Surgical History:  Procedure Laterality Date   APPENDECTOMY     both hells bone spur repair     both heels with metal clips   both shoulder rotator cuff repair     CESAREAN SECTION     x 1   COLONOSCOPY WITH PROPOFOL  N/A 09/07/2016   Procedure: COLONOSCOPY WITH PROPOFOL ;  Surgeon: Garrett Kallman, MD;  Location: [facility];  Service: Endoscopy;  Laterality: N/A;   colonscopy  06/2011   polyps   ELBOW FRACTURE SURGERY Left    EYE SURGERY     FRACTURE SURGERY     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/10/2022   Procedure: REVERSE SHOULDER ARTHROPLASTY;  Surgeon: Ellard Gunning, MD;  Location: [facility];  Service: Orthopedics;  Laterality: Right;  Please follow in room 6 if able   TUBAL LIGATION     VESICO-VAGINAL FISTULA REPAIR     Patient Active Problem List   Diagnosis Date Noted   Gastroesophageal reflux disease 11/16/2021   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 08/18/2021   Dermatofibroma 08/18/2021   History of malignant neoplasm of skin 08/18/2021   Lentigo 08/18/2021   Melanocytic nevi of trunk 08/18/2021   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   Rosacea 08/18/2021   Actinic keratosis 08/18/2021   Sensorineural hearing loss (SNHL) of left ear with unrestricted hearing of right ear 07/26/2021   Tinnitus of left ear 07/26/2021   Acute recurrent maxillary sinusitis 06/22/2021   Primary hypertension 01/12/2021   Hyperlipidemia associated with type 2 diabetes mellitus (HCC) 01/12/2021   Arthritis 01/12/2021   Type II diabetes mellitus (HCC) 11/25/2019   Ingrown toenail 09/05/2018   Neck pain 01/29/2018   Tick bite 10/24/2017    PCP: Ike Malady, [name]  REFERRING PROVIDER: Alfredo Ano, MD  REFERRING DIAG: s/p right reverse TSA  THERAPY DIAG:  Muscle weakness (generalized)  Stiffness of right shoulder, not elsewhere classified  Localized edema  Cervicalgia  Rationale for Evaluation and Treatment: Rehabilitation  ONSET DATE: 11/05/22  SUBJECTIVE:  SUBJECTIVE STATEMENT:  Patient saw sports med MD, got an injection in the left shoulder , She got good relief of the left shoulder pain has an US  scheduled for the right shoulder June 17th.  Reports a colonoscopy yesterday reports a lot of neck pain today.  Left shoulder showed bursitis, tenodesis PAIN:  Are you having pain? Yes: NPRS scale: 6/10 Pain location: right shoulder, HA Pain description: dull ache at rest, sharp with motions Aggravating factors: quick motions, movements pain up to 10/10 Relieving factors: ice, rest, Tylenol  at best 3/10  PRECAUTIONS: Shoulder Protocol in the chart  RED FLAGS: None   WEIGHT BEARING RESTRICTIONS: No  FALLS:  Has patient fallen in last 6 months?  Yes. Number of falls 1  LIVING ENVIRONMENT: Lives with: lives alone Lives in: House/apartment Stairs: No Has following equipment at home: None  OCCUPATION: retired  PLOF: Independent  PATIENT GOALS:dress without difficulty, do hair, have good ROM and less pain  NEXT MD VISIT:   OBJECTIVE:   DIAGNOSTIC FINDINGS:  See above  PATIENT SURVEYS:  FOTO 21  COGNITION: Overall cognitive status: Within functional limits for tasks assessed     SENSATION: WFL  POSTURE: Fwd head, rounded shoulders, elevated and guarded shoulder  UPPER EXTREMITY ROM:   Active ROM Right PROM eval Right AROM  08/31/23 PROM Supine 01/05/23 PROM  01/26/23 PROM 02/09/23 AROM sitting 02/14/23 AROM 02/21/23 AROM  03/02/23 AROM  03/22/23 AAROM 03/28/23 AROM Standing 04/13/23 AROM Standing 05/02/23 AROM  Standing 05/11/23 AROM  Standing 05/25/23 AROM Standing 06/01/23 AROM Standing  07/06/23 AROM  Standing 07/27/23 AROM Standing 08/31/23  Shoulder flexion 35 90 118  123 90 96 95 100 111 110 112 120 125  129 132 130 105  Shoulder extension 5                   Shoulder abduction 20 95 90 92  73 80 83 90 93 90 92 98 100 102  105 95 81  Shoulder adduction                    Shoulder internal rotation 15 35            40 40  42 25  Shoulder external rotation 20 65 45 30  41  45 50 53 56 56 60 66 67 70 70  47  Elbow flexion 120                   Elbow extension 5                   Wrist flexion                    Wrist extension                    Wrist ulnar deviation                    Wrist radial deviation                    Wrist pronation                    Wrist supination                    (Blank rows = not tested)  UPPER EXTREMITY MMT:  No tested due to recent surgery  MMT Right eval Left eval  Shoulder flexion  Shoulder extension    Shoulder abduction    Shoulder adduction    Shoulder internal rotation    Shoulder external rotation    Middle trapezius    Lower trapezius     Elbow flexion    Elbow extension    Wrist flexion    Wrist extension    Wrist ulnar deviation    Wrist radial deviation    Wrist pronation    Wrist supination    Grip strength (lbs)    (Blank rows = not tested)  PALPATION:  Very tight and tender in the pectoral, upper trap, the entire right upper arm   TODAY'S TREATMENT:                                                                                                                                         DATE:  09/07/23 Reviewed US  of the left shoulder and x-rays as patient had questions PROM bilateral shoulders STM to the upper traps, rhomboids, cervical area Vaso to bilateral shoulders medium pressure   09/05/23 Nustep level 3 x 5 minutes UBE level 2 x 3 minutes US  to the left shoulder/upper arm STM to the neck and upper traps Passive stretch shoulders She then went to OT and after I put her on the VASO  08/31/23 Discussion as noted below, measured both shoulder AROM in standing as noted above STM to the right upper arm and shoulder Passive stretch of the right shoulder to her point of pain Vaso bilateral shoulders for pain medium pressure   08/03/23 Pateint had a lot of questions regarding the bloodwork and the bone scan and what they are looking for, I strongly advised her to follow the MD/PA orders to wear sling and not use the arm, I did my best to answer her questions.  Finished with Vaso medium pressure 34 degrees  07/27/23 Gentle shoulder distractions Gentle PROM to her tolerance, a lot of cues to relax Neural tension stretches of the right UE STM to the upper trap, neck and rhomboid STM to the biceps, deltoid and pectoral area  07/25/23 UBE level 3 x 4 minutes for mm perfusion Lats 15# too painful Seated row 10# Doorway stretch PROM right shoulder all motions to tolerance STM to the tight upper arm and the right pectoral area  07/20/23 UBE level 3 x 4 minutes Ball vs wall Rolling ball up wall Small  weighted ball toss 2 ways and then with her doing ER Passive stretch right shoulder STM to the right shoulder and upper arm  07/18/23 Passive stretch right shoulder Gentle joint mobs  UE neural tension stretches STM to the right pectoral, right biceps, deltoid area Wall slides Wall circles Doorway stretch  PATIENT EDUCATION: Education details: poc/hep Person educated: Patient Education method: Programmer, multimedia, Facilities manager, Actor cues, Verbal cues, and Handouts Education comprehension: verbalized understanding  HOME EXERCISE PROGRAM: Access Code: [email or web address] URL: [email or web address] Date: 12/20/2022  Prepared by: Cherylene Corrente  Exercises - Seated Shoulder Flexion Towel Slide at Table Top  - 2 x daily - 7 x weekly - 2 sets - 10 reps - 3 hold - Seated Shoulder External Rotation PROM on Table  - 2 x daily - 7 x weekly - 2 sets - 10 reps - 3 hold - Seated Elbow Extension and Shoulder External Rotation AAROM at Table with Towel  - 2 x daily - 7 x weekly - 2 sets - 10 reps - 3 hold - Circular Shoulder Pendulum with Table Support  - 2 x daily - 7 x weekly - 2 sets - 10 reps - 3 hold  Access Code: [email or web address] URL: [email or web address] Date: 07/13/2023 Prepared by: Cherylene Corrente  Exercises - Isometric Shoulder Flexion at Wall  - 1 x daily - 7 x weekly - 1 sets - 10 reps - 3 hold - Standing Isometric Shoulder Internal Rotation at Doorway  - 1 x daily - 7 x weekly - 1 sets - 10 reps - 3 hold - Isometric Shoulder Extension at Wall  - 1 x daily - 7 x weekly - 1 sets - 10 reps - 3 hold - Isometric Shoulder Abduction at Wall  - 1 x daily - 7 x weekly - 1 sets - 10 reps - 3 hold - Standing Isometric Shoulder External Rotation with Doorway  - 1 x daily - 7 x weekly - 1 sets - 10 reps - 3 hold  ASSESSMENT:  CLINICAL IMPRESSION:  Light exercise, ligth stretch and focus today was on STM to the neck and upper traps as she was very tight and very sore, MD Alease Hunter).   Feels that the left shoulder is bursitis, tendonitis, with impingement.  He did send an order for PT to evaluate and treat and we will do that at a later time.  He did feel that the right shoulder needs to be assessed  with the US   Patient saw surgeon, the bone scan said did not feel that there was infection or loosening however the surgeon is not so sure, he wants to do an US  to be sure, that is scheduled for the next few weeks, since she has not been to PT in a month I re-measured her ROM, she has lost at least 15 degrees of motion and more for all motions.  She is also having more pain in the left shoulder possibly from overdoing it, because she is trying to not use the right shoulder.  She has talked about this with her primary and I suggested that she see a sports medicine MD, she called while she was here and will have appointment with him next week.  As far as the right shoulder the Surgeon stated in his notes to resume formal PT on a conservative basis to help with ROM and pain until the US  is performed   OBJECTIVE IMPAIRMENTS: cardiopulmonary status limiting activity, decreased activity tolerance, decreased endurance, decreased ROM, decreased strength, increased edema, increased muscle spasms, impaired flexibility, impaired UE functional use, improper body mechanics, postural dysfunction, and pain.  REHAB POTENTIAL: Good  CLINICAL DECISION MAKING: Evolving/moderate complexity  EVALUATION COMPLEXITY: Low   GOALS: Goals reviewed with patient? Yes  SHORT TERM GOALS: Target date: 01/01/23  Independent with initial HEP Goal status: 12/27/22 MET  LONG TERM GOALS: Target date: 03/22/23  Decrease pain 50% Goal status: progressing 07/25/23  2.  Dress without difficulty Goal status: progressing 07/25/23  3.  Do hair without difficulty Goal status:  able to reach the top of head at times progressing 2/20/253/25/25 4.  Increase AROM right shoulder flexion to 130 degrees Goal status: 07/25/23  progressing 130 degrees with pain  5.  Increase right shoulder ER to 60 degrees Goal status: met 07/06/23  6.  Return to water  aerobics and or gym activity Goal status:progressing met 06/01/23  PLAN:  PT FREQUENCY: 1-2x/week  PT DURATION: 12 weeks  PLANNED INTERVENTIONS: Therapeutic exercises, Therapeutic activity, Neuromuscular re-education, Balance training, Gait training, Patient/Family education, Self Care, Joint mobilization, Dry Needling, Electrical stimulation, Cryotherapy, Vasopneumatic device, and Manual therapy  PLAN FOR NEXT SESSION:   will resume PT on the right shoulder conservatively to try to get back lost ROM and help with pain until she has US , will evaluate left shoulder soon 09/07/2023, 7:56 AM [facility] [address] [facility]. [place], [place], [address] Phone: [phone]   Fax:  [phone]

## 2023-09-07 NOTE — Progress Notes (Signed)
 Left shoulder x-ray shows evidence of chronic rotator cuff tendinitis and what looks like a loose body of extra calcium  floating around in the shoulder joint.

## 2023-09-08 ENCOUNTER — Encounter: Payer: Self-pay | Admitting: Family

## 2023-09-08 ENCOUNTER — Other Ambulatory Visit: Payer: Self-pay | Admitting: Family

## 2023-09-08 MED ORDER — SEMAGLUTIDE (2 MG/DOSE) 8 MG/3ML ~~LOC~~ SOPN: 2.0000 mg | PEN_INJECTOR | SUBCUTANEOUS | 0 refills | Status: DC

## 2023-09-11 ENCOUNTER — Encounter: Payer: Self-pay | Admitting: Occupational Therapy

## 2023-09-11 ENCOUNTER — Ambulatory Visit: Admitting: Occupational Therapy

## 2023-09-11 DIAGNOSIS — M25642 Stiffness of left hand, not elsewhere classified: Secondary | ICD-10-CM | POA: Diagnosis not present

## 2023-09-11 DIAGNOSIS — M25641 Stiffness of right hand, not elsewhere classified: Secondary | ICD-10-CM | POA: Diagnosis not present

## 2023-09-11 DIAGNOSIS — M79642 Pain in left hand: Secondary | ICD-10-CM

## 2023-09-11 DIAGNOSIS — G8929 Other chronic pain: Secondary | ICD-10-CM | POA: Diagnosis not present

## 2023-09-11 DIAGNOSIS — M79641 Pain in right hand: Secondary | ICD-10-CM | POA: Diagnosis not present

## 2023-09-11 DIAGNOSIS — M542 Cervicalgia: Secondary | ICD-10-CM | POA: Diagnosis not present

## 2023-09-11 DIAGNOSIS — M25611 Stiffness of right shoulder, not elsewhere classified: Secondary | ICD-10-CM | POA: Diagnosis not present

## 2023-09-11 DIAGNOSIS — M6281 Muscle weakness (generalized): Secondary | ICD-10-CM

## 2023-09-11 DIAGNOSIS — R278 Other lack of coordination: Secondary | ICD-10-CM | POA: Diagnosis not present

## 2023-09-11 DIAGNOSIS — R600 Localized edema: Secondary | ICD-10-CM | POA: Diagnosis not present

## 2023-09-11 DIAGNOSIS — R252 Cramp and spasm: Secondary | ICD-10-CM | POA: Diagnosis not present

## 2023-09-11 DIAGNOSIS — M25511 Pain in right shoulder: Secondary | ICD-10-CM | POA: Diagnosis not present

## 2023-09-11 NOTE — Therapy (Addendum)
 OUTPATIENT OCCUPATIONAL THERAPY ORTHO Treatment  Patient Name: Veronica Galvan MRN: [id number] DOB:1955/05/30, 68 y.o., female Today's Date: 09/11/2023  PCP: Adra Alanis [name] REFERRING PROVIDER: Adra Alanis [name]  END OF SESSION:  OT End of Session - 09/11/23 1417     Visit Number 23    Number of Visits 41    Date for OT Re-Evaluation 11/07/23    Authorization Type [employer] M[employer]    Authorization Time Period 12 weeks    Authorization - Visit Number 23    Progress Note Due on Visit 26    OT Start Time 1312    OT Stop Time 1400    OT Time Calculation (min) 48 min                      Past Medical History:  Diagnosis Date   Allergy     Anxiety    Arthritis    hands   Cataract    Diabetes mellitus without complication (HCC)    type 2   Family history of adverse reaction to anesthesia    son has malignant hyperthermia, daughter does not daughter recently had c section without problems   GERD (gastroesophageal reflux disease)    Headache    sinus   Hyperlipidemia    Hypertension    PONV (postoperative nausea and vomiting)    nausea only   Ulcer, stomach peptic yrs ago   Past Surgical History:  Procedure Laterality Date   APPENDECTOMY     both hells bone spur repair     both heels with metal clips   both shoulder rotator cuff repair     CESAREAN SECTION     x 1   COLONOSCOPY WITH PROPOFOL  N/A 09/07/2016   Procedure: COLONOSCOPY WITH PROPOFOL ;  Surgeon: Garrett Kallman, MD;  Location: [facility];  Service: Endoscopy;  Laterality: N/A;   colonscopy  06/2011   polyps   ELBOW FRACTURE SURGERY Left    EYE SURGERY     FRACTURE SURGERY     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/10/2022   Procedure: REVERSE SHOULDER ARTHROPLASTY;  Surgeon: Ellard Gunning, MD;  Location: [facility];  Service: Orthopedics;  Laterality: Right;  Please follow in room 6 if able   TUBAL LIGATION     VESICO-VAGINAL FISTULA REPAIR     Patient Active Problem List    Diagnosis Date Noted   Gastroesophageal reflux disease 11/16/2021   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 08/18/2021   Dermatofibroma 08/18/2021   History of malignant neoplasm of skin 08/18/2021   Lentigo 08/18/2021   Melanocytic nevi of trunk 08/18/2021   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   Rosacea 08/18/2021   Actinic keratosis 08/18/2021   Sensorineural hearing loss (SNHL) of left ear with unrestricted hearing of right ear 07/26/2021   Tinnitus of left ear 07/26/2021   Acute recurrent maxillary sinusitis 06/22/2021   Primary hypertension 01/12/2021   Hyperlipidemia associated with type 2 diabetes mellitus (HCC) 01/12/2021   Arthritis 01/12/2021   Type II diabetes mellitus (HCC) 11/25/2019   Ingrown toenail 09/05/2018   Neck pain 01/29/2018   Tick bite 10/24/2017    ONSET DATE: 05/19/23  REFERRING DIAG:  Diagnosis  M79.641,M79.642 (ICD-10-CM) - Bilateral hand pain    THERAPY DIAG:  Muscle weakness (generalized)  Pain in right hand  Pain in left hand  Other lack of coordination  Stiffness of left hand, not elsewhere classified  Stiffness of right hand, not elsewhere classified  Rationale for Evaluation and Treatment: Rehabilitation  SUBJECTIVE:   SUBJECTIVE STATEMENT: Pt reports  her middle finger right hand hurts alot Pt accompanied by: self  PERTINENT HISTORY: 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 Yes: NPRS scale: 6/10- right hand Pain location: right hand,  4-5/10 left hand Pain description: aching, stiff Aggravating factors: gripping  Relieving factors: heat, meds Pt also has shoulder pain which is more significant grossly 8/10 10 which OT will not address. Pt is on hold from  PT pending further testing  FALLS: Has patient fallen in last 6 months? No  LIVING ENVIRONMENT: Lives with: lives alone Lives in: House/apartment Stairs: yes   PLOF: Independent  PATIENT GOALS: improve functional use of hands and learn adapted strategies for ADLs/IADLs.  NEXT MD VISIT: unknown  OBJECTIVE:  Note: Objective measures were completed at Evaluation unless otherwise noted.  HAND DOMINANCE: Right  ADLs: Overall ADLs: unable to grip credit card to remove from ATM Transfers/ambulation related to ADLs: mod I Eating: drops silverware Grooming: drops toothbrush, uses electric toothbrush, holding comb Upper body dressing: adjusting bra Lower body dressing: difficulty pulling up pants  Toileting: hygeine was difficult initally Bathing: mod I, difficult with shaving Tub shower transfers: walk in shower    FUNCTIONAL OUTCOME MEASURES: Quick Dash: 05/30/23- 75%% disability Quick Dash 08/15/23- 70.5% disability   UPPER EXTREMITY ROM:   RUE wrist flexion/ extension: 70/ 50, LUE wrist flexion/ extension: 70/ 45 Pt demonstrates grossly 50% composite flexion for right and 555 with left.   Active ROM Right eval Left eval  Thumb MCP (0-60) 45 55  Thumb IP (0-80) 25 10  Thumb Radial abd/add (0-55)     Thumb Palmar abd/add (0-45)     Thumb Opposition to Small Finger yes yes   Index MCP (0-90)     Index PIP (0-100)     Index DIP (0-70)      Long MCP (0-90)      Long PIP (0-100)      Long DIP (0-70)      Ring MCP (0-90)      Ring PIP (0-100)      Ring DIP (0-70)      Little MCP (0-90)      Little PIP (0-100)      Little DIP (0-70)      (Blank rows = not tested) 9 hole peg test (08/15/23) RUE 26.70 secs, LUE 23 secs  HAND FUNCTION: Grip strength: Right: 8 lbs; Left: 4 lbs,          08/15/23-  RUE 18, LUE 14 lbs,           08/15/23   Pinch: RUE tip 4lbs, lateral 8 lbs, LUE tip 3 lbs, lateral 8 lbs   SENSATION: Light touch: Impaired index finger LUE  EDEMA: Pt  with bony defomities at PIP joints of all digits which pt reports are bone spurs, Pt also has bony deformity at DIP for right thumb, index and 5th digit and LUE small and index fingers  COGNITION: Overall cognitive status: Within functional limits for tasks assessed   OBSERVATIONS: Pleasant female desires to increase functional use of bilateral UE's  , TREATMENT DATE:09/11/23-Paraffin to RUE x 10 mins, no adverse reactions, while therapist performed US  to LUE 3.3 mhz, 0.8  w/cm 2,  20%x 8 mins no adverse reactions for stiffness. Paraffin to LUE x 10 mins, no adverse reactions, while therapist performed US  to RUE 3.3 mhz, 0.8 w/cm 2,  20%x 8 mins no adverse reactions for stiffness.  P/ROM to RUE PIP joints individually with exception of right middle finger then compositely in flexion followed by passive stretch to digits of LUE individually in composite flexion then all digits together for passive composite finger flexion. A/ROM individual finger extension for bilateral UE's. Reviewed yellow theraputty HEP for sustained grip and tip pinch bilateral UE's with exception of right middle finger.  09/07/23-Paraffin to RUE x 10 mins, no adverse reactions, while therapist performed US  to LUE 3.3 mhz, 0.8 w/cm 2,  20%x 8 mins no adverse reactions for stiffness. Paraffin to LUE x 10 mins, no adverse reactions, while therapist performed US  to RUE 3.3 mhz, 0.8 w/cm 2,  20%x 8 mins no adverse reactions for stiffness.  P/ROM to RUE PIP joints individually then compositely in flexion followed by passive stretch to digits of LUE individually in composite flexion then all digits together for passive composite finger flexion. A/ROM individual finger extension for bilateral UE's Washcloth scrunches and flattening with bilateral UE's min v.c  composite and individual finger flexion with resistive squeeze ball(tree) 10-20 reps each bilateral UE's   09/05/23-Paraffin to RUE x 10 mins, no adverse reactions, while therapist  performed US  to LUE 3.3 mhz, 0.8 w/cm 2,  20%x 8 mins no adverse reactions for stiffness. Paraffin to LUE x 10 mins, no adverse reactions, while therapist performed US  to RUE 3.3 mhz, 0.8 w/cm 2,  20%x 8 mins no adverse reactions for stiffness.  P/ROM to RUE PIP joints individually then compositely in flexion followed by passive stretch to digits of LUE individually in composite flexion then all digits together for passive composite finger flexion. A/ROM individual finger extension for bilateral UE's, then digiflex light resistance for individual and composite flexion. Pt transitioned to composite flexion with resistive squeeze ball due to discomfort with digit flex for RUE.   PATIENT EDUCATION: Education details:  yellow theraputty review Person educated: Patient Education method: Explanation, demonstration, v.c Education comprehension: verbalized understanding, returned demonstration  HOME EXERCISE PROGRAM: AROM             yellow  putty  GOALS: Goals reviewed with patient? Yes  SHORT TERM GOALS: Target date: 09/14/23  I with HEP  Goal status:  met 06/07/23  2.  I with positioning/ splinting prn to minimize pain and defomity   Goal status: met, 07/13/23  3.  Pt will increase bilateral grip strength by 5 lbs for increased UE functional use. Upgraded goal Pt will increase bilateral grip strength to at least 25 lbs (after stretching). Baseline: 05/30/23- RUE 8 lbs, LUE 4 lbs Goal status:met 08/01/23-R 20 lbs, L  19 lbs,  08/15/23- RUE 18 lbsLUE 14 lbs continue to address upgraded goal.  4. Pt will demonstrate improved RUE fine motor coordiantion as evidenced by perfroming 9 hole peg test in 23 secs or less.  Goal status: new 5. I with updates to HEP.  Goal status: ongoing 6. Pt will demonstrate improved composite finger flexion for ADLs as evidenced by pt. bringing middle fingertip for RUE within 1/2 an inch of palm.  Baseline: 3/4 inch from palm to middle fingertip  Goal status :  ongoing 7.Pt will demonstrate improved composite finger flexion for ADLs as evidenced by pt. bringing middle fingertip for LUE within 3/4 an inch of palm.  Baseline: 1 inch from palm to middle finger tip  Goal status: ongoing  LONG TERM GOALS: Target date:11/07/23  I with updated HEP  Goal status:met 08/15/22  2.  Pt will improve Quick Dash score to 70% disability Baseline: 75% (05/30/23) Goal status:   improved - however not fully met due to complications from shoulder pain-70.5%08/15/23  3.  Pt will demonstrate at least 65% composte flexion for LUE for increased functional use.  Goal status: met 65-70% , 08/15/23  4.  I with adapted strategies/ adapted equipment to minimize pain and to increase pt I with ADLs/IADLs  Goal status:  met for inital strategies, however continue goal as pt may report additional tasks that can benefit from modification.08/15/23  5.  Pt will demonstrate at least 65% composite flexion for RUE for increased functional use.  Goal status: met 70% 08/15/23  6. Pt will report that she is able to cut her meat such as steak or chicken modified independently.  [place]: unable  Goal status: ongoing    7. Pt will report increased ease with opening/ closing ziplock bags.    Goal status: ongoing    8. Pt will improve bilateral tip pinch by 2 lbs for increased ease with daily activities.     Goal status: ongoing    9. Pt will improve bilateral lateral pinch by 2 lbs for increased ease with ADLs.    Goal status: ongoing   ASSESSMENT: CLINICAL IMPRESSION: Pt is progressing towards goals.She arrived with greater pain in right middle finger today however it was improved at end of session following modalities and gentle stretching.PERFORMANCE DEFICITS: in functional skills including ADLs, IADLs, coordination, sensation, edema, ROM, strength, pain, flexibility, Fine motor control, Gross motor control, endurance, decreased knowledge of precautions, decreased knowledge of use  of DME, and UE functional use,, and psychosocial skills including coping strategies, environmental adaptation, habits, interpersonal interactions, and routines and behaviors.   IMPAIRMENTS: are limiting patient from ADLs, IADLs, rest and sleep, education, play, leisure, and social participation.   COMORBIDITIES: may have co-morbidities  that affects occupational performance. Patient will benefit from skilled OT to address above impairments and improve overall function.  MODIFICATION OR ASSISTANCE TO COMPLETE EVALUATION: No modification of tasks or assist necessary to complete an evaluation.  OT OCCUPATIONAL PROFILE AND HISTORY: Detailed assessment: Review of records and additional review of physical, cognitive, psychosocial history related to current functional performance.  CLINICAL DECISION MAKING: LOW - limited treatment options, no task modification necessary  REHAB POTENTIAL: Good  EVALUATION COMPLEXITY: Low      PLAN:  OT FREQUENCY: 2x/week  OT DURATION: 12 weeks( anticipate d/c after 8 weeks dependent on progress)  PLANNED INTERVENTIONS: 09811 OT Re-evaluation, 97535 self care/ADL training, 91478 therapeutic exercise, 97530 therapeutic activity, 97112 neuromuscular re-education, 97140 manual therapy, 97113 aquatic therapy, 97035 ultrasound, 97018 paraffin, 29562 fluidotherapy, 97010 moist heat, 97010 cryotherapy, 97034 contrast bath, 97760 Orthotics management and training, 13086 Splinting (initial encounter), [id number] Subsequent splinting/medication, passive range of motion, energy conservation, coping strategies training, patient/family education, and DME and/or AE instructions  RECOMMENDED OTHER SERVICES: none  CONSULTED AND AGREED WITH PLAN OF CARE: Patient  PLAN FOR NEXT SESSION:  ROM, gentle strengthening, functional use   Jumaane Weatherford, OT 09/11/2023, 2:18 PM

## 2023-09-12 ENCOUNTER — Other Ambulatory Visit: Payer: Self-pay | Admitting: Family

## 2023-09-13 ENCOUNTER — Ambulatory Visit: Admitting: Physical Therapy

## 2023-09-13 ENCOUNTER — Encounter: Payer: Self-pay | Admitting: Physical Therapy

## 2023-09-13 ENCOUNTER — Ambulatory Visit (INDEPENDENT_AMBULATORY_CARE_PROVIDER_SITE_OTHER)

## 2023-09-13 VITALS — BP 122/84 | Ht 61.0 in | Wt 191.0 lb

## 2023-09-13 DIAGNOSIS — M25511 Pain in right shoulder: Secondary | ICD-10-CM | POA: Diagnosis not present

## 2023-09-13 DIAGNOSIS — R600 Localized edema: Secondary | ICD-10-CM | POA: Diagnosis not present

## 2023-09-13 DIAGNOSIS — M79642 Pain in left hand: Secondary | ICD-10-CM | POA: Diagnosis not present

## 2023-09-13 DIAGNOSIS — M25611 Stiffness of right shoulder, not elsewhere classified: Secondary | ICD-10-CM | POA: Diagnosis not present

## 2023-09-13 DIAGNOSIS — Z2821 Immunization not carried out because of patient refusal: Secondary | ICD-10-CM | POA: Diagnosis not present

## 2023-09-13 DIAGNOSIS — M542 Cervicalgia: Secondary | ICD-10-CM

## 2023-09-13 DIAGNOSIS — Z0000 Encounter for general adult medical examination without abnormal findings: Secondary | ICD-10-CM | POA: Diagnosis not present

## 2023-09-13 DIAGNOSIS — M25641 Stiffness of right hand, not elsewhere classified: Secondary | ICD-10-CM | POA: Diagnosis not present

## 2023-09-13 DIAGNOSIS — M79641 Pain in right hand: Secondary | ICD-10-CM | POA: Diagnosis not present

## 2023-09-13 DIAGNOSIS — R278 Other lack of coordination: Secondary | ICD-10-CM | POA: Diagnosis not present

## 2023-09-13 DIAGNOSIS — G8929 Other chronic pain: Secondary | ICD-10-CM | POA: Diagnosis not present

## 2023-09-13 DIAGNOSIS — M6281 Muscle weakness (generalized): Secondary | ICD-10-CM

## 2023-09-13 DIAGNOSIS — M25642 Stiffness of left hand, not elsewhere classified: Secondary | ICD-10-CM | POA: Diagnosis not present

## 2023-09-13 DIAGNOSIS — R252 Cramp and spasm: Secondary | ICD-10-CM | POA: Diagnosis not present

## 2023-09-13 NOTE — Progress Notes (Signed)
 Because this visit was a virtual/telehealth visit,  certain criteria was not obtained, such a blood pressure, CBG if applicable, and timed get up and go. Any medications not marked as "taking" were not mentioned during the medication reconciliation part of the visit. Any vitals not documented were not able to be obtained due to this being a telehealth visit or patient was unable to self-report a recent blood pressure reading due to a lack of equipment at home via telehealth. Vitals that have been documented are verbally provided by the patient.   This visit was performed by a medical professional under my direct supervision. I was immediately available for consultation/collaboration. I have reviewed and agree with the Annual Wellness Visit documentation.  Subjective:   Veronica Galvan is a 68 y.o. who presents for a Medicare Wellness preventive visit.  As a reminder, Annual Wellness Visits don't include a physical exam, and some assessments may be limited, especially if this visit is performed virtually. We may recommend an in-person visit if needed.  Visit Complete: Virtual I connected with  Veronica Galvan on 09/13/23 by a audio enabled telemedicine application and verified that I am speaking with the correct person using two identifiers.  Patient Location: Home  Provider Location: Home Office  I discussed the limitations of evaluation and management by telemedicine. The patient expressed understanding and agreed to proceed.  Vital Signs: Because this visit was a virtual/telehealth visit, some criteria may be missing or patient reported. Any vitals not documented were not able to be obtained and vitals that have been documented are patient reported.  VideoDeclined- This patient declined Librarian, academic. Therefore the visit was completed with audio only.  Persons Participating in Visit: Patient.  AWV Questionnaire: No: Patient Medicare AWV questionnaire was  not completed prior to this visit.  Cardiac Risk Factors include: advanced age (>69men, >3 women);diabetes mellitus;obesity (BMI >30kg/m2);hypertension;dyslipidemia     Objective:     Today's Vitals   09/13/23 1117  BP: 122/84  Weight: 191 lb (86.6 kg)  Height: 5\' 1"  (1.549 m)   Body mass index is 36.09 kg/m.     09/13/2023   11:16 AM 12/20/2022    8:01 AM 11/09/2022    8:24 AM 08/29/2022   10:26 AM 12/01/2021   10:23 AM 08/19/2021    9:43 AM 08/25/2016   11:43 AM  Advanced Directives  Does Patient Have a Medical Advance Directive? No No Yes Yes Yes Yes Yes  Type of Surveyor, minerals;Living will Healthcare Power of [place];Living will Healthcare Power of [place];Living will Healthcare Power of [place];Living will Healthcare Power of [place];Living will  Does patient want to make changes to medical advance directive?   No - Patient declined  No - Patient declined  No - Patient declined  Copy of Healthcare Power of Attorney in Chart?    No - copy requested  No - copy requested   Would patient like information on creating a medical advance directive? No - Patient declined No - Patient declined         Current Medications (verified) Outpatient Encounter Medications as of 09/13/2023  Medication Sig   acetaminophen  (TYLENOL ) 650 MG CR tablet Take 1,300 mg by mouth every 8 (eight) hours as needed for pain.   ALPRAZolam  (XANAX ) 0.5 MG tablet Take 1 tablet (0.5 mg total) by mouth at bedtime as needed for anxiety.   Ascorbic Acid (VITAMIN C) 1000 MG tablet Take 1,000 mg by mouth every  morning.   aspirin EC 81 MG tablet Take 81 mg by mouth every morning.   atorvastatin  (LIPITOR) 40 MG tablet TAKE ONE TABLET BY MOUTH EVERY EVENING   Blood Glucose Monitoring Suppl DEVI 1 each by Does not apply route in the morning, at noon, and at bedtime. May substitute to any manufacturer covered by patient's insurance.   Calcium  Carb-Cholecalciferol (CALCIUM  600+D3 PO) Take  1 tablet by mouth every morning.   calcium  carbonate (TUMS - DOSED IN MG ELEMENTAL CALCIUM ) 500 MG chewable tablet Chew 2 tablets by mouth daily as needed for indigestion or heartburn.   carboxymethylcellulose (REFRESH TEARS) 0.5 % SOLN Place 1 drop into both eyes 2 (two) times daily.   celecoxib  (CELEBREX ) 200 MG capsule TAKE 1 CAPSULE BY MOUTH DAILY   Coenzyme Q10 300 MG CAPS Take 1 capsule by mouth every morning.   diclofenac Sodium (VOLTAREN) 1 % GEL Apply 1 Application topically 2 (two) times daily.   fluticasone  (FLONASE ) 50 MCG/ACT nasal spray Place 2 sprays into both nostrils daily.   glucose blood test strip Use as instructed   levocetirizine (XYZAL ) 5 MG tablet Take 1 tablet (5 mg total) by mouth every evening.   lisinopril  (ZESTRIL ) 5 MG tablet TAKE 1 TABLET BY MOUTH 2 TIMES A DAY   LUTEIN-ZEAXANTHIN PO Take 1 tablet by mouth every morning.   MAGNESIUM CITRATE PO Take 500 mg by mouth daily.   Multiple Vitamins-Minerals (MULTIVITAMIN WITH MINERALS) tablet Take 1 tablet by mouth every morning.   Omega-3 Fatty Acids (FISH OIL) 1000 MG CAPS Take 2,000 mg by mouth daily.   omeprazole  (PRILOSEC) 20 MG capsule TAKE 1 CAPSULE BY MOUTH EVERY MORNING   polyethylene glycol powder (GLYCOLAX /MIRALAX ) 17 GM/SCOOP powder Take 238 g by mouth daily.   RESTASIS 0.05 % ophthalmic emulsion Apply 1 drop to eye.   Semaglutide , 2 MG/DOSE, 8 MG/3ML SOPN Inject 2 mg as directed once a week.   sodium chloride  (MURO 128) 2 % ophthalmic solution Place 1 drop into both eyes 2 (two) times daily.   SYNJARDY  XR 12.08-998 MG TB24 Take 1 tablet by mouth 2 (two) times daily.   TURMERIC PO Take 2,000 mg by mouth daily.   UNABLE TO FIND Take 1 tablet by mouth daily. Cinsulin supplement   vitamin E 180 MG (400 UNITS) capsule Take 400 Units by mouth daily.   No facility-administered encounter medications on file as of 09/13/2023.    Allergies (verified) Codeine, Benzonatate, and Epinephrine   History: Past Medical  History:  Diagnosis Date   Allergy     Anxiety    Arthritis    hands   Cataract    Diabetes mellitus without complication (HCC)    type 2   Family history of adverse reaction to anesthesia    son has malignant hyperthermia, daughter does not daughter recently had c section without problems   GERD (gastroesophageal reflux disease)    Headache    sinus   Hyperlipidemia    Hypertension    PONV (postoperative nausea and vomiting)    nausea only   Ulcer, stomach peptic yrs ago   Past Surgical History:  Procedure Laterality Date   APPENDECTOMY     both hells bone spur repair     both heels with metal clips   both shoulder rotator cuff repair     CESAREAN SECTION     x 1   COLONOSCOPY WITH PROPOFOL  N/A 09/07/2016   Procedure: COLONOSCOPY WITH PROPOFOL ;  Surgeon: Garrett Kallman, MD;  Location: [facility];  Service: Endoscopy;  Laterality: N/A;   colonscopy  06/2011   polyps   ELBOW FRACTURE SURGERY Left    EYE SURGERY     FRACTURE SURGERY     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/10/2022   Procedure: REVERSE SHOULDER ARTHROPLASTY;  Surgeon: Ellard Gunning, MD;  Location: [facility];  Service: Orthopedics;  Laterality: Right;  Please follow in room 6 if able   TUBAL LIGATION     VESICO-VAGINAL FISTULA REPAIR     Family History  Problem Relation Age of Onset   Hypertension Mother    COPD Mother    Cancer Mother    Arthritis Mother    Hypertension Father    Hyperlipidemia Father    Diabetes Father    Cancer Father    Alcohol abuse Father    Diabetes Brother    Alcohol abuse Brother    Hypertension Brother    Arthritis Maternal Grandmother    Birth defects Maternal Grandmother    Diabetes Paternal Grandmother    Heart disease Paternal Grandfather    Diabetes Paternal Aunt    Diabetes Paternal Aunt    Obesity Son    Social History   Socioeconomic History   Marital status: Widowed    Spouse name: Not on file   Number of children: Not on file   Years of  education: Not on file   Highest education level: Bachelor's degree (e.g., BA, AB, BS)  Occupational History   Not on file  Tobacco Use   Smoking status: Former    Current packs/day: 1.00    Average packs/day: 1 pack/day for 14.0 years (14.0 ttl pk-yrs)    Types: Cigarettes   Smokeless tobacco: Never   Tobacco comments:    quit 31 yrs ago  Substance and Sexual Activity   Alcohol use: Yes    Comment: rare   Drug use: No   Sexual activity: Not Currently    Birth control/protection: Post-menopausal  Other Topics Concern   Not on file  Social History Narrative   Right handed   Caffeine intake none   Social Drivers of Health   Financial Resource Strain: Low Risk  (09/13/2023)   Overall Financial Resource Strain (CARDIA)    Difficulty of Paying Living Expenses: Not hard at all  Food Insecurity: No Food Insecurity (09/13/2023)   Hunger Vital Sign    Worried About Running Out of Food in the Last Year: Never true    Ran Out of Food in the Last Year: Never true  Transportation Needs: No Transportation Needs (09/13/2023)   PRAPARE - Administrator, Civil Service (Medical): No    Lack of Transportation (Non-Medical): No  Physical Activity: Sufficiently Active (09/13/2023)   Exercise Vital Sign    Days of Exercise per Week: 5 days    Minutes of Exercise per Session: 60 min  Stress: No Stress Concern Present (09/13/2023)   [employer] of Occupational Health - Occupational Stress Questionnaire    Feeling of Stress : Not at all  Social Connections: Moderately Integrated (09/13/2023)   Social Connection and Isolation Panel [NHANES]    Frequency of Communication with Friends and Family: More than three times a week    Frequency of Social Gatherings with Friends and Family: More than three times a week    Attends Religious Services: More than 4 times per year    Active Member of [employer] or Organizations: Yes    Attends Banker Meetings: More than  4 times per year     Marital Status: Widowed    Tobacco Counseling Counseling given: Not Answered Tobacco comments: quit 31 yrs ago    Clinical Intake:  Pre-visit preparation completed: Yes  Pain : No/denies pain     BMI - recorded: 36.09 Nutritional Status: BMI > 30  Obese Nutritional Risks: None Diabetes: Yes CBG done?: No Did pt. bring in CBG monitor from home?: No  Lab Results  Component Value Date   HGBA1C 7.0 (H) 08/22/2023   HGBA1C 6.9 (H) 05/19/2023   HGBA1C 6.7 (H) 01/17/2023     What is the last grade level you completed in school?: COllege graduate  Interpreter Needed?: No  Information entered by :: Juliann Ochoa   Activities of Daily Living     09/13/2023   11:20 AM 11/09/2022    8:30 AM  In your present state of health, do you have any difficulty performing the following activities:  Hearing? 0   Vision? 0   Difficulty concentrating or making decisions? 0   Walking or climbing stairs? 0   Dressing or bathing? 0   Doing errands, shopping? 0 0  Preparing Food and eating ? N   Using the Toilet? N   In the past six months, have you accidently leaked urine? N   Do you have problems with loss of bowel control? N   Managing your Medications? N   Managing your Finances? N   Housekeeping or managing your Housekeeping? N     Patient Care Team: Adra Alanis, [name] as PCP - General (Internal Medicine)  Indicate any recent Medical Services you may have received from other than [facility] providers in the past year (date may be approximate).     Assessment:    This is a routine wellness examination for [place].  Hearing/Vision screen Hearing Screening - Comments:: No hearing difficulties  Vision Screening - Comments:: Wears glasses    Goals Addressed             This Visit's Progress    Exercise 3x per week (30 min per time)   On track    Continue to exercise, walk and go to [facility].  Would like to travel more.        Depression Screen      09/13/2023   11:21 AM 09/13/2022    9:10 AM 08/29/2022   10:27 AM 05/19/2022    8:47 AM 05/19/2022    8:46 AM 02/17/2022    9:18 AM 11/16/2021    8:46 AM  PHQ 2/9 Scores  PHQ - 2 Score 0 0 0 0 0 0 0  PHQ- 9 Score 0 0  0  0     Fall Risk     09/13/2023   11:19 AM 01/17/2023    9:11 AM 08/27/2022    5:25 PM 05/19/2022    8:46 AM 02/17/2022    9:12 AM  Fall Risk   Falls in the past year? 1 0 0 0 1  Number falls in past yr: 0 0 0 0 1  Injury with Fall? 1 0 0 0 0  Risk for fall due to : History of fall(s) No Fall Risks No Fall Risks No Fall Risks History of fall(s)  Follow up Falls evaluation completed;Falls prevention discussed Falls evaluation completed Falls evaluation completed Falls evaluation completed Falls evaluation completed;Falls prevention discussed    MEDICARE RISK AT HOME:  Medicare Risk at Home Any stairs in or around the home?: Yes If so,  are there any without handrails?: No Home free of loose throw rugs in walkways, pet beds, electrical cords, etc?: Yes Adequate lighting in your home to reduce risk of falls?: Yes Life alert?: No Use of a cane, walker or w/c?: No Grab bars in the bathroom?: Yes Shower chair or bench in shower?: Yes Elevated toilet seat or a handicapped toilet?: No  TIMED UP AND GO:  Was the test performed?  No  Cognitive Function: 6CIT completed        09/13/2023   11:19 AM 08/29/2022   10:33 AM 08/19/2021    9:48 AM  6CIT Screen  What Year? 0 points 0 points 0 points  What month? 0 points 0 points 0 points  What time? 0 points 0 points 0 points  Count back from 20 0 points 0 points 0 points  Months in reverse 0 points 0 points 0 points  Repeat phrase 0 points 0 points 0 points  Total Score 0 points 0 points 0 points    Immunizations Immunization History  Administered Date(s) Administered   Fluad Trivalent(High Dose 65+) 01/17/2023   Hepatitis B, PED/ADOLESCENT 09/23/2014, 06/20/2016, 08/28/2017   Influenza-Unspecified 02/07/2021,  01/28/2022   Novavax(Covid-19) Vaccine 01/26/2023   PFIZER(Purple Top)SARS-COV-2 Vaccination 07/20/2019, 08/12/2019, 02/23/2020   Pfizer Covid-19 Vaccine Bivalent Booster 22yrs & up 03/19/2021, 02/02/2022   Pneumococcal Conjugate-13 01/27/2020   Pneumococcal Polysaccharide-23 03/26/2012, 04/16/2021   Pneumococcal-Unspecified 03/26/2012   RSV,unspecified 05/25/2023   Tdap 03/31/2016   Zoster Recombinant(Shingrix) 03/05/2019, 05/24/2019   Zoster, Live 03/30/2015    Screening Tests Health Maintenance  Topic Date Due   COVID-19 Vaccine (7 - 2024-25 season) 03/23/2023   FOOT EXAM  10/17/2023   INFLUENZA VACCINE  12/01/2023   OPHTHALMOLOGY EXAM  01/10/2024   HEMOGLOBIN A1C  02/21/2024   Diabetic kidney evaluation - Urine ACR  05/18/2024   Diabetic kidney evaluation - eGFR measurement  08/21/2024   Medicare Annual Wellness (AWV)  09/12/2024   MAMMOGRAM  06/05/2025   DTaP/Tdap/Td (2 - Td or Tdap) 03/31/2026   Colonoscopy  09/04/2030   Pneumonia Vaccine 58+ Years old  Completed   DEXA SCAN  Completed   Hepatitis C Screening  Completed   Zoster Vaccines- Shingrix  Completed   HPV VACCINES  Aged Out   Meningococcal B Vaccine  Aged Out    Health Maintenance  Health Maintenance Due  Topic Date Due   COVID-19 Vaccine (7 - 2024-25 season) 03/23/2023   Health Maintenance Items Addressed:declined covid vaccination  Additional Screening:  Vision Screening: Recommended annual ophthalmology exams for early detection of glaucoma and other disorders of the eye.  Dental Screening: Recommended annual dental exams for proper oral hygiene  Community Resource Referral / Chronic Care Management: CRR required this visit?  No   CCM required this visit?  No   Plan:    I have personally reviewed and noted the following in the patient's chart:   Medical and social history Use of alcohol, tobacco or illicit drugs  Current medications and supplements including opioid prescriptions. Patient  is not currently taking opioid prescriptions. Functional ability and status Nutritional status Physical activity Advanced directives List of other physicians Hospitalizations, surgeries, and ER visits in previous 12 months Vitals Screenings to include cognitive, depression, and falls Referrals and appointments  In addition, I have reviewed and discussed with patient certain preventive protocols, quality metrics, and best practice recommendations. A written personalized care plan for preventive services as well as general preventive health recommendations were provided to patient.  Freeda Jerry, [place]   09/13/2023   After Visit Summary: (MyChart) Due to this being a telephonic visit, the after visit summary with patients personalized plan was offered to patient via MyChart   Notes: Nothing significant to report at this time.

## 2023-09-13 NOTE — Therapy (Signed)
 OUTPATIENT PHYSICAL THERAPY SHOULDER    Patient Name: Veronica Galvan MRN: [id number] DOB:07/06/1955, 68 y.o., female Today's Date: 09/13/2023  END OF SESSION:  PT End of Session - 09/13/23 1525     Visit Number 66    Date for PT Re-Evaluation 10/01/23    Authorization Type [employer] [place]    PT Start Time 1525    PT Stop Time 1625    PT Time Calculation (min) 60 min    Activity Tolerance Patient tolerated treatment well    Behavior During Therapy WFL for tasks assessed/performed             Past Medical History:  Diagnosis Date   Allergy     Anxiety    Arthritis    hands   Cataract    Diabetes mellitus without complication (HCC)    type 2   Family history of adverse reaction to anesthesia    son has malignant hyperthermia, daughter does not daughter recently had c section without problems   GERD (gastroesophageal reflux disease)    Headache    sinus   Hyperlipidemia    Hypertension    PONV (postoperative nausea and vomiting)    nausea only   Ulcer, stomach peptic yrs ago   Past Surgical History:  Procedure Laterality Date   APPENDECTOMY     both hells bone spur repair     both heels with metal clips   both shoulder rotator cuff repair     CESAREAN SECTION     x 1   COLONOSCOPY WITH PROPOFOL  N/A 09/07/2016   Procedure: COLONOSCOPY WITH PROPOFOL ;  Surgeon: Garrett Kallman, MD;  Location: [facility];  Service: Endoscopy;  Laterality: N/A;   colonscopy  06/2011   polyps   ELBOW FRACTURE SURGERY Left    EYE SURGERY     FRACTURE SURGERY     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/10/2022   Procedure: REVERSE SHOULDER ARTHROPLASTY;  Surgeon: Ellard Gunning, MD;  Location: [facility];  Service: Orthopedics;  Laterality: Right;  Please follow in room 6 if able   TUBAL LIGATION     VESICO-VAGINAL FISTULA REPAIR     Patient Active Problem List   Diagnosis Date Noted   Gastroesophageal reflux disease 11/16/2021   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 08/18/2021   Dermatofibroma 08/18/2021   History of malignant neoplasm of skin 08/18/2021   Lentigo 08/18/2021   Melanocytic nevi of trunk 08/18/2021   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   Rosacea 08/18/2021   Actinic keratosis 08/18/2021   Sensorineural hearing loss (SNHL) of left ear with unrestricted hearing of right ear 07/26/2021   Tinnitus of left ear 07/26/2021   Acute recurrent maxillary sinusitis 06/22/2021   Primary hypertension 01/12/2021   Hyperlipidemia associated with type 2 diabetes mellitus (HCC) 01/12/2021   Arthritis 01/12/2021   Type II diabetes mellitus (HCC) 11/25/2019   Ingrown toenail 09/05/2018   Neck pain 01/29/2018   Tick bite 10/24/2017    PCP: Ike Malady, [name]  REFERRING PROVIDER: Alfredo Ano, MD  REFERRING DIAG: s/p right reverse TSA  THERAPY DIAG:  Muscle weakness (generalized)  Stiffness of right shoulder, not elsewhere classified  Cervicalgia  Acute pain of right shoulder  Rationale for Evaluation and Treatment: Rehabilitation  ONSET DATE: 11/05/22  SUBJECTIVE:  SUBJECTIVE STATEMENT:  Patient saw sports med MD, got an injection in the left shoulder , She got good relief of the left shoulder pain has an US  scheduled for the right shoulder June 17th.  Reports that the pain in the left is much improved with better ROM, still very painful in the right  PAIN:  Are you having pain? Yes: NPRS scale: 6/10 Pain location: right shoulder, HA Pain description: dull ache at rest, sharp with motions Aggravating factors: quick motions, movements pain up to 10/10 Relieving factors: ice, rest, Tylenol  at best 3/10  PRECAUTIONS: Shoulder Protocol in the chart  RED FLAGS: None   WEIGHT BEARING RESTRICTIONS: No  FALLS:  Has patient fallen in last 6  months? Yes. Number of falls 1  LIVING ENVIRONMENT: Lives with: lives alone Lives in: House/apartment Stairs: No Has following equipment at home: None  OCCUPATION: retired  PLOF: Independent  PATIENT GOALS:dress without difficulty, do hair, have good ROM and less pain  NEXT MD VISIT:   OBJECTIVE:   DIAGNOSTIC FINDINGS:  See above  PATIENT SURVEYS:  FOTO 21  COGNITION: Overall cognitive status: Within functional limits for tasks assessed     SENSATION: WFL  POSTURE: Fwd head, rounded shoulders, elevated and guarded shoulder  UPPER EXTREMITY ROM:   Active ROM Right PROM eval Left AROM  08/31/23 PROM Supine 01/05/23 PROM  01/26/23 PROM 02/09/23 AROM sitting 02/14/23 AROM 02/21/23 AROM  03/02/23 AROM  03/22/23 AAROM 03/28/23 AROM Standing 04/13/23 AROM Standing 05/02/23 AROM  Standing 05/11/23 AROM  Standing 05/25/23 AROM Standing 06/01/23 AROM Standing  07/06/23 AROM  Standing 07/27/23 AROM Standing 08/31/23  Shoulder flexion 35 90 118  123 90 96 95 100 111 110 112 120 125  129 132 130 105  Shoulder extension 5                   Shoulder abduction 20 95 90 92  73 80 83 90 93 90 92 98 100 102  105 95 81  Shoulder adduction                    Shoulder internal rotation 15 35            40 40  42 25  Shoulder external rotation 20 65 45 30  41  45 50 53 56 56 60 66 67 70 70  47  Elbow flexion 120                   Elbow extension 5                   Wrist flexion                    Wrist extension                    Wrist ulnar deviation                    Wrist radial deviation                    Wrist pronation                    Wrist supination                    (Blank rows = not tested)  UPPER EXTREMITY MMT:  No tested due to recent surgery  MMT Right eval Left eval  Shoulder flexion    Shoulder extension    Shoulder abduction    Shoulder adduction    Shoulder internal rotation    Shoulder external rotation    Middle trapezius    Lower trapezius     Elbow flexion    Elbow extension    Wrist flexion    Wrist extension    Wrist ulnar deviation    Wrist radial deviation    Wrist pronation    Wrist supination    Grip strength (lbs)    (Blank rows = not tested)  PALPATION:  Very tight and tender in the pectoral, upper trap, the entire right upper arm   TODAY'S TREATMENT:                                                                                                                                         DATE:  09/13/23 AROM of the left shoulder today was much improved 110 degrees  Walking with encouragement of normal arm swing and no elevation of the shoulders PROM bilateral shoulders STM to the right upper arm, right upper trap and tight neck Vaso medium pressure  09/07/23 Reviewed US  of the left shoulder and x-rays as patient had questions PROM bilateral shoulders STM to the upper traps, rhomboids, cervical area Vaso to bilateral shoulders medium pressure   09/05/23 Nustep level 3 x 5 minutes UBE level 2 x 3 minutes US  to the left shoulder/upper arm STM to the neck and upper traps Passive stretch shoulders She then went to OT and after I put her on the VASO  08/31/23 Discussion as noted below, measured both shoulder AROM in standing as noted above STM to the right upper arm and shoulder Passive stretch of the right shoulder to her point of pain Vaso bilateral shoulders for pain medium pressure   08/03/23 Pateint had a lot of questions regarding the bloodwork and the bone scan and what they are looking for, I strongly advised her to follow the MD/PA orders to wear sling and not use the arm, I did my best to answer her questions.  Finished with Vaso medium pressure 34 degrees  07/27/23 Gentle shoulder distractions Gentle PROM to her tolerance, a lot of cues to relax Neural tension stretches of the right UE STM to the upper trap, neck and rhomboid STM to the biceps, deltoid and pectoral area  07/25/23 UBE level 3 x 4  minutes for mm perfusion Lats 15# too painful Seated row 10# Doorway stretch PROM right shoulder all motions to tolerance STM to the tight upper arm and the right pectoral area  07/20/23 UBE level 3 x 4 minutes Ball vs wall Rolling ball up wall Small weighted ball toss 2 ways and then with her doing ER Passive stretch right shoulder STM to the right shoulder and upper arm  07/18/23 Passive stretch right shoulder Gentle joint mobs  UE neural tension  stretches STM to the right pectoral, right biceps, deltoid area Wall slides Wall circles Doorway stretch  PATIENT EDUCATION: Education details: poc/hep Person educated: Patient Education method: Explanation, Facilities manager, Actor cues, Verbal cues, and Handouts Education comprehension: verbalized understanding  HOME EXERCISE PROGRAM: Access Code: [email or web address] URL: [email or web address] Date: 12/20/2022 Prepared by: Cherylene Corrente  Exercises - Seated Shoulder Flexion Towel Slide at Table Top  - 2 x daily - 7 x weekly - 2 sets - 10 reps - 3 hold - Seated Shoulder External Rotation PROM on Table  - 2 x daily - 7 x weekly - 2 sets - 10 reps - 3 hold - Seated Elbow Extension and Shoulder External Rotation AAROM at Table with Towel  - 2 x daily - 7 x weekly - 2 sets - 10 reps - 3 hold - Circular Shoulder Pendulum with Table Support  - 2 x daily - 7 x weekly - 2 sets - 10 reps - 3 hold  Access Code: [email or web address] URL: [email or web address] Date: 07/13/2023 Prepared by: Cherylene Corrente  Exercises - Isometric Shoulder Flexion at Wall  - 1 x daily - 7 x weekly - 1 sets - 10 reps - 3 hold - Standing Isometric Shoulder Internal Rotation at Doorway  - 1 x daily - 7 x weekly - 1 sets - 10 reps - 3 hold - Isometric Shoulder Extension at Wall  - 1 x daily - 7 x weekly - 1 sets - 10 reps - 3 hold - Isometric Shoulder Abduction at Wall  - 1 x daily - 7 x weekly - 1 sets - 10 reps - 3 hold - Standing Isometric Shoulder  External Rotation with Doorway  - 1 x daily - 7 x weekly - 1 sets - 10 reps - 3 hold  ASSESSMENT:  CLINICAL IMPRESSION:  Light exercise, ligth stretch and focus today was on STM to the neck and upper traps as she was very tight and very sore, MD Alease Hunter).  Feels that the left shoulder is bursitis, tendonitis, with impingement.  He did send an order for PT to evaluate and treat a He did feel that the right shoulder needs to be assessed  with the US   Patient saw surgeon, the bone scan said did not feel that there was infection or loosening however the surgeon is not so sure, he wants to do an US  to be sure, that is scheduled for the next few weeks, since she has not been to PT in a month I re-measured her ROM, she has lost at least 15 degrees of motion and more for all motions.  She is also having more pain in the left shoulder possibly from overdoing it, because she is trying to not use the right shoulder.  She has talked about this with her primary and I suggested that she see a sports medicine MD, she called while she was here and will have appointment with him next week.  As far as the right shoulder the Surgeon stated in his notes to resume formal PT on a conservative basis to help with ROM and pain until the US  is performed   OBJECTIVE IMPAIRMENTS: cardiopulmonary status limiting activity, decreased activity tolerance, decreased endurance, decreased ROM, decreased strength, increased edema, increased muscle spasms, impaired flexibility, impaired UE functional use, improper body mechanics, postural dysfunction, and pain.  REHAB POTENTIAL: Good  CLINICAL DECISION MAKING: Evolving/moderate complexity  EVALUATION COMPLEXITY: Low   GOALS: Goals reviewed with patient? Yes  SHORT TERM GOALS: Target date: 01/01/23  Independent  with initial HEP Goal status: 12/27/22 MET  LONG TERM GOALS: Target date: 03/22/23  Decrease pain 50% Goal status: progressing 07/25/23  2.  Dress without  difficulty Goal status: progressing 07/25/23  3.  Do hair without difficulty Goal status: able to reach the top of head at times progressing 2/20/253/25/25 4.  Increase AROM right shoulder flexion to 130 degrees Goal status: 07/25/23 progressing 130 degrees with pain  5.  Increase right shoulder ER to 60 degrees Goal status: met 07/06/23  6.  Return to water  aerobics and or gym activity Goal status:progressing met 06/01/23  PLAN:  PT FREQUENCY: 1-2x/week  PT DURATION: 12 weeks  PLANNED INTERVENTIONS: Therapeutic exercises, Therapeutic activity, Neuromuscular re-education, Balance training, Gait training, Patient/Family education, Self Care, Joint mobilization, Dry Needling, Electrical stimulation, Cryotherapy, Vasopneumatic device, and Manual therapy  PLAN FOR NEXT SESSION:   will resume PT on the right shoulder conservatively to try to get back lost ROM and help with pain until she has US , will evaluate left shoulder soon 09/13/2023, 3:26 PM [facility] at [facility] [address] [facility] [place], [place], [address] Phone: [phone]   Fax:  [phone]

## 2023-09-13 NOTE — Patient Instructions (Signed)
 Ms. Veronica Galvan , Thank you for taking time out of your busy schedule to complete your Annual Wellness Visit with me. I enjoyed our conversation and look forward to speaking with you again next year. I, as well as your care team,  appreciate your ongoing commitment to your health goals. Please review the following plan we discussed and let me know if I can assist you in the future. Your Game plan/ To Do List    Referrals: If you haven't heard from the office you've been referred to, please reach out to them at the phone provided.   Follow up Visits: Next Medicare AWV with our clinical staff: 09/18/2024   Have you seen your provider in the last 6 months (3 months if uncontrolled diabetes)? Yes Next Office Visit with your provider: 12/19/2023  Clinician Recommendations:  Aim for 30 minutes of exercise or brisk walking, 6-8 glasses of water , and 5 servings of fruits and vegetables each day.       This is a list of the screening recommended for you and due dates:  Health Maintenance  Topic Date Due   COVID-19 Vaccine (7 - 2024-25 season) 03/23/2023   Complete foot exam   10/17/2023   Flu Shot  12/01/2023   Eye exam for diabetics  01/10/2024   Hemoglobin A1C  02/21/2024   Yearly kidney health urinalysis for diabetes  05/18/2024   Yearly kidney function blood test for diabetes  08/21/2024   Medicare Annual Wellness Visit  09/12/2024   Mammogram  06/05/2025   DTaP/Tdap/Td vaccine (2 - Td or Tdap) 03/31/2026   Colon Cancer Screening  09/04/2030   Pneumonia Vaccine  Completed   DEXA scan (bone density measurement)  Completed   Hepatitis C Screening  Completed   Zoster (Shingles) Vaccine  Completed   HPV Vaccine  Aged Out   Meningitis B Vaccine  Aged Out    Advanced directives: (Declined) Advance directive discussed with you today. Even though you declined this today, please call our office should you change your mind, and we can give you the proper paperwork for you to fill out. Advance Care  Planning is important because it:  [x]  Makes sure you receive the medical care that is consistent with your values, goals, and preferences  [x]  It provides guidance to your family and loved ones and reduces their decisional burden about whether or not they are making the right decisions based on your wishes.  Follow the link provided in your after visit summary or read over the paperwork we have mailed to you to help you started getting your Advance Directives in place. If you need assistance in completing these, please reach out to us  so that we can help you!  See attachments for Preventive Care and Fall Prevention Tips.

## 2023-09-18 ENCOUNTER — Encounter: Payer: Self-pay | Admitting: Occupational Therapy

## 2023-09-18 ENCOUNTER — Telehealth: Payer: Self-pay | Admitting: Family Medicine

## 2023-09-18 NOTE — Telephone Encounter (Signed)
 I do not see that we placed referral to [facility], only [facility] for PT.   Pt has been seen by Dr. Alfredo Ano at [facility] in the past.

## 2023-09-18 NOTE — Telephone Encounter (Addendum)
 Melissa from [facility] called and was confirming demographic information for a referral she received from us . I do not see that a referral was sent to [facility] so I let her know I would check and we would give a call back. Can you confirm that she was referred to [facility]?

## 2023-09-19 ENCOUNTER — Other Ambulatory Visit: Payer: Self-pay

## 2023-09-19 ENCOUNTER — Encounter: Payer: Self-pay | Admitting: Occupational Therapy

## 2023-09-19 ENCOUNTER — Encounter: Payer: Self-pay | Admitting: Physical Therapy

## 2023-09-19 ENCOUNTER — Ambulatory Visit: Admitting: Physical Therapy

## 2023-09-19 ENCOUNTER — Ambulatory Visit: Admitting: Occupational Therapy

## 2023-09-19 DIAGNOSIS — M25641 Stiffness of right hand, not elsewhere classified: Secondary | ICD-10-CM

## 2023-09-19 DIAGNOSIS — M6281 Muscle weakness (generalized): Secondary | ICD-10-CM

## 2023-09-19 DIAGNOSIS — M25642 Stiffness of left hand, not elsewhere classified: Secondary | ICD-10-CM | POA: Diagnosis not present

## 2023-09-19 DIAGNOSIS — M79642 Pain in left hand: Secondary | ICD-10-CM | POA: Diagnosis not present

## 2023-09-19 DIAGNOSIS — M25511 Pain in right shoulder: Secondary | ICD-10-CM | POA: Diagnosis not present

## 2023-09-19 DIAGNOSIS — R252 Cramp and spasm: Secondary | ICD-10-CM | POA: Diagnosis not present

## 2023-09-19 DIAGNOSIS — G8929 Other chronic pain: Secondary | ICD-10-CM | POA: Diagnosis not present

## 2023-09-19 DIAGNOSIS — M79641 Pain in right hand: Secondary | ICD-10-CM | POA: Diagnosis not present

## 2023-09-19 DIAGNOSIS — M25611 Stiffness of right shoulder, not elsewhere classified: Secondary | ICD-10-CM

## 2023-09-19 DIAGNOSIS — M542 Cervicalgia: Secondary | ICD-10-CM

## 2023-09-19 DIAGNOSIS — R600 Localized edema: Secondary | ICD-10-CM | POA: Diagnosis not present

## 2023-09-19 DIAGNOSIS — R278 Other lack of coordination: Secondary | ICD-10-CM | POA: Diagnosis not present

## 2023-09-19 NOTE — Therapy (Signed)
 OUTPATIENT OCCUPATIONAL THERAPY ORTHO Treatment  Patient Name: JANICE BODINE MRN: [id number] DOB:10-03-1955, 68 y.o., female Today's Date: 09/19/2023  PCP: Adra Alanis [name] REFERRING PROVIDER: Adra Alanis [name]  END OF SESSION:  OT End of Session - 09/19/23 0930     Visit Number 24    Number of Visits 41    Date for OT Re-Evaluation 11/07/23    Authorization Type [employer] MCR    Authorization Time Period 12 weeks    Authorization - Visit Number 24    Progress Note Due on Visit 26    OT Start Time [phone]    OT Stop Time 0931    OT Time Calculation (min) 45 min    Activity Tolerance Patient tolerated treatment well    Behavior During Therapy WFL for tasks assessed/performed                       Past Medical History:  Diagnosis Date   Allergy     Anxiety    Arthritis    hands   Cataract    Diabetes mellitus without complication (HCC)    type 2   Family history of adverse reaction to anesthesia    son has malignant hyperthermia, daughter does not daughter recently had c section without problems   GERD (gastroesophageal reflux disease)    Headache    sinus   Hyperlipidemia    Hypertension    PONV (postoperative nausea and vomiting)    nausea only   Ulcer, stomach peptic yrs ago   Past Surgical History:  Procedure Laterality Date   APPENDECTOMY     both hells bone spur repair     both heels with metal clips   both shoulder rotator cuff repair     CESAREAN SECTION     x 1   COLONOSCOPY WITH PROPOFOL  N/A 09/07/2016   Procedure: COLONOSCOPY WITH PROPOFOL ;  Surgeon: Garrett Kallman, MD;  Location: [facility];  Service: Endoscopy;  Laterality: N/A;   colonscopy  06/2011   polyps   ELBOW FRACTURE SURGERY Left    EYE SURGERY     FRACTURE SURGERY     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/10/2022   Procedure: REVERSE SHOULDER ARTHROPLASTY;  Surgeon: Ellard Gunning, MD;  Location: [facility];  Service: Orthopedics;  Laterality: Right;   Please follow in room 6 if able   TUBAL LIGATION     VESICO-VAGINAL FISTULA REPAIR     Patient Active Problem List   Diagnosis Date Noted   Gastroesophageal reflux disease 11/16/2021   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 08/18/2021   Dermatofibroma 08/18/2021   History of malignant neoplasm of skin 08/18/2021   Lentigo 08/18/2021   Melanocytic nevi of trunk 08/18/2021   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   Rosacea 08/18/2021   Actinic keratosis 08/18/2021   Sensorineural hearing loss (SNHL) of left ear with unrestricted hearing of right ear 07/26/2021   Tinnitus of left ear 07/26/2021   Acute recurrent maxillary sinusitis 06/22/2021   Primary hypertension 01/12/2021   Hyperlipidemia associated with type 2 diabetes mellitus (HCC) 01/12/2021   Arthritis 01/12/2021   Type II diabetes mellitus (HCC) 11/25/2019   Ingrown toenail 09/05/2018   Neck pain 01/29/2018   Tick bite 10/24/2017    ONSET DATE: 05/19/23  REFERRING DIAG:  Diagnosis  M79.641,M79.642 (ICD-10-CM) - Bilateral hand pain    THERAPY DIAG:  No diagnosis found.  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 Yes: NPRS scale: /10- right hand Pain location: right hand,  3/10 left hand Pain description: aching, stiff Aggravating factors: gripping  Relieving factors: heat, meds Pt also has shoulder pain which is more significant grossly R shoulder 8/10 L shoulder pain 5/10 which OT will not address. Pt is on hold from PT pending further testing  FALLS: Has patient  fallen in last 6 months? No  LIVING ENVIRONMENT: Lives with: lives alone Lives in: House/apartment Stairs: yes   PLOF: Independent  PATIENT GOALS: improve functional use of hands and learn adapted strategies for ADLs/IADLs.  NEXT MD VISIT: unknown  OBJECTIVE:  Note: Objective measures were completed at Evaluation unless otherwise noted.  HAND DOMINANCE: Right  ADLs: Overall ADLs: unable to grip credit card to remove from ATM Transfers/ambulation related to ADLs: mod I Eating: drops silverware Grooming: drops toothbrush, uses electric toothbrush, holding comb Upper body dressing: adjusting bra Lower body dressing: difficulty pulling up pants  Toileting: hygeine was difficult initally Bathing: mod I, difficult with shaving Tub shower transfers: walk in shower    FUNCTIONAL OUTCOME MEASURES: Quick Dash: 05/30/23- 75%% disability Quick Dash 08/15/23- 70.5% disability   UPPER EXTREMITY ROM:   RUE wrist flexion/ extension: 70/ 50, LUE wrist flexion/ extension: 70/ 45 Pt demonstrates grossly 50% composite flexion for right and 555 with left.   Active ROM Right eval Left eval  Thumb MCP (0-60) 45 55  Thumb IP (0-80) 25 10  Thumb Radial abd/add (0-55)     Thumb Palmar abd/add (0-45)     Thumb Opposition to Small Finger yes yes   Index MCP (0-90)     Index PIP (0-100)     Index DIP (0-70)      Long MCP (0-90)      Long PIP (0-100)      Long DIP (0-70)      Ring MCP (0-90)      Ring PIP (0-100)      Ring DIP (0-70)      Little MCP (0-90)      Little PIP (0-100)      Little DIP (0-70)      (Blank rows = not tested) 9 hole peg test (08/15/23) RUE 26.70 secs, LUE 23 secs  HAND FUNCTION: Grip strength: Right: 8 lbs; Left: 4 lbs,          08/15/23-  RUE 18, LUE 14 lbs,           08/15/23   Pinch: RUE tip 4lbs, lateral 8 lbs, LUE tip 3 lbs, lateral 8 lbs   SENSATION: Light touch: Impaired index finger LUE  EDEMA: Pt with bony defomities at PIP joints of all digits  which pt reports are bone spurs, Pt also has bony deformity at DIP for right thumb, index and 5th digit and LUE small and index fingers  COGNITION: Overall cognitive status: Within functional limits for tasks assessed   OBSERVATIONS: Pleasant female desires to increase functional use of bilateral UE's  , TREATMENT DATE:5/20/25Paraffin to RUE x 10 mins, no adverse reactions, while therapist performed US  to LUE 3.3 mhz, 0.8 w/cm 2,  20%x 8 mins no adverse  reactions for stiffness. Paraffin to LUE x 10 mins, no adverse reactions, while therapist performed US  to RUE 3.3 mhz, 0.8 w/cm 2,  20%x 8 mins no adverse reactions for stiffness.  Passive composite finger flexion, and individual finger flexion with exception of right middle finger due to pain. Place and holds perfromed. Composite finger flexion/ extension with washcloth  x 10 reps followed by followed by resistive gripping of squeeze ball, then individual pinch with each digit for LUE.  09/11/23-Paraffin to RUE x 10 mins, no adverse reactions, while therapist performed US  to LUE 3.3 mhz, 0.8 w/cm 2,  20%x 8 mins no adverse reactions for stiffness. Paraffin to LUE x 10 mins, no adverse reactions, while therapist performed US  to RUE 3.3 mhz, 0.8 w/cm 2,  20%x 8 mins no adverse reactions for stiffness.  P/ROM to RUE PIP joints individually with exception of right middle finger then compositely in flexion followed by passive stretch to digits of LUE individually in composite flexion then all digits together for passive composite finger flexion. A/ROM individual finger extension for bilateral UE's. Reviewed yellow theraputty HEP for sustained grip and tip pinch bilateral UE's with exception of right middle finger.  09/07/23-Paraffin to RUE x 10 mins, no adverse reactions, while therapist performed US  to LUE 3.3 mhz, 0.8 w/cm 2,  20%x 8 mins no adverse reactions for stiffness. Paraffin to LUE x 10 mins, no adverse reactions, while therapist performed US   to RUE 3.3 mhz, 0.8 w/cm 2,  20%x 8 mins no adverse reactions for stiffness.  P/ROM to RUE PIP joints individually then compositely in flexion followed by passive stretch to digits of LUE individually in composite flexion then all digits together for passive composite finger flexion. A/ROM individual finger extension for bilateral UE's Washcloth scrunches and flattening with bilateral UE's min v.c  composite and individual finger flexion with resistive squeeze ball(tree) 10-20 reps each bilateral UE's   09/05/23-Paraffin to RUE x 10 mins, no adverse reactions, while therapist performed US  to LUE 3.3 mhz, 0.8 w/cm 2,  20%x 8 mins no adverse reactions for stiffness. Paraffin to LUE x 10 mins, no adverse reactions, while therapist performed US  to RUE 3.3 mhz, 0.8 w/cm 2,  20%x 8 mins no adverse reactions for stiffness.  P/ROM to RUE PIP joints individually then compositely in flexion followed by passive stretch to digits of LUE individually in composite flexion then all digits together for passive composite finger flexion. A/ROM individual finger extension for bilateral UE's, then digiflex light resistance for individual and composite flexion. Pt transitioned to composite flexion with resistive squeeze ball due to discomfort with digit flex for RUE.   PATIENT EDUCATION: Education details:   see above Person educated: Patient Education method: Explanation, demonstration, v.c Education comprehension: verbalized understanding, returned demonstration  HOME EXERCISE PROGRAM: AROM yellow putty  GOALS: Goals reviewed with patient? Yes  SHORT TERM GOALS: Target date: 09/14/23  I with HEP  Goal status:  met 06/07/23  2.  I with positioning/ splinting prn to minimize pain and defomity   Goal status: met, 07/13/23  3.  Pt will increase bilateral grip strength by 5 lbs for increased UE functional use. Upgraded goal Pt will increase bilateral grip strength to at least 25 lbs (after  stretching). Baseline: 05/30/23- RUE 8 lbs, LUE 4 lbs Goal status:met 08/01/23-R 20 lbs, L  19 lbs,  08/15/23- RUE 18 lbsLUE 14 lbs continue to address upgraded goal.  4. Pt will demonstrate improved RUE fine motor coordiantion as evidenced by  perfroming 9 hole peg test in 23 secs or less.  Goal status: ongoing 09/19/23 5. I with updates to HEP.  Goal status: ongoing 09/19/23 6. Pt will demonstrate improved composite finger flexion for ADLs as evidenced by pt. bringing middle fingertip for RUE within 1/2 an inch of palm.  Baseline: 3/4 inch from palm to middle fingertip  Goal status : ongoing, 3/4 inch 09/19/23 7.Pt will demonstrate improved composite finger flexion for ADLs as evidenced by pt. bringing middle fingertip for LUE within 3/4 an inch of palm.  Baseline: 1 inch from palm to middle finger tip  Goal status: met, 3/4 inch away from middle finger 09/19/23  LONG TERM GOALS: Target date:11/07/23  I with updated HEP  Goal status:met 08/15/22  2.  Pt will improve Quick Dash score to 70% disability Baseline: 75% (05/30/23) Goal status:   improved - however not fully met due to complications from shoulder pain-70.5%08/15/23  3.  Pt will demonstrate at least 65% composte flexion for LUE for increased functional use.  Goal status: met 65-70% , 08/15/23  4.  I with adapted strategies/ adapted equipment to minimize pain and to increase pt I with ADLs/IADLs  Goal status:  met for inital strategies, however continue goal as pt may report additional tasks that can benefit from modification.08/15/23  5.  Pt will demonstrate at least 65% composite flexion for RUE for increased functional use.  Goal status: met 70% 08/15/23  6. Pt will report that she is able to cut her meat such as steak or chicken modified independently.  [place]: unable  Goal status: ongoing 09/19/23    7. Pt will report increased ease with opening/ closing ziplock bags.    Goal status: ongoing    8. Pt will improve bilateral  tip pinch by 2 lbs for increased ease with daily activities.     Goal status: ongoing 09/19/23    9. Pt will improve bilateral lateral pinch by 2 lbs for increased ease with ADLs.    Goal status: ongoing 09/19/23   ASSESSMENT: CLINICAL IMPRESSION: Pt is progressing towards goals. She met short term goal # 7 and demonstrates decreased overall pain.PERFORMANCE DEFICITS: in functional skills including ADLs, IADLs, coordination, sensation, edema, ROM, strength, pain, flexibility, Fine motor control, Gross motor control, endurance, decreased knowledge of precautions, decreased knowledge of use of DME, and UE functional use,, and psychosocial skills including coping strategies, environmental adaptation, habits, interpersonal interactions, and routines and behaviors.   IMPAIRMENTS: are limiting patient from ADLs, IADLs, rest and sleep, education, play, leisure, and social participation.   COMORBIDITIES: may have co-morbidities  that affects occupational performance. Patient will benefit from skilled OT to address above impairments and improve overall function.  MODIFICATION OR ASSISTANCE TO COMPLETE EVALUATION: No modification of tasks or assist necessary to complete an evaluation.  OT OCCUPATIONAL PROFILE AND HISTORY: Detailed assessment: Review of records and additional review of physical, cognitive, psychosocial history related to current functional performance.  CLINICAL DECISION MAKING: LOW - limited treatment options, no task modification necessary  REHAB POTENTIAL: Good  EVALUATION COMPLEXITY: Low      PLAN:  OT FREQUENCY: 2x/week  OT DURATION: 12 weeks( anticipate d/c after 8 weeks dependent on progress)  PLANNED INTERVENTIONS: 29562 OT Re-evaluation, 97535 self care/ADL training, 13086 therapeutic exercise, 97530 therapeutic activity, 97112 neuromuscular re-education, 97140 manual therapy, 97113 aquatic therapy, 97035 ultrasound, 97018 paraffin, 57846 fluidotherapy, 97010 moist  heat, 97010 cryotherapy, 97034 contrast bath, 97760 Orthotics management and training, 96295 Splinting (initial encounter), [id number] Subsequent  splinting/medication, passive range of motion, energy conservation, coping strategies training, patient/family education, and DME and/or AE instructions  RECOMMENDED OTHER SERVICES: none  CONSULTED AND AGREED WITH PLAN OF CARE: Patient  PLAN FOR NEXT SESSION:  opening zip lock bags, check grip strength   Jerimyah Vandunk, OT 09/19/2023, 9:30 AM

## 2023-09-19 NOTE — Therapy (Signed)
 OUTPATIENT PHYSICAL THERAPY SHOULDER    Patient Name: Veronica Galvan MRN: [id number] DOB:1955/06/23, 68 y.o., female Today's Date: 09/19/2023  END OF SESSION:  PT End of Session - 09/19/23 1314     Visit Number 67    Date for PT Re-Evaluation 10/01/23    Authorization Type [employer] [place]    PT Start Time 1311    PT Stop Time 1415    PT Time Calculation (min) 64 min    Activity Tolerance Patient tolerated treatment well    Behavior During Therapy WFL for tasks assessed/performed             Past Medical History:  Diagnosis Date   Allergy     Anxiety    Arthritis    hands   Cataract    Diabetes mellitus without complication (HCC)    type 2   Family history of adverse reaction to anesthesia    son has malignant hyperthermia, daughter does not daughter recently had c section without problems   GERD (gastroesophageal reflux disease)    Headache    sinus   Hyperlipidemia    Hypertension    PONV (postoperative nausea and vomiting)    nausea only   Ulcer, stomach peptic yrs ago   Past Surgical History:  Procedure Laterality Date   APPENDECTOMY     both hells bone spur repair     both heels with metal clips   both shoulder rotator cuff repair     CESAREAN SECTION     x 1   COLONOSCOPY WITH PROPOFOL  N/A 09/07/2016   Procedure: COLONOSCOPY WITH PROPOFOL ;  Surgeon: Garrett Kallman, MD;  Location: [facility];  Service: Endoscopy;  Laterality: N/A;   colonscopy  06/2011   polyps   ELBOW FRACTURE SURGERY Left    EYE SURGERY     FRACTURE SURGERY     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/10/2022   Procedure: REVERSE SHOULDER ARTHROPLASTY;  Surgeon: Ellard Gunning, MD;  Location: [facility];  Service: Orthopedics;  Laterality: Right;  Please follow in room 6 if able   TUBAL LIGATION     VESICO-VAGINAL FISTULA REPAIR     Patient Active Problem List   Diagnosis Date Noted   Gastroesophageal reflux disease 11/16/2021   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 08/18/2021   Dermatofibroma 08/18/2021   History of malignant neoplasm of skin 08/18/2021   Lentigo 08/18/2021   Melanocytic nevi of trunk 08/18/2021   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   Rosacea 08/18/2021   Actinic keratosis 08/18/2021   Sensorineural hearing loss (SNHL) of left ear with unrestricted hearing of right ear 07/26/2021   Tinnitus of left ear 07/26/2021   Acute recurrent maxillary sinusitis 06/22/2021   Primary hypertension 01/12/2021   Hyperlipidemia associated with type 2 diabetes mellitus (HCC) 01/12/2021   Arthritis 01/12/2021   Type II diabetes mellitus (HCC) 11/25/2019   Ingrown toenail 09/05/2018   Neck pain 01/29/2018   Tick bite 10/24/2017    PCP: Ike Malady, [name]  REFERRING PROVIDER: Alfredo Ano, MD  REFERRING DIAG: s/p right reverse TSA  THERAPY DIAG:  Muscle weakness (generalized)  Stiffness of right shoulder, not elsewhere classified  Cervicalgia  Acute pain of right shoulder  Rationale for Evaluation and Treatment: Rehabilitation  ONSET DATE: 11/05/22  SUBJECTIVE:  SUBJECTIVE STATEMENT:   Patient was out of town over the past 5 days, did a lot of walking, reports that she did go back in her sling to decrease the stress on the right shoulder, she does report that she is very sore and stiff with tightness, Patient saw sports med MD, got an injection in the left shoulder , She got good relief of the left shoulder pain has an US  scheduled for the right shoulder June 17th.  Reports that the pain in the left is much improved with better ROM, still very painful in the right  PAIN:  Are you having pain? Yes: NPRS scale: 6/10 Pain location: right shoulder, HA Pain description: dull ache at rest, sharp with motions Aggravating factors: quick motions,  movements pain up to 10/10 Relieving factors: ice, rest, Tylenol  at best 3/10  PRECAUTIONS: Shoulder Protocol in the chart  RED FLAGS: None   WEIGHT BEARING RESTRICTIONS: No  FALLS:  Has patient fallen in last 6 months? Yes. Number of falls 1  LIVING ENVIRONMENT: Lives with: lives alone Lives in: House/apartment Stairs: No Has following equipment at home: None  OCCUPATION: retired  PLOF: Independent  PATIENT GOALS:dress without difficulty, do hair, have good ROM and less pain  NEXT MD VISIT:   OBJECTIVE:   DIAGNOSTIC FINDINGS:  See above  PATIENT SURVEYS:  FOTO 21  COGNITION: Overall cognitive status: Within functional limits for tasks assessed     SENSATION: WFL  POSTURE: Fwd head, rounded shoulders, elevated and guarded shoulder  UPPER EXTREMITY ROM:   Active ROM Right PROM eval Left AROM  08/31/23 PROM Supine 01/05/23 PROM  01/26/23 PROM 02/09/23 AROM sitting 02/14/23 AROM 02/21/23 AROM  03/02/23 AROM  03/22/23 AAROM 03/28/23 AROM Standing 04/13/23 AROM Standing 05/02/23 AROM  Standing 05/11/23 AROM  Standing 05/25/23 AROM Standing 06/01/23 AROM Standing  07/06/23 AROM  Standing 07/27/23 AROM Standing 08/31/23  Shoulder flexion 35 90 118  123 90 96 95 100 111 110 112 120 125  129 132 130 105  Shoulder extension 5                   Shoulder abduction 20 95 90 92  73 80 83 90 93 90 92 98 100 102  105 95 81  Shoulder adduction                    Shoulder internal rotation 15 35            40 40  42 25  Shoulder external rotation 20 65 45 30  41  45 50 53 56 56 60 66 67 70 70  47  Elbow flexion 120                   Elbow extension 5                   Wrist flexion                    Wrist extension                    Wrist ulnar deviation                    Wrist radial deviation                    Wrist pronation  Wrist supination                    (Blank rows = not tested)  UPPER EXTREMITY MMT:  No tested due to recent  surgery  MMT Right eval Left eval  Shoulder flexion    Shoulder extension    Shoulder abduction    Shoulder adduction    Shoulder internal rotation    Shoulder external rotation    Middle trapezius    Lower trapezius    Elbow flexion    Elbow extension    Wrist flexion    Wrist extension    Wrist ulnar deviation    Wrist radial deviation    Wrist pronation    Wrist supination    Grip strength (lbs)    (Blank rows = not tested)  PALPATION:  Very tight and tender in the pectoral, upper trap, the entire right upper arm   TODAY'S TREATMENT:                                                                                                                                         DATE:  09/19/23 Passive stretch of the right and left shoulder STM to the upper traps, neck, rhomboids and upper arms Vaso to the right shoulder  09/13/23 AROM of the left shoulder today was much improved 110 degrees  Walking with encouragement of normal arm swing and no elevation of the shoulders PROM bilateral shoulders STM to the right upper arm, right upper trap and tight neck Vaso medium pressure  09/07/23 Reviewed US  of the left shoulder and x-rays as patient had questions PROM bilateral shoulders STM to the upper traps, rhomboids, cervical area Vaso to bilateral shoulders medium pressure   09/05/23 Nustep level 3 x 5 minutes UBE level 2 x 3 minutes US  to the left shoulder/upper arm STM to the neck and upper traps Passive stretch shoulders She then went to OT and after I put her on the VASO  08/31/23 Discussion as noted below, measured both shoulder AROM in standing as noted above STM to the right upper arm and shoulder Passive stretch of the right shoulder to her point of pain Vaso bilateral shoulders for pain medium pressure   08/03/23 Pateint had a lot of questions regarding the bloodwork and the bone scan and what they are looking for, I strongly advised her to follow the MD/PA orders to  wear sling and not use the arm, I did my best to answer her questions.  Finished with Vaso medium pressure 34 degrees  07/27/23 Gentle shoulder distractions Gentle PROM to her tolerance, a lot of cues to relax Neural tension stretches of the right UE STM to the upper trap, neck and rhomboid STM to the biceps, deltoid and pectoral area  07/25/23 UBE level 3 x 4 minutes for mm perfusion Lats 15# too painful Seated row 10# Doorway stretch PROM  right shoulder all motions to tolerance STM to the tight upper arm and the right pectoral area  07/20/23 UBE level 3 x 4 minutes Ball vs wall Rolling ball up wall Small weighted ball toss 2 ways and then with her doing ER Passive stretch right shoulder STM to the right shoulder and upper arm  07/18/23 Passive stretch right shoulder Gentle joint mobs  UE neural tension stretches STM to the right pectoral, right biceps, deltoid area Wall slides Wall circles Doorway stretch  PATIENT EDUCATION: Education details: poc/hep Person educated: Patient Education method: Programmer, multimedia, Facilities manager, Actor cues, Verbal cues, and Handouts Education comprehension: verbalized understanding  HOME EXERCISE PROGRAM: Access Code: [email or web address] URL: [email or web address] Date: 12/20/2022 Prepared by: Cherylene Corrente  Exercises - Seated Shoulder Flexion Towel Slide at Table Top  - 2 x daily - 7 x weekly - 2 sets - 10 reps - 3 hold - Seated Shoulder External Rotation PROM on Table  - 2 x daily - 7 x weekly - 2 sets - 10 reps - 3 hold - Seated Elbow Extension and Shoulder External Rotation AAROM at Table with Towel  - 2 x daily - 7 x weekly - 2 sets - 10 reps - 3 hold - Circular Shoulder Pendulum with Table Support  - 2 x daily - 7 x weekly - 2 sets - 10 reps - 3 hold  Access Code: [email or web address] URL: [email or web address] Date: 07/13/2023 Prepared by: Cherylene Corrente  Exercises - Isometric Shoulder Flexion at Wall  - 1 x daily -  7 x weekly - 1 sets - 10 reps - 3 hold - Standing Isometric Shoulder Internal Rotation at Doorway  - 1 x daily - 7 x weekly - 1 sets - 10 reps - 3 hold - Isometric Shoulder Extension at Wall  - 1 x daily - 7 x weekly - 1 sets - 10 reps - 3 hold - Isometric Shoulder Abduction at Wall  - 1 x daily - 7 x weekly - 1 sets - 10 reps - 3 hold - Standing Isometric Shoulder External Rotation with Doorway  - 1 x daily - 7 x weekly - 1 sets - 10 reps - 3 hold  ASSESSMENT:  CLINICAL IMPRESSION:  Patient has been traveling very stiff and sore, very tight, worked mostly STM and some stretching, still very painful with right ER and flexion.  He did send an order for PT to evaluate and treat a He did feel that the right shoulder needs to be assessed  with the US   Patient saw surgeon, the bone scan said did not feel that there was infection or loosening however the surgeon is not so sure, he wants to do an US  to be sure, that is scheduled for the next few weeks, since she has not been to PT in a month I re-measured her ROM, she has lost at least 15 degrees of motion and more for all motions.  She is also having more pain in the left shoulder possibly from overdoing it, because she is trying to not use the right shoulder.  She has talked about this with her primary and I suggested that she see a sports medicine MD, she called while she was here and will have appointment with him next week.  As far as the right shoulder the Surgeon stated in his notes to resume formal PT on a conservative basis to help with ROM and pain until the US  is performed   OBJECTIVE IMPAIRMENTS: cardiopulmonary status limiting activity, decreased activity  tolerance, decreased endurance, decreased ROM, decreased strength, increased edema, increased muscle spasms, impaired flexibility, impaired UE functional use, improper body mechanics, postural dysfunction, and pain.  REHAB POTENTIAL: Good  CLINICAL DECISION MAKING: Evolving/moderate  complexity  EVALUATION COMPLEXITY: Low   GOALS: Goals reviewed with patient? Yes  SHORT TERM GOALS: Target date: 01/01/23  Independent with initial HEP Goal status: 12/27/22 MET  LONG TERM GOALS: Target date: 03/22/23  Decrease pain 50% Goal status: progressing 07/25/23  2.  Dress without difficulty Goal status: progressing 07/25/23  3.  Do hair without difficulty Goal status: able to reach the top of head at times progressing 2/20/253/25/25 4.  Increase AROM right shoulder flexion to 130 degrees Goal status: 07/25/23 progressing 130 degrees with pain  5.  Increase right shoulder ER to 60 degrees Goal status: met 07/06/23  6.  Return to water  aerobics and or gym activity Goal status:progressing met 06/01/23  PLAN:  PT FREQUENCY: 1-2x/week  PT DURATION: 12 weeks  PLANNED INTERVENTIONS: Therapeutic exercises, Therapeutic activity, Neuromuscular re-education, Balance training, Gait training, Patient/Family education, Self Care, Joint mobilization, Dry Needling, Electrical stimulation, Cryotherapy, Vasopneumatic device, and Manual therapy  PLAN FOR NEXT SESSION:   will resume PT on the right shoulder conservatively to try to get back lost ROM and help with pain until she has US , will evaluate left shoulder soon 09/19/2023, 1:55 PM [facility] at [facility] [address] [facility] [address] [facility] [place], [place], [place], [address] Phone: [phone]   Fax:  [phone]

## 2023-09-21 ENCOUNTER — Ambulatory Visit: Admitting: Occupational Therapy

## 2023-09-21 ENCOUNTER — Other Ambulatory Visit: Payer: Self-pay

## 2023-09-21 ENCOUNTER — Encounter: Payer: Self-pay | Admitting: Occupational Therapy

## 2023-09-21 DIAGNOSIS — M25641 Stiffness of right hand, not elsewhere classified: Secondary | ICD-10-CM

## 2023-09-21 DIAGNOSIS — M542 Cervicalgia: Secondary | ICD-10-CM | POA: Diagnosis not present

## 2023-09-21 DIAGNOSIS — M79641 Pain in right hand: Secondary | ICD-10-CM

## 2023-09-21 DIAGNOSIS — M6281 Muscle weakness (generalized): Secondary | ICD-10-CM | POA: Diagnosis not present

## 2023-09-21 DIAGNOSIS — M79642 Pain in left hand: Secondary | ICD-10-CM | POA: Diagnosis not present

## 2023-09-21 DIAGNOSIS — R252 Cramp and spasm: Secondary | ICD-10-CM | POA: Diagnosis not present

## 2023-09-21 DIAGNOSIS — M25642 Stiffness of left hand, not elsewhere classified: Secondary | ICD-10-CM | POA: Diagnosis not present

## 2023-09-21 DIAGNOSIS — R278 Other lack of coordination: Secondary | ICD-10-CM

## 2023-09-21 DIAGNOSIS — G8929 Other chronic pain: Secondary | ICD-10-CM | POA: Diagnosis not present

## 2023-09-21 DIAGNOSIS — M25511 Pain in right shoulder: Secondary | ICD-10-CM | POA: Diagnosis not present

## 2023-09-21 DIAGNOSIS — R600 Localized edema: Secondary | ICD-10-CM | POA: Diagnosis not present

## 2023-09-21 DIAGNOSIS — M25611 Stiffness of right shoulder, not elsewhere classified: Secondary | ICD-10-CM | POA: Diagnosis not present

## 2023-09-21 IMAGING — MR MR HEAD W/O CM
10 series · 48 of 48 positions shown · non-contrast
Comparison: None Available.

CLINICAL DATA: Headache

EXAM:
MRI HEAD WITHOUT CONTRAST
TECHNIQUE: Multiplanar, multiecho pulse sequences of the brain and surrounding
structures were obtained without intravenous contrast.

[Series 2: DWI · axial · 3.0mm · 1.30mm/px · z∈[-40,+119]mm · 10 of 110 slices shown (1 of 4)]
[im 1/110]
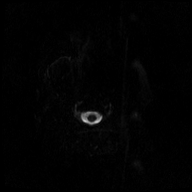
[im 13/110]
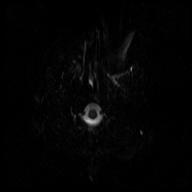
[im 25/110]
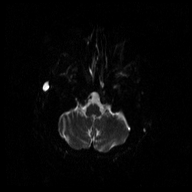
[im 37/110]
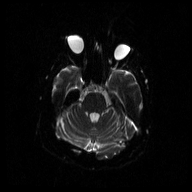
[im 49/110]
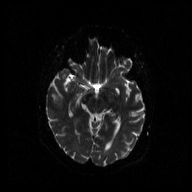
[im 61/110]
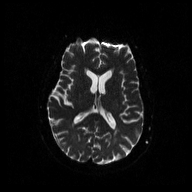
[im 73/110]
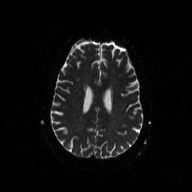
[im 85/110]
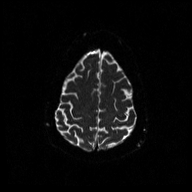
[im 97/110]
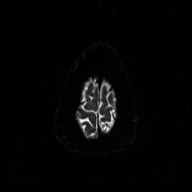
[im 110/110]
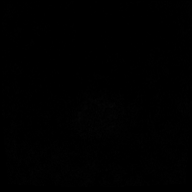

[Series 3: DWI · axial · 3.0mm · 1.30mm/px · z∈[-40,+119]mm · 4 of 55 slices shown (2 of 4)]
[im 1/55]
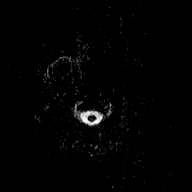
[im 19/55]
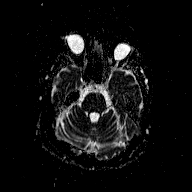
[im 37/55]
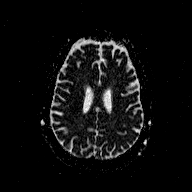
[im 55/55]
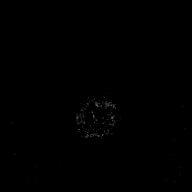

[Series 4: DWI · coronal · 5.0mm · 1.25mm/px · 5 of 67 slices shown (3 of 4)]
[im 1/67]
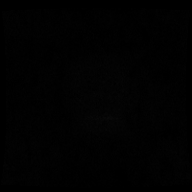
[im 17/67]
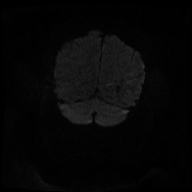
[im 34/67]
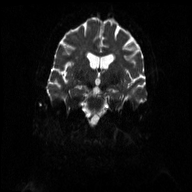
[im 50/67]
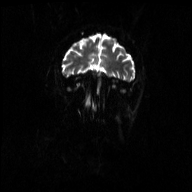
[im 67/67]
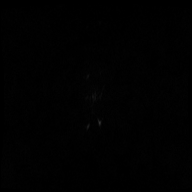

[Series 5: DWI · coronal · 5.0mm · 1.25mm/px · 3 of 34 slices shown (4 of 4)]
[im 1/34]
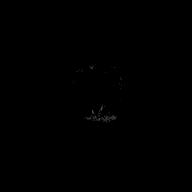
[im 17/34]
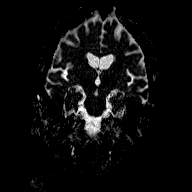
[im 34/34]
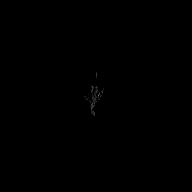

[Series 6: T1 · sagittal · 5.0mm · 0.45mm/px · 2 of 25 slices shown (1 of 2)]
[im 1/25]
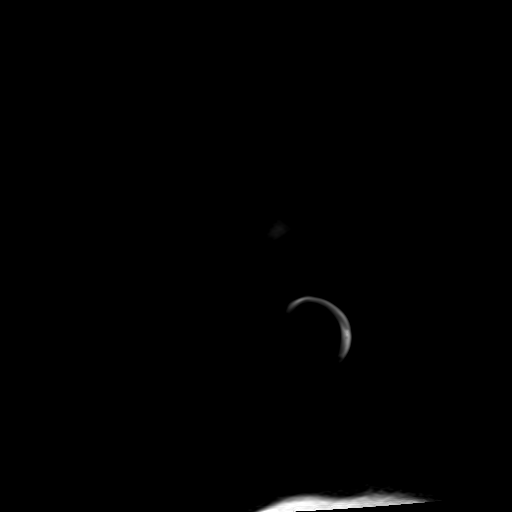
[im 25/25]
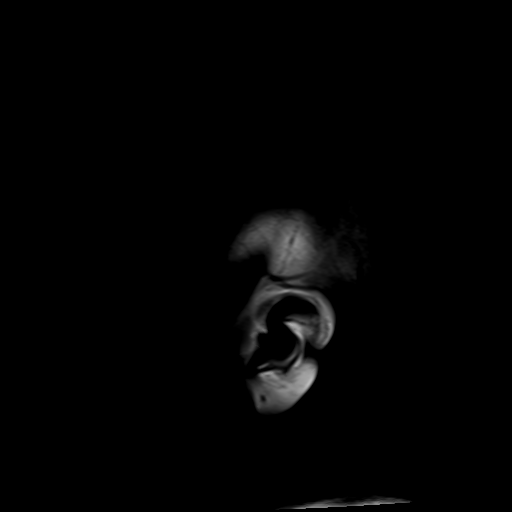

[Series 7: T2 · axial · 5.0mm · 0.72mm/px · z∈[-38,+113]mm · 2 of 23 slices shown (1 of 3)]
[im 1/23]
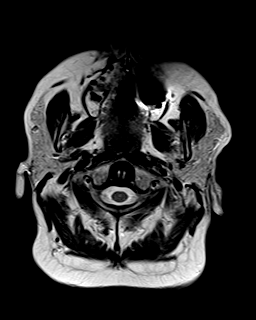
[im 23/23]
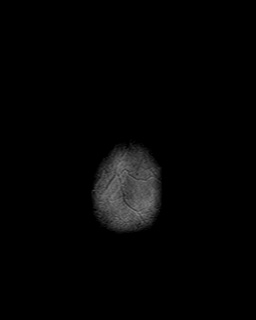

[Series 8: FLAIR · axial · 3.0mm · 0.45mm/px · z∈[-42,+117]mm · 4 of 55 slices shown]
[im 1/55]
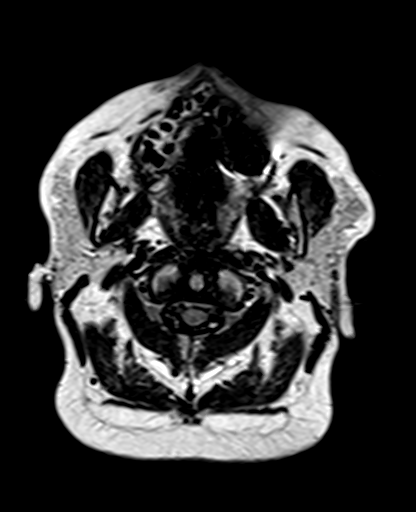
[im 19/55]
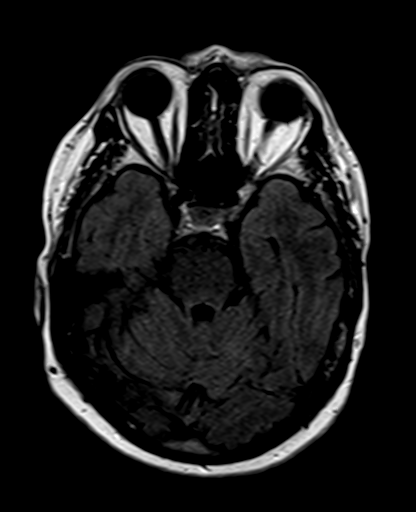
[im 37/55]
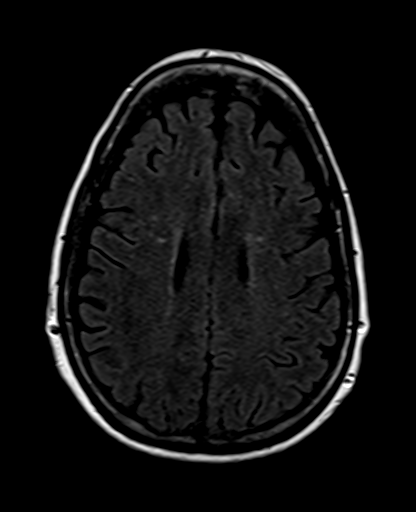
[im 55/55]
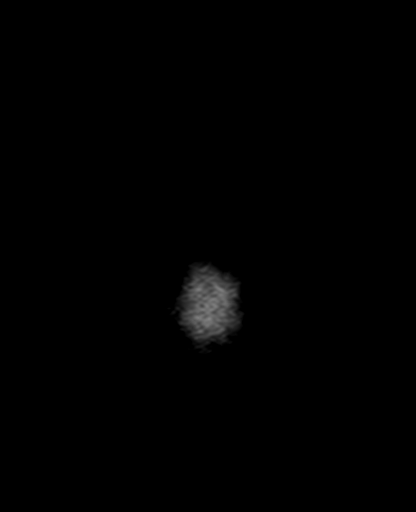

[Series 9: T2 · axial · 5.0mm · 0.72mm/px · z∈[-38,+114]mm · 2 of 23 slices shown (2 of 3)]
[im 1/23]
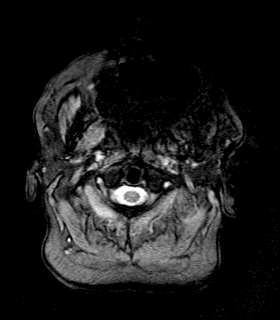
[im 23/23]
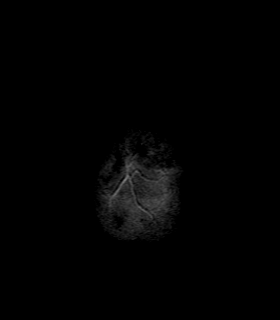

[Series 10: T1 · axial · 1.0mm · 0.94mm/px · z∈[-47,+124]mm · 14 of 175 slices shown (2 of 2)]
[im 1/175]
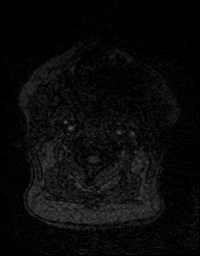
[im 14/175]
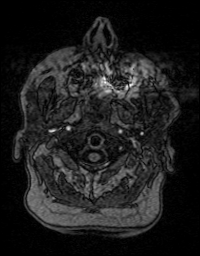
[im 27/175]
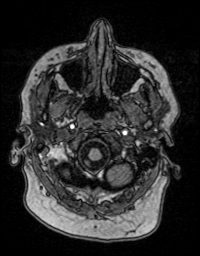
[im 41/175]
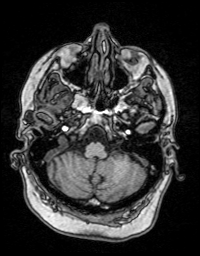
[im 54/175]
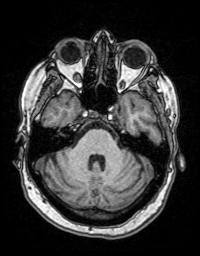
[im 67/175]
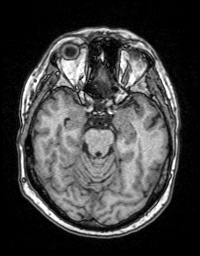
[im 81/175]
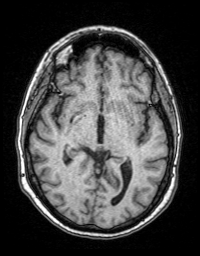
[im 94/175]
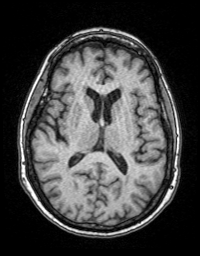
[im 108/175]
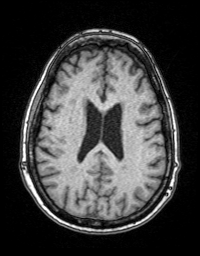
[im 121/175]
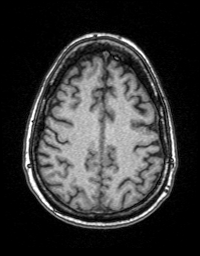
[im 134/175]
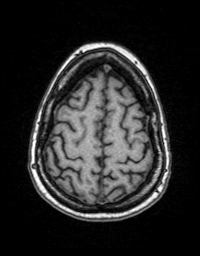
[im 148/175]
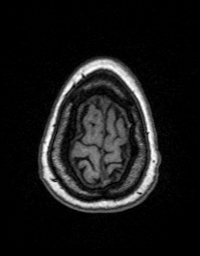
[im 161/175]
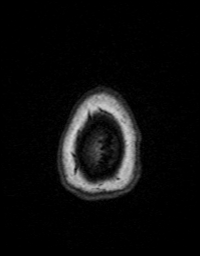
[im 175/175]
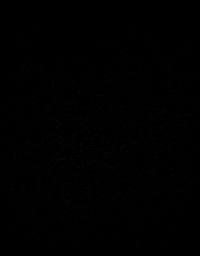

[Series 11: T2 · coronal · 5.0mm · 0.43mm/px · 2 of 31 slices shown (3 of 3)]
[im 1/31]
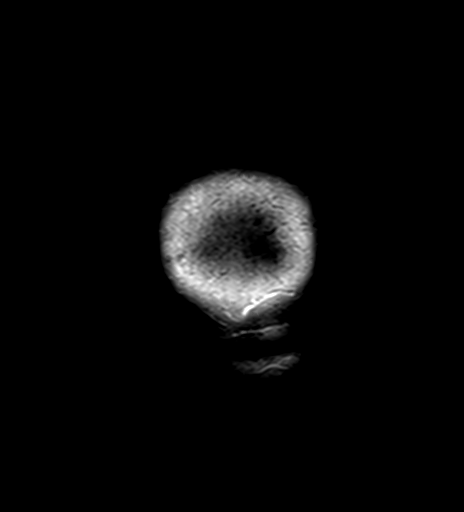
[im 31/31]
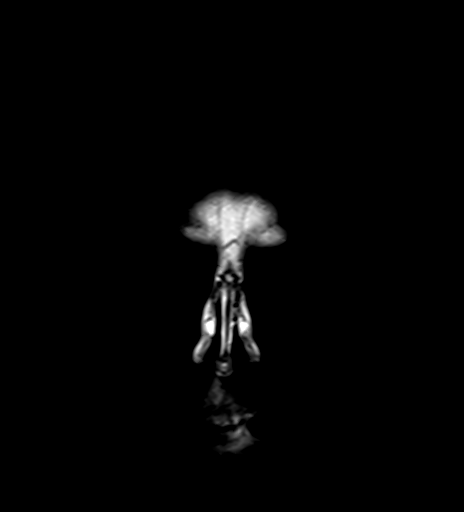

[48 of 48 positions shown; findings below may reference images not displayed]

FINDINGS: Brain: No acute infarct, mass effect or extra-axial collection. No
acute or chronic hemorrhage. Normal white matter signal, parenchymal
volume and CSF spaces. The midline structures are normal.

Vascular: Major flow voids are preserved.

Skull and upper cervical spine: Normal calvarium and skull base.
Visualized upper cervical spine and soft tissues are normal.

Sinuses/Orbits:No paranasal sinus fluid levels or advanced mucosal
thickening. No mastoid or middle ear effusion. Normal orbits.
IMPRESSION: Normal brain MRI.

## 2023-09-21 NOTE — Therapy (Signed)
 OUTPATIENT OCCUPATIONAL THERAPY ORTHO Treatment  Patient Name: Veronica Galvan MRN: [id number] DOB:Aug 14, 1955, 68 y.o., female Today's Date: 09/21/2023  PCP: Adra Alanis [name] REFERRING PROVIDER: Adra Alanis [name]  END OF SESSION:  OT End of Session - 09/21/23 1025     Visit Number 25    Number of Visits 41    Date for OT Re-Evaluation 11/07/23    Authorization Type [employer] M[employer]    Authorization Time Period 12 weeks    Authorization - Visit Number 25    Progress Note Due on Visit 26    OT Start Time 0845    OT Stop Time 0930    OT Time Calculation (min) 45 min    Activity Tolerance Patient tolerated treatment well    Behavior During Therapy WFL for tasks assessed/performed                        Past Medical History:  Diagnosis Date   Allergy     Anxiety    Arthritis    hands   Cataract    Diabetes mellitus without complication (HCC)    type 2   Family history of adverse reaction to anesthesia    son has malignant hyperthermia, daughter does not daughter recently had c section without problems   GERD (gastroesophageal reflux disease)    Headache    sinus   Hyperlipidemia    Hypertension    PONV (postoperative nausea and vomiting)    nausea only   Ulcer, stomach peptic yrs ago   Past Surgical History:  Procedure Laterality Date   APPENDECTOMY     both hells bone spur repair     both heels with metal clips   both shoulder rotator cuff repair     CESAREAN SECTION     x 1   COLONOSCOPY WITH PROPOFOL  N/A 09/07/2016   Procedure: COLONOSCOPY WITH PROPOFOL ;  Surgeon: Garrett Kallman, MD;  Location: [facility];  Service: Endoscopy;  Laterality: N/A;   colonscopy  06/2011   polyps   ELBOW FRACTURE SURGERY Left    EYE SURGERY     FRACTURE SURGERY     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/10/2022   Procedure: REVERSE SHOULDER ARTHROPLASTY;  Surgeon: Ellard Gunning, MD;  Location: [facility];  Service: Orthopedics;  Laterality: Right;   Please follow in room 6 if able   TUBAL LIGATION     VESICO-VAGINAL FISTULA REPAIR     Patient Active Problem List   Diagnosis Date Noted   Gastroesophageal reflux disease 11/16/2021   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 08/18/2021   Dermatofibroma 08/18/2021   History of malignant neoplasm of skin 08/18/2021   Lentigo 08/18/2021   Melanocytic nevi of trunk 08/18/2021   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   Rosacea 08/18/2021   Actinic keratosis 08/18/2021   Sensorineural hearing loss (SNHL) of left ear with unrestricted hearing of right ear 07/26/2021   Tinnitus of left ear 07/26/2021   Acute recurrent maxillary sinusitis 06/22/2021   Primary hypertension 01/12/2021   Hyperlipidemia associated with type 2 diabetes mellitus (HCC) 01/12/2021   Arthritis 01/12/2021   Type II diabetes mellitus (HCC) 11/25/2019   Ingrown toenail 09/05/2018   Neck pain 01/29/2018   Tick bite 10/24/2017    ONSET DATE: 05/19/23  REFERRING DIAG:  Diagnosis  M79.641,M79.642 (ICD-10-CM) - Bilateral hand pain    THERAPY DIAG:  Muscle weakness (generalized)  Pain in left hand  Stiffness of left hand,  not elsewhere classified  Stiffness of right hand, not elsewhere classified  Pain in right hand  Other lack of coordination  Rationale for Evaluation and Treatment: Rehabilitation  SUBJECTIVE:   SUBJECTIVE STATEMENT: Pt reports her right hand is more painful today and her R shoulder is bothering her. 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 Yes: NPRS scale: 8/10- right hand Pain location: right hand,  3/10 left hand Pain description: aching, stiff Aggravating  factors: gripping  Relieving factors: heat, meds Pt also has shoulder pain which is more significant grossly R shoulder 8/10 L shoulder pain 3/10 which OT will not address. PT is addressing shoulder FALLS: Has patient fallen in last 6 months? No  LIVING ENVIRONMENT: Lives with: lives alone Lives in: House/apartment Stairs: yes   PLOF: Independent  PATIENT GOALS: improve functional use of hands and learn adapted strategies for ADLs/IADLs.  NEXT MD VISIT: unknown  OBJECTIVE:  Note: Objective measures were completed at Evaluation unless otherwise noted.  HAND DOMINANCE: Right  ADLs: Overall ADLs: unable to grip credit card to remove from ATM Transfers/ambulation related to ADLs: mod I Eating: drops silverware Grooming: drops toothbrush, uses electric toothbrush, holding comb Upper body dressing: adjusting bra Lower body dressing: difficulty pulling up pants  Toileting: hygeine was difficult initally Bathing: mod I, difficult with shaving Tub shower transfers: walk in shower    FUNCTIONAL OUTCOME MEASURES: Quick Dash: 05/30/23- 75%% disability Quick Dash 08/15/23- 70.5% disability   UPPER EXTREMITY ROM:   RUE wrist flexion/ extension: 70/ 50, LUE wrist flexion/ extension: 70/ 45 Pt demonstrates grossly 50% composite flexion for right and 555 with left.   Active ROM Right eval Left eval  Thumb MCP (0-60) 45 55  Thumb IP (0-80) 25 10  Thumb Radial abd/add (0-55)     Thumb Palmar abd/add (0-45)     Thumb Opposition to Small Finger yes yes   Index MCP (0-90)     Index PIP (0-100)     Index DIP (0-70)      Long MCP (0-90)      Long PIP (0-100)      Long DIP (0-70)      Ring MCP (0-90)      Ring PIP (0-100)      Ring DIP (0-70)      Little MCP (0-90)      Little PIP (0-100)      Little DIP (0-70)      (Blank rows = not tested) 9 hole peg test (08/15/23) RUE 26.70 secs, LUE 23 secs  HAND FUNCTION: Grip strength: Right: 8 lbs; Left: 4 lbs,          08/15/23-  RUE  18, LUE 14 lbs,           08/15/23   Pinch: RUE tip 4lbs, lateral 8 lbs, LUE tip 3 lbs, lateral 8 lbs   SENSATION: Light touch: Impaired index finger LUE  EDEMA: Pt with bony defomities at PIP joints of all digits which pt reports are bone spurs, Pt also has bony deformity at DIP for right thumb, index and 5th digit and LUE small and index fingers  COGNITION: Overall cognitive status: Within functional limits for tasks assessed   OBSERVATIONS: Pleasant female desires to  increase functional use of bilateral UE's  , TREATMENT DATE:Paraffin to RUE x 10 mins, no adverse reactions, while therapist performed US  to left hand 3.3 mhz, 0.8 w/cm 2,  20%x 8 mins no adverse reactions for stiffness. Paraffin to LUE x 10 mins, no adverse reactions, while therapist performed US  to right hand 3.3 mhz, 0.8 w/cm 2,  20%x 8 mins no adverse reactions for stiffness.  Passive composite finger flexion to right hand  and then individual finger flexion to left hand . Yellow putty exercises for LUE only due to significant pain in right hand today, composite grip, roll and tip pinch, for increased grip and pinch min v.c Ice pack applied to left shoulder x 10 mins while pt perfromed, pt reports decreased R shoulder pain end of session. Pt practiced opening a ziploc bag and she demonstrates increased ease now using bilateral UE's. Pt reports ability to cut food independently using rocker knfe. She was provided with a foam piece to go over her rocker knife so she can carry to a restaurant.  5/20/25Paraffin to RUE x 10 mins, no adverse reactions, while therapist performed US  to LUE 3.3 mhz, 0.8 w/cm 2,  20%x 8 mins no adverse reactions for stiffness. Paraffin to LUE x 10 mins, no adverse reactions, while therapist performed US  to RUE 3.3 mhz, 0.8 w/cm 2,  20%x 8 mins no adverse reactions for stiffness.  Passive composite finger flexion, and individual finger flexion with exception of right middle finger due to pain. Place  and holds perfromed. Composite finger flexion/ extension with washcloth  x 10 reps followed by followed by resistive gripping of squeeze ball, then individual pinch with each digit for LUE.  09/11/23-Paraffin to RUE x 10 mins, no adverse reactions, while therapist performed US  to LUE 3.3 mhz, 0.8 w/cm 2,  20%x 8 mins no adverse reactions for stiffness. Paraffin to LUE x 10 mins, no adverse reactions, while therapist performed US  to RUE 3.3 mhz, 0.8 w/cm 2,  20%x 8 mins no adverse reactions for stiffness.  P/ROM to RUE PIP joints individually with exception of right middle finger then compositely in flexion followed by passive stretch to digits of LUE individually in composite flexion then all digits together for passive composite finger flexion. A/ROM individual finger extension for bilateral UE's. Reviewed yellow theraputty HEP for sustained grip and tip pinch bilateral UE's with exception of right middle finger.  09/07/23-Paraffin to RUE x 10 mins, no adverse reactions, while therapist performed US  to LUE 3.3 mhz, 0.8 w/cm 2,  20%x 8 mins no adverse reactions for stiffness. Paraffin to LUE x 10 mins, no adverse reactions, while therapist performed US  to RUE 3.3 mhz, 0.8 w/cm 2,  20%x 8 mins no adverse reactions for stiffness.  P/ROM to RUE PIP joints individually then compositely in flexion followed by passive stretch to digits of LUE individually in composite flexion then all digits together for passive composite finger flexion. A/ROM individual finger extension for bilateral UE's Washcloth scrunches and flattening with bilateral UE's min v.c  composite and individual finger flexion with resistive squeeze ball(tree) 10-20 reps each bilateral UE's   09/05/23-Paraffin to RUE x 10 mins, no adverse reactions, while therapist performed US  to LUE 3.3 mhz, 0.8 w/cm 2,  20%x 8 mins no adverse reactions for stiffness. Paraffin to LUE x 10 mins, no adverse reactions, while therapist performed US  to RUE 3.3 mhz,  0.8 w/cm 2,  20%x 8 mins no adverse reactions for stiffness.  P/ROM to RUE PIP joints  individually then compositely in flexion followed by passive stretch to digits of LUE individually in composite flexion then all digits together for passive composite finger flexion. A/ROM individual finger extension for bilateral UE's, then digiflex light resistance for individual and composite flexion. Pt transitioned to composite flexion with resistive squeeze ball due to discomfort with digit flex for RUE.   PATIENT EDUCATION: Education details:   see above Person educated: Patient Education method: Explanation, demonstration, v.c Education comprehension: verbalized understanding, returned demonstration  HOME EXERCISE PROGRAM: AROM yellow putty  GOALS: Goals reviewed with patient? Yes  SHORT TERM GOALS: Target date: 09/14/23  I with HEP  Goal status:  met 06/07/23  2.  I with positioning/ splinting prn to minimize pain and defomity   Goal status: met, 07/13/23  3.  Pt will increase bilateral grip strength by 5 lbs for increased UE functional use. Upgraded goal Pt will increase bilateral grip strength to at least 25 lbs (after stretching). Baseline: 05/30/23- RUE 8 lbs, LUE 4 lbs Goal status:met 08/01/23-R 20 lbs, L  19 lbs,  08/15/23- RUE 18 lbsLUE 14 lbs continue to address upgraded goal.  4. Pt will demonstrate improved RUE fine motor coordiantion as evidenced by perfroming 9 hole peg test in 23 secs or less.  Goal status: ongoing 09/19/23 5. I with updates to HEP.  Goal status: ongoing 09/19/23 6. Pt will demonstrate improved composite finger flexion for ADLs as evidenced by pt. bringing middle fingertip for RUE within 1/2 an inch of palm.  Baseline: 3/4 inch from palm to middle fingertip  Goal status : ongoing, 3/4 inch 09/19/23 7.Pt will demonstrate improved composite finger flexion for ADLs as evidenced by pt. bringing middle fingertip for LUE within 3/4 an inch of palm.  Baseline: 1 inch  from palm to middle finger tip  Goal status: met, 3/4 inch away from middle finger 09/19/23  LONG TERM GOALS: Target date:11/07/23  I with updated HEP  Goal status:met 08/15/22  2.  Pt will improve Quick Dash score to 70% disability Baseline: 75% (05/30/23) Goal status:   improved - however not fully met due to complications from shoulder pain-70.5%08/15/23  3.  Pt will demonstrate at least 65% composte flexion for LUE for increased functional use.  Goal status: met 65-70% , 08/15/23  4.  I with adapted strategies/ adapted equipment to minimize pain and to increase pt I with ADLs/IADLs  Goal status:  met for inital strategies, however continue goal as pt may report additional tasks that can benefit from modification.08/15/23  5.  Pt will demonstrate at least 65% composite flexion for RUE for increased functional use.  Goal status: met 70% 08/15/23  6. Pt will report that she is able to cut her meat such as steak or chicken modified independently.  [place]: unable  Goal status: met 09/21/23    7. Pt will report increased ease with opening/ closing ziplock bags.    Goal status: met 09/21/23    8. Pt will improve bilateral tip pinch by 2 lbs for increased ease with daily activities.     Goal status: ongoing 09/19/23    9. Pt will improve bilateral lateral pinch by 2 lbs for increased ease with ADLs.    Goal status: ongoing 09/19/23   ASSESSMENT: CLINICAL IMPRESSION: Pt is progressing towards goals. She met short term goal #6 and  7 and demonstrates improving UE functional use.PERFORMANCE DEFICITS: in functional skills including ADLs, IADLs, coordination, sensation, edema, ROM, strength, pain, flexibility, Fine motor control, Gross motor  control, endurance, decreased knowledge of precautions, decreased knowledge of use of DME, and UE functional use,, and psychosocial skills including coping strategies, environmental adaptation, habits, interpersonal interactions, and routines and behaviors.    IMPAIRMENTS: are limiting patient from ADLs, IADLs, rest and sleep, education, play, leisure, and social participation.   COMORBIDITIES: may have co-morbidities  that affects occupational performance. Patient will benefit from skilled OT to address above impairments and improve overall function.  MODIFICATION OR ASSISTANCE TO COMPLETE EVALUATION: No modification of tasks or assist necessary to complete an evaluation.  OT OCCUPATIONAL PROFILE AND HISTORY: Detailed assessment: Review of records and additional review of physical, cognitive, psychosocial history related to current functional performance.  CLINICAL DECISION MAKING: LOW - limited treatment options, no task modification necessary  REHAB POTENTIAL: Good  EVALUATION COMPLEXITY: Low      PLAN:  OT FREQUENCY: 2x/week  OT DURATION: 12 weeks( anticipate d/c after 8 weeks dependent on progress)  PLANNED INTERVENTIONS: 16109 OT Re-evaluation, 97535 self care/ADL training, 60454 therapeutic exercise, 97530 therapeutic activity, 97112 neuromuscular re-education, 97140 manual therapy, 97113 aquatic therapy, 97035 ultrasound, 97018 paraffin, 09811 fluidotherapy, 97010 moist heat, 97010 cryotherapy, 97034 contrast bath, 97760 Orthotics management and training, 91478 Splinting (initial encounter), [id number] Subsequent splinting/medication, passive range of motion, energy conservation, coping strategies training, patient/family education, and DME and/or AE instructions  RECOMMENDED OTHER SERVICES: none  CONSULTED AND AGREED WITH PLAN OF CARE: Patient  PLAN FOR NEXT SESSION:  continue to address ADL strategies,  check grip strength   Eiley Mcginnity, OT 09/21/2023, 10:25 AM

## 2023-09-27 ENCOUNTER — Ambulatory Visit: Admitting: Occupational Therapy

## 2023-09-27 ENCOUNTER — Other Ambulatory Visit: Payer: Self-pay

## 2023-09-27 ENCOUNTER — Encounter: Payer: Self-pay | Admitting: Physical Therapy

## 2023-09-27 ENCOUNTER — Encounter: Payer: Self-pay | Admitting: Occupational Therapy

## 2023-09-27 ENCOUNTER — Ambulatory Visit: Admitting: Physical Therapy

## 2023-09-27 DIAGNOSIS — M79642 Pain in left hand: Secondary | ICD-10-CM

## 2023-09-27 DIAGNOSIS — M25511 Pain in right shoulder: Secondary | ICD-10-CM | POA: Diagnosis not present

## 2023-09-27 DIAGNOSIS — R252 Cramp and spasm: Secondary | ICD-10-CM

## 2023-09-27 DIAGNOSIS — M79641 Pain in right hand: Secondary | ICD-10-CM | POA: Diagnosis not present

## 2023-09-27 DIAGNOSIS — G8929 Other chronic pain: Secondary | ICD-10-CM

## 2023-09-27 DIAGNOSIS — M25642 Stiffness of left hand, not elsewhere classified: Secondary | ICD-10-CM | POA: Diagnosis not present

## 2023-09-27 DIAGNOSIS — M25611 Stiffness of right shoulder, not elsewhere classified: Secondary | ICD-10-CM | POA: Diagnosis not present

## 2023-09-27 DIAGNOSIS — M542 Cervicalgia: Secondary | ICD-10-CM | POA: Diagnosis not present

## 2023-09-27 DIAGNOSIS — R600 Localized edema: Secondary | ICD-10-CM | POA: Diagnosis not present

## 2023-09-27 DIAGNOSIS — M25641 Stiffness of right hand, not elsewhere classified: Secondary | ICD-10-CM

## 2023-09-27 DIAGNOSIS — R278 Other lack of coordination: Secondary | ICD-10-CM

## 2023-09-27 DIAGNOSIS — M6281 Muscle weakness (generalized): Secondary | ICD-10-CM

## 2023-09-27 NOTE — Therapy (Unsigned)
 OUTPATIENT OCCUPATIONAL THERAPY ORTHO Treatment  Patient Name: Veronica Galvan MRN: [id number] DOB:09-Jun-1955, 68 y.o., female Today's Date: 09/27/2023  PCP: Adra Alanis [name] REFERRING PROVIDER: Adra Alanis [name]  END OF SESSION:  OT End of Session - 09/27/23 1444     Visit Number 25    Number of Visits 41    Date for OT Re-Evaluation 11/07/23    Authorization Type [employer] MCR    Authorization Time Period 12 weeks    Progress Note Due on Visit 26    OT Start Time 1400    OT Stop Time 1445    OT Time Calculation (min) 45 min    Activity Tolerance Patient tolerated treatment well    Behavior During Therapy WFL for tasks assessed/performed                         Past Medical History:  Diagnosis Date   Allergy     Anxiety    Arthritis    hands   Cataract    Diabetes mellitus without complication (HCC)    type 2   Family history of adverse reaction to anesthesia    son has malignant hyperthermia, daughter does not daughter recently had c section without problems   GERD (gastroesophageal reflux disease)    Headache    sinus   Hyperlipidemia    Hypertension    PONV (postoperative nausea and vomiting)    nausea only   Ulcer, stomach peptic yrs ago   Past Surgical History:  Procedure Laterality Date   APPENDECTOMY     both hells bone spur repair     both heels with metal clips   both shoulder rotator cuff repair     CESAREAN SECTION     x 1   COLONOSCOPY WITH PROPOFOL  N/A 09/07/2016   Procedure: COLONOSCOPY WITH PROPOFOL ;  Surgeon: Garrett Kallman, MD;  Location: [facility];  Service: Endoscopy;  Laterality: N/A;   colonscopy  06/2011   polyps   ELBOW FRACTURE SURGERY Left    EYE SURGERY     FRACTURE SURGERY     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/10/2022   Procedure: REVERSE SHOULDER ARTHROPLASTY;  Surgeon: Ellard Gunning, MD;  Location: [facility];  Service: Orthopedics;  Laterality: Right;  Please follow in room 6 if able    TUBAL LIGATION     VESICO-VAGINAL FISTULA REPAIR     Patient Active Problem List   Diagnosis Date Noted   Gastroesophageal reflux disease 11/16/2021   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 08/18/2021   Dermatofibroma 08/18/2021   History of malignant neoplasm of skin 08/18/2021   Lentigo 08/18/2021   Melanocytic nevi of trunk 08/18/2021   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   Rosacea 08/18/2021   Actinic keratosis 08/18/2021   Sensorineural hearing loss (SNHL) of left ear with unrestricted hearing of right ear 07/26/2021   Tinnitus of left ear 07/26/2021   Acute recurrent maxillary sinusitis 06/22/2021   Primary hypertension 01/12/2021   Hyperlipidemia associated with type 2 diabetes mellitus (HCC) 01/12/2021   Arthritis 01/12/2021   Type II diabetes mellitus (HCC) 11/25/2019   Ingrown toenail 09/05/2018   Neck pain 01/29/2018   Tick bite 10/24/2017    ONSET DATE: 05/19/23  REFERRING DIAG:  Diagnosis  M79.641,M79.642 (ICD-10-CM) - Bilateral hand pain    THERAPY DIAG:  Muscle weakness (generalized)  Other lack of coordination  Stiffness of left hand, not elsewhere classified  Stiffness of right  hand, not elsewhere classified  Pain in right hand  Pain in left hand  Rationale for Evaluation and Treatment: Rehabilitation  SUBJECTIVE:   SUBJECTIVE STATEMENT: Pt reports her right hand is more painful today and her R shoulder is bothering her. 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 Yes: NPRS scale: 8/10- right hand Pain location: right hand,  3/10 left hand Pain description: aching, stiff Aggravating factors: gripping  Relieving factors: heat,  meds Pt also has shoulder pain which is more significant grossly R shoulder 8/10 L shoulder pain 3/10 which OT will not address. PT is addressing shoulder FALLS: Has patient fallen in last 6 months? No  LIVING ENVIRONMENT: Lives with: lives alone Lives in: House/apartment Stairs: yes   PLOF: Independent  PATIENT GOALS: improve functional use of hands and learn adapted strategies for ADLs/IADLs.  NEXT MD VISIT: unknown  OBJECTIVE:  Note: Objective measures were completed at Evaluation unless otherwise noted.  HAND DOMINANCE: Right  ADLs: Overall ADLs: unable to grip credit card to remove from ATM Transfers/ambulation related to ADLs: mod I Eating: drops silverware Grooming: drops toothbrush, uses electric toothbrush, holding comb Upper body dressing: adjusting bra Lower body dressing: difficulty pulling up pants  Toileting: hygeine was difficult initally Bathing: mod I, difficult with shaving Tub shower transfers: walk in shower    FUNCTIONAL OUTCOME MEASURES: Quick Dash: 05/30/23- 75%% disability Quick Dash 08/15/23- 70.5% disability   UPPER EXTREMITY ROM:   RUE wrist flexion/ extension: 70/ 50, LUE wrist flexion/ extension: 70/ 45 Pt demonstrates grossly 50% composite flexion for right and 555 with left.   Active ROM Right eval Left eval  Thumb MCP (0-60) 45 55  Thumb IP (0-80) 25 10  Thumb Radial abd/add (0-55)     Thumb Palmar abd/add (0-45)     Thumb Opposition to Small Finger yes yes   Index MCP (0-90)     Index PIP (0-100)     Index DIP (0-70)      Long MCP (0-90)      Long PIP (0-100)      Long DIP (0-70)      Ring MCP (0-90)      Ring PIP (0-100)      Ring DIP (0-70)      Little MCP (0-90)      Little PIP (0-100)      Little DIP (0-70)      (Blank rows = not tested) 9 hole peg test (08/15/23) RUE 26.70 secs, LUE 23 secs  HAND FUNCTION: Grip strength: Right: 8 lbs; Left: 4 lbs,          08/15/23-  RUE 18, LUE 14 lbs,           08/15/23   Pinch:  RUE tip 4lbs, lateral 8 lbs, LUE tip 3 lbs, lateral 8 lbs   SENSATION: Light touch: Impaired index finger LUE  EDEMA: Pt with bony defomities at PIP joints of all digits which pt reports are bone spurs, Pt also has bony deformity at DIP for right thumb, index and 5th digit and LUE small and index fingers  COGNITION: Overall cognitive status: Within functional limits for tasks assessed   OBSERVATIONS: Pleasant female desires to increase functional use of bilateral UE's  ,  TREATMENT DATE:Paraffin to RUE x 10 mins, no adverse reactions, while therapist performed US  to left hand 3.3 mhz, 0.8 w/cm 2,  20%x 8 mins no adverse reactions for stiffness. Paraffin to LUE x 10 mins, no adverse reactions, while therapist performed US  to right hand 3.3 mhz, 0.8 w/cm 2,  20%x 8 mins no adverse reactions for stiffness.  Passive composite finger flexion to right hand  and then individual finger flexion to left hand . Yellow putty exercises for LUE only due to significant pain in right hand today, composite grip, roll and tip pinch, for increased grip and pinch min v.c Ice pack applied to left shoulder x 10 mins while pt perfromed, pt reports decreased R shoulder pain end of session. Pt practiced opening a ziploc bag and she demonstrates increased ease now using bilateral UE's. Pt reports ability to cut food independently using rocker knfe. She was provided with a foam piece to go over her rocker knife so she can carry to a restaurant.  5/20/25Paraffin to RUE x 10 mins, no adverse reactions, while therapist performed US  to LUE 3.3 mhz, 0.8 w/cm 2,  20%x 8 mins no adverse reactions for stiffness. Paraffin to LUE x 10 mins, no adverse reactions, while therapist performed US  to RUE 3.3 mhz, 0.8 w/cm 2,  20%x 8 mins no adverse reactions for stiffness.  Passive composite finger flexion, and individual finger flexion with exception of right middle finger due to pain. Place and holds perfromed. Composite finger  flexion/ extension with washcloth  x 10 reps followed by followed by resistive gripping of squeeze ball, then individual pinch with each digit for LUE.  09/11/23-Paraffin to RUE x 10 mins, no adverse reactions, while therapist performed US  to LUE 3.3 mhz, 0.8 w/cm 2,  20%x 8 mins no adverse reactions for stiffness. Paraffin to LUE x 10 mins, no adverse reactions, while therapist performed US  to RUE 3.3 mhz, 0.8 w/cm 2,  20%x 8 mins no adverse reactions for stiffness.  P/ROM to RUE PIP joints individually with exception of right middle finger then compositely in flexion followed by passive stretch to digits of LUE individually in composite flexion then all digits together for passive composite finger flexion. A/ROM individual finger extension for bilateral UE's. Reviewed yellow theraputty HEP for sustained grip and tip pinch bilateral UE's with exception of right middle finger.  09/07/23-Paraffin to RUE x 10 mins, no adverse reactions, while therapist performed US  to LUE 3.3 mhz, 0.8 w/cm 2,  20%x 8 mins no adverse reactions for stiffness. Paraffin to LUE x 10 mins, no adverse reactions, while therapist performed US  to RUE 3.3 mhz, 0.8 w/cm 2,  20%x 8 mins no adverse reactions for stiffness.  P/ROM to RUE PIP joints individually then compositely in flexion followed by passive stretch to digits of LUE individually in composite flexion then all digits together for passive composite finger flexion. A/ROM individual finger extension for bilateral UE's Washcloth scrunches and flattening with bilateral UE's min v.c  composite and individual finger flexion with resistive squeeze ball(tree) 10-20 reps each bilateral UE's   09/05/23-Paraffin to RUE x 10 mins, no adverse reactions, while therapist performed US  to LUE 3.3 mhz, 0.8 w/cm 2,  20%x 8 mins no adverse reactions for stiffness. Paraffin to LUE x 10 mins, no adverse reactions, while therapist performed US  to RUE 3.3 mhz, 0.8 w/cm 2,  20%x 8 mins no adverse  reactions for stiffness.  P/ROM to RUE PIP joints individually then compositely in flexion followed by passive  stretch to digits of LUE individually in composite flexion then all digits together for passive composite finger flexion. A/ROM individual finger extension for bilateral UE's, then digiflex light resistance for individual and composite flexion. Pt transitioned to composite flexion with resistive squeeze ball due to discomfort with digit flex for RUE.   PATIENT EDUCATION: Education details:   see above Person educated: Patient Education method: Explanation, demonstration, v.c Education comprehension: verbalized understanding, returned demonstration  HOME EXERCISE PROGRAM: AROM yellow putty  GOALS: Goals reviewed with patient? Yes  SHORT TERM GOALS: Target date: 09/14/23  I with HEP  Goal status:  met 06/07/23  2.  I with positioning/ splinting prn to minimize pain and defomity   Goal status: met, 07/13/23  3.  Pt will increase bilateral grip strength by 5 lbs for increased UE functional use. Upgraded goal Pt will increase bilateral grip strength to at least 25 lbs (after stretching). Baseline: 05/30/23- RUE 8 lbs, LUE 4 lbs Goal status:met 08/01/23-R 20 lbs, L  19 lbs,  08/15/23- RUE 18 lbsLUE 14 lbs continue to address upgraded goal.  4. Pt will demonstrate improved RUE fine motor coordiantion as evidenced by perfroming 9 hole peg test in 23 secs or less.  Goal status: ongoing 09/19/23 5. I with updates to HEP.  Goal status: ongoing 09/19/23 6. Pt will demonstrate improved composite finger flexion for ADLs as evidenced by pt. bringing middle fingertip for RUE within 1/2 an inch of palm.  Baseline: 3/4 inch from palm to middle fingertip  Goal status : ongoing, 3/4 inch 09/19/23 7.Pt will demonstrate improved composite finger flexion for ADLs as evidenced by pt. bringing middle fingertip for LUE within 3/4 an inch of palm.  Baseline: 1 inch from palm to middle finger tip  Goal  status: met, 3/4 inch away from middle finger 09/19/23  LONG TERM GOALS: Target date:11/07/23  I with updated HEP  Goal status:met 08/15/22  2.  Pt will improve Quick Dash score to 70% disability Baseline: 75% (05/30/23) Goal status:   improved - however not fully met due to complications from shoulder pain-70.5%08/15/23  3.  Pt will demonstrate at least 65% composte flexion for LUE for increased functional use.  Goal status: met 65-70% , 08/15/23  4.  I with adapted strategies/ adapted equipment to minimize pain and to increase pt I with ADLs/IADLs  Goal status:  met for inital strategies, however continue goal as pt may report additional tasks that can benefit from modification.08/15/23  5.  Pt will demonstrate at least 65% composite flexion for RUE for increased functional use.  Goal status: met 70% 08/15/23  6. Pt will report that she is able to cut her meat such as steak or chicken modified independently.  [place]: unable  Goal status: met 09/21/23    7. Pt will report increased ease with opening/ closing ziplock bags.    Goal status: met 09/21/23    8. Pt will improve bilateral tip pinch by 2 lbs for increased ease with daily activities.     Goal status: ongoing 09/19/23    9. Pt will improve bilateral lateral pinch by 2 lbs for increased ease with ADLs.    Goal status: ongoing 09/19/23   ASSESSMENT: CLINICAL IMPRESSION: Pt is progressing towards goals. She met short term goal #6 and  7 and demonstrates improving UE functional use.PERFORMANCE DEFICITS: in functional skills including ADLs, IADLs, coordination, sensation, edema, ROM, strength, pain, flexibility, Fine motor control, Gross motor control, endurance, decreased knowledge of precautions, decreased knowledge  of use of DME, and UE functional use,, and psychosocial skills including coping strategies, environmental adaptation, habits, interpersonal interactions, and routines and behaviors.   IMPAIRMENTS: are limiting patient  from ADLs, IADLs, rest and sleep, education, play, leisure, and social participation.   COMORBIDITIES: may have co-morbidities  that affects occupational performance. Patient will benefit from skilled OT to address above impairments and improve overall function.  MODIFICATION OR ASSISTANCE TO COMPLETE EVALUATION: No modification of tasks or assist necessary to complete an evaluation.  OT OCCUPATIONAL PROFILE AND HISTORY: Detailed assessment: Review of records and additional review of physical, cognitive, psychosocial history related to current functional performance.  CLINICAL DECISION MAKING: LOW - limited treatment options, no task modification necessary  REHAB POTENTIAL: Good  EVALUATION COMPLEXITY: Low      PLAN:  OT FREQUENCY: 2x/week  OT DURATION: 12 weeks( anticipate d/c after 8 weeks dependent on progress)  PLANNED INTERVENTIONS: 09811 OT Re-evaluation, 97535 self care/ADL training, 91478 therapeutic exercise, 97530 therapeutic activity, 97112 neuromuscular re-education, 97140 manual therapy, 97113 aquatic therapy, 97035 ultrasound, 97018 paraffin, 29562 fluidotherapy, 97010 moist heat, 97010 cryotherapy, 97034 contrast bath, 97760 Orthotics management and training, 13086 Splinting (initial encounter), [id number] Subsequent splinting/medication, passive range of motion, energy conservation, coping strategies training, patient/family education, and DME and/or AE instructions  RECOMMENDED OTHER SERVICES: none  CONSULTED AND AGREED WITH PLAN OF CARE: Patient  PLAN FOR NEXT SESSION:  continue to address ADL strategies,  check grip strength   Ivette Castronova, OT 09/27/2023, 2:44 PM

## 2023-09-27 NOTE — Therapy (Signed)
 OUTPATIENT PHYSICAL THERAPY SHOULDER    Patient Name: Veronica Galvan MRN: [id number] DOB:1955-08-29, 68 y.o., female Today's Date: 09/27/2023  END OF SESSION:  PT End of Session - 09/27/23 1443     Visit Number 68    Date for PT Re-Evaluation 10/01/23    Authorization Type [employer] [place]    PT Start Time 1445    PT Stop Time 1545    PT Time Calculation (min) 60 min    Activity Tolerance Patient tolerated treatment well    Behavior During Therapy WFL for tasks assessed/performed             Past Medical History:  Diagnosis Date   Allergy     Anxiety    Arthritis    hands   Cataract    Diabetes mellitus without complication (HCC)    type 2   Family history of adverse reaction to anesthesia    son has malignant hyperthermia, daughter does not daughter recently had c section without problems   GERD (gastroesophageal reflux disease)    Headache    sinus   Hyperlipidemia    Hypertension    PONV (postoperative nausea and vomiting)    nausea only   Ulcer, stomach peptic yrs ago   Past Surgical History:  Procedure Laterality Date   APPENDECTOMY     both hells bone spur repair     both heels with metal clips   both shoulder rotator cuff repair     CESAREAN SECTION     x 1   COLONOSCOPY WITH PROPOFOL  N/A 09/07/2016   Procedure: COLONOSCOPY WITH PROPOFOL ;  Surgeon: Garrett Kallman, MD;  Location: [facility];  Service: Endoscopy;  Laterality: N/A;   colonscopy  06/2011   polyps   ELBOW FRACTURE SURGERY Left    EYE SURGERY     FRACTURE SURGERY     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/10/2022   Procedure: REVERSE SHOULDER ARTHROPLASTY;  Surgeon: Ellard Gunning, MD;  Location: [facility];  Service: Orthopedics;  Laterality: Right;  Please follow in room 6 if able   TUBAL LIGATION     VESICO-VAGINAL FISTULA REPAIR     Patient Active Problem List   Diagnosis Date Noted   Gastroesophageal reflux disease 11/16/2021   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 08/18/2021   Dermatofibroma 08/18/2021   History of malignant neoplasm of skin 08/18/2021   Lentigo 08/18/2021   Melanocytic nevi of trunk 08/18/2021   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   Rosacea 08/18/2021   Actinic keratosis 08/18/2021   Sensorineural hearing loss (SNHL) of left ear with unrestricted hearing of right ear 07/26/2021   Tinnitus of left ear 07/26/2021   Acute recurrent maxillary sinusitis 06/22/2021   Primary hypertension 01/12/2021   Hyperlipidemia associated with type 2 diabetes mellitus (HCC) 01/12/2021   Arthritis 01/12/2021   Type II diabetes mellitus (HCC) 11/25/2019   Ingrown toenail 09/05/2018   Neck pain 01/29/2018   Tick bite 10/24/2017    PCP: Ike Malady, [name]  REFERRING PROVIDER: Alfredo Ano, MD  REFERRING DIAG: s/p right reverse TSA  THERAPY DIAG:  No diagnosis found.  Rationale for Evaluation and Treatment: Rehabilitation  ONSET DATE: 11/05/22  SUBJECTIVE:  SUBJECTIVE STATEMENT:   Still awaiting US  of the right shoulder on June 17th to see if there is loosening of the prosthesis.   Patient saw sports med MD, got an injection in the left shoulder , She got good relief of the left shoulder pain has an US  scheduled for the right shoulder June 17th.  Reports that the pain in the left is much improved with better ROM, still very painful in the right  PAIN:  Are you having pain? Yes: NPRS scale: 6/10 Pain location: right shoulder, HA Pain description: dull ache at rest, sharp with motions Aggravating factors: quick motions, movements pain up to 10/10 Relieving factors: ice, rest, Tylenol  at best 3/10  PRECAUTIONS: Shoulder Protocol in the chart  RED FLAGS: None   WEIGHT BEARING RESTRICTIONS: No  FALLS:  Has patient fallen in last 6 months? Yes.  Number of falls 1  LIVING ENVIRONMENT: Lives with: lives alone Lives in: House/apartment Stairs: No Has following equipment at home: None  OCCUPATION: retired  PLOF: Independent  PATIENT GOALS:dress without difficulty, do hair, have good ROM and less pain  NEXT MD VISIT:   OBJECTIVE:   DIAGNOSTIC FINDINGS:  See above  PATIENT SURVEYS:  FOTO 21  COGNITION: Overall cognitive status: Within functional limits for tasks assessed     SENSATION: WFL  POSTURE: Fwd head, rounded shoulders, elevated and guarded shoulder  UPPER EXTREMITY ROM:   Active ROM Right PROM eval Left AROM  08/31/23 PROM Supine 01/05/23 PROM  01/26/23 PROM 02/09/23 AROM sitting 02/14/23 AROM 02/21/23 AROM  03/02/23 AROM  03/22/23 AAROM 03/28/23 AROM Standing 04/13/23 AROM Standing 05/02/23 AROM  Standing 05/11/23 AROM  Standing 05/25/23 AROM Standing 06/01/23 AROM Standing  07/06/23 AROM  Standing 07/27/23 AROM Standing 08/31/23  Shoulder flexion 35 90 118  123 90 96 95 100 111 110 112 120 125  129 132 130 105  Shoulder extension 5                   Shoulder abduction 20 95 90 92  73 80 83 90 93 90 92 98 100 102  105 95 81  Shoulder adduction                    Shoulder internal rotation 15 35            40 40  42 25  Shoulder external rotation 20 65 45 30  41  45 50 53 56 56 60 66 67 70 70  47  Elbow flexion 120                   Elbow extension 5                   Wrist flexion                    Wrist extension                    Wrist ulnar deviation                    Wrist radial deviation                    Wrist pronation                    Wrist supination                    (Blank  rows = not tested)  UPPER EXTREMITY MMT:  No tested due to recent surgery  MMT Right eval Left eval  Shoulder flexion    Shoulder extension    Shoulder abduction    Shoulder adduction    Shoulder internal rotation    Shoulder external rotation    Middle trapezius    Lower trapezius    Elbow  flexion    Elbow extension    Wrist flexion    Wrist extension    Wrist ulnar deviation    Wrist radial deviation    Wrist pronation    Wrist supination    Grip strength (lbs)    (Blank rows = not tested)  PALPATION:  Very tight and tender in the pectoral, upper trap, the entire right upper arm   TODAY'S TREATMENT:                                                                                                                                         DATE:  09/27/23 STM to the right upper trap, the rhomboid and the cervical area Gentle PROM of the right shoulder to her tolerance Vaso medium pressure 34 degrees  09/19/23 Passive stretch of the right and left shoulder STM to the upper traps, neck, rhomboids and upper arms Vaso to the right shoulder  09/13/23 AROM of the left shoulder today was much improved 110 degrees  Walking with encouragement of normal arm swing and no elevation of the shoulders PROM bilateral shoulders STM to the right upper arm, right upper trap and tight neck Vaso medium pressure  09/07/23 Reviewed US  of the left shoulder and x-rays as patient had questions PROM bilateral shoulders STM to the upper traps, rhomboids, cervical area Vaso to bilateral shoulders medium pressure   09/05/23 Nustep level 3 x 5 minutes UBE level 2 x 3 minutes US  to the left shoulder/upper arm STM to the neck and upper traps Passive stretch shoulders She then went to OT and after I put her on the VASO  08/31/23 Discussion as noted below, measured both shoulder AROM in standing as noted above STM to the right upper arm and shoulder Passive stretch of the right shoulder to her point of pain Vaso bilateral shoulders for pain medium pressure   08/03/23 Pateint had a lot of questions regarding the bloodwork and the bone scan and what they are looking for, I strongly advised her to follow the MD/PA orders to wear sling and not use the arm, I did my best to answer her questions.   Finished with Vaso medium pressure 34 degrees  07/27/23 Gentle shoulder distractions Gentle PROM to her tolerance, a lot of cues to relax Neural tension stretches of the right UE STM to the upper trap, neck and rhomboid STM to the biceps, deltoid and pectoral area  07/25/23 UBE level 3 x 4 minutes for mm perfusion Lats 15# too painful  Seated row 10# Doorway stretch PROM right shoulder all motions to tolerance STM to the tight upper arm and the right pectoral area  07/20/23 UBE level 3 x 4 minutes Ball vs wall Rolling ball up wall Small weighted ball toss 2 ways and then with her doing ER Passive stretch right shoulder STM to the right shoulder and upper arm  07/18/23 Passive stretch right shoulder Gentle joint mobs  UE neural tension stretches STM to the right pectoral, right biceps, deltoid area Wall slides Wall circles Doorway stretch  PATIENT EDUCATION: Education details: poc/hep Person educated: Patient Education method: Programmer, multimedia, Facilities manager, Actor cues, Verbal cues, and Handouts Education comprehension: verbalized understanding  HOME EXERCISE PROGRAM: Access Code: [email or web address] URL: [email or web address] Date: 12/20/2022 Prepared by: Cherylene Corrente  Exercises - Seated Shoulder Flexion Towel Slide at Table Top  - 2 x daily - 7 x weekly - 2 sets - 10 reps - 3 hold - Seated Shoulder External Rotation PROM on Table  - 2 x daily - 7 x weekly - 2 sets - 10 reps - 3 hold - Seated Elbow Extension and Shoulder External Rotation AAROM at Table with Towel  - 2 x daily - 7 x weekly - 2 sets - 10 reps - 3 hold - Circular Shoulder Pendulum with Table Support  - 2 x daily - 7 x weekly - 2 sets - 10 reps - 3 hold  Access Code: [email or web address] URL: [email or web address] Date: 07/13/2023 Prepared by: Cherylene Corrente  Exercises - Isometric Shoulder Flexion at Wall  - 1 x daily - 7 x weekly - 1 sets - 10 reps - 3 hold - Standing Isometric Shoulder  Internal Rotation at Doorway  - 1 x daily - 7 x weekly - 1 sets - 10 reps - 3 hold - Isometric Shoulder Extension at Wall  - 1 x daily - 7 x weekly - 1 sets - 10 reps - 3 hold - Isometric Shoulder Abduction at Wall  - 1 x daily - 7 x weekly - 1 sets - 10 reps - 3 hold - Standing Isometric Shoulder External Rotation with Doorway  - 1 x daily - 7 x weekly - 1 sets - 10 reps - 3 hold  ASSESSMENT:  CLINICAL IMPRESSION:  Patient is awaiting the US  to see if the prosthesis is in place and snug and without issue, I am still being conservative and working on spasms and ROM.   Patient saw surgeon, the bone scan said did not feel that there was infection or loosening however the surgeon is not so sure, he wants to do an US  to be sure, that is scheduled for the next few weeks, since she has not been to PT in a month I re-measured her ROM, she has lost at least 15 degrees of motion and more for all motions.  She is also having more pain in the left shoulder possibly from overdoing it, because she is trying to not use the right shoulder.  She has talked about this with her primary and I suggested that she see a sports medicine MD, she called while she was here and will have appointment with him next week.  As far as the right shoulder the Surgeon stated in his notes to resume formal PT on a conservative basis to help with ROM and pain until the US  is performed   OBJECTIVE IMPAIRMENTS: cardiopulmonary status limiting activity, decreased activity tolerance, decreased endurance, decreased ROM, decreased strength, increased edema, increased muscle spasms, impaired flexibility, impaired UE  functional use, improper body mechanics, postural dysfunction, and pain.  REHAB POTENTIAL: Good  CLINICAL DECISION MAKING: Evolving/moderate complexity  EVALUATION COMPLEXITY: Low   GOALS: Goals reviewed with patient? Yes  SHORT TERM GOALS: Target date: 01/01/23  Independent with initial HEP Goal status: 12/27/22  MET  LONG TERM GOALS: Target date: 03/22/23  Decrease pain 50% Goal status: progressing 07/25/23  2.  Dress without difficulty Goal status: progressing 07/25/23  3.  Do hair without difficulty Goal status: able to reach the top of head at times progressing 2/20/253/25/25 4.  Increase AROM right shoulder flexion to 130 degrees Goal status: 07/25/23 progressing 130 degrees with pain  5.  Increase right shoulder ER to 60 degrees Goal status: met 07/06/23  6.  Return to water  aerobics and or gym activity Goal status:progressing met 06/01/23  PLAN:  PT FREQUENCY: 1-2x/week  PT DURATION: 12 weeks  PLANNED INTERVENTIONS: Therapeutic exercises, Therapeutic activity, Neuromuscular re-education, Balance training, Gait training, Patient/Family education, Self Care, Joint mobilization, Dry Needling, Electrical stimulation, Cryotherapy, Vasopneumatic device, and Manual therapy  PLAN FOR NEXT SESSION:   will resume PT on the right shoulder conservatively to try to get back lost ROM and help with pain until she has US , will evaluate left shoulder soon 09/27/2023, 2:44 PM [place] [facility] [place], [place], [address] Phone: [phone]   Fax:  [phone]

## 2023-09-29 ENCOUNTER — Encounter: Payer: Self-pay | Admitting: Physical Therapy

## 2023-09-29 ENCOUNTER — Ambulatory Visit: Admitting: Physical Therapy

## 2023-09-29 DIAGNOSIS — M25611 Stiffness of right shoulder, not elsewhere classified: Secondary | ICD-10-CM

## 2023-09-29 DIAGNOSIS — M25511 Pain in right shoulder: Secondary | ICD-10-CM | POA: Diagnosis not present

## 2023-09-29 DIAGNOSIS — M79642 Pain in left hand: Secondary | ICD-10-CM | POA: Diagnosis not present

## 2023-09-29 DIAGNOSIS — R278 Other lack of coordination: Secondary | ICD-10-CM | POA: Diagnosis not present

## 2023-09-29 DIAGNOSIS — R600 Localized edema: Secondary | ICD-10-CM | POA: Diagnosis not present

## 2023-09-29 DIAGNOSIS — G8929 Other chronic pain: Secondary | ICD-10-CM | POA: Diagnosis not present

## 2023-09-29 DIAGNOSIS — M6281 Muscle weakness (generalized): Secondary | ICD-10-CM

## 2023-09-29 DIAGNOSIS — M25641 Stiffness of right hand, not elsewhere classified: Secondary | ICD-10-CM | POA: Diagnosis not present

## 2023-09-29 DIAGNOSIS — M25642 Stiffness of left hand, not elsewhere classified: Secondary | ICD-10-CM | POA: Diagnosis not present

## 2023-09-29 DIAGNOSIS — R252 Cramp and spasm: Secondary | ICD-10-CM | POA: Diagnosis not present

## 2023-09-29 DIAGNOSIS — M542 Cervicalgia: Secondary | ICD-10-CM | POA: Diagnosis not present

## 2023-09-29 DIAGNOSIS — M79641 Pain in right hand: Secondary | ICD-10-CM | POA: Diagnosis not present

## 2023-09-29 NOTE — Therapy (Signed)
 OUTPATIENT PHYSICAL THERAPY SHOULDER    Patient Name: Veronica Galvan MRN: [id number] DOB:1955-10-11, 68 y.o., female Today's Date: 09/29/2023  END OF SESSION:  PT End of Session - 09/29/23 1135     Visit Number 69    Date for PT Re-Evaluation 10/01/23    Authorization Type [employer] [place]    PT Start Time 1056    PT Stop Time 1145    PT Time Calculation (min) 49 min    Activity Tolerance Patient tolerated treatment well    Behavior During Therapy WFL for tasks assessed/performed             Past Medical History:  Diagnosis Date   Allergy     Anxiety    Arthritis    hands   Cataract    Diabetes mellitus without complication (HCC)    type 2   Family history of adverse reaction to anesthesia    son has malignant hyperthermia, daughter does not daughter recently had c section without problems   GERD (gastroesophageal reflux disease)    Headache    sinus   Hyperlipidemia    Hypertension    PONV (postoperative nausea and vomiting)    nausea only   Ulcer, stomach peptic yrs ago   Past Surgical History:  Procedure Laterality Date   APPENDECTOMY     both hells bone spur repair     both heels with metal clips   both shoulder rotator cuff repair     CESAREAN SECTION     x 1   COLONOSCOPY WITH PROPOFOL  N/A 09/07/2016   Procedure: COLONOSCOPY WITH PROPOFOL ;  Surgeon: Garrett Kallman, MD;  Location: [facility];  Service: Endoscopy;  Laterality: N/A;   colonscopy  06/2011   polyps   ELBOW FRACTURE SURGERY Left    EYE SURGERY     FRACTURE SURGERY     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/10/2022   Procedure: REVERSE SHOULDER ARTHROPLASTY;  Surgeon: Ellard Gunning, MD;  Location: [facility];  Service: Orthopedics;  Laterality: Right;  Please follow in room 6 if able   TUBAL LIGATION     VESICO-VAGINAL FISTULA REPAIR     Patient Active Problem List   Diagnosis Date Noted   Gastroesophageal reflux disease 11/16/2021   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 08/18/2021   Dermatofibroma 08/18/2021   History of malignant neoplasm of skin 08/18/2021   Lentigo 08/18/2021   Melanocytic nevi of trunk 08/18/2021   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   Rosacea 08/18/2021   Actinic keratosis 08/18/2021   Sensorineural hearing loss (SNHL) of left ear with unrestricted hearing of right ear 07/26/2021   Tinnitus of left ear 07/26/2021   Acute recurrent maxillary sinusitis 06/22/2021   Primary hypertension 01/12/2021   Hyperlipidemia associated with type 2 diabetes mellitus (HCC) 01/12/2021   Arthritis 01/12/2021   Type II diabetes mellitus (HCC) 11/25/2019   Ingrown toenail 09/05/2018   Neck pain 01/29/2018   Tick bite 10/24/2017    PCP: Ike Malady, [name]  REFERRING PROVIDER: Alfredo Ano, MD  REFERRING DIAG: s/p right reverse TSA  THERAPY DIAG:  Muscle weakness (generalized)  Other lack of coordination  Chronic right shoulder pain  Stiffness of right shoulder, not elsewhere classified  Cervicalgia  Rationale for Evaluation and Treatment: Rehabilitation  ONSET DATE: 11/05/22  SUBJECTIVE:  SUBJECTIVE STATEMENT:   Still awaiting US  of the right shoulder on June 17th to see if there is loosening of the prosthesis.  She reports that the last treatment helped but  Patient saw sports med MD, got an injection in the left shoulder , She got good relief of the left shoulder pain has an US  scheduled for the right shoulder June 17th.  Reports that the pain in the left is much improved with better ROM, still very painful in the right  PAIN:  Are you having pain? Yes: NPRS scale: 6/10 Pain location: right shoulder, HA Pain description: dull ache at rest, sharp with motions Aggravating factors: quick motions, movements pain up to 10/10 Relieving factors:  ice, rest, Tylenol  at best 3/10  PRECAUTIONS: Shoulder Protocol in the chart  RED FLAGS: None   WEIGHT BEARING RESTRICTIONS: No  FALLS:  Has patient fallen in last 6 months? Yes. Number of falls 1  LIVING ENVIRONMENT: Lives with: lives alone Lives in: House/apartment Stairs: No Has following equipment at home: None  OCCUPATION: retired  PLOF: Independent  PATIENT GOALS:dress without difficulty, do hair, have good ROM and less pain  NEXT MD VISIT:   OBJECTIVE:   DIAGNOSTIC FINDINGS:  See above  PATIENT SURVEYS:  FOTO 21  COGNITION: Overall cognitive status: Within functional limits for tasks assessed     SENSATION: WFL  POSTURE: Fwd head, rounded shoulders, elevated and guarded shoulder  UPPER EXTREMITY ROM:   Active ROM Right PROM eval Left AROM  08/31/23 PROM Supine 01/05/23 PROM  01/26/23 PROM 02/09/23 AROM sitting 02/14/23 AROM 02/21/23 AROM  03/02/23 AROM  03/22/23 AAROM 03/28/23 AROM Standing 04/13/23 AROM Standing 05/02/23 AROM  Standing 05/11/23 AROM  Standing 05/25/23 AROM Standing 06/01/23 AROM Standing  07/06/23 AROM  Standing 07/27/23 AROM Standing 08/31/23  Shoulder flexion 35 90 118  123 90 96 95 100 111 110 112 120 125  129 132 130 105  Shoulder extension 5                   Shoulder abduction 20 95 90 92  73 80 83 90 93 90 92 98 100 102  105 95 81  Shoulder adduction                    Shoulder internal rotation 15 35            40 40  42 25  Shoulder external rotation 20 65 45 30  41  45 50 53 56 56 60 66 67 70 70  47  Elbow flexion 120                   Elbow extension 5                   Wrist flexion                    Wrist extension                    Wrist ulnar deviation                    Wrist radial deviation                    Wrist pronation                    Wrist supination                    (  Blank rows = not tested)  UPPER EXTREMITY MMT:  No tested due to recent surgery  MMT Right eval Left eval  Shoulder  flexion    Shoulder extension    Shoulder abduction    Shoulder adduction    Shoulder internal rotation    Shoulder external rotation    Middle trapezius    Lower trapezius    Elbow flexion    Elbow extension    Wrist flexion    Wrist extension    Wrist ulnar deviation    Wrist radial deviation    Wrist pronation    Wrist supination    Grip strength (lbs)    (Blank rows = not tested)  PALPATION:  Very tight and tender in the pectoral, upper trap, the entire right upper arm   TODAY'S TREATMENT:                                                                                                                                         DATE:  09/29/23 STM to the right upper trap, MFR to the upper trap knots Passive stretch to the right shoulder Vaso medium pressure right shoulder 34 degrees   09/27/23 STM to the right upper trap, the rhomboid and the cervical area Gentle PROM of the right shoulder to her tolerance Vaso medium pressure 34 degrees  09/19/23 Passive stretch of the right and left shoulder STM to the upper traps, neck, rhomboids and upper arms Vaso to the right shoulder  09/13/23 AROM of the left shoulder today was much improved 110 degrees  Walking with encouragement of normal arm swing and no elevation of the shoulders PROM bilateral shoulders STM to the right upper arm, right upper trap and tight neck Vaso medium pressure  09/07/23 Reviewed US  of the left shoulder and x-rays as patient had questions PROM bilateral shoulders STM to the upper traps, rhomboids, cervical area Vaso to bilateral shoulders medium pressure   09/05/23 Nustep level 3 x 5 minutes UBE level 2 x 3 minutes US  to the left shoulder/upper arm STM to the neck and upper traps Passive stretch shoulders She then went to OT and after I put her on the VASO  08/31/23 Discussion as noted below, measured both shoulder AROM in standing as noted above STM to the right upper arm and shoulder Passive  stretch of the right shoulder to her point of pain Vaso bilateral shoulders for pain medium pressure   08/03/23 Pateint had a lot of questions regarding the bloodwork and the bone scan and what they are looking for, I strongly advised her to follow the MD/PA orders to wear sling and not use the arm, I did my best to answer her questions.  Finished with Vaso medium pressure 34 degrees  07/27/23 Gentle shoulder distractions Gentle PROM to her tolerance, a lot of cues to relax Neural tension stretches of the right UE STM to  the upper trap, neck and rhomboid STM to the biceps, deltoid and pectoral area  07/25/23 UBE level 3 x 4 minutes for mm perfusion Lats 15# too painful Seated row 10# Doorway stretch PROM right shoulder all motions to tolerance STM to the tight upper arm and the right pectoral area  07/20/23 UBE level 3 x 4 minutes Ball vs wall Rolling ball up wall Small weighted ball toss 2 ways and then with her doing ER Passive stretch right shoulder STM to the right shoulder and upper arm  07/18/23 Passive stretch right shoulder Gentle joint mobs  UE neural tension stretches STM to the right pectoral, right biceps, deltoid area Wall slides Wall circles Doorway stretch  PATIENT EDUCATION: Education details: poc/hep Person educated: Patient Education method: Programmer, multimedia, Facilities manager, Actor cues, Verbal cues, and Handouts Education comprehension: verbalized understanding  HOME EXERCISE PROGRAM: Access Code: [email or web address] URL: [email or web address] Date: 12/20/2022 Prepared by: Cherylene Corrente  Exercises - Seated Shoulder Flexion Towel Slide at Table Top  - 2 x daily - 7 x weekly - 2 sets - 10 reps - 3 hold - Seated Shoulder External Rotation PROM on Table  - 2 x daily - 7 x weekly - 2 sets - 10 reps - 3 hold - Seated Elbow Extension and Shoulder External Rotation AAROM at Table with Towel  - 2 x daily - 7 x weekly - 2 sets - 10 reps - 3 hold - Circular  Shoulder Pendulum with Table Support  - 2 x daily - 7 x weekly - 2 sets - 10 reps - 3 hold  Access Code: [email or web address] URL: [email or web address] Date: 07/13/2023 Prepared by: Cherylene Corrente  Exercises - Isometric Shoulder Flexion at Wall  - 1 x daily - 7 x weekly - 1 sets - 10 reps - 3 hold - Standing Isometric Shoulder Internal Rotation at Doorway  - 1 x daily - 7 x weekly - 1 sets - 10 reps - 3 hold - Isometric Shoulder Extension at Wall  - 1 x daily - 7 x weekly - 1 sets - 10 reps - 3 hold - Isometric Shoulder Abduction at Wall  - 1 x daily - 7 x weekly - 1 sets - 10 reps - 3 hold - Standing Isometric Shoulder External Rotation with Doorway  - 1 x daily - 7 x weekly - 1 sets - 10 reps - 3 hold  ASSESSMENT:  CLINICAL IMPRESSION:  Patient is awaiting the US  to see if the prosthesis is in place and snug and without issue, I am still being conservative and working on spasms and ROM.  Has some significant knots in the right upper trap. PROM is pretty good but does need a lot of cues to relax   Patient saw surgeon, the bone scan said did not feel that there was infection or loosening however the surgeon is not so sure, he wants to do an US  to be sure, that is scheduled for the next few weeks, since she has not been to PT in a month I re-measured her ROM, she has lost at least 15 degrees of motion and more for all motions.  She is also having more pain in the left shoulder possibly from overdoing it, because she is trying to not use the right shoulder.  She has talked about this with her primary and I suggested that she see a sports medicine MD, she called while she was here and will have appointment with him next week.  As far as the right  shoulder the Surgeon stated in his notes to resume formal PT on a conservative basis to help with ROM and pain until the US  is performed   OBJECTIVE IMPAIRMENTS: cardiopulmonary status limiting activity, decreased activity tolerance, decreased  endurance, decreased ROM, decreased strength, increased edema, increased muscle spasms, impaired flexibility, impaired UE functional use, improper body mechanics, postural dysfunction, and pain.  REHAB POTENTIAL: Good  CLINICAL DECISION MAKING: Evolving/moderate complexity  EVALUATION COMPLEXITY: Low   GOALS: Goals reviewed with patient? Yes  SHORT TERM GOALS: Target date: 01/01/23  Independent with initial HEP Goal status: 12/27/22 MET  LONG TERM GOALS: Target date: 03/22/23  Decrease pain 50% Goal status: progressing 07/25/23  2.  Dress without difficulty Goal status: progressing 07/25/23  3.  Do hair without difficulty Goal status: able to reach the top of head at times progressing 2/20/253/25/25 4.  Increase AROM right shoulder flexion to 130 degrees Goal status: 07/25/23 progressing 130 degrees with pain  5.  Increase right shoulder ER to 60 degrees Goal status: met 07/06/23  6.  Return to water  aerobics and or gym activity Goal status:progressing met 06/01/23  PLAN:  PT FREQUENCY: 1-2x/week  PT DURATION: 12 weeks  PLANNED INTERVENTIONS: Therapeutic exercises, Therapeutic activity, Neuromuscular re-education, Balance training, Gait training, Patient/Family education, Self Care, Joint mobilization, Dry Needling, Electrical stimulation, Cryotherapy, Vasopneumatic device, and Manual therapy  PLAN FOR NEXT SESSION:   will resume PT on the right shoulder conservatively to try to get back lost ROM and help with pain until she has US , will evaluate left shoulder soon 09/29/2023, 11:41 AM [place] [facility] Rehabilitation at [facility]. [place], [place], [address] Phone: [phone]   Fax:  [phone]

## 2023-10-03 ENCOUNTER — Ambulatory Visit: Attending: Family | Admitting: Occupational Therapy

## 2023-10-03 ENCOUNTER — Encounter: Payer: Self-pay | Admitting: Physical Therapy

## 2023-10-03 ENCOUNTER — Ambulatory Visit: Admitting: Physical Therapy

## 2023-10-03 DIAGNOSIS — M542 Cervicalgia: Secondary | ICD-10-CM

## 2023-10-03 DIAGNOSIS — R278 Other lack of coordination: Secondary | ICD-10-CM

## 2023-10-03 DIAGNOSIS — M25511 Pain in right shoulder: Secondary | ICD-10-CM | POA: Diagnosis not present

## 2023-10-03 DIAGNOSIS — M79642 Pain in left hand: Secondary | ICD-10-CM | POA: Insufficient documentation

## 2023-10-03 DIAGNOSIS — M25641 Stiffness of right hand, not elsewhere classified: Secondary | ICD-10-CM | POA: Insufficient documentation

## 2023-10-03 DIAGNOSIS — M6281 Muscle weakness (generalized): Secondary | ICD-10-CM | POA: Diagnosis not present

## 2023-10-03 DIAGNOSIS — M25611 Stiffness of right shoulder, not elsewhere classified: Secondary | ICD-10-CM | POA: Diagnosis not present

## 2023-10-03 DIAGNOSIS — M79641 Pain in right hand: Secondary | ICD-10-CM | POA: Diagnosis not present

## 2023-10-03 DIAGNOSIS — R252 Cramp and spasm: Secondary | ICD-10-CM | POA: Diagnosis not present

## 2023-10-03 DIAGNOSIS — G8929 Other chronic pain: Secondary | ICD-10-CM | POA: Insufficient documentation

## 2023-10-03 DIAGNOSIS — M25642 Stiffness of left hand, not elsewhere classified: Secondary | ICD-10-CM | POA: Diagnosis not present

## 2023-10-03 NOTE — Therapy (Signed)
 OUTPATIENT PHYSICAL THERAPY SHOULDER    Patient Name: Veronica Galvan MRN: [id number] DOB:07/29/1955, 68 y.o., female Today's Date: 10/03/2023  END OF SESSION:  PT End of Session - 10/03/23 0931     Visit Number 70    Date for PT Re-Evaluation 11/02/23    Authorization Type [employer] [place]    PT Start Time 0930    PT Stop Time 1030    PT Time Calculation (min) 60 min    Activity Tolerance Patient tolerated treatment well    Behavior During Therapy WFL for tasks assessed/performed             Past Medical History:  Diagnosis Date   Allergy     Anxiety    Arthritis    hands   Cataract    Diabetes mellitus without complication (HCC)    type 2   Family history of adverse reaction to anesthesia    son has malignant hyperthermia, daughter does not daughter recently had c section without problems   GERD (gastroesophageal reflux disease)    Headache    sinus   Hyperlipidemia    Hypertension    PONV (postoperative nausea and vomiting)    nausea only   Ulcer, stomach peptic yrs ago   Past Surgical History:  Procedure Laterality Date   APPENDECTOMY     both hells bone spur repair     both heels with metal clips   both shoulder rotator cuff repair     CESAREAN SECTION     x 1   COLONOSCOPY WITH PROPOFOL  N/A 09/07/2016   Procedure: COLONOSCOPY WITH PROPOFOL ;  Surgeon: Garrett Kallman, MD;  Location: [facility];  Service: Endoscopy;  Laterality: N/A;   colonscopy  06/2011   polyps   ELBOW FRACTURE SURGERY Left    EYE SURGERY     FRACTURE SURGERY     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/10/2022   Procedure: REVERSE SHOULDER ARTHROPLASTY;  Surgeon: Ellard Gunning, MD;  Location: [facility];  Service: Orthopedics;  Laterality: Right;  Please follow in room 6 if able   TUBAL LIGATION     VESICO-VAGINAL FISTULA REPAIR     Patient Active Problem List   Diagnosis Date Noted   Gastroesophageal reflux disease 11/16/2021   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 08/18/2021   Dermatofibroma 08/18/2021   History of malignant neoplasm of skin 08/18/2021   Lentigo 08/18/2021   Melanocytic nevi of trunk 08/18/2021   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   Rosacea 08/18/2021   Actinic keratosis 08/18/2021   Sensorineural hearing loss (SNHL) of left ear with unrestricted hearing of right ear 07/26/2021   Tinnitus of left ear 07/26/2021   Acute recurrent maxillary sinusitis 06/22/2021   Primary hypertension 01/12/2021   Hyperlipidemia associated with type 2 diabetes mellitus (HCC) 01/12/2021   Arthritis 01/12/2021   Type II diabetes mellitus (HCC) 11/25/2019   Ingrown toenail 09/05/2018   Neck pain 01/29/2018   Tick bite 10/24/2017    PCP: Ike Malady, [name]  REFERRING PROVIDER: Alfredo Ano, MD  REFERRING DIAG: s/p right reverse TSA  THERAPY DIAG:  Muscle weakness (generalized)  Other lack of coordination  Chronic right shoulder pain  Stiffness of right shoulder, not elsewhere classified  Cervicalgia  Rationale for Evaluation and Treatment: Rehabilitation  ONSET DATE: 11/05/22  SUBJECTIVE:  SUBJECTIVE STATEMENT:   Still awaiting US  of the right shoulder on June 17th to see if there is loosening of the prosthesis.  She reports that the last treatment helped but still with pain and some warmth of the right upper arm Patient saw sports med MD, got an injection in the left shoulder , She got good relief of the left shoulder pain has an US  scheduled for the right shoulder June 17th.  Reports that the pain in the left is much improved with better ROM, still very painful in the right  PAIN:  Are you having pain? Yes: NPRS scale: 6/10 Pain location: right shoulder, HA Pain description: dull ache at rest, sharp with motions Aggravating factors: quick  motions, movements pain up to 10/10 Relieving factors: ice, rest, Tylenol  at best 3/10  PRECAUTIONS: Shoulder Protocol in the chart  RED FLAGS: None   WEIGHT BEARING RESTRICTIONS: No  FALLS:  Has patient fallen in last 6 months? Yes. Number of falls 1  LIVING ENVIRONMENT: Lives with: lives alone Lives in: House/apartment Stairs: No Has following equipment at home: None  OCCUPATION: retired  PLOF: Independent  PATIENT GOALS:dress without difficulty, do hair, have good ROM and less pain  NEXT MD VISIT:   OBJECTIVE:   DIAGNOSTIC FINDINGS:  See above  PATIENT SURVEYS:  FOTO 21  COGNITION: Overall cognitive status: Within functional limits for tasks assessed     SENSATION: WFL  POSTURE: Fwd head, rounded shoulders, elevated and guarded shoulder  UPPER EXTREMITY ROM:   Active ROM Right PROM eval Left AROM  08/31/23 PROM Supine 01/05/23 PROM  01/26/23 PROM 02/09/23 AROM sitting 02/14/23 AROM 02/21/23 AROM  03/02/23 AROM  03/22/23 AAROM 03/28/23 AROM Standing 04/13/23 AROM Standing 05/02/23 AROM  Standing 05/11/23 AROM  Standing 05/25/23 AROM Standing 06/01/23 AROM Standing  07/06/23 AROM  Standing 07/27/23 AROM Standing 08/31/23 AROM Standing  10/03/23  Shoulder flexion 35 90 118  123 90 96 95 100 111 110 112 120 125  129 132 130 105 115  Shoulder extension 5                    Shoulder abduction 20 95 90 92  73 80 83 90 93 90 92 98 100 102  105 95 81 90  Shoulder adduction                     Shoulder internal rotation 15 35            40 40  42 25   Shoulder external rotation 20 65 45 30  41  45 50 53 56 56 60 66 67 70 70  47 60  Elbow flexion 120                    Elbow extension 5                    Wrist flexion                     Wrist extension                     Wrist ulnar deviation                     Wrist radial deviation                     Wrist pronation  Wrist supination                     (Blank rows = not  tested)  UPPER EXTREMITY MMT:  No tested due to recent surgery  MMT Right eval Left eval  Shoulder flexion    Shoulder extension    Shoulder abduction    Shoulder adduction    Shoulder internal rotation    Shoulder external rotation    Middle trapezius    Lower trapezius    Elbow flexion    Elbow extension    Wrist flexion    Wrist extension    Wrist ulnar deviation    Wrist radial deviation    Wrist pronation    Wrist supination    Grip strength (lbs)    (Blank rows = not tested)  PALPATION:  Very tight and tender in the pectoral, upper trap, the entire right upper arm   TODAY'S TREATMENT:                                                                                                                                         DATE:  10/03/23 UBE level 3 x 6 minutes Supine wand chest press Supine wand flexion Passive stretch right shoulder  STM to the right upper arm and pectoral area Vaso 34 degrees right shoulder ROM measured as above  09/29/23 STM to the right upper trap, MFR to the upper trap knots Passive stretch to the right shoulder Vaso medium pressure right shoulder 34 degrees  09/27/23 STM to the right upper trap, the rhomboid and the cervical area Gentle PROM of the right shoulder to her tolerance Vaso medium pressure 34 degrees  09/19/23 Passive stretch of the right and left shoulder STM to the upper traps, neck, rhomboids and upper arms Vaso to the right shoulder  09/13/23 AROM of the left shoulder today was much improved 110 degrees  Walking with encouragement of normal arm swing and no elevation of the shoulders PROM bilateral shoulders STM to the right upper arm, right upper trap and tight neck Vaso medium pressure  09/07/23 Reviewed US  of the left shoulder and x-rays as patient had questions PROM bilateral shoulders STM to the upper traps, rhomboids, cervical area Vaso to bilateral shoulders medium pressure   09/05/23 Nustep level 3 x 5  minutes UBE level 2 x 3 minutes US  to the left shoulder/upper arm STM to the neck and upper traps Passive stretch shoulders She then went to OT and after I put her on the VASO  08/31/23 Discussion as noted below, measured both shoulder AROM in standing as noted above STM to the right upper arm and shoulder Passive stretch of the right shoulder to her point of pain Vaso bilateral shoulders for pain medium pressure   08/03/23 Pateint had a lot of questions regarding the bloodwork and the bone scan and what they are looking  for, I strongly advised her to follow the MD/PA orders to wear sling and not use the arm, I did my best to answer her questions.  Finished with Vaso medium pressure 34 degrees  07/27/23 Gentle shoulder distractions Gentle PROM to her tolerance, a lot of cues to relax Neural tension stretches of the right UE STM to the upper trap, neck and rhomboid STM to the biceps, deltoid and pectoral area  07/25/23 UBE level 3 x 4 minutes for mm perfusion Lats 15# too painful Seated row 10# Doorway stretch PROM right shoulder all motions to tolerance STM to the tight upper arm and the right pectoral area  07/20/23 UBE level 3 x 4 minutes Ball vs wall Rolling ball up wall Small weighted ball toss 2 ways and then with her doing ER Passive stretch right shoulder STM to the right shoulder and upper arm  07/18/23 Passive stretch right shoulder Gentle joint mobs  UE neural tension stretches STM to the right pectoral, right biceps, deltoid area Wall slides Wall circles Doorway stretch  PATIENT EDUCATION: Education details: poc/hep Person educated: Patient Education method: Programmer, multimedia, Facilities manager, Actor cues, Verbal cues, and Handouts Education comprehension: verbalized understanding  HOME EXERCISE PROGRAM: Access Code: [email or web address] URL: [email or web address] Date: 12/20/2022 Prepared by: Cherylene Corrente  Exercises - Seated Shoulder Flexion Towel  Slide at Table Top  - 2 x daily - 7 x weekly - 2 sets - 10 reps - 3 hold - Seated Shoulder External Rotation PROM on Table  - 2 x daily - 7 x weekly - 2 sets - 10 reps - 3 hold - Seated Elbow Extension and Shoulder External Rotation AAROM at Table with Towel  - 2 x daily - 7 x weekly - 2 sets - 10 reps - 3 hold - Circular Shoulder Pendulum with Table Support  - 2 x daily - 7 x weekly - 2 sets - 10 reps - 3 hold  Access Code: [email or web address] URL: [email or web address] Date: 07/13/2023 Prepared by: Cherylene Corrente  Exercises - Isometric Shoulder Flexion at Wall  - 1 x daily - 7 x weekly - 1 sets - 10 reps - 3 hold - Standing Isometric Shoulder Internal Rotation at Doorway  - 1 x daily - 7 x weekly - 1 sets - 10 reps - 3 hold - Isometric Shoulder Extension at Wall  - 1 x daily - 7 x weekly - 1 sets - 10 reps - 3 hold - Isometric Shoulder Abduction at Wall  - 1 x daily - 7 x weekly - 1 sets - 10 reps - 3 hold - Standing Isometric Shoulder External Rotation with Doorway  - 1 x daily - 7 x weekly - 1 sets - 10 reps - 3 hold  ASSESSMENT:  CLINICAL IMPRESSION:  Patient is awaiting the US  to see if the prosthesis is in place and snug and without issue, I am still being conservative and working on spasms and ROM.  I worked on the tightness of the pectoral area and the right upper arm, she is improving with her AROM but still not back to the place we were in January and up to March.   Patient saw surgeon, the bone scan said did not feel that there was infection or loosening however the surgeon is not so sure, he wants to do an US  to be sure, that is scheduled for the next few weeks, since she has not been to PT in a month I re-measured her ROM, she has lost at least 15  degrees of motion and more for all motions.  She is also having more pain in the left shoulder possibly from overdoing it, because she is trying to not use the right shoulder.  She has talked about this with her primary and I  suggested that she see a sports medicine MD, she called while she was here and will have appointment with him next week.  As far as the right shoulder the Surgeon stated in his notes to resume formal PT on a conservative basis to help with ROM and pain until the US  is performed   OBJECTIVE IMPAIRMENTS: cardiopulmonary status limiting activity, decreased activity tolerance, decreased endurance, decreased ROM, decreased strength, increased edema, increased muscle spasms, impaired flexibility, impaired UE functional use, improper body mechanics, postural dysfunction, and pain.  REHAB POTENTIAL: Good  CLINICAL DECISION MAKING: Evolving/moderate complexity  EVALUATION COMPLEXITY: Low   GOALS: Goals reviewed with patient? Yes  SHORT TERM GOALS: Target date: 01/01/23  Independent with initial HEP Goal status: 12/27/22 MET  LONG TERM GOALS: Target date: 03/22/23  Decrease pain 50% Goal status: progressing 10/03/23  2.  Dress without difficulty Goal status: progressing 10/03/23  3.  Do hair without difficulty Goal status: able to reach the top of head at times progressing 2/20/253/25/25 4.  Increase AROM right shoulder flexion to 130 degrees Goal status: 07/25/23 progressing 130 degrees with pain, since March has regressed but is better since march6/3/25  5.  Increase right shoulder ER to 60 degrees Goal status: met 07/06/23  6.  Return to water  aerobics and or gym activity Goal status:progressing met 06/01/23  PLAN:  PT FREQUENCY: 1-2x/week  PT DURATION: 12 weeks  PLANNED INTERVENTIONS: Therapeutic exercises, Therapeutic activity, Neuromuscular re-education, Balance training, Gait training, Patient/Family education, Self Care, Joint mobilization, Dry Needling, Electrical stimulation, Cryotherapy, Vasopneumatic device, and Manual therapy  PLAN FOR NEXT SESSION:   will resume PT on the right shoulder conservatively to try to get back lost ROM and help with pain until she has US , will  evaluate left shoulder soon 10/03/2023, 9:32 AM [facility] [address] [facility] [place], [place], [address] Phone: [phone]   Fax:  [phone]

## 2023-10-03 NOTE — Therapy (Signed)
 OUTPATIENT OCCUPATIONAL THERAPY ORTHO Treatment  Patient Name: Veronica Galvan MRN: [id number] DOB:[birth year], 68 y.o., female Today's Date: 10/03/2023  PCP: Adra Alanis [name] REFERRING PROVIDER: Adra Alanis [name]  END OF SESSION:  OT End of Session - 10/03/23 0947     Visit Number 26    Number of Visits 41    Date for OT Re-Evaluation 11/07/23    Authorization Type [employer] M[employer]    Authorization Time Period 12 weeks    Authorization - Visit Number 26    Progress Note Due on Visit 26    OT Start Time 0845    OT Stop Time 0930    OT Time Calculation (min) 45 min    Activity Tolerance Patient tolerated treatment well    Behavior During Therapy WFL for tasks assessed/performed                          Past Medical History:  Diagnosis Date   Allergy     Anxiety    Arthritis    hands   Cataract    Diabetes mellitus without complication (HCC)    type 2   Family history of adverse reaction to anesthesia    son has malignant hyperthermia, daughter does not daughter recently had c section without problems   GERD (gastroesophageal reflux disease)    Headache    sinus   Hyperlipidemia    Hypertension    PONV (postoperative nausea and vomiting)    nausea only   Ulcer, stomach peptic yrs ago   Past Surgical History:  Procedure Laterality Date   APPENDECTOMY     both hells bone spur repair     both heels with metal clips   both shoulder rotator cuff repair     CESAREAN SECTION     x 1   COLONOSCOPY WITH PROPOFOL  N/A 09/07/2016   Procedure: COLONOSCOPY WITH PROPOFOL ;  Surgeon: Garrett Kallman, MD;  Location: [facility];  Service: Endoscopy;  Laterality: N/A;   colonscopy  06/2011   polyps   ELBOW FRACTURE SURGERY Left    EYE SURGERY     FRACTURE SURGERY     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/10/2022   Procedure: REVERSE SHOULDER ARTHROPLASTY;  Surgeon: Ellard Gunning, MD;  Location: [facility];  Service: Orthopedics;  Laterality: Right;   Please follow in room 6 if able   TUBAL LIGATION     VESICO-VAGINAL FISTULA REPAIR     Patient Active Problem List   Diagnosis Date Noted   Gastroesophageal reflux disease 11/16/2021   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 08/18/2021   Dermatofibroma 08/18/2021   History of malignant neoplasm of skin 08/18/2021   Lentigo 08/18/2021   Melanocytic nevi of trunk 08/18/2021   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   Rosacea 08/18/2021   Actinic keratosis 08/18/2021   Sensorineural hearing loss (SNHL) of left ear with unrestricted hearing of right ear 07/26/2021   Tinnitus of left ear 07/26/2021   Acute recurrent maxillary sinusitis 06/22/2021   Primary hypertension 01/12/2021   Hyperlipidemia associated with type 2 diabetes mellitus (HCC) 01/12/2021   Arthritis 01/12/2021   Type II diabetes mellitus (HCC) 11/25/2019   Ingrown toenail 09/05/2018   Neck pain 01/29/2018   Tick bite 10/24/2017    ONSET DATE: 05/19/23  REFERRING DIAG:  Diagnosis  M79.641,M79.642 (ICD-10-CM) - Bilateral hand pain    THERAPY DIAG:  No diagnosis found.  Rationale for Evaluation and Treatment: Rehabilitation  SUBJECTIVE:   SUBJECTIVE STATEMENT: Pt reports  she was able to make her bed so her hands are better. 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 Yes: NPRS scale: 7/10- right hand Pain location: right hand,  3/10 left hand Pain description: aching, stiff Aggravating factors: gripping  Relieving factors: heat, meds Pt also has shoulder pain which is more significant grossly R shoulder 8/10 L shoulder pain 3/10 which OT will not address. PT is addressing shoulder FALLS: Has  patient fallen in last 6 months? No  LIVING ENVIRONMENT: Lives with: lives alone Lives in: House/apartment Stairs: yes   PLOF: Independent  PATIENT GOALS: improve functional use of hands and learn adapted strategies for ADLs/IADLs.  NEXT MD VISIT: unknown  OBJECTIVE:  Note: Objective measures were completed at Evaluation unless otherwise noted.  HAND DOMINANCE: Right  ADLs: Overall ADLs: unable to grip credit card to remove from ATM Transfers/ambulation related to ADLs: mod I Eating: drops silverware Grooming: drops toothbrush, uses electric toothbrush, holding comb Upper body dressing: adjusting bra Lower body dressing: difficulty pulling up pants  Toileting: hygeine was difficult initally Bathing: mod I, difficult with shaving Tub shower transfers: walk in shower    FUNCTIONAL OUTCOME MEASURES: Quick Dash: 05/30/23- 75%% disability Quick Dash 08/15/23- 70.5% disability   UPPER EXTREMITY ROM:   RUE wrist flexion/ extension: 70/ 50, LUE wrist flexion/ extension: 70/ 45 Pt demonstrates grossly 50% composite flexion for right and 555 with left.   Active ROM Right eval Left eval  Thumb MCP (0-60) 45 55  Thumb IP (0-80) 25 10  Thumb Radial abd/add (0-55)     Thumb Palmar abd/add (0-45)     Thumb Opposition to Small Finger yes yes   Index MCP (0-90)     Index PIP (0-100)     Index DIP (0-70)      Long MCP (0-90)      Long PIP (0-100)      Long DIP (0-70)      Ring MCP (0-90)      Ring PIP (0-100)      Ring DIP (0-70)      Little MCP (0-90)      Little PIP (0-100)      Little DIP (0-70)      (Blank rows = not tested) 9 hole peg test (08/15/23) RUE 26.70 secs, LUE 23 secs  HAND FUNCTION: Grip strength: Right: 8 lbs; Left: 4 lbs,          08/15/23-  RUE 18, LUE 14 lbs,           08/15/23   Pinch: RUE tip 4lbs, lateral 8 lbs, LUE tip 3 lbs, lateral 8 lbs   SENSATION: Light touch: Impaired index finger LUE  EDEMA: Pt with bony defomities at PIP joints of all  digits which pt reports are bone spurs, Pt also has bony deformity at DIP for right thumb, index and 5th digit and LUE small and index fingers  COGNITION: Overall cognitive status: Within functional limits for tasks assessed   OBSERVATIONS: Pleasant female desires to increase functional use of bilateral UE's  , TREATMENT DATE:10/03/23 Paraffin to RUE x 10 mins, no adverse reactions, while therapist performed US  to left hand 3.3 mhz, 0.8 w/cm  2,  20%x 8 mins no adverse reactions for stiffness. Paraffin to LUE x 10 mins, no adverse reactions, while therapist performed US  to right hand 3.3 mhz, 0.8 w/cm 2,  20%x 8 mins no adverse reactions for stiffness.  Passive composite finger flexion to right hand and then individual finger flexion to left hand . Gentle soft tissue and joint mobs/ distraction to digits, hand, wrist bilaterally as welll as right foream, pt reports feeling looser Resistive gripping with  tree squeeze ball left and right UE's digi flex, light resistance for composite and individual finger flexion for LUE  5/28/25Paraffin to RUE x 10 mins, no adverse reactions, while therapist performed US  to left hand 3.3 mhz, 0.8 w/cm 2,  20%x 8 mins no adverse reactions for stiffness. Paraffin to LUE x 10 mins, no adverse reactions, while therapist performed US  to right hand 3.3 mhz, 0.8 w/cm 2,  20%x 8 mins no adverse reactions for stiffness.  Passive composite finger flexion to right hand and then individual finger flexion to left hand . Resistive gripping with  tree squeeze ball left and right UE's Graded clothespins 1-8# for sustained pinch Placing large pegs into pegboard with left and right UE's for increased functional use, min difficulty/ v.c   5/20/25Paraffin to RUE x 10 mins, no adverse reactions, while therapist performed US  to LUE 3.3 mhz, 0.8 w/cm 2,  20%x 8 mins no adverse reactions for stiffness. Paraffin to LUE x 10 mins, no adverse reactions, while therapist performed US  to  RUE 3.3 mhz, 0.8 w/cm 2,  20%x 8 mins no adverse reactions for stiffness.  Passive composite finger flexion, and individual finger flexion with exception of right middle finger due to pain. Place and holds perfromed. Composite finger flexion/ extension with washcloth  x 10 reps followed by followed by resistive gripping of squeeze ball, then individual pinch with each digit for LUE.  PATIENT EDUCATION: Education details:   see above Person educated: Patient Education method: Explanation, demonstration, v.c Education comprehension: verbalized understanding, returned demonstration  HOME EXERCISE PROGRAM: AROM yellow putty  GOALS: Goals reviewed with patient? Yes  SHORT TERM GOALS: Target date: 09/14/23  I with HEP  Goal status:  met 06/07/23  2.  I with positioning/ splinting prn to minimize pain and defomity   Goal status: met, 07/13/23  3.  Pt will increase bilateral grip strength by 5 lbs for increased UE functional use. Upgraded goal Pt will increase bilateral grip strength to at least 25 lbs (after stretching). Baseline: 05/30/23- RUE 8 lbs, LUE 4 lbs Goal status:met 08/01/23-R 20 lbs, L  19 lbs,  08/15/23- RUE 18 lbsLUE 14 lbs continue to address upgraded goal  - RUE 19 lbs, LUE 24- 10/03/23  4. Pt will demonstrate improved RUE fine motor coordination as evidenced by perfroming 9 hole peg test in 23 secs or less.  Goal status: ongoing 10/03/23 5. I with updates to HEP.  Goal status: ongoing 10/03/23 6. Pt will demonstrate improved composite finger flexion for ADLs as evidenced by pt. bringing middle fingertip for RUE within 1/2 an inch of palm.  Baseline: 3/4 inch from palm to middle fingertip  Goal status : ongoing, 3/4 inch 09/19/23 7.Pt will demonstrate improved composite finger flexion for ADLs as evidenced by pt. bringing middle fingertip for LUE within 3/4 an inch of palm.  Baseline: 1 inch from palm to middle finger tip  Goal status: met, 3/4 inch away from middle finger  09/19/23  LONG TERM GOALS: Target date:11/07/23  I  with updated HEP  Goal status:met 08/15/22  2.  Pt will improve Quick Dash score to 70% disability Baseline: 75% (05/30/23) Goal status:   improved - however not fully met due to complications from shoulder pain-70.5%08/15/23  3.  Pt will demonstrate at least 65% composte flexion for LUE for increased functional use.  Goal status: met 65-70% , 08/15/23  4.  I with adapted strategies/ adapted equipment to minimize pain and to increase pt I with ADLs/IADLs  Goal status:  met for inital strategies, however continue goal as pt may report additional tasks that can benefit from modification.ongoing 10/03/23  5.  Pt will demonstrate at least 65% composite flexion for RUE for increased functional use.  Goal status: met 70% 08/15/23  6. Pt will report that she is able to cut her meat such as steak or chicken modified independently.  [place]: unable  Goal status: met 09/21/23    7. Pt will report increased ease with opening/ closing ziplock bags.    Goal status: met 09/21/23    8. Pt will improve bilateral tip pinch by 2 lbs for increased ease with daily activities.     Goal status: ongoing 09/19/23    9. Pt will improve bilateral lateral pinch by 2 lbs for increased ease with ADLs.    Goal status: ongoing 09/19/23   ASSESSMENT: CLINICAL IMPRESSION: For the reporting period of 08/15/23- 10/03/23 Pt is progressing towards goals. She achieved short term goal #7 and she is progressing towards all remaining goals.  She demonstrates continued improving grip strength in her LUE which is now 24#. Pt reports her hands are doing better and she was able to make her bed.Pt can benefit from continued skilled occupational therapy to address ROM, strength, pain and functional use in bilateral UE's in order to maximize pt's safety and I with ADLS/IADLs.PERFORMANCE DEFICITS: in functional skills including ADLs, IADLs, coordination, sensation, edema, ROM, strength, pain,  flexibility, Fine motor control, Gross motor control, endurance, decreased knowledge of precautions, decreased knowledge of use of DME, and UE functional use,, and psychosocial skills including coping strategies, environmental adaptation, habits, interpersonal interactions, and routines and behaviors.   IMPAIRMENTS: are limiting patient from ADLs, IADLs, rest and sleep, education, play, leisure, and social participation.   COMORBIDITIES: may have co-morbidities  that affects occupational performance. Patient will benefit from skilled OT to address above impairments and improve overall function.  MODIFICATION OR ASSISTANCE TO COMPLETE EVALUATION: No modification of tasks or assist necessary to complete an evaluation.  OT OCCUPATIONAL PROFILE AND HISTORY: Detailed assessment: Review of records and additional review of physical, cognitive, psychosocial history related to current functional performance.  CLINICAL DECISION MAKING: LOW - limited treatment options, no task modification necessary  REHAB POTENTIAL: Good  EVALUATION COMPLEXITY: Low      PLAN:  OT FREQUENCY: 2x/week  OT DURATION: 12 weeks( anticipate d/c after 8 weeks dependent on progress)  PLANNED INTERVENTIONS: 45409 OT Re-evaluation, 97535 self care/ADL training, 81191 therapeutic exercise, 97530 therapeutic activity, 97112 neuromuscular re-education, 97140 manual therapy, 97113 aquatic therapy, 97035 ultrasound, 97018 paraffin, 47829 fluidotherapy, 97010 moist heat, 97010 cryotherapy, 97034 contrast bath, 97760 Orthotics management and training, 56213 Splinting (initial encounter), [id number] Subsequent splinting/medication, passive range of motion, energy conservation, coping strategies training, patient/family education, and DME and/or AE instructions  RECOMMENDED OTHER SERVICES: none  CONSULTED AND AGREED WITH PLAN OF CARE: Patient  PLAN FOR NEXT SESSION:     functional use of UE's, ROM, AE   Sydney Azure, OT 10/03/2023,  9:50 AM

## 2023-10-05 ENCOUNTER — Ambulatory Visit: Admitting: Occupational Therapy

## 2023-10-05 ENCOUNTER — Ambulatory Visit: Admitting: Physical Therapy

## 2023-10-05 ENCOUNTER — Encounter: Payer: Self-pay | Admitting: Physical Therapy

## 2023-10-05 DIAGNOSIS — M25511 Pain in right shoulder: Secondary | ICD-10-CM | POA: Diagnosis not present

## 2023-10-05 DIAGNOSIS — M25641 Stiffness of right hand, not elsewhere classified: Secondary | ICD-10-CM | POA: Diagnosis not present

## 2023-10-05 DIAGNOSIS — M6281 Muscle weakness (generalized): Secondary | ICD-10-CM

## 2023-10-05 DIAGNOSIS — M79642 Pain in left hand: Secondary | ICD-10-CM | POA: Diagnosis not present

## 2023-10-05 DIAGNOSIS — R252 Cramp and spasm: Secondary | ICD-10-CM | POA: Diagnosis not present

## 2023-10-05 DIAGNOSIS — G8929 Other chronic pain: Secondary | ICD-10-CM | POA: Diagnosis not present

## 2023-10-05 DIAGNOSIS — R278 Other lack of coordination: Secondary | ICD-10-CM | POA: Diagnosis not present

## 2023-10-05 DIAGNOSIS — M25611 Stiffness of right shoulder, not elsewhere classified: Secondary | ICD-10-CM | POA: Diagnosis not present

## 2023-10-05 DIAGNOSIS — M25642 Stiffness of left hand, not elsewhere classified: Secondary | ICD-10-CM | POA: Diagnosis not present

## 2023-10-05 DIAGNOSIS — M542 Cervicalgia: Secondary | ICD-10-CM | POA: Diagnosis not present

## 2023-10-05 DIAGNOSIS — M79641 Pain in right hand: Secondary | ICD-10-CM | POA: Diagnosis not present

## 2023-10-05 NOTE — Therapy (Signed)
 OUTPATIENT PHYSICAL THERAPY SHOULDER    Patient Name: Veronica Galvan MRN: [id number] DOB:1956/02/15, 68 y.o., female Today's Date: 10/05/2023  END OF SESSION:  PT End of Session - 10/05/23 1446     Visit Number 71    Date for PT Re-Evaluation 11/02/23    Authorization Type [employer] [place]    PT Start Time 1445    PT Stop Time 1545    PT Time Calculation (min) 60 min    Activity Tolerance Patient tolerated treatment well    Behavior During Therapy WFL for tasks assessed/performed             Past Medical History:  Diagnosis Date   Allergy     Anxiety    Arthritis    hands   Cataract    Diabetes mellitus without complication (HCC)    type 2   Family history of adverse reaction to anesthesia    son has malignant hyperthermia, daughter does not daughter recently had c section without problems   GERD (gastroesophageal reflux disease)    Headache    sinus   Hyperlipidemia    Hypertension    PONV (postoperative nausea and vomiting)    nausea only   Ulcer, stomach peptic yrs ago   Past Surgical History:  Procedure Laterality Date   APPENDECTOMY     both hells bone spur repair     both heels with metal clips   both shoulder rotator cuff repair     CESAREAN SECTION     x 1   COLONOSCOPY WITH PROPOFOL  N/A 09/07/2016   Procedure: COLONOSCOPY WITH PROPOFOL ;  Surgeon: Garrett Kallman, MD;  Location: [facility];  Service: Endoscopy;  Laterality: N/A;   colonscopy  06/2011   polyps   ELBOW FRACTURE SURGERY Left    EYE SURGERY     FRACTURE SURGERY     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/10/2022   Procedure: REVERSE SHOULDER ARTHROPLASTY;  Surgeon: Ellard Gunning, MD;  Location: [facility];  Service: Orthopedics;  Laterality: Right;  Please follow in room 6 if able   TUBAL LIGATION     VESICO-VAGINAL FISTULA REPAIR     Patient Active Problem List   Diagnosis Date Noted   Gastroesophageal reflux disease 11/16/2021   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 08/18/2021   Dermatofibroma 08/18/2021   History of malignant neoplasm of skin 08/18/2021   Lentigo 08/18/2021   Melanocytic nevi of trunk 08/18/2021   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   Rosacea 08/18/2021   Actinic keratosis 08/18/2021   Sensorineural hearing loss (SNHL) of left ear with unrestricted hearing of right ear 07/26/2021   Tinnitus of left ear 07/26/2021   Acute recurrent maxillary sinusitis 06/22/2021   Primary hypertension 01/12/2021   Hyperlipidemia associated with type 2 diabetes mellitus (HCC) 01/12/2021   Arthritis 01/12/2021   Type II diabetes mellitus (HCC) 11/25/2019   Ingrown toenail 09/05/2018   Neck pain 01/29/2018   Tick bite 10/24/2017    PCP: Ike Malady, [name]  REFERRING PROVIDER: Alfredo Ano, MD  REFERRING DIAG: s/p right reverse TSA  THERAPY DIAG:  Muscle weakness (generalized)  Chronic right shoulder pain  Stiffness of right shoulder, not elsewhere classified  Cervicalgia  Spasm  Rationale for Evaluation and Treatment: Rehabilitation  ONSET DATE: 11/05/22  SUBJECTIVE:  SUBJECTIVE STATEMENT:   Still awaiting US  of the right shoulder on June 17th to see if there is loosening of the prosthesis. She does report some increased pain after the last treatment US  scheduled for the right shoulder June 17th.  Reports that the pain in the left is much improved with better ROM, still very painful in the right  PAIN:  Are you having pain? Yes: NPRS scale: 6/10 Pain location: right shoulder, HA Pain description: dull ache at rest, sharp with motions Aggravating factors: quick motions, movements pain up to 10/10 Relieving factors: ice, rest, Tylenol  at best 3/10  PRECAUTIONS: Shoulder Protocol in the chart  RED FLAGS: None   WEIGHT BEARING  RESTRICTIONS: No  FALLS:  Has patient fallen in last 6 months? Yes. Number of falls 1  LIVING ENVIRONMENT: Lives with: lives alone Lives in: House/apartment Stairs: No Has following equipment at home: None  OCCUPATION: retired  PLOF: Independent  PATIENT GOALS:dress without difficulty, do hair, have good ROM and less pain  NEXT MD VISIT:   OBJECTIVE:   DIAGNOSTIC FINDINGS:  See above  PATIENT SURVEYS:  FOTO 21  COGNITION: Overall cognitive status: Within functional limits for tasks assessed     SENSATION: WFL  POSTURE: Fwd head, rounded shoulders, elevated and guarded shoulder  UPPER EXTREMITY ROM:   Active ROM Right PROM eval Left AROM  08/31/23 PROM Supine 01/05/23 PROM  01/26/23 PROM 02/09/23 AROM sitting 02/14/23 AROM 02/21/23 AROM  03/02/23 AROM  03/22/23 AAROM 03/28/23 AROM Standing 04/13/23 AROM Standing 05/02/23 AROM  Standing 05/11/23 AROM  Standing 05/25/23 AROM Standing 06/01/23 AROM Standing  07/06/23 AROM  Standing 07/27/23 AROM Standing 08/31/23 AROM Standing  10/03/23  Shoulder flexion 35 90 118  123 90 96 95 100 111 110 112 120 125  129 132 130 105 115  Shoulder extension 5                    Shoulder abduction 20 95 90 92  73 80 83 90 93 90 92 98 100 102  105 95 81 90  Shoulder adduction                     Shoulder internal rotation 15 35            40 40  42 25   Shoulder external rotation 20 65 45 30  41  45 50 53 56 56 60 66 67 70 70  47 60  Elbow flexion 120                    Elbow extension 5                    Wrist flexion                     Wrist extension                     Wrist ulnar deviation                     Wrist radial deviation                     Wrist pronation                     Wrist supination                     (  Blank rows = not tested)  UPPER EXTREMITY MMT:  No tested due to recent surgery  MMT Right eval Left eval  Shoulder flexion    Shoulder extension    Shoulder abduction    Shoulder adduction     Shoulder internal rotation    Shoulder external rotation    Middle trapezius    Lower trapezius    Elbow flexion    Elbow extension    Wrist flexion    Wrist extension    Wrist ulnar deviation    Wrist radial deviation    Wrist pronation    Wrist supination    Grip strength (lbs)    (Blank rows = not tested)  PALPATION:  Very tight and tender in the pectoral, upper trap, the entire right upper arm   TODAY'S TREATMENT:                                                                                                                                         DATE:  10/05/23 UBE level 4 x 5 minutes Supine chest presses Supine flexion with 2# wate bar Passive stretch gentle right shoulder STM to the right upper trap and neck Vaso medium pressure  10/03/23 UBE level 3 x 6 minutes Supine wand chest press Supine wand flexion Passive stretch right shoulder  STM to the right upper arm and pectoral area Vaso 34 degrees right shoulder ROM measured as above  09/29/23 STM to the right upper trap, MFR to the upper trap knots Passive stretch to the right shoulder Vaso medium pressure right shoulder 34 degrees  09/27/23 STM to the right upper trap, the rhomboid and the cervical area Gentle PROM of the right shoulder to her tolerance Vaso medium pressure 34 degrees  09/19/23 Passive stretch of the right and left shoulder STM to the upper traps, neck, rhomboids and upper arms Vaso to the right shoulder  09/13/23 AROM of the left shoulder today was much improved 110 degrees  Walking with encouragement of normal arm swing and no elevation of the shoulders PROM bilateral shoulders STM to the right upper arm, right upper trap and tight neck Vaso medium pressure  09/07/23 Reviewed US  of the left shoulder and x-rays as patient had questions PROM bilateral shoulders STM to the upper traps, rhomboids, cervical area Vaso to bilateral shoulders medium pressure   09/05/23 Nustep level 3 x 5  minutes UBE level 2 x 3 minutes US  to the left shoulder/upper arm STM to the neck and upper traps Passive stretch shoulders She then went to OT and after I put her on the VASO  08/31/23 Discussion as noted below, measured both shoulder AROM in standing as noted above STM to the right upper arm and shoulder Passive stretch of the right shoulder to her point of pain Vaso bilateral shoulders for pain medium pressure   08/03/23 Pateint had a lot of questions regarding  the bloodwork and the bone scan and what they are looking for, I strongly advised her to follow the MD/PA orders to wear sling and not use the arm, I did my best to answer her questions.  Finished with Vaso medium pressure 34 degrees  07/27/23 Gentle shoulder distractions Gentle PROM to her tolerance, a lot of cues to relax Neural tension stretches of the right UE STM to the upper trap, neck and rhomboid STM to the biceps, deltoid and pectoral area  07/25/23 UBE level 3 x 4 minutes for mm perfusion Lats 15# too painful Seated row 10# Doorway stretch PROM right shoulder all motions to tolerance STM to the tight upper arm and the right pectoral area PATIENT EDUCATION: Education details: poc/hep Person educated: Patient Education method: Programmer, multimedia, Facilities manager, Actor cues, Verbal cues, and Handouts Education comprehension: verbalized understanding  HOME EXERCISE PROGRAM: Access Code: [email or web address] URL: [email or web address] Date: 12/20/2022 Prepared by: Cherylene Corrente  Exercises - Seated Shoulder Flexion Towel Slide at Table Top  - 2 x daily - 7 x weekly - 2 sets - 10 reps - 3 hold - Seated Shoulder External Rotation PROM on Table  - 2 x daily - 7 x weekly - 2 sets - 10 reps - 3 hold - Seated Elbow Extension and Shoulder External Rotation AAROM at Table with Towel  - 2 x daily - 7 x weekly - 2 sets - 10 reps - 3 hold - Circular Shoulder Pendulum with Table Support  - 2 x daily - 7 x weekly - 2 sets -  10 reps - 3 hold  Access Code: [email or web address] URL: [email or web address] Date: 07/13/2023 Prepared by: Cherylene Corrente  Exercises - Isometric Shoulder Flexion at Wall  - 1 x daily - 7 x weekly - 1 sets - 10 reps - 3 hold - Standing Isometric Shoulder Internal Rotation at Doorway  - 1 x daily - 7 x weekly - 1 sets - 10 reps - 3 hold - Isometric Shoulder Extension at Wall  - 1 x daily - 7 x weekly - 1 sets - 10 reps - 3 hold - Isometric Shoulder Abduction at Wall  - 1 x daily - 7 x weekly - 1 sets - 10 reps - 3 hold - Standing Isometric Shoulder External Rotation with Doorway  - 1 x daily - 7 x weekly - 1 sets - 10 reps - 3 hold  ASSESSMENT:  CLINICAL IMPRESSION:  Patient is awaiting the US  to see if the prosthesis is in place and snug and without issue, I am still being conservative and working on spasms and ROM.  I worked on the tightness of the pectoral area and the right upper arm, she is improving with her AROM but still not back to the place we were in January and up to March.   Patient saw surgeon, the bone scan said did not feel that there was infection or loosening however the surgeon is not so sure, he wants to do an US  to be sure, that is scheduled for the next few weeks, since she has not been to PT in a month I re-measured her ROM, she has lost at least 15 degrees of motion and more for all motions.  She is also having more pain in the left shoulder possibly from overdoing it, because she is trying to not use the right shoulder.  She has talked about this with her primary and I suggested that she see a sports medicine MD, she called while she was here  and will have appointment with him next week.  As far as the right shoulder the Surgeon stated in his notes to resume formal PT on a conservative basis to help with ROM and pain until the US  is performed   OBJECTIVE IMPAIRMENTS: cardiopulmonary status limiting activity, decreased activity tolerance, decreased endurance,  decreased ROM, decreased strength, increased edema, increased muscle spasms, impaired flexibility, impaired UE functional use, improper body mechanics, postural dysfunction, and pain.  REHAB POTENTIAL: Good  CLINICAL DECISION MAKING: Evolving/moderate complexity  EVALUATION COMPLEXITY: Low   GOALS: Goals reviewed with patient? Yes  SHORT TERM GOALS: Target date: 01/01/23  Independent with initial HEP Goal status: 12/27/22 MET  LONG TERM GOALS: Target date: 03/22/23  Decrease pain 50% Goal status: progressing 10/03/23  2.  Dress without difficulty Goal status: progressing 10/03/23  3.  Do hair without difficulty Goal status: able to reach the top of head at times progressing 2/20/253/25/25 4.  Increase AROM right shoulder flexion to 130 degrees Goal status: 07/25/23 progressing 130 degrees with pain, since March has regressed but is better since march6/3/25  5.  Increase right shoulder ER to 60 degrees Goal status: met 07/06/23  6.  Return to water  aerobics and or gym activity Goal status:progressing met 06/01/23  PLAN:  PT FREQUENCY: 1-2x/week  PT DURATION: 12 weeks  PLANNED INTERVENTIONS: Therapeutic exercises, Therapeutic activity, Neuromuscular re-education, Balance training, Gait training, Patient/Family education, Self Care, Joint mobilization, Dry Needling, Electrical stimulation, Cryotherapy, Vasopneumatic device, and Manual therapy  PLAN FOR NEXT SESSION:   will resume PT on the right shoulder conservatively to try to get back lost ROM and help with pain until she has US , will evaluate left shoulder soon 10/05/2023, 2:47 PM [facility] [address] [facility] [place], [place], [address] Phone: [phone]   Fax:  [phone]

## 2023-10-10 ENCOUNTER — Ambulatory Visit: Admitting: Physical Therapy

## 2023-10-10 ENCOUNTER — Encounter: Payer: Self-pay | Admitting: Physical Therapy

## 2023-10-10 DIAGNOSIS — M25511 Pain in right shoulder: Secondary | ICD-10-CM | POA: Diagnosis not present

## 2023-10-10 DIAGNOSIS — G8929 Other chronic pain: Secondary | ICD-10-CM

## 2023-10-10 DIAGNOSIS — R252 Cramp and spasm: Secondary | ICD-10-CM

## 2023-10-10 DIAGNOSIS — M79641 Pain in right hand: Secondary | ICD-10-CM | POA: Diagnosis not present

## 2023-10-10 DIAGNOSIS — M25641 Stiffness of right hand, not elsewhere classified: Secondary | ICD-10-CM | POA: Diagnosis not present

## 2023-10-10 DIAGNOSIS — M25642 Stiffness of left hand, not elsewhere classified: Secondary | ICD-10-CM | POA: Diagnosis not present

## 2023-10-10 DIAGNOSIS — M79642 Pain in left hand: Secondary | ICD-10-CM | POA: Diagnosis not present

## 2023-10-10 DIAGNOSIS — M25611 Stiffness of right shoulder, not elsewhere classified: Secondary | ICD-10-CM

## 2023-10-10 DIAGNOSIS — M542 Cervicalgia: Secondary | ICD-10-CM | POA: Diagnosis not present

## 2023-10-10 DIAGNOSIS — M6281 Muscle weakness (generalized): Secondary | ICD-10-CM | POA: Diagnosis not present

## 2023-10-10 DIAGNOSIS — R278 Other lack of coordination: Secondary | ICD-10-CM | POA: Diagnosis not present

## 2023-10-10 NOTE — Therapy (Signed)
 OUTPATIENT PHYSICAL THERAPY SHOULDER    Patient Name: Veronica Galvan MRN: [id number] DOB:May 19, 1955, 68 y.o., female Today's Date: 10/10/2023  END OF SESSION:  PT End of Session - 10/10/23 0758     Visit Number 72    Date for PT Re-Evaluation 11/02/23    Authorization Type [employer] [place]    PT Start Time [phone]    PT Stop Time 0857    PT Time Calculation (min) 60 min    Activity Tolerance Patient tolerated treatment well    Behavior During Therapy WFL for tasks assessed/performed             Past Medical History:  Diagnosis Date   Allergy     Anxiety    Arthritis    hands   Cataract    Diabetes mellitus without complication (HCC)    type 2   Family history of adverse reaction to anesthesia    son has malignant hyperthermia, daughter does not daughter recently had c section without problems   GERD (gastroesophageal reflux disease)    Headache    sinus   Hyperlipidemia    Hypertension    PONV (postoperative nausea and vomiting)    nausea only   Ulcer, stomach peptic yrs ago   Past Surgical History:  Procedure Laterality Date   APPENDECTOMY     both hells bone spur repair     both heels with metal clips   both shoulder rotator cuff repair     CESAREAN SECTION     x 1   COLONOSCOPY WITH PROPOFOL  N/A 09/07/2016   Procedure: COLONOSCOPY WITH PROPOFOL ;  Surgeon: Garrett Kallman, MD;  Location: [facility];  Service: Endoscopy;  Laterality: N/A;   colonscopy  06/2011   polyps   ELBOW FRACTURE SURGERY Left    EYE SURGERY     FRACTURE SURGERY     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/10/2022   Procedure: REVERSE SHOULDER ARTHROPLASTY;  Surgeon: Ellard Gunning, MD;  Location: [facility];  Service: Orthopedics;  Laterality: Right;  Please follow in room 6 if able   TUBAL LIGATION     VESICO-VAGINAL FISTULA REPAIR     Patient Active Problem List   Diagnosis Date Noted   Gastroesophageal reflux disease 11/16/2021   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 08/18/2021   Dermatofibroma 08/18/2021   History of malignant neoplasm of skin 08/18/2021   Lentigo 08/18/2021   Melanocytic nevi of trunk 08/18/2021   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   Rosacea 08/18/2021   Actinic keratosis 08/18/2021   Sensorineural hearing loss (SNHL) of left ear with unrestricted hearing of right ear 07/26/2021   Tinnitus of left ear 07/26/2021   Acute recurrent maxillary sinusitis 06/22/2021   Primary hypertension 01/12/2021   Hyperlipidemia associated with type 2 diabetes mellitus (HCC) 01/12/2021   Arthritis 01/12/2021   Type II diabetes mellitus (HCC) 11/25/2019   Ingrown toenail 09/05/2018   Neck pain 01/29/2018   Tick bite 10/24/2017    PCP: Ike Malady, [name]  REFERRING PROVIDER: Alfredo Ano, MD  REFERRING DIAG: s/p right reverse TSA  THERAPY DIAG:  Muscle weakness (generalized)  Chronic right shoulder pain  Stiffness of right shoulder, not elsewhere classified  Cervicalgia  Spasm  Rationale for Evaluation and Treatment: Rehabilitation  ONSET DATE: 11/05/22  SUBJECTIVE:  SUBJECTIVE STATEMENT:   Still awaiting US  of the right shoulder on June 17th to see if there is loosening of the prosthesis. She reports that the last treatment did help with pain and her function PAIN:  Are you having pain? Yes: NPRS scale: 5/10 Pain location: right shoulder, HA Pain description: dull ache at rest, sharp with motions Aggravating factors: quick motions, movements pain up to 10/10 Relieving factors: ice, rest, Tylenol  at best 3/10  PRECAUTIONS: Shoulder Protocol in the chart  RED FLAGS: None   WEIGHT BEARING RESTRICTIONS: No  FALLS:  Has patient fallen in last 6 months? Yes. Number of falls 1  LIVING ENVIRONMENT: Lives with: lives alone Lives in:  House/apartment Stairs: No Has following equipment at home: None  OCCUPATION: retired  PLOF: Independent  PATIENT GOALS:dress without difficulty, do hair, have good ROM and less pain  NEXT MD VISIT:   OBJECTIVE:   DIAGNOSTIC FINDINGS:  See above  PATIENT SURVEYS:  FOTO 21  COGNITION: Overall cognitive status: Within functional limits for tasks assessed     SENSATION: WFL  POSTURE: Fwd head, rounded shoulders, elevated and guarded shoulder  UPPER EXTREMITY ROM:   Active ROM Right PROM eval Left AROM  08/31/23 PROM Supine 01/05/23 PROM  01/26/23 PROM 02/09/23 AROM sitting 02/14/23 AROM 02/21/23 AROM  03/02/23 AROM  03/22/23 AAROM 03/28/23 AROM Standing 04/13/23 AROM Standing 05/02/23 AROM  Standing 05/11/23 AROM  Standing 05/25/23 AROM Standing 06/01/23 AROM Standing  07/06/23 AROM  Standing 07/27/23 AROM Standing 08/31/23 AROM Standing  10/03/23  Shoulder flexion 35 90 118  123 90 96 95 100 111 110 112 120 125  129 132 130 105 115  Shoulder extension 5                    Shoulder abduction 20 95 90 92  73 80 83 90 93 90 92 98 100 102  105 95 81 90  Shoulder adduction                     Shoulder internal rotation 15 35            40 40  42 25   Shoulder external rotation 20 65 45 30  41  45 50 53 56 56 60 66 67 70 70  47 60  Elbow flexion 120                    Elbow extension 5                    Wrist flexion                     Wrist extension                     Wrist ulnar deviation                     Wrist radial deviation                     Wrist pronation                     Wrist supination                     (Blank rows = not tested)  UPPER EXTREMITY MMT:  No tested due to recent surgery  MMT Right eval Left eval  Shoulder flexion  Shoulder extension    Shoulder abduction    Shoulder adduction    Shoulder internal rotation    Shoulder external rotation    Middle trapezius    Lower trapezius    Elbow flexion    Elbow extension     Wrist flexion    Wrist extension    Wrist ulnar deviation    Wrist radial deviation    Wrist pronation    Wrist supination    Grip strength (lbs)    (Blank rows = not tested)  PALPATION:  Very tight and tender in the pectoral, upper trap, the entire right upper arm   TODAY'S TREATMENT:                                                                                                                                         DATE:  UBE level 3 x 6 minutes PROM right shoulder to her tolerance all motions STM to the right upper trap, neck and rhomboid Vaso right shoulder medium pressure 34 degrees  10/05/23 UBE level 4 x 5 minutes Supine chest presses Supine flexion with 2# wate bar Passive stretch gentle right shoulder STM to the right upper trap and neck Vaso medium pressure  10/03/23 UBE level 3 x 6 minutes Supine wand chest press Supine wand flexion Passive stretch right shoulder  STM to the right upper arm and pectoral area Vaso 34 degrees right shoulder ROM measured as above  09/29/23 STM to the right upper trap, MFR to the upper trap knots Passive stretch to the right shoulder Vaso medium pressure right shoulder 34 degrees  09/27/23 STM to the right upper trap, the rhomboid and the cervical area Gentle PROM of the right shoulder to her tolerance Vaso medium pressure 34 degrees  09/19/23 Passive stretch of the right and left shoulder STM to the upper traps, neck, rhomboids and upper arms Vaso to the right shoulder  09/13/23 AROM of the left shoulder today was much improved 110 degrees  Walking with encouragement of normal arm swing and no elevation of the shoulders PROM bilateral shoulders STM to the right upper arm, right upper trap and tight neck Vaso medium pressure  09/07/23 Reviewed US  of the left shoulder and x-rays as patient had questions PROM bilateral shoulders STM to the upper traps, rhomboids, cervical area Vaso to bilateral shoulders medium  pressure   09/05/23 Nustep level 3 x 5 minutes UBE level 2 x 3 minutes US  to the left shoulder/upper arm STM to the neck and upper traps Passive stretch shoulders She then went to OT and after I put her on the VASO  08/31/23 Discussion as noted below, measured both shoulder AROM in standing as noted above STM to the right upper arm and shoulder Passive stretch of the right shoulder to her point of pain Vaso bilateral shoulders for pain medium pressure   08/03/23 Pateint had a lot  of questions regarding the bloodwork and the bone scan and what they are looking for, I strongly advised her to follow the MD/PA orders to wear sling and not use the arm, I did my best to answer her questions.  Finished with Vaso medium pressure 34 degrees  07/27/23 Gentle shoulder distractions Gentle PROM to her tolerance, a lot of cues to relax Neural tension stretches of the right UE STM to the upper trap, neck and rhomboid STM to the biceps, deltoid and pectoral area  07/25/23 UBE level 3 x 4 minutes for mm perfusion Lats 15# too painful Seated row 10# Doorway stretch PROM right shoulder all motions to tolerance STM to the tight upper arm and the right pectoral area PATIENT EDUCATION: Education details: poc/hep Person educated: Patient Education method: Programmer, multimedia, Facilities manager, Actor cues, Verbal cues, and Handouts Education comprehension: verbalized understanding  HOME EXERCISE PROGRAM: Access Code: [email or web address] URL: [email or web address] Date: 12/20/2022 Prepared by: Cherylene Corrente  Exercises - Seated Shoulder Flexion Towel Slide at Table Top  - 2 x daily - 7 x weekly - 2 sets - 10 reps - 3 hold - Seated Shoulder External Rotation PROM on Table  - 2 x daily - 7 x weekly - 2 sets - 10 reps - 3 hold - Seated Elbow Extension and Shoulder External Rotation AAROM at Table with Towel  - 2 x daily - 7 x weekly - 2 sets - 10 reps - 3 hold - Circular Shoulder Pendulum with Table  Support  - 2 x daily - 7 x weekly - 2 sets - 10 reps - 3 hold  Access Code: [email or web address] URL: [email or web address] Date: 07/13/2023 Prepared by: Cherylene Corrente  Exercises - Isometric Shoulder Flexion at Wall  - 1 x daily - 7 x weekly - 1 sets - 10 reps - 3 hold - Standing Isometric Shoulder Internal Rotation at Doorway  - 1 x daily - 7 x weekly - 1 sets - 10 reps - 3 hold - Isometric Shoulder Extension at Wall  - 1 x daily - 7 x weekly - 1 sets - 10 reps - 3 hold - Isometric Shoulder Abduction at Wall  - 1 x daily - 7 x weekly - 1 sets - 10 reps - 3 hold - Standing Isometric Shoulder External Rotation with Doorway  - 1 x daily - 7 x weekly - 1 sets - 10 reps - 3 hold  ASSESSMENT:  CLINICAL IMPRESSION:  Patient is awaiting the US  to see if the prosthesis is in place and snug and without issue, I am still being conservative and working on spasms and ROM.  She is still very sore at times, especially with compensation of the upper trap  Patient saw surgeon, the bone scan said did not feel that there was infection or loosening however the surgeon is not so sure, he wants to do an US  to be sure, that is scheduled for the next few weeks, since she has not been to PT in a month I re-measured her ROM, she has lost at least 15 degrees of motion and more for all motions.  She is also having more pain in the left shoulder possibly from overdoing it, because she is trying to not use the right shoulder.  She has talked about this with her primary and I suggested that she see a sports medicine MD, she called while she was here and will have appointment with him next week.  As far as the right shoulder the Surgeon stated in  his notes to resume formal PT on a conservative basis to help with ROM and pain until the US  is performed   OBJECTIVE IMPAIRMENTS: cardiopulmonary status limiting activity, decreased activity tolerance, decreased endurance, decreased ROM, decreased strength, increased edema,  increased muscle spasms, impaired flexibility, impaired UE functional use, improper body mechanics, postural dysfunction, and pain.  REHAB POTENTIAL: Good  CLINICAL DECISION MAKING: Evolving/moderate complexity  EVALUATION COMPLEXITY: Low   GOALS: Goals reviewed with patient? Yes  SHORT TERM GOALS: Target date: 01/01/23  Independent with initial HEP Goal status: 12/27/22 MET  LONG TERM GOALS: Target date: 03/22/23  Decrease pain 50% Goal status: progressing 10/03/23  2.  Dress without difficulty Goal status: progressing 10/03/23  3.  Do hair without difficulty Goal status: able to reach the top of head at times progressing 2/20/253/25/25 4.  Increase AROM right shoulder flexion to 130 degrees Goal status: 07/25/23 progressing 130 degrees with pain, since March has regressed but is better since march6/3/25  5.  Increase right shoulder ER to 60 degrees Goal status: met 07/06/23  6.  Return to water  aerobics and or gym activity Goal status:progressing met 06/01/23  PLAN:  PT FREQUENCY: 1-2x/week  PT DURATION: 12 weeks  PLANNED INTERVENTIONS: Therapeutic exercises, Therapeutic activity, Neuromuscular re-education, Balance training, Gait training, Patient/Family education, Self Care, Joint mobilization, Dry Needling, Electrical stimulation, Cryotherapy, Vasopneumatic device, and Manual therapy  PLAN FOR NEXT SESSION:   will resume PT on the right shoulder conservatively to try to get back lost ROM and help with pain until she has US , will evaluate left shoulder soon 10/10/2023, 8:44 AM [facility] [address] [facility]. [place], [place], [address] Phone: [phone]   Fax:  [phone]

## 2023-10-12 ENCOUNTER — Encounter: Payer: Self-pay | Admitting: Physical Therapy

## 2023-10-12 ENCOUNTER — Ambulatory Visit: Admitting: Physical Therapy

## 2023-10-12 DIAGNOSIS — R252 Cramp and spasm: Secondary | ICD-10-CM

## 2023-10-12 DIAGNOSIS — G8929 Other chronic pain: Secondary | ICD-10-CM | POA: Diagnosis not present

## 2023-10-12 DIAGNOSIS — M25611 Stiffness of right shoulder, not elsewhere classified: Secondary | ICD-10-CM | POA: Diagnosis not present

## 2023-10-12 DIAGNOSIS — M25511 Pain in right shoulder: Secondary | ICD-10-CM | POA: Diagnosis not present

## 2023-10-12 DIAGNOSIS — M542 Cervicalgia: Secondary | ICD-10-CM | POA: Diagnosis not present

## 2023-10-12 DIAGNOSIS — M79641 Pain in right hand: Secondary | ICD-10-CM | POA: Diagnosis not present

## 2023-10-12 DIAGNOSIS — M6281 Muscle weakness (generalized): Secondary | ICD-10-CM

## 2023-10-12 DIAGNOSIS — M25641 Stiffness of right hand, not elsewhere classified: Secondary | ICD-10-CM | POA: Diagnosis not present

## 2023-10-12 DIAGNOSIS — R278 Other lack of coordination: Secondary | ICD-10-CM | POA: Diagnosis not present

## 2023-10-12 DIAGNOSIS — M25642 Stiffness of left hand, not elsewhere classified: Secondary | ICD-10-CM | POA: Diagnosis not present

## 2023-10-12 DIAGNOSIS — M79642 Pain in left hand: Secondary | ICD-10-CM | POA: Diagnosis not present

## 2023-10-12 NOTE — Therapy (Signed)
 OUTPATIENT PHYSICAL THERAPY SHOULDER    Patient Name: Veronica Galvan MRN: [id number] DOB:1955/09/28, 68 y.o., female Today's Date: 10/12/2023  END OF SESSION:  PT End of Session - 10/12/23 0842     Visit Number 73    Date for PT Re-Evaluation 11/02/23    Authorization Type [employer] [place]    PT Start Time [phone]    PT Stop Time 0943    PT Time Calculation (min) 60 min    Activity Tolerance Patient tolerated treatment well    Behavior During Therapy [facility] for tasks assessed/performed          Past Medical History:  Diagnosis Date   Allergy     Anxiety    Arthritis    hands   Cataract    Diabetes mellitus without complication (HCC)    type 2   Family history of adverse reaction to anesthesia    son has malignant hyperthermia, daughter does not daughter recently had c section without problems   GERD (gastroesophageal reflux disease)    Headache    sinus   Hyperlipidemia    Hypertension    PONV (postoperative nausea and vomiting)    nausea only   Ulcer, stomach peptic yrs ago   Past Surgical History:  Procedure Laterality Date   APPENDECTOMY     both hells bone spur repair     both heels with metal clips   both shoulder rotator cuff repair     CESAREAN SECTION     x 1   COLONOSCOPY WITH PROPOFOL  N/A 09/07/2016   Procedure: COLONOSCOPY WITH PROPOFOL ;  Surgeon: Garrett Kallman, MD;  Location: [facility];  Service: Endoscopy;  Laterality: N/A;   colonscopy  06/2011   polyps   ELBOW FRACTURE SURGERY Left    EYE SURGERY     FRACTURE SURGERY     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/10/2022   Procedure: REVERSE SHOULDER ARTHROPLASTY;  Surgeon: Ellard Gunning, MD;  Location: [facility];  Service: Orthopedics;  Laterality: Right;  Please follow in room 6 if able   TUBAL LIGATION     VESICO-VAGINAL FISTULA REPAIR     Patient Active Problem List   Diagnosis Date Noted   Gastroesophageal reflux disease 11/16/2021   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 08/18/2021   Dermatofibroma 08/18/2021   History of malignant neoplasm of skin 08/18/2021   Lentigo 08/18/2021   Melanocytic nevi of trunk 08/18/2021   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   Rosacea 08/18/2021   Actinic keratosis 08/18/2021   Sensorineural hearing loss (SNHL) of left ear with unrestricted hearing of right ear 07/26/2021   Tinnitus of left ear 07/26/2021   Acute recurrent maxillary sinusitis 06/22/2021   Primary hypertension 01/12/2021   Hyperlipidemia associated with type 2 diabetes mellitus (HCC) 01/12/2021   Arthritis 01/12/2021   Type II diabetes mellitus (HCC) 11/25/2019   Ingrown toenail 09/05/2018   Neck pain 01/29/2018   Tick bite 10/24/2017    PCP: Ike Malady, [name]  REFERRING PROVIDER: Alfredo Ano, MD  REFERRING DIAG: s/p right reverse TSA  THERAPY DIAG:  Muscle weakness (generalized)  Chronic right shoulder pain  Stiffness of right shoulder, not elsewhere classified  Cervicalgia  Spasm  Acute pain of right shoulder  Rationale for Evaluation and Treatment: Rehabilitation  ONSET DATE: 11/05/22  SUBJECTIVE:  SUBJECTIVE STATEMENT:   Still awaiting US  of the right shoulder on June 17th to see if there is loosening of the prosthesis. She reports that the last treatment did help with pain and her function, she is having more pain in the right shoulder today as she did a lot of shopping and prep work for [employer] PAIN:  Are you having pain? Yes: NPRS scale: 5/10 Pain location: right shoulder, HA Pain description: dull ache at rest, sharp with motions Aggravating factors: quick motions, movements pain up to 10/10 Relieving factors: ice, rest, Tylenol  at best 3/10  PRECAUTIONS: Shoulder Protocol in the chart  RED FLAGS: None   WEIGHT BEARING RESTRICTIONS:  No  FALLS:  Has patient fallen in last 6 months? Yes. Number of falls 1  LIVING ENVIRONMENT: Lives with: lives alone Lives in: House/apartment Stairs: No Has following equipment at home: None  OCCUPATION: retired  PLOF: Independent  PATIENT GOALS:dress without difficulty, do hair, have good ROM and less pain  NEXT MD VISIT:   OBJECTIVE:   DIAGNOSTIC FINDINGS:  See above  PATIENT SURVEYS:  FOTO 21  COGNITION: Overall cognitive status: Within functional limits for tasks assessed     SENSATION: WFL  POSTURE: Fwd head, rounded shoulders, elevated and guarded shoulder  UPPER EXTREMITY ROM:   Active ROM Right PROM eval Left AROM  08/31/23 PROM Supine 01/05/23 PROM  01/26/23 PROM 02/09/23 AROM sitting 02/14/23 AROM 02/21/23 AROM  03/02/23 AROM  03/22/23 AAROM 03/28/23 AROM Standing 04/13/23 AROM Standing 05/02/23 AROM  Standing 05/11/23 AROM  Standing 05/25/23 AROM Standing 06/01/23 AROM Standing  07/06/23 AROM  Standing 07/27/23 AROM Standing 08/31/23 AROM Standing  10/03/23  Shoulder flexion 35 90 118  123 90 96 95 100 111 110 112 120 125  129 132 130 105 115  Shoulder extension 5                    Shoulder abduction 20 95 90 92  73 80 83 90 93 90 92 98 100 102  105 95 81 90  Shoulder adduction                     Shoulder internal rotation 15 35            40 40  42 25   Shoulder external rotation 20 65 45 30  41  45 50 53 56 56 60 66 67 70 70  47 60  Elbow flexion 120                    Elbow extension 5                    Wrist flexion                     Wrist extension                     Wrist ulnar deviation                     Wrist radial deviation                     Wrist pronation                     Wrist supination                     (Blank rows =  not tested)  UPPER EXTREMITY MMT:  No tested due to recent surgery  MMT Right eval Left eval  Shoulder flexion    Shoulder extension    Shoulder abduction    Shoulder adduction    Shoulder  internal rotation    Shoulder external rotation    Middle trapezius    Lower trapezius    Elbow flexion    Elbow extension    Wrist flexion    Wrist extension    Wrist ulnar deviation    Wrist radial deviation    Wrist pronation    Wrist supination    Grip strength (lbs)    (Blank rows = not tested)  PALPATION:  Very tight and tender in the pectoral, upper trap, the entire right upper arm   TODAY'S TREATMENT:                                                                                                                                         DATE:  10/12/23 UBE level 4 x 6 minutes Passive stretch to bilateral shoulders STM to the left shoulder/upper arm STM to the right upper trap, rhomboid, neck and teres. Vaso medium pressure 34 degrees  10/10/23 UBE level 3 x 6 minutes PROM right shoulder to her tolerance all motions STM to the right upper trap, neck and rhomboid Vaso right shoulder medium pressure 34 degrees  10/05/23 UBE level 4 x 5 minutes Supine chest presses Supine flexion with 2# wate bar Passive stretch gentle right shoulder STM to the right upper trap and neck Vaso medium pressure  10/03/23 UBE level 3 x 6 minutes Supine wand chest press Supine wand flexion Passive stretch right shoulder  STM to the right upper arm and pectoral area Vaso 34 degrees right shoulder ROM measured as above  09/29/23 STM to the right upper trap, MFR to the upper trap knots Passive stretch to the right shoulder Vaso medium pressure right shoulder 34 degrees  09/27/23 STM to the right upper trap, the rhomboid and the cervical area Gentle PROM of the right shoulder to her tolerance Vaso medium pressure 34 degrees  09/19/23 Passive stretch of the right and left shoulder STM to the upper traps, neck, rhomboids and upper arms Vaso to the right shoulder  09/13/23 AROM of the left shoulder today was much improved 110 degrees  Walking with encouragement of normal arm swing and no  elevation of the shoulders PROM bilateral shoulders STM to the right upper arm, right upper trap and tight neck Vaso medium pressure  09/07/23 Reviewed US  of the left shoulder and x-rays as patient had questions PROM bilateral shoulders STM to the upper traps, rhomboids, cervical area Vaso to bilateral shoulders medium pressure   09/05/23 Nustep level 3 x 5 minutes UBE level 2 x 3 minutes US  to the left shoulder/upper arm STM to the neck and upper traps Passive stretch shoulders She  then went to OT and after I put her on the VASO  08/31/23 Discussion as noted below, measured both shoulder AROM in standing as noted above STM to the right upper arm and shoulder Passive stretch of the right shoulder to her point of pain Vaso bilateral shoulders for pain medium pressure   08/03/23 Pateint had a lot of questions regarding the bloodwork and the bone scan and what they are looking for, I strongly advised her to follow the MD/PA orders to wear sling and not use the arm, I did my best to answer her questions.  Finished with Vaso medium pressure 34 degrees  07/27/23 Gentle shoulder distractions Gentle PROM to her tolerance, a lot of cues to relax Neural tension stretches of the right UE STM to the upper trap, neck and rhomboid STM to the biceps, deltoid and pectoral area  07/25/23 UBE level 3 x 4 minutes for mm perfusion Lats 15# too painful Seated row 10# Doorway stretch PROM right shoulder all motions to tolerance STM to the tight upper arm and the right pectoral area PATIENT EDUCATION: Education details: poc/hep Person educated: Patient Education method: Programmer, multimedia, Facilities manager, Actor cues, Verbal cues, and Handouts Education comprehension: verbalized understanding  HOME EXERCISE PROGRAM: Access Code: [email or web address] URL: [email or web address] Date: 12/20/2022 Prepared by: Cherylene Corrente  Exercises - Seated Shoulder Flexion Towel Slide at Table Top  - 2 x daily -  7 x weekly - 2 sets - 10 reps - 3 hold - Seated Shoulder External Rotation PROM on Table  - 2 x daily - 7 x weekly - 2 sets - 10 reps - 3 hold - Seated Elbow Extension and Shoulder External Rotation AAROM at Table with Towel  - 2 x daily - 7 x weekly - 2 sets - 10 reps - 3 hold - Circular Shoulder Pendulum with Table Support  - 2 x daily - 7 x weekly - 2 sets - 10 reps - 3 hold  Access Code: [email or web address] URL: [email or web address] Date: 07/13/2023 Prepared by: Cherylene Corrente  Exercises - Isometric Shoulder Flexion at Wall  - 1 x daily - 7 x weekly - 1 sets - 10 reps - 3 hold - Standing Isometric Shoulder Internal Rotation at Doorway  - 1 x daily - 7 x weekly - 1 sets - 10 reps - 3 hold - Isometric Shoulder Extension at Wall  - 1 x daily - 7 x weekly - 1 sets - 10 reps - 3 hold - Isometric Shoulder Abduction at Wall  - 1 x daily - 7 x weekly - 1 sets - 10 reps - 3 hold - Standing Isometric Shoulder External Rotation with Doorway  - 1 x daily - 7 x weekly - 1 sets - 10 reps - 3 hold  ASSESSMENT:  CLINICAL IMPRESSION:  Patient is awaiting the US  to see if the prosthesis is in place and snug and without issue, I am still being conservative and working on spasms and ROM.  She is reporting more left shoulder pain today as she did a lot of activities yesterday with shopping preparing for [employer]  Patient saw surgeon, the bone scan said did not feel that there was infection or loosening however the surgeon is not so sure, he wants to do an US  to be sure, that is scheduled for the next few weeks, since she has not been to PT in a month I re-measured her ROM, she has lost at least 15 degrees of motion and more for all motions.  She  is also having more pain in the left shoulder possibly from overdoing it, because she is trying to not use the right shoulder.  She has talked about this with her primary and I suggested that she see a sports medicine MD, she called while she was here and will have  appointment with him next week.  As far as the right shoulder the Surgeon stated in his notes to resume formal PT on a conservative basis to help with ROM and pain until the US  is performed   OBJECTIVE IMPAIRMENTS: cardiopulmonary status limiting activity, decreased activity tolerance, decreased endurance, decreased ROM, decreased strength, increased edema, increased muscle spasms, impaired flexibility, impaired UE functional use, improper body mechanics, postural dysfunction, and pain.  REHAB POTENTIAL: Good  CLINICAL DECISION MAKING: Evolving/moderate complexity  EVALUATION COMPLEXITY: Low   GOALS: Goals reviewed with patient? Yes  SHORT TERM GOALS: Target date: 01/01/23  Independent with initial HEP Goal status: 12/27/22 MET  LONG TERM GOALS: Target date: 03/22/23  Decrease pain 50% Goal status: progressing 10/03/23  2.  Dress without difficulty Goal status: progressing 10/03/23  3.  Do hair without difficulty Goal status: able to reach the top of head at times progressing 2/20/253/25/25 4.  Increase AROM right shoulder flexion to 130 degrees Goal status: 07/25/23 progressing 130 degrees with pain, since March has regressed but is better since march6/3/25  5.  Increase right shoulder ER to 60 degrees Goal status: met 07/06/23  6.  Return to water  aerobics and or gym activity Goal status:progressing met 06/01/23  PLAN:  PT FREQUENCY: 1-2x/week  PT DURATION: 12 weeks  PLANNED INTERVENTIONS: Therapeutic exercises, Therapeutic activity, Neuromuscular re-education, Balance training, Gait training, Patient/Family education, Self Care, Joint mobilization, Dry Needling, Electrical stimulation, Cryotherapy, Vasopneumatic device, and Manual therapy  PLAN FOR NEXT SESSION:   will resume PT on the right shoulder conservatively to try to get back lost ROM and help with pain until she has US , will evaluate left shoulder soon 10/12/2023, 8:43 AM [facility]: [facility] [address] [facility] [address] [place], [place], [address] Phone: [phone]   Fax:  [phone]

## 2023-10-17 ENCOUNTER — Ambulatory Visit: Admitting: Physical Therapy

## 2023-10-17 ENCOUNTER — Encounter: Payer: Self-pay | Admitting: Physical Therapy

## 2023-10-17 ENCOUNTER — Ambulatory Visit: Admitting: Occupational Therapy

## 2023-10-17 DIAGNOSIS — M6281 Muscle weakness (generalized): Secondary | ICD-10-CM | POA: Diagnosis not present

## 2023-10-17 DIAGNOSIS — R278 Other lack of coordination: Secondary | ICD-10-CM | POA: Diagnosis not present

## 2023-10-17 DIAGNOSIS — M79642 Pain in left hand: Secondary | ICD-10-CM | POA: Diagnosis not present

## 2023-10-17 DIAGNOSIS — M25642 Stiffness of left hand, not elsewhere classified: Secondary | ICD-10-CM

## 2023-10-17 DIAGNOSIS — M25611 Stiffness of right shoulder, not elsewhere classified: Secondary | ICD-10-CM | POA: Diagnosis not present

## 2023-10-17 DIAGNOSIS — M79641 Pain in right hand: Secondary | ICD-10-CM | POA: Diagnosis not present

## 2023-10-17 DIAGNOSIS — M542 Cervicalgia: Secondary | ICD-10-CM

## 2023-10-17 DIAGNOSIS — G8929 Other chronic pain: Secondary | ICD-10-CM

## 2023-10-17 DIAGNOSIS — S42291D Other displaced fracture of upper end of right humerus, subsequent encounter for fracture with routine healing: Secondary | ICD-10-CM | POA: Diagnosis not present

## 2023-10-17 DIAGNOSIS — M25511 Pain in right shoulder: Secondary | ICD-10-CM

## 2023-10-17 DIAGNOSIS — M79601 Pain in right arm: Secondary | ICD-10-CM | POA: Diagnosis not present

## 2023-10-17 DIAGNOSIS — R252 Cramp and spasm: Secondary | ICD-10-CM | POA: Diagnosis not present

## 2023-10-17 DIAGNOSIS — M25641 Stiffness of right hand, not elsewhere classified: Secondary | ICD-10-CM | POA: Diagnosis not present

## 2023-10-17 NOTE — Therapy (Signed)
 OUTPATIENT PHYSICAL THERAPY SHOULDER    Patient Name: Veronica Galvan MRN: [id number] DOB:02-Sep-1955, 68 y.o., female Today's Date: 10/17/2023  END OF SESSION:  PT End of Session - 10/17/23 1649     Visit Number 74    Date for PT Re-Evaluation 11/02/23    Authorization Type [employer] [place]    PT Start Time 1605    PT Stop Time 1702    PT Time Calculation (min) 57 min    Activity Tolerance Patient tolerated treatment well    Behavior During Therapy WFL for tasks assessed/performed          Past Medical History:  Diagnosis Date   Allergy     Anxiety    Arthritis    hands   Cataract    Diabetes mellitus without complication (HCC)    type 2   Family history of adverse reaction to anesthesia    son has malignant hyperthermia, daughter does not daughter recently had c section without problems   GERD (gastroesophageal reflux disease)    Headache    sinus   Hyperlipidemia    Hypertension    PONV (postoperative nausea and vomiting)    nausea only   Ulcer, stomach peptic yrs ago   Past Surgical History:  Procedure Laterality Date   APPENDECTOMY     both hells bone spur repair     both heels with metal clips   both shoulder rotator cuff repair     CESAREAN SECTION     x 1   COLONOSCOPY WITH PROPOFOL  N/A 09/07/2016   Procedure: COLONOSCOPY WITH PROPOFOL ;  Surgeon: Garrett Kallman, MD;  Location: [facility];  Service: Endoscopy;  Laterality: N/A;   colonscopy  06/2011   polyps   ELBOW FRACTURE SURGERY Left    EYE SURGERY     FRACTURE SURGERY     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/10/2022   Procedure: REVERSE SHOULDER ARTHROPLASTY;  Surgeon: Ellard Gunning, MD;  Location: [facility];  Service: Orthopedics;  Laterality: Right;  Please follow in room 6 if able   TUBAL LIGATION     VESICO-VAGINAL FISTULA REPAIR     Patient Active Problem List   Diagnosis Date Noted   Gastroesophageal reflux disease 11/16/2021   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 08/18/2021   Dermatofibroma 08/18/2021   History of malignant neoplasm of skin 08/18/2021   Lentigo 08/18/2021   Melanocytic nevi of trunk 08/18/2021   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   Rosacea 08/18/2021   Actinic keratosis 08/18/2021   Sensorineural hearing loss (SNHL) of left ear with unrestricted hearing of right ear 07/26/2021   Tinnitus of left ear 07/26/2021   Acute recurrent maxillary sinusitis 06/22/2021   Primary hypertension 01/12/2021   Hyperlipidemia associated with type 2 diabetes mellitus (HCC) 01/12/2021   Arthritis 01/12/2021   Type II diabetes mellitus (HCC) 11/25/2019   Ingrown toenail 09/05/2018   Neck pain 01/29/2018   Tick bite 10/24/2017    PCP: Ike Malady, [name]  REFERRING PROVIDER: Alfredo Ano, MD  REFERRING DIAG: s/p right reverse TSA  THERAPY DIAG:  Muscle weakness (generalized)  Chronic right shoulder pain  Stiffness of right shoulder, not elsewhere classified  Cervicalgia  Spasm  Acute pain of right shoulder  Stiffness of left hand, not elsewhere classified  Rationale for Evaluation and Treatment: Rehabilitation  ONSET DATE: 11/05/22  SUBJECTIVE:  SUBJECTIVE STATEMENT:   Patient reports that her neck and arm have been very sore since the last treatment, she is unsure why, she did have the US  today, will see the surgeon tomorrow PAIN:  Are you having pain? Yes: NPRS scale: 5/10 Pain location: right shoulder, HA Pain description: dull ache at rest, sharp with motions Aggravating factors: quick motions, movements pain up to 10/10 Relieving factors: ice, rest, Tylenol  at best 3/10  PRECAUTIONS: Shoulder Protocol in the chart  RED FLAGS: None   WEIGHT BEARING RESTRICTIONS: No  FALLS:  Has patient fallen in last 6 months? Yes. Number  of falls 1  LIVING ENVIRONMENT: Lives with: lives alone Lives in: House/apartment Stairs: No Has following equipment at home: None  OCCUPATION: retired  PLOF: Independent  PATIENT GOALS:dress without difficulty, do hair, have good ROM and less pain  NEXT MD VISIT:   OBJECTIVE:   DIAGNOSTIC FINDINGS:  See above  PATIENT SURVEYS:  FOTO 21  COGNITION: Overall cognitive status: Within functional limits for tasks assessed     SENSATION: WFL  POSTURE: Fwd head, rounded shoulders, elevated and guarded shoulder  UPPER EXTREMITY ROM:   Active ROM Right PROM eval Left AROM  08/31/23 PROM Supine 01/05/23 PROM  01/26/23 PROM 02/09/23 AROM sitting 02/14/23 AROM 02/21/23 AROM  03/02/23 AROM  03/22/23 AAROM 03/28/23 AROM Standing 04/13/23 AROM Standing 05/02/23 AROM  Standing 05/11/23 AROM  Standing 05/25/23 AROM Standing 06/01/23 AROM Standing  07/06/23 AROM  Standing 07/27/23 AROM Standing 08/31/23 AROM Standing  10/03/23  Shoulder flexion 35 90 118  123 90 96 95 100 111 110 112 120 125  129 132 130 105 115  Shoulder extension 5                    Shoulder abduction 20 95 90 92  73 80 83 90 93 90 92 98 100 102  105 95 81 90  Shoulder adduction                     Shoulder internal rotation 15 35            40 40  42 25   Shoulder external rotation 20 65 45 30  41  45 50 53 56 56 60 66 67 70 70  47 60  Elbow flexion 120                    Elbow extension 5                    Wrist flexion                     Wrist extension                     Wrist ulnar deviation                     Wrist radial deviation                     Wrist pronation                     Wrist supination                     (Blank rows = not tested)  UPPER EXTREMITY MMT:  No tested due to recent surgery  MMT Right eval Left eval  Shoulder flexion  Shoulder extension    Shoulder abduction    Shoulder adduction    Shoulder internal rotation    Shoulder external rotation    Middle  trapezius    Lower trapezius    Elbow flexion    Elbow extension    Wrist flexion    Wrist extension    Wrist ulnar deviation    Wrist radial deviation    Wrist pronation    Wrist supination    Grip strength (lbs)    (Blank rows = not tested)  PALPATION:  Very tight and tender in the pectoral, upper trap, the entire right upper arm   TODAY'S TREATMENT:                                                                                                                                         DATE:  10/17/23 Gentle stretch of the neck and shoulders STM to the bilateral upper traps, rhomboids, teres and right pectoral mms Vaso to the right shoulder 35 degrees medium pressure  10/12/23 UBE level 4 x 6 minutes Passive stretch to bilateral shoulders STM to the left shoulder/upper arm STM to the right upper trap, rhomboid, neck and teres. Vaso medium pressure 34 degrees  10/10/23 UBE level 3 x 6 minutes PROM right shoulder to her tolerance all motions STM to the right upper trap, neck and rhomboid Vaso right shoulder medium pressure 34 degrees  10/05/23 UBE level 4 x 5 minutes Supine chest presses Supine flexion with 2# wate bar Passive stretch gentle right shoulder STM to the right upper trap and neck Vaso medium pressure  10/03/23 UBE level 3 x 6 minutes Supine wand chest press Supine wand flexion Passive stretch right shoulder  STM to the right upper arm and pectoral area Vaso 34 degrees right shoulder ROM measured as above  09/29/23 STM to the right upper trap, MFR to the upper trap knots Passive stretch to the right shoulder Vaso medium pressure right shoulder 34 degrees  09/27/23 STM to the right upper trap, the rhomboid and the cervical area Gentle PROM of the right shoulder to her tolerance Vaso medium pressure 34 degrees  09/19/23 Passive stretch of the right and left shoulder STM to the upper traps, neck, rhomboids and upper arms Vaso to the right  shoulder  09/13/23 AROM of the left shoulder today was much improved 110 degrees  Walking with encouragement of normal arm swing and no elevation of the shoulders PROM bilateral shoulders STM to the right upper arm, right upper trap and tight neck Vaso medium pressure  09/07/23 Reviewed US  of the left shoulder and x-rays as patient had questions PROM bilateral shoulders STM to the upper traps, rhomboids, cervical area Vaso to bilateral shoulders medium pressure   09/05/23 Nustep level 3 x 5 minutes UBE level 2 x 3 minutes US  to the left shoulder/upper arm STM to the neck and upper  traps Passive stretch shoulders She then went to OT and after I put her on the VASO  08/31/23 Discussion as noted below, measured both shoulder AROM in standing as noted above STM to the right upper arm and shoulder Passive stretch of the right shoulder to her point of pain Vaso bilateral shoulders for pain medium pressure   08/03/23 Pateint had a lot of questions regarding the bloodwork and the bone scan and what they are looking for, I strongly advised her to follow the MD/PA orders to wear sling and not use the arm, I did my best to answer her questions.  Finished with Vaso medium pressure 34 degrees  07/27/23 Gentle shoulder distractions Gentle PROM to her tolerance, a lot of cues to relax Neural tension stretches of the right UE STM to the upper trap, neck and rhomboid STM to the biceps, deltoid and pectoral area  07/25/23 UBE level 3 x 4 minutes for mm perfusion Lats 15# too painful Seated row 10# Doorway stretch PROM right shoulder all motions to tolerance STM to the tight upper arm and the right pectoral area PATIENT EDUCATION: Education details: poc/hep Person educated: Patient Education method: Programmer, multimedia, Facilities manager, Actor cues, Verbal cues, and Handouts Education comprehension: verbalized understanding  HOME EXERCISE PROGRAM: Access Code: [email or web address] URL:  [email or web address] Date: 12/20/2022 Prepared by: Cherylene Corrente  Exercises - Seated Shoulder Flexion Towel Slide at Table Top  - 2 x daily - 7 x weekly - 2 sets - 10 reps - 3 hold - Seated Shoulder External Rotation PROM on Table  - 2 x daily - 7 x weekly - 2 sets - 10 reps - 3 hold - Seated Elbow Extension and Shoulder External Rotation AAROM at Table with Towel  - 2 x daily - 7 x weekly - 2 sets - 10 reps - 3 hold - Circular Shoulder Pendulum with Table Support  - 2 x daily - 7 x weekly - 2 sets - 10 reps - 3 hold  Access Code: [email or web address] URL: [email or web address] Date: 07/13/2023 Prepared by: Cherylene Corrente  Exercises - Isometric Shoulder Flexion at Wall  - 1 x daily - 7 x weekly - 1 sets - 10 reps - 3 hold - Standing Isometric Shoulder Internal Rotation at Doorway  - 1 x daily - 7 x weekly - 1 sets - 10 reps - 3 hold - Isometric Shoulder Extension at Wall  - 1 x daily - 7 x weekly - 1 sets - 10 reps - 3 hold - Isometric Shoulder Abduction at Wall  - 1 x daily - 7 x weekly - 1 sets - 10 reps - 3 hold - Standing Isometric Shoulder External Rotation with Doorway  - 1 x daily - 7 x weekly - 1 sets - 10 reps - 3 hold  ASSESSMENT:  CLINICAL IMPRESSION:  Patient is awaiting the US  to see if the prosthesis is in place and snug and without issue, I am still being conservative and working on spasms and ROM.  She is reporting more left shoulder pain today as she did a lot of activities yesterday with shopping preparing for [employer]  Patient saw surgeon, the bone scan said did not feel that there was infection or loosening however the surgeon is not so sure, he wants to do an US  to be sure, that is scheduled for the next few weeks, since she has not been to PT in a month I re-measured her ROM, she has lost at least 15 degrees of motion and more  for all motions.  She is also having more pain in the left shoulder possibly from overdoing it, because she is trying to not  use the right shoulder.  She has talked about this with her primary and I suggested that she see a sports medicine MD, she called while she was here and will have appointment with him next week.  As far as the right shoulder the Surgeon stated in his notes to resume formal PT on a conservative basis to help with ROM and pain until the US  is performed   OBJECTIVE IMPAIRMENTS: cardiopulmonary status limiting activity, decreased activity tolerance, decreased endurance, decreased ROM, decreased strength, increased edema, increased muscle spasms, impaired flexibility, impaired UE functional use, improper body mechanics, postural dysfunction, and pain.  REHAB POTENTIAL: Good  CLINICAL DECISION MAKING: Evolving/moderate complexity  EVALUATION COMPLEXITY: Low   GOALS: Goals reviewed with patient? Yes  SHORT TERM GOALS: Target date: 01/01/23  Independent with initial HEP Goal status: 12/27/22 MET  LONG TERM GOALS: Target date: 03/22/23  Decrease pain 50% Goal status: progressing 10/03/23  2.  Dress without difficulty Goal status: progressing 10/03/23  3.  Do hair without difficulty Goal status: able to reach the top of head at times progressing 2/20/253/25/25 4.  Increase AROM right shoulder flexion to 130 degrees Goal status: 07/25/23 progressing 130 degrees with pain, since March has regressed but is better since march6/3/25  5.  Increase right shoulder ER to 60 degrees Goal status: met 07/06/23  6.  Return to water  aerobics and or gym activity Goal status:progressing met 06/01/23  PLAN:  PT FREQUENCY: 1-2x/week  PT DURATION: 12 weeks  PLANNED INTERVENTIONS: Therapeutic exercises, Therapeutic activity, Neuromuscular re-education, Balance training, Gait training, Patient/Family education, Self Care, Joint mobilization, Dry Needling, Electrical stimulation, Cryotherapy, Vasopneumatic device, and Manual therapy  PLAN FOR NEXT SESSION:   Will see what the surgeon says tomorrow 10/17/2023,  4:51 PM [facility] at [facility] [address] [facility]. [place], [place], [address] Phone: [phone]   Fax:  [phone]

## 2023-10-17 NOTE — Therapy (Signed)
 OUTPATIENT OCCUPATIONAL THERAPY ORTHO Treatment  Patient Name: Veronica Galvan MRN: [id number] DOB:1955-05-11, 68 y.o., female Today's Date: 10/17/2023  PCP: Adra Alanis [name] REFERRING PROVIDER: Adra Alanis [name]  END OF SESSION:  OT End of Session - 10/17/23 1222     Visit Number 27    Number of Visits 41    Date for OT Re-Evaluation 11/07/23    Authorization Type [employer] M[employer]    Authorization Time Period 12 weeks    Authorization - Visit Number 27    Progress Note Due on Visit 36    OT Start Time 1018    OT Stop Time 1100    OT Time Calculation (min) 42 min    Activity Tolerance Patient tolerated treatment well    Behavior During Therapy WFL for tasks assessed/performed                        Past Medical History:  Diagnosis Date   Allergy     Anxiety    Arthritis    hands   Cataract    Diabetes mellitus without complication (HCC)    type 2   Family history of adverse reaction to anesthesia    son has malignant hyperthermia, daughter does not daughter recently had c section without problems   GERD (gastroesophageal reflux disease)    Headache    sinus   Hyperlipidemia    Hypertension    PONV (postoperative nausea and vomiting)    nausea only   Ulcer, stomach peptic yrs ago   Past Surgical History:  Procedure Laterality Date   APPENDECTOMY     both hells bone spur repair     both heels with metal clips   both shoulder rotator cuff repair     CESAREAN SECTION     x 1   COLONOSCOPY WITH PROPOFOL  N/A 09/07/2016   Procedure: COLONOSCOPY WITH PROPOFOL ;  Surgeon: Garrett Kallman, MD;  Location: [facility];  Service: Endoscopy;  Laterality: N/A;   colonscopy  06/2011   polyps   ELBOW FRACTURE SURGERY Left    EYE SURGERY     FRACTURE SURGERY     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/10/2022   Procedure: REVERSE SHOULDER ARTHROPLASTY;  Surgeon: Ellard Gunning, MD;  Location: [facility];  Service:  Orthopedics;  Laterality: Right;  Please follow in room 6 if able   TUBAL LIGATION     VESICO-VAGINAL FISTULA REPAIR     Patient Active Problem List   Diagnosis Date Noted   Gastroesophageal reflux disease 11/16/2021   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 08/18/2021   Dermatofibroma 08/18/2021   History of malignant neoplasm of skin 08/18/2021   Lentigo 08/18/2021   Melanocytic nevi of trunk 08/18/2021   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   Rosacea 08/18/2021   Actinic keratosis 08/18/2021   Sensorineural hearing loss (SNHL) of left ear with unrestricted hearing of right ear 07/26/2021   Tinnitus of left ear 07/26/2021   Acute recurrent maxillary sinusitis 06/22/2021   Primary hypertension 01/12/2021   Hyperlipidemia associated with type 2 diabetes mellitus (HCC) 01/12/2021   Arthritis 01/12/2021   Type II diabetes mellitus (HCC) 11/25/2019   Ingrown toenail 09/05/2018   Neck pain 01/29/2018   Tick bite 10/24/2017    ONSET DATE: 05/19/23  REFERRING DIAG:  Diagnosis  M79.641,M79.642 (ICD-10-CM) - Bilateral hand pain  THERAPY DIAG:  Muscle weakness (generalized)  Stiffness of left hand, not elsewhere classified  Stiffness of right hand, not elsewhere classified  Pain in right hand  Pain in left hand  Rationale for Evaluation and Treatment: Rehabilitation  SUBJECTIVE:   SUBJECTIVE STATEMENT: Pt reports  her hands felt looser after last OT session Pt accompanied by: self  PERTINENT HISTORY: 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 Yes: NPRS scale: 7/10- right hand Pain location: right hand,  3/10 left hand Pain description: aching, stiff Aggravating factors: gripping  Relieving  factors: heat, meds Pt also has shoulder pain which is more significant grossly R shoulder 8/10 L shoulder pain 3/10 which OT will not address. PT is addressing shoulder FALLS: Has patient fallen in last 6 months? No  LIVING ENVIRONMENT: Lives with: lives alone Lives in: House/apartment Stairs: yes   PLOF: Independent  PATIENT GOALS: improve functional use of hands and learn adapted strategies for ADLs/IADLs.  NEXT MD VISIT: unknown  OBJECTIVE:  Note: Objective measures were completed at Evaluation unless otherwise noted.  HAND DOMINANCE: Right  ADLs: Overall ADLs: unable to grip credit card to remove from ATM Transfers/ambulation related to ADLs: mod I Eating: drops silverware Grooming: drops toothbrush, uses electric toothbrush, holding comb Upper body dressing: adjusting bra Lower body dressing: difficulty pulling up pants  Toileting: hygeine was difficult initally Bathing: mod I, difficult with shaving Tub shower transfers: walk in shower    FUNCTIONAL OUTCOME MEASURES: Quick Dash: 05/30/23- 75%% disability Quick Dash 08/15/23- 70.5% disability   UPPER EXTREMITY ROM:   RUE wrist flexion/ extension: 70/ 50, LUE wrist flexion/ extension: 70/ 45 Pt demonstrates grossly 50% composite flexion for right and 555 with left.   Active ROM Right eval Left eval  Thumb MCP (0-60) 45 55  Thumb IP (0-80) 25 10  Thumb Radial abd/add (0-55)     Thumb Palmar abd/add (0-45)     Thumb Opposition to Small Finger yes yes   Index MCP (0-90)     Index PIP (0-100)     Index DIP (0-70)      Long MCP (0-90)      Long PIP (0-100)      Long DIP (0-70)      Ring MCP (0-90)      Ring PIP (0-100)      Ring DIP (0-70)      Little MCP (0-90)      Little PIP (0-100)      Little DIP (0-70)      (Blank rows = not tested) 9 hole peg test (08/15/23) RUE 26.70 secs, LUE 23 secs  HAND FUNCTION: Grip strength: Right: 8 lbs; Left: 4 lbs,          08/15/23-  RUE 18, LUE 14 lbs,            08/15/23   Pinch: RUE tip 4lbs, lateral 8 lbs, LUE tip 3 lbs, lateral 8 lbs   SENSATION: Light touch: Impaired index finger LUE  EDEMA: Pt with bony defomities at PIP joints of all digits which pt reports are bone spurs, Pt also has bony deformity at DIP for right thumb, index and 5th digit and LUE small and index fingers  COGNITION: Overall cognitive status: Within functional limits for tasks assessed  OBSERVATIONS: Pleasant female desires to increase functional use of bilateral UE's  , TREATMENT DATE:Paraffin to RUE x 10 mins, no adverse reactions, while therapist performed US  to left hand 3.3 mhz, 0.8 w/cm 2,  20%x 8 mins no adverse reactions for stiffness. Paraffin to LUE x 10 mins, no adverse reactions, while therapist performed US  to right hand 3.3 mhz, 0.8 w/cm 2,  20%x 8 mins no adverse reactions for stiffness.  Gentle soft tissue and joint mobs/ distraction to digits, hand, wrist bilaterally as welll as right forearm, pt reports  hands and wrists are feeling looser at end of session. Passive composite finger flexion to right hand and then individual finger flexion to left hand . Therapist checked grip strength:  RUE 23 lbs LUE 25 lbs, pt demonstrates bilateral improvements and she reports continuing improving functional use.   10/03/23 Paraffin to RUE x 10 mins, no adverse reactions, while therapist performed US  to left hand 3.3 mhz, 0.8 w/cm 2,  20%x 8 mins no adverse reactions for stiffness. Paraffin to LUE x 10 mins, no adverse reactions, while therapist performed US  to right hand 3.3 mhz, 0.8 w/cm 2,  20%x 8 mins no adverse reactions for stiffness.  Passive composite finger flexion to right hand and then individual finger flexion to left hand . Gentle soft tissue and joint mobs/ distraction to digits, hand, wrist bilaterally as welll as right foream, pt reports feeling looser Resistive gripping with  tree squeeze ball left and right UE's digi flex, light resistance for composite  and individual finger flexion for LUE   PATIENT EDUCATION: Education details:   see above Person educated: Patient Education method: Explanation, demonstration, v.c Education comprehension: verbalized understanding, returned demonstration  HOME EXERCISE PROGRAM: AROM yellow putty  GOALS: Goals reviewed with patient? Yes  SHORT TERM GOALS: Target date: 09/14/23  I with HEP  Goal status:  met 06/07/23  2.  I with positioning/ splinting prn to minimize pain and defomity   Goal status: met, 07/13/23  3.  Pt will increase bilateral grip strength by 5 lbs for increased UE functional use. Upgraded goal Pt will increase bilateral grip strength to at least 25 lbs (after stretching). Baseline: 05/30/23- RUE 8 lbs, LUE 4 lbs Goal status:met 08/01/23-R 20 lbs, L  19 lbs,  08/15/23- RUE 18 lbsLUE 14 lbs continue to address upgraded goal  - RUE 19 lbs, LUE 24- 10/03/23  RUE 23 lbs LUE 25 lbs, met for LUE, ongoing for right. 10/17/23  4. Pt will demonstrate improved RUE fine motor coordination as evidenced by perfroming 9 hole peg test in 23 secs or less.  Goal status: ongoing 10/17/23 5. I with updates to HEP.  Goal status: ongoing 10/17/23 6. Pt will demonstrate improved composite finger flexion for ADLs as evidenced by pt. bringing middle fingertip for RUE within 1/2 an inch of palm.  Baseline: 3/4 inch from palm to middle fingertip  Goal status : ongoing, 3/4 inch 09/19/23 7.Pt will demonstrate improved composite finger flexion for ADLs as evidenced by pt. bringing middle fingertip for LUE within 3/4 an inch of palm.  Baseline: 1 inch from palm to middle finger tip  Goal status: met, 3/4 inch away from middle finger 09/19/23  LONG TERM GOALS: Target date:11/07/23  I with updated HEP  Goal status:met 08/15/22  2.  Pt will improve Quick Dash score to 70% disability Baseline: 75% (05/30/23) Goal status:   improved - however not fully met due to complications from shoulder pain-70.5%08/15/23  3.  Pt will demonstrate at least 65% composte flexion for LUE for increased functional use.  Goal status: met 65-70% , 08/15/23  4.  I with adapted strategies/ adapted equipment to minimize pain and to increase pt I with ADLs/IADLs  Goal status:  met for inital strategies, however continue goal as pt may report additional tasks that can benefit from modification.ongoing 10/03/23  5.  Pt will demonstrate at least 65% composite flexion for RUE for increased functional use.  Goal status: met 70% 08/15/23  6. Pt will report that she is able to cut her meat such as steak or chicken modified independently.  [place]: unable  Goal status: met 09/21/23    7. Pt will report increased ease with opening/ closing ziplock bags.    Goal status: met 09/21/23    8. Pt will improve bilateral tip pinch by 2 lbs for increased ease with daily activities.     Goal status: ongoing 09/19/23    9. Pt will improve bilateral lateral pinch by 2 lbs for increased ease with ADLs.    Goal status: ongoing 09/19/23   ASSESSMENT: CLINICAL IMPRESSION: Pt demonstrates improving flexibility and grip strength.PERFORMANCE DEFICITS: in functional skills including ADLs, IADLs, coordination, sensation, edema, ROM, strength, pain, flexibility, Fine motor control, Gross motor control, endurance, decreased knowledge of precautions, decreased knowledge of use of DME, and UE functional use,, and psychosocial skills including coping strategies, environmental adaptation, habits, interpersonal interactions, and routines and behaviors.   IMPAIRMENTS: are limiting patient from ADLs, IADLs, rest and sleep, education, play, leisure, and social participation.   COMORBIDITIES: may have co-morbidities  that affects occupational performance. Patient will benefit from skilled OT to address above impairments and improve overall function.  MODIFICATION OR ASSISTANCE TO COMPLETE EVALUATION: No modification of tasks or assist necessary to complete an  evaluation.  OT OCCUPATIONAL PROFILE AND HISTORY: Detailed assessment: Review of records and additional review of physical, cognitive, psychosocial history related to current functional performance.  CLINICAL DECISION MAKING: LOW - limited treatment options, no task modification necessary  REHAB POTENTIAL: Good  EVALUATION COMPLEXITY: Low      PLAN:  OT FREQUENCY: 2x/week  OT DURATION: 12 weeks( anticipate d/c after 8 weeks dependent on progress)  PLANNED INTERVENTIONS: 09604 OT Re-evaluation, 97535 self care/ADL training, 54098 therapeutic exercise, 97530 therapeutic activity, 97112 neuromuscular re-education, 97140 manual therapy, 97113 aquatic therapy, 97035 ultrasound, 97018 paraffin, 11914 fluidotherapy, 97010 moist heat, 97010 cryotherapy, 97034 contrast bath, 97760 Orthotics management and training, 78295 Splinting (initial encounter), [id number] Subsequent splinting/medication, passive range of motion, energy conservation, coping strategies training, patient/family education, and DME and/or AE instructions  RECOMMENDED OTHER SERVICES: none  CONSULTED AND AGREED WITH PLAN OF CARE: Patient  PLAN FOR NEXT SESSION:  check 9 hole peg test, functional use and ROM for bilateral hands   Jazmon Kos, OT 10/17/2023, 12:26 PM

## 2023-10-18 DIAGNOSIS — Z96611 Presence of right artificial shoulder joint: Secondary | ICD-10-CM | POA: Diagnosis not present

## 2023-10-18 DIAGNOSIS — Z471 Aftercare following joint replacement surgery: Secondary | ICD-10-CM | POA: Diagnosis not present

## 2023-10-19 ENCOUNTER — Encounter: Payer: Self-pay | Admitting: Physical Therapy

## 2023-10-19 ENCOUNTER — Ambulatory Visit: Admitting: Physical Therapy

## 2023-10-19 DIAGNOSIS — M542 Cervicalgia: Secondary | ICD-10-CM | POA: Diagnosis not present

## 2023-10-19 DIAGNOSIS — M79641 Pain in right hand: Secondary | ICD-10-CM | POA: Diagnosis not present

## 2023-10-19 DIAGNOSIS — G8929 Other chronic pain: Secondary | ICD-10-CM

## 2023-10-19 DIAGNOSIS — M25642 Stiffness of left hand, not elsewhere classified: Secondary | ICD-10-CM

## 2023-10-19 DIAGNOSIS — R278 Other lack of coordination: Secondary | ICD-10-CM | POA: Diagnosis not present

## 2023-10-19 DIAGNOSIS — M25611 Stiffness of right shoulder, not elsewhere classified: Secondary | ICD-10-CM | POA: Diagnosis not present

## 2023-10-19 DIAGNOSIS — M6281 Muscle weakness (generalized): Secondary | ICD-10-CM | POA: Diagnosis not present

## 2023-10-19 DIAGNOSIS — M25511 Pain in right shoulder: Secondary | ICD-10-CM | POA: Diagnosis not present

## 2023-10-19 DIAGNOSIS — M79642 Pain in left hand: Secondary | ICD-10-CM | POA: Diagnosis not present

## 2023-10-19 DIAGNOSIS — R252 Cramp and spasm: Secondary | ICD-10-CM | POA: Diagnosis not present

## 2023-10-19 DIAGNOSIS — M25641 Stiffness of right hand, not elsewhere classified: Secondary | ICD-10-CM | POA: Diagnosis not present

## 2023-10-19 NOTE — Therapy (Signed)
 OUTPATIENT PHYSICAL THERAPY SHOULDER    Patient Name: Veronica Galvan MRN: [id number] DOB:03-Sep-1955, 68 y.o., female Today's Date: 10/19/2023  END OF SESSION:  PT End of Session - 10/19/23 1552     Visit Number 75    Date for PT Re-Evaluation 11/02/23    Authorization Type [employer] [place]    PT Start Time 1530    PT Stop Time 1615    PT Time Calculation (min) 45 min    Activity Tolerance Patient tolerated treatment well    Behavior During Therapy WFL for tasks assessed/performed          Past Medical History:  Diagnosis Date   Allergy     Anxiety    Arthritis    hands   Cataract    Diabetes mellitus without complication (HCC)    type 2   Family history of adverse reaction to anesthesia    son has malignant hyperthermia, daughter does not daughter recently had c section without problems   GERD (gastroesophageal reflux disease)    Headache    sinus   Hyperlipidemia    Hypertension    PONV (postoperative nausea and vomiting)    nausea only   Ulcer, stomach peptic yrs ago   Past Surgical History:  Procedure Laterality Date   APPENDECTOMY     both hells bone spur repair     both heels with metal clips   both shoulder rotator cuff repair     CESAREAN SECTION     x 1   COLONOSCOPY WITH PROPOFOL  N/A 09/07/2016   Procedure: COLONOSCOPY WITH PROPOFOL ;  Surgeon: Garrett Kallman, MD;  Location: [facility];  Service: Endoscopy;  Laterality: N/A;   colonscopy  06/2011   polyps   ELBOW FRACTURE SURGERY Left    EYE SURGERY     FRACTURE SURGERY     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/10/2022   Procedure: REVERSE SHOULDER ARTHROPLASTY;  Surgeon: Ellard Gunning, MD;  Location: [facility];  Service: Orthopedics;  Laterality: Right;  Please follow in room 6 if able   TUBAL LIGATION     VESICO-VAGINAL FISTULA REPAIR     Patient Active Problem List   Diagnosis Date Noted   Gastroesophageal reflux disease 11/16/2021   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 08/18/2021   Dermatofibroma 08/18/2021   History of malignant neoplasm of skin 08/18/2021   Lentigo 08/18/2021   Melanocytic nevi of trunk 08/18/2021   Nevus lipomatosus cutaneous superficialis 08/18/2021   Rosacea 08/18/2021   Actinic keratosis 08/18/2021   Sensorineural hearing loss (SNHL) of left ear with unrestricted hearing of right ear 07/26/2021   Tinnitus of left ear 07/26/2021   Acute recurrent maxillary sinusitis 06/22/2021   Primary hypertension 01/12/2021   Hyperlipidemia associated with type 2 diabetes mellitus (HCC) 01/12/2021   Arthritis 01/12/2021   Type II diabetes mellitus (HCC) 11/25/2019   Ingrown toenail 09/05/2018   Neck pain 01/29/2018   Tick bite 10/24/2017    PCP: Ike Malady, [name]  REFERRING PROVIDER: Alfredo Ano, MD  REFERRING DIAG: s/p right reverse TSA  THERAPY DIAG:  Muscle weakness (generalized)  Stiffness of left hand, not elsewhere classified  Chronic right shoulder pain  Stiffness of right shoulder, not elsewhere classified  Cervicalgia  Rationale for Evaluation and Treatment: Rehabilitation  ONSET DATE: 11/05/22  SUBJECTIVE:  SUBJECTIVE STATEMENT:   Patient reports that she saw the surgeon she is frustrated and upset as she feels that she still has no answers, it is felt that there is no loosening and no infection.  She is still hurting with basic ADL's, her AROM decreased beginning in March, as she started having increased pain in mid January after a very busy and active few month, she has an order for the left shoulder and the okay for us  to see her for her right shoulder for another month PAIN:  Are you having pain? Yes: NPRS scale: 5/10 Pain location: right shoulder, HA Pain description: dull ache at rest, sharp with motions Aggravating factors: quick  motions, movements pain up to 10/10 Relieving factors: ice, rest, Tylenol  at best 3/10  PRECAUTIONS: Shoulder Protocol in the chart  RED FLAGS: None   WEIGHT BEARING RESTRICTIONS: No  FALLS:  Has patient fallen in last 6 months? Yes. Number of falls 1  LIVING ENVIRONMENT: Lives with: lives alone Lives in: House/apartment Stairs: No Has following equipment at home: None  OCCUPATION: retired  PLOF: Independent  PATIENT GOALS:dress without difficulty, do hair, have good ROM and less pain  NEXT MD VISIT:   OBJECTIVE:   DIAGNOSTIC FINDINGS:  See above  PATIENT SURVEYS:  FOTO 21  COGNITION: Overall cognitive status: Within functional limits for tasks assessed     SENSATION: WFL  POSTURE: Fwd head, rounded shoulders, elevated and guarded shoulder  UPPER EXTREMITY ROM:   Active ROM Right PROM eval Left AROM  08/31/23 PROM Supine 01/05/23 PROM  01/26/23 PROM 02/09/23 AROM sitting 02/14/23 AROM 02/21/23 AROM  03/02/23 AROM  03/22/23 AAROM 03/28/23 AROM Standing 04/13/23 AROM Standing 05/02/23 AROM  Standing 05/11/23 AROM  Standing 05/25/23 AROM Standing 06/01/23 AROM Standing  07/06/23 AROM  Standing 07/27/23 AROM Standing 08/31/23 AROM Standing  10/03/23 AROM 10/19/23  Shoulder flexion 35 90 118  123 90 96 95 100 111 110 112 120 125  129 132 130 105 115 110  Shoulder extension 5                     Shoulder abduction 20 95 90 92  73 80 83 90 93 90 92 98 100 102  105 95 81 90   Shoulder adduction                      Shoulder internal rotation 15 35            40 40  42 25    Shoulder external rotation 20 65 45 30  41  45 50 53 56 56 60 66 67 70 70  47 60   Elbow flexion 120                     Elbow extension 5                     Wrist flexion                      Wrist extension                      Wrist ulnar deviation                      Wrist radial deviation                      Wrist  pronation                      Wrist supination                       (Blank rows = not tested)  UPPER EXTREMITY MMT:  No tested due to recent surgery  MMT Right eval Left eval  Shoulder flexion    Shoulder extension    Shoulder abduction    Shoulder adduction    Shoulder internal rotation    Shoulder external rotation    Middle trapezius    Lower trapezius    Elbow flexion    Elbow extension    Wrist flexion    Wrist extension    Wrist ulnar deviation    Wrist radial deviation    Wrist pronation    Wrist supination    Grip strength (lbs)    (Blank rows = not tested)  PALPATION:  Very tight and tender in the pectoral, upper trap, the entire right upper arm   TODAY'S TREATMENT:                                                                                                                                         DATE:  10/19/23 Discussion of the plan the surgeon gave her and the plan that I feel we will do, we measured the ROM and looked at anatomy and what the MD notes say as she had a lot of questions. See plan below Some STM to the right upper trap  10/17/23 Gentle stretch of the neck and shoulders STM to the bilateral upper traps, rhomboids, teres and right pectoral mms Vaso to the right shoulder 35 degrees medium pressure  10/12/23 UBE level 4 x 6 minutes Passive stretch to bilateral shoulders STM to the left shoulder/upper arm STM to the right upper trap, rhomboid, neck and teres. Vaso medium pressure 34 degrees  10/10/23 UBE level 3 x 6 minutes PROM right shoulder to her tolerance all motions STM to the right upper trap, neck and rhomboid Vaso right shoulder medium pressure 34 degrees  10/05/23 UBE level 4 x 5 minutes Supine chest presses Supine flexion with 2# wate bar Passive stretch gentle right shoulder STM to the right upper trap and neck Vaso medium pressure  10/03/23 UBE level 3 x 6 minutes Supine wand chest press Supine wand flexion Passive stretch right shoulder  STM to the right upper arm and pectoral  area Vaso 34 degrees right shoulder ROM measured as above PATIENT EDUCATION: Education details: poc/hep Person educated: Patient Education method: Programmer, multimedia, Facilities manager, Actor cues, Verbal cues, and Handouts Education comprehension: verbalized understanding  HOME EXERCISE PROGRAM: Access Code: [email or web address] URL: [email or web address] Date: 12/20/2022 Prepared by: Cherylene Corrente  Exercises - Seated Shoulder Flexion Towel Slide at Table Top  - 2 x daily - 7 x weekly - 2 sets -  10 reps - 3 hold - Seated Shoulder External Rotation PROM on Table  - 2 x daily - 7 x weekly - 2 sets - 10 reps - 3 hold - Seated Elbow Extension and Shoulder External Rotation AAROM at Table with Towel  - 2 x daily - 7 x weekly - 2 sets - 10 reps - 3 hold - Circular Shoulder Pendulum with Table Support  - 2 x daily - 7 x weekly - 2 sets - 10 reps - 3 hold  Access Code: [email or web address] URL: [email or web address] Date: 07/13/2023 Prepared by: Cherylene Corrente  Exercises - Isometric Shoulder Flexion at Wall  - 1 x daily - 7 x weekly - 1 sets - 10 reps - 3 hold - Standing Isometric Shoulder Internal Rotation at Doorway  - 1 x daily - 7 x weekly - 1 sets - 10 reps - 3 hold - Isometric Shoulder Extension at Wall  - 1 x daily - 7 x weekly - 1 sets - 10 reps - 3 hold - Isometric Shoulder Abduction at Wall  - 1 x daily - 7 x weekly - 1 sets - 10 reps - 3 hold - Standing Isometric Shoulder External Rotation with Doorway  - 1 x daily - 7 x weekly - 1 sets - 10 reps - 3 hold  ASSESSMENT:  CLINICAL IMPRESSION:  Patient frustrated with what she found out with the surgeon, no loosening and no infection, she want to know why the pain and the loss of ROM, she reports difficulty eating a salad due to fatigue and pain and difficulty dressing and doing hair, we did a chart review of how she was progressing pretty well with pain until about mid January, this is when her pain became much worse, she was  extremely active prior to this, her ROM peaked in early March, she then lost ROM we are slowly gaining back the ROM but she is still very painful and fatigues with ADL's.  Surgeon note suggest one more month of PT and then stop  Patient saw surgeon, the bone scan said did not feel that there was infection or loosening however the surgeon is not so sure, he wants to do an US  to be sure, that is scheduled for the next few weeks, since she has not been to PT in a month I re-measured her ROM, she has lost at least 15 degrees of motion and more for all motions.  She is also having more pain in the left shoulder possibly from overdoing it, because she is trying to not use the right shoulder.  She has talked about this with her primary and I suggested that she see a sports medicine MD, she called while she was here and will have appointment with him next week.  As far as the right shoulder the Surgeon stated in his notes to resume formal PT on a conservative basis to help with ROM and pain until the US  is performed   OBJECTIVE IMPAIRMENTS: cardiopulmonary status limiting activity, decreased activity tolerance, decreased endurance, decreased ROM, decreased strength, increased edema, increased muscle spasms, impaired flexibility, impaired UE functional use, improper body mechanics, postural dysfunction, and pain.  REHAB POTENTIAL: Good  CLINICAL DECISION MAKING: Evolving/moderate complexity  EVALUATION COMPLEXITY: Low   GOALS: Goals reviewed with patient? Yes  SHORT TERM GOALS: Target date: 01/01/23  Independent with initial HEP Goal status: 12/27/22 MET  LONG TERM GOALS: Target date: 03/22/23  Decrease pain 50% Goal status: progressing 10/03/23  2.  Dress without  difficulty Goal status: progressing 10/03/23  3.  Do hair without difficulty Goal status: able to reach the top of head at times progressing 2/20/253/25/25 4.  Increase AROM right shoulder flexion to 130 degrees Goal status: 07/25/23  progressing 130 degrees with pain, since March has regressed but is better since march6/3/25  5.  Increase right shoulder ER to 60 degrees Goal status: met 07/06/23  6.  Return to water  aerobics and or gym activity Goal status:progressing met 06/01/23  PLAN:  PT FREQUENCY: 1-2x/week  PT DURATION: 12 weeks  PLANNED INTERVENTIONS: Therapeutic exercises, Therapeutic activity, Neuromuscular re-education, Balance training, Gait training, Patient/Family education, Self Care, Joint mobilization, Dry Needling, Electrical stimulation, Cryotherapy, Vasopneumatic device, and Manual therapy  PLAN FOR NEXT SESSION:   See patient over the next month, will look to D/C at the end of July, and then have her take time off and see how she does 10/19/2023, 3:53 PM [facility] at [facility] [address] [facility] [place], [place], [address] Phone: [phone]   Fax:  [phone]

## 2023-10-24 ENCOUNTER — Ambulatory Visit: Admitting: Occupational Therapy

## 2023-10-24 ENCOUNTER — Encounter: Payer: Self-pay | Admitting: Occupational Therapy

## 2023-10-24 DIAGNOSIS — M25611 Stiffness of right shoulder, not elsewhere classified: Secondary | ICD-10-CM | POA: Diagnosis not present

## 2023-10-24 DIAGNOSIS — R278 Other lack of coordination: Secondary | ICD-10-CM | POA: Diagnosis not present

## 2023-10-24 DIAGNOSIS — M79641 Pain in right hand: Secondary | ICD-10-CM | POA: Diagnosis not present

## 2023-10-24 DIAGNOSIS — M6281 Muscle weakness (generalized): Secondary | ICD-10-CM

## 2023-10-24 DIAGNOSIS — R252 Cramp and spasm: Secondary | ICD-10-CM | POA: Diagnosis not present

## 2023-10-24 DIAGNOSIS — M542 Cervicalgia: Secondary | ICD-10-CM | POA: Diagnosis not present

## 2023-10-24 DIAGNOSIS — M25511 Pain in right shoulder: Secondary | ICD-10-CM | POA: Diagnosis not present

## 2023-10-24 DIAGNOSIS — M79642 Pain in left hand: Secondary | ICD-10-CM | POA: Diagnosis not present

## 2023-10-24 DIAGNOSIS — M25641 Stiffness of right hand, not elsewhere classified: Secondary | ICD-10-CM | POA: Diagnosis not present

## 2023-10-24 DIAGNOSIS — G8929 Other chronic pain: Secondary | ICD-10-CM | POA: Diagnosis not present

## 2023-10-24 DIAGNOSIS — M25642 Stiffness of left hand, not elsewhere classified: Secondary | ICD-10-CM | POA: Diagnosis not present

## 2023-10-24 NOTE — Therapy (Signed)
 OUTPATIENT OCCUPATIONAL THERAPY ORTHO Treatment  Patient Name: Veronica Galvan MRN: [id number] DOB:1955-10-11, 68 y.o., female Today's Date: 10/24/2023  PCP: Leita Eliza Elbe [name] REFERRING PROVIDER: Leita Eliza Elbe [name]  END OF SESSION:                  Past Medical History:  Diagnosis Date   Allergy     Anxiety    Arthritis    hands   Cataract    Diabetes mellitus without complication (HCC)    type 2   Family history of adverse reaction to anesthesia    son has malignant hyperthermia, daughter does not daughter recently had c section without problems   GERD (gastroesophageal reflux disease)    Headache    sinus   Hyperlipidemia    Hypertension    PONV (postoperative nausea and vomiting)    nausea only   Ulcer, stomach peptic yrs ago   Past Surgical History:  Procedure Laterality Date   APPENDECTOMY     both hells bone spur repair     both heels with metal clips   both shoulder rotator cuff repair     CESAREAN SECTION     x 1   COLONOSCOPY WITH PROPOFOL  N/A 09/07/2016   Procedure: COLONOSCOPY WITH PROPOFOL ;  Surgeon: Vicci Gladis POUR, MD;  Location: [facility];  Service: Endoscopy;  Laterality: N/A;   colonscopy  06/2011   polyps   ELBOW FRACTURE SURGERY Left    EYE SURGERY     FRACTURE SURGERY     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/10/2022   Procedure: REVERSE SHOULDER ARTHROPLASTY;  Surgeon: Melita Drivers, MD;  Location: [facility];  Service: Orthopedics;  Laterality: Right;  Please follow in room 6 if able   TUBAL LIGATION     VESICO-VAGINAL FISTULA REPAIR     Patient Active Problem List   Diagnosis Date Noted   Gastroesophageal reflux disease 11/16/2021   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 08/18/2021   Dermatofibroma 08/18/2021   History of malignant neoplasm of skin 08/18/2021   Lentigo 08/18/2021   Melanocytic nevi of trunk 08/18/2021   Nevus lipomatosus  cutaneous superficialis 08/18/2021   Rosacea 08/18/2021   Actinic keratosis 08/18/2021   Sensorineural hearing loss (SNHL) of left ear with unrestricted hearing of right ear 07/26/2021   Tinnitus of left ear 07/26/2021   Acute recurrent maxillary sinusitis 06/22/2021   Primary hypertension 01/12/2021   Hyperlipidemia associated with type 2 diabetes mellitus (HCC) 01/12/2021   Arthritis 01/12/2021   Type II diabetes mellitus (HCC) 11/25/2019   Ingrown toenail 09/05/2018   Neck pain 01/29/2018   Tick bite 10/24/2017    ONSET DATE: 05/19/23  REFERRING DIAG:  Diagnosis  M79.641,M79.642 (ICD-10-CM) - Bilateral hand pain    THERAPY DIAG:  Muscle weakness (generalized)  Stiffness of left hand, not elsewhere classified  Stiffness of right hand, not elsewhere classified  Pain in right hand  Pain in left hand  Other lack of coordination  Rationale for Evaluation and Treatment: Rehabilitation  SUBJECTIVE:   SUBJECTIVE STATEMENT: Pt reports  her hands felt looser after last OT session Pt accompanied by: self  PERTINENT HISTORY: 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 Yes: NPRS scale: 4/10 Pain location: right hand,  left hand Pain description: aching, stiff Aggravating factors: gripping  Relieving factors: heat, meds Pt also has shoulder pain which is more significant grossly R shoulder 7/10 L shoulder pain 3/10 which OT will not address. PT is addressing shoulder FALLS: Has patient fallen in last 6 months? No  LIVING ENVIRONMENT: Lives with: lives alone Lives in: House/apartment Stairs: yes   PLOF: Independent  PATIENT GOALS: improve functional use of hands and learn adapted strategies for ADLs/IADLs.  NEXT MD VISIT:  unknown  OBJECTIVE:  Note: Objective measures were completed at Evaluation unless otherwise noted.  HAND DOMINANCE: Right  ADLs: Overall ADLs: unable to grip credit card to remove from ATM Transfers/ambulation related to ADLs: mod I Eating: drops silverware Grooming: drops toothbrush, uses electric toothbrush, holding comb Upper body dressing: adjusting bra Lower body dressing: difficulty pulling up pants  Toileting: hygeine was difficult initally Bathing: mod I, difficult with shaving Tub shower transfers: walk in shower    FUNCTIONAL OUTCOME MEASURES: Quick Dash: 05/30/23- 75%% disability Quick Dash 08/15/23- 70.5% disability   UPPER EXTREMITY ROM:   RUE wrist flexion/ extension: 70/ 50, LUE wrist flexion/ extension: 70/ 45 Pt demonstrates grossly 50% composite flexion for right and 555 with left.   Active ROM Right eval Left eval  Thumb MCP (0-60) 45 55  Thumb IP (0-80) 25 10  Thumb Radial abd/add (0-55)     Thumb Palmar abd/add (0-45)     Thumb Opposition to Small Finger yes yes   Index MCP (0-90)     Index PIP (0-100)     Index DIP (0-70)      Long MCP (0-90)      Long PIP (0-100)      Long DIP (0-70)      Ring MCP (0-90)      Ring PIP (0-100)      Ring DIP (0-70)      Little MCP (0-90)      Little PIP (0-100)      Little DIP (0-70)      (Blank rows = not tested) 9 hole peg test (08/15/23) RUE 26.70 secs, LUE 23 secs  HAND FUNCTION: Grip strength: Right: 8 lbs; Left: 4 lbs,          08/15/23-  RUE 18, LUE 14 lbs,           08/15/23   Pinch: RUE tip 4lbs, lateral 8 lbs, LUE tip 3 lbs, lateral 8 lbs   SENSATION: Light touch: Impaired index finger LUE  EDEMA: Pt with bony defomities at PIP joints of all digits which pt reports are bone spurs, Pt also has bony deformity at DIP for right thumb, index and 5th digit and LUE small and index fingers  COGNITION: Overall cognitive status: Within functional limits for tasks assessed   OBSERVATIONS: Pleasant  female desires to increase functional use of bilateral UE's  , TREATMENT DATE:10/24/23-Paraffin to RUE x 10 mins, no adverse reactions, while therapist performed US  to left hand 3.3 mhz, 0.8 w/cm 2,  20%x 8 mins no adverse reactions for stiffness. Paraffin to LUE x 10 mins, no adverse reactions, while therapist performed US  to right hand 3.3 mhz, 0.8 w/cm 2,  20%x 8 mins no adverse reactions for stiffness.  Gentle soft tissue and joint mobs/ distraction to digits, hand, wrist bilaterally as welll as right forearm,  Passive finger flexion to bilateral hands. Therpist checked 9 hole peg test,  see goals. 10/17/23-Paraffin to RUE x 10 mins, no adverse reactions, while therapist performed US  to left hand 3.3 mhz, 0.8 w/cm 2,  20%x 8 mins no adverse reactions for stiffness. Paraffin to LUE x 10 mins, no adverse reactions, while therapist performed US  to right hand 3.3 mhz, 0.8 w/cm 2,  20%x 8 mins no adverse reactions for stiffness.  Gentle soft tissue and joint mobs/ distraction to digits, hand, wrist bilaterally as welll as right forearm, pt reports  hands and wrists are feeling looser at end of session. Passive composite finger flexion to right hand and then individual finger flexion to left hand . Therapist checked grip strength:  RUE 23 lbs LUE 25 lbs, pt demonstrates bilateral improvements and she reports continuing improving functional use.   10/03/23 Paraffin to RUE x 10 mins, no adverse reactions, while therapist performed US  to left hand 3.3 mhz, 0.8 w/cm 2,  20%x 8 mins no adverse reactions for stiffness. Paraffin to LUE x 10 mins, no adverse reactions, while therapist performed US  to right hand 3.3 mhz, 0.8 w/cm 2,  20%x 8 mins no adverse reactions for stiffness.  Passive composite finger flexion to right hand and then individual finger flexion to left hand . Gentle soft tissue and joint mobs/ distraction to digits, hand, wrist bilaterally as welll as right foream, pt reports feeling  looser Resistive gripping with  tree squeeze ball left and right UE's digi flex, light resistance for composite and individual finger flexion for LUE   PATIENT EDUCATION: Education details:   see above Person educated: Patient Education method: Explanation, demonstration, v.c Education comprehension: verbalized understanding, returned demonstration  HOME EXERCISE PROGRAM: AROM yellow putty  GOALS: Goals reviewed with patient? Yes  SHORT TERM GOALS: Target date: 09/14/23  I with HEP  Goal status:  met 06/07/23  2.  I with positioning/ splinting prn to minimize pain and defomity   Goal status: met, 07/13/23  3.  Pt will increase bilateral grip strength by 5 lbs for increased UE functional use. Upgraded goal Pt will increase bilateral grip strength to at least 25 lbs (after stretching). Baseline: 05/30/23- RUE 8 lbs, LUE 4 lbs Goal status:met 08/01/23-R 20 lbs, L  19 lbs,  08/15/23- RUE 18 lbsLUE 14 lbs continue to address upgraded goal  - RUE 19 lbs, LUE 24- 10/03/23  RUE 23 lbs LUE 25 lbs, met for LUE, ongoing for right. 10/17/23  4. Pt will demonstrate improved RUE fine motor coordination as evidenced by perfroming 9 hole peg test in 23 secs or less.  Goal status: ongoing 10/17/23, ongoing 26.68- 10/24/23 5. I with updates to HEP.  Goal status: ongoing 10/17/23 6. Pt will demonstrate improved composite finger flexion for ADLs as evidenced by pt. bringing middle fingertip for RUE within 1/2 an inch of palm.  Baseline: 3/4 inch from palm to middle fingertip  Goal status : ongoing, 3/4 inch 09/19/23, 1 inch from palm 10/24/23 7.Pt will demonstrate improved composite finger flexion for ADLs as evidenced by pt. bringing middle fingertip for LUE within 3/4 an inch of palm.  Baseline: 1 inch from palm to middle finger tip  Goal status: met, 3/4 inch away from middle finger 09/19/23  LONG TERM GOALS: Target date:11/07/23  I with updated HEP  Goal status:met 08/15/22  2.  Pt will improve Quick  Dash score to 70% disability Baseline: 75% (05/30/23) Goal status:   improved - however not fully met due to complications from shoulder pain-70.5%08/15/23  3.  Pt will demonstrate  at least 65% composte flexion for LUE for increased functional use.  Goal status: met 65-70% , 08/15/23  4.  I with adapted strategies/ adapted equipment to minimize pain and to increase pt I with ADLs/IADLs  Goal status:  met for inital strategies, however continue goal as pt may report additional tasks that can benefit from modification.ongoing 10/03/23  5.  Pt will demonstrate at least 65% composite flexion for RUE for increased functional use.  Goal status: met 70% 08/15/23  6. Pt will report that she is able to cut her meat such as steak or chicken modified independently.  [place]: unable  Goal status: met 09/21/23    7. Pt will report increased ease with opening/ closing ziplock bags.    Goal status: met 09/21/23    8. Pt will improve bilateral tip pinch by 2 lbs for increased ease with daily activities.     Goal status: ongoing 09/19/23    9. Pt will improve bilateral lateral pinch by 2 lbs for increased ease with ADLs.    Goal status: ongoing 09/19/23   ASSESSMENT: CLINICAL IMPRESSION: Pt reports improved ability to use hands functionally for meal prep during VBS. Today hand pain is 4/10 which is improved overall. PERFORMANCE DEFICITS: in functional skills including ADLs, IADLs, coordination, sensation, edema, ROM, strength, pain, flexibility, Fine motor control, Gross motor control, endurance, decreased knowledge of precautions, decreased knowledge of use of DME, and UE functional use,, and psychosocial skills including coping strategies, environmental adaptation, habits, interpersonal interactions, and routines and behaviors.   IMPAIRMENTS: are limiting patient from ADLs, IADLs, rest and sleep, education, play, leisure, and social participation.   COMORBIDITIES: may have co-morbidities  that affects  occupational performance. Patient will benefit from skilled OT to address above impairments and improve overall function.  MODIFICATION OR ASSISTANCE TO COMPLETE EVALUATION: No modification of tasks or assist necessary to complete an evaluation.  OT OCCUPATIONAL PROFILE AND HISTORY: Detailed assessment: Review of records and additional review of physical, cognitive, psychosocial history related to current functional performance.  CLINICAL DECISION MAKING: LOW - limited treatment options, no task modification necessary  REHAB POTENTIAL: Good  EVALUATION COMPLEXITY: Low      PLAN:  OT FREQUENCY: 2x/week  OT DURATION: 12 weeks( anticipate d/c after 8 weeks dependent on progress)  PLANNED INTERVENTIONS: 02831 OT Re-evaluation, 97535 self care/ADL training, 02889 therapeutic exercise, 97530 therapeutic activity, 97112 neuromuscular re-education, 97140 manual therapy, 97113 aquatic therapy, 97035 ultrasound, 97018 paraffin, 02960 fluidotherapy, 97010 moist heat, 97010 cryotherapy, 97034 contrast bath, 97760 Orthotics management and training, 02239 Splinting (initial encounter), [id number] Subsequent splinting/medication, passive range of motion, energy conservation, coping strategies training, patient/family education, and DME and/or AE instructions  RECOMMENDED OTHER SERVICES: none  CONSULTED AND AGREED WITH PLAN OF CARE: Patient  PLAN FOR NEXT SESSION:   functional use and ROM for bilateral hands   Meghen Akopyan, OT 10/24/2023, 9:13 AM                     OUTPATIENT OCCUPATIONAL THERAPY ORTHO Treatment  Patient Name: Veronica Galvan MRN: [id number] DOB:21-Apr-1956, 68 y.o., female Today's Date: 10/24/2023  PCP: Leita Eliza Elbe [name] REFERRING PROVIDER: Leita Eliza Elbe [name]  END OF SESSION:                  Past Medical History:  Diagnosis Date   Allergy     Anxiety    Arthritis    hands   Cataract    Diabetes mellitus without  complication (HCC)  type 2   Family history of adverse reaction to anesthesia    son has malignant hyperthermia, daughter does not daughter recently had c section without problems   GERD (gastroesophageal reflux disease)    Headache    sinus   Hyperlipidemia    Hypertension    PONV (postoperative nausea and vomiting)    nausea only   Ulcer, stomach peptic yrs ago   Past Surgical History:  Procedure Laterality Date   APPENDECTOMY     both hells bone spur repair     both heels with metal clips   both shoulder rotator cuff repair     CESAREAN SECTION     x 1   COLONOSCOPY WITH PROPOFOL  N/A 09/07/2016   Procedure: COLONOSCOPY WITH PROPOFOL ;  Surgeon: Vicci Gladis POUR, MD;  Location: [facility];  Service: Endoscopy;  Laterality: N/A;   colonscopy  06/2011   polyps   ELBOW FRACTURE SURGERY Left    EYE SURGERY     FRACTURE SURGERY     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/10/2022   Procedure: REVERSE SHOULDER ARTHROPLASTY;  Surgeon: Melita Drivers, MD;  Location: [facility];  Service: Orthopedics;  Laterality: Right;  Please follow in room 6 if able   TUBAL LIGATION     VESICO-VAGINAL FISTULA REPAIR     Patient Active Problem List   Diagnosis Date Noted   Gastroesophageal reflux disease 11/16/2021   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 08/18/2021   Dermatofibroma 08/18/2021   History of malignant neoplasm of skin 08/18/2021   Lentigo 08/18/2021   Melanocytic nevi of trunk 08/18/2021   Nevus lipomatosus cutaneous superficialis 08/18/2021   Rosacea 08/18/2021   Actinic keratosis 08/18/2021   Sensorineural hearing loss (SNHL) of left ear with unrestricted hearing of right ear 07/26/2021   Tinnitus of left ear 07/26/2021   Acute recurrent maxillary sinusitis 06/22/2021   Primary hypertension 01/12/2021   Hyperlipidemia associated with type 2 diabetes mellitus (HCC) 01/12/2021   Arthritis 01/12/2021   Type II diabetes mellitus (HCC) 11/25/2019    Ingrown toenail 09/05/2018   Neck pain 01/29/2018   Tick bite 10/24/2017    ONSET DATE: 05/19/23  REFERRING DIAG:  Diagnosis  M79.641,M79.642 (ICD-10-CM) - Bilateral hand pain    THERAPY DIAG:  Muscle weakness (generalized)  Stiffness of left hand, not elsewhere classified  Stiffness of right hand, not elsewhere classified  Pain in right hand  Pain in left hand  Other lack of coordination  Rationale for Evaluation and Treatment: Rehabilitation  SUBJECTIVE:   SUBJECTIVE STATEMENT: Pt reports  her hands felt looser after last OT session Pt accompanied by: self  PERTINENT HISTORY: 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 Yes: NPRS scale: 7/10- right hand Pain location: right hand,  3/10 left hand Pain description: aching, stiff Aggravating factors: gripping  Relieving factors: heat, meds Pt also has shoulder pain which is more significant grossly R shoulder 8/10 L shoulder pain 3/10 which OT will not address. PT is addressing shoulder FALLS: Has patient fallen in last 6 months? No  LIVING ENVIRONMENT: Lives with: lives alone Lives in: House/apartment Stairs: yes   PLOF: Independent  PATIENT GOALS: improve functional use of hands and learn adapted strategies for ADLs/IADLs.  NEXT MD VISIT: unknown  OBJECTIVE:  Note:  Objective measures were completed at Evaluation unless otherwise noted.  HAND DOMINANCE: Right  ADLs: Overall ADLs: unable to grip credit card to remove from ATM Transfers/ambulation related to ADLs: mod I Eating: drops silverware Grooming: drops toothbrush, uses electric toothbrush, holding comb Upper body dressing: adjusting bra Lower body dressing: difficulty pulling up pants  Toileting: hygeine was  difficult initally Bathing: mod I, difficult with shaving Tub shower transfers: walk in shower    FUNCTIONAL OUTCOME MEASURES: Quick Dash: 05/30/23- 75%% disability Quick Dash 08/15/23- 70.5% disability   UPPER EXTREMITY ROM:   RUE wrist flexion/ extension: 70/ 50, LUE wrist flexion/ extension: 70/ 45 Pt demonstrates grossly 50% composite flexion for right and 555 with left.   Active ROM Right eval Left eval  Thumb MCP (0-60) 45 55  Thumb IP (0-80) 25 10  Thumb Radial abd/add (0-55)     Thumb Palmar abd/add (0-45)     Thumb Opposition to Small Finger yes yes   Index MCP (0-90)     Index PIP (0-100)     Index DIP (0-70)      Long MCP (0-90)      Long PIP (0-100)      Long DIP (0-70)      Ring MCP (0-90)      Ring PIP (0-100)      Ring DIP (0-70)      Little MCP (0-90)      Little PIP (0-100)      Little DIP (0-70)      (Blank rows = not tested) 9 hole peg test (08/15/23) RUE 26.70 secs, LUE 23 secs  HAND FUNCTION: Grip strength: Right: 8 lbs; Left: 4 lbs,          08/15/23-  RUE 18, LUE 14 lbs,           08/15/23   Pinch: RUE tip 4lbs, lateral 8 lbs, LUE tip 3 lbs, lateral 8 lbs   SENSATION: Light touch: Impaired index finger LUE  EDEMA: Pt with bony defomities at PIP joints of all digits which pt reports are bone spurs, Pt also has bony deformity at DIP for right thumb, index and 5th digit and LUE small and index fingers  COGNITION: Overall cognitive status: Within functional limits for tasks assessed   OBSERVATIONS: Pleasant female desires to increase functional use of bilateral UE's  , TREATMENT DATE:Paraffin to RUE x 10 mins, no adverse reactions, while therapist performed US  to left hand 3.3 mhz, 0.8 w/cm 2,  20%x 8 mins no adverse reactions for stiffness. Paraffin to LUE x 10 mins, no adverse reactions, while therapist performed US  to right hand 3.3 mhz, 0.8 w/cm 2,  20%x 8 mins no adverse reactions for stiffness.  Gentle soft tissue and joint mobs/  distraction to digits, hand, wrist bilaterally as welll as right forearm, pt reports  hands and wrists are feeling looser at end of session. Passive composite finger flexion to right hand and then individual finger flexion to left hand . Therapist checked grip strength:  RUE 23 lbs LUE 25 lbs, pt demonstrates bilateral improvements and she reports continuing improving functional use.   10/03/23 Paraffin to RUE x 10 mins, no adverse reactions, while therapist performed US  to left hand 3.3 mhz, 0.8 w/cm 2,  20%x 8 mins no adverse reactions for stiffness. Paraffin to LUE x 10 mins, no adverse reactions, while therapist performed US  to right hand 3.3 mhz, 0.8 w/cm 2,  20%x 8 mins no adverse reactions for stiffness.  Passive  composite finger flexion to right hand and then individual finger flexion to left hand . Gentle soft tissue and joint mobs/ distraction to digits, hand, wrist bilaterally as welll as right foream, pt reports feeling looser Resistive gripping with  tree squeeze ball left and right UE's digi flex, light resistance for composite and individual finger flexion for LUE   PATIENT EDUCATION: Education details:   see above Person educated: Patient Education method: Explanation, demonstration, v.c Education comprehension: verbalized understanding, returned demonstration  HOME EXERCISE PROGRAM: AROM yellow putty  GOALS: Goals reviewed with patient? Yes  SHORT TERM GOALS: Target date: 09/14/23  I with HEP  Goal status:  met 06/07/23  2.  I with positioning/ splinting prn to minimize pain and defomity   Goal status: met, 07/13/23  3.  Pt will increase bilateral grip strength by 5 lbs for increased UE functional use. Upgraded goal Pt will increase bilateral grip strength to at least 25 lbs (after stretching). Baseline: 05/30/23- RUE 8 lbs, LUE 4 lbs Goal status:met 08/01/23-R 20 lbs, L  19 lbs,  08/15/23- RUE 18 lbsLUE 14 lbs continue to address upgraded goal  - RUE 19 lbs, LUE 24-  10/03/23  RUE 23 lbs LUE 25 lbs, met for LUE, ongoing for right. 10/17/23  4. Pt will demonstrate improved RUE fine motor coordination as evidenced by perfroming 9 hole peg test in 23 secs or less.  Goal status: ongoing 10/17/23 5. I with updates to HEP.  Goal status: ongoing 10/17/23 6. Pt will demonstrate improved composite finger flexion for ADLs as evidenced by pt. bringing middle fingertip for RUE within 1/2 an inch of palm.  Baseline: 3/4 inch from palm to middle fingertip  Goal status : ongoing, 3/4 inch 09/19/23 7.Pt will demonstrate improved composite finger flexion for ADLs as evidenced by pt. bringing middle fingertip for LUE within 3/4 an inch of palm.  Baseline: 1 inch from palm to middle finger tip  Goal status: met, 3/4 inch away from middle finger 09/19/23  LONG TERM GOALS: Target date:11/07/23  I with updated HEP  Goal status:met 08/15/22  2.  Pt will improve Quick Dash score to 70% disability Baseline: 75% (05/30/23) Goal status:   improved - however not fully met due to complications from shoulder pain-70.5%08/15/23  3.  Pt will demonstrate at least 65% composte flexion for LUE for increased functional use.  Goal status: met 65-70% , 08/15/23  4.  I with adapted strategies/ adapted equipment to minimize pain and to increase pt I with ADLs/IADLs  Goal status:  met for inital strategies, however continue goal as pt may report additional tasks that can benefit from modification.ongoing 10/03/23  5.  Pt will demonstrate at least 65% composite flexion for RUE for increased functional use.  Goal status: met 70% 08/15/23  6. Pt will report that she is able to cut her meat such as steak or chicken modified independently.  [place]: unable  Goal status: met 09/21/23    7. Pt will report increased ease with opening/ closing ziplock bags.    Goal status: met 09/21/23    8. Pt will improve bilateral tip pinch by 2 lbs for increased ease with daily activities.     Goal status: ongoing  09/19/23    9. Pt will improve bilateral lateral pinch by 2 lbs for increased ease with ADLs.    Goal status: ongoing 09/19/23   ASSESSMENT: CLINICAL IMPRESSION: Pt demonstrates improving flexibility and grip strength.PERFORMANCE DEFICITS: in functional skills including ADLs, IADLs, coordination,  sensation, edema, ROM, strength, pain, flexibility, Fine motor control, Gross motor control, endurance, decreased knowledge of precautions, decreased knowledge of use of DME, and UE functional use,, and psychosocial skills including coping strategies, environmental adaptation, habits, interpersonal interactions, and routines and behaviors.   IMPAIRMENTS: are limiting patient from ADLs, IADLs, rest and sleep, education, play, leisure, and social participation.   COMORBIDITIES: may have co-morbidities  that affects occupational performance. Patient will benefit from skilled OT to address above impairments and improve overall function.  MODIFICATION OR ASSISTANCE TO COMPLETE EVALUATION: No modification of tasks or assist necessary to complete an evaluation.  OT OCCUPATIONAL PROFILE AND HISTORY: Detailed assessment: Review of records and additional review of physical, cognitive, psychosocial history related to current functional performance.  CLINICAL DECISION MAKING: LOW - limited treatment options, no task modification necessary  REHAB POTENTIAL: Good  EVALUATION COMPLEXITY: Low      PLAN:  OT FREQUENCY: 2x/week  OT DURATION: 12 weeks( anticipate d/c after 8 weeks dependent on progress)  PLANNED INTERVENTIONS: 02831 OT Re-evaluation, 97535 self care/ADL training, 02889 therapeutic exercise, 97530 therapeutic activity, 97112 neuromuscular re-education, 97140 manual therapy, 97113 aquatic therapy, 97035 ultrasound, 97018 paraffin, 02960 fluidotherapy, 97010 moist heat, 97010 cryotherapy, 97034 contrast bath, 97760 Orthotics management and training, 02239 Splinting (initial encounter), [id number]  Subsequent splinting/medication, passive range of motion, energy conservation, coping strategies training, patient/family education, and DME and/or AE instructions  RECOMMENDED OTHER SERVICES: none  CONSULTED AND AGREED WITH PLAN OF CARE: Patient  PLAN FOR NEXT SESSION:  check 9 hole peg test, functional use and ROM for bilateral hands   Tiron Suski, OT 10/24/2023, 9:13 AM

## 2023-10-25 ENCOUNTER — Ambulatory Visit: Admitting: Physical Therapy

## 2023-10-25 ENCOUNTER — Encounter: Payer: Self-pay | Admitting: Physical Therapy

## 2023-10-25 DIAGNOSIS — M6281 Muscle weakness (generalized): Secondary | ICD-10-CM

## 2023-10-25 DIAGNOSIS — M25611 Stiffness of right shoulder, not elsewhere classified: Secondary | ICD-10-CM | POA: Diagnosis not present

## 2023-10-25 DIAGNOSIS — M79642 Pain in left hand: Secondary | ICD-10-CM | POA: Diagnosis not present

## 2023-10-25 DIAGNOSIS — M25641 Stiffness of right hand, not elsewhere classified: Secondary | ICD-10-CM | POA: Diagnosis not present

## 2023-10-25 DIAGNOSIS — G8929 Other chronic pain: Secondary | ICD-10-CM | POA: Diagnosis not present

## 2023-10-25 DIAGNOSIS — M25511 Pain in right shoulder: Secondary | ICD-10-CM | POA: Diagnosis not present

## 2023-10-25 DIAGNOSIS — M542 Cervicalgia: Secondary | ICD-10-CM

## 2023-10-25 DIAGNOSIS — R252 Cramp and spasm: Secondary | ICD-10-CM | POA: Diagnosis not present

## 2023-10-25 DIAGNOSIS — R278 Other lack of coordination: Secondary | ICD-10-CM | POA: Diagnosis not present

## 2023-10-25 DIAGNOSIS — M79641 Pain in right hand: Secondary | ICD-10-CM | POA: Diagnosis not present

## 2023-10-25 DIAGNOSIS — M25642 Stiffness of left hand, not elsewhere classified: Secondary | ICD-10-CM | POA: Diagnosis not present

## 2023-10-25 NOTE — Therapy (Signed)
 OUTPATIENT PHYSICAL THERAPY SHOULDER    Patient Name: Veronica Galvan MRN: [id number] DOB:1955-11-04, 68 y.o., female Today's Date: 10/25/2023  END OF SESSION:  PT End of Session - 10/25/23 1102     Visit Number 76    Date for PT Re-Evaluation 11/02/23    Authorization Type [employer] [place]    PT Start Time 1100    PT Stop Time 1200    PT Time Calculation (min) 60 min    Activity Tolerance Patient tolerated treatment well    Behavior During Therapy WFL for tasks assessed/performed          Past Medical History:  Diagnosis Date   Allergy     Anxiety    Arthritis    hands   Cataract    Diabetes mellitus without complication (HCC)    type 2   Family history of adverse reaction to anesthesia    son has malignant hyperthermia, daughter does not daughter recently had c section without problems   GERD (gastroesophageal reflux disease)    Headache    sinus   Hyperlipidemia    Hypertension    PONV (postoperative nausea and vomiting)    nausea only   Ulcer, stomach peptic yrs ago   Past Surgical History:  Procedure Laterality Date   APPENDECTOMY     both hells bone spur repair     both heels with metal clips   both shoulder rotator cuff repair     CESAREAN SECTION     x 1   COLONOSCOPY WITH PROPOFOL  N/A 09/07/2016   Procedure: COLONOSCOPY WITH PROPOFOL ;  Surgeon: Vicci Gladis POUR, MD;  Location: [facility];  Service: Endoscopy;  Laterality: N/A;   colonscopy  06/2011   polyps   ELBOW FRACTURE SURGERY Left    EYE SURGERY     FRACTURE SURGERY     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/10/2022   Procedure: REVERSE SHOULDER ARTHROPLASTY;  Surgeon: Melita Drivers, MD;  Location: [facility];  Service: Orthopedics;  Laterality: Right;  Please follow in room 6 if able   TUBAL LIGATION     VESICO-VAGINAL FISTULA REPAIR     Patient Active Problem List   Diagnosis Date Noted   Gastroesophageal reflux disease 11/16/2021   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 08/18/2021   Dermatofibroma 08/18/2021   History of malignant neoplasm of skin 08/18/2021   Lentigo 08/18/2021   Melanocytic nevi of trunk 08/18/2021   Nevus lipomatosus cutaneous superficialis 08/18/2021   Rosacea 08/18/2021   Actinic keratosis 08/18/2021   Sensorineural hearing loss (SNHL) of left ear with unrestricted hearing of right ear 07/26/2021   Tinnitus of left ear 07/26/2021   Acute recurrent maxillary sinusitis 06/22/2021   Primary hypertension 01/12/2021   Hyperlipidemia associated with type 2 diabetes mellitus (HCC) 01/12/2021   Arthritis 01/12/2021   Type II diabetes mellitus (HCC) 11/25/2019   Ingrown toenail 09/05/2018   Neck pain 01/29/2018   Tick bite 10/24/2017    PCP: [name], [name]  REFERRING PROVIDER: Melita, MD  REFERRING DIAG: s/p right reverse TSA  THERAPY DIAG:  Muscle weakness (generalized)  Chronic right shoulder pain  Stiffness of right shoulder, not elsewhere classified  Cervicalgia  Rationale for Evaluation and Treatment: Rehabilitation  ONSET DATE: 11/05/22  SUBJECTIVE:  SUBJECTIVE STATEMENT:   Patient is doing a vacation bible school this week, she reports that she is trying to delegate more and do less, but still very active  Patient reports that she saw the surgeon she is frustrated and upset as she feels that she still has no answers, it is felt that there is no loosening and no infection.  She is still hurting with basic ADL's, her AROM decreased beginning in March, as she started having increased pain in mid January after a very busy and active few month, she has an order for the left shoulder and the okay for us  to see her for her right shoulder for another month PAIN:  Are you having pain? Yes: NPRS scale: 5/10 Pain location: right shoulder,  HA Pain description: dull ache at rest, sharp with motions Aggravating factors: quick motions, movements pain up to 10/10 Relieving factors: ice, rest, Tylenol  at best 3/10  PRECAUTIONS: Shoulder Protocol in the chart  RED FLAGS: None   WEIGHT BEARING RESTRICTIONS: No  FALLS:  Has patient fallen in last 6 months? Yes. Number of falls 1  LIVING ENVIRONMENT: Lives with: lives alone Lives in: House/apartment Stairs: No Has following equipment at home: None  OCCUPATION: retired  PLOF: Independent  PATIENT GOALS:dress without difficulty, do hair, have good ROM and less pain  NEXT MD VISIT:   OBJECTIVE:   DIAGNOSTIC FINDINGS:  See above  PATIENT SURVEYS:  FOTO 21  COGNITION: Overall cognitive status: Within functional limits for tasks assessed     SENSATION: WFL  POSTURE: Fwd head, rounded shoulders, elevated and guarded shoulder  UPPER EXTREMITY ROM:   Active ROM Right PROM eval Left AROM  08/31/23 PROM Supine 01/05/23 PROM  01/26/23 PROM 02/09/23 AROM sitting 02/14/23 AROM 02/21/23 AROM  03/02/23 AROM  03/22/23 AAROM 03/28/23 AROM Standing 04/13/23 AROM Standing 05/02/23 AROM  Standing 05/11/23 AROM  Standing 05/25/23 AROM Standing 06/01/23 AROM Standing  07/06/23 AROM  Standing 07/27/23 AROM Standing 08/31/23 AROM Standing  10/03/23 AROM 10/19/23  Shoulder flexion 35 90 118  123 90 96 95 100 111 110 112 120 125  129 132 130 105 115 110  Shoulder extension 5                     Shoulder abduction 20 95 90 92  73 80 83 90 93 90 92 98 100 102  105 95 81 90   Shoulder adduction                      Shoulder internal rotation 15 35            40 40  42 25    Shoulder external rotation 20 65 45 30  41  45 50 53 56 56 60 66 67 70 70  47 60   Elbow flexion 120                     Elbow extension 5                     Wrist flexion                      Wrist extension                      Wrist ulnar deviation  Wrist radial deviation                       Wrist pronation                      Wrist supination                      (Blank rows = not tested)  UPPER EXTREMITY MMT:  No tested due to recent surgery  MMT Right eval Left eval  Shoulder flexion    Shoulder extension    Shoulder abduction    Shoulder adduction    Shoulder internal rotation    Shoulder external rotation    Middle trapezius    Lower trapezius    Elbow flexion    Elbow extension    Wrist flexion    Wrist extension    Wrist ulnar deviation    Wrist radial deviation    Wrist pronation    Wrist supination    Grip strength (lbs)    (Blank rows = not tested)  PALPATION:  Very tight and tender in the pectoral, upper trap, the entire right upper arm   TODAY'S TREATMENT:                                                                                                                                         DATE:  10/25/23 UBE level 2 x 4 minutes 10# row 2x10 5# biceps  15# triceps Red tband IR Yellow tband ER Supine 2# punches with PT limiting the ROM Supine 2# serratus 2# isometric circles 1# small ROM ER/IR supine Gentle PROM avoiding pain Vaso 34 degrees low pressure  10/19/23 Discussion of the plan the surgeon gave her and the plan that I feel we will do, we measured the ROM and looked at anatomy and what the MD notes say as she had a lot of questions. See plan below Some STM to the right upper trap  10/17/23 Gentle stretch of the neck and shoulders STM to the bilateral upper traps, rhomboids, teres and right pectoral mms Vaso to the right shoulder 35 degrees medium pressure  10/12/23 UBE level 4 x 6 minutes Passive stretch to bilateral shoulders STM to the left shoulder/upper arm STM to the right upper trap, rhomboid, neck and teres. Vaso medium pressure 34 degrees  10/10/23 UBE level 3 x 6 minutes PROM right shoulder to her tolerance all motions STM to the right upper trap, neck and rhomboid Vaso right shoulder medium  pressure 34 degrees  10/05/23 UBE level 4 x 5 minutes Supine chest presses Supine flexion with 2# wate bar Passive stretch gentle right shoulder STM to the right upper trap and neck Vaso medium pressure  10/03/23 UBE level 3 x 6 minutes Supine wand chest press Supine wand flexion Passive stretch right shoulder  STM to the  right upper arm and pectoral area Vaso 34 degrees right shoulder ROM measured as above PATIENT EDUCATION: Education details: poc/hep Person educated: Patient Education method: Programmer, multimedia, Facilities manager, Actor cues, Verbal cues, and Handouts Education comprehension: verbalized understanding  HOME EXERCISE PROGRAM: Access Code: [email or web address] URL: [email or web address] Date: 12/20/2022 Prepared by: Ozell Mainland  Exercises - Seated Shoulder Flexion Towel Slide at Table Top  - 2 x daily - 7 x weekly - 2 sets - 10 reps - 3 hold - Seated Shoulder External Rotation PROM on Table  - 2 x daily - 7 x weekly - 2 sets - 10 reps - 3 hold - Seated Elbow Extension and Shoulder External Rotation AAROM at Table with Towel  - 2 x daily - 7 x weekly - 2 sets - 10 reps - 3 hold - Circular Shoulder Pendulum with Table Support  - 2 x daily - 7 x weekly - 2 sets - 10 reps - 3 hold  Access Code: [email or web address] URL: [email or web address] Date: 07/13/2023 Prepared by: Ozell Mainland  Exercises - Isometric Shoulder Flexion at Wall  - 1 x daily - 7 x weekly - 1 sets - 10 reps - 3 hold - Standing Isometric Shoulder Internal Rotation at Doorway  - 1 x daily - 7 x weekly - 1 sets - 10 reps - 3 hold - Isometric Shoulder Extension at Wall  - 1 x daily - 7 x weekly - 1 sets - 10 reps - 3 hold - Isometric Shoulder Abduction at Wall  - 1 x daily - 7 x weekly - 1 sets - 10 reps - 3 hold - Standing Isometric Shoulder External Rotation with Doorway  - 1 x daily - 7 x weekly - 1 sets - 10 reps - 3 hold  ASSESSMENT:  CLINICAL IMPRESSION:  So today we started back with  very gentle strengthening and minimal to light stretching, we will try this for about a month per MD and see what happens, again go slow and add weight minimally. Surgeon note suggest one more month of PT and then stop  Patient saw surgeon, the bone scan said did not feel that there was infection or loosening however the surgeon is not so sure, he wants to do an US  to be sure, that is scheduled for the next few weeks, since she has not been to PT in a month I re-measured her ROM, she has lost at least 15 degrees of motion and more for all motions.  She is also having more pain in the left shoulder possibly from overdoing it, because she is trying to not use the right shoulder.  She has talked about this with her primary and I suggested that she see a sports medicine MD, she called while she was here and will have appointment with him next week.  As far as the right shoulder the Surgeon stated in his notes to resume formal PT on a conservative basis to help with ROM and pain until the US  is performed   OBJECTIVE IMPAIRMENTS: cardiopulmonary status limiting activity, decreased activity tolerance, decreased endurance, decreased ROM, decreased strength, increased edema, increased muscle spasms, impaired flexibility, impaired UE functional use, improper body mechanics, postural dysfunction, and pain.  REHAB POTENTIAL: Good  CLINICAL DECISION MAKING: Evolving/moderate complexity  EVALUATION COMPLEXITY: Low   GOALS: Goals reviewed with patient? Yes  SHORT TERM GOALS: Target date: 01/01/23  Independent with initial HEP Goal status: 12/27/22 MET  LONG TERM GOALS: Target date: 03/22/23  Decrease pain 50% Goal status: progressing  10/03/23  2.  Dress without difficulty Goal status: progressing 10/03/23  3.  Do hair without difficulty Goal status: able to reach the top of head at times progressing 2/20/253/25/25 4.  Increase AROM right shoulder flexion to 130 degrees Goal status: 07/25/23 progressing 130  degrees with pain, since March has regressed but is better since march6/3/25  5.  Increase right shoulder ER to 60 degrees Goal status: met 07/06/23  6.  Return to water  aerobics and or gym activity Goal status:progressing met 06/01/23  PLAN:  PT FREQUENCY: 1-2x/week  PT DURATION: 12 weeks  PLANNED INTERVENTIONS: Therapeutic exercises, Therapeutic activity, Neuromuscular re-education, Balance training, Gait training, Patient/Family education, Self Care, Joint mobilization, Dry Needling, Electrical stimulation, Cryotherapy, Vasopneumatic device, and Manual therapy  PLAN FOR NEXT SESSION:   See patient over the next month, will look to D/C at the end of July, and then have her take time off and see how she does 10/25/2023, 11:03 AM [facility] [address] [facility] [place], [place], [address] Phone: [phone]   Fax:  [phone]

## 2023-10-26 ENCOUNTER — Ambulatory Visit: Admitting: Physical Therapy

## 2023-10-26 ENCOUNTER — Encounter: Payer: Self-pay | Admitting: Physical Therapy

## 2023-10-26 DIAGNOSIS — G8929 Other chronic pain: Secondary | ICD-10-CM

## 2023-10-26 DIAGNOSIS — M25511 Pain in right shoulder: Secondary | ICD-10-CM | POA: Diagnosis not present

## 2023-10-26 DIAGNOSIS — M25641 Stiffness of right hand, not elsewhere classified: Secondary | ICD-10-CM | POA: Diagnosis not present

## 2023-10-26 DIAGNOSIS — M79641 Pain in right hand: Secondary | ICD-10-CM | POA: Diagnosis not present

## 2023-10-26 DIAGNOSIS — M25611 Stiffness of right shoulder, not elsewhere classified: Secondary | ICD-10-CM

## 2023-10-26 DIAGNOSIS — R252 Cramp and spasm: Secondary | ICD-10-CM

## 2023-10-26 DIAGNOSIS — R278 Other lack of coordination: Secondary | ICD-10-CM | POA: Diagnosis not present

## 2023-10-26 DIAGNOSIS — M542 Cervicalgia: Secondary | ICD-10-CM | POA: Diagnosis not present

## 2023-10-26 DIAGNOSIS — M6281 Muscle weakness (generalized): Secondary | ICD-10-CM

## 2023-10-26 DIAGNOSIS — M79642 Pain in left hand: Secondary | ICD-10-CM | POA: Diagnosis not present

## 2023-10-26 DIAGNOSIS — M25642 Stiffness of left hand, not elsewhere classified: Secondary | ICD-10-CM | POA: Diagnosis not present

## 2023-10-26 NOTE — Therapy (Signed)
 OUTPATIENT PHYSICAL THERAPY SHOULDER    Patient Name: Veronica Galvan MRN: [id number] DOB:[birth year], 68 y.o., female Today's Date: 10/26/2023  END OF SESSION:  PT End of Session - 10/26/23 0926     Visit Number 77    Date for PT Re-Evaluation 11/02/23    Authorization Type [employer] [place]    PT Start Time [phone]    PT Stop Time 1024    PT Time Calculation (min) 58 min    Activity Tolerance Patient tolerated treatment well    Behavior During Therapy WFL for tasks assessed/performed          Past Medical History:  Diagnosis Date   Allergy     Anxiety    Arthritis    hands   Cataract    Diabetes mellitus without complication (HCC)    type 2   Family history of adverse reaction to anesthesia    son has malignant hyperthermia, daughter does not daughter recently had c section without problems   GERD (gastroesophageal reflux disease)    Headache    sinus   Hyperlipidemia    Hypertension    PONV (postoperative nausea and vomiting)    nausea only   Ulcer, stomach peptic yrs ago   Past Surgical History:  Procedure Laterality Date   APPENDECTOMY     both hells bone spur repair     both heels with metal clips   both shoulder rotator cuff repair     CESAREAN SECTION     x 1   COLONOSCOPY WITH PROPOFOL  N/A 09/07/2016   Procedure: COLONOSCOPY WITH PROPOFOL ;  Surgeon: Vicci Gladis POUR, MD;  Location: [facility];  Service: Endoscopy;  Laterality: N/A;   colonscopy  06/2011   polyps   ELBOW FRACTURE SURGERY Left    EYE SURGERY     FRACTURE SURGERY     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/10/2022   Procedure: REVERSE SHOULDER ARTHROPLASTY;  Surgeon: Melita Drivers, MD;  Location: [facility];  Service: Orthopedics;  Laterality: Right;  Please follow in room 6 if able   TUBAL LIGATION     VESICO-VAGINAL FISTULA REPAIR     Patient Active Problem List   Diagnosis Date Noted   Gastroesophageal reflux disease 11/16/2021   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 08/18/2021   Dermatofibroma 08/18/2021   History of malignant neoplasm of skin 08/18/2021   Lentigo 08/18/2021   Melanocytic nevi of trunk 08/18/2021   Nevus lipomatosus cutaneous superficialis 08/18/2021   Rosacea 08/18/2021   Actinic keratosis 08/18/2021   Sensorineural hearing loss (SNHL) of left ear with unrestricted hearing of right ear 07/26/2021   Tinnitus of left ear 07/26/2021   Acute recurrent maxillary sinusitis 06/22/2021   Primary hypertension 01/12/2021   Hyperlipidemia associated with type 2 diabetes mellitus (HCC) 01/12/2021   Arthritis 01/12/2021   Type II diabetes mellitus (HCC) 11/25/2019   Ingrown toenail 09/05/2018   Neck pain 01/29/2018   Tick bite 10/24/2017    PCP: [name], [name]  REFERRING PROVIDER: Melita, MD  REFERRING DIAG: s/p right reverse TSA  THERAPY DIAG:  Muscle weakness (generalized)  Chronic right shoulder pain  Stiffness of right shoulder, not elsewhere classified  Cervicalgia  Spasm  Rationale for Evaluation and Treatment: Rehabilitation  ONSET DATE: 11/05/22  SUBJECTIVE:  SUBJECTIVE STATEMENT:   Patient is doing a vacation bible school this week,she is sore today  Patient reports that she saw the surgeon she is frustrated and upset as she feels that she still has no answers, it is felt that there is no loosening and no infection.  She is still hurting with basic ADL's, her AROM decreased beginning in March, as she started having increased pain in mid January after a very busy and active few month, she has an order for the left shoulder and the okay for us  to see her for her right shoulder for another month PAIN:  Are you having pain? Yes: NPRS scale: 5/10 Pain location: right shoulder, HA Pain description: dull ache at rest, sharp with  motions Aggravating factors: quick motions, movements pain up to 10/10 Relieving factors: ice, rest, Tylenol  at best 3/10  PRECAUTIONS: Shoulder Protocol in the chart  RED FLAGS: None   WEIGHT BEARING RESTRICTIONS: No  FALLS:  Has patient fallen in last 6 months? Yes. Number of falls 1  LIVING ENVIRONMENT: Lives with: lives alone Lives in: House/apartment Stairs: No Has following equipment at home: None  OCCUPATION: retired  PLOF: Independent  PATIENT GOALS:dress without difficulty, do hair, have good ROM and less pain  NEXT MD VISIT:   OBJECTIVE:   DIAGNOSTIC FINDINGS:  See above  PATIENT SURVEYS:  FOTO 21  COGNITION: Overall cognitive status: Within functional limits for tasks assessed     SENSATION: WFL  POSTURE: Fwd head, rounded shoulders, elevated and guarded shoulder  UPPER EXTREMITY ROM:   Active ROM Right PROM eval Left AROM  08/31/23 PROM Supine 01/05/23 PROM  01/26/23 PROM 02/09/23 AROM sitting 02/14/23 AROM 02/21/23 AROM  03/02/23 AROM  03/22/23 AAROM 03/28/23 AROM Standing 04/13/23 AROM Standing 05/02/23 AROM  Standing 05/11/23 AROM  Standing 05/25/23 AROM Standing 06/01/23 AROM Standing  07/06/23 AROM  Standing 07/27/23 AROM Standing 08/31/23 AROM Standing  10/03/23 AROM 10/19/23  Shoulder flexion 35 90 118  123 90 96 95 100 111 110 112 120 125  129 132 130 105 115 110  Shoulder extension 5                     Shoulder abduction 20 95 90 92  73 80 83 90 93 90 92 98 100 102  105 95 81 90   Shoulder adduction                      Shoulder internal rotation 15 35            40 40  42 25    Shoulder external rotation 20 65 45 30  41  45 50 53 56 56 60 66 67 70 70  47 60   Elbow flexion 120                     Elbow extension 5                     Wrist flexion                      Wrist extension                      Wrist ulnar deviation                      Wrist radial deviation  Wrist pronation                       Wrist supination                      (Blank rows = not tested)  UPPER EXTREMITY MMT:  No tested due to recent surgery  MMT Right eval Left eval  Shoulder flexion    Shoulder extension    Shoulder abduction    Shoulder adduction    Shoulder internal rotation    Shoulder external rotation    Middle trapezius    Lower trapezius    Elbow flexion    Elbow extension    Wrist flexion    Wrist extension    Wrist ulnar deviation    Wrist radial deviation    Wrist pronation    Wrist supination    Grip strength (lbs)    (Blank rows = not tested)  PALPATION:  Very tight and tender in the pectoral, upper trap, the entire right upper arm   TODAY'S TREATMENT:                                                                                                                                         DATE:  10/26/23 UBE 5 minutes Red tand row and extension Ball roll ups Ball vs wall 2 different positions  20# triceps STM to the left shoulder and neck Some passive stretch to bilateral pecs and biceps Vaso low pressure 34 degrees  10/25/23 UBE level 2 x 4 minutes 10# row 2x10 5# biceps  15# triceps Red tband IR Yellow tband ER Supine 2# punches with PT limiting the ROM Supine 2# serratus 2# isometric circles 1# small ROM ER/IR supine Gentle PROM avoiding pain Vaso 34 degrees low pressure  10/19/23 Discussion of the plan the surgeon gave her and the plan that I feel we will do, we measured the ROM and looked at anatomy and what the MD notes say as she had a lot of questions. See plan below Some STM to the right upper trap  10/17/23 Gentle stretch of the neck and shoulders STM to the bilateral upper traps, rhomboids, teres and right pectoral mms Vaso to the right shoulder 35 degrees medium pressure  10/12/23 UBE level 4 x 6 minutes Passive stretch to bilateral shoulders STM to the left shoulder/upper arm STM to the right upper trap, rhomboid, neck and teres. Vaso medium  pressure 34 degrees  10/10/23 UBE level 3 x 6 minutes PROM right shoulder to her tolerance all motions STM to the right upper trap, neck and rhomboid Vaso right shoulder medium pressure 34 degrees  10/05/23 UBE level 4 x 5 minutes Supine chest presses Supine flexion with 2# wate bar Passive stretch gentle right shoulder STM to the right upper trap and neck Vaso medium pressure  10/03/23 UBE level 3  x 6 minutes Supine wand chest press Supine wand flexion Passive stretch right shoulder  STM to the right upper arm and pectoral area Vaso 34 degrees right shoulder ROM measured as above PATIENT EDUCATION: Education details: poc/hep Person educated: Patient Education method: Programmer, multimedia, Facilities manager, Actor cues, Verbal cues, and Handouts Education comprehension: verbalized understanding  HOME EXERCISE PROGRAM: Access Code: [email or web address] URL: [email or web address] Date: 12/20/2022 Prepared by: Ozell Mainland  Exercises - Seated Shoulder Flexion Towel Slide at Table Top  - 2 x daily - 7 x weekly - 2 sets - 10 reps - 3 hold - Seated Shoulder External Rotation PROM on Table  - 2 x daily - 7 x weekly - 2 sets - 10 reps - 3 hold - Seated Elbow Extension and Shoulder External Rotation AAROM at Table with Towel  - 2 x daily - 7 x weekly - 2 sets - 10 reps - 3 hold - Circular Shoulder Pendulum with Table Support  - 2 x daily - 7 x weekly - 2 sets - 10 reps - 3 hold  Access Code: [email or web address] URL: [email or web address] Date: 07/13/2023 Prepared by: Ozell Mainland  Exercises - Isometric Shoulder Flexion at Wall  - 1 x daily - 7 x weekly - 1 sets - 10 reps - 3 hold - Standing Isometric Shoulder Internal Rotation at Doorway  - 1 x daily - 7 x weekly - 1 sets - 10 reps - 3 hold - Isometric Shoulder Extension at Wall  - 1 x daily - 7 x weekly - 1 sets - 10 reps - 3 hold - Isometric Shoulder Abduction at Wall  - 1 x daily - 7 x weekly - 1 sets - 10 reps - 3 hold -  Standing Isometric Shoulder External Rotation with Doorway  - 1 x daily - 7 x weekly - 1 sets - 10 reps - 3 hold  ASSESSMENT:  CLINICAL IMPRESSION:  Continued with light exercises and trying to not cause any pain, did some stretching and felt like the pecs and biceps were needing a good stretch.  Surgeon note suggest one more month of PT and then stop  Patient saw surgeon, the bone scan said did not feel that there was infection or loosening however the surgeon is not so sure, he wants to do an US  to be sure, that is scheduled for the next few weeks, since she has not been to PT in a month I re-measured her ROM, she has lost at least 15 degrees of motion and more for all motions.  She is also having more pain in the left shoulder possibly from overdoing it, because she is trying to not use the right shoulder.  She has talked about this with her primary and I suggested that she see a sports medicine MD, she called while she was here and will have appointment with him next week.  As far as the right shoulder the Surgeon stated in his notes to resume formal PT on a conservative basis to help with ROM and pain until the US  is performed   OBJECTIVE IMPAIRMENTS: cardiopulmonary status limiting activity, decreased activity tolerance, decreased endurance, decreased ROM, decreased strength, increased edema, increased muscle spasms, impaired flexibility, impaired UE functional use, improper body mechanics, postural dysfunction, and pain.  REHAB POTENTIAL: Good  CLINICAL DECISION MAKING: Evolving/moderate complexity  EVALUATION COMPLEXITY: Low   GOALS: Goals reviewed with patient? Yes  SHORT TERM GOALS: Target date: 01/01/23  Independent with initial HEP Goal status: 12/27/22 MET  LONG TERM GOALS:  Target date: 03/22/23  Decrease pain 50% Goal status: progressing 10/03/23  2.  Dress without difficulty Goal status: progressing  10/26/23  3.  Do hair without difficulty Goal status: able to reach the  top of head at times progressing 2/20/253/25/25 4.  Increase AROM right shoulder flexion to 130 degrees Goal status: 07/25/23 progressing 130 degrees with pain, since March has regressed but is better since march6/3/25  5.  Increase right shoulder ER to 60 degrees Goal status: met 07/06/23  6.  Return to water  aerobics and or gym activity Goal status:progressing met 06/01/23  PLAN:  PT FREQUENCY: 1-2x/week  PT DURATION: 12 weeks  PLANNED INTERVENTIONS: Therapeutic exercises, Therapeutic activity, Neuromuscular re-education, Balance training, Gait training, Patient/Family education, Self Care, Joint mobilization, Dry Needling, Electrical stimulation, Cryotherapy, Vasopneumatic device, and Manual therapy  PLAN FOR NEXT SESSION:   See patient over the next month, will look to D/C at the end of July, and then have her take time off and see how she does 10/26/2023, 9:26 AM [facility] at [facility] [address] [facility]. [place], [place], [address] Phone: [phone]   Fax:  [phone]

## 2023-10-31 ENCOUNTER — Ambulatory Visit: Attending: Family | Admitting: Occupational Therapy

## 2023-10-31 ENCOUNTER — Ambulatory Visit: Admitting: Physical Therapy

## 2023-10-31 ENCOUNTER — Encounter: Payer: Self-pay | Admitting: Physical Therapy

## 2023-10-31 ENCOUNTER — Other Ambulatory Visit: Payer: Self-pay | Admitting: Family

## 2023-10-31 DIAGNOSIS — M79641 Pain in right hand: Secondary | ICD-10-CM | POA: Diagnosis not present

## 2023-10-31 DIAGNOSIS — G8929 Other chronic pain: Secondary | ICD-10-CM

## 2023-10-31 DIAGNOSIS — M6281 Muscle weakness (generalized): Secondary | ICD-10-CM | POA: Diagnosis not present

## 2023-10-31 DIAGNOSIS — R252 Cramp and spasm: Secondary | ICD-10-CM | POA: Insufficient documentation

## 2023-10-31 DIAGNOSIS — M25511 Pain in right shoulder: Secondary | ICD-10-CM | POA: Insufficient documentation

## 2023-10-31 DIAGNOSIS — M79642 Pain in left hand: Secondary | ICD-10-CM | POA: Diagnosis not present

## 2023-10-31 DIAGNOSIS — M25611 Stiffness of right shoulder, not elsewhere classified: Secondary | ICD-10-CM | POA: Insufficient documentation

## 2023-10-31 DIAGNOSIS — M25641 Stiffness of right hand, not elsewhere classified: Secondary | ICD-10-CM | POA: Insufficient documentation

## 2023-10-31 DIAGNOSIS — M25642 Stiffness of left hand, not elsewhere classified: Secondary | ICD-10-CM | POA: Diagnosis not present

## 2023-10-31 DIAGNOSIS — M542 Cervicalgia: Secondary | ICD-10-CM | POA: Insufficient documentation

## 2023-10-31 DIAGNOSIS — R278 Other lack of coordination: Secondary | ICD-10-CM | POA: Diagnosis not present

## 2023-10-31 NOTE — Therapy (Signed)
 OUTPATIENT OCCUPATIONAL THERAPY ORTHO Treatment  Patient Name: Veronica Galvan MRN: [id number] DOB:1955-12-17, 68 y.o., female Today's Date: 10/31/2023  PCP: Leita Eliza Elbe [name] REFERRING PROVIDER: Leita Eliza Elbe [name]  END OF SESSION:  OT End of Session - 10/31/23 1537     Visit Number 29    Number of Visits 41    Date for OT Re-Evaluation 11/07/23    Authorization Type [employer] MCR    Authorization Time Period 12 weeks    Authorization - Visit Number 29    Progress Note Due on Visit 36    OT Start Time 1450    OT Stop Time 1530    OT Time Calculation (min) 40 min    Activity Tolerance Patient tolerated treatment well    Behavior During Therapy WFL for tasks assessed/performed                         Past Medical History:  Diagnosis Date   Allergy     Anxiety    Arthritis    hands   Cataract    Diabetes mellitus without complication (HCC)    type 2   Family history of adverse reaction to anesthesia    son has malignant hyperthermia, daughter does not daughter recently had c section without problems   GERD (gastroesophageal reflux disease)    Headache    sinus   Hyperlipidemia    Hypertension    PONV (postoperative nausea and vomiting)    nausea only   Ulcer, stomach peptic yrs ago   Past Surgical History:  Procedure Laterality Date   APPENDECTOMY     both hells bone spur repair     both heels with metal clips   both shoulder rotator cuff repair     CESAREAN SECTION     x 1   COLONOSCOPY WITH PROPOFOL  N/A 09/07/2016   Procedure: COLONOSCOPY WITH PROPOFOL ;  Surgeon: Vicci Gladis POUR, MD;  Location: [facility];  Service: Endoscopy;  Laterality: N/A;   colonscopy  06/2011   polyps   ELBOW FRACTURE SURGERY Left    EYE SURGERY     FRACTURE SURGERY     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/10/2022   Procedure: REVERSE SHOULDER ARTHROPLASTY;  Surgeon: Melita Drivers, MD;  Location: [facility];  Service:  Orthopedics;  Laterality: Right;  Please follow in room 6 if able   TUBAL LIGATION     VESICO-VAGINAL FISTULA REPAIR     Patient Active Problem List   Diagnosis Date Noted   Gastroesophageal reflux disease 11/16/2021   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 08/18/2021   Dermatofibroma 08/18/2021   History of malignant neoplasm of skin 08/18/2021   Lentigo 08/18/2021   Melanocytic nevi of trunk 08/18/2021   Nevus lipomatosus cutaneous superficialis 08/18/2021   Rosacea 08/18/2021   Actinic keratosis 08/18/2021   Sensorineural hearing loss (SNHL) of left ear with unrestricted hearing of right ear 07/26/2021   Tinnitus of left ear 07/26/2021   Acute recurrent maxillary sinusitis 06/22/2021   Primary hypertension 01/12/2021   Hyperlipidemia associated with type 2 diabetes mellitus (HCC) 01/12/2021   Arthritis 01/12/2021   Type II diabetes mellitus (HCC) 11/25/2019   Ingrown toenail 09/05/2018   Neck pain 01/29/2018   Tick bite 10/24/2017    ONSET DATE: 05/19/23  REFERRING DIAG:  Diagnosis  M79.641,M79.642 (ICD-10-CM) - Bilateral hand  pain    THERAPY DIAG:  Muscle weakness (generalized)  Stiffness of left hand, not elsewhere classified  Stiffness of right hand, not elsewhere classified  Pain in right hand  Pain in left hand  Other lack of coordination  Rationale for Evaluation and Treatment: Rehabilitation  SUBJECTIVE:   SUBJECTIVE STATEMENT: Pt reports  her hands felt looser after last OT session Pt accompanied by: self  PERTINENT HISTORY: 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 Yes: NPRS scale: 4/10 Pain location: right hand,  left hand Pain description: aching, stiff Aggravating factors: gripping   Relieving factors: heat, meds Pt also has shoulder pain which is more significant grossly R shoulder 7/10 L shoulder pain 3/10 which OT will not address. PT is addressing shoulder FALLS: Has patient fallen in last 6 months? No  LIVING ENVIRONMENT: Lives with: lives alone Lives in: House/apartment Stairs: yes   PLOF: Independent  PATIENT GOALS: improve functional use of hands and learn adapted strategies for ADLs/IADLs.  NEXT MD VISIT: unknown  OBJECTIVE:  Note: Objective measures were completed at Evaluation unless otherwise noted.  HAND DOMINANCE: Right  ADLs: Overall ADLs: unable to grip credit card to remove from ATM Transfers/ambulation related to ADLs: mod I Eating: drops silverware Grooming: drops toothbrush, uses electric toothbrush, holding comb Upper body dressing: adjusting bra Lower body dressing: difficulty pulling up pants  Toileting: hygeine was difficult initally Bathing: mod I, difficult with shaving Tub shower transfers: walk in shower    FUNCTIONAL OUTCOME MEASURES: Quick Dash: 05/30/23- 75%% disability Quick Dash 08/15/23- 70.5% disability   UPPER EXTREMITY ROM:   RUE wrist flexion/ extension: 70/ 50, LUE wrist flexion/ extension: 70/ 45 Pt demonstrates grossly 50% composite flexion for right and 555 with left.   Active ROM Right eval Left eval  Thumb MCP (0-60) 45 55  Thumb IP (0-80) 25 10  Thumb Radial abd/add (0-55)     Thumb Palmar abd/add (0-45)     Thumb Opposition to Small Finger yes yes   Index MCP (0-90)     Index PIP (0-100)     Index DIP (0-70)      Long MCP (0-90)      Long PIP (0-100)      Long DIP (0-70)      Ring MCP (0-90)      Ring PIP (0-100)      Ring DIP (0-70)      Little MCP (0-90)      Little PIP (0-100)      Little DIP (0-70)      (Blank rows = not tested) 9 hole peg test (08/15/23) RUE 26.70 secs, LUE 23 secs  HAND FUNCTION: Grip strength: Right: 8 lbs; Left: 4 lbs,          08/15/23-  RUE 18, LUE 14 lbs,            08/15/23   Pinch: RUE tip 4lbs, lateral 8 lbs, LUE tip 3 lbs, lateral 8 lbs   SENSATION: Light touch: Impaired index finger LUE  EDEMA: Pt with bony defomities at PIP joints of all digits which pt reports are bone spurs, Pt also has bony deformity at DIP for right thumb, index and 5th digit and LUE small and index fingers  COGNITION: Overall cognitive status: Within  functional limits for tasks assessed   OBSERVATIONS: Pleasant female desires to increase functional use of bilateral UE's  , TREATMENT DATE:10/24/23-Paraffin to RUE x 10 mins, no adverse reactions, while therapist performed US  to left hand 3.3 mhz, 0.8 w/cm 2,  20%x 8 mins no adverse reactions for stiffness. Paraffin to LUE x 10 mins, no adverse reactions, while therapist performed US  to right hand 3.3 mhz, 0.8 w/cm 2,  20%x 8 mins no adverse reactions for stiffness.  Gentle soft tissue and joint mobs/ distraction to digits, hand, wrist bilaterally as welll as right forearm,  Passive finger flexion to bilateral hands. Therpist checked 9 hole peg test, see goals. 10/17/23-Paraffin to RUE x 10 mins, no adverse reactions, while therapist performed US  to left hand 3.3 mhz, 0.8 w/cm 2,  20%x 8 mins no adverse reactions for stiffness. Paraffin to LUE x 10 mins, no adverse reactions, while therapist performed US  to right hand 3.3 mhz, 0.8 w/cm 2,  20%x 8 mins no adverse reactions for stiffness.  Gentle soft tissue and joint mobs/ distraction to digits, hand, wrist bilaterally as welll as right forearm, pt reports  hands and wrists are feeling looser at end of session. Passive composite finger flexion to right hand and then individual finger flexion to left hand . Therapist checked grip strength:  RUE 23 lbs LUE 25 lbs, pt demonstrates bilateral improvements and she reports continuing improving functional use.   10/03/23 Paraffin to RUE x 10 mins, no adverse reactions, while therapist performed US  to left hand 3.3 mhz, 0.8 w/cm 2,   20%x 8 mins no adverse reactions for stiffness. Paraffin to LUE x 10 mins, no adverse reactions, while therapist performed US  to right hand 3.3 mhz, 0.8 w/cm 2,  20%x 8 mins no adverse reactions for stiffness.  Passive composite finger flexion to right hand and then individual finger flexion to left hand . Gentle soft tissue and joint mobs/ distraction to digits, hand, wrist bilaterally as welll as right foream, pt reports feeling looser Resistive gripping with  tree squeeze ball left and right UE's digi flex, light resistance for composite and individual finger flexion for LUE   PATIENT EDUCATION: Education details:   see above Person educated: Patient Education method: Explanation, demonstration, v.c Education comprehension: verbalized understanding, returned demonstration  HOME EXERCISE PROGRAM: AROM yellow putty  GOALS: Goals reviewed with patient? Yes  SHORT TERM GOALS: Target date: 09/14/23  I with HEP  Goal status:  met 06/07/23  2.  I with positioning/ splinting prn to minimize pain and defomity   Goal status: met, 07/13/23  3.  Pt will increase bilateral grip strength by 5 lbs for increased UE functional use. Upgraded goal Pt will increase bilateral grip strength to at least 25 lbs (after stretching). Baseline: 05/30/23- RUE 8 lbs, LUE 4 lbs Goal status:met 08/01/23-R 20 lbs, L  19 lbs,  08/15/23- RUE 18 lbsLUE 14 lbs continue to address upgraded goal  - RUE 19 lbs, LUE 24- 10/03/23  RUE 23 lbs LUE 25 lbs, met for LUE, ongoing for right. 10/17/23  4. Pt will demonstrate improved RUE fine motor coordination as evidenced by perfroming 9 hole peg test in 23 secs or less.  Goal status: ongoing 10/17/23, ongoing 26.68- 10/24/23 5. I with updates to HEP.  Goal status: ongoing 10/17/23 6. Pt will demonstrate improved composite finger flexion for ADLs as evidenced by pt. bringing middle fingertip for RUE within 1/2 an inch of palm.  Baseline: 3/4 inch from palm to middle  fingertip  Goal status : ongoing, 3/4 inch 09/19/23, 1 inch from palm 10/24/23 7.Pt will demonstrate improved composite finger flexion for ADLs as evidenced by pt. bringing middle fingertip for LUE within 3/4 an inch of palm.  Baseline: 1 inch from palm to middle finger tip  Goal status: met, 3/4 inch away from middle finger 09/19/23  LONG TERM GOALS: Target date:11/07/23  I with updated HEP  Goal status:met 08/15/22  2.  Pt will improve Quick Dash score to 70% disability Baseline: 75% (05/30/23) Goal status:   improved - however not fully met due to complications from shoulder pain-70.5%08/15/23  3.  Pt will demonstrate at least 65% composte flexion for LUE for increased functional use.  Goal status: met 65-70% , 08/15/23  4.  I with adapted strategies/ adapted equipment to minimize pain and to increase pt I with ADLs/IADLs  Goal status:  met for inital strategies, however continue goal as pt may report additional tasks that can benefit from modification.ongoing 10/03/23  5.  Pt will demonstrate at least 65% composite flexion for RUE for increased functional use.  Goal status: met 70% 08/15/23  6. Pt will report that she is able to cut her meat such as steak or chicken modified independently.  [place]: unable  Goal status: met 09/21/23    7. Pt will report increased ease with opening/ closing ziplock bags.    Goal status: met 09/21/23    8. Pt will improve bilateral tip pinch by 2 lbs for increased ease with daily activities.     Goal status: ongoing 09/19/23    9. Pt will improve bilateral lateral pinch by 2 lbs for increased ease with ADLs.    Goal status: ongoing 09/19/23   ASSESSMENT: CLINICAL IMPRESSION: Pt reports improved ability to use hands functionally for meal prep during VBS. Today hand pain is 4/10 which is improved overall. PERFORMANCE DEFICITS: in functional skills including ADLs, IADLs, coordination, sensation, edema, ROM, strength, pain, flexibility, Fine motor control,  Gross motor control, endurance, decreased knowledge of precautions, decreased knowledge of use of DME, and UE functional use,, and psychosocial skills including coping strategies, environmental adaptation, habits, interpersonal interactions, and routines and behaviors.   IMPAIRMENTS: are limiting patient from ADLs, IADLs, rest and sleep, education, play, leisure, and social participation.   COMORBIDITIES: may have co-morbidities  that affects occupational performance. Patient will benefit from skilled OT to address above impairments and improve overall function.  MODIFICATION OR ASSISTANCE TO COMPLETE EVALUATION: No modification of tasks or assist necessary to complete an evaluation.  OT OCCUPATIONAL PROFILE AND HISTORY: Detailed assessment: Review of records and additional review of physical, cognitive, psychosocial history related to current functional performance.  CLINICAL DECISION MAKING: LOW - limited treatment options, no task modification necessary  REHAB POTENTIAL: Good  EVALUATION COMPLEXITY: Low      PLAN:  OT FREQUENCY: 2x/week  OT DURATION: 12 weeks( anticipate d/c after 8 weeks dependent on progress)  PLANNED INTERVENTIONS: 02831 OT Re-evaluation, 97535 self care/ADL training, 02889 therapeutic exercise, 97530 therapeutic activity, 97112 neuromuscular re-education, 97140 manual therapy, 97113 aquatic therapy, 97035 ultrasound, 97018 paraffin, 02960 fluidotherapy, 97010 moist heat, 97010 cryotherapy, 97034 contrast bath, 97760 Orthotics management and training, 02239 Splinting (initial encounter), [id number] Subsequent splinting/medication, passive range of motion, energy conservation, coping strategies training, patient/family education, and DME and/or AE instructions  RECOMMENDED OTHER SERVICES: none  CONSULTED AND AGREED WITH PLAN OF CARE: Patient  PLAN FOR NEXT SESSION:   functional use and ROM for bilateral hands  Karanvir Balderston, OT 10/31/2023, 3:41  PM                     OUTPATIENT OCCUPATIONAL THERAPY ORTHO Treatment  Patient Name: Veronica Galvan MRN: [id number] DOB:[birth year], 68 y.o., female Today's Date: 10/31/2023  PCP: Leita Eliza Elbe [name] REFERRING PROVIDER: Leita Eliza Elbe [name]  END OF SESSION:  OT End of Session - 10/31/23 1537     Visit Number 29    Number of Visits 41    Date for OT Re-Evaluation 11/07/23    Authorization Type [employer] MCR    Authorization Time Period 12 weeks    Authorization - Visit Number 29    Progress Note Due on Visit 36    OT Start Time 1450    OT Stop Time 1530    OT Time Calculation (min) 40 min    Activity Tolerance Patient tolerated treatment well    Behavior During Therapy WFL for tasks assessed/performed                         Past Medical History:  Diagnosis Date   Allergy     Anxiety    Arthritis    hands   Cataract    Diabetes mellitus without complication (HCC)    type 2   Family history of adverse reaction to anesthesia    son has malignant hyperthermia, daughter does not daughter recently had c section without problems   GERD (gastroesophageal reflux disease)    Headache    sinus   Hyperlipidemia    Hypertension    PONV (postoperative nausea and vomiting)    nausea only   Ulcer, stomach peptic yrs ago   Past Surgical History:  Procedure Laterality Date   APPENDECTOMY     both hells bone spur repair     both heels with metal clips   both shoulder rotator cuff repair     CESAREAN SECTION     x 1   COLONOSCOPY WITH PROPOFOL  N/A 09/07/2016   Procedure: COLONOSCOPY WITH PROPOFOL ;  Surgeon: Vicci Gladis POUR, MD;  Location: [facility];  Service: Endoscopy;  Laterality: N/A;   colonscopy  06/2011   polyps   ELBOW FRACTURE SURGERY Left    EYE SURGERY     FRACTURE SURGERY     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/10/2022   Procedure: REVERSE SHOULDER ARTHROPLASTY;  Surgeon: Melita Drivers, MD;  Location: [facility];   Service: Orthopedics;  Laterality: Right;  Please follow in room 6 if able   TUBAL LIGATION     VESICO-VAGINAL FISTULA REPAIR     Patient Active Problem List   Diagnosis Date Noted   Gastroesophageal reflux disease 11/16/2021   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 08/18/2021   Dermatofibroma 08/18/2021   History of malignant neoplasm of skin 08/18/2021   Lentigo 08/18/2021   Melanocytic nevi of trunk 08/18/2021   Nevus lipomatosus cutaneous superficialis 08/18/2021   Rosacea 08/18/2021   Actinic keratosis 08/18/2021   Sensorineural hearing loss (SNHL) of left ear with unrestricted hearing of right ear 07/26/2021   Tinnitus of left ear 07/26/2021   Acute recurrent maxillary sinusitis 06/22/2021   Primary hypertension 01/12/2021   Hyperlipidemia associated with type 2 diabetes mellitus (HCC) 01/12/2021   Arthritis 01/12/2021   Type II diabetes mellitus (HCC) 11/25/2019   Ingrown toenail 09/05/2018   Neck pain 01/29/2018   Tick bite 10/24/2017    ONSET DATE: 05/19/23  REFERRING DIAG:  Diagnosis  M79.641,M79.642 (ICD-10-CM) - Bilateral hand pain    THERAPY DIAG:  Muscle weakness (generalized)  Stiffness of left hand, not elsewhere classified  Stiffness of right hand, not elsewhere classified  Pain in right hand  Pain in left hand  Other lack of coordination  Rationale for Evaluation and Treatment: Rehabilitation  SUBJECTIVE:   SUBJECTIVE STATEMENT: Pt reports  her hands felt looser after last OT session Pt accompanied by: self  PERTINENT HISTORY: 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 Yes: NPRS scale: 6/10- right hand Pain location: right hand,  5/10 left hand Pain description: aching,  stiff Aggravating factors: gripping  Relieving factors: heat, meds Pt also has shoulder pain which is more significant grossly R shoulder 8/10 L shoulder pain 3/10 which OT will not address. PT is addressing shoulder FALLS: Has patient fallen in last 6 months? No  LIVING ENVIRONMENT: Lives with: lives alone Lives in: House/apartment Stairs: yes   PLOF: Independent  PATIENT GOALS: improve functional use of hands and learn adapted strategies for ADLs/IADLs.  NEXT MD VISIT: unknown  OBJECTIVE:  Note: Objective measures were completed at Evaluation unless otherwise noted.  HAND DOMINANCE: Right  ADLs: Overall ADLs: unable to grip credit card to remove from ATM Transfers/ambulation related to ADLs: mod I Eating: drops silverware Grooming: drops toothbrush, uses electric toothbrush, holding comb Upper body dressing: adjusting bra Lower body dressing: difficulty pulling up pants  Toileting: hygeine was difficult initally Bathing: mod I, difficult with shaving Tub shower transfers: walk in shower    FUNCTIONAL OUTCOME MEASURES: Quick Dash: 05/30/23- 75%% disability Quick Dash 08/15/23- 70.5% disability   UPPER EXTREMITY ROM:   RUE wrist flexion/ extension: 70/ 50, LUE wrist flexion/ extension: 70/ 45 Pt demonstrates grossly 50% composite flexion for right and 555 with left.   Active ROM Right eval Left eval  Thumb MCP (0-60) 45 55  Thumb IP (0-80) 25 10  Thumb Radial abd/add (0-55)     Thumb Palmar abd/add (0-45)     Thumb Opposition to Small Finger yes yes   Index MCP (0-90)     Index PIP (0-100)     Index DIP (0-70)      Long MCP (0-90)      Long PIP (0-100)      Long DIP (0-70)      Ring MCP (0-90)      Ring PIP (0-100)      Ring DIP (0-70)      Little MCP (0-90)      Little PIP (0-100)      Little DIP (0-70)      (Blank rows = not tested) 9 hole peg test (08/15/23) RUE 26.70 secs, LUE 23 secs  HAND FUNCTION: Grip strength: Right: 8 lbs; Left: 4 lbs,           08/15/23-  RUE 18, LUE 14 lbs,           08/15/23   Pinch: RUE tip 4lbs, lateral 8 lbs, LUE tip 3 lbs, lateral 8 lbs   SENSATION: Light touch: Impaired index finger LUE  EDEMA: Pt with bony defomities at PIP joints of all digits which pt reports are bone spurs, Pt also has bony deformity at DIP for right thumb, index and 5th digit and LUE  small and index fingers  COGNITION: Overall cognitive status: Within functional limits for tasks assessed   OBSERVATIONS: Pleasant female desires to increase functional use of bilateral UE's  , TREATMENT DATE:10/31/23-Paraffin to RUE x 8 mins, no adverse reactions, while therapist performed US  to left hand 3.3 mhz, 0.8 w/cm 2,  20%x 8 mins no adverse reactions for stiffness. Paraffin to LUE x 8 mins, no adverse reactions, while therapist performed US  to right hand 3.3 mhz, 0.8 w/cm 2,  20%x 8 mins no adverse reactions for stiffness.  Pt reports increased pain and stiffness after using her hands alot to prepare meals for VBS last week. Pt was encouraged to rest her hands and use paraffin at home if possible. Therapist recommends avoiding use of putty for several days o allow pain to resolve. Discussed plans to renew next visit and to continue for another month. Gentle soft tissue and joint mobs/ distraction to digits, hand, wrist forearms bilaterally  Passive composite finger flexion to right hand and then individual finger flexion to left hand .  10/24/23-Paraffin to RUE x 10 mins, no adverse reactions, while therapist performed US  to left hand 3.3 mhz, 0.8 w/cm 2,  20%x 8 mins no adverse reactions for stiffness. Paraffin to LUE x 10 mins, no adverse reactions, while therapist performed US  to right hand 3.3 mhz, 0.8 w/cm 2,  20%x 8 mins no adverse reactions for stiffness.  Gentle soft tissue and joint mobs/ distraction to digits, hand, wrist bilaterally as welll as right forearm, pt reports  hands and wrists are feeling looser at end of session. Passive  composite finger flexion to right hand and then individual finger flexion to left hand . Therapist checked grip strength:  RUE 23 lbs LUE 25 lbs, pt demonstrates bilateral improvements and she reports continuing improving functional use.   10/03/23 Paraffin to RUE x 10 mins, no adverse reactions, while therapist performed US  to left hand 3.3 mhz, 0.8 w/cm 2,  20%x 8 mins no adverse reactions for stiffness. Paraffin to LUE x 10 mins, no adverse reactions, while therapist performed US  to right hand 3.3 mhz, 0.8 w/cm 2,  20%x 8 mins no adverse reactions for stiffness.  Passive composite finger flexion to right hand and then individual finger flexion to left hand . Gentle soft tissue and joint mobs/ distraction to digits, hand, wrist bilaterally as welll as right foream, pt reports feeling looser Resistive gripping with  tree squeeze ball left and right UE's digi flex, light resistance for composite and individual finger flexion for LUE   PATIENT EDUCATION: Education details:   see above Person educated: Patient Education method: Explanation, demonstration, v.c Education comprehension: verbalized understanding, returned demonstration  HOME EXERCISE PROGRAM: AROM yellow putty  GOALS: Goals reviewed with patient? Yes  SHORT TERM GOALS: Target date: 09/14/23  I with HEP  Goal status:  met 06/07/23  2.  I with positioning/ splinting prn to minimize pain and defomity   Goal status: met, 07/13/23  3.  Pt will increase bilateral grip strength by 5 lbs for increased UE functional use. Upgraded goal Pt will increase bilateral grip strength to at least 25 lbs (after stretching). Baseline: 05/30/23- RUE 8 lbs, LUE 4 lbs Goal status:met 08/01/23-R 20 lbs, L  19 lbs,  08/15/23- RUE 18 lbsLUE 14 lbs continue to address upgraded goal  - RUE 19 lbs, LUE 24- 10/03/23  RUE 23 lbs LUE 25 lbs, met for LUE, ongoing for right. 10/17/23  4. Pt will demonstrate improved RUE fine  motor coordination as evidenced by  perfroming 9 hole peg test in 23 secs or less.  Goal status: ongoing 10/17/23 5. I with updates to HEP.  Goal status: ongoing 10/17/23 6. Pt will demonstrate improved composite finger flexion for ADLs as evidenced by pt. bringing middle fingertip for RUE within 1/2 an inch of palm.  Baseline: 3/4 inch from palm to middle fingertip  Goal status : ongoing, 3/4 inch 09/19/23 7.Pt will demonstrate improved composite finger flexion for ADLs as evidenced by pt. bringing middle fingertip for LUE within 3/4 an inch of palm.  Baseline: 1 inch from palm to middle finger tip  Goal status: met, 3/4 inch away from middle finger 09/19/23  LONG TERM GOALS: Target date:11/07/23  I with updated HEP  Goal status:met 08/15/22  2.  Pt will improve Quick Dash score to 70% disability Baseline: 75% (05/30/23) Goal status:   improved - however not fully met due to complications from shoulder pain-70.5%08/15/23  3.  Pt will demonstrate at least 65% composte flexion for LUE for increased functional use.  Goal status: met 65-70% , 08/15/23  4.  I with adapted strategies/ adapted equipment to minimize pain and to increase pt I with ADLs/IADLs  Goal status:  met for inital strategies, however continue goal as pt may report additional tasks that can benefit from modification.ongoing  10/31/23  5.  Pt will demonstrate at least 65% composite flexion for RUE for increased functional use.  Goal status: met 70% 08/15/23  6. Pt will report that she is able to cut her meat such as steak or chicken modified independently.  [place]: unable  Goal status: met 09/21/23    7. Pt will report increased ease with opening/ closing ziplock bags.    Goal status: met 09/21/23    8. Pt will improve bilateral tip pinch by 2 lbs for increased ease with daily activities.     Goal status: ongoing 10/31/23    9. Pt will improve bilateral lateral pinch by 2 lbs for increased ease with ADLs.    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   IMPAIRMENTS: are limiting patient from ADLs, IADLs, rest and sleep, education, play, leisure, and social participation.   COMORBIDITIES: may have co-morbidities  that affects occupational performance. Patient will benefit from skilled OT to address above impairments and improve overall function.  MODIFICATION OR ASSISTANCE TO COMPLETE EVALUATION: No modification of tasks or assist necessary to complete an evaluation.  OT OCCUPATIONAL PROFILE AND HISTORY: Detailed assessment: Review of records and additional review of physical, cognitive, psychosocial history related to current functional performance.  CLINICAL DECISION MAKING: LOW - limited treatment options, no task modification necessary  REHAB POTENTIAL: Good  EVALUATION COMPLEXITY: Low      PLAN:  OT FREQUENCY: 2x/week  OT DURATION: 12 weeks( anticipate d/c after 8 weeks dependent on progress)  PLANNED INTERVENTIONS: 02831 OT Re-evaluation, 97535 self care/ADL training, 02889 therapeutic exercise, 97530 therapeutic activity, 97112 neuromuscular re-education, 97140 manual therapy, 97113 aquatic therapy, 97035 ultrasound, 97018 paraffin, 02960 fluidotherapy, 97010 moist heat, 97010 cryotherapy, 97034 contrast bath, 97760 Orthotics  management and training, 02239 Splinting (initial encounter), [phone] Subsequent splinting/medication, passive range of motion, energy  conservation, coping strategies training, patient/family education, and DME and/or AE instructions  RECOMMENDED OTHER SERVICES: none  CONSULTED AND AGREED WITH PLAN OF CARE: Patient  PLAN FOR NEXT SESSION:  check  goals, renew, functional use and ROM for bilateral hands   Saw [place]all, OT 10/31/2023, 3:41 PM                     OUTPATIENT OCCUPATIONAL THERAPY ORTHO Treatment  Patient Name: Veronica Galvan MRN: [id number] DOB:[birth year], 68 y.o., female Today's Date: 10/31/2023  PCP: Leita Eliza Elbe [name] REFERRING PROVIDER: Leita Eliza Elbe [name]  END OF SESSION:  OT End of Session - 10/31/23 1537     Visit Number 29    Number of Visits 41    Date for OT Re-Evaluation 11/07/23    Authorization Type [employer] MCR    Authorization Time Period 12 weeks    Authorization - Visit Number 29    Progress Note Due on Visit 36    OT Start Time 1450    OT Stop Time 1530    OT Time Calculation (min) 40 min    Activity Tolerance Patient tolerated treatment well    Behavior During Therapy WFL for tasks assessed/performed                         Past Medical History:  Diagnosis Date   Allergy     Anxiety    Arthritis    hands   Cataract    Diabetes mellitus without complication (HCC)    type 2   Family history of adverse reaction to anesthesia    son has malignant hyperthermia, daughter does not daughter recently had c section without problems   GERD (gastroesophageal reflux disease)    Headache    sinus   Hyperlipidemia    Hypertension    PONV (postoperative nausea and vomiting)    nausea only   Ulcer, stomach peptic yrs ago   Past Surgical History:  Procedure Laterality Date   APPENDECTOMY     both hells bone spur repair     both heels with metal clips   both shoulder rotator cuff repair     CESAREAN SECTION     x 1   COLONOSCOPY WITH PROPOFOL  N/A 09/07/2016   Procedure: COLONOSCOPY WITH PROPOFOL ;  Surgeon: Vicci Gladis POUR, MD;   Location: [facility];  Service: Endoscopy;  Laterality: N/A;   colonscopy  06/2011   polyps   ELBOW FRACTURE SURGERY Left    EYE SURGERY     FRACTURE SURGERY     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/10/2022   Procedure: REVERSE SHOULDER ARTHROPLASTY;  Surgeon: Melita Drivers, MD;  Location: [facility];  Service: Orthopedics;  Laterality: Right;  Please follow in room 6 if able   TUBAL LIGATION     VESICO-VAGINAL FISTULA REPAIR     Patient Active Problem List   Diagnosis Date Noted   Gastroesophageal reflux disease 11/16/2021   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 08/18/2021   Dermatofibroma 08/18/2021   History of malignant neoplasm of skin 08/18/2021   Lentigo 08/18/2021   Melanocytic nevi of trunk 08/18/2021   Nevus lipomatosus cutaneous superficialis 08/18/2021   Rosacea 08/18/2021   Actinic keratosis 08/18/2021   Sensorineural hearing loss (SNHL) of left ear with unrestricted hearing of right ear 07/26/2021   Tinnitus of left ear 07/26/2021  Acute recurrent maxillary sinusitis 06/22/2021   Primary hypertension 01/12/2021   Hyperlipidemia associated with type 2 diabetes mellitus (HCC) 01/12/2021   Arthritis 01/12/2021   Type II diabetes mellitus (HCC) 11/25/2019   Ingrown toenail 09/05/2018   Neck pain 01/29/2018   Tick bite 10/24/2017    ONSET DATE: 05/19/23  REFERRING DIAG:  Diagnosis  M79.641,M79.642 (ICD-10-CM) - Bilateral hand pain    THERAPY DIAG:  Muscle weakness (generalized)  Stiffness of left hand, not elsewhere classified  Stiffness of right hand, not elsewhere classified  Pain in right hand  Pain in left hand  Other lack of coordination  Rationale for Evaluation and Treatment: Rehabilitation  SUBJECTIVE:   SUBJECTIVE STATEMENT: Pt reports  her hands felt looser after last OT session Pt accompanied by: self  PERTINENT HISTORY: 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 Yes: NPRS scale: 4/10 Pain location: right hand,  left hand Pain description: aching, stiff Aggravating factors: gripping  Relieving factors: heat, meds Pt also has shoulder pain which is more significant grossly R shoulder 7/10 L shoulder pain 3/10 which OT will not address. PT is addressing shoulder FALLS: Has patient fallen in last 6 months? No  LIVING ENVIRONMENT: Lives with: lives alone Lives in: House/apartment Stairs: yes   PLOF: Independent  PATIENT GOALS: improve functional use of hands and learn adapted strategies for ADLs/IADLs.  NEXT MD VISIT: unknown  OBJECTIVE:  Note: Objective measures were completed at Evaluation unless otherwise noted.  HAND DOMINANCE: Right  ADLs: Overall ADLs: unable to grip credit card to remove from ATM Transfers/ambulation related to ADLs: mod I Eating: drops silverware Grooming: drops toothbrush, uses electric toothbrush, holding comb Upper body dressing: adjusting bra Lower body dressing: difficulty pulling up pants  Toileting: hygeine was difficult initally Bathing: mod I, difficult with shaving Tub shower transfers: walk in shower    FUNCTIONAL OUTCOME MEASURES: Quick Dash: 05/30/23- 75%% disability Quick Dash 08/15/23- 70.5% disability   UPPER EXTREMITY ROM:   RUE wrist flexion/ extension: 70/ 50, LUE wrist flexion/ extension: 70/ 45 Pt demonstrates grossly 50% composite flexion for right and 555 with left.   Active ROM Right eval Left eval  Thumb MCP (0-60) 45 55  Thumb IP (0-80) 25 10  Thumb Radial abd/add (0-55)     Thumb Palmar abd/add (0-45)     Thumb Opposition to Small Finger yes yes   Index MCP (0-90)     Index PIP (0-100)     Index DIP (0-70)      Long MCP (0-90)      Long PIP (0-100)      Long DIP (0-70)       Ring MCP (0-90)      Ring PIP (0-100)      Ring DIP (0-70)      Little MCP (0-90)      Little PIP (0-100)      Little DIP (0-70)      (Blank rows = not tested) 9 hole peg test (08/15/23) RUE 26.70 secs, LUE 23 secs  HAND FUNCTION: Grip strength: Right: 8 lbs; Left: 4 lbs,          08/15/23-  RUE 18, LUE 14 lbs,           08/15/23   Pinch: RUE  tip 4lbs, lateral 8 lbs, LUE tip 3 lbs, lateral 8 lbs   SENSATION: Light touch: Impaired index finger LUE  EDEMA: Pt with bony defomities at PIP joints of all digits which pt reports are bone spurs, Pt also has bony deformity at DIP for right thumb, index and 5th digit and LUE small and index fingers  COGNITION: Overall cognitive status: Within functional limits for tasks assessed   OBSERVATIONS: Pleasant female desires to increase functional use of bilateral UE's  , TREATMENT DATE:10/24/23-Paraffin to RUE x 10 mins, no adverse reactions, while therapist performed US  to left hand 3.3 mhz, 0.8 w/cm 2,  20%x 8 mins no adverse reactions for stiffness. Paraffin to LUE x 10 mins, no adverse reactions, while therapist performed US  to right hand 3.3 mhz, 0.8 w/cm 2,  20%x 8 mins no adverse reactions for stiffness.  Gentle soft tissue and joint mobs/ distraction to digits, hand, wrist bilaterally as welll as right forearm,  Passive finger flexion to bilateral hands. Therpist checked 9 hole peg test, see goals. 10/17/23-Paraffin to RUE x 10 mins, no adverse reactions, while therapist performed US  to left hand 3.3 mhz, 0.8 w/cm 2,  20%x 8 mins no adverse reactions for stiffness. Paraffin to LUE x 10 mins, no adverse reactions, while therapist performed US  to right hand 3.3 mhz, 0.8 w/cm 2,  20%x 8 mins no adverse reactions for stiffness.  Gentle soft tissue and joint mobs/ distraction to digits, hand, wrist bilaterally as welll as right forearm, pt reports  hands and wrists are feeling looser at end of session. Passive composite finger flexion to right  hand and then individual finger flexion to left hand . Therapist checked grip strength:  RUE 23 lbs LUE 25 lbs, pt demonstrates bilateral improvements and she reports continuing improving functional use.   10/03/23 Paraffin to RUE x 10 mins, no adverse reactions, while therapist performed US  to left hand 3.3 mhz, 0.8 w/cm 2,  20%x 8 mins no adverse reactions for stiffness. Paraffin to LUE x 10 mins, no adverse reactions, while therapist performed US  to right hand 3.3 mhz, 0.8 w/cm 2,  20%x 8 mins no adverse reactions for stiffness.  Passive composite finger flexion to right hand and then individual finger flexion to left hand . Gentle soft tissue and joint mobs/ distraction to digits, hand, wrist bilaterally as welll as right foream, pt reports feeling looser Resistive gripping with  tree squeeze ball left and right UE's digi flex, light resistance for composite and individual finger flexion for LUE   PATIENT EDUCATION: Education details:   see above Person educated: Patient Education method: Explanation, demonstration, v.c Education comprehension: verbalized understanding, returned demonstration  HOME EXERCISE PROGRAM: AROM yellow putty  GOALS: Goals reviewed with patient? Yes  SHORT TERM GOALS: Target date: 09/14/23  I with HEP  Goal status:  met 06/07/23  2.  I with positioning/ splinting prn to minimize pain and defomity   Goal status: met, 07/13/23  3.  Pt will increase bilateral grip strength by 5 lbs for increased UE functional use. Upgraded goal Pt will increase bilateral grip strength to at least 25 lbs (after stretching). Baseline: 05/30/23- RUE 8 lbs, LUE 4 lbs Goal status:met 08/01/23-R 20 lbs, L  19 lbs,  08/15/23- RUE 18 lbsLUE 14 lbs continue to address upgraded goal  - RUE 19 lbs, LUE 24- 10/03/23  RUE 23 lbs LUE 25 lbs, met for LUE, ongoing for right. 10/17/23  4. Pt will demonstrate improved RUE fine motor coordination  as evidenced by perfroming 9 hole peg test in 23  secs or less.  Goal status: ongoing 10/17/23, ongoing 26.68- 10/24/23 5. I with updates to HEP.  Goal status: ongoing 10/17/23 6. Pt will demonstrate improved composite finger flexion for ADLs as evidenced by pt. bringing middle fingertip for RUE within 1/2 an inch of palm.  Baseline: 3/4 inch from palm to middle fingertip  Goal status : ongoing, 3/4 inch 09/19/23, 1 inch from palm 10/24/23 7.Pt will demonstrate improved composite finger flexion for ADLs as evidenced by pt. bringing middle fingertip for LUE within 3/4 an inch of palm.  Baseline: 1 inch from palm to middle finger tip  Goal status: met, 3/4 inch away from middle finger 09/19/23  LONG TERM GOALS: Target date:11/07/23  I with updated HEP  Goal status:met 08/15/22  2.  Pt will improve Quick Dash score to 70% disability Baseline: 75% (05/30/23) Goal status:   improved - however not fully met due to complications from shoulder pain-70.5%08/15/23  3.  Pt will demonstrate at least 65% composte flexion for LUE for increased functional use.  Goal status: met 65-70% , 08/15/23  4.  I with adapted strategies/ adapted equipment to minimize pain and to increase pt I with ADLs/IADLs  Goal status:  met for inital strategies, however continue goal as pt may report additional tasks that can benefit from modification.ongoing 10/03/23  5.  Pt will demonstrate at least 65% composite flexion for RUE for increased functional use.  Goal status: met 70% 08/15/23  6. Pt will report that she is able to cut her meat such as steak or chicken modified independently.  [place]: unable  Goal status: met 09/21/23    7. Pt will report increased ease with opening/ closing ziplock bags.    Goal status: met 09/21/23    8. Pt will improve bilateral tip pinch by 2 lbs for increased ease with daily activities.     Goal status: ongoing 09/19/23    9. Pt will improve bilateral lateral pinch by 2 lbs for increased ease with ADLs.    Goal status: ongoing  09/19/23   ASSESSMENT: CLINICAL IMPRESSION: Pt reports improved ability to use hands functionally for meal prep during [employer]. Today hand pain is 4/10 which is improved overall. PERFORMANCE DEFICITS: in functional skills including ADLs, IADLs, coordination, sensation, edema, ROM, strength, pain, flexibility, Fine motor control, Gross motor control, endurance, decreased knowledge of precautions, decreased knowledge of use of DME, and UE functional use,, and psychosocial skills including coping strategies, environmental adaptation, habits, interpersonal interactions, and routines and behaviors.   IMPAIRMENTS: are limiting patient from ADLs, IADLs, rest and sleep, education, play, leisure, and social participation.   COMORBIDITIES: may have co-morbidities  that affects occupational performance. Patient will benefit from skilled OT to address above impairments and improve overall function.  MODIFICATION OR ASSISTANCE TO COMPLETE EVALUATION: No modification of tasks or assist necessary to complete an evaluation.  OT OCCUPATIONAL PROFILE AND HISTORY: Detailed assessment: Review of records and additional review of physical, cognitive, psychosocial history related to current functional performance.  CLINICAL DECISION MAKING: LOW - limited treatment options, no task modification necessary  REHAB POTENTIAL: Good  EVALUATION COMPLEXITY: Low      PLAN:  OT FREQUENCY: 2x/week  OT DURATION: 12 weeks( anticipate d/c after 8 weeks dependent on progress)  PLANNED INTERVENTIONS: 02831 OT Re-evaluation, 97535 self care/ADL training, 02889 therapeutic exercise, 97530 therapeutic activity, 97112 neuromuscular re-education, 97140 manual therapy, [id number] aquatic therapy, 97035 ultrasound, 97018 paraffin, 02960 fluidotherapy,  97010 moist heat, 97010 cryotherapy, 97034 contrast bath, 97760 Orthotics management and training, 02239 Splinting (initial encounter), [phone] Subsequent splinting/medication, passive range of  motion, energy conservation, coping strategies training, patient/family education, and DME and/or AE instructions  RECOMMENDED OTHER SERVICES: none  CONSULTED AND AGREED WITH PLAN OF CARE: Patient  PLAN FOR NEXT SESSION:   functional use and ROM for bilateral hands   Emrey Thornley, OT 10/31/2023, 3:41 PM                     OUTPATIENT OCCUPATIONAL THERAPY ORTHO Treatment  Patient Name: Veronica Galvan MRN: [id number] DOB:1955-06-16, 68 y.o., female Today's Date: 10/31/2023  PCP: Leita Eliza Elbe [name] REFERRING PROVIDER: Leita Eliza Elbe [name]  END OF SESSION:  OT End of Session - 10/31/23 1537     Visit Number 29    Number of Visits 41    Date for OT Re-Evaluation 11/07/23    Authorization Type [employer] MCR    Authorization Time Period 12 weeks    Authorization - Visit Number 29    Progress Note Due on Visit 36    OT Start Time 1450    OT Stop Time 1530    OT Time Calculation (min) 40 min    Activity Tolerance Patient tolerated treatment well    Behavior During Therapy WFL for tasks assessed/performed                         Past Medical History:  Diagnosis Date   Allergy     Anxiety    Arthritis    hands   Cataract    Diabetes mellitus without complication (HCC)    type 2   Family history of adverse reaction to anesthesia    son has malignant hyperthermia, daughter does not daughter recently had c section without problems   GERD (gastroesophageal reflux disease)    Headache    sinus   Hyperlipidemia    Hypertension    PONV (postoperative nausea and vomiting)    nausea only   Ulcer, stomach peptic yrs ago   Past Surgical History:  Procedure Laterality Date   APPENDECTOMY     both hells bone spur repair     both heels with metal clips   both shoulder rotator cuff repair     CESAREAN SECTION     x 1   COLONOSCOPY WITH PROPOFOL  N/A 09/07/2016   Procedure: COLONOSCOPY WITH PROPOFOL ;  Surgeon: Vicci Gladis POUR, MD;   Location: [facility];  Service: Endoscopy;  Laterality: N/A;   colonscopy  06/2011   polyps   ELBOW FRACTURE SURGERY Left    EYE SURGERY     FRACTURE SURGERY     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/10/2022   Procedure: REVERSE SHOULDER ARTHROPLASTY;  Surgeon: Melita Drivers, MD;  Location: [facility];  Service: Orthopedics;  Laterality: Right;  Please follow in room 6 if able   TUBAL LIGATION     VESICO-VAGINAL FISTULA REPAIR     Patient Active Problem List   Diagnosis Date Noted   Gastroesophageal reflux disease 11/16/2021   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 08/18/2021   Dermatofibroma 08/18/2021   History of malignant neoplasm of skin 08/18/2021   Lentigo 08/18/2021   Melanocytic nevi of trunk 08/18/2021   Nevus lipomatosus cutaneous superficialis 08/18/2021   Rosacea 08/18/2021   Actinic keratosis 08/18/2021   Sensorineural hearing loss (SNHL) of left ear with unrestricted hearing of right ear 07/26/2021  Tinnitus of left ear 07/26/2021   Acute recurrent maxillary sinusitis 06/22/2021   Primary hypertension 01/12/2021   Hyperlipidemia associated with type 2 diabetes mellitus (HCC) 01/12/2021   Arthritis 01/12/2021   Type II diabetes mellitus (HCC) 11/25/2019   Ingrown toenail 09/05/2018   Neck pain 01/29/2018   Tick bite 10/24/2017    ONSET DATE: 05/19/23  REFERRING DIAG:  Diagnosis  M79.641,M79.642 (ICD-10-CM) - Bilateral hand pain    THERAPY DIAG:  Muscle weakness (generalized)  Stiffness of left hand, not elsewhere classified  Stiffness of right hand, not elsewhere classified  Pain in right hand  Pain in left hand  Other lack of coordination  Rationale for Evaluation and Treatment: Rehabilitation  SUBJECTIVE:   SUBJECTIVE STATEMENT: Pt reports  her hands felt looser after last OT session Pt accompanied by: self  PERTINENT HISTORY: 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 Yes: NPRS scale: 7/10- right hand Pain location: right hand,  3/10 left hand Pain description: aching, stiff Aggravating factors: gripping  Relieving factors: heat, meds Pt also has shoulder pain which is more significant grossly R shoulder 8/10 L shoulder pain 3/10 which OT will not address. PT is addressing shoulder FALLS: Has patient fallen in last 6 months? No  LIVING ENVIRONMENT: Lives with: lives alone Lives in: House/apartment Stairs: yes   PLOF: Independent  PATIENT GOALS: improve functional use of hands and learn adapted strategies for ADLs/IADLs.  NEXT MD VISIT: unknown  OBJECTIVE:  Note: Objective measures were completed at Evaluation unless otherwise noted.  HAND DOMINANCE: Right  ADLs: Overall ADLs: unable to grip credit card to remove from ATM Transfers/ambulation related to ADLs: mod I Eating: drops silverware Grooming: drops toothbrush, uses electric toothbrush, holding comb Upper body dressing: adjusting bra Lower body dressing: difficulty pulling up pants  Toileting: hygeine was difficult initally Bathing: mod I, difficult with shaving Tub shower transfers: walk in shower    FUNCTIONAL OUTCOME MEASURES: Quick Dash: 05/30/23- 75%% disability Quick Dash 08/15/23- 70.5% disability   UPPER EXTREMITY ROM:   RUE wrist flexion/ extension: 70/ 50, LUE wrist flexion/ extension: 70/ 45 Pt demonstrates grossly 50% composite flexion for right and 555 with left.   Active ROM Right eval Left eval  Thumb MCP (0-60) 45 55  Thumb IP (0-80) 25 10  Thumb Radial abd/add (0-55)     Thumb Palmar abd/add (0-45)     Thumb Opposition to Small Finger yes yes   Index MCP (0-90)     Index PIP (0-100)     Index DIP (0-70)      Long MCP (0-90)      Long PIP (0-100)      Long DIP  (0-70)      Ring MCP (0-90)      Ring PIP (0-100)      Ring DIP (0-70)      Little MCP (0-90)      Little PIP (0-100)      Little DIP (0-70)      (Blank rows = not tested) 9 hole peg test (08/15/23) RUE 26.70 secs, LUE 23 secs  HAND FUNCTION: Grip strength: Right: 8 lbs; Left: 4 lbs,          08/15/23-  RUE 18, LUE 14 lbs,  08/15/23   Pinch: RUE tip 4lbs, lateral 8 lbs, LUE tip 3 lbs, lateral 8 lbs   SENSATION: Light touch: Impaired index finger LUE  EDEMA: Pt with bony defomities at PIP joints of all digits which pt reports are bone spurs, Pt also has bony deformity at DIP for right thumb, index and 5th digit and LUE small and index fingers  COGNITION: Overall cognitive status: Within functional limits for tasks assessed   OBSERVATIONS: Pleasant female desires to increase functional use of bilateral UE's  , TREATMENT DATE:Paraffin to RUE x 10 mins, no adverse reactions, while therapist performed US  to left hand 3.3 mhz, 0.8 w/cm 2,  20%x 8 mins no adverse reactions for stiffness. Paraffin to LUE x 10 mins, no adverse reactions, while therapist performed US  to right hand 3.3 mhz, 0.8 w/cm 2,  20%x 8 mins no adverse reactions for stiffness.  Gentle soft tissue and joint mobs/ distraction to digits, hand, wrist bilaterally as welll as right forearm, pt reports  hands and wrists are feeling looser at end of session. Passive composite finger flexion to right hand and then individual finger flexion to left hand . Therapist checked grip strength:  RUE 23 lbs LUE 25 lbs, pt demonstrates bilateral improvements and she reports continuing improving functional use.   10/03/23 Paraffin to RUE x 10 mins, no adverse reactions, while therapist performed US  to left hand 3.3 mhz, 0.8 w/cm 2,  20%x 8 mins no adverse reactions for stiffness. Paraffin to LUE x 10 mins, no adverse reactions, while therapist performed US  to right hand 3.3 mhz, 0.8 w/cm 2,  20%x 8 mins no adverse reactions for  stiffness.  Passive composite finger flexion to right hand and then individual finger flexion to left hand . Gentle soft tissue and joint mobs/ distraction to digits, hand, wrist bilaterally as welll as right foream, pt reports feeling looser Resistive gripping with  tree squeeze ball left and right UE's digi flex, light resistance for composite and individual finger flexion for LUE   PATIENT EDUCATION: Education details:   see above Person educated: Patient Education method: Explanation, demonstration, v.c Education comprehension: verbalized understanding, returned demonstration  HOME EXERCISE PROGRAM: AROM yellow putty  GOALS: Goals reviewed with patient? Yes  SHORT TERM GOALS: Target date: 09/14/23  I with HEP  Goal status:  met 06/07/23  2.  I with positioning/ splinting prn to minimize pain and defomity   Goal status: met, 07/13/23  3.  Pt will increase bilateral grip strength by 5 lbs for increased UE functional use. Upgraded goal Pt will increase bilateral grip strength to at least 25 lbs (after stretching). Baseline: 05/30/23- RUE 8 lbs, LUE 4 lbs Goal status:met 08/01/23-R 20 lbs, L  19 lbs,  08/15/23- RUE 18 lbsLUE 14 lbs continue to address upgraded goal  - RUE 19 lbs, LUE 24- 10/03/23  RUE 23 lbs LUE 25 lbs, met for LUE, ongoing for right. 10/17/23  4. Pt will demonstrate improved RUE fine motor coordination as evidenced by perfroming 9 hole peg test in 23 secs or less.  Goal status: ongoing 10/17/23 5. I with updates to HEP.  Goal status: ongoing 10/17/23 6. Pt will demonstrate improved composite finger flexion for ADLs as evidenced by pt. bringing middle fingertip for RUE within 1/2 an inch of palm.  Baseline: 3/4 inch from palm to middle fingertip  Goal status : ongoing, 3/4 inch 09/19/23 7.Pt will demonstrate improved composite finger flexion for ADLs as evidenced by pt. bringing middle fingertip for  LUE within 3/4 an inch of palm.  Baseline: 1 inch from palm to middle  finger tip  Goal status: met, 3/4 inch away from middle finger 09/19/23  LONG TERM GOALS: Target date:11/07/23  I with updated HEP  Goal status:met 08/15/22  2.  Pt will improve Quick Dash score to 70% disability Baseline: 75% (05/30/23) Goal status:   improved - however not fully met due to complications from shoulder pain-70.5%08/15/23  3.  Pt will demonstrate at least 65% composte flexion for LUE for increased functional use.  Goal status: met 65-70% , 08/15/23  4.  I with adapted strategies/ adapted equipment to minimize pain and to increase pt I with ADLs/IADLs  Goal status:  met for inital strategies, however continue goal as pt may report additional tasks that can benefit from modification.ongoing 10/03/23  5.  Pt will demonstrate at least 65% composite flexion for RUE for increased functional use.  Goal status: met 70% 08/15/23  6. Pt will report that she is able to cut her meat such as steak or chicken modified independently.  [place]: unable  Goal status: met 09/21/23    7. Pt will report increased ease with opening/ closing ziplock bags.    Goal status: met 09/21/23    8. Pt will improve bilateral tip pinch by 2 lbs for increased ease with daily activities.     Goal status: ongoing 09/19/23    9. Pt will improve bilateral lateral pinch by 2 lbs for increased ease with ADLs.    Goal status: ongoing 09/19/23   ASSESSMENT: CLINICAL IMPRESSION: Pt demonstrates improving flexibility and grip strength.PERFORMANCE DEFICITS: in functional skills including ADLs, IADLs, coordination, sensation, edema, ROM, strength, pain, flexibility, Fine motor control, Gross motor control, endurance, decreased knowledge of precautions, decreased knowledge of use of DME, and UE functional use,, and psychosocial skills including coping strategies, environmental adaptation, habits, interpersonal interactions, and routines and behaviors.   IMPAIRMENTS: are limiting patient from ADLs, IADLs, rest and sleep,  education, play, leisure, and social participation.   COMORBIDITIES: may have co-morbidities  that affects occupational performance. Patient will benefit from skilled OT to address above impairments and improve overall function.  MODIFICATION OR ASSISTANCE TO COMPLETE EVALUATION: No modification of tasks or assist necessary to complete an evaluation.  OT OCCUPATIONAL PROFILE AND HISTORY: Detailed assessment: Review of records and additional review of physical, cognitive, psychosocial history related to current functional performance.  CLINICAL DECISION MAKING: LOW - limited treatment options, no task modification necessary  REHAB POTENTIAL: Good  EVALUATION COMPLEXITY: Low      PLAN:  OT FREQUENCY: 2x/week  OT DURATION: 12 weeks( anticipate d/c after 8 weeks dependent on progress)  PLANNED INTERVENTIONS: 02831 OT Re-evaluation, 97535 self care/ADL training, 02889 therapeutic exercise, 97530 therapeutic activity, 97112 neuromuscular re-education, 97140 manual therapy, 97113 aquatic therapy, 97035 ultrasound, 97018 paraffin, 02960 fluidotherapy, 97010 moist heat, 97010 cryotherapy, 97034 contrast bath, 97760 Orthotics management and training, 02239 Splinting (initial encounter), [id number] Subsequent splinting/medication, passive range of motion, energy conservation, coping strategies training, patient/family education, and DME and/or AE instructions  RECOMMENDED OTHER SERVICES: none  CONSULTED AND AGREED WITH PLAN OF CARE: Patient  PLAN FOR NEXT SESSION:   check progress towards goals and and plan to renew.   Guerry Covington, OT 10/31/2023, 3:41 PM                     OUTPATIENT OCCUPATIONAL THERAPY ORTHO Treatment  Patient Name: Veronica Galvan MRN: [id number] DOB:1955/08/19, 68 y.o., female Today's Date:  10/31/2023  PCP: Leita Eliza Elbe [name] REFERRING PROVIDER: Leita Eliza Elbe [name]  END OF SESSION:  OT End of Session - 10/31/23 1537     Visit Number 29     Number of Visits 41    Date for OT Re-Evaluation 11/07/23    Authorization Type [employer] MCR    Authorization Time Period 12 weeks    Authorization - Visit Number 29    Progress Note Due on Visit 36    OT Start Time 1450    OT Stop Time 1530    OT Time Calculation (min) 40 min    Activity Tolerance Patient tolerated treatment well    Behavior During Therapy WFL for tasks assessed/performed                         Past Medical History:  Diagnosis Date   Allergy     Anxiety    Arthritis    hands   Cataract    Diabetes mellitus without complication (HCC)    type 2   Family history of adverse reaction to anesthesia    son has malignant hyperthermia, daughter does not daughter recently had c section without problems   GERD (gastroesophageal reflux disease)    Headache    sinus   Hyperlipidemia    Hypertension    PONV (postoperative nausea and vomiting)    nausea only   Ulcer, stomach peptic yrs ago   Past Surgical History:  Procedure Laterality Date   APPENDECTOMY     both hells bone spur repair     both heels with metal clips   both shoulder rotator cuff repair     CESAREAN SECTION     x 1   COLONOSCOPY WITH PROPOFOL  N/A 09/07/2016   Procedure: COLONOSCOPY WITH PROPOFOL ;  Surgeon: Vicci Gladis POUR, MD;  Location: [facility];  Service: Endoscopy;  Laterality: N/A;   colonscopy  06/2011   polyps   ELBOW FRACTURE SURGERY Left    EYE SURGERY     FRACTURE SURGERY     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/10/2022   Procedure: REVERSE SHOULDER ARTHROPLASTY;  Surgeon: Melita Drivers, MD;  Location: [facility];  Service: Orthopedics;  Laterality: Right;  Please follow in room 6 if able   TUBAL LIGATION     VESICO-VAGINAL FISTULA REPAIR     Patient Active Problem List   Diagnosis Date Noted   Gastroesophageal reflux disease 11/16/2021   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 08/18/2021   Dermatofibroma 08/18/2021   History of  malignant neoplasm of skin 08/18/2021   Lentigo 08/18/2021   Melanocytic nevi of trunk 08/18/2021   Nevus lipomatosus cutaneous superficialis 08/18/2021   Rosacea 08/18/2021   Actinic keratosis 08/18/2021   Sensorineural hearing loss (SNHL) of left ear with unrestricted hearing of right ear 07/26/2021   Tinnitus of left ear 07/26/2021   Acute recurrent maxillary sinusitis 06/22/2021   Primary hypertension 01/12/2021   Hyperlipidemia associated with type 2 diabetes mellitus (HCC) 01/12/2021   Arthritis 01/12/2021   Type II diabetes mellitus (HCC) 11/25/2019   Ingrown toenail 09/05/2018   Neck pain 01/29/2018   Tick bite 10/24/2017    ONSET DATE: 05/19/23  REFERRING DIAG:  Diagnosis  M79.641,M79.642 (ICD-10-CM) - Bilateral hand pain    THERAPY DIAG:  Muscle weakness (generalized)  Stiffness of left hand, not elsewhere classified  Stiffness of right hand, not elsewhere classified  Pain in right hand  Pain in left hand  Other lack of coordination  Rationale for Evaluation and Treatment: Rehabilitation  SUBJECTIVE:   SUBJECTIVE STATEMENT: Pt reports  her hands felt looser after last OT session Pt accompanied by: self  PERTINENT HISTORY: 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 Yes: NPRS scale: 4/10 Pain location: right hand,  left hand Pain description: aching, stiff Aggravating factors: gripping  Relieving factors: heat, meds Pt also has shoulder pain which is more significant grossly R shoulder 7/10 L shoulder pain 3/10 which OT will not address. PT is addressing shoulder FALLS: Has patient fallen in last 6 months? No  LIVING ENVIRONMENT: Lives with: lives alone Lives in: House/apartment Stairs: yes   PLOF:  Independent  PATIENT GOALS: improve functional use of hands and learn adapted strategies for ADLs/IADLs.  NEXT MD VISIT: unknown  OBJECTIVE:  Note: Objective measures were completed at Evaluation unless otherwise noted.  HAND DOMINANCE: Right  ADLs: Overall ADLs: unable to grip credit card to remove from ATM Transfers/ambulation related to ADLs: mod I Eating: drops silverware Grooming: drops toothbrush, uses electric toothbrush, holding comb Upper body dressing: adjusting bra Lower body dressing: difficulty pulling up pants  Toileting: hygeine was difficult initally Bathing: mod I, difficult with shaving Tub shower transfers: walk in shower    FUNCTIONAL OUTCOME MEASURES: Quick Dash: 05/30/23- 75%% disability Quick Dash 08/15/23- 70.5% disability   UPPER EXTREMITY ROM:   RUE wrist flexion/ extension: 70/ 50, LUE wrist flexion/ extension: 70/ 45 Pt demonstrates grossly 50% composite flexion for right and 555 with left.   Active ROM Right eval Left eval  Thumb MCP (0-60) 45 55  Thumb IP (0-80) 25 10  Thumb Radial abd/add (0-55)     Thumb Palmar abd/add (0-45)     Thumb Opposition to Small Finger yes yes   Index MCP (0-90)     Index PIP (0-100)     Index DIP (0-70)      Long MCP (0-90)      Long PIP (0-100)      Long DIP (0-70)      Ring MCP (0-90)      Ring PIP (0-100)      Ring DIP (0-70)      Little MCP (0-90)      Little PIP (0-100)      Little DIP (0-70)      (Blank rows = not tested) 9 hole peg test (08/15/23) RUE 26.70 secs, LUE 23 secs  HAND FUNCTION: Grip strength: Right: 8 lbs; Left: 4 lbs,          08/15/23-  RUE 18, LUE 14 lbs,           08/15/23   Pinch: RUE tip 4lbs, lateral 8 lbs, LUE tip 3 lbs, lateral 8 lbs   SENSATION: Light touch: Impaired index finger LUE  EDEMA: Pt with bony defomities at PIP joints of all digits which pt reports are bone spurs, Pt also has bony deformity at DIP for right thumb, index and 5th digit and LUE small and index  fingers  COGNITION: Overall cognitive status: Within functional limits for tasks assessed   OBSERVATIONS: Pleasant female desires to increase functional use of bilateral UE's  , TREATMENT DATE:10/24/23-Paraffin to RUE x 10 mins, no adverse reactions, while therapist performed US  to left hand  3.3 mhz, 0.8 w/cm 2,  20%x 8 mins no adverse reactions for stiffness. Paraffin to LUE x 10 mins, no adverse reactions, while therapist performed US  to right hand 3.3 mhz, 0.8 w/cm 2,  20%x 8 mins no adverse reactions for stiffness.  Gentle soft tissue and joint mobs/ distraction to digits, hand, wrist bilaterally as welll as right forearm,  Passive finger flexion to bilateral hands. Therpist checked 9 hole peg test, see goals. 10/17/23-Paraffin to RUE x 10 mins, no adverse reactions, while therapist performed US  to left hand 3.3 mhz, 0.8 w/cm 2,  20%x 8 mins no adverse reactions for stiffness. Paraffin to LUE x 10 mins, no adverse reactions, while therapist performed US  to right hand 3.3 mhz, 0.8 w/cm 2,  20%x 8 mins no adverse reactions for stiffness.  Gentle soft tissue and joint mobs/ distraction to digits, hand, wrist bilaterally as welll as right forearm, pt reports  hands and wrists are feeling looser at end of session. Passive composite finger flexion to right hand and then individual finger flexion to left hand . Therapist checked grip strength:  RUE 23 lbs LUE 25 lbs, pt demonstrates bilateral improvements and she reports continuing improving functional use.   10/03/23 Paraffin to RUE x 10 mins, no adverse reactions, while therapist performed US  to left hand 3.3 mhz, 0.8 w/cm 2,  20%x 8 mins no adverse reactions for stiffness. Paraffin to LUE x 10 mins, no adverse reactions, while therapist performed US  to right hand 3.3 mhz, 0.8 w/cm 2,  20%x 8 mins no adverse reactions for stiffness.  Passive composite finger flexion to right hand and then individual finger flexion to left hand . Gentle soft tissue  and joint mobs/ distraction to digits, hand, wrist bilaterally as welll as right foream, pt reports feeling looser Resistive gripping with  tree squeeze ball left and right UE's digi flex, light resistance for composite and individual finger flexion for LUE   PATIENT EDUCATION: Education details:   see above Person educated: Patient Education method: Explanation, demonstration, v.c Education comprehension: verbalized understanding, returned demonstration  HOME EXERCISE PROGRAM: AROM yellow putty  GOALS: Goals reviewed with patient? Yes  SHORT TERM GOALS: Target date: 09/14/23  I with HEP  Goal status:  met 06/07/23  2.  I with positioning/ splinting prn to minimize pain and defomity   Goal status: met, 07/13/23  3.  Pt will increase bilateral grip strength by 5 lbs for increased UE functional use. Upgraded goal Pt will increase bilateral grip strength to at least 25 lbs (after stretching). Baseline: 05/30/23- RUE 8 lbs, LUE 4 lbs Goal status:met 08/01/23-R 20 lbs, L  19 lbs,  08/15/23- RUE 18 lbsLUE 14 lbs continue to address upgraded goal  - RUE 19 lbs, LUE 24- 10/03/23  RUE 23 lbs LUE 25 lbs, met for LUE, ongoing for right. 10/17/23  4. Pt will demonstrate improved RUE fine motor coordination as evidenced by perfroming 9 hole peg test in 23 secs or less.  Goal status: ongoing 10/17/23, ongoing 26.68- 10/24/23 5. I with updates to HEP.  Goal status: ongoing 10/17/23 6. Pt will demonstrate improved composite finger flexion for ADLs as evidenced by pt. bringing middle fingertip for RUE within 1/2 an inch of palm.  Baseline: 3/4 inch from palm to middle fingertip  Goal status : ongoing, 3/4 inch 09/19/23, 1 inch from palm 10/24/23 7.Pt will demonstrate improved composite finger flexion for ADLs as evidenced by pt. bringing middle fingertip for LUE within 3/4 an inch of  palm.  Baseline: 1 inch from palm to middle finger tip  Goal status: met, 3/4 inch away from middle finger 09/19/23  LONG  TERM GOALS: Target date:11/07/23  I with updated HEP  Goal status:met 08/15/22  2.  Pt will improve Quick Dash score to 70% disability Baseline: 75% (05/30/23) Goal status:   improved - however not fully met due to complications from shoulder pain-70.5%08/15/23  3.  Pt will demonstrate at least 65% composte flexion for LUE for increased functional use.  Goal status: met 65-70% , 08/15/23  4.  I with adapted strategies/ adapted equipment to minimize pain and to increase pt I with ADLs/IADLs  Goal status:  met for inital strategies, however continue goal as pt may report additional tasks that can benefit from modification.ongoing 10/03/23  5.  Pt will demonstrate at least 65% composite flexion for RUE for increased functional use.  Goal status: met 70% 08/15/23  6. Pt will report that she is able to cut her meat such as steak or chicken modified independently.  [place]: unable  Goal status: met 09/21/23    7. Pt will report increased ease with opening/ closing ziplock bags.    Goal status: met 09/21/23    8. Pt will improve bilateral tip pinch by 2 lbs for increased ease with daily activities.     Goal status: ongoing 09/19/23    9. Pt will improve bilateral lateral pinch by 2 lbs for increased ease with ADLs.    Goal status: ongoing 09/19/23   ASSESSMENT: CLINICAL IMPRESSION: Pt reports improved ability to use hands functionally for meal prep during VBS. Today hand pain is 4/10 which is improved overall. PERFORMANCE DEFICITS: in functional skills including ADLs, IADLs, coordination, sensation, edema, ROM, strength, pain, flexibility, Fine motor control, Gross motor control, endurance, decreased knowledge of precautions, decreased knowledge of use of DME, and UE functional use,, and psychosocial skills including coping strategies, environmental adaptation, habits, interpersonal interactions, and routines and behaviors.   IMPAIRMENTS: are limiting patient from ADLs, IADLs, rest and sleep,  education, play, leisure, and social participation.   COMORBIDITIES: may have co-morbidities  that affects occupational performance. Patient will benefit from skilled OT to address above impairments and improve overall function.  MODIFICATION OR ASSISTANCE TO COMPLETE EVALUATION: No modification of tasks or assist necessary to complete an evaluation.  OT OCCUPATIONAL PROFILE AND HISTORY: Detailed assessment: Review of records and additional review of physical, cognitive, psychosocial history related to current functional performance.  CLINICAL DECISION MAKING: LOW - limited treatment options, no task modification necessary  REHAB POTENTIAL: Good  EVALUATION COMPLEXITY: Low      PLAN:  OT FREQUENCY: 2x/week  OT DURATION: 12 weeks( anticipate d/c after 8 weeks dependent on progress)  PLANNED INTERVENTIONS: 02831 OT Re-evaluation, 97535 self care/ADL training, 02889 therapeutic exercise, 97530 therapeutic activity, 97112 neuromuscular re-education, 97140 manual therapy, 97113 aquatic therapy, 97035 ultrasound, 97018 paraffin, 02960 fluidotherapy, 97010 moist heat, 97010 cryotherapy, 97034 contrast bath, 97760 Orthotics management and training, 02239 Splinting (initial encounter), [id number] Subsequent splinting/medication, passive range of motion, energy conservation, coping strategies training, patient/family education, and DME and/or AE instructions  RECOMMENDED OTHER SERVICES: none  CONSULTED AND AGREED WITH PLAN OF CARE: Patient  PLAN FOR NEXT SESSION:   functional use and ROM for bilateral hands   Kerly Rigsbee, OT 10/31/2023, 3:41 PM                     OUTPATIENT OCCUPATIONAL THERAPY ORTHO Treatment  Patient Name: Veronica Galvan  MRN: [id number] DOB:April 05, 1956, 68 y.o., female Today's Date: 10/31/2023  PCP: Leita Eliza Elbe [name] REFERRING PROVIDER: Leita Eliza Elbe [name]  END OF SESSION:  OT End of Session - 10/31/23 1537     Visit Number 29     Number of Visits 41    Date for OT Re-Evaluation 11/07/23    Authorization Type [employer] MCR    Authorization Time Period 12 weeks    Authorization - Visit Number 29    Progress Note Due on Visit 36    OT Start Time 1450    OT Stop Time 1530    OT Time Calculation (min) 40 min    Activity Tolerance Patient tolerated treatment well    Behavior During Therapy WFL for tasks assessed/performed                         Past Medical History:  Diagnosis Date   Allergy     Anxiety    Arthritis    hands   Cataract    Diabetes mellitus without complication (HCC)    type 2   Family history of adverse reaction to anesthesia    son has malignant hyperthermia, daughter does not daughter recently had c section without problems   GERD (gastroesophageal reflux disease)    Headache    sinus   Hyperlipidemia    Hypertension    PONV (postoperative nausea and vomiting)    nausea only   Ulcer, stomach peptic yrs ago   Past Surgical History:  Procedure Laterality Date   APPENDECTOMY     both hells bone spur repair     both heels with metal clips   both shoulder rotator cuff repair     CESAREAN SECTION     x 1   COLONOSCOPY WITH PROPOFOL  N/A 09/07/2016   Procedure: COLONOSCOPY WITH PROPOFOL ;  Surgeon: Vicci Gladis POUR, MD;  Location: [facility];  Service: Endoscopy;  Laterality: N/A;   colonscopy  06/2011   polyps   ELBOW FRACTURE SURGERY Left    EYE SURGERY     FRACTURE SURGERY     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/10/2022   Procedure: REVERSE SHOULDER ARTHROPLASTY;  Surgeon: Melita Drivers, MD;  Location: [facility];  Service: Orthopedics;  Laterality: Right;  Please follow in room 6 if able   TUBAL LIGATION     VESICO-VAGINAL FISTULA REPAIR     Patient Active Problem List   Diagnosis Date Noted   Gastroesophageal reflux disease 11/16/2021   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 08/18/2021   Dermatofibroma 08/18/2021   History of  malignant neoplasm of skin 08/18/2021   Lentigo 08/18/2021   Melanocytic nevi of trunk 08/18/2021   Nevus lipomatosus cutaneous superficialis 08/18/2021   Rosacea 08/18/2021   Actinic keratosis 08/18/2021   Sensorineural hearing loss (SNHL) of left ear with unrestricted hearing of right ear 07/26/2021   Tinnitus of left ear 07/26/2021   Acute recurrent maxillary sinusitis 06/22/2021   Primary hypertension 01/12/2021   Hyperlipidemia associated with type 2 diabetes mellitus (HCC) 01/12/2021   Arthritis 01/12/2021   Type II diabetes mellitus (HCC) 11/25/2019   Ingrown toenail 09/05/2018   Neck pain 01/29/2018   Tick bite 10/24/2017    ONSET DATE: 05/19/23  REFERRING DIAG:  Diagnosis  M79.641,M79.642 (ICD-10-CM) - Bilateral hand pain    THERAPY DIAG:  Muscle weakness (generalized)  Stiffness of left hand, not elsewhere classified  Stiffness of right hand, not elsewhere classified  Pain in  right hand  Pain in left hand  Other lack of coordination  Rationale for Evaluation and Treatment: Rehabilitation  SUBJECTIVE:   SUBJECTIVE STATEMENT: Pt reports  her hands felt looser after last OT session Pt accompanied by: self  PERTINENT HISTORY: 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 Yes: NPRS scale: 7/10- right hand Pain location: right hand,  3/10 left hand Pain description: aching, stiff Aggravating factors: gripping  Relieving factors: heat, meds Pt also has shoulder pain which is more significant grossly R shoulder 8/10 L shoulder pain 3/10 which OT will not address. PT is addressing shoulder FALLS: Has patient fallen in last 6 months? No  LIVING ENVIRONMENT: Lives with: lives alone Lives in: House/apartment Stairs: yes   PLOF:  Independent  PATIENT GOALS: improve functional use of hands and learn adapted strategies for ADLs/IADLs.  NEXT MD VISIT: unknown  OBJECTIVE:  Note: Objective measures were completed at Evaluation unless otherwise noted.  HAND DOMINANCE: Right  ADLs: Overall ADLs: unable to grip credit card to remove from ATM Transfers/ambulation related to ADLs: mod I Eating: drops silverware Grooming: drops toothbrush, uses electric toothbrush, holding comb Upper body dressing: adjusting bra Lower body dressing: difficulty pulling up pants  Toileting: hygeine was difficult initally Bathing: mod I, difficult with shaving Tub shower transfers: walk in shower    FUNCTIONAL OUTCOME MEASURES: Quick Dash: 05/30/23- 75%% disability Quick Dash 08/15/23- 70.5% disability   UPPER EXTREMITY ROM:   RUE wrist flexion/ extension: 70/ 50, LUE wrist flexion/ extension: 70/ 45 Pt demonstrates grossly 50% composite flexion for right and 555 with left.   Active ROM Right eval Left eval  Thumb MCP (0-60) 45 55  Thumb IP (0-80) 25 10  Thumb Radial abd/add (0-55)     Thumb Palmar abd/add (0-45)     Thumb Opposition to Small Finger yes yes   Index MCP (0-90)     Index PIP (0-100)     Index DIP (0-70)      Long MCP (0-90)      Long PIP (0-100)      Long DIP (0-70)      Ring MCP (0-90)      Ring PIP (0-100)      Ring DIP (0-70)      Little MCP (0-90)      Little PIP (0-100)      Little DIP (0-70)      (Blank rows = not tested) 9 hole peg test (08/15/23) RUE 26.70 secs, LUE 23 secs  HAND FUNCTION: Grip strength: Right: 8 lbs; Left: 4 lbs,          08/15/23-  RUE 18, LUE 14 lbs,           08/15/23   Pinch: RUE tip 4lbs, lateral 8 lbs, LUE tip 3 lbs, lateral 8 lbs   SENSATION: Light touch: Impaired index finger LUE  EDEMA: Pt with bony defomities at PIP joints of all digits which pt reports are bone spurs, Pt also has bony deformity at DIP for right thumb, index and 5th digit and LUE small and index  fingers  COGNITION: Overall cognitive status: Within functional limits for tasks assessed   OBSERVATIONS: Pleasant female desires to increase functional use of bilateral UE's  , TREATMENT DATE:Paraffin to RUE x 10  mins, no adverse reactions, while therapist performed US  to left hand 3.3 mhz, 0.8 w/cm 2,  20%x 8 mins no adverse reactions for stiffness. Paraffin to LUE x 10 mins, no adverse reactions, while therapist performed US  to right hand 3.3 mhz, 0.8 w/cm 2,  20%x 8 mins no adverse reactions for stiffness.  Gentle soft tissue and joint mobs/ distraction to digits, hand, wrist bilaterally as welll as right forearm, pt reports  hands and wrists are feeling looser at end of session. Passive composite finger flexion to right hand and then individual finger flexion to left hand . Therapist checked grip strength:  RUE 23 lbs LUE 25 lbs, pt demonstrates bilateral improvements and she reports continuing improving functional use.   10/03/23 Paraffin to RUE x 10 mins, no adverse reactions, while therapist performed US  to left hand 3.3 mhz, 0.8 w/cm 2,  20%x 8 mins no adverse reactions for stiffness. Paraffin to LUE x 10 mins, no adverse reactions, while therapist performed US  to right hand 3.3 mhz, 0.8 w/cm 2,  20%x 8 mins no adverse reactions for stiffness.  Passive composite finger flexion to right hand and then individual finger flexion to left hand . Gentle soft tissue and joint mobs/ distraction to digits, hand, wrist bilaterally as welll as right foream, pt reports feeling looser Resistive gripping with  tree squeeze ball left and right UE's digi flex, light resistance for composite and individual finger flexion for LUE   PATIENT EDUCATION: Education details:   see above Person educated: Patient Education method: Explanation, demonstration, v.c Education comprehension: verbalized understanding, returned demonstration  HOME EXERCISE PROGRAM: AROM yellow putty  GOALS: Goals reviewed  with patient? Yes  SHORT TERM GOALS: Target date: 09/14/23  I with HEP  Goal status:  met 06/07/23  2.  I with positioning/ splinting prn to minimize pain and defomity   Goal status: met, 07/13/23  3.  Pt will increase bilateral grip strength by 5 lbs for increased UE functional use. Upgraded goal Pt will increase bilateral grip strength to at least 25 lbs (after stretching). Baseline: 05/30/23- RUE 8 lbs, LUE 4 lbs Goal status:met 08/01/23-R 20 lbs, L  19 lbs,  08/15/23- RUE 18 lbsLUE 14 lbs continue to address upgraded goal  - RUE 19 lbs, LUE 24- 10/03/23  RUE 23 lbs LUE 25 lbs, met for LUE, ongoing for right. 10/17/23  4. Pt will demonstrate improved RUE fine motor coordination as evidenced by perfroming 9 hole peg test in 23 secs or less.  Goal status: ongoing 10/17/23 5. I with updates to HEP.  Goal status: ongoing 10/17/23 6. Pt will demonstrate improved composite finger flexion for ADLs as evidenced by pt. bringing middle fingertip for RUE within 1/2 an inch of palm.  Baseline: 3/4 inch from palm to middle fingertip  Goal status : ongoing, 3/4 inch 09/19/23 7.Pt will demonstrate improved composite finger flexion for ADLs as evidenced by pt. bringing middle fingertip for LUE within 3/4 an inch of palm.  Baseline: 1 inch from palm to middle finger tip  Goal status: met, 3/4 inch away from middle finger 09/19/23  LONG TERM GOALS: Target date:11/07/23  I with updated HEP  Goal status:met 08/15/22  2.  Pt will improve Quick Dash score to 70% disability Baseline: 75% (05/30/23) Goal status:   improved - however not fully met due to complications from shoulder pain-70.5%08/15/23  3.  Pt will demonstrate at least 65% composte flexion for LUE for increased functional use.  Goal status: met 65-70% ,  08/15/23  4.  I with adapted strategies/ adapted equipment to minimize pain and to increase pt I with ADLs/IADLs  Goal status:  met for inital strategies, however continue goal as pt may report  additional tasks that can benefit from modification.ongoing 10/03/23  5.  Pt will demonstrate at least 65% composite flexion for RUE for increased functional use.  Goal status: met 70% 08/15/23  6. Pt will report that she is able to cut her meat such as steak or chicken modified independently.  [place]: unable  Goal status: met 09/21/23    7. Pt will report increased ease with opening/ closing ziplock bags.    Goal status: met 09/21/23    8. Pt will improve bilateral tip pinch by 2 lbs for increased ease with daily activities.     Goal status: ongoing 09/19/23    9. Pt will improve bilateral lateral pinch by 2 lbs for increased ease with ADLs.    Goal status: ongoing 09/19/23   ASSESSMENT: CLINICAL IMPRESSION: Pt demonstrates improving flexibility and grip strength.PERFORMANCE DEFICITS: in functional skills including ADLs, IADLs, coordination, sensation, edema, ROM, strength, pain, flexibility, Fine motor control, Gross motor control, endurance, decreased knowledge of precautions, decreased knowledge of use of DME, and UE functional use,, and psychosocial skills including coping strategies, environmental adaptation, habits, interpersonal interactions, and routines and behaviors.   IMPAIRMENTS: are limiting patient from ADLs, IADLs, rest and sleep, education, play, leisure, and social participation.   COMORBIDITIES: may have co-morbidities  that affects occupational performance. Patient will benefit from skilled OT to address above impairments and improve overall function.  MODIFICATION OR ASSISTANCE TO COMPLETE EVALUATION: No modification of tasks or assist necessary to complete an evaluation.  OT OCCUPATIONAL PROFILE AND HISTORY: Detailed assessment: Review of records and additional review of physical, cognitive, psychosocial history related to current functional performance.  CLINICAL DECISION MAKING: LOW - limited treatment options, no task modification necessary  REHAB POTENTIAL:  Good  EVALUATION COMPLEXITY: Low      PLAN:  OT FREQUENCY: 2x/week  OT DURATION: 12 weeks( anticipate d/c after 8 weeks dependent on progress)  PLANNED INTERVENTIONS: 02831 OT Re-evaluation, 97535 self care/ADL training, 02889 therapeutic exercise, 97530 therapeutic activity, 97112 neuromuscular re-education, 97140 manual therapy, 97113 aquatic therapy, 97035 ultrasound, 97018 paraffin, 02960 fluidotherapy, 97010 moist heat, 97010 cryotherapy, 97034 contrast bath, 97760 Orthotics management and training, 02239 Splinting (initial encounter), [id number] Subsequent splinting/medication, passive range of motion, energy conservation, coping strategies training, patient/family education, and DME and/or AE instructions  RECOMMENDED OTHER SERVICES: none  CONSULTED AND AGREED WITH PLAN OF CARE: Patient  PLAN FOR NEXT SESSION:  check 9 hole peg test, functional use and ROM for bilateral hands   Aylana Hirschfeld, OT 10/31/2023, 3:41 PM

## 2023-10-31 NOTE — Therapy (Signed)
 OUTPATIENT PHYSICAL THERAPY SHOULDER    Patient Name: Veronica Galvan MRN: [id number] DOB:03-12-1956, 68 y.o., female Today's Date: 10/31/2023  END OF SESSION:  PT End of Session - 10/31/23 1528     Visit Number 78    Date for PT Re-Evaluation 11/02/23    Authorization Type [employer] [place]    PT Start Time 1328    PT Stop Time 1430    PT Time Calculation (min) 62 min    Activity Tolerance Patient tolerated treatment well    Behavior During Therapy WFL for tasks assessed/performed          Past Medical History:  Diagnosis Date   Allergy     Anxiety    Arthritis    hands   Cataract    Diabetes mellitus without complication (HCC)    type 2   Family history of adverse reaction to anesthesia    son has malignant hyperthermia, daughter does not daughter recently had c section without problems   GERD (gastroesophageal reflux disease)    Headache    sinus   Hyperlipidemia    Hypertension    PONV (postoperative nausea and vomiting)    nausea only   Ulcer, stomach peptic yrs ago   Past Surgical History:  Procedure Laterality Date   APPENDECTOMY     both hells bone spur repair     both heels with metal clips   both shoulder rotator cuff repair     CESAREAN SECTION     x 1   COLONOSCOPY WITH PROPOFOL  N/A 09/07/2016   Procedure: COLONOSCOPY WITH PROPOFOL ;  Surgeon: Vicci Gladis POUR, MD;  Location: [facility];  Service: Endoscopy;  Laterality: N/A;   colonscopy  06/2011   polyps   ELBOW FRACTURE SURGERY Left    EYE SURGERY     FRACTURE SURGERY     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/10/2022   Procedure: REVERSE SHOULDER ARTHROPLASTY;  Surgeon: Melita Drivers, MD;  Location: [facility];  Service: Orthopedics;  Laterality: Right;  Please follow in room 6 if able   TUBAL LIGATION     VESICO-VAGINAL FISTULA REPAIR     Patient Active Problem List   Diagnosis Date Noted   Gastroesophageal reflux disease 11/16/2021   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 08/18/2021   Dermatofibroma 08/18/2021   History of malignant neoplasm of skin 08/18/2021   Lentigo 08/18/2021   Melanocytic nevi of trunk 08/18/2021   Nevus lipomatosus cutaneous superficialis 08/18/2021   Rosacea 08/18/2021   Actinic keratosis 08/18/2021   Sensorineural hearing loss (SNHL) of left ear with unrestricted hearing of right ear 07/26/2021   Tinnitus of left ear 07/26/2021   Acute recurrent maxillary sinusitis 06/22/2021   Primary hypertension 01/12/2021   Hyperlipidemia associated with type 2 diabetes mellitus (HCC) 01/12/2021   Arthritis 01/12/2021   Type II diabetes mellitus (HCC) 11/25/2019   Ingrown toenail 09/05/2018   Neck pain 01/29/2018   Tick bite 10/24/2017    PCP: [name], [name]  REFERRING PROVIDER: Melita, MD  REFERRING DIAG: s/p right reverse TSA  THERAPY DIAG:  Muscle weakness (generalized)  Chronic right shoulder pain  Stiffness of right shoulder, not elsewhere classified  Cervicalgia  Spasm  Rationale for Evaluation and Treatment: Rehabilitation  ONSET DATE: 11/05/22  SUBJECTIVE:  SUBJECTIVE STATEMENT:   Patient reports sore left shoulder and right shoulder  Patient reports that she saw the surgeon she is frustrated and upset as she feels that she still has no answers, it is felt that there is no loosening and no infection.  She is still hurting with basic ADL's, her AROM decreased beginning in March, as she started having increased pain in mid January after a very busy and active few month, she has an order for the left shoulder and the okay for us  to see her for her right shoulder for another month PAIN:  Are you having pain? Yes: NPRS scale: 5/10 Pain location: right shoulder, HA Pain description: dull ache at rest, sharp with motions Aggravating  factors: quick motions, movements pain up to 10/10 Relieving factors: ice, rest, Tylenol  at best 3/10  PRECAUTIONS: Shoulder Protocol in the chart  RED FLAGS: None   WEIGHT BEARING RESTRICTIONS: No  FALLS:  Has patient fallen in last 6 months? Yes. Number of falls 1  LIVING ENVIRONMENT: Lives with: lives alone Lives in: House/apartment Stairs: No Has following equipment at home: None  OCCUPATION: retired  PLOF: Independent  PATIENT GOALS:dress without difficulty, do hair, have good ROM and less pain  NEXT MD VISIT:   OBJECTIVE:   DIAGNOSTIC FINDINGS:  See above  PATIENT SURVEYS:  FOTO 21  COGNITION: Overall cognitive status: Within functional limits for tasks assessed     SENSATION: WFL  POSTURE: Fwd head, rounded shoulders, elevated and guarded shoulder  UPPER EXTREMITY ROM:   Active ROM Right PROM eval Left AROM  08/31/23 PROM Supine 01/05/23 PROM  01/26/23 PROM 02/09/23 AROM sitting 02/14/23 AROM 02/21/23 AROM  03/02/23 AROM  03/22/23 AAROM 03/28/23 AROM Standing 04/13/23 AROM Standing 05/02/23 AROM  Standing 05/11/23 AROM  Standing 05/25/23 AROM Standing 06/01/23 AROM Standing  07/06/23 AROM  Standing 07/27/23 AROM Standing 08/31/23 AROM Standing  10/03/23 AROM 10/19/23  Shoulder flexion 35 90 118  123 90 96 95 100 111 110 112 120 125  129 132 130 105 115 110  Shoulder extension 5                     Shoulder abduction 20 95 90 92  73 80 83 90 93 90 92 98 100 102  105 95 81 90   Shoulder adduction                      Shoulder internal rotation 15 35            40 40  42 25    Shoulder external rotation 20 65 45 30  41  45 50 53 56 56 60 66 67 70 70  47 60   Elbow flexion 120                     Elbow extension 5                     Wrist flexion                      Wrist extension                      Wrist ulnar deviation                      Wrist radial deviation  Wrist pronation                      Wrist supination                       (Blank rows = not tested)  UPPER EXTREMITY MMT:  No tested due to recent surgery  MMT Right eval Left eval  Shoulder flexion    Shoulder extension    Shoulder abduction    Shoulder adduction    Shoulder internal rotation    Shoulder external rotation    Middle trapezius    Lower trapezius    Elbow flexion    Elbow extension    Wrist flexion    Wrist extension    Wrist ulnar deviation    Wrist radial deviation    Wrist pronation    Wrist supination    Grip strength (lbs)    (Blank rows = not tested)  PALPATION:  Very tight and tender in the pectoral, upper trap, the entire right upper arm   TODAY'S TREATMENT:                                                                                                                                         DATE:  10/31/23 STM to the left UE Passive stretch to bilateral UE's all motions some holds and some contract relax, needs a lot of cues to relax, slow focused stretches today Vaso to bilateral shoulders  10/26/23 UBE 5 minutes Red tand row and extension Ball roll ups Ball vs wall 2 different positions  20# triceps STM to the left shoulder and neck Some passive stretch to bilateral pecs and biceps Vaso low pressure 34 degrees  10/25/23 UBE level 2 x 4 minutes 10# row 2x10 5# biceps  15# triceps Red tband IR Yellow tband ER Supine 2# punches with PT limiting the ROM Supine 2# serratus 2# isometric circles 1# small ROM ER/IR supine Gentle PROM avoiding pain Vaso 34 degrees low pressure  10/19/23 Discussion of the plan the surgeon gave her and the plan that I feel we will do, we measured the ROM and looked at anatomy and what the MD notes say as she had a lot of questions. See plan below Some STM to the right upper trap  10/17/23 Gentle stretch of the neck and shoulders STM to the bilateral upper traps, rhomboids, teres and right pectoral mms Vaso to the right shoulder 35 degrees medium  pressure  10/12/23 UBE level 4 x 6 minutes Passive stretch to bilateral shoulders STM to the left shoulder/upper arm STM to the right upper trap, rhomboid, neck and teres. Vaso medium pressure 34 degrees  10/10/23 UBE level 3 x 6 minutes PROM right shoulder to her tolerance all motions STM to the right upper trap, neck and rhomboid Vaso right shoulder medium pressure 34 degrees  10/05/23 UBE  level 4 x 5 minutes Supine chest presses Supine flexion with 2# wate bar Passive stretch gentle right shoulder STM to the right upper trap and neck Vaso medium pressure  10/03/23 UBE level 3 x 6 minutes Supine wand chest press Supine wand flexion Passive stretch right shoulder  STM to the right upper arm and pectoral area Vaso 34 degrees right shoulder ROM measured as above PATIENT EDUCATION: Education details: poc/hep Person educated: Patient Education method: Programmer, multimedia, Facilities manager, Actor cues, Verbal cues, and Handouts Education comprehension: verbalized understanding  HOME EXERCISE PROGRAM: Access Code: [email or web address] URL: [email or web address] Date: 12/20/2022 Prepared by: Ozell Mainland  Exercises - Seated Shoulder Flexion Towel Slide at Table Top  - 2 x daily - 7 x weekly - 2 sets - 10 reps - 3 hold - Seated Shoulder External Rotation PROM on Table  - 2 x daily - 7 x weekly - 2 sets - 10 reps - 3 hold - Seated Elbow Extension and Shoulder External Rotation AAROM at Table with Towel  - 2 x daily - 7 x weekly - 2 sets - 10 reps - 3 hold - Circular Shoulder Pendulum with Table Support  - 2 x daily - 7 x weekly - 2 sets - 10 reps - 3 hold  Access Code: [email or web address] URL: [email or web address] Date: 07/13/2023 Prepared by: Ozell Mainland  Exercises - Isometric Shoulder Flexion at Wall  - 1 x daily - 7 x weekly - 1 sets - 10 reps - 3 hold - Standing Isometric Shoulder Internal Rotation at Doorway  - 1 x daily - 7 x weekly - 1 sets - 10 reps - 3 hold -  Isometric Shoulder Extension at Wall  - 1 x daily - 7 x weekly - 1 sets - 10 reps - 3 hold - Isometric Shoulder Abduction at Wall  - 1 x daily - 7 x weekly - 1 sets - 10 reps - 3 hold - Standing Isometric Shoulder External Rotation with Doorway  - 1 x daily - 7 x weekly - 1 sets - 10 reps - 3 hold  ASSESSMENT:  CLINICAL IMPRESSION:  Had some increased left upper arm pain today, worked on this very tender in certain areas. I did some really focused and functional stretches of the shoulders so she could be able to reach the hair and back of her neck, this was difficult to get to but was able to do, she was a little sore after and we did this.    Patient saw surgeon, the bone scan said did not feel that there was infection or loosening however the surgeon is not so sure, he wants to do an US  to be sure, that is scheduled for the next few weeks, since she has not been to PT in a month I re-measured her ROM, she has lost at least 15 degrees of motion and more for all motions.  She is also having more pain in the left shoulder possibly from overdoing it, because she is trying to not use the right shoulder.  She has talked about this with her primary and I suggested that she see a sports medicine MD, she called while she was here and will have appointment with him next week.  As far as the right shoulder the Surgeon stated in his notes to resume formal PT on a conservative basis to help with ROM and pain until the US  is performed   OBJECTIVE IMPAIRMENTS: cardiopulmonary status limiting activity, decreased activity tolerance, decreased endurance, decreased ROM, decreased  strength, increased edema, increased muscle spasms, impaired flexibility, impaired UE functional use, improper body mechanics, postural dysfunction, and pain.  REHAB POTENTIAL: Good  CLINICAL DECISION MAKING: Evolving/moderate complexity  EVALUATION COMPLEXITY: Low   GOALS: Goals reviewed with patient? Yes  SHORT TERM GOALS: Target  date: 01/01/23  Independent with initial HEP Goal status: 12/27/22 MET  LONG TERM GOALS: Target date: 03/22/23  Decrease pain 50% Goal status: progressing 10/03/23  2.  Dress without difficulty Goal status: progressing  10/26/23  3.  Do hair without difficulty Goal status: able to reach the top of head at times progressing 2/20/253/25/25 4.  Increase AROM right shoulder flexion to 130 degrees Goal status: 07/25/23 progressing 130 degrees with pain, since March has regressed but is better since march6/3/25  5.  Increase right shoulder ER to 60 degrees Goal status: met 07/06/23  6.  Return to water  aerobics and or gym activity Goal status:progressing met 06/01/23  PLAN:  PT FREQUENCY: 1-2x/week  PT DURATION: 12 weeks  PLANNED INTERVENTIONS: Therapeutic exercises, Therapeutic activity, Neuromuscular re-education, Balance training, Gait training, Patient/Family education, Self Care, Joint mobilization, Dry Needling, Electrical stimulation, Cryotherapy, Vasopneumatic device, and Manual therapy  PLAN FOR NEXT SESSION:   See patient over the next month, will look to D/C at the end of July, and then have her take time off and see how she does will do renewal next visit 10/31/2023, 3:29 PM [facility] [address] [facility] [place], [place], [address] Phone: [phone]   Fax:  [phone]

## 2023-11-01 ENCOUNTER — Encounter: Payer: Self-pay | Admitting: Physical Therapy

## 2023-11-01 ENCOUNTER — Ambulatory Visit: Admitting: Physical Therapy

## 2023-11-01 DIAGNOSIS — R252 Cramp and spasm: Secondary | ICD-10-CM | POA: Diagnosis not present

## 2023-11-01 DIAGNOSIS — M6281 Muscle weakness (generalized): Secondary | ICD-10-CM | POA: Diagnosis not present

## 2023-11-01 DIAGNOSIS — M25511 Pain in right shoulder: Secondary | ICD-10-CM | POA: Diagnosis not present

## 2023-11-01 DIAGNOSIS — M542 Cervicalgia: Secondary | ICD-10-CM

## 2023-11-01 DIAGNOSIS — R278 Other lack of coordination: Secondary | ICD-10-CM | POA: Diagnosis not present

## 2023-11-01 DIAGNOSIS — M25642 Stiffness of left hand, not elsewhere classified: Secondary | ICD-10-CM | POA: Diagnosis not present

## 2023-11-01 DIAGNOSIS — M79641 Pain in right hand: Secondary | ICD-10-CM | POA: Diagnosis not present

## 2023-11-01 DIAGNOSIS — M25611 Stiffness of right shoulder, not elsewhere classified: Secondary | ICD-10-CM | POA: Diagnosis not present

## 2023-11-01 DIAGNOSIS — M79642 Pain in left hand: Secondary | ICD-10-CM | POA: Diagnosis not present

## 2023-11-01 DIAGNOSIS — M25641 Stiffness of right hand, not elsewhere classified: Secondary | ICD-10-CM | POA: Diagnosis not present

## 2023-11-01 DIAGNOSIS — G8929 Other chronic pain: Secondary | ICD-10-CM | POA: Diagnosis not present

## 2023-11-01 NOTE — Therapy (Signed)
 OUTPATIENT PHYSICAL THERAPY SHOULDER    Patient Name: Veronica Galvan MRN: [id number] DOB:[birth year], 68 y.o., female Today's Date: 11/01/2023  END OF SESSION:  PT End of Session - 11/01/23 0929     Visit Number 79    Date for PT Re-Evaluation 12/07/23    Authorization Type [employer] [place]    PT Start Time [phone]    PT Stop Time 1030    PT Time Calculation (min) 61 min    Activity Tolerance Patient tolerated treatment well    Behavior During Therapy WFL for tasks assessed/performed          Past Medical History:  Diagnosis Date   Allergy     Anxiety    Arthritis    hands   Cataract    Diabetes mellitus without complication (HCC)    type 2   Family history of adverse reaction to anesthesia    son has malignant hyperthermia, daughter does not daughter recently had c section without problems   GERD (gastroesophageal reflux disease)    Headache    sinus   Hyperlipidemia    Hypertension    PONV (postoperative nausea and vomiting)    nausea only   Ulcer, stomach peptic yrs ago   Past Surgical History:  Procedure Laterality Date   APPENDECTOMY     both hells bone spur repair     both heels with metal clips   both shoulder rotator cuff repair     CESAREAN SECTION     x 1   COLONOSCOPY WITH PROPOFOL  N/A 09/07/2016   Procedure: COLONOSCOPY WITH PROPOFOL ;  Surgeon: Vicci Gladis POUR, MD;  Location: [facility];  Service: Endoscopy;  Laterality: N/A;   colonscopy  06/2011   polyps   ELBOW FRACTURE SURGERY Left    EYE SURGERY     FRACTURE SURGERY     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/10/2022   Procedure: REVERSE SHOULDER ARTHROPLASTY;  Surgeon: Melita Drivers, MD;  Location: [facility];  Service: Orthopedics;  Laterality: Right;  Please follow in room 6 if able   TUBAL LIGATION     VESICO-VAGINAL FISTULA REPAIR     Patient Active Problem List   Diagnosis Date Noted   Gastroesophageal reflux disease 11/16/2021   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 08/18/2021   Dermatofibroma 08/18/2021   History of malignant neoplasm of skin 08/18/2021   Lentigo 08/18/2021   Melanocytic nevi of trunk 08/18/2021   Nevus lipomatosus cutaneous superficialis 08/18/2021   Rosacea 08/18/2021   Actinic keratosis 08/18/2021   Sensorineural hearing loss (SNHL) of left ear with unrestricted hearing of right ear 07/26/2021   Tinnitus of left ear 07/26/2021   Acute recurrent maxillary sinusitis 06/22/2021   Primary hypertension 01/12/2021   Hyperlipidemia associated with type 2 diabetes mellitus (HCC) 01/12/2021   Arthritis 01/12/2021   Type II diabetes mellitus (HCC) 11/25/2019   Ingrown toenail 09/05/2018   Neck pain 01/29/2018   Tick bite 10/24/2017    PCP: [name], [name]  REFERRING PROVIDER: Melita, MD  REFERRING DIAG: s/p right reverse TSA  THERAPY DIAG:  Muscle weakness (generalized)  Chronic right shoulder pain  Stiffness of right shoulder, not elsewhere classified  Cervicalgia  Spasm  Rationale for Evaluation and Treatment: Rehabilitation  ONSET DATE: 11/05/22  SUBJECTIVE:  SUBJECTIVE STATEMENT:   I was really sore after the last visit. Both shoulders  Patient reports that she saw the surgeon she is frustrated and upset as she feels that she still has no answers, it is felt that there is no loosening and no infection.  She is still hurting with basic ADL's, her AROM decreased beginning in March, as she started having increased pain in mid January after a very busy and active few month, she has an order for the left shoulder and the okay for us  to see her for her right shoulder for another month PAIN:  Are you having pain? Yes: NPRS scale: 5/10 Pain location: right shoulder, HA Pain description: dull ache at rest, sharp with motions Aggravating  factors: quick motions, movements pain up to 10/10 Relieving factors: ice, rest, Tylenol  at best 3/10  PRECAUTIONS: Shoulder Protocol in the chart  RED FLAGS: None   WEIGHT BEARING RESTRICTIONS: No  FALLS:  Has patient fallen in last 6 months? Yes. Number of falls 1  LIVING ENVIRONMENT: Lives with: lives alone Lives in: House/apartment Stairs: No Has following equipment at home: None  OCCUPATION: retired  PLOF: Independent  PATIENT GOALS:dress without difficulty, do hair, have good ROM and less pain  NEXT MD VISIT:   OBJECTIVE:   DIAGNOSTIC FINDINGS:  See above  PATIENT SURVEYS:  FOTO 21  COGNITION: Overall cognitive status: Within functional limits for tasks assessed     SENSATION: WFL  POSTURE: Fwd head, rounded shoulders, elevated and guarded shoulder  UPPER EXTREMITY ROM:   Active ROM Right PROM eval Left AROM  08/31/23 PROM Supine 01/05/23 PROM  01/26/23 PROM 02/09/23 AROM sitting 02/14/23 AROM 02/21/23 AROM  03/02/23 AROM  03/22/23 AAROM 03/28/23 AROM Standing 04/13/23 AROM Standing 05/02/23 AROM  Standing 05/11/23 AROM  Standing 05/25/23 AROM Standing 06/01/23 AROM Standing  07/06/23 AROM  Standing 07/27/23 AROM Standing 08/31/23 AROM Standing  10/03/23 AROM 10/19/23 AROM 11/01/23  Shoulder flexion 35 90 118  123 90 96 95 100 111 110 112 120 125  129 132 130 105 115 110 R 120 L 135  Shoulder extension 5                      Shoulder abduction 20 95 90 92  73 80 83 90 93 90 92 98 100 102  105 95 81 90  R 97 L 130  Shoulder adduction                       Shoulder internal rotation 15 35            40 40  42 25   R 28 L32  Shoulder external rotation 20 65 45 30  41  45 50 53 56 56 60 66 67 70 70  47 60  R 45 L 75  Elbow flexion 120                      Elbow extension 5                      Wrist flexion                       Wrist extension                       Wrist ulnar deviation  Wrist radial deviation                        Wrist pronation                       Wrist supination                       (Blank rows = not tested)  UPPER EXTREMITY MMT:  No tested due to recent surgery  MMT Right eval Left eval  Shoulder flexion    Shoulder extension    Shoulder abduction    Shoulder adduction    Shoulder internal rotation    Shoulder external rotation    Middle trapezius    Lower trapezius    Elbow flexion    Elbow extension    Wrist flexion    Wrist extension    Wrist ulnar deviation    Wrist radial deviation    Wrist pronation    Wrist supination    Grip strength (lbs)    (Blank rows = not tested)  PALPATION:  Very tight and tender in the pectoral, upper trap, the entire right upper arm   TODAY'S TREATMENT:                                                                                                                                         DATE:  11/01/23 Niustep level 4 x 4 minutes 10# seated row Chest press 5# Wall slides/circles STM to the right upper trap and neck Ktape X over large trigger point right upper trap and then two I strips to support shoulder Vaso low pressure 34 degrees  10/31/23 STM to the left UE Passive stretch to bilateral UE's all motions some holds and some contract relax, needs a lot of cues to relax, slow focused stretches today Vaso to bilateral shoulders  10/26/23 UBE 5 minutes Red tand row and extension Ball roll ups Ball vs wall 2 different positions  20# triceps STM to the left shoulder and neck Some passive stretch to bilateral pecs and biceps Vaso low pressure 34 degrees  10/25/23 UBE level 2 x 4 minutes 10# row 2x10 5# biceps  15# triceps Red tband IR Yellow tband ER Supine 2# punches with PT limiting the ROM Supine 2# serratus 2# isometric circles 1# small ROM ER/IR supine Gentle PROM avoiding pain Vaso 34 degrees low pressure  10/19/23 Discussion of the plan the surgeon gave her and the plan that I feel we will do, we measured the  ROM and looked at anatomy and what the MD notes say as she had a lot of questions. See plan below Some STM to the right upper trap  10/17/23 Gentle stretch of the neck and shoulders STM to the bilateral upper traps, rhomboids, teres and right pectoral mms Vaso to the right shoulder  35 degrees medium pressure  10/12/23 UBE level 4 x 6 minutes Passive stretch to bilateral shoulders STM to the left shoulder/upper arm STM to the right upper trap, rhomboid, neck and teres. Vaso medium pressure 34 degrees  10/10/23 UBE level 3 x 6 minutes PROM right shoulder to her tolerance all motions STM to the right upper trap, neck and rhomboid Vaso right shoulder medium pressure 34 degrees  10/05/23 UBE level 4 x 5 minutes Supine chest presses Supine flexion with 2# wate bar Passive stretch gentle right shoulder STM to the right upper trap and neck Vaso medium pressure  10/03/23 UBE level 3 x 6 minutes Supine wand chest press Supine wand flexion Passive stretch right shoulder  STM to the right upper arm and pectoral area Vaso 34 degrees right shoulder ROM measured as above PATIENT EDUCATION: Education details: poc/hep Person educated: Patient Education method: Programmer, multimedia, Facilities manager, Actor cues, Verbal cues, and Handouts Education comprehension: verbalized understanding  HOME EXERCISE PROGRAM: Access Code: [email or web address] URL: [email or web address] Date: 12/20/2022 Prepared by: Ozell Mainland  Exercises - Seated Shoulder Flexion Towel Slide at Table Top  - 2 x daily - 7 x weekly - 2 sets - 10 reps - 3 hold - Seated Shoulder External Rotation PROM on Table  - 2 x daily - 7 x weekly - 2 sets - 10 reps - 3 hold - Seated Elbow Extension and Shoulder External Rotation AAROM at Table with Towel  - 2 x daily - 7 x weekly - 2 sets - 10 reps - 3 hold - Circular Shoulder Pendulum with Table Support  - 2 x daily - 7 x weekly - 2 sets - 10 reps - 3 hold  Access Code: [email or web address] URL:  [email or web address] Date: 07/13/2023 Prepared by: Ozell Mainland  Exercises - Isometric Shoulder Flexion at Wall  - 1 x daily - 7 x weekly - 1 sets - 10 reps - 3 hold - Standing Isometric Shoulder Internal Rotation at Doorway  - 1 x daily - 7 x weekly - 1 sets - 10 reps - 3 hold - Isometric Shoulder Extension at Wall  - 1 x daily - 7 x weekly - 1 sets - 10 reps - 3 hold - Isometric Shoulder Abduction at Wall  - 1 x daily - 7 x weekly - 1 sets - 10 reps - 3 hold - Standing Isometric Shoulder External Rotation with Doorway  - 1 x daily - 7 x weekly - 1 sets - 10 reps - 3 hold  ASSESSMENT:  CLINICAL IMPRESSION:  Patient very sore after me stretching with her yesterday, during exercises she report less pain with me holding the right upper arm, so I used Ktape to see if this would help support the arm and also help a trigger point, I did measure both shoulder ROM today and she has made some good progress lately but still with pain and soreness.  We are slowly trying to return to light exercises  Patient saw surgeon, the bone scan said did not feel that there was infection or loosening however the surgeon is not so sure, he wants to do an US  to be sure, that is scheduled for the next few weeks, since she has not been to PT in a month I re-measured her ROM, she has lost at least 15 degrees of motion and more for all motions.  She is also having more pain in the left shoulder possibly from overdoing it, because she is trying to not use the right shoulder.  She has talked about this with her primary and I suggested that she see a sports medicine MD, she called while she was here and will have appointment with him next week.  As far as the right shoulder the Surgeon stated in his notes to resume formal PT on a conservative basis to help with ROM and pain until the US  is performed   OBJECTIVE IMPAIRMENTS: cardiopulmonary status limiting activity, decreased activity tolerance, decreased  endurance, decreased ROM, decreased strength, increased edema, increased muscle spasms, impaired flexibility, impaired UE functional use, improper body mechanics, postural dysfunction, and pain.  REHAB POTENTIAL: Good  CLINICAL DECISION MAKING: Evolving/moderate complexity  EVALUATION COMPLEXITY: Low   GOALS: Goals reviewed with patient? Yes  SHORT TERM GOALS: Target date: 01/01/23  Independent with initial HEP Goal status: 12/27/22 MET  LONG TERM GOALS: Target date: 03/22/23  Decrease pain 50% Goal status: progressing 11/01/23  2.  Dress without difficulty Goal status: progressing  11/01/23  3.  Do hair without difficulty Goal status: able to reach the top of head at times progressing 2/20/253/25/25 4.  Increase AROM right shoulder flexion to 130 degrees Goal status: 07/25/23 progressing 130 degrees with pain, since March has regressed but is better since march 11/01/23  5.  Increase right shoulder ER to 60 degrees Goal status: met 07/06/23  6.  Return to water  aerobics and or gym activity Goal status:progressing met 06/01/23  PLAN:  PT FREQUENCY: 1-2x/week  PT DURATION: 12 weeks  PLANNED INTERVENTIONS: Therapeutic exercises, Therapeutic activity, Neuromuscular re-education, Balance training, Gait training, Patient/Family education, Self Care, Joint mobilization, Dry Needling, Electrical stimulation, Cryotherapy, Vasopneumatic device, and Manual therapy  PLAN FOR NEXT SESSION:   See patient over the next month, will look to D/C at the end of July, and then have her take time off and see how she does , did renewal today, slow easy exercisres 11/01/2023, 9:32 AM [facility]. [place], [place], [address] Phone: [phone]   Fax:  [phone]

## 2023-11-07 ENCOUNTER — Ambulatory Visit: Admitting: Occupational Therapy

## 2023-11-07 ENCOUNTER — Ambulatory Visit: Admitting: Physical Therapy

## 2023-11-07 ENCOUNTER — Encounter: Payer: Self-pay | Admitting: Family Medicine

## 2023-11-07 DIAGNOSIS — M6281 Muscle weakness (generalized): Secondary | ICD-10-CM

## 2023-11-07 DIAGNOSIS — R252 Cramp and spasm: Secondary | ICD-10-CM | POA: Diagnosis not present

## 2023-11-07 DIAGNOSIS — M25611 Stiffness of right shoulder, not elsewhere classified: Secondary | ICD-10-CM

## 2023-11-07 DIAGNOSIS — M25642 Stiffness of left hand, not elsewhere classified: Secondary | ICD-10-CM | POA: Diagnosis not present

## 2023-11-07 DIAGNOSIS — R278 Other lack of coordination: Secondary | ICD-10-CM

## 2023-11-07 DIAGNOSIS — G8929 Other chronic pain: Secondary | ICD-10-CM | POA: Diagnosis not present

## 2023-11-07 DIAGNOSIS — M79642 Pain in left hand: Secondary | ICD-10-CM

## 2023-11-07 DIAGNOSIS — M542 Cervicalgia: Secondary | ICD-10-CM | POA: Diagnosis not present

## 2023-11-07 DIAGNOSIS — M79641 Pain in right hand: Secondary | ICD-10-CM

## 2023-11-07 DIAGNOSIS — M25641 Stiffness of right hand, not elsewhere classified: Secondary | ICD-10-CM | POA: Diagnosis not present

## 2023-11-07 DIAGNOSIS — M25511 Pain in right shoulder: Secondary | ICD-10-CM | POA: Diagnosis not present

## 2023-11-07 NOTE — Therapy (Signed)
 OUTPATIENT PHYSICAL THERAPY SHOULDER    Patient Name: Veronica Galvan MRN: [id number] DOB:1956-01-04, 68 y.o., female Today's Date: 11/07/2023  END OF SESSION:  PT End of Session - 11/07/23 0754     Visit Number 80    Date for PT Re-Evaluation 12/07/23    Authorization Type [employer] [place]    PT Start Time 0800    PT Stop Time 0850    PT Time Calculation (min) 50 min          Past Medical History:  Diagnosis Date   Allergy     Anxiety    Arthritis    hands   Cataract    Diabetes mellitus without complication (HCC)    type 2   Family history of adverse reaction to anesthesia    son has malignant hyperthermia, daughter does not daughter recently had c section without problems   GERD (gastroesophageal reflux disease)    Headache    sinus   Hyperlipidemia    Hypertension    PONV (postoperative nausea and vomiting)    nausea only   Ulcer, stomach peptic yrs ago   Past Surgical History:  Procedure Laterality Date   APPENDECTOMY     both hells bone spur repair     both heels with metal clips   both shoulder rotator cuff repair     CESAREAN SECTION     x 1   COLONOSCOPY WITH PROPOFOL  N/A 09/07/2016   Procedure: COLONOSCOPY WITH PROPOFOL ;  Surgeon: Vicci Gladis POUR, MD;  Location: [facility];  Service: Endoscopy;  Laterality: N/A;   colonscopy  06/2011   polyps   ELBOW FRACTURE SURGERY Left    EYE SURGERY     FRACTURE SURGERY     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/10/2022   Procedure: REVERSE SHOULDER ARTHROPLASTY;  Surgeon: Melita Drivers, MD;  Location: [facility];  Service: Orthopedics;  Laterality: Right;  Please follow in room 6 if able   TUBAL LIGATION     VESICO-VAGINAL FISTULA REPAIR     Patient Active Problem List   Diagnosis Date Noted   Gastroesophageal reflux disease 11/16/2021   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 08/18/2021   Dermatofibroma 08/18/2021   History of malignant neoplasm of skin 08/18/2021   Lentigo  08/18/2021   Melanocytic nevi of trunk 08/18/2021   Nevus lipomatosus cutaneous superficialis 08/18/2021   Rosacea 08/18/2021   Actinic keratosis 08/18/2021   Sensorineural hearing loss (SNHL) of left ear with unrestricted hearing of right ear 07/26/2021   Tinnitus of left ear 07/26/2021   Acute recurrent maxillary sinusitis 06/22/2021   Primary hypertension 01/12/2021   Hyperlipidemia associated with type 2 diabetes mellitus (HCC) 01/12/2021   Arthritis 01/12/2021   Type II diabetes mellitus (HCC) 11/25/2019   Ingrown toenail 09/05/2018   Neck pain 01/29/2018   Tick bite 10/24/2017    PCP: [name], [name]  REFERRING PROVIDER: Melita, MD  REFERRING DIAG: s/p right reverse TSA  THERAPY DIAG:  Muscle weakness (generalized)  Chronic right shoulder pain  Stiffness of right shoulder, not elsewhere classified  Spasm  Rationale for Evaluation and Treatment: Rehabilitation  ONSET DATE: 11/05/22  SUBJECTIVE:  SUBJECTIVE STATEMENT:  rather you work on left shld and change tape on RT. 3 more weeks until I can get another shot   Patient reports that she saw the surgeon she is frustrated and upset as she feels that she still has no answers, it is felt that there is no loosening and no infection.  She is still hurting with basic ADL's, her AROM decreased beginning in March, as she started having increased pain in mid January after a very busy and active few month, she has an order for the left shoulder and the okay for us  to see her for her right shoulder for another month PAIN:  Are you having pain? Yes: NPRS scale: 5/10 Pain location: right shoulder, HA Pain description: dull ache at rest, sharp with motions Aggravating factors: quick motions, movements pain up to 10/10 Relieving factors: ice, rest, Tylenol  at  best 3/10  PRECAUTIONS: Shoulder Protocol in the chart  RED FLAGS: None   WEIGHT BEARING RESTRICTIONS: No  FALLS:  Has patient fallen in last 6 months? Yes. Number of falls 1  LIVING ENVIRONMENT: Lives with: lives alone Lives in: House/apartment Stairs: No Has following equipment at home: None  OCCUPATION: retired  PLOF: Independent  PATIENT GOALS:dress without difficulty, do hair, have good ROM and less pain  NEXT MD VISIT:   OBJECTIVE:   DIAGNOSTIC FINDINGS:  See above  PATIENT SURVEYS:  FOTO 21  COGNITION: Overall cognitive status: Within functional limits for tasks assessed     SENSATION: WFL  POSTURE: Fwd head, rounded shoulders, elevated and guarded shoulder  UPPER EXTREMITY ROM:   Active ROM Right PROM eval Left AROM  08/31/23 PROM Supine 01/05/23 PROM  01/26/23 PROM 02/09/23 AROM sitting 02/14/23 AROM 02/21/23 AROM  03/02/23 AROM  03/22/23 AAROM 03/28/23 AROM Standing 04/13/23 AROM Standing 05/02/23 AROM  Standing 05/11/23 AROM  Standing 05/25/23 AROM Standing 06/01/23 AROM Standing  07/06/23 AROM  Standing 07/27/23 AROM Standing 08/31/23 AROM Standing  10/03/23 AROM 10/19/23 AROM 11/01/23  Shoulder flexion 35 90 118  123 90 96 95 100 111 110 112 120 125  129 132 130 105 115 110 R 120 L 135  Shoulder extension 5                      Shoulder abduction 20 95 90 92  73 80 83 90 93 90 92 98 100 102  105 95 81 90  R 97 L 130  Shoulder adduction                       Shoulder internal rotation 15 35            40 40  42 25   R 28 L32  Shoulder external rotation 20 65 45 30  41  45 50 53 56 56 60 66 67 70 70  47 60  R 45 L 75  Elbow flexion 120                      Elbow extension 5                      Wrist flexion                       Wrist extension                       Wrist ulnar deviation  Wrist radial deviation                       Wrist pronation                       Wrist supination                       (Blank  rows = not tested)  UPPER EXTREMITY MMT:  No tested due to recent surgery  MMT Right eval Left eval  Shoulder flexion    Shoulder extension    Shoulder abduction    Shoulder adduction    Shoulder internal rotation    Shoulder external rotation    Middle trapezius    Lower trapezius    Elbow flexion    Elbow extension    Wrist flexion    Wrist extension    Wrist ulnar deviation    Wrist radial deviation    Wrist pronation    Wrist supination    Grip strength (lbs)    (Blank rows = not tested)  PALPATION:  Very tight and tender in the pectoral, upper trap, the entire right upper arm   TODAY'S TREATMENT:                                                                                                                                         DATE:   11/07/23 Nustep L 4 15# seated row 2 sets 10 Chest Press 5# 2 sets 10 US  left shld STM to the left upper trap and neck. Left shld gentle PROM Ktape X over large trigger point right upper trap and then two I strips to support shoulder Vaso low pressure 34 degrees  11/01/23 Niustep level 4 x 4 minutes 10# seated row Chest press 5# Wall slides/circles STM to the right upper trap and neck Ktape X over large trigger point right upper trap and then two I strips to support shoulder Vaso low pressure 34 degrees  10/31/23 STM to the left UE Passive stretch to bilateral UE's all motions some holds and some contract relax, needs a lot of cues to relax, slow focused stretches today Vaso to bilateral shoulders  10/26/23 UBE 5 minutes Red tand row and extension Ball roll ups Ball vs wall 2 different positions  20# triceps STM to the left shoulder and neck Some passive stretch to bilateral pecs and biceps Vaso low pressure 34 degrees  10/25/23 UBE level 2 x 4 minutes 10# row 2x10 5# biceps  15# triceps Red tband IR Yellow tband ER Supine 2# punches with PT limiting the ROM Supine 2# serratus 2# isometric  circles 1# small ROM ER/IR supine Gentle PROM avoiding pain Vaso 34 degrees low pressure  10/19/23 Discussion of the plan the surgeon gave her and the plan that I feel we will do, we  measured the ROM and looked at anatomy and what the MD notes say as she had a lot of questions. See plan below Some STM to the right upper trap  10/17/23 Gentle stretch of the neck and shoulders STM to the bilateral upper traps, rhomboids, teres and right pectoral mms Vaso to the right shoulder 35 degrees medium pressure  10/12/23 UBE level 4 x 6 minutes Passive stretch to bilateral shoulders STM to the left shoulder/upper arm STM to the right upper trap, rhomboid, neck and teres. Vaso medium pressure 34 degrees  10/10/23 UBE level 3 x 6 minutes PROM right shoulder to her tolerance all motions STM to the right upper trap, neck and rhomboid Vaso right shoulder medium pressure 34 degrees  10/05/23 UBE level 4 x 5 minutes Supine chest presses Supine flexion with 2# wate bar Passive stretch gentle right shoulder STM to the right upper trap and neck Vaso medium pressure  10/03/23 UBE level 3 x 6 minutes Supine wand chest press Supine wand flexion Passive stretch right shoulder  STM to the right upper arm and pectoral area Vaso 34 degrees right shoulder ROM measured as above PATIENT EDUCATION: Education details: poc/hep Person educated: Patient Education method: Programmer, multimedia, Facilities manager, Actor cues, Verbal cues, and Handouts Education comprehension: verbalized understanding  HOME EXERCISE PROGRAM: Access Code: [email or web address] URL: [email or web address] Date: 12/20/2022 Prepared by: Ozell Mainland  Exercises - Seated Shoulder Flexion Towel Slide at Table Top  - 2 x daily - 7 x weekly - 2 sets - 10 reps - 3 hold - Seated Shoulder External Rotation PROM on Table  - 2 x daily - 7 x weekly - 2 sets - 10 reps - 3 hold - Seated Elbow Extension and Shoulder External Rotation AAROM at  Table with Towel  - 2 x daily - 7 x weekly - 2 sets - 10 reps - 3 hold - Circular Shoulder Pendulum with Table Support  - 2 x daily - 7 x weekly - 2 sets - 10 reps - 3 hold  Access Code: [email or web address] URL: [email or web address] Date: 07/13/2023 Prepared by: Ozell Mainland  Exercises - Isometric Shoulder Flexion at Wall  - 1 x daily - 7 x weekly - 1 sets - 10 reps - 3 hold - Standing Isometric Shoulder Internal Rotation at Doorway  - 1 x daily - 7 x weekly - 1 sets - 10 reps - 3 hold - Isometric Shoulder Extension at Wall  - 1 x daily - 7 x weekly - 1 sets - 10 reps - 3 hold - Isometric Shoulder Abduction at Wall  - 1 x daily - 7 x weekly - 1 sets - 10 reps - 3 hold - Standing Isometric Shoulder External Rotation with Doorway  - 1 x daily - 7 x weekly - 1 sets - 10 reps - 3 hold  ASSESSMENT:  CLINICAL IMPRESSION:  Continue to  slowly try to return to light exercises as tolerated. Pt requested more focus on left shld this morning as it was more painful and limited ROM ,3 weeks until she can get another shot. Pt requested US  to left shld. Replaced tape on RT shld as she feels it maybe helping.  Patient saw surgeon, the bone scan said did not feel that there was infection or loosening however the surgeon is not so sure, he wants to do an US  to be sure, that is scheduled for the next few weeks, since she has not been to PT in a month I re-measured her ROM, she  has lost at least 15 degrees of motion and more for all motions.  She is also having more pain in the left shoulder possibly from overdoing it, because she is trying to not use the right shoulder.  She has talked about this with her primary and I suggested that she see a sports medicine MD, she called while she was here and will have appointment with him next week.  As far as the right shoulder the Surgeon stated in his notes to resume formal PT on a conservative basis to help with ROM and pain until the US  is performed   OBJECTIVE  IMPAIRMENTS: cardiopulmonary status limiting activity, decreased activity tolerance, decreased endurance, decreased ROM, decreased strength, increased edema, increased muscle spasms, impaired flexibility, impaired UE functional use, improper body mechanics, postural dysfunction, and pain.  REHAB POTENTIAL: Good  CLINICAL DECISION MAKING: Evolving/moderate complexity  EVALUATION COMPLEXITY: Low   GOALS: Goals reviewed with patient? Yes  SHORT TERM GOALS: Target date: 01/01/23  Independent with initial HEP Goal status: 12/27/22 MET  LONG TERM GOALS: Target date: 03/22/23  Decrease pain 50% Goal status: progressing 11/01/23  2.  Dress without difficulty Goal status: progressing  11/01/23  3.  Do hair without difficulty Goal status: able to reach the top of head at times progressing 2/20/253/25/25 4.  Increase AROM right shoulder flexion to 130 degrees Goal status: 07/25/23 progressing 130 degrees with pain, since March has regressed but is better since march 11/01/23  5.  Increase right shoulder ER to 60 degrees Goal status: met 07/06/23  6.  Return to water  aerobics and or gym activity Goal status:progressing met 06/01/23  PLAN:  PT FREQUENCY: 1-2x/week  PT DURATION: 12 weeks  PLANNED INTERVENTIONS: Therapeutic exercises, Therapeutic activity, Neuromuscular re-education, Balance training, Gait training, Patient/Family education, Self Care, Joint mobilization, Dry Needling, Electrical stimulation, Cryotherapy, Vasopneumatic device, and Manual therapy  PLAN FOR NEXT SESSION:   assess how she felt after session today with US . slow easy exercises as tolerated 11/07/2023, 7:55 AM [facility] [address] [facility] [place], [place], [address] Phone: [phone]   Fax:  [phone]

## 2023-11-07 NOTE — Therapy (Unsigned)
 OUTPATIENT OCCUPATIONAL THERAPY ORTHO Treatment  Patient Name: Veronica Galvan MRN: [id number] DOB:08/04/1955, 68 y.o., female Today's Date: 11/08/2023  PCP: Leita Eliza Elbe [name] REFERRING PROVIDER: Leita Eliza Elbe [name]  END OF SESSION:  OT End of Session - 11/08/23 1152     Visit Number 30    Number of Visits 35    Date for OT Re-Evaluation 12/13/23    Authorization Type [employer] MCR    Authorization Time Period 12 weeks    Authorization - Visit Number 30    Progress Note Due on Visit 40    OT Start Time 1403    OT Stop Time 1445    OT Time Calculation (min) 42 min    Activity Tolerance Patient tolerated treatment well    Behavior During Therapy WFL for tasks assessed/performed                          Past Medical History:  Diagnosis Date   Allergy     Anxiety    Arthritis    hands   Cataract    Diabetes mellitus without complication (HCC)    type 2   Family history of adverse reaction to anesthesia    son has malignant hyperthermia, daughter does not daughter recently had c section without problems   GERD (gastroesophageal reflux disease)    Headache    sinus   Hyperlipidemia    Hypertension    PONV (postoperative nausea and vomiting)    nausea only   Ulcer, stomach peptic yrs ago   Past Surgical History:  Procedure Laterality Date   APPENDECTOMY     both hells bone spur repair     both heels with metal clips   both shoulder rotator cuff repair     CESAREAN SECTION     x 1   COLONOSCOPY WITH PROPOFOL  N/A 09/07/2016   Procedure: COLONOSCOPY WITH PROPOFOL ;  Surgeon: Vicci Gladis POUR, MD;  Location: [facility];  Service: Endoscopy;  Laterality: N/A;   colonscopy  06/2011   polyps   ELBOW FRACTURE SURGERY Left    EYE SURGERY     FRACTURE SURGERY     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/10/2022   Procedure: REVERSE SHOULDER ARTHROPLASTY;  Surgeon: Melita Drivers, MD;  Location: [facility];  Service: Orthopedics;   Laterality: Right;  Please follow in room 6 if able   TUBAL LIGATION     VESICO-VAGINAL FISTULA REPAIR     Patient Active Problem List   Diagnosis Date Noted   Gastroesophageal reflux disease 11/16/2021   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 08/18/2021   Dermatofibroma 08/18/2021   History of malignant neoplasm of skin 08/18/2021   Lentigo 08/18/2021   Melanocytic nevi of trunk 08/18/2021   Nevus lipomatosus cutaneous superficialis 08/18/2021   Rosacea 08/18/2021   Actinic keratosis 08/18/2021   Sensorineural hearing loss (SNHL) of left ear with unrestricted hearing of right ear 07/26/2021   Tinnitus of left ear 07/26/2021   Acute recurrent maxillary sinusitis 06/22/2021   Primary hypertension 01/12/2021   Hyperlipidemia associated with type 2 diabetes mellitus (HCC) 01/12/2021   Arthritis 01/12/2021   Type II diabetes mellitus (HCC) 11/25/2019   Ingrown toenail 09/05/2018   Neck pain 01/29/2018   Tick bite 10/24/2017    ONSET DATE: 05/19/23  REFERRING DIAG:  Diagnosis  M79.641,M79.642 (ICD-10-CM) - Bilateral hand pain    THERAPY DIAG:  No diagnosis found.  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 Yes: NPRS scale: 4/10 Pain location: right hand,  left hand Pain description: aching, stiff Aggravating factors: gripping  Relieving factors: heat, meds Pt also has shoulder pain which is more significant grossly R shoulder 7/10 L shoulder pain 3/10 which OT will not address. PT is addressing shoulder FALLS: Has patient  fallen in last 6 months? No  LIVING ENVIRONMENT: Lives with: lives alone Lives in: House/apartment Stairs: yes   PLOF: Independent  PATIENT GOALS: improve functional use of hands and learn adapted strategies for ADLs/IADLs.  NEXT MD VISIT: unknown  OBJECTIVE:  Note: Objective measures were completed at Evaluation unless otherwise noted.  HAND DOMINANCE: Right  ADLs: Overall ADLs: unable to grip credit card to remove from ATM Transfers/ambulation related to ADLs: mod I Eating: drops silverware Grooming: drops toothbrush, uses electric toothbrush, holding comb Upper body dressing: adjusting bra Lower body dressing: difficulty pulling up pants  Toileting: hygeine was difficult initally Bathing: mod I, difficult with shaving Tub shower transfers: walk in shower    FUNCTIONAL OUTCOME MEASURES: Quick Dash: 05/30/23- 75%% disability Quick Dash 08/15/23- 70.5% disability   UPPER EXTREMITY ROM:   RUE wrist flexion/ extension: 70/ 50, LUE wrist flexion/ extension: 70/ 45 Pt demonstrates grossly 50% composite flexion for right and 55 with left.   Active ROM Right eval Left eval  Thumb MCP (0-60) 45 55  Thumb IP (0-80) 25 10  Thumb Radial abd/add (0-55)     Thumb Palmar abd/add (0-45)     Thumb Opposition to Small Finger yes yes   Index MCP (0-90)     Index PIP (0-100)     Index DIP (0-70)      Long MCP (0-90)      Long PIP (0-100)      Long DIP (0-70)      Ring MCP (0-90)      Ring PIP (0-100)      Ring DIP (0-70)      Little MCP (0-90)      Little PIP (0-100)      Little DIP (0-70)      (Blank rows = not tested) 9 hole peg test (08/15/23) RUE 26.70 secs, LUE 23 secs  HAND FUNCTION: Grip strength: Right: 8 lbs; Left: 4 lbs,          08/15/23-  RUE 18, LUE 14 lbs,           08/15/23   Pinch: RUE tip 4lbs, lateral 8 lbs, LUE tip 3 lbs, lateral 8 lbs   SENSATION: Light touch: Impaired index finger LUE  EDEMA: Pt with bony defomities at PIP joints of all digits  which pt reports are bone spurs, Pt also has bony deformity at DIP for right thumb, index and 5th digit and LUE small and index fingers  COGNITION: Overall cognitive status: Within functional limits for tasks assessed   OBSERVATIONS: Pleasant female desires to increase functional use of bilateral UE's  , TREATMENT DATE:Paraffin to RUE x 8 mins, no adverse reactions, while therapist performed US  to left hand 3.3 mhz, 0.8 w/cm 2,  20%x 8  mins no adverse reactions for stiffness. Paraffin to LUE x 8 mins, no adverse reactions, while therapist performed US  to right hand 3.3 mhz, 0.8 w/cm 2,  20%x 8 mins no adverse reactions for stiffness.  Passive finger flexion to bilateral hands. Therapist checked progress towards goals for PN/renewal as pt reports therapy has really helped her functional use of bilateral hands.  PATIENT EDUCATION: Education details:   see above Person educated: Patient Education method: Explanation, demonstration, v.c Education comprehension: verbalized understanding, returned demonstration  HOME EXERCISE PROGRAM: AROM yellow putty  GOALS: Goals reviewed with patient? Yes  SHORT TERM GOALS:   I with HEP  Goal status:  met 06/07/23  2.  I with positioning/ splinting prn to minimize pain and defomity   Goal status: met, 07/13/23  3.  Pt will increase bilateral grip strength by 5 lbs for increased UE functional use. Upgraded goal Pt will increase bilateral grip strength to at least 25 lbs (after stretching). Baseline: 05/30/23- RUE 8 lbs, LUE 4 lbs Goal status:met 08/01/23-R 20 lbs, L  19 lbs,  08/15/23- RUE 18 lbsLUE 14 lbs continue to address upgraded goal  - RUE 19 lbs, LUE 24- 10/03/23  RUE 23 lbs LUE 25 lbs, met for LUE, ongoing for right. 10/17/23 11/07/23-RUE 20 lbs, LUE 30 lbs, met for LUE only, not met for RUE due to shoulder pain  4. Pt will demonstrate improved RUE fine motor coordination as evidenced by perfroming 9 hole peg test in 23 secs or less.  Goal  status:10/17/23, 26.68- 10/24/23, 10/08/23-27.42, not met due to shoulder pain which impedes RUE function  5. I with updates to HEP.  Goal status: ongoing-continue as long term goal- 11/07/23  6. Pt will demonstrate improved composite finger flexion for ADLs as evidenced by pt. bringing middle fingertip for RUE within 1/2 an inch of palm.  Baseline: 3/4 inch from palm to middle fingertip  Goal status  not met 3/4 inch 09/19/23, 1 inch from palm 10/24/23, 11/07/23- 1 inch, not met  7.Pt will demonstrate improved composite finger flexion for ADLs as evidenced by pt. bringing middle fingertip for LUE within 3/4 an inch of palm.  Baseline: 1 inch from palm to middle finger tip  Goal status: met, 3/4 inch away from middle finger 09/19/23  LONG TERM GOALS: Target date: 12/13/23  I with updated HEP  Goal status:met 08/15/22  2.  Pt will improve Quick Dash score to 70% disability Baseline: 75% (05/30/23) Goal status:   improved - however not fully met due to complications from shoulder pain-70.5% 08/15/23  3.  Pt will demonstrate at least 65% composite flexion for LUE for increased functional use.  Goal status: met 65-70% , 08/15/23  4.  I with adapted strategies/ adapted equipment to minimize pain and to increase pt I with ADLs/IADLs  Goal status:  met for inital strategies, 11/07/23 ongoing, for addtional strategies such as picking hair.  5.  Pt will demonstrate at least 65% composite flexion for RUE for increased functional use.  Goal status: met 70% 08/15/23  6. Pt will report that she is able to cut her meat such as steak or chicken modified independently.  Baselne: unable  Goal status: met 09/21/23    7. Pt will report increased ease with opening/ closing ziplock bags.    Goal status: met 09/21/23    8. Pt will improve bilateral tip pinch by 2 lbs(from baseline) for increased ease with daily activities.  baseline: RUE tip 4lbs, LUE tip 3 lbs,    Goal status:  RUE 4  lbs, LUE 4 lbs,   ongoing 11/07/23    9. Pt will improve bilateral lateral pinch by 2 lbs for increased ease with ADLs.    Goal status: met RUE 12 lbs, LUE 14- 11/07/23   10 I with updates to HEP    goal status: new 11.Pt will demonstrate improved composite finger flexion for ADLs as evidenced by pt. bringing middle fingertip for RUE within 3/4 an inch of palm   following stretching. Baseline: 11/07/23- 1 inch from palm Goal status: new    12. Pt willl increase RUE grip strength to 25 lbs for increased functional use. Baseline7/8/21- 20 lbs Goal status: new ASSESSMENT: CLINICAL IMPRESSION: For the reporting period of 10/03/23- 11/07/23, pt has made progress towards goals. she has achieved 7/9 long term goals. Pt demonstrates progress towards remaining goals however overall progress has been impeded by bilateral shoulder pain which is being addressed by P.T. Pt can benefit from skilled occupational therapy to address bilateral hand stiffness, ROM, strength and functional use in order to maximize pt's safety and I with ADLs/IADLs.   PERFORMANCE DEFICITS: in functional skills including ADLs, IADLs, coordination, sensation, edema, ROM, strength, pain, flexibility, Fine motor control, Gross motor control, endurance, decreased knowledge of precautions, decreased knowledge of use of DME, and UE functional use,, and psychosocial skills including coping strategies, environmental adaptation, habits, interpersonal interactions, and routines and behaviors.   IMPAIRMENTS: are limiting patient from ADLs, IADLs, rest and sleep, education, play, leisure, and social participation.   COMORBIDITIES: may have co-morbidities  that affects occupational performance. Patient will benefit from skilled OT to address above impairments and improve overall function.  MODIFICATION OR ASSISTANCE TO COMPLETE EVALUATION: No modification of tasks or assist necessary to complete an evaluation.  OT OCCUPATIONAL PROFILE AND HISTORY:  Detailed assessment: Review of records and additional review of physical, cognitive, psychosocial history related to current functional performance.  CLINICAL DECISION MAKING: LOW - limited treatment options, no task modification necessary  REHAB POTENTIAL: Good  EVALUATION COMPLEXITY: Low      PLAN:  OT FREQUENCY: 1x week  OT DURATION: 5 weeks  PLANNED INTERVENTIONS: 97168 OT Re-evaluation, 97535 self care/ADL training, 02889 therapeutic exercise, 97530 therapeutic activity, 97112 neuromuscular re-education, 97140 manual therapy, 97113 aquatic therapy, 97035 ultrasound, 97018 paraffin, 02960 fluidotherapy, 97010 moist heat, 97010 cryotherapy, 97034 contrast bath, 97760 Orthotics management and training, 02239 Splinting (initial encounter), [id number] Subsequent splinting/medication, passive range of motion, energy conservation, coping strategies training, patient/family education, and DME and/or AE instructions  RECOMMENDED OTHER SERVICES: none  CONSULTED AND AGREED WITH PLAN OF CARE: Patient  PLAN FOR NEXT SESSION:   functional use and ROM for bilateral hands   Daxten Kovalenko, OT 11/08/2023, 12:26 PM                     OUTPATIENT OCCUPATIONAL THERAPY ORTHO Treatment  Patient Name: Veronica Galvan MRN: [id number] DOB:02/05/1956, 68 y.o., female Today's Date: 11/08/2023  PCP: Leita Eliza Elbe [name] REFERRING PROVIDER: Leita Eliza Elbe [name]  END OF SESSION:  OT End of Session - 11/08/23 1152     Visit Number 30    Number of Visits 35    Date for OT Re-Evaluation 12/13/23    Authorization Type [employer] MCR    Authorization Time Period 12 weeks    Authorization - Visit Number 30    Progress Note Due on Visit 40  OT Start Time 1403    OT Stop Time 1445    OT Time Calculation (min) 42 min    Activity Tolerance Patient tolerated treatment well    Behavior During Therapy WFL for tasks assessed/performed                          Past  Medical History:  Diagnosis Date   Allergy     Anxiety    Arthritis    hands   Cataract    Diabetes mellitus without complication (HCC)    type 2   Family history of adverse reaction to anesthesia    son has malignant hyperthermia, daughter does not daughter recently had c section without problems   GERD (gastroesophageal reflux disease)    Headache    sinus   Hyperlipidemia    Hypertension    PONV (postoperative nausea and vomiting)    nausea only   Ulcer, stomach peptic yrs ago   Past Surgical History:  Procedure Laterality Date   APPENDECTOMY     both hells bone spur repair     both heels with metal clips   both shoulder rotator cuff repair     CESAREAN SECTION     x 1   COLONOSCOPY WITH PROPOFOL  N/A 09/07/2016   Procedure: COLONOSCOPY WITH PROPOFOL ;  Surgeon: Vicci Gladis POUR, MD;  Location: [facility];  Service: Endoscopy;  Laterality: N/A;   colonscopy  06/2011   polyps   ELBOW FRACTURE SURGERY Left    EYE SURGERY     FRACTURE SURGERY     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/10/2022   Procedure: REVERSE SHOULDER ARTHROPLASTY;  Surgeon: Melita Drivers, MD;  Location: [facility];  Service: Orthopedics;  Laterality: Right;  Please follow in room 6 if able   TUBAL LIGATION     VESICO-VAGINAL FISTULA REPAIR     Patient Active Problem List   Diagnosis Date Noted   Gastroesophageal reflux disease 11/16/2021   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 08/18/2021   Dermatofibroma 08/18/2021   History of malignant neoplasm of skin 08/18/2021   Lentigo 08/18/2021   Melanocytic nevi of trunk 08/18/2021   Nevus lipomatosus cutaneous superficialis 08/18/2021   Rosacea 08/18/2021   Actinic keratosis 08/18/2021   Sensorineural hearing loss (SNHL) of left ear with unrestricted hearing of right ear 07/26/2021   Tinnitus of left ear 07/26/2021   Acute recurrent maxillary sinusitis 06/22/2021   Primary hypertension 01/12/2021   Hyperlipidemia  associated with type 2 diabetes mellitus (HCC) 01/12/2021   Arthritis 01/12/2021   Type II diabetes mellitus (HCC) 11/25/2019   Ingrown toenail 09/05/2018   Neck pain 01/29/2018   Tick bite 10/24/2017    ONSET DATE: 05/19/23  REFERRING DIAG:  Diagnosis  M79.641,M79.642 (ICD-10-CM) - Bilateral hand pain    THERAPY DIAG:  No diagnosis found.  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 Yes: NPRS scale: 6/10- right hand Pain location: right hand,  5/10 left hand Pain description: aching, stiff Aggravating factors: gripping  Relieving factors: heat, meds Pt also has shoulder pain  which is more significant grossly R shoulder 8/10 L shoulder pain 3/10 which OT will not address. PT is addressing shoulder FALLS: Has patient fallen in last 6 months? No  LIVING ENVIRONMENT: Lives with: lives alone Lives in: House/apartment Stairs: yes   PLOF: Independent  PATIENT GOALS: improve functional use of hands and learn adapted strategies for ADLs/IADLs.  NEXT MD VISIT: unknown  OBJECTIVE:  Note: Objective measures were completed at Evaluation unless otherwise noted.  HAND DOMINANCE: Right  ADLs: Overall ADLs: unable to grip credit card to remove from ATM Transfers/ambulation related to ADLs: mod I Eating: drops silverware Grooming: drops toothbrush, uses electric toothbrush, holding comb Upper body dressing: adjusting bra Lower body dressing: difficulty pulling up pants  Toileting: hygeine was difficult initally Bathing: mod I, difficult with shaving Tub  shower transfers: walk in shower    FUNCTIONAL OUTCOME MEASURES: Quick Dash: 05/30/23- 75%% disability Quick Dash 08/15/23- 70.5% disability   UPPER EXTREMITY ROM:   RUE wrist flexion/ extension: 70/ 50, LUE wrist flexion/ extension: 70/ 45 Pt demonstrates grossly 50% composite flexion for right and 555 with left.   Active ROM Right eval Left eval  Thumb MCP (0-60) 45 55  Thumb IP (0-80) 25 10  Thumb Radial abd/add (0-55)     Thumb Palmar abd/add (0-45)     Thumb Opposition to Small Finger yes yes   Index MCP (0-90)     Index PIP (0-100)     Index DIP (0-70)      Long MCP (0-90)      Long PIP (0-100)      Long DIP (0-70)      Ring MCP (0-90)      Ring PIP (0-100)      Ring DIP (0-70)      Little MCP (0-90)      Little PIP (0-100)      Little DIP (0-70)      (Blank rows = not tested) 9 hole peg test (08/15/23) RUE 26.70 secs, LUE 23 secs  HAND FUNCTION: Grip strength: Right: 8 lbs; Left: 4 lbs,          08/15/23-  RUE 18, LUE 14 lbs,           08/15/23   Pinch: RUE tip 4lbs, lateral 8 lbs, LUE tip 3 lbs, lateral 8 lbs   SENSATION: Light touch: Impaired index finger LUE  EDEMA: Pt with bony defomities at PIP joints of all digits which pt reports are bone spurs, Pt also has bony deformity at DIP for right thumb, index and 5th digit and LUE small and index fingers  COGNITION: Overall cognitive status: Within functional limits for tasks assessed   OBSERVATIONS: Pleasant female desires to increase functional use of bilateral UE's  , TREATMENT DATE:10/31/23-Paraffin to RUE x 8 mins, no adverse reactions, while therapist performed US  to left hand 3.3 mhz, 0.8 w/cm 2,  20%x 8 mins no adverse reactions for stiffness. Paraffin to LUE x 8 mins, no adverse reactions, while therapist performed US  to right hand 3.3 mhz, 0.8 w/cm 2,  20%x 8 mins no adverse reactions for stiffness.  Pt reports increased pain and stiffness after using her hands alot to prepare meals for [employer] last week.  Pt was encouraged to rest her hands and use paraffin at home if possible. Therapist recommends avoiding use of putty for several days o allow pain to resolve. Discussed plans to renew next visit and to continue for another month. Gentle soft tissue and joint mobs/  distraction to digits, hand, wrist forearms bilaterally  Passive composite finger flexion to right hand and then individual finger flexion to left hand .  10/24/23-Paraffin to RUE x 10 mins, no adverse reactions, while therapist performed US  to left hand 3.3 mhz, 0.8 w/cm 2,  20%x 8 mins no adverse reactions for stiffness. Paraffin to LUE x 10 mins, no adverse reactions, while therapist performed US  to right hand 3.3 mhz, 0.8 w/cm 2,  20%x 8 mins no adverse reactions for stiffness.  Gentle soft tissue and joint mobs/ distraction to digits, hand, wrist bilaterally as welll as right forearm, pt reports  hands and wrists are feeling looser at end of session. Passive composite finger flexion to right hand and then individual finger flexion to left hand . Therapist checked grip strength:  RUE 23 lbs LUE 25 lbs, pt demonstrates bilateral improvements and she reports continuing improving functional use.   10/03/23 Paraffin to RUE x 10 mins, no adverse reactions, while therapist performed US  to left hand 3.3 mhz, 0.8 w/cm 2,  20%x 8 mins no adverse reactions for stiffness. Paraffin to LUE x 10 mins, no adverse reactions, while therapist performed US  to right hand 3.3 mhz, 0.8 w/cm 2,  20%x 8 mins no adverse reactions for stiffness.  Passive composite finger flexion to right hand and then individual finger flexion to left hand . Gentle soft tissue and joint mobs/ distraction to digits, hand, wrist bilaterally as welll as right foream, pt reports feeling looser Resistive gripping with  tree squeeze ball left and right UE's digi flex, light resistance for composite and individual finger flexion for LUE   PATIENT EDUCATION: Education details:   see  above Person educated: Patient Education method: Explanation, demonstration, v.c Education comprehension: verbalized understanding, returned demonstration  HOME EXERCISE PROGRAM: AROM yellow putty  GOALS: Goals reviewed with patient? Yes  SHORT TERM GOALS: Target date: 09/14/23  I with HEP  Goal status:  met 06/07/23  2.  I with positioning/ splinting prn to minimize pain and defomity   Goal status: met, 07/13/23  3.  Pt will increase bilateral grip strength by 5 lbs for increased UE functional use. Upgraded goal Pt will increase bilateral grip strength to at least 25 lbs (after stretching). Baseline: 05/30/23- RUE 8 lbs, LUE 4 lbs Goal status:met 08/01/23-R 20 lbs, L  19 lbs,  08/15/23- RUE 18 lbsLUE 14 lbs continue to address upgraded goal  - RUE 19 lbs, LUE 24- 10/03/23  RUE 23 lbs LUE 25 lbs, met for LUE, ongoing for right. 10/17/23  4. Pt will demonstrate improved RUE fine motor coordination as evidenced by perfroming 9 hole peg test in 23 secs or less.  Goal status: ongoing 10/17/23 5. I with updates to HEP.  Goal status: ongoing 10/17/23 6. Pt will demonstrate improved composite finger flexion for ADLs as evidenced by pt. bringing middle fingertip for RUE within 1/2 an inch of palm.  Baseline: 3/4 inch from palm to middle fingertip  Goal status : ongoing, 3/4 inch 09/19/23 7.Pt will demonstrate improved composite finger flexion for ADLs as evidenced by pt. bringing middle fingertip for LUE within 3/4 an inch of palm.  Baseline: 1 inch from palm to middle finger tip  Goal status: met, 3/4 inch away from middle finger 09/19/23  LONG TERM GOALS: Target date:11/07/23  I with updated HEP  Goal status:met 08/15/22  2.  Pt will improve Quick Dash score to 70% disability Baseline: 75% (05/30/23) Goal status:   improved - however not  fully met due to complications from shoulder pain-70.5%08/15/23  3.  Pt will demonstrate at least 65% composte flexion for LUE for increased functional  use.  Goal status: met 65-70% , 08/15/23  4.  I with adapted strategies/ adapted equipment to minimize pain and to increase pt I with ADLs/IADLs  Goal status:  met for inital strategies, however continue goal as pt may report additional tasks that can benefit from modification.ongoing  10/31/23  5.  Pt will demonstrate at least 65% composite flexion for RUE for increased functional use.  Goal status: met 70% 08/15/23  6. Pt will report that she is able to cut her meat such as steak or chicken modified independently.  Baselne: unable  Goal status: met 09/21/23    7. Pt will report increased ease with opening/ closing ziplock bags.    Goal status: met 09/21/23    8. Pt will improve bilateral tip pinch by 2 lbs for increased ease with daily activities.     Goal status: ongoing 10/31/23    9. Pt will improve bilateral lateral pinch by 2 lbs for increased ease with ADLs.    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   IMPAIRMENTS: are limiting patient from ADLs, IADLs, rest and sleep, education, play, leisure, and social participation.   COMORBIDITIES: may have co-morbidities  that affects occupational performance. Patient will benefit from skilled OT to address above impairments and improve overall function.  MODIFICATION OR ASSISTANCE TO COMPLETE EVALUATION: No modification of tasks or assist necessary to complete an evaluation.  OT OCCUPATIONAL PROFILE  AND HISTORY: Detailed assessment: Review of records and additional review of physical, cognitive, psychosocial history related to current functional performance.  CLINICAL DECISION MAKING: LOW - limited treatment options, no task modification necessary  REHAB POTENTIAL: Good  EVALUATION COMPLEXITY: Low      PLAN:  OT FREQUENCY: 2x/week  OT DURATION: 12 weeks( anticipate d/c after 8 weeks dependent on progress)  PLANNED INTERVENTIONS: 02831 OT Re-evaluation, 97535 self care/ADL training, 02889 therapeutic exercise, 97530 therapeutic activity, 97112 neuromuscular re-education, 97140 manual therapy, 97113 aquatic therapy, 97035 ultrasound, 97018 paraffin, 02960 fluidotherapy, 97010 moist heat, 97010 cryotherapy, 97034 contrast bath, 97760 Orthotics management and training, 02239 Splinting (initial encounter), [id number] Subsequent splinting/medication, passive range of motion, energy conservation, coping strategies training, patient/family education, and DME and/or AE instructions  RECOMMENDED OTHER SERVICES: none  CONSULTED AND AGREED WITH PLAN OF CARE: Patient  PLAN FOR NEXT SESSION:  check  goals, renew, functional use and ROM for bilateral hands   Jacqueleen Pulver, OT 11/08/2023, 12:26 PM                     OUTPATIENT OCCUPATIONAL THERAPY ORTHO Treatment  Patient Name: Veronica Galvan MRN: [id number] DOB:03-12-1956, 68 y.o., female Today's Date: 11/08/2023  PCP: Leita Eliza Elbe [name] REFERRING PROVIDER: Leita Eliza Elbe [name]  END OF SESSION:  OT End of Session - 11/08/23 1152     Visit Number 30    Number of Visits 35    Date for OT Re-Evaluation 12/13/23    Authorization Type BCBS  MCR    Authorization Time Period 12 weeks    Authorization - Visit Number 30    Progress Note Due on Visit 40    OT Start Time 1403    OT Stop Time 1445    OT Time Calculation (min) 42 min    Activity Tolerance Patient tolerated treatment well    Behavior During Therapy  WFL for tasks assessed/performed                          Past Medical History:  Diagnosis Date   Allergy     Anxiety    Arthritis    hands   Cataract    Diabetes mellitus without complication (HCC)    type 2   Family history of adverse reaction to anesthesia    son has malignant hyperthermia, daughter does not daughter recently had c section without problems   GERD (gastroesophageal reflux disease)    Headache    sinus   Hyperlipidemia    Hypertension    PONV (postoperative nausea and vomiting)    nausea only   Ulcer, stomach peptic yrs ago   Past Surgical History:  Procedure Laterality Date   APPENDECTOMY     both hells bone spur repair     both heels with metal clips   both shoulder rotator cuff repair     CESAREAN SECTION     x 1   COLONOSCOPY WITH PROPOFOL  N/A 09/07/2016   Procedure: COLONOSCOPY WITH PROPOFOL ;  Surgeon: Vicci Gladis POUR, MD;  Location: [facility];  Service: Endoscopy;  Laterality: N/A;   colonscopy  06/2011   polyps   ELBOW FRACTURE SURGERY Left    EYE SURGERY     FRACTURE SURGERY     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/10/2022   Procedure: REVERSE SHOULDER ARTHROPLASTY;  Surgeon: Melita Drivers, MD;  Location: [facility];  Service: Orthopedics;  Laterality: Right;  Please follow in room 6 if able   TUBAL LIGATION     VESICO-VAGINAL FISTULA REPAIR     Patient Active Problem List   Diagnosis Date Noted   Gastroesophageal reflux disease 11/16/2021   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 08/18/2021   Dermatofibroma 08/18/2021   History of malignant neoplasm of skin 08/18/2021   Lentigo 08/18/2021   Melanocytic nevi of trunk 08/18/2021   Nevus lipomatosus cutaneous superficialis 08/18/2021   Rosacea 08/18/2021   Actinic keratosis 08/18/2021   Sensorineural hearing loss (SNHL) of left ear with unrestricted hearing of right ear 07/26/2021   Tinnitus of left ear 07/26/2021   Acute recurrent  maxillary sinusitis 06/22/2021   Primary hypertension 01/12/2021   Hyperlipidemia associated with type 2 diabetes mellitus (HCC) 01/12/2021   Arthritis 01/12/2021   Type II diabetes mellitus (HCC) 11/25/2019   Ingrown toenail 09/05/2018   Neck pain 01/29/2018   Tick bite 10/24/2017    ONSET DATE: 05/19/23  REFERRING DIAG:  Diagnosis  M79.641,M79.642 (ICD-10-CM) - Bilateral hand pain    THERAPY DIAG:  No diagnosis found.  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 Yes: NPRS  scale: 4/10 Pain location: right hand,  left hand Pain description: aching, stiff Aggravating factors: gripping  Relieving factors: heat, meds Pt also has shoulder pain which is more significant grossly R shoulder 7/10 L shoulder pain 3/10 which OT will not address. PT is addressing shoulder FALLS: Has patient fallen in last 6 months? No  LIVING ENVIRONMENT: Lives with: lives alone Lives in: House/apartment Stairs: yes   PLOF: Independent  PATIENT GOALS: improve functional use of hands and learn adapted strategies for ADLs/IADLs.  NEXT MD VISIT: unknown  OBJECTIVE:  Note: Objective measures were completed at Evaluation unless otherwise noted.  HAND DOMINANCE: Right  ADLs: Overall ADLs: unable to grip credit card to remove from ATM Transfers/ambulation related to ADLs: mod I Eating: drops silverware Grooming: drops toothbrush, uses electric toothbrush, holding comb Upper body dressing: adjusting bra Lower body dressing: difficulty pulling up pants  Toileting: hygeine  was difficult initally Bathing: mod I, difficult with shaving Tub shower transfers: walk in shower    FUNCTIONAL OUTCOME MEASURES: Quick Dash: 05/30/23- 75%% disability Quick Dash 08/15/23- 70.5% disability   UPPER EXTREMITY ROM:   RUE wrist flexion/ extension: 70/ 50, LUE wrist flexion/ extension: 70/ 45 Pt demonstrates grossly 50% composite flexion for right and 555 with left.   Active ROM Right eval Left eval  Thumb MCP (0-60) 45 55  Thumb IP (0-80) 25 10  Thumb Radial abd/add (0-55)     Thumb Palmar abd/add (0-45)     Thumb Opposition to Small Finger yes yes   Index MCP (0-90)     Index PIP (0-100)     Index DIP (0-70)      Long MCP (0-90)      Long PIP (0-100)      Long DIP (0-70)      Ring MCP (0-90)      Ring PIP (0-100)      Ring DIP (0-70)      Little MCP (0-90)      Little PIP (0-100)      Little DIP (0-70)      (Blank rows = not tested) 9 hole peg test (08/15/23) RUE 26.70 secs, LUE 23 secs  HAND FUNCTION: Grip strength: Right: 8 lbs; Left: 4 lbs,          08/15/23-  RUE 18, LUE 14 lbs,           08/15/23   Pinch: RUE tip 4lbs, lateral 8 lbs, LUE tip 3 lbs, lateral 8 lbs   SENSATION: Light touch: Impaired index finger LUE  EDEMA: Pt with bony defomities at PIP joints of all digits which pt reports are bone spurs, Pt also has bony deformity at DIP for right thumb, index and 5th digit and LUE small and index fingers  COGNITION: Overall cognitive status: Within functional limits for tasks assessed   OBSERVATIONS: Pleasant female desires to increase functional use of bilateral UE's  , TREATMENT DATE:10/24/23-Paraffin to RUE x 10 mins, no adverse reactions, while therapist performed US  to left hand 3.3 mhz, 0.8 w/cm 2,  20%x 8 mins no adverse reactions for stiffness. Paraffin to LUE x 10 mins, no adverse reactions, while therapist performed US  to right hand 3.3 mhz, 0.8 w/cm 2,  20%x 8 mins no adverse reactions for stiffness.  Gentle soft tissue and joint  mobs/ distraction to digits, hand, wrist bilaterally as welll as right forearm,  Passive finger flexion to bilateral hands. Therpist checked 9 hole peg test, see goals. 10/17/23-Paraffin to RUE x 10 mins,  no adverse reactions, while therapist performed US  to left hand 3.3 mhz, 0.8 w/cm 2,  20%x 8 mins no adverse reactions for stiffness. Paraffin to LUE x 10 mins, no adverse reactions, while therapist performed US  to right hand 3.3 mhz, 0.8 w/cm 2,  20%x 8 mins no adverse reactions for stiffness.  Gentle soft tissue and joint mobs/ distraction to digits, hand, wrist bilaterally as welll as right forearm, pt reports  hands and wrists are feeling looser at end of session. Passive composite finger flexion to right hand and then individual finger flexion to left hand . Therapist checked grip strength:  RUE 23 lbs LUE 25 lbs, pt demonstrates bilateral improvements and she reports continuing improving functional use.   10/03/23 Paraffin to RUE x 10 mins, no adverse reactions, while therapist performed US  to left hand 3.3 mhz, 0.8 w/cm 2,  20%x 8 mins no adverse reactions for stiffness. Paraffin to LUE x 10 mins, no adverse reactions, while therapist performed US  to right hand 3.3 mhz, 0.8 w/cm 2,  20%x 8 mins no adverse reactions for stiffness.  Passive composite finger flexion to right hand and then individual finger flexion to left hand . Gentle soft tissue and joint mobs/ distraction to digits, hand, wrist bilaterally as welll as right foream, pt reports feeling looser Resistive gripping with  tree squeeze ball left and right UE's digi flex, light resistance for composite and individual finger flexion for LUE   PATIENT EDUCATION: Education details:   see above Person educated: Patient Education method: Explanation, demonstration, v.c Education comprehension: verbalized understanding, returned demonstration  HOME EXERCISE PROGRAM: AROM yellow putty  GOALS: Goals reviewed with patient?  Yes  SHORT TERM GOALS: Target date: 09/14/23  I with HEP  Goal status:  met 06/07/23  2.  I with positioning/ splinting prn to minimize pain and defomity   Goal status: met, 07/13/23  3.  Pt will increase bilateral grip strength by 5 lbs for increased UE functional use. Upgraded goal Pt will increase bilateral grip strength to at least 25 lbs (after stretching). Baseline: 05/30/23- RUE 8 lbs, LUE 4 lbs Goal status:met 08/01/23-R 20 lbs, L  19 lbs,  08/15/23- RUE 18 lbsLUE 14 lbs continue to address upgraded goal  - RUE 19 lbs, LUE 24- 10/03/23  RUE 23 lbs LUE 25 lbs, met for LUE, ongoing for right. 10/17/23  4. Pt will demonstrate improved RUE fine motor coordination as evidenced by perfroming 9 hole peg test in 23 secs or less.  Goal status: ongoing 10/17/23, ongoing 26.68- 10/24/23 5. I with updates to HEP.  Goal status: ongoing 10/17/23 6. Pt will demonstrate improved composite finger flexion for ADLs as evidenced by pt. bringing middle fingertip for RUE within 1/2 an inch of palm.  Baseline: 3/4 inch from palm to middle fingertip  Goal status : ongoing, 3/4 inch 09/19/23, 1 inch from palm 10/24/23 7.Pt will demonstrate improved composite finger flexion for ADLs as evidenced by pt. bringing middle fingertip for LUE within 3/4 an inch of palm.  Baseline: 1 inch from palm to middle finger tip  Goal status: met, 3/4 inch away from middle finger 09/19/23  LONG TERM GOALS: Target date:11/07/23  I with updated HEP  Goal status:met 08/15/22  2.  Pt will improve Quick Dash score to 70% disability Baseline: 75% (05/30/23) Goal status:   improved - however not fully met due to complications from shoulder pain-70.5%08/15/23  3.  Pt will demonstrate at least 65% composte flexion for LUE for increased  functional use.  Goal status: met 65-70% , 08/15/23  4.  I with adapted strategies/ adapted equipment to minimize pain and to increase pt I with ADLs/IADLs  Goal status:  met for inital strategies, however  continue goal as pt may report additional tasks that can benefit from modification.ongoing 10/03/23  5.  Pt will demonstrate at least 65% composite flexion for RUE for increased functional use.  Goal status: met 70% 08/15/23  6. Pt will report that she is able to cut her meat such as steak or chicken modified independently.  [place]: unable  Goal status: met 09/21/23    7. Pt will report increased ease with opening/ closing ziplock bags.    Goal status: met 09/21/23    8. Pt will improve bilateral tip pinch by 2 lbs for increased ease with daily activities.     Goal status: ongoing 09/19/23    9. Pt will improve bilateral lateral pinch by 2 lbs for increased ease with ADLs.    Goal status: ongoing 09/19/23   ASSESSMENT: CLINICAL IMPRESSION: Pt reports improved ability to use hands functionally for meal prep during VBS. Today hand pain is 4/10 which is improved overall. PERFORMANCE DEFICITS: in functional skills including ADLs, IADLs, coordination, sensation, edema, ROM, strength, pain, flexibility, Fine motor control, Gross motor control, endurance, decreased knowledge of precautions, decreased knowledge of use of DME, and UE functional use,, and psychosocial skills including coping strategies, environmental adaptation, habits, interpersonal interactions, and routines and behaviors.   IMPAIRMENTS: are limiting patient from ADLs, IADLs, rest and sleep, education, play, leisure, and social participation.   COMORBIDITIES: may have co-morbidities  that affects occupational performance. Patient will benefit from skilled OT to address above impairments and improve overall function.  MODIFICATION OR ASSISTANCE TO COMPLETE EVALUATION: No modification of tasks or assist necessary to complete an evaluation.  OT OCCUPATIONAL PROFILE AND HISTORY: Detailed assessment: Review of records and additional review of physical, cognitive, psychosocial history related to current functional performance.  CLINICAL  DECISION MAKING: LOW - limited treatment options, no task modification necessary  REHAB POTENTIAL: Good  EVALUATION COMPLEXITY: Low      PLAN:  OT FREQUENCY: 2x/week  OT DURATION: 12 weeks( anticipate d/c after 8 weeks dependent on progress)  PLANNED INTERVENTIONS: 02831 OT Re-evaluation, 97535 self care/ADL training, 02889 therapeutic exercise, 97530 therapeutic activity, 97112 neuromuscular re-education, 97140 manual therapy, 97113 aquatic therapy, 97035 ultrasound, 97018 paraffin, 02960 fluidotherapy, 97010 moist heat, 97010 cryotherapy, 97034 contrast bath, 97760 Orthotics management and training, 02239 Splinting (initial encounter), [id number] Subsequent splinting/medication, passive range of motion, energy conservation, coping strategies training, patient/family education, and DME and/or AE instructions  RECOMMENDED OTHER SERVICES: none  CONSULTED AND AGREED WITH PLAN OF CARE: Patient  PLAN FOR NEXT SESSION:   functional use and ROM for bilateral hands   Bradden Tadros, OT 11/08/2023, 12:26 PM                     OUTPATIENT OCCUPATIONAL THERAPY ORTHO Treatment  Patient Name: Veronica Galvan MRN: [id number] DOB:07/23/1955, 68 y.o., female Today's Date: 11/08/2023  PCP: Leita Eliza Elbe [name] REFERRING PROVIDER: Leita Eliza Elbe [name]  END OF SESSION:  OT End of Session - 11/08/23 1152     Visit Number 30    Number of Visits 35    Date for OT Re-Evaluation 12/13/23    Authorization Type BCBS MCR    Authorization Time Period 12 weeks    Authorization - Visit Number 30    Progress Note  Due on Visit 40    OT Start Time 1403    OT Stop Time 1445    OT Time Calculation (min) 42 min    Activity Tolerance Patient tolerated treatment well    Behavior During Therapy WFL for tasks assessed/performed                          Past Medical History:  Diagnosis Date   Allergy     Anxiety    Arthritis    hands   Cataract    Diabetes  mellitus without complication (HCC)    type 2   Family history of adverse reaction to anesthesia    son has malignant hyperthermia, daughter does not daughter recently had c section without problems   GERD (gastroesophageal reflux disease)    Headache    sinus   Hyperlipidemia    Hypertension    PONV (postoperative nausea and vomiting)    nausea only   Ulcer, stomach peptic yrs ago   Past Surgical History:  Procedure Laterality Date   APPENDECTOMY     both hells bone spur repair     both heels with metal clips   both shoulder rotator cuff repair     CESAREAN SECTION     x 1   COLONOSCOPY WITH PROPOFOL  N/A 09/07/2016   Procedure: COLONOSCOPY WITH PROPOFOL ;  Surgeon: Vicci Gladis POUR, MD;  Location: [facility];  Service: Endoscopy;  Laterality: N/A;   colonscopy  06/2011   polyps   ELBOW FRACTURE SURGERY Left    EYE SURGERY     FRACTURE SURGERY     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/10/2022   Procedure: REVERSE SHOULDER ARTHROPLASTY;  Surgeon: Melita Drivers, MD;  Location: [facility];  Service: Orthopedics;  Laterality: Right;  Please follow in room 6 if able   TUBAL LIGATION     VESICO-VAGINAL FISTULA REPAIR     Patient Active Problem List   Diagnosis Date Noted   Gastroesophageal reflux disease 11/16/2021   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 08/18/2021   Dermatofibroma 08/18/2021   History of malignant neoplasm of skin 08/18/2021   Lentigo 08/18/2021   Melanocytic nevi of trunk 08/18/2021   Nevus lipomatosus cutaneous superficialis 08/18/2021   Rosacea 08/18/2021   Actinic keratosis 08/18/2021   Sensorineural hearing loss (SNHL) of left ear with unrestricted hearing of right ear 07/26/2021   Tinnitus of left ear 07/26/2021   Acute recurrent maxillary sinusitis 06/22/2021   Primary hypertension 01/12/2021   Hyperlipidemia associated with type 2 diabetes mellitus (HCC) 01/12/2021   Arthritis 01/12/2021   Type II diabetes mellitus  (HCC) 11/25/2019   Ingrown toenail 09/05/2018   Neck pain 01/29/2018   Tick bite 10/24/2017    ONSET DATE: 05/19/23  REFERRING DIAG:  Diagnosis  M79.641,M79.642 (ICD-10-CM) - Bilateral hand pain    THERAPY DIAG:  No diagnosis found.  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 Yes: NPRS scale: 7/10- right hand Pain location: right hand,  3/10 left hand Pain description: aching, stiff Aggravating factors: gripping  Relieving factors:  heat, meds Pt also has shoulder pain which is more significant grossly R shoulder 8/10 L shoulder pain 3/10 which OT will not address. PT is addressing shoulder FALLS: Has patient fallen in last 6 months? No  LIVING ENVIRONMENT: Lives with: lives alone Lives in: House/apartment Stairs: yes   PLOF: Independent  PATIENT GOALS: improve functional use of hands and learn adapted strategies for ADLs/IADLs.  NEXT MD VISIT: unknown  OBJECTIVE:  Note: Objective measures were completed at Evaluation unless otherwise noted.  HAND DOMINANCE: Right  ADLs: Overall ADLs: unable to grip credit card to remove from ATM Transfers/ambulation related to ADLs: mod I Eating: drops silverware Grooming: drops toothbrush, uses electric toothbrush, holding comb Upper body dressing: adjusting bra Lower body dressing: difficulty pulling up pants  Toileting: hygeine was difficult initally Bathing: mod I, difficult with shaving Tub shower transfers: walk in shower    FUNCTIONAL OUTCOME MEASURES: Quick Dash: 05/30/23- 75%%  disability Quick Dash 08/15/23- 70.5% disability   UPPER EXTREMITY ROM:   RUE wrist flexion/ extension: 70/ 50, LUE wrist flexion/ extension: 70/ 45 Pt demonstrates grossly 50% composite flexion for right and 555 with left.   Active ROM Right eval Left eval  Thumb MCP (0-60) 45 55  Thumb IP (0-80) 25 10  Thumb Radial abd/add (0-55)     Thumb Palmar abd/add (0-45)     Thumb Opposition to Small Finger yes yes   Index MCP (0-90)     Index PIP (0-100)     Index DIP (0-70)      Long MCP (0-90)      Long PIP (0-100)      Long DIP (0-70)      Ring MCP (0-90)      Ring PIP (0-100)      Ring DIP (0-70)      Little MCP (0-90)      Little PIP (0-100)      Little DIP (0-70)      (Blank rows = not tested) 9 hole peg test (08/15/23) RUE 26.70 secs, LUE 23 secs  HAND FUNCTION: Grip strength: Right: 8 lbs; Left: 4 lbs,          08/15/23-  RUE 18, LUE 14 lbs,           08/15/23   Pinch: RUE tip 4lbs, lateral 8 lbs, LUE tip 3 lbs, lateral 8 lbs   SENSATION: Light touch: Impaired index finger LUE  EDEMA: Pt with bony defomities at PIP joints of all digits which pt reports are bone spurs, Pt also has bony deformity at DIP for right thumb, index and 5th digit and LUE small and index fingers  COGNITION: Overall cognitive status: Within functional limits for tasks assessed   OBSERVATIONS: Pleasant female desires to increase functional use of bilateral UE's  , TREATMENT DATE:Paraffin to RUE x 10 mins, no adverse reactions, while therapist performed US  to left hand 3.3 mhz, 0.8 w/cm 2,  20%x 8 mins no adverse reactions for stiffness. Paraffin to LUE x 10 mins, no adverse reactions, while therapist performed US  to right hand 3.3 mhz, 0.8 w/cm 2,  20%x 8 mins no adverse reactions for stiffness.  Gentle soft tissue and joint mobs/ distraction to digits, hand, wrist bilaterally as welll as right forearm, pt reports  hands and wrists are feeling looser at end of session. Passive composite finger  flexion to right hand and then individual finger flexion to left hand . Therapist checked grip strength:  RUE 23 lbs LUE 25 lbs,  pt demonstrates bilateral improvements and she reports continuing improving functional use.   10/03/23 Paraffin to RUE x 10 mins, no adverse reactions, while therapist performed US  to left hand 3.3 mhz, 0.8 w/cm 2,  20%x 8 mins no adverse reactions for stiffness. Paraffin to LUE x 10 mins, no adverse reactions, while therapist performed US  to right hand 3.3 mhz, 0.8 w/cm 2,  20%x 8 mins no adverse reactions for stiffness.  Passive composite finger flexion to right hand and then individual finger flexion to left hand . Gentle soft tissue and joint mobs/ distraction to digits, hand, wrist bilaterally as welll as right foream, pt reports feeling looser Resistive gripping with  tree squeeze ball left and right UE's digi flex, light resistance for composite and individual finger flexion for LUE   PATIENT EDUCATION: Education details:   see above Person educated: Patient Education method: Explanation, demonstration, v.c Education comprehension: verbalized understanding, returned demonstration  HOME EXERCISE PROGRAM: AROM yellow putty  GOALS: Goals reviewed with patient? Yes  SHORT TERM GOALS: Target date: 09/14/23  I with HEP  Goal status:  met 06/07/23  2.  I with positioning/ splinting prn to minimize pain and defomity   Goal status: met, 07/13/23  3.  Pt will increase bilateral grip strength by 5 lbs for increased UE functional use. Upgraded goal Pt will increase bilateral grip strength to at least 25 lbs (after stretching). Baseline: 05/30/23- RUE 8 lbs, LUE 4 lbs Goal status:met 08/01/23-R 20 lbs, L  19 lbs,  08/15/23- RUE 18 lbsLUE 14 lbs continue to address upgraded goal  - RUE 19 lbs, LUE 24- 10/03/23  RUE 23 lbs LUE 25 lbs, met for LUE, ongoing for right. 10/17/23  4. Pt will demonstrate improved RUE fine motor coordination as evidenced by perfroming 9 hole  peg test in 23 secs or less.  Goal status: ongoing 10/17/23 5. I with updates to HEP.  Goal status: ongoing 10/17/23 6. Pt will demonstrate improved composite finger flexion for ADLs as evidenced by pt. bringing middle fingertip for RUE within 1/2 an inch of palm.  Baseline: 3/4 inch from palm to middle fingertip  Goal status : ongoing, 3/4 inch 09/19/23 7.Pt will demonstrate improved composite finger flexion for ADLs as evidenced by pt. bringing middle fingertip for LUE within 3/4 an inch of palm.  Baseline: 1 inch from palm to middle finger tip  Goal status: met, 3/4 inch away from middle finger 09/19/23  LONG TERM GOALS: Target date:11/07/23  I with updated HEP  Goal status:met 08/15/22  2.  Pt will improve Quick Dash score to 70% disability Baseline: 75% (05/30/23) Goal status:   improved - however not fully met due to complications from shoulder pain-70.5%08/15/23  3.  Pt will demonstrate at least 65% composte flexion for LUE for increased functional use.  Goal status: met 65-70% , 08/15/23  4.  I with adapted strategies/ adapted equipment to minimize pain and to increase pt I with ADLs/IADLs  Goal status:  met for inital strategies, however continue goal as pt may report additional tasks that can benefit from modification.ongoing 10/03/23  5.  Pt will demonstrate at least 65% composite flexion for RUE for increased functional use.  Goal status: met 70% 08/15/23  6. Pt will report that she is able to cut her meat such as steak or chicken modified independently.  [place]: unable  Goal status: met 09/21/23    7. Pt will report increased ease with opening/ closing ziplock bags.    Goal  status: met 09/21/23    8. Pt will improve bilateral tip pinch by 2 lbs for increased ease with daily activities.     Goal status: ongoing 09/19/23    9. Pt will improve bilateral lateral pinch by 2 lbs for increased ease with ADLs.    Goal status: ongoing 09/19/23   ASSESSMENT: CLINICAL IMPRESSION: Pt  demonstrates improving flexibility and grip strength.PERFORMANCE DEFICITS: in functional skills including ADLs, IADLs, coordination, sensation, edema, ROM, strength, pain, flexibility, Fine motor control, Gross motor control, endurance, decreased knowledge of precautions, decreased knowledge of use of DME, and UE functional use,, and psychosocial skills including coping strategies, environmental adaptation, habits, interpersonal interactions, and routines and behaviors.   IMPAIRMENTS: are limiting patient from ADLs, IADLs, rest and sleep, education, play, leisure, and social participation.   COMORBIDITIES: may have co-morbidities  that affects occupational performance. Patient will benefit from skilled OT to address above impairments and improve overall function.  MODIFICATION OR ASSISTANCE TO COMPLETE EVALUATION: No modification of tasks or assist necessary to complete an evaluation.  OT OCCUPATIONAL PROFILE AND HISTORY: Detailed assessment: Review of records and additional review of physical, cognitive, psychosocial history related to current functional performance.  CLINICAL DECISION MAKING: LOW - limited treatment options, no task modification necessary  REHAB POTENTIAL: Good  EVALUATION COMPLEXITY: Low      PLAN:  OT FREQUENCY: 2x/week  OT DURATION: 12 weeks( anticipate d/c after 8 weeks dependent on progress)  PLANNED INTERVENTIONS: 02831 OT Re-evaluation, 97535 self care/ADL training, 02889 therapeutic exercise, 97530 therapeutic activity, 97112 neuromuscular re-education, 97140 manual therapy, 97113 aquatic therapy, 97035 ultrasound, 97018 paraffin, 02960 fluidotherapy, 97010 moist heat, 97010 cryotherapy, 97034 contrast bath, 97760 Orthotics management and training, 02239 Splinting (initial encounter), [id number] Subsequent splinting/medication, passive range of motion, energy conservation, coping strategies training, patient/family education, and DME and/or AE  instructions  RECOMMENDED OTHER SERVICES: none  CONSULTED AND AGREED WITH PLAN OF CARE: Patient  PLAN FOR NEXT SESSION:   check progress towards goals and and plan to renew.   Cledith Kamiya, OT 11/08/2023, 12:26 PM                     OUTPATIENT OCCUPATIONAL THERAPY ORTHO Treatment  Patient Name: Veronica Galvan MRN: [id number] DOB:05-03-1955, 68 y.o., female Today's Date: 11/08/2023  PCP: Leita Eliza Elbe [name] REFERRING PROVIDER: Leita Eliza Elbe [name]  END OF SESSION:  OT End of Session - 11/08/23 1152     Visit Number 30    Number of Visits 35    Date for OT Re-Evaluation 12/13/23    Authorization Type [employer] MCR    Authorization Time Period 12 weeks    Authorization - Visit Number 30    Progress Note Due on Visit 40    OT Start Time 1403    OT Stop Time 1445    OT Time Calculation (min) 42 min    Activity Tolerance Patient tolerated treatment well    Behavior During Therapy WFL for tasks assessed/performed                          Past Medical History:  Diagnosis Date   Allergy     Anxiety    Arthritis    hands   Cataract    Diabetes mellitus without complication (HCC)    type 2   Family history of adverse reaction to anesthesia    son has malignant hyperthermia, daughter does not daughter recently had c section without problems  GERD (gastroesophageal reflux disease)    Headache    sinus   Hyperlipidemia    Hypertension    PONV (postoperative nausea and vomiting)    nausea only   Ulcer, stomach peptic yrs ago   Past Surgical History:  Procedure Laterality Date   APPENDECTOMY     both hells bone spur repair     both heels with metal clips   both shoulder rotator cuff repair     CESAREAN SECTION     x 1   COLONOSCOPY WITH PROPOFOL  N/A 09/07/2016   Procedure: COLONOSCOPY WITH PROPOFOL ;  Surgeon: Vicci Gladis POUR, MD;  Location: [facility];  Service: Endoscopy;  Laterality: N/A;   colonscopy  06/2011    polyps   ELBOW FRACTURE SURGERY Left    EYE SURGERY     FRACTURE SURGERY     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/10/2022   Procedure: REVERSE SHOULDER ARTHROPLASTY;  Surgeon: Melita Drivers, MD;  Location: [facility];  Service: Orthopedics;  Laterality: Right;  Please follow in room 6 if able   TUBAL LIGATION     VESICO-VAGINAL FISTULA REPAIR     Patient Active Problem List   Diagnosis Date Noted   Gastroesophageal reflux disease 11/16/2021   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 08/18/2021   Dermatofibroma 08/18/2021   History of malignant neoplasm of skin 08/18/2021   Lentigo 08/18/2021   Melanocytic nevi of trunk 08/18/2021   Nevus lipomatosus cutaneous superficialis 08/18/2021   Rosacea 08/18/2021   Actinic keratosis 08/18/2021   Sensorineural hearing loss (SNHL) of left ear with unrestricted hearing of right ear 07/26/2021   Tinnitus of left ear 07/26/2021   Acute recurrent maxillary sinusitis 06/22/2021   Primary hypertension 01/12/2021   Hyperlipidemia associated with type 2 diabetes mellitus (HCC) 01/12/2021   Arthritis 01/12/2021   Type II diabetes mellitus (HCC) 11/25/2019   Ingrown toenail 09/05/2018   Neck pain 01/29/2018   Tick bite 10/24/2017    ONSET DATE: 05/19/23  REFERRING DIAG:  Diagnosis  M79.641,M79.642 (ICD-10-CM) - Bilateral hand pain    THERAPY DIAG:  No diagnosis found.  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 Yes: NPRS scale: 4/10 Pain location: right hand,  left hand Pain description:  aching, stiff Aggravating factors: gripping  Relieving factors: heat, meds Pt also has shoulder pain which is more significant grossly R shoulder 7/10 L shoulder pain 3/10 which OT will not address. PT is addressing shoulder FALLS: Has patient fallen in last 6 months? No  LIVING ENVIRONMENT: Lives with: lives alone Lives in: House/apartment Stairs: yes   PLOF: Independent  PATIENT GOALS: improve functional use of hands and learn adapted strategies for ADLs/IADLs.  NEXT MD VISIT: unknown  OBJECTIVE:  Note: Objective measures were completed at Evaluation unless otherwise noted.  HAND DOMINANCE: Right  ADLs: Overall ADLs: unable to grip credit card to remove from ATM Transfers/ambulation related to ADLs: mod I Eating: drops silverware Grooming: drops toothbrush, uses electric toothbrush, holding comb Upper body dressing: adjusting bra Lower body dressing: difficulty pulling up pants  Toileting: hygeine was difficult initally Bathing: mod  I, difficult with shaving Tub shower transfers: walk in shower    FUNCTIONAL OUTCOME MEASURES: Quick Dash: 05/30/23- 75%% disability Quick Dash 08/15/23- 70.5% disability   UPPER EXTREMITY ROM:   RUE wrist flexion/ extension: 70/ 50, LUE wrist flexion/ extension: 70/ 45 Pt demonstrates grossly 50% composite flexion for right and 555 with left.   Active ROM Right eval Left eval  Thumb MCP (0-60) 45 55  Thumb IP (0-80) 25 10  Thumb Radial abd/add (0-55)     Thumb Palmar abd/add (0-45)     Thumb Opposition to Small Finger yes yes   Index MCP (0-90)     Index PIP (0-100)     Index DIP (0-70)      Long MCP (0-90)      Long PIP (0-100)      Long DIP (0-70)      Ring MCP (0-90)      Ring PIP (0-100)      Ring DIP (0-70)      Little MCP (0-90)      Little PIP (0-100)      Little DIP (0-70)      (Blank rows = not tested) 9 hole peg test (08/15/23) RUE 26.70 secs, LUE 23 secs  HAND FUNCTION: Grip strength: Right: 8 lbs; Left: 4  lbs,          08/15/23-  RUE 18, LUE 14 lbs,           08/15/23   Pinch: RUE tip 4lbs, lateral 8 lbs, LUE tip 3 lbs, lateral 8 lbs   SENSATION: Light touch: Impaired index finger LUE  EDEMA: Pt with bony defomities at PIP joints of all digits which pt reports are bone spurs, Pt also has bony deformity at DIP for right thumb, index and 5th digit and LUE small and index fingers  COGNITION: Overall cognitive status: Within functional limits for tasks assessed   OBSERVATIONS: Pleasant female desires to increase functional use of bilateral UE's  , TREATMENT DATE:10/24/23-Paraffin to RUE x 10 mins, no adverse reactions, while therapist performed US  to left hand 3.3 mhz, 0.8 w/cm 2,  20%x 8 mins no adverse reactions for stiffness. Paraffin to LUE x 10 mins, no adverse reactions, while therapist performed US  to right hand 3.3 mhz, 0.8 w/cm 2,  20%x 8 mins no adverse reactions for stiffness.  Gentle soft tissue and joint mobs/ distraction to digits, hand, wrist bilaterally as welll as right forearm,  Passive finger flexion to bilateral hands. Therpist checked 9 hole peg test, see goals. 10/17/23-Paraffin to RUE x 10 mins, no adverse reactions, while therapist performed US  to left hand 3.3 mhz, 0.8 w/cm 2,  20%x 8 mins no adverse reactions for stiffness. Paraffin to LUE x 10 mins, no adverse reactions, while therapist performed US  to right hand 3.3 mhz, 0.8 w/cm 2,  20%x 8 mins no adverse reactions for stiffness.  Gentle soft tissue and joint mobs/ distraction to digits, hand, wrist bilaterally as welll as right forearm, pt reports  hands and wrists are feeling looser at end of session. Passive composite finger flexion to right hand and then individual finger flexion to left hand . Therapist checked grip strength:  RUE 23 lbs LUE 25 lbs, pt demonstrates bilateral improvements and she reports continuing improving functional use.   10/03/23 Paraffin to RUE x 10 mins, no adverse reactions, while therapist  performed US  to left hand 3.3 mhz, 0.8 w/cm 2,  20%x 8 mins no adverse reactions for stiffness. Paraffin to  LUE x 10 mins, no adverse reactions, while therapist performed US  to right hand 3.3 mhz, 0.8 w/cm 2,  20%x 8 mins no adverse reactions for stiffness.  Passive composite finger flexion to right hand and then individual finger flexion to left hand . Gentle soft tissue and joint mobs/ distraction to digits, hand, wrist bilaterally as welll as right foream, pt reports feeling looser Resistive gripping with  tree squeeze ball left and right UE's digi flex, light resistance for composite and individual finger flexion for LUE   PATIENT EDUCATION: Education details:   see above Person educated: Patient Education method: Explanation, demonstration, v.c Education comprehension: verbalized understanding, returned demonstration  HOME EXERCISE PROGRAM: AROM yellow putty  GOALS: Goals reviewed with patient? Yes  SHORT TERM GOALS: Target date: 09/14/23  I with HEP  Goal status:  met 06/07/23  2.  I with positioning/ splinting prn to minimize pain and defomity   Goal status: met, 07/13/23  3.  Pt will increase bilateral grip strength by 5 lbs for increased UE functional use. Upgraded goal Pt will increase bilateral grip strength to at least 25 lbs (after stretching). Baseline: 05/30/23- RUE 8 lbs, LUE 4 lbs Goal status:met 08/01/23-R 20 lbs, L  19 lbs,  08/15/23- RUE 18 lbsLUE 14 lbs continue to address upgraded goal  - RUE 19 lbs, LUE 24- 10/03/23  RUE 23 lbs LUE 25 lbs, met for LUE, ongoing for right. 10/17/23  4. Pt will demonstrate improved RUE fine motor coordination as evidenced by perfroming 9 hole peg test in 23 secs or less.  Goal status: ongoing 10/17/23, ongoing 26.68- 10/24/23 5. I with updates to HEP.  Goal status: ongoing 10/17/23 6. Pt will demonstrate improved composite finger flexion for ADLs as evidenced by pt. bringing middle fingertip for RUE within 1/2 an inch of  palm.  Baseline: 3/4 inch from palm to middle fingertip  Goal status : ongoing, 3/4 inch 09/19/23, 1 inch from palm 10/24/23 7.Pt will demonstrate improved composite finger flexion for ADLs as evidenced by pt. bringing middle fingertip for LUE within 3/4 an inch of palm.  Baseline: 1 inch from palm to middle finger tip  Goal status: met, 3/4 inch away from middle finger 09/19/23  LONG TERM GOALS: Target date:11/07/23  I with updated HEP  Goal status:met 08/15/22  2.  Pt will improve Quick Dash score to 70% disability Baseline: 75% (05/30/23) Goal status:   improved - however not fully met due to complications from shoulder pain-70.5%08/15/23  3.  Pt will demonstrate at least 65% composte flexion for LUE for increased functional use.  Goal status: met 65-70% , 08/15/23  4.  I with adapted strategies/ adapted equipment to minimize pain and to increase pt I with ADLs/IADLs  Goal status:  met for inital strategies, however continue goal as pt may report additional tasks that can benefit from modification.ongoing 10/03/23  5.  Pt will demonstrate at least 65% composite flexion for RUE for increased functional use.  Goal status: met 70% 08/15/23  6. Pt will report that she is able to cut her meat such as steak or chicken modified independently.  [place]: unable  Goal status: met 09/21/23    7. Pt will report increased ease with opening/ closing ziplock bags.    Goal status: met 09/21/23    8. Pt will improve bilateral tip pinch by 2 lbs for increased ease with daily activities.     Goal status: ongoing 09/19/23    9. Pt will improve bilateral  lateral pinch by 2 lbs for increased ease with ADLs.    Goal status: ongoing 09/19/23   ASSESSMENT: CLINICAL IMPRESSION: Pt reports improved ability to use hands functionally for meal prep during VBS. Today hand pain is 4/10 which is improved overall. PERFORMANCE DEFICITS: in functional skills including ADLs, IADLs, coordination, sensation, edema, ROM,  strength, pain, flexibility, Fine motor control, Gross motor control, endurance, decreased knowledge of precautions, decreased knowledge of use of DME, and UE functional use,, and psychosocial skills including coping strategies, environmental adaptation, habits, interpersonal interactions, and routines and behaviors.   IMPAIRMENTS: are limiting patient from ADLs, IADLs, rest and sleep, education, play, leisure, and social participation.   COMORBIDITIES: may have co-morbidities  that affects occupational performance. Patient will benefit from skilled OT to address above impairments and improve overall function.  MODIFICATION OR ASSISTANCE TO COMPLETE EVALUATION: No modification of tasks or assist necessary to complete an evaluation.  OT OCCUPATIONAL PROFILE AND HISTORY: Detailed assessment: Review of records and additional review of physical, cognitive, psychosocial history related to current functional performance.  CLINICAL DECISION MAKING: LOW - limited treatment options, no task modification necessary  REHAB POTENTIAL: Good  EVALUATION COMPLEXITY: Low      PLAN:  OT FREQUENCY: 2x/week  OT DURATION: 12 weeks( anticipate d/c after 8 weeks dependent on progress)  PLANNED INTERVENTIONS: 97168 OT Re-evaluation, 97535 self care/ADL training, 02889 therapeutic exercise, 97530 therapeutic activity, 97112 neuromuscular re-education, 97140 manual therapy, 97113 aquatic therapy, 97035 ultrasound, 97018 paraffin, 02960 fluidotherapy, 97010 moist heat, 97010 cryotherapy, 97034 contrast bath, 97760 Orthotics management and training, 02239 Splinting (initial encounter), [id number] Subsequent splinting/medication, passive range of motion, energy conservation, coping strategies training, patient/family education, and DME and/or AE instructions  RECOMMENDED OTHER SERVICES: none  CONSULTED AND AGREED WITH PLAN OF CARE: Patient  PLAN FOR NEXT SESSION:   functional use and ROM for bilateral  hands   Ramina Hulet, OT 11/08/2023, 12:26 PM                     OUTPATIENT OCCUPATIONAL THERAPY ORTHO Treatment  Patient Name: Veronica Galvan MRN: [id number] DOB:06/24/1955, 69 y.o., female Today's Date: 11/08/2023  PCP: Leita Eliza Elbe [name] REFERRING PROVIDER: Leita Eliza Elbe [name]  END OF SESSION:  OT End of Session - 11/08/23 1152     Visit Number 30    Number of Visits 35    Date for OT Re-Evaluation 12/13/23    Authorization Type [employer] MCR    Authorization Time Period 12 weeks    Authorization - Visit Number 30    Progress Note Due on Visit 40    OT Start Time 1403    OT Stop Time 1445    OT Time Calculation (min) 42 min    Activity Tolerance Patient tolerated treatment well    Behavior During Therapy WFL for tasks assessed/performed                          Past Medical History:  Diagnosis Date   Allergy     Anxiety    Arthritis    hands   Cataract    Diabetes mellitus without complication (HCC)    type 2   Family history of adverse reaction to anesthesia    son has malignant hyperthermia, daughter does not daughter recently had c section without problems   GERD (gastroesophageal reflux disease)    Headache    sinus   Hyperlipidemia    Hypertension  PONV (postoperative nausea and vomiting)    nausea only   Ulcer, stomach peptic yrs ago   Past Surgical History:  Procedure Laterality Date   APPENDECTOMY     both hells bone spur repair     both heels with metal clips   both shoulder rotator cuff repair     CESAREAN SECTION     x 1   COLONOSCOPY WITH PROPOFOL  N/A 09/07/2016   Procedure: COLONOSCOPY WITH PROPOFOL ;  Surgeon: Vicci Gladis POUR, MD;  Location: [facility];  Service: Endoscopy;  Laterality: N/A;   colonscopy  06/2011   polyps   ELBOW FRACTURE SURGERY Left    EYE SURGERY     FRACTURE SURGERY     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/10/2022   Procedure: REVERSE SHOULDER ARTHROPLASTY;   Surgeon: Melita Drivers, MD;  Location: [facility];  Service: Orthopedics;  Laterality: Right;  Please follow in room 6 if able   TUBAL LIGATION     VESICO-VAGINAL FISTULA REPAIR     Patient Active Problem List   Diagnosis Date Noted   Gastroesophageal reflux disease 11/16/2021   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 08/18/2021   Dermatofibroma 08/18/2021   History of malignant neoplasm of skin 08/18/2021   Lentigo 08/18/2021   Melanocytic nevi of trunk 08/18/2021   Nevus lipomatosus cutaneous superficialis 08/18/2021   Rosacea 08/18/2021   Actinic keratosis 08/18/2021   Sensorineural hearing loss (SNHL) of left ear with unrestricted hearing of right ear 07/26/2021   Tinnitus of left ear 07/26/2021   Acute recurrent maxillary sinusitis 06/22/2021   Primary hypertension 01/12/2021   Hyperlipidemia associated with type 2 diabetes mellitus (HCC) 01/12/2021   Arthritis 01/12/2021   Type II diabetes mellitus (HCC) 11/25/2019   Ingrown toenail 09/05/2018   Neck pain 01/29/2018   Tick bite 10/24/2017    ONSET DATE: 05/19/23  REFERRING DIAG:  Diagnosis  M79.641,M79.642 (ICD-10-CM) - Bilateral hand pain    THERAPY DIAG:  No diagnosis found.  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 Yes: NPRS scale: 7/10- right hand Pain location: right hand,  3/10 left hand Pain description: aching, stiff Aggravating factors: gripping  Relieving factors: heat, meds Pt also has shoulder pain which is more significant grossly R shoulder 8/10 L  shoulder pain 3/10 which OT will not address. PT is addressing shoulder FALLS: Has patient fallen in last 6 months? No  LIVING ENVIRONMENT: Lives with: lives alone Lives in: House/apartment Stairs: yes   PLOF: Independent  PATIENT GOALS: improve functional use of hands and learn adapted strategies for ADLs/IADLs.  NEXT MD VISIT: unknown  OBJECTIVE:  Note: Objective measures were completed at Evaluation unless otherwise noted.  HAND DOMINANCE: Right  ADLs: Overall ADLs: unable to grip credit card to remove from ATM Transfers/ambulation related to ADLs: mod I Eating: drops silverware Grooming: drops toothbrush, uses electric toothbrush, holding comb Upper body dressing: adjusting bra Lower body dressing: difficulty pulling up pants  Toileting: hygeine was difficult initally Bathing: mod I, difficult with shaving Tub shower transfers: walk in shower    FUNCTIONAL OUTCOME MEASURES: Quick Dash: 05/30/23-  75%% disability Quick Dash 08/15/23- 70.5% disability   UPPER EXTREMITY ROM:   RUE wrist flexion/ extension: 70/ 50, LUE wrist flexion/ extension: 70/ 45 Pt demonstrates grossly 50% composite flexion for right and 555 with left.   Active ROM Right eval Left eval  Thumb MCP (0-60) 45 55  Thumb IP (0-80) 25 10  Thumb Radial abd/add (0-55)     Thumb Palmar abd/add (0-45)     Thumb Opposition to Small Finger yes yes   Index MCP (0-90)     Index PIP (0-100)     Index DIP (0-70)      Long MCP (0-90)      Long PIP (0-100)      Long DIP (0-70)      Ring MCP (0-90)      Ring PIP (0-100)      Ring DIP (0-70)      Little MCP (0-90)      Little PIP (0-100)      Little DIP (0-70)      (Blank rows = not tested) 9 hole peg test (08/15/23) RUE 26.70 secs, LUE 23 secs  HAND FUNCTION: Grip strength: Right: 8 lbs; Left: 4 lbs,          08/15/23-  RUE 18, LUE 14 lbs,           08/15/23   Pinch: RUE tip 4lbs, lateral 8 lbs, LUE tip 3 lbs, lateral 8 lbs   SENSATION: Light  touch: Impaired index finger LUE  EDEMA: Pt with bony defomities at PIP joints of all digits which pt reports are bone spurs, Pt also has bony deformity at DIP for right thumb, index and 5th digit and LUE small and index fingers  COGNITION: Overall cognitive status: Within functional limits for tasks assessed   OBSERVATIONS: Pleasant female desires to increase functional use of bilateral UE's  , TREATMENT DATE:Paraffin to RUE x 10 mins, no adverse reactions, while therapist performed US  to left hand 3.3 mhz, 0.8 w/cm 2,  20%x 8 mins no adverse reactions for stiffness. Paraffin to LUE x 10 mins, no adverse reactions, while therapist performed US  to right hand 3.3 mhz, 0.8 w/cm 2,  20%x 8 mins no adverse reactions for stiffness.  Gentle soft tissue and joint mobs/ distraction to digits, hand, wrist bilaterally as welll as right forearm, pt reports  hands and wrists are feeling looser at end of session. Passive composite finger flexion to right hand and then individual finger flexion to left hand . Therapist checked grip strength:  RUE 23 lbs LUE 25 lbs, pt demonstrates bilateral improvements and she reports continuing improving functional use.   10/03/23 Paraffin to RUE x 10 mins, no adverse reactions, while therapist performed US  to left hand 3.3 mhz, 0.8 w/cm 2,  20%x 8 mins no adverse reactions for stiffness. Paraffin to LUE x 10 mins, no adverse reactions, while therapist performed US  to right hand 3.3 mhz, 0.8 w/cm 2,  20%x 8 mins no adverse reactions for stiffness.  Passive composite finger flexion to right hand and then individual finger flexion to left hand . Gentle soft tissue and joint mobs/ distraction to digits, hand, wrist bilaterally as welll as right foream, pt reports feeling looser Resistive gripping with  tree squeeze ball left and right UE's digi flex, light resistance for composite and individual finger flexion for LUE   PATIENT EDUCATION: Education details:   see  above Person educated: Patient Education method: Explanation, demonstration, v.c Education comprehension: verbalized understanding, returned  demonstration  HOME EXERCISE PROGRAM: AROM yellow putty  GOALS: Goals reviewed with patient? Yes  SHORT TERM GOALS: Target date: 09/14/23  I with HEP  Goal status:  met 06/07/23  2.  I with positioning/ splinting prn to minimize pain and defomity   Goal status: met, 07/13/23  3.  Pt will increase bilateral grip strength by 5 lbs for increased UE functional use. Upgraded goal Pt will increase bilateral grip strength to at least 25 lbs (after stretching). Baseline: 05/30/23- RUE 8 lbs, LUE 4 lbs Goal status:met 08/01/23-R 20 lbs, L  19 lbs,  08/15/23- RUE 18 lbsLUE 14 lbs continue to address upgraded goal  - RUE 19 lbs, LUE 24- 10/03/23  RUE 23 lbs LUE 25 lbs, met for LUE, ongoing for right. 10/17/23  4. Pt will demonstrate improved RUE fine motor coordination as evidenced by perfroming 9 hole peg test in 23 secs or less.  Goal status: ongoing 10/17/23 5. I with updates to HEP.  Goal status: ongoing 10/17/23 6. Pt will demonstrate improved composite finger flexion for ADLs as evidenced by pt. bringing middle fingertip for RUE within 1/2 an inch of palm.  Baseline: 3/4 inch from palm to middle fingertip  Goal status : ongoing, 3/4 inch 09/19/23 7.Pt will demonstrate improved composite finger flexion for ADLs as evidenced by pt. bringing middle fingertip for LUE within 3/4 an inch of palm.  Baseline: 1 inch from palm to middle finger tip  Goal status: met, 3/4 inch away from middle finger 09/19/23  LONG TERM GOALS: Target date:11/07/23  I with updated HEP  Goal status:met 08/15/22  2.  Pt will improve Quick Dash score to 70% disability Baseline: 75% (05/30/23) Goal status:   improved - however not fully met due to complications from shoulder pain-70.5%08/15/23  3.  Pt will demonstrate at least 65% composte flexion for LUE for increased functional  use.  Goal status: met 65-70% , 08/15/23  4.  I with adapted strategies/ adapted equipment to minimize pain and to increase pt I with ADLs/IADLs  Goal status:  met for inital strategies, however continue goal as pt may report additional tasks that can benefit from modification.ongoing 10/03/23  5.  Pt will demonstrate at least 65% composite flexion for RUE for increased functional use.  Goal status: met 70% 08/15/23  6. Pt will report that she is able to cut her meat such as steak or chicken modified independently.  [place]: unable  Goal status: met 09/21/23    7. Pt will report increased ease with opening/ closing ziplock bags.    Goal status: met 09/21/23    8. Pt will improve bilateral tip pinch by 2 lbs for increased ease with daily activities.     Goal status: ongoing 09/19/23    9. Pt will improve bilateral lateral pinch by 2 lbs for increased ease with ADLs.    Goal status: ongoing 09/19/23 increased ease rolling dice  ASSESSMENT: CLINICAL IMPRESSION: Pt demonstrates improving flexibility and grip strength.PERFORMANCE DEFICITS: in functional skills including ADLs, IADLs, coordination, sensation, edema, ROM, strength, pain, flexibility, Fine motor control, Gross motor control, endurance, decreased knowledge of precautions, decreased knowledge of use of DME, and UE functional use,, and psychosocial skills including coping strategies, environmental adaptation, habits, interpersonal interactions, and routines and behaviors.   IMPAIRMENTS: are limiting patient from ADLs, IADLs, rest and sleep, education, play, leisure, and social participation.   COMORBIDITIES: may have co-morbidities  that affects occupational performance. Patient will benefit from skilled OT to address above  impairments and improve overall function.  MODIFICATION OR ASSISTANCE TO COMPLETE EVALUATION: No modification of tasks or assist necessary to complete an evaluation.  OT OCCUPATIONAL PROFILE AND HISTORY: Detailed  assessment: Review of records and additional review of physical, cognitive, psychosocial history related to current functional performance.  CLINICAL DECISION MAKING: LOW - limited treatment options, no task modification necessary  REHAB POTENTIAL: Good  EVALUATION COMPLEXITY: Low      PLAN:  OT FREQUENCY: 2x/week  OT DURATION: 12 weeks( anticipate d/c after 8 weeks dependent on progress)  PLANNED INTERVENTIONS: 02831 OT Re-evaluation, 97535 self care/ADL training, 02889 therapeutic exercise, 97530 therapeutic activity, 97112 neuromuscular re-education, 97140 manual therapy, 97113 aquatic therapy, 97035 ultrasound, 97018 paraffin, 02960 fluidotherapy, 97010 moist heat, 97010 cryotherapy, 97034 contrast bath, 97760 Orthotics management and training, 02239 Splinting (initial encounter), [id number] Subsequent splinting/medication, passive range of motion, energy conservation, coping strategies training, patient/family education, and DME and/or AE instructions  RECOMMENDED OTHER SERVICES: none  CONSULTED AND AGREED WITH PLAN OF CARE: Patient  PLAN FOR NEXT SESSION:  check 9 hole peg test, functional use and ROM for bilateral hands   Janina Trafton, OT 11/08/2023, 12:26 PM

## 2023-11-07 NOTE — Telephone Encounter (Signed)
 Forwarding to Dr. Denyse Amass to review and advise.

## 2023-11-08 ENCOUNTER — Encounter: Payer: Self-pay | Admitting: Family Medicine

## 2023-11-09 ENCOUNTER — Ambulatory Visit: Admitting: Physical Therapy

## 2023-11-09 DIAGNOSIS — M79641 Pain in right hand: Secondary | ICD-10-CM | POA: Diagnosis not present

## 2023-11-09 DIAGNOSIS — R252 Cramp and spasm: Secondary | ICD-10-CM | POA: Diagnosis not present

## 2023-11-09 DIAGNOSIS — M25511 Pain in right shoulder: Secondary | ICD-10-CM | POA: Diagnosis not present

## 2023-11-09 DIAGNOSIS — M25611 Stiffness of right shoulder, not elsewhere classified: Secondary | ICD-10-CM

## 2023-11-09 DIAGNOSIS — M6281 Muscle weakness (generalized): Secondary | ICD-10-CM | POA: Diagnosis not present

## 2023-11-09 DIAGNOSIS — R278 Other lack of coordination: Secondary | ICD-10-CM | POA: Diagnosis not present

## 2023-11-09 DIAGNOSIS — M79642 Pain in left hand: Secondary | ICD-10-CM | POA: Diagnosis not present

## 2023-11-09 DIAGNOSIS — G8929 Other chronic pain: Secondary | ICD-10-CM | POA: Diagnosis not present

## 2023-11-09 DIAGNOSIS — M25642 Stiffness of left hand, not elsewhere classified: Secondary | ICD-10-CM | POA: Diagnosis not present

## 2023-11-09 DIAGNOSIS — M542 Cervicalgia: Secondary | ICD-10-CM | POA: Diagnosis not present

## 2023-11-09 DIAGNOSIS — M25641 Stiffness of right hand, not elsewhere classified: Secondary | ICD-10-CM | POA: Diagnosis not present

## 2023-11-09 NOTE — Therapy (Signed)
 OUTPATIENT PHYSICAL THERAPY SHOULDER    Patient Name: Veronica Galvan MRN: [id number] DOB:[birth year], 68 y.o., female Today's Date: 11/09/2023  END OF SESSION:  PT End of Session - 11/09/23 0800     Visit Number 81    Date for PT Re-Evaluation 12/07/23    Authorization Type [employer] [place]    PT Start Time 0800    PT Stop Time 0900    PT Time Calculation (min) 60 min          Past Medical History:  Diagnosis Date   Allergy     Anxiety    Arthritis    hands   Cataract    Diabetes mellitus without complication (HCC)    type 2   Family history of adverse reaction to anesthesia    son has malignant hyperthermia, daughter does not daughter recently had c section without problems   GERD (gastroesophageal reflux disease)    Headache    sinus   Hyperlipidemia    Hypertension    PONV (postoperative nausea and vomiting)    nausea only   Ulcer, stomach peptic yrs ago   Past Surgical History:  Procedure Laterality Date   APPENDECTOMY     both hells bone spur repair     both heels with metal clips   both shoulder rotator cuff repair     CESAREAN SECTION     x 1   COLONOSCOPY WITH PROPOFOL  N/A 09/07/2016   Procedure: COLONOSCOPY WITH PROPOFOL ;  Surgeon: Vicci Gladis POUR, MD;  Location: [facility];  Service: Endoscopy;  Laterality: N/A;   colonscopy  06/2011   polyps   ELBOW FRACTURE SURGERY Left    EYE SURGERY     FRACTURE SURGERY     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/10/2022   Procedure: REVERSE SHOULDER ARTHROPLASTY;  Surgeon: Melita Drivers, MD;  Location: [facility];  Service: Orthopedics;  Laterality: Right;  Please follow in room 6 if able   TUBAL LIGATION     VESICO-VAGINAL FISTULA REPAIR     Patient Active Problem List   Diagnosis Date Noted   Gastroesophageal reflux disease 11/16/2021   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 08/18/2021   Dermatofibroma 08/18/2021   History of malignant neoplasm of skin 08/18/2021   Lentigo  08/18/2021   Melanocytic nevi of trunk 08/18/2021   Nevus lipomatosus cutaneous superficialis 08/18/2021   Rosacea 08/18/2021   Actinic keratosis 08/18/2021   Sensorineural hearing loss (SNHL) of left ear with unrestricted hearing of right ear 07/26/2021   Tinnitus of left ear 07/26/2021   Acute recurrent maxillary sinusitis 06/22/2021   Primary hypertension 01/12/2021   Hyperlipidemia associated with type 2 diabetes mellitus (HCC) 01/12/2021   Arthritis 01/12/2021   Type II diabetes mellitus (HCC) 11/25/2019   Ingrown toenail 09/05/2018   Neck pain 01/29/2018   Tick bite 10/24/2017    PCP: [name], [name]  REFERRING PROVIDER: Melita, MD  REFERRING DIAG: s/p right reverse TSA  THERAPY DIAG:  Muscle weakness (generalized)  Chronic right shoulder pain  Stiffness of right shoulder, not elsewhere classified  Rationale for Evaluation and Treatment: Rehabilitation  ONSET DATE: 11/05/22  SUBJECTIVE:  SUBJECTIVE STATEMENT:   I am over this not any better. Getting inj Left shld next week.  Patient reports that she saw the surgeon she is frustrated and upset as she feels that she still has no answers, it is felt that there is no loosening and no infection.  She is still hurting with basic ADL's, her AROM decreased beginning in March, as she started having increased pain in mid January after a very busy and active few month, she has an order for the left shoulder and the okay for us  to see her for her right shoulder for another month PAIN:  Are you having pain? Yes: NPRS scale: 5/10 Pain location: right shoulder, HA Pain description: dull ache at rest, sharp with motions Aggravating factors: quick motions, movements pain up to 10/10 Relieving factors: ice, rest, Tylenol  at best 3/10  PRECAUTIONS: Shoulder  Protocol in the chart  RED FLAGS: None   WEIGHT BEARING RESTRICTIONS: No  FALLS:  Has patient fallen in last 6 months? Yes. Number of falls 1  LIVING ENVIRONMENT: Lives with: lives alone Lives in: House/apartment Stairs: No Has following equipment at home: None  OCCUPATION: retired  PLOF: Independent  PATIENT GOALS:dress without difficulty, do hair, have good ROM and less pain  NEXT MD VISIT:   OBJECTIVE:   DIAGNOSTIC FINDINGS:  See above  PATIENT SURVEYS:  FOTO 21  COGNITION: Overall cognitive status: Within functional limits for tasks assessed     SENSATION: WFL  POSTURE: Fwd head, rounded shoulders, elevated and guarded shoulder  UPPER EXTREMITY ROM:   Active ROM Right PROM eval Left AROM  08/31/23 PROM Supine 01/05/23 PROM  01/26/23 PROM 02/09/23 AROM sitting 02/14/23 AROM 02/21/23 AROM  03/02/23 AROM  03/22/23 AAROM 03/28/23 AROM Standing 04/13/23 AROM Standing 05/02/23 AROM  Standing 05/11/23 AROM  Standing 05/25/23 AROM Standing 06/01/23 AROM Standing  07/06/23 AROM  Standing 07/27/23 AROM Standing 08/31/23 AROM Standing  10/03/23 AROM 10/19/23 AROM 11/01/23  Shoulder flexion 35 90 118  123 90 96 95 100 111 110 112 120 125  129 132 130 105 115 110 R 120 L 135  Shoulder extension 5                      Shoulder abduction 20 95 90 92  73 80 83 90 93 90 92 98 100 102  105 95 81 90  R 97 L 130  Shoulder adduction                       Shoulder internal rotation 15 35            40 40  42 25   R 28 L32  Shoulder external rotation 20 65 45 30  41  45 50 53 56 56 60 66 67 70 70  47 60  R 45 L 75  Elbow flexion 120                      Elbow extension 5                      Wrist flexion                       Wrist extension                       Wrist ulnar deviation  Wrist radial deviation                       Wrist pronation                       Wrist supination                       (Blank rows = not tested)  UPPER  EXTREMITY MMT:  No tested due to recent surgery  MMT Right eval Left eval  Shoulder flexion    Shoulder extension    Shoulder abduction    Shoulder adduction    Shoulder internal rotation    Shoulder external rotation    Middle trapezius    Lower trapezius    Elbow flexion    Elbow extension    Wrist flexion    Wrist extension    Wrist ulnar deviation    Wrist radial deviation    Wrist pronation    Wrist supination    Grip strength (lbs)    (Blank rows = not tested)  PALPATION:  Very tight and tender in the pectoral, upper trap, the entire right upper arm   TODAY'S TREATMENT:                                                                                                                                         DATE:   11/09/23 Nustep L 4 15# seated row 2 sets 10 Chest Press 5# 2 sets 10 Tricep ext 20# 2 sets 10 Bicep 5# 2 sets 10 2# cane ex for ROM and strength standing Gentle PROM BIL shld STM to BIL cerv and shlds VASO     11/07/23 Nustep L 4 15# seated row 2 sets 10 Chest Press 5# 2 sets 10 US  left shld STM to the left upper trap and neck. Left shld gentle PROM Ktape X over large trigger point right upper trap and then two I strips to support shoulder Vaso low pressure 34 degrees  11/01/23 Niustep level 4 x 4 minutes 10# seated row Chest press 5# Wall slides/circles STM to the right upper trap and neck Ktape X over large trigger point right upper trap and then two I strips to support shoulder Vaso low pressure 34 degrees  10/31/23 STM to the left UE Passive stretch to bilateral UE's all motions some holds and some contract relax, needs a lot of cues to relax, slow focused stretches today Vaso to bilateral shoulders  10/26/23 UBE 5 minutes Red tand row and extension Ball roll ups Ball vs wall 2 different positions  20# triceps STM to the left shoulder and neck Some passive stretch to bilateral pecs and biceps Vaso low pressure 34  degrees  10/25/23 UBE level 2 x 4 minutes 10# row 2x10 5# biceps  15# triceps Red tband IR  Yellow tband ER Supine 2# punches with PT limiting the ROM Supine 2# serratus 2# isometric circles 1# small ROM ER/IR supine Gentle PROM avoiding pain Vaso 34 degrees low pressure  10/19/23 Discussion of the plan the surgeon gave her and the plan that I feel we will do, we measured the ROM and looked at anatomy and what the MD notes say as she had a lot of questions. See plan below Some STM to the right upper trap  10/17/23 Gentle stretch of the neck and shoulders STM to the bilateral upper traps, rhomboids, teres and right pectoral mms Vaso to the right shoulder 35 degrees medium pressure  10/12/23 UBE level 4 x 6 minutes Passive stretch to bilateral shoulders STM to the left shoulder/upper arm STM to the right upper trap, rhomboid, neck and teres. Vaso medium pressure 34 degrees  10/10/23 UBE level 3 x 6 minutes PROM right shoulder to her tolerance all motions STM to the right upper trap, neck and rhomboid Vaso right shoulder medium pressure 34 degrees  10/05/23 UBE level 4 x 5 minutes Supine chest presses Supine flexion with 2# wate bar Passive stretch gentle right shoulder STM to the right upper trap and neck Vaso medium pressure  10/03/23 UBE level 3 x 6 minutes Supine wand chest press Supine wand flexion Passive stretch right shoulder  STM to the right upper arm and pectoral area Vaso 34 degrees right shoulder ROM measured as above PATIENT EDUCATION: Education details: poc/hep Person educated: Patient Education method: Programmer, multimedia, Facilities manager, Actor cues, Verbal cues, and Handouts Education comprehension: verbalized understanding  HOME EXERCISE PROGRAM: Access Code: [email or web address] URL: [email or web address] Date: 12/20/2022 Prepared by: Ozell Mainland  Exercises - Seated Shoulder Flexion Towel Slide at Table Top  - 2 x daily - 7 x weekly - 2 sets -  10 reps - 3 hold - Seated Shoulder External Rotation PROM on Table  - 2 x daily - 7 x weekly - 2 sets - 10 reps - 3 hold - Seated Elbow Extension and Shoulder External Rotation AAROM at Table with Towel  - 2 x daily - 7 x weekly - 2 sets - 10 reps - 3 hold - Circular Shoulder Pendulum with Table Support  - 2 x daily - 7 x weekly - 2 sets - 10 reps - 3 hold  Access Code: [email or web address] URL: [email or web address] Date: 07/13/2023 Prepared by: Ozell Mainland  Exercises - Isometric Shoulder Flexion at Wall  - 1 x daily - 7 x weekly - 1 sets - 10 reps - 3 hold - Standing Isometric Shoulder Internal Rotation at Doorway  - 1 x daily - 7 x weekly - 1 sets - 10 reps - 3 hold - Isometric Shoulder Extension at Wall  - 1 x daily - 7 x weekly - 1 sets - 10 reps - 3 hold - Isometric Shoulder Abduction at Wall  - 1 x daily - 7 x weekly - 1 sets - 10 reps - 3 hold - Standing Isometric Shoulder External Rotation with Doorway  - 1 x daily - 7 x weekly - 1 sets - 10 reps - 3 hold  ASSESSMENT:  CLINICAL IMPRESSION:  Continue to  slowly try to return to light exercises as tolerated. Pt states no better and PT currently not helping. Seeing MD next week for inj LEft shld. Took tape off and rec leaving off  to let sink breathe over weekend.. No goal progress.  Patient saw surgeon, the bone scan said did not feel that there  was infection or loosening however the surgeon is not so sure, he wants to do an US  to be sure, that is scheduled for the next few weeks, since she has not been to PT in a month I re-measured her ROM, she has lost at least 15 degrees of motion and more for all motions.  She is also having more pain in the left shoulder possibly from overdoing it, because she is trying to not use the right shoulder.  She has talked about this with her primary and I suggested that she see a sports medicine MD, she called while she was here and will have appointment with him next week.  As far as the right  shoulder the Surgeon stated in his notes to resume formal PT on a conservative basis to help with ROM and pain until the US  is performed   OBJECTIVE IMPAIRMENTS: cardiopulmonary status limiting activity, decreased activity tolerance, decreased endurance, decreased ROM, decreased strength, increased edema, increased muscle spasms, impaired flexibility, impaired UE functional use, improper body mechanics, postural dysfunction, and pain.  REHAB POTENTIAL: Good  CLINICAL DECISION MAKING: Evolving/moderate complexity  EVALUATION COMPLEXITY: Low   GOALS: Goals reviewed with patient? Yes  SHORT TERM GOALS: Target date: 01/01/23  Independent with initial HEP Goal status: 12/27/22 MET  LONG TERM GOALS: Target date: 03/22/23  Decrease pain 50% Goal status: progressing 11/01/23  2.  Dress without difficulty Goal status: progressing  11/01/23  3.  Do hair without difficulty Goal status: able to reach the top of head at times progressing 2/20/253/25/25 4.  Increase AROM right shoulder flexion to 130 degrees Goal status: 07/25/23 progressing 130 degrees with pain, since March has regressed but is better since march 11/01/23  5.  Increase right shoulder ER to 60 degrees Goal status: met 07/06/23  6.  Return to water  aerobics and or gym activity Goal status:progressing met 06/01/23  PLAN:  PT FREQUENCY: 1-2x/week  PT DURATION: 12 weeks  PLANNED INTERVENTIONS: Therapeutic exercises, Therapeutic activity, Neuromuscular re-education, Balance training, Gait training, Patient/Family education, Self Care, Joint mobilization, Dry Needling, Electrical stimulation, Cryotherapy, Vasopneumatic device, and Manual therapy  PLAN FOR NEXT SESSION: assess and prepare for D/C    11/09/2023, 8:02 AM [facility] [address] [facility] [place], [place], [address] Phone: [phone]   Fax:  [phone]

## 2023-11-13 ENCOUNTER — Encounter: Payer: Self-pay | Admitting: Physical Therapy

## 2023-11-13 ENCOUNTER — Ambulatory Visit: Admitting: Physical Therapy

## 2023-11-13 DIAGNOSIS — M25611 Stiffness of right shoulder, not elsewhere classified: Secondary | ICD-10-CM | POA: Diagnosis not present

## 2023-11-13 DIAGNOSIS — R252 Cramp and spasm: Secondary | ICD-10-CM | POA: Diagnosis not present

## 2023-11-13 DIAGNOSIS — G8929 Other chronic pain: Secondary | ICD-10-CM | POA: Diagnosis not present

## 2023-11-13 DIAGNOSIS — M25642 Stiffness of left hand, not elsewhere classified: Secondary | ICD-10-CM | POA: Diagnosis not present

## 2023-11-13 DIAGNOSIS — M25641 Stiffness of right hand, not elsewhere classified: Secondary | ICD-10-CM | POA: Diagnosis not present

## 2023-11-13 DIAGNOSIS — M6281 Muscle weakness (generalized): Secondary | ICD-10-CM

## 2023-11-13 DIAGNOSIS — M542 Cervicalgia: Secondary | ICD-10-CM

## 2023-11-13 DIAGNOSIS — M79642 Pain in left hand: Secondary | ICD-10-CM | POA: Diagnosis not present

## 2023-11-13 DIAGNOSIS — M25511 Pain in right shoulder: Secondary | ICD-10-CM | POA: Diagnosis not present

## 2023-11-13 DIAGNOSIS — M79641 Pain in right hand: Secondary | ICD-10-CM | POA: Diagnosis not present

## 2023-11-13 DIAGNOSIS — R278 Other lack of coordination: Secondary | ICD-10-CM | POA: Diagnosis not present

## 2023-11-13 NOTE — Therapy (Signed)
 OUTPATIENT PHYSICAL THERAPY SHOULDER    Patient Name: Veronica Galvan MRN: [id number] DOB:05/28/1955, 68 y.o., female Today's Date: 11/13/2023  END OF SESSION:  PT End of Session - 11/13/23 1614     Visit Number 82    Date for PT Re-Evaluation 12/07/23    Authorization Type [employer] [place]    PT Start Time 1610    PT Stop Time 1710    PT Time Calculation (min) 60 min    Activity Tolerance Patient tolerated treatment well    Behavior During Therapy WFL for tasks assessed/performed          Past Medical History:  Diagnosis Date   Allergy     Anxiety    Arthritis    hands   Cataract    Diabetes mellitus without complication (HCC)    type 2   Family history of adverse reaction to anesthesia    son has malignant hyperthermia, daughter does not daughter recently had c section without problems   GERD (gastroesophageal reflux disease)    Headache    sinus   Hyperlipidemia    Hypertension    PONV (postoperative nausea and vomiting)    nausea only   Ulcer, stomach peptic yrs ago   Past Surgical History:  Procedure Laterality Date   APPENDECTOMY     both hells bone spur repair     both heels with metal clips   both shoulder rotator cuff repair     CESAREAN SECTION     x 1   COLONOSCOPY WITH PROPOFOL  N/A 09/07/2016   Procedure: COLONOSCOPY WITH PROPOFOL ;  Surgeon: Vicci Gladis POUR, MD;  Location: [facility];  Service: Endoscopy;  Laterality: N/A;   colonscopy  06/2011   polyps   ELBOW FRACTURE SURGERY Left    EYE SURGERY     FRACTURE SURGERY     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/10/2022   Procedure: REVERSE SHOULDER ARTHROPLASTY;  Surgeon: Melita Drivers, MD;  Location: [facility];  Service: Orthopedics;  Laterality: Right;  Please follow in room 6 if able   TUBAL LIGATION     VESICO-VAGINAL FISTULA REPAIR     Patient Active Problem List   Diagnosis Date Noted   Gastroesophageal reflux disease 11/16/2021   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 08/18/2021   Dermatofibroma 08/18/2021   History of malignant neoplasm of skin 08/18/2021   Lentigo 08/18/2021   Melanocytic nevi of trunk 08/18/2021   Nevus lipomatosus cutaneous superficialis 08/18/2021   Rosacea 08/18/2021   Actinic keratosis 08/18/2021   Sensorineural hearing loss (SNHL) of left ear with unrestricted hearing of right ear 07/26/2021   Tinnitus of left ear 07/26/2021   Acute recurrent maxillary sinusitis 06/22/2021   Primary hypertension 01/12/2021   Hyperlipidemia associated with type 2 diabetes mellitus (HCC) 01/12/2021   Arthritis 01/12/2021   Type II diabetes mellitus (HCC) 11/25/2019   Ingrown toenail 09/05/2018   Neck pain 01/29/2018   Tick bite 10/24/2017    PCP: [name], [name]  REFERRING PROVIDER: Melita, MD  REFERRING DIAG: s/p right reverse TSA  THERAPY DIAG:  Muscle weakness (generalized)  Chronic right shoulder pain  Stiffness of right shoulder, not elsewhere classified  Spasm  Cervicalgia  Rationale for Evaluation and Treatment: Rehabilitation  ONSET DATE: 11/05/22  SUBJECTIVE:  SUBJECTIVE STATEMENT:  I just still hurt all the time.  I hope to get an appointment for an injection in the shoulder  Patient reports that she saw the surgeon she is frustrated and upset as she feels that she still has no answers, it is felt that there is no loosening and no infection.  She is still hurting with basic ADL's, her AROM decreased beginning in March, as she started having increased pain in mid January after a very busy and active few month, she has an order for the left shoulder and the okay for us  to see her for her right shoulder for another month PAIN:  Are you having pain? Yes: NPRS scale: 5/10 Pain location: right shoulder, HA Pain description: dull ache at  rest, sharp with motions Aggravating factors: quick motions, movements pain up to 10/10 Relieving factors: ice, rest, Tylenol  at best 3/10  PRECAUTIONS: Shoulder Protocol in the chart  RED FLAGS: None   WEIGHT BEARING RESTRICTIONS: No  FALLS:  Has patient fallen in last 6 months? Yes. Number of falls 1  LIVING ENVIRONMENT: Lives with: lives alone Lives in: House/apartment Stairs: No Has following equipment at home: None  OCCUPATION: retired  PLOF: Independent  PATIENT GOALS:dress without difficulty, do hair, have good ROM and less pain  NEXT MD VISIT:   OBJECTIVE:   DIAGNOSTIC FINDINGS:  See above  PATIENT SURVEYS:  FOTO 21  COGNITION: Overall cognitive status: Within functional limits for tasks assessed     SENSATION: WFL  POSTURE: Fwd head, rounded shoulders, elevated and guarded shoulder  UPPER EXTREMITY ROM:   Active ROM Right PROM eval Left AROM  08/31/23 PROM Supine 01/05/23 PROM  01/26/23 PROM 02/09/23 AROM sitting 02/14/23 AROM 02/21/23 AROM  03/02/23 AROM  03/22/23 AAROM 03/28/23 AROM Standing 04/13/23 AROM Standing 05/02/23 AROM  Standing 05/11/23 AROM  Standing 05/25/23 AROM Standing 06/01/23 AROM Standing  07/06/23 AROM  Standing 07/27/23 AROM Standing 08/31/23 AROM Standing  10/03/23 AROM 10/19/23 AROM 11/01/23  Shoulder flexion 35 90 118  123 90 96 95 100 111 110 112 120 125  129 132 130 105 115 110 R 120 L 135  Shoulder extension 5                      Shoulder abduction 20 95 90 92  73 80 83 90 93 90 92 98 100 102  105 95 81 90  R 97 L 130  Shoulder adduction                       Shoulder internal rotation 15 35            40 40  42 25   R 28 L32  Shoulder external rotation 20 65 45 30  41  45 50 53 56 56 60 66 67 70 70  47 60  R 45 L 75  Elbow flexion 120                      Elbow extension 5                      Wrist flexion                       Wrist extension                       Wrist ulnar deviation  Wrist radial deviation                       Wrist pronation                       Wrist supination                       (Blank rows = not tested)  UPPER EXTREMITY MMT:  No tested due to recent surgery  MMT Right eval Left eval  Shoulder flexion    Shoulder extension    Shoulder abduction    Shoulder adduction    Shoulder internal rotation    Shoulder external rotation    Middle trapezius    Lower trapezius    Elbow flexion    Elbow extension    Wrist flexion    Wrist extension    Wrist ulnar deviation    Wrist radial deviation    Wrist pronation    Wrist supination    Grip strength (lbs)    (Blank rows = not tested)  PALPATION:  Very tight and tender in the pectoral, upper trap, the entire right upper arm   TODAY'S TREATMENT:                                                                                                                                         DATE:  11/13/23 Nustep level 5 x 6 minutes 15# row 20# triceps 5# biceps Wall slides and circles AAROM with stick STM to the left shoulder and the right upper trap Ktape to the right shoulder for support   11/09/23 Nustep L 4 15# seated row 2 sets 10 Chest Press 5# 2 sets 10 Tricep ext 20# 2 sets 10 Bicep 5# 2 sets 10 2# cane ex for ROM and strength standing Gentle PROM BIL shld STM to BIL cerv and shlds VASO     11/07/23 Nustep L 4 15# seated row 2 sets 10 Chest Press 5# 2 sets 10 US  left shld STM to the left upper trap and neck. Left shld gentle PROM Ktape X over large trigger point right upper trap and then two I strips to support shoulder Vaso low pressure 34 degrees  11/01/23 Niustep level 4 x 4 minutes 10# seated row Chest press 5# Wall slides/circles STM to the right upper trap and neck Ktape X over large trigger point right upper trap and then two I strips to support shoulder Vaso low pressure 34 degrees  10/31/23 STM to the left UE Passive stretch to bilateral  UE's all motions some holds and some contract relax, needs a lot of cues to relax, slow focused stretches today Vaso to bilateral shoulders  10/26/23 UBE 5 minutes Red tand row and extension Ball roll ups Ball vs wall 2 different positions  20# triceps STM to  the left shoulder and neck Some passive stretch to bilateral pecs and biceps Vaso low pressure 34 degrees  10/25/23 UBE level 2 x 4 minutes 10# row 2x10 5# biceps  15# triceps Red tband IR Yellow tband ER Supine 2# punches with PT limiting the ROM Supine 2# serratus 2# isometric circles 1# small ROM ER/IR supine Gentle PROM avoiding pain Vaso 34 degrees low pressure  10/19/23 Discussion of the plan the surgeon gave her and the plan that I feel we will do, we measured the ROM and looked at anatomy and what the MD notes say as she had a lot of questions. See plan below Some STM to the right upper trap  10/17/23 Gentle stretch of the neck and shoulders STM to the bilateral upper traps, rhomboids, teres and right pectoral mms Vaso to the right shoulder 35 degrees medium pressure  10/12/23 UBE level 4 x 6 minutes Passive stretch to bilateral shoulders STM to the left shoulder/upper arm STM to the right upper trap, rhomboid, neck and teres. Vaso medium pressure 34 degrees  10/10/23 UBE level 3 x 6 minutes PROM right shoulder to her tolerance all motions STM to the right upper trap, neck and rhomboid Vaso right shoulder medium pressure 34 degrees  10/05/23 UBE level 4 x 5 minutes Supine chest presses Supine flexion with 2# wate bar Passive stretch gentle right shoulder STM to the right upper trap and neck Vaso medium pressure  10/03/23 UBE level 3 x 6 minutes Supine wand chest press Supine wand flexion Passive stretch right shoulder  STM to the right upper arm and pectoral area Vaso 34 degrees right shoulder ROM measured as above PATIENT EDUCATION: Education details: poc/hep Person educated: Patient Education  method: Programmer, multimedia, Facilities manager, Actor cues, Verbal cues, and Handouts Education comprehension: verbalized understanding  HOME EXERCISE PROGRAM: Access Code: [email or web address] URL: [email or web address] Date: 12/20/2022 Prepared by: Ozell Mainland  Exercises - Seated Shoulder Flexion Towel Slide at Table Top  - 2 x daily - 7 x weekly - 2 sets - 10 reps - 3 hold - Seated Shoulder External Rotation PROM on Table  - 2 x daily - 7 x weekly - 2 sets - 10 reps - 3 hold - Seated Elbow Extension and Shoulder External Rotation AAROM at Table with Towel  - 2 x daily - 7 x weekly - 2 sets - 10 reps - 3 hold - Circular Shoulder Pendulum with Table Support  - 2 x daily - 7 x weekly - 2 sets - 10 reps - 3 hold  Access Code: [email or web address] URL: [email or web address] Date: 07/13/2023 Prepared by: Ozell Mainland  Exercises - Isometric Shoulder Flexion at Wall  - 1 x daily - 7 x weekly - 1 sets - 10 reps - 3 hold - Standing Isometric Shoulder Internal Rotation at Doorway  - 1 x daily - 7 x weekly - 1 sets - 10 reps - 3 hold - Isometric Shoulder Extension at Wall  - 1 x daily - 7 x weekly - 1 sets - 10 reps - 3 hold - Isometric Shoulder Abduction at Wall  - 1 x daily - 7 x weekly - 1 sets - 10 reps - 3 hold - Standing Isometric Shoulder External Rotation with Doorway  - 1 x daily - 7 x weekly - 1 sets - 10 reps - 3 hold  ASSESSMENT:  CLINICAL IMPRESSION:  Continue to  slowly try to return to light exercises as tolerated. Pt states no better and PT currently not helping. Will  get injection later this week and then will be on vacation and we will see her after that and need to get HEP vs what she can and will do  Patient saw surgeon, the bone scan said did not feel that there was infection or loosening however the surgeon is not so sure, he wants to do an US  to be sure, that is scheduled for the next few weeks, since she has not been to PT in a month I re-measured her ROM, she has  lost at least 15 degrees of motion and more for all motions.  She is also having more pain in the left shoulder possibly from overdoing it, because she is trying to not use the right shoulder.  She has talked about this with her primary and I suggested that she see a sports medicine MD, she called while she was here and will have appointment with him next week.  As far as the right shoulder the Surgeon stated in his notes to resume formal PT on a conservative basis to help with ROM and pain until the US  is performed   OBJECTIVE IMPAIRMENTS: cardiopulmonary status limiting activity, decreased activity tolerance, decreased endurance, decreased ROM, decreased strength, increased edema, increased muscle spasms, impaired flexibility, impaired UE functional use, improper body mechanics, postural dysfunction, and pain.  REHAB POTENTIAL: Good  CLINICAL DECISION MAKING: Evolving/moderate complexity  EVALUATION COMPLEXITY: Low   GOALS: Goals reviewed with patient? Yes  SHORT TERM GOALS: Target date: 01/01/23  Independent with initial HEP Goal status: 12/27/22 MET  LONG TERM GOALS: Target date: 03/22/23  Decrease pain 50% Goal status: progressing 11/01/23  2.  Dress without difficulty Goal status: progressing  11/01/23  3.  Do hair without difficulty Goal status: able to reach the top of head at times progressing 2/20/253/25/25 4.  Increase AROM right shoulder flexion to 130 degrees Goal status: 07/25/23 progressing 130 degrees with pain, since March has regressed but is better since march 11/01/23  5.  Increase right shoulder ER to 60 degrees Goal status: met 07/06/23  6.  Return to water  aerobics and or gym activity Goal status:progressing met 06/01/23  PLAN:  PT FREQUENCY: 1-2x/week  PT DURATION: 12 weeks  PLANNED INTERVENTIONS: Therapeutic exercises, Therapeutic activity, Neuromuscular re-education, Balance training, Gait training, Patient/Family education, Self Care, Joint mobilization, Dry  Needling, Electrical stimulation, Cryotherapy, Vasopneumatic device, and Manual therapy  PLAN FOR NEXT SESSION: assess and prepare for D/C    11/13/2023, 4:15 PM [facility] [address] [facility] [place], [place], [address] Phone: [phone]   Fax:  [phone]

## 2023-11-14 ENCOUNTER — Encounter: Payer: Self-pay | Admitting: Podiatry

## 2023-11-14 ENCOUNTER — Ambulatory Visit: Admitting: Podiatry

## 2023-11-14 VITALS — BP 134/80 | HR 94 | Temp 97.6°F | Resp 18 | Ht 61.0 in | Wt 191.0 lb

## 2023-11-14 DIAGNOSIS — G5762 Lesion of plantar nerve, left lower limb: Secondary | ICD-10-CM | POA: Diagnosis not present

## 2023-11-14 DIAGNOSIS — E119 Type 2 diabetes mellitus without complications: Secondary | ICD-10-CM | POA: Diagnosis not present

## 2023-11-14 NOTE — Patient Instructions (Signed)

## 2023-11-15 ENCOUNTER — Other Ambulatory Visit: Payer: Self-pay

## 2023-11-15 ENCOUNTER — Ambulatory Visit: Admitting: Family Medicine

## 2023-11-15 VITALS — BP 166/90 | HR 96 | Ht 61.0 in

## 2023-11-15 DIAGNOSIS — M25512 Pain in left shoulder: Secondary | ICD-10-CM | POA: Diagnosis not present

## 2023-11-15 DIAGNOSIS — G8929 Other chronic pain: Secondary | ICD-10-CM

## 2023-11-15 DIAGNOSIS — M25511 Pain in right shoulder: Secondary | ICD-10-CM | POA: Diagnosis not present

## 2023-11-15 DIAGNOSIS — K080 Exfoliation of teeth due to systemic causes: Secondary | ICD-10-CM | POA: Diagnosis not present

## 2023-11-15 NOTE — Progress Notes (Signed)
 LILLETTE Ileana Collet, PhD, LAT, ATC acting as a scribe for Artist Lloyd, MD.  Veronica Galvan is a 68 y.o. female who presents to [facility] today for exacerbation of her L shoulder pain. Pt was last seen by Dr. Lloyd on 09/06/23 and was given a L subacromial steroid injection and was referred to PT.  Today, pt reports steroid injection in May only lasted for about 4 wks. L shoulder is very painful today. She notes she is leaving for a big family vacation, leaving Saturday.  She has chronic right shoulder pain.  She had a comminuted and articular fracture of the right humeral head neck July 2020 for surgically repaired by Dr. Melita.  She has continued pain and has had multiple interventions with Dr. Melita including a three-phase bone scan in April 2025 that showed a little bit of uptake thought to be within normal limits following arthroplasty no evidence of loosening or infection.  She has recently done a fair amount of physical therapy for this and still has pain.  She is interested in a second opinion.  Dx imaging: 09/06/23 L shoulder XR  Pertinent review of systems: No fevers or chills  Relevant historical information: Hypertension and diabetes.  Right shoulder surgery.   Exam:  BP (!) 166/90   Pulse 96   Ht 5' 1 (1.549 m)   SpO2 96%   BMI 36.09 kg/m  General: Well Developed, well nourished, and in no acute distress.   MSK: Left shoulder mature scar superior shoulder normal motion pain with abduction intact strength.  Right shoulder reduced range of motion.    Lab and Radiology Results  Procedure: Real-time Ultrasound Guided Injection of left shoulder subacromial bursa Device: Philips Affiniti 50G/GE Logiq Images permanently stored and available for review in PACS Verbal informed consent obtained.  Discussed risks and benefits of procedure. Warned about infection, bleeding, hyperglycemia damage to structures among others. Patient expresses  understanding and agreement Time-out conducted.   Noted no overlying erythema, induration, or other signs of local infection.   Skin prepped in a sterile fashion.   Local anesthesia: Topical Ethyl chloride.   With sterile technique and under real time ultrasound guidance: 40 mg of Kenalog  and 2 mL's of Marcaine  injected into left shoulder subacromial bursa. Fluid seen entering the bursa.   Completed without difficulty   Pain immediately resolved suggesting accurate placement of the medication.   Advised to call if fevers/chills, erythema, induration, drainage, or persistent bleeding.   Images permanently stored and available for review in the ultrasound unit.  Impression: Technically successful ultrasound guided injection.     Ultrasound evaluation right shoulder reveals difficult to visualize biceps tendon.  Rotator cuff tendons are present but difficult to visualize with some what appears to be calcific change within the tendon structures.   Assessment and Plan: 68 y.o. female with chronic bilateral shoulder pain.  Left shoulder pain we will proceed to subacromial injection.  Continued home exercise program and PT.  Consider MRI in the future if not improving.  Chronic right shoulder pain ongoing issue without a clear obvious solution.  She already has done a fair amount of physical therapy and conservative management.  She would like a second opinion which I think is very reasonable.  Her friend has seen [name] so we will refer there.  Dr. Genelle would be a good alternative as well.  On my and as long as it is okay with orthopedic surgery I think shockwave  would be reasonable to try.  I believe it would be a low risk intervention that could potentially help.  Happy to arrange for shockwave after she sees orthopedic surgery if reasonable.  Of note I will probably be out of the office for this time.  On medical leave.  I will be out of the office from August 1 to September 15.  My partner  Dr. Leonce can do shockwave if needed.   PDMP not reviewed this encounter. Orders Placed This Encounter  Procedures   US  LIMITED JOINT SPACE STRUCTURES UP BILAT(NO LINKED CHARGES)    Reason for Exam (SYMPTOM  OR DIAGNOSIS REQUIRED):   eval shoulder pain    Preferred imaging location?:   [facility] referral to Orthopedic Surgery    Referral Priority:   Routine    Referral Type:   Surgical    Referral Reason:   Specialty Services Required    Requested Specialty:   Orthopedic Surgery    Number of Visits Requested:   1   No orders of the defined types were placed in this encounter.    Discussed warning signs or symptoms. Please see discharge instructions. Patient expresses understanding.   The above documentation has been reviewed and is accurate and complete Artist Lloyd, M.D.

## 2023-11-15 NOTE — Patient Instructions (Addendum)
 Thank you for coming in today.   You received an injection today. Seek immediate medical attention if the joint becomes red, extremely painful, or is oozing fluid.   See what [name] says about shockwave therapy  Have a FABULOUS time at the beach!!  Reminder: Dr. Joane will be out of the office starting August 1st, for about 6 weeks

## 2023-11-16 ENCOUNTER — Ambulatory Visit: Admitting: Occupational Therapy

## 2023-11-16 ENCOUNTER — Ambulatory Visit: Admitting: Physical Therapy

## 2023-11-16 ENCOUNTER — Encounter: Payer: Self-pay | Admitting: Physical Therapy

## 2023-11-16 DIAGNOSIS — G8929 Other chronic pain: Secondary | ICD-10-CM | POA: Diagnosis not present

## 2023-11-16 DIAGNOSIS — R252 Cramp and spasm: Secondary | ICD-10-CM

## 2023-11-16 DIAGNOSIS — M25642 Stiffness of left hand, not elsewhere classified: Secondary | ICD-10-CM | POA: Diagnosis not present

## 2023-11-16 DIAGNOSIS — M79642 Pain in left hand: Secondary | ICD-10-CM | POA: Diagnosis not present

## 2023-11-16 DIAGNOSIS — M25611 Stiffness of right shoulder, not elsewhere classified: Secondary | ICD-10-CM

## 2023-11-16 DIAGNOSIS — M79641 Pain in right hand: Secondary | ICD-10-CM

## 2023-11-16 DIAGNOSIS — M25511 Pain in right shoulder: Secondary | ICD-10-CM | POA: Diagnosis not present

## 2023-11-16 DIAGNOSIS — M6281 Muscle weakness (generalized): Secondary | ICD-10-CM | POA: Diagnosis not present

## 2023-11-16 DIAGNOSIS — R278 Other lack of coordination: Secondary | ICD-10-CM

## 2023-11-16 DIAGNOSIS — M542 Cervicalgia: Secondary | ICD-10-CM

## 2023-11-16 DIAGNOSIS — M25641 Stiffness of right hand, not elsewhere classified: Secondary | ICD-10-CM | POA: Diagnosis not present

## 2023-11-16 NOTE — Therapy (Signed)
 OUTPATIENT OCCUPATIONAL THERAPY ORTHO Treatment  Patient Name: Veronica Galvan MRN: [id number] DOB:[birth year], 68 y.o., female Today's Date: 11/16/2023  PCP: Leita Eliza Elbe [name] REFERRING PROVIDER: Leita Eliza Elbe [name]  END OF SESSION:  OT End of Session - 11/16/23 1140     Visit Number 31    Number of Visits 35    Date for OT Re-Evaluation 12/13/23    Authorization Type [employer] MCR    Authorization Time Period 12 weeks    Authorization - Visit Number 31    Progress Note Due on Visit 40    OT Start Time 1100    OT Stop Time 1145    OT Time Calculation (min) 45 min    Activity Tolerance Patient tolerated treatment well    Behavior During Therapy WFL for tasks assessed/performed                           Past Medical History:  Diagnosis Date   Allergy     Anxiety    Arthritis    hands   Cataract    Diabetes mellitus without complication (HCC)    type 2   Family history of adverse reaction to anesthesia    son has malignant hyperthermia, daughter does not daughter recently had c section without problems   GERD (gastroesophageal reflux disease)    Headache    sinus   Hyperlipidemia    Hypertension    PONV (postoperative nausea and vomiting)    nausea only   Ulcer, stomach peptic yrs ago   Past Surgical History:  Procedure Laterality Date   APPENDECTOMY     both hells bone spur repair     both heels with metal clips   both shoulder rotator cuff repair     CESAREAN SECTION     x 1   COLONOSCOPY WITH PROPOFOL  N/A 09/07/2016   Procedure: COLONOSCOPY WITH PROPOFOL ;  Surgeon: Vicci Gladis POUR, MD;  Location: [facility];  Service: Endoscopy;  Laterality: N/A;   colonscopy  06/2011   polyps   ELBOW FRACTURE SURGERY Left    EYE SURGERY     FRACTURE SURGERY     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/10/2022   Procedure: REVERSE SHOULDER ARTHROPLASTY;  Surgeon: Melita Drivers, MD;  Location: [facility];  Service: Orthopedics;   Laterality: Right;  Please follow in room 6 if able   TUBAL LIGATION     VESICO-VAGINAL FISTULA REPAIR     Patient Active Problem List   Diagnosis Date Noted   Gastroesophageal reflux disease 11/16/2021   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 08/18/2021   Dermatofibroma 08/18/2021   History of malignant neoplasm of skin 08/18/2021   Lentigo 08/18/2021   Melanocytic nevi of trunk 08/18/2021   Nevus lipomatosus cutaneous superficialis 08/18/2021   Rosacea 08/18/2021   Actinic keratosis 08/18/2021   Sensorineural hearing loss (SNHL) of left ear with unrestricted hearing of right ear 07/26/2021   Tinnitus of left ear 07/26/2021   Acute recurrent maxillary sinusitis 06/22/2021   Primary hypertension 01/12/2021   Hyperlipidemia associated with type 2 diabetes mellitus (HCC) 01/12/2021   Arthritis 01/12/2021   Type II diabetes mellitus (HCC) 11/25/2019   Ingrown toenail 09/05/2018   Neck pain 01/29/2018   Tick bite 10/24/2017    ONSET DATE: 05/19/23  REFERRING DIAG:  Diagnosis  M79.641,M79.642 (ICD-10-CM) - Bilateral hand pain    THERAPY DIAG:  Muscle weakness (generalized)  Stiffness of left hand, not elsewhere classified  Stiffness of right hand, not elsewhere classified  Pain in right hand  Pain in left hand  Other lack of coordination  Rationale for Evaluation and Treatment: Rehabilitation  SUBJECTIVE:   SUBJECTIVE STATEMENT: Pt reports her hands and shoulders are sore today Pt accompanied by: self  PERTINENT HISTORY: 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 Yes: NPRS scale: 4/10 Pain location: right hand,  left hand Pain description: aching, stiff Aggravating factors: gripping  Relieving factors:  heat, meds Pt also has shoulder pain which is more significant grossly R shoulder 7/10 L shoulder pain 3/10 which OT will not address. PT is addressing shoulder FALLS: Has patient fallen in last 6 months? No  LIVING ENVIRONMENT: Lives with: lives alone Lives in: House/apartment Stairs: yes   PLOF: Independent  PATIENT GOALS: improve functional use of hands and learn adapted strategies for ADLs/IADLs.  NEXT MD VISIT: unknown  OBJECTIVE:  Note: Objective measures were completed at Evaluation unless otherwise noted.  HAND DOMINANCE: Right  ADLs: Overall ADLs: unable to grip credit card to remove from ATM Transfers/ambulation related to ADLs: mod I Eating: drops silverware Grooming: drops toothbrush, uses electric toothbrush, holding comb Upper body dressing: adjusting bra Lower body dressing: difficulty pulling up pants  Toileting: hygeine was difficult initally Bathing: mod I, difficult with shaving Tub shower transfers: walk in shower    FUNCTIONAL OUTCOME MEASURES: Quick Dash: 05/30/23- 75%% disability Quick Dash 08/15/23- 70.5% disability   UPPER EXTREMITY ROM:   RUE wrist flexion/ extension: 70/ 50, LUE wrist flexion/ extension: 70/ 45 Pt demonstrates grossly 50% composite flexion for right and 55 with left.   Active ROM Right eval Left eval  Thumb MCP (0-60) 45 55  Thumb IP (0-80) 25 10  Thumb Radial abd/add (0-55)     Thumb Palmar abd/add (0-45)     Thumb Opposition to Small Finger yes yes   Index MCP (0-90)     Index PIP (0-100)     Index DIP (0-70)      Long MCP (0-90)      Long PIP (0-100)      Long DIP (0-70)      Ring MCP (0-90)      Ring PIP (0-100)      Ring DIP (0-70)      Little MCP (0-90)      Little PIP (0-100)      Little DIP (0-70)      (Blank rows = not tested) 9 hole peg test (08/15/23) RUE 26.70 secs, LUE 23 secs  HAND FUNCTION: Grip strength: Right: 8 lbs; Left: 4 lbs,          08/15/23-  RUE 18, LUE 14 lbs,           08/15/23    Pinch: RUE tip 4lbs, lateral 8 lbs, LUE tip 3 lbs, lateral 8 lbs   SENSATION: Light touch: Impaired index finger LUE  EDEMA: Pt with bony defomities at PIP joints of all digits which pt reports are bone spurs, Pt also has bony deformity at DIP for right thumb, index and 5th digit and LUE small and index fingers  COGNITION: Overall cognitive status: Within functional limits for tasks assessed   OBSERVATIONS: Pleasant female desires to increase  functional use of bilateral UE's  , TREATMENT DATE:11/16/23-Paraffin to RUE x 8 mins, no adverse reactions, while therapist performed US  to left hand 3.3 mhz, 0.8 w/cm 2,  20%x 8 mins no adverse reactions for stiffness. Paraffin to LUE x 8 mins, no adverse reactions, while therapist performed US  to right hand 3.3 mhz, 0.8 w/cm 2,  20%x 8 mins no adverse reactions for stiffness.  Soft tissue and joint mobs bilateral hands and wrists.  Passive finger flexion to bilateral hands. Digi flex light restance for LUE indivisual and composite finger flexion. Squeeze ball green tree composite flexion with RUE. Discussed using a fork at home with difficulty, therapist recommends use of a foam grip.  11/07/23-Paraffin to RUE x 8 mins, no adverse reactions, while therapist performed US  to left hand 3.3 mhz, 0.8 w/cm 2,  20%x 8 mins no adverse reactions for stiffness. Paraffin to LUE x 8 mins, no adverse reactions, while therapist performed US  to right hand 3.3 mhz, 0.8 w/cm 2,  20%x 8 mins no adverse reactions for stiffness.  Passive finger flexion to bilateral hands. Therapist checked progress towards goals for PN/renewal as pt reports therapy has really helped her functional use of bilateral hands.  PATIENT EDUCATION: Education details:   see above Person educated: Patient Education method: Explanation, demonstration, v.c Education comprehension: verbalized understanding, returned demonstration  HOME EXERCISE PROGRAM: AROM yellow putty  GOALS: Goals  reviewed with patient? Yes  SHORT TERM GOALS:   I with HEP  Goal status:  met 06/07/23  2.  I with positioning/ splinting prn to minimize pain and defomity   Goal status: met, 07/13/23  3.  Pt will increase bilateral grip strength by 5 lbs for increased UE functional use. Upgraded goal Pt will increase bilateral grip strength to at least 25 lbs (after stretching). Baseline: 05/30/23- RUE 8 lbs, LUE 4 lbs Goal status:met 08/01/23-R 20 lbs, L  19 lbs,  08/15/23- RUE 18 lbsLUE 14 lbs continue to address upgraded goal  - RUE 19 lbs, LUE 24- 10/03/23  RUE 23 lbs LUE 25 lbs, met for LUE, ongoing for right. 10/17/23 11/07/23-RUE 20 lbs, LUE 30 lbs, met for LUE only, not met for RUE due to shoulder pain  4. Pt will demonstrate improved RUE fine motor coordination as evidenced by perfroming 9 hole peg test in 23 secs or less.  Goal status:10/17/23, 26.68- 10/24/23, 10/08/23-27.42, not met due to shoulder pain which impedes RUE function  5. I with updates to HEP.  Goal status: ongoing-continue as long term goal- 11/07/23  6. Pt will demonstrate improved composite finger flexion for ADLs as evidenced by pt. bringing middle fingertip for RUE within 1/2 an inch of palm.  Baseline: 3/4 inch from palm to middle fingertip  Goal status  not met 3/4 inch 09/19/23, 1 inch from palm 10/24/23, 11/07/23- 1 inch, not met  7.Pt will demonstrate improved composite finger flexion for ADLs as evidenced by pt. bringing middle fingertip for LUE within 3/4 an inch of palm.  Baseline: 1 inch from palm to middle finger tip  Goal status: met, 3/4 inch away from middle finger 09/19/23  LONG TERM GOALS: Target date: 12/13/23  I with updated HEP  Goal status:met 08/15/22  2.  Pt will improve Quick Dash score to 70% disability Baseline: 75% (05/30/23) Goal status:   improved - however not fully met due to complications from shoulder pain-70.5% 08/15/23  3.  Pt will demonstrate at least 65% composite flexion for LUE for increased  functional  use.  Goal status: met 65-70% , 08/15/23  4.  I with adapted strategies/ adapted equipment to minimize pain and to increase pt I with ADLs/IADLs  Goal status:  met for inital strategies, 11/16/23 ongoing, for additional strategies such as picking hair.  5.  Pt will demonstrate at least 65% composite flexion for RUE for increased functional use.  Goal status: met 70% 08/15/23  6. Pt will report that she is able to cut her meat such as steak or chicken modified independently.  [place]: unable  Goal status: met 09/21/23    7. Pt will report increased ease with opening/ closing ziplock bags.    Goal status: met 09/21/23    8. Pt will improve bilateral tip pinch by 2 lbs(from baseline) for increased ease with daily activities.                                baseline: RUE tip 4lbs, LUE tip 3 lbs,    Goal status:  RUE 4 lbs, LUE 4 lbs,   ongoing 11/07/23    9. Pt will improve bilateral lateral pinch by 2 lbs for increased ease with ADLs.    Goal status: met RUE 12 lbs, LUE 14- 11/07/23   10 I with updates to HEP    goal status: new 11.Pt will demonstrate improved composite finger flexion for ADLs as evidenced by pt. bringing middle fingertip for RUE within 3/4 an inch of palm   following stretching. Baseline: 11/07/23- 1 inch from palm Goal status: ongoing 11/16/23    12. Pt willl increase RUE grip strength to 25 lbs for increased functional use. Baseline7/8/21- 20 lbs Goal status: ongoing 11/16/23 ASSESSMENT: CLINICAL IMPRESSION: Pt is progressing towards goals. She rpeorts improved flexibility at end of session. PERFORMANCE DEFICITS: in functional skills including ADLs, IADLs, coordination, sensation, edema, ROM, strength, pain, flexibility, Fine motor control, Gross motor control, endurance, decreased knowledge of precautions, decreased knowledge of use of DME, and UE functional use,, and psychosocial skills including coping strategies, environmental adaptation, habits, interpersonal  interactions, and routines and behaviors.   IMPAIRMENTS: are limiting patient from ADLs, IADLs, rest and sleep, education, play, leisure, and social participation.   COMORBIDITIES: may have co-morbidities  that affects occupational performance. Patient will benefit from skilled OT to address above impairments and improve overall function.  MODIFICATION OR ASSISTANCE TO COMPLETE EVALUATION: No modification of tasks or assist necessary to complete an evaluation.  OT OCCUPATIONAL PROFILE AND HISTORY: Detailed assessment: Review of records and additional review of physical, cognitive, psychosocial history related to current functional performance.  CLINICAL DECISION MAKING: LOW - limited treatment options, no task modification necessary  REHAB POTENTIAL: Good  EVALUATION COMPLEXITY: Low      PLAN:  OT FREQUENCY: 1x week  OT DURATION: 5 weeks  PLANNED INTERVENTIONS: 97168 OT Re-evaluation, 97535 self care/ADL training, 02889 therapeutic exercise, 97530 therapeutic activity, 97112 neuromuscular re-education, 97140 manual therapy, 97113 aquatic therapy, 97035 ultrasound, 97018 paraffin, 02960 fluidotherapy, 97010 moist heat, 97010 cryotherapy, 97034 contrast bath, 97760 Orthotics management and training, 02239 Splinting (initial encounter), [id number] Subsequent splinting/medication, passive range of motion, energy conservation, coping strategies training, patient/family education, and DME and/or AE instructions  RECOMMENDED OTHER SERVICES: none  CONSULTED AND AGREED WITH PLAN OF CARE: Patient  PLAN FOR NEXT SESSION: Pt is on vacation next week, 2 more visits scheduled after that, update HEP prn   Kean Gautreau, OT 11/16/2023, 12:16 PM  OUTPATIENT OCCUPATIONAL THERAPY ORTHO Treatment  Patient Name: Veronica Galvan MRN: [id number] DOB:15-Dec-1955, 68 y.o., female Today's Date: 11/16/2023  PCP: Leita Eliza Elbe [name] REFERRING PROVIDER: Leita Eliza Elbe [name]  END OF SESSION:  OT End of Session - 11/16/23 1140     Visit Number 31    Number of Visits 35    Date for OT Re-Evaluation 12/13/23    Authorization Type [employer] MCR    Authorization Time Period 12 weeks    Authorization - Visit Number 31    Progress Note Due on Visit 40    OT Start Time 1100    OT Stop Time 1145    OT Time Calculation (min) 45 min    Activity Tolerance Patient tolerated treatment well    Behavior During Therapy WFL for tasks assessed/performed                           Past Medical History:  Diagnosis Date   Allergy     Anxiety    Arthritis    hands   Cataract    Diabetes mellitus without complication (HCC)    type 2   Family history of adverse reaction to anesthesia    son has malignant hyperthermia, daughter does not daughter recently had c section without problems   GERD (gastroesophageal reflux disease)    Headache    sinus   Hyperlipidemia    Hypertension    PONV (postoperative nausea and vomiting)    nausea only   Ulcer, stomach peptic yrs ago   Past Surgical History:  Procedure Laterality Date   APPENDECTOMY     both hells bone spur repair     both heels with metal clips   both shoulder rotator cuff repair     CESAREAN SECTION     x 1   COLONOSCOPY WITH PROPOFOL  N/A 09/07/2016   Procedure: COLONOSCOPY WITH PROPOFOL ;  Surgeon: Vicci Gladis POUR, MD;  Location: [facility];  Service: Endoscopy;  Laterality: N/A;   colonscopy  06/2011   polyps   ELBOW FRACTURE SURGERY Left    EYE SURGERY     FRACTURE SURGERY     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/10/2022   Procedure: REVERSE SHOULDER ARTHROPLASTY;  Surgeon: Melita Drivers, MD;  Location: [facility];  Service: Orthopedics;  Laterality: Right;  Please follow in room 6 if able   TUBAL LIGATION     VESICO-VAGINAL FISTULA REPAIR     Patient Active Problem List   Diagnosis Date Noted   Gastroesophageal reflux disease 11/16/2021   Asymptomatic  varicose veins of bilateral lower extremities 08/18/2021   Dermatofibroma 08/18/2021   History of malignant neoplasm of skin 08/18/2021   Lentigo 08/18/2021   Melanocytic nevi of trunk 08/18/2021   Nevus lipomatosus cutaneous superficialis 08/18/2021   Rosacea 08/18/2021   Actinic keratosis 08/18/2021   Sensorineural hearing loss (SNHL) of left ear with unrestricted hearing of right ear 07/26/2021   Tinnitus of left ear 07/26/2021   Acute recurrent maxillary sinusitis 06/22/2021   Primary hypertension 01/12/2021   Hyperlipidemia associated with type 2 diabetes mellitus (HCC) 01/12/2021   Arthritis 01/12/2021   Type II diabetes mellitus (HCC) 11/25/2019   Ingrown toenail 09/05/2018   Neck pain 01/29/2018   Tick bite 10/24/2017    ONSET DATE: 05/19/23  REFERRING DIAG:  Diagnosis  M79.641,M79.642 (ICD-10-CM) - Bilateral hand pain    THERAPY DIAG:  Muscle weakness (generalized)  Stiffness of left hand, not  elsewhere classified  Stiffness of right hand, not elsewhere classified  Pain in right hand  Pain in left hand  Other lack of coordination  Rationale for Evaluation and Treatment: Rehabilitation  SUBJECTIVE:   SUBJECTIVE STATEMENT: Pt reports  her hands felt looser after last OT session Pt accompanied by: self  PERTINENT HISTORY: 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 Yes: NPRS scale: 6/10- right hand Pain location: right hand,  5/10 left hand Pain description: aching, stiff Aggravating factors: gripping  Relieving factors: heat, meds Pt also has shoulder pain which is more significant grossly R shoulder 8/10 L shoulder pain 3/10 which OT will not address. PT is addressing shoulder FALLS: Has patient fallen in last 6 months?  No  LIVING ENVIRONMENT: Lives with: lives alone Lives in: House/apartment Stairs: yes   PLOF: Independent  PATIENT GOALS: improve functional use of hands and learn adapted strategies for ADLs/IADLs.  NEXT MD VISIT: unknown  OBJECTIVE:  Note: Objective measures were completed at Evaluation unless otherwise noted.  HAND DOMINANCE: Right  ADLs: Overall ADLs: unable to grip credit card to remove from ATM Transfers/ambulation related to ADLs: mod I Eating: drops silverware Grooming: drops toothbrush, uses electric toothbrush, holding comb Upper body dressing: adjusting bra Lower body dressing: difficulty pulling up pants  Toileting: hygeine was difficult initally Bathing: mod I, difficult with shaving Tub shower transfers: walk in shower    FUNCTIONAL OUTCOME MEASURES: Quick Dash: 05/30/23- 75%% disability Quick Dash 08/15/23- 70.5% disability   UPPER EXTREMITY ROM:   RUE wrist flexion/ extension: 70/ 50, LUE wrist flexion/ extension: 70/ 45 Pt demonstrates grossly 50% composite flexion for right and 555 with left.   Active ROM Right eval Left eval  Thumb MCP (0-60) 45 55  Thumb IP (0-80) 25 10  Thumb Radial abd/add (0-55)     Thumb Palmar abd/add (0-45)     Thumb Opposition to Small Finger yes yes   Index MCP (0-90)     Index PIP (0-100)     Index DIP (0-70)      Long MCP (0-90)      Long PIP (0-100)      Long DIP (0-70)      Ring MCP (0-90)      Ring PIP (0-100)      Ring DIP (0-70)      Little MCP (0-90)      Little PIP (0-100)      Little DIP (0-70)      (Blank rows = not tested) 9 hole peg test (08/15/23) RUE 26.70 secs, LUE 23 secs  HAND FUNCTION: Grip strength: Right: 8 lbs; Left: 4 lbs,          08/15/23-  RUE 18, LUE 14 lbs,           08/15/23   Pinch: RUE tip 4lbs, lateral 8 lbs, LUE tip 3 lbs, lateral 8 lbs   SENSATION: Light touch: Impaired index finger LUE  EDEMA: Pt with bony defomities at PIP joints of all digits which pt reports are bone  spurs, Pt also has bony deformity at DIP for right thumb, index and 5th digit and LUE small and index fingers  COGNITION: Overall cognitive status: Within functional limits for tasks assessed   OBSERVATIONS: Pleasant female desires to increase  functional use of bilateral UE's  , TREATMENT DATE:10/31/23-Paraffin to RUE x 8 mins, no adverse reactions, while therapist performed US  to left hand 3.3 mhz, 0.8 w/cm 2,  20%x 8 mins no adverse reactions for stiffness. Paraffin to LUE x 8 mins, no adverse reactions, while therapist performed US  to right hand 3.3 mhz, 0.8 w/cm 2,  20%x 8 mins no adverse reactions for stiffness.  Pt reports increased pain and stiffness after using her hands alot to prepare meals for [employer] last week. Pt was encouraged to rest her hands and use paraffin at home if possible. Therapist recommends avoiding use of putty for several days o allow pain to resolve. Discussed plans to renew next visit and to continue for another month. Gentle soft tissue and joint mobs/ distraction to digits, hand, wrist forearms bilaterally  Passive composite finger flexion to right hand and then individual finger flexion to left hand .  10/24/23-Paraffin to RUE x 10 mins, no adverse reactions, while therapist performed US  to left hand 3.3 mhz, 0.8 w/cm 2,  20%x 8 mins no adverse reactions for stiffness. Paraffin to LUE x 10 mins, no adverse reactions, while therapist performed US  to right hand 3.3 mhz, 0.8 w/cm 2,  20%x 8 mins no adverse reactions for stiffness.  Gentle soft tissue and joint mobs/ distraction to digits, hand, wrist bilaterally as welll as right forearm, pt reports  hands and wrists are feeling looser at end of session. Passive composite finger flexion to right hand and then individual finger flexion to left hand . Therapist checked grip strength:  RUE 23 lbs LUE 25 lbs, pt demonstrates bilateral improvements and she reports continuing improving functional use.   10/03/23 Paraffin to RUE  x 10 mins, no adverse reactions, while therapist performed US  to left hand 3.3 mhz, 0.8 w/cm 2,  20%x 8 mins no adverse reactions for stiffness. Paraffin to LUE x 10 mins, no adverse reactions, while therapist performed US  to right hand 3.3 mhz, 0.8 w/cm 2,  20%x 8 mins no adverse reactions for stiffness.  Passive composite finger flexion to right hand and then individual finger flexion to left hand . Gentle soft tissue and joint mobs/ distraction to digits, hand, wrist bilaterally as welll as right foream, pt reports feeling looser Resistive gripping with  tree squeeze ball left and right UE's digi flex, light resistance for composite and individual finger flexion for LUE   PATIENT EDUCATION: Education details:   see above Person educated: Patient Education method: Explanation, demonstration, v.c Education comprehension: verbalized understanding, returned demonstration  HOME EXERCISE PROGRAM: AROM yellow putty  GOALS: Goals reviewed with patient? Yes  SHORT TERM GOALS: Target date: 09/14/23  I with HEP  Goal status:  met 06/07/23  2.  I with positioning/ splinting prn to minimize pain and defomity   Goal status: met, 07/13/23  3.  Pt will increase bilateral grip strength by 5 lbs for increased UE functional use. Upgraded goal Pt will increase bilateral grip strength to at least 25 lbs (after stretching). Baseline: 05/30/23- RUE 8 lbs, LUE 4 lbs Goal status:met 08/01/23-R 20 lbs, L  19 lbs,  08/15/23- RUE 18 lbsLUE 14 lbs continue to address upgraded goal  - RUE 19 lbs, LUE 24- 10/03/23  RUE 23 lbs LUE 25 lbs, met for LUE, ongoing for right. 10/17/23  4. Pt will demonstrate improved RUE fine motor coordination as evidenced by perfroming 9 hole peg test in 23 secs or less.  Goal status: ongoing 10/17/23 5. I with  updates to HEP.  Goal status: ongoing 10/17/23 6. Pt will demonstrate improved composite finger flexion for ADLs as evidenced by pt. bringing middle fingertip for RUE within 1/2  an inch of palm.  Baseline: 3/4 inch from palm to middle fingertip  Goal status : ongoing, 3/4 inch 09/19/23 7.Pt will demonstrate improved composite finger flexion for ADLs as evidenced by pt. bringing middle fingertip for LUE within 3/4 an inch of palm.  Baseline: 1 inch from palm to middle finger tip  Goal status: met, 3/4 inch away from middle finger 09/19/23  LONG TERM GOALS: Target date:11/07/23  I with updated HEP  Goal status:met 08/15/22  2.  Pt will improve Quick Dash score to 70% disability Baseline: 75% (05/30/23) Goal status:   improved - however not fully met due to complications from shoulder pain-70.5%08/15/23  3.  Pt will demonstrate at least 65% composte flexion for LUE for increased functional use.  Goal status: met 65-70% , 08/15/23  4.  I with adapted strategies/ adapted equipment to minimize pain and to increase pt I with ADLs/IADLs  Goal status:  met for inital strategies, however continue goal as pt may report additional tasks that can benefit from modification.ongoing  10/31/23  5.  Pt will demonstrate at least 65% composite flexion for RUE for increased functional use.  Goal status: met 70% 08/15/23  6. Pt will report that she is able to cut her meat such as steak or chicken modified independently.  [place]: unable  Goal status: met 09/21/23    7. Pt will report increased ease with opening/ closing ziplock bags.    Goal status: met 09/21/23    8. Pt will improve bilateral tip pinch by 2 lbs for increased ease with daily activities.     Goal status: ongoing 10/31/23    9. Pt will improve bilateral lateral pinch by 2 lbs for increased ease with ADLs.    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   IMPAIRMENTS: are limiting patient from ADLs, IADLs, rest and sleep, education, play, leisure, and social participation.   COMORBIDITIES: may have co-morbidities  that affects occupational performance. Patient will benefit from skilled OT to address above impairments and improve overall function.  MODIFICATION OR ASSISTANCE TO COMPLETE EVALUATION: No modification of tasks or assist necessary to complete an evaluation.  OT OCCUPATIONAL PROFILE AND HISTORY: Detailed assessment: Review of records and additional review of physical, cognitive, psychosocial history related to current functional performance.  CLINICAL DECISION MAKING: LOW - limited treatment options, no task modification necessary  REHAB POTENTIAL: Good  EVALUATION COMPLEXITY: Low      PLAN:  OT FREQUENCY: 2x/week  OT DURATION: 12 weeks( anticipate d/c after 8 weeks dependent on progress)  PLANNED INTERVENTIONS: 02831 OT Re-evaluation, 97535 self care/ADL training, 02889 therapeutic exercise, 97530 therapeutic activity, 97112 neuromuscular re-education, 97140 manual therapy, 97113 aquatic therapy, 97035 ultrasound, 97018 paraffin, 02960 fluidotherapy, 97010 moist heat, 97010 cryotherapy, 97034 contrast bath, 97760 Orthotics management and training, 02239 Splinting (initial encounter), [id number] Subsequent splinting/medication, passive range of motion, energy conservation, coping strategies training, patient/family education, and DME  and/or AE instructions  RECOMMENDED OTHER SERVICES: none  CONSULTED AND AGREED WITH PLAN OF CARE: Patient  PLAN FOR NEXT SESSION:  check  goals, renew, functional use and ROM for bilateral hands   Madalyne Husk,  OT 11/16/2023, 12:16 PM                     OUTPATIENT OCCUPATIONAL THERAPY ORTHO Treatment  Patient Name: Veronica Galvan MRN: [id number] DOB:03-01-1956, 67 y.o., female Today's Date: 11/16/2023  PCP: Leita Eliza Elbe [name] REFERRING PROVIDER: Leita Eliza Elbe [name]  END OF SESSION:  OT End of Session - 11/16/23 1140     Visit Number 31    Number of Visits 35    Date for OT Re-Evaluation 12/13/23    Authorization Type [employer] MCR    Authorization Time Period 12 weeks    Authorization - Visit Number 31    Progress Note Due on Visit 40    OT Start Time 1100    OT Stop Time 1145    OT Time Calculation (min) 45 min    Activity Tolerance Patient tolerated treatment well    Behavior During Therapy WFL for tasks assessed/performed                           Past Medical History:  Diagnosis Date   Allergy     Anxiety    Arthritis    hands   Cataract    Diabetes mellitus without complication (HCC)    type 2   Family history of adverse reaction to anesthesia    son has malignant hyperthermia, daughter does not daughter recently had c section without problems   GERD (gastroesophageal reflux disease)    Headache    sinus   Hyperlipidemia    Hypertension    PONV (postoperative nausea and vomiting)    nausea only   Ulcer, stomach peptic yrs ago   Past Surgical History:  Procedure Laterality Date   APPENDECTOMY     both hells bone spur repair     both heels with metal clips   both shoulder rotator cuff repair     CESAREAN SECTION     x 1   COLONOSCOPY WITH PROPOFOL  N/A 09/07/2016   Procedure: COLONOSCOPY WITH PROPOFOL ;  Surgeon: Vicci Gladis POUR, MD;  Location: [facility];  Service: Endoscopy;  Laterality: N/A;   colonscopy  06/2011   polyps   ELBOW FRACTURE SURGERY Left    EYE SURGERY     FRACTURE SURGERY     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/10/2022   Procedure: REVERSE SHOULDER ARTHROPLASTY;  Surgeon: Melita Drivers,  MD;  Location: [facility];  Service: Orthopedics;  Laterality: Right;  Please follow in room 6 if able   TUBAL LIGATION     VESICO-VAGINAL FISTULA REPAIR     Patient Active Problem List   Diagnosis Date Noted   Gastroesophageal reflux disease 11/16/2021   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 08/18/2021   Dermatofibroma 08/18/2021   History of malignant neoplasm of skin 08/18/2021   Lentigo 08/18/2021   Melanocytic nevi of trunk 08/18/2021   Nevus lipomatosus cutaneous superficialis 08/18/2021   Rosacea 08/18/2021   Actinic keratosis 08/18/2021   Sensorineural hearing loss (SNHL) of left ear with unrestricted hearing of right ear 07/26/2021   Tinnitus of left ear 07/26/2021   Acute recurrent maxillary sinusitis 06/22/2021   Primary hypertension 01/12/2021   Hyperlipidemia associated with type 2 diabetes mellitus (HCC)  01/12/2021   Arthritis 01/12/2021   Type II diabetes mellitus (HCC) 11/25/2019   Ingrown toenail 09/05/2018   Neck pain 01/29/2018   Tick bite 10/24/2017    ONSET DATE: 05/19/23  REFERRING DIAG:  Diagnosis  M79.641,M79.642 (ICD-10-CM) - Bilateral hand pain    THERAPY DIAG:  Muscle weakness (generalized)  Stiffness of left hand, not elsewhere classified  Stiffness of right hand, not elsewhere classified  Pain in right hand  Pain in left hand  Other lack of coordination  Rationale for Evaluation and Treatment: Rehabilitation  SUBJECTIVE:   SUBJECTIVE STATEMENT: Pt reports  her hands felt looser after last OT session Pt accompanied by: self  PERTINENT HISTORY: 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 Yes: NPRS scale: 4/10 Pain location: right hand,  left hand Pain description: aching,  stiff Aggravating factors: gripping  Relieving factors: heat, meds Pt also has shoulder pain which is more significant grossly R shoulder 7/10 L shoulder pain 3/10 which OT will not address. PT is addressing shoulder FALLS: Has patient fallen in last 6 months? No  LIVING ENVIRONMENT: Lives with: lives alone Lives in: House/apartment Stairs: yes   PLOF: Independent  PATIENT GOALS: improve functional use of hands and learn adapted strategies for ADLs/IADLs.  NEXT MD VISIT: unknown  OBJECTIVE:  Note: Objective measures were completed at Evaluation unless otherwise noted.  HAND DOMINANCE: Right  ADLs: Overall ADLs: unable to grip credit card to remove from ATM Transfers/ambulation related to ADLs: mod I Eating: drops silverware Grooming: drops toothbrush, uses electric toothbrush, holding comb Upper body dressing: adjusting bra Lower body dressing: difficulty pulling up pants  Toileting: hygeine was difficult initally Bathing: mod I, difficult with shaving Tub shower transfers: walk in shower    FUNCTIONAL OUTCOME MEASURES: Quick Dash: 05/30/23- 75%% disability Quick Dash 08/15/23- 70.5% disability   UPPER EXTREMITY ROM:   RUE wrist flexion/ extension: 70/ 50, LUE wrist flexion/ extension: 70/ 45 Pt demonstrates grossly 50% composite flexion for right and 555 with left.   Active ROM Right eval Left eval  Thumb MCP (0-60) 45 55  Thumb IP (0-80) 25 10  Thumb Radial abd/add (0-55)     Thumb Palmar abd/add (0-45)     Thumb Opposition to Small Finger yes yes   Index MCP (0-90)     Index PIP (0-100)     Index DIP (0-70)      Long MCP (0-90)      Long PIP (0-100)      Long DIP (0-70)      Ring MCP (0-90)      Ring PIP (0-100)      Ring DIP (0-70)      Little MCP (0-90)      Little PIP (0-100)      Little DIP (0-70)      (Blank rows = not tested) 9 hole peg test (08/15/23) RUE 26.70 secs, LUE 23 secs  HAND FUNCTION: Grip strength: Right: 8 lbs; Left: 4 lbs,           08/15/23-  RUE 18, LUE 14 lbs,           08/15/23   Pinch: RUE tip 4lbs, lateral 8 lbs, LUE tip 3 lbs, lateral 8 lbs   SENSATION: Light touch: Impaired index finger  LUE  EDEMA: Pt with bony defomities at PIP joints of all digits which pt reports are bone spurs, Pt also has bony deformity at DIP for right thumb, index and 5th digit and LUE small and index fingers  COGNITION: Overall cognitive status: Within functional limits for tasks assessed   OBSERVATIONS: Pleasant female desires to increase functional use of bilateral UE's  , TREATMENT DATE:10/24/23-Paraffin to RUE x 10 mins, no adverse reactions, while therapist performed US  to left hand 3.3 mhz, 0.8 w/cm 2,  20%x 8 mins no adverse reactions for stiffness. Paraffin to LUE x 10 mins, no adverse reactions, while therapist performed US  to right hand 3.3 mhz, 0.8 w/cm 2,  20%x 8 mins no adverse reactions for stiffness.  Gentle soft tissue and joint mobs/ distraction to digits, hand, wrist bilaterally as welll as right forearm,  Passive finger flexion to bilateral hands. Therpist checked 9 hole peg test, see goals. 10/17/23-Paraffin to RUE x 10 mins, no adverse reactions, while therapist performed US  to left hand 3.3 mhz, 0.8 w/cm 2,  20%x 8 mins no adverse reactions for stiffness. Paraffin to LUE x 10 mins, no adverse reactions, while therapist performed US  to right hand 3.3 mhz, 0.8 w/cm 2,  20%x 8 mins no adverse reactions for stiffness.  Gentle soft tissue and joint mobs/ distraction to digits, hand, wrist bilaterally as welll as right forearm, pt reports  hands and wrists are feeling looser at end of session. Passive composite finger flexion to right hand and then individual finger flexion to left hand . Therapist checked grip strength:  RUE 23 lbs LUE 25 lbs, pt demonstrates bilateral improvements and she reports continuing improving functional use.   10/03/23 Paraffin to RUE x 10 mins, no adverse reactions, while therapist performed US   to left hand 3.3 mhz, 0.8 w/cm 2,  20%x 8 mins no adverse reactions for stiffness. Paraffin to LUE x 10 mins, no adverse reactions, while therapist performed US  to right hand 3.3 mhz, 0.8 w/cm 2,  20%x 8 mins no adverse reactions for stiffness.  Passive composite finger flexion to right hand and then individual finger flexion to left hand . Gentle soft tissue and joint mobs/ distraction to digits, hand, wrist bilaterally as welll as right foream, pt reports feeling looser Resistive gripping with  tree squeeze ball left and right UE's digi flex, light resistance for composite and individual finger flexion for LUE   PATIENT EDUCATION: Education details:   see above Person educated: Patient Education method: Explanation, demonstration, v.c Education comprehension: verbalized understanding, returned demonstration  HOME EXERCISE PROGRAM: AROM yellow putty  GOALS: Goals reviewed with patient? Yes  SHORT TERM GOALS: Target date: 09/14/23  I with HEP  Goal status:  met 06/07/23  2.  I with positioning/ splinting prn to minimize pain and defomity   Goal status: met, 07/13/23  3.  Pt will increase bilateral grip strength by 5 lbs for increased UE functional use. Upgraded goal Pt will increase bilateral grip strength to at least 25 lbs (after stretching). Baseline: 05/30/23- RUE 8 lbs, LUE 4 lbs Goal status:met 08/01/23-R 20 lbs, L  19 lbs,  08/15/23- RUE 18 lbsLUE 14 lbs continue to address upgraded goal  - RUE 19 lbs, LUE 24- 10/03/23  RUE 23 lbs LUE 25 lbs, met for LUE, ongoing for right. 10/17/23  4. Pt will demonstrate improved RUE fine motor coordination as evidenced by perfroming 9 hole peg test in 23 secs or less.  Goal status: ongoing 10/17/23, ongoing 26.68-  10/24/23 5. I with updates to HEP.  Goal status: ongoing 10/17/23 6. Pt will demonstrate improved composite finger flexion for ADLs as evidenced by pt. bringing middle fingertip for RUE within 1/2 an inch of palm.  Baseline: 3/4 inch  from palm to middle fingertip  Goal status : ongoing, 3/4 inch 09/19/23, 1 inch from palm 10/24/23 7.Pt will demonstrate improved composite finger flexion for ADLs as evidenced by pt. bringing middle fingertip for LUE within 3/4 an inch of palm.  Baseline: 1 inch from palm to middle finger tip  Goal status: met, 3/4 inch away from middle finger 09/19/23  LONG TERM GOALS: Target date:11/07/23  I with updated HEP  Goal status:met 08/15/22  2.  Pt will improve Quick Dash score to 70% disability Baseline: 75% (05/30/23) Goal status:   improved - however not fully met due to complications from shoulder pain-70.5%08/15/23  3.  Pt will demonstrate at least 65% composte flexion for LUE for increased functional use.  Goal status: met 65-70% , 08/15/23  4.  I with adapted strategies/ adapted equipment to minimize pain and to increase pt I with ADLs/IADLs  Goal status:  met for inital strategies, however continue goal as pt may report additional tasks that can benefit from modification.ongoing 10/03/23  5.  Pt will demonstrate at least 65% composite flexion for RUE for increased functional use.  Goal status: met 70% 08/15/23  6. Pt will report that she is able to cut her meat such as steak or chicken modified independently.  [place]: unable  Goal status: met 09/21/23    7. Pt will report increased ease with opening/ closing ziplock bags.    Goal status: met 09/21/23    8. Pt will improve bilateral tip pinch by 2 lbs for increased ease with daily activities.     Goal status: ongoing 09/19/23    9. Pt will improve bilateral lateral pinch by 2 lbs for increased ease with ADLs.    Goal status: ongoing 09/19/23   ASSESSMENT: CLINICAL IMPRESSION: Pt reports improved ability to use hands functionally for meal prep during [employer]. Today hand pain is 4/10 which is improved overall. PERFORMANCE DEFICITS: in functional skills including ADLs, IADLs, coordination, sensation, edema, ROM, strength, pain, flexibility,  Fine motor control, Gross motor control, endurance, decreased knowledge of precautions, decreased knowledge of use of DME, and UE functional use,, and psychosocial skills including coping strategies, environmental adaptation, habits, interpersonal interactions, and routines and behaviors.   IMPAIRMENTS: are limiting patient from ADLs, IADLs, rest and sleep, education, play, leisure, and social participation.   COMORBIDITIES: may have co-morbidities  that affects occupational performance. Patient will benefit from skilled OT to address above impairments and improve overall function.  MODIFICATION OR ASSISTANCE TO COMPLETE EVALUATION: No modification of tasks or assist necessary to complete an evaluation.  OT OCCUPATIONAL PROFILE AND HISTORY: Detailed assessment: Review of records and additional review of physical, cognitive, psychosocial history related to current functional performance.  CLINICAL DECISION MAKING: LOW - limited treatment options, no task modification necessary  REHAB POTENTIAL: Good  EVALUATION COMPLEXITY: Low      PLAN:  OT FREQUENCY: 2x/week  OT DURATION: 12 weeks( anticipate d/c after 8 weeks dependent on progress)  PLANNED INTERVENTIONS: 97168 OT Re-evaluation, 97535 self care/ADL training, 02889 therapeutic exercise, 97530 therapeutic activity, 97112 neuromuscular re-education, 97140 manual therapy, 97113 aquatic therapy, 97035 ultrasound, 97018 paraffin, 02960 fluidotherapy, 97010 moist heat, 97010 cryotherapy, 97034 contrast bath, 97760 Orthotics management and training, 02239 Splinting (initial encounter), [id number] Subsequent splinting/medication,  passive range of motion, energy conservation, coping strategies training, patient/family education, and DME and/or AE instructions  RECOMMENDED OTHER SERVICES: none  CONSULTED AND AGREED WITH PLAN OF CARE: Patient  PLAN FOR NEXT SESSION:   functional use and ROM for bilateral hands   Tamecca Artiga, OT 11/16/2023, 12:16  PM                     OUTPATIENT OCCUPATIONAL THERAPY ORTHO Treatment  Patient Name: Veronica Galvan MRN: [id number] DOB:09-01-1955, 68 y.o., female Today's Date: 11/16/2023  PCP: Leita Eliza Elbe [name] REFERRING PROVIDER: Leita Eliza Elbe [name]  END OF SESSION:  OT End of Session - 11/16/23 1140     Visit Number 31    Number of Visits 35    Date for OT Re-Evaluation 12/13/23    Authorization Type [employer] MCR    Authorization Time Period 12 weeks    Authorization - Visit Number 31    Progress Note Due on Visit 40    OT Start Time 1100    OT Stop Time 1145    OT Time Calculation (min) 45 min    Activity Tolerance Patient tolerated treatment well    Behavior During Therapy WFL for tasks assessed/performed                           Past Medical History:  Diagnosis Date   Allergy     Anxiety    Arthritis    hands   Cataract    Diabetes mellitus without complication (HCC)    type 2   Family history of adverse reaction to anesthesia    son has malignant hyperthermia, daughter does not daughter recently had c section without problems   GERD (gastroesophageal reflux disease)    Headache    sinus   Hyperlipidemia    Hypertension    PONV (postoperative nausea and vomiting)    nausea only   Ulcer, stomach peptic yrs ago   Past Surgical History:  Procedure Laterality Date   APPENDECTOMY     both hells bone spur repair     both heels with metal clips   both shoulder rotator cuff repair     CESAREAN SECTION     x 1   COLONOSCOPY WITH PROPOFOL  N/A 09/07/2016   Procedure: COLONOSCOPY WITH PROPOFOL ;  Surgeon: Vicci Gladis POUR, MD;  Location: [facility];  Service: Endoscopy;  Laterality: N/A;   colonscopy  06/2011   polyps   ELBOW FRACTURE SURGERY Left    EYE SURGERY     FRACTURE SURGERY     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/10/2022   Procedure: REVERSE SHOULDER ARTHROPLASTY;  Surgeon: Melita Drivers, MD;  Location: [facility];   Service: Orthopedics;  Laterality: Right;  Please follow in room 6 if able   TUBAL LIGATION     VESICO-VAGINAL FISTULA REPAIR     Patient Active Problem List   Diagnosis Date Noted   Gastroesophageal reflux disease 11/16/2021   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 08/18/2021   Dermatofibroma 08/18/2021   History of malignant neoplasm of skin 08/18/2021   Lentigo 08/18/2021   Melanocytic nevi of trunk 08/18/2021   Nevus lipomatosus cutaneous superficialis 08/18/2021   Rosacea 08/18/2021   Actinic keratosis 08/18/2021   Sensorineural hearing loss (SNHL) of left ear with unrestricted hearing of right ear 07/26/2021   Tinnitus of left ear 07/26/2021   Acute recurrent maxillary sinusitis 06/22/2021   Primary hypertension 01/12/2021  Hyperlipidemia associated with type 2 diabetes mellitus (HCC) 01/12/2021   Arthritis 01/12/2021   Type II diabetes mellitus (HCC) 11/25/2019   Ingrown toenail 09/05/2018   Neck pain 01/29/2018   Tick bite 10/24/2017    ONSET DATE: 05/19/23  REFERRING DIAG:  Diagnosis  M79.641,M79.642 (ICD-10-CM) - Bilateral hand pain    THERAPY DIAG:  Muscle weakness (generalized)  Stiffness of left hand, not elsewhere classified  Stiffness of right hand, not elsewhere classified  Pain in right hand  Pain in left hand  Other lack of coordination  Rationale for Evaluation and Treatment: Rehabilitation  SUBJECTIVE:   SUBJECTIVE STATEMENT: Pt reports  her hands felt looser after last OT session Pt accompanied by: self  PERTINENT HISTORY: 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 Yes: NPRS scale: 7/10- right hand Pain location: right hand,  3/10 left hand Pain description: aching,  stiff Aggravating factors: gripping  Relieving factors: heat, meds Pt also has shoulder pain which is more significant grossly R shoulder 8/10 L shoulder pain 3/10 which OT will not address. PT is addressing shoulder FALLS: Has patient fallen in last 6 months? No  LIVING ENVIRONMENT: Lives with: lives alone Lives in: House/apartment Stairs: yes   PLOF: Independent  PATIENT GOALS: improve functional use of hands and learn adapted strategies for ADLs/IADLs.  NEXT MD VISIT: unknown  OBJECTIVE:  Note: Objective measures were completed at Evaluation unless otherwise noted.  HAND DOMINANCE: Right  ADLs: Overall ADLs: unable to grip credit card to remove from ATM Transfers/ambulation related to ADLs: mod I Eating: drops silverware Grooming: drops toothbrush, uses electric toothbrush, holding comb Upper body dressing: adjusting bra Lower body dressing: difficulty pulling up pants  Toileting: hygeine was difficult initally Bathing: mod I, difficult with shaving Tub shower transfers: walk in shower    FUNCTIONAL OUTCOME MEASURES: Quick Dash: 05/30/23- 75%% disability Quick Dash 08/15/23- 70.5% disability   UPPER EXTREMITY ROM:   RUE wrist flexion/ extension: 70/ 50, LUE wrist flexion/ extension: 70/ 45 Pt demonstrates grossly 50% composite flexion for right and 555 with left.   Active ROM Right eval Left eval  Thumb MCP (0-60) 45 55  Thumb IP (0-80) 25 10  Thumb Radial abd/add (0-55)     Thumb Palmar abd/add (0-45)     Thumb Opposition to Small Finger yes yes   Index MCP (0-90)     Index PIP (0-100)     Index DIP (0-70)      Long MCP (0-90)      Long PIP (0-100)      Long DIP (0-70)      Ring MCP (0-90)      Ring PIP (0-100)      Ring DIP (0-70)      Little MCP (0-90)      Little PIP (0-100)      Little DIP (0-70)      (Blank rows = not tested) 9 hole peg test (08/15/23) RUE 26.70 secs, LUE 23 secs  HAND FUNCTION: Grip strength: Right: 8 lbs; Left: 4 lbs,           08/15/23-  RUE 18, LUE 14 lbs,           08/15/23   Pinch: RUE tip 4lbs, lateral 8 lbs, LUE tip 3 lbs,  lateral 8 lbs   SENSATION: Light touch: Impaired index finger LUE  EDEMA: Pt with bony defomities at PIP joints of all digits which pt reports are bone spurs, Pt also has bony deformity at DIP for right thumb, index and 5th digit and LUE small and index fingers  COGNITION: Overall cognitive status: Within functional limits for tasks assessed   OBSERVATIONS: Pleasant female desires to increase functional use of bilateral UE's  , TREATMENT DATE:Paraffin to RUE x 10 mins, no adverse reactions, while therapist performed US  to left hand 3.3 mhz, 0.8 w/cm 2,  20%x 8 mins no adverse reactions for stiffness. Paraffin to LUE x 10 mins, no adverse reactions, while therapist performed US  to right hand 3.3 mhz, 0.8 w/cm 2,  20%x 8 mins no adverse reactions for stiffness.  Gentle soft tissue and joint mobs/ distraction to digits, hand, wrist bilaterally as welll as right forearm, pt reports  hands and wrists are feeling looser at end of session. Passive composite finger flexion to right hand and then individual finger flexion to left hand . Therapist checked grip strength:  RUE 23 lbs LUE 25 lbs, pt demonstrates bilateral improvements and she reports continuing improving functional use.   10/03/23 Paraffin to RUE x 10 mins, no adverse reactions, while therapist performed US  to left hand 3.3 mhz, 0.8 w/cm 2,  20%x 8 mins no adverse reactions for stiffness. Paraffin to LUE x 10 mins, no adverse reactions, while therapist performed US  to right hand 3.3 mhz, 0.8 w/cm 2,  20%x 8 mins no adverse reactions for stiffness.  Passive composite finger flexion to right hand and then individual finger flexion to left hand . Gentle soft tissue and joint mobs/ distraction to digits, hand, wrist bilaterally as welll as right foream, pt reports feeling looser Resistive gripping with  tree squeeze ball left and right  UE's digi flex, light resistance for composite and individual finger flexion for LUE   PATIENT EDUCATION: Education details:   see above Person educated: Patient Education method: Explanation, demonstration, v.c Education comprehension: verbalized understanding, returned demonstration  HOME EXERCISE PROGRAM: AROM yellow putty  GOALS: Goals reviewed with patient? Yes  SHORT TERM GOALS: Target date: 09/14/23  I with HEP  Goal status:  met 06/07/23  2.  I with positioning/ splinting prn to minimize pain and defomity   Goal status: met, 07/13/23  3.  Pt will increase bilateral grip strength by 5 lbs for increased UE functional use. Upgraded goal Pt will increase bilateral grip strength to at least 25 lbs (after stretching). Baseline: 05/30/23- RUE 8 lbs, LUE 4 lbs Goal status:met 08/01/23-R 20 lbs, L  19 lbs,  08/15/23- RUE 18 lbsLUE 14 lbs continue to address upgraded goal  - RUE 19 lbs, LUE 24- 10/03/23  RUE 23 lbs LUE 25 lbs, met for LUE, ongoing for right. 10/17/23  4. Pt will demonstrate improved RUE fine motor coordination as evidenced by perfroming 9 hole peg test in 23 secs or less.  Goal status: ongoing 10/17/23 5. I with updates to HEP.  Goal status: ongoing 10/17/23 6. Pt will demonstrate improved composite finger flexion for ADLs as evidenced by pt. bringing middle fingertip for RUE within 1/2 an inch of palm.  Baseline: 3/4 inch from palm to middle fingertip  Goal status : ongoing, 3/4 inch 09/19/23 7.Pt will demonstrate improved composite finger flexion for ADLs as evidenced by pt. bringing middle fingertip for LUE within 3/4 an inch of palm.  Baseline: 1 inch from palm to  middle finger tip  Goal status: met, 3/4 inch away from middle finger 09/19/23  LONG TERM GOALS: Target date:11/07/23  I with updated HEP  Goal status:met 08/15/22  2.  Pt will improve Quick Dash score to 70% disability Baseline: 75% (05/30/23) Goal status:   improved - however not fully met due to  complications from shoulder pain-70.5%08/15/23  3.  Pt will demonstrate at least 65% composte flexion for LUE for increased functional use.  Goal status: met 65-70% , 08/15/23  4.  I with adapted strategies/ adapted equipment to minimize pain and to increase pt I with ADLs/IADLs  Goal status:  met for inital strategies, however continue goal as pt may report additional tasks that can benefit from modification.ongoing 10/03/23  5.  Pt will demonstrate at least 65% composite flexion for RUE for increased functional use.  Goal status: met 70% 08/15/23  6. Pt will report that she is able to cut her meat such as steak or chicken modified independently.  [place]: unable  Goal status: met 09/21/23    7. Pt will report increased ease with opening/ closing ziplock bags.    Goal status: met 09/21/23    8. Pt will improve bilateral tip pinch by 2 lbs for increased ease with daily activities.     Goal status: ongoing 09/19/23    9. Pt will improve bilateral lateral pinch by 2 lbs for increased ease with ADLs.    Goal status: ongoing 09/19/23   ASSESSMENT: CLINICAL IMPRESSION: Pt demonstrates improving flexibility and grip strength.PERFORMANCE DEFICITS: in functional skills including ADLs, IADLs, coordination, sensation, edema, ROM, strength, pain, flexibility, Fine motor control, Gross motor control, endurance, decreased knowledge of precautions, decreased knowledge of use of DME, and UE functional use,, and psychosocial skills including coping strategies, environmental adaptation, habits, interpersonal interactions, and routines and behaviors.   IMPAIRMENTS: are limiting patient from ADLs, IADLs, rest and sleep, education, play, leisure, and social participation.   COMORBIDITIES: may have co-morbidities  that affects occupational performance. Patient will benefit from skilled OT to address above impairments and improve overall function.  MODIFICATION OR ASSISTANCE TO COMPLETE EVALUATION: No  modification of tasks or assist necessary to complete an evaluation.  OT OCCUPATIONAL PROFILE AND HISTORY: Detailed assessment: Review of records and additional review of physical, cognitive, psychosocial history related to current functional performance.  CLINICAL DECISION MAKING: LOW - limited treatment options, no task modification necessary  REHAB POTENTIAL: Good  EVALUATION COMPLEXITY: Low      PLAN:  OT FREQUENCY: 2x/week  OT DURATION: 12 weeks( anticipate d/c after 8 weeks dependent on progress)  PLANNED INTERVENTIONS: 02831 OT Re-evaluation, 97535 self care/ADL training, 02889 therapeutic exercise, 97530 therapeutic activity, 97112 neuromuscular re-education, 97140 manual therapy, 97113 aquatic therapy, 97035 ultrasound, 97018 paraffin, 02960 fluidotherapy, 97010 moist heat, 97010 cryotherapy, 97034 contrast bath, 97760 Orthotics management and training, 02239 Splinting (initial encounter), [id number] Subsequent splinting/medication, passive range of motion, energy conservation, coping strategies training, patient/family education, and DME and/or AE instructions  RECOMMENDED OTHER SERVICES: none  CONSULTED AND AGREED WITH PLAN OF CARE: Patient  PLAN FOR NEXT SESSION:   check progress towards goals and and plan to renew.   Keilyn Nadal, OT 11/16/2023, 12:16 PM                     OUTPATIENT OCCUPATIONAL THERAPY ORTHO Treatment  Patient Name: Veronica Galvan MRN: [id number] DOB:Aug 13, 1955, 68 y.o., female Today's Date: 11/16/2023  PCP: Leita Eliza Elbe [name] REFERRING PROVIDER: Leita Eliza Elbe [name]  END OF SESSION:  OT End of Session - 11/16/23 1140     Visit Number 31    Number of Visits 35    Date for OT Re-Evaluation 12/13/23    Authorization Type [employer] MCR    Authorization Time Period 12 weeks    Authorization - Visit Number 31    Progress Note Due on Visit 40    OT Start Time 1100    OT Stop Time 1145    OT Time Calculation (min) 45  min    Activity Tolerance Patient tolerated treatment well    Behavior During Therapy WFL for tasks assessed/performed                           Past Medical History:  Diagnosis Date   Allergy     Anxiety    Arthritis    hands   Cataract    Diabetes mellitus without complication (HCC)    type 2   Family history of adverse reaction to anesthesia    son has malignant hyperthermia, daughter does not daughter recently had c section without problems   GERD (gastroesophageal reflux disease)    Headache    sinus   Hyperlipidemia    Hypertension    PONV (postoperative nausea and vomiting)    nausea only   Ulcer, stomach peptic yrs ago   Past Surgical History:  Procedure Laterality Date   APPENDECTOMY     both hells bone spur repair     both heels with metal clips   both shoulder rotator cuff repair     CESAREAN SECTION     x 1   COLONOSCOPY WITH PROPOFOL  N/A 09/07/2016   Procedure: COLONOSCOPY WITH PROPOFOL ;  Surgeon: Vicci Gladis POUR, MD;  Location: [facility];  Service: Endoscopy;  Laterality: N/A;   colonscopy  06/2011   polyps   ELBOW FRACTURE SURGERY Left    EYE SURGERY     FRACTURE SURGERY     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/10/2022   Procedure: REVERSE SHOULDER ARTHROPLASTY;  Surgeon: Melita Drivers, MD;  Location: [facility];  Service: Orthopedics;  Laterality: Right;  Please follow in room 6 if able   TUBAL LIGATION     VESICO-VAGINAL FISTULA REPAIR     Patient Active Problem List   Diagnosis Date Noted   Gastroesophageal reflux disease 11/16/2021   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 08/18/2021   Dermatofibroma 08/18/2021   History of malignant neoplasm of skin 08/18/2021   Lentigo 08/18/2021   Melanocytic nevi of trunk 08/18/2021   Nevus lipomatosus cutaneous superficialis 08/18/2021   Rosacea 08/18/2021   Actinic keratosis 08/18/2021   Sensorineural hearing loss (SNHL) of left ear with unrestricted  hearing of right ear 07/26/2021   Tinnitus of left ear 07/26/2021   Acute recurrent maxillary sinusitis 06/22/2021   Primary hypertension 01/12/2021   Hyperlipidemia associated with type 2 diabetes mellitus (HCC) 01/12/2021   Arthritis 01/12/2021   Type II diabetes mellitus (HCC) 11/25/2019   Ingrown toenail 09/05/2018   Neck pain 01/29/2018   Tick bite 10/24/2017    ONSET DATE: 05/19/23  REFERRING DIAG:  Diagnosis  M79.641,M79.642 (ICD-10-CM) - Bilateral hand pain    THERAPY DIAG:  Muscle weakness (generalized)  Stiffness of left hand, not elsewhere classified  Stiffness of right hand, not elsewhere classified  Pain in right hand  Pain in left hand  Other lack of coordination  Rationale for Evaluation and Treatment: Rehabilitation  SUBJECTIVE:   SUBJECTIVE STATEMENT: Pt reports  her hands felt looser after last OT session Pt accompanied by: self  PERTINENT HISTORY: 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 Yes: NPRS scale: 4/10 Pain location: right hand,  left hand Pain description: aching, stiff Aggravating factors: gripping  Relieving factors: heat, meds Pt also has shoulder pain which is more significant grossly R shoulder 7/10 L shoulder pain 3/10 which OT will not address. PT is addressing shoulder FALLS: Has patient fallen in last 6 months? No  LIVING ENVIRONMENT: Lives with: lives alone Lives in: House/apartment Stairs: yes   PLOF: Independent  PATIENT GOALS: improve functional use of hands and learn adapted strategies for ADLs/IADLs.  NEXT MD VISIT: unknown  OBJECTIVE:  Note: Objective measures were completed at Evaluation unless otherwise noted.  HAND DOMINANCE: Right  ADLs: Overall ADLs: unable to grip  credit card to remove from ATM Transfers/ambulation related to ADLs: mod I Eating: drops silverware Grooming: drops toothbrush, uses electric toothbrush, holding comb Upper body dressing: adjusting bra Lower body dressing: difficulty pulling up pants  Toileting: hygeine was difficult initally Bathing: mod I, difficult with shaving Tub shower transfers: walk in shower    FUNCTIONAL OUTCOME MEASURES: Quick Dash: 05/30/23- 75%% disability Quick Dash 08/15/23- 70.5% disability   UPPER EXTREMITY ROM:   RUE wrist flexion/ extension: 70/ 50, LUE wrist flexion/ extension: 70/ 45 Pt demonstrates grossly 50% composite flexion for right and 555 with left.   Active ROM Right eval Left eval  Thumb MCP (0-60) 45 55  Thumb IP (0-80) 25 10  Thumb Radial abd/add (0-55)     Thumb Palmar abd/add (0-45)     Thumb Opposition to Small Finger yes yes   Index MCP (0-90)     Index PIP (0-100)     Index DIP (0-70)      Long MCP (0-90)      Long PIP (0-100)      Long DIP (0-70)      Ring MCP (0-90)      Ring PIP (0-100)      Ring DIP (0-70)      Little MCP (0-90)      Little PIP (0-100)      Little DIP (0-70)      (Blank rows = not tested) 9 hole peg test (08/15/23) RUE 26.70 secs, LUE 23 secs  HAND FUNCTION: Grip strength: Right: 8 lbs; Left: 4 lbs,          08/15/23-  RUE 18, LUE 14 lbs,           08/15/23   Pinch: RUE tip 4lbs, lateral 8 lbs, LUE tip 3 lbs, lateral 8 lbs   SENSATION: Light touch: Impaired index finger LUE  EDEMA: Pt with bony defomities at PIP joints of all digits which pt reports are bone spurs, Pt also has bony deformity at DIP for right thumb, index and 5th digit and LUE small and index fingers  COGNITION: Overall cognitive status: Within functional limits for tasks assessed   OBSERVATIONS: Pleasant female desires to increase functional use of bilateral UE's  , TREATMENT DATE:10/24/23-Paraffin to RUE x 10 mins, no adverse reactions, while therapist performed US  to  left hand 3.3 mhz, 0.8 w/cm 2,  20%x 8 mins no adverse reactions  for stiffness. Paraffin to LUE x 10 mins, no adverse reactions, while therapist performed US  to right hand 3.3 mhz, 0.8 w/cm 2,  20%x 8 mins no adverse reactions for stiffness.  Gentle soft tissue and joint mobs/ distraction to digits, hand, wrist bilaterally as welll as right forearm,  Passive finger flexion to bilateral hands. Therpist checked 9 hole peg test, see goals. 10/17/23-Paraffin to RUE x 10 mins, no adverse reactions, while therapist performed US  to left hand 3.3 mhz, 0.8 w/cm 2,  20%x 8 mins no adverse reactions for stiffness. Paraffin to LUE x 10 mins, no adverse reactions, while therapist performed US  to right hand 3.3 mhz, 0.8 w/cm 2,  20%x 8 mins no adverse reactions for stiffness.  Gentle soft tissue and joint mobs/ distraction to digits, hand, wrist bilaterally as welll as right forearm, pt reports  hands and wrists are feeling looser at end of session. Passive composite finger flexion to right hand and then individual finger flexion to left hand . Therapist checked grip strength:  RUE 23 lbs LUE 25 lbs, pt demonstrates bilateral improvements and she reports continuing improving functional use.   10/03/23 Paraffin to RUE x 10 mins, no adverse reactions, while therapist performed US  to left hand 3.3 mhz, 0.8 w/cm 2,  20%x 8 mins no adverse reactions for stiffness. Paraffin to LUE x 10 mins, no adverse reactions, while therapist performed US  to right hand 3.3 mhz, 0.8 w/cm 2,  20%x 8 mins no adverse reactions for stiffness.  Passive composite finger flexion to right hand and then individual finger flexion to left hand . Gentle soft tissue and joint mobs/ distraction to digits, hand, wrist bilaterally as welll as right foream, pt reports feeling looser Resistive gripping with  tree squeeze ball left and right UE's digi flex, light resistance for composite and individual finger flexion for LUE   PATIENT  EDUCATION: Education details:   see above Person educated: Patient Education method: Explanation, demonstration, v.c Education comprehension: verbalized understanding, returned demonstration  HOME EXERCISE PROGRAM: AROM yellow putty  GOALS: Goals reviewed with patient? Yes  SHORT TERM GOALS: Target date: 09/14/23  I with HEP  Goal status:  met 06/07/23  2.  I with positioning/ splinting prn to minimize pain and defomity   Goal status: met, 07/13/23  3.  Pt will increase bilateral grip strength by 5 lbs for increased UE functional use. Upgraded goal Pt will increase bilateral grip strength to at least 25 lbs (after stretching). Baseline: 05/30/23- RUE 8 lbs, LUE 4 lbs Goal status:met 08/01/23-R 20 lbs, L  19 lbs,  08/15/23- RUE 18 lbsLUE 14 lbs continue to address upgraded goal  - RUE 19 lbs, LUE 24- 10/03/23  RUE 23 lbs LUE 25 lbs, met for LUE, ongoing for right. 10/17/23  4. Pt will demonstrate improved RUE fine motor coordination as evidenced by perfroming 9 hole peg test in 23 secs or less.  Goal status: ongoing 10/17/23, ongoing 26.68- 10/24/23 5. I with updates to HEP.  Goal status: ongoing 10/17/23 6. Pt will demonstrate improved composite finger flexion for ADLs as evidenced by pt. bringing middle fingertip for RUE within 1/2 an inch of palm.  Baseline: 3/4 inch from palm to middle fingertip  Goal status : ongoing, 3/4 inch 09/19/23, 1 inch from palm 10/24/23 7.Pt will demonstrate improved composite finger flexion for ADLs as evidenced by pt. bringing middle fingertip for LUE within 3/4 an inch of palm.  Baseline: 1 inch from palm to middle finger tip  Goal status: met, 3/4 inch away from middle finger 09/19/23  LONG TERM GOALS: Target date:11/07/23  I with updated HEP  Goal status:met 08/15/22  2.  Pt will improve Quick Dash score to 70% disability Baseline: 75% (05/30/23) Goal status:   improved - however not fully met due to complications from shoulder pain-70.5%08/15/23  3.   Pt will demonstrate at least 65% composte flexion for LUE for increased functional use.  Goal status: met 65-70% , 08/15/23  4.  I with adapted strategies/ adapted equipment to minimize pain and to increase pt I with ADLs/IADLs  Goal status:  met for inital strategies, however continue goal as pt may report additional tasks that can benefit from modification.ongoing 10/03/23  5.  Pt will demonstrate at least 65% composite flexion for RUE for increased functional use.  Goal status: met 70% 08/15/23  6. Pt will report that she is able to cut her meat such as steak or chicken modified independently.  [place]: unable  Goal status: met 09/21/23    7. Pt will report increased ease with opening/ closing ziplock bags.    Goal status: met 09/21/23    8. Pt will improve bilateral tip pinch by 2 lbs for increased ease with daily activities.     Goal status: ongoing 09/19/23    9. Pt will improve bilateral lateral pinch by 2 lbs for increased ease with ADLs.    Goal status: ongoing 09/19/23   ASSESSMENT: CLINICAL IMPRESSION: Pt reports improved ability to use hands functionally for meal prep during VBS. Today hand pain is 4/10 which is improved overall. PERFORMANCE DEFICITS: in functional skills including ADLs, IADLs, coordination, sensation, edema, ROM, strength, pain, flexibility, Fine motor control, Gross motor control, endurance, decreased knowledge of precautions, decreased knowledge of use of DME, and UE functional use,, and psychosocial skills including coping strategies, environmental adaptation, habits, interpersonal interactions, and routines and behaviors.   IMPAIRMENTS: are limiting patient from ADLs, IADLs, rest and sleep, education, play, leisure, and social participation.   COMORBIDITIES: may have co-morbidities  that affects occupational performance. Patient will benefit from skilled OT to address above impairments and improve overall function.  MODIFICATION OR ASSISTANCE TO COMPLETE  EVALUATION: No modification of tasks or assist necessary to complete an evaluation.  OT OCCUPATIONAL PROFILE AND HISTORY: Detailed assessment: Review of records and additional review of physical, cognitive, psychosocial history related to current functional performance.  CLINICAL DECISION MAKING: LOW - limited treatment options, no task modification necessary  REHAB POTENTIAL: Good  EVALUATION COMPLEXITY: Low      PLAN:  OT FREQUENCY: 2x/week  OT DURATION: 12 weeks( anticipate d/c after 8 weeks dependent on progress)  PLANNED INTERVENTIONS: 02831 OT Re-evaluation, 97535 self care/ADL training, 02889 therapeutic exercise, 97530 therapeutic activity, 97112 neuromuscular re-education, 97140 manual therapy, 97113 aquatic therapy, 97035 ultrasound, 97018 paraffin, 02960 fluidotherapy, 97010 moist heat, 97010 cryotherapy, 97034 contrast bath, 97760 Orthotics management and training, 02239 Splinting (initial encounter), [id number] Subsequent splinting/medication, passive range of motion, energy conservation, coping strategies training, patient/family education, and DME and/or AE instructions  RECOMMENDED OTHER SERVICES: none  CONSULTED AND AGREED WITH PLAN OF CARE: Patient  PLAN FOR NEXT SESSION:   functional use and ROM for bilateral hands   Dangelo Guzzetta, OT 11/16/2023, 12:16 PM                     OUTPATIENT OCCUPATIONAL THERAPY ORTHO Treatment  Patient Name: Veronica Galvan MRN: [id number] DOB:[birth year], 68 y.o., female Today's Date: 11/16/2023  PCP: Leita  Eliza Elbe [name] REFERRING PROVIDER: Leita Eliza Elbe [name]  END OF SESSION:  OT End of Session - 11/16/23 1140     Visit Number 31    Number of Visits 35    Date for OT Re-Evaluation 12/13/23    Authorization Type [employer] MCR    Authorization Time Period 12 weeks    Authorization - Visit Number 31    Progress Note Due on Visit 40    OT Start Time 1100    OT Stop Time 1145    OT Time Calculation (min)  45 min    Activity Tolerance Patient tolerated treatment well    Behavior During Therapy WFL for tasks assessed/performed                           Past Medical History:  Diagnosis Date   Allergy     Anxiety    Arthritis    hands   Cataract    Diabetes mellitus without complication (HCC)    type 2   Family history of adverse reaction to anesthesia    son has malignant hyperthermia, daughter does not daughter recently had c section without problems   GERD (gastroesophageal reflux disease)    Headache    sinus   Hyperlipidemia    Hypertension    PONV (postoperative nausea and vomiting)    nausea only   Ulcer, stomach peptic yrs ago   Past Surgical History:  Procedure Laterality Date   APPENDECTOMY     both hells bone spur repair     both heels with metal clips   both shoulder rotator cuff repair     CESAREAN SECTION     x 1   COLONOSCOPY WITH PROPOFOL  N/A 09/07/2016   Procedure: COLONOSCOPY WITH PROPOFOL ;  Surgeon: Vicci Gladis POUR, MD;  Location: [facility];  Service: Endoscopy;  Laterality: N/A;   colonscopy  06/2011   polyps   ELBOW FRACTURE SURGERY Left    EYE SURGERY     FRACTURE SURGERY     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/10/2022   Procedure: REVERSE SHOULDER ARTHROPLASTY;  Surgeon: Melita Drivers, MD;  Location: [facility];  Service: Orthopedics;  Laterality: Right;  Please follow in room 6 if able   TUBAL LIGATION     VESICO-VAGINAL FISTULA REPAIR     Patient Active Problem List   Diagnosis Date Noted   Gastroesophageal reflux disease 11/16/2021   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 08/18/2021   Dermatofibroma 08/18/2021   History of malignant neoplasm of skin 08/18/2021   Lentigo 08/18/2021   Melanocytic nevi of trunk 08/18/2021   Nevus lipomatosus cutaneous superficialis 08/18/2021   Rosacea 08/18/2021   Actinic keratosis 08/18/2021   Sensorineural hearing loss (SNHL) of left ear with unrestricted  hearing of right ear 07/26/2021   Tinnitus of left ear 07/26/2021   Acute recurrent maxillary sinusitis 06/22/2021   Primary hypertension 01/12/2021   Hyperlipidemia associated with type 2 diabetes mellitus (HCC) 01/12/2021   Arthritis 01/12/2021   Type II diabetes mellitus (HCC) 11/25/2019   Ingrown toenail 09/05/2018   Neck pain 01/29/2018   Tick bite 10/24/2017    ONSET DATE: 05/19/23  REFERRING DIAG:  Diagnosis  M79.641,M79.642 (ICD-10-CM) - Bilateral hand pain    THERAPY DIAG:  Muscle weakness (generalized)  Stiffness of left hand, not elsewhere classified  Stiffness of right hand, not elsewhere classified  Pain in right hand  Pain in left hand  Other lack  of coordination  Rationale for Evaluation and Treatment: Rehabilitation  SUBJECTIVE:   SUBJECTIVE STATEMENT: Pt reports  her hands felt looser after last OT session Pt accompanied by: self  PERTINENT HISTORY: 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 Yes: NPRS scale: 7/10- right hand Pain location: right hand,  3/10 left hand Pain description: aching, stiff Aggravating factors: gripping  Relieving factors: heat, meds Pt also has shoulder pain which is more significant grossly R shoulder 8/10 L shoulder pain 3/10 which OT will not address. PT is addressing shoulder FALLS: Has patient fallen in last 6 months? No  LIVING ENVIRONMENT: Lives with: lives alone Lives in: House/apartment Stairs: yes   PLOF: Independent  PATIENT GOALS: improve functional use of hands and learn adapted strategies for ADLs/IADLs.  NEXT MD VISIT: unknown  OBJECTIVE:  Note: Objective measures were completed at Evaluation unless otherwise noted.  HAND DOMINANCE: Right  ADLs: Overall ADLs:  unable to grip credit card to remove from ATM Transfers/ambulation related to ADLs: mod I Eating: drops silverware Grooming: drops toothbrush, uses electric toothbrush, holding comb Upper body dressing: adjusting bra Lower body dressing: difficulty pulling up pants  Toileting: hygeine was difficult initally Bathing: mod I, difficult with shaving Tub shower transfers: walk in shower    FUNCTIONAL OUTCOME MEASURES: Quick Dash: 05/30/23- 75%% disability Quick Dash 08/15/23- 70.5% disability   UPPER EXTREMITY ROM:   RUE wrist flexion/ extension: 70/ 50, LUE wrist flexion/ extension: 70/ 45 Pt demonstrates grossly 50% composite flexion for right and 555 with left.   Active ROM Right eval Left eval  Thumb MCP (0-60) 45 55  Thumb IP (0-80) 25 10  Thumb Radial abd/add (0-55)     Thumb Palmar abd/add (0-45)     Thumb Opposition to Small Finger yes yes   Index MCP (0-90)     Index PIP (0-100)     Index DIP (0-70)      Long MCP (0-90)      Long PIP (0-100)      Long DIP (0-70)      Ring MCP (0-90)      Ring PIP (0-100)      Ring DIP (0-70)      Little MCP (0-90)      Little PIP (0-100)      Little DIP (0-70)      (Blank rows = not tested) 9 hole peg test (08/15/23) RUE 26.70 secs, LUE 23 secs  HAND FUNCTION: Grip strength: Right: 8 lbs; Left: 4 lbs,          08/15/23-  RUE 18, LUE 14 lbs,           08/15/23   Pinch: RUE tip 4lbs, lateral 8 lbs, LUE tip 3 lbs, lateral 8 lbs   SENSATION: Light touch: Impaired index finger LUE  EDEMA: Pt with bony defomities at PIP joints of all digits which pt reports are bone spurs, Pt also has bony deformity at DIP for right thumb, index and 5th digit and LUE small and index fingers  COGNITION: Overall cognitive status: Within functional limits for tasks assessed   OBSERVATIONS: Pleasant female desires to increase functional use of bilateral UE's  , TREATMENT DATE:Paraffin to RUE x 10 mins, no adverse reactions, while therapist performed  US  to left  hand 3.3 mhz, 0.8 w/cm 2,  20%x 8 mins no adverse reactions for stiffness. Paraffin to LUE x 10 mins, no adverse reactions, while therapist performed US  to right hand 3.3 mhz, 0.8 w/cm 2,  20%x 8 mins no adverse reactions for stiffness.  Gentle soft tissue and joint mobs/ distraction to digits, hand, wrist bilaterally as welll as right forearm, pt reports  hands and wrists are feeling looser at end of session. Passive composite finger flexion to right hand and then individual finger flexion to left hand . Therapist checked grip strength:  RUE 23 lbs LUE 25 lbs, pt demonstrates bilateral improvements and she reports continuing improving functional use.   10/03/23 Paraffin to RUE x 10 mins, no adverse reactions, while therapist performed US  to left hand 3.3 mhz, 0.8 w/cm 2,  20%x 8 mins no adverse reactions for stiffness. Paraffin to LUE x 10 mins, no adverse reactions, while therapist performed US  to right hand 3.3 mhz, 0.8 w/cm 2,  20%x 8 mins no adverse reactions for stiffness.  Passive composite finger flexion to right hand and then individual finger flexion to left hand . Gentle soft tissue and joint mobs/ distraction to digits, hand, wrist bilaterally as welll as right foream, pt reports feeling looser Resistive gripping with  tree squeeze ball left and right UE's digi flex, light resistance for composite and individual finger flexion for LUE   PATIENT EDUCATION: Education details:   see above Person educated: Patient Education method: Explanation, demonstration, v.c Education comprehension: verbalized understanding, returned demonstration  HOME EXERCISE PROGRAM: AROM yellow putty  GOALS: Goals reviewed with patient? Yes  SHORT TERM GOALS: Target date: 09/14/23  I with HEP  Goal status:  met 06/07/23  2.  I with positioning/ splinting prn to minimize pain and defomity   Goal status: met, 07/13/23  3.  Pt will increase bilateral grip strength by 5 lbs for increased UE  functional use. Upgraded goal Pt will increase bilateral grip strength to at least 25 lbs (after stretching). Baseline: 05/30/23- RUE 8 lbs, LUE 4 lbs Goal status:met 08/01/23-R 20 lbs, L  19 lbs,  08/15/23- RUE 18 lbsLUE 14 lbs continue to address upgraded goal  - RUE 19 lbs, LUE 24- 10/03/23  RUE 23 lbs LUE 25 lbs, met for LUE, ongoing for right. 10/17/23  4. Pt will demonstrate improved RUE fine motor coordination as evidenced by perfroming 9 hole peg test in 23 secs or less.  Goal status: ongoing 10/17/23 5. I with updates to HEP.  Goal status: ongoing 10/17/23 6. Pt will demonstrate improved composite finger flexion for ADLs as evidenced by pt. bringing middle fingertip for RUE within 1/2 an inch of palm.  Baseline: 3/4 inch from palm to middle fingertip  Goal status : ongoing, 3/4 inch 09/19/23 7.Pt will demonstrate improved composite finger flexion for ADLs as evidenced by pt. bringing middle fingertip for LUE within 3/4 an inch of palm.  Baseline: 1 inch from palm to middle finger tip  Goal status: met, 3/4 inch away from middle finger 09/19/23  LONG TERM GOALS: Target date:11/07/23  I with updated HEP  Goal status:met 08/15/22  2.  Pt will improve Quick Dash score to 70% disability Baseline: 75% (05/30/23) Goal status:   improved - however not fully met due to complications from shoulder pain-70.5%08/15/23  3.  Pt will demonstrate at least 65% composte flexion for LUE for increased functional use.  Goal status: met 65-70% , 08/15/23  4.  I with adapted strategies/ adapted  equipment to minimize pain and to increase pt I with ADLs/IADLs  Goal status:  met for inital strategies, however continue goal as pt may report additional tasks that can benefit from modification.ongoing 10/03/23  5.  Pt will demonstrate at least 65% composite flexion for RUE for increased functional use.  Goal status: met 70% 08/15/23  6. Pt will report that she is able to cut her meat such as steak or chicken  modified independently.  [place]: unable  Goal status: met 09/21/23    7. Pt will report increased ease with opening/ closing ziplock bags.    Goal status: met 09/21/23    8. Pt will improve bilateral tip pinch by 2 lbs for increased ease with daily activities.     Goal status: ongoing 09/19/23    9. Pt will improve bilateral lateral pinch by 2 lbs for increased ease with ADLs.    Goal status: ongoing 09/19/23 increased ease rolling dice  ASSESSMENT: CLINICAL IMPRESSION: Pt demonstrates improving flexibility and grip strength.PERFORMANCE DEFICITS: in functional skills including ADLs, IADLs, coordination, sensation, edema, ROM, strength, pain, flexibility, Fine motor control, Gross motor control, endurance, decreased knowledge of precautions, decreased knowledge of use of DME, and UE functional use,, and psychosocial skills including coping strategies, environmental adaptation, habits, interpersonal interactions, and routines and behaviors.   IMPAIRMENTS: are limiting patient from ADLs, IADLs, rest and sleep, education, play, leisure, and social participation.   COMORBIDITIES: may have co-morbidities  that affects occupational performance. Patient will benefit from skilled OT to address above impairments and improve overall function.  MODIFICATION OR ASSISTANCE TO COMPLETE EVALUATION: No modification of tasks or assist necessary to complete an evaluation.  OT OCCUPATIONAL PROFILE AND HISTORY: Detailed assessment: Review of records and additional review of physical, cognitive, psychosocial history related to current functional performance.  CLINICAL DECISION MAKING: LOW - limited treatment options, no task modification necessary  REHAB POTENTIAL: Good  EVALUATION COMPLEXITY: Low      PLAN:  OT FREQUENCY: 2x/week  OT DURATION: 12 weeks( anticipate d/c after 8 weeks dependent on progress)  PLANNED INTERVENTIONS: 02831 OT Re-evaluation, 97535 self care/ADL training, 02889 therapeutic  exercise, 97530 therapeutic activity, 97112 neuromuscular re-education, 97140 manual therapy, 97113 aquatic therapy, 97035 ultrasound, 97018 paraffin, 02960 fluidotherapy, 97010 moist heat, 97010 cryotherapy, 97034 contrast bath, 97760 Orthotics management and training, 02239 Splinting (initial encounter), [id number] Subsequent splinting/medication, passive range of motion, energy conservation, coping strategies training, patient/family education, and DME and/or AE instructions  RECOMMENDED OTHER SERVICES: none  CONSULTED AND AGREED WITH PLAN OF CARE: Patient  PLAN FOR NEXT SESSION:  check 9 hole peg test, functional use and ROM for bilateral hands   Adriel Desrosier, OT 11/16/2023, 12:16 PM

## 2023-11-16 NOTE — Therapy (Signed)
 OUTPATIENT PHYSICAL THERAPY SHOULDER    Patient Name: Veronica Galvan MRN: [id number] DOB:Jan 18, 1956, 68 y.o., female Today's Date: 11/16/2023  END OF SESSION:  PT End of Session - 11/16/23 1608     Visit Number 83    Date for PT Re-Evaluation 12/07/23    Authorization Type [employer] [place]    PT Start Time 1608    PT Stop Time 1709    PT Time Calculation (min) 61 min    Activity Tolerance Patient tolerated treatment well    Behavior During Therapy WFL for tasks assessed/performed          Past Medical History:  Diagnosis Date   Allergy     Anxiety    Arthritis    hands   Cataract    Diabetes mellitus without complication (HCC)    type 2   Family history of adverse reaction to anesthesia    son has malignant hyperthermia, daughter does not daughter recently had c section without problems   GERD (gastroesophageal reflux disease)    Headache    sinus   Hyperlipidemia    Hypertension    PONV (postoperative nausea and vomiting)    nausea only   Ulcer, stomach peptic yrs ago   Past Surgical History:  Procedure Laterality Date   APPENDECTOMY     both hells bone spur repair     both heels with metal clips   both shoulder rotator cuff repair     CESAREAN SECTION     x 1   COLONOSCOPY WITH PROPOFOL  N/A 09/07/2016   Procedure: COLONOSCOPY WITH PROPOFOL ;  Surgeon: Vicci Gladis POUR, MD;  Location: [facility];  Service: Endoscopy;  Laterality: N/A;   colonscopy  06/2011   polyps   ELBOW FRACTURE SURGERY Left    EYE SURGERY     FRACTURE SURGERY     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/10/2022   Procedure: REVERSE SHOULDER ARTHROPLASTY;  Surgeon: Melita Drivers, MD;  Location: [facility];  Service: Orthopedics;  Laterality: Right;  Please follow in room 6 if able   TUBAL LIGATION     VESICO-VAGINAL FISTULA REPAIR     Patient Active Problem List   Diagnosis Date Noted   Gastroesophageal reflux disease 11/16/2021   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 08/18/2021   Dermatofibroma 08/18/2021   History of malignant neoplasm of skin 08/18/2021   Lentigo 08/18/2021   Melanocytic nevi of trunk 08/18/2021   Nevus lipomatosus cutaneous superficialis 08/18/2021   Rosacea 08/18/2021   Actinic keratosis 08/18/2021   Sensorineural hearing loss (SNHL) of left ear with unrestricted hearing of right ear 07/26/2021   Tinnitus of left ear 07/26/2021   Acute recurrent maxillary sinusitis 06/22/2021   Primary hypertension 01/12/2021   Hyperlipidemia associated with type 2 diabetes mellitus (HCC) 01/12/2021   Arthritis 01/12/2021   Type II diabetes mellitus (HCC) 11/25/2019   Ingrown toenail 09/05/2018   Neck pain 01/29/2018   Tick bite 10/24/2017    PCP: [name], [name]  REFERRING PROVIDER: Melita, MD  REFERRING DIAG: s/p right reverse TSA  THERAPY DIAG:  Muscle weakness (generalized)  Chronic right shoulder pain  Stiffness of right shoulder, not elsewhere classified  Spasm  Cervicalgia  Rationale for Evaluation and Treatment: Rehabilitation  ONSET DATE: 11/05/22  SUBJECTIVE:  SUBJECTIVE STATEMENT:  Saw Dr. Joane yesterday.  He did an US  to look a  little, difficulty visualizing biceps, did feel like there may be some calcific areas in some of the tendons.    Patient reports that she saw the surgeon she is frustrated and upset as she feels that she still has no answers, it is felt that there is no loosening and no infection.  She is still hurting with basic ADL's, her AROM decreased beginning in March, as she started having increased pain in mid January after a very busy and active few month, she has an order for the left shoulder and the okay for us  to see her for her right shoulder for another month PAIN:  Are you having pain? Yes: NPRS scale:  5/10 Pain location: right shoulder, HA Pain description: dull ache at rest, sharp with motions Aggravating factors: quick motions, movements pain up to 10/10 Relieving factors: ice, rest, Tylenol  at best 3/10  PRECAUTIONS: Shoulder Protocol in the chart  RED FLAGS: None   WEIGHT BEARING RESTRICTIONS: No  FALLS:  Has patient fallen in last 6 months? Yes. Number of falls 1  LIVING ENVIRONMENT: Lives with: lives alone Lives in: House/apartment Stairs: No Has following equipment at home: None  OCCUPATION: retired  PLOF: Independent  PATIENT GOALS:dress without difficulty, do hair, have good ROM and less pain  NEXT MD VISIT:   OBJECTIVE:   DIAGNOSTIC FINDINGS:  See above  PATIENT SURVEYS:  FOTO 21  COGNITION: Overall cognitive status: Within functional limits for tasks assessed     SENSATION: WFL  POSTURE: Fwd head, rounded shoulders, elevated and guarded shoulder  UPPER EXTREMITY ROM:   Active ROM Right PROM eval Left AROM  08/31/23 PROM Supine 01/05/23 PROM  01/26/23 PROM 02/09/23 AROM sitting 02/14/23 AROM 02/21/23 AROM  03/02/23 AROM  03/22/23 AAROM 03/28/23 AROM Standing 04/13/23 AROM Standing 05/02/23 AROM  Standing 05/11/23 AROM  Standing 05/25/23 AROM Standing 06/01/23 AROM Standing  07/06/23 AROM  Standing 07/27/23 AROM Standing 08/31/23 AROM Standing  10/03/23 AROM 10/19/23 AROM 11/01/23  Shoulder flexion 35 90 118  123 90 96 95 100 111 110 112 120 125  129 132 130 105 115 110 R 120 L 135  Shoulder extension 5                      Shoulder abduction 20 95 90 92  73 80 83 90 93 90 92 98 100 102  105 95 81 90  R 97 L 130  Shoulder adduction                       Shoulder internal rotation 15 35            40 40  42 25   R 28 L32  Shoulder external rotation 20 65 45 30  41  45 50 53 56 56 60 66 67 70 70  47 60  R 45 L 75  Elbow flexion 120                      Elbow extension 5                      Wrist flexion                       Wrist  extension  Wrist ulnar deviation                       Wrist radial deviation                       Wrist pronation                       Wrist supination                       (Blank rows = not tested)  UPPER EXTREMITY MMT:  No tested due to recent surgery  MMT Right eval Left eval  Shoulder flexion    Shoulder extension    Shoulder abduction    Shoulder adduction    Shoulder internal rotation    Shoulder external rotation    Middle trapezius    Lower trapezius    Elbow flexion    Elbow extension    Wrist flexion    Wrist extension    Wrist ulnar deviation    Wrist radial deviation    Wrist pronation    Wrist supination    Grip strength (lbs)    (Blank rows = not tested)  PALPATION:  Very tight and tender in the pectoral, upper trap, the entire right upper arm   TODAY'S TREATMENT:                                                                                                                                         DATE:  11/16/23 Nustep level 5 x 6 minutes 15# rows 20# triceps 5# biceps Wall slides Ball vs wall 2 positions STM to the left upper arm, neck, upper traps and rhomboids Gentle stretches Vaso medium pressure  11/13/23 Nustep level 5 x 6 minutes 15# row 20# triceps 5# biceps Wall slides and circles AAROM with stick STM to the left shoulder and the right upper trap Ktape to the right shoulder for support   11/09/23 Nustep L 4 15# seated row 2 sets 10 Chest Press 5# 2 sets 10 Tricep ext 20# 2 sets 10 Bicep 5# 2 sets 10 2# cane ex for ROM and strength standing Gentle PROM BIL shld STM to BIL cerv and shlds VASO     11/07/23 Nustep L 4 15# seated row 2 sets 10 Chest Press 5# 2 sets 10 US  left shld STM to the left upper trap and neck. Left shld gentle PROM Ktape X over large trigger point right upper trap and then two I strips to support shoulder Vaso low pressure 34 degrees  11/01/23 Niustep level 4 x  4 minutes 10# seated row Chest press 5# Wall slides/circles STM to the right upper trap and neck Ktape X over large trigger point right upper trap and then two I strips to support shoulder Vaso low  pressure 34 degrees  10/31/23 STM to the left UE Passive stretch to bilateral UE's all motions some holds and some contract relax, needs a lot of cues to relax, slow focused stretches today Vaso to bilateral shoulders  10/26/23 UBE 5 minutes Red tand row and extension Ball roll ups Ball vs wall 2 different positions  20# triceps STM to the left shoulder and neck Some passive stretch to bilateral pecs and biceps Vaso low pressure 34 degrees  10/25/23 UBE level 2 x 4 minutes 10# row 2x10 5# biceps  15# triceps Red tband IR Yellow tband ER Supine 2# punches with PT limiting the ROM Supine 2# serratus 2# isometric circles 1# small ROM ER/IR supine Gentle PROM avoiding pain Vaso 34 degrees low pressure  10/19/23 Discussion of the plan the surgeon gave her and the plan that I feel we will do, we measured the ROM and looked at anatomy and what the MD notes say as she had a lot of questions. See plan below Some STM to the right upper trap  10/17/23 Gentle stretch of the neck and shoulders STM to the bilateral upper traps, rhomboids, teres and right pectoral mms Vaso to the right shoulder 35 degrees medium pressure  10/12/23 UBE level 4 x 6 minutes Passive stretch to bilateral shoulders STM to the left shoulder/upper arm STM to the right upper trap, rhomboid, neck and teres. Vaso medium pressure 34 degrees  10/10/23 UBE level 3 x 6 minutes PROM right shoulder to her tolerance all motions STM to the right upper trap, neck and rhomboid Vaso right shoulder medium pressure 34 degrees  10/05/23 UBE level 4 x 5 minutes Supine chest presses Supine flexion with 2# wate bar Passive stretch gentle right shoulder STM to the right upper trap and neck Vaso medium  pressure  10/03/23 UBE level 3 x 6 minutes Supine wand chest press Supine wand flexion Passive stretch right shoulder  STM to the right upper arm and pectoral area Vaso 34 degrees right shoulder ROM measured as above PATIENT EDUCATION: Education details: poc/hep Person educated: Patient Education method: Programmer, multimedia, Facilities manager, Actor cues, Verbal cues, and Handouts Education comprehension: verbalized understanding  HOME EXERCISE PROGRAM: Access Code: [email or web address] URL: [email or web address] Date: 12/20/2022 Prepared by: Ozell Mainland  Exercises - Seated Shoulder Flexion Towel Slide at Table Top  - 2 x daily - 7 x weekly - 2 sets - 10 reps - 3 hold - Seated Shoulder External Rotation PROM on Table  - 2 x daily - 7 x weekly - 2 sets - 10 reps - 3 hold - Seated Elbow Extension and Shoulder External Rotation AAROM at Table with Towel  - 2 x daily - 7 x weekly - 2 sets - 10 reps - 3 hold - Circular Shoulder Pendulum with Table Support  - 2 x daily - 7 x weekly - 2 sets - 10 reps - 3 hold  Access Code: [email or web address] URL: [email or web address] Date: 07/13/2023 Prepared by: Ozell Mainland  Exercises - Isometric Shoulder Flexion at Wall  - 1 x daily - 7 x weekly - 1 sets - 10 reps - 3 hold - Standing Isometric Shoulder Internal Rotation at Doorway  - 1 x daily - 7 x weekly - 1 sets - 10 reps - 3 hold - Isometric Shoulder Extension at Wall  - 1 x daily - 7 x weekly - 1 sets - 10 reps - 3 hold - Isometric Shoulder Abduction at Wall  - 1 x daily - 7 x weekly -  1 sets - 10 reps - 3 hold - Standing Isometric Shoulder External Rotation with Doorway  - 1 x daily - 7 x weekly - 1 sets - 10 reps - 3 hold  ASSESSMENT:  CLINICAL IMPRESSION:  Continue to  slowly try to return to light exercises as tolerated. Patient has been doing a tremendous amount of things this week as she is putting together a family vacation and doing a lot of shopping and cooking.  I try to  continue to gently push into strength but was a little easier today as she will be very active until she leaves  Patient saw surgeon, the bone scan said did not feel that there was infection or loosening however the surgeon is not so sure, he wants to do an US  to be sure, that is scheduled for the next few weeks, since she has not been to PT in a month I re-measured her ROM, she has lost at least 15 degrees of motion and more for all motions.  She is also having more pain in the left shoulder possibly from overdoing it, because she is trying to not use the right shoulder.  She has talked about this with her primary and I suggested that she see a sports medicine MD, she called while she was here and will have appointment with him next week.  As far as the right shoulder the Surgeon stated in his notes to resume formal PT on a conservative basis to help with ROM and pain until the US  is performed   OBJECTIVE IMPAIRMENTS: cardiopulmonary status limiting activity, decreased activity tolerance, decreased endurance, decreased ROM, decreased strength, increased edema, increased muscle spasms, impaired flexibility, impaired UE functional use, improper body mechanics, postural dysfunction, and pain.  REHAB POTENTIAL: Good  CLINICAL DECISION MAKING: Evolving/moderate complexity  EVALUATION COMPLEXITY: Low   GOALS: Goals reviewed with patient? Yes  SHORT TERM GOALS: Target date: 01/01/23  Independent with initial HEP Goal status: 12/27/22 MET  LONG TERM GOALS: Target date: 03/22/23  Decrease pain 50% Goal status: progressing 11/01/23  2.  Dress without difficulty Goal status: progressing  11/01/23  3.  Do hair without difficulty Goal status: able to reach the top of head at times progressing 2/20/253/25/25 4.  Increase AROM right shoulder flexion to 130 degrees Goal status: 07/25/23 progressing 130 degrees with pain, since March has regressed but is better since march 11/01/23  5.  Increase right  shoulder ER to 60 degrees Goal status: met 07/06/23  6.  Return to water  aerobics and or gym activity Goal status:progressing met 06/01/23  PLAN:  PT FREQUENCY: 1-2x/week  PT DURATION: 12 weeks  PLANNED INTERVENTIONS: Therapeutic exercises, Therapeutic activity, Neuromuscular re-education, Balance training, Gait training, Patient/Family education, Self Care, Joint mobilization, Dry Needling, Electrical stimulation, Cryotherapy, Vasopneumatic device, and Manual therapy  PLAN FOR NEXT SESSION: will see a few more visits after her vacation and d/c   11/16/2023, 4:15 PM Doctor Phillips [facility] [address] [facility] [place], [place], [address] Phone: [phone]   Fax:  [phone]

## 2023-11-19 NOTE — Progress Notes (Signed)
 Subjective: Chief Complaint  Patient presents with   Foot Pain    Patient is here for soft tissue lesion right foot      68 year old female presents the office today with the above concerns.  States that she has been doing well she has no new concerns.  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  No recent injuries or changes otherwise that she reports.   A1c was 7.0 on August 22, 2023  Objective: AAO x3, NAD DP/PT pulses palpable bilaterally, CRT less than 3 seconds Sensation intact with Semmes Weinstein monofilament There is tenderness on the left foot along third interspace with palpable neuroma identified.  There is no area pinpoint tenderness. No open lesions otherwise. No pain with calf compression, swelling, warmth, erythema  Assessment: 68 year old female with diabetic foot exam, neuroma  Plan: -All treatment options discussed with the patient including all alternatives, risks, complications.  -From a diabetes standpoint she is doing well.  Discussed daily foot inspection. -With regards to the neuroma we discussed continuous, supportive shoe gear.  Offered steroid injection today.  Return in about 1 year (around 11/13/2024).  Veronica Galvan DPM

## 2023-11-26 ENCOUNTER — Encounter: Payer: Self-pay | Admitting: Physical Therapy

## 2023-11-28 ENCOUNTER — Encounter: Payer: Self-pay | Admitting: Physical Therapy

## 2023-11-28 ENCOUNTER — Ambulatory Visit: Admitting: Physical Therapy

## 2023-11-28 ENCOUNTER — Ambulatory Visit (INDEPENDENT_AMBULATORY_CARE_PROVIDER_SITE_OTHER): Admitting: Family

## 2023-11-28 VITALS — BP 138/76 | HR 113 | Temp 98.0°F | Resp 16 | Ht 61.0 in | Wt 196.0 lb

## 2023-11-28 DIAGNOSIS — J029 Acute pharyngitis, unspecified: Secondary | ICD-10-CM | POA: Diagnosis not present

## 2023-11-28 DIAGNOSIS — M6281 Muscle weakness (generalized): Secondary | ICD-10-CM | POA: Diagnosis not present

## 2023-11-28 DIAGNOSIS — G8929 Other chronic pain: Secondary | ICD-10-CM | POA: Diagnosis not present

## 2023-11-28 DIAGNOSIS — M79641 Pain in right hand: Secondary | ICD-10-CM | POA: Diagnosis not present

## 2023-11-28 DIAGNOSIS — M542 Cervicalgia: Secondary | ICD-10-CM | POA: Diagnosis not present

## 2023-11-28 DIAGNOSIS — R252 Cramp and spasm: Secondary | ICD-10-CM

## 2023-11-28 DIAGNOSIS — R278 Other lack of coordination: Secondary | ICD-10-CM | POA: Diagnosis not present

## 2023-11-28 DIAGNOSIS — M25641 Stiffness of right hand, not elsewhere classified: Secondary | ICD-10-CM | POA: Diagnosis not present

## 2023-11-28 DIAGNOSIS — M25511 Pain in right shoulder: Secondary | ICD-10-CM | POA: Diagnosis not present

## 2023-11-28 DIAGNOSIS — M25611 Stiffness of right shoulder, not elsewhere classified: Secondary | ICD-10-CM

## 2023-11-28 DIAGNOSIS — M25642 Stiffness of left hand, not elsewhere classified: Secondary | ICD-10-CM | POA: Diagnosis not present

## 2023-11-28 DIAGNOSIS — M79642 Pain in left hand: Secondary | ICD-10-CM | POA: Diagnosis not present

## 2023-11-28 LAB — POC COVID19 BINAXNOW: SARS Coronavirus 2 Ag: NEGATIVE

## 2023-11-28 LAB — POCT RAPID STREP A (OFFICE): Rapid Strep A Screen: NEGATIVE

## 2023-11-28 MED ORDER — AZITHROMYCIN 250 MG PO TABS: ORAL_TABLET | ORAL | 0 refills | Status: DC

## 2023-11-28 MED ORDER — PREDNISONE 20 MG PO TABS: 20.0000 mg | ORAL_TABLET | Freq: Every day | ORAL | 0 refills | Status: DC

## 2023-11-28 NOTE — Therapy (Signed)
 OUTPATIENT PHYSICAL THERAPY SHOULDER    Patient Name: Veronica Galvan MRN: [id number] DOB:1955/09/30, 68 y.o., female Today's Date: 11/28/2023  END OF SESSION:  PT End of Session - 11/28/23 1611     Visit Number 84    Date for PT Re-Evaluation 12/07/23    Authorization Type [employer] [place]    PT Start Time 1608    PT Stop Time 1708    PT Time Calculation (min) 60 min    Activity Tolerance Patient tolerated treatment well    Behavior During Therapy WFL for tasks assessed/performed          Past Medical History:  Diagnosis Date   Allergy     Anxiety    Arthritis    hands   Cataract    Diabetes mellitus without complication (HCC)    type 2   Family history of adverse reaction to anesthesia    son has malignant hyperthermia, daughter does not daughter recently had c section without problems   GERD (gastroesophageal reflux disease)    Headache    sinus   Hyperlipidemia    Hypertension    PONV (postoperative nausea and vomiting)    nausea only   Ulcer, stomach peptic yrs ago   Past Surgical History:  Procedure Laterality Date   APPENDECTOMY     both hells bone spur repair     both heels with metal clips   both shoulder rotator cuff repair     CESAREAN SECTION     x 1   COLONOSCOPY WITH PROPOFOL  N/A 09/07/2016   Procedure: COLONOSCOPY WITH PROPOFOL ;  Surgeon: Vicci Gladis POUR, MD;  Location: [facility];  Service: Endoscopy;  Laterality: N/A;   colonscopy  06/2011   polyps   ELBOW FRACTURE SURGERY Left    EYE SURGERY     FRACTURE SURGERY     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/10/2022   Procedure: REVERSE SHOULDER ARTHROPLASTY;  Surgeon: Melita Drivers, MD;  Location: [facility];  Service: Orthopedics;  Laterality: Right;  Please follow in room 6 if able   TUBAL LIGATION     VESICO-VAGINAL FISTULA REPAIR     Patient Active Problem List   Diagnosis Date Noted   Gastroesophageal reflux disease 11/16/2021   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 08/18/2021   Dermatofibroma 08/18/2021   History of malignant neoplasm of skin 08/18/2021   Lentigo 08/18/2021   Melanocytic nevi of trunk 08/18/2021   Nevus lipomatosus cutaneous superficialis 08/18/2021   Rosacea 08/18/2021   Actinic keratosis 08/18/2021   Sensorineural hearing loss (SNHL) of left ear with unrestricted hearing of right ear 07/26/2021   Tinnitus of left ear 07/26/2021   Acute recurrent maxillary sinusitis 06/22/2021   Primary hypertension 01/12/2021   Hyperlipidemia associated with type 2 diabetes mellitus (HCC) 01/12/2021   Arthritis 01/12/2021   Type II diabetes mellitus (HCC) 11/25/2019   Ingrown toenail 09/05/2018   Neck pain 01/29/2018   Tick bite 10/24/2017    PCP: [name], [name]  REFERRING PROVIDER: Melita, MD  REFERRING DIAG: s/p right reverse TSA  THERAPY DIAG:  Muscle weakness (generalized)  Chronic right shoulder pain  Stiffness of right shoulder, not elsewhere classified  Spasm  Cervicalgia  Rationale for Evaluation and Treatment: Rehabilitation  ONSET DATE: 11/05/22  SUBJECTIVE:  SUBJECTIVE STATEMENT:  Patient saw [name] today, is looking to get a CT scan, she was at the beach the past week.  She feels that she is in more pain now than prior to the beach trip.  Patient reports that she saw the surgeon she is frustrated and upset as she feels that she still has no answers, it is felt that there is no loosening and no infection.  She is still hurting with basic ADL's, her AROM decreased beginning in March, as she started having increased pain in mid January after a very busy and active few month, she has an order for the left shoulder and the okay for us  to see her for her right shoulder for another month PAIN:  Are you having pain? Yes: NPRS scale:  5/10 Pain location: right shoulder, HA Pain description: dull ache at rest, sharp with motions Aggravating factors: quick motions, movements pain up to 10/10 Relieving factors: ice, rest, Tylenol  at best 3/10  PRECAUTIONS: Shoulder Protocol in the chart  RED FLAGS: None   WEIGHT BEARING RESTRICTIONS: No  FALLS:  Has patient fallen in last 6 months? Yes. Number of falls 1  LIVING ENVIRONMENT: Lives with: lives alone Lives in: House/apartment Stairs: No Has following equipment at home: None  OCCUPATION: retired  PLOF: Independent  PATIENT GOALS:dress without difficulty, do hair, have good ROM and less pain  NEXT MD VISIT:   OBJECTIVE:   DIAGNOSTIC FINDINGS:  See above  PATIENT SURVEYS:  FOTO 21  COGNITION: Overall cognitive status: Within functional limits for tasks assessed     SENSATION: WFL  POSTURE: Fwd head, rounded shoulders, elevated and guarded shoulder  UPPER EXTREMITY ROM:   Active ROM Right PROM eval Left AROM  08/31/23 PROM Supine 01/05/23 PROM  01/26/23 PROM 02/09/23 AROM sitting 02/14/23 AROM 02/21/23 AROM  03/02/23 AROM  03/22/23 AAROM 03/28/23 AROM Standing 04/13/23 AROM Standing 05/02/23 AROM  Standing 05/11/23 AROM  Standing 05/25/23 AROM Standing 06/01/23 AROM Standing  07/06/23 AROM  Standing 07/27/23 AROM Standing 08/31/23 AROM Standing  10/03/23 AROM 10/19/23 AROM 11/01/23  Shoulder flexion 35 90 118  123 90 96 95 100 111 110 112 120 125  129 132 130 105 115 110 R 120 L 135  Shoulder extension 5                      Shoulder abduction 20 95 90 92  73 80 83 90 93 90 92 98 100 102  105 95 81 90  R 97 L 130  Shoulder adduction                       Shoulder internal rotation 15 35            40 40  42 25   R 28 L32  Shoulder external rotation 20 65 45 30  41  45 50 53 56 56 60 66 67 70 70  47 60  R 45 L 75  Elbow flexion 120                      Elbow extension 5                      Wrist flexion                       Wrist  extension  Wrist ulnar deviation                       Wrist radial deviation                       Wrist pronation                       Wrist supination                       (Blank rows = not tested)  UPPER EXTREMITY MMT:  No tested due to recent surgery  MMT Right eval Left eval  Shoulder flexion    Shoulder extension    Shoulder abduction    Shoulder adduction    Shoulder internal rotation    Shoulder external rotation    Middle trapezius    Lower trapezius    Elbow flexion    Elbow extension    Wrist flexion    Wrist extension    Wrist ulnar deviation    Wrist radial deviation    Wrist pronation    Wrist supination    Grip strength (lbs)    (Blank rows = not tested)  PALPATION:  Very tight and tender in the pectoral, upper trap, the entire right upper arm   TODAY'S TREATMENT:                                                                                                                                         DATE:  11/28/23 STM to the right and left upper trap, rhomboid and neck, STM to the right upper arm. Gentle PROM Vaso medium pressure to the right shoulder  11/16/23 Nustep level 5 x 6 minutes 15# rows 20# triceps 5# biceps Wall slides Ball vs wall 2 positions STM to the left upper arm, neck, upper traps and rhomboids Gentle stretches Vaso medium pressure  11/13/23 Nustep level 5 x 6 minutes 15# row 20# triceps 5# biceps Wall slides and circles AAROM with stick STM to the left shoulder and the right upper trap Ktape to the right shoulder for support   11/09/23 Nustep L 4 15# seated row 2 sets 10 Chest Press 5# 2 sets 10 Tricep ext 20# 2 sets 10 Bicep 5# 2 sets 10 2# cane ex for ROM and strength standing Gentle PROM BIL shld STM to BIL cerv and shlds VASO     11/07/23 Nustep L 4 15# seated row 2 sets 10 Chest Press 5# 2 sets 10 US  left shld STM to the left upper trap and neck. Left shld gentle  PROM Ktape X over large trigger point right upper trap and then two I strips to support shoulder Vaso low pressure 34 degrees  11/01/23 Niustep level 4 x 4 minutes 10# seated row Chest press 5# Wall  slides/circles STM to the right upper trap and neck Ktape X over large trigger point right upper trap and then two I strips to support shoulder Vaso low pressure 34 degrees  10/31/23 STM to the left UE Passive stretch to bilateral UE's all motions some holds and some contract relax, needs a lot of cues to relax, slow focused stretches today Vaso to bilateral shoulders  10/26/23 UBE 5 minutes Red tand row and extension Ball roll ups Ball vs wall 2 different positions  20# triceps STM to the left shoulder and neck Some passive stretch to bilateral pecs and biceps Vaso low pressure 34 degrees  10/25/23 UBE level 2 x 4 minutes 10# row 2x10 5# biceps  15# triceps Red tband IR Yellow tband ER Supine 2# punches with PT limiting the ROM Supine 2# serratus 2# isometric circles 1# small ROM ER/IR supine Gentle PROM avoiding pain Vaso 34 degrees low pressure  10/19/23 Discussion of the plan the surgeon gave her and the plan that I feel we will do, we measured the ROM and looked at anatomy and what the MD notes say as she had a lot of questions. See plan below Some STM to the right upper trap  10/17/23 Gentle stretch of the neck and shoulders STM to the bilateral upper traps, rhomboids, teres and right pectoral mms Vaso to the right shoulder 35 degrees medium pressure  10/12/23 UBE level 4 x 6 minutes Passive stretch to bilateral shoulders STM to the left shoulder/upper arm STM to the right upper trap, rhomboid, neck and teres. Vaso medium pressure 34 degrees  10/10/23 UBE level 3 x 6 minutes PROM right shoulder to her tolerance all motions STM to the right upper trap, neck and rhomboid Vaso right shoulder medium pressure 34 degrees  10/05/23 UBE level 4 x 5  minutes Supine chest presses Supine flexion with 2# wate bar Passive stretch gentle right shoulder STM to the right upper trap and neck Vaso medium pressure  10/03/23 UBE level 3 x 6 minutes Supine wand chest press Supine wand flexion Passive stretch right shoulder  STM to the right upper arm and pectoral area Vaso 34 degrees right shoulder ROM measured as above PATIENT EDUCATION: Education details: poc/hep Person educated: Patient Education method: Programmer, multimedia, Facilities manager, Actor cues, Verbal cues, and Handouts Education comprehension: verbalized understanding  HOME EXERCISE PROGRAM: Access Code: [email or web address] URL: [email or web address] Date: 12/20/2022 Prepared by: Ozell Mainland  Exercises - Seated Shoulder Flexion Towel Slide at Table Top  - 2 x daily - 7 x weekly - 2 sets - 10 reps - 3 hold - Seated Shoulder External Rotation PROM on Table  - 2 x daily - 7 x weekly - 2 sets - 10 reps - 3 hold - Seated Elbow Extension and Shoulder External Rotation AAROM at Table with Towel  - 2 x daily - 7 x weekly - 2 sets - 10 reps - 3 hold - Circular Shoulder Pendulum with Table Support  - 2 x daily - 7 x weekly - 2 sets - 10 reps - 3 hold  Access Code: [email or web address] URL: [email or web address] Date: 07/13/2023 Prepared by: Ozell Mainland  Exercises - Isometric Shoulder Flexion at Wall  - 1 x daily - 7 x weekly - 1 sets - 10 reps - 3 hold - Standing Isometric Shoulder Internal Rotation at Doorway  - 1 x daily - 7 x weekly - 1 sets - 10 reps - 3 hold - Isometric Shoulder Extension at Wall  - 1 x daily -  7 x weekly - 1 sets - 10 reps - 3 hold - Isometric Shoulder Abduction at Wall  - 1 x daily - 7 x weekly - 1 sets - 10 reps - 3 hold - Standing Isometric Shoulder External Rotation with Doorway  - 1 x daily - 7 x weekly - 1 sets - 10 reps - 3 hold  ASSESSMENT:  CLINICAL IMPRESSION:  Patient is returning from a full week at the beach, she is fatigued and very  sore, she reports that she is still hurting and not much change, she did see [name] today and they are planning to get a CT of the right shoulder.  She does report that the left shoulder did better with the injection that she received prior to the trip  Patient saw surgeon, the bone scan said did not feel that there was infection or loosening however the surgeon is not so sure, he wants to do an US  to be sure, that is scheduled for the next few weeks, since she has not been to PT in a month I re-measured her ROM, she has lost at least 15 degrees of motion and more for all motions.  She is also having more pain in the left shoulder possibly from overdoing it, because she is trying to not use the right shoulder.  She has talked about this with her primary and I suggested that she see a sports medicine MD, she called while she was here and will have appointment with him next week.  As far as the right shoulder the Surgeon stated in his notes to resume formal PT on a conservative basis to help with ROM and pain until the US  is performed   OBJECTIVE IMPAIRMENTS: cardiopulmonary status limiting activity, decreased activity tolerance, decreased endurance, decreased ROM, decreased strength, increased edema, increased muscle spasms, impaired flexibility, impaired UE functional use, improper body mechanics, postural dysfunction, and pain.  REHAB POTENTIAL: Good  CLINICAL DECISION MAKING: Evolving/moderate complexity  EVALUATION COMPLEXITY: Low   GOALS: Goals reviewed with patient? Yes  SHORT TERM GOALS: Target date: 01/01/23  Independent with initial HEP Goal status: 12/27/22 MET  LONG TERM GOALS: Target date: 03/22/23  Decrease pain 50% Goal status: progressing 11/01/23  2.  Dress without difficulty Goal status: progressing  11/01/23  3.  Do hair without difficulty Goal status: able to reach the top of head at times progressing 2/20/253/25/25 4.  Increase AROM right shoulder flexion to 130  degrees Goal status: 07/25/23 progressing 130 degrees with pain, since March has regressed but is better since march 11/01/23  5.  Increase right shoulder ER to 60 degrees Goal status: met 07/06/23  6.  Return to water  aerobics and or gym activity Goal status:progressing met 06/01/23  PLAN:  PT FREQUENCY: 1-2x/week  PT DURATION: 12 weeks  PLANNED INTERVENTIONS: Therapeutic exercises, Therapeutic activity, Neuromuscular re-education, Balance training, Gait training, Patient/Family education, Self Care, Joint mobilization, Dry Needling, Electrical stimulation, Cryotherapy, Vasopneumatic device, and Manual therapy  PLAN FOR NEXT SESSION: will see a few more visits  and d/c   11/28/2023, 4:13 PM [facility] [address] [facility] [place], [place], [address] Phone: [phone]   Fax:  [phone]

## 2023-11-28 NOTE — Progress Notes (Signed)
 Veronica Galvan is a 68 y.o. 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   Rosacea 08/18/2021   Actinic keratosis 08/18/2021   Sensorineural hearing loss (SNHL) of left ear with unrestricted hearing of right ear 07/26/2021   Tinnitus of left ear 07/26/2021   Acute recurrent maxillary sinusitis 06/22/2021   Primary hypertension 01/12/2021   Hyperlipidemia associated with type 2 diabetes mellitus (HCC) 01/12/2021   Arthritis 01/12/2021   Type II diabetes mellitus (HCC) 11/25/2019   Ingrown toenail 09/05/2018   Neck pain 01/29/2018   Tick bite 10/24/2017    Current Outpatient Medications  Medication Sig Dispense Refill   ALPRAZolam  (XANAX ) 0.5 MG tablet Take 1 tablet (0.5 mg total) by mouth at bedtime as needed for anxiety. 30 tablet 0   Ascorbic Acid (VITAMIN C) 1000 MG tablet Take 1,000 mg by mouth every morning.     aspirin EC 81 MG tablet Take 81 mg by mouth every morning.     atorvastatin  (LIPITOR) 40 MG tablet TAKE ONE TABLET BY MOUTH EVERY EVENING 90 tablet 1   azithromycin  (ZITHROMAX ) 250 MG tablet Take 2 tab po the first day then take 1 tablet po daily for 4 days 6 tablet 0   Blood Glucose Monitoring Suppl DEVI 1 each by Does not apply route in the morning, at noon, and at bedtime. May substitute to any manufacturer covered by patient's insurance. 1 each 0   Calcium  Carb-Cholecalciferol (CALCIUM  600+D3 PO) Take 1 tablet by mouth every morning.     calcium  carbonate (TUMS - DOSED IN MG ELEMENTAL CALCIUM ) 500 MG chewable tablet Chew 2 tablets by mouth daily as needed for indigestion or heartburn.      carboxymethylcellulose (REFRESH TEARS) 0.5 % SOLN Place 1 drop into both eyes 2 (two) times daily.     celecoxib  (CELEBREX ) 200 MG capsule TAKE 1 CAPSULE BY MOUTH DAILY 90 capsule 3   Coenzyme Q10 300 MG CAPS Take 1 capsule by mouth every morning.     diclofenac Sodium (VOLTAREN) 1 % GEL Apply 1 Application topically 2 (two) times daily.     fluticasone  (FLONASE ) 50 MCG/ACT nasal spray Place 2 sprays into both nostrils daily. 16 g 6   glucose blood test strip Use as instructed 100 each 12   levocetirizine (XYZAL ) 5 MG tablet Take 1 tablet (5 mg total) by mouth every evening. 90 tablet 1   lisinopril  (ZESTRIL ) 5 MG tablet TAKE 1 TABLET BY MOUTH 2 TIMES A DAY 180 tablet 1   LUTEIN-ZEAXANTHIN PO Take 1 tablet by mouth every morning.     MAGNESIUM CITRATE PO Take 500 mg by mouth daily.     Multiple Vitamins-Minerals (MULTIVITAMIN WITH MINERALS) tablet Take 1 tablet by mouth every morning.     Omega-3 Fatty Acids (FISH OIL) 1000 MG CAPS Take 2,000 mg by mouth daily.     omeprazole  (PRILOSEC) 20 MG capsule TAKE 1 CAPSULE BY MOUTH EVERY MORNING 90 capsule 1   predniSONE  (DELTASONE ) 20 MG tablet Take 1 tablet (20 mg total) by mouth daily with breakfast. 5 tablet 0   RESTASIS 0.05 % ophthalmic emulsion Apply 1 drop to eye.  Semaglutide , 2 MG/DOSE, 8 MG/3ML SOPN Inject 2 mg as directed once a week. 9 mL 0   sodium chloride  (MURO 128) 2 % ophthalmic solution Place 1 drop into both eyes 2 (two) times daily.     SYNJARDY  XR 12.08-998 MG TB24 Take 1 tablet by mouth 2 (two) times daily. 180 tablet 0   TURMERIC PO Take 2,000 mg by mouth daily.     UNABLE TO FIND Take 1 tablet by mouth daily. Cinsulin supplement     vitamin E 180 MG (400 UNITS) capsule Take 400 Units by mouth daily.     polyethylene glycol powder (GLYCOLAX /MIRALAX ) 17 GM/SCOOP powder Take 238 g by mouth daily. (Patient not taking: Reported on 11/28/2023) 255 g 3   No current facility-administered medications for this visit.    Allergies:  Codeine, Benzonatate, and Epinephrine  Past Medical History:  Diagnosis Date   Allergy     Anxiety    Arthritis    hands   Cataract    Diabetes mellitus without complication (HCC)    type 2   Family history of adverse reaction to anesthesia    son has malignant hyperthermia, daughter does not daughter recently had c section without problems   GERD (gastroesophageal reflux disease)    Headache    sinus   Hyperlipidemia    Hypertension    PONV (postoperative nausea and vomiting)    nausea only   Ulcer, stomach peptic yrs ago    Past Surgical History:  Procedure Laterality Date   APPENDECTOMY     both hells bone spur repair     both heels with metal clips   both shoulder rotator cuff repair     CESAREAN SECTION     x 1   COLONOSCOPY WITH PROPOFOL  N/A 09/07/2016   Procedure: COLONOSCOPY WITH PROPOFOL ;  Surgeon: Vicci Gladis POUR, MD;  Location: [facility];  Service: Endoscopy;  Laterality: N/A;   colonscopy  06/2011   polyps   ELBOW FRACTURE SURGERY Left    EYE SURGERY     FRACTURE SURGERY     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/10/2022   Procedure: REVERSE SHOULDER ARTHROPLASTY;  Surgeon: Melita Drivers, MD;  Location: [facility];  Service: Orthopedics;  Laterality: Right;  Please follow in room 6 if able   TUBAL LIGATION     VESICO-VAGINAL FISTULA REPAIR      Family History  Problem Relation Age of Onset   Hypertension Mother    COPD Mother    Cancer Mother    Arthritis Mother    Hypertension Father    Hyperlipidemia Father    Diabetes Father    Cancer Father    Alcohol abuse Father    Diabetes Brother    Alcohol abuse Brother    Hypertension Brother    Arthritis Maternal Grandmother    Birth defects Maternal Grandmother    Diabetes Paternal Grandmother    Heart disease Paternal Grandfather    Diabetes Paternal Aunt    Diabetes Paternal Aunt    Obesity Son     Social History   Tobacco Use   Smoking status: Former    Current packs/day: 1.00     Average packs/day: 1 pack/day for 14.0 years (14.0 ttl pk-yrs)    Types: Cigarettes   Smokeless tobacco: Never   Tobacco comments:    quit 31 yrs ago  Substance Use Topics   Alcohol use: Yes    Comment: rare    Subjective:   Sore throat x 2-3  days; concerned about recent exposure to strep; no fever; started with drainage this morning;   Objective:  Vitals:   11/28/23 1255  BP: 138/76  Pulse: (!) 113  Resp: 16  Temp: 98 F (36.7 C)  SpO2: 94%  Weight: 196 lb (88.9 kg)  Height: 5' 1 (1.549 m)    General: Well developed, well nourished, in no acute distress  Skin : Warm and dry.  Head: Normocephalic and atraumatic  Eyes: Sclera and conjunctiva clear; pupils round and reactive to light; extraocular movements intact  Ears: External normal; canals clear; tympanic membranes normal  Oropharynx: Pink, supple. Erythema/ pustular lesions noted; Neck: Supple without thyromegaly, adenopathy  Lungs: Respirations unlabored;  Neurologic: Alert and oriented; speech intact; face symmetrical; moves all extremities well; CNII-XII intact without focal deficit   Assessment:  1. Sore throat     Plan:  Rapid strep is negative but physical exam is concerning; will go ahead and treat with antibiotics; Rx for Z-pak, prednisone ; she will use her albuterol  inhaler as needed; increase fluids, rest and follow up worse, no better.   No follow-ups on file.  Orders Placed This Encounter  Procedures   POCT rapid strep A   POC COVID-19 BinaxNow    Requested Prescriptions   Signed Prescriptions Disp Refills   azithromycin  (ZITHROMAX ) 250 MG tablet 6 tablet 0    Sig: Take 2 tab po the first day then take 1 tablet po daily for 4 days   predniSONE  (DELTASONE ) 20 MG tablet 5 tablet 0    Sig: Take 1 tablet (20 mg total) by mouth daily with breakfast.

## 2023-11-29 ENCOUNTER — Ambulatory Visit: Admitting: Occupational Therapy

## 2023-11-29 ENCOUNTER — Encounter: Payer: Self-pay | Admitting: Orthopaedic Surgery

## 2023-11-29 ENCOUNTER — Other Ambulatory Visit: Payer: Self-pay | Admitting: Orthopaedic Surgery

## 2023-11-29 DIAGNOSIS — M79642 Pain in left hand: Secondary | ICD-10-CM

## 2023-11-29 DIAGNOSIS — G8929 Other chronic pain: Secondary | ICD-10-CM | POA: Diagnosis not present

## 2023-11-29 DIAGNOSIS — M79641 Pain in right hand: Secondary | ICD-10-CM

## 2023-11-29 DIAGNOSIS — M25641 Stiffness of right hand, not elsewhere classified: Secondary | ICD-10-CM | POA: Diagnosis not present

## 2023-11-29 DIAGNOSIS — R278 Other lack of coordination: Secondary | ICD-10-CM

## 2023-11-29 DIAGNOSIS — M6281 Muscle weakness (generalized): Secondary | ICD-10-CM | POA: Diagnosis not present

## 2023-11-29 DIAGNOSIS — R252 Cramp and spasm: Secondary | ICD-10-CM | POA: Diagnosis not present

## 2023-11-29 DIAGNOSIS — M25611 Stiffness of right shoulder, not elsewhere classified: Secondary | ICD-10-CM | POA: Diagnosis not present

## 2023-11-29 DIAGNOSIS — M25642 Stiffness of left hand, not elsewhere classified: Secondary | ICD-10-CM | POA: Diagnosis not present

## 2023-11-29 DIAGNOSIS — M25511 Pain in right shoulder: Secondary | ICD-10-CM | POA: Diagnosis not present

## 2023-11-29 DIAGNOSIS — M542 Cervicalgia: Secondary | ICD-10-CM | POA: Diagnosis not present

## 2023-11-29 NOTE — Therapy (Signed)
 OUTPATIENT OCCUPATIONAL THERAPY ORTHO Treatment  Patient Name: Veronica Galvan MRN: [id number] DOB:Oct 30, 1955, 68 y.o., female Today's Date: 11/29/2023  PCP: Leita Eliza Elbe [name] REFERRING PROVIDER: Leita Eliza Elbe [name]  END OF SESSION:  OT End of Session - 11/29/23 1613     Visit Number 32    Number of Visits 35    Date for OT Re-Evaluation 12/13/23    Authorization Type [employer] MCR    Authorization Time Period 12 weeks    Authorization - Visit Number 32    Progress Note Due on Visit 40    OT Start Time 1531    OT Stop Time 1615    OT Time Calculation (min) 44 min    Activity Tolerance Patient tolerated treatment well    Behavior During Therapy WFL for tasks assessed/performed                            Past Medical History:  Diagnosis Date   Allergy     Anxiety    Arthritis    hands   Cataract    Diabetes mellitus without complication (HCC)    type 2   Family history of adverse reaction to anesthesia    son has malignant hyperthermia, daughter does not daughter recently had c section without problems   GERD (gastroesophageal reflux disease)    Headache    sinus   Hyperlipidemia    Hypertension    PONV (postoperative nausea and vomiting)    nausea only   Ulcer, stomach peptic yrs ago   Past Surgical History:  Procedure Laterality Date   APPENDECTOMY     both hells bone spur repair     both heels with metal clips   both shoulder rotator cuff repair     CESAREAN SECTION     x 1   COLONOSCOPY WITH PROPOFOL  N/A 09/07/2016   Procedure: COLONOSCOPY WITH PROPOFOL ;  Surgeon: Vicci Gladis POUR, MD;  Location: [facility];  Service: Endoscopy;  Laterality: N/A;   colonscopy  06/2011   polyps   ELBOW FRACTURE SURGERY Left    EYE SURGERY     FRACTURE SURGERY     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/10/2022   Procedure: REVERSE SHOULDER ARTHROPLASTY;  Surgeon: Melita Drivers, MD;  Location: [facility];  Service: Orthopedics;   Laterality: Right;  Please follow in room 6 if able   TUBAL LIGATION     VESICO-VAGINAL FISTULA REPAIR     Patient Active Problem List   Diagnosis Date Noted   Gastroesophageal reflux disease 11/16/2021   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 08/18/2021   Dermatofibroma 08/18/2021   History of malignant neoplasm of skin 08/18/2021   Lentigo 08/18/2021   Melanocytic nevi of trunk 08/18/2021   Nevus lipomatosus cutaneous superficialis 08/18/2021   Rosacea 08/18/2021   Actinic keratosis 08/18/2021   Sensorineural hearing loss (SNHL) of left ear with unrestricted hearing of right ear 07/26/2021   Tinnitus of left ear 07/26/2021   Acute recurrent maxillary sinusitis 06/22/2021   Primary hypertension 01/12/2021   Hyperlipidemia associated with type 2 diabetes mellitus (HCC) 01/12/2021   Arthritis 01/12/2021   Type II diabetes mellitus (HCC) 11/25/2019   Ingrown toenail 09/05/2018   Neck pain 01/29/2018   Tick bite 10/24/2017    ONSET DATE: 05/19/23  REFERRING DIAG:  Diagnosis  M79.641,M79.642 (ICD-10-CM) - Bilateral hand pain    THERAPY DIAG:  Muscle weakness (generalized)  Stiffness of right shoulder, not elsewhere classified  Stiffness of left hand, not elsewhere classified  Stiffness of right hand, not elsewhere classified  Pain in right hand  Pain in left hand  Other lack of coordination  Rationale for Evaluation and Treatment: Rehabilitation  SUBJECTIVE:   SUBJECTIVE STATEMENT: Pt reports her hands and shoulders are sore today Pt accompanied by: self  PERTINENT HISTORY: 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 Yes: NPRS scale: 3/10 Pain location: right hand,  left hand Pain description: aching,  stiff Aggravating factors: gripping  Relieving factors: heat, meds Pt also has shoulder pain which is more significant grossly R shoulder 7/10 L shoulder pain 3/10 which OT will not address. PT is addressing shoulder FALLS: Has patient fallen in last 6 months? No  LIVING ENVIRONMENT: Lives with: lives alone Lives in: House/apartment Stairs: yes   PLOF: Independent  PATIENT GOALS: improve functional use of hands and learn adapted strategies for ADLs/IADLs.  NEXT MD VISIT: unknown  OBJECTIVE:  Note: Objective measures were completed at Evaluation unless otherwise noted.  HAND DOMINANCE: Right  ADLs: Overall ADLs: unable to grip credit card to remove from ATM Transfers/ambulation related to ADLs: mod I Eating: drops silverware Grooming: drops toothbrush, uses electric toothbrush, holding comb Upper body dressing: adjusting bra Lower body dressing: difficulty pulling up pants  Toileting: hygeine was difficult initally Bathing: mod I, difficult with shaving Tub shower transfers: walk in shower    FUNCTIONAL OUTCOME MEASURES: Quick Dash: 05/30/23- 75%% disability Quick Dash 08/15/23- 70.5% disability   UPPER EXTREMITY ROM:   RUE wrist flexion/ extension: 70/ 50, LUE wrist flexion/ extension: 70/ 45 Pt demonstrates grossly 50% composite flexion for right and 55 with left.   Active ROM Right eval Left eval  Thumb MCP (0-60) 45 55  Thumb IP (0-80) 25 10  Thumb Radial abd/add (0-55)     Thumb Palmar abd/add (0-45)     Thumb Opposition to Small Finger yes yes   Index MCP (0-90)     Index PIP (0-100)     Index DIP (0-70)      Long MCP (0-90)      Long PIP (0-100)      Long DIP (0-70)      Ring MCP (0-90)      Ring PIP (0-100)      Ring DIP (0-70)      Little MCP (0-90)      Little PIP (0-100)      Little DIP (0-70)      (Blank rows = not tested) 9 hole peg test (08/15/23) RUE 26.70 secs, LUE 23 secs  HAND FUNCTION: Grip strength: Right: 8 lbs; Left: 4 lbs,           08/15/23-  RUE 18, LUE 14 lbs,           08/15/23   Pinch: RUE tip 4lbs, lateral 8 lbs, LUE tip 3 lbs, lateral 8 lbs   SENSATION: Light touch: Impaired index finger LUE  EDEMA: Pt with bony defomities at PIP joints of all digits which pt reports are bone spurs, Pt also has bony deformity at DIP for right thumb, index and 5th digit and LUE small and index fingers  COGNITION: Overall cognitive status: Within functional limits for tasks assessed  OBSERVATIONS: Pleasant female desires to increase functional use of bilateral UE's  , TREATMENT DATE:11/29/23-Pt reports her hand pain is better and she used her foam grips at the beach along with rocker knife. Paraffin to RUE x 8 mins, no adverse reactions, while therapist performed US  to left hand 3.3 mhz, 0.8 w/cm 2,  20%x 8 mins no adverse reactions for stiffness. Paraffin to LUE x 8 mins, no adverse reactions, while therapist performed US  to right hand 3.3 mhz, 0.8 w/cm 2,  20%x 8 mins no adverse reactions for stiffness.  Soft tissue and joint mobs bilateral hands and wrists and forearms.   Passive finger flexion to bilateral hands RUE individual digits , LUE compositely.SABRA Squeeze ball green tree composite flexion with RUE.  11/16/23-Paraffin to RUE x 8 mins, no adverse reactions, while therapist performed US  to left hand 3.3 mhz, 0.8 w/cm 2,  20%x 8 mins no adverse reactions for stiffness. Paraffin to LUE x 8 mins, no adverse reactions, while therapist performed US  to right hand 3.3 mhz, 0.8 w/cm 2,  20%x 8 mins no adverse reactions for stiffness.  Soft tissue and joint mobs bilateral hands and wrists.  Passive finger flexion to bilateral hands. Digi flex light restance for LUE indivisual and composite finger flexion. Squeeze ball green tree composite flexion with RUE. Discussed using a fork at home with difficulty, therapist recommends use of a foam grip.  11/07/23-Paraffin to RUE x 8 mins, no adverse reactions, while therapist performed US  to  left hand 3.3 mhz, 0.8 w/cm 2,  20%x 8 mins no adverse reactions for stiffness. Paraffin to LUE x 8 mins, no adverse reactions, while therapist performed US  to right hand 3.3 mhz, 0.8 w/cm 2,  20%x 8 mins no adverse reactions for stiffness.  Passive finger flexion to bilateral hands. Therapist checked progress towards goals for PN/renewal as pt reports therapy has really helped her functional use of bilateral hands.  PATIENT EDUCATION: Education details:   see above Person educated: Patient Education method: Explanation, demonstration, v.c Education comprehension: verbalized understanding, returned demonstration  HOME EXERCISE PROGRAM: AROM yellow putty  GOALS: Goals reviewed with patient? Yes  SHORT TERM GOALS:   I with HEP  Goal status:  met 06/07/23  2.  I with positioning/ splinting prn to minimize pain and defomity   Goal status: met, 07/13/23  3.  Pt will increase bilateral grip strength by 5 lbs for increased UE functional use. Upgraded goal Pt will increase bilateral grip strength to at least 25 lbs (after stretching). Baseline: 05/30/23- RUE 8 lbs, LUE 4 lbs Goal status:met 08/01/23-R 20 lbs, L  19 lbs,  08/15/23- RUE 18 lbsLUE 14 lbs continue to address upgraded goal  - RUE 19 lbs, LUE 24- 10/03/23  RUE 23 lbs LUE 25 lbs, met for LUE, ongoing for right. 10/17/23 11/07/23-RUE 20 lbs, LUE 30 lbs, met for LUE only, not met for RUE due to shoulder pain  4. Pt will demonstrate improved RUE fine motor coordination as evidenced by perfroming 9 hole peg test in 23 secs or less.  Goal status:10/17/23, 26.68- 10/24/23, 10/08/23-27.42, not met due to shoulder pain which impedes RUE function  5. I with updates to HEP.  Goal status: ongoing-continue as long term goal- 11/07/23  6. Pt will demonstrate improved composite finger flexion for ADLs as evidenced by pt. bringing middle fingertip for RUE within 1/2 an inch of palm.  Baseline: 3/4 inch from palm to middle fingertip  Goal status  not met  3/4 inch  09/19/23, 1 inch from palm 10/24/23, 11/07/23- 1 inch, not met  7.Pt will demonstrate improved composite finger flexion for ADLs as evidenced by pt. bringing middle fingertip for LUE within 3/4 an inch of palm.  Baseline: 1 inch from palm to middle finger tip  Goal status: met, 3/4 inch away from middle finger 09/19/23  LONG TERM GOALS: Target date: 12/13/23  I with updated HEP  Goal status:met 08/15/22  2.  Pt will improve Quick Dash score to 70% disability Baseline: 75% (05/30/23) Goal status:   improved - however not fully met due to complications from shoulder pain-70.5% 08/15/23  3.  Pt will demonstrate at least 65% composite flexion for LUE for increased functional use.  Goal status: met 65-70% , 08/15/23  4.  I with adapted strategies/ adapted equipment to minimize pain and to increase pt I with ADLs/IADLs  Goal status:  met for inital strategies, 11/16/23 ongoing, for additional strategies such as picking hair.  5.  Pt will demonstrate at least 65% composite flexion for RUE for increased functional use.  Goal status: met 70% 08/15/23  6. Pt will report that she is able to cut her meat such as steak or chicken modified independently.  [place]: unable  Goal status: met 09/21/23    7. Pt will report increased ease with opening/ closing ziplock bags.    Goal status: met 09/21/23    8. Pt will improve bilateral tip pinch by 2 lbs(from baseline) for increased ease with daily activities.                                baseline: RUE tip 4lbs, LUE tip 3 lbs,    Goal status:  RUE 4 lbs, LUE 4 lbs,   ongoing 11/07/23    9. Pt will improve bilateral lateral pinch by 2 lbs for increased ease with ADLs.    Goal status: met RUE 12 lbs, LUE 14- 11/07/23   10 I with updates to HEP    goal status: new 11.Pt will demonstrate improved composite finger flexion for ADLs as evidenced by pt. bringing middle fingertip for RUE within 3/4 an inch of palm   following stretching. Baseline: 11/07/23- 1  inch from palm Goal status: ongoing 11/16/23    12. Pt willl increase RUE grip strength to 25 lbs for increased functional use. Baseline7/8/21- 20 lbs Goal status:RUE 28 lbs met- 11/29/23, (LUE 27 lbs) ASSESSMENT: CLINICAL IMPRESSION: Pt is progressing towards goals. She reports decrease overall pain. She met long term goal #12 with grip strength of 28 lbs. PERFORMANCE DEFICITS: in functional skills including ADLs, IADLs, coordination, sensation, edema, ROM, strength, pain, flexibility, Fine motor control, Gross motor control, endurance, decreased knowledge of precautions, decreased knowledge of use of DME, and UE functional use,, and psychosocial skills including coping strategies, environmental adaptation, habits, interpersonal interactions, and routines and behaviors.   IMPAIRMENTS: are limiting patient from ADLs, IADLs, rest and sleep, education, play, leisure, and social participation.   COMORBIDITIES: may have co-morbidities  that affects occupational performance. Patient will benefit from skilled OT to address above impairments and improve overall function.  MODIFICATION OR ASSISTANCE TO COMPLETE EVALUATION: No modification of tasks or assist necessary to complete an evaluation.  OT OCCUPATIONAL PROFILE AND HISTORY: Detailed assessment: Review of records and additional review of physical, cognitive, psychosocial history related to current functional performance.  CLINICAL DECISION MAKING: LOW - limited treatment options, no task modification necessary  REHAB  POTENTIAL: Good  EVALUATION COMPLEXITY: Low      PLAN:  OT FREQUENCY: 1x week  OT DURATION: 5 weeks  PLANNED INTERVENTIONS: 97168 OT Re-evaluation, 97535 self care/ADL training, 02889 therapeutic exercise, 97530 therapeutic activity, 97112 neuromuscular re-education, 97140 manual therapy, 97113 aquatic therapy, 97035 ultrasound, 97018 paraffin, 02960 fluidotherapy, 97010 moist heat, 97010 cryotherapy, 97034 contrast bath,  97760 Orthotics management and training, 02239 Splinting (initial encounter), [phone] Subsequent splinting/medication, passive range of motion, energy conservation, coping strategies training, patient/family education, and DME and/or AE instructions  RECOMMENDED OTHER SERVICES: none  CONSULTED AND AGREED WITH PLAN OF CARE: Patient  PLAN FOR NEXT SESSION: check goals anticipate d/c   Jamesha Ellsworth, OT 11/29/2023, 4:29 PM                     OUTPATIENT OCCUPATIONAL THERAPY ORTHO Treatment  Patient Name: Veronica Galvan MRN: [id number] DOB:June 13, 1955, 68 y.o., female Today's Date: 11/29/2023  PCP: Leita Eliza Elbe [name] REFERRING PROVIDER: Leita Eliza Elbe [name]  END OF SESSION:  OT End of Session - 11/29/23 1613     Visit Number 32    Number of Visits 35    Date for OT Re-Evaluation 12/13/23    Authorization Type [employer] MCR    Authorization Time Period 12 weeks    Authorization - Visit Number 32    Progress Note Due on Visit 40    OT Start Time 1531    OT Stop Time 1615    OT Time Calculation (min) 44 min    Activity Tolerance Patient tolerated treatment well    Behavior During Therapy WFL for tasks assessed/performed                            Past Medical History:  Diagnosis Date   Allergy     Anxiety    Arthritis    hands   Cataract    Diabetes mellitus without complication (HCC)    type 2   Family history of adverse reaction to anesthesia    son has malignant hyperthermia, daughter does not daughter recently had c section without problems   GERD (gastroesophageal reflux disease)    Headache    sinus   Hyperlipidemia    Hypertension    PONV (postoperative nausea and vomiting)    nausea only   Ulcer, stomach peptic yrs ago   Past Surgical History:  Procedure Laterality Date   APPENDECTOMY     both hells bone spur repair     both heels with metal clips   both shoulder rotator cuff repair     CESAREAN SECTION     x 1    COLONOSCOPY WITH PROPOFOL  N/A 09/07/2016   Procedure: COLONOSCOPY WITH PROPOFOL ;  Surgeon: Vicci Gladis POUR, MD;  Location: [facility];  Service: Endoscopy;  Laterality: N/A;   colonscopy  06/2011   polyps   ELBOW FRACTURE SURGERY Left    EYE SURGERY     FRACTURE SURGERY     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/10/2022   Procedure: REVERSE SHOULDER ARTHROPLASTY;  Surgeon: Melita Drivers, MD;  Location: [facility];  Service: Orthopedics;  Laterality: Right;  Please follow in room 6 if able   TUBAL LIGATION     VESICO-VAGINAL FISTULA REPAIR     Patient Active Problem List   Diagnosis Date Noted   Gastroesophageal reflux disease 11/16/2021   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 08/18/2021   Dermatofibroma 08/18/2021  History of malignant neoplasm of skin 08/18/2021   Lentigo 08/18/2021   Melanocytic nevi of trunk 08/18/2021   Nevus lipomatosus cutaneous superficialis 08/18/2021   Rosacea 08/18/2021   Actinic keratosis 08/18/2021   Sensorineural hearing loss (SNHL) of left ear with unrestricted hearing of right ear 07/26/2021   Tinnitus of left ear 07/26/2021   Acute recurrent maxillary sinusitis 06/22/2021   Primary hypertension 01/12/2021   Hyperlipidemia associated with type 2 diabetes mellitus (HCC) 01/12/2021   Arthritis 01/12/2021   Type II diabetes mellitus (HCC) 11/25/2019   Ingrown toenail 09/05/2018   Neck pain 01/29/2018   Tick bite 10/24/2017    ONSET DATE: 05/19/23  REFERRING DIAG:  Diagnosis  M79.641,M79.642 (ICD-10-CM) - Bilateral hand pain    THERAPY DIAG:  Muscle weakness (generalized)  Stiffness of right shoulder, not elsewhere classified  Stiffness of left hand, not elsewhere classified  Stiffness of right hand, not elsewhere classified  Pain in right hand  Pain in left hand  Other lack of coordination  Rationale for Evaluation and Treatment: Rehabilitation  SUBJECTIVE:   SUBJECTIVE STATEMENT: Pt reports   her hands felt looser after last OT session Pt accompanied by: self  PERTINENT HISTORY: 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 Yes: NPRS scale: 6/10- right hand Pain location: right hand,  5/10 left hand Pain description: aching, stiff Aggravating factors: gripping  Relieving factors: heat, meds Pt also has shoulder pain which is more significant grossly R shoulder 8/10 L shoulder pain 3/10 which OT will not address. PT is addressing shoulder FALLS: Has patient fallen in last 6 months? No  LIVING ENVIRONMENT: Lives with: lives alone Lives in: House/apartment Stairs: yes   PLOF: Independent  PATIENT GOALS: improve functional use of hands and learn adapted strategies for ADLs/IADLs.  NEXT MD VISIT: unknown  OBJECTIVE:  Note: Objective measures were completed at Evaluation unless otherwise noted.  HAND DOMINANCE: Right  ADLs: Overall ADLs: unable to grip credit card to remove from ATM Transfers/ambulation related to ADLs: mod I Eating: drops silverware Grooming: drops toothbrush, uses electric toothbrush, holding comb Upper body dressing: adjusting bra Lower body dressing: difficulty pulling up pants  Toileting: hygeine was difficult initally Bathing: mod I, difficult with shaving Tub shower transfers: walk in shower    FUNCTIONAL OUTCOME MEASURES: Quick Dash: 05/30/23- 75%% disability Quick Dash 08/15/23- 70.5% disability   UPPER EXTREMITY ROM:   RUE wrist flexion/ extension: 70/ 50, LUE wrist flexion/ extension: 70/ 45 Pt demonstrates grossly 50% composite flexion for right and 555 with left.   Active ROM Right eval Left eval  Thumb MCP (0-60) 45 55  Thumb IP (0-80) 25 10  Thumb Radial abd/add (0-55)     Thumb Palmar abd/add  (0-45)     Thumb Opposition to Small Finger yes yes   Index MCP (0-90)     Index PIP (0-100)     Index DIP (0-70)      Long MCP (0-90)      Long PIP (0-100)      Long DIP (0-70)      Ring MCP (0-90)      Ring PIP (0-100)      Ring DIP (0-70)      Little MCP (0-90)      Little PIP (0-100)  Little DIP (0-70)      (Blank rows = not tested) 9 hole peg test (08/15/23) RUE 26.70 secs, LUE 23 secs  HAND FUNCTION: Grip strength: Right: 8 lbs; Left: 4 lbs,          08/15/23-  RUE 18, LUE 14 lbs,           08/15/23   Pinch: RUE tip 4lbs, lateral 8 lbs, LUE tip 3 lbs, lateral 8 lbs   SENSATION: Light touch: Impaired index finger LUE  EDEMA: Pt with bony defomities at PIP joints of all digits which pt reports are bone spurs, Pt also has bony deformity at DIP for right thumb, index and 5th digit and LUE small and index fingers  COGNITION: Overall cognitive status: Within functional limits for tasks assessed   OBSERVATIONS: Pleasant female desires to increase functional use of bilateral UE's  , TREATMENT DATE:10/31/23-Paraffin to RUE x 8 mins, no adverse reactions, while therapist performed US  to left hand 3.3 mhz, 0.8 w/cm 2,  20%x 8 mins no adverse reactions for stiffness. Paraffin to LUE x 8 mins, no adverse reactions, while therapist performed US  to right hand 3.3 mhz, 0.8 w/cm 2,  20%x 8 mins no adverse reactions for stiffness.  Pt reports increased pain and stiffness after using her hands alot to prepare meals for [employer] last week. Pt was encouraged to rest her hands and use paraffin at home if possible. Therapist recommends avoiding use of putty for several days o allow pain to resolve. Discussed plans to renew next visit and to continue for another month. Gentle soft tissue and joint mobs/ distraction to digits, hand, wrist forearms bilaterally  Passive composite finger flexion to right hand and then individual finger flexion to left hand .  10/24/23-Paraffin to RUE x 10 mins, no  adverse reactions, while therapist performed US  to left hand 3.3 mhz, 0.8 w/cm 2,  20%x 8 mins no adverse reactions for stiffness. Paraffin to LUE x 10 mins, no adverse reactions, while therapist performed US  to right hand 3.3 mhz, 0.8 w/cm 2,  20%x 8 mins no adverse reactions for stiffness.  Gentle soft tissue and joint mobs/ distraction to digits, hand, wrist bilaterally as welll as right forearm, pt reports  hands and wrists are feeling looser at end of session. Passive composite finger flexion to right hand and then individual finger flexion to left hand . Therapist checked grip strength:  RUE 23 lbs LUE 25 lbs, pt demonstrates bilateral improvements and she reports continuing improving functional use.   10/03/23 Paraffin to RUE x 10 mins, no adverse reactions, while therapist performed US  to left hand 3.3 mhz, 0.8 w/cm 2,  20%x 8 mins no adverse reactions for stiffness. Paraffin to LUE x 10 mins, no adverse reactions, while therapist performed US  to right hand 3.3 mhz, 0.8 w/cm 2,  20%x 8 mins no adverse reactions for stiffness.  Passive composite finger flexion to right hand and then individual finger flexion to left hand . Gentle soft tissue and joint mobs/ distraction to digits, hand, wrist bilaterally as welll as right foream, pt reports feeling looser Resistive gripping with  tree squeeze ball left and right UE's digi flex, light resistance for composite and individual finger flexion for LUE   PATIENT EDUCATION: Education details:   see above Person educated: Patient Education method: Explanation, demonstration, v.c Education comprehension: verbalized understanding, returned demonstration  HOME EXERCISE PROGRAM: AROM yellow putty  GOALS: Goals reviewed with patient? Yes  SHORT TERM GOALS: Target date:  09/14/23  I with HEP  Goal status:  met 06/07/23  2.  I with positioning/ splinting prn to minimize pain and defomity   Goal status: met, 07/13/23  3.  Pt will increase  bilateral grip strength by 5 lbs for increased UE functional use. Upgraded goal Pt will increase bilateral grip strength to at least 25 lbs (after stretching). Baseline: 05/30/23- RUE 8 lbs, LUE 4 lbs Goal status:met 08/01/23-R 20 lbs, L  19 lbs,  08/15/23- RUE 18 lbsLUE 14 lbs continue to address upgraded goal  - RUE 19 lbs, LUE 24- 10/03/23  RUE 23 lbs LUE 25 lbs, met for LUE, ongoing for right. 10/17/23  4. Pt will demonstrate improved RUE fine motor coordination as evidenced by perfroming 9 hole peg test in 23 secs or less.  Goal status: ongoing 10/17/23 5. I with updates to HEP.  Goal status: ongoing 10/17/23 6. Pt will demonstrate improved composite finger flexion for ADLs as evidenced by pt. bringing middle fingertip for RUE within 1/2 an inch of palm.  Baseline: 3/4 inch from palm to middle fingertip  Goal status : ongoing, 3/4 inch 09/19/23 7.Pt will demonstrate improved composite finger flexion for ADLs as evidenced by pt. bringing middle fingertip for LUE within 3/4 an inch of palm.  Baseline: 1 inch from palm to middle finger tip  Goal status: met, 3/4 inch away from middle finger 09/19/23  LONG TERM GOALS: Target date:11/07/23  I with updated HEP  Goal status:met 08/15/22  2.  Pt will improve Quick Dash score to 70% disability Baseline: 75% (05/30/23) Goal status:   improved - however not fully met due to complications from shoulder pain-70.5%08/15/23  3.  Pt will demonstrate at least 65% composte flexion for LUE for increased functional use.  Goal status: met 65-70% , 08/15/23  4.  I with adapted strategies/ adapted equipment to minimize pain and to increase pt I with ADLs/IADLs  Goal status:  met for inital strategies, however continue goal as pt may report additional tasks that can benefit from modification.ongoing  10/31/23  5.  Pt will demonstrate at least 65% composite flexion for RUE for increased functional use.  Goal status: met 70% 08/15/23  6. Pt will report that she is  able to cut her meat such as steak or chicken modified independently.  [place]: unable  Goal status: met 09/21/23    7. Pt will report increased ease with opening/ closing ziplock bags.    Goal status: met 09/21/23    8. Pt will improve bilateral tip pinch by 2 lbs for increased ease with daily activities.     Goal status: ongoing 10/31/23    9. Pt will improve bilateral lateral pinch by 2 lbs for increased ease with ADLs.    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   IMPAIRMENTS: are limiting patient from ADLs, IADLs, rest and sleep, education, play, leisure, and social participation.   COMORBIDITIES: may have co-morbidities  that affects occupational performance. Patient will benefit from skilled OT to address above impairments and improve overall function.  MODIFICATION OR ASSISTANCE TO COMPLETE  EVALUATION: No modification of tasks or assist necessary to complete an evaluation.  OT OCCUPATIONAL PROFILE AND HISTORY: Detailed assessment: Review of records and additional review of physical, cognitive, psychosocial history related to current functional performance.  CLINICAL DECISION MAKING: LOW - limited treatment options, no task modification necessary  REHAB POTENTIAL: Good  EVALUATION COMPLEXITY: Low      PLAN:  OT FREQUENCY: 2x/week  OT DURATION: 12 weeks( anticipate d/c after 8 weeks dependent on progress)  PLANNED INTERVENTIONS: 02831  OT Re-evaluation, 97535 self care/ADL training, 02889 therapeutic exercise, 97530 therapeutic activity, 97112 neuromuscular re-education, 97140 manual therapy, 97113 aquatic therapy, 97035 ultrasound, 97018 paraffin, 02960 fluidotherapy, 97010 moist heat, 97010 cryotherapy, 97034 contrast bath, 97760 Orthotics management and training, 02239 Splinting (initial encounter), [phone] Subsequent splinting/medication, passive range of motion, energy conservation, coping strategies training, patient/family education, and DME and/or AE instructions  RECOMMENDED OTHER SERVICES: none  CONSULTED AND AGREED WITH PLAN OF CARE: Patient  PLAN FOR NEXT SESSION:  check  goals, renew, functional use and ROM for bilateral hands   Deavion Strider, OT 11/29/2023, 4:29 PM                     OUTPATIENT OCCUPATIONAL THERAPY ORTHO Treatment  Patient Name: Veronica Galvan MRN: [id number] DOB:December 11, 1955, 68 y.o., female Today's Date: 11/29/2023  PCP: Leita Eliza Elbe [name] REFERRING PROVIDER: Leita Eliza Elbe [name]  END OF SESSION:  OT End of Session - 11/29/23 1613     Visit Number 32    Number of Visits 35    Date for OT Re-Evaluation 12/13/23    Authorization Type [employer] MCR    Authorization Time Period 12 weeks    Authorization - Visit Number 32    Progress Note Due on Visit 40    OT Start Time 1531    OT Stop Time 1615    OT Time Calculation (min) 44 min    Activity Tolerance Patient tolerated treatment well    Behavior During Therapy WFL for tasks assessed/performed                            Past Medical History:  Diagnosis Date   Allergy     Anxiety    Arthritis    hands   Cataract    Diabetes mellitus without complication (HCC)    type 2   Family history of adverse reaction to anesthesia    son has malignant hyperthermia, daughter does not daughter recently had c section without problems   GERD (gastroesophageal reflux disease)    Headache    sinus    Hyperlipidemia    Hypertension    PONV (postoperative nausea and vomiting)    nausea only   Ulcer, stomach peptic yrs ago   Past Surgical History:  Procedure Laterality Date   APPENDECTOMY     both hells bone spur repair     both heels with metal clips   both shoulder rotator cuff repair     CESAREAN SECTION     x 1   COLONOSCOPY WITH PROPOFOL  N/A 09/07/2016   Procedure: COLONOSCOPY WITH PROPOFOL ;  Surgeon: Vicci Gladis POUR, MD;  Location: [facility];  Service: Endoscopy;  Laterality: N/A;   colonscopy  06/2011   polyps   ELBOW FRACTURE SURGERY Left    EYE SURGERY     FRACTURE SURGERY     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/10/2022   Procedure: REVERSE SHOULDER ARTHROPLASTY;  Surgeon: Melita Drivers,  MD;  Location: [facility];  Service: Orthopedics;  Laterality: Right;  Please follow in room 6 if able   TUBAL LIGATION     VESICO-VAGINAL FISTULA REPAIR     Patient Active Problem List   Diagnosis Date Noted   Gastroesophageal reflux disease 11/16/2021   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 08/18/2021   Dermatofibroma 08/18/2021   History of malignant neoplasm of skin 08/18/2021   Lentigo 08/18/2021   Melanocytic nevi of trunk 08/18/2021   Nevus lipomatosus cutaneous superficialis 08/18/2021   Rosacea 08/18/2021   Actinic keratosis 08/18/2021   Sensorineural hearing loss (SNHL) of left ear with unrestricted hearing of right ear 07/26/2021   Tinnitus of left ear 07/26/2021   Acute recurrent maxillary sinusitis 06/22/2021   Primary hypertension 01/12/2021   Hyperlipidemia associated with type 2 diabetes mellitus (HCC) 01/12/2021   Arthritis 01/12/2021   Type II diabetes mellitus (HCC) 11/25/2019   Ingrown toenail 09/05/2018   Neck pain 01/29/2018   Tick bite 10/24/2017    ONSET DATE: 05/19/23  REFERRING DIAG:  Diagnosis  M79.641,M79.642 (ICD-10-CM) - Bilateral hand pain    THERAPY DIAG:  Muscle weakness (generalized)  Stiffness of  right shoulder, not elsewhere classified  Stiffness of left hand, not elsewhere classified  Stiffness of right hand, not elsewhere classified  Pain in right hand  Pain in left hand  Other lack of coordination  Rationale for Evaluation and Treatment: Rehabilitation  SUBJECTIVE:   SUBJECTIVE STATEMENT: Pt reports  her hands felt looser after last OT session Pt accompanied by: self  PERTINENT HISTORY: 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 Yes: NPRS scale: 4/10 Pain location: right hand,  left hand Pain description: aching, stiff Aggravating factors: gripping  Relieving factors: heat, meds Pt also has shoulder pain which is more significant grossly R shoulder 7/10 L shoulder pain 3/10 which OT will not address. PT is addressing shoulder FALLS: Has patient fallen in last 6 months? No  LIVING ENVIRONMENT: Lives with: lives alone Lives in: House/apartment Stairs: yes   PLOF: Independent  PATIENT GOALS: improve functional use of hands and learn adapted strategies for ADLs/IADLs.  NEXT MD VISIT: unknown  OBJECTIVE:  Note: Objective measures were completed at Evaluation unless otherwise noted.  HAND DOMINANCE: Right  ADLs: Overall ADLs: unable to grip credit card to remove from ATM Transfers/ambulation related to ADLs: mod I Eating: drops silverware Grooming: drops toothbrush, uses electric toothbrush, holding comb Upper body dressing: adjusting bra Lower body dressing: difficulty pulling up pants  Toileting: hygeine was difficult initally Bathing: mod I, difficult with shaving Tub shower transfers: walk in shower    FUNCTIONAL OUTCOME MEASURES: Quick Dash: 05/30/23- 75%% disability Quick Dash 08/15/23- 70.5% disability   UPPER  EXTREMITY ROM:   RUE wrist flexion/ extension: 70/ 50, LUE wrist flexion/ extension: 70/ 45 Pt demonstrates grossly 50% composite flexion for right and 555 with left.   Active ROM Right eval Left eval  Thumb MCP (0-60) 45 55  Thumb IP (0-80) 25 10  Thumb Radial abd/add (0-55)     Thumb Palmar abd/add (0-45)     Thumb Opposition to Small Finger yes yes   Index MCP (0-90)     Index PIP (0-100)     Index DIP (0-70)  Long MCP (0-90)      Long PIP (0-100)      Long DIP (0-70)      Ring MCP (0-90)      Ring PIP (0-100)      Ring DIP (0-70)      Little MCP (0-90)      Little PIP (0-100)      Little DIP (0-70)      (Blank rows = not tested) 9 hole peg test (08/15/23) RUE 26.70 secs, LUE 23 secs  HAND FUNCTION: Grip strength: Right: 8 lbs; Left: 4 lbs,          08/15/23-  RUE 18, LUE 14 lbs,           08/15/23   Pinch: RUE tip 4lbs, lateral 8 lbs, LUE tip 3 lbs, lateral 8 lbs   SENSATION: Light touch: Impaired index finger LUE  EDEMA: Pt with bony defomities at PIP joints of all digits which pt reports are bone spurs, Pt also has bony deformity at DIP for right thumb, index and 5th digit and LUE small and index fingers  COGNITION: Overall cognitive status: Within functional limits for tasks assessed   OBSERVATIONS: Pleasant female desires to increase functional use of bilateral UE's  , TREATMENT DATE:10/24/23-Paraffin to RUE x 10 mins, no adverse reactions, while therapist performed US  to left hand 3.3 mhz, 0.8 w/cm 2,  20%x 8 mins no adverse reactions for stiffness. Paraffin to LUE x 10 mins, no adverse reactions, while therapist performed US  to right hand 3.3 mhz, 0.8 w/cm 2,  20%x 8 mins no adverse reactions for stiffness.  Gentle soft tissue and joint mobs/ distraction to digits, hand, wrist bilaterally as welll as right forearm,  Passive finger flexion to bilateral hands. Therpist checked 9 hole peg test, see goals. 10/17/23-Paraffin to RUE x 10 mins, no adverse  reactions, while therapist performed US  to left hand 3.3 mhz, 0.8 w/cm 2,  20%x 8 mins no adverse reactions for stiffness. Paraffin to LUE x 10 mins, no adverse reactions, while therapist performed US  to right hand 3.3 mhz, 0.8 w/cm 2,  20%x 8 mins no adverse reactions for stiffness.  Gentle soft tissue and joint mobs/ distraction to digits, hand, wrist bilaterally as welll as right forearm, pt reports  hands and wrists are feeling looser at end of session. Passive composite finger flexion to right hand and then individual finger flexion to left hand . Therapist checked grip strength:  RUE 23 lbs LUE 25 lbs, pt demonstrates bilateral improvements and she reports continuing improving functional use.   10/03/23 Paraffin to RUE x 10 mins, no adverse reactions, while therapist performed US  to left hand 3.3 mhz, 0.8 w/cm 2,  20%x 8 mins no adverse reactions for stiffness. Paraffin to LUE x 10 mins, no adverse reactions, while therapist performed US  to right hand 3.3 mhz, 0.8 w/cm 2,  20%x 8 mins no adverse reactions for stiffness.  Passive composite finger flexion to right hand and then individual finger flexion to left hand . Gentle soft tissue and joint mobs/ distraction to digits, hand, wrist bilaterally as welll as right foream, pt reports feeling looser Resistive gripping with  tree squeeze ball left and right UE's digi flex, light resistance for composite and individual finger flexion for LUE   PATIENT EDUCATION: Education details:   see above Person educated: Patient Education method: Explanation, demonstration, v.c Education comprehension: verbalized understanding, returned demonstration  HOME EXERCISE PROGRAM: AROM yellow putty  GOALS: Goals reviewed with patient?  Yes  SHORT TERM GOALS: Target date: 09/14/23  I with HEP  Goal status:  met 06/07/23  2.  I with positioning/ splinting prn to minimize pain and defomity   Goal status: met, 07/13/23  3.  Pt will increase bilateral grip  strength by 5 lbs for increased UE functional use. Upgraded goal Pt will increase bilateral grip strength to at least 25 lbs (after stretching). Baseline: 05/30/23- RUE 8 lbs, LUE 4 lbs Goal status:met 08/01/23-R 20 lbs, L  19 lbs,  08/15/23- RUE 18 lbsLUE 14 lbs continue to address upgraded goal  - RUE 19 lbs, LUE 24- 10/03/23  RUE 23 lbs LUE 25 lbs, met for LUE, ongoing for right. 10/17/23  4. Pt will demonstrate improved RUE fine motor coordination as evidenced by perfroming 9 hole peg test in 23 secs or less.  Goal status: ongoing 10/17/23, ongoing 26.68- 10/24/23 5. I with updates to HEP.  Goal status: ongoing 10/17/23 6. Pt will demonstrate improved composite finger flexion for ADLs as evidenced by pt. bringing middle fingertip for RUE within 1/2 an inch of palm.  Baseline: 3/4 inch from palm to middle fingertip  Goal status : ongoing, 3/4 inch 09/19/23, 1 inch from palm 10/24/23 7.Pt will demonstrate improved composite finger flexion for ADLs as evidenced by pt. bringing middle fingertip for LUE within 3/4 an inch of palm.  Baseline: 1 inch from palm to middle finger tip  Goal status: met, 3/4 inch away from middle finger 09/19/23  LONG TERM GOALS: Target date:11/07/23  I with updated HEP  Goal status:met 08/15/22  2.  Pt will improve Quick Dash score to 70% disability Baseline: 75% (05/30/23) Goal status:   improved - however not fully met due to complications from shoulder pain-70.5%08/15/23  3.  Pt will demonstrate at least 65% composte flexion for LUE for increased functional use.  Goal status: met 65-70% , 08/15/23  4.  I with adapted strategies/ adapted equipment to minimize pain and to increase pt I with ADLs/IADLs  Goal status:  met for inital strategies, however continue goal as pt may report additional tasks that can benefit from modification.ongoing 10/03/23  5.  Pt will demonstrate at least 65% composite flexion for RUE for increased functional use.  Goal status: met 70%  08/15/23  6. Pt will report that she is able to cut her meat such as steak or chicken modified independently.  [place]: unable  Goal status: met 09/21/23    7. Pt will report increased ease with opening/ closing ziplock bags.    Goal status: met 09/21/23    8. Pt will improve bilateral tip pinch by 2 lbs for increased ease with daily activities.     Goal status: ongoing 09/19/23    9. Pt will improve bilateral lateral pinch by 2 lbs for increased ease with ADLs.    Goal status: ongoing 09/19/23   ASSESSMENT: CLINICAL IMPRESSION: Pt reports improved ability to use hands functionally for meal prep during [employer]. Today hand pain is 4/10 which is improved overall. PERFORMANCE DEFICITS: in functional skills including ADLs, IADLs, coordination, sensation, edema, ROM, strength, pain, flexibility, Fine motor control, Gross motor control, endurance, decreased knowledge of precautions, decreased knowledge of use of DME, and UE functional use,, and psychosocial skills including coping strategies, environmental adaptation, habits, interpersonal interactions, and routines and behaviors.   IMPAIRMENTS: are limiting patient from ADLs, IADLs, rest and sleep, education, play, leisure, and social participation.   COMORBIDITIES: may have co-morbidities  that affects occupational performance. Patient  will benefit from skilled OT to address above impairments and improve overall function.  MODIFICATION OR ASSISTANCE TO COMPLETE EVALUATION: No modification of tasks or assist necessary to complete an evaluation.  OT OCCUPATIONAL PROFILE AND HISTORY: Detailed assessment: Review of records and additional review of physical, cognitive, psychosocial history related to current functional performance.  CLINICAL DECISION MAKING: LOW - limited treatment options, no task modification necessary  REHAB POTENTIAL: Good  EVALUATION COMPLEXITY: Low      PLAN:  OT FREQUENCY: 2x/week  OT DURATION: 12 weeks( anticipate d/c  after 8 weeks dependent on progress)  PLANNED INTERVENTIONS: 02831 OT Re-evaluation, 97535 self care/ADL training, 02889 therapeutic exercise, 97530 therapeutic activity, 97112 neuromuscular re-education, 97140 manual therapy, 97113 aquatic therapy, 97035 ultrasound, 97018 paraffin, 02960 fluidotherapy, 97010 moist heat, 97010 cryotherapy, 97034 contrast bath, 97760 Orthotics management and training, 02239 Splinting (initial encounter), [phone] Subsequent splinting/medication, passive range of motion, energy conservation, coping strategies training, patient/family education, and DME and/or AE instructions  RECOMMENDED OTHER SERVICES: none  CONSULTED AND AGREED WITH PLAN OF CARE: Patient  PLAN FOR NEXT SESSION:   functional use and ROM for bilateral hands   Denaly Gatling, OT 11/29/2023, 4:29 PM                     OUTPATIENT OCCUPATIONAL THERAPY ORTHO Treatment  Patient Name: Veronica Galvan MRN: [id number] DOB:06/10/1955, 68 y.o., female Today's Date: 11/29/2023  PCP: Leita Eliza Elbe [name] REFERRING PROVIDER: Leita Eliza Elbe [name]  END OF SESSION:  OT End of Session - 11/29/23 1613     Visit Number 32    Number of Visits 35    Date for OT Re-Evaluation 12/13/23    Authorization Type [employer] MCR    Authorization Time Period 12 weeks    Authorization - Visit Number 32    Progress Note Due on Visit 40    OT Start Time 1531    OT Stop Time 1615    OT Time Calculation (min) 44 min    Activity Tolerance Patient tolerated treatment well    Behavior During Therapy WFL for tasks assessed/performed                            Past Medical History:  Diagnosis Date   Allergy     Anxiety    Arthritis    hands   Cataract    Diabetes mellitus without complication (HCC)    type 2   Family history of adverse reaction to anesthesia    son has malignant hyperthermia, daughter does not daughter recently had c section without problems   GERD  (gastroesophageal reflux disease)    Headache    sinus   Hyperlipidemia    Hypertension    PONV (postoperative nausea and vomiting)    nausea only   Ulcer, stomach peptic yrs ago   Past Surgical History:  Procedure Laterality Date   APPENDECTOMY     both hells bone spur repair     both heels with metal clips   both shoulder rotator cuff repair     CESAREAN SECTION     x 1   COLONOSCOPY WITH PROPOFOL  N/A 09/07/2016   Procedure: COLONOSCOPY WITH PROPOFOL ;  Surgeon: Vicci Gladis POUR, MD;  Location: [facility];  Service: Endoscopy;  Laterality: N/A;   colonscopy  06/2011   polyps   ELBOW FRACTURE SURGERY Left    EYE SURGERY     FRACTURE SURGERY  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/10/2022   Procedure: REVERSE SHOULDER ARTHROPLASTY;  Surgeon: Melita Drivers, MD;  Location: [facility];  Service: Orthopedics;  Laterality: Right;  Please follow in room 6 if able   TUBAL LIGATION     VESICO-VAGINAL FISTULA REPAIR     Patient Active Problem List   Diagnosis Date Noted   Gastroesophageal reflux disease 11/16/2021   Asymptomatic varicose veins of bilateral lower extremities 08/18/2021   Dermatofibroma 08/18/2021   History of malignant neoplasm of skin 08/18/2021   Lentigo 08/18/2021   Melanocytic nevi of trunk 08/18/2021   Nevus lipomatosus cutaneous superficialis 08/18/2021   Rosacea 08/18/2021   Actinic keratosis 08/18/2021   Sensorineural hearing loss (SNHL) of left ear with unrestricted hearing of right ear 07/26/2021   Tinnitus of left ear 07/26/2021   Acute recurrent maxillary sinusitis 06/22/2021   Primary hypertension 01/12/2021   Hyperlipidemia associated with type 2 diabetes mellitus (HCC) 01/12/2021   Arthritis 01/12/2021   Type II diabetes mellitus (HCC) 11/25/2019   Ingrown toenail 09/05/2018   Neck pain 01/29/2018   Tick bite 10/24/2017    ONSET DATE: 05/19/23  REFERRING DIAG:  Diagnosis  M79.641,M79.642 (ICD-10-CM) - Bilateral hand pain     THERAPY DIAG:  Muscle weakness (generalized)  Stiffness of right shoulder, not elsewhere classified  Stiffness of left hand, not elsewhere classified  Stiffness of right hand, not elsewhere classified  Pain in right hand  Pain in left hand  Other lack of coordination  Rationale for Evaluation and Treatment: Rehabilitation  SUBJECTIVE:   SUBJECTIVE STATEMENT: Pt reports  her hands felt looser after last OT session Pt accompanied by: self  PERTINENT HISTORY: 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 Yes: NPRS scale: 7/10- right hand Pain location: right hand,  3/10 left hand Pain description: aching, stiff Aggravating factors: gripping  Relieving factors: heat, meds Pt also has shoulder pain which is more significant grossly R shoulder 8/10 L shoulder pain 3/10 which OT will not address. PT is addressing shoulder FALLS: Has patient fallen in last 6 months? No  LIVING ENVIRONMENT: Lives with: lives alone Lives in: House/apartment Stairs: yes   PLOF: Independent  PATIENT GOALS: improve functional use of hands and learn adapted strategies for ADLs/IADLs.  NEXT MD VISIT: unknown  OBJECTIVE:  Note: Objective measures were completed at Evaluation unless otherwise noted.  HAND DOMINANCE: Right  ADLs: Overall ADLs: unable to grip credit card to remove from ATM Transfers/ambulation related to ADLs: mod I Eating: drops silverware Grooming: drops toothbrush, uses electric toothbrush, holding comb Upper body dressing: adjusting bra Lower body dressing: difficulty pulling up pants  Toileting: hygeine was difficult initally Bathing: mod I, difficult with shaving Tub shower transfers: walk in shower    FUNCTIONAL OUTCOME  MEASURES: Quick Dash: 05/30/23- 75%% disability Quick Dash 08/15/23- 70.5% disability   UPPER EXTREMITY ROM:   RUE wrist flexion/ extension: 70/ 50, LUE wrist flexion/ extension: 70/ 45 Pt demonstrates grossly 50% composite flexion for right and 555 with left.   Active ROM Right eval Left eval  Thumb MCP (0-60) 45 55  Thumb IP (0-80) 25 10  Thumb Radial abd/add (0-55)     Thumb Palmar abd/add (0-45)     Thumb Opposition to Small Finger yes yes   Index  MCP (0-90)     Index PIP (0-100)     Index DIP (0-70)      Long MCP (0-90)      Long PIP (0-100)      Long DIP (0-70)      Ring MCP (0-90)      Ring PIP (0-100)      Ring DIP (0-70)      Little MCP (0-90)      Little PIP (0-100)      Little DIP (0-70)      (Blank rows = not tested) 9 hole peg test (08/15/23) RUE 26.70 secs, LUE 23 secs  HAND FUNCTION: Grip strength: Right: 8 lbs; Left: 4 lbs,          08/15/23-  RUE 18, LUE 14 lbs,           08/15/23   Pinch: RUE tip 4lbs, lateral 8 lbs, LUE tip 3 lbs, lateral 8 lbs   SENSATION: Light touch: Impaired index finger LUE  EDEMA: Pt with bony defomities at PIP joints of all digits which pt reports are bone spurs, Pt also has bony deformity at DIP for right thumb, index and 5th digit and LUE small and index fingers  COGNITION: Overall cognitive status: Within functional limits for tasks assessed   OBSERVATIONS: Pleasant female desires to increase functional use of bilateral UE's  , TREATMENT DATE:Paraffin to RUE x 10 mins, no adverse reactions, while therapist performed US  to left hand 3.3 mhz, 0.8 w/cm 2,  20%x 8 mins no adverse reactions for stiffness. Paraffin to LUE x 10 mins, no adverse reactions, while therapist performed US  to right hand 3.3 mhz, 0.8 w/cm 2,  20%x 8 mins no adverse reactions for stiffness.  Gentle soft tissue and joint mobs/ distraction to digits, hand, wrist bilaterally as welll as right forearm, pt reports  hands and wrists are feeling looser at end of  session. Passive composite finger flexion to right hand and then individual finger flexion to left hand . Therapist checked grip strength:  RUE 23 lbs LUE 25 lbs, pt demonstrates bilateral improvements and she reports continuing improving functional use.   10/03/23 Paraffin to RUE x 10 mins, no adverse reactions, while therapist performed US  to left hand 3.3 mhz, 0.8 w/cm 2,  20%x 8 mins no adverse reactions for stiffness. Paraffin to LUE x 10 mins, no adverse reactions, while therapist performed US  to right hand 3.3 mhz, 0.8 w/cm 2,  20%x 8 mins no adverse reactions for stiffness.  Passive composite finger flexion to right hand and then individual finger flexion to left hand . Gentle soft tissue and joint mobs/ distraction to digits, hand, wrist bilaterally as welll as right foream, pt reports feeling looser Resistive gripping with  tree squeeze ball left and right UE's digi flex, light resistance for composite and individual finger flexion for LUE   PATIENT EDUCATION: Education details:   see above Person educated: Patient Education method: Explanation, demonstration, v.c Education comprehension: verbalized understanding, returned demonstration  HOME EXERCISE PROGRAM: AROM yellow putty  GOALS: Goals reviewed with patient? Yes  SHORT TERM GOALS: Target date: 09/14/23  I with HEP  Goal status:  met 06/07/23  2.  I with positioning/ splinting prn to minimize pain and defomity   Goal status: met, 07/13/23  3.  Pt will increase bilateral grip strength by 5 lbs for increased UE functional use. Upgraded goal Pt will increase bilateral grip strength to at least 25 lbs (after stretching). Baseline: 05/30/23- RUE  8 lbs, LUE 4 lbs Goal status:met 08/01/23-R 20 lbs, L  19 lbs,  08/15/23- RUE 18 lbsLUE 14 lbs continue to address upgraded goal  - RUE 19 lbs, LUE 24- 10/03/23  RUE 23 lbs LUE 25 lbs, met for LUE, ongoing for right. 10/17/23  4. Pt will demonstrate improved RUE fine motor coordination  as evidenced by perfroming 9 hole peg test in 23 secs or less.  Goal status: ongoing 10/17/23 5. I with updates to HEP.  Goal status: ongoing 10/17/23 6. Pt will demonstrate improved composite finger flexion for ADLs as evidenced by pt. bringing middle fingertip for RUE within 1/2 an inch of palm.  Baseline: 3/4 inch from palm to middle fingertip  Goal status : ongoing, 3/4 inch 09/19/23 7.Pt will demonstrate improved composite finger flexion for ADLs as evidenced by pt. bringing middle fingertip for LUE within 3/4 an inch of palm.  Baseline: 1 inch from palm to middle finger tip  Goal status: met, 3/4 inch away from middle finger 09/19/23  LONG TERM GOALS: Target date:11/07/23  I with updated HEP  Goal status:met 08/15/22  2.  Pt will improve Quick Dash score to 70% disability Baseline: 75% (05/30/23) Goal status:   improved - however not fully met due to complications from shoulder pain-70.5%08/15/23  3.  Pt will demonstrate at least 65% composte flexion for LUE for increased functional use.  Goal status: met 65-70% , 08/15/23  4.  I with adapted strategies/ adapted equipment to minimize pain and to increase pt I with ADLs/IADLs  Goal status:  met for inital strategies, however continue goal as pt may report additional tasks that can benefit from modification.ongoing 10/03/23  5.  Pt will demonstrate at least 65% composite flexion for RUE for increased functional use.  Goal status: met 70% 08/15/23  6. Pt will report that she is able to cut her meat such as steak or chicken modified independently.  [place]: unable  Goal status: met 09/21/23    7. Pt will report increased ease with opening/ closing ziplock bags.    Goal status: met 09/21/23    8. Pt will improve bilateral tip pinch by 2 lbs for increased ease with daily activities.     Goal status: ongoing 09/19/23    9. Pt will improve bilateral lateral pinch by 2 lbs for increased ease with ADLs.    Goal status: ongoing  09/19/23   ASSESSMENT: CLINICAL IMPRESSION: Pt demonstrates improving flexibility and grip strength.PERFORMANCE DEFICITS: in functional skills including ADLs, IADLs, coordination, sensation, edema, ROM, strength, pain, flexibility, Fine motor control, Gross motor control, endurance, decreased knowledge of precautions, decreased knowledge of use of DME, and UE functional use,, and psychosocial skills including coping strategies, environmental adaptation, habits, interpersonal interactions, and routines and behaviors.   IMPAIRMENTS: are limiting patient from ADLs, IADLs, rest and sleep, education, play, leisure, and social participation.   COMORBIDITIES: may have co-morbidities  that affects occupational performance. Patient will benefit from skilled OT to address above impairments and improve overall function.  MODIFICATION OR ASSISTANCE TO COMPLETE EVALUATION: No modification of tasks or assist necessary to complete an evaluation.  OT OCCUPATIONAL PROFILE AND HISTORY: Detailed assessment: Review of records and additional review of physical, cognitive, psychosocial history related to current functional performance.  CLINICAL DECISION MAKING: LOW - limited treatment options, no task modification necessary  REHAB POTENTIAL: Good  EVALUATION COMPLEXITY: Low      PLAN:  OT FREQUENCY: 2x/week  OT DURATION: 12 weeks( anticipate d/c after 8 weeks  dependent on progress)  PLANNED INTERVENTIONS: 97168 OT Re-evaluation, 97535 self care/ADL training, 02889 therapeutic exercise, 97530 therapeutic activity, 97112 neuromuscular re-education, 97140 manual therapy, [id number] aquatic therapy, 97035 ultrasound, 97018 paraffin, 02960 fluidotherapy, 97010 moist heat, 97010 cryotherapy, 97034 contrast bath, 97760 Orthotics management and training, 02239 Splinting (initial encounter), [id number] Subsequent splinting/medication, passive range of motion, energy conservation, coping strategies training, patient/family  education, and DME and/or AE instructions  RECOMMENDED OTHER SERVICES: none  CONSULTED AND AGREED WITH PLAN OF CARE: Patient  PLAN FOR NEXT SESSION:   check progress towards goals and and plan to renew.   Raja Liska, OT 11/29/2023, 4:29 PM                     OUTPATIENT OCCUPATIONAL THERAPY ORTHO Treatment  Patient Name: Veronica Galvan MRN: [id number] DOB:06-26-1955, 68 y.o., female Today's Date: 11/29/2023  PCP: Leita Eliza Elbe [name] REFERRING PROVIDER: Leita Eliza Elbe [name]  END OF SESSION:  OT End of Session - 11/29/23 1613     Visit Number 32    Number of Visits 35    Date for OT Re-Evaluation 12/13/23    Authorization Type [employer] MCR    Authorization Time Period 12 weeks    Authorization - Visit Number 32    Progress Note Due on Visit 40    OT Start Time 1531    OT Stop Time 1615    OT Time Calculation (min) 44 min    Activity Tolerance Patient tolerated treatment well    Behavior During Therapy WFL for tasks assessed/performed                            Past Medical History:  Diagnosis Date   Allergy     Anxiety    Arthritis    hands   Cataract    Diabetes mellitus without complication (HCC)    type 2   Family history of adverse reaction to anesthesia    son has malignant hyperthermia, daughter does not daughter recently had c section without problems   GERD (gastroesophageal reflux disease)    Headache    sinus   Hyperlipidemia    Hypertension    PONV (postoperative nausea and vomiting)    nausea only   Ulcer, stomach peptic yrs ago   Past Surgical History:  Procedure Laterality Date   APPENDECTOMY     both hells bone spur repair     both heels with metal clips   both shoulder rotator cuff repair     CESAREAN SECTION     x 1   COLONOSCOPY WITH PROPOFOL  N/A 09/07/2016   Procedure: COLONOSCOPY WITH PROPOFOL ;  Surgeon: Vicci Gladis POUR, MD;  Location: [facility];  Service: Endoscopy;  Laterality:  N/A;   colonscopy  06/2011   polyps   ELBOW FRACTURE SURGERY Left    EYE SURGERY     FRACTURE SURGERY     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/10/2022   Procedure: REVERSE SHOULDER ARTHROPLASTY;  Surgeon: Melita Drivers, MD;  Location: [facility];  Service: Orthopedics;  Laterality: Right;  Please follow in room 6 if able   TUBAL LIGATION     VESICO-VAGINAL FISTULA REPAIR     Patient Active Problem List   Diagnosis Date Noted   Gastroesophageal reflux disease 11/16/2021   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 08/18/2021   Dermatofibroma 08/18/2021   History of malignant neoplasm of skin 08/18/2021   Lentigo 08/18/2021  Melanocytic nevi of trunk 08/18/2021   Nevus lipomatosus cutaneous superficialis 08/18/2021   Rosacea 08/18/2021   Actinic keratosis 08/18/2021   Sensorineural hearing loss (SNHL) of left ear with unrestricted hearing of right ear 07/26/2021   Tinnitus of left ear 07/26/2021   Acute recurrent maxillary sinusitis 06/22/2021   Primary hypertension 01/12/2021   Hyperlipidemia associated with type 2 diabetes mellitus (HCC) 01/12/2021   Arthritis 01/12/2021   Type II diabetes mellitus (HCC) 11/25/2019   Ingrown toenail 09/05/2018   Neck pain 01/29/2018   Tick bite 10/24/2017    ONSET DATE: 05/19/23  REFERRING DIAG:  Diagnosis  M79.641,M79.642 (ICD-10-CM) - Bilateral hand pain    THERAPY DIAG:  Muscle weakness (generalized)  Stiffness of right shoulder, not elsewhere classified  Stiffness of left hand, not elsewhere classified  Stiffness of right hand, not elsewhere classified  Pain in right hand  Pain in left hand  Other lack of coordination  Rationale for Evaluation and Treatment: Rehabilitation  SUBJECTIVE:   SUBJECTIVE STATEMENT: Pt reports  her hands felt looser after last OT session Pt accompanied by: self  PERTINENT HISTORY: 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 Yes: NPRS scale: 4/10 Pain location: right hand,  left hand Pain description: aching, stiff Aggravating factors: gripping  Relieving factors: heat, meds Pt also has shoulder pain which is more significant grossly R shoulder 7/10 L shoulder pain 3/10 which OT will not address. PT is addressing shoulder FALLS: Has patient fallen in last 6 months? No  LIVING ENVIRONMENT: Lives with: lives alone Lives in: House/apartment Stairs: yes   PLOF: Independent  PATIENT GOALS: improve functional use of hands and learn adapted strategies for ADLs/IADLs.  NEXT MD VISIT: unknown  OBJECTIVE:  Note: Objective measures were completed at Evaluation unless otherwise noted.  HAND DOMINANCE: Right  ADLs: Overall ADLs: unable to grip credit card to remove from ATM Transfers/ambulation related to ADLs: mod I Eating: drops silverware Grooming: drops toothbrush, uses electric toothbrush, holding comb Upper body dressing: adjusting bra Lower body dressing: difficulty pulling up pants  Toileting: hygeine was difficult initally Bathing: mod I, difficult with shaving Tub shower transfers: walk in shower    FUNCTIONAL OUTCOME MEASURES: Quick Dash: 05/30/23- 75%% disability Quick Dash 08/15/23- 70.5% disability   UPPER EXTREMITY ROM:   RUE wrist flexion/ extension: 70/ 50, LUE wrist flexion/ extension: 70/ 45 Pt demonstrates grossly 50% composite flexion for right and 555 with left.   Active ROM Right eval Left eval  Thumb MCP (0-60) 45 55  Thumb IP (0-80) 25 10  Thumb Radial abd/add (0-55)     Thumb Palmar abd/add (0-45)     Thumb Opposition to Small Finger yes yes   Index MCP (0-90)     Index PIP (0-100)     Index DIP (0-70)      Long MCP (0-90)      Long PIP (0-100)      Long DIP (0-70)      Ring  MCP (0-90)      Ring PIP (0-100)      Ring DIP (0-70)      Little MCP (0-90)      Little PIP (0-100)      Little DIP (0-70)      (Blank rows = not  tested) 9 hole peg test (08/15/23) RUE 26.70 secs, LUE 23 secs  HAND FUNCTION: Grip strength: Right: 8 lbs; Left: 4 lbs,          08/15/23-  RUE 18, LUE 14 lbs,           08/15/23   Pinch: RUE tip 4lbs, lateral 8 lbs, LUE tip 3 lbs, lateral 8 lbs   SENSATION: Light touch: Impaired index finger LUE  EDEMA: Pt with bony defomities at PIP joints of all digits which pt reports are bone spurs, Pt also has bony deformity at DIP for right thumb, index and 5th digit and LUE small and index fingers  COGNITION: Overall cognitive status: Within functional limits for tasks assessed   OBSERVATIONS: Pleasant female desires to increase functional use of bilateral UE's  , TREATMENT DATE:10/24/23-Paraffin to RUE x 10 mins, no adverse reactions, while therapist performed US  to left hand 3.3 mhz, 0.8 w/cm 2,  20%x 8 mins no adverse reactions for stiffness. Paraffin to LUE x 10 mins, no adverse reactions, while therapist performed US  to right hand 3.3 mhz, 0.8 w/cm 2,  20%x 8 mins no adverse reactions for stiffness.  Gentle soft tissue and joint mobs/ distraction to digits, hand, wrist bilaterally as welll as right forearm,  Passive finger flexion to bilateral hands. Therpist checked 9 hole peg test, see goals. 10/17/23-Paraffin to RUE x 10 mins, no adverse reactions, while therapist performed US  to left hand 3.3 mhz, 0.8 w/cm 2,  20%x 8 mins no adverse reactions for stiffness. Paraffin to LUE x 10 mins, no adverse reactions, while therapist performed US  to right hand 3.3 mhz, 0.8 w/cm 2,  20%x 8 mins no adverse reactions for stiffness.  Gentle soft tissue and joint mobs/ distraction to digits, hand, wrist bilaterally as welll as right forearm, pt reports  hands and wrists are feeling looser at end of session. Passive composite finger flexion to right hand and  then individual finger flexion to left hand . Therapist checked grip strength:  RUE 23 lbs LUE 25 lbs, pt demonstrates bilateral improvements and she reports continuing improving functional use.   10/03/23 Paraffin to RUE x 10 mins, no adverse reactions, while therapist performed US  to left hand 3.3 mhz, 0.8 w/cm 2,  20%x 8 mins no adverse reactions for stiffness. Paraffin to LUE x 10 mins, no adverse reactions, while therapist performed US  to right hand 3.3 mhz, 0.8 w/cm 2,  20%x 8 mins no adverse reactions for stiffness.  Passive composite finger flexion to right hand and then individual finger flexion to left hand . Gentle soft tissue and joint mobs/ distraction to digits, hand, wrist bilaterally as welll as right foream, pt reports feeling looser Resistive gripping with  tree squeeze ball left and right UE's digi flex, light resistance for composite and individual finger flexion for LUE   PATIENT EDUCATION: Education details:   see above Person educated: Patient Education method: Explanation, demonstration, v.c Education comprehension: verbalized understanding, returned demonstration  HOME EXERCISE PROGRAM: AROM yellow putty  GOALS: Goals reviewed with patient? Yes  SHORT TERM GOALS: Target date: 09/14/23  I with HEP  Goal status:  met 06/07/23  2.  I with positioning/ splinting prn to minimize pain and defomity   Goal status: met, 07/13/23  3.  Pt will increase bilateral grip strength by 5 lbs for increased UE functional use. Upgraded goal Pt will increase bilateral grip strength to at least 25 lbs (after stretching). Baseline: 05/30/23- RUE 8 lbs, LUE 4  lbs Goal status:met 08/01/23-R 20 lbs, L  19 lbs,  08/15/23- RUE 18 lbsLUE 14 lbs continue to address upgraded goal  - RUE 19 lbs, LUE 24- 10/03/23  RUE 23 lbs LUE 25 lbs, met for LUE, ongoing for right. 10/17/23  4. Pt will demonstrate improved RUE fine motor coordination as evidenced by perfroming 9 hole peg test in 23 secs or  less.  Goal status: ongoing 10/17/23, ongoing 26.68- 10/24/23 5. I with updates to HEP.  Goal status: ongoing 10/17/23 6. Pt will demonstrate improved composite finger flexion for ADLs as evidenced by pt. bringing middle fingertip for RUE within 1/2 an inch of palm.  Baseline: 3/4 inch from palm to middle fingertip  Goal status : ongoing, 3/4 inch 09/19/23, 1 inch from palm 10/24/23 7.Pt will demonstrate improved composite finger flexion for ADLs as evidenced by pt. bringing middle fingertip for LUE within 3/4 an inch of palm.  Baseline: 1 inch from palm to middle finger tip  Goal status: met, 3/4 inch away from middle finger 09/19/23  LONG TERM GOALS: Target date:11/07/23  I with updated HEP  Goal status:met 08/15/22  2.  Pt will improve Quick Dash score to 70% disability Baseline: 75% (05/30/23) Goal status:   improved - however not fully met due to complications from shoulder pain-70.5%08/15/23  3.  Pt will demonstrate at least 65% composte flexion for LUE for increased functional use.  Goal status: met 65-70% , 08/15/23  4.  I with adapted strategies/ adapted equipment to minimize pain and to increase pt I with ADLs/IADLs  Goal status:  met for inital strategies, however continue goal as pt may report additional tasks that can benefit from modification.ongoing 10/03/23  5.  Pt will demonstrate at least 65% composite flexion for RUE for increased functional use.  Goal status: met 70% 08/15/23  6. Pt will report that she is able to cut her meat such as steak or chicken modified independently.  [place]: unable  Goal status: met 09/21/23    7. Pt will report increased ease with opening/ closing ziplock bags.    Goal status: met 09/21/23    8. Pt will improve bilateral tip pinch by 2 lbs for increased ease with daily activities.     Goal status: ongoing 09/19/23    9. Pt will improve bilateral lateral pinch by 2 lbs for increased ease with ADLs.    Goal status: ongoing  09/19/23   ASSESSMENT: CLINICAL IMPRESSION: Pt reports improved ability to use hands functionally for meal prep during VBS. Today hand pain is 4/10 which is improved overall. PERFORMANCE DEFICITS: in functional skills including ADLs, IADLs, coordination, sensation, edema, ROM, strength, pain, flexibility, Fine motor control, Gross motor control, endurance, decreased knowledge of precautions, decreased knowledge of use of DME, and UE functional use,, and psychosocial skills including coping strategies, environmental adaptation, habits, interpersonal interactions, and routines and behaviors.   IMPAIRMENTS: are limiting patient from ADLs, IADLs, rest and sleep, education, play, leisure, and social participation.   COMORBIDITIES: may have co-morbidities  that affects occupational performance. Patient will benefit from skilled OT to address above impairments and improve overall function.  MODIFICATION OR ASSISTANCE TO COMPLETE EVALUATION: No modification of tasks or assist necessary to complete an evaluation.  OT OCCUPATIONAL PROFILE AND HISTORY: Detailed assessment: Review of records and additional review of physical, cognitive, psychosocial history related to current functional performance.  CLINICAL DECISION MAKING: LOW - limited treatment options, no task modification necessary  REHAB POTENTIAL: Good  EVALUATION COMPLEXITY: Low  PLAN:  OT FREQUENCY: 2x/week  OT DURATION: 12 weeks( anticipate d/c after 8 weeks dependent on progress)  PLANNED INTERVENTIONS: 97168 OT Re-evaluation, 97535 self care/ADL training, 02889 therapeutic exercise, 97530 therapeutic activity, 97112 neuromuscular re-education, 97140 manual therapy, 97113 aquatic therapy, 97035 ultrasound, 97018 paraffin, 02960 fluidotherapy, 97010 moist heat, 97010 cryotherapy, 97034 contrast bath, 97760 Orthotics management and training, 02239 Splinting (initial encounter), [phone] Subsequent splinting/medication, passive range of  motion, energy conservation, coping strategies training, patient/family education, and DME and/or AE instructions  RECOMMENDED OTHER SERVICES: none  CONSULTED AND AGREED WITH PLAN OF CARE: Patient  PLAN FOR NEXT SESSION:   functional use and ROM for bilateral hands   Donette Mainwaring, OT 11/29/2023, 4:29 PM                     OUTPATIENT OCCUPATIONAL THERAPY ORTHO Treatment  Patient Name: Veronica Galvan MRN: [id number] DOB:14-Feb-1956, 68 y.o., female Today's Date: 11/29/2023  PCP: Leita Eliza Elbe [name] REFERRING PROVIDER: Leita Eliza Elbe [name]  END OF SESSION:  OT End of Session - 11/29/23 1613     Visit Number 32    Number of Visits 35    Date for OT Re-Evaluation 12/13/23    Authorization Type [employer] MCR    Authorization Time Period 12 weeks    Authorization - Visit Number 32    Progress Note Due on Visit 40    OT Start Time 1531    OT Stop Time 1615    OT Time Calculation (min) 44 min    Activity Tolerance Patient tolerated treatment well    Behavior During Therapy WFL for tasks assessed/performed                            Past Medical History:  Diagnosis Date   Allergy     Anxiety    Arthritis    hands   Cataract    Diabetes mellitus without complication (HCC)    type 2   Family history of adverse reaction to anesthesia    son has malignant hyperthermia, daughter does not daughter recently had c section without problems   GERD (gastroesophageal reflux disease)    Headache    sinus   Hyperlipidemia    Hypertension    PONV (postoperative nausea and vomiting)    nausea only   Ulcer, stomach peptic yrs ago   Past Surgical History:  Procedure Laterality Date   APPENDECTOMY     both hells bone spur repair     both heels with metal clips   both shoulder rotator cuff repair     CESAREAN SECTION     x 1   COLONOSCOPY WITH PROPOFOL  N/A 09/07/2016   Procedure: COLONOSCOPY WITH PROPOFOL ;  Surgeon: Vicci Gladis POUR,  MD;  Location: [facility];  Service: Endoscopy;  Laterality: N/A;   colonscopy  06/2011   polyps   ELBOW FRACTURE SURGERY Left    EYE SURGERY     FRACTURE SURGERY     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/10/2022   Procedure: REVERSE SHOULDER ARTHROPLASTY;  Surgeon: Melita Drivers, MD;  Location: [facility];  Service: Orthopedics;  Laterality: Right;  Please follow in room 6 if able   TUBAL LIGATION     VESICO-VAGINAL FISTULA REPAIR     Patient Active Problem List   Diagnosis Date Noted   Gastroesophageal reflux disease 11/16/2021   Asymptomatic varicose veins of bilateral lower extremities 08/18/2021   Dermatofibroma 08/18/2021  History of malignant neoplasm of skin 08/18/2021   Lentigo 08/18/2021   Melanocytic nevi of trunk 08/18/2021   Nevus lipomatosus cutaneous superficialis 08/18/2021   Rosacea 08/18/2021   Actinic keratosis 08/18/2021   Sensorineural hearing loss (SNHL) of left ear with unrestricted hearing of right ear 07/26/2021   Tinnitus of left ear 07/26/2021   Acute recurrent maxillary sinusitis 06/22/2021   Primary hypertension 01/12/2021   Hyperlipidemia associated with type 2 diabetes mellitus (HCC) 01/12/2021   Arthritis 01/12/2021   Type II diabetes mellitus (HCC) 11/25/2019   Ingrown toenail 09/05/2018   Neck pain 01/29/2018   Tick bite 10/24/2017    ONSET DATE: 05/19/23  REFERRING DIAG:  Diagnosis  M79.641,M79.642 (ICD-10-CM) - Bilateral hand pain    THERAPY DIAG:  Muscle weakness (generalized)  Stiffness of right shoulder, not elsewhere classified  Stiffness of left hand, not elsewhere classified  Stiffness of right hand, not elsewhere classified  Pain in right hand  Pain in left hand  Other lack of coordination  Rationale for Evaluation and Treatment: Rehabilitation  SUBJECTIVE:   SUBJECTIVE STATEMENT: Pt reports  her hands felt looser after last OT session Pt accompanied by: self  PERTINENT HISTORY: S/P right reverse total  shoulder arthroplasty 11/10/23- Dr. 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 Yes: NPRS scale: 7/10- right hand Pain location: right hand,  3/10 left hand Pain description: aching, stiff Aggravating factors: gripping  Relieving factors: heat, meds Pt also has shoulder pain which is more significant grossly R shoulder 8/10 L shoulder pain 3/10 which OT will not address. PT is addressing shoulder FALLS: Has patient fallen in last 6 months? No  LIVING ENVIRONMENT: Lives with: lives alone Lives in: House/apartment Stairs: yes   PLOF: Independent  PATIENT GOALS: improve functional use of hands and learn adapted strategies for ADLs/IADLs.  NEXT MD VISIT: unknown  OBJECTIVE:  Note: Objective measures were completed at Evaluation unless otherwise noted.  HAND DOMINANCE: Right  ADLs: Overall ADLs: unable to grip credit card to remove from ATM Transfers/ambulation related to ADLs: mod I Eating: drops silverware Grooming: drops toothbrush, uses electric toothbrush, holding comb Upper body dressing: adjusting bra Lower body dressing: difficulty pulling up pants  Toileting: hygeine was difficult initally Bathing: mod I, difficult with shaving Tub shower transfers: walk in shower    FUNCTIONAL OUTCOME MEASURES: Quick Dash: 05/30/23- 75%% disability Quick Dash 08/15/23- 70.5% disability   UPPER EXTREMITY ROM:   RUE wrist flexion/ extension: 70/ 50, LUE wrist flexion/ extension: 70/ 45 Pt demonstrates grossly 50% composite flexion for right and 555 with left.   Active ROM Right eval Left eval  Thumb MCP (0-60) 45 55  Thumb IP (0-80) 25 10  Thumb Radial abd/add (0-55)     Thumb Palmar abd/add (0-45)     Thumb Opposition to Small Finger yes yes   Index MCP (0-90)     Index PIP (0-100)     Index DIP  (0-70)      Long MCP (0-90)      Long PIP (0-100)      Long DIP (0-70)      Ring MCP (0-90)      Ring PIP (0-100)      Ring DIP (0-70)      Little MCP (0-90)      Little PIP (0-100)  Little DIP (0-70)      (Blank rows = not tested) 9 hole peg test (08/15/23) RUE 26.70 secs, LUE 23 secs  HAND FUNCTION: Grip strength: Right: 8 lbs; Left: 4 lbs,          08/15/23-  RUE 18, LUE 14 lbs,           08/15/23   Pinch: RUE tip 4lbs, lateral 8 lbs, LUE tip 3 lbs, lateral 8 lbs   SENSATION: Light touch: Impaired index finger LUE  EDEMA: Pt with bony defomities at PIP joints of all digits which pt reports are bone spurs, Pt also has bony deformity at DIP for right thumb, index and 5th digit and LUE small and index fingers  COGNITION: Overall cognitive status: Within functional limits for tasks assessed   OBSERVATIONS: Pleasant female desires to increase functional use of bilateral UE's  , TREATMENT DATE:Paraffin to RUE x 10 mins, no adverse reactions, while therapist performed US  to left hand 3.3 mhz, 0.8 w/cm 2,  20%x 8 mins no adverse reactions for stiffness. Paraffin to LUE x 10 mins, no adverse reactions, while therapist performed US  to right hand 3.3 mhz, 0.8 w/cm 2,  20%x 8 mins no adverse reactions for stiffness.  Gentle soft tissue and joint mobs/ distraction to digits, hand, wrist bilaterally as welll as right forearm, pt reports  hands and wrists are feeling looser at end of session. Passive composite finger flexion to right hand and then individual finger flexion to left hand . Therapist checked grip strength:  RUE 23 lbs LUE 25 lbs, pt demonstrates bilateral improvements and she reports continuing improving functional use.   10/03/23 Paraffin to RUE x 10 mins, no adverse reactions, while therapist performed US  to left hand 3.3 mhz, 0.8 w/cm 2,  20%x 8 mins no adverse reactions for stiffness. Paraffin to LUE x 10 mins, no adverse reactions, while therapist performed US  to right  hand 3.3 mhz, 0.8 w/cm 2,  20%x 8 mins no adverse reactions for stiffness.  Passive composite finger flexion to right hand and then individual finger flexion to left hand . Gentle soft tissue and joint mobs/ distraction to digits, hand, wrist bilaterally as welll as right foream, pt reports feeling looser Resistive gripping with  tree squeeze ball left and right UE's digi flex, light resistance for composite and individual finger flexion for LUE   PATIENT EDUCATION: Education details:   see above Person educated: Patient Education method: Explanation, demonstration, v.c Education comprehension: verbalized understanding, returned demonstration  HOME EXERCISE PROGRAM: AROM yellow putty  GOALS: Goals reviewed with patient? Yes  SHORT TERM GOALS: Target date: 09/14/23  I with HEP  Goal status:  met 06/07/23  2.  I with positioning/ splinting prn to minimize pain and defomity   Goal status: met, 07/13/23  3.  Pt will increase bilateral grip strength by 5 lbs for increased UE functional use. Upgraded goal Pt will increase bilateral grip strength to at least 25 lbs (after stretching). Baseline: 05/30/23- RUE 8 lbs, LUE 4 lbs Goal status:met 08/01/23-R 20 lbs, L  19 lbs,  08/15/23- RUE 18 lbsLUE 14 lbs continue to address upgraded goal  - RUE 19 lbs, LUE 24- 10/03/23  RUE 23 lbs LUE 25 lbs, met for LUE, ongoing for right. 10/17/23  4. Pt will demonstrate improved RUE fine motor coordination as evidenced by perfroming 9 hole peg test in 23 secs or less.  Goal status: ongoing 10/17/23 5. I with updates to HEP.  Goal status:  ongoing 10/17/23 6. Pt will demonstrate improved composite finger flexion for ADLs as evidenced by pt. bringing middle fingertip for RUE within 1/2 an inch of palm.  Baseline: 3/4 inch from palm to middle fingertip  Goal status : ongoing, 3/4 inch 09/19/23 7.Pt will demonstrate improved composite finger flexion for ADLs as evidenced by pt. bringing middle fingertip for LUE  within 3/4 an inch of palm.  Baseline: 1 inch from palm to middle finger tip  Goal status: met, 3/4 inch away from middle finger 09/19/23  LONG TERM GOALS: Target date:11/07/23  I with updated HEP  Goal status:met 08/15/22  2.  Pt will improve Quick Dash score to 70% disability Baseline: 75% (05/30/23) Goal status:   improved - however not fully met due to complications from shoulder pain-70.5%08/15/23  3.  Pt will demonstrate at least 65% composte flexion for LUE for increased functional use.  Goal status: met 65-70% , 08/15/23  4.  I with adapted strategies/ adapted equipment to minimize pain and to increase pt I with ADLs/IADLs  Goal status:  met for inital strategies, however continue goal as pt may report additional tasks that can benefit from modification.ongoing 10/03/23  5.  Pt will demonstrate at least 65% composite flexion for RUE for increased functional use.  Goal status: met 70% 08/15/23  6. Pt will report that she is able to cut her meat such as steak or chicken modified independently.  [place]: unable  Goal status: met 09/21/23    7. Pt will report increased ease with opening/ closing ziplock bags.    Goal status: met 09/21/23    8. Pt will improve bilateral tip pinch by 2 lbs for increased ease with daily activities.     Goal status: ongoing 09/19/23    9. Pt will improve bilateral lateral pinch by 2 lbs for increased ease with ADLs.    Goal status: ongoing 09/19/23 increased ease rolling dice  ASSESSMENT: CLINICAL IMPRESSION: Pt demonstrates improving flexibility and grip strength.PERFORMANCE DEFICITS: in functional skills including ADLs, IADLs, coordination, sensation, edema, ROM, strength, pain, flexibility, Fine motor control, Gross motor control, endurance, decreased knowledge of precautions, decreased knowledge of use of DME, and UE functional use,, and psychosocial skills including coping strategies, environmental adaptation, habits, interpersonal interactions, and  routines and behaviors.   IMPAIRMENTS: are limiting patient from ADLs, IADLs, rest and sleep, education, play, leisure, and social participation.   COMORBIDITIES: may have co-morbidities  that affects occupational performance. Patient will benefit from skilled OT to address above impairments and improve overall function.  MODIFICATION OR ASSISTANCE TO COMPLETE EVALUATION: No modification of tasks or assist necessary to complete an evaluation.  OT OCCUPATIONAL PROFILE AND HISTORY: Detailed assessment: Review of records and additional review of physical, cognitive, psychosocial history related to current functional performance.  CLINICAL DECISION MAKING: LOW - limited treatment options, no task modification necessary  REHAB POTENTIAL: Good  EVALUATION COMPLEXITY: Low      PLAN:  OT FREQUENCY: 2x/week  OT DURATION: 12 weeks( anticipate d/c after 8 weeks dependent on progress)  PLANNED INTERVENTIONS: 02831 OT Re-evaluation, 97535 self care/ADL training, 02889 therapeutic exercise, 97530 therapeutic activity, 97112 neuromuscular re-education, 97140 manual therapy, 97113 aquatic therapy, 97035 ultrasound, 97018 paraffin, 02960 fluidotherapy, 97010 moist heat, 97010 cryotherapy, 97034 contrast bath, 97760 Orthotics management and training, 02239 Splinting (initial encounter), [id number] Subsequent splinting/medication, passive range of motion, energy conservation, coping strategies training, patient/family education, and DME and/or AE instructions  RECOMMENDED OTHER SERVICES: none  CONSULTED AND AGREED WITH PLAN OF  CARE: Patient  PLAN FOR NEXT SESSION:  check 9 hole peg test, functional use and ROM for bilateral hands   Javari Bufkin, OT 11/29/2023, 4:29 PM

## 2023-11-30 ENCOUNTER — Encounter: Payer: Self-pay | Admitting: Physical Therapy

## 2023-11-30 ENCOUNTER — Ambulatory Visit: Admitting: Physical Therapy

## 2023-11-30 DIAGNOSIS — G8929 Other chronic pain: Secondary | ICD-10-CM | POA: Diagnosis not present

## 2023-11-30 DIAGNOSIS — M542 Cervicalgia: Secondary | ICD-10-CM | POA: Diagnosis not present

## 2023-11-30 DIAGNOSIS — M6281 Muscle weakness (generalized): Secondary | ICD-10-CM

## 2023-11-30 DIAGNOSIS — M25641 Stiffness of right hand, not elsewhere classified: Secondary | ICD-10-CM | POA: Diagnosis not present

## 2023-11-30 DIAGNOSIS — R278 Other lack of coordination: Secondary | ICD-10-CM | POA: Diagnosis not present

## 2023-11-30 DIAGNOSIS — M25611 Stiffness of right shoulder, not elsewhere classified: Secondary | ICD-10-CM | POA: Diagnosis not present

## 2023-11-30 DIAGNOSIS — M25511 Pain in right shoulder: Secondary | ICD-10-CM | POA: Diagnosis not present

## 2023-11-30 DIAGNOSIS — M25642 Stiffness of left hand, not elsewhere classified: Secondary | ICD-10-CM | POA: Diagnosis not present

## 2023-11-30 DIAGNOSIS — R252 Cramp and spasm: Secondary | ICD-10-CM | POA: Diagnosis not present

## 2023-11-30 DIAGNOSIS — M79642 Pain in left hand: Secondary | ICD-10-CM | POA: Diagnosis not present

## 2023-11-30 DIAGNOSIS — M79641 Pain in right hand: Secondary | ICD-10-CM | POA: Diagnosis not present

## 2023-11-30 NOTE — Therapy (Signed)
 OUTPATIENT PHYSICAL THERAPY SHOULDER    Patient Name: Veronica Galvan MRN: [id number] DOB:28-Aug-1955, 68 y.o., female Today's Date: 11/30/2023  END OF SESSION:  PT End of Session - 11/30/23 1612     Visit Number [id number]    Date for PT Re-Evaluation 12/07/23    Authorization Type [employer] [place]    PT Start Time 1614    PT Stop Time 1715    PT Time Calculation (min) 61 min    Activity Tolerance Patient tolerated treatment well    Behavior During Therapy WFL for tasks assessed/performed          Past Medical History:  Diagnosis Date   Allergy     Anxiety    Arthritis    hands   Cataract    Diabetes mellitus without complication (HCC)    type 2   Family history of adverse reaction to anesthesia    son has malignant hyperthermia, daughter does not daughter recently had c section without problems   GERD (gastroesophageal reflux disease)    Headache    sinus   Hyperlipidemia    Hypertension    PONV (postoperative nausea and vomiting)    nausea only   Ulcer, stomach peptic yrs ago   Past Surgical History:  Procedure Laterality Date   APPENDECTOMY     both hells bone spur repair     both heels with metal clips   both shoulder rotator cuff repair     CESAREAN SECTION     x 1   COLONOSCOPY WITH PROPOFOL  N/A 09/07/2016   Procedure: COLONOSCOPY WITH PROPOFOL ;  Surgeon: Vicci Gladis POUR, MD;  Location: [facility];  Service: Endoscopy;  Laterality: N/A;   colonscopy  06/2011   polyps   ELBOW FRACTURE SURGERY Left    EYE SURGERY     FRACTURE SURGERY     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/10/2022   Procedure: REVERSE SHOULDER ARTHROPLASTY;  Surgeon: Melita Drivers, MD;  Location: [facility];  Service: Orthopedics;  Laterality: Right;  Please follow in room 6 if able   TUBAL LIGATION     VESICO-VAGINAL FISTULA REPAIR     Patient Active Problem List   Diagnosis Date Noted   Gastroesophageal reflux disease 11/16/2021   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 08/18/2021   Dermatofibroma 08/18/2021   History of malignant neoplasm of skin 08/18/2021   Lentigo 08/18/2021   Melanocytic nevi of trunk 08/18/2021   Nevus lipomatosus cutaneous superficialis 08/18/2021   Rosacea 08/18/2021   Actinic keratosis 08/18/2021   Sensorineural hearing loss (SNHL) of left ear with unrestricted hearing of right ear 07/26/2021   Tinnitus of left ear 07/26/2021   Acute recurrent maxillary sinusitis 06/22/2021   Primary hypertension 01/12/2021   Hyperlipidemia associated with type 2 diabetes mellitus (HCC) 01/12/2021   Arthritis 01/12/2021   Type II diabetes mellitus (HCC) 11/25/2019   Ingrown toenail 09/05/2018   Neck pain 01/29/2018   Tick bite 10/24/2017    PCP: [name], [name]  REFERRING PROVIDER: Melita, MD  REFERRING DIAG: s/p right reverse TSA  THERAPY DIAG:  Muscle weakness (generalized)  Stiffness of right shoulder, not elsewhere classified  Chronic right shoulder pain  Cervicalgia  Spasm  Rationale for Evaluation and Treatment: Rehabilitation  ONSET DATE: 11/05/22  SUBJECTIVE:  SUBJECTIVE STATEMENT:  Patient saw [name] today, is looking to get a CT scan, she was at the beach the past week.  She feels that she is in more pain now than prior to the beach trip.  CT tomorrow  Patient reports that she saw the surgeon she is frustrated and upset as she feels that she still has no answers, it is felt that there is no loosening and no infection.  She is still hurting with basic ADL's, her AROM decreased beginning in March, as she started having increased pain in mid January after a very busy and active few month, she has an order for the left shoulder and the okay for us  to see her for her right shoulder for another month PAIN:  Are you having pain? Yes:  NPRS scale: 7/10 Pain location: right shoulder, HA Pain description: dull ache at rest, sharp with motions Aggravating factors: quick motions, movements pain up to 10/10 Relieving factors: ice, rest, Tylenol  at best 3/10  PRECAUTIONS: Shoulder Protocol in the chart  RED FLAGS: None   WEIGHT BEARING RESTRICTIONS: No  FALLS:  Has patient fallen in last 6 months? Yes. Number of falls 1  LIVING ENVIRONMENT: Lives with: lives alone Lives in: House/apartment Stairs: No Has following equipment at home: None  OCCUPATION: retired  PLOF: Independent  PATIENT GOALS:dress without difficulty, do hair, have good ROM and less pain  NEXT MD VISIT:   OBJECTIVE:   DIAGNOSTIC FINDINGS:  See above  PATIENT SURVEYS:  FOTO 21  COGNITION: Overall cognitive status: Within functional limits for tasks assessed     SENSATION: WFL  POSTURE: Fwd head, rounded shoulders, elevated and guarded shoulder  UPPER EXTREMITY ROM:   Active ROM Right PROM eval Left AROM  08/31/23 PROM Supine 01/05/23 PROM  01/26/23 PROM 02/09/23 AROM sitting 02/14/23 AROM 02/21/23 AROM  03/02/23 AROM  03/22/23 AAROM 03/28/23 AROM Standing 04/13/23 AROM Standing 05/02/23 AROM  Standing 05/11/23 AROM  Standing 05/25/23 AROM Standing 06/01/23 AROM Standing  07/06/23 AROM  Standing 07/27/23 AROM Standing 08/31/23 AROM Standing  10/03/23 AROM 10/19/23 AROM 11/01/23 AROM 11/30/23  Shoulder flexion 35 90 118  123 90 96 95 100 111 110 112 120 125  129 132 130 105 115 110 R 120 L 135 R 120  Shoulder extension 5                       Shoulder abduction 20 95 90 92  73 80 83 90 93 90 92 98 100 102  105 95 81 90  R 97 L 130 Right 100  Shoulder adduction                        Shoulder internal rotation 15 35            40 40  42 25   R 28 L32 Right 28  Shoulder external rotation 20 65 45 30  41  45 50 53 56 56 60 66 67 70 70  47 60  R 45 L 75 Right 60  Elbow flexion 120                       Elbow extension 5                        Wrist flexion  Wrist extension                        Wrist ulnar deviation                        Wrist radial deviation                        Wrist pronation                        Wrist supination                        (Blank rows = not tested)  UPPER EXTREMITY MMT:  No tested due to recent surgery  MMT Right eval Left eval  Shoulder flexion    Shoulder extension    Shoulder abduction    Shoulder adduction    Shoulder internal rotation    Shoulder external rotation    Middle trapezius    Lower trapezius    Elbow flexion    Elbow extension    Wrist flexion    Wrist extension    Wrist ulnar deviation    Wrist radial deviation    Wrist pronation    Wrist supination    Grip strength (lbs)    (Blank rows = not tested)  PALPATION:  Very tight and tender in the pectoral, upper trap, the entire right upper arm   TODAY'S TREATMENT:                                                                                                                                         DATE:  11/30/23 PROM right shoulder Median nerve stretches  measured as noted above STM to the neck, upper traps, upper arms, teres and pectorals Vaso medium pressure 34 degrees  11/28/23 STM to the right and left upper trap, rhomboid and neck, STM to the right upper arm. Gentle PROM Vaso medium pressure to the right shoulder  11/16/23 Nustep level 5 x 6 minutes 15# rows 20# triceps 5# biceps Wall slides Ball vs wall 2 positions STM to the left upper arm, neck, upper traps and rhomboids Gentle stretches Vaso medium pressure  11/13/23 Nustep level 5 x 6 minutes 15# row 20# triceps 5# biceps Wall slides and circles AAROM with stick STM to the left shoulder and the right upper trap Ktape to the right shoulder for support   11/09/23 Nustep L 4 15# seated row 2 sets 10 Chest Press 5# 2 sets 10 Tricep ext 20# 2 sets 10 Bicep 5# 2 sets 10 2# cane ex for ROM  and strength standing Gentle PROM BIL shld STM to BIL cerv and shlds VASO     11/07/23 Nustep L 4 15# seated row 2 sets 10  Chest Press 5# 2 sets 10 US  left shld STM to the left upper trap and neck. Left shld gentle PROM Ktape X over large trigger point right upper trap and then two I strips to support shoulder Vaso low pressure 34 degrees  11/01/23 Niustep level 4 x 4 minutes 10# seated row Chest press 5# Wall slides/circles STM to the right upper trap and neck Ktape X over large trigger point right upper trap and then two I strips to support shoulder Vaso low pressure 34 degrees  10/31/23 STM to the left UE Passive stretch to bilateral UE's all motions some holds and some contract relax, needs a lot of cues to relax, slow focused stretches today Vaso to bilateral shoulders  10/26/23 UBE 5 minutes Red tand row and extension Ball roll ups Ball vs wall 2 different positions  20# triceps STM to the left shoulder and neck Some passive stretch to bilateral pecs and biceps Vaso low pressure 34 degrees  10/25/23 UBE level 2 x 4 minutes 10# row 2x10 5# biceps  15# triceps Red tband IR Yellow tband ER Supine 2# punches with PT limiting the ROM Supine 2# serratus 2# isometric circles 1# small ROM ER/IR supine Gentle PROM avoiding pain Vaso 34 degrees low pressure  10/19/23 Discussion of the plan the surgeon gave her and the plan that I feel we will do, we measured the ROM and looked at anatomy and what the MD notes say as she had a lot of questions. See plan below Some STM to the right upper trap  10/17/23 Gentle stretch of the neck and shoulders STM to the bilateral upper traps, rhomboids, teres and right pectoral mms Vaso to the right shoulder 35 degrees medium pressure  10/12/23 UBE level 4 x 6 minutes Passive stretch to bilateral shoulders STM to the left shoulder/upper arm STM to the right upper trap, rhomboid, neck and teres. Vaso medium pressure 34  degrees  10/10/23 UBE level 3 x 6 minutes PROM right shoulder to her tolerance all motions STM to the right upper trap, neck and rhomboid Vaso right shoulder medium pressure 34 degrees  10/05/23 UBE level 4 x 5 minutes Supine chest presses Supine flexion with 2# wate bar Passive stretch gentle right shoulder STM to the right upper trap and neck Vaso medium pressure  10/03/23 UBE level 3 x 6 minutes Supine wand chest press Supine wand flexion Passive stretch right shoulder  STM to the right upper arm and pectoral area Vaso 34 degrees right shoulder ROM measured as above PATIENT EDUCATION: Education details: poc/hep Person educated: Patient Education method: Programmer, multimedia, Facilities manager, Actor cues, Verbal cues, and Handouts Education comprehension: verbalized understanding  HOME EXERCISE PROGRAM: Access Code: [email or web address] URL: [email or web address] Date: 12/20/2022 Prepared by: Ozell Mainland  Exercises - Seated Shoulder Flexion Towel Slide at Table Top  - 2 x daily - 7 x weekly - 2 sets - 10 reps - 3 hold - Seated Shoulder External Rotation PROM on Table  - 2 x daily - 7 x weekly - 2 sets - 10 reps - 3 hold - Seated Elbow Extension and Shoulder External Rotation AAROM at Table with Towel  - 2 x daily - 7 x weekly - 2 sets - 10 reps - 3 hold - Circular Shoulder Pendulum with Table Support  - 2 x daily - 7 x weekly - 2 sets - 10 reps - 3 hold  Access Code: [email or web address] URL: [email or web address] Date: 07/13/2023 Prepared by: Ozell Mainland  Exercises - Isometric Shoulder Flexion  at Wall  - 1 x daily - 7 x weekly - 1 sets - 10 reps - 3 hold - Standing Isometric Shoulder Internal Rotation at Doorway  - 1 x daily - 7 x weekly - 1 sets - 10 reps - 3 hold - Isometric Shoulder Extension at Wall  - 1 x daily - 7 x weekly - 1 sets - 10 reps - 3 hold - Isometric Shoulder Abduction at Wall  - 1 x daily - 7 x weekly - 1 sets - 10 reps - 3 hold - Standing  Isometric Shoulder External Rotation with Doorway  - 1 x daily - 7 x weekly - 1 sets - 10 reps - 3 hold  ASSESSMENT:  CLINICAL IMPRESSION:  Patient has a CT scan tomorrow and then will follow up with [name], today is the last authorized visit from her surgeon.  She is still with some significant pain in the shoulders and the upper arm, the AROM is improved from a few months ago but not as good as in February.  Patient saw surgeon, the bone scan said did not feel that there was infection or loosening however the surgeon is not so sure, he wants to do an US  to be sure, that is scheduled for the next few weeks, since she has not been to PT in a month I re-measured her ROM, she has lost at least 15 degrees of motion and more for all motions.  She is also having more pain in the left shoulder possibly from overdoing it, because she is trying to not use the right shoulder.  She has talked about this with her primary and I suggested that she see a sports medicine MD, she called while she was here and will have appointment with him next week.  As far as the right shoulder the Surgeon stated in his notes to resume formal PT on a conservative basis to help with ROM and pain until the US  is performed   OBJECTIVE IMPAIRMENTS: cardiopulmonary status limiting activity, decreased activity tolerance, decreased endurance, decreased ROM, decreased strength, increased edema, increased muscle spasms, impaired flexibility, impaired UE functional use, improper body mechanics, postural dysfunction, and pain.  REHAB POTENTIAL: Good  CLINICAL DECISION MAKING: Evolving/moderate complexity  EVALUATION COMPLEXITY: Low   GOALS: Goals reviewed with patient? Yes  SHORT TERM GOALS: Target date: 01/01/23  Independent with initial HEP Goal status: 12/27/22 MET  LONG TERM GOALS: Target date: 03/22/23  Decrease pain 50% Goal status: progressing 11/01/23  2.  Dress without difficulty Goal status: progressing  11/01/23  3.   Do hair without difficulty Goal status: able to reach the top of head at times progressing 2/20/253/25/25 4.  Increase AROM right shoulder flexion to 130 degrees Goal status: 07/25/23 progressing 130 degrees with pain, since March has regressed but is better since march 11/01/23  5.  Increase right shoulder ER to 60 degrees Goal status: met 07/06/23  6.  Return to water  aerobics and or gym activity Goal status:progressing met 06/01/23  PLAN:  PT FREQUENCY: 1-2x/week  PT DURATION: 12 weeks  PLANNED INTERVENTIONS: Therapeutic exercises, Therapeutic activity, Neuromuscular re-education, Balance training, Gait training, Patient/Family education, Self Care, Joint mobilization, Dry Needling, Electrical stimulation, Cryotherapy, Vasopneumatic device, and Manual therapy  PLAN FOR NEXT SESSION: D/C  seeking a second opinion on the shoulder   11/30/2023, 4:17 PM [facility] [address] [facility] [place], [place], [address] Phone: [phone]   Fax:  [phone]

## 2023-12-01 ENCOUNTER — Ambulatory Visit
Admission: RE | Admit: 2023-12-01 | Discharge: 2023-12-01 | Disposition: A | Discharge status: Home / Self Care | Source: Ambulatory Visit | Attending: Orthopaedic Surgery | Admitting: Orthopaedic Surgery

## 2023-12-01 DIAGNOSIS — M25511 Pain in right shoulder: Secondary | ICD-10-CM | POA: Diagnosis not present

## 2023-12-01 DIAGNOSIS — G8929 Other chronic pain: Secondary | ICD-10-CM

## 2023-12-01 DIAGNOSIS — Z96611 Presence of right artificial shoulder joint: Secondary | ICD-10-CM | POA: Diagnosis not present

## 2023-12-04 ENCOUNTER — Ambulatory Visit: Payer: Self-pay

## 2023-12-04 ENCOUNTER — Encounter: Payer: Self-pay | Admitting: Family

## 2023-12-04 NOTE — Telephone Encounter (Signed)
 FYI Only or Action Required?: FYI only for provider.  Patient was last seen in primary care on 11/28/2023 by [name] Leita Repine, [name].  Called Nurse Triage reporting No chief complaint on file..  Symptoms began a week ago.  Interventions attempted: Prescription medications: Azithromycin , Prednisone .  Symptoms are: gradually worsening.  Triage Disposition: See Physician Within 24 Hours  Patient/caregiver understands and will follow disposition?: Yes    Copied from CRM [id number]. Topic: Clinical - Red Word Triage >> Dec 04, 2023  8:52 AM Ivette P wrote: Red Word that prompted transfer to Nurse Triage: green mucus, carry over from the antibiotic. Reason for Disposition  [1] Taking antibiotic > 48 hours (2 days) for strep throat AND [2] fever persists  Answer Assessment - Initial Assessment Questions 1. SYMPTOM: What's the main symptom you're concerned about? (e.g., fever, difficulty swallowing, sore throat)     Congestion, Cough (Productive-Green)  2. ANTIBIOTIC: What antibiotic are you taking? How many times a day?     Azithromycin    3. ONSET: When was the antibiotic started?     Last Week  4. THROAT PAIN:  How bad is the sore throat? (Scale 1-10; mild, moderate or severe)     Much better, to mild  5. FEVER: Do you have a fever? If Yes, ask: What is your temperature, how was it measured, and when did it start?     Low Grade Fever, Feeling Hot  6. OTHER SYMPTOMS: Do you have any other symptoms? (e.g., rash)     Congestion, Cough-Productive  7. BETTER-SAME-WORSE: Are you getting better, staying the same, or getting worse compared to the day you started the antibiotics?     Better to Same  8. PREGNANCY: Is there any chance you are pregnant? When was your last menstrual period?     No and No  Protocols used: Strep Throat Infection on Antibiotic Follow-up Call-A-[facility]

## 2023-12-04 NOTE — Telephone Encounter (Signed)
 Appt scheduled

## 2023-12-05 ENCOUNTER — Ambulatory Visit: Admitting: Family

## 2023-12-05 ENCOUNTER — Other Ambulatory Visit: Payer: Self-pay | Admitting: Family

## 2023-12-05 DIAGNOSIS — I700 Atherosclerosis of aorta: Secondary | ICD-10-CM

## 2023-12-05 DIAGNOSIS — I10 Essential (primary) hypertension: Secondary | ICD-10-CM

## 2023-12-05 DIAGNOSIS — E119 Type 2 diabetes mellitus without complications: Secondary | ICD-10-CM

## 2023-12-05 MED ORDER — DOXYCYCLINE HYCLATE 100 MG PO TABS: 100.0000 mg | ORAL_TABLET | Freq: Two times a day (BID) | ORAL | 0 refills | Status: DC

## 2023-12-06 ENCOUNTER — Encounter: Payer: Self-pay | Admitting: Occupational Therapy

## 2023-12-06 ENCOUNTER — Ambulatory Visit: Attending: Family | Admitting: Occupational Therapy

## 2023-12-06 DIAGNOSIS — R278 Other lack of coordination: Secondary | ICD-10-CM | POA: Diagnosis not present

## 2023-12-06 DIAGNOSIS — M6281 Muscle weakness (generalized): Secondary | ICD-10-CM | POA: Insufficient documentation

## 2023-12-06 DIAGNOSIS — M79641 Pain in right hand: Secondary | ICD-10-CM | POA: Diagnosis not present

## 2023-12-06 DIAGNOSIS — M79642 Pain in left hand: Secondary | ICD-10-CM | POA: Insufficient documentation

## 2023-12-06 DIAGNOSIS — M25641 Stiffness of right hand, not elsewhere classified: Secondary | ICD-10-CM | POA: Diagnosis not present

## 2023-12-06 DIAGNOSIS — M25642 Stiffness of left hand, not elsewhere classified: Secondary | ICD-10-CM | POA: Insufficient documentation

## 2023-12-06 NOTE — Therapy (Signed)
 OUTPATIENT OCCUPATIONAL THERAPY ORTHO Treatment  Patient Name: SAMA ARAUZ MRN: [id number] DOB:[birth year], 68 y.o., female Today's Date: 12/06/2023  PCP: Leita Eliza Elbe [name] REFERRING PROVIDER: Leita Eliza Elbe [name] OCCUPATIONAL THERAPY DISCHARGE SUMMARY    Current functional level related to goals / functional outcomes: Pt made excellent progress. she achieved 9/11 long term goals. she reports pain and bilateral UE functional use is improved.   Remaining deficits: pain, stiffness, decreased ROM, decreased strength.   Education / Equipment: Pt was educated regarding: AE/ activity modification and HEP. She demonstrates understanding of all education.   Patient agrees to discharge. Patient goals were partially met. Patient is being discharged due to meeting the stated rehab goals..    END OF SESSION:  OT End of Session - 12/06/23 1427     Visit Number 33    Number of Visits 35    Date for OT Re-Evaluation 12/13/23    Authorization Type [employer] MCR    Authorization Time Period 12 weeks    Authorization - Visit Number 33    Progress Note Due on Visit 40    OT Start Time 1401    OT Stop Time 1445    OT Time Calculation (min) 44 min    Activity Tolerance Patient tolerated treatment well    Behavior During Therapy WFL for tasks assessed/performed                            Past Medical History:  Diagnosis Date   Allergy     Anxiety    Arthritis    hands   Cataract    Diabetes mellitus without complication (HCC)    type 2   Family history of adverse reaction to anesthesia    son has malignant hyperthermia, daughter does not daughter recently had c section without problems   GERD (gastroesophageal reflux disease)    Headache    sinus   Hyperlipidemia    Hypertension    PONV (postoperative nausea and vomiting)    nausea only   Ulcer, stomach peptic yrs ago   Past Surgical History:  Procedure Laterality Date   APPENDECTOMY      both hells bone spur repair     both heels with metal clips   both shoulder rotator cuff repair     CESAREAN SECTION     x 1   COLONOSCOPY WITH PROPOFOL  N/A 09/07/2016   Procedure: COLONOSCOPY WITH PROPOFOL ;  Surgeon: Vicci Gladis POUR, MD;  Location: [facility];  Service: Endoscopy;  Laterality: N/A;   colonscopy  06/2011   polyps   ELBOW FRACTURE SURGERY Left    EYE SURGERY     FRACTURE SURGERY     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/10/2022   Procedure: REVERSE SHOULDER ARTHROPLASTY;  Surgeon: Melita Drivers, MD;  Location: [facility];  Service: Orthopedics;  Laterality: Right;  Please follow in room 6 if able   TUBAL LIGATION     VESICO-VAGINAL FISTULA REPAIR     Patient Active Problem List   Diagnosis Date Noted   Gastroesophageal reflux disease 11/16/2021   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 08/18/2021   Dermatofibroma 08/18/2021   History of malignant neoplasm of skin 08/18/2021   Lentigo 08/18/2021   Melanocytic nevi of trunk 08/18/2021   Nevus lipomatosus cutaneous superficialis 08/18/2021   Rosacea 08/18/2021   Actinic keratosis 08/18/2021   Sensorineural hearing loss (SNHL) of left ear with  unrestricted hearing of right ear 07/26/2021   Tinnitus of left ear 07/26/2021   Acute recurrent maxillary sinusitis 06/22/2021   Primary hypertension 01/12/2021   Hyperlipidemia associated with type 2 diabetes mellitus (HCC) 01/12/2021   Arthritis 01/12/2021   Type II diabetes mellitus (HCC) 11/25/2019   Ingrown toenail 09/05/2018   Neck pain 01/29/2018   Tick bite 10/24/2017  ONSET DATE: 05/19/23  REFERRING DIAG:  Diagnosis  M79.641,M79.642 (ICD-10-CM) - Bilateral hand pain    THERAPY DIAG:  No diagnosis found.  Rationale for Evaluation and Treatment: Rehabilitation  SUBJECTIVE:   SUBJECTIVE STATEMENT: Pt reports her hands feel much better and she has made so much progress. 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 Yes: NPRS scale: 3/10 on arrival Pain location: right hand,  left hand Pain description: aching, stiff Aggravating factors: gripping  Relieving factors: heat, meds Pt also has shoulder pain which is more significant grossly R shoulder 7/10 L shoulder pain 3/10 which OT will not address. PT is addressing shoulder FALLS: Has patient fallen in last 6 months? No  LIVING ENVIRONMENT: Lives with: lives alone Lives in: House/apartment Stairs: yes   PLOF: Independent  PATIENT GOALS: improve functional use of hands and learn adapted strategies for ADLs/IADLs.  NEXT MD VISIT: unknown  OBJECTIVE:  Note: Objective measures were completed at Evaluation unless otherwise noted.  HAND DOMINANCE: Right  ADLs: Overall ADLs: unable to grip credit card to remove from ATM Transfers/ambulation related to ADLs: mod I Eating: drops silverware Grooming: drops toothbrush, uses electric toothbrush, holding comb Upper body dressing: adjusting bra Lower body dressing: difficulty pulling up pants  Toileting: hygeine was difficult initally Bathing: mod I, difficult with shaving Tub shower transfers: walk in shower    FUNCTIONAL OUTCOME MEASURES: Quick Dash: 05/30/23- 75%% disability Quick Dash 08/15/23- 70.5% disability   UPPER EXTREMITY ROM:   RUE wrist flexion/ extension: 70/ 50, LUE wrist flexion/ extension: 70/ 45 Pt demonstrates grossly 50% composite flexion for right and 55 with left.   Active ROM Right eval Left eval  Thumb MCP (0-60) 45 55  Thumb IP (0-80) 25 10  Thumb Radial abd/add (0-55)     Thumb Palmar abd/add (0-45)     Thumb Opposition to Small Finger yes yes   Index MCP (0-90)     Index PIP (0-100)     Index  DIP (0-70)      Long MCP (0-90)      Long PIP (0-100)      Long DIP (0-70)      Ring MCP (0-90)      Ring PIP (0-100)      Ring DIP (0-70)      Little MCP (0-90)      Little PIP (0-100)      Little DIP (0-70)      (Blank rows = not tested) 9 hole peg test (08/15/23) RUE 26.70 secs, LUE 23 secs  HAND FUNCTION: Grip strength: Right: 8 lbs; Left: 4 lbs,          08/15/23-  RUE 18, LUE 14 lbs,           08/15/23   Pinch: RUE tip 4lbs, lateral 8 lbs, LUE tip 3 lbs, lateral 8 lbs  SENSATION: Light touch: Impaired index finger LUE  EDEMA: Pt with bony defomities at PIP joints of all digits which pt reports are bone spurs, Pt also has bony deformity at DIP for right thumb, index and 5th digit and LUE small and index fingers  COGNITION: Overall cognitive status: Within functional limits for tasks assessed   OBSERVATIONS: Pleasant female desires to increase functional use of bilateral UE's  , TREATMENT DATE:8/6/25Paraffin to RUE x 8 mins, no adverse reactions, while therapist performed US  to left hand 3.3 mhz, 0.8 w/cm 2,  20%x 8 mins no adverse reactions for stiffness. Paraffin to LUE x 8 mins, no adverse reactions, while therapist performed US  to right hand 3.3 mhz, 0.8 w/cm 2,  20%x 8 mins no adverse reactions for stiffness.  Soft tissue and joint mobs bilateral hands and wrists and forearms.   Passive finger flexion to bilateral hands RUE individual digits , LUE compositely.SABRA therapist checked progress towards goals and discussed plans for d/c. Pt made good overall progress.  11/29/23-Pt reports her hand pain is better and she used her foam grips at the beach along with rocker knife. Paraffin to RUE x 8 mins, no adverse reactions, while therapist performed US  to left hand 3.3 mhz, 0.8 w/cm 2,  20%x 8 mins no adverse reactions for stiffness. Paraffin to LUE x 8 mins, no adverse reactions, while therapist performed US  to right hand 3.3 mhz, 0.8 w/cm 2,  20%x 8 mins no adverse reactions  for stiffness.  Soft tissue and joint mobs bilateral hands and wrists and forearms.   Passive finger flexion to bilateral hands RUE individual digits , LUE compositely.SABRA Squeeze ball green tree composite flexion with RUE.  11/16/23-Paraffin to RUE x 8 mins, no adverse reactions, while therapist performed US  to left hand 3.3 mhz, 0.8 w/cm 2,  20%x 8 mins no adverse reactions for stiffness. Paraffin to LUE x 8 mins, no adverse reactions, while therapist performed US  to right hand 3.3 mhz, 0.8 w/cm 2,  20%x 8 mins no adverse reactions for stiffness.  Soft tissue and joint mobs bilateral hands and wrists.  Passive finger flexion to bilateral hands. Digi flex light restance for LUE indivisual and composite finger flexion. Squeeze ball green tree composite flexion with RUE. Discussed using a fork at home with difficulty, therapist recommends use of a foam grip.  11/07/23-Paraffin to RUE x 8 mins, no adverse reactions, while therapist performed US  to left hand 3.3 mhz, 0.8 w/cm 2,  20%x 8 mins no adverse reactions for stiffness. Paraffin to LUE x 8 mins, no adverse reactions, while therapist performed US  to right hand 3.3 mhz, 0.8 w/cm 2,  20%x 8 mins no adverse reactions for stiffness.  Passive finger flexion to bilateral hands. Therapist checked progress towards goals for PN/renewal as pt reports therapy has really helped her functional use of bilateral hands.  PATIENT EDUCATION: Education details:   see above Person educated: Patient Education method: Explanation, demonstration, v.c Education comprehension: verbalized understanding, returned demonstration  HOME EXERCISE PROGRAM: AROM yellow putty  GOALS: Goals reviewed with patient? Yes  SHORT TERM GOALS:   I with HEP  Goal status:  met 06/07/23  2.  I with positioning/ splinting prn to minimize pain and defomity   Goal status: met, 07/13/23  3.  Pt will increase bilateral grip strength by 5 lbs for increased UE functional  use. Upgraded goal Pt will increase bilateral grip strength to at least 25 lbs (after stretching). Baseline: 05/30/23- RUE 8 lbs, LUE 4  lbs Goal status:met 08/01/23-R 20 lbs, L  19 lbs,  08/15/23- RUE 18 lbsLUE 14 lbs continue to address upgraded goal  - RUE 19 lbs, LUE 24- 10/03/23  RUE 23 lbs LUE 25 lbs, met for LUE, ongoing for right. 10/17/23 11/07/23-RUE 20 lbs, LUE 30 lbs, met for LUE only, not met for RUE due to shoulder pain- now a long term goal  4. Pt will demonstrate improved RUE fine motor coordination as evidenced by perfroming 9 hole peg test in 23 secs or less.  Goal status:10/17/23, 26.68- 10/24/23, 10/08/23-27.42, not met due to shoulder pain which impedes RUE function  5. I with updates to HEP.  Goal status: ongoing-continue as long term goal- 11/07/23  6. Pt will demonstrate improved composite finger flexion for ADLs as evidenced by pt. bringing middle fingertip for RUE within 1/2 an inch of palm.  Baseline: 3/4 inch from palm to middle fingertip  Goal status  not met 3/4 inch 09/19/23, 1 inch from palm 10/24/23, 11/07/23- 1 inch, not met  7.Pt will demonstrate improved composite finger flexion for ADLs as evidenced by pt. bringing middle fingertip for LUE within 3/4 an inch of palm.  Baseline: 1 inch from palm to middle finger tip  Goal status: met, 3/4 inch away from middle finger 09/19/23  LONG TERM GOALS: Target date: 12/13/23  I with updated HEP  Goal status:met 08/15/22  2.  Pt will improve Quick Dash score to 70% disability Baseline: 75% (05/30/23) Goal status:   improved - however not fully met due to complications from shoulder pain-70.5% 08/15/23  3.  Pt will demonstrate at least 65% composite flexion for LUE for increased functional use.  Goal status: met 65-70% , 08/15/23  4.  I with adapted strategies/ adapted equipment to minimize pain and to increase pt I with ADLs/IADLs  Goal status:  met, pt verbalizes understanding of adapted strategies and equipment. 12/06/23  5.   Pt will demonstrate at least 65% composite flexion for RUE for increased functional use.  Goal status: met 70% 08/15/23  6. Pt will report that she is able to cut her meat such as steak or chicken modified independently.  [place]: unable  Goal status: met 09/21/23    7. Pt will report increased ease with opening/ closing ziplock bags.    Goal status: met 09/21/23    8. Pt will improve bilateral tip pinch by 2 lbs(from baseline) for increased ease with daily activities.                                baseline: RUE tip 4lbs, LUE tip 3 lbs,    Goal status:  8 lbs bilaterally, met 12/06/23    9. Pt will improve bilateral lateral pinch by 2 lbs for increased ease with ADLs.    Goal status: met RUE 12 lbs, LUE 14- 11/07/23   10 I with updates to HEP    goal status: deferred previous exercises are still appropriate- 12/06/23 11.Pt will demonstrate improved composite finger flexion for ADLs as evidenced by pt. bringing middle fingertip for RUE within 3/4 an inch of palm   following stretching. Baseline: 11/07/23- 1 inch from palm Goal status: not fully met 2/3 inch from palm 12/06/23    12. Pt willl increase RUE grip strength to 25 lbs for increased functional use. Baseline7/8/21- 20 lbs Goal status:RUE 28 lbs met- 11/29/23, (LUE 27 lbs) 12/06/23- RUE grip 40 lbs ASSESSMENT: CLINICAL  IMPRESSION:Pt made excellent overall progress. She achieved 9/11 long term goals and made excellent progress overall.   PERFORMANCE DEFICITS: in functional skills including ADLs, IADLs, coordination, sensation, edema, ROM, strength, pain, flexibility, Fine motor control, Gross motor control, endurance, decreased knowledge of precautions, decreased knowledge of use of DME, and UE functional use,, and psychosocial skills including coping strategies, environmental adaptation, habits, interpersonal interactions, and routines and behaviors.   IMPAIRMENTS: are limiting patient from ADLs, IADLs, rest and sleep, education, play,  leisure, and social participation.   COMORBIDITIES: may have co-morbidities  that affects occupational performance. Patient will benefit from skilled OT to address above impairments and improve overall function.  MODIFICATION OR ASSISTANCE TO COMPLETE EVALUATION: No modification of tasks or assist necessary to complete an evaluation.  OT OCCUPATIONAL PROFILE AND HISTORY: Detailed assessment: Review of records and additional review of physical, cognitive, psychosocial history related to current functional performance.  CLINICAL DECISION MAKING: LOW - limited treatment options, no task modification necessary  REHAB POTENTIAL: Good  EVALUATION COMPLEXITY: Low      PLAN:  OT FREQUENCY: 1x week  OT DURATION: 5 weeks  PLANNED INTERVENTIONS: 97168 OT Re-evaluation, 97535 self care/ADL training, 02889 therapeutic exercise, 97530 therapeutic activity, 97112 neuromuscular re-education, 97140 manual therapy, 97113 aquatic therapy, 97035 ultrasound, 97018 paraffin, 02960 fluidotherapy, 97010 moist heat, 97010 cryotherapy, 97034 contrast bath, 97760 Orthotics management and training, 02239 Splinting (initial encounter), [id number] Subsequent splinting/medication, passive range of motion, energy conservation, coping strategies training, patient/family education, and DME and/or AE instructions  RECOMMENDED OTHER SERVICES: none  CONSULTED AND AGREED WITH PLAN OF CARE: Patient  PLAN FOR NEXT SESSION: d/c OT

## 2023-12-06 NOTE — Therapy (Deleted)
 OUTPATIENT OCCUPATIONAL THERAPY ORTHO Treatment  Patient Name: Veronica Galvan MRN: [id number] DOB:Feb 04, 1956, 68 y.o., female Today's Date: 12/06/2023  PCP: Leita Eliza Elbe [name] REFERRING PROVIDER: Leita Eliza Elbe [name]  END OF SESSION:  OT End of Session - 12/06/23 1427     Visit Number 33    Number of Visits 35    Date for OT Re-Evaluation 12/13/23    Authorization Type BCBS MCR    Authorization Time Period 12 weeks    Authorization - Visit Number 33    Progress Note Due on Visit 40    OT Start Time 1401    OT Stop Time 1445    OT Time Calculation (min) 44 min    Activity Tolerance Patient tolerated treatment well    Behavior During Therapy WFL for tasks assessed/performed                            Past Medical History:  Diagnosis Date   Allergy     Anxiety    Arthritis    hands   Cataract    Diabetes mellitus without complication (HCC)    type 2   Family history of adverse reaction to anesthesia    son has malignant hyperthermia, daughter does not daughter recently had c section without problems   GERD (gastroesophageal reflux disease)    Headache    sinus   Hyperlipidemia    Hypertension    PONV (postoperative nausea and vomiting)    nausea only   Ulcer, stomach peptic yrs ago   Past Surgical History:  Procedure Laterality Date   APPENDECTOMY     both hells bone spur repair     both heels with metal clips   both shoulder rotator cuff repair     CESAREAN SECTION     x 1   COLONOSCOPY WITH PROPOFOL  N/A 09/07/2016   Procedure: COLONOSCOPY WITH PROPOFOL ;  Surgeon: Vicci Gladis POUR, MD;  Location: [facility];  Service: Endoscopy;  Laterality: N/A;   colonscopy  06/2011   polyps   ELBOW FRACTURE SURGERY Left    EYE SURGERY     FRACTURE SURGERY     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/10/2022   Procedure: REVERSE SHOULDER ARTHROPLASTY;  Surgeon: Melita Drivers, MD;  Location: [facility];  Service: Orthopedics;   Laterality: Right;  Please follow in room 6 if able   TUBAL LIGATION     VESICO-VAGINAL FISTULA REPAIR     Patient Active Problem List   Diagnosis Date Noted   Gastroesophageal reflux disease 11/16/2021   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 08/18/2021   Dermatofibroma 08/18/2021   History of malignant neoplasm of skin 08/18/2021   Lentigo 08/18/2021   Melanocytic nevi of trunk 08/18/2021   Nevus lipomatosus cutaneous superficialis 08/18/2021   Rosacea 08/18/2021   Actinic keratosis 08/18/2021   Sensorineural hearing loss (SNHL) of left ear with unrestricted hearing of right ear 07/26/2021   Tinnitus of left ear 07/26/2021   Acute recurrent maxillary sinusitis 06/22/2021   Primary hypertension 01/12/2021   Hyperlipidemia associated with type 2 diabetes mellitus (HCC) 01/12/2021   Arthritis 01/12/2021   Type II diabetes mellitus (HCC) 11/25/2019   Ingrown toenail 09/05/2018   Neck pain 01/29/2018   Tick bite 10/24/2017    ONSET DATE: 05/19/23  REFERRING DIAG:  Diagnosis  M79.641,M79.642 (ICD-10-CM) - Bilateral hand pain    THERAPY DIAG:  No diagnosis found.  Rationale for Evaluation and Treatment: Rehabilitation  SUBJECTIVE:   SUBJECTIVE STATEMENT: Pt reports her hands feel much better and she has made so much progress. 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 Yes: NPRS scale: 3/10 on arrival Pain location: right hand,  left hand Pain description: aching, stiff Aggravating factors: gripping  Relieving factors: heat, meds Pt also has shoulder pain which is more significant grossly R shoulder 7/10 L shoulder pain 3/10 which OT will not address. PT is addressing  shoulder FALLS: Has patient fallen in last 6 months? No  LIVING ENVIRONMENT: Lives with: lives alone Lives in: House/apartment Stairs: yes   PLOF: Independent  PATIENT GOALS: improve functional use of hands and learn adapted strategies for ADLs/IADLs.  NEXT MD VISIT: unknown  OBJECTIVE:  Note: Objective measures were completed at Evaluation unless otherwise noted.  HAND DOMINANCE: Right  ADLs: Overall ADLs: unable to grip credit card to remove from ATM Transfers/ambulation related to ADLs: mod I Eating: drops silverware Grooming: drops toothbrush, uses electric toothbrush, holding comb Upper body dressing: adjusting bra Lower body dressing: difficulty pulling up pants  Toileting: hygeine was difficult initally Bathing: mod I, difficult with shaving Tub shower transfers: walk in shower    FUNCTIONAL OUTCOME MEASURES: Quick Dash: 05/30/23- 75%% disability Quick Dash 08/15/23- 70.5% disability   UPPER EXTREMITY ROM:   RUE wrist flexion/ extension: 70/ 50, LUE wrist flexion/ extension: 70/ 45 Pt demonstrates grossly 50% composite flexion for right and 55 with left.   Active ROM Right eval Left eval  Thumb MCP (0-60) 45 55  Thumb IP (0-80) 25 10  Thumb Radial abd/add (0-55)     Thumb Palmar abd/add (0-45)     Thumb Opposition to Small Finger yes yes   Index MCP (0-90)     Index PIP (0-100)     Index DIP (0-70)      Long MCP (0-90)      Long PIP (0-100)      Long DIP (0-70)      Ring MCP (0-90)      Ring PIP (0-100)      Ring DIP (0-70)      Little MCP (0-90)      Little PIP (0-100)      Little DIP (0-70)      (Blank rows = not tested) 9 hole peg test (08/15/23) RUE 26.70 secs, LUE 23 secs  HAND FUNCTION: Grip strength: Right: 8 lbs; Left: 4 lbs,          08/15/23-  RUE 18, LUE 14 lbs,           08/15/23   Pinch: RUE tip 4lbs, lateral 8 lbs, LUE tip 3 lbs, lateral 8 lbs   SENSATION: Light touch: Impaired index finger LUE  EDEMA: Pt with bony defomities at  PIP joints of all digits which pt reports are bone spurs, Pt also has bony deformity at DIP for right thumb, index and 5th digit and LUE small and index fingers  COGNITION: Overall cognitive status: Within functional limits for tasks assessed   OBSERVATIONS: Pleasant female desires to increase functional use of bilateral UE's  , TREATMENT DATE:8/6/25Paraffin to RUE x 8 mins, no adverse reactions, while therapist performed US  to left hand  3.3 mhz, 0.8 w/cm 2,  20%x 8 mins no adverse reactions for stiffness. Paraffin to LUE x 8 mins, no adverse reactions, while therapist performed US  to right hand 3.3 mhz, 0.8 w/cm 2,  20%x 8 mins no adverse reactions for stiffness.  Soft tissue and joint mobs bilateral hands and wrists and forearms.   Passive finger flexion to bilateral hands RUE individual digits , LUE compositely.SABRA therapist checked progress towards goals and discussed plans for d/c. Pt made good overall progress.  11/29/23-Pt reports her hand pain is better and she used her foam grips at the beach along with rocker knife. Paraffin to RUE x 8 mins, no adverse reactions, while therapist performed US  to left hand 3.3 mhz, 0.8 w/cm 2,  20%x 8 mins no adverse reactions for stiffness. Paraffin to LUE x 8 mins, no adverse reactions, while therapist performed US  to right hand 3.3 mhz, 0.8 w/cm 2,  20%x 8 mins no adverse reactions for stiffness.  Soft tissue and joint mobs bilateral hands and wrists and forearms.   Passive finger flexion to bilateral hands RUE individual digits , LUE compositely.SABRA Squeeze ball green tree composite flexion with RUE.  11/16/23-Paraffin to RUE x 8 mins, no adverse reactions, while therapist performed US  to left hand 3.3 mhz, 0.8 w/cm 2,  20%x 8 mins no adverse reactions for stiffness. Paraffin to LUE x 8 mins, no adverse reactions, while therapist performed US  to right hand 3.3 mhz, 0.8 w/cm 2,  20%x 8 mins no adverse reactions for stiffness.  Soft tissue and joint mobs  bilateral hands and wrists.  Passive finger flexion to bilateral hands. Digi flex light restance for LUE indivisual and composite finger flexion. Squeeze ball green tree composite flexion with RUE. Discussed using a fork at home with difficulty, therapist recommends use of a foam grip.  11/07/23-Paraffin to RUE x 8 mins, no adverse reactions, while therapist performed US  to left hand 3.3 mhz, 0.8 w/cm 2,  20%x 8 mins no adverse reactions for stiffness. Paraffin to LUE x 8 mins, no adverse reactions, while therapist performed US  to right hand 3.3 mhz, 0.8 w/cm 2,  20%x 8 mins no adverse reactions for stiffness.  Passive finger flexion to bilateral hands. Therapist checked progress towards goals for PN/renewal as pt reports therapy has really helped her functional use of bilateral hands.  PATIENT EDUCATION: Education details:   see above Person educated: Patient Education method: Explanation, demonstration, v.c Education comprehension: verbalized understanding, returned demonstration  HOME EXERCISE PROGRAM: AROM yellow putty  GOALS: Goals reviewed with patient? Yes  SHORT TERM GOALS:   I with HEP  Goal status:  met 06/07/23  2.  I with positioning/ splinting prn to minimize pain and defomity   Goal status: met, 07/13/23  3.  Pt will increase bilateral grip strength by 5 lbs for increased UE functional use. Upgraded goal Pt will increase bilateral grip strength to at least 25 lbs (after stretching). Baseline: 05/30/23- RUE 8 lbs, LUE 4 lbs Goal status:met 08/01/23-R 20 lbs, L  19 lbs,  08/15/23- RUE 18 lbsLUE 14 lbs continue to address upgraded goal  - RUE 19 lbs, LUE 24- 10/03/23  RUE 23 lbs LUE 25 lbs, met for LUE, ongoing for right. 10/17/23 11/07/23-RUE 20 lbs, LUE 30 lbs, met for LUE only, not met for RUE due to shoulder pain- now a long term goal  4. Pt will demonstrate improved RUE fine motor coordination as evidenced by perfroming 9 hole peg test in 23 secs or  less.  Goal  status:10/17/23, 26.68- 10/24/23, 10/08/23-27.42, not met due to shoulder pain which impedes RUE function  5. I with updates to HEP.  Goal status: ongoing-continue as long term goal- 11/07/23  6. Pt will demonstrate improved composite finger flexion for ADLs as evidenced by pt. bringing middle fingertip for RUE within 1/2 an inch of palm.  Baseline: 3/4 inch from palm to middle fingertip  Goal status  not met 3/4 inch 09/19/23, 1 inch from palm 10/24/23, 11/07/23- 1 inch, not met  7.Pt will demonstrate improved composite finger flexion for ADLs as evidenced by pt. bringing middle fingertip for LUE within 3/4 an inch of palm.  Baseline: 1 inch from palm to middle finger tip  Goal status: met, 3/4 inch away from middle finger 09/19/23  LONG TERM GOALS: Target date: 12/13/23  I with updated HEP  Goal status:met 08/15/22  2.  Pt will improve Quick Dash score to 70% disability Baseline: 75% (05/30/23) Goal status:   improved - however not fully met due to complications from shoulder pain-70.5% 08/15/23  3.  Pt will demonstrate at least 65% composite flexion for LUE for increased functional use.  Goal status: met 65-70% , 08/15/23  4.  I with adapted strategies/ adapted equipment to minimize pain and to increase pt I with ADLs/IADLs  Goal status:  met, pt verbalizes understanding of adapted strategies and equipment. 12/06/23  5.  Pt will demonstrate at least 65% composite flexion for RUE for increased functional use.  Goal status: met 70% 08/15/23  6. Pt will report that she is able to cut her meat such as steak or chicken modified independently.  [place]: unable  Goal status: met 09/21/23    7. Pt will report increased ease with opening/ closing ziplock bags.    Goal status: met 09/21/23    8. Pt will improve bilateral tip pinch by 2 lbs(from baseline) for increased ease with daily activities.                                baseline: RUE tip 4lbs, LUE tip 3 lbs,    Goal status:  8 lbs bilaterally,  met 12/06/23    9. Pt will improve bilateral lateral pinch by 2 lbs for increased ease with ADLs.    Goal status: met RUE 12 lbs, LUE 14- 11/07/23   10 I with updates to HEP    goal status: deferred previous exercises are still appropriate- 12/06/23 11.Pt will demonstrate improved composite finger flexion for ADLs as evidenced by pt. bringing middle fingertip for RUE within 3/4 an inch of palm   following stretching. Baseline: 11/07/23- 1 inch from palm Goal status: not fully met 2/3 inch from palm 12/06/23    12. Pt willl increase RUE grip strength to 25 lbs for increased functional use. Baseline7/8/21- 20 lbs Goal status:RUE 28 lbs met- 11/29/23, (LUE 27 lbs) 12/06/23- RUE grip 40 lbs ASSESSMENT: CLINICAL IMPRESSION:Pt made excellent overall progress. she achieved    PERFORMANCE DEFICITS: in functional skills including ADLs, IADLs, coordination, sensation, edema, ROM, strength, pain, flexibility, Fine motor control, Gross motor control, endurance, decreased knowledge of precautions, decreased knowledge of use of DME, and UE functional use,, and psychosocial skills including coping strategies, environmental adaptation, habits, interpersonal interactions, and routines and behaviors.   IMPAIRMENTS: are limiting patient from ADLs, IADLs, rest and sleep, education, play, leisure, and social participation.   COMORBIDITIES: may have co-morbidities  that affects  occupational performance. Patient will benefit from skilled OT to address above impairments and improve overall function.  MODIFICATION OR ASSISTANCE TO COMPLETE EVALUATION: No modification of tasks or assist necessary to complete an evaluation.  OT OCCUPATIONAL PROFILE AND HISTORY: Detailed assessment: Review of records and additional review of physical, cognitive, psychosocial history related to current functional performance.  CLINICAL DECISION MAKING: LOW - limited treatment options, no task modification necessary  REHAB POTENTIAL:  Good  EVALUATION COMPLEXITY: Low      PLAN:  OT FREQUENCY: 1x week  OT DURATION: 5 weeks  PLANNED INTERVENTIONS: 97168 OT Re-evaluation, 97535 self care/ADL training, 02889 therapeutic exercise, 97530 therapeutic activity, 97112 neuromuscular re-education, 97140 manual therapy, 97113 aquatic therapy, 97035 ultrasound, 97018 paraffin, 02960 fluidotherapy, 97010 moist heat, 97010 cryotherapy, 97034 contrast bath, 97760 Orthotics management and training, 02239 Splinting (initial encounter), [phone] Subsequent splinting/medication, passive range of motion, energy conservation, coping strategies training, patient/family education, and DME and/or AE instructions  RECOMMENDED OTHER SERVICES: none  CONSULTED AND AGREED WITH PLAN OF CARE: Patient  PLAN FOR NEXT SESSION: check goals anticipate d/c   Angelle Isais, OT 12/06/2023, 2:28 PM                     OUTPATIENT OCCUPATIONAL THERAPY ORTHO Treatment  Patient Name: Veronica Galvan MRN: [id number] DOB:04/11/1956, 68 y.o., female Today's Date: 12/06/2023  PCP: Leita Eliza Elbe [name] REFERRING PROVIDER: Leita Eliza Elbe [name]  END OF SESSION:  OT End of Session - 12/06/23 1427     Visit Number 33    Number of Visits 35    Date for OT Re-Evaluation 12/13/23    Authorization Type [employer] MCR    Authorization Time Period 12 weeks    Authorization - Visit Number 33    Progress Note Due on Visit 40    OT Start Time 1401    OT Stop Time 1445    OT Time Calculation (min) 44 min    Activity Tolerance Patient tolerated treatment well    Behavior During Therapy WFL for tasks assessed/performed                            Past Medical History:  Diagnosis Date   Allergy     Anxiety    Arthritis    hands   Cataract    Diabetes mellitus without complication (HCC)    type 2   Family history of adverse reaction to anesthesia    son has malignant hyperthermia, daughter does not daughter recently had  c section without problems   GERD (gastroesophageal reflux disease)    Headache    sinus   Hyperlipidemia    Hypertension    PONV (postoperative nausea and vomiting)    nausea only   Ulcer, stomach peptic yrs ago   Past Surgical History:  Procedure Laterality Date   APPENDECTOMY     both hells bone spur repair     both heels with metal clips   both shoulder rotator cuff repair     CESAREAN SECTION     x 1   COLONOSCOPY WITH PROPOFOL  N/A 09/07/2016   Procedure: COLONOSCOPY WITH PROPOFOL ;  Surgeon: Vicci Gladis POUR, MD;  Location: [facility];  Service: Endoscopy;  Laterality: N/A;   colonscopy  06/2011   polyps   ELBOW FRACTURE SURGERY Left    EYE SURGERY     FRACTURE SURGERY     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/10/2022   Procedure:  REVERSE SHOULDER ARTHROPLASTY;  Surgeon: Melita Drivers, MD;  Location: [facility];  Service: Orthopedics;  Laterality: Right;  Please follow in room 6 if able   TUBAL LIGATION     VESICO-VAGINAL FISTULA REPAIR     Patient Active Problem List   Diagnosis Date Noted   Gastroesophageal reflux disease 11/16/2021   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 08/18/2021   Dermatofibroma 08/18/2021   History of malignant neoplasm of skin 08/18/2021   Lentigo 08/18/2021   Melanocytic nevi of trunk 08/18/2021   Nevus lipomatosus cutaneous superficialis 08/18/2021   Rosacea 08/18/2021   Actinic keratosis 08/18/2021   Sensorineural hearing loss (SNHL) of left ear with unrestricted hearing of right ear 07/26/2021   Tinnitus of left ear 07/26/2021   Acute recurrent maxillary sinusitis 06/22/2021   Primary hypertension 01/12/2021   Hyperlipidemia associated with type 2 diabetes mellitus (HCC) 01/12/2021   Arthritis 01/12/2021   Type II diabetes mellitus (HCC) 11/25/2019   Ingrown toenail 09/05/2018   Neck pain 01/29/2018   Tick bite 10/24/2017    ONSET DATE: 05/19/23  REFERRING DIAG:  Diagnosis  M79.641,M79.642  (ICD-10-CM) - Bilateral hand pain    THERAPY DIAG:  No diagnosis found.  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 Yes: NPRS scale: 6/10- right hand Pain location: right hand,  5/10 left hand Pain description: aching, stiff Aggravating factors: gripping  Relieving factors: heat, meds Pt also has shoulder pain which is more significant grossly R shoulder 8/10 L shoulder pain 3/10 which OT will not address. PT is addressing shoulder FALLS: Has patient fallen in last 6 months? No  LIVING ENVIRONMENT: Lives with: lives alone Lives in: House/apartment Stairs: yes   PLOF: Independent  PATIENT GOALS: improve functional use of hands and learn adapted strategies for ADLs/IADLs.  NEXT MD VISIT: unknown  OBJECTIVE:  Note: Objective measures were completed at Evaluation unless otherwise noted.  HAND DOMINANCE: Right  ADLs: Overall ADLs: unable to grip credit card to remove from ATM Transfers/ambulation related to ADLs: mod I Eating: drops silverware Grooming: drops toothbrush, uses electric toothbrush, holding comb Upper body dressing: adjusting bra Lower body dressing: difficulty pulling up pants  Toileting: hygeine was difficult initally Bathing: mod I, difficult with shaving Tub shower transfers: walk in shower    FUNCTIONAL OUTCOME MEASURES: Quick Dash: 05/30/23- 75%% disability Quick Dash 08/15/23- 70.5% disability   UPPER EXTREMITY ROM:   RUE wrist flexion/ extension: 70/ 50, LUE wrist flexion/ extension: 70/ 45 Pt demonstrates grossly 50%  composite flexion for right and 555 with left.   Active ROM Right eval Left eval  Thumb MCP (0-60) 45 55  Thumb IP (0-80) 25 10  Thumb Radial abd/add (0-55)     Thumb Palmar abd/add (0-45)     Thumb Opposition to Small Finger yes yes   Index MCP (0-90)     Index PIP (0-100)     Index DIP (0-70)      Long MCP (0-90)      Long PIP (0-100)      Long DIP (0-70)      Ring MCP (  0-90)      Ring PIP (0-100)      Ring DIP (0-70)      Little MCP (0-90)      Little PIP (0-100)      Little DIP (0-70)      (Blank rows = not tested) 9 hole peg test (08/15/23) RUE 26.70 secs, LUE 23 secs  HAND FUNCTION: Grip strength: Right: 8 lbs; Left: 4 lbs,          08/15/23-  RUE 18, LUE 14 lbs,           08/15/23   Pinch: RUE tip 4lbs, lateral 8 lbs, LUE tip 3 lbs, lateral 8 lbs   SENSATION: Light touch: Impaired index finger LUE  EDEMA: Pt with bony defomities at PIP joints of all digits which pt reports are bone spurs, Pt also has bony deformity at DIP for right thumb, index and 5th digit and LUE small and index fingers  COGNITION: Overall cognitive status: Within functional limits for tasks assessed   OBSERVATIONS: Pleasant female desires to increase functional use of bilateral UE's  , TREATMENT DATE:10/31/23-Paraffin to RUE x 8 mins, no adverse reactions, while therapist performed US  to left hand 3.3 mhz, 0.8 w/cm 2,  20%x 8 mins no adverse reactions for stiffness. Paraffin to LUE x 8 mins, no adverse reactions, while therapist performed US  to right hand 3.3 mhz, 0.8 w/cm 2,  20%x 8 mins no adverse reactions for stiffness.  Pt reports increased pain and stiffness after using her hands alot to prepare meals for [employer] last week. Pt was encouraged to rest her hands and use paraffin at home if possible. Therapist recommends avoiding use of putty for several days o allow pain to resolve. Discussed plans to renew next visit and to continue for another month. Gentle soft tissue and joint mobs/  distraction to digits, hand, wrist forearms bilaterally  Passive composite finger flexion to right hand and then individual finger flexion to left hand .  10/24/23-Paraffin to RUE x 10 mins, no adverse reactions, while therapist performed US  to left hand 3.3 mhz, 0.8 w/cm 2,  20%x 8 mins no adverse reactions for stiffness. Paraffin to LUE x 10 mins, no adverse reactions, while therapist performed US  to right hand 3.3 mhz, 0.8 w/cm 2,  20%x 8 mins no adverse reactions for stiffness.  Gentle soft tissue and joint mobs/ distraction to digits, hand, wrist bilaterally as welll as right forearm, pt reports  hands and wrists are feeling looser at end of session. Passive composite finger flexion to right hand and then individual finger flexion to left hand . Therapist checked grip strength:  RUE 23 lbs LUE 25 lbs, pt demonstrates bilateral improvements and she reports continuing improving functional use.   10/03/23 Paraffin to RUE x 10 mins, no adverse reactions, while therapist performed US  to left hand 3.3 mhz, 0.8 w/cm 2,  20%x 8 mins no adverse reactions for stiffness. Paraffin to LUE x 10 mins, no adverse reactions, while therapist performed US  to right hand 3.3 mhz, 0.8 w/cm 2,  20%x 8 mins no adverse reactions for stiffness.  Passive composite finger flexion to right hand and then individual finger flexion to left hand . Gentle soft tissue and joint mobs/ distraction to digits, hand, wrist bilaterally as welll as right foream, pt reports feeling looser Resistive gripping with  tree squeeze ball left and right UE's digi flex, light resistance for composite and individual finger flexion for LUE   PATIENT EDUCATION: Education details:  see above Person educated: Patient Education method: Explanation, demonstration, v.c Education comprehension: verbalized understanding, returned demonstration  HOME EXERCISE PROGRAM: AROM yellow putty  GOALS: Goals reviewed with patient? Yes  SHORT TERM GOALS:  Target date: 09/14/23  I with HEP  Goal status:  met 06/07/23  2.  I with positioning/ splinting prn to minimize pain and defomity   Goal status: met, 07/13/23  3.  Pt will increase bilateral grip strength by 5 lbs for increased UE functional use. Upgraded goal Pt will increase bilateral grip strength to at least 25 lbs (after stretching). Baseline: 05/30/23- RUE 8 lbs, LUE 4 lbs Goal status:met 08/01/23-R 20 lbs, L  19 lbs,  08/15/23- RUE 18 lbsLUE 14 lbs continue to address upgraded goal  - RUE 19 lbs, LUE 24- 10/03/23  RUE 23 lbs LUE 25 lbs, met for LUE, ongoing for right. 10/17/23  4. Pt will demonstrate improved RUE fine motor coordination as evidenced by perfroming 9 hole peg test in 23 secs or less.  Goal status: ongoing 10/17/23 5. I with updates to HEP.  Goal status: ongoing 10/17/23 6. Pt will demonstrate improved composite finger flexion for ADLs as evidenced by pt. bringing middle fingertip for RUE within 1/2 an inch of palm.  Baseline: 3/4 inch from palm to middle fingertip  Goal status : ongoing, 3/4 inch 09/19/23 7.Pt will demonstrate improved composite finger flexion for ADLs as evidenced by pt. bringing middle fingertip for LUE within 3/4 an inch of palm.  Baseline: 1 inch from palm to middle finger tip  Goal status: met, 3/4 inch away from middle finger 09/19/23  LONG TERM GOALS: Target date:11/07/23  I with updated HEP  Goal status:met 08/15/22  2.  Pt will improve Quick Dash score to 70% disability Baseline: 75% (05/30/23) Goal status:   improved - however not fully met due to complications from shoulder pain-70.5%08/15/23  3.  Pt will demonstrate at least 65% composte flexion for LUE for increased functional use.  Goal status: met 65-70% , 08/15/23  4.  I with adapted strategies/ adapted equipment to minimize pain and to increase pt I with ADLs/IADLs  Goal status:  met for inital strategies, however continue goal as pt may report additional tasks that can benefit from  modification.ongoing  10/31/23  5.  Pt will demonstrate at least 65% composite flexion for RUE for increased functional use.  Goal status: met 70% 08/15/23  6. Pt will report that she is able to cut her meat such as steak or chicken modified independently.  [place]: unable  Goal status: met 09/21/23    7. Pt will report increased ease with opening/ closing ziplock bags.    Goal status: met 09/21/23    8. Pt will improve bilateral tip pinch by 2 lbs for increased ease with daily activities.     Goal status: ongoing 10/31/23    9. Pt will improve bilateral lateral pinch by 2 lbs for increased ease with ADLs.    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   IMPAIRMENTS: are limiting patient from ADLs, IADLs, rest and sleep, education,  play, leisure, and social participation.   COMORBIDITIES: may have co-morbidities  that affects occupational performance. Patient will benefit from skilled OT to address above impairments and improve overall function.  MODIFICATION OR ASSISTANCE TO COMPLETE EVALUATION: No modification of tasks or assist necessary to complete an evaluation.  OT OCCUPATIONAL PROFILE AND HISTORY: Detailed assessment: Review of records and additional review of physical, cognitive, psychosocial history related to current functional performance.  CLINICAL DECISION MAKING: LOW - limited treatment options, no task modification necessary  REHAB  POTENTIAL: Good  EVALUATION COMPLEXITY: Low      PLAN:  OT FREQUENCY: 2x/week  OT DURATION: 12 weeks( anticipate d/c after 8 weeks dependent on progress)  PLANNED INTERVENTIONS: 02831 OT Re-evaluation, 97535 self care/ADL training, 02889 therapeutic exercise, 97530 therapeutic activity, 97112 neuromuscular re-education, 97140 manual therapy, 97113 aquatic therapy, 97035 ultrasound, 97018 paraffin, 02960 fluidotherapy, 97010 moist heat, 97010 cryotherapy, 97034 contrast bath, 97760 Orthotics management and training, 02239 Splinting (initial encounter), [phone] Subsequent splinting/medication, passive range of motion, energy conservation, coping strategies training, patient/family education, and DME and/or AE instructions  RECOMMENDED OTHER SERVICES: none  CONSULTED AND AGREED WITH PLAN OF CARE: Patient  PLAN FOR NEXT SESSION:  check  goals, renew, functional use and ROM for bilateral hands   Hadriel Northup, OT 12/06/2023, 2:28 PM                     OUTPATIENT OCCUPATIONAL THERAPY ORTHO Treatment  Patient Name: Veronica Galvan MRN: [id number] DOB:04-11-1956, 68 y.o., female Today's Date: 12/06/2023  PCP: Leita Eliza Elbe [name] REFERRING PROVIDER: Leita Eliza Elbe [name]  END OF SESSION:  OT End of Session - 12/06/23 1427     Visit Number 33    Number of Visits 35    Date for OT Re-Evaluation 12/13/23    Authorization Type [employer] MCR    Authorization Time Period 12 weeks    Authorization - Visit Number 33    Progress Note Due on Visit 40    OT Start Time 1401    OT Stop Time 1445    OT Time Calculation (min) 44 min    Activity Tolerance Patient tolerated treatment well    Behavior During Therapy WFL for tasks assessed/performed                            Past Medical History:  Diagnosis Date   Allergy     Anxiety    Arthritis    hands   Cataract    Diabetes mellitus without complication (HCC)    type 2   Family history of  adverse reaction to anesthesia    son has malignant hyperthermia, daughter does not daughter recently had c section without problems   GERD (gastroesophageal reflux disease)    Headache    sinus   Hyperlipidemia    Hypertension    PONV (postoperative nausea and vomiting)    nausea only   Ulcer, stomach peptic yrs ago   Past Surgical History:  Procedure Laterality Date   APPENDECTOMY     both hells bone spur repair     both heels with metal clips   both shoulder rotator cuff repair     CESAREAN SECTION     x 1   COLONOSCOPY WITH PROPOFOL  N/A 09/07/2016   Procedure: COLONOSCOPY WITH PROPOFOL ;  Surgeon: Vicci Gladis POUR, MD;  Location: [facility];  Service: Endoscopy;  Laterality: N/A;   colonscopy  06/2011   polyps  ELBOW FRACTURE SURGERY Left    EYE SURGERY     FRACTURE SURGERY     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/10/2022   Procedure: REVERSE SHOULDER ARTHROPLASTY;  Surgeon: Melita Drivers, MD;  Location: [facility];  Service: Orthopedics;  Laterality: Right;  Please follow in room 6 if able   TUBAL LIGATION     VESICO-VAGINAL FISTULA REPAIR     Patient Active Problem List   Diagnosis Date Noted   Gastroesophageal reflux disease 11/16/2021   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 08/18/2021   Dermatofibroma 08/18/2021   History of malignant neoplasm of skin 08/18/2021   Lentigo 08/18/2021   Melanocytic nevi of trunk 08/18/2021   Nevus lipomatosus cutaneous superficialis 08/18/2021   Rosacea 08/18/2021   Actinic keratosis 08/18/2021   Sensorineural hearing loss (SNHL) of left ear with unrestricted hearing of right ear 07/26/2021   Tinnitus of left ear 07/26/2021   Acute recurrent maxillary sinusitis 06/22/2021   Primary hypertension 01/12/2021   Hyperlipidemia associated with type 2 diabetes mellitus (HCC) 01/12/2021   Arthritis 01/12/2021   Type II diabetes mellitus (HCC) 11/25/2019   Ingrown toenail 09/05/2018   Neck pain 01/29/2018    Tick bite 10/24/2017

## 2023-12-06 NOTE — Therapy (Deleted)
 Elliott Quade, OT 12/06/2023, 5:55 PM                     OUTPATIENT OCCUPATIONAL THERAPY ORTHO Treatment  Patient Name: Veronica Galvan MRN: [id number] DOB:12-22-1955, 68 y.o., female Today's Date: 12/06/2023  PCP: Leita Eliza Elbe [name] REFERRING PROVIDER: Leita Eliza Elbe [name]  END OF SESSION:  OT End of Session - 12/06/23 1427     Visit Number 33    Number of Visits 35    Date for OT Re-Evaluation 12/13/23    Authorization Type [employer] MCR    Authorization Time Period 12 weeks    Authorization - Visit Number 33    Progress Note Due on Visit 40    OT Start Time 1401    OT Stop Time 1445    OT Time Calculation (min) 44 min    Activity Tolerance Patient tolerated treatment well    Behavior During Therapy WFL for tasks assessed/performed                            Past Medical History:  Diagnosis Date   Allergy     Anxiety    Arthritis    hands   Cataract    Diabetes mellitus without complication (HCC)    type 2   Family history of adverse reaction to anesthesia    son has malignant hyperthermia, daughter does not daughter recently had c section without problems   GERD (gastroesophageal reflux disease)    Headache    sinus   Hyperlipidemia    Hypertension    PONV (postoperative nausea and vomiting)    nausea only   Ulcer, stomach peptic yrs ago   Past Surgical History:  Procedure Laterality Date   APPENDECTOMY     both hells bone spur repair     both heels with metal clips   both shoulder rotator cuff repair     CESAREAN SECTION     x 1   COLONOSCOPY WITH PROPOFOL  N/A 09/07/2016   Procedure: COLONOSCOPY WITH PROPOFOL ;  Surgeon: Vicci Gladis POUR, MD;  Location: [facility];  Service: Endoscopy;  Laterality: N/A;   colonscopy  06/2011   polyps   ELBOW FRACTURE SURGERY Left    EYE SURGERY     FRACTURE SURGERY     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/10/2022   Procedure: REVERSE SHOULDER  ARTHROPLASTY;  Surgeon: Melita Drivers, MD;  Location: [facility];  Service: Orthopedics;  Laterality: Right;  Please follow in room 6 if able   TUBAL LIGATION     VESICO-VAGINAL FISTULA REPAIR     Patient Active Problem List   Diagnosis Date Noted   Gastroesophageal reflux disease 11/16/2021   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 08/18/2021   Dermatofibroma 08/18/2021   History of malignant neoplasm of skin 08/18/2021   Lentigo 08/18/2021   Melanocytic nevi of trunk 08/18/2021   Nevus lipomatosus cutaneous superficialis 08/18/2021   Rosacea 08/18/2021   Actinic keratosis 08/18/2021   Sensorineural hearing loss (SNHL) of left ear with unrestricted hearing of right ear 07/26/2021   Tinnitus of left ear 07/26/2021   Acute recurrent maxillary sinusitis 06/22/2021   Primary hypertension 01/12/2021   Hyperlipidemia associated with type 2 diabetes mellitus (HCC) 01/12/2021   Arthritis 01/12/2021   Type II diabetes mellitus (HCC) 11/25/2019   Ingrown toenail 09/05/2018   Neck pain 01/29/2018   Tick bite 10/24/2017  ONSET DATE: 05/19/23  REFERRING DIAG:  Diagnosis  M79.641,M79.642 (ICD-10-CM) - Bilateral hand pain    THERAPY DIAG:  Muscle weakness (generalized)  Stiffness of left hand, not elsewhere classified  Stiffness of right hand, not elsewhere classified  Pain in right hand  Pain in left hand  Other lack of coordination  Rationale for Evaluation and Treatment: Rehabilitation  SUBJECTIVE:   SUBJECTIVE STATEMENT: Pt reports  her hands felt looser after last OT session Pt accompanied by: self  PERTINENT HISTORY: 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 Yes: NPRS scale: 4/10 Pain location: right hand,   left hand Pain description: aching, stiff Aggravating factors: gripping  Relieving factors: heat, meds Pt also has shoulder pain which is more significant grossly R shoulder 7/10 L shoulder pain 3/10 which OT will not address. PT is addressing shoulder FALLS: Has patient fallen in last 6 months? No  LIVING ENVIRONMENT: Lives with: lives alone Lives in: House/apartment Stairs: yes   PLOF: Independent  PATIENT GOALS: improve functional use of hands and learn adapted strategies for ADLs/IADLs.  NEXT MD VISIT: unknown  OBJECTIVE:  Note: Objective measures were completed at Evaluation unless otherwise noted.  HAND DOMINANCE: Right  ADLs: Overall ADLs: unable to grip credit card to remove from ATM Transfers/ambulation related to ADLs: mod I Eating: drops silverware Grooming: drops toothbrush, uses electric toothbrush, holding comb Upper body dressing: adjusting bra Lower body dressing: difficulty pulling up pants  Toileting: hygeine was difficult initally Bathing: mod I, difficult with shaving Tub shower transfers: walk in shower    FUNCTIONAL OUTCOME MEASURES: Quick Dash: 05/30/23- 75%% disability Quick Dash 08/15/23- 70.5% disability   UPPER EXTREMITY ROM:   RUE wrist flexion/ extension: 70/ 50, LUE wrist flexion/ extension: 70/ 45 Pt demonstrates grossly 50% composite flexion for right and 555 with left.   Active ROM Right eval Left eval  Thumb MCP (0-60) 45 55  Thumb IP (0-80) 25 10  Thumb Radial abd/add (0-55)     Thumb Palmar abd/add (0-45)     Thumb Opposition to Small Finger yes yes   Index MCP (0-90)     Index PIP (0-100)     Index DIP (0-70)      Long MCP (0-90)      Long PIP (0-100)      Long DIP (0-70)      Ring MCP (0-90)      Ring PIP (0-100)      Ring DIP (0-70)      Little MCP (0-90)      Little PIP (0-100)      Little DIP (0-70)      (Blank rows = not tested) 9 hole peg test (08/15/23) RUE 26.70 secs, LUE 23 secs  HAND FUNCTION: Grip  strength: Right: 8 lbs; Left: 4 lbs,          08/15/23-  RUE 18, LUE 14 lbs,           08/15/23   Pinch: RUE tip 4lbs, lateral 8 lbs, LUE tip 3 lbs, lateral 8 lbs   SENSATION: Light touch: Impaired index finger LUE  EDEMA: Pt with bony defomities at PIP joints of all digits which pt reports are bone spurs, Pt also has bony deformity at DIP for right thumb, index and  5th digit and LUE small and index fingers  COGNITION: Overall cognitive status: Within functional limits for tasks assessed   OBSERVATIONS: Pleasant female desires to increase functional use of bilateral UE's  , TREATMENT DATE:10/24/23-Paraffin to RUE x 10 mins, no adverse reactions, while therapist performed US  to left hand 3.3 mhz, 0.8 w/cm 2,  20%x 8 mins no adverse reactions for stiffness. Paraffin to LUE x 10 mins, no adverse reactions, while therapist performed US  to right hand 3.3 mhz, 0.8 w/cm 2,  20%x 8 mins no adverse reactions for stiffness.  Gentle soft tissue and joint mobs/ distraction to digits, hand, wrist bilaterally as welll as right forearm,  Passive finger flexion to bilateral hands. Therpist checked 9 hole peg test, see goals. 10/17/23-Paraffin to RUE x 10 mins, no adverse reactions, while therapist performed US  to left hand 3.3 mhz, 0.8 w/cm 2,  20%x 8 mins no adverse reactions for stiffness. Paraffin to LUE x 10 mins, no adverse reactions, while therapist performed US  to right hand 3.3 mhz, 0.8 w/cm 2,  20%x 8 mins no adverse reactions for stiffness.  Gentle soft tissue and joint mobs/ distraction to digits, hand, wrist bilaterally as welll as right forearm, pt reports  hands and wrists are feeling looser at end of session. Passive composite finger flexion to right hand and then individual finger flexion to left hand . Therapist checked grip strength:  RUE 23 lbs LUE 25 lbs, pt demonstrates bilateral improvements and she reports continuing improving functional use.   10/03/23 Paraffin to RUE x 10 mins, no  adverse reactions, while therapist performed US  to left hand 3.3 mhz, 0.8 w/cm 2,  20%x 8 mins no adverse reactions for stiffness. Paraffin to LUE x 10 mins, no adverse reactions, while therapist performed US  to right hand 3.3 mhz, 0.8 w/cm 2,  20%x 8 mins no adverse reactions for stiffness.  Passive composite finger flexion to right hand and then individual finger flexion to left hand . Gentle soft tissue and joint mobs/ distraction to digits, hand, wrist bilaterally as welll as right foream, pt reports feeling looser Resistive gripping with  tree squeeze ball left and right UE's digi flex, light resistance for composite and individual finger flexion for LUE   PATIENT EDUCATION: Education details:   see above Person educated: Patient Education method: Explanation, demonstration, v.c Education comprehension: verbalized understanding, returned demonstration  HOME EXERCISE PROGRAM: AROM yellow putty  GOALS: Goals reviewed with patient? Yes  SHORT TERM GOALS: Target date: 09/14/23  I with HEP  Goal status:  met 06/07/23  2.  I with positioning/ splinting prn to minimize pain and defomity   Goal status: met, 07/13/23  3.  Pt will increase bilateral grip strength by 5 lbs for increased UE functional use. Upgraded goal Pt will increase bilateral grip strength to at least 25 lbs (after stretching). Baseline: 05/30/23- RUE 8 lbs, LUE 4 lbs Goal status:met 08/01/23-R 20 lbs, L  19 lbs,  08/15/23- RUE 18 lbsLUE 14 lbs continue to address upgraded goal  - RUE 19 lbs, LUE 24- 10/03/23  RUE 23 lbs LUE 25 lbs, met for LUE, ongoing for right. 10/17/23  4. Pt will demonstrate improved RUE fine motor coordination as evidenced by perfroming 9 hole peg test in 23 secs or less.  Goal status: ongoing 10/17/23, ongoing 26.68- 10/24/23 5. I with updates to HEP.  Goal status: ongoing 10/17/23 6. Pt will demonstrate improved composite finger flexion for ADLs as evidenced by pt. bringing middle fingertip for RUE  within 1/2 an inch of palm.  Baseline: 3/4 inch from palm to middle fingertip  Goal status : ongoing, 3/4 inch 09/19/23, 1 inch from palm 10/24/23 7.Pt will demonstrate improved composite finger flexion for ADLs as evidenced by pt. bringing middle fingertip for LUE within 3/4 an inch of palm.  Baseline: 1 inch from palm to middle finger tip  Goal status: met, 3/4 inch away from middle finger 09/19/23  LONG TERM GOALS: Target date:11/07/23  I with updated HEP  Goal status:met 08/15/22  2.  Pt will improve Quick Dash score to 70% disability Baseline: 75% (05/30/23) Goal status:   improved - however not fully met due to complications from shoulder pain-70.5%08/15/23  3.  Pt will demonstrate at least 65% composte flexion for LUE for increased functional use.  Goal status: met 65-70% , 08/15/23  4.  I with adapted strategies/ adapted equipment to minimize pain and to increase pt I with ADLs/IADLs  Goal status:  met for inital strategies, however continue goal as pt may report additional tasks that can benefit from modification.ongoing 10/03/23  5.  Pt will demonstrate at least 65% composite flexion for RUE for increased functional use.  Goal status: met 70% 08/15/23  6. Pt will report that she is able to cut her meat such as steak or chicken modified independently.  [place]: unable  Goal status: met 09/21/23    7. Pt will report increased ease with opening/ closing ziplock bags.    Goal status: met 09/21/23    8. Pt will improve bilateral tip pinch by 2 lbs for increased ease with daily activities.     Goal status: ongoing 09/19/23    9. Pt will improve bilateral lateral pinch by 2 lbs for increased ease with ADLs.    Goal status: ongoing 09/19/23   ASSESSMENT: CLINICAL IMPRESSION: Pt reports improved ability to use hands functionally for meal prep during VBS. Today hand pain is 4/10 which is improved overall. PERFORMANCE DEFICITS: in functional skills including ADLs, IADLs, coordination,  sensation, edema, ROM, strength, pain, flexibility, Fine motor control, Gross motor control, endurance, decreased knowledge of precautions, decreased knowledge of use of DME, and UE functional use,, and psychosocial skills including coping strategies, environmental adaptation, habits, interpersonal interactions, and routines and behaviors.   IMPAIRMENTS: are limiting patient from ADLs, IADLs, rest and sleep, education, play, leisure, and social participation.   COMORBIDITIES: may have co-morbidities  that affects occupational performance. Patient will benefit from skilled OT to address above impairments and improve overall function.  MODIFICATION OR ASSISTANCE TO COMPLETE EVALUATION: No modification of tasks or assist necessary to complete an evaluation.  OT OCCUPATIONAL PROFILE AND HISTORY: Detailed assessment: Review of records and additional review of physical, cognitive, psychosocial history related to current functional performance.  CLINICAL DECISION MAKING: LOW - limited treatment options, no task modification necessary  REHAB POTENTIAL: Good  EVALUATION COMPLEXITY: Low      PLAN:  OT FREQUENCY: 2x/week  OT DURATION: 12 weeks( anticipate d/c after 8 weeks dependent on progress)  PLANNED INTERVENTIONS: 02831 OT Re-evaluation, 97535 self care/ADL training, 02889 therapeutic exercise, 97530 therapeutic activity, 97112 neuromuscular re-education, 97140 manual therapy, 97113 aquatic therapy, 97035 ultrasound, 97018 paraffin, 02960 fluidotherapy, 97010 moist heat, 97010 cryotherapy, 97034 contrast bath, 97760 Orthotics management and training, 02239 Splinting (initial encounter), [id number] Subsequent splinting/medication, passive range of motion, energy conservation, coping strategies training, patient/family education, and DME and/or AE instructions  RECOMMENDED OTHER SERVICES: none  CONSULTED AND AGREED WITH PLAN OF CARE: Patient  PLAN  FOR NEXT SESSION:   functional use and ROM for  bilateral hands   Aseneth Hack, OT 12/06/2023, 5:55 PM                     OUTPATIENT OCCUPATIONAL THERAPY ORTHO Treatment  Patient Name: Veronica Galvan MRN: [id number] DOB:[birth year], 68 y.o., female Today's Date: 12/06/2023  PCP: Leita Eliza Elbe [name] REFERRING PROVIDER: Leita Eliza Elbe [name]  END OF SESSION:  OT End of Session - 12/06/23 1427     Visit Number 33    Number of Visits 35    Date for OT Re-Evaluation 12/13/23    Authorization Type [employer] MCR    Authorization Time Period 12 weeks    Authorization - Visit Number 33    Progress Note Due on Visit 40    OT Start Time 1401    OT Stop Time 1445    OT Time Calculation (min) 44 min    Activity Tolerance Patient tolerated treatment well    Behavior During Therapy WFL for tasks assessed/performed                            Past Medical History:  Diagnosis Date   Allergy     Anxiety    Arthritis    hands   Cataract    Diabetes mellitus without complication (HCC)    type 2   Family history of adverse reaction to anesthesia    son has malignant hyperthermia, daughter does not daughter recently had c section without problems   GERD (gastroesophageal reflux disease)    Headache    sinus   Hyperlipidemia    Hypertension    PONV (postoperative nausea and vomiting)    nausea only   Ulcer, stomach peptic yrs ago   Past Surgical History:  Procedure Laterality Date   APPENDECTOMY     both hells bone spur repair     both heels with metal clips   both shoulder rotator cuff repair     CESAREAN SECTION     x 1   COLONOSCOPY WITH PROPOFOL  N/A 09/07/2016   Procedure: COLONOSCOPY WITH PROPOFOL ;  Surgeon: Vicci Gladis POUR, MD;  Location: [facility];  Service: Endoscopy;  Laterality: N/A;   colonscopy  06/2011   polyps   ELBOW FRACTURE SURGERY Left    EYE SURGERY     FRACTURE SURGERY     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/10/2022   Procedure: REVERSE SHOULDER  ARTHROPLASTY;  Surgeon: Melita Drivers, MD;  Location: [facility];  Service: Orthopedics;  Laterality: Right;  Please follow in room 6 if able   TUBAL LIGATION     VESICO-VAGINAL FISTULA REPAIR     Patient Active Problem List   Diagnosis Date Noted   Gastroesophageal reflux disease 11/16/2021   Asymptomatic varicose veins of bilateral lower extremities 08/18/2021   Dermatofibroma 08/18/2021   History of malignant neoplasm of skin 08/18/2021   Lentigo 08/18/2021   Melanocytic nevi of trunk 08/18/2021   Nevus lipomatosus cutaneous superficialis 08/18/2021   Rosacea 08/18/2021   Actinic keratosis 08/18/2021   Sensorineural hearing loss (SNHL) of left ear with unrestricted hearing of right ear 07/26/2021   Tinnitus of left ear 07/26/2021   Acute recurrent maxillary sinusitis 06/22/2021   Primary hypertension 01/12/2021   Hyperlipidemia associated with type 2 diabetes mellitus (HCC) 01/12/2021   Arthritis 01/12/2021   Type II diabetes mellitus (HCC) 11/25/2019   Ingrown toenail 09/05/2018   Neck  pain 01/29/2018   Tick bite 10/24/2017    ONSET DATE: 05/19/23  REFERRING DIAG:  Diagnosis  M79.641,M79.642 (ICD-10-CM) - Bilateral hand pain    THERAPY DIAG:  Muscle weakness (generalized)  Stiffness of left hand, not elsewhere classified  Stiffness of right hand, not elsewhere classified  Pain in right hand  Pain in left hand  Other lack of coordination  Rationale for Evaluation and Treatment: Rehabilitation  SUBJECTIVE:   SUBJECTIVE STATEMENT: Pt reports  her hands felt looser after last OT session Pt accompanied by: self  PERTINENT HISTORY: 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 Yes: NPRS scale: 7/10- right hand Pain location:  right hand,  3/10 left hand Pain description: aching, stiff Aggravating factors: gripping  Relieving factors: heat, meds Pt also has shoulder pain which is more significant grossly R shoulder 8/10 L shoulder pain 3/10 which OT will not address. PT is addressing shoulder FALLS: Has patient fallen in last 6 months? No  LIVING ENVIRONMENT: Lives with: lives alone Lives in: House/apartment Stairs: yes   PLOF: Independent  PATIENT GOALS: improve functional use of hands and learn adapted strategies for ADLs/IADLs.  NEXT MD VISIT: unknown  OBJECTIVE:  Note: Objective measures were completed at Evaluation unless otherwise noted.  HAND DOMINANCE: Right  ADLs: Overall ADLs: unable to grip credit card to remove from ATM Transfers/ambulation related to ADLs: mod I Eating: drops silverware Grooming: drops toothbrush, uses electric toothbrush, holding comb Upper body dressing: adjusting bra Lower body dressing: difficulty pulling up pants  Toileting: hygeine was difficult initally Bathing: mod I, difficult with shaving Tub shower transfers: walk in shower    FUNCTIONAL OUTCOME MEASURES: Quick Dash: 05/30/23- 75%% disability Quick Dash 08/15/23- 70.5% disability   UPPER EXTREMITY ROM:   RUE wrist flexion/ extension: 70/ 50, LUE wrist flexion/ extension: 70/ 45 Pt demonstrates grossly 50% composite flexion for right and 555 with left.   Active ROM Right eval Left eval  Thumb MCP (0-60) 45 55  Thumb IP (0-80) 25 10  Thumb Radial abd/add (0-55)     Thumb Palmar abd/add (0-45)     Thumb Opposition to Small Finger yes yes   Index MCP (0-90)     Index PIP (0-100)     Index DIP (0-70)      Long MCP (0-90)      Long PIP (0-100)      Long DIP (0-70)      Ring MCP (0-90)      Ring PIP (0-100)      Ring DIP (0-70)      Little MCP (0-90)      Little PIP (0-100)      Little DIP (0-70)      (Blank rows = not tested) 9 hole peg test (08/15/23) RUE 26.70 secs, LUE 23 secs  HAND  FUNCTION: Grip strength: Right: 8 lbs; Left: 4 lbs,          08/15/23-  RUE 18, LUE 14 lbs,           08/15/23   Pinch: RUE tip 4lbs, lateral 8 lbs, LUE tip 3 lbs, lateral 8 lbs   SENSATION: Light touch: Impaired index finger LUE  EDEMA: Pt with bony defomities at PIP joints of all digits which pt reports are bone  spurs, Pt also has bony deformity at DIP for right thumb, index and 5th digit and LUE small and index fingers  COGNITION: Overall cognitive status: Within functional limits for tasks assessed   OBSERVATIONS: Pleasant female desires to increase functional use of bilateral UE's  , TREATMENT DATE:Paraffin to RUE x 10 mins, no adverse reactions, while therapist performed US  to left hand 3.3 mhz, 0.8 w/cm 2,  20%x 8 mins no adverse reactions for stiffness. Paraffin to LUE x 10 mins, no adverse reactions, while therapist performed US  to right hand 3.3 mhz, 0.8 w/cm 2,  20%x 8 mins no adverse reactions for stiffness.  Gentle soft tissue and joint mobs/ distraction to digits, hand, wrist bilaterally as welll as right forearm, pt reports  hands and wrists are feeling looser at end of session. Passive composite finger flexion to right hand and then individual finger flexion to left hand . Therapist checked grip strength:  RUE 23 lbs LUE 25 lbs, pt demonstrates bilateral improvements and she reports continuing improving functional use.   10/03/23 Paraffin to RUE x 10 mins, no adverse reactions, while therapist performed US  to left hand 3.3 mhz, 0.8 w/cm 2,  20%x 8 mins no adverse reactions for stiffness. Paraffin to LUE x 10 mins, no adverse reactions, while therapist performed US  to right hand 3.3 mhz, 0.8 w/cm 2,  20%x 8 mins no adverse reactions for stiffness.  Passive composite finger flexion to right hand and then individual finger flexion to left hand . Gentle soft tissue and joint mobs/ distraction to digits, hand, wrist bilaterally as welll as right foream, pt reports feeling  looser Resistive gripping with  tree squeeze ball left and right UE's digi flex, light resistance for composite and individual finger flexion for LUE   PATIENT EDUCATION: Education details:   see above Person educated: Patient Education method: Explanation, demonstration, v.c Education comprehension: verbalized understanding, returned demonstration  HOME EXERCISE PROGRAM: AROM yellow putty  GOALS: Goals reviewed with patient? Yes  SHORT TERM GOALS: Target date: 09/14/23  I with HEP  Goal status:  met 06/07/23  2.  I with positioning/ splinting prn to minimize pain and defomity   Goal status: met, 07/13/23  3.  Pt will increase bilateral grip strength by 5 lbs for increased UE functional use. Upgraded goal Pt will increase bilateral grip strength to at least 25 lbs (after stretching). Baseline: 05/30/23- RUE 8 lbs, LUE 4 lbs Goal status:met 08/01/23-R 20 lbs, L  19 lbs,  08/15/23- RUE 18 lbsLUE 14 lbs continue to address upgraded goal  - RUE 19 lbs, LUE 24- 10/03/23  RUE 23 lbs LUE 25 lbs, met for LUE, ongoing for right. 10/17/23  4. Pt will demonstrate improved RUE fine motor coordination as evidenced by perfroming 9 hole peg test in 23 secs or less.  Goal status: ongoing 10/17/23 5. I with updates to HEP.  Goal status: ongoing 10/17/23 6. Pt will demonstrate improved composite finger flexion for ADLs as evidenced by pt. bringing middle fingertip for RUE within 1/2 an inch of palm.  Baseline: 3/4 inch from palm to middle fingertip  Goal status : ongoing, 3/4 inch 09/19/23 7.Pt will demonstrate improved composite finger flexion for ADLs as evidenced by pt. bringing middle fingertip for LUE within 3/4 an inch of palm.  Baseline: 1 inch from palm to middle finger tip  Goal status: met, 3/4 inch away from middle finger 09/19/23  LONG TERM GOALS: Target date:11/07/23  I with updated HEP  Goal status:met 08/15/22  2.  Pt will improve Quick Dash score to 70% disability Baseline: 75%  (05/30/23) Goal status:   improved - however not fully met due to complications from shoulder pain-70.5%08/15/23  3.  Pt will demonstrate at least 65% composte flexion for LUE for increased functional use.  Goal status: met 65-70% , 08/15/23  4.  I with adapted strategies/ adapted equipment to minimize pain and to increase pt I with ADLs/IADLs  Goal status:  met for inital strategies, however continue goal as pt may report additional tasks that can benefit from modification.ongoing 10/03/23  5.  Pt will demonstrate at least 65% composite flexion for RUE for increased functional use.  Goal status: met 70% 08/15/23  6. Pt will report that she is able to cut her meat such as steak or chicken modified independently.  [place]: unable  Goal status: met 09/21/23    7. Pt will report increased ease with opening/ closing ziplock bags.    Goal status: met 09/21/23    8. Pt will improve bilateral tip pinch by 2 lbs for increased ease with daily activities.     Goal status: ongoing 09/19/23    9. Pt will improve bilateral lateral pinch by 2 lbs for increased ease with ADLs.    Goal status: ongoing 09/19/23   ASSESSMENT: CLINICAL IMPRESSION: Pt demonstrates improving flexibility and grip strength.PERFORMANCE DEFICITS: in functional skills including ADLs, IADLs, coordination, sensation, edema, ROM, strength, pain, flexibility, Fine motor control, Gross motor control, endurance, decreased knowledge of precautions, decreased knowledge of use of DME, and UE functional use,, and psychosocial skills including coping strategies, environmental adaptation, habits, interpersonal interactions, and routines and behaviors.   IMPAIRMENTS: are limiting patient from ADLs, IADLs, rest and sleep, education, play, leisure, and social participation.   COMORBIDITIES: may have co-morbidities  that affects occupational performance. Patient will benefit from skilled OT to address above impairments and improve overall  function.  MODIFICATION OR ASSISTANCE TO COMPLETE EVALUATION: No modification of tasks or assist necessary to complete an evaluation.  OT OCCUPATIONAL PROFILE AND HISTORY: Detailed assessment: Review of records and additional review of physical, cognitive, psychosocial history related to current functional performance.  CLINICAL DECISION MAKING: LOW - limited treatment options, no task modification necessary  REHAB POTENTIAL: Good  EVALUATION COMPLEXITY: Low      PLAN:  OT FREQUENCY: 2x/week  OT DURATION: 12 weeks( anticipate d/c after 8 weeks dependent on progress)  PLANNED INTERVENTIONS: 02831 OT Re-evaluation, 97535 self care/ADL training, 02889 therapeutic exercise, 97530 therapeutic activity, 97112 neuromuscular re-education, 97140 manual therapy, 97113 aquatic therapy, 97035 ultrasound, 97018 paraffin, 02960 fluidotherapy, 97010 moist heat, 97010 cryotherapy, 97034 contrast bath, 97760 Orthotics management and training, 02239 Splinting (initial encounter), [id number] Subsequent splinting/medication, passive range of motion, energy conservation, coping strategies training, patient/family education, and DME and/or AE instructions  RECOMMENDED OTHER SERVICES: none  CONSULTED AND AGREED WITH PLAN OF CARE: Patient  PLAN FOR NEXT SESSION:   check progress towards goals and and plan to renew.   Venba Zenner, OT 12/06/2023, 5:55 PM                     OUTPATIENT OCCUPATIONAL THERAPY ORTHO Treatment  Patient Name: Veronica Galvan MRN: [id number] DOB:[birth year], 68 y.o., female Today's Date: 12/06/2023  PCP: Leita Eliza Elbe [name] REFERRING PROVIDER: Leita Eliza Elbe [name]  END OF SESSION:  OT End of Session - 12/06/23 1427     Visit Number 33    Number of Visits 35    Date for  OT Re-Evaluation 12/13/23    Authorization Type [employer] MCR    Authorization Time Period 12 weeks    Authorization - Visit Number 33    Progress Note Due on Visit 40    OT Start  Time 1401    OT Stop Time 1445    OT Time Calculation (min) 44 min    Activity Tolerance Patient tolerated treatment well    Behavior During Therapy WFL for tasks assessed/performed                            Past Medical History:  Diagnosis Date   Allergy     Anxiety    Arthritis    hands   Cataract    Diabetes mellitus without complication (HCC)    type 2   Family history of adverse reaction to anesthesia    son has malignant hyperthermia, daughter does not daughter recently had c section without problems   GERD (gastroesophageal reflux disease)    Headache    sinus   Hyperlipidemia    Hypertension    PONV (postoperative nausea and vomiting)    nausea only   Ulcer, stomach peptic yrs ago   Past Surgical History:  Procedure Laterality Date   APPENDECTOMY     both hells bone spur repair     both heels with metal clips   both shoulder rotator cuff repair     CESAREAN SECTION     x 1   COLONOSCOPY WITH PROPOFOL  N/A 09/07/2016   Procedure: COLONOSCOPY WITH PROPOFOL ;  Surgeon: Vicci Gladis POUR, MD;  Location: [facility];  Service: Endoscopy;  Laterality: N/A;   colonscopy  06/2011   polyps   ELBOW FRACTURE SURGERY Left    EYE SURGERY     FRACTURE SURGERY     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/10/2022   Procedure: REVERSE SHOULDER ARTHROPLASTY;  Surgeon: Melita Drivers, MD;  Location: [facility];  Service: Orthopedics;  Laterality: Right;  Please follow in room 6 if able   TUBAL LIGATION     VESICO-VAGINAL FISTULA REPAIR     Patient Active Problem List   Diagnosis Date Noted   Gastroesophageal reflux disease 11/16/2021   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 08/18/2021   Dermatofibroma 08/18/2021   History of malignant neoplasm of skin 08/18/2021   Lentigo 08/18/2021   Melanocytic nevi of trunk 08/18/2021   Nevus lipomatosus cutaneous superficialis 08/18/2021   Rosacea 08/18/2021   Actinic keratosis 08/18/2021    Sensorineural hearing loss (SNHL) of left ear with unrestricted hearing of right ear 07/26/2021   Tinnitus of left ear 07/26/2021   Acute recurrent maxillary sinusitis 06/22/2021   Primary hypertension 01/12/2021   Hyperlipidemia associated with type 2 diabetes mellitus (HCC) 01/12/2021   Arthritis 01/12/2021   Type II diabetes mellitus (HCC) 11/25/2019   Ingrown toenail 09/05/2018   Neck pain 01/29/2018   Tick bite 10/24/2017    ONSET DATE: 05/19/23  REFERRING DIAG:  Diagnosis  M79.641,M79.642 (ICD-10-CM) - Bilateral hand pain    THERAPY DIAG:  Muscle weakness (generalized)  Stiffness of left hand, not elsewhere classified  Stiffness of right hand, not elsewhere classified  Pain in right hand  Pain in left hand  Other lack of coordination  Rationale for Evaluation and Treatment: Rehabilitation  SUBJECTIVE:   SUBJECTIVE STATEMENT: Pt reports  her hands felt looser after last OT session Pt accompanied by: self  PERTINENT HISTORY: S/P right reverse total shoulder arthroplasty  11/10/23- Dr. 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 Yes: NPRS scale: 4/10 Pain location: right hand,  left hand Pain description: aching, stiff Aggravating factors: gripping  Relieving factors: heat, meds Pt also has shoulder pain which is more significant grossly R shoulder 7/10 L shoulder pain 3/10 which OT will not address. PT is addressing shoulder FALLS: Has patient fallen in last 6 months? No  LIVING ENVIRONMENT: Lives with: lives alone Lives in: House/apartment Stairs: yes   PLOF: Independent  PATIENT GOALS: improve functional use of hands and learn adapted strategies for ADLs/IADLs.  NEXT MD VISIT: unknown  OBJECTIVE:  Note: Objective measures were completed at Evaluation unless otherwise  noted.  HAND DOMINANCE: Right  ADLs: Overall ADLs: unable to grip credit card to remove from ATM Transfers/ambulation related to ADLs: mod I Eating: drops silverware Grooming: drops toothbrush, uses electric toothbrush, holding comb Upper body dressing: adjusting bra Lower body dressing: difficulty pulling up pants  Toileting: hygeine was difficult initally Bathing: mod I, difficult with shaving Tub shower transfers: walk in shower    FUNCTIONAL OUTCOME MEASURES: Quick Dash: 05/30/23- 75%% disability Quick Dash 08/15/23- 70.5% disability   UPPER EXTREMITY ROM:   RUE wrist flexion/ extension: 70/ 50, LUE wrist flexion/ extension: 70/ 45 Pt demonstrates grossly 50% composite flexion for right and 555 with left.   Active ROM Right eval Left eval  Thumb MCP (0-60) 45 55  Thumb IP (0-80) 25 10  Thumb Radial abd/add (0-55)     Thumb Palmar abd/add (0-45)     Thumb Opposition to Small Finger yes yes   Index MCP (0-90)     Index PIP (0-100)     Index DIP (0-70)      Long MCP (0-90)      Long PIP (0-100)      Long DIP (0-70)      Ring MCP (0-90)      Ring PIP (0-100)      Ring DIP (0-70)      Little MCP (0-90)      Little PIP (0-100)      Little DIP (0-70)      (Blank rows = not tested) 9 hole peg test (08/15/23) RUE 26.70 secs, LUE 23 secs  HAND FUNCTION: Grip strength: Right: 8 lbs; Left: 4 lbs,          08/15/23-  RUE 18, LUE 14 lbs,           08/15/23   Pinch: RUE tip 4lbs, lateral 8 lbs, LUE tip 3 lbs, lateral 8 lbs   SENSATION: Light touch: Impaired index finger LUE  EDEMA: Pt with bony defomities at PIP joints of all digits which pt reports are bone spurs, Pt also has bony deformity at DIP for right thumb, index and 5th digit and LUE small and index fingers  COGNITION: Overall cognitive status: Within functional limits for tasks assessed   OBSERVATIONS: Pleasant female desires to increase functional use of bilateral UE's  , TREATMENT DATE:10/24/23-Paraffin to  RUE x 10 mins, no adverse reactions, while therapist performed US  to left hand 3.3 mhz, 0.8 w/cm 2,  20%x 8 mins no adverse reactions for stiffness. Paraffin to LUE x 10 mins, no adverse reactions, while therapist performed US  to right hand 3.3 mhz, 0.8 w/cm 2,  20%x 8 mins no adverse  reactions for stiffness.  Gentle soft tissue and joint mobs/ distraction to digits, hand, wrist bilaterally as welll as right forearm,  Passive finger flexion to bilateral hands. Therpist checked 9 hole peg test, see goals. 10/17/23-Paraffin to RUE x 10 mins, no adverse reactions, while therapist performed US  to left hand 3.3 mhz, 0.8 w/cm 2,  20%x 8 mins no adverse reactions for stiffness. Paraffin to LUE x 10 mins, no adverse reactions, while therapist performed US  to right hand 3.3 mhz, 0.8 w/cm 2,  20%x 8 mins no adverse reactions for stiffness.  Gentle soft tissue and joint mobs/ distraction to digits, hand, wrist bilaterally as welll as right forearm, pt reports  hands and wrists are feeling looser at end of session. Passive composite finger flexion to right hand and then individual finger flexion to left hand . Therapist checked grip strength:  RUE 23 lbs LUE 25 lbs, pt demonstrates bilateral improvements and she reports continuing improving functional use.   10/03/23 Paraffin to RUE x 10 mins, no adverse reactions, while therapist performed US  to left hand 3.3 mhz, 0.8 w/cm 2,  20%x 8 mins no adverse reactions for stiffness. Paraffin to LUE x 10 mins, no adverse reactions, while therapist performed US  to right hand 3.3 mhz, 0.8 w/cm 2,  20%x 8 mins no adverse reactions for stiffness.  Passive composite finger flexion to right hand and then individual finger flexion to left hand . Gentle soft tissue and joint mobs/ distraction to digits, hand, wrist bilaterally as welll as right foream, pt reports feeling looser Resistive gripping with  tree squeeze ball left and right UE's digi flex, light resistance for composite  and individual finger flexion for LUE   PATIENT EDUCATION: Education details:   see above Person educated: Patient Education method: Explanation, demonstration, v.c Education comprehension: verbalized understanding, returned demonstration  HOME EXERCISE PROGRAM: AROM yellow putty  GOALS: Goals reviewed with patient? Yes  SHORT TERM GOALS: Target date: 09/14/23  I with HEP  Goal status:  met 06/07/23  2.  I with positioning/ splinting prn to minimize pain and defomity   Goal status: met, 07/13/23  3.  Pt will increase bilateral grip strength by 5 lbs for increased UE functional use. Upgraded goal Pt will increase bilateral grip strength to at least 25 lbs (after stretching). Baseline: 05/30/23- RUE 8 lbs, LUE 4 lbs Goal status:met 08/01/23-R 20 lbs, L  19 lbs,  08/15/23- RUE 18 lbsLUE 14 lbs continue to address upgraded goal  - RUE 19 lbs, LUE 24- 10/03/23  RUE 23 lbs LUE 25 lbs, met for LUE, ongoing for right. 10/17/23  4. Pt will demonstrate improved RUE fine motor coordination as evidenced by perfroming 9 hole peg test in 23 secs or less.  Goal status: ongoing 10/17/23, ongoing 26.68- 10/24/23 5. I with updates to HEP.  Goal status: ongoing 10/17/23 6. Pt will demonstrate improved composite finger flexion for ADLs as evidenced by pt. bringing middle fingertip for RUE within 1/2 an inch of palm.  Baseline: 3/4 inch from palm to middle fingertip  Goal status : ongoing, 3/4 inch 09/19/23, 1 inch from palm 10/24/23 7.Pt will demonstrate improved composite finger flexion for ADLs as evidenced by pt. bringing middle fingertip for LUE within 3/4 an inch of palm.  Baseline: 1 inch from palm to middle finger tip  Goal status: met, 3/4 inch away from middle finger 09/19/23  LONG TERM GOALS: Target date:11/07/23  I with updated HEP  Goal status:met 08/15/22  2.  Pt  will improve Quick Dash score to 70% disability Baseline: 75% (05/30/23) Goal status:   improved - however not fully met due to  complications from shoulder pain-70.5%08/15/23  3.  Pt will demonstrate at least 65% composte flexion for LUE for increased functional use.  Goal status: met 65-70% , 08/15/23  4.  I with adapted strategies/ adapted equipment to minimize pain and to increase pt I with ADLs/IADLs  Goal status:  met for inital strategies, however continue goal as pt may report additional tasks that can benefit from modification.ongoing 10/03/23  5.  Pt will demonstrate at least 65% composite flexion for RUE for increased functional use.  Goal status: met 70% 08/15/23  6. Pt will report that she is able to cut her meat such as steak or chicken modified independently.  [place]: unable  Goal status: met 09/21/23    7. Pt will report increased ease with opening/ closing ziplock bags.    Goal status: met 09/21/23    8. Pt will improve bilateral tip pinch by 2 lbs for increased ease with daily activities.     Goal status: ongoing 09/19/23    9. Pt will improve bilateral lateral pinch by 2 lbs for increased ease with ADLs.    Goal status: ongoing 09/19/23   ASSESSMENT: CLINICAL IMPRESSION: Pt reports improved ability to use hands functionally for meal prep during VBS. Today hand pain is 4/10 which is improved overall. PERFORMANCE DEFICITS: in functional skills including ADLs, IADLs, coordination, sensation, edema, ROM, strength, pain, flexibility, Fine motor control, Gross motor control, endurance, decreased knowledge of precautions, decreased knowledge of use of DME, and UE functional use,, and psychosocial skills including coping strategies, environmental adaptation, habits, interpersonal interactions, and routines and behaviors.   IMPAIRMENTS: are limiting patient from ADLs, IADLs, rest and sleep, education, play, leisure, and social participation.   COMORBIDITIES: may have co-morbidities  that affects occupational performance. Patient will benefit from skilled OT to address above impairments and improve overall  function.  MODIFICATION OR ASSISTANCE TO COMPLETE EVALUATION: No modification of tasks or assist necessary to complete an evaluation.  OT OCCUPATIONAL PROFILE AND HISTORY: Detailed assessment: Review of records and additional review of physical, cognitive, psychosocial history related to current functional performance.  CLINICAL DECISION MAKING: LOW - limited treatment options, no task modification necessary  REHAB POTENTIAL: Good  EVALUATION COMPLEXITY: Low      PLAN:  OT FREQUENCY: 2x/week  OT DURATION: 12 weeks( anticipate d/c after 8 weeks dependent on progress)  PLANNED INTERVENTIONS: 02831 OT Re-evaluation, 97535 self care/ADL training, 02889 therapeutic exercise, 97530 therapeutic activity, 97112 neuromuscular re-education, 97140 manual therapy, 97113 aquatic therapy, 97035 ultrasound, 97018 paraffin, 02960 fluidotherapy, 97010 moist heat, 97010 cryotherapy, 97034 contrast bath, 97760 Orthotics management and training, 02239 Splinting (initial encounter), [id number] Subsequent splinting/medication, passive range of motion, energy conservation, coping strategies training, patient/family education, and DME and/or AE instructions  RECOMMENDED OTHER SERVICES: none  CONSULTED AND AGREED WITH PLAN OF CARE: Patient  PLAN FOR NEXT SESSION:   functional use and ROM for bilateral hands   Sears Oran, OT 12/06/2023, 5:55 PM                     OUTPATIENT OCCUPATIONAL THERAPY ORTHO Treatment  Patient Name: Veronica Galvan MRN: [id number] DOB:1955-06-06, 68 y.o., female Today's Date: 12/06/2023  PCP: Leita Eliza Elbe [name] REFERRING PROVIDER: Leita Eliza Elbe [name]  END OF SESSION:  OT End of Session - 12/06/23 1427     Visit Number 33  Number of Visits 35    Date for OT Re-Evaluation 12/13/23    Authorization Type [employer] MCR    Authorization Time Period 12 weeks    Authorization - Visit Number 33    Progress Note Due on Visit 40    OT Start Time 1401     OT Stop Time 1445    OT Time Calculation (min) 44 min    Activity Tolerance Patient tolerated treatment well    Behavior During Therapy WFL for tasks assessed/performed                            Past Medical History:  Diagnosis Date   Allergy     Anxiety    Arthritis    hands   Cataract    Diabetes mellitus without complication (HCC)    type 2   Family history of adverse reaction to anesthesia    son has malignant hyperthermia, daughter does not daughter recently had c section without problems   GERD (gastroesophageal reflux disease)    Headache    sinus   Hyperlipidemia    Hypertension    PONV (postoperative nausea and vomiting)    nausea only   Ulcer, stomach peptic yrs ago   Past Surgical History:  Procedure Laterality Date   APPENDECTOMY     both hells bone spur repair     both heels with metal clips   both shoulder rotator cuff repair     CESAREAN SECTION     x 1   COLONOSCOPY WITH PROPOFOL  N/A 09/07/2016   Procedure: COLONOSCOPY WITH PROPOFOL ;  Surgeon: Vicci Gladis POUR, MD;  Location: [facility];  Service: Endoscopy;  Laterality: N/A;   colonscopy  06/2011   polyps   ELBOW FRACTURE SURGERY Left    EYE SURGERY     FRACTURE SURGERY     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/10/2022   Procedure: REVERSE SHOULDER ARTHROPLASTY;  Surgeon: Melita Drivers, MD;  Location: [facility];  Service: Orthopedics;  Laterality: Right;  Please follow in room 6 if able   TUBAL LIGATION     VESICO-VAGINAL FISTULA REPAIR     Patient Active Problem List   Diagnosis Date Noted   Gastroesophageal reflux disease 11/16/2021   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 08/18/2021   Dermatofibroma 08/18/2021   History of malignant neoplasm of skin 08/18/2021   Lentigo 08/18/2021   Melanocytic nevi of trunk 08/18/2021   Nevus lipomatosus cutaneous superficialis 08/18/2021   Rosacea 08/18/2021   Actinic keratosis 08/18/2021    Sensorineural hearing loss (SNHL) of left ear with unrestricted hearing of right ear 07/26/2021   Tinnitus of left ear 07/26/2021   Acute recurrent maxillary sinusitis 06/22/2021   Primary hypertension 01/12/2021   Hyperlipidemia associated with type 2 diabetes mellitus (HCC) 01/12/2021   Arthritis 01/12/2021   Type II diabetes mellitus (HCC) 11/25/2019   Ingrown toenail 09/05/2018   Neck pain 01/29/2018   Tick bite 10/24/2017    ONSET DATE: 05/19/23  REFERRING DIAG:  Diagnosis  M79.641,M79.642 (ICD-10-CM) - Bilateral hand pain    THERAPY DIAG:  Muscle weakness (generalized)  Stiffness of left hand, not elsewhere classified  Stiffness of right hand, not elsewhere classified  Pain in right hand  Pain in left hand  Other lack of coordination  Rationale for Evaluation and Treatment: Rehabilitation  SUBJECTIVE:   SUBJECTIVE STATEMENT: Pt reports  her hands felt looser after last OT session Pt accompanied by: self  PERTINENT HISTORY: 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 Yes: NPRS scale: 7/10- right hand Pain location: right hand,  3/10 left hand Pain description: aching, stiff Aggravating factors: gripping  Relieving factors: heat, meds Pt also has shoulder pain which is more significant grossly R shoulder 8/10 L shoulder pain 3/10 which OT will not address. PT is addressing shoulder FALLS: Has patient fallen in last 6 months? No  LIVING ENVIRONMENT: Lives with: lives alone Lives in: House/apartment Stairs: yes   PLOF: Independent  PATIENT GOALS: improve functional use of hands and learn adapted strategies for ADLs/IADLs.  NEXT MD VISIT: unknown  OBJECTIVE:  Note: Objective measures were completed at Evaluation unless  otherwise noted.  HAND DOMINANCE: Right  ADLs: Overall ADLs: unable to grip credit card to remove from ATM Transfers/ambulation related to ADLs: mod I Eating: drops silverware Grooming: drops toothbrush, uses electric toothbrush, holding comb Upper body dressing: adjusting bra Lower body dressing: difficulty pulling up pants  Toileting: hygeine was difficult initally Bathing: mod I, difficult with shaving Tub shower transfers: walk in shower    FUNCTIONAL OUTCOME MEASURES: Quick Dash: 05/30/23- 75%% disability Quick Dash 08/15/23- 70.5% disability   UPPER EXTREMITY ROM:   RUE wrist flexion/ extension: 70/ 50, LUE wrist flexion/ extension: 70/ 45 Pt demonstrates grossly 50% composite flexion for right and 555 with left.   Active ROM Right eval Left eval  Thumb MCP (0-60) 45 55  Thumb IP (0-80) 25 10  Thumb Radial abd/add (0-55)     Thumb Palmar abd/add (0-45)     Thumb Opposition to Small Finger yes yes   Index MCP (0-90)     Index PIP (0-100)     Index DIP (0-70)      Long MCP (0-90)      Long PIP (0-100)      Long DIP (0-70)      Ring MCP (0-90)      Ring PIP (0-100)      Ring DIP (0-70)      Little MCP (0-90)      Little PIP (0-100)      Little DIP (0-70)      (Blank rows = not tested) 9 hole peg test (08/15/23) RUE 26.70 secs, LUE 23 secs  HAND FUNCTION: Grip strength: Right: 8 lbs; Left: 4 lbs,          08/15/23-  RUE 18, LUE 14 lbs,           08/15/23   Pinch: RUE tip 4lbs, lateral 8 lbs, LUE tip 3 lbs, lateral 8 lbs   SENSATION: Light touch: Impaired index finger LUE  EDEMA: Pt with bony defomities at PIP joints of all digits which pt reports are bone spurs, Pt also has bony deformity at DIP for right thumb, index and 5th digit and LUE small and index fingers  COGNITION: Overall cognitive status: Within functional limits for tasks assessed   OBSERVATIONS: Pleasant female desires to increase functional use of bilateral UE's  , TREATMENT DATE:Paraffin  to RUE x 10 mins, no adverse reactions, while therapist performed US  to left hand 3.3 mhz, 0.8 w/cm 2,  20%x 8 mins no adverse reactions for stiffness. Paraffin to LUE x 10 mins, no adverse reactions, while therapist performed US  to right hand  3.3 mhz, 0.8 w/cm 2,  20%x 8 mins no adverse reactions for stiffness.  Gentle soft tissue and joint mobs/ distraction to digits, hand, wrist bilaterally as welll as right forearm, pt reports  hands and wrists are feeling looser at end of session. Passive composite finger flexion to right hand and then individual finger flexion to left hand . Therapist checked grip strength:  RUE 23 lbs LUE 25 lbs, pt demonstrates bilateral improvements and she reports continuing improving functional use.   10/03/23 Paraffin to RUE x 10 mins, no adverse reactions, while therapist performed US  to left hand 3.3 mhz, 0.8 w/cm 2,  20%x 8 mins no adverse reactions for stiffness. Paraffin to LUE x 10 mins, no adverse reactions, while therapist performed US  to right hand 3.3 mhz, 0.8 w/cm 2,  20%x 8 mins no adverse reactions for stiffness.  Passive composite finger flexion to right hand and then individual finger flexion to left hand . Gentle soft tissue and joint mobs/ distraction to digits, hand, wrist bilaterally as welll as right foream, pt reports feeling looser Resistive gripping with  tree squeeze ball left and right UE's digi flex, light resistance for composite and individual finger flexion for LUE   PATIENT EDUCATION: Education details:   see above Person educated: Patient Education method: Explanation, demonstration, v.c Education comprehension: verbalized understanding, returned demonstration  HOME EXERCISE PROGRAM: AROM yellow putty  GOALS: Goals reviewed with patient? Yes  SHORT TERM GOALS: Target date: 09/14/23  I with HEP  Goal status:  met 06/07/23  2.  I with positioning/ splinting prn to minimize pain and defomity   Goal status: met, 07/13/23  3.  Pt  will increase bilateral grip strength by 5 lbs for increased UE functional use. Upgraded goal Pt will increase bilateral grip strength to at least 25 lbs (after stretching). Baseline: 05/30/23- RUE 8 lbs, LUE 4 lbs Goal status:met 08/01/23-R 20 lbs, L  19 lbs,  08/15/23- RUE 18 lbsLUE 14 lbs continue to address upgraded goal  - RUE 19 lbs, LUE 24- 10/03/23  RUE 23 lbs LUE 25 lbs, met for LUE, ongoing for right. 10/17/23  4. Pt will demonstrate improved RUE fine motor coordination as evidenced by perfroming 9 hole peg test in 23 secs or less.  Goal status: ongoing 10/17/23 5. I with updates to HEP.  Goal status: ongoing 10/17/23 6. Pt will demonstrate improved composite finger flexion for ADLs as evidenced by pt. bringing middle fingertip for RUE within 1/2 an inch of palm.  Baseline: 3/4 inch from palm to middle fingertip  Goal status : ongoing, 3/4 inch 09/19/23 7.Pt will demonstrate improved composite finger flexion for ADLs as evidenced by pt. bringing middle fingertip for LUE within 3/4 an inch of palm.  Baseline: 1 inch from palm to middle finger tip  Goal status: met, 3/4 inch away from middle finger 09/19/23  LONG TERM GOALS: Target date:11/07/23  I with updated HEP  Goal status:met 08/15/22  2.  Pt will improve Quick Dash score to 70% disability Baseline: 75% (05/30/23) Goal status:   improved - however not fully met due to complications from shoulder pain-70.5%08/15/23  3.  Pt will demonstrate at least 65% composte flexion for LUE for increased functional use.  Goal status: met 65-70% , 08/15/23  4.  I with adapted strategies/ adapted equipment to minimize pain and to increase pt I with ADLs/IADLs  Goal status:  met for inital strategies, however continue goal as pt may report additional tasks that can benefit  from modification.ongoing 10/03/23  5.  Pt will demonstrate at least 65% composite flexion for RUE for increased functional use.  Goal status: met 70% 08/15/23  6. Pt will report  that she is able to cut her meat such as steak or chicken modified independently.  [place]: unable  Goal status: met 09/21/23    7. Pt will report increased ease with opening/ closing ziplock bags.    Goal status: met 09/21/23    8. Pt will improve bilateral tip pinch by 2 lbs for increased ease with daily activities.     Goal status: ongoing 09/19/23    9. Pt will improve bilateral lateral pinch by 2 lbs for increased ease with ADLs.    Goal status: ongoing 09/19/23 increased ease rolling dice  ASSESSMENT: CLINICAL IMPRESSION: Pt demonstrates improving flexibility and grip strength.PERFORMANCE DEFICITS: in functional skills including ADLs, IADLs, coordination, sensation, edema, ROM, strength, pain, flexibility, Fine motor control, Gross motor control, endurance, decreased knowledge of precautions, decreased knowledge of use of DME, and UE functional use,, and psychosocial skills including coping strategies, environmental adaptation, habits, interpersonal interactions, and routines and behaviors.   IMPAIRMENTS: are limiting patient from ADLs, IADLs, rest and sleep, education, play, leisure, and social participation.   COMORBIDITIES: may have co-morbidities  that affects occupational performance. Patient will benefit from skilled OT to address above impairments and improve overall function.  MODIFICATION OR ASSISTANCE TO COMPLETE EVALUATION: No modification of tasks or assist necessary to complete an evaluation.  OT OCCUPATIONAL PROFILE AND HISTORY: Detailed assessment: Review of records and additional review of physical, cognitive, psychosocial history related to current functional performance.  CLINICAL DECISION MAKING: LOW - limited treatment options, no task modification necessary  REHAB POTENTIAL: Good  EVALUATION COMPLEXITY: Low      PLAN:  OT FREQUENCY: 2x/week  OT DURATION: 12 weeks( anticipate d/c after 8 weeks dependent on progress)  PLANNED INTERVENTIONS: 02831 OT  Re-evaluation, 97535 self care/ADL training, 02889 therapeutic exercise, 97530 therapeutic activity, 97112 neuromuscular re-education, 97140 manual therapy, 97113 aquatic therapy, 97035 ultrasound, 97018 paraffin, 02960 fluidotherapy, 97010 moist heat, 97010 cryotherapy, 97034 contrast bath, 97760 Orthotics management and training, 02239 Splinting (initial encounter), [id number] Subsequent splinting/medication, passive range of motion, energy conservation, coping strategies training, patient/family education, and DME and/or AE instructions  RECOMMENDED OTHER SERVICES: none  CONSULTED AND AGREED WITH PLAN OF CARE: Patient  PLAN FOR NEXT SESSION:  check 9 hole peg test, functional use and ROM for bilateral hands   Abdulhadi Stopa, OT 12/06/2023, 5:55 PM

## 2023-12-14 DIAGNOSIS — M25511 Pain in right shoulder: Secondary | ICD-10-CM | POA: Diagnosis not present

## 2023-12-19 ENCOUNTER — Ambulatory Visit (INDEPENDENT_AMBULATORY_CARE_PROVIDER_SITE_OTHER): Admitting: Family

## 2023-12-19 ENCOUNTER — Other Ambulatory Visit: Payer: Self-pay | Admitting: Family

## 2023-12-19 ENCOUNTER — Encounter: Payer: Self-pay | Admitting: Family

## 2023-12-19 VITALS — BP 122/70 | HR 85 | Ht 61.0 in | Wt 199.0 lb

## 2023-12-19 DIAGNOSIS — E1169 Type 2 diabetes mellitus with other specified complication: Secondary | ICD-10-CM

## 2023-12-19 DIAGNOSIS — E119 Type 2 diabetes mellitus without complications: Secondary | ICD-10-CM

## 2023-12-19 DIAGNOSIS — I10 Essential (primary) hypertension: Secondary | ICD-10-CM | POA: Diagnosis not present

## 2023-12-19 DIAGNOSIS — E785 Hyperlipidemia, unspecified: Secondary | ICD-10-CM | POA: Diagnosis not present

## 2023-12-19 LAB — CBC WITH DIFFERENTIAL/PLATELET
Basophils Absolute: 0.1 K/uL (ref 0.0–0.1)
Basophils Relative: 0.8 % (ref 0.0–3.0)
Eosinophils Absolute: 0.3 K/uL (ref 0.0–0.7)
Eosinophils Relative: 4 % (ref 0.0–5.0)
HCT: 39.5 % (ref 36.0–46.0)
Hemoglobin: 12.8 g/dL (ref 12.0–15.0)
Lymphocytes Relative: 28.5 % (ref 12.0–46.0)
Lymphs Abs: 2 K/uL (ref 0.7–4.0)
MCHC: 32.5 g/dL (ref 30.0–36.0)
MCV: 85 fl (ref 78.0–100.0)
Monocytes Absolute: 0.5 K/uL (ref 0.1–1.0)
Monocytes Relative: 7.7 % (ref 3.0–12.0)
Neutro Abs: 4.1 K/uL (ref 1.4–7.7)
Neutrophils Relative %: 59 % (ref 43.0–77.0)
Platelets: 321 K/uL (ref 150.0–400.0)
RBC: 4.64 Mil/uL (ref 3.87–5.11)
RDW: 15.8 % — ABNORMAL HIGH (ref 11.5–15.5)
WBC: 6.9 K/uL (ref 4.0–10.5)

## 2023-12-19 LAB — COMPREHENSIVE METABOLIC PANEL WITH GFR
ALT: 32 U/L (ref 0–35)
AST: 19 U/L (ref 0–37)
Albumin: 4.3 g/dL (ref 3.5–5.2)
Alkaline Phosphatase: 63 U/L (ref 39–117)
BUN: 15 mg/dL (ref 6–23)
CO2: 25 meq/L (ref 19–32)
Calcium: 9.1 mg/dL (ref 8.4–10.5)
Chloride: 104 meq/L (ref 96–112)
Creatinine, Ser: 0.69 mg/dL (ref 0.40–1.20)
GFR: 88.79 mL/min (ref >60.00)
Glucose, Bld: 132 mg/dL — ABNORMAL HIGH (ref 70–99)
Potassium: 4.9 meq/L (ref 3.5–5.1)
Sodium: 139 meq/L (ref 135–145)
Total Bilirubin: 0.3 mg/dL (ref 0.2–1.2)
Total Protein: 6.6 g/dL (ref 6.0–8.3)

## 2023-12-19 LAB — HEMOGLOBIN A1C: Hgb A1c MFr Bld: 7.3 % — ABNORMAL HIGH (ref 4.6–6.5)

## 2023-12-19 MED ORDER — ATORVASTATIN CALCIUM 40 MG PO TABS: 40.0000 mg | ORAL_TABLET | Freq: Every evening | ORAL | 3 refills | Status: AC

## 2023-12-19 MED ORDER — OMEPRAZOLE 20 MG PO CPDR: 20.0000 mg | DELAYED_RELEASE_CAPSULE | Freq: Every morning | ORAL | 3 refills | Status: AC

## 2023-12-19 MED ORDER — SYNJARDY XR 12.5-1000 MG PO TB24: 1.0000 | ORAL_TABLET | Freq: Two times a day (BID) | ORAL | 3 refills | Status: DC

## 2023-12-19 MED ORDER — SEMAGLUTIDE (2 MG/DOSE) 8 MG/3ML ~~LOC~~ SOPN: 2.0000 mg | PEN_INJECTOR | SUBCUTANEOUS | 3 refills | Status: DC

## 2023-12-19 MED ORDER — LISINOPRIL 5 MG PO TABS: 5.0000 mg | ORAL_TABLET | Freq: Two times a day (BID) | ORAL | 3 refills | Status: AC

## 2023-12-19 MED ORDER — LEVOCETIRIZINE DIHYDROCHLORIDE 5 MG PO TABS: 5.0000 mg | ORAL_TABLET | Freq: Every evening | ORAL | 3 refills | Status: AC

## 2023-12-19 NOTE — Addendum Note (Signed)
 Addended by: [name] LEITA ORN on: 12/19/2023 02:51 PM   Modules accepted: Orders

## 2023-12-19 NOTE — Progress Notes (Signed)
 Veronica Galvan is a 68 y.o. 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   Rosacea 08/18/2021   Actinic keratosis 08/18/2021   Sensorineural hearing loss (SNHL) of left ear with unrestricted hearing of right ear 07/26/2021   Tinnitus of left ear 07/26/2021   Acute recurrent maxillary sinusitis 06/22/2021   Primary hypertension 01/12/2021   Hyperlipidemia associated with type 2 diabetes mellitus (HCC) 01/12/2021   Arthritis 01/12/2021   Type II diabetes mellitus (HCC) 11/25/2019   Ingrown toenail 09/05/2018   Neck pain 01/29/2018   Tick bite 10/24/2017    Current Outpatient Medications  Medication Sig Dispense Refill   ALPRAZolam  (XANAX ) 0.5 MG tablet Take 1 tablet (0.5 mg total) by mouth at bedtime as needed for anxiety. 30 tablet 0   Ascorbic Acid (VITAMIN C) 1000 MG tablet Take 1,000 mg by mouth every morning.     aspirin EC 81 MG tablet Take 81 mg by mouth every morning.     Blood Glucose Monitoring Suppl DEVI 1 each by Does not apply route in the morning, at noon, and at bedtime. May substitute to any manufacturer covered by patient's insurance. 1 each 0   Calcium  Carb-Cholecalciferol (CALCIUM  600+D3 PO) Take 1 tablet by mouth every morning.     calcium  carbonate (TUMS - DOSED IN MG ELEMENTAL CALCIUM ) 500 MG chewable tablet Chew 2 tablets by mouth daily as needed for indigestion or heartburn.     carboxymethylcellulose (REFRESH TEARS) 0.5 % SOLN Place 1 drop into both eyes 2 (two) times daily.     celecoxib  (CELEBREX ) 200 MG capsule TAKE 1 CAPSULE BY MOUTH DAILY 90 capsule 3   Coenzyme Q10 300 MG CAPS Take  1 capsule by mouth every morning.     diclofenac Sodium (VOLTAREN) 1 % GEL Apply 1 Application topically 2 (two) times daily.     fluticasone  (FLONASE ) 50 MCG/ACT nasal spray Place 2 sprays into both nostrils daily. 16 g 6   glucose blood test strip Use as instructed 100 each 12   LUTEIN-ZEAXANTHIN PO Take 1 tablet by mouth every morning.     MAGNESIUM CITRATE PO Take 500 mg by mouth daily.     Multiple Vitamins-Minerals (MULTIVITAMIN WITH MINERALS) tablet Take 1 tablet by mouth every morning.     Omega-3 Fatty Acids (FISH OIL) 1000 MG CAPS Take 2,000 mg by mouth daily.     RESTASIS 0.05 % ophthalmic emulsion Apply 1 drop to eye.     sodium chloride  (MURO 128) 2 % ophthalmic solution Place 1 drop into both eyes 2 (two) times daily.     TURMERIC PO Take 2,000 mg by mouth daily.     UNABLE TO FIND Take 1 tablet by mouth daily. Cinsulin supplement     vitamin E 180 MG (400 UNITS) capsule Take 400 Units by mouth daily.     atorvastatin  (LIPITOR) 40 MG tablet Take 1 tablet (40 mg total) by mouth every evening. 90 tablet 3   levocetirizine (XYZAL ) 5 MG tablet Take 1 tablet (5 mg total) by mouth every evening. 90 tablet 3   lisinopril  (ZESTRIL ) 5 MG tablet Take 1 tablet (5 mg total) by mouth  2 (two) times daily. 180 tablet 3   omeprazole  (PRILOSEC) 20 MG capsule Take 1 capsule (20 mg total) by mouth every morning. 90 capsule 3   Semaglutide , 2 MG/DOSE, 8 MG/3ML SOPN Inject 2 mg as directed once a week. 9 mL 3   SYNJARDY  XR 12.08-998 MG TB24 Take 1 tablet by mouth 2 (two) times daily. 180 tablet 3   No current facility-administered medications for this visit.    Allergies: Codeine, Benzonatate, and Epinephrine  Past Medical History:  Diagnosis Date   Allergy     Anxiety    Arthritis    hands   Cataract    Diabetes mellitus without complication (HCC)    type 2   Family history of adverse reaction to anesthesia    son has malignant hyperthermia, daughter does not daughter recently had c section  without problems   GERD (gastroesophageal reflux disease)    Headache    sinus   Hyperlipidemia    Hypertension    PONV (postoperative nausea and vomiting)    nausea only   Ulcer, stomach peptic yrs ago    Past Surgical History:  Procedure Laterality Date   APPENDECTOMY     both hells bone spur repair     both heels with metal clips   both shoulder rotator cuff repair     CESAREAN SECTION     x 1   COLONOSCOPY WITH PROPOFOL  N/A 09/07/2016   Procedure: COLONOSCOPY WITH PROPOFOL ;  Surgeon: Vicci Gladis POUR, MD;  Location: [facility];  Service: Endoscopy;  Laterality: N/A;   colonscopy  06/2011   polyps   ELBOW FRACTURE SURGERY Left    EYE SURGERY     FRACTURE SURGERY     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/10/2022   Procedure: REVERSE SHOULDER ARTHROPLASTY;  Surgeon: Melita Drivers, MD;  Location: [facility];  Service: Orthopedics;  Laterality: Right;  Please follow in room 6 if able   TUBAL LIGATION     VESICO-VAGINAL FISTULA REPAIR      Family History  Problem Relation Age of Onset   Hypertension Mother    COPD Mother    Cancer Mother    Arthritis Mother    Hypertension Father    Hyperlipidemia Father    Diabetes Father    Cancer Father    Alcohol abuse Father    Diabetes Brother    Alcohol abuse Brother    Hypertension Brother    Arthritis Maternal Grandmother    Birth defects Maternal Grandmother    Diabetes Paternal Grandmother    Heart disease Paternal Grandfather    Diabetes Paternal Aunt    Diabetes Paternal Aunt    Obesity Son     Social History   Tobacco Use   Smoking status: Former    Current packs/day: 1.00    Average packs/day: 1 pack/day for 14.0 years (14.0 ttl pk-yrs)    Types: Cigarettes   Smokeless tobacco: Never   Tobacco comments:    quit 31 yrs ago  Substance Use Topics   Alcohol use: Yes    Comment: rare    Subjective:   3 month follow up on Type 2 Diabetes; needs to get labs updated; Is optimistic about treatment plan  for right shoulder- 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  Head: Normocephalic and atraumatic  Eyes: Sclera and conjunctiva clear; pupils round and reactive to light; extraocular movements intact  Ears: External normal; canals clear; tympanic membranes normal  Oropharynx: Pink, supple. No suspicious lesions  Neck: Supple without thyromegaly, adenopathy  Lungs: Respirations unlabored; clear to auscultation bilaterally without wheeze, rales, rhonchi  CVS exam: normal rate and regular rhythm.  Neurologic: Alert and oriented; speech intact; face symmetrical; moves all extremities well; CNII-XII intact without focal deficit   Assessment:  1. Type 2 diabetes mellitus without complication, unspecified whether long term insulin  use (HCC)   2. Primary hypertension   3. Hyperlipidemia associated with type 2 diabetes mellitus (HCC)     Plan:  Will update labs today; follow up to be determined based on labs; most likely plan for 6 month follow up;   No follow-ups on file.  No orders of the defined types were placed in this encounter.   Requested Prescriptions   Signed Prescriptions Disp Refills   atorvastatin  (LIPITOR) 40 MG tablet 90 tablet 3    Sig: Take 1 tablet (40 mg total) by mouth every evening.   lisinopril  (ZESTRIL ) 5 MG tablet 180 tablet 3    Sig: Take 1 tablet (5 mg total) by mouth 2 (two) times daily.   omeprazole  (PRILOSEC) 20 MG capsule 90 capsule 3    Sig: Take 1 capsule (20 mg total) by mouth every morning.   levocetirizine (XYZAL ) 5 MG tablet 90 tablet 3    Sig: Take 1 tablet (5 mg total) by mouth every evening.   SYNJARDY  XR 12.08-998 MG TB24 180 tablet 3    Sig: Take 1 tablet by mouth 2 (two) times daily.    Semaglutide , 2 MG/DOSE, 8 MG/3ML SOPN 9 mL 3    Sig: Inject 2 mg as directed once a week.

## 2023-12-20 ENCOUNTER — Encounter: Payer: Self-pay | Admitting: Family

## 2023-12-21 DIAGNOSIS — L821 Other seborrheic keratosis: Secondary | ICD-10-CM | POA: Diagnosis not present

## 2023-12-21 DIAGNOSIS — L578 Other skin changes due to chronic exposure to nonionizing radiation: Secondary | ICD-10-CM | POA: Diagnosis not present

## 2023-12-21 DIAGNOSIS — D225 Melanocytic nevi of trunk: Secondary | ICD-10-CM | POA: Diagnosis not present

## 2023-12-21 DIAGNOSIS — L814 Other melanin hyperpigmentation: Secondary | ICD-10-CM | POA: Diagnosis not present

## 2023-12-21 DIAGNOSIS — L570 Actinic keratosis: Secondary | ICD-10-CM | POA: Diagnosis not present

## 2023-12-21 NOTE — Telephone Encounter (Unsigned)
 Copied from CRM [id number]. Topic: Appointments - Transfer of Care >> Dec 21, 2023 10:29 AM Viola F wrote: Pt is requesting to transfer FROM: Veronica Galvan  Pt is requesting to transfer TO: Dr. Harlene Schroeder Reason for requested transfer: laura is leaving practice and patient wants to know if Dr. Laurance will make exception if not please schedule with [name] Mon  It is the responsibility of the team the patient would like to transfer to (Dr. Schroeder ) to reach out to the patient if for any reason this transfer is not acceptable.

## 2023-12-22 ENCOUNTER — Ambulatory Visit: Payer: Self-pay | Admitting: Family

## 2023-12-22 ENCOUNTER — Encounter: Payer: Self-pay | Admitting: Family Medicine

## 2024-01-15 ENCOUNTER — Encounter: Payer: Self-pay | Admitting: Physical Therapy

## 2024-01-15 ENCOUNTER — Ambulatory Visit: Attending: Family | Admitting: Physical Therapy

## 2024-01-15 DIAGNOSIS — R252 Cramp and spasm: Secondary | ICD-10-CM | POA: Insufficient documentation

## 2024-01-15 DIAGNOSIS — M6281 Muscle weakness (generalized): Secondary | ICD-10-CM | POA: Insufficient documentation

## 2024-01-15 DIAGNOSIS — G8929 Other chronic pain: Secondary | ICD-10-CM | POA: Diagnosis not present

## 2024-01-15 DIAGNOSIS — M25511 Pain in right shoulder: Secondary | ICD-10-CM | POA: Diagnosis not present

## 2024-01-15 DIAGNOSIS — M542 Cervicalgia: Secondary | ICD-10-CM | POA: Insufficient documentation

## 2024-01-15 DIAGNOSIS — M25611 Stiffness of right shoulder, not elsewhere classified: Secondary | ICD-10-CM | POA: Diagnosis not present

## 2024-01-15 NOTE — Therapy (Signed)
 OUTPATIENT PHYSICAL THERAPY SHOULDER    Patient Name: Veronica Galvan MRN: [id number] DOB:[birth year], 68 y.o., female Today's Date: 01/15/2024  END OF SESSION:  PT End of Session - 01/15/24 1312     Visit Number 1    Date for PT Re-Evaluation 04/15/24    Authorization Type [employer] [place]    PT Start Time 1310    PT Stop Time 1400    PT Time Calculation (min) 50 min    Activity Tolerance Patient tolerated treatment well    Behavior During Therapy WFL for tasks assessed/performed           Past Medical History:  Diagnosis Date   Allergy     Anxiety    Arthritis    hands   Cataract    Diabetes mellitus without complication (HCC)    type 2   Family history of adverse reaction to anesthesia    son has malignant hyperthermia, daughter does not daughter recently had c section without problems   GERD (gastroesophageal reflux disease)    Headache    sinus   Hyperlipidemia    Hypertension    PONV (postoperative nausea and vomiting)    nausea only   Ulcer, stomach peptic yrs ago   Past Surgical History:  Procedure Laterality Date   APPENDECTOMY     both hells bone spur repair     both heels with metal clips   both shoulder rotator cuff repair     CESAREAN SECTION     x 1   COLONOSCOPY WITH PROPOFOL  N/A 09/07/2016   Procedure: COLONOSCOPY WITH PROPOFOL ;  Surgeon: Vicci Gladis POUR, MD;  Location: [facility];  Service: Endoscopy;  Laterality: N/A;   colonscopy  06/2011   polyps   ELBOW FRACTURE SURGERY Left    EYE SURGERY     FRACTURE SURGERY     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/10/2022   Procedure: REVERSE SHOULDER ARTHROPLASTY;  Surgeon: Melita Drivers, MD;  Location: [facility];  Service: Orthopedics;  Laterality: Right;  Please follow in room 6 if able   TUBAL LIGATION     VESICO-VAGINAL FISTULA REPAIR     Patient Active Problem List   Diagnosis Date Noted   Gastroesophageal reflux disease 11/16/2021   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 08/18/2021   Dermatofibroma 08/18/2021   History of malignant neoplasm of skin 08/18/2021   Lentigo 08/18/2021   Melanocytic nevi of trunk 08/18/2021   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   Rosacea 08/18/2021   Actinic keratosis 08/18/2021   Sensorineural hearing loss (SNHL) of left ear with unrestricted hearing of right ear 07/26/2021   Tinnitus of left ear 07/26/2021   Acute recurrent maxillary sinusitis 06/22/2021   Primary hypertension 01/12/2021   Hyperlipidemia associated with type 2 diabetes mellitus (HCC) 01/12/2021   Arthritis 01/12/2021   Type II diabetes mellitus (HCC) 11/25/2019   Ingrown toenail 09/05/2018   Neck pain 01/29/2018   Tick bite 10/24/2017    PCP: [name], [name]  REFERRING PROVIDER:[name], MD  REFERRING DIAG: s/p right reverse TSA with complications  THERAPY DIAG:  Muscle weakness (generalized)  Chronic right shoulder pain  Stiffness of right shoulder, not elsewhere classified  Cervicalgia  Spasm  Rationale for Evaluation and Treatment: Rehabilitation  ONSET DATE: 01/01/24  SUBJECTIVE:  SUBJECTIVE STATEMENT:  Patient had a fall on her deck, tripped on a board that was sticking up and had a severe fracture of the right proximal humerus, 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 We saw her in PT for many visits.  She had good progress but during a very busy time from Thanksgiving to [place] Years she started having pain and some regression of motion.  She has had x-rays, bone scan and a CT.  She saw [name] after the CT.  Feels like it could be an infection, PT vs a revision.   11/30/23 Patient reports that she saw the surgeon she is frustrated and upset as she feels that she still has no answers, it is felt that there is no  loosening and no infection.  She is still hurting with basic ADL's, her AROM decreased beginning in March, as she started having increased pain in mid January after a very busy and active few month, she has an order for the left shoulder and the okay for us  to see her for her right shoulder for another month PAIN:  Are you having pain? Yes: NPRS scale: 7/10 Pain location: right shoulder, HA, left shoulder pain up to 8/10 with motions Pain description: dull ache at rest, sharp with motions Aggravating factors: quick motions, movements pain up to 10/10 Relieving factors: ice, rest, Tylenol  at best 3/10  PRECAUTIONS: none  RED FLAGS: None   WEIGHT BEARING RESTRICTIONS: No  FALLS:  Has patient fallen in last 6 months? no  LIVING ENVIRONMENT: Lives with: lives alone Lives in: House/apartment Stairs: No Has following equipment at home: None  OCCUPATION: retired  PLOF: Independent, very active with grandkids, does 5 [facility] a [facility] [employer]  PATIENT GOALS:dress without difficulty, do hair, have good ROM and less pain  NEXT MD VISIT: Mid November  OBJECTIVE:   DIAGNOSTIC FINDINGS:  See above  PATIENT SURVEYS:    COGNITION: Overall cognitive status: Within functional limits for tasks assessed     SENSATION: WFL  POSTURE: Fwd head, rounded shoulders, elevated and guarded shoulder  UPPER EXTREMITY ROM:   Active ROM Right AROM  eval Left AROM Eval                    AROM 11/30/23  Shoulder flexion 105 125                    R 120  Shoulder extension                        Shoulder abduction 85 100                    Right 100  Shoulder adduction                        Shoulder internal rotation 0 30                    Right 28  Shoulder external rotation 46 65  j                    Right 60  Elbow flexion                        Elbow extension                        Wrist flexion                        Wrist extension                        Wrist ulnar deviation                        Wrist radial deviation                        Wrist pronation                        Wrist supination                        (Blank rows = not tested)  UPPER EXTREMITY MMT:  In the available ROM  MMT Right eval Left eval  Shoulder flexion 4- 4-  Shoulder extension    Shoulder abduction    Shoulder adduction    Shoulder internal rotation 4+ 4+  Shoulder external rotation 4-P! 4  Middle trapezius    Lower trapezius    Elbow flexion    Elbow extension    Wrist flexion    Wrist extension    Wrist ulnar deviation    Wrist radial deviation    Wrist pronation    Wrist supination    Grip strength (lbs)    (Blank rows = not tested)  PALPATION:  Very tight and tender in the pectoral, upper trap, the entire right upper arm, slight warmth in the right anterior lateral arm  Positive impingement on the right   TODAY'S TREATMENT:                                                                                                                                         DATE:  01/15/24 Vaso bilateral shoulders medium pressure 34 degrees   PATIENT EDUCATION: Education details: poc Person educated: Patient Education method:  Programmer, multimedia, Facilities manager, Actor cues, Verbal cues, and Handouts Education comprehension: verbalized understanding  HOME EXERCISE PROGRAM:  ASSESSMENT:  CLINICAL IMPRESSION:  Evaluation:  Patient is very frustrated, she had the right reverse TSA last July.  She did very well until around Christmas, then she really started having increased pain and lost some ROM.  She has had blood work done, US , bone scan  and CT performed.  There has been some talk of a possible infection, MD feels like she may benefit from a revision.  She would like to try PT again and the MD agreed.  She has lost ROM since I last saw her, she also is having some significant left shoulder pain from over using it.   July 2025: Patient saw surgeon, the bone scan said did not feel that there was infection or loosening however the surgeon is not so sure, he wants to do an US  to be sure, that is scheduled for the next few weeks, since she has not been to PT in a month I re-measured her ROM, she has lost at least 15 degrees of motion and more for all motions.  She is also having more pain in the left shoulder possibly from overdoing it, because she is trying to not use the right shoulder.    OBJECTIVE IMPAIRMENTS: cardiopulmonary status limiting activity, decreased activity tolerance, decreased endurance, decreased ROM, decreased strength, increased edema, increased muscle spasms, impaired flexibility, impaired UE functional use, improper body mechanics, postural dysfunction, and pain.  REHAB POTENTIAL: Good  CLINICAL DECISION MAKING: Evolving/moderate complexity  EVALUATION COMPLEXITY: Low   GOALS: Goals reviewed with patient? Yes  SHORT TERM GOALS: Target date: 01/31/24  Independent with initial HEP Goal status: initial  LONG TERM GOALS: Target date: 04/15/24  Decrease pain 50% Goal status:  2.  Dress without difficulty Goal status:  3.  Do hair without difficulty Goal status:  4.  Increase AROM right shoulder  flexion to 130 degrees Goal status:  5.  Increase right shoulder ER to 60 degrees Goal status:   6.  Return to water  aerobics and or gym activity Goal status:  PLAN:  PT FREQUENCY: 1-2x/week  PT DURATION: 12 weeks  PLANNED INTERVENTIONS: Therapeutic exercises, Therapeutic activity, Neuromuscular re-education, Balance training, Gait training, Patient/Family education, Self Care, Joint mobilization, Dry Needling, Electrical stimulation, Cryotherapy, Vasopneumatic device, and Manual therapy  PLAN FOR NEXT SESSION: Try to regain some of her ROM and function, treat pain as needed  01/15/2024, 1:12 PM [facility] [address] [facility] [address] [place], [place], [address] Phone: [phone]   Fax:  [phone]

## 2024-01-31 ENCOUNTER — Encounter: Payer: Self-pay | Admitting: Physical Therapy

## 2024-01-31 ENCOUNTER — Ambulatory Visit: Attending: Orthopaedic Surgery | Admitting: Physical Therapy

## 2024-01-31 DIAGNOSIS — G8929 Other chronic pain: Secondary | ICD-10-CM | POA: Diagnosis not present

## 2024-01-31 DIAGNOSIS — M79641 Pain in right hand: Secondary | ICD-10-CM | POA: Diagnosis not present

## 2024-01-31 DIAGNOSIS — M25511 Pain in right shoulder: Secondary | ICD-10-CM | POA: Diagnosis not present

## 2024-01-31 DIAGNOSIS — M79642 Pain in left hand: Secondary | ICD-10-CM | POA: Diagnosis not present

## 2024-01-31 DIAGNOSIS — R278 Other lack of coordination: Secondary | ICD-10-CM | POA: Insufficient documentation

## 2024-01-31 DIAGNOSIS — M25641 Stiffness of right hand, not elsewhere classified: Secondary | ICD-10-CM | POA: Insufficient documentation

## 2024-01-31 DIAGNOSIS — M25611 Stiffness of right shoulder, not elsewhere classified: Secondary | ICD-10-CM | POA: Diagnosis not present

## 2024-01-31 DIAGNOSIS — R252 Cramp and spasm: Secondary | ICD-10-CM | POA: Diagnosis not present

## 2024-01-31 DIAGNOSIS — M25642 Stiffness of left hand, not elsewhere classified: Secondary | ICD-10-CM | POA: Diagnosis not present

## 2024-01-31 DIAGNOSIS — M542 Cervicalgia: Secondary | ICD-10-CM | POA: Diagnosis not present

## 2024-01-31 DIAGNOSIS — M6281 Muscle weakness (generalized): Secondary | ICD-10-CM | POA: Insufficient documentation

## 2024-01-31 NOTE — Therapy (Signed)
 OUTPATIENT PHYSICAL THERAPY SHOULDER    Patient Name: Veronica Galvan MRN: [id number] DOB:01/16/1956, 68 y.o., female Today's Date: 01/31/2024  END OF SESSION:  PT End of Session - 01/31/24 1656     Visit Number 2    Date for Recertification  04/15/24    Authorization Type [employer] [place]    PT Start Time [phone]    PT Stop Time 1755    PT Time Calculation (min) 59 min    Activity Tolerance Patient tolerated treatment well    Behavior During Therapy WFL for tasks assessed/performed           Past Medical History:  Diagnosis Date   Allergy     Anxiety    Arthritis    hands   Cataract    Diabetes mellitus without complication (HCC)    type 2   Family history of adverse reaction to anesthesia    son has malignant hyperthermia, daughter does not daughter recently had c section without problems   GERD (gastroesophageal reflux disease)    Headache    sinus   Hyperlipidemia    Hypertension    PONV (postoperative nausea and vomiting)    nausea only   Ulcer, stomach peptic yrs ago   Past Surgical History:  Procedure Laterality Date   APPENDECTOMY     both hells bone spur repair     both heels with metal clips   both shoulder rotator cuff repair     CESAREAN SECTION     x 1   COLONOSCOPY WITH PROPOFOL  N/A 09/07/2016   Procedure: COLONOSCOPY WITH PROPOFOL ;  Surgeon: Vicci Gladis POUR, MD;  Location: [facility];  Service: Endoscopy;  Laterality: N/A;   colonscopy  06/2011   polyps   ELBOW FRACTURE SURGERY Left    EYE SURGERY     FRACTURE SURGERY     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/10/2022   Procedure: REVERSE SHOULDER ARTHROPLASTY;  Surgeon: Melita Drivers, MD;  Location: [facility];  Service: Orthopedics;  Laterality: Right;  Please follow in room 6 if able   TUBAL LIGATION     VESICO-VAGINAL FISTULA REPAIR     Patient Active Problem List   Diagnosis Date Noted   Gastroesophageal reflux disease 11/16/2021   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 08/18/2021   Dermatofibroma 08/18/2021   History of malignant neoplasm of skin 08/18/2021   Lentigo 08/18/2021   Melanocytic nevi of trunk 08/18/2021   Nevus lipomatosus cutaneous superficialis 08/18/2021   Rosacea 08/18/2021   Actinic keratosis 08/18/2021   Sensorineural hearing loss (SNHL) of left ear with unrestricted hearing of right ear 07/26/2021   Tinnitus of left ear 07/26/2021   Acute recurrent maxillary sinusitis 06/22/2021   Primary hypertension 01/12/2021   Hyperlipidemia associated with type 2 diabetes mellitus (HCC) 01/12/2021   Arthritis 01/12/2021   Type II diabetes mellitus (HCC) 11/25/2019   Ingrown toenail 09/05/2018   Neck pain 01/29/2018   Tick bite 10/24/2017    PCP: [name], [name]  REFERRING PROVIDER:[name], MD  REFERRING DIAG: s/p right reverse TSA with complications  THERAPY DIAG:  Muscle weakness (generalized)  Chronic right shoulder pain  Stiffness of right shoulder, not elsewhere classified  Cervicalgia  Spasm  Rationale for Evaluation and Treatment: Rehabilitation  ONSET DATE: 01/01/24  SUBJECTIVE:  SUBJECTIVE STATEMENT:  Patient reports that she did baby sit this weekend and was in a lot of pain, reports that the right shoulder just aches all the time, the left arm is only hurting with movements  Evaluation:  Patient had a fall on her deck, tripped on a board that was sticking up and had a severe fracture of the right proximal humerus, 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 We saw her in PT for many visits.  She had good progress but during a very busy time from Thanksgiving to [place] Years she started having pain and some regression of motion.  She has had x-rays, bone scan and a CT.  She saw [name] after the CT.  Feels like it  could be an infection, PT vs a revision.   11/30/23 Patient reports that she saw the surgeon she is frustrated and upset as she feels that she still has no answers, it is felt that there is no loosening and no infection.  She is still hurting with basic ADL's, her AROM decreased beginning in March, as she started having increased pain in mid January after a very busy and active few month, she has an order for the left shoulder and the okay for us  to see her for her right shoulder for another month PAIN:  Are you having pain? Yes: NPRS scale: 7/10 Pain location: right shoulder, HA, left shoulder pain up to 8/10 with motions Pain description: dull ache at rest, sharp with motions Aggravating factors: quick motions, movements pain up to 10/10 Relieving factors: ice, rest, Tylenol  at best 3/10  PRECAUTIONS: none  RED FLAGS: None   WEIGHT BEARING RESTRICTIONS: No  FALLS:  Has patient fallen in last 6 months? no  LIVING ENVIRONMENT: Lives with: lives alone Lives in: House/apartment Stairs: No Has following equipment at home: None  OCCUPATION: retired  PLOF: Independent, very active with grandkids, does 5 [facility] a week [employer]  PATIENT GOALS:dress without difficulty, do hair, have good ROM and less pain  NEXT MD VISIT: Mid November  OBJECTIVE:   DIAGNOSTIC FINDINGS:  See above  PATIENT SURVEYS:    COGNITION: Overall cognitive status: Within functional limits for tasks assessed     SENSATION: WFL  POSTURE: Fwd head, rounded shoulders, elevated and guarded shoulder  UPPER EXTREMITY ROM:   Active ROM Right AROM  eval Left AROM Eval                    AROM 11/30/23  Shoulder flexion 105 125                    R 120  Shoulder extension                        Shoulder abduction 85 100                    Right 100  Shoulder adduction                        Shoulder internal rotation 0 30                    Right 28  Shoulder external rotation 46 65  j                    Right 60  Elbow flexion                        Elbow extension                        Wrist flexion                        Wrist extension                        Wrist ulnar deviation                        Wrist radial deviation                        Wrist pronation                        Wrist supination                        (Blank rows = not tested)  UPPER EXTREMITY MMT:  In the available ROM  MMT Right eval Left eval  Shoulder flexion 4- 4-  Shoulder extension    Shoulder abduction    Shoulder adduction    Shoulder internal rotation 4+ 4+  Shoulder external rotation 4-P! 4  Middle trapezius    Lower trapezius    Elbow flexion    Elbow extension    Wrist flexion    Wrist extension    Wrist ulnar deviation    Wrist radial deviation    Wrist pronation    Wrist supination    Grip strength (lbs)    (Blank rows = not tested)  PALPATION:  Very tight and tender in the pectoral, upper trap, the entire right upper arm, slight warmth in the right anterior lateral arm  Positive impingement on the right   TODAY'S TREATMENT:                                                                                                                                          DATE:  01/31/24 UBE LEvel 3 x 5 minutes 10# rows 10# Lats Wall slides Wall slides 2# supine punches and isometric circles 1# ER/IR Yellow tband ER/IR 1# supine bar flexion Passive stretch to the bilateral shoulders all motions Vaso medium pressure 34 degrees right shouldet  01/15/24 Vaso bilateral shoulders medium pressure 34 degrees   PATIENT EDUCATION: Education details: poc Person educated: Patient Education method: Programmer, multimedia, Facilities manager, Actor cues, Verbal cues, and Handouts Education comprehension: verbalized understanding  HOME EXERCISE PROGRAM:  ASSESSMENT:  CLINICAL IMPRESSION:  Patient describes the right shoulder always hurts, the left shoulder hurts with motions, we initiated exercises today but tried to be gentle and go slow, she seemed to be fearful of pain with many of the exercises but after a few reps she seemed to relax and do the motions better without guarding  Evaluation:  Patient is very frustrated, she had the right reverse TSA last July.  She did very well until around Christmas, then she really started having increased pain and lost some ROM.  She has had blood work done, US , bone scan and CT performed.  There has been some talk of a possible infection, MD feels like she may benefit from a revision.  She would like to try PT again and the MD agreed.  She has lost ROM since I last saw her, she also is having some significant left shoulder pain from over using it.   July 2025: Patient saw surgeon, the bone scan said did not feel that there was infection or loosening however the surgeon is not so sure, he wants to do an US  to be sure, that is scheduled for the next few weeks, since she has not been to PT in a month I re-measured her ROM, she has lost at least 15 degrees of motion and more for all motions.  She is also having more pain in the left shoulder possibly from overdoing it, because she is trying to not use the right  shoulder.    OBJECTIVE IMPAIRMENTS: cardiopulmonary status limiting activity, decreased activity tolerance, decreased endurance, decreased ROM, decreased strength, increased edema, increased muscle spasms, impaired flexibility, impaired UE functional use, improper body mechanics, postural dysfunction, and pain.  REHAB POTENTIAL: Good  CLINICAL DECISION MAKING: Evolving/moderate complexity  EVALUATION COMPLEXITY: Low   GOALS: Goals reviewed with patient? Yes  SHORT TERM GOALS: Target date: 01/31/24  Independent with initial HEP Goal status: met 01/31/24  LONG TERM GOALS: Target date: 04/15/24  Decrease pain 50% Goal status:  2.  Dress without difficulty Goal status:  3.  Do hair without difficulty Goal status:  4.  Increase AROM right shoulder flexion to 130 degrees Goal status:  5.  Increase right shoulder ER to 60 degrees Goal status:   6.  Return to water  aerobics and or gym activity Goal status:  PLAN:  PT FREQUENCY: 1-2x/week  PT DURATION: 12 weeks  PLANNED INTERVENTIONS: Therapeutic exercises, Therapeutic activity, Neuromuscular re-education, Balance training, Gait training, Patient/Family education, Self Care, Joint mobilization, Dry Needling, Electrical stimulation, Cryotherapy, Vasopneumatic device, and Manual therapy  PLAN FOR NEXT SESSION: Try to regain some of her ROM and function, treat pain as needed  01/31/2024, 4:57 PM [place] [facility] [address] [facility] [place], [place], [address] Phone: [phone]   Fax:  [phone]

## 2024-02-01 NOTE — Progress Notes (Signed)
 Referring-[name] Eliza Elbe, [name] Reason for referral-hypertension and aortic atherosclerosis  HPI: 68 year old female for evaluation of hypertension and aortic atherosclerosis at request of Leita Eliza Elbe, [name].  Aortic atherosclerosis noted on recent CT of right shoulder.  Cardiology now asked to evaluate.  Patient has been with more vigorous activities but not routine activities.  No orthopnea, PND, pedal edema, chest pain or syncope.  Current Outpatient Medications  Medication Sig Dispense Refill   ALPRAZolam  (XANAX ) 0.5 MG tablet Take 1 tablet (0.5 mg total) by mouth at bedtime as needed for anxiety. 30 tablet 0   Ascorbic Acid (VITAMIN C) 1000 MG tablet Take 1,000 mg by mouth every morning.     aspirin EC 81 MG tablet Take 81 mg by mouth every morning.     atorvastatin  (LIPITOR) 40 MG tablet Take 1 tablet (40 mg total) by mouth every evening. 90 tablet 3   Blood Glucose Monitoring Suppl DEVI 1 each by Does not apply route in the morning, at noon, and at bedtime. May substitute to any manufacturer covered by patient's insurance. 1 each 0   Calcium  Carb-Cholecalciferol (CALCIUM  600+D3 PO) Take 1 tablet by mouth every morning.     calcium  carbonate (TUMS - DOSED IN MG ELEMENTAL CALCIUM ) 500 MG chewable tablet Chew 2 tablets by mouth daily as needed for indigestion or heartburn.     carboxymethylcellulose (REFRESH TEARS) 0.5 % SOLN Place 1 drop into both eyes 2 (two) times daily.     celecoxib  (CELEBREX ) 200 MG capsule TAKE 1 CAPSULE BY MOUTH DAILY 90 capsule 3   Coenzyme Q10 300 MG CAPS Take 1 capsule by mouth every morning.     diclofenac Sodium (VOLTAREN) 1 % GEL Apply 1 Application topically 2 (two) times daily.     fluticasone  (FLONASE ) 50 MCG/ACT nasal spray Place 2 sprays into both nostrils daily. 16 g 6   glucose blood test strip Use as instructed 100 each 12   levocetirizine (XYZAL ) 5 MG tablet Take 1 tablet (5 mg total) by mouth every evening. 90 tablet 3    lisinopril  (ZESTRIL ) 5 MG tablet Take 1 tablet (5 mg total) by mouth 2 (two) times daily. 180 tablet 3   LUTEIN-ZEAXANTHIN PO Take 1 tablet by mouth every morning.     MAGNESIUM CITRATE PO Take 500 mg by mouth daily.     Multiple Vitamins-Minerals (MULTIVITAMIN WITH MINERALS) tablet Take 1 tablet by mouth every morning.     Omega-3 Fatty Acids (FISH OIL) 1000 MG CAPS Take 2,000 mg by mouth daily.     omeprazole  (PRILOSEC) 20 MG capsule Take 1 capsule (20 mg total) by mouth every morning. 90 capsule 3   RESTASIS 0.05 % ophthalmic emulsion Apply 1 drop to eye.     Semaglutide , 2 MG/DOSE, 8 MG/3ML SOPN Inject 2 mg as directed once a week. 9 mL 3   sodium chloride  (MURO 128) 2 % ophthalmic solution Place 1 drop into both eyes 2 (two) times daily.     SYNJARDY  XR 12.08-998 MG TB24 Take 1 tablet by mouth 2 (two) times daily. 180 tablet 3   TURMERIC PO Take 2,000 mg by mouth daily.     UNABLE TO FIND Take 1 tablet by mouth daily. Cinsulin supplement     vitamin E 180 MG (400 UNITS) capsule Take 400 Units by mouth daily.     No current facility-administered medications for this visit.    Allergies  Allergen Reactions   Codeine Hives and Itching  Benzonatate Rash   Epinephrine Palpitations     Past Medical History:  Diagnosis Date   Allergy     Anxiety    Arthritis    hands   Cataract    Diabetes mellitus without complication (HCC)    type 2   Family history of adverse reaction to anesthesia    son has malignant hyperthermia, daughter does not daughter recently had c section without problems   GERD (gastroesophageal reflux disease)    Headache    sinus   Hyperlipidemia    Hypertension    PONV (postoperative nausea and vomiting)    nausea only   Ulcer, stomach peptic yrs ago    Past Surgical History:  Procedure Laterality Date   APPENDECTOMY     both hells bone spur repair     both heels with metal clips   both shoulder rotator cuff repair     CESAREAN SECTION     x 1    COLONOSCOPY WITH PROPOFOL  N/A 09/07/2016   Procedure: COLONOSCOPY WITH PROPOFOL ;  Surgeon: Vicci Gladis POUR, MD;  Location: [facility];  Service: Endoscopy;  Laterality: N/A;   colonscopy  06/2011   polyps   ELBOW FRACTURE SURGERY Left    EYE SURGERY     FRACTURE SURGERY     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/10/2022   Procedure: REVERSE SHOULDER ARTHROPLASTY;  Surgeon: Melita Drivers, MD;  Location: [facility];  Service: Orthopedics;  Laterality: Right;  Please follow in room 6 if able   TUBAL LIGATION     VESICO-VAGINAL FISTULA REPAIR      Social History   Socioeconomic History   Marital status: Widowed    Spouse name: Not on file   Number of children: 2   Years of education: Not on file   Highest education level: Bachelor's degree (e.g., BA, AB, BS)  Occupational History   Not on file  Tobacco Use   Smoking status: Former    Current packs/day: 1.00    Average packs/day: 1 pack/day for 14.0 years (14.0 ttl pk-yrs)    Types: Cigarettes   Smokeless tobacco: Never   Tobacco comments:    quit 31 yrs ago  Substance and Sexual Activity   Alcohol use: Yes    Comment: rare   Drug use: No   Sexual activity: Not Currently    Birth control/protection: Post-menopausal  Other Topics Concern   Not on file  Social History Narrative   Right handed   Caffeine intake none   Social Drivers of Health   Financial Resource Strain: Low Risk  (11/28/2023)   Overall Financial Resource Strain (CARDIA)    Difficulty of Paying Living Expenses: Not hard at all  Food Insecurity: No Food Insecurity (11/28/2023)   Hunger Vital Sign    Worried About Running Out of Food in the Last Year: Never true    Ran Out of Food in the Last Year: Never true  Transportation Needs: No Transportation Needs (11/28/2023)   PRAPARE - Administrator, Civil Service (Medical): No    Lack of Transportation (Non-Medical): No  Physical Activity: Sufficiently Active (11/28/2023)   Exercise Vital  Sign    Days of Exercise per Week: 4 days    Minutes of Exercise per Session: 60 min  Stress: No Stress Concern Present (11/28/2023)   [employer] of Occupational Health - Occupational Stress Questionnaire    Feeling of Stress: Only a little  Social Connections: Moderately Integrated (11/28/2023)   Social Connection and  Isolation Panel    Frequency of Communication with Friends and Family: More than three times a week    Frequency of Social Gatherings with Friends and Family: More than three times a week    Attends Religious Services: More than 4 times per year    Active Member of [employer] or Organizations: Yes    Attends Banker Meetings: More than 4 times per year    Marital Status: Widowed  Intimate Partner Violence: Not At Risk (09/13/2023)   Humiliation, Afraid, Rape, and Kick questionnaire    Fear of Current or Ex-Partner: No    Emotionally Abused: No    Physically Abused: No    Sexually Abused: No    Family History  Problem Relation Age of Onset   Hypertension Mother    COPD Mother    Cancer Mother    Arthritis Mother    Hypertension Father    Hyperlipidemia Father    Diabetes Father    Cancer Father    Alcohol abuse Father    Diabetes Brother    Alcohol abuse Brother    Hypertension Brother    Arthritis Maternal Grandmother    Birth defects Maternal Grandmother    Diabetes Paternal Grandmother    Heart disease Paternal Grandfather    Diabetes Paternal Aunt    Diabetes Paternal Aunt    Obesity Son     ROS: no fevers or chills, productive cough, hemoptysis, dysphasia, odynophagia, melena, hematochezia, dysuria, hematuria, rash, seizure activity, orthopnea, PND, pedal edema, claudication. Remaining systems are negative.  Physical Exam:   Blood pressure 138/74, pulse (!) 107, height 5' 1 (1.549 m), weight 197 lb (89.4 kg), SpO2 91%.  General:  Well developed/well nourished in NAD Skin warm/dry Patient not depressed No peripheral  clubbing Back-normal HEENT-normal/normal eyelids Neck supple/normal carotid upstroke bilaterally; no bruits; no JVD; no thyromegaly chest - CTA/ normal expansion CV - RRR/normal S1 and S2; no murmurs, rubs or gallops;  PMI nondisplaced Abdomen -NT/ND, no HSM, no mass, + bowel sounds, no bruit 2+ femoral pulses, no bruits Ext-no edema, chords, 2+ DP Neuro-grossly nonfocal  EKG Interpretation Date/Time:  Thursday February 15 2024 10:20:21 EDT Ventricular Rate:  107 PR Interval:  132 QRS Duration:  84 QT Interval:  326 QTC Calculation: 435 R Axis:   -1  Text Interpretation: Sinus tachycardia Confirmed by Pietro Rogue ([id number]) on 02/15/2024 10:22:44 AM    A/P  1 hypertension-blood pressure controlled.  Continue present medical regimen.  2 aortic atherosclerosis-continue aspirin and statin.  3 hyperlipidemia-continue statin.  Goal LDL less than 55.  Check lipids.  Recent LFTs normal.  Will arrange calcium  score for risk stratification.  Rogue Pietro, MD

## 2024-02-02 ENCOUNTER — Encounter: Payer: Self-pay | Admitting: Physical Therapy

## 2024-02-02 ENCOUNTER — Ambulatory Visit: Admitting: Physical Therapy

## 2024-02-02 DIAGNOSIS — M542 Cervicalgia: Secondary | ICD-10-CM

## 2024-02-02 DIAGNOSIS — G8929 Other chronic pain: Secondary | ICD-10-CM | POA: Diagnosis not present

## 2024-02-02 DIAGNOSIS — M25611 Stiffness of right shoulder, not elsewhere classified: Secondary | ICD-10-CM | POA: Diagnosis not present

## 2024-02-02 DIAGNOSIS — R252 Cramp and spasm: Secondary | ICD-10-CM | POA: Diagnosis not present

## 2024-02-02 DIAGNOSIS — M79642 Pain in left hand: Secondary | ICD-10-CM | POA: Diagnosis not present

## 2024-02-02 DIAGNOSIS — M25641 Stiffness of right hand, not elsewhere classified: Secondary | ICD-10-CM | POA: Diagnosis not present

## 2024-02-02 DIAGNOSIS — R278 Other lack of coordination: Secondary | ICD-10-CM | POA: Diagnosis not present

## 2024-02-02 DIAGNOSIS — M6281 Muscle weakness (generalized): Secondary | ICD-10-CM | POA: Diagnosis not present

## 2024-02-02 DIAGNOSIS — M25511 Pain in right shoulder: Secondary | ICD-10-CM | POA: Diagnosis not present

## 2024-02-02 DIAGNOSIS — M79641 Pain in right hand: Secondary | ICD-10-CM | POA: Diagnosis not present

## 2024-02-02 DIAGNOSIS — M25642 Stiffness of left hand, not elsewhere classified: Secondary | ICD-10-CM | POA: Diagnosis not present

## 2024-02-02 NOTE — Therapy (Signed)
 OUTPATIENT PHYSICAL THERAPY SHOULDER    Patient Name: Veronica Galvan MRN: [id number] DOB:1955-11-18, 68 y.o., female Today's Date: 02/02/2024  END OF SESSION:  PT End of Session - 02/02/24 0837     Visit Number 3    Date for Recertification  04/15/24    Authorization Type Veronica Galvan    PT Start Time [phone]    PT Stop Time 0935    PT Time Calculation (min) 58 min    Activity Tolerance Patient tolerated treatment well    Behavior During Therapy Veronica Galvan for tasks assessed/performed           Past Medical History:  Diagnosis Date   Allergy     Anxiety    Arthritis    hands   Cataract    Diabetes mellitus without complication (HCC)    type 2   Family history of adverse reaction to anesthesia    son has malignant hyperthermia, daughter does not daughter recently had c section without problems   GERD (gastroesophageal reflux disease)    Headache    sinus   Hyperlipidemia    Hypertension    PONV (postoperative nausea and vomiting)    nausea only   Ulcer, stomach peptic yrs ago   Past Surgical History:  Procedure Laterality Date   APPENDECTOMY     both hells bone spur repair     both heels with metal clips   both shoulder rotator cuff repair     CESAREAN SECTION     x 1   COLONOSCOPY WITH PROPOFOL  N/A 09/07/2016   Procedure: COLONOSCOPY WITH PROPOFOL ;  Surgeon: Veronica Gladis POUR, MD;  Location: [facility];  Service: Endoscopy;  Laterality: N/A;   colonscopy  06/2011   polyps   ELBOW FRACTURE SURGERY Left    EYE SURGERY     FRACTURE SURGERY     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/10/2022   Procedure: REVERSE SHOULDER ARTHROPLASTY;  Surgeon: Veronica Drivers, MD;  Location: [facility];  Service: Orthopedics;  Laterality: Right;  Please follow in room 6 if able   TUBAL LIGATION     VESICO-VAGINAL FISTULA REPAIR     Patient Active Problem List   Diagnosis Date Noted   Gastroesophageal reflux disease 11/16/2021   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 08/18/2021   Dermatofibroma 08/18/2021   History of malignant neoplasm of skin 08/18/2021   Lentigo 08/18/2021   Melanocytic nevi of trunk 08/18/2021   Nevus lipomatosus cutaneous superficialis 08/18/2021   Rosacea 08/18/2021   Actinic keratosis 08/18/2021   Sensorineural hearing loss (SNHL) of left ear with unrestricted hearing of right ear 07/26/2021   Tinnitus of left ear 07/26/2021   Acute recurrent maxillary sinusitis 06/22/2021   Primary hypertension 01/12/2021   Hyperlipidemia associated with type 2 diabetes mellitus (HCC) 01/12/2021   Arthritis 01/12/2021   Type II diabetes mellitus (HCC) 11/25/2019   Ingrown toenail 09/05/2018   Neck pain 01/29/2018   Tick bite 10/24/2017    PCP: [name], [name]  REFERRING PROVIDER:[name], MD  REFERRING DIAG: s/p right reverse TSA with complications  THERAPY DIAG:  Muscle weakness (generalized)  Chronic right shoulder pain  Stiffness of right shoulder, not elsewhere classified  Cervicalgia  Spasm  Rationale for Evaluation and Treatment: Rehabilitation  ONSET DATE: 01/01/24  SUBJECTIVE:  SUBJECTIVE STATEMENT:  I am sore, I tried to lift a case of water  into a cart and the arm is really hurting  Evaluation:  Patient had a fall on her deck, tripped on a board that was sticking up and had a severe fracture of the right proximal humerus, 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 We saw her in PT for many visits.  She had good progress but during a very busy time from Thanksgiving to [name] she started having pain and some regression of motion.  She has had x-rays, bone scan and a CT.  She saw Veronica Galvan after the CT.  Feels like it could be an infection, PT vs a revision.   11/30/23 Patient reports that she saw the surgeon  she is frustrated and upset as she feels that she still has no answers, it is felt that there is no loosening and no infection.  She is still hurting with basic ADL's, her AROM decreased beginning in March, as she started having increased pain in mid January after a very busy and active few month, she has an order for the left shoulder and the okay for us  to see her for her right shoulder for another month PAIN:  Are you having pain? Yes: NPRS scale: 7/10 Pain location: right shoulder, HA, left shoulder pain up to 8/10 with motions Pain description: dull ache at rest, sharp with motions Aggravating factors: quick motions, movements pain up to 10/10 Relieving factors: ice, rest, Tylenol  at best 3/10  PRECAUTIONS: none  RED FLAGS: None   WEIGHT BEARING RESTRICTIONS: No  FALLS:  Has patient fallen in last 6 months? no  LIVING ENVIRONMENT: Lives with: lives alone Lives in: House/apartment Stairs: No Has following equipment at home: None  OCCUPATION: retired  PLOF: Independent, very active with grandkids, does 5 classes a week at [employer]  PATIENT GOALS:dress without difficulty, do hair, have good ROM and less pain  NEXT MD VISIT: Mid November  OBJECTIVE:   DIAGNOSTIC FINDINGS:  See above  PATIENT SURVEYS:    COGNITION: Overall cognitive status: Within functional limits for tasks assessed     SENSATION: WFL  POSTURE: Fwd head, rounded shoulders, elevated and guarded shoulder  UPPER EXTREMITY ROM:   Active ROM Right AROM  eval Left AROM Eval                    AROM 11/30/23  Shoulder flexion 105 125                    R 120  Shoulder extension                        Shoulder abduction 85 100                    Right 100  Shoulder adduction                        Shoulder internal rotation 0 30                    Right 28  Shoulder external rotation 46 65  j                    Right 60  Elbow flexion                        Elbow extension                        Wrist flexion                        Wrist extension                        Wrist ulnar deviation                        Wrist radial deviation                        Wrist pronation                        Wrist supination                        (Blank rows = not tested)  UPPER EXTREMITY MMT:  In the available ROM  MMT Right eval Left eval  Shoulder flexion 4- 4-  Shoulder extension    Shoulder abduction    Shoulder adduction    Shoulder internal rotation 4+ 4+  Shoulder external rotation 4-P! 4  Middle trapezius    Lower trapezius    Elbow flexion    Elbow extension    Wrist flexion    Wrist extension    Wrist ulnar deviation    Wrist radial deviation    Wrist pronation    Wrist supination    Grip strength (lbs)    (Blank rows = not tested)  PALPATION:  Very tight and tender in the pectoral, upper trap, the entire right upper arm, slight warmth in the right anterior lateral arm  Positive impingement on the right   TODAY'S TREATMENT:                                                                                                                                         DATE:  02/02/24 UBE level 4 x 6 minutes 5# rows 15#  lats Wall slides 1# circles 1# overhead side to side Wall ball isometric circles Passive stretch to the right shoulder all motions to discomfort STM to the right upper trap and neck Vaso 34 degrees medium pressure  01/31/24 UBE LEvel 3 x 5 minutes 10# rows 10# Lats Wall slides Wall slides 2# supine punches and isometric circles 1# ER/IR Yellow tband ER/IR 1# supine bar flexion Passive stretch to the bilateral shoulders all  motions Vaso medium pressure 34 degrees right shouldet  01/15/24 Vaso bilateral shoulders medium pressure 34 degrees   PATIENT EDUCATION: Education details: poc Person educated: Patient Education method: Programmer, multimedia, Facilities manager, Actor cues, Verbal cues, and Handouts Education comprehension: verbalized understanding  HOME EXERCISE PROGRAM:  ASSESSMENT:  CLINICAL IMPRESSION:  Patient describes the right shoulder always hurts, the left shoulder hurts with motions, I continued to work on strength and functional motions, lowering some weight and adding to others.  She is significantly tight and very tender and sore in the right upper trap and neck from guarding.  Evaluation:  Patient is very frustrated, she had the right reverse TSA last July.  She did very well until around Christmas, then she really started having increased pain and lost some ROM.  She has had blood work done, US , bone scan and CT performed.  There has been some talk of a possible infection, MD feels like she may benefit from a revision.  She would like to try PT again and the MD agreed.  She has lost ROM since I last saw her, she also is having some significant left shoulder pain from over using it.   July 2025: Patient saw surgeon, the bone scan said did not feel that there was infection or loosening however the surgeon is not so sure, he wants to do an US  to be sure, that is scheduled for the next few weeks, since she has not been to PT in a month I re-measured her ROM, she has lost at  least 15 degrees of motion and more for all motions.  She is also having more pain in the left shoulder possibly from overdoing it, because she is trying to not use the right shoulder.    OBJECTIVE IMPAIRMENTS: cardiopulmonary status limiting activity, decreased activity tolerance, decreased endurance, decreased ROM, decreased strength, increased edema, increased muscle spasms, impaired flexibility, impaired UE functional use, improper body mechanics, postural dysfunction, and pain.  REHAB POTENTIAL: Good  CLINICAL DECISION MAKING: Evolving/moderate complexity  EVALUATION COMPLEXITY: Low   GOALS: Goals reviewed with patient? Yes  SHORT TERM GOALS: Target date: 01/31/24  Independent with initial HEP Goal status: met 01/31/24  LONG TERM GOALS: Target date: 04/15/24  Decrease pain 50% Goal status:  2.  Dress without difficulty Goal status:  3.  Do hair without difficulty Goal status:  4.  Increase AROM right shoulder flexion to 130 degrees Goal status:  5.  Increase right shoulder ER to 60 degrees Goal status:   6.  Return to water  aerobics and or gym activity Goal status:  PLAN:  PT FREQUENCY: 1-2x/week  PT DURATION: 12 weeks  PLANNED INTERVENTIONS: Therapeutic exercises, Therapeutic activity, Neuromuscular re-education, Balance training, Gait training, Patient/Family education, Self Care, Joint mobilization, Dry Needling, Electrical stimulation, Cryotherapy, Vasopneumatic device, and Manual therapy  PLAN FOR NEXT SESSION: Try to regain some of her ROM and function, treat pain as needed  02/02/2024, 8:37 AM [facility] W[facility] [place], [place], [address] Phone: [phone]   Fax:  [phone]

## 2024-02-06 ENCOUNTER — Encounter: Payer: Self-pay | Admitting: Physical Therapy

## 2024-02-06 ENCOUNTER — Ambulatory Visit: Admitting: Physical Therapy

## 2024-02-06 DIAGNOSIS — G8929 Other chronic pain: Secondary | ICD-10-CM | POA: Diagnosis not present

## 2024-02-06 DIAGNOSIS — M25611 Stiffness of right shoulder, not elsewhere classified: Secondary | ICD-10-CM

## 2024-02-06 DIAGNOSIS — R252 Cramp and spasm: Secondary | ICD-10-CM

## 2024-02-06 DIAGNOSIS — M6281 Muscle weakness (generalized): Secondary | ICD-10-CM

## 2024-02-06 DIAGNOSIS — R278 Other lack of coordination: Secondary | ICD-10-CM | POA: Diagnosis not present

## 2024-02-06 DIAGNOSIS — M542 Cervicalgia: Secondary | ICD-10-CM | POA: Diagnosis not present

## 2024-02-06 DIAGNOSIS — M25511 Pain in right shoulder: Secondary | ICD-10-CM | POA: Diagnosis not present

## 2024-02-06 DIAGNOSIS — M79642 Pain in left hand: Secondary | ICD-10-CM | POA: Diagnosis not present

## 2024-02-06 DIAGNOSIS — M25642 Stiffness of left hand, not elsewhere classified: Secondary | ICD-10-CM | POA: Diagnosis not present

## 2024-02-06 DIAGNOSIS — M79641 Pain in right hand: Secondary | ICD-10-CM | POA: Diagnosis not present

## 2024-02-06 DIAGNOSIS — M25641 Stiffness of right hand, not elsewhere classified: Secondary | ICD-10-CM | POA: Diagnosis not present

## 2024-02-06 NOTE — Therapy (Signed)
 OUTPATIENT PHYSICAL THERAPY SHOULDER    Patient Name: Veronica Galvan MRN: [id number] DOB:[birth year], 68 y.o., female Today's Date: 02/06/2024  END OF SESSION:  PT End of Session - 02/06/24 0759     Visit Number 4    Date for Recertification  04/15/24    Authorization Type [employer] [place]    PT Start Time [phone]    PT Stop Time 0856    PT Time Calculation (min) 57 min    Activity Tolerance Patient tolerated treatment well    Behavior During Therapy [facility] for tasks assessed/performed           Past Medical History:  Diagnosis Date   Allergy     Anxiety    Arthritis    hands   Cataract    Diabetes mellitus without complication (HCC)    type 2   Family history of adverse reaction to anesthesia    son has malignant hyperthermia, daughter does not daughter recently had c section without problems   GERD (gastroesophageal reflux disease)    Headache    sinus   Hyperlipidemia    Hypertension    PONV (postoperative nausea and vomiting)    nausea only   Ulcer, stomach peptic yrs ago   Past Surgical History:  Procedure Laterality Date   APPENDECTOMY     both hells bone spur repair     both heels with metal clips   both shoulder rotator cuff repair     CESAREAN SECTION     x 1   COLONOSCOPY WITH PROPOFOL  N/A 09/07/2016   Procedure: COLONOSCOPY WITH PROPOFOL ;  Surgeon: Vicci Gladis POUR, MD;  Location: [facility];  Service: Endoscopy;  Laterality: N/A;   colonscopy  06/2011   polyps   ELBOW FRACTURE SURGERY Left    EYE SURGERY     FRACTURE SURGERY     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/10/2022   Procedure: REVERSE SHOULDER ARTHROPLASTY;  Surgeon: Melita Drivers, MD;  Location: [facility];  Service: Orthopedics;  Laterality: Right;  Please follow in room 6 if able   TUBAL LIGATION     VESICO-VAGINAL FISTULA REPAIR     Patient Active Problem List   Diagnosis Date Noted   Gastroesophageal reflux disease 11/16/2021   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 08/18/2021   Dermatofibroma 08/18/2021   History of malignant neoplasm of skin 08/18/2021   Lentigo 08/18/2021   Melanocytic nevi of trunk 08/18/2021   Nevus lipomatosus cutaneous superficialis 08/18/2021   Rosacea 08/18/2021   Actinic keratosis 08/18/2021   Sensorineural hearing loss (SNHL) of left ear with unrestricted hearing of right ear 07/26/2021   Tinnitus of left ear 07/26/2021   Acute recurrent maxillary sinusitis 06/22/2021   Primary hypertension 01/12/2021   Hyperlipidemia associated with type 2 diabetes mellitus (HCC) 01/12/2021   Arthritis 01/12/2021   Type II diabetes mellitus (HCC) 11/25/2019   Ingrown toenail 09/05/2018   Neck pain 01/29/2018   Tick bite 10/24/2017    PCP: [name], [name]  REFERRING PROVIDER:[name], MD  REFERRING DIAG: s/p right reverse TSA with complications  THERAPY DIAG:  Muscle weakness (generalized)  Chronic right shoulder pain  Stiffness of right shoulder, not elsewhere classified  Cervicalgia  Spasm  Rationale for Evaluation and Treatment: Rehabilitation  ONSET DATE: 01/01/24  SUBJECTIVE:  SUBJECTIVE STATEMENT:  I was very busy over the weekend, I am tired and sore, I will see Dr. Joane tomorrow about the left shoulder  Evaluation:  Patient had a fall on her deck, tripped on a board that was sticking up and had a severe fracture of the right proximal humerus, 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 We saw her in PT for many visits.  She had good progress but during a very busy time from Thanksgiving to [place] Years she started having pain and some regression of motion.  She has had x-rays, bone scan and a CT.  She saw [name] after the CT.  Feels like it could be an infection, PT vs a revision.   11/30/23 Patient reports  that she saw the surgeon she is frustrated and upset as she feels that she still has no answers, it is felt that there is no loosening and no infection.  She is still hurting with basic ADL's, her AROM decreased beginning in March, as she started having increased pain in mid January after a very busy and active few month, she has an order for the left shoulder and the okay for us  to see her for her right shoulder for another month PAIN:  Are you having pain? Yes: NPRS scale: 7/10 Pain location: right shoulder, HA, left shoulder pain up to 8/10 with motions Pain description: dull ache at rest, sharp with motions Aggravating factors: quick motions, movements pain up to 10/10 Relieving factors: ice, rest, Tylenol  at best 3/10  PRECAUTIONS: none  RED FLAGS: None   WEIGHT BEARING RESTRICTIONS: No  FALLS:  Has patient fallen in last 6 months? no  LIVING ENVIRONMENT: Lives with: lives alone Lives in: House/apartment Stairs: No Has following equipment at home: None  OCCUPATION: retired  PLOF: Independent, very active with grandkids, does 5 classes a week at [employer]  PATIENT GOALS:dress without difficulty, do hair, have good ROM and less pain  NEXT MD VISIT: Mid November  OBJECTIVE:   DIAGNOSTIC FINDINGS:  See above  PATIENT SURVEYS:    COGNITION: Overall cognitive status: Within functional limits for tasks assessed     SENSATION: WFL  POSTURE: Fwd head, rounded shoulders, elevated and guarded shoulder  UPPER EXTREMITY ROM:   Active ROM Right AROM  eval Left AROM Eval                    AROM 11/30/23  Shoulder flexion 105 125                    R 120  Shoulder extension                        Shoulder abduction 85 100                    Right 100  Shoulder adduction                        Shoulder internal rotation 0 30                    Right 28  Shoulder external rotation 46 65  j                    Right 60  Elbow flexion                        Elbow extension                        Wrist flexion                        Wrist extension                        Wrist ulnar deviation                        Wrist radial deviation                        Wrist pronation                        Wrist supination                        (Blank rows = not tested)  UPPER EXTREMITY MMT:  In the available ROM  MMT Right eval Left eval  Shoulder flexion 4- 4-  Shoulder extension    Shoulder abduction    Shoulder adduction    Shoulder internal rotation 4+ 4+  Shoulder external rotation 4-P! 4  Middle trapezius    Lower trapezius    Elbow flexion    Elbow extension    Wrist flexion    Wrist extension    Wrist ulnar deviation    Wrist radial deviation    Wrist pronation    Wrist supination    Grip strength (lbs)    (Blank rows = not tested)  PALPATION:  Very tight and tender in the pectoral, upper trap, the entire right upper arm, slight warmth in the right anterior lateral arm  Positive impingement on the right   TODAY'S TREATMENT:                                                                                                                                         DATE:  02/06/24 UBE level 4 x 6  minutes Rows 10# 2x10 Lats 15# 2x10 Wall slides, circles Doorway stretch PAssive stretch to the right shoulder all motions and then the left shoulder all motions STM to the right upper trap and rhomboid and teres Vaso low pressure 34 degrees  02/02/24 UBE level 4 x 6 minutes 5# rows 15# lats Wall slides 1# circles 1# overhead side to side Wall ball isometric circles Passive stretch to the right shoulder all motions to discomfort STM to  the right upper trap and neck Vaso 34 degrees medium pressure  01/31/24 UBE LEvel 3 x 5 minutes 10# rows 10# Lats Wall slides Wall slides 2# supine punches and isometric circles 1# ER/IR Yellow tband ER/IR 1# supine bar flexion Passive stretch to the bilateral shoulders all motions Vaso medium pressure 34 degrees right shouldet  01/15/24 Vaso bilateral shoulders medium pressure 34 degrees   PATIENT EDUCATION: Education details: Dealer educated: Patient Education method: Programmer, multimedia, Facilities manager, Actor cues, Verbal cues, and Handouts Education comprehension: verbalized understanding  HOME EXERCISE PROGRAM:  ASSESSMENT:  CLINICAL IMPRESSION:  Patient describes the right shoulder always hurts, the left shoulder hurts with motions, She has been very busy over the week and is hurting a little more.  With PROM she has difficulty relaxing but is painful but does have good passive motions.  She will see Dr. Joane tomorrow for the left shoulder and hopes to get the shoulder injected as it has provided good relief in the past.  She has moved her appointment up with [name] about the right shoulder to the end of the month  Evaluation:  Patient is very frustrated, she had the right reverse TSA last July.  She did very well until around Christmas, then she really started having increased pain and lost some ROM.  She has had blood work done, US , bone scan and CT performed.  There has been some talk of a possible infection, MD feels like she  may benefit from a revision.  She would like to try PT again and the MD agreed.  She has lost ROM since I last saw her, she also is having some significant left shoulder pain from over using it.   July 2025: Patient saw surgeon, the bone scan said did not feel that there was infection or loosening however the surgeon is not so sure, he wants to do an US  to be sure, that is scheduled for the next few weeks, since she has not been to PT in a month I re-measured her ROM, she has lost at least 15 degrees of motion and more for all motions.  She is also having more pain in the left shoulder possibly from overdoing it, because she is trying to not use the right shoulder.    OBJECTIVE IMPAIRMENTS: cardiopulmonary status limiting activity, decreased activity tolerance, decreased endurance, decreased ROM, decreased strength, increased edema, increased muscle spasms, impaired flexibility, impaired UE functional use, improper body mechanics, postural dysfunction, and pain.  REHAB POTENTIAL: Good  CLINICAL DECISION MAKING: Evolving/moderate complexity  EVALUATION COMPLEXITY: Low   GOALS: Goals reviewed with patient? Yes  SHORT TERM GOALS: Target date: 01/31/24  Independent with initial HEP Goal status: met 01/31/24  LONG TERM GOALS: Target date: 04/15/24  Decrease pain 50% Goal status: ongoing 02/06/24 2.  Dress without difficulty Goal status: ongoing 02/06/24 3.  Do hair without difficulty Goal status:  4.  Increase AROM right shoulder flexion to 130 degrees Goal status:  5.  Increase right shoulder ER to 60 degrees Goal status:   6.  Return to water  aerobics and or gym activity Goal status:  PLAN:  PT FREQUENCY: 1-2x/week  PT DURATION: 12 weeks  PLANNED INTERVENTIONS: Therapeutic exercises, Therapeutic activity, Neuromuscular re-education, Balance training, Gait training, Patient/Family education, Self Care, Joint mobilization, Dry Needling, Electrical stimulation, Cryotherapy,  Vasopneumatic device, and Manual therapy  PLAN FOR NEXT SESSION: Try to regain some of her ROM and function, treat pain as needed  02/06/2024, 8:00 AM [facility]. [place], [place], [address] Phone: [phone]   Fax:  [phone]

## 2024-02-07 ENCOUNTER — Encounter: Payer: Self-pay | Admitting: Family Medicine

## 2024-02-07 ENCOUNTER — Ambulatory Visit (INDEPENDENT_AMBULATORY_CARE_PROVIDER_SITE_OTHER): Admitting: Family Medicine

## 2024-02-07 ENCOUNTER — Ambulatory Visit: Admitting: Family Medicine

## 2024-02-07 ENCOUNTER — Ambulatory Visit: Payer: Self-pay

## 2024-02-07 VITALS — BP 135/59 | HR 94 | Ht 61.0 in | Wt 204.0 lb

## 2024-02-07 VITALS — BP 146/82 | HR 86 | Ht 61.0 in | Wt 203.0 lb

## 2024-02-07 DIAGNOSIS — I700 Atherosclerosis of aorta: Secondary | ICD-10-CM

## 2024-02-07 DIAGNOSIS — K219 Gastro-esophageal reflux disease without esophagitis: Secondary | ICD-10-CM

## 2024-02-07 DIAGNOSIS — E785 Hyperlipidemia, unspecified: Secondary | ICD-10-CM

## 2024-02-07 DIAGNOSIS — G8929 Other chronic pain: Secondary | ICD-10-CM

## 2024-02-07 DIAGNOSIS — Z0000 Encounter for general adult medical examination without abnormal findings: Secondary | ICD-10-CM

## 2024-02-07 DIAGNOSIS — I10 Essential (primary) hypertension: Secondary | ICD-10-CM

## 2024-02-07 DIAGNOSIS — M1990 Unspecified osteoarthritis, unspecified site: Secondary | ICD-10-CM

## 2024-02-07 DIAGNOSIS — E1169 Type 2 diabetes mellitus with other specified complication: Secondary | ICD-10-CM | POA: Diagnosis not present

## 2024-02-07 DIAGNOSIS — Z23 Encounter for immunization: Secondary | ICD-10-CM

## 2024-02-07 DIAGNOSIS — M25512 Pain in left shoulder: Secondary | ICD-10-CM | POA: Diagnosis not present

## 2024-02-07 DIAGNOSIS — E1165 Type 2 diabetes mellitus with hyperglycemia: Secondary | ICD-10-CM | POA: Diagnosis not present

## 2024-02-07 DIAGNOSIS — M25511 Pain in right shoulder: Secondary | ICD-10-CM

## 2024-02-07 DIAGNOSIS — Z7985 Long-term (current) use of injectable non-insulin antidiabetic drugs: Secondary | ICD-10-CM

## 2024-02-07 NOTE — Assessment & Plan Note (Signed)
 Incidental finding of aortic atherosclerosis on CT scan. On atorvastatin  and baby aspirin. - Follow up with cardiologist as scheduled

## 2024-02-07 NOTE — Assessment & Plan Note (Signed)
 Medication management: atorvastatin 40 mg daily  Lifestyle factors for lowering cholesterol include: Diet therapy - heart-healthy diet rich in fruits, veggies, fiber-rich whole grains, lean meats, chicken, fish (at least twice a week), fat-free or 1% dairy products; foods low in saturated/trans fats, cholesterol, sodium, and sugar. Mediterranean diet has shown to be very heart healthy. Regular exercise - recommend at least 30 minutes a day, 5 times per week Weight management

## 2024-02-07 NOTE — Assessment & Plan Note (Signed)
 Type 2 diabetes mellitus with recent increase in HbA1c to 7.3% from a previous low of 6.7%. Resistant to insulin  therapy due to past negative experiences. Noted weight gain in the last 4-5 weeks. - Continue Ozempic  2 mg weekly. - Continue Synjardy  1 tablet twice daily. - Recheck HbA1c in December. - Monitor blood sugar levels regularly.

## 2024-02-07 NOTE — Patient Instructions (Addendum)
 Thank you for coming in today.   You received an injection today. Seek immediate medical attention if the joint becomes red, extremely painful, or is oozing fluid.   See you back as needed.

## 2024-02-07 NOTE — Progress Notes (Signed)
 Established Patient Office Visit  Subjective   Patient ID: Veronica Galvan, female    DOB: [birth year]  Age: 68 y.o. MRN: [id number]  Chief Complaint  Patient presents with   Establish Care    HPI   Discussed the use of AI scribe software for clinical note transcription with the patient, who gave verbal consent to proceed.   History of Present Illness Veronica Galvan is a 68 year old female with diabetes who presents for evaluation of shoulder pain and diabetes management.  She has ongoing issues with her left shoulder, which underwent total replacement in July 2024 following a fall that resulted in a fracture. Initially, she was doing well post-surgery, but around February 2025, she noticed a decline in her condition despite ongoing physical therapy. Diagnostic tests including blood work, a bone scan, and an ultrasound did not reveal any significant findings. A CT scan performed in June 2025 also did not show any new issues with the shoulder but did reveal aortic atherosclerosis. She is currently on atorvastatin  and baby aspirin, and she has an upcoming cardiology appointment.   She was told by Dr. Joane that she likely had bursitis in her left shoulder, possibly due to overuse. She has been undergoing physical therapy but experiences persistent pain and burning sensations.  Her blood sugar levels have been trending upwards recently. Her hemoglobin A1c was 7.3% in August 2025, up from 6.7% in September 2024. She is currently on Ozempic  2 mg weekly and Synjardy  1 tablet twice daily. She wants to avoid insulin  therapy due to a negative past experience. She has also gained weight in the past month, which she finds concerning.  She has a history of sinus issues, which have been severe enough to require prednisone  treatment. She underwent extensive evaluations including a brain scan, dental check for TMJ, allergy  testing, ENT scoping, and a neurology consultation, all of which were  unremarkable. Physical therapy, including dry needling, has helped alleviate her headaches.  She also has osteoarthritis, particularly affecting her hands, and is under the care of a rheumatologist. She is on Celebrex  for this condition and is monitored for kidney function. She has previously benefited from occupational therapy for her hands and is seeking a new referral for further therapy due to recurring symptoms.  Her medication regimen includes lisinopril  5 mg twice daily, fish oil, omeprazole  for a longstanding ulcer, and various vitamins and supplements. She is no longer allergic to penicillin, which was confirmed by an allergist.           ROS All review of systems negative except what is listed in the HPI    Objective:     BP (!) 135/59   Pulse 94   Ht 5' 1 (1.549 m)   Wt 204 lb (92.5 kg)   SpO2 98%   BMI 38.55 kg/m    Physical Exam Vitals reviewed.  Constitutional:      Appearance: Normal appearance.  Cardiovascular:     Rate and Rhythm: Normal rate and regular rhythm.     Heart sounds: Normal heart sounds.  Pulmonary:     Effort: Pulmonary effort is normal.     Breath sounds: Normal breath sounds.  Skin:    General: Skin is warm and dry.  Neurological:     Mental Status: She is alert and oriented to person, place, and time.  Psychiatric:        Mood and Affect: Mood normal.        Behavior:  Behavior normal.        Thought Content: Thought content normal.        Judgment: Judgment normal.           No results found for any visits on 02/07/24.    The ASCVD Risk score (Arnett DK, et al., 2019) failed to calculate for the following reasons:   The valid total cholesterol range is 130 to 320 mg/dL    Assessment & Plan:   Problem List Items Addressed This Visit       Active Problems   Type II diabetes mellitus (HCC) - Primary   Type 2 diabetes mellitus with recent increase in HbA1c to 7.3% from a previous low of 6.7%. Resistant to insulin   therapy due to past negative experiences. Noted weight gain in the last 4-5 weeks. - Continue Ozempic  2 mg weekly. - Continue Synjardy  1 tablet twice daily. - Recheck HbA1c in December. - Monitor blood sugar levels regularly.      Primary hypertension   Blood pressure is at goal for age and co-morbidities.   Recommendations: lisinopril  5 mg daily - BP goal <130/80 - monitor and log blood pressures at home - check around the same time each day in a relaxed setting - Limit salt to <2000 mg/day - Follow DASH eating plan (heart healthy diet) - limit alcohol to 2 standard drinks per day for men and 1 per day for women - avoid tobacco products - get at least 2 hours of regular aerobic exercise weekly Patient aware of signs/symptoms requiring further/urgent evaluation.        Hyperlipidemia associated with type 2 diabetes mellitus (HCC)   Medication management: atorvastatin  40 mg daily Lifestyle factors for lowering cholesterol include: Diet therapy - heart-healthy diet rich in fruits, veggies, fiber-rich whole grains, lean meats, chicken, fish (at least twice a week), fat-free or 1% dairy products; foods low in saturated/trans fats, cholesterol, sodium, and sugar. Mediterranean diet has shown to be very heart healthy. 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 Previous occupational therapy was beneficial. - Order occupational therapy for hands       Relevant Orders   Ambulatory referral to Occupational Therapy   Gastroesophageal reflux disease   History of peptic ulcer managed with omeprazole . Symptoms controlled with current medication regimen. - Continue omeprazole  as prescribed.      Aortic atherosclerosis   Incidental finding of aortic atherosclerosis on CT scan. On atorvastatin  and baby aspirin. 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 Prefers to delay revision surgery until after the first of the year. - Follow up with orthopedic specialist on October 23rd. - Plan for potential revision surgery after the first of the year. - Patient to bring us  copy of office notes.       Other Visit Diagnoses       Encounter for medical examination to establish care         Immunization due       Relevant Orders   Flu vaccine HIGH DOSE PF(Fluzone Trivalent) (Completed)          Return in about 2 months (around 04/08/2024) for chronic disease management (mid-December).    [name] KATHEE Mon, NP

## 2024-02-07 NOTE — Progress Notes (Signed)
 I, Leotis Batter, CMA acting as a scribe for Artist Lloyd, MD.  Veronica Galvan is a 68 y.o. female who presents to [facility] today for cont'd L shoulder pain. Pt was last seen by Dr. Lloyd on 11/15/23 and was given a L subacromial steroid injection and advised to cont HEP and PT.  Today, pt reports about 3 weeks of relief with last steroid injection. Limited ROM, especially overhead. Sx worse with ER. Denies radiating pain into the arm, though the elbow will feel hyperextended at times. Denies n/t/w. Taking Tylenol  Arthritis prn as well as Turmeric. Takes Celebrex  for hand pain. Left shoulder pain seems to be more movement related. Referred back to PT for shoulder pain by [name], would like to know what PT should work on.   Dx imaging: 09/06/23 L shoulder XR   Pertinent review of systems: No fevers or chills  Relevant historical information: Chronic right shoulder pain after total shoulder replacement concerning for loosening or infection managed by [name]   Exam:  BP (!) 146/82   Pulse 86   Ht 5' 1 (1.549 m)   Wt 203 lb (92.1 kg)   SpO2 97%   BMI 38.36 kg/m  General: Well Developed, well nourished, and in no acute distress.   MSK: Left shoulder decreased range of motion pain with abduction.    Lab and Radiology Results  Procedure: Real-time Ultrasound Guided Injection of left shoulder glenohumeral joint posterior approach Device: Philips Affiniti 50G/GE Logiq Images permanently stored and available for review in PACS Verbal informed consent obtained.  Discussed risks and benefits of procedure. Warned about infection, bleeding, hyperglycemia damage to structures among others. Patient expresses understanding and agreement Time-out conducted.   Noted no overlying erythema, induration, or other signs of local infection.   Skin prepped in a sterile fashion.   Local anesthesia: Topical Ethyl chloride.   With sterile technique and under  real time ultrasound guidance: Grams of Kenalog  and 2 mL of Marcaine  injected into glenohumeral joint. Fluid seen entering the joint capsule.   Completed without difficulty   Pain immediately resolved suggesting accurate placement of the medication.   Advised to call if fevers/chills, erythema, induration, drainage, or persistent bleeding.   Images permanently stored and available for review in the ultrasound unit.  Impression: Technically successful ultrasound guided injection.        Assessment and Plan: 68 y.o. female with chronic left shoulder pain due to degenerative changes and rotator cuff tendinopathy.  Will try glenohumeral injection.  Previous injections were subacromial and worked okay but not great.  She will plan on having a revision of a shoulder replacement on the right side in the new year with [name].  The meantime she is attending physical therapy which I think will be helpful.  Working on scapular stabilizers and shoulder exercises that she can do without hurting too much are both a good idea with PT.   PDMP not reviewed this encounter. Orders Placed This Encounter  Procedures   US  LIMITED JOINT SPACE STRUCTURES UP LEFT(NO LINKED CHARGES)    Reason for Exam (SYMPTOM  OR DIAGNOSIS REQUIRED):   left shoulder pain    Preferred imaging location?:   [facility]   No orders of the defined types were placed in this encounter.    Discussed warning signs or symptoms. Please see discharge instructions. Patient expresses understanding.   The above documentation has been reviewed and is accurate and complete Javanna Patin  Joane, M.D.

## 2024-02-07 NOTE — Assessment & Plan Note (Signed)
 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 Prefers to delay revision surgery until after the first of the year. - Follow up with orthopedic specialist on October 23rd. - Plan for potential revision surgery after the first of the year. - Patient to bring us  copy of office notes.

## 2024-02-07 NOTE — Assessment & Plan Note (Signed)
 History of peptic ulcer managed with omeprazole . Symptoms controlled with current medication regimen. - Continue omeprazole  as prescribed.

## 2024-02-07 NOTE — Assessment & Plan Note (Signed)
 Blood pressure is at goal for age and co-morbidities.   Recommendations: lisinopril  5 mg daily - BP goal <130/80 - monitor and log blood pressures at home - check around the same time each day in a relaxed setting - Limit salt to <2000 mg/day - Follow DASH eating plan (heart healthy diet) - limit alcohol to 2 standard drinks per day for men and 1 per day for women - avoid tobacco products - get at least 2 hours of regular aerobic exercise weekly Patient aware of signs/symptoms requiring further/urgent evaluation.

## 2024-02-07 NOTE — Assessment & Plan Note (Signed)
 Osteoarthritis of the hands with significant pain affecting daily activities. Previous occupational therapy was beneficial. - Order occupational therapy for hands

## 2024-02-08 ENCOUNTER — Encounter: Payer: Self-pay | Admitting: Physical Therapy

## 2024-02-08 ENCOUNTER — Ambulatory Visit: Admitting: Physical Therapy

## 2024-02-08 DIAGNOSIS — M79641 Pain in right hand: Secondary | ICD-10-CM | POA: Diagnosis not present

## 2024-02-08 DIAGNOSIS — G8929 Other chronic pain: Secondary | ICD-10-CM | POA: Diagnosis not present

## 2024-02-08 DIAGNOSIS — M542 Cervicalgia: Secondary | ICD-10-CM | POA: Diagnosis not present

## 2024-02-08 DIAGNOSIS — M25611 Stiffness of right shoulder, not elsewhere classified: Secondary | ICD-10-CM

## 2024-02-08 DIAGNOSIS — M6281 Muscle weakness (generalized): Secondary | ICD-10-CM

## 2024-02-08 DIAGNOSIS — M25511 Pain in right shoulder: Secondary | ICD-10-CM | POA: Diagnosis not present

## 2024-02-08 DIAGNOSIS — M25642 Stiffness of left hand, not elsewhere classified: Secondary | ICD-10-CM | POA: Diagnosis not present

## 2024-02-08 DIAGNOSIS — R252 Cramp and spasm: Secondary | ICD-10-CM

## 2024-02-08 DIAGNOSIS — R278 Other lack of coordination: Secondary | ICD-10-CM | POA: Diagnosis not present

## 2024-02-08 DIAGNOSIS — M79642 Pain in left hand: Secondary | ICD-10-CM | POA: Diagnosis not present

## 2024-02-08 DIAGNOSIS — M25641 Stiffness of right hand, not elsewhere classified: Secondary | ICD-10-CM | POA: Diagnosis not present

## 2024-02-08 NOTE — Therapy (Signed)
 OUTPATIENT PHYSICAL THERAPY SHOULDER    Patient Name: Veronica Galvan MRN: [id number] DOB:1955-08-16, 68 y.o., female Today's Date: 02/08/2024  END OF SESSION:  PT End of Session - 02/08/24 0830     Visit Number 5    Date for Recertification  04/15/24    Authorization Type [employer] [place]    PT Start Time 0825    PT Stop Time 0930    PT Time Calculation (min) 65 min    Activity Tolerance Patient tolerated treatment well    Behavior During Therapy WFL for tasks assessed/performed           Past Medical History:  Diagnosis Date   Allergy     Anxiety    Arthritis    hands   Cataract    Diabetes mellitus without complication (HCC)    type 2   Family history of adverse reaction to anesthesia    son has malignant hyperthermia, daughter does not daughter recently had c section without problems   GERD (gastroesophageal reflux disease)    Headache    sinus   Hyperlipidemia    Hypertension    PONV (postoperative nausea and vomiting)    nausea only   Ulcer, stomach peptic yrs ago   Past Surgical History:  Procedure Laterality Date   APPENDECTOMY     both hells bone spur repair     both heels with metal clips   both shoulder rotator cuff repair     CESAREAN SECTION     x 1   COLONOSCOPY WITH PROPOFOL  N/A 09/07/2016   Procedure: COLONOSCOPY WITH PROPOFOL ;  Surgeon: Vicci Gladis POUR, MD;  Location: [facility];  Service: Endoscopy;  Laterality: N/A;   colonscopy  06/2011   polyps   ELBOW FRACTURE SURGERY Left    EYE SURGERY     FRACTURE SURGERY     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/10/2022   Procedure: REVERSE SHOULDER ARTHROPLASTY;  Surgeon: Melita Drivers, MD;  Location: [facility];  Service: Orthopedics;  Laterality: Right;  Please follow in room 6 if able   TUBAL LIGATION     VESICO-VAGINAL FISTULA REPAIR     Patient Active Problem List   Diagnosis Date Noted   Aortic atherosclerosis 02/07/2024   Chronic right shoulder pain 02/07/2024   Gastroesophageal  reflux disease 11/16/2021   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 08/18/2021   Dermatofibroma 08/18/2021   History of malignant neoplasm of skin 08/18/2021   Lentigo 08/18/2021   Melanocytic nevi of trunk 08/18/2021   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   Rosacea 08/18/2021   Actinic keratosis 08/18/2021   Sensorineural hearing loss (SNHL) of left ear with unrestricted hearing of right ear 07/26/2021   Tinnitus of left ear 07/26/2021   Acute recurrent maxillary sinusitis 06/22/2021   Primary hypertension 01/12/2021   Hyperlipidemia associated with type 2 diabetes mellitus (HCC) 01/12/2021   Arthritis 01/12/2021   Type II diabetes mellitus (HCC) 11/25/2019   Ingrown toenail 09/05/2018   Neck pain 01/29/2018   Tick bite 10/24/2017    PCP: [name], [name]  REFERRING PROVIDER:[name], MD  REFERRING DIAG: s/p right reverse TSA with complications  THERAPY DIAG:  Muscle weakness (generalized)  Chronic right shoulder pain  Stiffness of right shoulder, not elsewhere classified  Cervicalgia  Spasm  Rationale for Evaluation and Treatment: Rehabilitation  ONSET DATE: 01/01/24  SUBJECTIVE:  SUBJECTIVE STATEMENT:  Patient saw Dr. Joane yesterday and had the injection on the left shoulder.  She reports not much relief yet.  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 We saw her in PT for many visits.  She had good progress but during a very busy time from Thanksgiving to  Years she started having pain and some regression of motion.  She has had x-rays, bone scan and a CT.  She saw [name] after the CT.  Feels  like it could be an infection, PT vs a revision.   11/30/23 Patient reports that she saw the surgeon she is frustrated and upset as she feels that she still has no answers, it is felt that there is no loosening and no infection.  She is still hurting with basic ADL's, her AROM decreased beginning in March, as she started having increased pain in mid January after a very busy and active few month, she has an order for the left shoulder and the okay for us  to see her for her right shoulder for another month PAIN:  Are you having pain? Yes: NPRS scale: 7/10 Pain location: right shoulder, HA, left shoulder pain up to 8/10 with motions Pain description: dull ache at rest, sharp with motions Aggravating factors: quick motions, movements pain up to 10/10 Relieving factors: ice, rest, Tylenol  at best 3/10  PRECAUTIONS: none  RED FLAGS: None   WEIGHT BEARING RESTRICTIONS: No  FALLS:  Has patient fallen in last 6 months? no  LIVING ENVIRONMENT: Lives with: lives alone Lives in: House/apartment Stairs: No Has following equipment at home: None  OCCUPATION: retired  PLOF: Independent, very active with grandkids, does 5 classes a week [employer]  PATIENT GOALS:dress without difficulty, do hair, have good ROM and less pain  NEXT MD VISIT: Mid November  OBJECTIVE:   DIAGNOSTIC FINDINGS:  See above  PATIENT SURVEYS:    COGNITION: Overall cognitive status: Within functional limits for tasks assessed     SENSATION: WFL  POSTURE: Fwd head, rounded shoulders, elevated and guarded shoulder  UPPER EXTREMITY ROM:   Active ROM Right AROM  eval Left AROM Eval                    AROM 11/30/23  Shoulder flexion 105 125                    R 120  Shoulder extension                        Shoulder abduction 85 100                    Right 100  Shoulder adduction                        Shoulder internal rotation 0 30                    Right 28  Shoulder external rotation 46 65  j                    Right 60  Elbow flexion                        Elbow extension                        Wrist flexion                        Wrist extension                        Wrist ulnar deviation                        Wrist radial deviation                        Wrist pronation                        Wrist supination                        (Blank rows = not tested)  UPPER EXTREMITY MMT:  In the available ROM  MMT Right eval Left eval  Shoulder flexion 4- 4-  Shoulder extension    Shoulder abduction    Shoulder adduction    Shoulder internal rotation 4+ 4+  Shoulder external rotation 4-P! 4  Middle trapezius    Lower trapezius    Elbow flexion    Elbow extension    Wrist flexion    Wrist extension    Wrist ulnar deviation    Wrist radial deviation    Wrist pronation    Wrist supination    Grip strength (lbs)    (Blank rows = not tested)  PALPATION:  Very tight and tender in the pectoral, upper trap, the entire right upper arm, slight warmth in the right anterior lateral arm  Positive impingement on the right   TODAY'S TREATMENT:                                                                                                                                          DATE:  02/08/24 Avoided left shoulder activity due to the injection yesterday PAssive stretch to the right shoulder all motions 3# small chest press 3# serratus push 3# isometric circles 2# supine flexion 1# right ER/IR STM to the upper traps, rhomboids and into the neck Vaso low pressure 34 degrees  02/06/24 UBE level 4 x 6 minutes Rows 10# 2x10 Lats 15# 2x10 Wall slides, circles Doorway stretch PAssive stretch to the right shoulder all motions and then the left shoulder all motions STM to  the right upper trap and rhomboid and teres Vaso low pressure 34 degrees  02/02/24 UBE level 4 x 6 minutes 5# rows 15# lats Wall slides 1# circles 1# overhead side to side Wall ball isometric circles Passive stretch to the right shoulder all motions to discomfort STM to the right upper trap and neck Vaso 34 degrees medium pressure  01/31/24 UBE LEvel 3 x 5 minutes 10# rows 10# Lats Wall slides Wall slides 2# supine punches and isometric circles 1# ER/IR Yellow tband ER/IR 1# supine bar flexion Passive stretch to the bilateral shoulders all motions Vaso medium pressure 34 degrees right shouldet  01/15/24 Vaso bilateral shoulders medium pressure 34 degrees   PATIENT EDUCATION: Education details: Dealer educated: Patient Education method: Programmer, multimedia, Facilities manager, Actor cues, Verbal cues, and Handouts Education comprehension: verbalized understanding  HOME EXERCISE PROGRAM:  ASSESSMENT:  CLINICAL IMPRESSION:  Patient describes the right shoulder always hurts, the left shoulder hurts with motions, She got the injection in the left shoulder yesterday so we avoided exercises with the left shoulder.  I did add in some stability to the right shoulder in supine.  She continues to have pain and guarding with PROM.  She has moved her appointment up with [name] about the right shoulder to the end of the month  Evaluation:  Patient is  very frustrated, she had the right reverse TSA last July.  She did very well until around Christmas, then she really started having increased pain and lost some ROM.  She has had blood work done, US , bone scan and CT performed.  There has been some talk of a possible infection, MD feels like she may benefit from a revision.  She would like to try PT again and the MD agreed.  She has lost ROM since I last saw her, she also is having some significant left shoulder pain from over using it.   July 2025: Patient saw surgeon, the bone scan said did not feel that there was infection or loosening however the surgeon is not so sure, he wants to do an US  to be sure, that is scheduled for the next few weeks, since she has not been to PT in a month I re-measured her ROM, she has lost at least 15 degrees of motion and more for all motions.  She is also having more pain in the left shoulder possibly from overdoing it, because she is trying to not use the right shoulder.    OBJECTIVE IMPAIRMENTS: cardiopulmonary status limiting activity, decreased activity tolerance, decreased endurance, decreased ROM, decreased strength, increased edema, increased muscle spasms, impaired flexibility, impaired UE functional use, improper body mechanics, postural dysfunction, and pain.  REHAB POTENTIAL: Good  CLINICAL DECISION MAKING: Evolving/moderate complexity  EVALUATION COMPLEXITY: Low   GOALS: Goals reviewed with patient? Yes  SHORT TERM GOALS: Target date: 01/31/24  Independent with initial HEP Goal status: met 01/31/24  LONG TERM GOALS: Target date: 04/15/24  Decrease pain 50% Goal status: ongoing 02/06/24 2.  Dress without difficulty Goal status: ongoing 02/06/24 3.  Do hair without difficulty Goal status: ongoing 02/08/24 4.  Increase AROM right shoulder flexion to 130 degrees Goal status:  5.  Increase right shoulder ER to 60 degrees Goal status:   6.  Return to water  aerobics and or gym activity Goal  status:  PLAN:  PT FREQUENCY: 1-2x/week  PT DURATION: 12 weeks  PLANNED INTERVENTIONS: Therapeutic exercises, Therapeutic activity, Neuromuscular re-education, Balance training, Gait training, Patient/Family education, Self Care, Joint mobilization, Dry  Needling, Electrical stimulation, Cryotherapy, Vasopneumatic device, and Manual therapy  PLAN FOR NEXT SESSION: Try to regain some of her ROM and function, treat pain as needed  02/08/2024, 8:49 AM [place] [facility] [facility] [address] [facility]. [place], [place], [address] Phone: [phone]   Fax:  [phone]

## 2024-02-11 ENCOUNTER — Encounter: Payer: Self-pay | Admitting: Family Medicine

## 2024-02-13 ENCOUNTER — Ambulatory Visit: Admitting: Physical Therapy

## 2024-02-13 ENCOUNTER — Encounter: Payer: Self-pay | Admitting: Physical Therapy

## 2024-02-13 DIAGNOSIS — M79642 Pain in left hand: Secondary | ICD-10-CM | POA: Diagnosis not present

## 2024-02-13 DIAGNOSIS — M542 Cervicalgia: Secondary | ICD-10-CM | POA: Diagnosis not present

## 2024-02-13 DIAGNOSIS — M25511 Pain in right shoulder: Secondary | ICD-10-CM | POA: Diagnosis not present

## 2024-02-13 DIAGNOSIS — G8929 Other chronic pain: Secondary | ICD-10-CM | POA: Diagnosis not present

## 2024-02-13 DIAGNOSIS — M25611 Stiffness of right shoulder, not elsewhere classified: Secondary | ICD-10-CM

## 2024-02-13 DIAGNOSIS — M25641 Stiffness of right hand, not elsewhere classified: Secondary | ICD-10-CM | POA: Diagnosis not present

## 2024-02-13 DIAGNOSIS — M6281 Muscle weakness (generalized): Secondary | ICD-10-CM | POA: Diagnosis not present

## 2024-02-13 DIAGNOSIS — M25642 Stiffness of left hand, not elsewhere classified: Secondary | ICD-10-CM | POA: Diagnosis not present

## 2024-02-13 DIAGNOSIS — R278 Other lack of coordination: Secondary | ICD-10-CM | POA: Diagnosis not present

## 2024-02-13 DIAGNOSIS — R252 Cramp and spasm: Secondary | ICD-10-CM | POA: Diagnosis not present

## 2024-02-13 DIAGNOSIS — M79641 Pain in right hand: Secondary | ICD-10-CM | POA: Diagnosis not present

## 2024-02-13 NOTE — Therapy (Signed)
 OUTPATIENT PHYSICAL THERAPY SHOULDER    Patient Name: Veronica Galvan MRN: [id number] DOB:19-Nov-1955, 68 y.o., female Today's Date: 02/13/2024  END OF SESSION:  PT End of Session - 02/13/24 0804     Visit Number 6    Date for Recertification  04/15/24    Authorization Type [employer] [place]    PT Start Time 0800    PT Stop Time 0900    PT Time Calculation (min) 60 min    Activity Tolerance Patient tolerated treatment well    Behavior During Therapy WFL for tasks assessed/performed           Past Medical History:  Diagnosis Date   Allergy     Anxiety    Arthritis    hands   Cataract    Diabetes mellitus without complication (HCC)    type 2   Family history of adverse reaction to anesthesia    son has malignant hyperthermia, daughter does not daughter recently had c section without problems   GERD (gastroesophageal reflux disease)    Headache    sinus   Hyperlipidemia    Hypertension    PONV (postoperative nausea and vomiting)    nausea only   Ulcer, stomach peptic yrs ago   Past Surgical History:  Procedure Laterality Date   APPENDECTOMY     both hells bone spur repair     both heels with metal clips   both shoulder rotator cuff repair     CESAREAN SECTION     x 1   COLONOSCOPY WITH PROPOFOL  N/A 09/07/2016   Procedure: COLONOSCOPY WITH PROPOFOL ;  Surgeon: Vicci Gladis POUR, MD;  Location: [facility];  Service: Endoscopy;  Laterality: N/A;   colonscopy  06/2011   polyps   ELBOW FRACTURE SURGERY Left    EYE SURGERY     FRACTURE SURGERY     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/10/2022   Procedure: REVERSE SHOULDER ARTHROPLASTY;  Surgeon: Melita Drivers, MD;  Location: [facility];  Service: Orthopedics;  Laterality: Right;  Please follow in room 6 if able   TUBAL LIGATION     VESICO-VAGINAL FISTULA REPAIR     Patient Active Problem List   Diagnosis Date Noted   Aortic atherosclerosis 02/07/2024   Chronic right shoulder pain 02/07/2024   Gastroesophageal  reflux disease 11/16/2021   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 08/18/2021   Dermatofibroma 08/18/2021   History of malignant neoplasm of skin 08/18/2021   Lentigo 08/18/2021   Melanocytic nevi of trunk 08/18/2021   Nevus lipomatosus cutaneous superficialis 08/18/2021   Rosacea 08/18/2021   Actinic keratosis 08/18/2021   Sensorineural hearing loss (SNHL) of left ear with unrestricted hearing of right ear 07/26/2021   Tinnitus of left ear 07/26/2021   Acute recurrent maxillary sinusitis 06/22/2021   Primary hypertension 01/12/2021   Hyperlipidemia associated with type 2 diabetes mellitus (HCC) 01/12/2021   Arthritis 01/12/2021   Type II diabetes mellitus (HCC) 11/25/2019   Ingrown toenail 09/05/2018   Neck pain 01/29/2018   Tick bite 10/24/2017    PCP: [name], [name]  REFERRING PROVIDER:[name], MD  REFERRING DIAG: s/p right reverse TSA with complications  THERAPY DIAG:  Muscle weakness (generalized)  Chronic right shoulder pain  Stiffness of right shoulder, not elsewhere classified  Cervicalgia  Spasm  Rationale for Evaluation and Treatment: Rehabilitation  ONSET DATE: 01/01/24  SUBJECTIVE:  SUBJECTIVE STATEMENT:  Reports that the injection really did help after 2 days  Evaluation:  Patient had a fall on her deck, tripped on a board that was sticking up and had a severe fracture of the right proximal humerus, 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 We saw her in PT for many visits.  She had good progress but during a very busy time from Thanksgiving to [place] Years she started having pain and some regression of motion.  She has had x-rays, bone scan and a CT.  She saw [name] after the CT.  Feels like it could be an infection, PT vs a  revision.   11/30/23 Patient reports that she saw the surgeon she is frustrated and upset as she feels that she still has no answers, it is felt that there is no loosening and no infection.  She is still hurting with basic ADL's, her AROM decreased beginning in March, as she started having increased pain in mid January after a very busy and active few month, she has an order for the left shoulder and the okay for us  to see her for her right shoulder for another month PAIN:  Are you having pain? Yes: NPRS scale: 7/10 Pain location: right shoulder, HA, left shoulder pain up to 8/10 with motions Pain description: dull ache at rest, sharp with motions Aggravating factors: quick motions, movements pain up to 10/10 Relieving factors: ice, rest, Tylenol  at best 3/10  PRECAUTIONS: none  RED FLAGS: None   WEIGHT BEARING RESTRICTIONS: No  FALLS:  Has patient fallen in last 6 months? no  LIVING ENVIRONMENT: Lives with: lives alone Lives in: House/apartment Stairs: No Has following equipment at home: None  OCCUPATION: retired  PLOF: Independent, very active with grandkids, does 5 classes a week at [employer]  PATIENT GOALS:dress without difficulty, do hair, have good ROM and less pain  NEXT MD VISIT: Mid November  OBJECTIVE:   DIAGNOSTIC FINDINGS:  See above  PATIENT SURVEYS:    COGNITION: Overall cognitive status: Within functional limits for tasks assessed     SENSATION: WFL  POSTURE: Fwd head, rounded shoulders, elevated and guarded shoulder  UPPER EXTREMITY ROM:   Active ROM Right AROM  eval Left AROM Eval                    AROM 11/30/23  Shoulder flexion 105 125                    R 120  Shoulder extension                        Shoulder abduction 85 100                    Right 100  Shoulder adduction                        Shoulder internal rotation 0 30                    Right 28  Shoulder external rotation 46 65  j                    Right 60  Elbow flexion                        Elbow extension                        Wrist flexion                        Wrist extension                        Wrist ulnar deviation                        Wrist radial deviation                        Wrist pronation                        Wrist supination                        (Blank rows = not tested)  UPPER EXTREMITY MMT:  In the available ROM  MMT Right eval Left eval  Shoulder flexion 4- 4-  Shoulder extension    Shoulder abduction    Shoulder adduction    Shoulder internal rotation 4+ 4+  Shoulder external rotation 4-P! 4  Middle trapezius    Lower trapezius    Elbow flexion    Elbow extension    Wrist flexion    Wrist extension    Wrist ulnar deviation    Wrist radial deviation    Wrist pronation    Wrist supination    Grip strength (lbs)    (Blank rows = not tested)  PALPATION:  Very tight and tender in the pectoral, upper trap, the entire right upper arm, slight warmth in the right anterior lateral arm  Positive impingement on the right   TODAY'S TREATMENT:                                                                                                                                          DATE:  02/13/24 15# rows 2x12 15# lats 2x12 5# chest press 2x12 3# wate bar extension 2# wall slides and circles 10# biceps 20# triceps 5# straight arm pulls Doorway stretch Passive right shoulder stretch all motions  02/08/24 Avoided left shoulder activity due to the injection yesterday PAssive stretch to the right shoulder all motions 3# small chest press 3# serratus push 3# isometric circles 2# supine flexion 1# right ER/IR STM to the upper traps, rhomboids and into the neck Vaso low pressure 34  degrees  02/06/24 UBE level 4 x 6 minutes Rows 10# 2x10 Lats 15# 2x10 Wall slides, circles Doorway stretch PAssive stretch to the right shoulder all motions and then the left shoulder all motions STM to the right upper trap and rhomboid and teres Vaso low pressure 34 degrees  02/02/24 UBE level 4 x 6 minutes 5# rows 15# lats Wall slides 1# circles 1# overhead side to side Wall ball isometric circles Passive stretch to the right shoulder all motions to discomfort STM to the right upper trap and neck Vaso 34 degrees medium pressure  01/31/24 UBE LEvel 3 x 5 minutes 10# rows 10# Lats Wall slides Wall slides 2# supine punches and isometric circles 1# ER/IR Yellow tband ER/IR 1# supine bar flexion Passive stretch to the bilateral shoulders all motions Vaso medium pressure 34 degrees right shouldet  01/15/24 Vaso bilateral shoulders medium pressure 34 degrees   PATIENT EDUCATION: Education details: poc Person educated: Patient Education method: Programmer, multimedia, Facilities manager, Actor cues, Verbal cues, and Handouts Education comprehension: verbalized understanding  HOME EXERCISE PROGRAM:  ASSESSMENT:  CLINICAL IMPRESSION:  Patient reports that the shoulder started to feel better after the injection on Saturday, we focused on strength and functional ROM today with pain and tightness. She has moved her appointment up with [name] about the right shoulder to the  end of the month  Evaluation:  Patient is very frustrated, she had the right reverse TSA last July.  She did very well until around Christmas, then she really started having increased pain and lost some ROM.  She has had blood work done, US , bone scan and CT performed.  There has been some talk of a possible infection, MD feels like she may benefit from a revision.  She would like to try PT again and the MD agreed.  She has lost ROM since I last saw her, she also is having some significant left shoulder pain from over using it.   July 2025: Patient saw surgeon, the bone scan said did not feel that there was infection or loosening however the surgeon is not so sure, he wants to do an US  to be sure, that is scheduled for the next few weeks, since she has not been to PT in a month I re-measured her ROM, she has lost at least 15 degrees of motion and more for all motions.  She is also having more pain in the left shoulder possibly from overdoing it, because she is trying to not use the right shoulder.    OBJECTIVE IMPAIRMENTS: cardiopulmonary status limiting activity, decreased activity tolerance, decreased endurance, decreased ROM, decreased strength, increased edema, increased muscle spasms, impaired flexibility, impaired UE functional use, improper body mechanics, postural dysfunction, and pain.  REHAB POTENTIAL: Good  CLINICAL DECISION MAKING: Evolving/moderate complexity  EVALUATION COMPLEXITY: Low   GOALS: Goals reviewed with patient? Yes  SHORT TERM GOALS: Target date: 01/31/24  Independent with initial HEP Goal status: met 01/31/24  LONG TERM GOALS: Target date: 04/15/24  Decrease pain 50% Goal status: ongoing 02/06/24 2.  Dress without difficulty Goal status: ongoing 02/06/24 3.  Do hair without difficulty Goal status: ongoing 02/08/24 4.  Increase AROM right shoulder flexion to 130 degrees Goal status:  5.  Increase right shoulder ER to 60 degrees Goal status:   6.  Return to  water  aerobics and or gym activity Goal status:  PLAN:  PT FREQUENCY: 1-2x/week  PT DURATION: 12 weeks  PLANNED INTERVENTIONS: Therapeutic exercises, Therapeutic activity, Neuromuscular re-education, Balance training,  Gait training, Patient/Family education, Self Care, Joint mobilization, Dry Needling, Electrical stimulation, Cryotherapy, Vasopneumatic device, and Manual therapy  PLAN FOR NEXT SESSION: Try to regain some of her ROM and function, treat pain as needed  02/13/2024, 8:20 AM [place] [facility] at [facility] [address] [facility]. [place], [place], [address] Phone: [phone]   Fax:  [phone]

## 2024-02-14 DIAGNOSIS — H35371 Puckering of macula, right eye: Secondary | ICD-10-CM | POA: Diagnosis not present

## 2024-02-14 DIAGNOSIS — H18513 Endothelial corneal dystrophy, bilateral: Secondary | ICD-10-CM | POA: Diagnosis not present

## 2024-02-14 DIAGNOSIS — H04123 Dry eye syndrome of bilateral lacrimal glands: Secondary | ICD-10-CM | POA: Diagnosis not present

## 2024-02-15 ENCOUNTER — Ambulatory Visit: Attending: Cardiology | Admitting: Cardiology

## 2024-02-15 ENCOUNTER — Encounter: Payer: Self-pay | Admitting: Cardiology

## 2024-02-15 ENCOUNTER — Encounter: Payer: Self-pay | Admitting: Physical Therapy

## 2024-02-15 ENCOUNTER — Ambulatory Visit: Admitting: Physical Therapy

## 2024-02-15 VITALS — BP 138/74 | HR 107 | Ht 61.0 in | Wt 197.0 lb

## 2024-02-15 DIAGNOSIS — M79642 Pain in left hand: Secondary | ICD-10-CM | POA: Diagnosis not present

## 2024-02-15 DIAGNOSIS — I700 Atherosclerosis of aorta: Secondary | ICD-10-CM

## 2024-02-15 DIAGNOSIS — M25511 Pain in right shoulder: Secondary | ICD-10-CM | POA: Diagnosis not present

## 2024-02-15 DIAGNOSIS — M6281 Muscle weakness (generalized): Secondary | ICD-10-CM | POA: Diagnosis not present

## 2024-02-15 DIAGNOSIS — M25641 Stiffness of right hand, not elsewhere classified: Secondary | ICD-10-CM | POA: Diagnosis not present

## 2024-02-15 DIAGNOSIS — R252 Cramp and spasm: Secondary | ICD-10-CM | POA: Diagnosis not present

## 2024-02-15 DIAGNOSIS — M542 Cervicalgia: Secondary | ICD-10-CM | POA: Diagnosis not present

## 2024-02-15 DIAGNOSIS — I10 Essential (primary) hypertension: Secondary | ICD-10-CM | POA: Diagnosis not present

## 2024-02-15 DIAGNOSIS — M79641 Pain in right hand: Secondary | ICD-10-CM | POA: Diagnosis not present

## 2024-02-15 DIAGNOSIS — M25642 Stiffness of left hand, not elsewhere classified: Secondary | ICD-10-CM | POA: Diagnosis not present

## 2024-02-15 DIAGNOSIS — G8929 Other chronic pain: Secondary | ICD-10-CM

## 2024-02-15 DIAGNOSIS — M25611 Stiffness of right shoulder, not elsewhere classified: Secondary | ICD-10-CM | POA: Diagnosis not present

## 2024-02-15 DIAGNOSIS — R278 Other lack of coordination: Secondary | ICD-10-CM | POA: Diagnosis not present

## 2024-02-15 NOTE — Therapy (Signed)
 OUTPATIENT PHYSICAL THERAPY SHOULDER    Patient Name: Veronica Galvan MRN: [id number] DOB:[birth year], 68 y.o., female Today's Date: 02/15/2024  END OF SESSION:  PT End of Session - 02/15/24 1416     Visit Number 7    Date for Recertification  04/15/24    Authorization Type [employer] [place]    PT Start Time 1400    PT Stop Time 1500    PT Time Calculation (min) 60 min    Activity Tolerance Patient tolerated treatment well    Behavior During Therapy WFL for tasks assessed/performed           Past Medical History:  Diagnosis Date   Allergy     Anxiety    Arthritis    hands   Cataract    Diabetes mellitus without complication (HCC)    type 2   Family history of adverse reaction to anesthesia    son has malignant hyperthermia, daughter does not daughter recently had c section without problems   GERD (gastroesophageal reflux disease)    Headache    sinus   Hyperlipidemia    Hypertension    PONV (postoperative nausea and vomiting)    nausea only   Ulcer, stomach peptic yrs ago   Past Surgical History:  Procedure Laterality Date   APPENDECTOMY     both hells bone spur repair     both heels with metal clips   both shoulder rotator cuff repair     CESAREAN SECTION     x 1   COLONOSCOPY WITH PROPOFOL  N/A 09/07/2016   Procedure: COLONOSCOPY WITH PROPOFOL ;  Surgeon: Vicci Gladis POUR, MD;  Location: [facility];  Service: Endoscopy;  Laterality: N/A;   colonscopy  06/2011   polyps   ELBOW FRACTURE SURGERY Left    EYE SURGERY     FRACTURE SURGERY     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/10/2022   Procedure: REVERSE SHOULDER ARTHROPLASTY;  Surgeon: Melita Drivers, MD;  Location: [facility];  Service: Orthopedics;  Laterality: Right;  Please follow in room 6 if able   TUBAL LIGATION     VESICO-VAGINAL FISTULA REPAIR     Patient Active Problem List   Diagnosis Date Noted   Aortic atherosclerosis 02/07/2024   Chronic right shoulder pain 02/07/2024   Gastroesophageal  reflux disease 11/16/2021   Asymptomatic varicose veins of bilateral lower extremities 08/18/2021   Dermatofibroma 08/18/2021   History of malignant neoplasm of skin 08/18/2021   Lentigo 08/18/2021   Melanocytic nevi of trunk 08/18/2021   Nevus lipomatosus cutaneous superficialis 08/18/2021   Rosacea 08/18/2021   Actinic keratosis 08/18/2021   Sensorineural hearing loss (SNHL) of left ear with unrestricted hearing of right ear 07/26/2021   Tinnitus of left ear 07/26/2021   Acute recurrent maxillary sinusitis 06/22/2021   Primary hypertension 01/12/2021   Hyperlipidemia associated with type 2 diabetes mellitus (HCC) 01/12/2021   Arthritis 01/12/2021   Type II diabetes mellitus (HCC) 11/25/2019   Ingrown toenail 09/05/2018   Neck pain 01/29/2018   Tick bite 10/24/2017    PCP: [name], [name]  REFERRING PROVIDER:[name], MD  REFERRING DIAG: s/p right reverse TSA with complications  THERAPY DIAG:  Muscle weakness (generalized)  Chronic right shoulder pain  Stiffness of right shoulder, not elsewhere classified  Rationale for Evaluation and Treatment: Rehabilitation  ONSET DATE: 01/01/24  SUBJECTIVE:  SUBJECTIVE STATEMENT:  Reports that she has been a little tight and sore  Evaluation:  Patient had a fall on her deck, tripped on a board that was sticking up and had a severe fracture of the right proximal humerus, 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 We saw her in PT for many visits.  She had good progress but during a very busy time from Thanksgiving to [place] Years she started having pain and some regression of motion.  She has had x-rays, bone scan and a CT.  She saw [name] after the CT.  Feels like it could be an infection, PT vs a revision.   11/30/23 Patient reports that she  saw the surgeon she is frustrated and upset as she feels that she still has no answers, it is felt that there is no loosening and no infection.  She is still hurting with basic ADL's, her AROM decreased beginning in March, as she started having increased pain in mid January after a very busy and active few month, she has an order for the left shoulder and the okay for us  to see her for her right shoulder for another month PAIN:  Are you having pain? Yes: NPRS scale: 7/10 Pain location: right shoulder, HA, left shoulder pain up to 8/10 with motions Pain description: dull ache at rest, sharp with motions Aggravating factors: quick motions, movements pain up to 10/10 Relieving factors: ice, rest, Tylenol  at best 3/10  PRECAUTIONS: none  RED FLAGS: None   WEIGHT BEARING RESTRICTIONS: No  FALLS:  Has patient fallen in last 6 months? no  LIVING ENVIRONMENT: Lives with: lives alone Lives in: House/apartment Stairs: No Has following equipment at home: None  OCCUPATION: retired  PLOF: Independent, very active with grandkids, does 5 classes a week at [employer]  PATIENT GOALS:dress without difficulty, do hair, have good ROM and less pain  NEXT MD VISIT: Mid November  OBJECTIVE:   DIAGNOSTIC FINDINGS:  See above  PATIENT SURVEYS:    COGNITION: Overall cognitive status: Within functional limits for tasks assessed     SENSATION: WFL  POSTURE: Fwd head, rounded shoulders, elevated and guarded shoulder  UPPER EXTREMITY ROM:   Active ROM Right AROM  eval Left AROM Eval                    AROM 11/30/23  Shoulder flexion 105 125                    R 120  Shoulder extension                        Shoulder abduction 85 100                    Right 100  Shoulder adduction                        Shoulder internal rotation 0 30                    Right 28  Shoulder external rotation 46 65  j                    Right 60  Elbow flexion                        Elbow extension                        Wrist flexion                        Wrist extension                        Wrist ulnar deviation                        Wrist radial deviation                        Wrist pronation                        Wrist supination                        (Blank rows = not tested)  UPPER EXTREMITY MMT:  In the available ROM  MMT Right eval Left eval  Shoulder flexion 4- 4-  Shoulder extension    Shoulder abduction    Shoulder adduction    Shoulder internal rotation 4+ 4+  Shoulder external rotation 4-P! 4  Middle trapezius    Lower trapezius    Elbow flexion    Elbow extension    Wrist flexion    Wrist extension    Wrist ulnar deviation    Wrist radial deviation    Wrist pronation    Wrist supination    Grip strength (lbs)    (Blank rows = not tested)  PALPATION:  Very tight and tender in the pectoral, upper trap, the entire right upper arm, slight warmth in the right anterior lateral arm  Positive impingement on the right   TODAY'S TREATMENT:                                                                                                                                         DATE:  02/15/24 UBE x 6 minutes Rows 15#   LAts 15# Chest press 5# Triceps 20# 5# biceps 2# wall  slides, circles, overhead side to side Doorway stretches Passive stretches right UE Vaso low pressure right shoulder  02/13/24 15# rows 2x12 15# lats 2x12 5# chest press 2x12 3# wate bar extension 2# wall slides and circles 10# biceps 20# triceps 5# straight arm pulls Doorway stretch Passive right shoulder stretch all motions  02/08/24 Avoided left shoulder activity due to the injection yesterday  PAssive stretch to the right shoulder all motions 3# small chest press 3# serratus push 3# isometric circles 2# supine flexion 1# right ER/IR STM to the upper traps, rhomboids and into the neck Vaso low pressure 34 degrees  02/06/24 UBE level 4 x 6 minutes Rows 10# 2x10 Lats 15# 2x10 Wall slides, circles Doorway stretch PAssive stretch to the right shoulder all motions and then the left shoulder all motions STM to the right upper trap and rhomboid and teres Vaso low pressure 34 degrees  02/02/24 UBE level 4 x 6 minutes 5# rows 15# lats Wall slides 1# circles 1# overhead side to side Wall ball isometric circles Passive stretch to the right shoulder all motions to discomfort STM to the right upper trap and neck Vaso 34 degrees medium pressure  PATIENT EDUCATION: Education details: poc Person educated: Patient Education method: Programmer, multimedia, Facilities manager, Actor cues, Verbal cues, and Handouts Education comprehension: verbalized understanding  HOME EXERCISE PROGRAM:  ASSESSMENT:  CLINICAL IMPRESSION:  Patient reports that the shoulder was pretty sore after Tuesday She reports that she feels the stretching is helping.  She remains active, we are trying to push ROM and functional strength  Evaluation:  Patient is very frustrated, she had the right reverse TSA last July.  She did very well until around Christmas, then she really started having increased pain and lost some ROM.  She has had blood work done, US ,  bone scan and CT performed.  There has been some talk of a possible infection, MD feels like she may benefit from a revision.  She would like to try PT again and the MD agreed.  She has lost ROM since I last saw her, she also is having some significant left shoulder pain from over using it.   July 2025: Patient saw surgeon, the bone scan said did not feel that there was infection or loosening however the surgeon is not so sure, he wants to do an US  to be sure, that is scheduled for the next few weeks, since she has not been to PT in a month I re-measured her ROM, she has lost at least 15 degrees of motion and more for all motions.  She is also having more pain in the left shoulder possibly from overdoing it, because she is trying to not use the right shoulder.    OBJECTIVE IMPAIRMENTS: cardiopulmonary status limiting activity, decreased activity tolerance, decreased endurance, decreased ROM, decreased strength, increased edema, increased muscle spasms, impaired flexibility, impaired UE functional use, improper body mechanics, postural dysfunction, and pain.  REHAB POTENTIAL: Good  CLINICAL DECISION MAKING: Evolving/moderate complexity  EVALUATION COMPLEXITY: Low   GOALS: Goals reviewed with patient? Yes  SHORT TERM GOALS: Target date: 01/31/24  Independent with initial HEP Goal status: met 01/31/24  LONG TERM GOALS: Target date: 04/15/24  Decrease pain 50% Goal status: ongoing 02/06/24 2.  Dress without difficulty Goal status: ongoing 02/06/24 3.  Do hair without difficulty Goal status: ongoing 02/08/24 4.  Increase AROM right shoulder flexion to 130 degrees Goal status:  5.  Increase right shoulder ER to 60 degrees Goal status: ongoing 02/15/24  6.  Return to water  aerobics and or gym activity Goal status: on going 02/15/24  PLAN:  PT FREQUENCY: 1-2x/week  PT DURATION: 12 weeks  PLANNED INTERVENTIONS: Therapeutic exercises, Therapeutic activity, Neuromuscular re-education,  Balance training, Gait training, Patient/Family education, Self Care, Joint mobilization, Dry Needling, Electrical stimulation, Cryotherapy, Vasopneumatic device, and Manual therapy  PLAN FOR NEXT SESSION: Try to regain some  of her ROM and function, treat pain as needed  02/15/2024, 2:18 PM [place] [facility] at [facility] [address] [facility]. [place], [place], [address] Phone: [phone]   Fax:  [phone]

## 2024-02-15 NOTE — Patient Instructions (Signed)
   Testing/Procedures:  CORONARY CALCIUM  SCORING CT SCAN AT [address] TODAY IF POSSIBLE  Follow-Up: At [facility], you and your health needs are our priority.  As part of our continuing mission to provide you with exceptional heart care, our providers are all part of one team.  This team includes your primary Cardiologist (physician) and Advanced Practice Providers or APPs (Physician Assistants and Nurse Practitioners) who all work together to provide you with the care you need, when you need it.  Your next appointment:   12 month(s)  Provider:   REDELL SHALLOW MD

## 2024-02-16 ENCOUNTER — Ambulatory Visit: Payer: Self-pay | Admitting: Cardiology

## 2024-02-16 ENCOUNTER — Ambulatory Visit (HOSPITAL_BASED_OUTPATIENT_CLINIC_OR_DEPARTMENT_OTHER)
Admission: RE | Admit: 2024-02-16 | Discharge: 2024-02-16 | Disposition: A | Discharge status: Home / Self Care | Payer: Self-pay | Source: Ambulatory Visit | Attending: Cardiology | Admitting: Cardiology

## 2024-02-16 DIAGNOSIS — I700 Atherosclerosis of aorta: Secondary | ICD-10-CM | POA: Insufficient documentation

## 2024-02-16 LAB — LIPID PANEL
Chol/HDL Ratio: 2.9 ratio (ref 0.0–4.4)
Cholesterol, Total: 145 mg/dL (ref 100–199)
HDL: 50 mg/dL (ref >39)
LDL Chol Calc (NIH): 55 mg/dL (ref 0–99)
Triglycerides: 255 mg/dL — ABNORMAL HIGH (ref 0–149)
VLDL Cholesterol Cal: 40 mg/dL (ref 5–40)

## 2024-02-20 ENCOUNTER — Encounter: Payer: Self-pay | Admitting: Physical Therapy

## 2024-02-20 ENCOUNTER — Ambulatory Visit: Admitting: Physical Therapy

## 2024-02-20 DIAGNOSIS — G8929 Other chronic pain: Secondary | ICD-10-CM

## 2024-02-20 DIAGNOSIS — R252 Cramp and spasm: Secondary | ICD-10-CM

## 2024-02-20 DIAGNOSIS — R278 Other lack of coordination: Secondary | ICD-10-CM | POA: Diagnosis not present

## 2024-02-20 DIAGNOSIS — M25642 Stiffness of left hand, not elsewhere classified: Secondary | ICD-10-CM | POA: Diagnosis not present

## 2024-02-20 DIAGNOSIS — M79641 Pain in right hand: Secondary | ICD-10-CM | POA: Diagnosis not present

## 2024-02-20 DIAGNOSIS — M6281 Muscle weakness (generalized): Secondary | ICD-10-CM | POA: Diagnosis not present

## 2024-02-20 DIAGNOSIS — M25611 Stiffness of right shoulder, not elsewhere classified: Secondary | ICD-10-CM | POA: Diagnosis not present

## 2024-02-20 DIAGNOSIS — M542 Cervicalgia: Secondary | ICD-10-CM | POA: Diagnosis not present

## 2024-02-20 DIAGNOSIS — M25511 Pain in right shoulder: Secondary | ICD-10-CM | POA: Diagnosis not present

## 2024-02-20 DIAGNOSIS — M79642 Pain in left hand: Secondary | ICD-10-CM | POA: Diagnosis not present

## 2024-02-20 DIAGNOSIS — M25641 Stiffness of right hand, not elsewhere classified: Secondary | ICD-10-CM | POA: Diagnosis not present

## 2024-02-20 NOTE — Therapy (Signed)
 OUTPATIENT PHYSICAL THERAPY SHOULDER    Patient Name: Veronica Galvan MRN: [id number] DOB:Aug 23, 1955, 68 y.o., female Today's Date: 02/20/2024  END OF SESSION:  PT End of Session - 02/20/24 1055     Visit Number 8    Date for Recertification  04/15/24    Authorization Type [employer] [place]    PT Start Time 1055    PT Stop Time 1155    PT Time Calculation (min) 60 min    Activity Tolerance Patient tolerated treatment well    Behavior During Therapy WFL for tasks assessed/performed           Past Medical History:  Diagnosis Date   Allergy     Anxiety    Arthritis    hands   Cataract    Diabetes mellitus without complication (HCC)    type 2   Family history of adverse reaction to anesthesia    son has malignant hyperthermia, daughter does not daughter recently had c section without problems   GERD (gastroesophageal reflux disease)    Headache    sinus   Hyperlipidemia    Hypertension    PONV (postoperative nausea and vomiting)    nausea only   Ulcer, stomach peptic yrs ago   Past Surgical History:  Procedure Laterality Date   APPENDECTOMY     both hells bone spur repair     both heels with metal clips   both shoulder rotator cuff repair     CESAREAN SECTION     x 1   COLONOSCOPY WITH PROPOFOL  N/A 09/07/2016   Procedure: COLONOSCOPY WITH PROPOFOL ;  Surgeon: Vicci Gladis POUR, MD;  Location: [facility];  Service: Endoscopy;  Laterality: N/A;   colonscopy  06/2011   polyps   ELBOW FRACTURE SURGERY Left    EYE SURGERY     FRACTURE SURGERY     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/10/2022   Procedure: REVERSE SHOULDER ARTHROPLASTY;  Surgeon: Melita Drivers, MD;  Location: [facility];  Service: Orthopedics;  Laterality: Right;  Please follow in room 6 if able   TUBAL LIGATION     VESICO-VAGINAL FISTULA REPAIR     Patient Active Problem List   Diagnosis Date Noted   Aortic atherosclerosis 02/07/2024   Chronic right shoulder pain 02/07/2024   Gastroesophageal  reflux disease 11/16/2021   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 08/18/2021   Dermatofibroma 08/18/2021   History of malignant neoplasm of skin 08/18/2021   Lentigo 08/18/2021   Melanocytic nevi of trunk 08/18/2021   Nevus lipomatosus cutaneous superficialis 08/18/2021   Rosacea 08/18/2021   Actinic keratosis 08/18/2021   Sensorineural hearing loss (SNHL) of left ear with unrestricted hearing of right ear 07/26/2021   Tinnitus of left ear 07/26/2021   Acute recurrent maxillary sinusitis 06/22/2021   Primary hypertension 01/12/2021   Hyperlipidemia associated with type 2 diabetes mellitus (HCC) 01/12/2021   Arthritis 01/12/2021   Type II diabetes mellitus (HCC) 11/25/2019   Ingrown toenail 09/05/2018   Neck pain 01/29/2018   Tick bite 10/24/2017    PCP: [name], [name]  REFERRING PROVIDER:[name], MD  REFERRING DIAG: s/p right reverse TSA with complications  THERAPY DIAG:  Muscle weakness (generalized)  Chronic right shoulder pain  Stiffness of right shoulder, not elsewhere classified  Cervicalgia  Spasm  Rationale for Evaluation and Treatment: Rehabilitation  ONSET DATE: 01/01/24  SUBJECTIVE:  SUBJECTIVE STATEMENT:  I made brownies and my arm really burns seems like I get fatigued really quick.  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 We saw her in PT for many visits.  She had good progress but during a very busy time from Thanksgiving to [place] Years she started having pain and some regression of motion.  She has had x-rays, bone scan and a CT.  She saw [name] after the CT.  Feels like it could be an infection,  PT vs a revision.   11/30/23 Patient reports that she saw the surgeon she is frustrated and upset as she feels that she still has no answers, it is felt that there is no loosening and no infection.  She is still hurting with basic ADL's, her AROM decreased beginning in March, as she started having increased pain in mid January after a very busy and active few month, she has an order for the left shoulder and the okay for us  to see her for her right shoulder for another month PAIN:  Are you having pain? Yes: NPRS scale: 7/10 Pain location: right shoulder, HA, left shoulder pain up to 8/10 with motions Pain description: dull ache at rest, sharp with motions Aggravating factors: quick motions, movements pain up to 10/10 Relieving factors: ice, rest, Tylenol  at best 3/10  PRECAUTIONS: none  RED FLAGS: None   WEIGHT BEARING RESTRICTIONS: No  FALLS:  Has patient fallen in last 6 months? no  LIVING ENVIRONMENT: Lives with: lives alone Lives in: House/apartment Stairs: No Has following equipment at home: None  OCCUPATION: retired  PLOF: Independent, very active with grandkids, does 5 [facility] a week [employer]  PATIENT GOALS:dress without difficulty, do hair, have good ROM and less pain  NEXT MD VISIT: Mid November  OBJECTIVE:   DIAGNOSTIC FINDINGS:  See above  PATIENT SURVEYS:    COGNITION: Overall cognitive status: Within functional limits for tasks assessed     SENSATION: WFL  POSTURE: Fwd head, rounded shoulders, elevated and guarded shoulder  UPPER EXTREMITY ROM:   Active ROM Right AROM  eval Left AROM Eval                    AROM 11/30/23  Shoulder flexion 105 125                    R 120  Shoulder extension                        Shoulder abduction 85 100                    Right 100  Shoulder adduction                        Shoulder internal rotation 0 30                    Right 28  Shoulder external rotation 46 65  j                    Right 60  Elbow flexion                        Elbow extension                        Wrist flexion                        Wrist extension                        Wrist ulnar deviation                        Wrist radial deviation                        Wrist pronation                        Wrist supination                        (Blank rows = not tested)  UPPER EXTREMITY MMT:  In the available ROM  MMT Right eval Left eval  Shoulder flexion 4- 4-  Shoulder extension    Shoulder abduction    Shoulder adduction    Shoulder internal rotation 4+ 4+  Shoulder external rotation 4-P! 4  Middle trapezius    Lower trapezius    Elbow flexion    Elbow extension    Wrist flexion    Wrist extension    Wrist ulnar deviation    Wrist radial deviation    Wrist pronation    Wrist supination    Grip strength (lbs)    (Blank rows = not tested)  PALPATION:  Very tight and tender in the pectoral, upper trap, the entire right upper arm, slight warmth in the right anterior lateral arm  Positive impingement on the right   TODAY'S TREATMENT:                                                                                                                                          DATE:  02/20/24 UBE level 5 x 6 minutes Rows 20# Lats 20# Biceps 5#  Triceps 25# Doorway stretch Passive stretch right shoulder all motions Finger ladder Vaso low pressure right shoulder  02/15/24 UBE x 6 minutes Rows 15#  LAts 15# Chest press 5# Triceps 20# 5# biceps 2# wall  slides, circles, overhead side to side Doorway stretches Passive stretches right UE Vaso low pressure right shoulder  02/13/24 15# rows 2x12 15# lats 2x12 5# chest press 2x12 3# wate bar extension  2# wall slides and circles 10# biceps 20# triceps 5# straight arm pulls Doorway stretch Passive right shoulder stretch all motions  02/08/24 Avoided left shoulder activity due to the injection yesterday PAssive stretch to the right shoulder all motions 3# small chest press 3# serratus push 3# isometric circles 2# supine flexion 1# right ER/IR STM to the upper traps, rhomboids and into the neck Vaso low pressure 34 degrees  02/06/24 UBE level 4 x 6 minutes Rows 10# 2x10 Lats 15# 2x10 Wall slides, circles Doorway stretch PAssive stretch to the right shoulder all motions and then the left shoulder all motions STM to the right upper trap and rhomboid and teres Vaso low pressure 34 degrees  02/02/24 UBE level 4 x 6 minutes 5# rows 15# lats Wall slides 1# circles 1# overhead side to side Wall ball isometric circles Passive stretch to the right shoulder all motions to discomfort STM to the right upper trap and neck Vaso 34 degrees medium pressure  PATIENT EDUCATION: Education details: poc Person educated: Patient Education method: Programmer, multimedia, Facilities manager, Actor cues, Verbal cues, and Handouts Education comprehension: verbalized understanding  HOME EXERCISE PROGRAM:  ASSESSMENT:  CLINICAL IMPRESSION:  Patient reports that the shoulder gets very tired with activity, like cooking stirring brownies etc...  She reports a burning in the mms, still limited behind back  activities but the ROM seems to be improving.  Evaluation:  Patient is very frustrated, she had the right reverse TSA last July.  She did very well until around Christmas, then she really started having increased pain and lost some ROM.  She has had blood work done, US , bone scan and CT performed.  There has been some talk of a possible infection, MD feels like she may benefit from a revision.  She would like to try PT again and the MD agreed.  She has lost ROM since I last saw her, she also is having some significant left shoulder pain from over using it.   July 2025: Patient saw surgeon, the bone scan said did not feel that there was infection or loosening however the surgeon is not so sure, he wants to do an US  to be sure, that is scheduled for the next few weeks, since she has not been to PT in a month I re-measured her ROM, she has lost at least 15 degrees of motion and more for all motions.  She is also having more pain in the left shoulder possibly from overdoing it, because she is trying to not use the right shoulder.    OBJECTIVE IMPAIRMENTS: cardiopulmonary status limiting activity, decreased activity tolerance, decreased endurance, decreased ROM, decreased strength, increased edema, increased muscle spasms, impaired flexibility, impaired UE functional use, improper body mechanics, postural dysfunction, and pain.  REHAB POTENTIAL: Good  CLINICAL DECISION MAKING: Evolving/moderate complexity  EVALUATION COMPLEXITY: Low   GOALS: Goals reviewed with patient? Yes  SHORT TERM GOALS: Target date: 01/31/24  Independent with initial HEP Goal status: met 01/31/24  LONG TERM GOALS: Target date: 04/15/24  Decrease pain 50% Goal status: ongoing 02/06/24 2.  Dress without difficulty Goal status: ongoing 02/06/24 3.  Do hair without difficulty Goal status: ongoing 02/08/24 4.  Increase AROM right shoulder flexion to 130 degrees Goal status:  5.  Increase right shoulder ER to 60 degrees Goal  status: ongoing 02/15/24  6.  Return to water  aerobics and or gym activity Goal status: on going 02/15/24  PLAN:  PT FREQUENCY: 1-2x/week  PT DURATION: 12 weeks  PLANNED INTERVENTIONS:  Therapeutic exercises, Therapeutic activity, Neuromuscular re-education, Balance training, Gait training, Patient/Family education, Self Care, Joint mobilization, Dry Needling, Electrical stimulation, Cryotherapy, Vasopneumatic device, and Manual therapy  PLAN FOR NEXT SESSION: Try to regain some of her ROM and function, treat pain as needed  02/20/2024, 10:56 AM [place] [facility] [address] [facility]. [place], [place], [address] Phone: [phone]   Fax:  [phone]

## 2024-02-21 ENCOUNTER — Ambulatory Visit: Admitting: Occupational Therapy

## 2024-02-21 DIAGNOSIS — R252 Cramp and spasm: Secondary | ICD-10-CM | POA: Diagnosis not present

## 2024-02-21 DIAGNOSIS — G8929 Other chronic pain: Secondary | ICD-10-CM | POA: Diagnosis not present

## 2024-02-21 DIAGNOSIS — M25611 Stiffness of right shoulder, not elsewhere classified: Secondary | ICD-10-CM | POA: Diagnosis not present

## 2024-02-21 DIAGNOSIS — R278 Other lack of coordination: Secondary | ICD-10-CM | POA: Diagnosis not present

## 2024-02-21 DIAGNOSIS — M542 Cervicalgia: Secondary | ICD-10-CM | POA: Diagnosis not present

## 2024-02-21 DIAGNOSIS — M6281 Muscle weakness (generalized): Secondary | ICD-10-CM | POA: Diagnosis not present

## 2024-02-21 DIAGNOSIS — M79642 Pain in left hand: Secondary | ICD-10-CM | POA: Diagnosis not present

## 2024-02-21 DIAGNOSIS — M25642 Stiffness of left hand, not elsewhere classified: Secondary | ICD-10-CM | POA: Diagnosis not present

## 2024-02-21 DIAGNOSIS — M25641 Stiffness of right hand, not elsewhere classified: Secondary | ICD-10-CM | POA: Diagnosis not present

## 2024-02-21 DIAGNOSIS — M79641 Pain in right hand: Secondary | ICD-10-CM

## 2024-02-21 DIAGNOSIS — M25511 Pain in right shoulder: Secondary | ICD-10-CM | POA: Diagnosis not present

## 2024-02-21 NOTE — Therapy (Unsigned)
 OUTPATIENT OCCUPATIONAL THERAPY ORTHO EVALUATION  Patient Name: Veronica Galvan MRN: [id number] DOB:19-Feb-1956, 68 y.o., female Today's Date: 02/22/2024  PCP: Almarie Birmingham, NP REFERRING PROVIDER: Almarie Birmingham, NP  END OF SESSION:  OT End of Session - 02/22/24 0808     Visit Number 1    Number of Visits 12    Date for Recertification  05/15/24    Authorization Type [employer] [place]    Authorization Time Period 12 weeks    OT Start Time 0804    OT Stop Time 0845    OT Time Calculation (min) 41 min          Past Medical History:  Diagnosis Date   Allergy     Anxiety    Arthritis    hands   Cataract    Diabetes mellitus without complication (HCC)    type 2   Family history of adverse reaction to anesthesia    son has malignant hyperthermia, daughter does not daughter recently had c section without problems   GERD (gastroesophageal reflux disease)    Headache    sinus   Hyperlipidemia    Hypertension    PONV (postoperative nausea and vomiting)    nausea only   Ulcer, stomach peptic yrs ago   Past Surgical History:  Procedure Laterality Date   APPENDECTOMY     both hells bone spur repair     both heels with metal clips   both shoulder rotator cuff repair     CESAREAN SECTION     x 1   COLONOSCOPY WITH PROPOFOL  N/A 09/07/2016   Procedure: COLONOSCOPY WITH PROPOFOL ;  Surgeon: Vicci Gladis POUR, MD;  Location: [facility];  Service: Endoscopy;  Laterality: N/A;   colonscopy  06/2011   polyps   ELBOW FRACTURE SURGERY Left    EYE SURGERY     FRACTURE SURGERY     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/10/2022   Procedure: REVERSE SHOULDER ARTHROPLASTY;  Surgeon: Melita Drivers, MD;  Location: [facility];  Service: Orthopedics;  Laterality: Right;  Please follow in room 6 if able   TUBAL LIGATION     VESICO-VAGINAL FISTULA REPAIR     Patient Active Problem List   Diagnosis Date Noted   Aortic atherosclerosis 02/07/2024   Chronic right shoulder pain 02/07/2024    Gastroesophageal reflux disease 11/16/2021   Asymptomatic varicose veins of bilateral lower extremities 08/18/2021   Dermatofibroma 08/18/2021   History of malignant neoplasm of skin 08/18/2021   Lentigo 08/18/2021   Melanocytic nevi of trunk 08/18/2021   Nevus lipomatosus cutaneous superficialis 08/18/2021   Rosacea 08/18/2021   Actinic keratosis 08/18/2021   Sensorineural hearing loss (SNHL) of left ear with unrestricted hearing of right ear 07/26/2021   Tinnitus of left ear 07/26/2021   Acute recurrent maxillary sinusitis 06/22/2021   Primary hypertension 01/12/2021   Hyperlipidemia associated with type 2 diabetes mellitus (HCC) 01/12/2021   Arthritis 01/12/2021   Type II diabetes mellitus (HCC) 11/25/2019   Ingrown toenail 09/05/2018   Neck pain 01/29/2018   Tick bite 10/24/2017    ONSET DATE: 02/07/24  REFERRING DIAG: M19.90 (ICD-10-CM) - Arthritis      THERAPY DIAG:  Muscle weakness (generalized) - Plan: Ot plan of care cert/re-cert  Stiffness of left hand, not elsewhere classified - Plan: Ot plan of care cert/re-cert  Stiffness of right hand, not elsewhere classified - Plan: Ot plan of care cert/re-cert  Pain in right hand - Plan: Ot plan of care cert/re-cert  Pain in left  hand - Plan: Ot plan of care cert/re-cert  Other lack of coordination - Plan: Ot plan of care cert/re-cert  Rationale for Evaluation and Treatment: Rehabilitation  SUBJECTIVE:   SUBJECTIVE STATEMENT: Pt reports waking up with pain in hands Pt accompanied by: self  PERTINENT HISTORY: 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 Pt was previously seen for OT and d/c 12/06/23  PRECAUTIONS: hx of R reverse toal shoulder, none for hands    WEIGHT BEARING RESTRICTIONS: No  PAIN:  Are you having pain? Yes: NPRS scale: R hand 7/10, L hand 6/10 Pain location: bilateral hands Pain description:  aching Aggravating factors: overuse, waking up in pain Relieving factors: paraffin Pt has R shoulder pain 6-8/10, PT is addressing, OT will not address., FALLS: Has patient fallen in last 6 months? No  LIVING ENVIRONMENT: Lives with: lives alone Lives in: House/apartment   PLOF: Independent  PATIENT GOALS: decrease pain in hands, improve function/ mobility  NEXT MD VISIT: unknown  OBJECTIVE:  Note: Objective measures were completed at Evaluation unless otherwise noted.  HAND DOMINANCE: Right  ADLs:Pt reports increased overall difficulty with ADLs due to pain and stiffness. Pt is mod I with basic ADLs. She has difficulty with buttons, styling hair, opening jars.    FUNCTIONAL OUTCOME MEASURES: Upper Extremity Functional Scale (UEFS): 33/80  UPPER EXTREMITY ROM:   RUE composite finger flexion 70% (3cm from palm at middle finger)     LUE composite finer flexion 60% (3.5cm from palm at middle finger) bilateral UE's grossly 90-95% composite finger extension     HAND FUNCTION: Grip strength: Right: 18 lbs; Left: 15 lbs, Lateral pinch: Right: 10 lbs, Left: 12 lbs, and Tip pinch: Right 4 lbs, Left: 2 lbs  COORDINATION:NT   SENSATION: WFL  EDEMA: Pt with bony defomities at PIP joints of all digits which pt reports are bone spurs, Pt also has bony deformity at DIP for right thumb, index and 5th digit and LUE small and index fingers   COGNITION: Overall cognitive status: Within functional limits for tasks assessed   OBSERVATIONS: Pleasant female well known to therpaist from previous occupational therapy at this site   TREATMENT DATE: 02/21/24- eval Paraffin to bilateral hands and wrists for pain management x 8 mins, no adverse reactions. P/ROM to digits in flexion  individually and compositely soft tissue mobs to bilateral hands. Pt reports decreased pain end of session.      PATIENT EDUCATION: Education details: potential goals Person educated: Patient Education  method: Explanation Education comprehension: verbalized understanding  HOME EXERCISE PROGRAM: n/a  GOALS: Goals reviewed with patient? Yes  SHORT TERM GOALS: Target date: 03/23/24  I with inital HEP.  Goal status: INITIAL  2.    Pt will improve RUE tip pinch by 2 lbs(from baseline) for increased ease with daily activities.                                baseline: RUE tip 4lbs, LUE tip 2 lbs                          Goal status: INITIAL  3.  Pt will improve LUE tip pinch by 2 lbs(from baseline) for increased ease with daily activities.  baseline: RUE tip 4lbs, LUE tip 2 lbs Goal status: INITIAL  4.  I with adapted strategies/  adapted equipment to minimize pain and to increase pt I with ADLs/IADLs   Goal status: INITIAL  5.  Pt will report bilateral hand pain no greater than 4/10 for ADLs/IADLS Baseline: RUE 7/10, LUE 6/10 Goal status: INITIAL      LONG TERM GOALS: Target date: 05/15/24      Pt will improve RUE grip strength to 22 # or greater for increased functional use Baseline: RUE 18# Goal status: INITIAL  2.  Pt will improve LUE grip strength to 25 # or greater for increased functional use Baseline: LUE 21# Goal status: INITIAL   3.Pt will demonstrate improved composite finger flexion for ADLs as evidenced by pt. bringing middle fingertip for RUE within 2.75 cm of palm.             Baseline: 3 cm from palm to middle fingertip             Goal status inital    4. .Pt will demonstrate improved composite finger flexion for ADLs as evidenced by pt. bringing middle fingertip for LUE within 3.25 of palm.             Baseline: 3.5 cm from palm to middle fingertip             Goal status: inital     5. check 9 hole peg test and set goals prn ASSESSMENT:  CLINICAL IMPRESSION: Patient is a 68 y.o. female who was seen today for occupational therapy evaluation for bilateral hand pain today due to arthritis and bone spurs. Pt s/p right reverse TSA which is being  addressed by PT. Pt is well known to therapist from previous occupational therapy. Pt returns to OT with a decline in function and increased pain. Pt presents with the following deficits: decreased strength, decreased ROM,decreased coordiantion, pain, and decreased UE functional use which impdes performance of ADLs/IADL. Pt can benefit from skilled OT to address these deficits in order to maximize pt's safety and I with daily activities.    PERFORMANCE DEFICITS: in functional skills including ADLs, IADLs, coordination, dexterity, proprioception, sensation, edema, ROM, strength, pain, flexibility, Fine motor control, Gross motor control, endurance, decreased knowledge of precautions, decreased knowledge of use of DME, and UE functional use,and psychosocial skills including coping strategies, environmental adaptation, habits, interpersonal interactions, and routines and behaviors.   IMPAIRMENTS: are limiting patient from ADLs, IADLs, rest and sleep, play, leisure, and social participation.   COMORBIDITIES: may have co-morbidities  that affects occupational performance. Patient will benefit from skilled OT to address above impairments and improve overall function.  MODIFICATION OR ASSISTANCE TO COMPLETE EVALUATION: No modification of tasks or assist necessary to complete an evaluation.  OT OCCUPATIONAL PROFILE AND HISTORY: Detailed assessment: Review of records and additional review of physical, cognitive, psychosocial history related to current functional performance.  CLINICAL DECISION MAKING: LOW - limited treatment options, no task modification necessary  REHAB POTENTIAL: Good  EVALUATION COMPLEXITY: Low      PLAN:  OT FREQUENCY: 1-2x/week  OT DURATION: 12 weeks  PLANNED INTERVENTIONS: 97168 OT Re-evaluation, 97535 self care/ADL training, 02889 therapeutic exercise, 97530 therapeutic activity, 97112 neuromuscular re-education, 97140 manual therapy, 97035 ultrasound, 97018 paraffin,  02989 moist heat, 97010 cryotherapy, 97014 electrical stimulation unattended, 97760 Orthotic Initial, 97763 Orthotic/Prosthetic subsequent, scar mobilization, passive range of motion, functional mobility training, energy conservation, coping strategies training, patient/family education, and DME and/or AE instructions  RECOMMENDED OTHER SERVICES: n/a  CONSULTED AND AGREED WITH PLAN OF CARE: Patient  PLAN FOR NEXT SESSION: paraffin,  inital HEP, check 9 hole peg test and set goals   Yifan Auker, OT 02/22/2024, 11:00 AM

## 2024-02-22 ENCOUNTER — Encounter: Payer: Self-pay | Admitting: Physical Therapy

## 2024-02-22 ENCOUNTER — Encounter: Payer: Self-pay | Admitting: Occupational Therapy

## 2024-02-22 ENCOUNTER — Ambulatory Visit: Admitting: Physical Therapy

## 2024-02-22 DIAGNOSIS — M25611 Stiffness of right shoulder, not elsewhere classified: Secondary | ICD-10-CM

## 2024-02-22 DIAGNOSIS — T84038A Mechanical loosening of other internal prosthetic joint, initial encounter: Secondary | ICD-10-CM | POA: Diagnosis not present

## 2024-02-22 DIAGNOSIS — M542 Cervicalgia: Secondary | ICD-10-CM

## 2024-02-22 DIAGNOSIS — T8459XA Infection and inflammatory reaction due to other internal joint prosthesis, initial encounter: Secondary | ICD-10-CM | POA: Diagnosis not present

## 2024-02-22 DIAGNOSIS — G8929 Other chronic pain: Secondary | ICD-10-CM

## 2024-02-22 DIAGNOSIS — M25642 Stiffness of left hand, not elsewhere classified: Secondary | ICD-10-CM | POA: Diagnosis not present

## 2024-02-22 DIAGNOSIS — M6281 Muscle weakness (generalized): Secondary | ICD-10-CM

## 2024-02-22 DIAGNOSIS — M25511 Pain in right shoulder: Secondary | ICD-10-CM | POA: Diagnosis not present

## 2024-02-22 DIAGNOSIS — R252 Cramp and spasm: Secondary | ICD-10-CM | POA: Diagnosis not present

## 2024-02-22 DIAGNOSIS — M79641 Pain in right hand: Secondary | ICD-10-CM | POA: Diagnosis not present

## 2024-02-22 DIAGNOSIS — R278 Other lack of coordination: Secondary | ICD-10-CM | POA: Diagnosis not present

## 2024-02-22 DIAGNOSIS — M79642 Pain in left hand: Secondary | ICD-10-CM | POA: Diagnosis not present

## 2024-02-22 DIAGNOSIS — M25641 Stiffness of right hand, not elsewhere classified: Secondary | ICD-10-CM | POA: Diagnosis not present

## 2024-02-22 DIAGNOSIS — Z96611 Presence of right artificial shoulder joint: Secondary | ICD-10-CM | POA: Diagnosis not present

## 2024-02-22 NOTE — Therapy (Signed)
 OUTPATIENT PHYSICAL THERAPY SHOULDER    Patient Name: Veronica Galvan MRN: [id number] DOB:27-Jun-1955, 68 y.o., female Today's Date: 02/22/2024  END OF SESSION:  PT End of Session - 02/22/24 0801     Visit Number 9    Date for Recertification  04/15/24    Authorization Type [employer] [place]    PT Start Time 0800    PT Stop Time 0901    PT Time Calculation (min) 61 min    Activity Tolerance Patient tolerated treatment well    Behavior During Therapy WFL for tasks assessed/performed           Past Medical History:  Diagnosis Date   Allergy     Anxiety    Arthritis    hands   Cataract    Diabetes mellitus without complication (HCC)    type 2   Family history of adverse reaction to anesthesia    son has malignant hyperthermia, daughter does not daughter recently had c section without problems   GERD (gastroesophageal reflux disease)    Headache    sinus   Hyperlipidemia    Hypertension    PONV (postoperative nausea and vomiting)    nausea only   Ulcer, stomach peptic yrs ago   Past Surgical History:  Procedure Laterality Date   APPENDECTOMY     both hells bone spur repair     both heels with metal clips   both shoulder rotator cuff repair     CESAREAN SECTION     x 1   COLONOSCOPY WITH PROPOFOL  N/A 09/07/2016   Procedure: COLONOSCOPY WITH PROPOFOL ;  Surgeon: Vicci Gladis POUR, MD;  Location: [facility];  Service: Endoscopy;  Laterality: N/A;   colonscopy  06/2011   polyps   ELBOW FRACTURE SURGERY Left    EYE SURGERY     FRACTURE SURGERY     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/10/2022   Procedure: REVERSE SHOULDER ARTHROPLASTY;  Surgeon: Melita Drivers, MD;  Location: [facility];  Service: Orthopedics;  Laterality: Right;  Please follow in room 6 if able   TUBAL LIGATION     VESICO-VAGINAL FISTULA REPAIR     Patient Active Problem List   Diagnosis Date Noted   Aortic atherosclerosis 02/07/2024   Chronic right shoulder pain 02/07/2024   Gastroesophageal  reflux disease 11/16/2021   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 08/18/2021   Dermatofibroma 08/18/2021   History of malignant neoplasm of skin 08/18/2021   Lentigo 08/18/2021   Melanocytic nevi of trunk 08/18/2021   Nevus lipomatosus cutaneous superficialis 08/18/2021   Rosacea 08/18/2021   Actinic keratosis 08/18/2021   Sensorineural hearing loss (SNHL) of left ear with unrestricted hearing of right ear 07/26/2021   Tinnitus of left ear 07/26/2021   Acute recurrent maxillary sinusitis 06/22/2021   Primary hypertension 01/12/2021   Hyperlipidemia associated with type 2 diabetes mellitus (HCC) 01/12/2021   Arthritis 01/12/2021   Type II diabetes mellitus (HCC) 11/25/2019   Ingrown toenail 09/05/2018   Neck pain 01/29/2018   Tick bite 10/24/2017    PCP: [name], [name]  REFERRING PROVIDER:[name], MD  REFERRING DIAG: s/p right reverse TSA with complications  THERAPY DIAG:  Muscle weakness (generalized)  Chronic right shoulder pain  Stiffness of right shoulder, not elsewhere classified  Cervicalgia  Spasm  Rationale for Evaluation and Treatment: Rehabilitation  ONSET DATE: 01/01/24  SUBJECTIVE:  SUBJECTIVE STATEMENT:  I feel a little stronger and may be a little better ROM but still hurt and can't believe how fatigued the shoulder mms get and how fast  Evaluation:  Patient had a fall on her deck, tripped on a board that was sticking up and had a severe fracture of the right proximal humerus, 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 We saw her in PT for many visits.  She had good progress but during a very busy time from Thanksgiving to [place] Years she started having pain and some regression of motion.  She has had x-rays, bone scan and a CT.  She saw Dr.  Cristy after the CT.  Feels like it could be an infection, PT vs a revision.   11/30/23 Patient reports that she saw the surgeon she is frustrated and upset as she feels that she still has no answers, it is felt that there is no loosening and no infection.  She is still hurting with basic ADL's, her AROM decreased beginning in March, as she started having increased pain in mid January after a very busy and active few month, she has an order for the left shoulder and the okay for us  to see her for her right shoulder for another month PAIN:  Are you having pain? Yes: NPRS scale: 7/10 Pain location: right shoulder, HA, left shoulder pain up to 8/10 with motions Pain description: dull ache at rest, sharp with motions Aggravating factors: quick motions, movements pain up to 10/10 Relieving factors: ice, rest, Tylenol  at best 3/10  PRECAUTIONS: none  RED FLAGS: None   WEIGHT BEARING RESTRICTIONS: No  FALLS:  Has patient fallen in last 6 months? no  LIVING ENVIRONMENT: Lives with: lives alone Lives in: House/apartment Stairs: No Has following equipment at home: None  OCCUPATION: retired  PLOF: Independent, very active with grandkids, does 5 classes a week at [employer]  PATIENT GOALS:dress without difficulty, do hair, have good ROM and less pain  NEXT MD VISIT: Mid November  OBJECTIVE:   DIAGNOSTIC FINDINGS:  See above  PATIENT SURVEYS:    COGNITION: Overall cognitive status: Within functional limits for tasks assessed     SENSATION: WFL  POSTURE: Fwd head, rounded shoulders, elevated and guarded shoulder  UPPER EXTREMITY ROM:   Active ROM Right AROM  eval Left AROM Eval Right AROM 02/22/24                   AROM 11/30/23  Shoulder flexion 105 125 112                   R 120  Shoulder extension                        Shoulder abduction 85 100 90                   Right 100  Shoulder adduction                        Shoulder internal rotation 0 30                     Right 28  Shoulder external rotation 46 65  j                    Right 60  Elbow flexion                        Elbow extension                        Wrist flexion                        Wrist extension                        Wrist ulnar deviation                        Wrist radial deviation                        Wrist pronation                        Wrist supination                        (Blank rows = not tested)  UPPER EXTREMITY MMT:  In the available ROM  MMT Right eval Left eval  Shoulder flexion 4- 4-  Shoulder extension    Shoulder abduction    Shoulder adduction    Shoulder internal rotation 4+ 4+  Shoulder external rotation 4-P! 4  Middle trapezius    Lower trapezius    Elbow flexion    Elbow extension    Wrist flexion    Wrist extension    Wrist ulnar deviation    Wrist radial deviation    Wrist pronation    Wrist supination    Grip strength (lbs)    (Blank rows = not tested)  PALPATION:  Very tight and tender in the pectoral, upper trap, the entire right upper arm, slight warmth in the right anterior lateral arm  Positive impingement on the right   TODAY'S TREATMENT:                                                                                                                                          DATE:  02/22/24 UBE level 4 x 6 minutes Rows 20# Lats 20# 5# chest press two positions Doorway stretch 3# wate bar OHP with assist from door frame 3# wate bar extension with PT overpressure Supine 5# isometric circles Supine 5# serratus punch Passive stretch right shoulder all motions to tolerance Vaso low pressure 34 degrees  02/20/24 UBE level 5 x 6 minutes Rows 20# Lats 20# Biceps 5#  Triceps 25# Doorway stretch Passive stretch right shoulder all motions Finger ladder Vaso low pressure  right shoulder  02/15/24 UBE x 6 minutes Rows 15#  LAts 15# Chest press 5# Triceps 20# 5# biceps 2# wall  slides, circles, overhead side to side Doorway stretches Passive stretches right UE Vaso low pressure right shoulder  02/13/24 15# rows 2x12 15# lats 2x12 5# chest press 2x12 3# wate bar extension 2# wall slides and circles 10# biceps 20# triceps 5# straight arm pulls Doorway stretch Passive right shoulder stretch all motions  02/08/24 Avoided left shoulder activity due to the injection yesterday PAssive stretch to the right shoulder all motions 3# small chest press 3# serratus push 3# isometric circles 2# supine flexion 1# right ER/IR STM to the upper traps, rhomboids and into the neck Vaso low pressure 34 degrees  02/06/24 UBE level 4 x 6 minutes Rows 10# 2x10 Lats 15# 2x10 Wall slides, circles Doorway stretch PAssive stretch to the right shoulder all motions and then the left shoulder all motions STM to the right upper trap and rhomboid and teres Vaso low pressure 34 degrees  02/02/24 UBE level 4 x 6 minutes 5# rows 15# lats Wall slides 1# circles 1# overhead side to side Wall ball isometric circles Passive stretch to the right shoulder all motions to discomfort STM to the right upper trap and neck Vaso 34 degrees medium pressure  PATIENT  EDUCATION: Education details: Dealer educated: Patient Education method: Programmer, multimedia, Facilities manager, Actor cues, Verbal cues, and Handouts Education comprehension: verbalized understanding  HOME EXERCISE PROGRAM:  ASSESSMENT:  CLINICAL IMPRESSION:  Patient reports that the shoulder gets very tired with activity, she has improved ROM with the right shoulder, still with c/o pain and fatigue.  She will see the surgeon today  Evaluation:  Patient is very frustrated, she had the right reverse TSA last July.  She did very well until around Christmas, then she really started having increased pain and lost some ROM.  She has had blood work done, US , bone scan and CT performed.  There has been some talk of a possible infection, MD feels like she may benefit from a revision.  She would like to try PT again and the MD agreed.  She has lost ROM since I last saw her, she also is having some significant left shoulder pain from over using it.   July 2025: Patient saw surgeon, the bone scan said did not feel that there was infection or loosening however the surgeon is not so sure, he wants to do an US  to be sure, that is scheduled for the next few weeks, since she has not been to PT in a month I re-measured her ROM, she has lost at least 15 degrees of motion and more for all motions.  She is also having more pain in the left shoulder possibly from overdoing it, because she is trying to not use the right shoulder.    OBJECTIVE IMPAIRMENTS: cardiopulmonary status limiting activity, decreased activity tolerance, decreased endurance, decreased ROM, decreased strength, increased edema, increased muscle spasms, impaired flexibility, impaired UE functional use, improper body mechanics, postural dysfunction, and pain.  REHAB POTENTIAL: Good  CLINICAL DECISION MAKING: Evolving/moderate complexity  EVALUATION COMPLEXITY: Low   GOALS: Goals reviewed with patient? Yes  SHORT TERM GOALS: Target date:  01/31/24  Independent with initial HEP Goal status: met 01/31/24  LONG TERM GOALS: Target date: 04/15/24  Decrease pain 50% Goal status: ongoing 02/22/24 2.  Dress without difficulty Goal status: ongoing 02/06/24 3.  Do hair without difficulty Goal status: ongoing 1023/25 4.  Increase AROM  right shoulder flexion to 130 degrees Goal status:  5.  Increase right shoulder ER to 60 degrees Goal status: ongoing 02/15/24  6.  Return to water  aerobics and or gym activity Goal status: on going 02/22/24  PLAN:  PT FREQUENCY: 1-2x/week  PT DURATION: 12 weeks  PLANNED INTERVENTIONS: Therapeutic exercises, Therapeutic activity, Neuromuscular re-education, Balance training, Gait training, Patient/Family education, Self Care, Joint mobilization, Dry Needling, Electrical stimulation, Cryotherapy, Vasopneumatic device, and Manual therapy  PLAN FOR NEXT SESSION: Try to regain some of her ROM and function, treat pain as needed, see what the MD says  02/22/2024, 8:01 AM [place] [facility] at [facility] [address] [facility]. [place], [place], [address] Phone: [phone]   Fax:  [phone]

## 2024-02-26 ENCOUNTER — Ambulatory Visit: Admitting: Occupational Therapy

## 2024-02-26 ENCOUNTER — Ambulatory Visit: Admitting: Physical Therapy

## 2024-02-26 ENCOUNTER — Encounter: Payer: Self-pay | Admitting: Physical Therapy

## 2024-02-26 DIAGNOSIS — M25642 Stiffness of left hand, not elsewhere classified: Secondary | ICD-10-CM | POA: Diagnosis not present

## 2024-02-26 DIAGNOSIS — M25641 Stiffness of right hand, not elsewhere classified: Secondary | ICD-10-CM | POA: Diagnosis not present

## 2024-02-26 DIAGNOSIS — M25611 Stiffness of right shoulder, not elsewhere classified: Secondary | ICD-10-CM | POA: Diagnosis not present

## 2024-02-26 DIAGNOSIS — G8929 Other chronic pain: Secondary | ICD-10-CM | POA: Diagnosis not present

## 2024-02-26 DIAGNOSIS — M25511 Pain in right shoulder: Secondary | ICD-10-CM | POA: Diagnosis not present

## 2024-02-26 DIAGNOSIS — R252 Cramp and spasm: Secondary | ICD-10-CM | POA: Diagnosis not present

## 2024-02-26 DIAGNOSIS — R278 Other lack of coordination: Secondary | ICD-10-CM

## 2024-02-26 DIAGNOSIS — M6281 Muscle weakness (generalized): Secondary | ICD-10-CM

## 2024-02-26 DIAGNOSIS — M542 Cervicalgia: Secondary | ICD-10-CM

## 2024-02-26 DIAGNOSIS — M79641 Pain in right hand: Secondary | ICD-10-CM

## 2024-02-26 DIAGNOSIS — M79642 Pain in left hand: Secondary | ICD-10-CM

## 2024-02-26 NOTE — Therapy (Signed)
 OUTPATIENT PHYSICAL THERAPY SHOULDER  Progress Note Reporting Period 01/15/24 to 02/26/24  See note below for Objective Data and Assessment of Progress/Goals.      Patient Name: Veronica Galvan MRN: [id number] DOB:Jul 25, 1955, 68 y.o., female Today's Date: 02/26/2024  END OF SESSION:  PT End of Session - 02/26/24 1354     Visit Number 10    Date for Recertification  04/15/24    Authorization Type [employer] [place]    PT Start Time 1354    PT Stop Time 1457    PT Time Calculation (min) 63 min    Activity Tolerance Patient tolerated treatment well    Behavior During Therapy WFL for tasks assessed/performed           Past Medical History:  Diagnosis Date   Allergy     Anxiety    Arthritis    hands   Cataract    Diabetes mellitus without complication (HCC)    type 2   Family history of adverse reaction to anesthesia    son has malignant hyperthermia, daughter does not daughter recently had c section without problems   GERD (gastroesophageal reflux disease)    Headache    sinus   Hyperlipidemia    Hypertension    PONV (postoperative nausea and vomiting)    nausea only   Ulcer, stomach peptic yrs ago   Past Surgical History:  Procedure Laterality Date   APPENDECTOMY     both hells bone spur repair     both heels with metal clips   both shoulder rotator cuff repair     CESAREAN SECTION     x 1   COLONOSCOPY WITH PROPOFOL  N/A 09/07/2016   Procedure: COLONOSCOPY WITH PROPOFOL ;  Surgeon: Vicci Gladis POUR, MD;  Location: [facility];  Service: Endoscopy;  Laterality: N/A;   colonscopy  06/2011   polyps   ELBOW FRACTURE SURGERY Left    EYE SURGERY     FRACTURE SURGERY     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/10/2022   Procedure: REVERSE SHOULDER ARTHROPLASTY;  Surgeon: Melita Drivers, MD;  Location: [facility];  Service: Orthopedics;  Laterality: Right;  Please follow in room 6 if able   TUBAL LIGATION     VESICO-VAGINAL FISTULA REPAIR     Patient Active Problem  List   Diagnosis Date Noted   Aortic atherosclerosis 02/07/2024   Chronic right shoulder pain 02/07/2024   Gastroesophageal reflux disease 11/16/2021   Asymptomatic varicose veins of bilateral lower extremities 08/18/2021   Dermatofibroma 08/18/2021   History of malignant neoplasm of skin 08/18/2021   Lentigo 08/18/2021   Melanocytic nevi of trunk 08/18/2021   Nevus lipomatosus cutaneous superficialis 08/18/2021   Rosacea 08/18/2021   Actinic keratosis 08/18/2021   Sensorineural hearing loss (SNHL) of left ear with unrestricted hearing of right ear 07/26/2021   Tinnitus of left ear 07/26/2021   Acute recurrent maxillary sinusitis 06/22/2021   Primary hypertension 01/12/2021   Hyperlipidemia associated with type 2 diabetes mellitus (HCC) 01/12/2021   Arthritis 01/12/2021   Type II diabetes mellitus (HCC) 11/25/2019   Ingrown toenail 09/05/2018   Neck pain 01/29/2018   Tick bite 10/24/2017    PCP: [name], [name]  REFERRING PROVIDER:[name], MD  REFERRING DIAG: s/p right reverse TSA with complications  THERAPY DIAG:  Muscle weakness (generalized)  Chronic right shoulder pain  Stiffness of right shoulder, not elsewhere classified  Cervicalgia  Spasm  Rationale for Evaluation and Treatment: Rehabilitation  ONSET DATE: 01/01/24  SUBJECTIVE:  SUBJECTIVE STATEMENT:  I saw surgeon last week.  She is scheduled for surgery 05/29/24 for the revision, he wants her to continue with PT for the ROM, strength and function.    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 We saw her in PT for many visits.  She had good progress but during a very busy time  from Thanksgiving to [place] Years she started having pain and some regression of motion.  She has had x-rays, bone scan and a CT.  She saw [name] after the CT.  Feels like it could be an infection, PT vs a revision.   11/30/23 Patient reports that she saw the surgeon she is frustrated and upset as she feels that she still has no answers, it is felt that there is no loosening and no infection.  She is still hurting with basic ADL's, her AROM decreased beginning in March, as she started having increased pain in mid January after a very busy and active few month, she has an order for the left shoulder and the okay for us  to see her for her right shoulder for another month PAIN:  Are you having pain? Yes: NPRS scale: 7/10 Pain location: right shoulder, HA, left shoulder pain up to 8/10 with motions Pain description: dull ache at rest, sharp with motions Aggravating factors: quick motions, movements pain up to 10/10 Relieving factors: ice, rest, Tylenol  at best 3/10  PRECAUTIONS: none  RED FLAGS: None   WEIGHT BEARING RESTRICTIONS: No  FALLS:  Has patient fallen in last 6 months? no  LIVING ENVIRONMENT: Lives with: lives alone Lives in: House/apartment Stairs: No Has following equipment at home: None  OCCUPATION: retired  PLOF: Independent, very active with grandkids, does 5 classes a week at [employer]  PATIENT GOALS:dress without difficulty, do hair, have good ROM and less pain  NEXT MD VISIT: Mid November  OBJECTIVE:   DIAGNOSTIC FINDINGS:  See above  PATIENT SURVEYS:    COGNITION: Overall cognitive status: Within functional limits for tasks assessed     SENSATION: WFL  POSTURE: Fwd head, rounded shoulders, elevated and guarded shoulder  UPPER EXTREMITY ROM:   Active ROM Right AROM  eval Left AROM Eval Right AROM 02/22/24                   AROM 11/30/23  Shoulder flexion 105 125 112                   R 120  Shoulder extension                        Shoulder  abduction 85 100 90                   Right 100  Shoulder adduction                        Shoulder internal rotation 0 30                    Right 28  Shoulder external rotation 46 65  j                    Right 60  Elbow flexion                        Elbow extension                        Wrist flexion                        Wrist extension                        Wrist ulnar deviation                        Wrist radial deviation                        Wrist pronation                        Wrist supination                        (Blank rows = not tested)  UPPER EXTREMITY MMT:  In the available ROM  MMT Right eval Left eval  Shoulder flexion 4- 4-  Shoulder extension    Shoulder abduction    Shoulder adduction    Shoulder internal rotation 4+ 4+  Shoulder external rotation 4-P! 4  Middle trapezius    Lower trapezius    Elbow flexion    Elbow extension    Wrist flexion    Wrist extension    Wrist ulnar deviation    Wrist radial deviation    Wrist pronation    Wrist supination    Grip strength (lbs)    (Blank rows = not tested)  PALPATION:  Very tight and tender in the pectoral, upper trap, the entire right upper arm, slight  warmth in the right anterior lateral arm  Positive impingement on the right   TODAY'S TREATMENT:                                                                                                                                         DATE:  02/26/24 UBE level 4 x 6 minutes doorway stretch Passive right shoulder stretches STM to the upper traps, neck and rhomboids Passive LE stretches Vaso right shoulder low pressure 34 degrees  02/22/24 UBE level 4 x 6 minutes Rows 20# Lats 20# 5# chest press two positions Doorway stretch 3# wate bar OHP with assist from door frame 3# wate bar extension with PT overpressure Supine 5# isometric circles Supine 5# serratus punch Passive stretch right shoulder all motions to tolerance Vaso low pressure  34 degrees  02/20/24 UBE level 5 x 6 minutes Rows 20# Lats 20# Biceps 5#  Triceps 25# Doorway stretch Passive stretch right shoulder all motions Finger ladder Vaso low pressure right shoulder  02/15/24 UBE x 6 minutes Rows 15#  LAts 15# Chest press 5# Triceps 20# 5# biceps 2# wall  slides, circles, overhead side to side Doorway stretches Passive stretches right UE Vaso low pressure right shoulder  02/13/24 15# rows 2x12 15# lats 2x12 5# chest press 2x12 3# wate bar extension 2# wall slides and circles 10# biceps 20# triceps 5# straight arm pulls Doorway stretch Passive right shoulder stretch all motions  02/08/24 Avoided left shoulder activity due to the injection yesterday PAssive stretch to the right shoulder all motions 3# small chest press 3# serratus push 3# isometric circles 2# supine flexion 1# right ER/IR STM to the upper traps, rhomboids and into the neck Vaso low pressure 34 degrees  02/06/24 UBE level 4 x 6 minutes Rows 10# 2x10 Lats 15# 2x10 Wall slides, circles Doorway stretch PAssive stretch to the right shoulder all motions and then the left shoulder all motions STM to the right upper trap and  rhomboid and teres Vaso low pressure 34 degrees  02/02/24 UBE level 4 x 6 minutes 5# rows 15# lats Wall slides 1# circles 1# overhead side to side Wall ball isometric circles Passive stretch to the right shoulder all motions to discomfort STM to the right upper trap and neck Vaso 34 degrees medium pressure  PATIENT EDUCATION: Education details: poc Person educated: Patient Education method: Programmer, Multimedia, Facilities Manager, Actor cues, Verbal cues, and Handouts Education comprehension: verbalized understanding  HOME EXERCISE PROGRAM:  ASSESSMENT:  CLINICAL IMPRESSION:  Patient reports that she saw the shoulder surgeon last week, tentatively the surgery is scheduled for May 29, 2024, the MD notes says to continue PT up until that time to assure ROM and strength, the other issue noted is the left shoulder pain and loss of ROM, she has had tom compensate over the past year due to the right shoulder issues and the left shoulder is now hurting, wants us  to address the left shoulder as well  Evaluation:  Patient is very frustrated, she had the right reverse TSA last July.  She did very well until around Christmas, then she really started having increased pain and lost some ROM.  She has had blood work done, US , bone scan and CT performed.  There has been some talk of a possible infection, MD feels like she may benefit from a revision.  She would like to try PT again and the MD agreed.  She has lost ROM since I last saw her, she also is having some significant left shoulder pain from over using it.   July 2025: Patient saw surgeon, the bone scan said did not feel that there was infection or loosening however the surgeon is not so sure, he wants to do an US  to be sure, that is scheduled for the next few weeks, since she has not been to PT in a month I re-measured her ROM, she has lost at least 15 degrees of motion and more for all motions.  She is also having more pain in the left shoulder  possibly from overdoing it, because she is trying to not use the right shoulder.    OBJECTIVE IMPAIRMENTS: cardiopulmonary status limiting activity, decreased activity tolerance, decreased endurance, decreased ROM, decreased strength, increased edema, increased muscle spasms, impaired flexibility, impaired UE functional use, improper body mechanics,  postural dysfunction, and pain.  REHAB POTENTIAL: Good  CLINICAL DECISION MAKING: Evolving/moderate complexity  EVALUATION COMPLEXITY: Low   GOALS: Goals reviewed with patient? Yes  SHORT TERM GOALS: Target date: 01/31/24  Independent with initial HEP Goal status: met 01/31/24  LONG TERM GOALS: Target date: 04/15/24  Decrease pain 50% Goal status: ongoing 02/26/24 2.  Dress without difficulty Goal status: ongoing 02/26/24 3.  Do hair without difficulty Goal status: ongoing 02/26/24 4.  Increase AROM right shoulder flexion to 130 degrees Goal status:  5.  Increase right shoulder ER to 60 degrees Goal status: ongoing 02/26/24  6.  Return to water  aerobics and or gym activity Goal status: on going 02/26/24  PLAN:  PT FREQUENCY: 1-2x/week  PT DURATION: 12 weeks  PLANNED INTERVENTIONS: Therapeutic exercises, Therapeutic activity, Neuromuscular re-education, Balance training, Gait training, Patient/Family education, Self Care, Joint mobilization, Dry Needling, Electrical stimulation, Cryotherapy, Vasopneumatic device, and Manual therapy  PLAN FOR NEXT SESSION: Try to regain some of her ROM and function, treat pain as needed, address the left shoulder as well  02/26/2024, 1:54 PM [facility] at [facility] [facility]. [place], [place], [address] Phone: [phone]   Fax:  [phone]

## 2024-02-26 NOTE — Therapy (Signed)
 OUTPATIENT OCCUPATIONAL THERAPY ORTHO  treatment  Patient Name: Veronica Galvan MRN: [id number] DOB:09-05-1955, 68 y.o., female Today's Date: 02/26/2024  PCP: Almarie Birmingham, NP REFERRING PROVIDER: Almarie Birmingham, NP  END OF SESSION:  OT End of Session - 02/26/24 1648     Visit Number 2    Number of Visits 12    Date for Recertification  05/15/24    Authorization Type BC[employer] M[employer]    Authorization Time Period 12 weeks    Authorization - Visit Number 2    Progress Note Due on Visit 10    OT Start Time 1445    OT Stop Time 1525    OT Time Calculation (min) 40 min           Past Medical History:  Diagnosis Date   Allergy     Anxiety    Arthritis    hands   Cataract    Diabetes mellitus without complication (HCC)    type 2   Family history of adverse reaction to anesthesia    son has malignant hyperthermia, daughter does not daughter recently had c section without problems   GERD (gastroesophageal reflux disease)    Headache    sinus   Hyperlipidemia    Hypertension    PONV (postoperative nausea and vomiting)    nausea only   Ulcer, stomach peptic yrs ago   Past Surgical History:  Procedure Laterality Date   APPENDECTOMY     both hells bone spur repair     both heels with metal clips   both shoulder rotator cuff repair     CESAREAN SECTION     x 1   COLONOSCOPY WITH PROPOFOL  N/A 09/07/2016   Procedure: COLONOSCOPY WITH PROPOFOL ;  Surgeon: Vicci Gladis POUR, MD;  Location: [facility];  Service: Endoscopy;  Laterality: N/A;   colonscopy  06/2011   polyps   ELBOW FRACTURE SURGERY Left    EYE SURGERY     FRACTURE SURGERY     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/10/2022   Procedure: REVERSE SHOULDER ARTHROPLASTY;  Surgeon: Melita Drivers, MD;  Location: [facility];  Service: Orthopedics;  Laterality: Right;  Please follow in room 6 if able   TUBAL LIGATION     VESICO-VAGINAL FISTULA REPAIR     Patient Active Problem List   Diagnosis Date Noted   Aortic  atherosclerosis 02/07/2024   Chronic right shoulder pain 02/07/2024   Gastroesophageal reflux disease 11/16/2021   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 08/18/2021   Dermatofibroma 08/18/2021   History of malignant neoplasm of skin 08/18/2021   Lentigo 08/18/2021   Melanocytic nevi of trunk 08/18/2021   Nevus lipomatosus cutaneous superficialis 08/18/2021   Rosacea 08/18/2021   Actinic keratosis 08/18/2021   Sensorineural hearing loss (SNHL) of left ear with unrestricted hearing of right ear 07/26/2021   Tinnitus of left ear 07/26/2021   Acute recurrent maxillary sinusitis 06/22/2021   Primary hypertension 01/12/2021   Hyperlipidemia associated with type 2 diabetes mellitus (HCC) 01/12/2021   Arthritis 01/12/2021   Type II diabetes mellitus (HCC) 11/25/2019   Ingrown toenail 09/05/2018   Neck pain 01/29/2018   Tick bite 10/24/2017    ONSET DATE: 02/07/24  REFERRING DIAG: M19.90 (ICD-10-CM) - Arthritis      THERAPY DIAG:  Muscle weakness (generalized)  Stiffness of right hand, not elsewhere classified  Pain in right hand  Pain in left hand  Other lack of coordination  Rationale for Evaluation and Treatment: Rehabilitation  SUBJECTIVE:  SUBJECTIVE STATEMENT: Pt reports her hands are feeling better Pt accompanied by: self  PERTINENT HISTORY: 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 Pt was previously seen for OT and d/c 12/06/23  PRECAUTIONS: hx of R reverse toal shoulder, none for hands    WEIGHT BEARING RESTRICTIONS: No  PAIN:  Are you having pain? Yes: NPRS scale: R hand 6/10, L hand 5/10 Pain location: bilateral hands Pain description: aching Aggravating factors: overuse, waking up in pain Relieving factors: paraffin Pt has R shoulder pain 6-8/10, PT is addressing, OT will not address., FALLS: Has patient fallen in last 6 months? No  LIVING  ENVIRONMENT: Lives with: lives alone Lives in: House/apartment   PLOF: Independent  PATIENT GOALS: decrease pain in hands, improve function/ mobility  NEXT MD VISIT: unknown  OBJECTIVE:  Note: Objective measures were completed at Evaluation unless otherwise noted.  HAND DOMINANCE: Right  ADLs:Pt reports increased overall difficulty with ADLs due to pain and stiffness. Pt is mod I with basic ADLs. She has difficulty with buttons, styling hair, opening jars.    FUNCTIONAL OUTCOME MEASURES: Upper Extremity Functional Scale (UEFS): 33/80  UPPER EXTREMITY ROM:   RUE composite finger flexion 70% (3cm from palm at middle finger)     LUE composite finer flexion 60% (3.5cm from palm at middle finger) bilateral UE's grossly 90-95% composite finger extension     HAND FUNCTION: Grip strength: Right: 18 lbs; Left: 15 lbs, Lateral pinch: Right: 10 lbs, Left: 12 lbs, and Tip pinch: Right 4 lbs, Left: 2 lbs  COORDINATION:NT   SENSATION: WFL  EDEMA: Pt with bony defomities at PIP joints of all digits which pt reports are bone spurs, Pt also has bony deformity at DIP for right thumb, index and 5th digit and LUE small and index fingers   COGNITION: Overall cognitive status: Within functional limits for tasks assessed   OBSERVATIONS: Pleasant female well known to therpaist from previous occupational therapy at this site   TREATMENT DATE: Paraffin to right hand for pain management x 8 mins, no adverse reactions while therapist perfromed US  to left hand and digits 3.3 mhz, 0.8 w/cm 2, 20% x 8 mins no adverse reactions. Paraffin to left hand for pain management x 8 mins, no adverse reactions while therapist performed US  to right hand and digits 3.3 mhz, 0.8 w/cm 2, 20% x 8 mins no adverse reactions.P/ROM to digits in flexion  individually and compositely.  Soft tissue and joint mobs to bilateral hands, wrist and forearms.  Discussed improtance of balancing activity and rest in order to  management pain and prevent injury.  02/21/24- eval Paraffin to bilateral hands and wrists for pain management x 8 mins, no adverse reactions. P/ROM to digits in flexion  individually and compositely soft tissue mobs to bilateral hands. Pt reports decreased pain end of session.      PATIENT EDUCATION: Education details: see above Person educated: Patient Education method: Explanation Education comprehension: verbalized understanding  HOME EXERCISE PROGRAM: n/a  GOALS: Goals reviewed with patient? Yes  SHORT TERM GOALS: Target date: 03/23/24  I with inital HEP.  Goal status:  ongoing 02/26/24  2.    Pt will improve RUE tip pinch by 2 lbs(from baseline) for increased ease with daily activities.  baseline: RUE tip 4lbs, LUE tip 2 lbs                          Goal status: INITIAL  3.  Pt will improve LUE tip pinch by 2 lbs(from baseline) for increased ease with daily activities.  baseline: RUE tip 4lbs, LUE tip 2 lbs Goal status: INITIAL  4.  I with adapted strategies/ adapted equipment to minimize pain and to increase pt I with ADLs/IADLs   Goal status: INITIAL  5.  Pt will report bilateral hand pain no greater than 4/10 for ADLs/IADLS Baseline: RUE 7/10, LUE 6/10 Goal status: INITIAL      LONG TERM GOALS: Target date: 05/15/24      Pt will improve RUE grip strength to 22 # or greater for increased functional use Baseline: RUE 18# Goal status: INITIAL  2.  Pt will improve LUE grip strength to 25 # or greater for increased functional use Baseline: LUE 21# Goal status: INITIAL   3.Pt will demonstrate improved composite finger flexion for ADLs as evidenced by pt. bringing middle fingertip for RUE within 2.75 cm of palm.             Baseline: 3 cm from palm to middle fingertip             Goal status inital    4. .Pt will demonstrate improved composite finger flexion for ADLs as evidenced by pt. bringing middle fingertip for LUE  within 3.25 of palm.             Baseline: 3.5 cm from palm to middle fingertip             Goal status: inital     5. check 9 hole peg test and set goals prn ASSESSMENT:  CLINICAL IMPRESSION: Patient is progressing towards goals. Her bilateral hand pain and stiffness is improved today.   PERFORMANCE DEFICITS: in functional skills including ADLs, IADLs, coordination, dexterity, proprioception, sensation, edema, ROM, strength, pain, flexibility, Fine motor control, Gross motor control, endurance, decreased knowledge of precautions, decreased knowledge of use of DME, and UE functional use,and psychosocial skills including coping strategies, environmental adaptation, habits, interpersonal interactions, and routines and behaviors.   IMPAIRMENTS: are limiting patient from ADLs, IADLs, rest and sleep, play, leisure, and social participation.   COMORBIDITIES: may have co-morbidities  that affects occupational performance. Patient will benefit from skilled OT to address above impairments and improve overall function.  MODIFICATION OR ASSISTANCE TO COMPLETE EVALUATION: No modification of tasks or assist necessary to complete an evaluation.  OT OCCUPATIONAL PROFILE AND HISTORY: Detailed assessment: Review of records and additional review of physical, cognitive, psychosocial history related to current functional performance.  CLINICAL DECISION MAKING: LOW - limited treatment options, no task modification necessary  REHAB POTENTIAL: Good  EVALUATION COMPLEXITY: Low      PLAN:  OT FREQUENCY: 1-2x/week  OT DURATION: 12 weeks  PLANNED INTERVENTIONS: 97168 OT Re-evaluation, 97535 self care/ADL training, 02889 therapeutic exercise, 97530 therapeutic activity, 97112 neuromuscular re-education, 97140 manual therapy, 97035 ultrasound, 97018 paraffin, 02989 moist heat, 97010 cryotherapy, 97014 electrical stimulation unattended, 97760 Orthotic Initial, 97763 Orthotic/Prosthetic subsequent, scar  mobilization, passive range of motion, functional mobility training, energy conservation, coping strategies training, patient/family education, and DME and/or AE instructions  RECOMMENDED OTHER SERVICES: n/a  CONSULTED AND AGREED WITH PLAN OF CARE: Patient  PLAN FOR NEXT SESSION: US , inital HEP, check 9 hole peg test and set goals prn   Karson Reede,  OT 02/26/2024, 4:49 PM

## 2024-02-27 ENCOUNTER — Encounter: Payer: Self-pay | Admitting: Physical Therapy

## 2024-02-27 ENCOUNTER — Ambulatory Visit: Admitting: Physical Therapy

## 2024-02-27 DIAGNOSIS — M25641 Stiffness of right hand, not elsewhere classified: Secondary | ICD-10-CM | POA: Diagnosis not present

## 2024-02-27 DIAGNOSIS — M79641 Pain in right hand: Secondary | ICD-10-CM | POA: Diagnosis not present

## 2024-02-27 DIAGNOSIS — G8929 Other chronic pain: Secondary | ICD-10-CM

## 2024-02-27 DIAGNOSIS — M6281 Muscle weakness (generalized): Secondary | ICD-10-CM

## 2024-02-27 DIAGNOSIS — M25611 Stiffness of right shoulder, not elsewhere classified: Secondary | ICD-10-CM | POA: Diagnosis not present

## 2024-02-27 DIAGNOSIS — R252 Cramp and spasm: Secondary | ICD-10-CM

## 2024-02-27 DIAGNOSIS — M79642 Pain in left hand: Secondary | ICD-10-CM | POA: Diagnosis not present

## 2024-02-27 DIAGNOSIS — M542 Cervicalgia: Secondary | ICD-10-CM | POA: Diagnosis not present

## 2024-02-27 DIAGNOSIS — M25511 Pain in right shoulder: Secondary | ICD-10-CM | POA: Diagnosis not present

## 2024-02-27 DIAGNOSIS — R278 Other lack of coordination: Secondary | ICD-10-CM | POA: Diagnosis not present

## 2024-02-27 DIAGNOSIS — M25642 Stiffness of left hand, not elsewhere classified: Secondary | ICD-10-CM | POA: Diagnosis not present

## 2024-02-27 NOTE — Therapy (Signed)
 OUTPATIENT PHYSICAL THERAPY SHOULDER    Patient Name: Veronica Galvan MRN: [id number] DOB:1955/10/18, 68 y.o., female Today's Date: 02/27/2024  END OF SESSION:  PT End of Session - 02/27/24 0758     Visit Number 11    Date for Recertification  04/15/24    Authorization Type [employer]BS [place]    PT Start Time [phone]    PT Stop Time 0900    PT Time Calculation (min) 62 min    Activity Tolerance Patient tolerated treatment well    Behavior During Therapy WFL for tasks assessed/performed           Past Medical History:  Diagnosis Date   Allergy     Anxiety    Arthritis    hands   Cataract    Diabetes mellitus without complication (HCC)    type 2   Family history of adverse reaction to anesthesia    son has malignant hyperthermia, daughter does not daughter recently had c section without problems   GERD (gastroesophageal reflux disease)    Headache    sinus   Hyperlipidemia    Hypertension    PONV (postoperative nausea and vomiting)    nausea only   Ulcer, stomach peptic yrs ago   Past Surgical History:  Procedure Laterality Date   APPENDECTOMY     both hells bone spur repair     both heels with metal clips   both shoulder rotator cuff repair     CESAREAN SECTION     x 1   COLONOSCOPY WITH PROPOFOL  N/A 09/07/2016   Procedure: COLONOSCOPY WITH PROPOFOL ;  Surgeon: Vicci Gladis POUR, MD;  Location: [facility];  Service: Endoscopy;  Laterality: N/A;   colonscopy  06/2011   polyps   ELBOW FRACTURE SURGERY Left    EYE SURGERY     FRACTURE SURGERY     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/10/2022   Procedure: REVERSE SHOULDER ARTHROPLASTY;  Surgeon: Melita Drivers, MD;  Location: [facility];  Service: Orthopedics;  Laterality: Right;  Please follow in room 6 if able   TUBAL LIGATION     VESICO-VAGINAL FISTULA REPAIR     Patient Active Problem List   Diagnosis Date Noted   Aortic atherosclerosis 02/07/2024   Chronic right shoulder pain 02/07/2024   Gastroesophageal  reflux disease 11/16/2021   Asymptomatic varicose veins of bilateral lower extremities 08/18/2021   Dermatofibroma 08/18/2021   History of malignant neoplasm of skin 08/18/2021   Lentigo 08/18/2021   Melanocytic nevi of trunk 08/18/2021   Nevus lipomatosus cutaneous superficialis 08/18/2021   Rosacea 08/18/2021   Actinic keratosis 08/18/2021   Sensorineural hearing loss (SNHL) of left ear with unrestricted hearing of right ear 07/26/2021   Tinnitus of left ear 07/26/2021   Acute recurrent maxillary sinusitis 06/22/2021   Primary hypertension 01/12/2021   Hyperlipidemia associated with type 2 diabetes mellitus (HCC) 01/12/2021   Arthritis 01/12/2021   Type II diabetes mellitus (HCC) 11/25/2019   Ingrown toenail 09/05/2018   Neck pain 01/29/2018   Tick bite 10/24/2017    PCP: [name], [name]  REFERRING PROVIDER:[name], MD  REFERRING DIAG: s/p right reverse TSA with complications  THERAPY DIAG:  Muscle weakness (generalized)  Stiffness of right hand, not elsewhere classified  Chronic right shoulder pain  Cervicalgia  Spasm  Rationale for Evaluation and Treatment: Rehabilitation  ONSET DATE: 01/01/24  SUBJECTIVE:  SUBJECTIVE STATEMENT:  I am stiff and sore  Evaluation:  Patient had a fall on her deck, tripped on a board that was sticking up and had a severe fracture of the right proximal humerus, 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 We saw her in PT for many visits.  She had good progress but during a very busy time from Thanksgiving to [place] Years she started having pain and some regression of motion.  She has had x-rays, bone scan and a CT.  She saw [name] after the CT.  Feels like it could be an infection, PT vs a revision.   11/30/23 Patient reports that she saw the  surgeon she is frustrated and upset as she feels that she still has no answers, it is felt that there is no loosening and no infection.  She is still hurting with basic ADL's, her AROM decreased beginning in March, as she started having increased pain in mid January after a very busy and active few month, she has an order for the left shoulder and the okay for us  to see her for her right shoulder for another month PAIN:  Are you having pain? Yes: NPRS scale: 7/10 Pain location: right shoulder, HA, left shoulder pain up to 8/10 with motions Pain description: dull ache at rest, sharp with motions Aggravating factors: quick motions, movements pain up to 10/10 Relieving factors: ice, rest, Tylenol  at best 3/10  PRECAUTIONS: none  RED FLAGS: None   WEIGHT BEARING RESTRICTIONS: No  FALLS:  Has patient fallen in last 6 months? no  LIVING ENVIRONMENT: Lives with: lives alone Lives in: House/apartment Stairs: No Has following equipment at home: None  OCCUPATION: retired  PLOF: Independent, very active with grandkids, does 5 classes a week [employer]  PATIENT GOALS:dress without difficulty, do hair, have good ROM and less pain  NEXT MD VISIT: Mid November  OBJECTIVE:   DIAGNOSTIC FINDINGS:  See above  PATIENT SURVEYS:    COGNITION: Overall cognitive status: Within functional limits for tasks assessed     SENSATION: WFL  POSTURE: Fwd head, rounded shoulders, elevated and guarded shoulder  UPPER EXTREMITY ROM:   Active ROM Right AROM  eval Left AROM Eval Right AROM 02/22/24                   AROM 11/30/23  Shoulder flexion 105 125 112                   R 120  Shoulder extension                        Shoulder abduction 85 100 90                   Right 100  Shoulder adduction                        Shoulder internal rotation 0 30                    Right 28  Shoulder external rotation 46 65  j                    Right 60  Elbow flexion                        Elbow extension                        Wrist flexion                        Wrist extension                        Wrist ulnar deviation                        Wrist radial deviation                        Wrist pronation                        Wrist supination                        (Blank rows = not tested)  UPPER EXTREMITY MMT:  In the available ROM  MMT Right eval Left eval  Shoulder flexion 4- 4-  Shoulder extension    Shoulder abduction    Shoulder adduction    Shoulder internal rotation 4+ 4+  Shoulder external rotation 4-P! 4  Middle trapezius    Lower trapezius    Elbow flexion    Elbow extension    Wrist flexion    Wrist extension    Wrist ulnar deviation    Wrist radial deviation    Wrist pronation    Wrist supination    Grip strength (lbs)    (Blank rows = not tested)  PALPATION:  Very tight and tender in the pectoral, upper trap, the entire right upper arm, slight warmth in the right anterior lateral arm  Positive impingement on the right   TODAY'S TREATMENT:                                                                                                                                         DATE:  02/27/24 Nustep  level 5 x 6 minutes 20# rows 20# lats 5# chest press 10# biceps 25# triceps 3# extension with wate bar behind the back  3# wate bar OHP to finger ladder LE stretches Passive ROM right shoulder all motions to tolerance   02/26/24 UBE level 4 x 6 minutes doorway stretch Passive right shoulder stretches STM to the upper traps, neck and rhomboids Passive LE stretches Vaso right shoulder low pressure 34 degrees  02/22/24 UBE level 4 x 6 minutes  Rows 20# Lats 20# 5# chest press two positions Doorway stretch 3# wate bar OHP with assist from door frame 3# wate bar extension with PT overpressure Supine 5# isometric circles Supine 5# serratus punch Passive stretch right shoulder all motions to tolerance Vaso low pressure 34 degrees  02/20/24 UBE level 5 x 6 minutes Rows 20# Lats 20# Biceps 5#  Triceps 25# Doorway stretch Passive stretch right shoulder all motions Finger ladder Vaso low pressure right shoulder  02/15/24 UBE x 6 minutes Rows 15#  LAts 15# Chest press 5# Triceps 20# 5# biceps 2# wall  slides, circles, overhead side to side Doorway stretches Passive stretches right UE Vaso low pressure right shoulder  02/13/24 15# rows 2x12 15# lats 2x12 5# chest press 2x12 3# wate bar extension 2# wall slides and circles 10# biceps 20# triceps 5# straight arm pulls Doorway stretch Passive right shoulder stretch all motions  02/08/24 Avoided left shoulder activity due to the injection yesterday PAssive stretch to the right shoulder all motions 3# small chest press 3# serratus push 3# isometric circles 2# supine flexion 1# right ER/IR STM to the upper traps, rhomboids and into the neck Vaso low pressure 34 degrees  02/06/24 UBE level 4 x 6 minutes Rows 10# 2x10 Lats 15# 2x10 Wall slides, circles Doorway stretch PAssive stretch to the right shoulder all motions and then the left shoulder all motions STM to the right upper trap and rhomboid and  teres Vaso low pressure 34 degrees  02/02/24 UBE level 4 x 6 minutes 5# rows 15# lats Wall slides 1# circles 1# overhead side to side Wall ball isometric circles Passive stretch to the right shoulder all motions to discomfort STM to the right upper trap and neck Vaso 34 degrees medium pressure  PATIENT EDUCATION: Education details: poc Person educated: Patient Education method: Programmer, Multimedia, Facilities Manager, Actor cues, Verbal cues, and Handouts Education comprehension: verbalized understanding  HOME EXERCISE PROGRAM:  ASSESSMENT:  CLINICAL IMPRESSION:  Patient reports that she saw the shoulder surgeon last week, tentatively the surgery is scheduled for May 29, 2024, the MD notes says to continue PT up until that time to assure ROM and strength, the other issue noted is the left shoulder pain and loss of ROM, she has had tom compensate over the past year due to the right shoulder issues and the left shoulder is now hurting, wants us  to address the left shoulder as well.    Evaluation:  Patient is very frustrated, she had the right reverse TSA last July.  She did very well until around Christmas, then she really started having increased pain and lost some ROM.  She has had blood work done, US , bone scan and CT performed.  There has been some talk of a possible infection, MD feels like she may benefit from a revision.  She would like to try PT again and the MD agreed.  She has lost ROM since I last saw her, she also is having some significant left shoulder pain from over using it.   July 2025: Patient saw surgeon, the bone scan said did not feel that there was infection or loosening however the surgeon is not so sure, he wants to do an US  to be sure, that is scheduled for the next few weeks, since she has not been to PT in a month I re-measured her ROM, she has lost at least 15 degrees of motion and more for all motions.  She is also having more pain in the left  shoulder possibly from  overdoing it, because she is trying to not use the right shoulder.    OBJECTIVE IMPAIRMENTS: cardiopulmonary status limiting activity, decreased activity tolerance, decreased endurance, decreased ROM, decreased strength, increased edema, increased muscle spasms, impaired flexibility, impaired UE functional use, improper body mechanics, postural dysfunction, and pain.  REHAB POTENTIAL: Good  CLINICAL DECISION MAKING: Evolving/moderate complexity  EVALUATION COMPLEXITY: Low   GOALS: Goals reviewed with patient? Yes  SHORT TERM GOALS: Target date: 01/31/24  Independent with initial HEP Goal status: met 01/31/24  LONG TERM GOALS: Target date: 04/15/24  Decrease pain 50% Goal status: ongoing 02/26/24 2.  Dress without difficulty Goal status: ongoing 02/26/24 3.  Do hair without difficulty Goal status: ongoing 02/26/24 4.  Increase AROM right shoulder flexion to 130 degrees Goal status:  5.  Increase right shoulder ER to 60 degrees Goal status: ongoing 02/26/24  6.  Return to water  aerobics and or gym activity Goal status: on going 02/26/24  PLAN:  PT FREQUENCY: 1-2x/week  PT DURATION: 12 weeks  PLANNED INTERVENTIONS: Therapeutic exercises, Therapeutic activity, Neuromuscular re-education, Balance training, Gait training, Patient/Family education, Self Care, Joint mobilization, Dry Needling, Electrical stimulation, Cryotherapy, Vasopneumatic device, and Manual therapy  PLAN FOR NEXT SESSION: Try to regain some of her ROM and function, treat pain as needed, address the left shoulder as well  02/27/2024, 7:59 AM [facility] [address] [facility], [place] [place], [place], [address] Phone: [phone]   Fax:  [phone]

## 2024-03-05 ENCOUNTER — Ambulatory Visit: Admitting: Occupational Therapy

## 2024-03-05 ENCOUNTER — Encounter: Payer: Self-pay | Admitting: Physical Therapy

## 2024-03-05 ENCOUNTER — Ambulatory Visit: Attending: Orthopaedic Surgery | Admitting: Physical Therapy

## 2024-03-05 DIAGNOSIS — M25641 Stiffness of right hand, not elsewhere classified: Secondary | ICD-10-CM | POA: Insufficient documentation

## 2024-03-05 DIAGNOSIS — M25642 Stiffness of left hand, not elsewhere classified: Secondary | ICD-10-CM | POA: Diagnosis not present

## 2024-03-05 DIAGNOSIS — M6281 Muscle weakness (generalized): Secondary | ICD-10-CM | POA: Diagnosis not present

## 2024-03-05 DIAGNOSIS — M79641 Pain in right hand: Secondary | ICD-10-CM | POA: Insufficient documentation

## 2024-03-05 DIAGNOSIS — M79642 Pain in left hand: Secondary | ICD-10-CM | POA: Insufficient documentation

## 2024-03-05 DIAGNOSIS — R252 Cramp and spasm: Secondary | ICD-10-CM | POA: Insufficient documentation

## 2024-03-05 DIAGNOSIS — G8929 Other chronic pain: Secondary | ICD-10-CM | POA: Insufficient documentation

## 2024-03-05 DIAGNOSIS — R278 Other lack of coordination: Secondary | ICD-10-CM | POA: Diagnosis not present

## 2024-03-05 DIAGNOSIS — M542 Cervicalgia: Secondary | ICD-10-CM | POA: Diagnosis not present

## 2024-03-05 DIAGNOSIS — M25511 Pain in right shoulder: Secondary | ICD-10-CM | POA: Diagnosis not present

## 2024-03-05 DIAGNOSIS — M25611 Stiffness of right shoulder, not elsewhere classified: Secondary | ICD-10-CM | POA: Insufficient documentation

## 2024-03-05 NOTE — Therapy (Signed)
 OUTPATIENT OCCUPATIONAL THERAPY ORTHO  treatment  Patient Name: Veronica Galvan MRN: [id number] DOB:1955/07/07, 68 y.o., female Today's Date: 03/05/2024  PCP: Almarie Birmingham, NP REFERRING PROVIDER: Almarie Birmingham, NP  END OF SESSION:     Past Medical History:  Diagnosis Date   Allergy     Anxiety    Arthritis    hands   Cataract    Diabetes mellitus without complication (HCC)    type 2   Family history of adverse reaction to anesthesia    son has malignant hyperthermia, daughter does not daughter recently had c section without problems   GERD (gastroesophageal reflux disease)    Headache    sinus   Hyperlipidemia    Hypertension    PONV (postoperative nausea and vomiting)    nausea only   Ulcer, stomach peptic yrs ago   Past Surgical History:  Procedure Laterality Date   APPENDECTOMY     both hells bone spur repair     both heels with metal clips   both shoulder rotator cuff repair     CESAREAN SECTION     x 1   COLONOSCOPY WITH PROPOFOL  N/A 09/07/2016   Procedure: COLONOSCOPY WITH PROPOFOL ;  Surgeon: Vicci Gladis POUR, MD;  Location: [facility];  Service: Endoscopy;  Laterality: N/A;   colonscopy  06/2011   polyps   ELBOW FRACTURE SURGERY Left    EYE SURGERY     FRACTURE SURGERY     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/10/2022   Procedure: REVERSE SHOULDER ARTHROPLASTY;  Surgeon: Melita Drivers, MD;  Location: [facility];  Service: Orthopedics;  Laterality: Right;  Please follow in room 6 if able   TUBAL LIGATION     VESICO-VAGINAL FISTULA REPAIR     Patient Active Problem List   Diagnosis Date Noted   Aortic atherosclerosis 02/07/2024   Chronic right shoulder pain 02/07/2024   Gastroesophageal reflux disease 11/16/2021   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 08/18/2021   Dermatofibroma 08/18/2021   History of malignant neoplasm of skin 08/18/2021   Lentigo 08/18/2021   Melanocytic nevi of trunk 08/18/2021   Nevus lipomatosus  cutaneous superficialis 08/18/2021   Rosacea 08/18/2021   Actinic keratosis 08/18/2021   Sensorineural hearing loss (SNHL) of left ear with unrestricted hearing of right ear 07/26/2021   Tinnitus of left ear 07/26/2021   Acute recurrent maxillary sinusitis 06/22/2021   Primary hypertension 01/12/2021   Hyperlipidemia associated with type 2 diabetes mellitus (HCC) 01/12/2021   Arthritis 01/12/2021   Type II diabetes mellitus (HCC) 11/25/2019   Ingrown toenail 09/05/2018   Neck pain 01/29/2018   Tick bite 10/24/2017    ONSET DATE: 02/07/24  REFERRING DIAG: M19.90 (ICD-10-CM) - Arthritis      THERAPY DIAG:  Muscle weakness (generalized)  Stiffness of right hand, not elsewhere classified  Pain in right hand  Pain in left hand  Other lack of coordination  Stiffness of left hand, not elsewhere classified  Rationale for Evaluation and Treatment: Rehabilitation  SUBJECTIVE:   SUBJECTIVE STATEMENT: Pt reports her hands are feeling better Pt accompanied by: self  PERTINENT HISTORY: 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 Pt was previously seen for OT and d/c 12/06/23  PRECAUTIONS: hx of R reverse toal shoulder, none for hands    WEIGHT BEARING RESTRICTIONS: No  PAIN:  Are you having pain? Yes: NPRS scale: R hand 6/10, L hand 5/10 Pain location: bilateral  hands Pain description: aching Aggravating factors: overuse, waking up in pain Relieving factors: paraffin Pt has R shoulder pain 6-8/10, PT is addressing, OT will not address., FALLS: Has patient fallen in last 6 months? No  LIVING ENVIRONMENT: Lives with: lives alone Lives in: House/apartment   PLOF: Independent  PATIENT GOALS: decrease pain in hands, improve function/ mobility  NEXT MD VISIT: unknown  OBJECTIVE:  Note: Objective measures were completed at Evaluation unless otherwise noted.  HAND DOMINANCE:  Right  ADLs:Pt reports increased overall difficulty with ADLs due to pain and stiffness. Pt is mod I with basic ADLs. She has difficulty with buttons, styling hair, opening jars.    FUNCTIONAL OUTCOME MEASURES: Upper Extremity Functional Scale (UEFS): 33/80  UPPER EXTREMITY ROM:   RUE composite finger flexion 70% (3cm from palm at middle finger)     LUE composite finer flexion 60% (3.5cm from palm at middle finger) bilateral UE's grossly 90-95% composite finger extension     HAND FUNCTION: Grip strength: Right: 18 lbs; Left: 15 lbs, Lateral pinch: Right: 10 lbs, Left: 12 lbs, and Tip pinch: Right 4 lbs, Left: 2 lbs  COORDINATION:NT   SENSATION: WFL  EDEMA: Pt with bony defomities at PIP joints of all digits which pt reports are bone spurs, Pt also has bony deformity at DIP for right thumb, index and 5th digit and LUE small and index fingers   COGNITION: Overall cognitive status: Within functional limits for tasks assessed   OBSERVATIONS: Pleasant female well known to therpaist from previous occupational therapy at this site   TREATMENT DATE:03/05/24 Paraffin to right hand for pain management x 8 mins, no adverse reactions while therapist performed US  to left hand and digits 3.3 mhz, 0.8 w/cm 2, 20% x 8 mins no adverse reactions. Paraffin to left hand for pain management x 8 mins, no adverse reactions while therapist performed US  to right hand and digits 3.3 mhz, 0.8 w/cm 2, 20% x 8 mins no adverse reactions.P/ROM to digits in flexion individually and compositely.discussed options for self care with pt's upcoming cooking activities: ie use of paddle, and suggestions for when pt has shoulder sugery.   Soft tissue and joint mobs to bilateral hands, wrist and forearms. Grip strengthening with stress tree for resisted grip  02/26/24 Paraffin to right hand for pain management x 8 mins, no adverse reactions while therapist perfromed US  to left hand and digits 3.3 mhz, 0.8 w/cm 2, 20% x  8 mins no adverse reactions. Paraffin to left hand for pain management x 8 mins, no adverse reactions while therapist performed US  to right hand and digits 3.3 mhz, 0.8 w/cm 2, 20% x 8 mins no adverse reactions.P/ROM to digits in flexion  individually and compositely.  Soft tissue and joint mobs to bilateral hands, wrist and forearms.  Discussed improtance of balancing activity and rest in order to management pain and prevent injury.  02/21/24- eval Paraffin to bilateral hands and wrists for pain management x 8 mins, no adverse reactions. P/ROM to digits in flexion  individually and compositely soft tissue mobs to bilateral hands. Pt reports decreased pain end of session.      PATIENT EDUCATION: Education details: see above Person educated: Patient Education method: Explanation Education comprehension: verbalized understanding  HOME EXERCISE PROGRAM: n/a  GOALS: Goals reviewed with patient? Yes  SHORT TERM GOALS: Target date: 03/23/24  I with inital HEP.  Goal status:  ongoing 03/05/24  2.    Pt will improve RUE tip pinch by 2 lbs(from baseline)  for increased ease with daily activities.                                baseline: RUE tip 4lbs, LUE tip 2 lbs                          Goal status: ongoing 03/05/24  3.  Pt will improve LUE tip pinch by 2 lbs(from baseline) for increased ease with daily activities.  baseline: RUE tip 4lbs, LUE tip 2 lbs Goal status: 03/05/24  4.  I with adapted strategies/ adapted equipment to minimize pain and to increase pt I with ADLs/IADLs   Goal status: INITIAL  5.  Pt will report bilateral hand pain no greater than 4/10 for ADLs/IADLS Baseline: RUE 7/10, LUE 6/10 Goal status: ongoing 03/05/24      LONG TERM GOALS: Target date: 05/15/24      Pt will improve RUE grip strength to 22 # or greater for increased functional use Baseline: RUE 18# Goal status: INITIAL  2.  Pt will improve LUE grip strength to 25 # or greater for increased  functional use Baseline: LUE 21# Goal status: INITIAL   3.Pt will demonstrate improved composite finger flexion for ADLs as evidenced by pt. bringing middle fingertip for RUE within 2.75 cm of palm.             Baseline: 3 cm from palm to middle fingertip             Goal status inital    4. .Pt will demonstrate improved composite finger flexion for ADLs as evidenced by pt. bringing middle fingertip for LUE within 3.25 of palm.             Baseline: 3.5 cm from palm to middle fingertip             Goal status: inital     5. check 9 hole peg test and set goals prn ASSESSMENT:  CLINICAL IMPRESSION: Patient is progressing towards goals.Pt demonstrates improved flexibility at end of session. she reports balancing activities and rest better this week.  PERFORMANCE DEFICITS: in functional skills including ADLs, IADLs, coordination, dexterity, proprioception, sensation, edema, ROM, strength, pain, flexibility, Fine motor control, Gross motor control, endurance, decreased knowledge of precautions, decreased knowledge of use of DME, and UE functional use,and psychosocial skills including coping strategies, environmental adaptation, habits, interpersonal interactions, and routines and behaviors.   IMPAIRMENTS: are limiting patient from ADLs, IADLs, rest and sleep, play, leisure, and social participation.   COMORBIDITIES: may have co-morbidities  that affects occupational performance. Patient will benefit from skilled OT to address above impairments and improve overall function.  MODIFICATION OR ASSISTANCE TO COMPLETE EVALUATION: No modification of tasks or assist necessary to complete an evaluation.  OT OCCUPATIONAL PROFILE AND HISTORY: Detailed assessment: Review of records and additional review of physical, cognitive, psychosocial history related to current functional performance.  CLINICAL DECISION MAKING: LOW - limited treatment options, no task modification necessary  REHAB POTENTIAL:  Good  EVALUATION COMPLEXITY: Low      PLAN:  OT FREQUENCY: 1-2x/week  OT DURATION: 12 weeks  PLANNED INTERVENTIONS: 97168 OT Re-evaluation, 97535 self care/ADL training, 02889 therapeutic exercise, 97530 therapeutic activity, 97112 neuromuscular re-education, 97140 manual therapy, 97035 ultrasound, 97018 paraffin, 02989 moist heat, 97010 cryotherapy, 97014 electrical stimulation unattended, 97760 Orthotic Initial, 97763 Orthotic/Prosthetic subsequent, scar mobilization, passive range of motion, functional mobility training, energy  conservation, coping strategies training, patient/family education, and DME and/or AE instructions  RECOMMENDED OTHER SERVICES: n/a  CONSULTED AND AGREED WITH PLAN OF CARE: Patient  PLAN FOR NEXT SESSION: A/ROM HEP, check 9 hole peg test and set goals prn   Dorrie Cocuzza, OT 03/05/2024, 4:14 PM

## 2024-03-05 NOTE — Therapy (Signed)
 OUTPATIENT PHYSICAL THERAPY SHOULDER    Patient Name: Veronica Galvan MRN: [id number] DOB:11-14-1955, 68 y.o., female Today's Date: 03/05/2024  END OF SESSION:  PT End of Session - 03/05/24 0842     Visit Number 12    Date for Recertification  04/15/24    Authorization Type [employer] [place]    PT Start Time [phone]    PT Stop Time 0945    PT Time Calculation (min) 63 min    Activity Tolerance Patient tolerated treatment well    Behavior During Therapy WFL for tasks assessed/performed           Past Medical History:  Diagnosis Date   Allergy     Anxiety    Arthritis    hands   Cataract    Diabetes mellitus without complication (HCC)    type 2   Family history of adverse reaction to anesthesia    son has malignant hyperthermia, daughter does not daughter recently had c section without problems   GERD (gastroesophageal reflux disease)    Headache    sinus   Hyperlipidemia    Hypertension    PONV (postoperative nausea and vomiting)    nausea only   Ulcer, stomach peptic yrs ago   Past Surgical History:  Procedure Laterality Date   APPENDECTOMY     both hells bone spur repair     both heels with metal clips   both shoulder rotator cuff repair     CESAREAN SECTION     x 1   COLONOSCOPY WITH PROPOFOL  N/A 09/07/2016   Procedure: COLONOSCOPY WITH PROPOFOL ;  Surgeon: Vicci Gladis POUR, MD;  Location: [facility];  Service: Endoscopy;  Laterality: N/A;   colonscopy  06/2011   polyps   ELBOW FRACTURE SURGERY Left    EYE SURGERY     FRACTURE SURGERY     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/10/2022   Procedure: REVERSE SHOULDER ARTHROPLASTY;  Surgeon: Melita Drivers, MD;  Location: [facility];  Service: Orthopedics;  Laterality: Right;  Please follow in room 6 if able   TUBAL LIGATION     VESICO-VAGINAL FISTULA REPAIR     Patient Active Problem List   Diagnosis Date Noted   Aortic atherosclerosis 02/07/2024   Chronic right shoulder pain 02/07/2024   Gastroesophageal  reflux disease 11/16/2021   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 08/18/2021   Dermatofibroma 08/18/2021   History of malignant neoplasm of skin 08/18/2021   Lentigo 08/18/2021   Melanocytic nevi of trunk 08/18/2021   Nevus lipomatosus cutaneous superficialis 08/18/2021   Rosacea 08/18/2021   Actinic keratosis 08/18/2021   Sensorineural hearing loss (SNHL) of left ear with unrestricted hearing of right ear 07/26/2021   Tinnitus of left ear 07/26/2021   Acute recurrent maxillary sinusitis 06/22/2021   Primary hypertension 01/12/2021   Hyperlipidemia associated with type 2 diabetes mellitus (HCC) 01/12/2021   Arthritis 01/12/2021   Type II diabetes mellitus (HCC) 11/25/2019   Ingrown toenail 09/05/2018   Neck pain 01/29/2018   Tick bite 10/24/2017    PCP: [name], [name]  REFERRING PROVIDER:[name], MD  REFERRING DIAG: s/p right reverse TSA with complications  THERAPY DIAG:  Muscle weakness (generalized)  Chronic right shoulder pain  Cervicalgia  Spasm  Stiffness of right shoulder, not elsewhere classified  Acute pain of right shoulder  Rationale for Evaluation and Treatment: Rehabilitation  ONSET DATE: 01/01/24  SUBJECTIVE:  SUBJECTIVE STATEMENT:  I am stiff and sore, I am trying to cut back some, but trying to get the surgery set up  Evaluation:  Patient had a fall on her deck, tripped on a board that was sticking up and had a severe fracture of the right proximal humerus, 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 We saw her in PT for many visits.  She had good progress but during a very busy time from Thanksgiving to  Years she started having pain and some regression of motion.  She has had x-rays, bone scan and a CT.  She saw [name] after the CT.   Feels like it could be an infection, PT vs a revision.   11/30/23 Patient reports that she saw the surgeon she is frustrated and upset as she feels that she still has no answers, it is felt that there is no loosening and no infection.  She is still hurting with basic ADL's, her AROM decreased beginning in March, as she started having increased pain in mid January after a very busy and active few month, she has an order for the left shoulder and the okay for us  to see her for her right shoulder for another month PAIN:  Are you having pain? Yes: NPRS scale: 7/10 Pain location: right shoulder, HA, left shoulder pain up to 8/10 with motions Pain description: dull ache at rest, sharp with motions Aggravating factors: quick motions, movements pain up to 10/10 Relieving factors: ice, rest, Tylenol  at best 3/10  PRECAUTIONS: none  RED FLAGS: None   WEIGHT BEARING RESTRICTIONS: No  FALLS:  Has patient fallen in last 6 months? no  LIVING ENVIRONMENT: Lives with: lives alone Lives in: House/apartment Stairs: No Has following equipment at home: None  OCCUPATION: retired  PLOF: Independent, very active with grandkids, does 5 [facility] a week [employer]  PATIENT GOALS:dress without difficulty, do hair, have good ROM and less pain  NEXT MD VISIT: Mid November  OBJECTIVE:   DIAGNOSTIC FINDINGS:  See above  PATIENT SURVEYS:    COGNITION: Overall cognitive status: Within functional limits for tasks assessed     SENSATION: WFL  POSTURE: Fwd head, rounded shoulders, elevated and guarded shoulder  UPPER EXTREMITY ROM:   Active ROM Right AROM  eval Left AROM Eval Right AROM 02/22/24                   AROM 11/30/23  Shoulder flexion 105 125 112                   R 120  Shoulder extension                        Shoulder abduction 85 100 90                   Right 100  Shoulder adduction                        Shoulder internal rotation 0 30                    Right 28  Shoulder  external rotation 46 65  j                    Right 60  Elbow flexion                        Elbow extension                        Wrist flexion                        Wrist extension                        Wrist ulnar deviation                        Wrist radial deviation                        Wrist pronation                        Wrist supination                        (Blank rows = not tested)  UPPER EXTREMITY MMT:  In the available ROM  MMT Right eval Left eval  Shoulder flexion 4- 4-  Shoulder extension    Shoulder abduction    Shoulder adduction    Shoulder internal rotation 4+ 4+  Shoulder external rotation 4-P! 4  Middle trapezius    Lower trapezius    Elbow flexion    Elbow extension    Wrist flexion    Wrist extension    Wrist ulnar deviation    Wrist radial deviation    Wrist pronation    Wrist supination    Grip strength (lbs)    (Blank rows = not tested)  PALPATION:  Very tight and tender in the pectoral, upper trap, the entire right upper arm, slight warmth in the right anterior lateral arm  Positive impingement on the right   TODAY'S TREATMENT:                                                                                                                                          DATE:  03/05/24 Nustep level 5 x 6 minutes 10# straight arm pulls and then 5# 2# slides 2# circles 2# extension Shrugs and upper trap and levator stretches Passive stretch to the right shoulder and the LE's  02/27/24 Nustep level 5 x 6 minutes 20# rows 20# lats 5# chest press 10# biceps 25# triceps 3# extension with wate bar behind the back  3# wate bar OHP to finger ladder LE stretches Passive ROM right shoulder all motions to tolerance   02/26/24 UBE  level 4 x 6 minutes doorway stretch Passive right shoulder stretches STM to the upper traps, neck and rhomboids Passive LE stretches Vaso right shoulder low pressure 34 degrees  02/22/24 UBE level 4 x 6 minutes Rows 20# Lats 20# 5# chest press two positions Doorway stretch 3# wate bar OHP with assist from door frame 3# wate bar extension with PT overpressure Supine 5# isometric circles Supine 5# serratus punch Passive stretch right shoulder all motions to tolerance Vaso low pressure 34 degrees  02/20/24 UBE level 5 x 6 minutes Rows 20# Lats 20# Biceps 5#  Triceps 25# Doorway stretch Passive stretch right shoulder all motions Finger ladder Vaso low pressure right shoulder  02/15/24 UBE x 6 minutes Rows 15#  LAts 15# Chest press 5# Triceps 20# 5# biceps 2# wall  slides, circles, overhead side to side Doorway stretches Passive stretches right UE Vaso low pressure right shoulder  02/13/24 15# rows 2x12 15# lats 2x12 5# chest press 2x12 3# wate bar extension 2# wall slides and circles 10# biceps 20# triceps 5# straight arm pulls Doorway stretch Passive right shoulder stretch all motions  02/08/24 Avoided left shoulder activity due to the injection yesterday PAssive stretch to the right shoulder all motions 3# small chest press 3# serratus push 3# isometric circles 2# supine flexion 1# right ER/IR STM to the upper  traps, rhomboids and into the neck Vaso low pressure 34 degrees  02/06/24 UBE level 4 x 6 minutes Rows 10# 2x10 Lats 15# 2x10 Wall slides, circles Doorway stretch PAssive stretch to the right shoulder all motions and then the left shoulder all motions STM to the right upper trap and rhomboid and teres Vaso low pressure 34 degrees  02/02/24 UBE level 4 x 6 minutes 5# rows 15# lats Wall slides 1# circles 1# overhead side to side Wall ball isometric circles Passive stretch to the right shoulder all motions to discomfort STM to the right upper trap and neck Vaso 34 degrees medium pressure  PATIENT EDUCATION: Education details: poc Person educated: Patient Education method: Programmer, Multimedia, Facilities Manager, Actor cues, Verbal cues, and Handouts Education comprehension: verbalized understanding  HOME EXERCISE PROGRAM:  ASSESSMENT:  CLINICAL IMPRESSION:  Patient reports that she is trying to cut back to help her pain levels, she is trying to get the surgery for May 29, 2024, she has not been able to get this set up today.  the MD notes says to continue PT up until that time to assure ROM and strength, the other issue noted is the left shoulder pain and loss of ROM, she has had tom compensate over the past year due to the right shoulder issues and the left shoulder is now hurting, wants us  to address the left shoulder as well.    Evaluation:  Patient is very frustrated, she had the right reverse TSA last July.  She did very well until around Christmas, then she really started having increased pain and lost some ROM.  She has had blood work done, US , bone scan and CT performed.  There has been some talk of a possible infection, MD feels like she may benefit from a revision.  She would like to try PT again and the MD agreed.  She has lost ROM since I last saw her, she also is having some significant left shoulder pain from over using it.   July 2025: Patient saw surgeon, the bone scan  said did not feel that there was infection or loosening however the surgeon is not so  sure, he wants to do an US  to be sure, that is scheduled for the next few weeks, since she has not been to PT in a month I re-measured her ROM, she has lost at least 15 degrees of motion and more for all motions.  She is also having more pain in the left shoulder possibly from overdoing it, because she is trying to not use the right shoulder.    OBJECTIVE IMPAIRMENTS: cardiopulmonary status limiting activity, decreased activity tolerance, decreased endurance, decreased ROM, decreased strength, increased edema, increased muscle spasms, impaired flexibility, impaired UE functional use, improper body mechanics, postural dysfunction, and pain.  REHAB POTENTIAL: Good  CLINICAL DECISION MAKING: Evolving/moderate complexity  EVALUATION COMPLEXITY: Low   GOALS: Goals reviewed with patient? Yes  SHORT TERM GOALS: Target date: 01/31/24  Independent with initial HEP Goal status: met 01/31/24  LONG TERM GOALS: Target date: 04/15/24  Decrease pain 50% Goal status: ongoing 02/26/24 2.  Dress without difficulty Goal status: ongoing 02/26/24 3.  Do hair without difficulty Goal status: ongoing 02/26/24 4.  Increase AROM right shoulder flexion to 130 degrees Goal status:  5.  Increase right shoulder ER to 60 degrees Goal status: ongoing 02/26/24  6.  Return to water  aerobics and or gym activity Goal status: on going 02/26/24  PLAN:  PT FREQUENCY: 1-2x/week  PT DURATION: 12 weeks  PLANNED INTERVENTIONS: Therapeutic exercises, Therapeutic activity, Neuromuscular re-education, Balance training, Gait training, Patient/Family education, Self Care, Joint mobilization, Dry Needling, Electrical stimulation, Cryotherapy, Vasopneumatic device, and Manual therapy  PLAN FOR NEXT SESSION: Try to regain some of her ROM and function, treat pain as needed, address the left shoulder as well  03/05/2024, 8:42 AM [facility] [place], [place], [address] Phone: [phone]   Fax:  [phone]

## 2024-03-06 ENCOUNTER — Telehealth (HOSPITAL_BASED_OUTPATIENT_CLINIC_OR_DEPARTMENT_OTHER): Payer: Self-pay | Admitting: *Deleted

## 2024-03-06 NOTE — Telephone Encounter (Signed)
   Pre-operative Risk Assessment    Patient Name: Veronica Galvan  DOB: 01/17/1956 MRN: [id number]   Date of last office visit: 02/15/2024 Date of next office visit: None  Request for Surgical Clearance    Procedure:  Right revision total shoulder arthroplasty  Date of Surgery:  Clearance 05/29/24                                 Surgeon:  Dr. Bonner Hair Surgeon's Group or Practice Name:  [facility] Phone number:  [phone] Fax number:  [phone]   Type of Clearance Requested:   - Medical  - Pharmacy:  Hold Aspirin Not indicated   Type of Anesthesia:  General interscalene block   Additional requests/questions:    Signed, Edsel Grayce Sanders   03/06/2024, 1:08 PM

## 2024-03-06 NOTE — Telephone Encounter (Signed)
 Dr. Pietro,  You saw this patient on 02/15/2024. Per protocol we request that you comment on his cardiac risk to proceed with   Right revision total shoulder arthroplasty  since it has been less than 2 months since evaluated in the office. Please send your comment to P CV Pre-Op Pool.  Thank you, Lamarr Satterfield DNP, ANP, AACC.

## 2024-03-07 ENCOUNTER — Ambulatory Visit: Admitting: Physical Therapy

## 2024-03-07 ENCOUNTER — Encounter: Payer: Self-pay | Admitting: Physical Therapy

## 2024-03-07 DIAGNOSIS — M79642 Pain in left hand: Secondary | ICD-10-CM | POA: Diagnosis not present

## 2024-03-07 DIAGNOSIS — M6281 Muscle weakness (generalized): Secondary | ICD-10-CM

## 2024-03-07 DIAGNOSIS — R252 Cramp and spasm: Secondary | ICD-10-CM

## 2024-03-07 DIAGNOSIS — M542 Cervicalgia: Secondary | ICD-10-CM

## 2024-03-07 DIAGNOSIS — M25641 Stiffness of right hand, not elsewhere classified: Secondary | ICD-10-CM | POA: Diagnosis not present

## 2024-03-07 DIAGNOSIS — M25511 Pain in right shoulder: Secondary | ICD-10-CM | POA: Diagnosis not present

## 2024-03-07 DIAGNOSIS — M79641 Pain in right hand: Secondary | ICD-10-CM | POA: Diagnosis not present

## 2024-03-07 DIAGNOSIS — R278 Other lack of coordination: Secondary | ICD-10-CM | POA: Diagnosis not present

## 2024-03-07 DIAGNOSIS — M25611 Stiffness of right shoulder, not elsewhere classified: Secondary | ICD-10-CM | POA: Diagnosis not present

## 2024-03-07 DIAGNOSIS — G8929 Other chronic pain: Secondary | ICD-10-CM

## 2024-03-07 DIAGNOSIS — M25642 Stiffness of left hand, not elsewhere classified: Secondary | ICD-10-CM | POA: Diagnosis not present

## 2024-03-07 NOTE — Telephone Encounter (Signed)
   Patient Name: Veronica Galvan  DOB: 23-Jul-1955 MRN: [id number]  Primary Cardiologist: None  Chart reviewed as part of pre-operative protocol coverage. Patient was recently seen by Dr. Pietro on 02/15/2024. Per Dr. Pietro, patient is okay to proceed with surgery.   Okay to hold Aspirin for 5-7 days if needed. Please resume as soon as safely possible afterwards.   I will route this recommendation to the requesting party via Epic fax function and remove from pre-op pool.  Please call with questions.  Hayli Milligan E Reilly Molchan, PA-C 03/07/2024, 8:21 AM

## 2024-03-07 NOTE — Therapy (Signed)
 OUTPATIENT PHYSICAL THERAPY SHOULDER    Patient Name: Veronica Galvan MRN: [id number] DOB:19-Apr-1956, 68 y.o., female Today's Date: 03/07/2024  END OF SESSION:  PT End of Session - 03/07/24 1355     Visit Number 13    Date for Recertification  04/15/24    Authorization Type [employer] [place]    PT Start Time 1356    PT Stop Time 1455    PT Time Calculation (min) 59 min    Activity Tolerance Patient tolerated treatment well    Behavior During Therapy WFL for tasks assessed/performed           Past Medical History:  Diagnosis Date   Allergy     Anxiety    Arthritis    hands   Cataract    Diabetes mellitus without complication (HCC)    type 2   Family history of adverse reaction to anesthesia    son has malignant hyperthermia, daughter does not daughter recently had c section without problems   GERD (gastroesophageal reflux disease)    Headache    sinus   Hyperlipidemia    Hypertension    PONV (postoperative nausea and vomiting)    nausea only   Ulcer, stomach peptic yrs ago   Past Surgical History:  Procedure Laterality Date   APPENDECTOMY     both hells bone spur repair     both heels with metal clips   both shoulder rotator cuff repair     CESAREAN SECTION     x 1   COLONOSCOPY WITH PROPOFOL  N/A 09/07/2016   Procedure: COLONOSCOPY WITH PROPOFOL ;  Surgeon: Vicci Gladis POUR, MD;  Location: [facility];  Service: Endoscopy;  Laterality: N/A;   colonscopy  06/2011   polyps   ELBOW FRACTURE SURGERY Left    EYE SURGERY     FRACTURE SURGERY     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/10/2022   Procedure: REVERSE SHOULDER ARTHROPLASTY;  Surgeon: Melita Drivers, MD;  Location: [facility];  Service: Orthopedics;  Laterality: Right;  Please follow in room 6 if able   TUBAL LIGATION     VESICO-VAGINAL FISTULA REPAIR     Patient Active Problem List   Diagnosis Date Noted   Aortic atherosclerosis 02/07/2024   Chronic right shoulder pain 02/07/2024   Gastroesophageal  reflux disease 11/16/2021   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 08/18/2021   Dermatofibroma 08/18/2021   History of malignant neoplasm of skin 08/18/2021   Lentigo 08/18/2021   Melanocytic nevi of trunk 08/18/2021   Nevus lipomatosus cutaneous superficialis 08/18/2021   Rosacea 08/18/2021   Actinic keratosis 08/18/2021   Sensorineural hearing loss (SNHL) of left ear with unrestricted hearing of right ear 07/26/2021   Tinnitus of left ear 07/26/2021   Acute recurrent maxillary sinusitis 06/22/2021   Primary hypertension 01/12/2021   Hyperlipidemia associated with type 2 diabetes mellitus (HCC) 01/12/2021   Arthritis 01/12/2021   Type II diabetes mellitus (HCC) 11/25/2019   Ingrown toenail 09/05/2018   Neck pain 01/29/2018   Tick bite 10/24/2017    PCP: [name], [name]  REFERRING PROVIDER:[name], MD  REFERRING DIAG: s/p right reverse TSA with complications  THERAPY DIAG:  Muscle weakness (generalized)  Chronic right shoulder pain  Cervicalgia  Spasm  Stiffness of right shoulder, not elsewhere classified  Rationale for Evaluation and Treatment: Rehabilitation  ONSET DATE: 01/01/24  SUBJECTIVE:  SUBJECTIVE STATEMENT:  I am okay, trying to figure out surgery and how to get better  Evaluation:  Patient had a fall on her deck, tripped on a board that was sticking up and had a severe fracture of the right proximal humerus, 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 We saw her in PT for many visits.  She had good progress but during a very busy time from Thanksgiving to [place] Years she started having pain and some regression of motion.  She has had x-rays, bone scan and a CT.  She saw [name] after the CT.  Feels like it could be an infection, PT vs a  revision.   11/30/23 Patient reports that she saw the surgeon she is frustrated and upset as she feels that she still has no answers, it is felt that there is no loosening and no infection.  She is still hurting with basic ADL's, her AROM decreased beginning in March, as she started having increased pain in mid January after a very busy and active few month, she has an order for the left shoulder and the okay for us  to see her for her right shoulder for another month PAIN:  Are you having pain? Yes: NPRS scale: 7/10 Pain location: right shoulder, HA, left shoulder pain up to 8/10 with motions Pain description: dull ache at rest, sharp with motions Aggravating factors: quick motions, movements pain up to 10/10 Relieving factors: ice, rest, Tylenol  at best 3/10  PRECAUTIONS: none  RED FLAGS: None   WEIGHT BEARING RESTRICTIONS: No  FALLS:  Has patient fallen in last 6 months? no  LIVING ENVIRONMENT: Lives with: lives alone Lives in: House/apartment Stairs: No Has following equipment at home: None  OCCUPATION: retired  PLOF: Independent, very active with grandkids, does 5 [facility] a week [employer]  PATIENT GOALS:dress without difficulty, do hair, have good ROM and less pain  NEXT MD VISIT: Mid November  OBJECTIVE:   DIAGNOSTIC FINDINGS:  See above  PATIENT SURVEYS:    COGNITION: Overall cognitive status: Within functional limits for tasks assessed     SENSATION: WFL  POSTURE: Fwd head, rounded shoulders, elevated and guarded shoulder  UPPER EXTREMITY ROM:   Active ROM Right AROM  eval Left AROM Eval Right AROM 02/22/24                   AROM 11/30/23  Shoulder flexion 105 125 112                   R 120  Shoulder extension                        Shoulder abduction 85 100 90                   Right 100  Shoulder adduction                        Shoulder internal rotation 0 30                    Right 28  Shoulder external rotation 46 65  j                    Right 60  Elbow flexion                        Elbow extension                        Wrist flexion                        Wrist extension                        Wrist ulnar deviation                        Wrist radial deviation                        Wrist pronation                        Wrist supination                        (Blank rows = not tested)  UPPER EXTREMITY MMT:  In the available ROM  MMT Right eval Left eval  Shoulder flexion 4- 4-  Shoulder extension    Shoulder abduction    Shoulder adduction    Shoulder internal rotation 4+ 4+  Shoulder external rotation 4-P! 4  Middle trapezius    Lower trapezius    Elbow flexion    Elbow extension    Wrist flexion    Wrist extension    Wrist ulnar deviation    Wrist radial deviation    Wrist pronation    Wrist supination    Grip strength (lbs)    (Blank rows = not tested)  PALPATION:  Very tight and tender in the pectoral, upper trap, the entire right upper arm, slight warmth in the right anterior lateral arm  Positive impingement on the right   TODAY'S TREATMENT:                                                                                                                                          DATE:  03/07/24 Nustep level 4 x 7 minutes 5# straight arm pulls Ball vs wall 4 positions 15# rows 15# lats Supine 3# isometric circles bilaterally 3# serratus bilaterally Passive stretches bilaterally UE's Vaso medium pressure 34 degrees  03/05/24 Nustep level 5 x 6 minutes 10# straight arm pulls and then 5# 2# slides 2# circles 2# extension Shrugs and upper trap and levator stretches Passive stretch to the right shoulder and the LE's  02/27/24 Nustep level 5 x 6 minutes 20# rows 20#  lats 5# chest press 10# biceps 25# triceps 3# extension with wate bar behind the back  3# wate bar OHP to finger ladder LE stretches Passive ROM right shoulder all motions to tolerance   02/26/24 UBE level 4 x 6 minutes doorway stretch Passive right shoulder stretches STM to the upper traps, neck and rhomboids Passive LE stretches Vaso right shoulder low pressure 34 degrees  02/22/24 UBE level 4 x 6 minutes Rows 20# Lats 20# 5# chest press two positions Doorway stretch 3# wate bar OHP with assist from door frame 3# wate bar extension with PT overpressure Supine 5# isometric circles Supine 5# serratus punch Passive stretch right shoulder all motions to tolerance Vaso low pressure 34 degrees  02/20/24 UBE level 5 x 6 minutes Rows 20# Lats 20# Biceps 5#  Triceps 25# Doorway stretch Passive stretch right shoulder all motions Finger ladder Vaso low pressure right shoulder  02/15/24 UBE x 6 minutes Rows 15#  LAts 15# Chest press 5# Triceps 20# 5# biceps 2# wall  slides, circles, overhead side to side Doorway stretches Passive stretches right UE Vaso low pressure right shoulder  02/13/24 15# rows 2x12 15# lats 2x12 5# chest press 2x12 3# wate bar extension 2# wall slides and circles 10# biceps 20# triceps 5# straight arm pulls Doorway stretch Passive right shoulder stretch all motions  02/08/24 Avoided left shoulder activity due  to the injection yesterday PAssive stretch to the right shoulder all motions 3# small chest press 3# serratus push 3# isometric circles 2# supine flexion 1# right ER/IR STM to the upper traps, rhomboids and into the neck Vaso low pressure 34 degrees  02/06/24 UBE level 4 x 6 minutes Rows 10# 2x10 Lats 15# 2x10 Wall slides, circles Doorway stretch PAssive stretch to the right shoulder all motions and then the left shoulder all motions STM to the right upper trap and rhomboid and teres Vaso low pressure 34 degrees  02/02/24 UBE level 4 x 6 minutes 5# rows 15# lats Wall slides 1# circles 1# overhead side to side Wall ball isometric circles Passive stretch to the right shoulder all motions to discomfort STM to the right upper trap and neck Vaso 34 degrees medium pressure  PATIENT EDUCATION: Education details: poc Person educated: Patient Education method: Programmer, Multimedia, Facilities Manager, Actor cues, Verbal cues, and Handouts Education comprehension: verbalized understanding  HOME EXERCISE PROGRAM:  ASSESSMENT:  CLINICAL IMPRESSION:  Patient reports that she is trying to cut back to help her pain levels, she has the surgery for May 29, 2024, she reports that she is doing 30 baked potatoes today.She is still having issues with the left shoulder ROM and strength and pain.  I did focus on some of the left shoulder ROM and she has a lot of pain.    Evaluation:  Patient is very frustrated, she had the right reverse TSA last July.  She did very well until around Christmas, then she really started having increased pain and lost some ROM.  She has had blood work done, US , bone scan and CT performed.  There has been some talk of a possible infection, MD feels like she may benefit from a revision.  She would like to try PT again and the MD agreed.  She has lost ROM since I last saw her, she also is having some significant left shoulder pain from over using it.   July 2025: Patient saw  surgeon, the bone scan said did not feel that there was infection or loosening however the  surgeon is not so sure, he wants to do an US  to be sure, that is scheduled for the next few weeks, since she has not been to PT in a month I re-measured her ROM, she has lost at least 15 degrees of motion and more for all motions.  She is also having more pain in the left shoulder possibly from overdoing it, because she is trying to not use the right shoulder.    OBJECTIVE IMPAIRMENTS: cardiopulmonary status limiting activity, decreased activity tolerance, decreased endurance, decreased ROM, decreased strength, increased edema, increased muscle spasms, impaired flexibility, impaired UE functional use, improper body mechanics, postural dysfunction, and pain.  REHAB POTENTIAL: Good  CLINICAL DECISION MAKING: Evolving/moderate complexity  EVALUATION COMPLEXITY: Low   GOALS: Goals reviewed with patient? Yes  SHORT TERM GOALS: Target date: 01/31/24  Independent with initial HEP Goal status: met 01/31/24  LONG TERM GOALS: Target date: 04/15/24  Decrease pain 50% Goal status: ongoing 02/26/24 2.  Dress without difficulty Goal status: ongoing 02/26/24 3.  Do hair without difficulty Goal status: ongoing 02/26/24 4.  Increase AROM right shoulder flexion to 130 degrees Goal status:  5.  Increase right shoulder ER to 60 degrees Goal status: ongoing 02/26/24  6.  Return to water  aerobics and or gym activity Goal status: on going 02/26/24  PLAN:  PT FREQUENCY: 1-2x/week  PT DURATION: 12 weeks  PLANNED INTERVENTIONS: Therapeutic exercises, Therapeutic activity, Neuromuscular re-education, Balance training, Gait training, Patient/Family education, Self Care, Joint mobilization, Dry Needling, Electrical stimulation, Cryotherapy, Vasopneumatic device, and Manual therapy  PLAN FOR NEXT SESSION: Try to regain some of her ROM and function, treat pain as needed, address the left shoulder as well  03/07/2024,  1:56 PM [place] [facility], [facility] [place], [place], [address] Phone: [phone]   Fax:  [phone]

## 2024-03-12 ENCOUNTER — Encounter: Payer: Self-pay | Admitting: Physical Therapy

## 2024-03-12 ENCOUNTER — Ambulatory Visit: Admitting: Physical Therapy

## 2024-03-12 DIAGNOSIS — R252 Cramp and spasm: Secondary | ICD-10-CM | POA: Diagnosis not present

## 2024-03-12 DIAGNOSIS — M6281 Muscle weakness (generalized): Secondary | ICD-10-CM | POA: Diagnosis not present

## 2024-03-12 DIAGNOSIS — M79641 Pain in right hand: Secondary | ICD-10-CM | POA: Diagnosis not present

## 2024-03-12 DIAGNOSIS — M25641 Stiffness of right hand, not elsewhere classified: Secondary | ICD-10-CM | POA: Diagnosis not present

## 2024-03-12 DIAGNOSIS — M25642 Stiffness of left hand, not elsewhere classified: Secondary | ICD-10-CM | POA: Diagnosis not present

## 2024-03-12 DIAGNOSIS — M25611 Stiffness of right shoulder, not elsewhere classified: Secondary | ICD-10-CM | POA: Diagnosis not present

## 2024-03-12 DIAGNOSIS — R278 Other lack of coordination: Secondary | ICD-10-CM | POA: Diagnosis not present

## 2024-03-12 DIAGNOSIS — G8929 Other chronic pain: Secondary | ICD-10-CM

## 2024-03-12 DIAGNOSIS — M542 Cervicalgia: Secondary | ICD-10-CM

## 2024-03-12 DIAGNOSIS — M25511 Pain in right shoulder: Secondary | ICD-10-CM | POA: Diagnosis not present

## 2024-03-12 DIAGNOSIS — M79642 Pain in left hand: Secondary | ICD-10-CM | POA: Diagnosis not present

## 2024-03-12 NOTE — Therapy (Signed)
 OUTPATIENT PHYSICAL THERAPY SHOULDER    Patient Name: Veronica Galvan MRN: [id number] DOB:[birth year], 68 y.o., female Today's Date: 03/12/2024  END OF SESSION:  PT End of Session - 03/12/24 0841     Visit Number 14    Date for Recertification  04/15/24    Authorization Type [employer] [place]    Activity Tolerance Patient tolerated treatment well    Behavior During Therapy [facility] for tasks assessed/performed           Past Medical History:  Diagnosis Date   Allergy     Anxiety    Arthritis    hands   Cataract    Diabetes mellitus without complication (HCC)    type 2   Family history of adverse reaction to anesthesia    son has malignant hyperthermia, daughter does not daughter recently had c section without problems   GERD (gastroesophageal reflux disease)    Headache    sinus   Hyperlipidemia    Hypertension    PONV (postoperative nausea and vomiting)    nausea only   Ulcer, stomach peptic yrs ago   Past Surgical History:  Procedure Laterality Date   APPENDECTOMY     both hells bone spur repair     both heels with metal clips   both shoulder rotator cuff repair     CESAREAN SECTION     x 1   COLONOSCOPY WITH PROPOFOL  N/A 09/07/2016   Procedure: COLONOSCOPY WITH PROPOFOL ;  Surgeon: Vicci Gladis POUR, MD;  Location: [facility];  Service: Endoscopy;  Laterality: N/A;   colonscopy  06/2011   polyps   ELBOW FRACTURE SURGERY Left    EYE SURGERY     FRACTURE SURGERY     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/10/2022   Procedure: REVERSE SHOULDER ARTHROPLASTY;  Surgeon: Melita Drivers, MD;  Location: [facility];  Service: Orthopedics;  Laterality: Right;  Please follow in room 6 if able   TUBAL LIGATION     VESICO-VAGINAL FISTULA REPAIR     Patient Active Problem List   Diagnosis Date Noted   Aortic atherosclerosis 02/07/2024   Chronic right shoulder pain 02/07/2024   Gastroesophageal reflux disease 11/16/2021   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 08/18/2021   Dermatofibroma 08/18/2021   History of malignant neoplasm of skin 08/18/2021   Lentigo 08/18/2021   Melanocytic nevi of trunk 08/18/2021   Nevus lipomatosus cutaneous superficialis 08/18/2021   Rosacea 08/18/2021   Actinic keratosis 08/18/2021   Sensorineural hearing loss (SNHL) of left ear with unrestricted hearing of right ear 07/26/2021   Tinnitus of left ear 07/26/2021   Acute recurrent maxillary sinusitis 06/22/2021   Primary hypertension 01/12/2021   Hyperlipidemia associated with type 2 diabetes mellitus (HCC) 01/12/2021   Arthritis 01/12/2021   Type II diabetes mellitus (HCC) 11/25/2019   Ingrown toenail 09/05/2018   Neck pain 01/29/2018   Tick bite 10/24/2017    PCP: [name], [name]  REFERRING PROVIDER:[name], MD  REFERRING DIAG: s/p right reverse TSA with complications  THERAPY DIAG:  Muscle weakness (generalized)  Chronic right shoulder pain  Cervicalgia  Spasm  Stiffness of right shoulder, not elsewhere classified  Rationale for Evaluation and Treatment: Rehabilitation  ONSET DATE: 01/01/24  SUBJECTIVE:  SUBJECTIVE STATEMENT: Still trying to cut back but still struggling with doing stuff  Evaluation:  Patient had a fall on her deck, tripped on a board that was sticking up and had a severe fracture of the right proximal humerus, 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 We saw her in PT for many visits.  She had good progress but during a very busy time from Thanksgiving to  Years she started having pain and some regression of motion.  She has had x-rays, bone scan and a CT.  She saw [name] after the CT.  Feels like it could be an infection, PT vs a revision.   11/30/23 Patient reports that she saw the surgeon she is frustrated and  upset as she feels that she still has no answers, it is felt that there is no loosening and no infection.  She is still hurting with basic ADL's, her AROM decreased beginning in March, as she started having increased pain in mid January after a very busy and active few month, she has an order for the left shoulder and the okay for us  to see her for her right shoulder for another month PAIN:  Are you having pain? Yes: NPRS scale: 7/10 Pain location: right shoulder, HA, left shoulder pain up to 8/10 with motions Pain description: dull ache at rest, sharp with motions Aggravating factors: quick motions, movements pain up to 10/10 Relieving factors: ice, rest, Tylenol  at best 3/10  PRECAUTIONS: none  RED FLAGS: None   WEIGHT BEARING RESTRICTIONS: No  FALLS:  Has patient fallen in last 6 months? no  LIVING ENVIRONMENT: Lives with: lives alone Lives in: House/apartment Stairs: No Has following equipment at home: None  OCCUPATION: retired  PLOF: Independent, very active with grandkids, does 5 classes a week [employer]  PATIENT GOALS:dress without difficulty, do hair, have good ROM and less pain  NEXT MD VISIT: Mid November  OBJECTIVE:   DIAGNOSTIC FINDINGS:  See above  PATIENT SURVEYS:    COGNITION: Overall cognitive status: Within functional limits for tasks assessed     SENSATION: WFL  POSTURE: Fwd head, rounded shoulders, elevated and guarded shoulder  UPPER EXTREMITY ROM:   Active ROM Right AROM  eval Left AROM Eval Right AROM 02/22/24                   AROM 11/30/23  Shoulder flexion 105 125 112                   R 120  Shoulder extension                        Shoulder abduction 85 100 90                   Right 100  Shoulder adduction                        Shoulder internal rotation 0 30                    Right 28  Shoulder external rotation 46 65  j                    Right 60  Elbow flexion                        Elbow extension                        Wrist flexion                        Wrist extension                        Wrist ulnar deviation                        Wrist radial deviation                        Wrist pronation                        Wrist supination                        (Blank rows = not tested)  UPPER EXTREMITY MMT:  In the available ROM  MMT Right eval Left eval  Shoulder flexion 4- 4-  Shoulder extension    Shoulder abduction    Shoulder adduction    Shoulder internal rotation 4+ 4+  Shoulder external rotation 4-P! 4  Middle trapezius    Lower trapezius    Elbow flexion    Elbow extension    Wrist flexion    Wrist extension    Wrist ulnar deviation    Wrist radial deviation    Wrist pronation    Wrist supination    Grip strength (lbs)    (Blank rows = not tested)  PALPATION:  Very tight and tender in the pectoral, upper trap, the entire right upper arm, slight warmth in the right anterior lateral arm  Positive impingement on the right   TODAY'S TREATMENT:                                                                                                                                         DATE:  03/12/24 Rows 20# 2x10 Lats 20# 2x10 Chest press  5# 2x10 UBE level 4 x 6 minutes 3# serratus 3# isometric circles 2# ER/IR Passive strength right shoulder all motions, joint distraction Vaso 34 degrees low pressure right shoulder  03/07/24 Nustep level 4 x 7 minutes 5# straight arm pulls Ball vs wall 4 positions 15# rows 15# lats Supine 3# isometric circles bilaterally 3# serratus bilaterally Passive stretches bilaterally UE's Vaso medium pressure 34 degrees  03/05/24 Nustep level 5 x 6 minutes  10# straight arm pulls and then 5# 2# slides 2# circles 2# extension Shrugs and upper trap and levator stretches Passive stretch to the right shoulder and the LE's  02/27/24 Nustep level 5 x 6 minutes 20# rows 20# lats 5# chest press 10# biceps 25# triceps 3# extension with wate bar behind the back  3# wate bar OHP to finger ladder LE stretches Passive ROM right shoulder all motions to tolerance   02/26/24 UBE level 4 x 6 minutes doorway stretch Passive right shoulder stretches STM to the upper traps, neck and rhomboids Passive LE stretches Vaso right shoulder low pressure 34 degrees  02/22/24 UBE level 4 x 6 minutes Rows 20# Lats 20# 5# chest press two positions Doorway stretch 3# wate bar OHP with assist from door frame 3# wate bar extension with PT overpressure Supine 5# isometric circles Supine 5# serratus punch Passive stretch right shoulder all motions to tolerance Vaso low pressure 34 degrees  02/20/24 UBE level 5 x 6 minutes Rows 20# Lats 20# Biceps 5#  Triceps 25# Doorway stretch Passive stretch right shoulder all motions Finger ladder Vaso low pressure right shoulder  02/15/24 UBE x 6 minutes Rows 15#  LAts 15# Chest press 5# Triceps 20# 5# biceps 2# wall  slides, circles, overhead side to side Doorway stretches Passive stretches right UE Vaso low pressure right shoulder  02/13/24 15# rows 2x12 15# lats 2x12 5# chest press 2x12 3# wate bar extension 2# wall slides and  circles 10# biceps 20# triceps 5# straight arm pulls Doorway stretch Passive right shoulder stretch all motions  02/08/24 Avoided left shoulder activity due to the injection yesterday PAssive stretch to the right shoulder all motions 3# small chest press 3# serratus push 3# isometric circles 2# supine flexion 1# right ER/IR STM to the upper traps, rhomboids and into the neck Vaso low pressure 34 degrees  02/06/24 UBE level 4 x 6 minutes Rows 10# 2x10 Lats 15# 2x10 Wall slides, circles Doorway stretch PAssive stretch to the right shoulder all motions and then the left shoulder all motions STM to the right upper trap and rhomboid and teres Vaso low pressure 34 degrees  02/02/24 UBE level 4 x 6 minutes 5# rows 15# lats Wall slides 1# circles 1# overhead side to side Wall ball isometric circles Passive stretch to the right shoulder all motions to discomfort STM to the right upper trap and neck Vaso 34 degrees medium pressure  PATIENT EDUCATION: Education details: poc Person educated: Patient Education method: Programmer, Multimedia, Facilities Manager, Actor cues, Verbal cues, and Handouts Education comprehension: verbalized understanding  HOME EXERCISE PROGRAM:  ASSESSMENT:  CLINICAL IMPRESSION:  Patient reports that she is trying to cut back to help her pain levels, she has the surgery for May 29, 2024, she allowed very good PROM today, able to relax better with distraction of talking, if not she tends to fight and guard.  Again has issues with the left shoulder as well and it needs to be strong to be able to help the right shoulder after surgery so we will do exercises for both shoulders  Evaluation:  Patient is very frustrated, she had the right reverse TSA last July.  She did very well until around Christmas, then she really started having increased pain and lost some ROM.  She has had blood work done, US , bone scan and CT performed.  There has been some talk of a possible  infection, MD feels like she may benefit from a revision.  She would like  to try PT again and the MD agreed.  She has lost ROM since I last saw her, she also is having some significant left shoulder pain from over using it.   July 2025: Patient saw surgeon, the bone scan said did not feel that there was infection or loosening however the surgeon is not so sure, he wants to do an US  to be sure, that is scheduled for the next few weeks, since she has not been to PT in a month I re-measured her ROM, she has lost at least 15 degrees of motion and more for all motions.  She is also having more pain in the left shoulder possibly from overdoing it, because she is trying to not use the right shoulder.    OBJECTIVE IMPAIRMENTS: cardiopulmonary status limiting activity, decreased activity tolerance, decreased endurance, decreased ROM, decreased strength, increased edema, increased muscle spasms, impaired flexibility, impaired UE functional use, improper body mechanics, postural dysfunction, and pain.  REHAB POTENTIAL: Good  CLINICAL DECISION MAKING: Evolving/moderate complexity  EVALUATION COMPLEXITY: Low   GOALS: Goals reviewed with patient? Yes  SHORT TERM GOALS: Target date: 01/31/24  Independent with initial HEP Goal status: met 01/31/24  LONG TERM GOALS: Target date: 04/15/24  Decrease pain 50% Goal status: ongoing 02/26/24 2.  Dress without difficulty Goal status: ongoing 02/26/24 3.  Do hair without difficulty Goal status: ongoing 02/26/24 4.  Increase AROM right shoulder flexion to 130 degrees Goal status:  5.  Increase right shoulder ER to 60 degrees Goal status: ongoing 02/26/24  6.  Return to water  aerobics and or gym activity Goal status: on going 02/26/24  PLAN:  PT FREQUENCY: 1-2x/week  PT DURATION: 12 weeks  PLANNED INTERVENTIONS: Therapeutic exercises, Therapeutic activity, Neuromuscular re-education, Balance training, Gait training, Patient/Family education, Self Care,  Joint mobilization, Dry Needling, Electrical stimulation, Cryotherapy, Vasopneumatic device, and Manual therapy  PLAN FOR NEXT SESSION: Try to regain some of her ROM and function, treat pain as needed, address the left shoulder as well  03/12/2024, 8:42 AM [place] [facility] [place], [place], [address] Phone: [phone]   Fax:  [phone]

## 2024-03-13 ENCOUNTER — Ambulatory Visit: Admitting: Occupational Therapy

## 2024-03-13 ENCOUNTER — Encounter: Payer: Self-pay | Admitting: Family Medicine

## 2024-03-13 DIAGNOSIS — R278 Other lack of coordination: Secondary | ICD-10-CM | POA: Diagnosis not present

## 2024-03-13 DIAGNOSIS — M542 Cervicalgia: Secondary | ICD-10-CM | POA: Diagnosis not present

## 2024-03-13 DIAGNOSIS — M6281 Muscle weakness (generalized): Secondary | ICD-10-CM | POA: Diagnosis not present

## 2024-03-13 DIAGNOSIS — M79641 Pain in right hand: Secondary | ICD-10-CM | POA: Diagnosis not present

## 2024-03-13 DIAGNOSIS — M79642 Pain in left hand: Secondary | ICD-10-CM | POA: Diagnosis not present

## 2024-03-13 DIAGNOSIS — M25611 Stiffness of right shoulder, not elsewhere classified: Secondary | ICD-10-CM | POA: Diagnosis not present

## 2024-03-13 DIAGNOSIS — M25642 Stiffness of left hand, not elsewhere classified: Secondary | ICD-10-CM | POA: Diagnosis not present

## 2024-03-13 DIAGNOSIS — G8929 Other chronic pain: Secondary | ICD-10-CM | POA: Diagnosis not present

## 2024-03-13 DIAGNOSIS — M25641 Stiffness of right hand, not elsewhere classified: Secondary | ICD-10-CM | POA: Diagnosis not present

## 2024-03-13 DIAGNOSIS — M25511 Pain in right shoulder: Secondary | ICD-10-CM | POA: Diagnosis not present

## 2024-03-13 DIAGNOSIS — R252 Cramp and spasm: Secondary | ICD-10-CM | POA: Diagnosis not present

## 2024-03-13 NOTE — Therapy (Signed)
 OUTPATIENT OCCUPATIONAL THERAPY ORTHO  treatment  Patient Name: Veronica Galvan MRN: [id number] DOB:1955/08/04, 68 y.o., female Today's Date: 03/13/2024  PCP: Almarie Birmingham, NP REFERRING PROVIDER: Almarie Birmingham, NP  END OF SESSION:  OT End of Session - 03/13/24 1117     Visit Number 4    Number of Visits 12    Date for Recertification  05/15/24    Authorization Type [employer] MCR    Authorization - Visit Number 4    Progress Note Due on Visit 10    OT Start Time 1017    OT Stop Time 1100    OT Time Calculation (min) 43 min    Activity Tolerance Patient tolerated treatment well    Behavior During Therapy WFL for tasks assessed/performed            Past Medical History:  Diagnosis Date   Allergy     Anxiety    Arthritis    hands   Cataract    Diabetes mellitus without complication (HCC)    type 2   Family history of adverse reaction to anesthesia    son has malignant hyperthermia, daughter does not daughter recently had c section without problems   GERD (gastroesophageal reflux disease)    Headache    sinus   Hyperlipidemia    Hypertension    PONV (postoperative nausea and vomiting)    nausea only   Ulcer, stomach peptic yrs ago   Past Surgical History:  Procedure Laterality Date   APPENDECTOMY     both hells bone spur repair     both heels with metal clips   both shoulder rotator cuff repair     CESAREAN SECTION     x 1   COLONOSCOPY WITH PROPOFOL  N/A 09/07/2016   Procedure: COLONOSCOPY WITH PROPOFOL ;  Surgeon: Vicci Gladis POUR, MD;  Location: [facility];  Service: Endoscopy;  Laterality: N/A;   colonscopy  06/2011   polyps   ELBOW FRACTURE SURGERY Left    EYE SURGERY     FRACTURE SURGERY     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/10/2022   Procedure: REVERSE SHOULDER ARTHROPLASTY;  Surgeon: Melita Drivers, MD;  Location: [facility];  Service: Orthopedics;  Laterality: Right;  Please follow in room 6 if able   TUBAL LIGATION     VESICO-VAGINAL FISTULA  REPAIR     Patient Active Problem List   Diagnosis Date Noted   Aortic atherosclerosis 02/07/2024   Chronic right shoulder pain 02/07/2024   Gastroesophageal reflux disease 11/16/2021   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 08/18/2021   Dermatofibroma 08/18/2021   History of malignant neoplasm of skin 08/18/2021   Lentigo 08/18/2021   Melanocytic nevi of trunk 08/18/2021   Nevus lipomatosus cutaneous superficialis 08/18/2021   Rosacea 08/18/2021   Actinic keratosis 08/18/2021   Sensorineural hearing loss (SNHL) of left ear with unrestricted hearing of right ear 07/26/2021   Tinnitus of left ear 07/26/2021   Acute recurrent maxillary sinusitis 06/22/2021   Primary hypertension 01/12/2021   Hyperlipidemia associated with type 2 diabetes mellitus (HCC) 01/12/2021   Arthritis 01/12/2021   Type II diabetes mellitus (HCC) 11/25/2019   Ingrown toenail 09/05/2018   Neck pain 01/29/2018   Tick bite 10/24/2017    ONSET DATE: 02/07/24  REFERRING DIAG: M19.90 (ICD-10-CM) - Arthritis      THERAPY DIAG:  Muscle weakness (generalized)  Pain in right hand  Pain in left hand  Other lack of coordination  Stiffness of left hand, not elsewhere  classified  Rationale for Evaluation and Treatment: Rehabilitation  SUBJECTIVE:   SUBJECTIVE STATEMENT: Pt reports her hands are feeling better Pt accompanied by: self  PERTINENT HISTORY: 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 Pt was previously seen for OT and d/c 12/06/23  PRECAUTIONS: hx of R reverse toal shoulder, none for hands    WEIGHT BEARING RESTRICTIONS: No  PAIN:  Are you having pain? Yes: NPRS scale: R hand 6/10, L hand 5/10 Pain location: bilateral hands Pain description: aching Aggravating factors: overuse, waking up in pain Relieving factors: paraffin Pt has R shoulder pain 6-8/10, PT is addressing, OT  will not address., FALLS: Has patient fallen in last 6 months? No  LIVING ENVIRONMENT: Lives with: lives alone Lives in: House/apartment   PLOF: Independent  PATIENT GOALS: decrease pain in hands, improve function/ mobility  NEXT MD VISIT: unknown  OBJECTIVE:  Note: Objective measures were completed at Evaluation unless otherwise noted.  HAND DOMINANCE: Right  ADLs:Pt reports increased overall difficulty with ADLs due to pain and stiffness. Pt is mod I with basic ADLs. She has difficulty with buttons, styling hair, opening jars.    FUNCTIONAL OUTCOME MEASURES: Upper Extremity Functional Scale (UEFS): 33/80  UPPER EXTREMITY ROM:   RUE composite finger flexion 70% (3cm from palm at middle finger)     LUE composite finer flexion 60% (3.5cm from palm at middle finger) bilateral UE's grossly 90-95% composite finger extension     HAND FUNCTION: Grip strength: Right: 18 lbs; Left: 15 lbs, Lateral pinch: Right: 10 lbs, Left: 12 lbs, and Tip pinch: Right 4 lbs, Left: 2 lbs  COORDINATION:NT   SENSATION: WFL  EDEMA: Pt with bony defomities at PIP joints of all digits which pt reports are bone spurs, Pt also has bony deformity at DIP for right thumb, index and 5th digit and LUE small and index fingers   COGNITION: Overall cognitive status: Within functional limits for tasks assessed   OBSERVATIONS: Pleasant female well known to therapist from previous occupational therapy at this site   TREATMENT DATE:11/12/24Paraffin to right hand for pain management x 8 mins, no adverse reactions while therapist performed US  to left hand and digits 3.3 mhz, 0.8 w/cm 2, 20% x 8 mins no adverse reactions. Paraffin to left hand for pain management x 8 mins, no adverse reactions while therapist performed US  to right hand and digits 3.3 mhz, 0.8 w/cm 2, 20% x 8 mins no adverse reactions. Pt reports making changes to her baking schedule and ordering a padddle at therapist recommendation for joint  protection. Soft tissue and joint mobs to bilateral hands, wrist and forearms. P/ROM to digits in flexion individually and compositely. Pt demonstrates improved flexibility end of session.       03/05/24 Paraffin to right hand for pain management x 8 mins, no adverse reactions while therapist performed US  to left hand and digits 3.3 mhz, 0.8 w/cm 2, 20% x 8 mins no adverse reactions. Paraffin to left hand for pain management x 8 mins, no adverse reactions while therapist performed US  to right hand and digits 3.3 mhz, 0.8 w/cm 2, 20% x 8 mins no adverse reactions.P/ROM to digits in flexion individually and compositely.discussed options for self care with pt's upcoming cooking activities: ie use of paddle, and suggestions for when pt has shoulder sugery.   Soft tissue and joint mobs to bilateral hands, wrist and forearms. Grip strengthening with stress tree for resisted  grip  02/26/24 Paraffin to right hand for pain management x 8 mins, no adverse reactions while therapist perfromed US  to left hand and digits 3.3 mhz, 0.8 w/cm 2, 20% x 8 mins no adverse reactions. Paraffin to left hand for pain management x 8 mins, no adverse reactions while therapist performed US  to right hand and digits 3.3 mhz, 0.8 w/cm 2, 20% x 8 mins no adverse reactions.P/ROM to digits in flexion  individually and compositely.  Soft tissue and joint mobs to bilateral hands, wrist and forearms.  Discussed improtance of balancing activity and rest in order to management pain and prevent injury.  02/21/24- eval Paraffin to bilateral hands and wrists for pain management x 8 mins, no adverse reactions. P/ROM to digits in flexion  individually and compositely soft tissue mobs to bilateral hands. Pt reports decreased pain end of session.      PATIENT EDUCATION: Education details: see above Person educated: Patient Education method: Explanation Education comprehension: verbalized understanding  HOME EXERCISE  PROGRAM: n/a  GOALS: Goals reviewed with patient? Yes  SHORT TERM GOALS: Target date: 03/23/24  I with inital HEP.  Goal status:  ongoing 03/13/24  2.    Pt will improve RUE tip pinch by 2 lbs(from baseline) for increased ease with daily activities.                                baseline: RUE tip 4lbs, LUE tip 2 lbs                          Goal status: ongoing 03/13/24  3.  Pt will improve LUE tip pinch by 2 lbs(from baseline) for increased ease with daily activities.  baseline: RUE tip 4lbs, LUE tip 2 lbs Goal status:  ongoing 03/13/24  4.  I with adapted strategies/ adapted equipment to minimize pain and to increase pt I with ADLs/IADLs   Goal status: ongoing, pt has ordered a paddle to use for stirring soup, 03/13/24  5.  Pt will report bilateral hand pain no greater than 4/10 for ADLs/IADLS Baseline: RUE 7/10, LUE 6/10 Goal status: ongoing 03/13/24- pain continues to improve 4-6/10      LONG TERM GOALS: Target date: 05/15/24      Pt will improve RUE grip strength to 22 # or greater for increased functional use Baseline: RUE 18# Goal status: INITIAL  2.  Pt will improve LUE grip strength to 25 # or greater for increased functional use Baseline: LUE 21# Goal status: INITIAL   3.Pt will demonstrate improved composite finger flexion for ADLs as evidenced by pt. bringing middle fingertip for RUE within 2.75 cm of palm.             Baseline: 3 cm from palm to middle fingertip             Goal status inital    4. .Pt will demonstrate improved composite finger flexion for ADLs as evidenced by pt. bringing middle fingertip for LUE within 3.25 of palm.             Baseline: 3.5 cm from palm to middle fingertip             Goal status: inital     5. check 9 hole peg test and set goals prn ASSESSMENT:  CLINICAL IMPRESSION: Patient is progressing towards goals.Pt demonstrates improved flexibility at end of session. She reports that  she has ordered a paddle to use for  stirring soup next week  for improved positioning and less stress on her joints. PERFORMANCE DEFICITS: in functional skills including ADLs, IADLs, coordination, dexterity, proprioception, sensation, edema, ROM, strength, pain, flexibility, Fine motor control, Gross motor control, endurance, decreased knowledge of precautions, decreased knowledge of use of DME, and UE functional use,and psychosocial skills including coping strategies, environmental adaptation, habits, interpersonal interactions, and routines and behaviors.   IMPAIRMENTS: are limiting patient from ADLs, IADLs, rest and sleep, play, leisure, and social participation.   COMORBIDITIES: may have co-morbidities  that affects occupational performance. Patient will benefit from skilled OT to address above impairments and improve overall function.  MODIFICATION OR ASSISTANCE TO COMPLETE EVALUATION: No modification of tasks or assist necessary to complete an evaluation.  OT OCCUPATIONAL PROFILE AND HISTORY: Detailed assessment: Review of records and additional review of physical, cognitive, psychosocial history related to current functional performance.  CLINICAL DECISION MAKING: LOW - limited treatment options, no task modification necessary  REHAB POTENTIAL: Good  EVALUATION COMPLEXITY: Low      PLAN:  OT FREQUENCY: 1-2x/week  OT DURATION: 12 weeks  PLANNED INTERVENTIONS: 97168 OT Re-evaluation, 97535 self care/ADL training, 02889 therapeutic exercise, 97530 therapeutic activity, 97112 neuromuscular re-education, 97140 manual therapy, 97035 ultrasound, 97018 paraffin, 02989 moist heat, 97010 cryotherapy, 97014 electrical stimulation unattended, 97760 Orthotic Initial, 97763 Orthotic/Prosthetic subsequent, scar mobilization, passive range of motion, functional mobility training, energy conservation, coping strategies training, patient/family education, and DME and/or AE instructions  RECOMMENDED OTHER SERVICES: n/a  CONSULTED  AND AGREED WITH PLAN OF CARE: Patient  PLAN FOR NEXT SESSION: A/ROM HEP, paraffin, US    Lavell Ridings, OT 03/13/2024, 11:22 AM

## 2024-03-13 NOTE — Telephone Encounter (Signed)
 Forwarding to Dr. Denyse Amass to review and advise.

## 2024-03-14 ENCOUNTER — Encounter: Payer: Self-pay | Admitting: Physical Therapy

## 2024-03-14 ENCOUNTER — Ambulatory Visit: Admitting: Physical Therapy

## 2024-03-14 DIAGNOSIS — M6281 Muscle weakness (generalized): Secondary | ICD-10-CM

## 2024-03-14 DIAGNOSIS — R252 Cramp and spasm: Secondary | ICD-10-CM | POA: Diagnosis not present

## 2024-03-14 DIAGNOSIS — M25511 Pain in right shoulder: Secondary | ICD-10-CM | POA: Diagnosis not present

## 2024-03-14 DIAGNOSIS — M25642 Stiffness of left hand, not elsewhere classified: Secondary | ICD-10-CM | POA: Diagnosis not present

## 2024-03-14 DIAGNOSIS — M79642 Pain in left hand: Secondary | ICD-10-CM | POA: Diagnosis not present

## 2024-03-14 DIAGNOSIS — R278 Other lack of coordination: Secondary | ICD-10-CM | POA: Diagnosis not present

## 2024-03-14 DIAGNOSIS — M542 Cervicalgia: Secondary | ICD-10-CM | POA: Diagnosis not present

## 2024-03-14 DIAGNOSIS — M25641 Stiffness of right hand, not elsewhere classified: Secondary | ICD-10-CM | POA: Diagnosis not present

## 2024-03-14 DIAGNOSIS — M25611 Stiffness of right shoulder, not elsewhere classified: Secondary | ICD-10-CM

## 2024-03-14 DIAGNOSIS — G8929 Other chronic pain: Secondary | ICD-10-CM | POA: Diagnosis not present

## 2024-03-14 DIAGNOSIS — M79641 Pain in right hand: Secondary | ICD-10-CM | POA: Diagnosis not present

## 2024-03-14 NOTE — Therapy (Signed)
 OUTPATIENT PHYSICAL THERAPY SHOULDER    Patient Name: Veronica Galvan MRN: [id number] DOB:11/04/1955, 68 y.o., female Today's Date: 03/14/2024  END OF SESSION:  PT End of Session - 03/14/24 0842     Visit Number 15    Date for Recertification  04/15/24    Authorization Type [employer] [place]    PT Start Time [phone]    PT Stop Time 0945    PT Time Calculation (min) 63 min    Activity Tolerance Patient tolerated treatment well    Behavior During Therapy WFL for tasks assessed/performed           Past Medical History:  Diagnosis Date   Allergy     Anxiety    Arthritis    hands   Cataract    Diabetes mellitus without complication (HCC)    type 2   Family history of adverse reaction to anesthesia    son has malignant hyperthermia, daughter does not daughter recently had c section without problems   GERD (gastroesophageal reflux disease)    Headache    sinus   Hyperlipidemia    Hypertension    PONV (postoperative nausea and vomiting)    nausea only   Ulcer, stomach peptic yrs ago   Past Surgical History:  Procedure Laterality Date   APPENDECTOMY     both hells bone spur repair     both heels with metal clips   both shoulder rotator cuff repair     CESAREAN SECTION     x 1   COLONOSCOPY WITH PROPOFOL  N/A 09/07/2016   Procedure: COLONOSCOPY WITH PROPOFOL ;  Surgeon: Vicci Gladis POUR, MD;  Location: [facility];  Service: Endoscopy;  Laterality: N/A;   colonscopy  06/2011   polyps   ELBOW FRACTURE SURGERY Left    EYE SURGERY     FRACTURE SURGERY     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/10/2022   Procedure: REVERSE SHOULDER ARTHROPLASTY;  Surgeon: Melita Drivers, MD;  Location: [facility];  Service: Orthopedics;  Laterality: Right;  Please follow in room 6 if able   TUBAL LIGATION     VESICO-VAGINAL FISTULA REPAIR     Patient Active Problem List   Diagnosis Date Noted   Aortic atherosclerosis 02/07/2024   Chronic right shoulder pain 02/07/2024   Gastroesophageal  reflux disease 11/16/2021   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 08/18/2021   Dermatofibroma 08/18/2021   History of malignant neoplasm of skin 08/18/2021   Lentigo 08/18/2021   Melanocytic nevi of trunk 08/18/2021   Nevus lipomatosus cutaneous superficialis 08/18/2021   Rosacea 08/18/2021   Actinic keratosis 08/18/2021   Sensorineural hearing loss (SNHL) of left ear with unrestricted hearing of right ear 07/26/2021   Tinnitus of left ear 07/26/2021   Acute recurrent maxillary sinusitis 06/22/2021   Primary hypertension 01/12/2021   Hyperlipidemia associated with type 2 diabetes mellitus (HCC) 01/12/2021   Arthritis 01/12/2021   Type II diabetes mellitus (HCC) 11/25/2019   Ingrown toenail 09/05/2018   Neck pain 01/29/2018   Tick bite 10/24/2017    PCP: [name], [name]  REFERRING PROVIDER:[name], MD  REFERRING DIAG: s/p right reverse TSA with complications  THERAPY DIAG:  Muscle weakness (generalized)  Chronic right shoulder pain  Cervicalgia  Spasm  Stiffness of right shoulder, not elsewhere classified  Rationale for Evaluation and Treatment: Rehabilitation  ONSET DATE: 01/01/24  SUBJECTIVE:  SUBJECTIVE STATEMENT: I am doing okay  Evaluation:  Patient had a fall on her deck, tripped on a board that was sticking up and had a severe fracture of the right proximal humerus, 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 We saw her in PT for many visits.  She had good progress but during a very busy time from Thanksgiving to [place] Years she started having pain and some regression of motion.  She has had x-rays, bone scan and a CT.  She saw [name] after the CT.  Feels like it could be an infection, PT vs a revision.   11/30/23 Patient reports that she saw the  surgeon she is frustrated and upset as she feels that she still has no answers, it is felt that there is no loosening and no infection.  She is still hurting with basic ADL's, her AROM decreased beginning in March, as she started having increased pain in mid January after a very busy and active few month, she has an order for the left shoulder and the okay for us  to see her for her right shoulder for another month PAIN:  Are you having pain? Yes: NPRS scale: 7/10 Pain location: right shoulder, HA, left shoulder pain up to 8/10 with motions Pain description: dull ache at rest, sharp with motions Aggravating factors: quick motions, movements pain up to 10/10 Relieving factors: ice, rest, Tylenol  at best 3/10  PRECAUTIONS: none  RED FLAGS: None   WEIGHT BEARING RESTRICTIONS: No  FALLS:  Has patient fallen in last 6 months? no  LIVING ENVIRONMENT: Lives with: lives alone Lives in: House/apartment Stairs: No Has following equipment at home: None  OCCUPATION: retired  PLOF: Independent, very active with grandkids, does 5 classes a week [employer]  PATIENT GOALS:dress without difficulty, do hair, have good ROM and less pain  NEXT MD VISIT: Mid November  OBJECTIVE:   DIAGNOSTIC FINDINGS:  See above  PATIENT SURVEYS:    COGNITION: Overall cognitive status: Within functional limits for tasks assessed     SENSATION: WFL  POSTURE: Fwd head, rounded shoulders, elevated and guarded shoulder  UPPER EXTREMITY ROM:   Active ROM Right AROM  eval Left AROM Eval Right AROM 02/22/24                   AROM 11/30/23  Shoulder flexion 105 125 112                   R 120  Shoulder extension                        Shoulder abduction 85 100 90                   Right 100  Shoulder adduction                        Shoulder internal rotation 0 30                    Right 28  Shoulder external rotation 46 65  j                    Right 60  Elbow flexion                        Elbow extension                        Wrist flexion                        Wrist extension                        Wrist ulnar deviation                        Wrist radial deviation                        Wrist pronation                        Wrist supination                        (Blank rows = not tested)  UPPER EXTREMITY MMT:  In the available ROM  MMT Right eval Left eval  Shoulder flexion 4- 4-  Shoulder extension    Shoulder abduction    Shoulder adduction    Shoulder internal rotation 4+ 4+  Shoulder external rotation 4-P! 4  Middle trapezius    Lower trapezius    Elbow flexion    Elbow extension    Wrist flexion    Wrist extension    Wrist ulnar deviation    Wrist radial deviation    Wrist pronation    Wrist supination    Grip strength (lbs)    (Blank rows = not tested)  PALPATION:  Very tight and tender in the pectoral, upper trap, the entire right upper arm, slight warmth in the right anterior lateral arm  Positive impingement on the right   TODAY'S TREATMENT:                                                                                                                                         DATE:  03/14/24 UBE level 4  x 6 minutes Rows 20# Lats 20# 2# wall slides and circles Passive stretch bilateral shoulders 3# wate bar chest press 3# wate bar flexion 5# small chest press, serratus press and isometric circles PNF supine bilateral shoulders Vaso right shoulder 34 degrees  03/12/24 Rows 20# 2x10 Lats 20# 2x10 Chest press 5# 2x10 UBE level 4 x 6 minutes 3# serratus 3# isometric circles 2# ER/IR Passive strength right shoulder all motions, joint distraction Vaso 34 degrees low pressure  right shoulder  03/07/24 Nustep level 4 x 7 minutes 5# straight arm pulls Ball vs wall 4 positions 15# rows 15# lats Supine 3# isometric circles bilaterally 3# serratus bilaterally Passive stretches bilaterally UE's Vaso medium pressure 34 degrees  03/05/24 Nustep level 5 x 6 minutes 10# straight arm pulls and then 5# 2# slides 2# circles 2# extension Shrugs and upper trap and levator stretches Passive stretch to the right shoulder and the LE's  02/27/24 Nustep level 5 x 6 minutes 20# rows 20# lats 5# chest press 10# biceps 25# triceps 3# extension with wate bar behind the back  3# wate bar OHP to finger ladder LE stretches Passive ROM right shoulder all motions to tolerance   02/26/24 UBE level 4 x 6 minutes doorway stretch Passive right shoulder stretches STM to the upper traps, neck and rhomboids Passive LE stretches Vaso right shoulder low pressure 34 degrees  02/22/24 UBE level 4 x 6 minutes Rows 20# Lats 20# 5# chest press two positions Doorway stretch 3# wate bar OHP with assist from door frame 3# wate bar extension with PT overpressure Supine 5# isometric circles Supine 5# serratus punch Passive stretch right shoulder all motions to tolerance Vaso low pressure 34 degrees  02/20/24 UBE level 5 x 6 minutes Rows 20# Lats 20# Biceps 5#  Triceps 25# Doorway stretch Passive stretch right shoulder all motions Finger ladder Vaso low pressure right  shoulder  02/15/24 UBE x 6 minutes Rows 15#  LAts 15# Chest press 5# Triceps 20# 5# biceps 2# wall  slides, circles, overhead side to side Doorway stretches Passive stretches right UE Vaso low pressure right shoulder  02/13/24 15# rows 2x12 15# lats 2x12 5# chest press 2x12 3# wate bar extension 2# wall slides and circles 10# biceps 20# triceps 5# straight arm pulls Doorway stretch Passive right shoulder stretch all motions  02/08/24 Avoided left shoulder activity due to the injection yesterday PAssive stretch to the right shoulder all motions 3# small chest press 3# serratus push 3# isometric circles 2# supine flexion 1# right ER/IR STM to the upper traps, rhomboids and into the neck Vaso low pressure 34 degrees  02/06/24 UBE level 4 x 6 minutes Rows 10# 2x10 Lats 15# 2x10 Wall slides, circles Doorway stretch PAssive stretch to the right shoulder all motions and then the left shoulder all motions STM to the right upper trap and rhomboid and teres Vaso low pressure 34 degrees  02/02/24 UBE level 4 x 6 minutes 5# rows 15# lats Wall slides 1# circles 1# overhead side to side Wall ball isometric circles Passive stretch to the right shoulder all motions to discomfort STM to the right upper trap and neck Vaso 34 degrees medium pressure  PATIENT EDUCATION: Education details: poc Person educated: Patient Education method: Programmer, Multimedia, Facilities Manager, Actor cues, Verbal cues, and Handouts Education comprehension: verbalized understanding  HOME EXERCISE PROGRAM:  ASSESSMENT:  CLINICAL IMPRESSION:  Patient reports that she is doing okay, just sore all the time and reports difficulty reaching things, she has the surgery for May 29, 2024, she allowed very good PROM today.  Again has issues with the left shoulder as well and it needs to be strong to be able to help the right shoulder after surgery so we will do exercises for both shoulders, starting to do  more with both shoulders to prepare for the surgery  Evaluation:  Patient is very frustrated, she had the right reverse TSA last July.  She did very well until around Christmas,  then she really started having increased pain and lost some ROM.  She has had blood work done, US , bone scan and CT performed.  There has been some talk of a possible infection, MD feels like she may benefit from a revision.  She would like to try PT again and the MD agreed.  She has lost ROM since I last saw her, she also is having some significant left shoulder pain from over using it.   July 2025: Patient saw surgeon, the bone scan said did not feel that there was infection or loosening however the surgeon is not so sure, he wants to do an US  to be sure, that is scheduled for the next few weeks, since she has not been to PT in a month I re-measured her ROM, she has lost at least 15 degrees of motion and more for all motions.  She is also having more pain in the left shoulder possibly from overdoing it, because she is trying to not use the right shoulder.    OBJECTIVE IMPAIRMENTS: cardiopulmonary status limiting activity, decreased activity tolerance, decreased endurance, decreased ROM, decreased strength, increased edema, increased muscle spasms, impaired flexibility, impaired UE functional use, improper body mechanics, postural dysfunction, and pain.  REHAB POTENTIAL: Good  CLINICAL DECISION MAKING: Evolving/moderate complexity  EVALUATION COMPLEXITY: Low   GOALS: Goals reviewed with patient? Yes  SHORT TERM GOALS: Target date: 01/31/24  Independent with initial HEP Goal status: met 01/31/24  LONG TERM GOALS: Target date: 04/15/24  Decrease pain 50% Goal status: ongoing 02/26/24 2.  Dress without difficulty Goal status: ongoing 02/26/24 3.  Do hair without difficulty Goal status: progressing 03/14/24 4.  Increase AROM right shoulder flexion to 130 degrees Goal status:  5.  Increase right shoulder ER to 60  degrees Goal status: progressing 03/14/24  6.  Return to water  aerobics and or gym activity Goal status: on going 02/26/24  PLAN:  PT FREQUENCY: 1-2x/week  PT DURATION: 12 weeks  PLANNED INTERVENTIONS: Therapeutic exercises, Therapeutic activity, Neuromuscular re-education, Balance training, Gait training, Patient/Family education, Self Care, Joint mobilization, Dry Needling, Electrical stimulation, Cryotherapy, Vasopneumatic device, and Manual therapy  PLAN FOR NEXT SESSION: Try to regain some of her ROM and function, treat pain as needed, address the left shoulder as well  03/14/2024, 8:42 AM [facility]. [place], [place], [address] Phone: [phone]   Fax:  [phone]

## 2024-03-15 ENCOUNTER — Telehealth: Payer: Self-pay | Admitting: Family Medicine

## 2024-03-15 NOTE — Telephone Encounter (Signed)
 Copied from CRM #[id number]. Topic: Medical Record Request - Provider/Facility Request >> Mar 15, 2024  1:19 PM Ashley R wrote: Reason for CRM: Medical clearance form faxed back yesterday (11/13) with note asking if it needs to be filled out. Ortho office confirms yes it does - only cardiologist clearance received but also need medical clearance from PCP.   Fax [phone] Callback [phone]

## 2024-03-15 NOTE — Telephone Encounter (Signed)
 [name], form placed in your box.

## 2024-03-18 NOTE — Telephone Encounter (Signed)
 Clearance faxed to [facility] with confirmation received.

## 2024-03-18 NOTE — Telephone Encounter (Signed)
 Form completed with instructions for holdig Ozempic  and Synjardy  prior to surgery

## 2024-03-19 ENCOUNTER — Encounter: Payer: Self-pay | Admitting: Physical Therapy

## 2024-03-19 ENCOUNTER — Ambulatory Visit: Admitting: Physical Therapy

## 2024-03-19 DIAGNOSIS — M25511 Pain in right shoulder: Secondary | ICD-10-CM | POA: Diagnosis not present

## 2024-03-19 DIAGNOSIS — R252 Cramp and spasm: Secondary | ICD-10-CM | POA: Diagnosis not present

## 2024-03-19 DIAGNOSIS — M25641 Stiffness of right hand, not elsewhere classified: Secondary | ICD-10-CM | POA: Diagnosis not present

## 2024-03-19 DIAGNOSIS — G8929 Other chronic pain: Secondary | ICD-10-CM

## 2024-03-19 DIAGNOSIS — M542 Cervicalgia: Secondary | ICD-10-CM | POA: Diagnosis not present

## 2024-03-19 DIAGNOSIS — M25611 Stiffness of right shoulder, not elsewhere classified: Secondary | ICD-10-CM | POA: Diagnosis not present

## 2024-03-19 DIAGNOSIS — M79642 Pain in left hand: Secondary | ICD-10-CM | POA: Diagnosis not present

## 2024-03-19 DIAGNOSIS — R278 Other lack of coordination: Secondary | ICD-10-CM | POA: Diagnosis not present

## 2024-03-19 DIAGNOSIS — M79641 Pain in right hand: Secondary | ICD-10-CM | POA: Diagnosis not present

## 2024-03-19 DIAGNOSIS — M6281 Muscle weakness (generalized): Secondary | ICD-10-CM | POA: Diagnosis not present

## 2024-03-19 DIAGNOSIS — M25642 Stiffness of left hand, not elsewhere classified: Secondary | ICD-10-CM | POA: Diagnosis not present

## 2024-03-19 NOTE — Therapy (Signed)
 OUTPATIENT PHYSICAL THERAPY SHOULDER    Patient Name: Veronica Galvan MRN: [id number] DOB:01/08/1956, 68 y.o., female Today's Date: 03/19/2024  END OF SESSION:  PT End of Session - 03/19/24 0830     Visit Number 16    Date for Recertification  04/15/24    Authorization Type [employer] [place]    PT Start Time [phone]    PT Stop Time 0855    PT Time Calculation (min) 61 min    Activity Tolerance Patient tolerated treatment well    Behavior During Therapy WFL for tasks assessed/performed           Past Medical History:  Diagnosis Date   Allergy     Anxiety    Arthritis    hands   Cataract    Diabetes mellitus without complication (HCC)    type 2   Family history of adverse reaction to anesthesia    son has malignant hyperthermia, daughter does not daughter recently had c section without problems   GERD (gastroesophageal reflux disease)    Headache    sinus   Hyperlipidemia    Hypertension    PONV (postoperative nausea and vomiting)    nausea only   Ulcer, stomach peptic yrs ago   Past Surgical History:  Procedure Laterality Date   APPENDECTOMY     both hells bone spur repair     both heels with metal clips   both shoulder rotator cuff repair     CESAREAN SECTION     x 1   COLONOSCOPY WITH PROPOFOL  N/A 09/07/2016   Procedure: COLONOSCOPY WITH PROPOFOL ;  Surgeon: Vicci Gladis POUR, MD;  Location: [facility];  Service: Endoscopy;  Laterality: N/A;   colonscopy  06/2011   polyps   ELBOW FRACTURE SURGERY Left    EYE SURGERY     FRACTURE SURGERY     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/10/2022   Procedure: REVERSE SHOULDER ARTHROPLASTY;  Surgeon: Melita Drivers, MD;  Location: [facility];  Service: Orthopedics;  Laterality: Right;  Please follow in room 6 if able   TUBAL LIGATION     VESICO-VAGINAL FISTULA REPAIR     Patient Active Problem List   Diagnosis Date Noted   Aortic atherosclerosis 02/07/2024   Chronic right shoulder pain 02/07/2024   Gastroesophageal  reflux disease 11/16/2021   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 08/18/2021   Dermatofibroma 08/18/2021   History of malignant neoplasm of skin 08/18/2021   Lentigo 08/18/2021   Melanocytic nevi of trunk 08/18/2021   Nevus lipomatosus cutaneous superficialis 08/18/2021   Rosacea 08/18/2021   Actinic keratosis 08/18/2021   Sensorineural hearing loss (SNHL) of left ear with unrestricted hearing of right ear 07/26/2021   Tinnitus of left ear 07/26/2021   Acute recurrent maxillary sinusitis 06/22/2021   Primary hypertension 01/12/2021   Hyperlipidemia associated with type 2 diabetes mellitus (HCC) 01/12/2021   Arthritis 01/12/2021   Type II diabetes mellitus (HCC) 11/25/2019   Ingrown toenail 09/05/2018   Neck pain 01/29/2018   Tick bite 10/24/2017    PCP: [name], [name]  REFERRING PROVIDER:[name], MD  REFERRING DIAG: s/p right reverse TSA with complications  THERAPY DIAG:  Muscle weakness (generalized)  Chronic right shoulder pain  Cervicalgia  Spasm  Stiffness of right shoulder, not elsewhere classified  Rationale for Evaluation and Treatment: Rehabilitation  ONSET DATE: 01/01/24  SUBJECTIVE:  SUBJECTIVE STATEMENT: I am doing okay, I am trying to break things up and not do so much at one time  Evaluation:  Patient had a fall on her deck, tripped on a board that was sticking up and had a severe fracture of the right proximal humerus, 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 We saw her in PT for many visits.  She had good progress but during a very busy time from Thanksgiving to [place] Years she started having pain and some regression of motion.  She has had x-rays, bone scan and a CT.  She saw [name] after the CT.  Feels like it could be an infection, PT  vs a revision.   11/30/23 Patient reports that she saw the surgeon she is frustrated and upset as she feels that she still has no answers, it is felt that there is no loosening and no infection.  She is still hurting with basic ADL's, her AROM decreased beginning in March, as she started having increased pain in mid January after a very busy and active few month, she has an order for the left shoulder and the okay for us  to see her for her right shoulder for another month PAIN:  Are you having pain? Yes: NPRS scale: 7/10 Pain location: right shoulder, HA, left shoulder pain up to 8/10 with motions Pain description: dull ache at rest, sharp with motions Aggravating factors: quick motions, movements pain up to 10/10 Relieving factors: ice, rest, Tylenol  at best 3/10  PRECAUTIONS: none  RED FLAGS: None   WEIGHT BEARING RESTRICTIONS: No  FALLS:  Has patient fallen in last 6 months? no  LIVING ENVIRONMENT: Lives with: lives alone Lives in: House/apartment Stairs: No Has following equipment at home: None  OCCUPATION: retired  PLOF: Independent, very active with grandkids, does 5 classes a week [employer]  PATIENT GOALS:dress without difficulty, do hair, have good ROM and less pain  NEXT MD VISIT: Mid November  OBJECTIVE:   DIAGNOSTIC FINDINGS:  See above  PATIENT SURVEYS:    COGNITION: Overall cognitive status: Within functional limits for tasks assessed     SENSATION: WFL  POSTURE: Fwd head, rounded shoulders, elevated and guarded shoulder  UPPER EXTREMITY ROM:   Active ROM Right AROM  eval Left AROM Eval Right AROM 02/22/24                   AROM 11/30/23  Shoulder flexion 105 125 112                   R 120  Shoulder extension                        Shoulder abduction 85 100 90                   Right 100  Shoulder adduction                        Shoulder internal rotation 0 30                    Right 28  Shoulder external rotation 46 65  j                    Right 60  Elbow flexion                        Elbow extension                        Wrist flexion                        Wrist extension                        Wrist ulnar deviation                        Wrist radial deviation                        Wrist pronation                        Wrist supination                        (Blank rows = not tested)  UPPER EXTREMITY MMT:  In the available ROM  MMT Right eval Left eval  Shoulder flexion 4- 4-  Shoulder extension    Shoulder abduction    Shoulder adduction    Shoulder internal rotation 4+ 4+  Shoulder external rotation 4-P! 4  Middle trapezius    Lower trapezius    Elbow flexion    Elbow extension    Wrist flexion    Wrist extension    Wrist ulnar deviation    Wrist radial deviation    Wrist pronation    Wrist supination    Grip strength (lbs)    (Blank rows = not tested)  PALPATION:  Very tight and tender in the pectoral, upper trap, the entire right upper arm, slight warmth in the right anterior lateral arm  Positive impingement on the right   TODAY'S TREATMENT:                                                                                                                                          DATE:  03/19/24 UBE level 4 x 6 minutes 20# row 20# lats 5# straight arm pulls 5# chest press 3# serratus and isometric circles 2# wate bar OHP 2# wate bar extensions with PT assist Passive stretch LE's and the right shoulder all motions Vaso low pressure 34 degrees right shoulder  03/14/24 UBE level 4 x 6 minutes Rows 20# Lats 20# 2# wall slides and circles Passive stretch bilateral shoulders 3# wate bar chest press 3# wate bar flexion 5# small chest press, serratus  press and isometric circles PNF supine bilateral shoulders Vaso right shoulder 34 degrees  03/12/24 Rows 20# 2x10 Lats 20# 2x10 Chest press 5# 2x10 UBE level 4 x 6 minutes 3# serratus 3# isometric circles 2# ER/IR Passive strength right shoulder all motions, joint distraction Vaso 34 degrees low pressure right shoulder  03/07/24 Nustep level 4 x 7 minutes 5# straight arm pulls Ball vs wall 4 positions 15# rows 15# lats Supine 3# isometric circles bilaterally 3# serratus bilaterally Passive stretches bilaterally UE's Vaso medium pressure 34 degrees  03/05/24 Nustep level 5 x 6 minutes 10# straight arm pulls and then 5# 2# slides 2# circles 2# extension Shrugs and upper trap and levator stretches Passive stretch to the right shoulder and the LE's  02/27/24 Nustep level 5 x 6 minutes 20# rows 20# lats 5# chest press 10# biceps 25# triceps 3# extension with wate bar behind the back  3# wate bar OHP to finger ladder LE stretches Passive ROM right shoulder all motions to tolerance   02/26/24 UBE level 4 x 6 minutes doorway stretch Passive right shoulder stretches STM to the upper traps, neck and rhomboids Passive LE stretches Vaso right shoulder low pressure 34 degrees  02/22/24 UBE level 4 x 6 minutes Rows 20# Lats 20# 5# chest press two positions Doorway stretch 3# wate bar OHP with assist from door frame 3# wate bar extension with PT  overpressure Supine 5# isometric circles Supine 5# serratus punch Passive stretch right shoulder all motions to tolerance Vaso low pressure 34 degrees  02/20/24 UBE level 5 x 6 minutes Rows 20# Lats 20# Biceps 5#  Triceps 25# Doorway stretch Passive stretch right shoulder all motions Finger ladder Vaso low pressure right shoulder  02/15/24 UBE x 6 minutes Rows 15#  LAts 15# Chest press 5# Triceps 20# 5# biceps 2# wall  slides, circles, overhead side to side Doorway stretches Passive stretches right UE Vaso low pressure right shoulder  02/13/24 15# rows 2x12 15# lats 2x12 5# chest press 2x12 3# wate bar extension 2# wall slides and circles 10# biceps 20# triceps 5# straight arm pulls Doorway stretch Passive right shoulder stretch all motions  02/08/24 Avoided left shoulder activity due to the injection yesterday PAssive stretch to the right shoulder all motions 3# small chest press 3# serratus push 3# isometric circles 2# supine flexion 1# right ER/IR STM to the upper traps, rhomboids and into the neck Vaso low pressure 34 degrees  02/06/24 UBE level 4 x 6 minutes Rows 10# 2x10 Lats 15# 2x10 Wall slides, circles Doorway stretch PAssive stretch to the right shoulder all motions and then the left shoulder all motions STM to the right upper trap and rhomboid and teres Vaso low pressure 34 degrees  02/02/24 UBE level 4 x 6 minutes 5# rows 15# lats Wall slides 1# circles 1# overhead side to side Wall ball isometric circles Passive stretch to the right shoulder all motions to discomfort STM to the right upper trap and neck Vaso 34 degrees medium pressure  PATIENT EDUCATION: Education details: Dealer educated: Patient Education method: Programmer, Multimedia, Facilities Manager, Actor cues, Verbal cues, and Handouts Education comprehension: verbalized understanding  HOME EXERCISE PROGRAM:  ASSESSMENT:  CLINICAL IMPRESSION:  Patient reports that she is  doing okay, she reports that she is trying to back off of some activities and break things down over time to ease the stress and strain.  She is having left shoulder pain as well due to it compensating so much, she has worries  about this and needing the left shoulder to be strong so when she gets the right TSA she can have a good shoulder  Evaluation:  Patient is very frustrated, she had the right reverse TSA last July.  She did very well until around Christmas, then she really started having increased pain and lost some ROM.  She has had blood work done, US , bone scan and CT performed.  There has been some talk of a possible infection, MD feels like she may benefit from a revision.  She would like to try PT again and the MD agreed.  She has lost ROM since I last saw her, she also is having some significant left shoulder pain from over using it.   July 2025: Patient saw surgeon, the bone scan said did not feel that there was infection or loosening however the surgeon is not so sure, he wants to do an US  to be sure, that is scheduled for the next few weeks, since she has not been to PT in a month I re-measured her ROM, she has lost at least 15 degrees of motion and more for all motions.  She is also having more pain in the left shoulder possibly from overdoing it, because she is trying to not use the right shoulder.    OBJECTIVE IMPAIRMENTS: cardiopulmonary status limiting activity, decreased activity tolerance, decreased endurance, decreased ROM, decreased strength, increased edema, increased muscle spasms, impaired flexibility, impaired UE functional use, improper body mechanics, postural dysfunction, and pain.  REHAB POTENTIAL: Good  CLINICAL DECISION MAKING: Evolving/moderate complexity  EVALUATION COMPLEXITY: Low   GOALS: Goals reviewed with patient? Yes  SHORT TERM GOALS: Target date: 01/31/24  Independent with initial HEP Goal status: met 01/31/24  LONG TERM GOALS: Target date:  04/15/24  Decrease pain 50% Goal status: ongoing 02/26/24 2.  Dress without difficulty Goal status: ongoing 02/26/24 3.  Do hair without difficulty Goal status: progressing 03/14/24 4.  Increase AROM right shoulder flexion to 130 degrees Goal status:  5.  Increase right shoulder ER to 60 degrees Goal status: progressing 03/14/24  6.  Return to water  aerobics and or gym activity Goal status: on going 02/26/24  PLAN:  PT FREQUENCY: 1-2x/week  PT DURATION: 12 weeks  PLANNED INTERVENTIONS: Therapeutic exercises, Therapeutic activity, Neuromuscular re-education, Balance training, Gait training, Patient/Family education, Self Care, Joint mobilization, Dry Needling, Electrical stimulation, Cryotherapy, Vasopneumatic device, and Manual therapy  PLAN FOR NEXT SESSION: Try to regain some of her ROM and function, treat pain as needed, address the left shoulder as well, measure ROM  03/19/2024, 8:30 AM [place] [facility] [address] [facility]. [place], [place], [address] Phone: [phone]   Fax:  [phone]

## 2024-03-20 ENCOUNTER — Encounter: Payer: Self-pay | Admitting: Physical Therapy

## 2024-03-20 ENCOUNTER — Ambulatory Visit: Admitting: Occupational Therapy

## 2024-03-20 ENCOUNTER — Ambulatory Visit: Admitting: Physical Therapy

## 2024-03-20 DIAGNOSIS — M25642 Stiffness of left hand, not elsewhere classified: Secondary | ICD-10-CM | POA: Diagnosis not present

## 2024-03-20 DIAGNOSIS — M79641 Pain in right hand: Secondary | ICD-10-CM | POA: Diagnosis not present

## 2024-03-20 DIAGNOSIS — M542 Cervicalgia: Secondary | ICD-10-CM

## 2024-03-20 DIAGNOSIS — M79642 Pain in left hand: Secondary | ICD-10-CM | POA: Diagnosis not present

## 2024-03-20 DIAGNOSIS — M6281 Muscle weakness (generalized): Secondary | ICD-10-CM

## 2024-03-20 DIAGNOSIS — R252 Cramp and spasm: Secondary | ICD-10-CM | POA: Diagnosis not present

## 2024-03-20 DIAGNOSIS — M25511 Pain in right shoulder: Secondary | ICD-10-CM | POA: Diagnosis not present

## 2024-03-20 DIAGNOSIS — R278 Other lack of coordination: Secondary | ICD-10-CM

## 2024-03-20 DIAGNOSIS — M25611 Stiffness of right shoulder, not elsewhere classified: Secondary | ICD-10-CM

## 2024-03-20 DIAGNOSIS — G8929 Other chronic pain: Secondary | ICD-10-CM

## 2024-03-20 DIAGNOSIS — M25641 Stiffness of right hand, not elsewhere classified: Secondary | ICD-10-CM | POA: Diagnosis not present

## 2024-03-20 NOTE — Therapy (Addendum)
 OUTPATIENT OCCUPATIONAL THERAPY ORTHO  treatment  Patient Name: Veronica Galvan MRN: [id number] DOB:[birth year], 68 y.o., female Today's Date: 03/20/2024  PCP: Almarie Birmingham, NP REFERRING PROVIDER: Almarie Birmingham, NP  END OF SESSION:  OT End of Session - 03/20/24 1232     Visit Number 5    Date for Recertification  05/15/24    Authorization Type [employer] M[employer]    Authorization Time Period 12 weeks    Authorization - Visit Number 5    Progress Note Due on Visit 10    OT Start Time 1105    OT Stop Time 1146    OT Time Calculation (min) 41 min    Behavior During Therapy WFL for tasks assessed/performed            Past Medical History:  Diagnosis Date   Allergy     Anxiety    Arthritis    hands   Cataract    Diabetes mellitus without complication (HCC)    type 2   Family history of adverse reaction to anesthesia    son has malignant hyperthermia, daughter does not daughter recently had c section without problems   GERD (gastroesophageal reflux disease)    Headache    sinus   Hyperlipidemia    Hypertension    PONV (postoperative nausea and vomiting)    nausea only   Ulcer, stomach peptic yrs ago   Past Surgical History:  Procedure Laterality Date   APPENDECTOMY     both hells bone spur repair     both heels with metal clips   both shoulder rotator cuff repair     CESAREAN SECTION     x 1   COLONOSCOPY WITH PROPOFOL  N/A 09/07/2016   Procedure: COLONOSCOPY WITH PROPOFOL ;  Surgeon: Vicci Gladis POUR, MD;  Location: [facility];  Service: Endoscopy;  Laterality: N/A;   colonscopy  06/2011   polyps   ELBOW FRACTURE SURGERY Left    EYE SURGERY     FRACTURE SURGERY     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/10/2022   Procedure: REVERSE SHOULDER ARTHROPLASTY;  Surgeon: Melita Drivers, MD;  Location: [facility];  Service: Orthopedics;  Laterality: Right;  Please follow in room 6 if able   TUBAL LIGATION     VESICO-VAGINAL FISTULA REPAIR     Patient Active Problem List    Diagnosis Date Noted   Aortic atherosclerosis 02/07/2024   Chronic right shoulder pain 02/07/2024   Gastroesophageal reflux disease 11/16/2021   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 08/18/2021   Dermatofibroma 08/18/2021   History of malignant neoplasm of skin 08/18/2021   Lentigo 08/18/2021   Melanocytic nevi of trunk 08/18/2021   Nevus lipomatosus cutaneous superficialis 08/18/2021   Rosacea 08/18/2021   Actinic keratosis 08/18/2021   Sensorineural hearing loss (SNHL) of left ear with unrestricted hearing of right ear 07/26/2021   Tinnitus of left ear 07/26/2021   Acute recurrent maxillary sinusitis 06/22/2021   Primary hypertension 01/12/2021   Hyperlipidemia associated with type 2 diabetes mellitus (HCC) 01/12/2021   Arthritis 01/12/2021   Type II diabetes mellitus (HCC) 11/25/2019   Ingrown toenail 09/05/2018   Neck pain 01/29/2018   Tick bite 10/24/2017    ONSET DATE: 02/07/24  REFERRING DIAG: M19.90 (ICD-10-CM) - Arthritis      THERAPY DIAG:  Muscle weakness (generalized)  Stiffness of right shoulder, not elsewhere classified  Pain in right hand  Pain in left hand  Other lack of coordination  Stiffness of left hand, not elsewhere  classified  Stiffness of right hand, not elsewhere classified  Rationale for Evaluation and Treatment: Rehabilitation  SUBJECTIVE:   SUBJECTIVE STATEMENT: Pt reports her hands are feeling better Pt accompanied by: self  PERTINENT HISTORY: 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 Pt was previously seen for OT and d/c 12/06/23  PRECAUTIONS: hx of R reverse toal shoulder, none for hands    WEIGHT BEARING RESTRICTIONS: No  PAIN:  Are you having pain? Yes: NPRS scale: R hand 6/10, L hand 5/10 Pain location: bilateral hands Pain description: aching Aggravating factors: overuse, waking up in pain Relieving  factors: paraffin Pt has R shoulder pain 6-8/10, PT is addressing, OT will not address., FALLS: Has patient fallen in last 6 months? No  LIVING ENVIRONMENT: Lives with: lives alone Lives in: House/apartment   PLOF: Independent  PATIENT GOALS: decrease pain in hands, improve function/ mobility  NEXT MD VISIT: unknown  OBJECTIVE:  Note: Objective measures were completed at Evaluation unless otherwise noted.  HAND DOMINANCE: Right  ADLs:Pt reports increased overall difficulty with ADLs due to pain and stiffness. Pt is mod I with basic ADLs. She has difficulty with buttons, styling hair, opening jars.    FUNCTIONAL OUTCOME MEASURES: Upper Extremity Functional Scale (UEFS): 33/80  UPPER EXTREMITY ROM:   RUE composite finger flexion 70% (3cm from palm at middle finger)     LUE composite finer flexion 60% (3.5cm from palm at middle finger) bilateral UE's grossly 90-95% composite finger extension     HAND FUNCTION: Grip strength: Right: 18 lbs; Left: 15 lbs, Lateral pinch: Right: 10 lbs, Left: 12 lbs, and Tip pinch: Right 4 lbs, Left: 2 lbs  COORDINATION:NT   SENSATION: WFL  EDEMA: Pt with bony defomities at PIP joints of all digits which pt reports are bone spurs, Pt also has bony deformity at DIP for right thumb, index and 5th digit and LUE small and index fingers   COGNITION: Overall cognitive status: Within functional limits for tasks assessed   OBSERVATIONS: Pleasant female well known to therapist from previous occupational therapy at this site   TREATMENT DATE:11/18/25Paraffin to right hand for pain management x 8 mins, no adverse reactions while therapist performed US  to left hand and digits 3.3 mhz, 0.8 w/cm 2, 20% x 8 mins no adverse reactions. Paraffin to left hand for pain management x 8 mins, no adverse reactions while therapist performed US  to right hand and digits 3.3 mhz, 0.8 w/cm 2, 20% x 8 mins no adverse reactions. Pt reports sseking assistance for  cooking soup for church and receiving a padddle at therapist recommendation for joint protection when stirring. Therpist recommends pt coniders delgating the soup task to others in the future or that she seeks additional assist. Soft tissue and joint mobs to bilateral hands, wrist and forearms with lotion. P/ROM to digits in flexion individually and compositely. A/ROM finger thumb opposition as able to bilateral hands  11/12/24Paraffin to right hand for pain management x 8 mins, no adverse reactions while therapist performed US  to left hand and digits 3.3 mhz, 0.8 w/cm 2, 20% x 8 mins no adverse reactions. Paraffin to left hand for pain management x 8 mins, no adverse reactions while therapist performed US  to right hand and digits 3.3 mhz, 0.8 w/cm 2, 20% x 8 mins no adverse reactions. Pt reports making changes to her baking schedule and ordering a padddle at therapist recommendation for joint protection. Soft tissue  and joint mobs to bilateral hands, wrist and forearms. P/ROM to digits in flexion individually and compositely. Pt demonstrates improved flexibility end of session.       03/05/24 Paraffin to right hand for pain management x 8 mins, no adverse reactions while therapist performed US  to left hand and digits 3.3 mhz, 0.8 w/cm 2, 20% x 8 mins no adverse reactions. Paraffin to left hand for pain management x 8 mins, no adverse reactions while therapist performed US  to right hand and digits 3.3 mhz, 0.8 w/cm 2, 20% x 8 mins no adverse reactions.P/ROM to digits in flexion individually and compositely.discussed options for self care with pt's upcoming cooking activities: ie use of paddle, and suggestions for when pt has shoulder sugery.   Soft tissue and joint mobs to bilateral hands, wrist and forearms. Grip strengthening with stress tree for resisted grip  02/26/24 Paraffin to right hand for pain management x 8 mins, no adverse reactions while therapist perfromed US  to left hand and digits  3.3 mhz, 0.8 w/cm 2, 20% x 8 mins no adverse reactions. Paraffin to left hand for pain management x 8 mins, no adverse reactions while therapist performed US  to right hand and digits 3.3 mhz, 0.8 w/cm 2, 20% x 8 mins no adverse reactions.P/ROM to digits in flexion  individually and compositely.  Soft tissue and joint mobs to bilateral hands, wrist and forearms.  Discussed improtance of balancing activity and rest in order to management pain and prevent injury.  02/21/24- eval Paraffin to bilateral hands and wrists for pain management x 8 mins, no adverse reactions. P/ROM to digits in flexion  individually and compositely soft tissue mobs to bilateral hands. Pt reports decreased pain end of session.      PATIENT EDUCATION: Education details: see above Person educated: Patient Education method: Explanation Education comprehension: verbalized understanding  HOME EXERCISE PROGRAM: n/a  GOALS: Goals reviewed with patient? Yes  SHORT TERM GOALS: Target date: 03/23/24  I with inital HEP.  Goal status:  ongoing 03/13/24  2.    Pt will improve RUE tip pinch by 2 lbs(from baseline) for increased ease with daily activities.                                baseline: RUE tip 4lbs, LUE tip 2 lbs                          Goal status: ongoing 03/13/24  3.  Pt will improve LUE tip pinch by 2 lbs(from baseline) for increased ease with daily activities.  baseline: RUE tip 4lbs, LUE tip 2 lbs Goal status:  ongoing 03/13/24  4.  I with adapted strategies/ adapted equipment to minimize pain and to increase pt I with ADLs/IADLs   Goal status: ongoing, pt has ordered a paddle to use for stirring soup, 03/19/24  5.  Pt will report bilateral hand pain no greater than 4/10 for ADLs/IADLS Baseline: RUE 7/10, LUE 6/10 Goal status: ongoing 03/13/24- pain continues to improve 4/10 today 03/19/24, will continue to monitor with activity      LONG TERM GOALS: Target date: 05/15/24      Pt will improve  RUE grip strength to 22 # or greater for increased functional use Baseline: RUE 18# Goal status: INITIAL  2.  Pt will improve LUE grip strength to 25 # or greater for increased functional use Baseline: LUE 21# Goal  status: INITIAL   3.Pt will demonstrate improved composite finger flexion for ADLs as evidenced by pt. bringing middle fingertip for RUE within 2.75 cm of palm.             Baseline: 3 cm from palm to middle fingertip             Goal status inital    4. .Pt will demonstrate improved composite finger flexion for ADLs as evidenced by pt. bringing middle fingertip for LUE within 3.25 of palm.             Baseline: 3.5 cm from palm to middle fingertip             Goal status: inital     5. check 9 hole peg test and set goals prn- ASSESSMENT:  CLINICAL IMPRESSION: Patient is progressing towards goals.Pt demonstrates improved flexibility at end of session. Pt reports she will be cooking soup this week for church in a large quantity, she has sough assistance and ordered a paddle for stirring.PERFORMANCE DEFICITS: in functional skills including ADLs, IADLs, coordination, dexterity, proprioception, sensation, edema, ROM, strength, pain, flexibility, Fine motor control, Gross motor control, endurance, decreased knowledge of precautions, decreased knowledge of use of DME, and UE functional use,and psychosocial skills including coping strategies, environmental adaptation, habits, interpersonal interactions, and routines and behaviors.   IMPAIRMENTS: are limiting patient from ADLs, IADLs, rest and sleep, play, leisure, and social participation.   COMORBIDITIES: may have co-morbidities  that affects occupational performance. Patient will benefit from skilled OT to address above impairments and improve overall function.  MODIFICATION OR ASSISTANCE TO COMPLETE EVALUATION: No modification of tasks or assist necessary to complete an evaluation.  OT OCCUPATIONAL PROFILE AND HISTORY: Detailed  assessment: Review of records and additional review of physical, cognitive, psychosocial history related to current functional performance.  CLINICAL DECISION MAKING: LOW - limited treatment options, no task modification necessary  REHAB POTENTIAL: Good  EVALUATION COMPLEXITY: Low      PLAN:  OT FREQUENCY: 1-2x/week  OT DURATION: 12 weeks  PLANNED INTERVENTIONS: 97168 OT Re-evaluation, 97535 self care/ADL training, 02889 therapeutic exercise, 97530 therapeutic activity, 97112 neuromuscular re-education, 97140 manual therapy, 97035 ultrasound, 97018 paraffin, 02989 moist heat, 97010 cryotherapy, 97014 electrical stimulation unattended, 97760 Orthotic Initial, 97763 Orthotic/Prosthetic subsequent, scar mobilization, passive range of motion, functional mobility training, energy conservation, coping strategies training, patient/family education, and DME and/or AE instructions  RECOMMENDED OTHER SERVICES: n/a  CONSULTED AND AGREED WITH PLAN OF CARE: Patient  PLAN FOR NEXT SESSION: check to see how hands are doing after cooking soup, A/ROM HEP, paraffin, US    Destan Franchini, OT 03/20/2024, 1:10 PM

## 2024-03-20 NOTE — Therapy (Signed)
 OUTPATIENT PHYSICAL THERAPY SHOULDER    Patient Name: Veronica Galvan MRN: [id number] DOB:[birth year], 68 y.o., female Today's Date: 03/20/2024  END OF SESSION:  PT End of Session - 03/20/24 0947     Visit Number 17    Date for Recertification  04/15/24    Authorization Type [employer] [place]    PT Start Time [phone]    PT Stop Time 1030    PT Time Calculation (min) 62 min    Activity Tolerance Patient tolerated treatment well    Behavior During Therapy WFL for tasks assessed/performed           Past Medical History:  Diagnosis Date   Allergy     Anxiety    Arthritis    hands   Cataract    Diabetes mellitus without complication (HCC)    type 2   Family history of adverse reaction to anesthesia    son has malignant hyperthermia, daughter does not daughter recently had c section without problems   GERD (gastroesophageal reflux disease)    Headache    sinus   Hyperlipidemia    Hypertension    PONV (postoperative nausea and vomiting)    nausea only   Ulcer, stomach peptic yrs ago   Past Surgical History:  Procedure Laterality Date   APPENDECTOMY     both hells bone spur repair     both heels with metal clips   both shoulder rotator cuff repair     CESAREAN SECTION     x 1   COLONOSCOPY WITH PROPOFOL  N/A 09/07/2016   Procedure: COLONOSCOPY WITH PROPOFOL ;  Surgeon: Vicci Gladis POUR, MD;  Location: [facility];  Service: Endoscopy;  Laterality: N/A;   colonscopy  06/2011   polyps   ELBOW FRACTURE SURGERY Left    EYE SURGERY     FRACTURE SURGERY     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/10/2022   Procedure: REVERSE SHOULDER ARTHROPLASTY;  Surgeon: Melita Drivers, MD;  Location: [facility];  Service: Orthopedics;  Laterality: Right;  Please follow in room 6 if able   TUBAL LIGATION     VESICO-VAGINAL FISTULA REPAIR     Patient Active Problem List   Diagnosis Date Noted   Aortic atherosclerosis 02/07/2024   Chronic right shoulder pain 02/07/2024   Gastroesophageal  reflux disease 11/16/2021   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 08/18/2021   Dermatofibroma 08/18/2021   History of malignant neoplasm of skin 08/18/2021   Lentigo 08/18/2021   Melanocytic nevi of trunk 08/18/2021   Nevus lipomatosus cutaneous superficialis 08/18/2021   Rosacea 08/18/2021   Actinic keratosis 08/18/2021   Sensorineural hearing loss (SNHL) of left ear with unrestricted hearing of right ear 07/26/2021   Tinnitus of left ear 07/26/2021   Acute recurrent maxillary sinusitis 06/22/2021   Primary hypertension 01/12/2021   Hyperlipidemia associated with type 2 diabetes mellitus (HCC) 01/12/2021   Arthritis 01/12/2021   Type II diabetes mellitus (HCC) 11/25/2019   Ingrown toenail 09/05/2018   Neck pain 01/29/2018   Tick bite 10/24/2017    PCP: [name], [name]  REFERRING PROVIDER:[name], MD  REFERRING DIAG: s/p right reverse TSA with complications  THERAPY DIAG:  Muscle weakness (generalized)  Chronic right shoulder pain  Cervicalgia  Spasm  Stiffness of right shoulder, not elsewhere classified  Rationale for Evaluation and Treatment: Rehabilitation  ONSET DATE: 01/01/24  SUBJECTIVE:  SUBJECTIVE STATEMENT: I am doing okay, I am trying to break things up and not do so much at one time  Evaluation:  Patient had a fall on her deck, tripped on a board that was sticking up and had a severe fracture of the right proximal humerus, 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 We saw her in PT for many visits.  She had good progress but during a very busy time from Thanksgiving to [place] Years she started having pain and some regression of motion.  She has had x-rays, bone scan and a CT.  She saw [name] after the CT.  Feels like it could be an infection, PT  vs a revision.   11/30/23 Patient reports that she saw the surgeon she is frustrated and upset as she feels that she still has no answers, it is felt that there is no loosening and no infection.  She is still hurting with basic ADL's, her AROM decreased beginning in March, as she started having increased pain in mid January after a very busy and active few month, she has an order for the left shoulder and the okay for us  to see her for her right shoulder for another month PAIN:  Are you having pain? Yes: NPRS scale: 7/10 Pain location: right shoulder, HA, left shoulder pain up to 8/10 with motions Pain description: dull ache at rest, sharp with motions Aggravating factors: quick motions, movements pain up to 10/10 Relieving factors: ice, rest, Tylenol  at best 3/10  PRECAUTIONS: none  RED FLAGS: None   WEIGHT BEARING RESTRICTIONS: No  FALLS:  Has patient fallen in last 6 months? no  LIVING ENVIRONMENT: Lives with: lives alone Lives in: House/apartment Stairs: No Has following equipment at home: None  OCCUPATION: retired  PLOF: Independent, very active with grandkids, does 5 [facility] a week [employer]  PATIENT GOALS:dress without difficulty, do hair, have good ROM and less pain  NEXT MD VISIT: Mid November  OBJECTIVE:   DIAGNOSTIC FINDINGS:  See above  PATIENT SURVEYS:    COGNITION: Overall cognitive status: Within functional limits for tasks assessed     SENSATION: WFL  POSTURE: Fwd head, rounded shoulders, elevated and guarded shoulder  UPPER EXTREMITY ROM:   Active ROM Right AROM  eval Left AROM Eval Right AROM 02/22/24 Right  AROM 03/20/24                  AROM 11/30/23  Shoulder flexion 105 125 112 118                  R 120  Shoulder extension                        Shoulder abduction 85 100 90 93                  Right 100  Shoulder adduction                        Shoulder internal rotation 0 30  10                  Right 28  Shoulder external  rotation 46 65  j                    Right 60  Elbow flexion                        Elbow extension                        Wrist flexion                        Wrist extension                        Wrist ulnar deviation                        Wrist radial deviation                        Wrist pronation                        Wrist supination                        (Blank rows = not tested)  UPPER EXTREMITY MMT:  In the available ROM  MMT Right eval Left eval  Shoulder flexion 4- 4-  Shoulder extension    Shoulder abduction    Shoulder adduction    Shoulder internal rotation 4+ 4+  Shoulder external rotation 4-P! 4  Middle trapezius    Lower trapezius    Elbow flexion    Elbow extension    Wrist flexion    Wrist extension    Wrist ulnar deviation    Wrist radial deviation    Wrist pronation    Wrist supination    Grip strength (lbs)    (Blank rows = not tested)  PALPATION:  Very tight and tender in the pectoral, upper trap, the entire right upper arm, slight warmth in the right anterior lateral arm  Positive impingement on the right   TODAY'S TREATMENT:                                                                                                                                          DATE:  03/20/24 UBE level 5 x 6 minutes Passive stretch bilateral UE's, passive stretch LE's STM to the upper traps, neck and rhomboids, some MFR to knots in the upper traps Vaso 34 degrees  03/19/24 UBE level 4 x 6 minutes 20# row 20# lats 5# straight arm pulls 5# chest press 3# serratus and isometric circles 2# wate bar OHP 2# wate bar extensions with PT assist Passive stretch LE's and the right shoulder all motions Vaso low pressure 34 degrees right shoulder  03/14/24 UBE level 4 x 6 minutes Rows 20# Lats 20# 2# wall slides and circles Passive stretch bilateral shoulders 3# wate bar chest press 3# wate bar flexion 5# small chest press, serratus press and isometric circles PNF supine bilateral shoulders Vaso right shoulder 34 degrees  03/12/24 Rows 20# 2x10 Lats 20# 2x10 Chest press 5# 2x10 UBE level 4 x 6 minutes 3# serratus 3# isometric circles 2# ER/IR Passive strength right shoulder all motions, joint distraction Vaso 34 degrees low pressure right shoulder  03/07/24 Nustep level 4 x 7 minutes 5# straight arm pulls Ball vs wall 4 positions 15# rows 15# lats Supine 3# isometric circles bilaterally 3# serratus bilaterally Passive stretches bilaterally UE's Vaso medium pressure 34 degrees  03/05/24 Nustep level 5 x 6 minutes 10# straight arm pulls and then 5# 2# slides 2# circles 2# extension Shrugs and upper trap and levator stretches Passive stretch to the right shoulder and the LE's  02/27/24 Nustep level 5 x 6 minutes 20# rows 20# lats 5# chest press 10# biceps 25# triceps 3# extension with wate bar behind the back  3# wate bar OHP to finger ladder LE stretches Passive ROM right shoulder all motions to tolerance   02/26/24 UBE level 4 x 6 minutes doorway stretch Passive right shoulder stretches STM to the upper traps, neck and rhomboids Passive LE stretches Vaso right  shoulder low pressure 34 degrees  02/22/24 UBE level 4 x 6 minutes Rows 20# Lats 20# 5# chest press two positions Doorway stretch 3# wate bar OHP with assist from door frame 3# wate bar extension with PT overpressure Supine 5# isometric circles Supine 5# serratus punch Passive stretch right shoulder all motions to tolerance Vaso low pressure 34 degrees  02/20/24 UBE level 5 x 6 minutes Rows 20# Lats 20# Biceps 5#  Triceps 25# Doorway stretch Passive stretch right shoulder all motions Finger ladder Vaso low pressure right shoulder  02/15/24 UBE x 6 minutes Rows 15#  LAts 15# Chest press 5# Triceps 20# 5# biceps 2# wall  slides, circles, overhead side to side Doorway stretches Passive stretches right UE Vaso low pressure right shoulder  02/13/24 15# rows 2x12 15# lats 2x12 5# chest press 2x12 3# wate bar extension 2# wall slides and circles 10# biceps 20# triceps 5# straight arm pulls Doorway stretch Passive right shoulder stretch all motions  02/08/24 Avoided left shoulder activity due to the injection yesterday PAssive stretch to the right shoulder all motions 3# small chest press 3# serratus push 3# isometric circles 2# supine flexion 1# right ER/IR STM to the upper traps, rhomboids and into the neck Vaso low pressure 34 degrees  02/06/24 UBE level 4 x 6 minutes Rows 10# 2x10 Lats 15# 2x10 Wall slides, circles Doorway stretch PAssive stretch to the right shoulder all motions and then the left shoulder all motions STM to the right upper trap and rhomboid and teres Vaso low pressure 34 degrees  02/02/24 UBE level 4 x 6 minutes 5# rows 15# lats Wall slides 1# circles 1# overhead side to side Wall ball isometric circles Passive stretch to the right shoulder all motions to discomfort STM to the right upper trap and neck Vaso 34 degrees medium pressure  PATIENT EDUCATION: Education details: poc Person educated: Patient Education method:  Programmer, Multimedia, Facilities Manager, Actor cues, Verbal cues, and Handouts Education comprehension: verbalized understanding  HOME EXERCISE PROGRAM:  ASSESSMENT:  CLINICAL IMPRESSION:  Patient reports that she is doing okay, she reports that she is trying to back  off of some activities and break things down over time to ease the stress and strain thinks this is helping as she is making soup for the community and is doing this over a few days instead of all at one time..She has a lot of knots in the upper traps, neck and rhomboids, her ROM is improving.  She is having left shoulder pain as well due to it compensating so much, she has worries about this and needing the left shoulder to be strong so when she gets the right TSA she can have a good shoulder  Evaluation:  Patient is very frustrated, she had the right reverse TSA last July.  She did very well until around Christmas, then she really started having increased pain and lost some ROM.  She has had blood work done, US , bone scan and CT performed.  There has been some talk of a possible infection, MD feels like she may benefit from a revision.  She would like to try PT again and the MD agreed.  She has lost ROM since I last saw her, she also is having some significant left shoulder pain from over using it.   July 2025: Patient saw surgeon, the bone scan said did not feel that there was infection or loosening however the surgeon is not so sure, he wants to do an US  to be sure, that is scheduled for the next few weeks, since she has not been to PT in a month I re-measured her ROM, she has lost at least 15 degrees of motion and more for all motions.  She is also having more pain in the left shoulder possibly from overdoing it, because she is trying to not use the right shoulder.    OBJECTIVE IMPAIRMENTS: cardiopulmonary status limiting activity, decreased activity tolerance, decreased endurance, decreased ROM, decreased strength, increased edema, increased  muscle spasms, impaired flexibility, impaired UE functional use, improper body mechanics, postural dysfunction, and pain.  REHAB POTENTIAL: Good  CLINICAL DECISION MAKING: Evolving/moderate complexity  EVALUATION COMPLEXITY: Low   GOALS: Goals reviewed with patient? Yes  SHORT TERM GOALS: Target date: 01/31/24  Independent with initial HEP Goal status: met 01/31/24  LONG TERM GOALS: Target date: 04/15/24  Decrease pain 50% Goal status: progressing 03/20/24 2.  Dress without difficulty Goal status: progressing 03/20/24 3.  Do hair without difficulty Goal status: progressing 03/14/24 4.  Increase AROM right shoulder flexion to 130 degrees Goal status:  5.  Increase right shoulder ER to 60 degrees Goal status: progressing 03/14/24  6.  Return to water  aerobics and or gym activity Goal status: on going 02/26/24  PLAN:  PT FREQUENCY: 1-2x/week  PT DURATION: 12 weeks  PLANNED INTERVENTIONS: Therapeutic exercises, Therapeutic activity, Neuromuscular re-education, Balance training, Gait training, Patient/Family education, Self Care, Joint mobilization, Dry Needling, Electrical stimulation, Cryotherapy, Vasopneumatic device, and Manual therapy  PLAN FOR NEXT SESSION: Try to regain some of her ROM and function, treat pain as needed, address the left shoulder as well, measure ROM  03/20/2024, 10:19 AM [place] [facility] [address] [facility] [place], [place], [address] Phone: [phone]   Fax:  [phone]

## 2024-03-20 NOTE — Patient Instructions (Addendum)
      Touch tip of right thumb to tip of each finger in turn, making an O shape. Repeat _10___ times. Do ___1_ sessions per day. Activity: Hold fingers up to eyes to make pretend glasses. Play I spy game.*  Copyright  [employer]. All rights reserved.

## 2024-03-26 ENCOUNTER — Ambulatory Visit: Admitting: Physical Therapy

## 2024-03-26 ENCOUNTER — Ambulatory Visit: Admitting: Occupational Therapy

## 2024-03-26 DIAGNOSIS — M25511 Pain in right shoulder: Secondary | ICD-10-CM | POA: Diagnosis not present

## 2024-03-26 DIAGNOSIS — M79641 Pain in right hand: Secondary | ICD-10-CM | POA: Diagnosis not present

## 2024-03-26 DIAGNOSIS — M25642 Stiffness of left hand, not elsewhere classified: Secondary | ICD-10-CM | POA: Diagnosis not present

## 2024-03-26 DIAGNOSIS — R252 Cramp and spasm: Secondary | ICD-10-CM | POA: Diagnosis not present

## 2024-03-26 DIAGNOSIS — M25611 Stiffness of right shoulder, not elsewhere classified: Secondary | ICD-10-CM

## 2024-03-26 DIAGNOSIS — M6281 Muscle weakness (generalized): Secondary | ICD-10-CM

## 2024-03-26 DIAGNOSIS — M542 Cervicalgia: Secondary | ICD-10-CM | POA: Diagnosis not present

## 2024-03-26 DIAGNOSIS — R278 Other lack of coordination: Secondary | ICD-10-CM | POA: Diagnosis not present

## 2024-03-26 DIAGNOSIS — M25641 Stiffness of right hand, not elsewhere classified: Secondary | ICD-10-CM | POA: Diagnosis not present

## 2024-03-26 DIAGNOSIS — M79642 Pain in left hand: Secondary | ICD-10-CM | POA: Diagnosis not present

## 2024-03-26 DIAGNOSIS — G8929 Other chronic pain: Secondary | ICD-10-CM | POA: Diagnosis not present

## 2024-03-26 NOTE — Therapy (Signed)
 OUTPATIENT PHYSICAL THERAPY SHOULDER    Patient Name: Veronica Galvan MRN: [id number] DOB:Jun 22, 1955, 68 y.o., female Today's Date: 03/26/2024  END OF SESSION:  PT End of Session - 03/26/24 1354     Visit Number 18    Date for Recertification  04/15/24    Authorization Type [employer] [place]    PT Start Time 1355    PT Stop Time 1445    PT Time Calculation (min) 50 min           Past Medical History:  Diagnosis Date   Allergy     Anxiety    Arthritis    hands   Cataract    Diabetes mellitus without complication (HCC)    type 2   Family history of adverse reaction to anesthesia    son has malignant hyperthermia, daughter does not daughter recently had c section without problems   GERD (gastroesophageal reflux disease)    Headache    sinus   Hyperlipidemia    Hypertension    PONV (postoperative nausea and vomiting)    nausea only   Ulcer, stomach peptic yrs ago   Past Surgical History:  Procedure Laterality Date   APPENDECTOMY     both hells bone spur repair     both heels with metal clips   both shoulder rotator cuff repair     CESAREAN SECTION     x 1   COLONOSCOPY WITH PROPOFOL  N/A 09/07/2016   Procedure: COLONOSCOPY WITH PROPOFOL ;  Surgeon: Vicci Gladis POUR, MD;  Location: [facility];  Service: Endoscopy;  Laterality: N/A;   colonscopy  06/2011   polyps   ELBOW FRACTURE SURGERY Left    EYE SURGERY     FRACTURE SURGERY     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/10/2022   Procedure: REVERSE SHOULDER ARTHROPLASTY;  Surgeon: Melita Drivers, MD;  Location: [facility];  Service: Orthopedics;  Laterality: Right;  Please follow in room 6 if able   TUBAL LIGATION     VESICO-VAGINAL FISTULA REPAIR     Patient Active Problem List   Diagnosis Date Noted   Aortic atherosclerosis 02/07/2024   Chronic right shoulder pain 02/07/2024   Gastroesophageal reflux disease 11/16/2021   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 08/18/2021   Dermatofibroma  08/18/2021   History of malignant neoplasm of skin 08/18/2021   Lentigo 08/18/2021   Melanocytic nevi of trunk 08/18/2021   Nevus lipomatosus cutaneous superficialis 08/18/2021   Rosacea 08/18/2021   Actinic keratosis 08/18/2021   Sensorineural hearing loss (SNHL) of left ear with unrestricted hearing of right ear 07/26/2021   Tinnitus of left ear 07/26/2021   Acute recurrent maxillary sinusitis 06/22/2021   Primary hypertension 01/12/2021   Hyperlipidemia associated with type 2 diabetes mellitus (HCC) 01/12/2021   Arthritis 01/12/2021   Type II diabetes mellitus (HCC) 11/25/2019   Ingrown toenail 09/05/2018   Neck pain 01/29/2018   Tick bite 10/24/2017    PCP: [name], [name]  REFERRING PROVIDER:[name], MD  REFERRING DIAG: s/p right reverse TSA with complications  THERAPY DIAG:  Muscle weakness (generalized)  Stiffness of right shoulder, not elsewhere classified  Rationale for Evaluation and Treatment: Rehabilitation  ONSET DATE: 01/01/24  SUBJECTIVE:  SUBJECTIVE STATEMENT:  left one is bad one, so bad keeping me up at night    Evaluation:  Patient had a fall on her deck, tripped on a board that was sticking up and had a severe fracture of the right proximal humerus, 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 We saw her in PT for many visits.  She had good progress but during a very busy time from Thanksgiving to [place] she started having pain and some regression of motion.  She has had x-rays, bone scan and a CT.  She saw [name] after the CT.  Feels like it could be an infection, PT vs a revision.   11/30/23 Patient reports that she saw the surgeon she is frustrated and upset as she feels that she still has no answers, it is felt that there is no loosening and no  infection.  She is still hurting with basic ADL's, her AROM decreased beginning in March, as she started having increased pain in mid January after a very busy and active few month, she has an order for the left shoulder and the okay for us  to see her for her right shoulder for another month PAIN:  Are you having pain? Yes: NPRS scale: 7/10 Pain location: right shoulder, HA, left shoulder pain up to 8/10 with motions Pain description: dull ache at rest, sharp with motions Aggravating factors: quick motions, movements pain up to 10/10 Relieving factors: ice, rest, Tylenol  at best 3/10  PRECAUTIONS: none  RED FLAGS: None   WEIGHT BEARING RESTRICTIONS: No  FALLS:  Has patient fallen in last 6 months? no  LIVING ENVIRONMENT: Lives with: lives alone Lives in: House/apartment Stairs: No Has following equipment at home: None  OCCUPATION: retired  PLOF: Independent, very active with grandkids, does 5 classes a week [employer]  PATIENT GOALS:dress without difficulty, do hair, have good ROM and less pain  NEXT MD VISIT: Mid November  OBJECTIVE:   DIAGNOSTIC FINDINGS:  See above  PATIENT SURVEYS:    COGNITION: Overall cognitive status: Within functional limits for tasks assessed     SENSATION: WFL  POSTURE: Fwd head, rounded shoulders, elevated and guarded shoulder  UPPER EXTREMITY ROM:   Active ROM Right AROM  eval Left AROM Eval Right AROM 02/22/24 Right  AROM 03/20/24                  AROM 11/30/23  Shoulder flexion 105 125 112 118                  R 120  Shoulder extension                        Shoulder abduction 85 100 90 93                  Right 100  Shoulder adduction                        Shoulder internal rotation 0 30  10                  Right 28  Shoulder external rotation 46 65  j                    Right 60  Elbow flexion                        Elbow extension                        Wrist flexion                        Wrist extension                        Wrist ulnar deviation                        Wrist radial deviation                        Wrist pronation                        Wrist supination                        (Blank rows = not tested)  UPPER EXTREMITY MMT:  In the available ROM  MMT Right eval Left eval  Shoulder flexion 4- 4-  Shoulder extension    Shoulder abduction    Shoulder adduction    Shoulder internal rotation 4+ 4+  Shoulder external rotation 4-P! 4  Middle trapezius    Lower trapezius    Elbow flexion    Elbow extension    Wrist flexion    Wrist extension    Wrist ulnar deviation    Wrist radial deviation    Wrist pronation    Wrist supination    Grip strength (lbs)    (Blank rows = not tested)  PALPATION:  Very tight and tender in the pectoral, upper trap, the entire right upper arm, slight warmth in the right anterior lateral arm  Positive impingement on the right   TODAY'S TREATMENT:                                                                                                                                         DATE:   03/26/24 UBE L 2 each way Passive stretch bilateral UE's STM to the upper traps, neck and rhomboids, some  MFR to knots in the upper traps Vaso 34 degrees    03/20/24 UBE level 5 x 6 minutes Passive stretch bilateral UE's, passive stretch LE's STM to the upper traps, neck and rhomboids, some MFR to knots in the upper traps Vaso 34 degrees  03/19/24 UBE level 4 x 6 minutes 20# row 20# lats 5# straight arm pulls 5# chest  press 3# serratus and isometric circles 2# wate bar OHP 2# wate bar extensions with PT assist Passive stretch LE's and the right shoulder all motions Vaso low pressure 34 degrees right shoulder  03/14/24 UBE level 4 x 6 minutes Rows 20# Lats 20# 2# wall slides and circles Passive stretch bilateral shoulders 3# wate bar chest press 3# wate bar flexion 5# small chest press, serratus press and isometric circles PNF supine bilateral shoulders Vaso right shoulder 34 degrees  03/12/24 Rows 20# 2x10 Lats 20# 2x10 Chest press 5# 2x10 UBE level 4 x 6 minutes 3# serratus 3# isometric circles 2# ER/IR Passive strength right shoulder all motions, joint distraction Vaso 34 degrees low pressure right shoulder  03/07/24 Nustep level 4 x 7 minutes 5# straight arm pulls Ball vs wall 4 positions 15# rows 15# lats Supine 3# isometric circles bilaterally 3# serratus bilaterally Passive stretches bilaterally UE's Vaso medium pressure 34 degrees  03/05/24 Nustep level 5 x 6 minutes 10# straight arm pulls and then 5# 2# slides 2# circles 2# extension Shrugs and upper trap and levator stretches Passive stretch to the right shoulder and the LE's  02/27/24 Nustep level 5 x 6 minutes 20# rows 20# lats 5# chest press 10# biceps 25# triceps 3# extension with wate bar behind the back  3# wate bar OHP to finger ladder LE stretches Passive ROM right shoulder all motions to tolerance   02/26/24 UBE level 4 x 6 minutes doorway stretch Passive right shoulder stretches STM to the upper traps, neck and rhomboids Passive LE stretches Vaso right shoulder low  pressure 34 degrees  02/22/24 UBE level 4 x 6 minutes Rows 20# Lats 20# 5# chest press two positions Doorway stretch 3# wate bar OHP with assist from door frame 3# wate bar extension with PT overpressure Supine 5# isometric circles Supine 5# serratus punch Passive stretch right shoulder all motions to tolerance Vaso low pressure 34 degrees  02/20/24 UBE level 5 x 6 minutes Rows 20# Lats 20# Biceps 5#  Triceps 25# Doorway stretch Passive stretch right shoulder all motions Finger ladder Vaso low pressure right shoulder  02/15/24 UBE x 6 minutes Rows 15#  LAts 15# Chest press 5# Triceps 20# 5# biceps 2# wall  slides, circles, overhead side to side Doorway stretches Passive stretches right UE Vaso low pressure right shoulder  02/13/24 15# rows 2x12 15# lats 2x12 5# chest press 2x12 3# wate bar extension 2# wall slides and circles 10# biceps 20# triceps 5# straight arm pulls Doorway stretch Passive right shoulder stretch all motions  02/08/24 Avoided left shoulder activity due to the injection yesterday PAssive stretch to the right shoulder all motions 3# small chest press 3# serratus push 3# isometric circles 2# supine flexion 1# right ER/IR STM to the upper traps, rhomboids and into the neck Vaso low pressure 34 degrees  02/06/24 UBE level 4 x 6 minutes Rows 10# 2x10 Lats 15# 2x10 Wall slides, circles Doorway stretch PAssive stretch to the right shoulder all motions and then the left shoulder all motions STM to the right upper trap and rhomboid and teres Vaso low pressure 34 degrees  02/02/24 UBE level 4 x 6 minutes 5# rows 15# lats Wall slides 1# circles 1# overhead side to side Wall ball isometric circles Passive stretch to the right shoulder all motions to discomfort STM to the right upper trap and neck Vaso 34 degrees medium pressure  PATIENT EDUCATION: Education details: poc Person educated: Patient Education method: Programmer, Multimedia,  Facilities Manager, Actor  cues, Verbal cues, and Handouts Education comprehension: verbalized understanding  HOME EXERCISE PROGRAM:  ASSESSMENT:  CLINICAL IMPRESSION:  pnt arrives with increased pain left shld and it keeping her up at night. AROM fair with slow ROM. Very tight in UT and rhomboids ,tolerated STW and MFR well and with reliefi. Goals assessed and slow progress d/t pain and ROM limitations . Trying to maximize func prior to late Jan surgery on RT shld.    Patient reports that she is doing okay, she reports that she is trying to back off of some activities and break things down over time to ease the stress and strain thinks this is helping as she is making soup for the community and is doing this over a few days instead of all at one time..She has a lot of knots in the upper traps, neck and rhomboids, her ROM is improving.  She is having left shoulder pain as well due to it compensating so much, she has worries about this and needing the left shoulder to be strong so when she gets the right TSA she can have a good shoulder  Evaluation:  Patient is very frustrated, she had the right reverse TSA last July.  She did very well until around Christmas, then she really started having increased pain and lost some ROM.  She has had blood work done, US , bone scan and CT performed.  There has been some talk of a possible infection, MD feels like she may benefit from a revision.  She would like to try PT again and the MD agreed.  She has lost ROM since I last saw her, she also is having some significant left shoulder pain from over using it.   July 2025: Patient saw surgeon, the bone scan said did not feel that there was infection or loosening however the surgeon is not so sure, he wants to do an US  to be sure, that is scheduled for the next few weeks, since she has not been to PT in a month I re-measured her ROM, she has lost at least 15 degrees of motion and more for all motions.  She is also having  more pain in the left shoulder possibly from overdoing it, because she is trying to not use the right shoulder.    OBJECTIVE IMPAIRMENTS: cardiopulmonary status limiting activity, decreased activity tolerance, decreased endurance, decreased ROM, decreased strength, increased edema, increased muscle spasms, impaired flexibility, impaired UE functional use, improper body mechanics, postural dysfunction, and pain.  REHAB POTENTIAL: Good  CLINICAL DECISION MAKING: Evolving/moderate complexity  EVALUATION COMPLEXITY: Low   GOALS: Goals reviewed with patient? Yes  SHORT TERM GOALS: Target date: 01/31/24  Independent with initial HEP Goal status: met 01/31/24  LONG TERM GOALS: Target date: 04/15/24  Decrease pain 50% Goal status: progressing 03/20/24 and 03/26/24 2.  Dress without difficulty Goal status: progressing 03/20/24 and 03/26/24 3.  Do hair without difficulty Goal status: progressing 03/14/24 and 03/26/24 4.  Increase AROM right shoulder flexion to 130 degrees Goal status:  5.  Increase right shoulder ER to 60 degrees Goal status: progressing 03/14/24  6.  Return to water  aerobics and or gym activity Goal status: on going 02/26/24  and 03/26/24  PLAN:  PT FREQUENCY: 1-2x/week  PT DURATION: 12 weeks  PLANNED INTERVENTIONS: Therapeutic exercises, Therapeutic activity, Neuromuscular re-education, Balance training, Gait training, Patient/Family education, Self Care, Joint mobilization, Dry Needling, Electrical stimulation, Cryotherapy, Vasopneumatic device, and Manual therapy  PLAN FOR NEXT SESSION: Try to regain some of  her ROM and function, treat pain as needed, address the left shoulder as well  03/26/2024, 2:31 PM [facility] [address] [address] [place], [place], [address] Phone: [phone]   Fax:  [phone]

## 2024-04-02 ENCOUNTER — Ambulatory Visit: Admitting: Physical Therapy

## 2024-04-03 ENCOUNTER — Ambulatory Visit: Admitting: Physical Therapy

## 2024-04-03 ENCOUNTER — Ambulatory Visit: Attending: Orthopaedic Surgery | Admitting: Occupational Therapy

## 2024-04-03 ENCOUNTER — Encounter: Payer: Self-pay | Admitting: Physical Therapy

## 2024-04-03 DIAGNOSIS — M25511 Pain in right shoulder: Secondary | ICD-10-CM | POA: Diagnosis present

## 2024-04-03 DIAGNOSIS — M25611 Stiffness of right shoulder, not elsewhere classified: Secondary | ICD-10-CM

## 2024-04-03 DIAGNOSIS — M25512 Pain in left shoulder: Secondary | ICD-10-CM | POA: Insufficient documentation

## 2024-04-03 DIAGNOSIS — G8929 Other chronic pain: Secondary | ICD-10-CM

## 2024-04-03 DIAGNOSIS — M25641 Stiffness of right hand, not elsewhere classified: Secondary | ICD-10-CM | POA: Insufficient documentation

## 2024-04-03 DIAGNOSIS — R252 Cramp and spasm: Secondary | ICD-10-CM | POA: Diagnosis present

## 2024-04-03 DIAGNOSIS — M25642 Stiffness of left hand, not elsewhere classified: Secondary | ICD-10-CM | POA: Diagnosis present

## 2024-04-03 DIAGNOSIS — M542 Cervicalgia: Secondary | ICD-10-CM

## 2024-04-03 DIAGNOSIS — M6281 Muscle weakness (generalized): Secondary | ICD-10-CM

## 2024-04-03 DIAGNOSIS — M79642 Pain in left hand: Secondary | ICD-10-CM | POA: Insufficient documentation

## 2024-04-03 DIAGNOSIS — R278 Other lack of coordination: Secondary | ICD-10-CM | POA: Diagnosis present

## 2024-04-03 DIAGNOSIS — M79641 Pain in right hand: Secondary | ICD-10-CM | POA: Diagnosis present

## 2024-04-03 NOTE — Therapy (Signed)
 OUTPATIENT PHYSICAL THERAPY SHOULDER    Patient Name: Veronica Galvan MRN: [id number] DOB:[birth year], 68 y.o., female Today's Date: 04/03/2024  END OF SESSION:  PT End of Session - 04/03/24 0756     Visit Number 19    Date for Recertification  04/15/24    Authorization Type [employer] [place]    PT Start Time [phone]    PT Stop Time 0855    PT Time Calculation (min) 60 min    Activity Tolerance Patient tolerated treatment well    Behavior During Therapy [facility] for tasks assessed/performed           Past Medical History:  Diagnosis Date   Allergy     Anxiety    Arthritis    hands   Cataract    Diabetes mellitus without complication (HCC)    type 2   Family history of adverse reaction to anesthesia    son has malignant hyperthermia, daughter does not daughter recently had c section without problems   GERD (gastroesophageal reflux disease)    Headache    sinus   Hyperlipidemia    Hypertension    PONV (postoperative nausea and vomiting)    nausea only   Ulcer, stomach peptic yrs ago   Past Surgical History:  Procedure Laterality Date   APPENDECTOMY     both hells bone spur repair     both heels with metal clips   both shoulder rotator cuff repair     CESAREAN SECTION     x 1   COLONOSCOPY WITH PROPOFOL  N/A 09/07/2016   Procedure: COLONOSCOPY WITH PROPOFOL ;  Surgeon: Vicci Gladis POUR, MD;  Location: [facility];  Service: Endoscopy;  Laterality: N/A;   colonscopy  06/2011   polyps   ELBOW FRACTURE SURGERY Left    EYE SURGERY     FRACTURE SURGERY     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/10/2022   Procedure: REVERSE SHOULDER ARTHROPLASTY;  Surgeon: Melita Drivers, MD;  Location: [facility];  Service: Orthopedics;  Laterality: Right;  Please follow in room 6 if able   TUBAL LIGATION     VESICO-VAGINAL FISTULA REPAIR     Patient Active Problem List   Diagnosis Date Noted   Aortic atherosclerosis 02/07/2024   Chronic right shoulder pain 02/07/2024   Gastroesophageal  reflux disease 11/16/2021   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 08/18/2021   Dermatofibroma 08/18/2021   History of malignant neoplasm of skin 08/18/2021   Lentigo 08/18/2021   Melanocytic nevi of trunk 08/18/2021   Nevus lipomatosus cutaneous superficialis 08/18/2021   Rosacea 08/18/2021   Actinic keratosis 08/18/2021   Sensorineural hearing loss (SNHL) of left ear with unrestricted hearing of right ear 07/26/2021   Tinnitus of left ear 07/26/2021   Acute recurrent maxillary sinusitis 06/22/2021   Primary hypertension 01/12/2021   Hyperlipidemia associated with type 2 diabetes mellitus (HCC) 01/12/2021   Arthritis 01/12/2021   Type II diabetes mellitus (HCC) 11/25/2019   Ingrown toenail 09/05/2018   Neck pain 01/29/2018   Tick bite 10/24/2017    PCP: [name], [name]  REFERRING PROVIDER:[name], MD  REFERRING DIAG: s/p right reverse TSA with complications  THERAPY DIAG:  Muscle weakness (generalized)  Stiffness of right shoulder, not elsewhere classified  Chronic right shoulder pain  Cervicalgia  Spasm  Rationale for Evaluation and Treatment: Rehabilitation  ONSET DATE: 01/01/24  SUBJECTIVE:  SUBJECTIVE STATEMENT:  I feel terrible, I am having sinus issues and did not sleep.   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 We saw her in PT for many visits.  She had good progress but during a very busy time from Thanksgiving to [place] Years she started having pain and some regression of motion.  She has had x-rays, bone scan and a CT.  She saw [name] after the CT.  Feels like it could be an infection, PT vs a  revision.   11/30/23 Patient reports that she saw the surgeon she is frustrated and upset as she feels that she still has no answers, it is felt that there is no loosening and no infection.  She is still hurting with basic ADL's, her AROM decreased beginning in March, as she started having increased pain in mid January after a very busy and active few month, she has an order for the left shoulder and the okay for us  to see her for her right shoulder for another month PAIN:  Are you having pain? Yes: NPRS scale: 7/10 Pain location: right shoulder, HA, left shoulder pain up to 8/10 with motions Pain description: dull ache at rest, sharp with motions Aggravating factors: quick motions, movements pain up to 10/10 Relieving factors: ice, rest, Tylenol  at best 3/10  PRECAUTIONS: none  RED FLAGS: None   WEIGHT BEARING RESTRICTIONS: No  FALLS:  Has patient fallen in last 6 months? no  LIVING ENVIRONMENT: Lives with: lives alone Lives in: House/apartment Stairs: No Has following equipment at home: None  OCCUPATION: retired  PLOF: Independent, very active with grandkids, does 5 classes a week [employer]  PATIENT GOALS:dress without difficulty, do hair, have good ROM and less pain  NEXT MD VISIT: Mid November  OBJECTIVE:   DIAGNOSTIC FINDINGS:  See above  PATIENT SURVEYS:    COGNITION: Overall cognitive status: Within functional limits for tasks assessed     SENSATION: WFL  POSTURE: Fwd head, rounded shoulders, elevated and guarded shoulder  UPPER EXTREMITY ROM:   Active ROM Right AROM  eval Left AROM Eval Right AROM 02/22/24 Right  AROM 03/20/24                  AROM 11/30/23  Shoulder flexion 105 125 112 118                  R 120  Shoulder extension                        Shoulder abduction 85 100 90 93                  Right 100  Shoulder adduction                        Shoulder internal rotation 0 30  10                  Right 28  Shoulder external  rotation 46 65  j                    Right 60  Elbow flexion                        Elbow extension                        Wrist flexion                        Wrist extension                        Wrist ulnar deviation                        Wrist radial deviation                        Wrist pronation                        Wrist supination                        (Blank rows = not tested)  UPPER EXTREMITY MMT:  In the available ROM  MMT Right eval Left eval  Shoulder flexion 4- 4-  Shoulder extension    Shoulder abduction    Shoulder adduction    Shoulder internal rotation 4+ 4+  Shoulder external rotation 4-P! 4  Middle trapezius    Lower trapezius    Elbow flexion    Elbow extension    Wrist flexion    Wrist extension    Wrist ulnar deviation    Wrist radial deviation    Wrist pronation    Wrist supination    Grip strength (lbs)    (Blank rows = not tested)  PALPATION:  Very tight and tender in the pectoral, upper trap, the entire right upper arm, slight warmth in the right anterior lateral arm  Positive impingement on the right   TODAY'S TREATMENT:                                                                                                                                          DATE:  04/03/24 Nustep level 4 x 5 minutes Passive stretch shoulders bilaterally STM to the neck, upper traps and rhomboids Vaso 34 degrees right shoulder  03/26/24 UBE L 2 each way Passive stretch bilateral UE's STM to the upper traps, neck and rhomboids, some MFR to knots in the upper traps Vaso 34 degrees  03/20/24 UBE level 5 x 6 minutes Passive stretch bilateral UE's, passive stretch LE's STM to the upper traps, neck and rhomboids, some MFR to knots in the upper  traps Vaso 34 degrees  03/19/24 UBE level 4 x 6 minutes 20# row 20# lats 5# straight arm pulls 5# chest press 3# serratus and isometric circles 2# wate bar OHP 2# wate bar extensions with PT assist Passive stretch LE's and the right shoulder all motions Vaso low pressure 34 degrees right shoulder  03/14/24 UBE level 4 x 6 minutes Rows 20# Lats 20# 2# wall slides and circles Passive stretch bilateral shoulders 3# wate bar chest press 3# wate bar flexion 5# small chest press, serratus press and isometric circles PNF supine bilateral shoulders Vaso right shoulder 34 degrees  03/12/24 Rows 20# 2x10 Lats 20# 2x10 Chest press 5# 2x10 UBE level 4 x 6 minutes 3# serratus 3# isometric circles 2# ER/IR Passive strength right shoulder all motions, joint distraction Vaso 34 degrees low pressure right shoulder  03/07/24 Nustep level 4 x 7 minutes 5# straight arm pulls Ball vs wall 4 positions 15# rows 15# lats Supine 3# isometric circles bilaterally 3# serratus bilaterally Passive stretches bilaterally UE's Vaso medium pressure 34 degrees  03/05/24 Nustep level 5 x 6 minutes 10# straight arm pulls and then 5# 2# slides 2# circles 2# extension Shrugs and upper trap and levator stretches Passive stretch to the right shoulder and the LE's  02/27/24 Nustep level 5 x 6 minutes 20# rows 20# lats 5# chest press 10# biceps 25#  triceps 3# extension with wate bar behind the back  3# wate bar OHP to finger ladder LE stretches Passive ROM right shoulder all motions to tolerance   PATIENT EDUCATION: Education details: poc Person educated: Patient Education method: Programmer, Multimedia, Facilities Manager, Actor cues, Verbal cues, and Handouts Education comprehension: verbalized understanding  HOME EXERCISE PROGRAM:  ASSESSMENT:  CLINICAL IMPRESSION:  Patient reports that she really took it easy and tried to spread her tasks out over time for hte cooking holiday, she is not feeling well today, has some significant spasms and tightness, I did both shoulders ROM   Patient reports that she is doing okay, she reports that she is trying to back off of some activities and break things down over time to ease the stress and strain thinks this is helping as she is making soup for the community and is doing this over a few days instead of all at one time..She has a lot of knots in the upper traps, neck and rhomboids, her ROM is improving.  She is having left shoulder pain as well due to it compensating so much, she has worries about this and needing the left shoulder to be strong so when she gets the right TSA she can have a good shoulder  Evaluation:  Patient is very frustrated, she had the right reverse TSA last July.  She did very well until around Christmas, then she really started having increased pain and lost some ROM.  She has had blood work done, US , bone scan and CT performed.  There has been some talk of a possible infection, MD feels like she may benefit from a revision.  She would like to try PT again and the MD agreed.  She has lost ROM since I last saw her, she also is having some significant left shoulder pain from over using it.   July 2025: Patient saw surgeon, the bone scan said did not feel that there was infection or loosening however the surgeon is not so sure, he wants to do an US  to be sure, that is scheduled for the  next few weeks, since she  has not been to PT in a month I re-measured her ROM, she has lost at least 15 degrees of motion and more for all motions.  She is also having more pain in the left shoulder possibly from overdoing it, because she is trying to not use the right shoulder.    OBJECTIVE IMPAIRMENTS: cardiopulmonary status limiting activity, decreased activity tolerance, decreased endurance, decreased ROM, decreased strength, increased edema, increased muscle spasms, impaired flexibility, impaired UE functional use, improper body mechanics, postural dysfunction, and pain.  REHAB POTENTIAL: Good  CLINICAL DECISION MAKING: Evolving/moderate complexity  EVALUATION COMPLEXITY: Low   GOALS: Goals reviewed with patient? Yes  SHORT TERM GOALS: Target date: 01/31/24  Independent with initial HEP Goal status: met 01/31/24  LONG TERM GOALS: Target date: 04/15/24  Decrease pain 50% Goal status: progressing 03/20/24 and 03/26/24 2.  Dress without difficulty Goal status: progressing 03/20/24 and 03/26/24 3.  Do hair without difficulty Goal status: progressing 03/14/24 and 03/26/24 4.  Increase AROM right shoulder flexion to 130 degrees Goal status:  5.  Increase right shoulder ER to 60 degrees Goal status: progressing 03/14/24  6.  Return to water  aerobics and or gym activity Goal status: on going 02/26/24  and 03/26/24  PLAN:  PT FREQUENCY: 1-2x/week  PT DURATION: 12 weeks  PLANNED INTERVENTIONS: Therapeutic exercises, Therapeutic activity, Neuromuscular re-education, Balance training, Gait training, Patient/Family education, Self Care, Joint mobilization, Dry Needling, Electrical stimulation, Cryotherapy, Vasopneumatic device, and Manual therapy  PLAN FOR NEXT SESSION: Try to regain some of her ROM and function, treat pain as needed, address the left shoulder as well  04/03/2024, 7:57 AM  [facility]. [place], [place], [address] Phone: [phone]   Fax:  [phone]

## 2024-04-03 NOTE — Therapy (Signed)
 OUTPATIENT OCCUPATIONAL THERAPY ORTHO  treatment  Patient Name: Veronica Galvan MRN: [id number] DOB:June 14, 1955, 68 y.o., female Today's Date: 04/03/2024  PCP: Almarie Birmingham, NP REFERRING PROVIDER: Almarie Birmingham, NP  END OF SESSION:  OT End of Session - 04/03/24 1645     Visit Number 6    Number of Visits 12    Date for Recertification  05/15/24    Authorization Type [employer] MCR    Authorization Time Period 12 weeks    Authorization - Visit Number 6    Progress Note Due on Visit 10    OT Start Time 1109    OT Stop Time 1149    OT Time Calculation (min) 40 min    Behavior During Therapy WFL for tasks assessed/performed             Past Medical History:  Diagnosis Date   Allergy     Anxiety    Arthritis    hands   Cataract    Diabetes mellitus without complication (HCC)    type 2   Family history of adverse reaction to anesthesia    son has malignant hyperthermia, daughter does not daughter recently had c section without problems   GERD (gastroesophageal reflux disease)    Headache    sinus   Hyperlipidemia    Hypertension    PONV (postoperative nausea and vomiting)    nausea only   Ulcer, stomach peptic yrs ago   Past Surgical History:  Procedure Laterality Date   APPENDECTOMY     both hells bone spur repair     both heels with metal clips   both shoulder rotator cuff repair     CESAREAN SECTION     x 1   COLONOSCOPY WITH PROPOFOL  N/A 09/07/2016   Procedure: COLONOSCOPY WITH PROPOFOL ;  Surgeon: Vicci Gladis POUR, MD;  Location: [facility];  Service: Endoscopy;  Laterality: N/A;   colonscopy  06/2011   polyps   ELBOW FRACTURE SURGERY Left    EYE SURGERY     FRACTURE SURGERY     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/10/2022   Procedure: REVERSE SHOULDER ARTHROPLASTY;  Surgeon: Melita Drivers, MD;  Location: [facility];  Service: Orthopedics;  Laterality: Right;  Please follow in room 6 if able   TUBAL LIGATION     VESICO-VAGINAL FISTULA REPAIR      Patient Active Problem List   Diagnosis Date Noted   Aortic atherosclerosis 02/07/2024   Chronic right shoulder pain 02/07/2024   Gastroesophageal reflux disease 11/16/2021   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 08/18/2021   Dermatofibroma 08/18/2021   History of malignant neoplasm of skin 08/18/2021   Lentigo 08/18/2021   Melanocytic nevi of trunk 08/18/2021   Nevus lipomatosus cutaneous superficialis 08/18/2021   Rosacea 08/18/2021   Actinic keratosis 08/18/2021   Sensorineural hearing loss (SNHL) of left ear with unrestricted hearing of right ear 07/26/2021   Tinnitus of left ear 07/26/2021   Acute recurrent maxillary sinusitis 06/22/2021   Primary hypertension 01/12/2021   Hyperlipidemia associated with type 2 diabetes mellitus (HCC) 01/12/2021   Arthritis 01/12/2021   Type II diabetes mellitus (HCC) 11/25/2019   Ingrown toenail 09/05/2018   Neck pain 01/29/2018   Tick bite 10/24/2017    ONSET DATE: 02/07/24  REFERRING DIAG: M19.90 (ICD-10-CM) - Arthritis      THERAPY DIAG:  No diagnosis found.  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 Pt was previously seen for OT and d/c 12/06/23  PRECAUTIONS: hx of R reverse toal shoulder, none for hands    WEIGHT BEARING RESTRICTIONS: No  PAIN:  Are you having pain? Yes: NPRS scale: R hand 6/10, L hand 5/10 Pain location: bilateral hands Pain description: aching Aggravating factors: overuse, waking up in pain Relieving factors: paraffin Pt has R shoulder pain 6-8/10, PT is addressing, OT will not address., FALLS: Has patient fallen in last 6 months? No  LIVING ENVIRONMENT: Lives with: lives alone Lives in:  House/apartment   PLOF: Independent  PATIENT GOALS: decrease pain in hands, improve function/ mobility  NEXT MD VISIT: unknown  OBJECTIVE:  Note: Objective measures were completed at Evaluation unless otherwise noted.  HAND DOMINANCE: Right  ADLs:Pt reports increased overall difficulty with ADLs due to pain and stiffness. Pt is mod I with basic ADLs. She has difficulty with buttons, styling hair, opening jars.    FUNCTIONAL OUTCOME MEASURES: Upper Extremity Functional Scale (UEFS): 33/80  UPPER EXTREMITY ROM:   RUE composite finger flexion 70% (3cm from palm at middle finger)     LUE composite finer flexion 60% (3.5cm from palm at middle finger) bilateral UE's grossly 90-95% composite finger extension     HAND FUNCTION: Grip strength: Right: 18 lbs; Left: 15 lbs, Lateral pinch: Right: 10 lbs, Left: 12 lbs, and Tip pinch: Right 4 lbs, Left: 2 lbs  COORDINATION:NT   SENSATION: WFL  EDEMA: Pt with bony defomities at PIP joints of all digits which pt reports are bone spurs, Pt also has bony deformity at DIP for right thumb, index and 5th digit and LUE small and index fingers   COGNITION: Overall cognitive status: Within functional limits for tasks assessed   OBSERVATIONS: Pleasant female well known to therapist from previous occupational therapy at this site   TREATMENT DATE:12/3/25araffin to right hand for pain management x 8 mins, no adverse reactions while therapist performed US  to left hand and digits 3.3 mhz, 0.8 w/cm 2, 20% x 8 mins no adverse reactions. Paraffin to left hand for pain management x 8 mins, no adverse reactions while therapist performed US  to right hand and digits 3.3 mhz, 0.8 w/cm 2, 20% x 8 mins no adverse reactions. Soft tissue/ joint mobs to hands and wrists as well as P/ROM comosite finger flexion to digits.  11/18/25Paraffin to right hand for pain management x 8 mins, no adverse reactions while therapist performed US  to left hand and digits 3.3  mhz, 0.8 w/cm 2, 20% x 8 mins no adverse reactions. Paraffin to left hand for pain management x 8 mins, no adverse reactions while therapist performed US  to right hand and digits 3.3 mhz, 0.8 w/cm 2, 20% x 8 mins no adverse reactions. Pt reports seeking assistance for cooking soup for church and receiving a padddle at therapist recommendation for joint protection when stirring. Therpist recommends pt coniders delgating the soup task to others in the future or that she seeks additional assist. araffin to right hand for pain management x 8 mins, no adverse reactions while therapist performed US  to left hand and digits 3.3 mhz, 0.8 w/cm 2, 20% x 8 mins no adverse reactions. Paraffin to left hand for pain management x 8 mins, no adverse reactions while therapist performed US  to right hand and digits 3.3 mhz, 0.8 w/cm 2, 20% x 8  mins no adverse reactions. with lotion. P/ROM to digits in flexion individually and compositely. A/ROM finger thumb opposition as able to bilateral hands  11/12/24Paraffin to right hand for pain management x 8 mins, no adverse reactions while therapist performed US  to left hand and digits 3.3 mhz, 0.8 w/cm 2, 20% x 8 mins no adverse reactions. Paraffin to left hand for pain management x 8 mins, no adverse reactions while therapist performed US  to right hand and digits 3.3 mhz, 0.8 w/cm 2, 20% x 8 mins no adverse reactions. Pt reports making changes to her baking schedule and ordering a padddle at therapist recommendation for joint protection. Soft tissue and joint mobs to bilateral hands, wrist and forearms. P/ROM to digits in flexion individually and compositely. Pt demonstrates improved flexibility end of session.       03/05/24 Paraffin to right hand for pain management x 8 mins, no adverse reactions while therapist performed US  to left hand and digits 3.3 mhz, 0.8 w/cm 2, 20% x 8 mins no adverse reactions. Paraffin to left hand for pain management x 8 mins, no adverse reactions  while therapist performed US  to right hand and digits 3.3 mhz, 0.8 w/cm 2, 20% x 8 mins no adverse reactions.P/ROM to digits in flexion individually and compositely.discussed options for self care with pt's upcoming cooking activities: ie use of paddle, and suggestions for when pt has shoulder sugery.   Soft tissue and joint mobs to bilateral hands, wrist and forearms. Grip strengthening with stress tree for resisted grip  02/26/24 Paraffin to right hand for pain management x 8 mins, no adverse reactions while therapist perfromed US  to left hand and digits 3.3 mhz, 0.8 w/cm 2, 20% x 8 mins no adverse reactions. Paraffin to left hand for pain management x 8 mins, no adverse reactions while therapist performed US  to right hand and digits 3.3 mhz, 0.8 w/cm 2, 20% x 8 mins no adverse reactions.P/ROM to digits in flexion  individually and compositely.  Soft tissue and joint mobs to bilateral hands, wrist and forearms.  Discussed improtance of balancing activity and rest in order to management pain and prevent injury.  02/21/24- eval Paraffin to bilateral hands and wrists for pain management x 8 mins, no adverse reactions. P/ROM to digits in flexion  individually and compositely soft tissue mobs to bilateral hands. Pt reports decreased pain end of session.      PATIENT EDUCATION: Education details: see above Person educated: Patient Education method: Explanation Education comprehension: verbalized understanding  HOME EXERCISE PROGRAM: n/a  GOALS: Goals reviewed with patient? Yes  SHORT TERM GOALS: Target date: 03/23/24  I with inital HEP.  Goal status:  ongoing 03/13/24  2.    Pt will improve RUE tip pinch by 2 lbs(from baseline) for increased ease with daily activities.                                baseline: RUE tip 4lbs, LUE tip 2 lbs                          Goal status: ongoing 03/13/24  3.  Pt will improve LUE tip pinch by 2 lbs(from baseline) for increased ease with daily  activities.  baseline: RUE tip 4lbs, LUE tip 2 lbs Goal status:  ongoing 03/13/24  4.  I with adapted strategies/ adapted equipment to minimize pain and to increase pt I with ADLs/IADLs  Goal status: ongoing, pt has ordered a paddle to use for stirring soup, 03/19/24  5.  Pt will report bilateral hand pain no greater than 4/10 for ADLs/IADLS Baseline: RUE 7/10, LUE 6/10 Goal status: ongoing 03/13/24- pain continues to improve 4/10 today 03/19/24, will continue to monitor with activity      LONG TERM GOALS: Target date: 05/15/24      Pt will improve RUE grip strength to 22 # or greater for increased functional use Baseline: RUE 18# Goal status: INITIAL  2.  Pt will improve LUE grip strength to 25 # or greater for increased functional use Baseline: LUE 21# Goal status: INITIAL   3.Pt will demonstrate improved composite finger flexion for ADLs as evidenced by pt. bringing middle fingertip for RUE within 2.75 cm of palm.             Baseline: 3 cm from palm to middle fingertip             Goal status inital    4. .Pt will demonstrate improved composite finger flexion for ADLs as evidenced by pt. bringing middle fingertip for LUE within 3.25 of palm.             Baseline: 3.5 cm from palm to middle fingertip             Goal status: inital     5. check 9 hole peg test and set goals prn- ASSESSMENT:  CLINICAL IMPRESSION: Patient is progressing towards goals.Pt demonstrates improved flexibility at end of session. She reports spreading her workload for Thanksgiving over several days to minimize pain and fatigue.PERFORMANCE DEFICITS: in functional skills including ADLs, IADLs, coordination, dexterity, proprioception, sensation, edema, ROM, strength, pain, flexibility, Fine motor control, Gross motor control, endurance, decreased knowledge of precautions, decreased knowledge of use of DME, and UE functional use,and psychosocial skills including coping strategies, environmental  adaptation, habits, interpersonal interactions, and routines and behaviors.   IMPAIRMENTS: are limiting patient from ADLs, IADLs, rest and sleep, play, leisure, and social participation.   COMORBIDITIES: may have co-morbidities  that affects occupational performance. Patient will benefit from skilled OT to address above impairments and improve overall function.  MODIFICATION OR ASSISTANCE TO COMPLETE EVALUATION: No modification of tasks or assist necessary to complete an evaluation.  OT OCCUPATIONAL PROFILE AND HISTORY: Detailed assessment: Review of records and additional review of physical, cognitive, psychosocial history related to current functional performance.  CLINICAL DECISION MAKING: LOW - limited treatment options, no task modification necessary  REHAB POTENTIAL: Good  EVALUATION COMPLEXITY: Low      PLAN:  OT FREQUENCY: 1-2x/week  OT DURATION: 12 weeks  PLANNED INTERVENTIONS: 97168 OT Re-evaluation, 97535 self care/ADL training, 02889 therapeutic exercise, 97530 therapeutic activity, 97112 neuromuscular re-education, 97140 manual therapy, 97035 ultrasound, 97018 paraffin, 02989 moist heat, 97010 cryotherapy, 97014 electrical stimulation unattended, 97760 Orthotic Initial, 97763 Orthotic/Prosthetic subsequent, scar mobilization, passive range of motion, functional mobility training, energy conservation, coping strategies training, patient/family education, and DME and/or AE instructions  RECOMMENDED OTHER SERVICES: n/a  CONSULTED AND AGREED WITH PLAN OF CARE: Patient  PLAN FOR NEXT SESSION: check shor term goals,  A/ROM HEP, paraffin, US    Edmar Blankenburg, OT 04/03/2024, 4:46 PM

## 2024-04-04 ENCOUNTER — Encounter: Payer: Self-pay | Admitting: Physical Therapy

## 2024-04-04 ENCOUNTER — Ambulatory Visit: Admitting: Physical Therapy

## 2024-04-04 DIAGNOSIS — M6281 Muscle weakness (generalized): Secondary | ICD-10-CM

## 2024-04-04 DIAGNOSIS — M25611 Stiffness of right shoulder, not elsewhere classified: Secondary | ICD-10-CM

## 2024-04-04 DIAGNOSIS — R252 Cramp and spasm: Secondary | ICD-10-CM

## 2024-04-04 DIAGNOSIS — M542 Cervicalgia: Secondary | ICD-10-CM

## 2024-04-04 DIAGNOSIS — G8929 Other chronic pain: Secondary | ICD-10-CM

## 2024-04-04 DIAGNOSIS — M25512 Pain in left shoulder: Secondary | ICD-10-CM

## 2024-04-04 NOTE — Therapy (Signed)
 OUTPATIENT PHYSICAL THERAPY SHOULDER    Patient Name: Veronica Galvan MRN: [id number] DOB:December 10, 1955, 68 y.o., female Today's Date: 04/04/2024  END OF SESSION:  PT End of Session - 04/04/24 1841     Visit Number 20    Date for Recertification  04/15/24    Authorization Type [employer] [place]    PT Start Time [phone]    PT Stop Time 0645    PT Time Calculation (min) 62 min    Activity Tolerance Patient tolerated treatment well    Behavior During Therapy WFL for tasks assessed/performed           Past Medical History:  Diagnosis Date   Allergy     Anxiety    Arthritis    hands   Cataract    Diabetes mellitus without complication (HCC)    type 2   Family history of adverse reaction to anesthesia    son has malignant hyperthermia, daughter does not daughter recently had c section without problems   GERD (gastroesophageal reflux disease)    Headache    sinus   Hyperlipidemia    Hypertension    PONV (postoperative nausea and vomiting)    nausea only   Ulcer, stomach peptic yrs ago   Past Surgical History:  Procedure Laterality Date   APPENDECTOMY     both hells bone spur repair     both heels with metal clips   both shoulder rotator cuff repair     CESAREAN SECTION     x 1   COLONOSCOPY WITH PROPOFOL  N/A 09/07/2016   Procedure: COLONOSCOPY WITH PROPOFOL ;  Surgeon: Vicci Gladis POUR, MD;  Location: [facility];  Service: Endoscopy;  Laterality: N/A;   colonscopy  06/2011   polyps   ELBOW FRACTURE SURGERY Left    EYE SURGERY     FRACTURE SURGERY     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/10/2022   Procedure: REVERSE SHOULDER ARTHROPLASTY;  Surgeon: Melita Drivers, MD;  Location: [facility];  Service: Orthopedics;  Laterality: Right;  Please follow in room 6 if able   TUBAL LIGATION     VESICO-VAGINAL FISTULA REPAIR     Patient Active Problem List   Diagnosis Date Noted   Aortic atherosclerosis 02/07/2024   Chronic right shoulder pain 02/07/2024   Gastroesophageal  reflux disease 11/16/2021   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 08/18/2021   Dermatofibroma 08/18/2021   History of malignant neoplasm of skin 08/18/2021   Lentigo 08/18/2021   Melanocytic nevi of trunk 08/18/2021   Nevus lipomatosus cutaneous superficialis 08/18/2021   Rosacea 08/18/2021   Actinic keratosis 08/18/2021   Sensorineural hearing loss (SNHL) of left ear with unrestricted hearing of right ear 07/26/2021   Tinnitus of left ear 07/26/2021   Acute recurrent maxillary sinusitis 06/22/2021   Primary hypertension 01/12/2021   Hyperlipidemia associated with type 2 diabetes mellitus (HCC) 01/12/2021   Arthritis 01/12/2021   Type II diabetes mellitus (HCC) 11/25/2019   Ingrown toenail 09/05/2018   Neck pain 01/29/2018   Tick bite 10/24/2017    PCP: [name], [name]  REFERRING PROVIDER:[name], MD  REFERRING DIAG: s/p right reverse TSA with complications  THERAPY DIAG:  Muscle weakness (generalized)  Stiffness of right shoulder, not elsewhere classified  Chronic right shoulder pain  Cervicalgia  Spasm  Acute pain of left shoulder  Rationale for Evaluation and Treatment: Rehabilitation  ONSET DATE: 01/01/24  SUBJECTIVE:  SUBJECTIVE STATEMENT:  Patient continues to not feel well, she reports tight all over with pain   Evaluation:  Patient had a fall on her deck, tripped on a board that was sticking up and had a severe fracture of the right proximal humerus, 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 We saw her in PT for many visits.  She had good progress but during a very busy time from Thanksgiving to [place] Years she started having pain and some regression of motion.  She has had x-rays, bone scan and a CT.  She saw [name] after the CT.  Feels like  it could be an infection, PT vs a revision.   11/30/23 Patient reports that she saw the surgeon she is frustrated and upset as she feels that she still has no answers, it is felt that there is no loosening and no infection.  She is still hurting with basic ADL's, her AROM decreased beginning in March, as she started having increased pain in mid January after a very busy and active few month, she has an order for the left shoulder and the okay for us  to see her for her right shoulder for another month PAIN:  Are you having pain? Yes: NPRS scale: 7/10 Pain location: right shoulder, HA, left shoulder pain up to 8/10 with motions Pain description: dull ache at rest, sharp with motions Aggravating factors: quick motions, movements pain up to 10/10 Relieving factors: ice, rest, Tylenol  at best 3/10  PRECAUTIONS: none  RED FLAGS: None   WEIGHT BEARING RESTRICTIONS: No  FALLS:  Has patient fallen in last 6 months? no  LIVING ENVIRONMENT: Lives with: lives alone Lives in: House/apartment Stairs: No Has following equipment at home: None  OCCUPATION: retired  PLOF: Independent, very active with grandkids, does 5 [facility] a week [employer]  PATIENT GOALS:dress without difficulty, do hair, have good ROM and less pain  NEXT MD VISIT: Mid November  OBJECTIVE:   DIAGNOSTIC FINDINGS:  See above  PATIENT SURVEYS:    COGNITION: Overall cognitive status: Within functional limits for tasks assessed     SENSATION: WFL  POSTURE: Fwd head, rounded shoulders, elevated and guarded shoulder  UPPER EXTREMITY ROM:   Active ROM Right AROM  eval Left AROM Eval Right AROM 02/22/24 Right  AROM 03/20/24                  AROM 11/30/23  Shoulder flexion 105 125 112 118                  R 120  Shoulder extension                        Shoulder abduction 85 100 90 93                  Right 100  Shoulder adduction                        Shoulder internal rotation 0 30  10                   Right 28  Shoulder external rotation 46 65  j                    Right 60  Elbow flexion                        Elbow extension                        Wrist flexion                        Wrist extension                        Wrist ulnar deviation                        Wrist radial deviation                        Wrist pronation                        Wrist supination                        (Blank rows = not tested)  UPPER EXTREMITY MMT:  In the available ROM  MMT Right eval Left eval  Shoulder flexion 4- 4-  Shoulder extension    Shoulder abduction    Shoulder adduction    Shoulder internal rotation 4+ 4+  Shoulder external rotation 4-P! 4  Middle trapezius    Lower trapezius    Elbow flexion    Elbow extension    Wrist flexion    Wrist extension    Wrist ulnar deviation    Wrist radial deviation    Wrist pronation    Wrist supination    Grip strength (lbs)    (Blank rows = not tested)  PALPATION:  Very tight and tender in the pectoral, upper trap, the entire right upper arm, slight warmth in the right anterior lateral arm  Positive impingement on the right   TODAY'S TREATMENT:                                                                                                                                          DATE:  04/04/24 Passive stretch of the LE's  Passive stretch of the UE's STM to the LE's STM to the upper traps and rhomboids Vaso to the left shoulder  04/03/24 Nustep level 4 x 5 minutes Passive stretch shoulders bilaterally STM to the neck, upper traps and rhomboids Vaso 34 degrees right shoulder  03/26/24 UBE L 2 each way Passive stretch bilateral UE's STM to the upper traps, neck and rhomboids, some MFR to knots in the upper traps Vaso 34 degrees  03/20/24 UBE level 5 x 6 minutes Passive stretch bilateral UE's, passive stretch LE's STM to the upper traps, neck and rhomboids, some MFR to knots in the upper traps Vaso 34 degrees  03/19/24 UBE level 4 x 6 minutes 20# row 20# lats 5# straight arm pulls 5# chest press 3# serratus and isometric circles 2# wate bar OHP 2# wate bar extensions with PT assist Passive stretch LE's and the right shoulder all motions Vaso low pressure 34 degrees right shoulder  03/14/24 UBE level 4 x 6 minutes Rows 20# Lats 20# 2# wall slides and circles Passive stretch bilateral shoulders 3# wate bar chest press 3# wate bar flexion 5# small chest press, serratus press and isometric circles PNF supine bilateral shoulders Vaso right shoulder 34 degrees  03/12/24 Rows 20# 2x10 Lats 20# 2x10 Chest press 5# 2x10 UBE level 4 x 6 minutes 3# serratus 3# isometric circles 2# ER/IR Passive strength right shoulder all motions, joint distraction Vaso 34 degrees low pressure right shoulder  03/07/24 Nustep level 4 x 7 minutes 5# straight arm pulls Ball vs wall 4 positions 15# rows 15# lats Supine 3# isometric circles bilaterally 3# serratus bilaterally Passive stretches bilaterally UE's Vaso medium pressure 34 degrees  03/05/24 Nustep level 5 x 6 minutes 10# straight arm pulls and then 5# 2# slides 2# circles 2# extension Shrugs and  upper trap and levator stretches Passive stretch to the right shoulder and the LE's  02/27/24 Nustep level 5 x 6 minutes 20# rows 20# lats 5# chest press 10# biceps 25# triceps 3# extension with wate bar behind the back  3# wate bar OHP to finger ladder LE stretches Passive ROM right shoulder all motions to tolerance   PATIENT EDUCATION: Education details: poc Person educated: Patient Education method: Programmer, Multimedia, Facilities Manager, Actor cues, Verbal cues, and Handouts Education comprehension: verbalized understanding  HOME EXERCISE PROGRAM:  ASSESSMENT:  CLINICAL IMPRESSION:  Patient reports that she really is not feeling well, she says a sinus issue.  She c/o tightness and cramps all over, she also has an increased left shoulder pain which she is very worried about, we have an order to treat this from Dr. Joane.  The worry being she will have the right TSA in January and if the left shoulder is hurting how will she do things because she struggled with a good shoulder after the last TSA   Patient reports that she is doing okay, she reports that she is trying to back off of some activities and break things down over time to ease the stress and strain thinks this is helping as she is making soup for the community and is doing this over a few days instead of all at one time..She has a lot of knots in the upper traps, neck and rhomboids, her ROM is improving.  She is having left shoulder pain as well due to it compensating so much, she has worries about this and needing the left shoulder to be strong so when she gets the right TSA she can have a good shoulder  Evaluation:  Patient is very frustrated, she had the right reverse TSA last July.  She did very well until around Christmas, then she really started having increased pain and lost some ROM.  She has had blood work done, US , bone scan and CT performed.  There has been some talk of a possible infection, MD feels like she may benefit  from a revision.  She would like to try PT again and  the MD agreed.  She has lost ROM since I last saw her, she also is having some significant left shoulder pain from over using it.   July 2025: Patient saw surgeon, the bone scan said did not feel that there was infection or loosening however the surgeon is not so sure, he wants to do an US  to be sure, that is scheduled for the next few weeks, since she has not been to PT in a month I re-measured her ROM, she has lost at least 15 degrees of motion and more for all motions.  She is also having more pain in the left shoulder possibly from overdoing it, because she is trying to not use the right shoulder.    OBJECTIVE IMPAIRMENTS: cardiopulmonary status limiting activity, decreased activity tolerance, decreased endurance, decreased ROM, decreased strength, increased edema, increased muscle spasms, impaired flexibility, impaired UE functional use, improper body mechanics, postural dysfunction, and pain.  REHAB POTENTIAL: Good  CLINICAL DECISION MAKING: Evolving/moderate complexity  EVALUATION COMPLEXITY: Low   GOALS: Goals reviewed with patient? Yes  SHORT TERM GOALS: Target date: 01/31/24  Independent with initial HEP Goal status: met 01/31/24  LONG TERM GOALS: Target date: 04/15/24  Decrease pain 50% Goal status: progressing 03/20/24 and 03/26/24 2.  Dress without difficulty Goal status: progressing 03/20/24 and 03/26/24 3.  Do hair without difficulty Goal status: progressing 03/14/24 and 03/26/24 4.  Increase AROM right shoulder flexion to 130 degrees Goal status:  5.  Increase right shoulder ER to 60 degrees Goal status: progressing 03/14/24  6.  Return to water  aerobics and or gym activity Goal status: on going 02/26/24  and 03/26/24  PLAN:  PT FREQUENCY: 1-2x/week  PT DURATION: 12 weeks  PLANNED INTERVENTIONS: Therapeutic exercises, Therapeutic activity, Neuromuscular re-education, Balance training, Gait training,  Patient/Family education, Self Care, Joint mobilization, Dry Needling, Electrical stimulation, Cryotherapy, Vasopneumatic device, and Manual therapy  PLAN FOR NEXT SESSION: Try to regain some of her ROM and function, treat pain as needed, address the left shoulder as well  04/04/2024, 6:42 PM  [facility] [address] [facility] [place], [place], [address] Phone: [phone]   Fax:  [phone]

## 2024-04-05 ENCOUNTER — Ambulatory Visit: Admitting: Family Medicine

## 2024-04-07 NOTE — Progress Notes (Signed)
 Acute Office Visit  Subjective:  Patient ID: Veronica Galvan, female    DOB: 08-10-1955  Age: 68 y.o. MRN: [id number]  CC: No chief complaint on file.     HPI Veronica Galvan is here for cough and sinus issues.          Past Medical History:  Diagnosis Date   Allergy     Anxiety    Arthritis    hands   Cataract    Diabetes mellitus without complication (HCC)    type 2   Family history of adverse reaction to anesthesia    son has malignant hyperthermia, daughter does not daughter recently had c section without problems   GERD (gastroesophageal reflux disease)    Headache    sinus   Hyperlipidemia    Hypertension    PONV (postoperative nausea and vomiting)    nausea only   Ulcer, stomach peptic yrs ago    Past Surgical History:  Procedure Laterality Date   APPENDECTOMY     both hells bone spur repair     both heels with metal clips   both shoulder rotator cuff repair     CESAREAN SECTION     x 1   COLONOSCOPY WITH PROPOFOL  N/A 09/07/2016   Procedure: COLONOSCOPY WITH PROPOFOL ;  Surgeon: Vicci Gladis POUR, MD;  Location: [facility];  Service: Endoscopy;  Laterality: N/A;   colonscopy  06/2011   polyps   ELBOW FRACTURE SURGERY Left    EYE SURGERY     FRACTURE SURGERY     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/10/2022   Procedure: REVERSE SHOULDER ARTHROPLASTY;  Surgeon: Melita Drivers, MD;  Location: [facility];  Service: Orthopedics;  Laterality: Right;  Please follow in room 6 if able   TUBAL LIGATION     VESICO-VAGINAL FISTULA REPAIR      Family History  Problem Relation Age of Onset   Hypertension Mother    COPD Mother    Cancer Mother    Arthritis Mother    Hypertension Father    Hyperlipidemia Father    Diabetes Father    Cancer Father    Alcohol abuse Father    Diabetes Brother    Alcohol abuse Brother    Hypertension Brother    Arthritis Maternal Grandmother    Birth defects Maternal Grandmother    Diabetes Paternal Grandmother     Heart disease Paternal Grandfather    Diabetes Paternal Aunt    Diabetes Paternal Aunt    Obesity Son     Social History   Socioeconomic History   Marital status: Widowed    Spouse name: Not on file   Number of children: 2   Years of education: Not on file   Highest education level: Bachelor's degree (e.g., BA, AB, BS)  Occupational History   Not on file  Tobacco Use   Smoking status: Former    Current packs/day: 1.00    Average packs/day: 1 pack/day for 14.0 years (14.0 ttl pk-yrs)    Types: Cigarettes   Smokeless tobacco: Never   Tobacco comments:    quit 31 yrs ago  Substance and Sexual Activity   Alcohol use: Yes    Comment: rare   Drug use: No   Sexual activity: Not Currently    Birth control/protection: Post-menopausal  Other Topics Concern   Not on file  Social History Narrative   Right handed   Caffeine intake none   Social Drivers of Corporate Investment Banker Strain:  Low Risk  (11/28/2023)   Overall Financial Resource Strain (CARDIA)    Difficulty of Paying Living Expenses: Not hard at all  Food Insecurity: No Food Insecurity (11/28/2023)   Hunger Vital Sign    Worried About Running Out of Food in the Last Year: Never true    Ran Out of Food in the Last Year: Never true  Transportation Needs: No Transportation Needs (11/28/2023)   PRAPARE - Administrator, Civil Service (Medical): No    Lack of Transportation (Non-Medical): No  Physical Activity: Sufficiently Active (11/28/2023)   Exercise Vital Sign    Days of Exercise per Week: 4 days    Minutes of Exercise per Session: 60 min  Stress: No Stress Concern Present (11/28/2023)   [employer] of Occupational Health - Occupational Stress Questionnaire    Feeling of Stress: Only a little  Social Connections: Moderately Integrated (11/28/2023)   Social Connection and Isolation Panel    Frequency of Communication with Friends and Family: More than three times a week    Frequency of Social  Gatherings with Friends and Family: More than three times a week    Attends Religious Services: More than 4 times per year    Active Member of [employer] or Organizations: Yes    Attends Banker Meetings: More than 4 times per year    Marital Status: Widowed  Intimate Partner Violence: Not At Risk (09/13/2023)   Humiliation, Afraid, Rape, and Kick questionnaire    Fear of Current or Ex-Partner: No    Emotionally Abused: No    Physically Abused: No    Sexually Abused: No    ROS All ROS negative except what is listed in the HPI.   Objective:   Today's Vitals: There were no vitals taken for this visit.  Physical Exam  Assessment & Plan:   Problem List Items Addressed This Visit   None     Follow-up: No follow-ups on file.   [name] FURY Almarie, DNP, [name]-C  I,Emily Lagle,acting as a neurosurgeon for [name] KATHEE Almarie, NP.,have documented all relevant documentation on the behalf of [name] KATHEE Almarie, NP.   I, [name] KATHEE Almarie, NP, have reviewed all documentation for this visit. The documentation on 04/08/2024 for the exam, diagnosis, procedures, and orders are all accurate and complete.

## 2024-04-08 ENCOUNTER — Ambulatory Visit: Payer: Self-pay | Admitting: Family Medicine

## 2024-04-08 ENCOUNTER — Ambulatory Visit: Admitting: Family Medicine

## 2024-04-08 ENCOUNTER — Ambulatory Visit (HOSPITAL_BASED_OUTPATIENT_CLINIC_OR_DEPARTMENT_OTHER)
Admission: RE | Admit: 2024-04-08 | Discharge: 2024-04-08 | Disposition: A | Source: Ambulatory Visit | Attending: Family Medicine

## 2024-04-08 VITALS — BP 123/40 | HR 89 | Temp 98.0°F | Ht 61.0 in | Wt 193.8 lb

## 2024-04-08 DIAGNOSIS — Z96611 Presence of right artificial shoulder joint: Secondary | ICD-10-CM | POA: Diagnosis not present

## 2024-04-08 DIAGNOSIS — R509 Fever, unspecified: Secondary | ICD-10-CM | POA: Diagnosis not present

## 2024-04-08 DIAGNOSIS — R059 Cough, unspecified: Secondary | ICD-10-CM | POA: Diagnosis not present

## 2024-04-08 DIAGNOSIS — R051 Acute cough: Secondary | ICD-10-CM

## 2024-04-08 MED ORDER — AZITHROMYCIN 250 MG PO TABS: ORAL_TABLET | ORAL | 0 refills | Status: AC

## 2024-04-08 MED ORDER — PREDNISONE 20 MG PO TABS: 40.0000 mg | ORAL_TABLET | Freq: Every day | ORAL | 0 refills | Status: AC

## 2024-04-08 MED ORDER — GUAIFENESIN ER 600 MG PO TB12: 1200.0000 mg | ORAL_TABLET | Freq: Two times a day (BID) | ORAL | 0 refills | Status: DC

## 2024-04-08 MED ORDER — PROMETHAZINE-DM 6.25-15 MG/5ML PO SYRP: 5.0000 mL | ORAL_SOLUTION | Freq: Four times a day (QID) | ORAL | 0 refills | Status: AC | PRN

## 2024-04-10 ENCOUNTER — Ambulatory Visit: Admitting: Physical Therapy

## 2024-04-10 ENCOUNTER — Ambulatory Visit: Admitting: Occupational Therapy

## 2024-04-10 ENCOUNTER — Encounter: Payer: Self-pay | Admitting: Occupational Therapy

## 2024-04-10 ENCOUNTER — Encounter: Payer: Self-pay | Admitting: Physical Therapy

## 2024-04-10 DIAGNOSIS — M25642 Stiffness of left hand, not elsewhere classified: Secondary | ICD-10-CM

## 2024-04-10 DIAGNOSIS — M6281 Muscle weakness (generalized): Secondary | ICD-10-CM

## 2024-04-10 DIAGNOSIS — G8929 Other chronic pain: Secondary | ICD-10-CM

## 2024-04-10 DIAGNOSIS — R278 Other lack of coordination: Secondary | ICD-10-CM

## 2024-04-10 DIAGNOSIS — R252 Cramp and spasm: Secondary | ICD-10-CM

## 2024-04-10 DIAGNOSIS — M542 Cervicalgia: Secondary | ICD-10-CM

## 2024-04-10 DIAGNOSIS — M25641 Stiffness of right hand, not elsewhere classified: Secondary | ICD-10-CM

## 2024-04-10 DIAGNOSIS — M25512 Pain in left shoulder: Secondary | ICD-10-CM

## 2024-04-10 DIAGNOSIS — M79641 Pain in right hand: Secondary | ICD-10-CM

## 2024-04-10 DIAGNOSIS — M79642 Pain in left hand: Secondary | ICD-10-CM

## 2024-04-10 DIAGNOSIS — M25611 Stiffness of right shoulder, not elsewhere classified: Secondary | ICD-10-CM

## 2024-04-10 NOTE — Therapy (Signed)
 OUTPATIENT PHYSICAL THERAPY SHOULDER    Patient Name: Veronica Galvan MRN: [id number] DOB:1955-06-14, 68 y.o., female Today's Date: 04/10/2024  END OF SESSION:  PT End of Session - 04/10/24 0759     Visit Number 21    Date for Recertification  04/15/24    Authorization Type [employer] [place]    PT Start Time [phone]    PT Stop Time 0858    PT Time Calculation (min) 60 min    Activity Tolerance Patient tolerated treatment well    Behavior During Therapy [facility] for tasks assessed/performed           Past Medical History:  Diagnosis Date   Allergy     Anxiety    Arthritis    hands   Cataract    Diabetes mellitus without complication (HCC)    type 2   Family history of adverse reaction to anesthesia    son has malignant hyperthermia, daughter does not daughter recently had c section without problems   GERD (gastroesophageal reflux disease)    Headache    sinus   Hyperlipidemia    Hypertension    PONV (postoperative nausea and vomiting)    nausea only   Ulcer, stomach peptic yrs ago   Past Surgical History:  Procedure Laterality Date   APPENDECTOMY     both hells bone spur repair     both heels with metal clips   both shoulder rotator cuff repair     CESAREAN SECTION     x 1   COLONOSCOPY WITH PROPOFOL  N/A 09/07/2016   Procedure: COLONOSCOPY WITH PROPOFOL ;  Surgeon: Vicci Gladis POUR, MD;  Location: [facility];  Service: Endoscopy;  Laterality: N/A;   colonscopy  06/2011   polyps   ELBOW FRACTURE SURGERY Left    EYE SURGERY     FRACTURE SURGERY     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/10/2022   Procedure: REVERSE SHOULDER ARTHROPLASTY;  Surgeon: Melita Drivers, MD;  Location: [facility];  Service: Orthopedics;  Laterality: Right;  Please follow in room 6 if able   TUBAL LIGATION     VESICO-VAGINAL FISTULA REPAIR     Patient Active Problem List   Diagnosis Date Noted   Aortic atherosclerosis 02/07/2024   Chronic right shoulder pain 02/07/2024   Gastroesophageal  reflux disease 11/16/2021   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 08/18/2021   Dermatofibroma 08/18/2021   History of malignant neoplasm of skin 08/18/2021   Lentigo 08/18/2021   Melanocytic nevi of trunk 08/18/2021   Nevus lipomatosus cutaneous superficialis 08/18/2021   Rosacea 08/18/2021   Actinic keratosis 08/18/2021   Sensorineural hearing loss (SNHL) of left ear with unrestricted hearing of right ear 07/26/2021   Tinnitus of left ear 07/26/2021   Acute recurrent maxillary sinusitis 06/22/2021   Primary hypertension 01/12/2021   Hyperlipidemia associated with type 2 diabetes mellitus (HCC) 01/12/2021   Arthritis 01/12/2021   Type II diabetes mellitus (HCC) 11/25/2019   Ingrown toenail 09/05/2018   Neck pain 01/29/2018   Tick bite 10/24/2017    PCP: [name], [name]  REFERRING PROVIDER:[name], MD  REFERRING DIAG: s/p right reverse TSA with complications  THERAPY DIAG:  Muscle weakness (generalized)  Stiffness of right shoulder, not elsewhere classified  Chronic right shoulder pain  Cervicalgia  Spasm  Acute pain of left shoulder  Rationale for Evaluation and Treatment: Rehabilitation  ONSET DATE: 01/01/24  SUBJECTIVE:  SUBJECTIVE STATEMENT:  Patient has been very sick with a sinus infection, she reports that she has really cancelled a lot of things and has not done much, she reports that her shoulders are really aching, pain at rest a 6/10   Evaluation:  Patient had a fall on her deck, tripped on a board that was sticking up and had a severe fracture of the right proximal humerus, 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 We saw her in PT for many visits.  She had good progress but during a very busy time from Thanksgiving to [place] she started  having pain and some regression of motion.  She has had x-rays, bone scan and a CT.  She saw [name] after the CT.  Feels like it could be an infection, PT vs a revision.   11/30/23 Patient reports that she saw the surgeon she is frustrated and upset as she feels that she still has no answers, it is felt that there is no loosening and no infection.  She is still hurting with basic ADL's, her AROM decreased beginning in March, as she started having increased pain in mid January after a very busy and active few month, she has an order for the left shoulder and the okay for us  to see her for her right shoulder for another month PAIN:  Are you having pain? Yes: NPRS scale: 7/10 Pain location: right shoulder, HA, left shoulder pain up to 8/10 with motions Pain description: dull ache at rest, sharp with motions Aggravating factors: quick motions, movements pain up to 10/10 Relieving factors: ice, rest, Tylenol  at best 3/10  PRECAUTIONS: none  RED FLAGS: None   WEIGHT BEARING RESTRICTIONS: No  FALLS:  Has patient fallen in last 6 months? no  LIVING ENVIRONMENT: Lives with: lives alone Lives in: House/apartment Stairs: No Has following equipment at home: None  OCCUPATION: retired  PLOF: Independent, very active with grandkids, does 5 [facility] a week [employer]  PATIENT GOALS:dress without difficulty, do hair, have good ROM and less pain  NEXT MD VISIT: Mid November  OBJECTIVE:   DIAGNOSTIC FINDINGS:  See above  PATIENT SURVEYS:    COGNITION: Overall cognitive status: Within functional limits for tasks assessed     SENSATION: WFL  POSTURE: Fwd head, rounded shoulders, elevated and guarded shoulder  UPPER EXTREMITY ROM:   Active ROM Right AROM  eval Left AROM Eval Right AROM 02/22/24 Right  AROM 03/20/24                  AROM 11/30/23  Shoulder flexion 105 125 112 118                  R 120  Shoulder extension                        Shoulder abduction 85 100 90  93                  Right 100  Shoulder adduction                        Shoulder internal rotation 0 30  10                  Right 28  Shoulder external rotation 46 65  j                    Right 60  Elbow flexion                        Elbow extension                        Wrist flexion                        Wrist extension                        Wrist ulnar deviation                        Wrist radial deviation                        Wrist pronation                        Wrist supination                        (Blank rows = not tested)  UPPER EXTREMITY MMT:  In the available ROM  MMT Right eval Left eval  Shoulder flexion 4- 4-  Shoulder extension    Shoulder abduction    Shoulder adduction    Shoulder internal rotation 4+ 4+  Shoulder external rotation 4-P! 4  Middle trapezius    Lower trapezius    Elbow flexion    Elbow extension    Wrist flexion    Wrist extension    Wrist ulnar deviation    Wrist radial deviation    Wrist pronation    Wrist supination    Grip strength (lbs)    (Blank rows = not tested)  PALPATION:  Very tight and tender in the pectoral, upper trap, the entire right upper arm, slight warmth in the  right anterior lateral arm  Positive impingement on the right   TODAY'S TREATMENT:                                                                                                                                         DATE:  04/10/24 Passive stretch bilateral UE's Passive stretch LE's STM to the upper traps, neck and the bilateral UE's very tender today  04/04/24 Passive stretch of the LE's  Passive stretch of the UE's STM to the LE's STM to the upper traps and rhomboids Vaso to the left shoulder  04/03/24 Nustep level 4 x 5 minutes Passive stretch shoulders bilaterally STM to the neck, upper traps and rhomboids Vaso 34 degrees right shoulder  03/26/24 UBE L 2 each way Passive stretch  bilateral UE's STM to the upper traps, neck and rhomboids, some MFR to knots in the upper traps Vaso 34 degrees  03/20/24 UBE level 5 x 6 minutes Passive stretch bilateral UE's, passive stretch LE's STM to the upper traps, neck and rhomboids, some MFR to knots in the upper traps Vaso 34 degrees  03/19/24 UBE level 4 x 6 minutes 20# row 20# lats 5# straight arm pulls 5# chest press 3# serratus and isometric circles 2# wate bar OHP 2# wate bar extensions with PT assist Passive stretch LE's and the right shoulder all motions Vaso low pressure 34 degrees right shoulder  03/14/24 UBE level 4 x 6 minutes Rows 20# Lats 20# 2# wall slides and circles Passive stretch bilateral shoulders 3# wate bar chest press 3# wate bar flexion 5# small chest press, serratus press and isometric circles PNF supine bilateral shoulders Vaso right shoulder 34 degrees  03/12/24 Rows 20# 2x10 Lats 20# 2x10 Chest press 5# 2x10 UBE level 4 x 6 minutes 3# serratus 3# isometric circles 2# ER/IR Passive strength right shoulder all motions, joint distraction Vaso 34 degrees low pressure right shoulder  03/07/24 Nustep level 4 x 7 minutes 5# straight arm pulls Ball vs wall 4 positions 15#  rows 15# lats Supine 3# isometric circles bilaterally 3# serratus bilaterally Passive stretches bilaterally UE's Vaso medium pressure 34 degrees  03/05/24 Nustep level 5 x 6 minutes 10# straight arm pulls and then 5# 2# slides 2# circles 2# extension Shrugs and upper trap and levator stretches Passive stretch to the right shoulder and the LE's  02/27/24 Nustep level 5 x 6 minutes 20# rows 20# lats 5# chest press 10# biceps 25# triceps 3# extension with wate bar behind the back  3# wate bar OHP to finger ladder LE stretches Passive ROM right shoulder all motions to tolerance   PATIENT EDUCATION: Education details: poc Person educated: Patient Education method: Programmer, Multimedia, Facilities Manager, Actor cues, Verbal cues, and Handouts Education comprehension: verbalized understanding  HOME EXERCISE PROGRAM:  ASSESSMENT:  CLINICAL IMPRESSION:  Patient reports that she really is not feeling well, she says a sinus issue. She reports that without doing much she thought the shoulders would feel better, she is now reporting of aches at rest, she is very tender in the bilateral upper arms, very tender biceps and deltoid areas.   The worry being she will have the right TSA in January and if the left shoulder is hurting how will she do things because she struggled with a good shoulder after the last TSA.  We may switch to trying to help with the left shoulder pain   Patient reports that she is doing okay, she reports that she is trying to back off of some activities and break things down over time to ease the stress and strain thinks this is helping as she is making soup for the community and is doing this over a few days instead of all at one time..She has a lot of knots in the upper traps, neck and rhomboids, her ROM is improving.  She is having left shoulder pain as well due to it compensating so much, she has worries about this and needing the left shoulder to be strong so when she gets  the right TSA she can have a good shoulder  Evaluation:  Patient is very frustrated, she had the right reverse TSA last July.  She did very well until around Christmas, then she really started having increased pain and lost some ROM.  She has had blood work done, US , bone scan and CT performed.  There has been some talk of a possible infection, MD feels like she may benefit from a revision.  She would like to try PT again and the MD agreed.  She has lost ROM since I last saw her, she also is having some significant left shoulder pain from over using it.   July 2025: Patient saw surgeon, the bone scan said did not feel that there was infection or loosening however the surgeon is not so sure, he wants to do an US  to be sure, that is scheduled for the next few weeks, since she has not been to PT in a month I re-measured her ROM, she has lost at least 15 degrees of motion and more for all motions.  She is also having more pain in the left shoulder possibly from overdoing it, because she is trying to not use the right shoulder.    OBJECTIVE IMPAIRMENTS: cardiopulmonary status limiting activity, decreased activity tolerance, decreased endurance, decreased ROM, decreased strength, increased edema, increased muscle spasms, impaired flexibility, impaired UE functional use, improper body mechanics, postural dysfunction, and pain.  REHAB POTENTIAL: Good  CLINICAL DECISION MAKING: Evolving/moderate complexity  EVALUATION COMPLEXITY: Low   GOALS: Goals reviewed with patient? Yes  SHORT TERM GOALS: Target date: 01/31/24  Independent with initial HEP Goal status: met 01/31/24  LONG TERM GOALS: Target date: 04/15/24  Decrease pain 50% Goal status: progressing 03/20/24 and 03/26/24 2.  Dress without difficulty Goal status: progressing 03/20/24 and 03/26/24 3.  Do hair without difficulty Goal status: progressing 03/14/24 and 03/26/24 4.  Increase AROM right shoulder flexion to 130 degrees Goal status:   5.  Increase right shoulder ER to 60 degrees Goal status: progressing 03/14/24  6.  Return to water  aerobics and or gym activity Goal status: on going 02/26/24  and 03/26/24  PLAN:  PT FREQUENCY: 1-2x/week  PT DURATION: 12 weeks  PLANNED INTERVENTIONS: Therapeutic exercises, Therapeutic activity, Neuromuscular re-education, Balance training, Gait training, Patient/Family education, Self Care, Joint mobilization, Dry Needling, Electrical stimulation, Cryotherapy, Vasopneumatic device, and Manual therapy  PLAN FOR NEXT SESSION: Try to regain some of her ROM and function, treat pain as needed, address the left shoulder as well  04/10/2024, 7:59 AM [facility] at [facility] [place], [place], [address] Phone: [phone]   Fax:  [phone]

## 2024-04-10 NOTE — Therapy (Signed)
 OUTPATIENT OCCUPATIONAL THERAPY ORTHO  treatment  Patient Name: Veronica Galvan MRN: [id number] DOB:[birth year], 68 y.o., female Today's Date: 04/10/2024  PCP: Almarie Birmingham, NP REFERRING PROVIDER: Almarie Birmingham, NP  END OF SESSION:  OT End of Session - 04/10/24 0954     Visit Number 7    Number of Visits 12    Date for Recertification  05/15/24    Authorization Type [employer] M[employer]    Authorization Time Period 12 weeks    Authorization - Visit Number 7    Progress Note Due on Visit 10    OT Start Time 0912    OT Stop Time 1000    OT Time Calculation (min) 48 min    Activity Tolerance Patient tolerated treatment well    Behavior During Therapy WFL for tasks assessed/performed              Past Medical History:  Diagnosis Date   Allergy     Anxiety    Arthritis    hands   Cataract    Diabetes mellitus without complication (HCC)    type 2   Family history of adverse reaction to anesthesia    son has malignant hyperthermia, daughter does not daughter recently had c section without problems   GERD (gastroesophageal reflux disease)    Headache    sinus   Hyperlipidemia    Hypertension    PONV (postoperative nausea and vomiting)    nausea only   Ulcer, stomach peptic yrs ago   Past Surgical History:  Procedure Laterality Date   APPENDECTOMY     both hells bone spur repair     both heels with metal clips   both shoulder rotator cuff repair     CESAREAN SECTION     x 1   COLONOSCOPY WITH PROPOFOL  N/A 09/07/2016   Procedure: COLONOSCOPY WITH PROPOFOL ;  Surgeon: Vicci Gladis POUR, MD;  Location: [facility];  Service: Endoscopy;  Laterality: N/A;   colonscopy  06/2011   polyps   ELBOW FRACTURE SURGERY Left    EYE SURGERY     FRACTURE SURGERY     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/10/2022   Procedure: REVERSE SHOULDER ARTHROPLASTY;  Surgeon: Melita Drivers, MD;  Location: [facility];  Service: Orthopedics;  Laterality: Right;  Please follow in room 6 if able    TUBAL LIGATION     VESICO-VAGINAL FISTULA REPAIR     Patient Active Problem List   Diagnosis Date Noted   Aortic atherosclerosis 02/07/2024   Chronic right shoulder pain 02/07/2024   Gastroesophageal reflux disease 11/16/2021   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 08/18/2021   Dermatofibroma 08/18/2021   History of malignant neoplasm of skin 08/18/2021   Lentigo 08/18/2021   Melanocytic nevi of trunk 08/18/2021   Nevus lipomatosus cutaneous superficialis 08/18/2021   Rosacea 08/18/2021   Actinic keratosis 08/18/2021   Sensorineural hearing loss (SNHL) of left ear with unrestricted hearing of right ear 07/26/2021   Tinnitus of left ear 07/26/2021   Acute recurrent maxillary sinusitis 06/22/2021   Primary hypertension 01/12/2021   Hyperlipidemia associated with type 2 diabetes mellitus (HCC) 01/12/2021   Arthritis 01/12/2021   Type II diabetes mellitus (HCC) 11/25/2019   Ingrown toenail 09/05/2018   Neck pain 01/29/2018   Tick bite 10/24/2017    ONSET DATE: 02/07/24  REFERRING DIAG: M19.90 (ICD-10-CM) - Arthritis      THERAPY DIAG:  Muscle weakness (generalized)  Pain in right hand  Pain in left hand  Other  lack of coordination  Stiffness of left hand, not elsewhere classified  Stiffness of right hand, not elsewhere classified  Rationale for Evaluation and Treatment: Rehabilitation  SUBJECTIVE:   SUBJECTIVE STATEMENT: Pt reports her hands are painful since she has not been using due to illness. 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 Pt was previously seen for OT and d/c 12/06/23  PRECAUTIONS: hx of R reverse toal shoulder, none for hands    WEIGHT BEARING RESTRICTIONS: No  PAIN:  Are you having pain? Yes: NPRS scale: R hand 6/10, L hand 6/10 Pain location: bilateral hands Pain description:  aching Aggravating factors: overuse, waking up in pain Relieving factors: paraffin Pt has R shoulder pain 6-8/10, PT is addressing, OT will not address., FALLS: Has patient fallen in last 6 months? No  LIVING ENVIRONMENT: Lives with: lives alone Lives in: House/apartment   PLOF: Independent  PATIENT GOALS: decrease pain in hands, improve function/ mobility  NEXT MD VISIT: unknown  OBJECTIVE:  Note: Objective measures were completed at Evaluation unless otherwise noted.  HAND DOMINANCE: Right  ADLs:Pt reports increased overall difficulty with ADLs due to pain and stiffness. Pt is mod I with basic ADLs. She has difficulty with buttons, styling hair, opening jars.    FUNCTIONAL OUTCOME MEASURES: Upper Extremity Functional Scale (UEFS): 33/80  UPPER EXTREMITY ROM:   RUE composite finger flexion 70% (3cm from palm at middle finger)     LUE composite finer flexion 60% (3.5cm from palm at middle finger) bilateral UE's grossly 90-95% composite finger extension     HAND FUNCTION: Grip strength: Right: 18 lbs; Left: 15 lbs, Lateral pinch: Right: 10 lbs, Left: 12 lbs, and Tip pinch: Right 4 lbs, Left: 2 lbs  COORDINATION:NT   SENSATION: WFL  EDEMA: Pt with bony defomities at PIP joints of all digits which pt reports are bone spurs, Pt also has bony deformity at DIP for right thumb, index and 5th digit and LUE small and index fingers   COGNITION: Overall cognitive status: Within functional limits for tasks assessed   OBSERVATIONS: Pleasant female well known to therapist from previous occupational therapy at this site   TREATMENT DATE:Paraffin to right hand for pain  and stiffness 8 mins, no adverse reactions while therapist performed US  to left hand and digits 3.3 mhz, 0.8 w/cm 2, 20% x 8 mins no adverse reactions. Paraffin to left hand for pain/ stiffness x 8 mins, no adverse reactions while therapist performed US  to right hand and digits 3.3 mhz, 0.8 w/cm 2, 20% x 8 mins  no adverse reactions. Soft tissue/ joint mobs to hands, fingers  and wrists as well as P/ROM composite and individual finger flexion to digits. Therapist checked pinch strength and ROM at end of session. Pt met pinch strength goals.  12/3/25paraffin to right hand for pain management x 8 mins, no adverse reactions while therapist performed US  to left hand and digits 3.3 mhz, 0.8 w/cm 2, 20% x 8 mins no adverse reactions. Paraffin to left hand for pain management x 8 mins, no adverse reactions while therapist performed US  to right hand and digits 3.3 mhz, 0.8 w/cm 2, 20% x 8 mins no adverse reactions. Soft tissue/ joint mobs to hands and wrists as well as P/ROM comosite finger flexion to digits.  11/18/25Paraffin to right hand for pain management x 8 mins, no adverse reactions while therapist performed US   to left hand and digits 3.3 mhz, 0.8 w/cm 2, 20% x 8 mins no adverse reactions. Paraffin to left hand for pain management x 8 mins, no adverse reactions while therapist performed US  to right hand and digits 3.3 mhz, 0.8 w/cm 2, 20% x 8 mins no adverse reactions. Pt reports seeking assistance for cooking soup for church and receiving a padddle at therapist recommendation for joint protection when stirring. Therpist recommends pt coniders delgating the soup task to others in the future or that she seeks additional assist. araffin to right hand for pain management x 8 mins, no adverse reactions while therapist performed US  to left hand and digits 3.3 mhz, 0.8 w/cm 2, 20% x 8 mins no adverse reactions. Paraffin to left hand for pain management x 8 mins, no adverse reactions while therapist performed US  to right hand and digits 3.3 mhz, 0.8 w/cm 2, 20% x 8 mins no adverse reactions. with lotion. P/ROM to digits in flexion individually and compositely. A/ROM finger thumb opposition as able to bilateral hands  11/12/24Paraffin to right hand for pain management x 8 mins, no adverse reactions while therapist  performed US  to left hand and digits 3.3 mhz, 0.8 w/cm 2, 20% x 8 mins no adverse reactions. Paraffin to left hand for pain management x 8 mins, no adverse reactions while therapist performed US  to right hand and digits 3.3 mhz, 0.8 w/cm 2, 20% x 8 mins no adverse reactions. Pt reports making changes to her baking schedule and ordering a padddle at therapist recommendation for joint protection. Soft tissue and joint mobs to bilateral hands, wrist and forearms. P/ROM to digits in flexion individually and compositely. Pt demonstrates improved flexibility end of session.       03/05/24 Paraffin to right hand for pain management x 8 mins, no adverse reactions while therapist performed US  to left hand and digits 3.3 mhz, 0.8 w/cm 2, 20% x 8 mins no adverse reactions. Paraffin to left hand for pain management x 8 mins, no adverse reactions while therapist performed US  to right hand and digits 3.3 mhz, 0.8 w/cm 2, 20% x 8 mins no adverse reactions.P/ROM to digits in flexion individually and compositely.discussed options for self care with pt's upcoming cooking activities: ie use of paddle, and suggestions for when pt has shoulder sugery.   Soft tissue and joint mobs to bilateral hands, wrist and forearms. Grip strengthening with stress tree for resisted grip  02/26/24 Paraffin to right hand for pain management x 8 mins, no adverse reactions while therapist perfromed US  to left hand and digits 3.3 mhz, 0.8 w/cm 2, 20% x 8 mins no adverse reactions. Paraffin to left hand for pain management x 8 mins, no adverse reactions while therapist performed US  to right hand and digits 3.3 mhz, 0.8 w/cm 2, 20% x 8 mins no adverse reactions.P/ROM to digits in flexion  individually and compositely.  Soft tissue and joint mobs to bilateral hands, wrist and forearms.  Discussed improtance of balancing activity and rest in order to management pain and prevent injury.  02/21/24- eval Paraffin to bilateral hands and wrists for  pain management x 8 mins, no adverse reactions. P/ROM to digits in flexion  individually and compositely soft tissue mobs to bilateral hands. Pt reports decreased pain end of session.      PATIENT EDUCATION: Education details: see above Person educated: Patient Education method: Explanation Education comprehension: verbalized understanding  HOME EXERCISE PROGRAM: n/a  GOALS: Goals reviewed with patient? Yes  SHORT TERM GOALS:  Target date: 03/23/24  I with inital HEP.  Goal status:  ongoing 03/13/24  2.    Pt will improve RUE tip pinch by 2 lbs(from baseline) for increased ease with daily activities. Revised goal:Pt will improve RUE tip pinch to 7 lbs for increased ease with daily activities.                               baseline: RUE tip 4lbs, LUE tip 2 lbs                          Goal status: met 6 lbs 04/10/24, continue with revised goal  3.  Pt will improve LUE tip pinch by 2 lbs(from baseline) for increased ease with daily activities. Revised goal:Pt will improve LUE tip pinch to 5 lbs for increased ease with daily activities. baseline: RUE tip 4lbs, LUE tip 2 lbs Goal status:  met 4lbs 04/10/24, continue with revised goal  4.  I with adapted strategies/ adapted equipment to minimize pain and to increase pt I with ADLs/IADLs   Goal status: ongoing, pt has ordered a paddle to use for stirring soup, 03/19/24  5.  Pt will report bilateral hand pain no greater than 4/10 for ADLs/IADLS Baseline: RUE 7/10, LUE 6/10 Goal status: ongoing 03/13/24- pain continues to improve 4/10 today 03/19/24, 6/10 today  -04/10/24      LONG TERM GOALS: Target date: 05/15/24      Pt will improve RUE grip strength to 22 # or greater for increased functional use Baseline: RUE 18# Goal status:  ongoing 04/10/24  2.  Pt will improve LUE grip strength to 25 # or greater for increased functional use Baseline: LUE 21# Goal status:  ongoing 04/10/24   3.Pt will demonstrate improved  composite finger flexion for ADLs as evidenced by pt. bringing middle fingertip for RUE within 2.75 cm of palm.             Baseline: 3 cm from palm to middle fingertip             Goal status: met today,  pt demonstrates 2 cm from palm today, met 04/10/24- will monitor for consistency   4. .Pt will demonstrate improved composite finger flexion for ADLs as evidenced by pt. bringing middle fingertip for LUE within 3.25 of palm.             Baseline: 3.5 cm from palm to middle fingertip             Goal status: met today, pt demonstrates 2 cm from palm today, met 04/10/24- will monitor for consistency    5. check 9 hole peg test and set goals prn- deferred 04/10/24 ASSESSMENT:  CLINICAL IMPRESSION: Patient is progressing towards goals.Pt met her tip pinch goals, and ROM was significantly improved at end of session. Plan to monitor ROM for consistenty. Pt demonstrates improving bilateral functional use.SABRAPERFORMANCE DEFICITS: in functional skills including ADLs, IADLs, coordination, dexterity, proprioception, sensation, edema, ROM, strength, pain, flexibility, Fine motor control, Gross motor control, endurance, decreased knowledge of precautions, decreased knowledge of use of DME, and UE functional use,and psychosocial skills including coping strategies, environmental adaptation, habits, interpersonal interactions, and routines and behaviors.   IMPAIRMENTS: are limiting patient from ADLs, IADLs, rest and sleep, play, leisure, and social participation.   COMORBIDITIES: may have co-morbidities  that affects occupational performance. Patient will benefit from skilled OT to address above  impairments and improve overall function.  MODIFICATION OR ASSISTANCE TO COMPLETE EVALUATION: No modification of tasks or assist necessary to complete an evaluation.  OT OCCUPATIONAL PROFILE AND HISTORY: Detailed assessment: Review of records and additional review of physical, cognitive, psychosocial history related to  current functional performance.  CLINICAL DECISION MAKING: LOW - limited treatment options, no task modification necessary  REHAB POTENTIAL: Good  EVALUATION COMPLEXITY: Low      PLAN:  OT FREQUENCY: 1-2x/week  OT DURATION: 12 weeks  PLANNED INTERVENTIONS: 97168 OT Re-evaluation, 97535 self care/ADL training, 02889 therapeutic exercise, 97530 therapeutic activity, 97112 neuromuscular re-education, 97140 manual therapy, 97035 ultrasound, 97018 paraffin, 02989 moist heat, 97010 cryotherapy, 97014 electrical stimulation unattended, 97760 Orthotic Initial, 97763 Orthotic/Prosthetic subsequent, scar mobilization, passive range of motion, functional mobility training, energy conservation, coping strategies training, patient/family education, and DME and/or AE instructions  RECOMMENDED OTHER SERVICES: n/a  CONSULTED AND AGREED WITH PLAN OF CARE: Patient  PLAN FOR NEXT SESSION:,  A/ROM HEP, paraffin, US , gentle strengthening   Maryana Pittmon, OT 04/10/2024, 10:52 AM

## 2024-04-11 ENCOUNTER — Encounter: Payer: Self-pay | Admitting: Physical Therapy

## 2024-04-11 ENCOUNTER — Ambulatory Visit: Admitting: Physical Therapy

## 2024-04-11 DIAGNOSIS — M25611 Stiffness of right shoulder, not elsewhere classified: Secondary | ICD-10-CM

## 2024-04-11 DIAGNOSIS — M6281 Muscle weakness (generalized): Secondary | ICD-10-CM | POA: Diagnosis not present

## 2024-04-11 DIAGNOSIS — G8929 Other chronic pain: Secondary | ICD-10-CM

## 2024-04-11 DIAGNOSIS — R252 Cramp and spasm: Secondary | ICD-10-CM

## 2024-04-11 DIAGNOSIS — M542 Cervicalgia: Secondary | ICD-10-CM

## 2024-04-11 DIAGNOSIS — M25512 Pain in left shoulder: Secondary | ICD-10-CM

## 2024-04-11 NOTE — Therapy (Signed)
 OUTPATIENT PHYSICAL THERAPY SHOULDER    Patient Name: Veronica Galvan MRN: [id number] DOB:03-23-1956, 68 y.o., female Today's Date: 04/11/2024  END OF SESSION:  PT End of Session - 04/11/24 0800     Visit Number 22    Date for Recertification  04/15/24    Authorization Type [employer] [place]    PT Start Time [phone]    PT Stop Time 0058    PT Time Calculation (min) 1020 min    Activity Tolerance Patient tolerated treatment well    Behavior During Therapy [facility] for tasks assessed/performed           Past Medical History:  Diagnosis Date   Allergy     Anxiety    Arthritis    hands   Cataract    Diabetes mellitus without complication (HCC)    type 2   Family history of adverse reaction to anesthesia    son has malignant hyperthermia, daughter does not daughter recently had c section without problems   GERD (gastroesophageal reflux disease)    Headache    sinus   Hyperlipidemia    Hypertension    PONV (postoperative nausea and vomiting)    nausea only   Ulcer, stomach peptic yrs ago   Past Surgical History:  Procedure Laterality Date   APPENDECTOMY     both hells bone spur repair     both heels with metal clips   both shoulder rotator cuff repair     CESAREAN SECTION     x 1   COLONOSCOPY WITH PROPOFOL  N/A 09/07/2016   Procedure: COLONOSCOPY WITH PROPOFOL ;  Surgeon: Vicci Gladis POUR, MD;  Location: [facility];  Service: Endoscopy;  Laterality: N/A;   colonscopy  06/2011   polyps   ELBOW FRACTURE SURGERY Left    EYE SURGERY     FRACTURE SURGERY     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/10/2022   Procedure: REVERSE SHOULDER ARTHROPLASTY;  Surgeon: Melita Drivers, MD;  Location: [facility];  Service: Orthopedics;  Laterality: Right;  Please follow in room 6 if able   TUBAL LIGATION     VESICO-VAGINAL FISTULA REPAIR     Patient Active Problem List   Diagnosis Date Noted   Aortic atherosclerosis 02/07/2024   Chronic right shoulder pain 02/07/2024    Gastroesophageal reflux disease 11/16/2021   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 08/18/2021   Dermatofibroma 08/18/2021   History of malignant neoplasm of skin 08/18/2021   Lentigo 08/18/2021   Melanocytic nevi of trunk 08/18/2021   Nevus lipomatosus cutaneous superficialis 08/18/2021   Rosacea 08/18/2021   Actinic keratosis 08/18/2021   Sensorineural hearing loss (SNHL) of left ear with unrestricted hearing of right ear 07/26/2021   Tinnitus of left ear 07/26/2021   Acute recurrent maxillary sinusitis 06/22/2021   Primary hypertension 01/12/2021   Hyperlipidemia associated with type 2 diabetes mellitus (HCC) 01/12/2021   Arthritis 01/12/2021   Type II diabetes mellitus (HCC) 11/25/2019   Ingrown toenail 09/05/2018   Neck pain 01/29/2018   Tick bite 10/24/2017    PCP: [name], [name]  REFERRING PROVIDER:[name], MD  REFERRING DIAG: s/p right reverse TSA with complications  THERAPY DIAG:  Muscle weakness (generalized)  Stiffness of right shoulder, not elsewhere classified  Chronic right shoulder pain  Cervicalgia  Spasm  Acute pain of left shoulder  Rationale for Evaluation and Treatment: Rehabilitation  ONSET DATE: 01/01/24  SUBJECTIVE:  SUBJECTIVE STATEMENT:  Patient continues to not feel well, she reports tight and cramping all over  Evaluation:  Patient had a fall on her deck, tripped on a board that was sticking up and had a severe fracture of the right proximal humerus, 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 We saw her in PT for many visits.  She had good progress but during a very busy time from Thanksgiving to [place] Years she started having pain and some regression of motion.  She has had x-rays, bone scan and a CT.  She saw [name] after the  CT.  Feels like it could be an infection, PT vs a revision.   11/30/23 Patient reports that she saw the surgeon she is frustrated and upset as she feels that she still has no answers, it is felt that there is no loosening and no infection.  She is still hurting with basic ADL's, her AROM decreased beginning in March, as she started having increased pain in mid January after a very busy and active few month, she has an order for the left shoulder and the okay for us  to see her for her right shoulder for another month PAIN:  Are you having pain? Yes: NPRS scale: 7/10 Pain location: right shoulder, HA, left shoulder pain up to 8/10 with motions Pain description: dull ache at rest, sharp with motions Aggravating factors: quick motions, movements pain up to 10/10 Relieving factors: ice, rest, Tylenol  at best 3/10  PRECAUTIONS: none  RED FLAGS: None   WEIGHT BEARING RESTRICTIONS: No  FALLS:  Has patient fallen in last 6 months? no  LIVING ENVIRONMENT: Lives with: lives alone Lives in: House/apartment Stairs: No Has following equipment at home: None  OCCUPATION: retired  PLOF: Independent, very active with grandkids, does 5 [facility] a [facility] [employer]  PATIENT GOALS:dress without difficulty, do hair, have good ROM and less pain  NEXT MD VISIT: Mid November  OBJECTIVE:   DIAGNOSTIC FINDINGS:  See above  PATIENT SURVEYS:    COGNITION: Overall cognitive status: Within functional limits for tasks assessed     SENSATION: WFL  POSTURE: Fwd head, rounded shoulders, elevated and guarded shoulder  UPPER EXTREMITY ROM:   Active ROM Right AROM  eval Left AROM Eval Right AROM 02/22/24 Right  AROM 03/20/24                  AROM 11/30/23  Shoulder flexion 105 125 112 118                  R 120  Shoulder extension                        Shoulder abduction 85 100 90 93                  Right 100  Shoulder adduction                        Shoulder internal rotation 0 30  10                   Right 28  Shoulder external rotation 46 65  j                    Right 60  Elbow flexion                        Elbow extension                        Wrist flexion                        Wrist extension                        Wrist ulnar deviation                        Wrist radial deviation                        Wrist pronation                        Wrist supination                        (Blank rows = not tested)  UPPER EXTREMITY MMT:  In the available ROM  MMT Right eval Left eval  Shoulder flexion 4- 4-  Shoulder extension    Shoulder abduction    Shoulder adduction    Shoulder internal rotation 4+ 4+  Shoulder external rotation 4-P! 4  Middle trapezius    Lower trapezius    Elbow flexion    Elbow extension    Wrist flexion    Wrist extension    Wrist ulnar deviation    Wrist radial deviation    Wrist pronation    Wrist supination    Grip strength (lbs)    (Blank rows = not tested)  PALPATION:  Very tight and tender in the pectoral, upper trap, the entire right upper arm, slight warmth in the right anterior lateral arm  Positive impingement on the right   TODAY'S TREATMENT:                                                                                                                                          DATE:  04/11/24 Passive LE stretches STM to the LE's Passive ROM bilateral UE's STM to the right upper trap, neck, rhomboid and upper arm Right vaso 34 degrees  04/10/24 Passive stretch bilateral UE's Passive stretch LE's STM to the upper traps, neck and the bilateral UE's very tender today  04/04/24 Passive stretch of the LE's  Passive stretch of the UE's STM to the LE's STM to the upper traps and rhomboids Vaso to the left shoulder  04/03/24 Nustep level 4 x 5  minutes Passive stretch shoulders bilaterally STM to the neck, upper traps and rhomboids Vaso 34 degrees right shoulder  03/26/24 UBE L 2 each way Passive stretch bilateral UE's STM to the upper traps, neck and rhomboids, some MFR to knots in the upper traps Vaso 34 degrees  03/20/24 UBE level 5 x 6 minutes Passive stretch bilateral UE's, passive stretch LE's STM to the upper traps, neck and rhomboids, some MFR to knots in the upper traps Vaso 34 degrees  03/19/24 UBE level 4 x 6 minutes 20# row 20# lats 5# straight arm pulls 5# chest press 3# serratus and isometric circles 2# wate bar OHP 2# wate bar extensions with PT assist Passive stretch LE's and the right shoulder all motions Vaso low pressure 34 degrees right shoulder  03/14/24 UBE level 4 x 6 minutes Rows 20# Lats 20# 2# wall slides and circles Passive stretch bilateral shoulders 3# wate bar chest press 3# wate bar flexion 5# small chest press, serratus press and isometric circles PNF supine bilateral shoulders Vaso right shoulder 34 degrees  03/12/24 Rows 20# 2x10 Lats 20# 2x10 Chest press 5# 2x10 UBE level 4 x 6 minutes 3# serratus 3# isometric circles 2# ER/IR Passive strength right shoulder all motions, joint distraction Vaso 34 degrees low pressure right shoulder  03/07/24 Nustep level 4 x 7 minutes 5# straight arm  pulls Ball vs wall 4 positions 15# rows 15# lats Supine 3# isometric circles bilaterally 3# serratus bilaterally Passive stretches bilaterally UE's Vaso medium pressure 34 degrees  03/05/24 Nustep level 5 x 6 minutes 10# straight arm pulls and then 5# 2# slides 2# circles 2# extension Shrugs and upper trap and levator stretches Passive stretch to the right shoulder and the LE's  02/27/24 Nustep level 5 x 6 minutes 20# rows 20# lats 5# chest press 10# biceps 25# triceps 3# extension with wate bar behind the back  3# wate bar OHP to finger ladder LE stretches Passive ROM right shoulder all motions to tolerance   PATIENT EDUCATION: Education details: poc Person educated: Patient Education method: Programmer, Multimedia, Facilities Manager, Actor cues, Verbal cues, and Handouts Education comprehension: verbalized understanding  HOME EXERCISE PROGRAM:  ASSESSMENT:  CLINICAL IMPRESSION:  Patient reports that she really is not feeling well, she says a sinus issue. She is having spasms all over and cramping.  She is very tight and very tender in the LE mms and the upper arms and upper traps.  We may switch to trying to help with the left shoulder pain, strength and function   Patient reports that she is doing okay, she reports that she is trying to back off of some activities and break things down over time to ease the stress and strain thinks this is helping as she is making soup for the community and is doing this over a few days instead of all at one time..She has a lot of knots in the upper traps, neck and rhomboids, her ROM is improving.  She is having left shoulder pain as well due to it compensating so much, she has worries about this and needing the left shoulder to be strong so when she gets the right TSA she can have a good shoulder  Evaluation:  Patient is very frustrated, she had the right reverse TSA last July.  She did very well until around Christmas, then she really started  having increased pain and lost some ROM.  She has had blood work done, US , bone scan and CT performed.  There  has been some talk of a possible infection, MD feels like she may benefit from a revision.  She would like to try PT again and the MD agreed.  She has lost ROM since I last saw her, she also is having some significant left shoulder pain from over using it.   July 2025: Patient saw surgeon, the bone scan said did not feel that there was infection or loosening however the surgeon is not so sure, he wants to do an US  to be sure, that is scheduled for the next few weeks, since she has not been to PT in a month I re-measured her ROM, she has lost at least 15 degrees of motion and more for all motions.  She is also having more pain in the left shoulder possibly from overdoing it, because she is trying to not use the right shoulder.    OBJECTIVE IMPAIRMENTS: cardiopulmonary status limiting activity, decreased activity tolerance, decreased endurance, decreased ROM, decreased strength, increased edema, increased muscle spasms, impaired flexibility, impaired UE functional use, improper body mechanics, postural dysfunction, and pain.  REHAB POTENTIAL: Good  CLINICAL DECISION MAKING: Evolving/moderate complexity  EVALUATION COMPLEXITY: Low   GOALS: Goals reviewed with patient? Yes  SHORT TERM GOALS: Target date: 01/31/24  Independent with initial HEP Goal status: met 01/31/24  LONG TERM GOALS: Target date: 04/15/24  Decrease pain 50% Goal status: progressing 03/20/24 and 03/26/24, 04/11/24 2.  Dress without difficulty Goal status: progressing 03/20/24 and 03/26/24, 04/11/24 3.  Do hair without difficulty Goal status: progressing 03/14/24 and 03/26/24 4.  Increase AROM right shoulder flexion to 130 degrees Goal status:  5.  Increase right shoulder ER to 60 degrees Goal status: progressing 03/14/24, 04/11/24  6.  Return to water  aerobics and or gym activity Goal status: on going 02/26/24   and 03/26/24  PLAN:  PT FREQUENCY: 1-2x/week  PT DURATION: 12 weeks  PLANNED INTERVENTIONS: Therapeutic exercises, Therapeutic activity, Neuromuscular re-education, Balance training, Gait training, Patient/Family education, Self Care, Joint mobilization, Dry Needling, Electrical stimulation, Cryotherapy, Vasopneumatic device, and Manual therapy  PLAN FOR NEXT SESSION: Try to regain some of her ROM and function, treat pain as needed, address the left shoulder as well  04/11/2024, 8:01 AM [facility] [place], [place], [address] Phone: [phone]   Fax:  [phone]

## 2024-04-16 ENCOUNTER — Encounter: Payer: Self-pay | Admitting: Physical Therapy

## 2024-04-16 ENCOUNTER — Ambulatory Visit: Admitting: Occupational Therapy

## 2024-04-16 ENCOUNTER — Ambulatory Visit: Admitting: Physical Therapy

## 2024-04-16 DIAGNOSIS — R252 Cramp and spasm: Secondary | ICD-10-CM

## 2024-04-16 DIAGNOSIS — M6281 Muscle weakness (generalized): Secondary | ICD-10-CM

## 2024-04-16 DIAGNOSIS — M25512 Pain in left shoulder: Secondary | ICD-10-CM

## 2024-04-16 DIAGNOSIS — M542 Cervicalgia: Secondary | ICD-10-CM

## 2024-04-16 DIAGNOSIS — G8929 Other chronic pain: Secondary | ICD-10-CM

## 2024-04-16 DIAGNOSIS — M25611 Stiffness of right shoulder, not elsewhere classified: Secondary | ICD-10-CM

## 2024-04-16 NOTE — Therapy (Signed)
 OUTPATIENT PHYSICAL THERAPY SHOULDER    Patient Name: Veronica Galvan MRN: [id number] DOB:[birth year], 68 y.o., female Today's Date: 04/16/2024  END OF SESSION:  PT End of Session - 04/16/24 1011     Visit Number 23    Date for Recertification  05/17/24    Authorization Type [employer] [place]    PT Start Time 1100    PT Stop Time 1200    PT Time Calculation (min) 60 min    Activity Tolerance Patient tolerated treatment well    Behavior During Therapy WFL for tasks assessed/performed           Past Medical History:  Diagnosis Date   Allergy     Anxiety    Arthritis    hands   Cataract    Diabetes mellitus without complication (HCC)    type 2   Family history of adverse reaction to anesthesia    son has malignant hyperthermia, daughter does not daughter recently had c section without problems   GERD (gastroesophageal reflux disease)    Headache    sinus   Hyperlipidemia    Hypertension    PONV (postoperative nausea and vomiting)    nausea only   Ulcer, stomach peptic yrs ago   Past Surgical History:  Procedure Laterality Date   APPENDECTOMY     both hells bone spur repair     both heels with metal clips   both shoulder rotator cuff repair     CESAREAN SECTION     x 1   COLONOSCOPY WITH PROPOFOL  N/A 09/07/2016   Procedure: COLONOSCOPY WITH PROPOFOL ;  Surgeon: Vicci Gladis POUR, MD;  Location: [facility];  Service: Endoscopy;  Laterality: N/A;   colonscopy  06/2011   polyps   ELBOW FRACTURE SURGERY Left    EYE SURGERY     FRACTURE SURGERY     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/10/2022   Procedure: REVERSE SHOULDER ARTHROPLASTY;  Surgeon: Melita Drivers, MD;  Location: [facility];  Service: Orthopedics;  Laterality: Right;  Please follow in room 6 if able   TUBAL LIGATION     VESICO-VAGINAL FISTULA REPAIR     Patient Active Problem List   Diagnosis Date Noted   Aortic atherosclerosis 02/07/2024   Chronic right shoulder pain 02/07/2024   Gastroesophageal  reflux disease 11/16/2021   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 08/18/2021   Dermatofibroma 08/18/2021   History of malignant neoplasm of skin 08/18/2021   Lentigo 08/18/2021   Melanocytic nevi of trunk 08/18/2021   Nevus lipomatosus cutaneous superficialis 08/18/2021   Rosacea 08/18/2021   Actinic keratosis 08/18/2021   Sensorineural hearing loss (SNHL) of left ear with unrestricted hearing of right ear 07/26/2021   Tinnitus of left ear 07/26/2021   Acute recurrent maxillary sinusitis 06/22/2021   Primary hypertension 01/12/2021   Hyperlipidemia associated with type 2 diabetes mellitus (HCC) 01/12/2021   Arthritis 01/12/2021   Type II diabetes mellitus (HCC) 11/25/2019   Ingrown toenail 09/05/2018   Neck pain 01/29/2018   Tick bite 10/24/2017    PCP: [name], [name]  REFERRING PROVIDER:[name], MD  REFERRING DIAG: s/p right reverse TSA with complications  THERAPY DIAG:  Muscle weakness (generalized)  Stiffness of right shoulder, not elsewhere classified  Chronic right shoulder pain  Acute pain of left shoulder  Spasm  Cervicalgia  Rationale for Evaluation and Treatment: Rehabilitation  ONSET DATE: 01/01/24  SUBJECTIVE:  SUBJECTIVE STATEMENT:  Patient continues to report general malaise and fatigue as well as LE thigh pain.  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 We saw her in PT for many visits.  She had good progress but during a very busy time from Thanksgiving to [place] Years she started having pain and some regression of motion.  She has had x-rays, bone scan and a CT.  She saw  [name] after the CT.  Feels like it could be an infection, PT vs a revision.   11/30/23 Patient reports that she saw the surgeon she is frustrated and upset as she feels that she still has no answers, it is felt that there is no loosening and no infection.  She is still hurting with basic ADL's, her AROM decreased beginning in March, as she started having increased pain in mid January after a very busy and active few month, she has an order for the left shoulder and the okay for us  to see her for her right shoulder for another month PAIN:  Are you having pain? Yes: NPRS scale: 7/10 Pain location: right shoulder, HA, left shoulder pain up to 8/10 with motions Pain description: dull ache at rest, sharp with motions Aggravating factors: quick motions, movements pain up to 10/10 Relieving factors: ice, rest, Tylenol  at best 3/10  PRECAUTIONS: none  RED FLAGS: None   WEIGHT BEARING RESTRICTIONS: No  FALLS:  Has patient fallen in last 6 months? no  LIVING ENVIRONMENT: Lives with: lives alone Lives in: House/apartment Stairs: No Has following equipment at home: None  OCCUPATION: retired  PLOF: Independent, very active with grandkids, does 5 classes a week [employer]  PATIENT GOALS:dress without difficulty, do hair, have good ROM and less pain  NEXT MD VISIT: Mid November  OBJECTIVE:   DIAGNOSTIC FINDINGS:  See above  PATIENT SURVEYS:    COGNITION: Overall cognitive status: Within functional limits for tasks assessed     SENSATION: WFL  POSTURE: Fwd head, rounded shoulders, elevated and guarded shoulder  UPPER EXTREMITY ROM:   Active ROM Right AROM  eval Left AROM Eval Right AROM 02/22/24 Right  AROM 03/20/24                  AROM 11/30/23  Shoulder flexion 105 125 112 118                  R 120  Shoulder extension                        Shoulder abduction 85 100 90 93                  Right 100  Shoulder adduction                        Shoulder internal  rotation 0 30  10                  Right 28  Shoulder external rotation 46 65  j                    Right 60  Elbow flexion                        Elbow extension                        Wrist flexion                        Wrist extension                        Wrist ulnar deviation                        Wrist radial deviation                        Wrist pronation                        Wrist supination                        (Blank rows = not tested)  UPPER EXTREMITY MMT:  In the available ROM  MMT Right eval Left eval  Shoulder flexion 4- 4-  Shoulder extension    Shoulder abduction    Shoulder adduction    Shoulder internal rotation 4+ 4+  Shoulder external rotation 4-P! 4  Middle trapezius    Lower trapezius    Elbow flexion    Elbow extension    Wrist flexion    Wrist extension    Wrist ulnar deviation    Wrist radial deviation    Wrist pronation    Wrist supination    Grip strength (lbs)    (Blank rows = not tested)  PALPATION:  Very tight and tender in the pectoral, upper trap, the entire right upper arm, slight warmth in the right anterior lateral arm  Positive impingement on the right   TODAY'S TREATMENT:                                                                                                                                          DATE:  04/16/24 Passive stretch LE's STM to LE's STM to the right upper trap, neck and rhomboid Passive stretch to the right shoulder Vaso right shoulder  04/11/24 Passive LE stretches STM to the LE's Passive ROM bilateral UE's STM to the right upper trap, neck, rhomboid and upper arm Right vaso 34 degrees  04/10/24 Passive stretch bilateral UE's Passive stretch LE's STM to the upper traps, neck and the bilateral UE's very tender today  04/04/24 Passive stretch of the LE's  Passive stretch  of the UE's STM to the LE's STM to the upper traps and rhomboids Vaso to the left shoulder  04/03/24 Nustep level 4 x 5 minutes Passive stretch shoulders bilaterally STM to the neck, upper traps and rhomboids Vaso 34 degrees right shoulder  03/26/24 UBE L 2 each way Passive stretch bilateral UE's STM to the upper traps, neck and rhomboids, some MFR to knots in the upper traps Vaso 34 degrees  03/20/24 UBE level 5 x 6 minutes Passive stretch bilateral UE's, passive stretch LE's STM to the upper traps, neck and rhomboids, some MFR to knots in the upper traps Vaso 34 degrees  03/19/24 UBE level 4 x 6 minutes 20# row 20# lats 5# straight arm pulls 5# chest press 3# serratus and isometric circles 2# wate bar OHP 2# wate bar extensions with PT assist Passive stretch LE's and the right shoulder all motions Vaso low pressure 34 degrees right shoulder  03/14/24 UBE level 4 x 6 minutes Rows 20# Lats 20# 2# wall slides and circles Passive stretch bilateral shoulders 3# wate bar chest press 3# wate bar flexion 5# small chest press, serratus press and isometric circles PNF supine bilateral shoulders Vaso right shoulder 34 degrees  03/12/24 Rows 20# 2x10 Lats 20# 2x10 Chest press 5# 2x10 UBE level 4 x 6 minutes 3# serratus 3# isometric  circles 2# ER/IR Passive strength right shoulder all motions, joint distraction Vaso 34 degrees low pressure right shoulder  03/07/24 Nustep level 4 x 7 minutes 5# straight arm pulls Ball vs wall 4 positions 15# rows 15# lats Supine 3# isometric circles bilaterally 3# serratus bilaterally Passive stretches bilaterally UE's Vaso medium pressure 34 degrees  PATIENT EDUCATION: Education details: poc Person educated: Patient Education method: Programmer, Multimedia, Facilities Manager, Actor cues, Verbal cues, and Handouts Education comprehension: verbalized understanding  HOME EXERCISE PROGRAM:  ASSESSMENT:  CLINICAL IMPRESSION:  Patient continues to report feeling run down and She is having spasms and pain in her upper legs.  She is very tight and very tender in the LE mms and the upper arms and upper traps.  We may switch to trying to help with the left shoulder pain, strength and function.  She reports that she will be seeing her MD tomorrow.   Patient reports that she is doing okay, she reports that she is trying to back off of some activities and break things down over time to ease the stress and strain thinks this is helping as she is making soup for the community and is doing this over a few days instead of all at one time..She has a lot of knots in the upper traps, neck and rhomboids, her ROM is improving.  She is having left shoulder pain as well due to it compensating so much, she has worries about this and needing the left shoulder to be strong so when she gets the right TSA she can have a good shoulder  Evaluation:  Patient is very frustrated, she had the right reverse TSA last July.  She did very well until around Christmas, then she really started having increased pain and lost some ROM.  She has had blood work done, US , bone scan and CT performed.  There has been some talk of a possible infection, MD feels like she may benefit from a revision.  She would like to try PT again and the MD  agreed.  She has lost ROM since I last saw her, she also is having some significant left shoulder pain from over  using it.   July 2025: Patient saw surgeon, the bone scan said did not feel that there was infection or loosening however the surgeon is not so sure, he wants to do an US  to be sure, that is scheduled for the next few weeks, since she has not been to PT in a month I re-measured her ROM, she has lost at least 15 degrees of motion and more for all motions.  She is also having more pain in the left shoulder possibly from overdoing it, because she is trying to not use the right shoulder.    OBJECTIVE IMPAIRMENTS: cardiopulmonary status limiting activity, decreased activity tolerance, decreased endurance, decreased ROM, decreased strength, increased edema, increased muscle spasms, impaired flexibility, impaired UE functional use, improper body mechanics, postural dysfunction, and pain.  REHAB POTENTIAL: Good  CLINICAL DECISION MAKING: Evolving/moderate complexity  EVALUATION COMPLEXITY: Low   GOALS: Goals reviewed with patient? Yes  SHORT TERM GOALS: Target date: 01/31/24  Independent with initial HEP Goal status: met 01/31/24  LONG TERM GOALS: Target date: 04/15/24  Decrease pain 50% Goal status: progressing 03/20/24 and 03/26/24, 04/11/24 2.  Dress without difficulty Goal status: progressing 03/20/24 and 03/26/24, 04/11/24 3.  Do hair without difficulty Goal status: progressing 03/14/24 and 03/26/24 4.  Increase AROM right shoulder flexion to 130 degrees Goal status:  5.  Increase right shoulder ER to 60 degrees Goal status: progressing 03/14/24, 04/11/24  6.  Return to water  aerobics and or gym activity Goal status: on going 02/26/24  and 03/26/24  PLAN:  PT FREQUENCY: 1-2x/week  PT DURATION: 12 weeks  PLANNED INTERVENTIONS: Therapeutic exercises, Therapeutic activity, Neuromuscular re-education, Balance training, Gait training, Patient/Family education, Self Care,  Joint mobilization, Dry Needling, Electrical stimulation, Cryotherapy, Vasopneumatic device, and Manual therapy  PLAN FOR NEXT SESSION: Try to regain some of her ROM and function, treat pain as needed, address the left shoulder as well  04/16/2024, 10:12 AM [facility] at [facility]. [place], [place], [address] Phone: [phone]   Fax:  [phone]

## 2024-04-17 ENCOUNTER — Ambulatory Visit: Admitting: Family Medicine

## 2024-04-17 ENCOUNTER — Other Ambulatory Visit: Payer: Self-pay

## 2024-04-17 ENCOUNTER — Encounter: Payer: Self-pay | Admitting: Family Medicine

## 2024-04-17 VITALS — BP 140/59 | HR 77 | Temp 97.8°F | Ht 61.0 in | Wt 194.6 lb

## 2024-04-17 DIAGNOSIS — J0140 Acute pansinusitis, unspecified: Secondary | ICD-10-CM

## 2024-04-17 DIAGNOSIS — I10 Essential (primary) hypertension: Secondary | ICD-10-CM | POA: Diagnosis not present

## 2024-04-17 DIAGNOSIS — E119 Type 2 diabetes mellitus without complications: Secondary | ICD-10-CM | POA: Diagnosis not present

## 2024-04-17 DIAGNOSIS — Z7985 Long-term (current) use of injectable non-insulin antidiabetic drugs: Secondary | ICD-10-CM | POA: Diagnosis not present

## 2024-04-17 DIAGNOSIS — Z7984 Long term (current) use of oral hypoglycemic drugs: Secondary | ICD-10-CM | POA: Diagnosis not present

## 2024-04-17 LAB — COMPREHENSIVE METABOLIC PANEL WITH GFR
ALT: 35 U/L (ref 3–35)
AST: 27 U/L (ref 5–37)
Albumin: 4.3 g/dL (ref 3.5–5.2)
Alkaline Phosphatase: 52 U/L (ref 39–117)
BUN: 7 mg/dL (ref 6–23)
CO2: 28 meq/L (ref 19–32)
Calcium: 9.5 mg/dL (ref 8.4–10.5)
Chloride: 103 meq/L (ref 96–112)
Creatinine, Ser: 0.47 mg/dL (ref 0.40–1.20)
GFR: 97.18 mL/min (ref >60.00)
Glucose, Bld: 106 mg/dL — ABNORMAL HIGH (ref 70–99)
Potassium: 5 meq/L (ref 3.5–5.1)
Sodium: 140 meq/L (ref 135–145)
Total Bilirubin: 0.5 mg/dL (ref 0.2–1.2)
Total Protein: 6.6 g/dL (ref 6.0–8.3)

## 2024-04-17 LAB — HEMOGLOBIN A1C: Hgb A1c MFr Bld: 7.1 % — ABNORMAL HIGH (ref 4.6–6.5)

## 2024-04-17 LAB — MICROALBUMIN / CREATININE URINE RATIO
Creatinine,U: 64.7 mg/dL
Microalb Creat Ratio: UNDETERMINED mg/g (ref 0.0–30.0)
Microalb, Ur: <0.7 mg/dL

## 2024-04-17 MED ORDER — FLUTICASONE PROPIONATE 50 MCG/ACT NA SUSP: 2.0000 | Freq: Every day | NASAL | 6 refills | Status: AC

## 2024-04-17 MED ORDER — AMOXICILLIN-POT CLAVULANATE 875-125 MG PO TABS: 1.0000 | ORAL_TABLET | Freq: Two times a day (BID) | ORAL | 0 refills | Status: AC

## 2024-04-17 NOTE — Assessment & Plan Note (Signed)
 Blood pressure is at goal for age and co-morbidities.   Recommendations: lisinopril  5 mg daily - BP goal <130/80 - monitor and log blood pressures at home - check around the same time each day in a relaxed setting - Limit salt to <2000 mg/day - Follow DASH eating plan (heart healthy diet) - limit alcohol to 2 standard drinks per day for men and 1 per day for women - avoid tobacco products - get at least 2 hours of regular aerobic exercise weekly Patient aware of signs/symptoms requiring further/urgent evaluation.

## 2024-04-17 NOTE — Assessment & Plan Note (Signed)
-   Continue Ozempic  2 mg weekly. - Continue Synjardy  1 tablet twice daily. - Recheck HbA1c - Monitor blood sugar levels regularly.

## 2024-04-17 NOTE — Progress Notes (Signed)
 Established Patient Office Visit  Subjective:  Patient ID: Veronica Galvan, female    DOB: [birth year]  Age: 68 y.o. MRN: [id number]  CC:  Chief Complaint  Patient presents with   Diabetes    Follow up   Sinus Problem    Follow up   Medication Refill    Rx refill  for Flonase       HPI Veronica Galvan is here for diabetes follow-up.   Discussed the use of AI scribe software for clinical note transcription with the patient, who gave verbal consent to proceed.  History of Present Illness Veronica Galvan is a 68 year old female with chronic sinus issues and diabetes who presents with persistent cough and sinus congestion.  She has been experiencing persistent sinus congestion and cough, which have shown partial improvement following a course of azithromycin  (Z-Pak) and prednisone . The Z-Pak was started approximately ten days ago, and she completed a five-day course of prednisone . Despite these treatments, she continues to have a cough, particularly in the morning and night, and is still producing yellow nasal discharge. Her left ear is also painful.  She has type 2 diabetes and is currently on Ozempic  2 mg and Synjardy  12.08/998 mg. She has not been monitoring her blood sugar levels recently due to feeling unwell and acknowledges not eating properly. Her last A1c was 7.3 in August, up from 7.0 in April. She has lost some weight but has not been exercising.     Diabetes: - Checking glucose at home: not - Medications: Ozempic  2 mg weekly and Synjardy  12.08-998 twice daily.  - Compliance: good - Diet: general  - Exercise: minimal  - Eye exam: UTD - Foot exam: UTD - Microalbumin: ordered - Denies symptoms of hypoglycemia, polyuria, polydipsia, numbness extremities, foot ulcers/trauma, wounds that are not healing, medication side effects  Lab Results  Component Value Date   HGBA1C 7.3 (H) 12/19/2023   HGBA1C 7.0 (H) 08/22/2023   HGBA1C 6.9 (H) 05/19/2023   Wt  Readings from Last 3 Encounters:  04/17/24 194 lb 9.6 oz (88.3 kg)  04/08/24 193 lb 12.8 oz (87.9 kg)  02/15/24 197 lb (89.4 kg)     Hypertension: - Medications: Lisinopril  5 mg daily. - Compliance: good - Checking BP at home:  - Denies any SOB, recurrent headaches, CP, vision changes, LE edema, dizziness, palpitations, or medication side effects. BP Readings from Last 3 Encounters:  04/17/24 (!) 140/59  04/08/24 (!) 123/40  02/15/24 138/74       Past Medical History:  Diagnosis Date   Allergy     Anxiety    Arthritis    hands   Cataract    Diabetes mellitus without complication (HCC)    type 2   Family history of adverse reaction to anesthesia    son has malignant hyperthermia, daughter does not daughter recently had c section without problems   GERD (gastroesophageal reflux disease)    Headache    sinus   Hyperlipidemia    Hypertension    PONV (postoperative nausea and vomiting)    nausea only   Ulcer, stomach peptic yrs ago    Past Surgical History:  Procedure Laterality Date   APPENDECTOMY     both hells bone spur repair     both heels with metal clips   both shoulder rotator cuff repair     CESAREAN SECTION     x 1   COLONOSCOPY WITH PROPOFOL  N/A 09/07/2016   Procedure: COLONOSCOPY WITH PROPOFOL ;  Surgeon: Vicci Gladis POUR, MD;  Location: [facility];  Service: Endoscopy;  Laterality: N/A;   colonscopy  06/2011   polyps   ELBOW FRACTURE SURGERY Left    EYE SURGERY     FRACTURE SURGERY     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/10/2022   Procedure: REVERSE SHOULDER ARTHROPLASTY;  Surgeon: Melita Drivers, MD;  Location: [facility];  Service: Orthopedics;  Laterality: Right;  Please follow in room 6 if able   TUBAL LIGATION     VESICO-VAGINAL FISTULA REPAIR      Family History  Problem Relation Age of Onset   Hypertension Mother    COPD Mother    Cancer Mother    Arthritis Mother    Hypertension Father    Hyperlipidemia Father    Diabetes  Father    Cancer Father    Alcohol abuse Father    Diabetes Brother    Alcohol abuse Brother    Hypertension Brother    Arthritis Maternal Grandmother    Birth defects Maternal Grandmother    Diabetes Paternal Grandmother    Heart disease Paternal Grandfather    Diabetes Paternal Aunt    Diabetes Paternal Aunt    Obesity Son     Social History   Socioeconomic History   Marital status: Widowed    Spouse name: Not on file   Number of children: 2   Years of education: Not on file   Highest education level: Bachelor's degree (e.g., BA, AB, BS)  Occupational History   Not on file  Tobacco Use   Smoking status: Former    Current packs/day: 1.00    Average packs/day: 1 pack/day for 14.0 years (14.0 ttl pk-yrs)    Types: Cigarettes   Smokeless tobacco: Never   Tobacco comments:    quit 31 yrs ago  Substance and Sexual Activity   Alcohol use: Yes    Comment: rare   Drug use: No   Sexual activity: Not Currently    Birth control/protection: Post-menopausal  Other Topics Concern   Not on file  Social History Narrative   Right handed   Caffeine intake none   Social Drivers of Health   Tobacco Use: Medium Risk (04/17/2024)   Patient History    Smoking Tobacco Use: Former    Smokeless Tobacco Use: Never    Passive Exposure: Not on file  Financial Resource Strain: Low Risk (04/08/2024)   Overall Financial Resource Strain (CARDIA)    Difficulty of Paying Living Expenses: Not hard at all  Food Insecurity: No Food Insecurity (04/08/2024)   Epic    Worried About Radiation Protection Practitioner of Food in the Last Year: Never true    Ran Out of Food in the Last Year: Never true  Transportation Needs: No Transportation Needs (04/08/2024)   Epic    Lack of Transportation (Medical): No    Lack of Transportation (Non-Medical): No  Physical Activity: Sufficiently Active (04/08/2024)   Exercise Vital Sign    Days of Exercise per Week: 5 days    Minutes of Exercise per Session: 40 min  Stress: No  Stress Concern Present (04/08/2024)   [employer] of Occupational Health - Occupational Stress Questionnaire    Feeling of Stress: Only a little  Social Connections: Moderately Integrated (04/08/2024)   Social Connection and Isolation Panel    Frequency of Communication with Friends and Family: More than three times a week    Frequency of Social Gatherings with Friends and Family: More than three times a week  Attends Religious Services: More than 4 times per year    Active Member of Clubs or Organizations: Yes    Attends Banker Meetings: More than 4 times per year    Marital Status: Widowed  Intimate Partner Violence: Not At Risk (09/13/2023)   Humiliation, Afraid, Rape, and Kick questionnaire    Fear of Current or Ex-Partner: No    Emotionally Abused: No    Physically Abused: No    Sexually Abused: No  Depression (PHQ2-9): Low Risk (04/08/2024)   Depression (PHQ2-9)    PHQ-2 Score: 0  Alcohol Screen: Low Risk (04/08/2024)   Alcohol Screen    Last Alcohol Screening Score (AUDIT): 1  Housing: Unknown (04/08/2024)   Epic    Unable to Pay for Housing in the Last Year: No    Number of Times Moved in the Last Year: Not on file    Homeless in the Last Year: No  Utilities: Not At Risk (09/13/2023)   [employer] Utilities    Threatened with loss of utilities: No  Health Literacy: Not on file    ROS All ROS negative except what is listed in the HPI.   Objective:   Today's Vitals: BP (!) 140/59   Pulse 77   Temp 97.8 F (36.6 C) (Oral)   Ht 5' 1 (1.549 m)   Wt 194 lb 9.6 oz (88.3 kg)   SpO2 100%   BMI 36.77 kg/m   Physical Exam Vitals reviewed.  Constitutional:      Appearance: Normal appearance.  HENT:     Head: Normocephalic and atraumatic.     Left Ear: A middle ear effusion is present.  Cardiovascular:     Rate and Rhythm: Normal rate and regular rhythm.     Heart sounds: Normal heart sounds.  Pulmonary:     Effort: Pulmonary effort is normal.      Breath sounds: Normal breath sounds. No wheezing, rhonchi or rales.  Skin:    General: Skin is warm and dry.  Neurological:     Mental Status: She is alert and oriented to person, place, and time.  Psychiatric:        Mood and Affect: Mood normal.        Behavior: Behavior normal.        Thought Content: Thought content normal.        Judgment: Judgment normal.        Assessment & Plan:   Problem List Items Addressed This Visit       Active Problems   Type II diabetes mellitus (HCC) - Primary   - Continue Ozempic  2 mg weekly. - Continue Synjardy  1 tablet twice daily. - Recheck HbA1c - Monitor blood sugar levels regularly.      Relevant Orders   Hemoglobin A1c   Comprehensive metabolic panel with GFR   Microalbumin / creatinine urine ratio   Primary hypertension   Blood pressure is at goal for age and co-morbidities.   Recommendations: lisinopril  5 mg daily - BP goal <130/80 - monitor and log blood pressures at home - check around the same time each day in a relaxed setting - Limit salt to <2000 mg/day - Follow DASH eating plan (heart healthy diet) - limit alcohol to 2 standard drinks per day for men and 1 per day for women - avoid tobacco products - get at least 2 hours of regular aerobic exercise weekly Patient aware of signs/symptoms requiring further/urgent evaluation.        Relevant Orders  Comprehensive metabolic panel with GFR   Other Visit Diagnoses       Acute pansinusitis, recurrence not specified   Continue symptoms despite Zpak and prednisone .  - Prescribed Augmentin  for one week, twice daily, with food and water . - Advised taking probiotics at least two hours away from antibiotics. - Continue Flonase . Can try Afrin for three days. - Encouraged pulmonary toileting techniques.     Relevant Medications   amoxicillin -clavulanate (AUGMENTIN ) 875-125 MG tablet        Follow-up: Return in about 3 months (around 07/16/2024) for chronic disease  management.   [name] FURY Almarie, DNP, [name]-C  I,Emily Lagle,acting as a neurosurgeon for [name] KATHEE Almarie, NP.,have documented all relevant documentation on the behalf of [name] KATHEE Almarie, NP.  I, [name] KATHEE Almarie, NP, have reviewed all documentation for this visit. The documentation on 04/17/2024 for the exam, diagnosis, procedures, and orders are all accurate and complete.

## 2024-04-17 NOTE — Therapy (Signed)
 OUTPATIENT OCCUPATIONAL THERAPY ORTHO  treatment  Patient Name: Veronica Galvan MRN: [id number] DOB:09/05/1955, 68 y.o., female Today's Date: 04/17/2024  PCP: Almarie Birmingham, NP REFERRING PROVIDER: Almarie Birmingham, NP  END OF SESSION:  OT End of Session - 04/17/24 1225     Visit Number 8    Number of Visits 12    Date for Recertification  05/15/24    Authorization Type [employer] MCR    Authorization Time Period 12 weeks    Authorization - Visit Number 8    Progress Note Due on Visit 10    OT Start Time 0931    OT Stop Time 1006    OT Time Calculation (min) 35 min    Activity Tolerance Patient tolerated treatment well    Behavior During Therapy WFL for tasks assessed/performed               Past Medical History:  Diagnosis Date   Allergy     Anxiety    Arthritis    hands   Cataract    Diabetes mellitus without complication (HCC)    type 2   Family history of adverse reaction to anesthesia    son has malignant hyperthermia, daughter does not daughter recently had c section without problems   GERD (gastroesophageal reflux disease)    Headache    sinus   Hyperlipidemia    Hypertension    PONV (postoperative nausea and vomiting)    nausea only   Ulcer, stomach peptic yrs ago   Past Surgical History:  Procedure Laterality Date   APPENDECTOMY     both hells bone spur repair     both heels with metal clips   both shoulder rotator cuff repair     CESAREAN SECTION     x 1   COLONOSCOPY WITH PROPOFOL  N/A 09/07/2016   Procedure: COLONOSCOPY WITH PROPOFOL ;  Surgeon: Vicci Gladis POUR, MD;  Location: [facility];  Service: Endoscopy;  Laterality: N/A;   colonscopy  06/2011   polyps   ELBOW FRACTURE SURGERY Left    EYE SURGERY     FRACTURE SURGERY     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/10/2022   Procedure: REVERSE SHOULDER ARTHROPLASTY;  Surgeon: Melita Drivers, MD;  Location: [facility];  Service: Orthopedics;  Laterality: Right;  Please follow in room 6 if able    TUBAL LIGATION     VESICO-VAGINAL FISTULA REPAIR     Patient Active Problem List   Diagnosis Date Noted   Aortic atherosclerosis 02/07/2024   Chronic right shoulder pain 02/07/2024   Gastroesophageal reflux disease 11/16/2021   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 08/18/2021   Dermatofibroma 08/18/2021   History of malignant neoplasm of skin 08/18/2021   Lentigo 08/18/2021   Melanocytic nevi of trunk 08/18/2021   Nevus lipomatosus cutaneous superficialis 08/18/2021   Rosacea 08/18/2021   Actinic keratosis 08/18/2021   Sensorineural hearing loss (SNHL) of left ear with unrestricted hearing of right ear 07/26/2021   Tinnitus of left ear 07/26/2021   Acute recurrent maxillary sinusitis 06/22/2021   Primary hypertension 01/12/2021   Hyperlipidemia associated with type 2 diabetes mellitus (HCC) 01/12/2021   Arthritis 01/12/2021   Type II diabetes mellitus (HCC) 11/25/2019   Ingrown toenail 09/05/2018   Neck pain 01/29/2018   Tick bite 10/24/2017    ONSET DATE: 02/07/24  REFERRING DIAG: M19.90 (ICD-10-CM) - Arthritis      THERAPY DIAG:  Muscle weakness (generalized)  Stiffness of right shoulder, not elsewhere classified  Chronic right  shoulder pain  Pain in right hand  Pain in left hand  Other lack of coordination  Stiffness of left hand, not elsewhere classified  Stiffness of right hand, not elsewhere classified  Rationale for Evaluation and Treatment: Rehabilitation  SUBJECTIVE:   SUBJECTIVE STATEMENT: Pt reports her hands are painful since she has not been using due to illness. 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 Pt was previously seen for OT and d/c 12/06/23  PRECAUTIONS: hx of R reverse toal shoulder, none for hands    WEIGHT BEARING RESTRICTIONS: No  PAIN:  Are you having pain? Yes:  NPRS scale: R hand 6/10, L hand 6/10 Pain location: bilateral hands Pain description: aching Aggravating factors: overuse, waking up in pain Relieving factors: paraffin Pt has R shoulder pain 6-8/10, PT is addressing, OT will not address., FALLS: Has patient fallen in last 6 months? No  LIVING ENVIRONMENT: Lives with: lives alone Lives in: House/apartment   PLOF: Independent  PATIENT GOALS: decrease pain in hands, improve function/ mobility  NEXT MD VISIT: unknown  OBJECTIVE:  Note: Objective measures were completed at Evaluation unless otherwise noted.  HAND DOMINANCE: Right  ADLs:Pt reports increased overall difficulty with ADLs due to pain and stiffness. Pt is mod I with basic ADLs. She has difficulty with buttons, styling hair, opening jars.    FUNCTIONAL OUTCOME MEASURES: Upper Extremity Functional Scale (UEFS): 33/80  UPPER EXTREMITY ROM:   RUE composite finger flexion 70% (3cm from palm at middle finger)     LUE composite finer flexion 60% (3.5cm from palm at middle finger) bilateral UE's grossly 90-95% composite finger extension     HAND FUNCTION: Grip strength: Right: 18 lbs; Left: 15 lbs, Lateral pinch: Right: 10 lbs, Left: 12 lbs, and Tip pinch: Right 4 lbs, Left: 2 lbs  COORDINATION:NT   SENSATION: WFL  EDEMA: Pt with bony defomities at PIP joints of all digits which pt reports are bone spurs, Pt also has bony deformity at DIP for right thumb, index and 5th digit and LUE small and index fingers   COGNITION: Overall cognitive status: Within functional limits for tasks assessed   OBSERVATIONS: Pleasant female well known to therapist from previous occupational therapy at this site   TREATMENT DATE: 04/16/24-Paraffin to right hand for pain  and stiffness 8 mins, no adverse reactions while therapist performed US  to left hand and digits 3.3 mhz, 0.8 w/cm 2, 20% x 8 mins no adverse reactions. Paraffin to left hand for pain/ stiffness x 8 mins, no adverse  reactions while therapist performed US  to right hand and digits 3.3 mhz, 0.8 w/cm 2, 20% x 8 mins no adverse reactions. Soft tissue/ joint mobs to hands, fingers  and wrists, forarms as well as P/ROM composite and individual finger flexion to digits. Pt performed grip strengthening with resistive tree for bilateral hands. Pt reports hands were looser at end of session    12/10/25Paraffin to right hand for pain  and stiffness 8 mins, no adverse reactions while therapist performed US  to left hand and digits 3.3 mhz, 0.8 w/cm 2, 20% x 8 mins no adverse reactions. Paraffin to left hand for pain/ stiffness x 8 mins, no adverse reactions while therapist performed US  to right hand and digits 3.3 mhz, 0.8 w/cm 2, 20% x 8 mins no adverse reactions. Soft tissue/ joint mobs to hands, fingers  and wrists as well  as P/ROM composite and individual finger flexion to digits. Therapist checked pinch strength and ROM at end of session. Pt met pinch strength goals.  12/3/25paraffin to right hand for pain management x 8 mins, no adverse reactions while therapist performed US  to left hand and digits 3.3 mhz, 0.8 w/cm 2, 20% x 8 mins no adverse reactions. Paraffin to left hand for pain management x 8 mins, no adverse reactions while therapist performed US  to right hand and digits 3.3 mhz, 0.8 w/cm 2, 20% x 8 mins no adverse reactions. Soft tissue/ joint mobs to hands and wrists as well as P/ROM comosite finger flexion to digits.  11/18/25Paraffin to right hand for pain management x 8 mins, no adverse reactions while therapist performed US  to left hand and digits 3.3 mhz, 0.8 w/cm 2, 20% x 8 mins no adverse reactions. Paraffin to left hand for pain management x 8 mins, no adverse reactions while therapist performed US  to right hand and digits 3.3 mhz, 0.8 w/cm 2, 20% x 8 mins no adverse reactions. Pt reports seeking assistance for cooking soup for church and receiving a padddle at therapist recommendation for joint  protection when stirring. Therpist recommends pt coniders delgating the soup task to others in the future or that she seeks additional assist. araffin to right hand for pain management x 8 mins, no adverse reactions while therapist performed US  to left hand and digits 3.3 mhz, 0.8 w/cm 2, 20% x 8 mins no adverse reactions. Paraffin to left hand for pain management x 8 mins, no adverse reactions while therapist performed US  to right hand and digits 3.3 mhz, 0.8 w/cm 2, 20% x 8 mins no adverse reactions. with lotion. P/ROM to digits in flexion individually and compositely. A/ROM finger thumb opposition as able to bilateral hands  11/12/24Paraffin to right hand for pain management x 8 mins, no adverse reactions while therapist performed US  to left hand and digits 3.3 mhz, 0.8 w/cm 2, 20% x 8 mins no adverse reactions. Paraffin to left hand for pain management x 8 mins, no adverse reactions while therapist performed US  to right hand and digits 3.3 mhz, 0.8 w/cm 2, 20% x 8 mins no adverse reactions. Pt reports making changes to her baking schedule and ordering a padddle at therapist recommendation for joint protection. Soft tissue and joint mobs to bilateral hands, wrist and forearms. P/ROM to digits in flexion individually and compositely. Pt demonstrates improved flexibility end of session.       03/05/24 Paraffin to right hand for pain management x 8 mins, no adverse reactions while therapist performed US  to left hand and digits 3.3 mhz, 0.8 w/cm 2, 20% x 8 mins no adverse reactions. Paraffin to left hand for pain management x 8 mins, no adverse reactions while therapist performed US  to right hand and digits 3.3 mhz, 0.8 w/cm 2, 20% x 8 mins no adverse reactions.P/ROM to digits in flexion individually and compositely.discussed options for self care with pt's upcoming cooking activities: ie use of paddle, and suggestions for when pt has shoulder sugery.   Soft tissue and joint mobs to bilateral hands,  wrist and forearms. Grip strengthening with stress tree for resisted grip  02/26/24 Paraffin to right hand for pain management x 8 mins, no adverse reactions while therapist perfromed US  to left hand and digits 3.3 mhz, 0.8 w/cm 2, 20% x 8 mins no adverse reactions. Paraffin to left hand for pain management x 8 mins, no adverse reactions while therapist performed US  to right  hand and digits 3.3 mhz, 0.8 w/cm 2, 20% x 8 mins no adverse reactions.P/ROM to digits in flexion  individually and compositely.  Soft tissue and joint mobs to bilateral hands, wrist and forearms.  Discussed improtance of balancing activity and rest in order to management pain and prevent injury.  02/21/24- eval Paraffin to bilateral hands and wrists for pain management x 8 mins, no adverse reactions. P/ROM to digits in flexion  individually and compositely soft tissue mobs to bilateral hands. Pt reports decreased pain end of session.      PATIENT EDUCATION: Education details: see above Person educated: Patient Education method: Explanation Education comprehension: verbalized understanding  HOME EXERCISE PROGRAM: n/a  GOALS: Goals reviewed with patient? Yes  SHORT TERM GOALS: Target date: 03/23/24  I with inital HEP.  Goal status:  ongoing 03/13/24  2.    Pt will improve RUE tip pinch by 2 lbs(from baseline) for increased ease with daily activities. Revised goal:Pt will improve RUE tip pinch to 7 lbs for increased ease with daily activities.                               baseline: RUE tip 4lbs, LUE tip 2 lbs                          Goal status: met 6 lbs 04/10/24, continue with revised goal  3.  Pt will improve LUE tip pinch by 2 lbs(from baseline) for increased ease with daily activities. Revised goal:Pt will improve LUE tip pinch to 5 lbs for increased ease with daily activities. baseline: RUE tip 4lbs, LUE tip 2 lbs Goal status:  met 4lbs 04/10/24, continue with revised goal  4.  I with adapted  strategies/ adapted equipment to minimize pain and to increase pt I with ADLs/IADLs   Goal status: ongoing, pt has ordered a paddle to use for stirring soup, 03/19/24  5.  Pt will report bilateral hand pain no greater than 4/10 for ADLs/IADLS Baseline: RUE 7/10, LUE 6/10 Goal status: ongoing 03/13/24- pain continues to improve 4/10 today 03/19/24, 6/10 today  -04/10/24      LONG TERM GOALS: Target date: 05/15/24      Pt will improve RUE grip strength to 22 # or greater for increased functional use Baseline: RUE 18# Goal status:  ongoing 04/10/24  2.  Pt will improve LUE grip strength to 25 # or greater for increased functional use Baseline: LUE 21# Goal status:  ongoing 04/10/24   3.Pt will demonstrate improved composite finger flexion for ADLs as evidenced by pt. bringing middle fingertip for RUE within 2.75 cm of palm.             Baseline: 3 cm from palm to middle fingertip             Goal status: met today,  pt demonstrates 2 cm from palm today, met 04/10/24- will monitor for consistency   4. .Pt will demonstrate improved composite finger flexion for ADLs as evidenced by pt. bringing middle fingertip for LUE within 3.25 of palm.             Baseline: 3.5 cm from palm to middle fingertip             Goal status: met today, pt demonstrates 2 cm from palm today, met 04/10/24- will monitor for consistency    5. check 9 hole peg test and  set goals prn- deferred 04/10/24 ASSESSMENT:  CLINICAL IMPRESSION: Patient is progressing towards goals.Pt reports ROM wasmproved at end of session. Pt demonstrates improving bilateral functional use.PERFORMANCE DEFICITS: in functional skills including ADLs, IADLs, coordination, dexterity, proprioception, sensation, edema, ROM, strength, pain, flexibility, Fine motor control, Gross motor control, endurance, decreased knowledge of precautions, decreased knowledge of use of DME, and UE functional use,and psychosocial skills including coping  strategies, environmental adaptation, habits, interpersonal interactions, and routines and behaviors.   IMPAIRMENTS: are limiting patient from ADLs, IADLs, rest and sleep, play, leisure, and social participation.   COMORBIDITIES: may have co-morbidities  that affects occupational performance. Patient will benefit from skilled OT to address above impairments and improve overall function.  MODIFICATION OR ASSISTANCE TO COMPLETE EVALUATION: No modification of tasks or assist necessary to complete an evaluation.  OT OCCUPATIONAL PROFILE AND HISTORY: Detailed assessment: Review of records and additional review of physical, cognitive, psychosocial history related to current functional performance.  CLINICAL DECISION MAKING: LOW - limited treatment options, no task modification necessary  REHAB POTENTIAL: Good  EVALUATION COMPLEXITY: Low      PLAN:  OT FREQUENCY: 1-2x/week  OT DURATION: 12 weeks  PLANNED INTERVENTIONS: 97168 OT Re-evaluation, 97535 self care/ADL training, 02889 therapeutic exercise, 97530 therapeutic activity, 97112 neuromuscular re-education, 97140 manual therapy, 97035 ultrasound, 97018 paraffin, 02989 moist heat, 97010 cryotherapy, 97014 electrical stimulation unattended, 97760 Orthotic Initial, 97763 Orthotic/Prosthetic subsequent, scar mobilization, passive range of motion, functional mobility training, energy conservation, coping strategies training, patient/family education, and DME and/or AE instructions  RECOMMENDED OTHER SERVICES: n/a  CONSULTED AND AGREED WITH PLAN OF CARE: Patient  PLAN FOR NEXT SESSION:,  paraffin, US , gentle strengthening   Ayush Boulet, OT 04/17/2024, 12:29 PM

## 2024-04-18 ENCOUNTER — Ambulatory Visit: Payer: Self-pay | Admitting: Family Medicine

## 2024-04-18 ENCOUNTER — Encounter: Payer: Self-pay | Admitting: Physical Therapy

## 2024-04-18 ENCOUNTER — Ambulatory Visit: Admitting: Physical Therapy

## 2024-04-18 DIAGNOSIS — M25611 Stiffness of right shoulder, not elsewhere classified: Secondary | ICD-10-CM

## 2024-04-18 DIAGNOSIS — M542 Cervicalgia: Secondary | ICD-10-CM

## 2024-04-18 DIAGNOSIS — G8929 Other chronic pain: Secondary | ICD-10-CM

## 2024-04-18 DIAGNOSIS — M6281 Muscle weakness (generalized): Secondary | ICD-10-CM

## 2024-04-18 DIAGNOSIS — R252 Cramp and spasm: Secondary | ICD-10-CM

## 2024-04-18 DIAGNOSIS — M25512 Pain in left shoulder: Secondary | ICD-10-CM

## 2024-04-18 NOTE — Therapy (Unsigned)
 OUTPATIENT OCCUPATIONAL THERAPY ORTHO  treatment  Patient Name: Veronica Galvan MRN: [id number] DOB:[birth year], 68 y.o., female Today's Date: 04/18/2024  PCP: Almarie Birmingham, NP REFERRING PROVIDER: Almarie Birmingham, NP  END OF SESSION:         Past Medical History:  Diagnosis Date   Allergy     Anxiety    Arthritis    hands   Cataract    Diabetes mellitus without complication (HCC)    type 2   Family history of adverse reaction to anesthesia    son has malignant hyperthermia, daughter does not daughter recently had c section without problems   GERD (gastroesophageal reflux disease)    Headache    sinus   Hyperlipidemia    Hypertension    PONV (postoperative nausea and vomiting)    nausea only   Ulcer, stomach peptic yrs ago   Past Surgical History:  Procedure Laterality Date   APPENDECTOMY     both hells bone spur repair     both heels with metal clips   both shoulder rotator cuff repair     CESAREAN SECTION     x 1   COLONOSCOPY WITH PROPOFOL  N/A 09/07/2016   Procedure: COLONOSCOPY WITH PROPOFOL ;  Surgeon: Vicci Gladis POUR, MD;  Location: [facility];  Service: Endoscopy;  Laterality: N/A;   colonscopy  06/2011   polyps   ELBOW FRACTURE SURGERY Left    EYE SURGERY     FRACTURE SURGERY     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/10/2022   Procedure: REVERSE SHOULDER ARTHROPLASTY;  Surgeon: Melita Drivers, MD;  Location: [facility];  Service: Orthopedics;  Laterality: Right;  Please follow in room 6 if able   TUBAL LIGATION     VESICO-VAGINAL FISTULA REPAIR     Patient Active Problem List   Diagnosis Date Noted   Aortic atherosclerosis 02/07/2024   Chronic right shoulder pain 02/07/2024   Gastroesophageal reflux disease 11/16/2021   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 08/18/2021   Dermatofibroma 08/18/2021   History of malignant neoplasm of skin 08/18/2021   Lentigo 08/18/2021   Melanocytic nevi of trunk 08/18/2021   Nevus  lipomatosus cutaneous superficialis 08/18/2021   Rosacea 08/18/2021   Actinic keratosis 08/18/2021   Sensorineural hearing loss (SNHL) of left ear with unrestricted hearing of right ear 07/26/2021   Tinnitus of left ear 07/26/2021   Acute recurrent maxillary sinusitis 06/22/2021   Primary hypertension 01/12/2021   Hyperlipidemia associated with type 2 diabetes mellitus (HCC) 01/12/2021   Arthritis 01/12/2021   Type II diabetes mellitus (HCC) 11/25/2019   Ingrown toenail 09/05/2018   Neck pain 01/29/2018   Tick bite 10/24/2017    ONSET DATE: 02/07/24  REFERRING DIAG: M19.90 (ICD-10-CM) - Arthritis      THERAPY DIAG:  No diagnosis found.  Rationale for Evaluation and Treatment: Rehabilitation  SUBJECTIVE:   SUBJECTIVE STATEMENT: Pt reports her hands are painful since she has not been using due to illness. 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 Pt was previously seen for OT and d/c 12/06/23  PRECAUTIONS: hx of R reverse toal shoulder, none for hands    WEIGHT BEARING RESTRICTIONS: No  PAIN:  Are you having pain? Yes: NPRS scale: R hand 6/10, L hand 6/10 Pain location: bilateral hands Pain description: aching Aggravating factors: overuse, waking up in pain Relieving factors: paraffin Pt has R shoulder pain  6-8/10, PT is addressing, OT will not address., FALLS: Has patient fallen in last 6 months? No  LIVING ENVIRONMENT: Lives with: lives alone Lives in: House/apartment   PLOF: Independent  PATIENT GOALS: decrease pain in hands, improve function/ mobility  NEXT MD VISIT: unknown  OBJECTIVE:  Note: Objective measures were completed at Evaluation unless otherwise noted.  HAND DOMINANCE: Right  ADLs:Pt reports increased overall difficulty with ADLs due to pain and stiffness. Pt is mod I with basic ADLs. She has difficulty  with buttons, styling hair, opening jars.    FUNCTIONAL OUTCOME MEASURES: Upper Extremity Functional Scale (UEFS): 33/80  UPPER EXTREMITY ROM:   RUE composite finger flexion 70% (3cm from palm at middle finger)     LUE composite finer flexion 60% (3.5cm from palm at middle finger) bilateral UE's grossly 90-95% composite finger extension     HAND FUNCTION: Grip strength: Right: 18 lbs; Left: 15 lbs, Lateral pinch: Right: 10 lbs, Left: 12 lbs, and Tip pinch: Right 4 lbs, Left: 2 lbs  COORDINATION:NT   SENSATION: WFL  EDEMA: Pt with bony defomities at PIP joints of all digits which pt reports are bone spurs, Pt also has bony deformity at DIP for right thumb, index and 5th digit and LUE small and index fingers   COGNITION: Overall cognitive status: Within functional limits for tasks assessed   OBSERVATIONS: Pleasant female well known to therapist from previous occupational therapy at this site   TREATMENT DATE: 04/22/24-Paraffin to right hand for pain and stiffness 8 mins, no adverse reactions while therapist performed US  to left hand and digits 3.3 mhz, 0.8 w/cm 2, 20% x 8 mins no adverse reactions. Paraffin to left hand for pain/ stiffness x 8 mins, no adverse reactions while therapist performed US  to right hand and digits 3.3 mhz, 0.8 w/cm 2, 20% x 8 mins no adverse reactions. Soft tissue/ joint mobs to hands, fingers  and wrists, forarms as well as P/ROM composite and individual finger flexion to digits. Pt performed grip strengthening with resistive tree for bilateral hands. Pt reports hands were looser at end of session   04/16/24-Paraffin to right hand for pain  and stiffness 8 mins, no adverse reactions while therapist performed US  to left hand and digits 3.3 mhz, 0.8 w/cm 2, 20% x 8 mins no adverse reactions. Paraffin to left hand for pain/ stiffness x 8 mins, no adverse reactions while therapist performed US  to right hand and digits 3.3 mhz, 0.8 w/cm 2, 20% x 8 mins no  adverse reactions. Soft tissue/ joint mobs to hands, fingers  and wrists, forarms as well as P/ROM composite and individual finger flexion to digits. Pt performed grip strengthening with resistive tree for bilateral hands. Pt reports hands were looser at end of session    12/10/25Paraffin to right hand for pain  and stiffness 8 mins, no adverse reactions while therapist performed US  to left hand and digits 3.3 mhz, 0.8 w/cm 2, 20% x 8 mins no adverse reactions. Paraffin to left hand for pain/ stiffness x 8 mins, no adverse reactions while therapist performed US  to right hand and digits 3.3 mhz, 0.8 w/cm 2, 20% x 8 mins no adverse reactions. Soft tissue/ joint mobs to hands, fingers  and wrists as well as P/ROM composite and individual finger flexion to digits. Therapist checked pinch strength and ROM at end of session. Pt met pinch strength goals.  12/3/25paraffin to right hand for pain management x 8 mins, no adverse reactions while therapist performed US  to  left hand and digits 3.3 mhz, 0.8 w/cm 2, 20% x 8 mins no adverse reactions. Paraffin to left hand for pain management x 8 mins, no adverse reactions while therapist performed US  to right hand and digits 3.3 mhz, 0.8 w/cm 2, 20% x 8 mins no adverse reactions. Soft tissue/ joint mobs to hands and wrists as well as P/ROM comosite finger flexion to digits.  11/18/25Paraffin to right hand for pain management x 8 mins, no adverse reactions while therapist performed US  to left hand and digits 3.3 mhz, 0.8 w/cm 2, 20% x 8 mins no adverse reactions. Paraffin to left hand for pain management x 8 mins, no adverse reactions while therapist performed US  to right hand and digits 3.3 mhz, 0.8 w/cm 2, 20% x 8 mins no adverse reactions. Pt reports seeking assistance for cooking soup for church and receiving a padddle at therapist recommendation for joint protection when stirring. Therpist recommends pt coniders delgating the soup task to others in the future or  that she seeks additional assist. araffin to right hand for pain management x 8 mins, no adverse reactions while therapist performed US  to left hand and digits 3.3 mhz, 0.8 w/cm 2, 20% x 8 mins no adverse reactions. Paraffin to left hand for pain management x 8 mins, no adverse reactions while therapist performed US  to right hand and digits 3.3 mhz, 0.8 w/cm 2, 20% x 8 mins no adverse reactions. with lotion. P/ROM to digits in flexion individually and compositely. A/ROM finger thumb opposition as able to bilateral hands  11/12/24Paraffin to right hand for pain management x 8 mins, no adverse reactions while therapist performed US  to left hand and digits 3.3 mhz, 0.8 w/cm 2, 20% x 8 mins no adverse reactions. Paraffin to left hand for pain management x 8 mins, no adverse reactions while therapist performed US  to right hand and digits 3.3 mhz, 0.8 w/cm 2, 20% x 8 mins no adverse reactions. Pt reports making changes to her baking schedule and ordering a padddle at therapist recommendation for joint protection. Soft tissue and joint mobs to bilateral hands, wrist and forearms. P/ROM to digits in flexion individually and compositely. Pt demonstrates improved flexibility end of session.       03/05/24 Paraffin to right hand for pain management x 8 mins, no adverse reactions while therapist performed US  to left hand and digits 3.3 mhz, 0.8 w/cm 2, 20% x 8 mins no adverse reactions. Paraffin to left hand for pain management x 8 mins, no adverse reactions while therapist performed US  to right hand and digits 3.3 mhz, 0.8 w/cm 2, 20% x 8 mins no adverse reactions.P/ROM to digits in flexion individually and compositely.discussed options for self care with pt's upcoming cooking activities: ie use of paddle, and suggestions for when pt has shoulder sugery.   Soft tissue and joint mobs to bilateral hands, wrist and forearms. Grip strengthening with stress tree for resisted grip  02/26/24 Paraffin to right hand  for pain management x 8 mins, no adverse reactions while therapist perfromed US  to left hand and digits 3.3 mhz, 0.8 w/cm 2, 20% x 8 mins no adverse reactions. Paraffin to left hand for pain management x 8 mins, no adverse reactions while therapist performed US  to right hand and digits 3.3 mhz, 0.8 w/cm 2, 20% x 8 mins no adverse reactions.P/ROM to digits in flexion  individually and compositely.  Soft tissue and joint mobs to bilateral hands, wrist and forearms.  Discussed improtance of balancing activity and rest  in order to management pain and prevent injury.  02/21/24- eval Paraffin to bilateral hands and wrists for pain management x 8 mins, no adverse reactions. P/ROM to digits in flexion  individually and compositely soft tissue mobs to bilateral hands. Pt reports decreased pain end of session.      PATIENT EDUCATION: Education details: see above Person educated: Patient Education method: Explanation Education comprehension: verbalized understanding  HOME EXERCISE PROGRAM: n/a  GOALS: Goals reviewed with patient? Yes  SHORT TERM GOALS: Target date: 03/23/24  I with inital HEP.  Goal status:  ongoing 03/13/24  2.    Pt will improve RUE tip pinch by 2 lbs(from baseline) for increased ease with daily activities. Revised goal:Pt will improve RUE tip pinch to 7 lbs for increased ease with daily activities.                               baseline: RUE tip 4lbs, LUE tip 2 lbs                          Goal status: met 6 lbs 04/10/24, continue with revised goal  3.  Pt will improve LUE tip pinch by 2 lbs(from baseline) for increased ease with daily activities. Revised goal:Pt will improve LUE tip pinch to 5 lbs for increased ease with daily activities. baseline: RUE tip 4lbs, LUE tip 2 lbs Goal status:  met 4lbs 04/10/24, continue with revised goal  4.  I with adapted strategies/ adapted equipment to minimize pain and to increase pt I with ADLs/IADLs   Goal status: ongoing, pt has  ordered a paddle to use for stirring soup, 03/19/24  5.  Pt will report bilateral hand pain no greater than 4/10 for ADLs/IADLS Baseline: RUE 7/10, LUE 6/10 Goal status: ongoing 03/13/24- pain continues to improve 4/10 today 03/19/24, 6/10 today  -04/10/24      LONG TERM GOALS: Target date: 05/15/24      Pt will improve RUE grip strength to 22 # or greater for increased functional use Baseline: RUE 18# Goal status:  ongoing 04/10/24  2.  Pt will improve LUE grip strength to 25 # or greater for increased functional use Baseline: LUE 21# Goal status:  ongoing 04/10/24   3.Pt will demonstrate improved composite finger flexion for ADLs as evidenced by pt. bringing middle fingertip for RUE within 2.75 cm of palm.             Baseline: 3 cm from palm to middle fingertip             Goal status: met today,  pt demonstrates 2 cm from palm today, met 04/10/24- will monitor for consistency   4. .Pt will demonstrate improved composite finger flexion for ADLs as evidenced by pt. bringing middle fingertip for LUE within 3.25 of palm.             Baseline: 3.5 cm from palm to middle fingertip             Goal status: met today, pt demonstrates 2 cm from palm today, met 04/10/24- will monitor for consistency    5. check 9 hole peg test and set goals prn- deferred 04/10/24 ASSESSMENT:  CLINICAL IMPRESSION: Patient is progressing towards goals.Pt reports ROM wasmproved at end of session. Pt demonstrates improving bilateral functional use.PERFORMANCE DEFICITS: in functional skills including ADLs, IADLs, coordination, dexterity, proprioception, sensation, edema, ROM, strength, pain, flexibility,  Fine motor control, Gross motor control, endurance, decreased knowledge of precautions, decreased knowledge of use of DME, and UE functional use,and psychosocial skills including coping strategies, environmental adaptation, habits, interpersonal interactions, and routines and behaviors.   IMPAIRMENTS: are  limiting patient from ADLs, IADLs, rest and sleep, play, leisure, and social participation.   COMORBIDITIES: may have co-morbidities  that affects occupational performance. Patient will benefit from skilled OT to address above impairments and improve overall function.  MODIFICATION OR ASSISTANCE TO COMPLETE EVALUATION: No modification of tasks or assist necessary to complete an evaluation.  OT OCCUPATIONAL PROFILE AND HISTORY: Detailed assessment: Review of records and additional review of physical, cognitive, psychosocial history related to current functional performance.  CLINICAL DECISION MAKING: LOW - limited treatment options, no task modification necessary  REHAB POTENTIAL: Good  EVALUATION COMPLEXITY: Low      PLAN:  OT FREQUENCY: 1-2x/week  OT DURATION: 12 weeks  PLANNED INTERVENTIONS: 97168 OT Re-evaluation, 97535 self care/ADL training, 02889 therapeutic exercise, 97530 therapeutic activity, 97112 neuromuscular re-education, 97140 manual therapy, 97035 ultrasound, 97018 paraffin, 02989 moist heat, 97010 cryotherapy, 97014 electrical stimulation unattended, 97760 Orthotic Initial, 97763 Orthotic/Prosthetic subsequent, scar mobilization, passive range of motion, functional mobility training, energy conservation, coping strategies training, patient/family education, and DME and/or AE instructions  RECOMMENDED OTHER SERVICES: n/a  CONSULTED AND AGREED WITH PLAN OF CARE: Patient  PLAN FOR NEXT SESSION:,  paraffin, US , gentle strengthening   Jamesyn Moorefield, OT 04/18/2024, 10:51 AM

## 2024-04-18 NOTE — Therapy (Signed)
 OUTPATIENT PHYSICAL THERAPY SHOULDER    Patient Name: Veronica Galvan MRN: [id number] DOB:03-18-1956, 68 y.o., female Today's Date: 04/18/2024  END OF SESSION:  PT End of Session - 04/18/24 1446     Visit Number 24    Date for Recertification  05/17/24    Authorization Type [employer] [place]    PT Start Time 1445    PT Stop Time 1545    PT Time Calculation (min) 60 min    Activity Tolerance Patient tolerated treatment well    Behavior During Therapy WFL for tasks assessed/performed           Past Medical History:  Diagnosis Date   Allergy     Anxiety    Arthritis    hands   Cataract    Diabetes mellitus without complication (HCC)    type 2   Family history of adverse reaction to anesthesia    son has malignant hyperthermia, daughter does not daughter recently had c section without problems   GERD (gastroesophageal reflux disease)    Headache    sinus   Hyperlipidemia    Hypertension    PONV (postoperative nausea and vomiting)    nausea only   Ulcer, stomach peptic yrs ago   Past Surgical History:  Procedure Laterality Date   APPENDECTOMY     both hells bone spur repair     both heels with metal clips   both shoulder rotator cuff repair     CESAREAN SECTION     x 1   COLONOSCOPY WITH PROPOFOL  N/A 09/07/2016   Procedure: COLONOSCOPY WITH PROPOFOL ;  Surgeon: Vicci Gladis POUR, MD;  Location: [facility];  Service: Endoscopy;  Laterality: N/A;   colonscopy  06/2011   polyps   ELBOW FRACTURE SURGERY Left    EYE SURGERY     FRACTURE SURGERY     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/10/2022   Procedure: REVERSE SHOULDER ARTHROPLASTY;  Surgeon: Melita Drivers, MD;  Location: [facility];  Service: Orthopedics;  Laterality: Right;  Please follow in room 6 if able   TUBAL LIGATION     VESICO-VAGINAL FISTULA REPAIR     Patient Active Problem List   Diagnosis Date Noted   Aortic atherosclerosis 02/07/2024   Chronic right shoulder pain 02/07/2024   Gastroesophageal  reflux disease 11/16/2021   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 08/18/2021   Dermatofibroma 08/18/2021   History of malignant neoplasm of skin 08/18/2021   Lentigo 08/18/2021   Melanocytic nevi of trunk 08/18/2021   Nevus lipomatosus cutaneous superficialis 08/18/2021   Rosacea 08/18/2021   Actinic keratosis 08/18/2021   Sensorineural hearing loss (SNHL) of left ear with unrestricted hearing of right ear 07/26/2021   Tinnitus of left ear 07/26/2021   Acute recurrent maxillary sinusitis 06/22/2021   Primary hypertension 01/12/2021   Hyperlipidemia associated with type 2 diabetes mellitus (HCC) 01/12/2021   Arthritis 01/12/2021   Type II diabetes mellitus (HCC) 11/25/2019   Ingrown toenail 09/05/2018   Neck pain 01/29/2018   Tick bite 10/24/2017    PCP: [name], [name]  REFERRING PROVIDER:[name], MD  REFERRING DIAG: s/p right reverse TSA with complications  THERAPY DIAG:  Muscle weakness (generalized)  Stiffness of right shoulder, not elsewhere classified  Chronic right shoulder pain  Acute pain of left shoulder  Spasm  Cervicalgia  Rationale for Evaluation and Treatment: Rehabilitation  ONSET DATE: 01/01/24  SUBJECTIVE:  SUBJECTIVE STATEMENT:  Patient continues to report general malaise and fatigue as well as LE thigh pain.  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 We saw her in PT for many visits.  She had good progress but during a very busy time from Thanksgiving to [place] Years she started having pain and some regression of motion.  She  has had x-rays, bone scan and a CT.  She saw [name] after the CT.  Feels like it could be an infection, PT vs a revision.   11/30/23 Patient reports that she saw the surgeon she is frustrated and upset as she feels that she still has no answers, it is felt that there is no loosening and no infection.  She is still hurting with basic ADL's, her AROM decreased beginning in March, as she started having increased pain in mid January after a very busy and active few month, she has an order for the left shoulder and the okay for us  to see her for her right shoulder for another month PAIN:  Are you having pain? Yes: NPRS scale: 7/10 Pain location: right shoulder, HA, left shoulder pain up to 8/10 with motions Pain description: dull ache at rest, sharp with motions Aggravating factors: quick motions, movements pain up to 10/10 Relieving factors: ice, rest, Tylenol  at best 3/10  PRECAUTIONS: none  RED FLAGS: None   WEIGHT BEARING RESTRICTIONS: No  FALLS:  Has patient fallen in last 6 months? no  LIVING ENVIRONMENT: Lives with: lives alone Lives in: House/apartment Stairs: No Has following equipment at home: None  OCCUPATION: retired  PLOF: Independent, very active with grandkids, does 5 [facility] a [facility] [employer]  PATIENT GOALS:dress without difficulty, do hair, have good ROM and less pain  NEXT MD VISIT: Mid November  OBJECTIVE:   DIAGNOSTIC FINDINGS:  See above  PATIENT SURVEYS:    COGNITION: Overall cognitive status: Within functional limits for tasks assessed     SENSATION: WFL  POSTURE: Fwd head, rounded shoulders, elevated and guarded shoulder  UPPER EXTREMITY ROM:   Active ROM Right AROM  eval Left AROM Eval Right AROM 02/22/24 Right  AROM 03/20/24                  AROM 11/30/23  Shoulder flexion 105 125 112 118                  R 120  Shoulder extension                        Shoulder abduction 85 100 90 93                  Right 100  Shoulder  adduction                        Shoulder internal rotation 0 30  10                  Right 28  Shoulder external rotation 46 65  j                    Right 60  Elbow flexion                        Elbow extension                        Wrist flexion                        Wrist extension                        Wrist ulnar deviation                        Wrist radial deviation                        Wrist pronation                        Wrist supination                        (Blank rows = not tested)  UPPER EXTREMITY MMT:  In the available ROM  MMT Right eval Left eval  Shoulder flexion 4- 4-  Shoulder extension    Shoulder abduction    Shoulder adduction    Shoulder internal rotation 4+ 4+  Shoulder external rotation 4-P! 4  Middle trapezius    Lower trapezius    Elbow flexion    Elbow extension    Wrist flexion    Wrist extension    Wrist ulnar deviation    Wrist radial deviation    Wrist pronation    Wrist supination    Grip strength (lbs)    (Blank rows = not tested)  PALPATION:  Very tight and tender in the pectoral, upper trap, the entire right upper arm, slight warmth in the right anterior lateral arm  Positive  impingement on the right   TODAY'S TREATMENT:                                                                                                                                         DATE:  04/18/24 Passive stretch LE's Passive stretch UE's STM to the LE's STM to the upper arms, the upper traps and the rhomboids and neck Vaso bilateral shoulders  04/16/24 Passive stretch LE's STM to LE's STM to the right upper trap, neck and rhomboid Passive stretch to the right shoulder Vaso right shoulder  04/11/24 Passive LE stretches STM to the LE's Passive ROM bilateral UE's STM to the right upper trap, neck, rhomboid and upper arm Right vaso 34 degrees  04/10/24 Passive stretch  bilateral UE's Passive stretch LE's STM to the upper traps, neck and the bilateral UE's very tender today  04/04/24 Passive stretch of the LE's  Passive stretch of the UE's STM to the LE's STM to the upper traps and rhomboids Vaso to the left shoulder  04/03/24 Nustep level 4 x 5 minutes Passive stretch shoulders bilaterally STM to the neck, upper traps and rhomboids Vaso 34 degrees right shoulder  03/26/24 UBE L 2 each way Passive stretch bilateral UE's STM to the upper traps, neck and rhomboids, some MFR to knots in the upper traps Vaso 34 degrees  03/20/24 UBE level 5 x 6 minutes Passive stretch bilateral UE's, passive stretch LE's STM to the upper traps, neck and rhomboids, some MFR to knots in the upper traps Vaso 34 degrees  03/19/24 UBE level 4 x 6 minutes 20# row 20# lats 5# straight arm pulls 5# chest press 3# serratus and isometric circles 2# wate bar OHP 2# wate bar extensions with PT assist Passive stretch LE's and the right shoulder all motions Vaso low pressure 34 degrees right shoulder  03/14/24 UBE level 4 x 6 minutes Rows 20# Lats 20# 2# wall slides and circles Passive stretch bilateral shoulders 3# wate bar chest press 3# wate bar flexion 5# small chest press,  serratus press and isometric circles PNF supine bilateral shoulders Vaso right shoulder 34 degrees  03/12/24 Rows 20# 2x10 Lats 20# 2x10 Chest press 5# 2x10 UBE level 4 x 6 minutes 3# serratus 3# isometric circles 2# ER/IR Passive strength right shoulder all motions, joint distraction Vaso 34 degrees low pressure right shoulder  03/07/24 Nustep level 4 x 7 minutes 5# straight arm pulls Ball vs wall 4 positions 15# rows 15# lats Supine 3# isometric circles bilaterally 3# serratus bilaterally Passive stretches bilaterally UE's Vaso medium pressure 34 degrees  PATIENT EDUCATION: Education details: poc Person educated: Patient Education method: Programmer, Multimedia, Facilities Manager, Actor cues, Verbal cues, and Handouts Education comprehension: verbalized understanding  HOME EXERCISE PROGRAM:  ASSESSMENT:  CLINICAL IMPRESSION:  Patient continues to report feeling run down and She is having spasms and pain in her upper legs some increaesed pain in the upper arms specifically the biceps.  She is very tight and very tender in the LE mms and the upper arms and upper traps.  We may switch to trying to help with the left shoulder pain, strength and function.  She reports that she will be seeing her MD tomorrow.   Patient reports that she is doing okay, she reports that she is trying to back off of some activities and break things down over time to ease the stress and strain thinks this is helping as she is making soup for the community and is doing this over a few days instead of all at one time..She has a lot of knots in the upper traps, neck and rhomboids, her ROM is improving.  She is having left shoulder pain as well due to it compensating so much, she has worries about this and needing the left shoulder to be strong so when she gets the right TSA she can have a good shoulder  Evaluation:  Patient is very frustrated, she had the right reverse TSA last July.  She did very well until around  Christmas, then she really started having increased pain and lost some ROM.  She has had blood work done, US , bone scan and CT performed.  There has been some talk of a possible infection, MD feels like she  may benefit from a revision.  She would like to try PT again and the MD agreed.  She has lost ROM since I last saw her, she also is having some significant left shoulder pain from over using it.   July 2025: Patient saw surgeon, the bone scan said did not feel that there was infection or loosening however the surgeon is not so sure, he wants to do an US  to be sure, that is scheduled for the next few weeks, since she has not been to PT in a month I re-measured her ROM, she has lost at least 15 degrees of motion and more for all motions.  She is also having more pain in the left shoulder possibly from overdoing it, because she is trying to not use the right shoulder.    OBJECTIVE IMPAIRMENTS: cardiopulmonary status limiting activity, decreased activity tolerance, decreased endurance, decreased ROM, decreased strength, increased edema, increased muscle spasms, impaired flexibility, impaired UE functional use, improper body mechanics, postural dysfunction, and pain.  REHAB POTENTIAL: Good  CLINICAL DECISION MAKING: Evolving/moderate complexity  EVALUATION COMPLEXITY: Low   GOALS: Goals reviewed with patient? Yes  SHORT TERM GOALS: Target date: 01/31/24  Independent with initial HEP Goal status: met 01/31/24  LONG TERM GOALS: Target date: 04/15/24  Decrease pain 50% Goal status: progressing 03/20/24 and 03/26/24, 04/11/24 2.  Dress without difficulty Goal status: progressing 03/20/24 and 03/26/24, 04/11/24 3.  Do hair without difficulty Goal status: progressing 03/14/24 and 03/26/24 4.  Increase AROM right shoulder flexion to 130 degrees Goal status:  5.  Increase right shoulder ER to 60 degrees Goal status: progressing 03/14/24, 04/18/24  6.  Return to water  aerobics and or gym  activity Goal status: on going 02/26/24  and 03/26/24  PLAN:  PT FREQUENCY: 1-2x/week  PT DURATION: 12 weeks  PLANNED INTERVENTIONS: Therapeutic exercises, Therapeutic activity, Neuromuscular re-education, Balance training, Gait training, Patient/Family education, Self Care, Joint mobilization, Dry Needling, Electrical stimulation, Cryotherapy, Vasopneumatic device, and Manual therapy  PLAN FOR NEXT SESSION: Try to regain some of her ROM and function, treat pain as needed, address the left shoulder as well  04/18/2024, 2:46 PM [facility] [address] [facility]. [place], [place], [address] Phone: [phone]   Fax:  [phone]

## 2024-04-22 ENCOUNTER — Ambulatory Visit: Admitting: Occupational Therapy

## 2024-04-22 ENCOUNTER — Encounter: Payer: Self-pay | Admitting: Occupational Therapy

## 2024-04-22 DIAGNOSIS — M6281 Muscle weakness (generalized): Secondary | ICD-10-CM

## 2024-04-22 DIAGNOSIS — M25642 Stiffness of left hand, not elsewhere classified: Secondary | ICD-10-CM

## 2024-04-22 DIAGNOSIS — M79642 Pain in left hand: Secondary | ICD-10-CM

## 2024-04-22 DIAGNOSIS — M79641 Pain in right hand: Secondary | ICD-10-CM

## 2024-04-22 DIAGNOSIS — R278 Other lack of coordination: Secondary | ICD-10-CM

## 2024-04-22 DIAGNOSIS — M25641 Stiffness of right hand, not elsewhere classified: Secondary | ICD-10-CM

## 2024-04-22 DIAGNOSIS — M25611 Stiffness of right shoulder, not elsewhere classified: Secondary | ICD-10-CM

## 2024-04-23 ENCOUNTER — Encounter: Payer: Self-pay | Admitting: Physical Therapy

## 2024-04-23 ENCOUNTER — Ambulatory Visit: Admitting: Physical Therapy

## 2024-04-23 DIAGNOSIS — M6281 Muscle weakness (generalized): Secondary | ICD-10-CM

## 2024-04-23 DIAGNOSIS — M25611 Stiffness of right shoulder, not elsewhere classified: Secondary | ICD-10-CM

## 2024-04-23 DIAGNOSIS — R252 Cramp and spasm: Secondary | ICD-10-CM

## 2024-04-23 DIAGNOSIS — M542 Cervicalgia: Secondary | ICD-10-CM

## 2024-04-23 DIAGNOSIS — G8929 Other chronic pain: Secondary | ICD-10-CM

## 2024-04-23 NOTE — Therapy (Signed)
 " OUTPATIENT PHYSICAL THERAPY SHOULDER    Patient Name: Veronica Galvan MRN: [id number] DOB:[birth year], 68 y.o., female Today's Date: 04/23/2024  END OF SESSION:  PT End of Session - 04/23/24 0928     Visit Number 25    Date for Recertification  05/17/24    Authorization Type [employer] [place]    PT Start Time [phone]    PT Stop Time 1030    PT Time Calculation (min) 62 min    Activity Tolerance Patient tolerated treatment well    Behavior During Therapy WFL for tasks assessed/performed           Past Medical History:  Diagnosis Date   Allergy     Anxiety    Arthritis    hands   Cataract    Diabetes mellitus without complication (HCC)    type 2   Family history of adverse reaction to anesthesia    son has malignant hyperthermia, daughter does not daughter recently had c section without problems   GERD (gastroesophageal reflux disease)    Headache    sinus   Hyperlipidemia    Hypertension    PONV (postoperative nausea and vomiting)    nausea only   Ulcer, stomach peptic yrs ago   Past Surgical History:  Procedure Laterality Date   APPENDECTOMY     both hells bone spur repair     both heels with metal clips   both shoulder rotator cuff repair     CESAREAN SECTION     x 1   COLONOSCOPY WITH PROPOFOL  N/A 09/07/2016   Procedure: COLONOSCOPY WITH PROPOFOL ;  Surgeon: Vicci Gladis POUR, MD;  Location: [facility];  Service: Endoscopy;  Laterality: N/A;   colonscopy  06/2011   polyps   ELBOW FRACTURE SURGERY Left    EYE SURGERY     FRACTURE SURGERY     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/10/2022   Procedure: REVERSE SHOULDER ARTHROPLASTY;  Surgeon: Melita Drivers, MD;  Location: [facility];  Service: Orthopedics;  Laterality: Right;  Please follow in room 6 if able   TUBAL LIGATION     VESICO-VAGINAL FISTULA REPAIR     Patient Active Problem List   Diagnosis Date Noted   Aortic atherosclerosis 02/07/2024   Chronic right shoulder pain 02/07/2024   Gastroesophageal  reflux disease 11/16/2021   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 08/18/2021   Dermatofibroma 08/18/2021   History of malignant neoplasm of skin 08/18/2021   Lentigo 08/18/2021   Melanocytic nevi of trunk 08/18/2021   Nevus lipomatosus cutaneous superficialis 08/18/2021   Rosacea 08/18/2021   Actinic keratosis 08/18/2021   Sensorineural hearing loss (SNHL) of left ear with unrestricted hearing of right ear 07/26/2021   Tinnitus of left ear 07/26/2021   Acute recurrent maxillary sinusitis 06/22/2021   Primary hypertension 01/12/2021   Hyperlipidemia associated with type 2 diabetes mellitus (HCC) 01/12/2021   Arthritis 01/12/2021   Type II diabetes mellitus (HCC) 11/25/2019   Ingrown toenail 09/05/2018   Neck pain 01/29/2018   Tick bite 10/24/2017    PCP: [name], [name]  REFERRING PROVIDER:[name], MD  REFERRING DIAG: s/p right reverse TSA with complications  THERAPY DIAG:  Muscle weakness (generalized)  Stiffness of right shoulder, not elsewhere classified  Chronic right shoulder pain  Cervicalgia  Spasm  Rationale for Evaluation and Treatment: Rehabilitation  ONSET DATE: 01/01/24  SUBJECTIVE:  SUBJECTIVE STATEMENT:  Patient has been very active, having some increased soreness in the shoulders  Evaluation:  Patient had a fall on her deck, tripped on a board that was sticking up and had a severe fracture of the right proximal humerus, 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 We saw her in PT for many visits.  She had good progress but during a very busy time from Thanksgiving to [place] Years she started having pain and some regression of motion.  She has had x-rays, bone scan and a CT.  She saw [name] after the CT.  Feels like it could be an infection, PT  vs a revision.   11/30/23 Patient reports that she saw the surgeon she is frustrated and upset as she feels that she still has no answers, it is felt that there is no loosening and no infection.  She is still hurting with basic ADL's, her AROM decreased beginning in March, as she started having increased pain in mid January after a very busy and active few month, she has an order for the left shoulder and the okay for us  to see her for her right shoulder for another month PAIN:  Are you having pain? Yes: NPRS scale: 7/10 Pain location: right shoulder, HA, left shoulder pain up to 8/10 with motions Pain description: dull ache at rest, sharp with motions Aggravating factors: quick motions, movements pain up to 10/10 Relieving factors: ice, rest, Tylenol  at best 3/10  PRECAUTIONS: none  RED FLAGS: None   WEIGHT BEARING RESTRICTIONS: No  FALLS:  Has patient fallen in last 6 months? no  LIVING ENVIRONMENT: Lives with: lives alone Lives in: House/apartment Stairs: No Has following equipment at home: None  OCCUPATION: retired  PLOF: Independent, very active with grandkids, does 5 [facility] a [facility] [employer]  PATIENT GOALS:dress without difficulty, do hair, have good ROM and less pain  NEXT MD VISIT: Mid November  OBJECTIVE:   DIAGNOSTIC FINDINGS:  See above  PATIENT SURVEYS:    COGNITION: Overall cognitive status: Within functional limits for tasks assessed     SENSATION: WFL  POSTURE: Fwd head, rounded shoulders, elevated and guarded shoulder  UPPER EXTREMITY ROM:   Active ROM Right AROM  eval Left AROM Eval Right AROM 02/22/24 Right  AROM 03/20/24                  AROM 11/30/23  Shoulder flexion 105 125 112 118                  R 120  Shoulder extension                        Shoulder abduction 85 100 90 93                  Right 100  Shoulder adduction                        Shoulder internal rotation 0 30  10                  Right 28  Shoulder external  rotation 46 65  j                    Right 60  Elbow flexion                        Elbow extension                        Wrist flexion                        Wrist extension                        Wrist ulnar deviation                        Wrist radial deviation                        Wrist pronation                        Wrist supination                        (Blank rows = not tested)  UPPER EXTREMITY MMT:  In the available ROM  MMT Right eval Left eval  Shoulder flexion 4- 4-  Shoulder extension    Shoulder abduction    Shoulder adduction    Shoulder internal rotation 4+ 4+  Shoulder external rotation 4-P! 4  Middle trapezius    Lower trapezius    Elbow flexion    Elbow extension    Wrist flexion    Wrist extension    Wrist ulnar deviation    Wrist radial deviation    Wrist pronation    Wrist supination    Grip strength (lbs)    (Blank rows = not tested)  PALPATION:  Very tight and tender in the pectoral, upper trap, the entire right upper arm, slight warmth in the right anterior lateral arm  Positive impingement on the right   TODAY'S TREATMENT:                                                                                                                                          DATE:  04/23/24 UBE x 4 minutes Passive stretch bilateral shoulders STM to the upper arms very tender deltoid and biceps STM to the neck, upper traps and rhomboids Vaso left shoulder  04/18/24 Passive stretch LE's Passive stretch UE's STM to the LE's STM to the upper arms, the upper traps and the rhomboids and neck Vaso bilateral shoulders  04/16/24 Passive stretch LE's STM to LE's STM to the right upper trap, neck and rhomboid Passive stretch to the right shoulder Vaso right shoulder  04/11/24 Passive LE stretches STM to the LE's Passive ROM bilateral UE's STM to the right upper trap, neck, rhomboid and upper arm Right vaso 34 degrees  04/10/24 Passive stretch bilateral UE's Passive stretch LE's STM to the upper traps, neck and the bilateral UE's very tender today  04/04/24 Passive stretch of the LE's  Passive stretch of the UE's STM to the LE's STM to the upper traps and rhomboids Vaso to the left shoulder  04/03/24 Nustep level 4 x 5 minutes Passive stretch shoulders bilaterally STM to the neck, upper traps and rhomboids Vaso 34 degrees right shoulder  03/26/24 UBE L 2 each way Passive stretch bilateral UE's STM to the upper traps, neck and rhomboids, some MFR to knots in the upper traps Vaso 34 degrees  03/20/24 UBE level 5 x 6 minutes Passive stretch bilateral UE's, passive stretch LE's STM to the upper traps, neck and rhomboids, some MFR to knots in the upper traps Vaso 34 degrees  03/19/24 UBE level 4 x 6 minutes 20# row 20# lats 5# straight arm pulls 5# chest press 3# serratus and isometric circles 2# wate bar OHP 2# wate bar extensions with PT assist Passive stretch LE's and the right shoulder all motions Vaso low pressure 34 degrees right shoulder  03/14/24 UBE level 4 x 6 minutes Rows 20# Lats 20# 2# wall slides and circles Passive stretch bilateral shoulders 3#  wate bar chest press 3# wate bar flexion 5# small chest press, serratus press and isometric circles PNF supine bilateral shoulders Vaso right shoulder 34 degrees  03/12/24 Rows 20# 2x10 Lats 20# 2x10 Chest press 5# 2x10 UBE level 4 x 6 minutes 3# serratus 3# isometric circles 2# ER/IR Passive strength right shoulder all motions, joint distraction Vaso 34 degrees low pressure right shoulder  03/07/24 Nustep level 4 x 7 minutes 5# straight arm pulls Ball vs wall 4 positions 15# rows 15# lats Supine 3# isometric circles bilaterally 3# serratus bilaterally Passive stretches bilaterally UE's Vaso medium pressure 34 degrees  PATIENT EDUCATION: Education details: Dealer educated: Patient Education method: Programmer, Multimedia, Facilities Manager, Actor cues, Verbal cues, and Handouts Education comprehension: verbalized understanding  HOME EXERCISE PROGRAM:  ASSESSMENT:  CLINICAL IMPRESSION:  Patient doing better, she has been very active and both shoulders are hurting more, she is very tender in the biceps and deltoid.  We may switch to trying to help with the left shoulder pain, strength and function.  We will need to see what happens after the holidays where she is very busy   Patient reports that she is doing okay, she reports that she is trying to back off of some activities and break things down over time to ease the stress and strain thinks this is helping as she is making soup for the community and is doing this over a few days instead of all at one time..She has a lot of knots in the upper traps, neck and rhomboids, her ROM is improving.  She is having left shoulder pain as well due to it compensating so much, she has worries about this and needing the left shoulder to be strong so when she gets the right TSA she can have a good shoulder  Evaluation:  Patient is very frustrated, she had the right reverse TSA last July.  She did very well until around Christmas, then she really  started having increased pain and lost some ROM.  She has had blood work done, US , bone scan and CT performed.  There  has been some talk of a possible infection, MD feels like she may benefit from a revision.  She would like to try PT again and the MD agreed.  She has lost ROM since I last saw her, she also is having some significant left shoulder pain from over using it.   July 2025: Patient saw surgeon, the bone scan said did not feel that there was infection or loosening however the surgeon is not so sure, he wants to do an US  to be sure, that is scheduled for the next few weeks, since she has not been to PT in a month I re-measured her ROM, she has lost at least 15 degrees of motion and more for all motions.  She is also having more pain in the left shoulder possibly from overdoing it, because she is trying to not use the right shoulder.    OBJECTIVE IMPAIRMENTS: cardiopulmonary status limiting activity, decreased activity tolerance, decreased endurance, decreased ROM, decreased strength, increased edema, increased muscle spasms, impaired flexibility, impaired UE functional use, improper body mechanics, postural dysfunction, and pain.  REHAB POTENTIAL: Good  CLINICAL DECISION MAKING: Evolving/moderate complexity  EVALUATION COMPLEXITY: Low   GOALS: Goals reviewed with patient? Yes  SHORT TERM GOALS: Target date: 01/31/24  Independent with initial HEP Goal status: met 01/31/24  LONG TERM GOALS: Target date: 04/15/24  Decrease pain 50% Goal status: progressing 03/20/24 and 03/26/24, 04/11/24 2.  Dress without difficulty Goal status: progressing 03/20/24 and 03/26/24, 04/11/24 3.  Do hair without difficulty Goal status: progressing 03/14/24 and 03/26/24 4.  Increase AROM right shoulder flexion to 130 degrees Goal status:  5.  Increase right shoulder ER to 60 degrees Goal status: progressing 03/14/24, 04/18/24  6.  Return to water  aerobics and or gym activity Goal status: on going  02/26/24  and 03/26/24  PLAN:  PT FREQUENCY: 1-2x/week  PT DURATION: 12 weeks  PLANNED INTERVENTIONS: Therapeutic exercises, Therapeutic activity, Neuromuscular re-education, Balance training, Gait training, Patient/Family education, Self Care, Joint mobilization, Dry Needling, Electrical stimulation, Cryotherapy, Vasopneumatic device, and Manual therapy  PLAN FOR NEXT SESSION: Try to regain some of her ROM and function, treat pain as needed, address the left shoulder as well  04/23/2024, 9:29 AM [facility] [place], [place], [address] Phone: [phone]   Fax:  [phone] "

## 2024-04-30 ENCOUNTER — Ambulatory Visit: Admitting: Physical Therapy

## 2024-04-30 ENCOUNTER — Encounter: Payer: Self-pay | Admitting: Occupational Therapy

## 2024-04-30 ENCOUNTER — Ambulatory Visit: Admitting: Occupational Therapy

## 2024-04-30 ENCOUNTER — Encounter: Payer: Self-pay | Admitting: Physical Therapy

## 2024-04-30 DIAGNOSIS — M25642 Stiffness of left hand, not elsewhere classified: Secondary | ICD-10-CM

## 2024-04-30 DIAGNOSIS — M25641 Stiffness of right hand, not elsewhere classified: Secondary | ICD-10-CM

## 2024-04-30 DIAGNOSIS — R278 Other lack of coordination: Secondary | ICD-10-CM

## 2024-04-30 DIAGNOSIS — M6281 Muscle weakness (generalized): Secondary | ICD-10-CM

## 2024-04-30 DIAGNOSIS — M25611 Stiffness of right shoulder, not elsewhere classified: Secondary | ICD-10-CM

## 2024-04-30 DIAGNOSIS — M25512 Pain in left shoulder: Secondary | ICD-10-CM

## 2024-04-30 DIAGNOSIS — G8929 Other chronic pain: Secondary | ICD-10-CM

## 2024-04-30 DIAGNOSIS — R252 Cramp and spasm: Secondary | ICD-10-CM

## 2024-04-30 DIAGNOSIS — M542 Cervicalgia: Secondary | ICD-10-CM

## 2024-04-30 DIAGNOSIS — M79641 Pain in right hand: Secondary | ICD-10-CM

## 2024-04-30 DIAGNOSIS — M79642 Pain in left hand: Secondary | ICD-10-CM

## 2024-04-30 NOTE — Therapy (Signed)
 " OUTPATIENT OCCUPATIONAL THERAPY ORTHO  treatment  Patient Name: Veronica Galvan MRN: [id number] DOB:[birth year], 68 y.o., female Today's Date: 04/30/2024  PCP: Almarie Birmingham, NP REFERRING PROVIDER: Almarie Birmingham, NP  END OF SESSION:  OT End of Session - 04/30/24 1221     Visit Number 10    Number of Visits 12    Date for Recertification  05/15/24    Authorization Type [employer] MCR    Authorization Time Period 12 weeks    Authorization - Visit Number 10    Progress Note Due on Visit 10    OT Start Time 0849    OT Stop Time 0930    OT Time Calculation (min) 41 min                 Past Medical History:  Diagnosis Date   Allergy     Anxiety    Arthritis    hands   Cataract    Diabetes mellitus without complication (HCC)    type 2   Family history of adverse reaction to anesthesia    son has malignant hyperthermia, daughter does not daughter recently had c section without problems   GERD (gastroesophageal reflux disease)    Headache    sinus   Hyperlipidemia    Hypertension    PONV (postoperative nausea and vomiting)    nausea only   Ulcer, stomach peptic yrs ago   Past Surgical History:  Procedure Laterality Date   APPENDECTOMY     both hells bone spur repair     both heels with metal clips   both shoulder rotator cuff repair     CESAREAN SECTION     x 1   COLONOSCOPY WITH PROPOFOL  N/A 09/07/2016   Procedure: COLONOSCOPY WITH PROPOFOL ;  Surgeon: Vicci Gladis POUR, MD;  Location: [facility];  Service: Endoscopy;  Laterality: N/A;   colonscopy  06/2011   polyps   ELBOW FRACTURE SURGERY Left    EYE SURGERY     FRACTURE SURGERY     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/10/2022   Procedure: REVERSE SHOULDER ARTHROPLASTY;  Surgeon: Melita Drivers, MD;  Location: [facility];  Service: Orthopedics;  Laterality: Right;  Please follow in room 6 if able   TUBAL LIGATION     VESICO-VAGINAL FISTULA REPAIR     Patient Active Problem List   Diagnosis Date Noted    Aortic atherosclerosis 02/07/2024   Chronic right shoulder pain 02/07/2024   Gastroesophageal reflux disease 11/16/2021   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 08/18/2021   Dermatofibroma 08/18/2021   History of malignant neoplasm of skin 08/18/2021   Lentigo 08/18/2021   Melanocytic nevi of trunk 08/18/2021   Nevus lipomatosus cutaneous superficialis 08/18/2021   Rosacea 08/18/2021   Actinic keratosis 08/18/2021   Sensorineural hearing loss (SNHL) of left ear with unrestricted hearing of right ear 07/26/2021   Tinnitus of left ear 07/26/2021   Acute recurrent maxillary sinusitis 06/22/2021   Primary hypertension 01/12/2021   Hyperlipidemia associated with type 2 diabetes mellitus (HCC) 01/12/2021   Arthritis 01/12/2021   Type II diabetes mellitus (HCC) 11/25/2019   Ingrown toenail 09/05/2018   Neck pain 01/29/2018   Tick bite 10/24/2017    ONSET DATE: 02/07/24  REFERRING DIAG: M19.90 (ICD-10-CM) - Arthritis      THERAPY DIAG:  Muscle weakness (generalized)  Pain in right hand  Pain in left hand  Other lack of coordination  Stiffness of left hand, not elsewhere classified  Stiffness of right  hand, not elsewhere classified  Rationale for Evaluation and Treatment: Rehabilitation  SUBJECTIVE:   SUBJECTIVE STATEMENT: Pt reports her hands are painful after washing dishes Pt accompanied by: self  PERTINENT HISTORY: 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 Pt was previously seen for OT and d/c 12/06/23  PRECAUTIONS: hx of R reverse toal shoulder, none for hands    WEIGHT BEARING RESTRICTIONS: No  PAIN:  Are you having pain? Yes: NPRS scale: 7-9/10 Pain location: bilateral hands Pain description: aching Aggravating factors: overuse, waking up in pain Relieving factors: paraffin Pt has R shoulder pain 6-8/10, PT is addressing, OT will not  address., FALLS: Has patient fallen in last 6 months? No  LIVING ENVIRONMENT: Lives with: lives alone Lives in: House/apartment   PLOF: Independent  PATIENT GOALS: decrease pain in hands, improve function/ mobility  NEXT MD VISIT: unknown  OBJECTIVE:  Note: Objective measures were completed at Evaluation unless otherwise noted.  HAND DOMINANCE: Right  ADLs:Pt reports increased overall difficulty with ADLs due to pain and stiffness. Pt is mod I with basic ADLs. She has difficulty with buttons, styling hair, opening jars.    FUNCTIONAL OUTCOME MEASURES: Upper Extremity Functional Scale (UEFS): 33/80  UPPER EXTREMITY ROM:   RUE composite finger flexion 70% (3cm from palm at middle finger)     LUE composite finer flexion 60% (3.5cm from palm at middle finger) bilateral UE's grossly 90-95% composite finger extension     HAND FUNCTION: Grip strength: Right: 18 lbs; Left: 15 lbs, Lateral pinch: Right: 10 lbs, Left: 12 lbs, and Tip pinch: Right 4 lbs, Left: 2 lbs  COORDINATION:NT   SENSATION: WFL  EDEMA: Pt with bony defomities at PIP joints of all digits which pt reports are bone spurs, Pt also has bony deformity at DIP for right thumb, index and 5th digit and LUE small and index fingers   COGNITION: Overall cognitive status: Within functional limits for tasks assessed   OBSERVATIONS: Pleasant female well known to therapist from previous occupational therapy at this site   TREATMENT DATE: 04/30/24-Pt with increased hand pain after overuse this weekend washing dishes.  Paraffin to right hand for pain and stiffness 8 mins, no adverse reactions while therapist performed US  to left hand and digits 3.3 mhz, 0.8 w/cm 2, 20% x 8 mins no adverse reactions. Paraffin to left hand for pain/ stiffness x 8 mins, no adverse reactions while therapist performed US  to right hand and digits 3.3 mhz, 0.8 w/cm 2, 20% x 8 mins no adverse reactions. Soft tissue/ joint mobs to hands, fingers   and wrists, forearms as well as P/ROM individual finger flexion, composite finger flexion to digits. Pt reports she purchased microwave heated mits for hands and they are working well  04/22/24-Pt with increased hand pain after overuse this weekend.  Paraffin to right hand for pain and stiffness 8 mins, no adverse reactions while therapist performed US  to left hand and digits 3.3 mhz, 0.8 w/cm 2, 20% x 8 mins no adverse reactions. Paraffin to left hand for pain/ stiffness x 8 mins, no adverse reactions while therapist performed US  to right hand and digits 3.3 mhz, 0.8 w/cm 2, 20% x 8 mins no adverse reactions. Soft tissue/ joint mobs to hands, fingers  and wrists, forarms as well as P/ROM composite finger flexion to digits. Discussed use of microwave hot mitts for managing hand pain while traveling or after shoulder surgery. Pt ordered  them online.   04/16/24-Paraffin to right hand for pain  and stiffness 8 mins, no adverse reactions while therapist performed US  to left hand and digits 3.3 mhz, 0.8 w/cm 2, 20% x 8 mins no adverse reactions. Paraffin to left hand for pain/ stiffness x 8 mins, no adverse reactions while therapist performed US  to right hand and digits 3.3 mhz, 0.8 w/cm 2, 20% x 8 mins no adverse reactions. Soft tissue/ joint mobs to hands, fingers  and wrists, forarms as well as P/ROM composite and individual finger flexion to digits. Pt performed grip strengthening with resistive tree for bilateral hands. Pt reports hands were looser at end of session    12/10/25Paraffin to right hand for pain  and stiffness 8 mins, no adverse reactions while therapist performed US  to left hand and digits 3.3 mhz, 0.8 w/cm 2, 20% x 8 mins no adverse reactions. Paraffin to left hand for pain/ stiffness x 8 mins, no adverse reactions while therapist performed US  to right hand and digits 3.3 mhz, 0.8 w/cm 2, 20% x 8 mins no adverse reactions. Soft tissue/ joint mobs to hands, fingers  and wrists as well  as P/ROM composite and individual finger flexion to digits. Therapist checked pinch strength and ROM at end of session. Pt met pinch strength goals.  12/3/25paraffin to right hand for pain management x 8 mins, no adverse reactions while therapist performed US  to left hand and digits 3.3 mhz, 0.8 w/cm 2, 20% x 8 mins no adverse reactions. Paraffin to left hand for pain management x 8 mins, no adverse reactions while therapist performed US  to right hand and digits 3.3 mhz, 0.8 w/cm 2, 20% x 8 mins no adverse reactions. Soft tissue/ joint mobs to hands and wrists as well as P/ROM comosite finger flexion to digits.  11/18/25Paraffin to right hand for pain management x 8 mins, no adverse reactions while therapist performed US  to left hand and digits 3.3 mhz, 0.8 w/cm 2, 20% x 8 mins no adverse reactions. Paraffin to left hand for pain management x 8 mins, no adverse reactions while therapist performed US  to right hand and digits 3.3 mhz, 0.8 w/cm 2, 20% x 8 mins no adverse reactions. Pt reports seeking assistance for cooking soup for church and receiving a padddle at therapist recommendation for joint protection when stirring. Therpist recommends pt coniders delgating the soup task to others in the future or that she seeks additional assist. araffin to right hand for pain management x 8 mins, no adverse reactions while therapist performed US  to left hand and digits 3.3 mhz, 0.8 w/cm 2, 20% x 8 mins no adverse reactions. Paraffin to left hand for pain management x 8 mins, no adverse reactions while therapist performed US  to right hand and digits 3.3 mhz, 0.8 w/cm 2, 20% x 8 mins no adverse reactions. with lotion. P/ROM to digits in flexion individually and compositely. A/ROM finger thumb opposition as able to bilateral hands  11/12/24Paraffin to right hand for pain management x 8 mins, no adverse reactions while therapist performed US  to left hand and digits 3.3 mhz, 0.8 w/cm 2, 20% x 8 mins no adverse  reactions. Paraffin to left hand for pain management x 8 mins, no adverse reactions while therapist performed US  to right hand and digits 3.3 mhz, 0.8 w/cm 2, 20% x 8 mins no adverse reactions. Pt reports making changes to her baking schedule and ordering a padddle at therapist recommendation for joint protection. Soft tissue and joint mobs to bilateral  hands, wrist and forearms. P/ROM to digits in flexion individually and compositely. Pt demonstrates improved flexibility end of session.       03/05/24 Paraffin to right hand for pain management x 8 mins, no adverse reactions while therapist performed US  to left hand and digits 3.3 mhz, 0.8 w/cm 2, 20% x 8 mins no adverse reactions. Paraffin to left hand for pain management x 8 mins, no adverse reactions while therapist performed US  to right hand and digits 3.3 mhz, 0.8 w/cm 2, 20% x 8 mins no adverse reactions.P/ROM to digits in flexion individually and compositely.discussed options for self care with pt's upcoming cooking activities: ie use of paddle, and suggestions for when pt has shoulder sugery.   Soft tissue and joint mobs to bilateral hands, wrist and forearms. Grip strengthening with stress tree for resisted grip  02/26/24 Paraffin to right hand for pain management x 8 mins, no adverse reactions while therapist perfromed US  to left hand and digits 3.3 mhz, 0.8 w/cm 2, 20% x 8 mins no adverse reactions. Paraffin to left hand for pain management x 8 mins, no adverse reactions while therapist performed US  to right hand and digits 3.3 mhz, 0.8 w/cm 2, 20% x 8 mins no adverse reactions.P/ROM to digits in flexion  individually and compositely.  Soft tissue and joint mobs to bilateral hands, wrist and forearms.  Discussed improtance of balancing activity and rest in order to management pain and prevent injury.  02/21/24- eval Paraffin to bilateral hands and wrists for pain management x 8 mins, no adverse reactions. P/ROM to digits in flexion   individually and compositely soft tissue mobs to bilateral hands. Pt reports decreased pain end of session.      PATIENT EDUCATION: Education details: see above Person educated: Patient Education method: Explanation Education comprehension: verbalized understanding  HOME EXERCISE PROGRAM: n/a  GOALS: Goals reviewed with patient? Yes  SHORT TERM GOALS: Target date: 03/23/24  I with inital HEP.  Goal status:  ongoing 04/30/24  2.    Pt will improve RUE tip pinch by 2 lbs(from baseline) for increased ease with daily activities. Revised goal:Pt will improve RUE tip pinch to 7 lbs for increased ease with daily activities.                               baseline: RUE tip 4lbs, LUE tip 2 lbs                          Goal status: met 6 lbs 04/10/24, continue with revised goal  3.  Pt will improve LUE tip pinch by 2 lbs(from baseline) for increased ease with daily activities. Revised goal:Pt will improve LUE tip pinch to 5 lbs for increased ease with daily activities. baseline: RUE tip 4lbs, LUE tip 2 lbs Goal status:  met 4lbs 04/10/24, continue with revised goal  4.  I with adapted strategies/ adapted equipment to minimize pain and to increase pt I with ADLs/IADLs   Goal status: ongoing, pt has ordered a paddle to use for stirring soup, hot mitts ordered for pain management 04/30/24  5.  Pt will report bilateral hand pain no greater than 4/10 for ADLs/IADLS Baseline: RUE 7/10, LUE 6/10 Goal status: ongoing 03/13/24- pain continues to improve 4/10 today 03/19/24, 6-9/10 today  -04/30/24      LONG TERM GOALS: Target date: 05/15/24      Pt will improve RUE  grip strength to 22 # or greater for increased functional use Baseline: RUE 18# Goal status:  ongoing 20# 04/30/24  2.  Pt will improve LUE grip strength to 25 # or greater for increased functional use Baseline: LUE 21# Goal status:  ongoing 17# pts shoulder is painful 04/30/24   3.Pt will demonstrate improved composite  finger flexion for ADLs as evidenced by pt. bringing middle fingertip for RUE within 2.75 cm of palm.             Baseline: 3 cm from palm to middle fingertip             Goal status: met today,  pt demonstrates 2 cm from palm today, met 04/10/24- will monitor for consistency   4. .Pt will demonstrate improved composite finger flexion for ADLs as evidenced by pt. bringing middle fingertip for LUE within 3.25 of palm.             Baseline: 3.5 cm from palm to middle fingertip             Goal status: met today, pt demonstrates 2 cm from palm today, met 04/10/24- will monitor for consistency    5. check 9 hole peg test and set goals prn- deferred 04/10/24 ASSESSMENT:  CLINICAL IMPRESSION: For the reporting period of  02/21/24- 04/30/24. Patient is progressing towards goals.She achieved 2/5 short term goals and she demonstrates progress towards remaining goals with improving ROM, strength and decreased pain following occupational therapy. Pt presetns with the performance deficits below. she can benefit from skilled occupational therapy to address these deficits in order to maximize pt's safety and I with ADLs/IADLs.PERFORMANCE DEFICITS: in functional skills including ADLs, IADLs, coordination, dexterity, proprioception, sensation, edema, ROM, strength, pain, flexibility, Fine motor control, Gross motor control, endurance, decreased knowledge of precautions, decreased knowledge of use of DME, and UE functional use,and psychosocial skills including coping strategies, environmental adaptation, habits, interpersonal interactions, and routines and behaviors.   IMPAIRMENTS: are limiting patient from ADLs, IADLs, rest and sleep, play, leisure, and social participation.   COMORBIDITIES: may have co-morbidities  that affects occupational performance. Patient will benefit from skilled OT to address above impairments and improve overall function.  MODIFICATION OR ASSISTANCE TO COMPLETE EVALUATION: No  modification of tasks or assist necessary to complete an evaluation.  OT OCCUPATIONAL PROFILE AND HISTORY: Detailed assessment: Review of records and additional review of physical, cognitive, psychosocial history related to current functional performance.  CLINICAL DECISION MAKING: LOW - limited treatment options, no task modification necessary  REHAB POTENTIAL: Good  EVALUATION COMPLEXITY: Low      PLAN:  OT FREQUENCY: 1-2x/week  OT DURATION: 12 weeks  PLANNED INTERVENTIONS: 97168 OT Re-evaluation, 97535 self care/ADL training, 02889 therapeutic exercise, 97530 therapeutic activity, 97112 neuromuscular re-education, 97140 manual therapy, 97035 ultrasound, 97018 paraffin, 02989 moist heat, 97010 cryotherapy, 97014 electrical stimulation unattended, 97760 Orthotic Initial, 97763 Orthotic/Prosthetic subsequent, scar mobilization, passive range of motion, functional mobility training, energy conservation, coping strategies training, patient/family education, and DME and/or AE instructions  RECOMMENDED OTHER SERVICES: n/a  CONSULTED AND AGREED WITH PLAN OF CARE: Patient  PLAN FOR NEXT SESSION:,  paraffin, US ,  issue HEP   Daaron Dimarco, OT 04/30/2024, 12:22 PM   "

## 2024-04-30 NOTE — Therapy (Signed)
 " OUTPATIENT PHYSICAL THERAPY SHOULDER    Patient Name: Veronica Galvan MRN: [id number] DOB:Feb 20, 1956, 68 y.o., female Today's Date: 04/30/2024  END OF SESSION:  PT End of Session - 04/30/24 0755     Visit Number 26    Date for Recertification  05/17/24    Authorization Type [employer] [place]    PT Start Time [phone]    PT Stop Time 0945    PT Time Calculation (min) 110 min    Activity Tolerance Patient tolerated treatment well    Behavior During Therapy WFL for tasks assessed/performed           Past Medical History:  Diagnosis Date   Allergy     Anxiety    Arthritis    hands   Cataract    Diabetes mellitus without complication (HCC)    type 2   Family history of adverse reaction to anesthesia    son has malignant hyperthermia, daughter does not daughter recently had c section without problems   GERD (gastroesophageal reflux disease)    Headache    sinus   Hyperlipidemia    Hypertension    PONV (postoperative nausea and vomiting)    nausea only   Ulcer, stomach peptic yrs ago   Past Surgical History:  Procedure Laterality Date   APPENDECTOMY     both hells bone spur repair     both heels with metal clips   both shoulder rotator cuff repair     CESAREAN SECTION     x 1   COLONOSCOPY WITH PROPOFOL  N/A 09/07/2016   Procedure: COLONOSCOPY WITH PROPOFOL ;  Surgeon: Vicci Gladis POUR, MD;  Location: [facility];  Service: Endoscopy;  Laterality: N/A;   colonscopy  06/2011   polyps   ELBOW FRACTURE SURGERY Left    EYE SURGERY     FRACTURE SURGERY     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/10/2022   Procedure: REVERSE SHOULDER ARTHROPLASTY;  Surgeon: Melita Drivers, MD;  Location: [facility];  Service: Orthopedics;  Laterality: Right;  Please follow in room 6 if able   TUBAL LIGATION     VESICO-VAGINAL FISTULA REPAIR     Patient Active Problem List   Diagnosis Date Noted   Aortic atherosclerosis 02/07/2024   Chronic right shoulder pain 02/07/2024    Gastroesophageal reflux disease 11/16/2021   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 08/18/2021   Dermatofibroma 08/18/2021   History of malignant neoplasm of skin 08/18/2021   Lentigo 08/18/2021   Melanocytic nevi of trunk 08/18/2021   Nevus lipomatosus cutaneous superficialis 08/18/2021   Rosacea 08/18/2021   Actinic keratosis 08/18/2021   Sensorineural hearing loss (SNHL) of left ear with unrestricted hearing of right ear 07/26/2021   Tinnitus of left ear 07/26/2021   Acute recurrent maxillary sinusitis 06/22/2021   Primary hypertension 01/12/2021   Hyperlipidemia associated with type 2 diabetes mellitus (HCC) 01/12/2021   Arthritis 01/12/2021   Type II diabetes mellitus (HCC) 11/25/2019   Ingrown toenail 09/05/2018   Neck pain 01/29/2018   Tick bite 10/24/2017    PCP: [name], [name]  REFERRING PROVIDER:[name], MD  REFERRING DIAG: s/p right reverse TSA with complications  THERAPY DIAG:  Muscle weakness (generalized)  Stiffness of right shoulder, not elsewhere classified  Chronic right shoulder pain  Cervicalgia  Spasm  Acute pain of left shoulder  Rationale for Evaluation and Treatment: Rehabilitation  ONSET DATE: 01/01/24  SUBJECTIVE:  SUBJECTIVE STATEMENT:  Patient reports increase in neck and arm pain, just sleeping in different bed and cleaning up  Evaluation:  Patient had a fall on her deck, tripped on a board that was sticking up and had a severe fracture of the right proximal humerus, 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 We saw her in PT for many visits.  She had good progress but during a very busy time from Thanksgiving to [place] Years she started having pain and some regression of motion.  She has had x-rays, bone scan and a CT.  She saw  [name] after the CT.  Feels like it could be an infection, PT vs a revision.   11/30/23 Patient reports that she saw the surgeon she is frustrated and upset as she feels that she still has no answers, it is felt that there is no loosening and no infection.  She is still hurting with basic ADL's, her AROM decreased beginning in March, as she started having increased pain in mid January after a very busy and active few month, she has an order for the left shoulder and the okay for us  to see her for her right shoulder for another month PAIN:  Are you having pain? Yes: NPRS scale: 7/10 Pain location: right shoulder, HA, left shoulder pain up to 8/10 with motions Pain description: dull ache at rest, sharp with motions Aggravating factors: quick motions, movements pain up to 10/10 Relieving factors: ice, rest, Tylenol  at best 3/10  PRECAUTIONS: none  RED FLAGS: None   WEIGHT BEARING RESTRICTIONS: No  FALLS:  Has patient fallen in last 6 months? no  LIVING ENVIRONMENT: Lives with: lives alone Lives in: House/apartment Stairs: No Has following equipment at home: None  OCCUPATION: retired  PLOF: Independent, very active with grandkids, does 5 [facility] a [facility] [employer]  PATIENT GOALS:dress without difficulty, do hair, have good ROM and less pain  NEXT MD VISIT: Mid November  OBJECTIVE:   DIAGNOSTIC FINDINGS:  See above  PATIENT SURVEYS:    COGNITION: Overall cognitive status: Within functional limits for tasks assessed     SENSATION: WFL  POSTURE: Fwd head, rounded shoulders, elevated and guarded shoulder  UPPER EXTREMITY ROM:   Active ROM Right AROM  eval Left AROM Eval Right AROM 02/22/24 Right  AROM 03/20/24                  AROM 11/30/23  Shoulder flexion 105 125 112 118                  R 120  Shoulder extension                        Shoulder abduction 85 100 90 93                  Right 100  Shoulder adduction                        Shoulder internal  rotation 0 30  10                  Right 28  Shoulder external rotation 46 65  j                    Right 60  Elbow flexion                        Elbow extension                        Wrist flexion                        Wrist extension                        Wrist ulnar deviation                        Wrist radial deviation                        Wrist pronation                        Wrist supination                        (Blank rows = not tested)  UPPER EXTREMITY MMT:  In the available ROM  MMT Right eval Left eval  Shoulder flexion 4- 4-  Shoulder extension    Shoulder abduction    Shoulder adduction    Shoulder internal rotation 4+ 4+  Shoulder external rotation 4-P! 4  Middle trapezius    Lower trapezius    Elbow flexion    Elbow extension    Wrist flexion    Wrist extension    Wrist ulnar deviation    Wrist radial deviation    Wrist pronation    Wrist supination    Grip strength (lbs)    (Blank rows = not tested)  PALPATION:  Very tight and tender in the pectoral, upper trap, the entire right upper arm, slight warmth in the right anterior lateral arm  Positive impingement on the right   TODAY'S TREATMENT:                                                                                                                                          DATE:  04/30/24 UBE 6 minutes PROM bilateral shoulders all motions to her pain tolerance STM to the left upper arm, the neck, upper traps and the rhomboids Vaso to the left shoulder medium pressure 34 degrees this was performed after OT treatment  04/23/24 UBE x 4 minutes Passive stretch bilateral shoulders STM to the upper arms very tender deltoid and biceps STM to the neck, upper traps and rhomboids Vaso left shoulder  04/18/24 Passive stretch LE's Passive stretch UE's STM to the LE's STM  to the upper arms, the upper traps and the rhomboids and neck Vaso bilateral shoulders  04/16/24 Passive stretch LE's STM to LE's STM to the right upper trap, neck and rhomboid Passive stretch to the right shoulder Vaso right shoulder  04/11/24 Passive LE stretches STM to the LE's Passive ROM bilateral UE's STM to the right upper trap, neck, rhomboid and upper arm Right vaso 34 degrees  04/10/24 Passive stretch bilateral UE's Passive stretch LE's STM to the upper traps, neck and the bilateral UE's very tender today  04/04/24 Passive stretch of the LE's  Passive stretch of the UE's STM to the LE's STM to the upper traps and rhomboids Vaso to the left shoulder  04/03/24 Nustep level 4 x 5 minutes Passive stretch shoulders bilaterally STM to the neck, upper traps and rhomboids Vaso 34 degrees right shoulder  03/26/24 UBE L 2 each way Passive stretch bilateral UE's STM to the upper traps, neck and rhomboids, some MFR to knots in the upper traps Vaso 34 degrees  03/20/24 UBE level 5 x 6 minutes Passive stretch bilateral UE's, passive stretch LE's STM to the upper traps, neck and rhomboids, some MFR to knots in the upper traps Vaso 34 degrees  03/19/24 UBE level 4 x 6 minutes 20# row 20# lats 5# straight arm pulls 5# chest press 3#  serratus and isometric circles 2# wate bar OHP 2# wate bar extensions with PT assist Passive stretch LE's and the right shoulder all motions Vaso low pressure 34 degrees right shoulder  03/14/24 UBE level 4 x 6 minutes Rows 20# Lats 20# 2# wall slides and circles Passive stretch bilateral shoulders 3# wate bar chest press 3# wate bar flexion 5# small chest press, serratus press and isometric circles PNF supine bilateral shoulders Vaso right shoulder 34 degrees  03/12/24 Rows 20# 2x10 Lats 20# 2x10 Chest press 5# 2x10 UBE level 4 x 6 minutes 3# serratus 3# isometric circles 2# ER/IR Passive strength right shoulder all motions, joint distraction Vaso 34 degrees low pressure right shoulder  03/07/24 Nustep level 4 x 7 minutes 5# straight arm pulls Ball vs wall 4 positions 15# rows 15# lats Supine 3# isometric circles bilaterally 3# serratus bilaterally Passive stretches bilaterally UE's Vaso medium pressure 34 degrees  PATIENT EDUCATION: Education details: Dealer educated: Patient Education method: Programmer, Multimedia, Facilities Manager, Actor cues, Verbal cues, and Handouts Education comprehension: verbalized understanding  HOME EXERCISE PROGRAM:  ASSESSMENT:  CLINICAL IMPRESSION:  Patient with some increase upper arm and neck pain today, she is very tight and tender in the deltoids, the biceps, upper trap and neck mms.  We may switch and try to help with the left shoulder pain, strength and function.  She is planning on having the left shoulder injected January 14th and the right TSA revision 05/30/23   Patient reports that she is doing okay, she reports that she is trying to back off of some activities and break things down over time to ease the stress and strain thinks this is helping as she is making soup for the community and is doing this over a few days instead of all at one time..She has a lot of knots in the upper traps, neck and rhomboids, her ROM is improving.   She is having left shoulder pain as well due to it compensating so much, she has worries about this and needing the left shoulder to be strong so when she gets the right TSA she can have a good shoulder  Evaluation:  Patient is very frustrated, she had the right reverse TSA last July.  She did very well until around Christmas, then she really started having increased pain and lost some ROM.  She has had blood work done, US , bone scan and CT performed.  There has been some talk of a possible infection, MD feels like she may benefit from a revision.  She would like to try PT again and the MD agreed.  She has lost ROM since I last saw her, she also is having some significant left shoulder pain from over using it.   July 2025: Patient saw surgeon, the bone scan said did not feel that there was infection or loosening however the surgeon is not so sure, he wants to do an US  to be sure, that is scheduled for the next few weeks, since she has not been to PT in a month I re-measured her ROM, she has lost at least 15 degrees of motion and more for all motions.  She is also having more pain in the left shoulder possibly from overdoing it, because she is trying to not use the right shoulder.    OBJECTIVE IMPAIRMENTS: cardiopulmonary status limiting activity, decreased activity tolerance, decreased endurance, decreased ROM, decreased strength, increased edema, increased muscle spasms, impaired flexibility, impaired UE functional use, improper body mechanics, postural dysfunction, and pain.  REHAB POTENTIAL: Good  CLINICAL DECISION MAKING: Evolving/moderate complexity  EVALUATION COMPLEXITY: Low   GOALS: Goals reviewed with patient? Yes  SHORT TERM GOALS: Target date: 01/31/24  Independent with initial HEP Goal status: met 01/31/24  LONG TERM GOALS: Target date: 04/15/24  Decrease pain 50% Goal status: progressing 03/20/24 and 03/26/24, 04/11/24 2.  Dress without difficulty Goal status: progressing  03/20/24 and 03/26/24, 04/11/24 3.  Do hair without difficulty Goal status: progressing 03/14/24 and 03/26/24 4.  Increase AROM right shoulder flexion to 130 degrees Goal status:  5.  Increase right shoulder ER to 60 degrees Goal status: progressing 03/14/24, 04/18/24  6.  Return to water  aerobics and or gym activity Goal status: on going 02/26/24  and 03/26/24 progressing 04/30/24  PLAN:  PT FREQUENCY: 1-2x/week  PT DURATION: 12 weeks  PLANNED INTERVENTIONS: Therapeutic exercises, Therapeutic activity, Neuromuscular re-education, Balance training, Gait training, Patient/Family education, Self Care, Joint mobilization, Dry Needling, Electrical stimulation, Cryotherapy, Vasopneumatic device, and Manual therapy  PLAN FOR NEXT SESSION: Try to regain some of her ROM and function, treat pain as needed, address the left shoulder as well  04/30/2024, 7:56 AM [place] [facility] at [facility]. [place], [place], [address] Phone: [phone]   Fax:  [phone] "

## 2024-05-06 ENCOUNTER — Encounter: Payer: Self-pay | Admitting: Family Medicine

## 2024-05-06 MED ORDER — SYNJARDY XR 12.5-1000 MG PO TB24: 1.0000 | ORAL_TABLET | Freq: Two times a day (BID) | ORAL | 3 refills | Status: AC

## 2024-05-06 MED ORDER — SEMAGLUTIDE (2 MG/DOSE) 8 MG/3ML ~~LOC~~ SOPN: 2.0000 mg | PEN_INJECTOR | SUBCUTANEOUS | 3 refills | Status: AC

## 2024-05-07 ENCOUNTER — Other Ambulatory Visit (HOSPITAL_BASED_OUTPATIENT_CLINIC_OR_DEPARTMENT_OTHER): Payer: Self-pay | Admitting: Family Medicine

## 2024-05-07 ENCOUNTER — Ambulatory Visit: Attending: Orthopaedic Surgery | Admitting: Physical Therapy

## 2024-05-07 ENCOUNTER — Encounter: Payer: Self-pay | Admitting: Physical Therapy

## 2024-05-07 DIAGNOSIS — M6281 Muscle weakness (generalized): Secondary | ICD-10-CM | POA: Diagnosis present

## 2024-05-07 DIAGNOSIS — M25511 Pain in right shoulder: Secondary | ICD-10-CM | POA: Insufficient documentation

## 2024-05-07 DIAGNOSIS — M25642 Stiffness of left hand, not elsewhere classified: Secondary | ICD-10-CM | POA: Diagnosis present

## 2024-05-07 DIAGNOSIS — M25512 Pain in left shoulder: Secondary | ICD-10-CM | POA: Insufficient documentation

## 2024-05-07 DIAGNOSIS — M79642 Pain in left hand: Secondary | ICD-10-CM | POA: Insufficient documentation

## 2024-05-07 DIAGNOSIS — M25611 Stiffness of right shoulder, not elsewhere classified: Secondary | ICD-10-CM | POA: Insufficient documentation

## 2024-05-07 DIAGNOSIS — R278 Other lack of coordination: Secondary | ICD-10-CM | POA: Diagnosis present

## 2024-05-07 DIAGNOSIS — M25641 Stiffness of right hand, not elsewhere classified: Secondary | ICD-10-CM | POA: Diagnosis present

## 2024-05-07 DIAGNOSIS — M542 Cervicalgia: Secondary | ICD-10-CM | POA: Insufficient documentation

## 2024-05-07 DIAGNOSIS — M79641 Pain in right hand: Secondary | ICD-10-CM | POA: Diagnosis present

## 2024-05-07 DIAGNOSIS — Z1231 Encounter for screening mammogram for malignant neoplasm of breast: Secondary | ICD-10-CM

## 2024-05-07 DIAGNOSIS — R252 Cramp and spasm: Secondary | ICD-10-CM | POA: Diagnosis present

## 2024-05-07 DIAGNOSIS — G8929 Other chronic pain: Secondary | ICD-10-CM | POA: Diagnosis present

## 2024-05-07 NOTE — Therapy (Signed)
 " OUTPATIENT PHYSICAL THERAPY SHOULDER    Patient Name: Veronica Galvan MRN: [id number] DOB:April 28, 1956, 69 y.o., female Today's Date: 05/07/2024  END OF SESSION:  PT End of Session - 05/07/24 0845     Visit Number 27    Date for Recertification  05/17/24    Authorization Type [employer] [place]    PT Start Time [phone]    PT Stop Time 0950    PT Time Calculation (min) 66 min    Activity Tolerance Patient tolerated treatment well    Behavior During Therapy WFL for tasks assessed/performed           Past Medical History:  Diagnosis Date   Allergy     Anxiety    Arthritis    hands   Cataract    Diabetes mellitus without complication (HCC)    type 2   Family history of adverse reaction to anesthesia    son has malignant hyperthermia, daughter does not daughter recently had c section without problems   GERD (gastroesophageal reflux disease)    Headache    sinus   Hyperlipidemia    Hypertension    PONV (postoperative nausea and vomiting)    nausea only   Ulcer, stomach peptic yrs ago   Past Surgical History:  Procedure Laterality Date   APPENDECTOMY     both hells bone spur repair     both heels with metal clips   both shoulder rotator cuff repair     CESAREAN SECTION     x 1   COLONOSCOPY WITH PROPOFOL  N/A 09/07/2016   Procedure: COLONOSCOPY WITH PROPOFOL ;  Surgeon: Vicci Gladis POUR, MD;  Location: [facility];  Service: Endoscopy;  Laterality: N/A;   colonscopy  06/2011   polyps   ELBOW FRACTURE SURGERY Left    EYE SURGERY     FRACTURE SURGERY     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/10/2022   Procedure: REVERSE SHOULDER ARTHROPLASTY;  Surgeon: Melita Drivers, MD;  Location: [facility];  Service: Orthopedics;  Laterality: Right;  Please follow in room 6 if able   TUBAL LIGATION     VESICO-VAGINAL FISTULA REPAIR     Patient Active Problem List   Diagnosis Date Noted   Aortic atherosclerosis 02/07/2024   Chronic right shoulder pain 02/07/2024   Gastroesophageal  reflux disease 11/16/2021   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 08/18/2021   Dermatofibroma 08/18/2021   History of malignant neoplasm of skin 08/18/2021   Lentigo 08/18/2021   Melanocytic nevi of trunk 08/18/2021   Nevus lipomatosus cutaneous superficialis 08/18/2021   Rosacea 08/18/2021   Actinic keratosis 08/18/2021   Sensorineural hearing loss (SNHL) of left ear with unrestricted hearing of right ear 07/26/2021   Tinnitus of left ear 07/26/2021   Acute recurrent maxillary sinusitis 06/22/2021   Primary hypertension 01/12/2021   Hyperlipidemia associated with type 2 diabetes mellitus (HCC) 01/12/2021   Arthritis 01/12/2021   Type II diabetes mellitus (HCC) 11/25/2019   Ingrown toenail 09/05/2018   Neck pain 01/29/2018   Tick bite 10/24/2017    PCP: [name], [name]  REFERRING PROVIDER:[name], MD  REFERRING DIAG: s/p right reverse TSA with complications  THERAPY DIAG:  Muscle weakness (generalized)  Stiffness of right shoulder, not elsewhere classified  Chronic right shoulder pain  Cervicalgia  Acute pain of left shoulder  Spasm  Rationale for Evaluation and Treatment: Rehabilitation  ONSET DATE: 01/01/24  SUBJECTIVE:  SUBJECTIVE STATEMENT:  Patient reports pain in the left shoulder much more with stiffness and tightness  Evaluation:  Patient had a fall on her deck, tripped on a board that was sticking up and had a severe fracture of the right proximal humerus, 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 We saw her in PT for many visits.  She had good progress but during a very busy time from Thanksgiving to [place] Years she started having pain and some regression of motion.  She has had x-rays, bone scan and a CT.  She saw [name] after the CT.  Feels  like it could be an infection, PT vs a revision.   11/30/23 Patient reports that she saw the surgeon she is frustrated and upset as she feels that she still has no answers, it is felt that there is no loosening and no infection.  She is still hurting with basic ADL's, her AROM decreased beginning in March, as she started having increased pain in mid January after a very busy and active few month, she has an order for the left shoulder and the okay for us  to see her for her right shoulder for another month PAIN:  Are you having pain? Yes: NPRS scale: 7/10 Pain location: right shoulder, HA, left shoulder pain up to 8/10 with motions Pain description: dull ache at rest, sharp with motions Aggravating factors: quick motions, movements pain up to 10/10 Relieving factors: ice, rest, Tylenol  at best 3/10  PRECAUTIONS: none  RED FLAGS: None   WEIGHT BEARING RESTRICTIONS: No  FALLS:  Has patient fallen in last 6 months? no  LIVING ENVIRONMENT: Lives with: lives alone Lives in: House/apartment Stairs: No Has following equipment at home: None  OCCUPATION: retired  PLOF: Independent, very active with grandkids, does 5 classes a week [employer]  PATIENT GOALS:dress without difficulty, do hair, have good ROM and less pain  NEXT MD VISIT: Mid November  OBJECTIVE:   DIAGNOSTIC FINDINGS:  See above  PATIENT SURVEYS:    COGNITION: Overall cognitive status: Within functional limits for tasks assessed     SENSATION: WFL  POSTURE: Fwd head, rounded shoulders, elevated and guarded shoulder  UPPER EXTREMITY ROM:   Active ROM Right AROM  eval Left AROM Eval Right AROM 02/22/24 Right  AROM 03/20/24                  AROM 11/30/23  Shoulder flexion 105 125 112 118                  R 120  Shoulder extension                        Shoulder abduction 85 100 90 93                  Right 100  Shoulder adduction                        Shoulder internal rotation 0 30  10                   Right 28  Shoulder external rotation 46 65  j                    Right 60  Elbow flexion                        Elbow extension                        Wrist flexion                        Wrist extension                        Wrist ulnar deviation                        Wrist radial deviation                        Wrist pronation                        Wrist supination                        (Blank rows = not tested)  UPPER EXTREMITY MMT:  In the available ROM  MMT Right eval Left eval  Shoulder flexion 4- 4-  Shoulder extension    Shoulder abduction    Shoulder adduction    Shoulder internal rotation 4+ 4+  Shoulder external rotation 4-P! 4  Middle trapezius    Lower trapezius    Elbow flexion    Elbow extension    Wrist flexion    Wrist extension    Wrist ulnar deviation    Wrist radial deviation    Wrist pronation    Wrist supination    Grip strength (lbs)    (Blank rows = not tested)  PALPATION:  Very tight and tender in the pectoral, upper trap, the entire right upper arm, slight warmth in the right anterior lateral arm  Positive impingement on the right   TODAY'S TREATMENT:                                                                                                                                          DATE:  05/07/24 UBE level 3 x 5 minutes Red tband left shoulder ER/IR Red tband left shoulder row and extension Wall slides both arms Shrugs PROM bilateral shoulders STM to the left shoulder upper arm and bilateral upper traps and neck Vaso 34 degrees both shoulders   04/30/24 UBE 6 minutes PROM bilateral shoulders all motions to her pain tolerance STM to the left upper arm, the neck, upper traps and the rhomboids Vaso to the left shoulder medium pressure 34 degrees this was performed  after OT treatment  04/23/24 UBE x 4 minutes Passive stretch bilateral shoulders STM to the upper arms very tender deltoid and biceps STM to the neck, upper traps and rhomboids Vaso left shoulder  04/18/24 Passive stretch LE's Passive stretch UE's STM to the LE's STM to the upper arms, the upper traps and the rhomboids and neck Vaso bilateral shoulders  04/16/24 Passive stretch LE's STM to LE's STM to the right upper trap, neck and rhomboid Passive stretch to the right shoulder Vaso right shoulder  04/11/24 Passive LE stretches STM to the LE's Passive ROM bilateral UE's STM to the right upper trap, neck, rhomboid and upper arm Right vaso 34 degrees  04/10/24 Passive stretch bilateral UE's Passive stretch LE's STM to the upper traps, neck and the bilateral UE's very tender today  04/04/24 Passive stretch of the LE's  Passive stretch of the UE's STM to the LE's STM to the upper traps and rhomboids Vaso to the left shoulder  04/03/24 Nustep level 4 x 5 minutes Passive stretch shoulders bilaterally STM to the neck, upper traps and rhomboids Vaso 34 degrees right shoulder  03/26/24 UBE L 2 each way Passive stretch bilateral UE's STM to the upper traps, neck and rhomboids, some MFR to knots in the upper traps Vaso 34 degrees  03/20/24 UBE level 5 x 6 minutes Passive stretch  bilateral UE's, passive stretch LE's STM to the upper traps, neck and rhomboids, some MFR to knots in the upper traps Vaso 34 degrees  03/19/24 UBE level 4 x 6 minutes 20# row 20# lats 5# straight arm pulls 5# chest press 3# serratus and isometric circles 2# wate bar OHP 2# wate bar extensions with PT assist Passive stretch LE's and the right shoulder all motions Vaso low pressure 34 degrees right shoulder  03/14/24 UBE level 4 x 6 minutes Rows 20# Lats 20# 2# wall slides and circles Passive stretch bilateral shoulders 3# wate bar chest press 3# wate bar flexion 5# small chest press, serratus press and isometric circles PNF supine bilateral shoulders Vaso right shoulder 34 degrees  PATIENT EDUCATION: Education details: poc Person educated: Patient Education method: Programmer, Multimedia, Facilities Manager, Actor cues, Verbal cues, and Handouts Education comprehension: verbalized understanding  HOME EXERCISE PROGRAM:  ASSESSMENT:  CLINICAL IMPRESSION:  Patient with some increase upper arm and neck pain today, she c/o more pain in the left shoulder than the right today, her right TSA is scheduled for 05/29/24. I did a little more work on the left shoulder today due to the pain.  She is planning on having the left shoulder injected January 14th and the right TSA revision 05/30/23   Patient reports that she is doing okay, she reports that she is trying to back off of some activities and break things down over time to ease the stress and strain thinks this is helping as she is making soup for the community and is doing this over a few days instead of all at one time..She has a lot of knots in the upper traps, neck and rhomboids, her ROM is improving.  She is having left shoulder pain as well due to it compensating so much, she has worries about this and needing the left shoulder to be strong so when she gets the right TSA she can have a good shoulder  Evaluation:  Patient is very frustrated,  she had the right reverse TSA last July.  She did very well until around Christmas, then she really started having increased pain and  lost some ROM.  She has had blood work done, US , bone scan and CT performed.  There has been some talk of a possible infection, MD feels like she may benefit from a revision.  She would like to try PT again and the MD agreed.  She has lost ROM since I last saw her, she also is having some significant left shoulder pain from over using it.   July 2025: Patient saw surgeon, the bone scan said did not feel that there was infection or loosening however the surgeon is not so sure, he wants to do an US  to be sure, that is scheduled for the next few weeks, since she has not been to PT in a month I re-measured her ROM, she has lost at least 15 degrees of motion and more for all motions.  She is also having more pain in the left shoulder possibly from overdoing it, because she is trying to not use the right shoulder.    OBJECTIVE IMPAIRMENTS: cardiopulmonary status limiting activity, decreased activity tolerance, decreased endurance, decreased ROM, decreased strength, increased edema, increased muscle spasms, impaired flexibility, impaired UE functional use, improper body mechanics, postural dysfunction, and pain.  REHAB POTENTIAL: Good  CLINICAL DECISION MAKING: Evolving/moderate complexity  EVALUATION COMPLEXITY: Low   GOALS: Goals reviewed with patient? Yes  SHORT TERM GOALS: Target date: 01/31/24  Independent with initial HEP Goal status: met 01/31/24  LONG TERM GOALS: Target date: 04/15/24  Decrease pain 50% Goal status: progressing 03/20/24 and 03/26/24, 04/11/24 2.  Dress without difficulty Goal status: progressing 03/20/24 and 03/26/24, met 05/07/24 3.  Do hair without difficulty Goal status: progressing 03/14/24 and 03/26/24 4.  Increase AROM right shoulder flexion to 130 degrees Goal status:  5.  Increase right shoulder ER to 60 degrees Goal status:  progressing 03/14/24, 04/18/24  6.  Return to water  aerobics and or gym activity Goal status: on going 02/26/24  and 03/26/24 progressing 04/30/24  PLAN:  PT FREQUENCY: 1-2x/week  PT DURATION: 12 weeks  PLANNED INTERVENTIONS: Therapeutic exercises, Therapeutic activity, Neuromuscular re-education, Balance training, Gait training, Patient/Family education, Self Care, Joint mobilization, Dry Needling, Electrical stimulation, Cryotherapy, Vasopneumatic device, and Manual therapy  PLAN FOR NEXT SESSION: Try to regain some of her ROM and function, treat pain as needed, address the left shoulder as well  05/07/2024, 9:29 AM [facility] [place], [place], [address] Phone: [phone]   Fax:  [phone] "

## 2024-05-08 ENCOUNTER — Encounter: Payer: Self-pay | Admitting: Occupational Therapy

## 2024-05-08 ENCOUNTER — Ambulatory Visit: Admitting: Occupational Therapy

## 2024-05-08 DIAGNOSIS — M79641 Pain in right hand: Secondary | ICD-10-CM

## 2024-05-08 DIAGNOSIS — M6281 Muscle weakness (generalized): Secondary | ICD-10-CM | POA: Diagnosis not present

## 2024-05-08 DIAGNOSIS — M79642 Pain in left hand: Secondary | ICD-10-CM

## 2024-05-08 DIAGNOSIS — M25641 Stiffness of right hand, not elsewhere classified: Secondary | ICD-10-CM

## 2024-05-08 DIAGNOSIS — M25642 Stiffness of left hand, not elsewhere classified: Secondary | ICD-10-CM

## 2024-05-08 DIAGNOSIS — M25611 Stiffness of right shoulder, not elsewhere classified: Secondary | ICD-10-CM

## 2024-05-08 DIAGNOSIS — R278 Other lack of coordination: Secondary | ICD-10-CM

## 2024-05-08 NOTE — Therapy (Signed)
 " OUTPATIENT OCCUPATIONAL THERAPY ORTHO  treatment  Patient Name: Veronica Galvan MRN: [id number] DOB:May 12, 1955, 69 y.o., female Today's Date: 05/08/2024  PCP: Almarie Birmingham, NP REFERRING PROVIDER: Almarie Birmingham, NP  END OF SESSION:  OT End of Session - 05/08/24 1116     Visit Number 11    Number of Visits 12    Authorization Type [employer] MCR    Authorization Time Period 12 weeks    Authorization - Visit Number 11    Progress Note Due on Visit 20    OT Start Time 1104    OT Stop Time 1145    OT Time Calculation (min) 41 min    Activity Tolerance Patient tolerated treatment well    Behavior During Therapy WFL for tasks assessed/performed                 Past Medical History:  Diagnosis Date   Allergy     Anxiety    Arthritis    hands   Cataract    Diabetes mellitus without complication (HCC)    type 2   Family history of adverse reaction to anesthesia    son has malignant hyperthermia, daughter does not daughter recently had c section without problems   GERD (gastroesophageal reflux disease)    Headache    sinus   Hyperlipidemia    Hypertension    PONV (postoperative nausea and vomiting)    nausea only   Ulcer, stomach peptic yrs ago   Past Surgical History:  Procedure Laterality Date   APPENDECTOMY     both hells bone spur repair     both heels with metal clips   both shoulder rotator cuff repair     CESAREAN SECTION     x 1   COLONOSCOPY WITH PROPOFOL  N/A 09/07/2016   Procedure: COLONOSCOPY WITH PROPOFOL ;  Surgeon: Vicci Gladis POUR, MD;  Location: [facility];  Service: Endoscopy;  Laterality: N/A;   colonscopy  06/2011   polyps   ELBOW FRACTURE SURGERY Left    EYE SURGERY     FRACTURE SURGERY     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/10/2022   Procedure: REVERSE SHOULDER ARTHROPLASTY;  Surgeon: Melita Drivers, MD;  Location: [facility];  Service: Orthopedics;  Laterality: Right;  Please follow in room 6 if able   TUBAL LIGATION      VESICO-VAGINAL FISTULA REPAIR     Patient Active Problem List   Diagnosis Date Noted   Aortic atherosclerosis 02/07/2024   Chronic right shoulder pain 02/07/2024   Gastroesophageal reflux disease 11/16/2021   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 08/18/2021   Dermatofibroma 08/18/2021   History of malignant neoplasm of skin 08/18/2021   Lentigo 08/18/2021   Melanocytic nevi of trunk 08/18/2021   Nevus lipomatosus cutaneous superficialis 08/18/2021   Rosacea 08/18/2021   Actinic keratosis 08/18/2021   Sensorineural hearing loss (SNHL) of left ear with unrestricted hearing of right ear 07/26/2021   Tinnitus of left ear 07/26/2021   Acute recurrent maxillary sinusitis 06/22/2021   Primary hypertension 01/12/2021   Hyperlipidemia associated with type 2 diabetes mellitus (HCC) 01/12/2021   Arthritis 01/12/2021   Type II diabetes mellitus (HCC) 11/25/2019   Ingrown toenail 09/05/2018   Neck pain 01/29/2018   Tick bite 10/24/2017    ONSET DATE: 02/07/24  REFERRING DIAG: M19.90 (ICD-10-CM) - Arthritis      THERAPY DIAG:  Muscle weakness (generalized)  Stiffness of right shoulder, not elsewhere classified  Pain in right hand  Pain in  left hand  Other lack of coordination  Stiffness of left hand, not elsewhere classified  Stiffness of right hand, not elsewhere classified  Rationale for Evaluation and Treatment: Rehabilitation  SUBJECTIVE:   SUBJECTIVE STATEMENT: Pt reports her hands are painful today Pt accompanied by: self  PERTINENT HISTORY: 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 Pt was previously seen for OT and d/c 12/06/23  PRECAUTIONS: hx of R reverse toal shoulder, none for hands    WEIGHT BEARING RESTRICTIONS: No  PAIN:  Are you having pain? Yes: NPRS scale: 6-8/10 Pain location: bilateral hands Pain description: aching Aggravating  factors: overuse, waking up in pain Relieving factors: paraffin Pt has R shoulder pain 6-8/10, PT is addressing, OT will not address., FALLS: Has patient fallen in last 6 months? No  LIVING ENVIRONMENT: Lives with: lives alone Lives in: House/apartment   PLOF: Independent  PATIENT GOALS: decrease pain in hands, improve function/ mobility  NEXT MD VISIT: unknown  OBJECTIVE:  Note: Objective measures were completed at Evaluation unless otherwise noted.  HAND DOMINANCE: Right  ADLs:Pt reports increased overall difficulty with ADLs due to pain and stiffness. Pt is mod I with basic ADLs. She has difficulty with buttons, styling hair, opening jars.    FUNCTIONAL OUTCOME MEASURES: Upper Extremity Functional Scale (UEFS): 33/80  UPPER EXTREMITY ROM:   RUE composite finger flexion 70% (3cm from palm at middle finger)     LUE composite finer flexion 60% (3.5cm from palm at middle finger) bilateral UE's grossly 90-95% composite finger extension     HAND FUNCTION: Grip strength: Right: 18 lbs; Left: 15 lbs, Lateral pinch: Right: 10 lbs, Left: 12 lbs, and Tip pinch: Right 4 lbs, Left: 2 lbs  COORDINATION:NT   SENSATION: WFL  EDEMA: Pt with bony defomities at PIP joints of all digits which pt reports are bone spurs, Pt also has bony deformity at DIP for right thumb, index and 5th digit and LUE small and index fingers   COGNITION: Overall cognitive status: Within functional limits for tasks assessed   OBSERVATIONS: Pleasant female well known to therapist from previous occupational therapy at this site   TREATMENT DATE: 05/08/24-Paraffin to right hand x no adverse reactions while pt performed strengthening and fine motor activities with LUE. With OLZ:Iphp- flex light resistance, graded clothespins 1-8# for sustained pinch for individual finger flexion, placing and removing grooved pegs from pegboard using in hand manipulation, min-mod difficulty and v.c for positioning.   Paraffin to left hand x 15 mins no adverse reactions while pt performed strengthening and fine motor activities with LUE. With MLZ:Iphp- flex light resistance, graded clothespins 1-8# for sustained pinch for individual finger flexion, placing and removing grooved pegs from pegboard using in hand manipulation, min-mod difficulty and v.c for positioning.  Soft tissue/ joint mobs to hands, fingers  and wrists, forearms as well as P/ROM individual finger flexion, composite finger flexion to digits. Positioning/ joint protection for carrying water  cup.  04/30/24-Pt with increased hand pain after overuse this weekend washing dishes.  Paraffin to right hand for pain and stiffness 8 mins, no adverse reactions while therapist performed US  to left hand and digits 3.3 mhz, 0.8 w/cm 2, 20% x 8 mins no adverse reactions. Paraffin to left hand for pain/ stiffness x 8 mins, no adverse reactions while therapist performed US  to right hand and digits 3.3 mhz, 0.8 w/cm 2, 20% x 8 mins no adverse reactions. Soft  tissue/ joint mobs to hands, fingers  and wrists, forearms as well as P/ROM individual finger flexion, composite finger flexion to digits. Pt reports she purchased microwave heated mits for hands and they are working well  04/22/24-Pt with increased hand pain after overuse this weekend.  Paraffin to right hand for pain and stiffness 8 mins, no adverse reactions while therapist performed US  to left hand and digits 3.3 mhz, 0.8 w/cm 2, 20% x 8 mins no adverse reactions. Paraffin to left hand for pain/ stiffness x 8 mins, no adverse reactions while therapist performed US  to right hand and digits 3.3 mhz, 0.8 w/cm 2, 20% x 8 mins no adverse reactions. Soft tissue/ joint mobs to hands, fingers  and wrists, forarms as well as P/ROM composite finger flexion to digits. Discussed use of microwave hot mitts for managing hand pain while traveling or after shoulder surgery. Pt ordered them online.   04/16/24-Paraffin to  right hand for pain  and stiffness 8 mins, no adverse reactions while therapist performed US  to left hand and digits 3.3 mhz, 0.8 w/cm 2, 20% x 8 mins no adverse reactions. Paraffin to left hand for pain/ stiffness x 8 mins, no adverse reactions while therapist performed US  to right hand and digits 3.3 mhz, 0.8 w/cm 2, 20% x 8 mins no adverse reactions. Soft tissue/ joint mobs to hands, fingers  and wrists, forarms as well as P/ROM composite and individual finger flexion to digits. Pt performed grip strengthening with resistive tree for bilateral hands. Pt reports hands were looser at end of session    12/10/25Paraffin to right hand for pain  and stiffness 8 mins, no adverse reactions while therapist performed US  to left hand and digits 3.3 mhz, 0.8 w/cm 2, 20% x 8 mins no adverse reactions. Paraffin to left hand for pain/ stiffness x 8 mins, no adverse reactions while therapist performed US  to right hand and digits 3.3 mhz, 0.8 w/cm 2, 20% x 8 mins no adverse reactions. Soft tissue/ joint mobs to hands, fingers  and wrists as well as P/ROM composite and individual finger flexion to digits. Therapist checked pinch strength and ROM at end of session. Pt met pinch strength goals.  12/3/25paraffin to right hand for pain management x 8 mins, no adverse reactions while therapist performed US  to left hand and digits 3.3 mhz, 0.8 w/cm 2, 20% x 8 mins no adverse reactions. Paraffin to left hand for pain management x 8 mins, no adverse reactions while therapist performed US  to right hand and digits 3.3 mhz, 0.8 w/cm 2, 20% x 8 mins no adverse reactions. Soft tissue/ joint mobs to hands and wrists as well as P/ROM comosite finger flexion to digits.  11/18/25Paraffin to right hand for pain management x 8 mins, no adverse reactions while therapist performed US  to left hand and digits 3.3 mhz, 0.8 w/cm 2, 20% x 8 mins no adverse reactions. Paraffin to left hand for pain management x 8 mins, no adverse reactions  while therapist performed US  to right hand and digits 3.3 mhz, 0.8 w/cm 2, 20% x 8 mins no adverse reactions. Pt reports seeking assistance for cooking soup for church and receiving a padddle at therapist recommendation for joint protection when stirring. Therpist recommends pt coniders delgating the soup task to others in the future or that she seeks additional assist. araffin to right hand for pain management x 8 mins, no adverse reactions while therapist performed US  to left hand and digits 3.3 mhz, 0.8 w/cm 2, 20%  x 8 mins no adverse reactions. Paraffin to left hand for pain management x 8 mins, no adverse reactions while therapist performed US  to right hand and digits 3.3 mhz, 0.8 w/cm 2, 20% x 8 mins no adverse reactions. with lotion. P/ROM to digits in flexion individually and compositely. A/ROM finger thumb opposition as able to bilateral hands  11/12/24Paraffin to right hand for pain management x 8 mins, no adverse reactions while therapist performed US  to left hand and digits 3.3 mhz, 0.8 w/cm 2, 20% x 8 mins no adverse reactions. Paraffin to left hand for pain management x 8 mins, no adverse reactions while therapist performed US  to right hand and digits 3.3 mhz, 0.8 w/cm 2, 20% x 8 mins no adverse reactions. Pt reports making changes to her baking schedule and ordering a padddle at therapist recommendation for joint protection. Soft tissue and joint mobs to bilateral hands, wrist and forearms. P/ROM to digits in flexion individually and compositely. Pt demonstrates improved flexibility end of session.       03/05/24 Paraffin to right hand for pain management x 8 mins, no adverse reactions while therapist performed US  to left hand and digits 3.3 mhz, 0.8 w/cm 2, 20% x 8 mins no adverse reactions. Paraffin to left hand for pain management x 8 mins, no adverse reactions while therapist performed US  to right hand and digits 3.3 mhz, 0.8 w/cm 2, 20% x 8 mins no adverse reactions.P/ROM to digits  in flexion individually and compositely.discussed options for self care with pt's upcoming cooking activities: ie use of paddle, and suggestions for when pt has shoulder sugery.   Soft tissue and joint mobs to bilateral hands, wrist and forearms. Grip strengthening with stress tree for resisted grip  02/26/24 Paraffin to right hand for pain management x 8 mins, no adverse reactions while therapist perfromed US  to left hand and digits 3.3 mhz, 0.8 w/cm 2, 20% x 8 mins no adverse reactions. Paraffin to left hand for pain management x 8 mins, no adverse reactions while therapist performed US  to right hand and digits 3.3 mhz, 0.8 w/cm 2, 20% x 8 mins no adverse reactions.P/ROM to digits in flexion  individually and compositely.  Soft tissue and joint mobs to bilateral hands, wrist and forearms.  Discussed improtance of balancing activity and rest in order to management pain and prevent injury.  02/21/24- eval Paraffin to bilateral hands and wrists for pain management x 8 mins, no adverse reactions. P/ROM to digits in flexion  individually and compositely soft tissue mobs to bilateral hands. Pt reports decreased pain end of session.      PATIENT EDUCATION: Education details: see above Person educated: Patient Education method: Explanation Education comprehension: verbalized understanding  HOME EXERCISE PROGRAM: n/a  GOALS: Goals reviewed with patient? Yes  SHORT TERM GOALS: Target date: 03/23/24  I with inital HEP.  Goal status:  ongoing 05/08/24  2.    Pt will improve RUE tip pinch by 2 lbs(from baseline) for increased ease with daily activities. Revised goal:Pt will improve RUE tip pinch to 7 lbs for increased ease with daily activities.                               baseline: RUE tip 4lbs, LUE tip 2 lbs                          Goal status: met 6 lbs 04/10/24,  continue with revised goal  3.  Pt will improve LUE tip pinch by 2 lbs(from baseline) for increased ease with daily  activities. Revised goal:Pt will improve LUE tip pinch to 5 lbs for increased ease with daily activities. baseline: RUE tip 4lbs, LUE tip 2 lbs Goal status:  met 4lbs 04/10/24, continue with revised goal  4.  I with adapted strategies/ adapted equipment to minimize pain and to increase pt I with ADLs/IADLs   Goal status: ongoing, pt has ordered a paddle to use for stirring soup, hot mitts ordered for pain management 04/30/24  5.  Pt will report bilateral hand pain no greater than 4/10 for ADLs/IADLS Baseline: RUE 7/10, LUE 6/10 Goal status: ongoing 03/13/24- pain continues to improve 4/10 today 03/19/24, 6-9/10 today  -04/30/24      LONG TERM GOALS: Target date: 05/15/24      Pt will improve RUE grip strength to 22 # or greater for increased functional use Baseline: RUE 18# Goal status:  ongoing 20# 04/30/24  2.  Pt will improve LUE grip strength to 25 # or greater for increased functional use Baseline: LUE 21# Goal status:  ongoing 17# pts shoulder is painful 04/30/24   3.Pt will demonstrate improved composite finger flexion for ADLs as evidenced by pt. bringing middle fingertip for RUE within 2.75 cm of palm.             Baseline: 3 cm from palm to middle fingertip             Goal status: met today,  pt demonstrates 2 cm from palm today, met 04/10/24- will monitor for consistency   4. .Pt will demonstrate improved composite finger flexion for ADLs as evidenced by pt. bringing middle fingertip for LUE within 3.25 of palm.             Baseline: 3.5 cm from palm to middle fingertip             Goal status: met today, pt demonstrates 2 cm from palm today, met 04/10/24- will monitor for consistency    5. check 9 hole peg test and set goals prn- deferred 04/10/24 ASSESSMENT:  CLINICAL IMPRESSION: Pt is progressing towards goals. She reports difficulty opening plastic bags at home. Pt continues to demonstrate impairments in bilateral hand strength and coordination. She can benefit  from ongoing OT to address these deficits.    SABRAPERFORMANCE DEFICITS: in functional skills including ADLs, IADLs, coordination, dexterity, proprioception, sensation, edema, ROM, strength, pain, flexibility, Fine motor control, Gross motor control, endurance, decreased knowledge of precautions, decreased knowledge of use of DME, and UE functional use,and psychosocial skills including coping strategies, environmental adaptation, habits, interpersonal interactions, and routines and behaviors.   IMPAIRMENTS: are limiting patient from ADLs, IADLs, rest and sleep, play, leisure, and social participation.   COMORBIDITIES: may have co-morbidities  that affects occupational performance. Patient will benefit from skilled OT to address above impairments and improve overall function.  MODIFICATION OR ASSISTANCE TO COMPLETE EVALUATION: No modification of tasks or assist necessary to complete an evaluation.  OT OCCUPATIONAL PROFILE AND HISTORY: Detailed assessment: Review of records and additional review of physical, cognitive, psychosocial history related to current functional performance.  CLINICAL DECISION MAKING: LOW - limited treatment options, no task modification necessary  REHAB POTENTIAL: Good  EVALUATION COMPLEXITY: Low      PLAN:  OT FREQUENCY: 1-2x/week  OT DURATION: 12 weeks  PLANNED INTERVENTIONS: 97168 OT Re-evaluation, 97535 self care/ADL training, 02889 therapeutic exercise, 97530 therapeutic activity, 97112  neuromuscular re-education, 97140 manual therapy, 97035 ultrasound, 02981 paraffin, 97010 moist heat, 97010 cryotherapy, 97014 electrical stimulation unattended, 97760 Orthotic Initial, 97763 Orthotic/Prosthetic subsequent, scar mobilization, passive range of motion, functional mobility training, energy conservation, coping strategies training, patient/family education, and DME and/or AE instructions  RECOMMENDED OTHER SERVICES: n/a  CONSULTED AND AGREED WITH PLAN OF CARE:  Patient  PLAN FOR NEXT SESSION:,  check goals and renew   Anniah Glick, OT 05/08/2024, 11:25 AM   "

## 2024-05-09 ENCOUNTER — Ambulatory Visit: Admitting: Physical Therapy

## 2024-05-09 ENCOUNTER — Encounter: Payer: Self-pay | Admitting: Physical Therapy

## 2024-05-09 DIAGNOSIS — M25512 Pain in left shoulder: Secondary | ICD-10-CM

## 2024-05-09 DIAGNOSIS — M6281 Muscle weakness (generalized): Secondary | ICD-10-CM | POA: Diagnosis not present

## 2024-05-09 DIAGNOSIS — G8929 Other chronic pain: Secondary | ICD-10-CM

## 2024-05-09 DIAGNOSIS — M25611 Stiffness of right shoulder, not elsewhere classified: Secondary | ICD-10-CM

## 2024-05-09 DIAGNOSIS — M542 Cervicalgia: Secondary | ICD-10-CM

## 2024-05-09 NOTE — Therapy (Signed)
 " OUTPATIENT PHYSICAL THERAPY SHOULDER    Patient Name: Veronica Galvan MRN: [id number] DOB:May 28, 1955, 69 y.o., female Today's Date: 05/09/2024  END OF SESSION:  PT End of Session - 05/09/24 0845     Visit Number 28    Date for Recertification  05/17/24    Authorization Type [employer] [place]    PT Start Time [phone]    PT Stop Time 0945    PT Time Calculation (min) 61 min    Activity Tolerance Patient tolerated treatment well    Behavior During Therapy WFL for tasks assessed/performed           Past Medical History:  Diagnosis Date   Allergy     Anxiety    Arthritis    hands   Cataract    Diabetes mellitus without complication (HCC)    type 2   Family history of adverse reaction to anesthesia    son has malignant hyperthermia, daughter does not daughter recently had c section without problems   GERD (gastroesophageal reflux disease)    Headache    sinus   Hyperlipidemia    Hypertension    PONV (postoperative nausea and vomiting)    nausea only   Ulcer, stomach peptic yrs ago   Past Surgical History:  Procedure Laterality Date   APPENDECTOMY     both hells bone spur repair     both heels with metal clips   both shoulder rotator cuff repair     CESAREAN SECTION     x 1   COLONOSCOPY WITH PROPOFOL  N/A 09/07/2016   Procedure: COLONOSCOPY WITH PROPOFOL ;  Surgeon: Vicci Gladis POUR, MD;  Location: [facility];  Service: Endoscopy;  Laterality: N/A;   colonscopy  06/2011   polyps   ELBOW FRACTURE SURGERY Left    EYE SURGERY     FRACTURE SURGERY     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/10/2022   Procedure: REVERSE SHOULDER ARTHROPLASTY;  Surgeon: Melita Drivers, MD;  Location: [facility];  Service: Orthopedics;  Laterality: Right;  Please follow in room 6 if able   TUBAL LIGATION     VESICO-VAGINAL FISTULA REPAIR     Patient Active Problem List   Diagnosis Date Noted   Aortic atherosclerosis 02/07/2024   Chronic right shoulder pain 02/07/2024   Gastroesophageal  reflux disease 11/16/2021   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 08/18/2021   Dermatofibroma 08/18/2021   History of malignant neoplasm of skin 08/18/2021   Lentigo 08/18/2021   Melanocytic nevi of trunk 08/18/2021   Nevus lipomatosus cutaneous superficialis 08/18/2021   Rosacea 08/18/2021   Actinic keratosis 08/18/2021   Sensorineural hearing loss (SNHL) of left ear with unrestricted hearing of right ear 07/26/2021   Tinnitus of left ear 07/26/2021   Acute recurrent maxillary sinusitis 06/22/2021   Primary hypertension 01/12/2021   Hyperlipidemia associated with type 2 diabetes mellitus (HCC) 01/12/2021   Arthritis 01/12/2021   Type II diabetes mellitus (HCC) 11/25/2019   Ingrown toenail 09/05/2018   Neck pain 01/29/2018   Tick bite 10/24/2017    PCP: [name], [name]  REFERRING PROVIDER:[name], MD  REFERRING DIAG: s/p right reverse TSA with complications  THERAPY DIAG:  Muscle weakness (generalized)  Stiffness of right shoulder, not elsewhere classified  Chronic right shoulder pain  Cervicalgia  Acute pain of left shoulder  Rationale for Evaluation and Treatment: Rehabilitation  ONSET DATE: 01/01/24  SUBJECTIVE:  SUBJECTIVE STATEMENT:  Patient continues to have left shoulder pain.    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 We saw her in PT for many visits.  She had good progress but during a very busy time from Thanksgiving to [place] Years she started having pain and some regression of motion.  She has had x-rays, bone scan and a CT.  She saw [name] after the CT.  Feels like it could be an infection, PT vs a  revision.   11/30/23 Patient reports that she saw the surgeon she is frustrated and upset as she feels that she still has no answers, it is felt that there is no loosening and no infection.  She is still hurting with basic ADL's, her AROM decreased beginning in March, as she started having increased pain in mid January after a very busy and active few month, she has an order for the left shoulder and the okay for us  to see her for her right shoulder for another month PAIN:  Are you having pain? Yes: NPRS scale: 7/10 Pain location: right shoulder, HA, left shoulder pain up to 8/10 with motions Pain description: dull ache at rest, sharp with motions Aggravating factors: quick motions, movements pain up to 10/10 Relieving factors: ice, rest, Tylenol  at best 3/10  PRECAUTIONS: none  RED FLAGS: None   WEIGHT BEARING RESTRICTIONS: No  FALLS:  Has patient fallen in last 6 months? no  LIVING ENVIRONMENT: Lives with: lives alone Lives in: House/apartment Stairs: No Has following equipment at home: None  OCCUPATION: retired  PLOF: Independent, very active with grandkids, does 5 [facility] a [facility] [employer]  PATIENT GOALS:dress without difficulty, do hair, have good ROM and less pain  NEXT MD VISIT: Mid November  OBJECTIVE:   DIAGNOSTIC FINDINGS:  See above  PATIENT SURVEYS:    COGNITION: Overall cognitive status: Within functional limits for tasks assessed     SENSATION: WFL  POSTURE: Fwd head, rounded shoulders, elevated and guarded shoulder  UPPER EXTREMITY ROM:   Active ROM Right AROM  eval Left AROM Eval Right AROM 02/22/24 Right  AROM 03/20/24                  AROM 11/30/23  Shoulder flexion 105 125 112 118                  R 120  Shoulder extension                        Shoulder abduction 85 100 90 93                  Right 100  Shoulder adduction                        Shoulder internal rotation 0 30  10                  Right 28  Shoulder external  rotation 46 65  j                    Right 60  Elbow flexion                        Elbow extension                        Wrist flexion                        Wrist extension                        Wrist ulnar deviation                        Wrist radial deviation                        Wrist pronation                        Wrist supination                        (Blank rows = not tested)  UPPER EXTREMITY MMT:  In the available ROM  MMT Right eval Left eval  Shoulder flexion 4- 4-  Shoulder extension    Shoulder abduction    Shoulder adduction    Shoulder internal rotation 4+ 4+  Shoulder external rotation 4-P! 4  Middle trapezius    Lower trapezius    Elbow flexion    Elbow extension    Wrist flexion    Wrist extension    Wrist ulnar deviation    Wrist radial deviation    Wrist pronation    Wrist supination    Grip strength (lbs)    (Blank rows = not tested)  PALPATION:  Very tight and tender in the pectoral, upper trap, the entire right upper arm, slight warmth in the right anterior lateral arm  Positive impingement on the right   TODAY'S TREATMENT:                                                                                                                                          DATE:  05/09/24 UBE level 3 x 5 minutes Wall slides and circles  Red tband row and extension Red tband ER/IR PROM bilateral shoulders STM to the left upper arm, upper trap and neck Vaso left shoulder   05/07/24 UBE level 3 x 5 minutes Red tband left shoulder ER/IR Red tband left shoulder row and extension Wall slides both arms Shrugs PROM bilateral shoulders STM to the left shoulder upper arm and bilateral upper traps and neck Vaso 34 degrees both shoulders  04/30/24 UBE 6 minutes PROM bilateral shoulders all motions to her pain tolerance STM to the left upper arm, the neck, upper traps and the rhomboids Vaso to the left shoulder medium pressure 34 degrees this was performed after OT treatment  04/23/24 UBE x 4 minutes Passive stretch bilateral shoulders STM to the upper arms very tender deltoid and biceps STM to the neck, upper traps and rhomboids Vaso left shoulder  04/18/24 Passive stretch LE's Passive stretch UE's STM to the LE's STM to the upper arms, the upper traps and the rhomboids and neck Vaso bilateral shoulders  04/16/24 Passive stretch LE's STM to LE's STM to the right upper trap, neck and rhomboid Passive stretch to the right shoulder Vaso right shoulder  04/11/24 Passive LE stretches STM to the LE's Passive ROM bilateral UE's STM to the right upper trap, neck, rhomboid and upper arm Right vaso 34 degrees  04/10/24 Passive stretch bilateral UE's Passive stretch LE's STM to the upper traps, neck and the bilateral UE's very tender today  04/04/24 Passive stretch of the LE's  Passive stretch of the UE's STM to the LE's STM to the upper traps and rhomboids Vaso to the left shoulder  04/03/24 Nustep level 4 x 5 minutes Passive stretch shoulders bilaterally STM to the neck, upper traps and rhomboids Vaso 34 degrees right shoulder  03/26/24 UBE L 2 each way Passive stretch  bilateral UE's STM to the upper traps, neck and rhomboids, some MFR to knots in the upper traps Vaso 34 degrees  03/20/24 UBE level 5 x 6 minutes Passive stretch bilateral UE's, passive stretch LE's STM to the upper traps, neck and rhomboids, some MFR to knots in the upper traps Vaso 34 degrees  03/19/24 UBE level 4 x 6 minutes 20# row 20# lats 5# straight arm pulls 5# chest press 3# serratus and isometric circles 2# wate bar OHP 2# wate bar extensions with PT assist Passive stretch LE's and the right shoulder all motions Vaso low pressure 34 degrees right shoulder  03/14/24 UBE level 4 x 6 minutes Rows 20# Lats 20# 2# wall slides and circles Passive stretch bilateral shoulders 3# wate bar chest press 3# wate bar flexion 5# small chest press, serratus press and isometric circles PNF supine bilateral shoulders Vaso right shoulder 34 degrees  PATIENT EDUCATION: Education details: poc Person educated: Patient Education method: Programmer, Multimedia, Facilities Manager, Actor cues, Verbal cues, and Handouts Education comprehension: verbalized understanding  HOME EXERCISE PROGRAM:  ASSESSMENT:  CLINICAL IMPRESSION:  Patient with some increase upper arm and neck pain today, she c/o more pain in the left shoulder than the right today, her right TSA is scheduled for 05/29/24. I did a little more work on the left shoulder today due to the pain.  She is planning on having the left shoulder injected January 14th and the right TSA revision 05/30/23   Patient reports that she is doing okay, she reports that she is trying to back off of some activities and break things down over time to ease the stress and strain thinks this is helping as she is making soup for the community and is doing this over a few days instead of all at one time..She has a lot of knots in the upper traps, neck and rhomboids, her ROM is improving.  She is having left shoulder pain as well due to it compensating so much, she  has worries about this and needing the left shoulder to be strong so when she gets the right  TSA she can have a good shoulder  Evaluation:  Patient is very frustrated, she had the right reverse TSA last July.  She did very well until around Christmas, then she really started having increased pain and lost some ROM.  She has had blood work done, US , bone scan and CT performed.  There has been some talk of a possible infection, MD feels like she may benefit from a revision.  She would like to try PT again and the MD agreed.  She has lost ROM since I last saw her, she also is having some significant left shoulder pain from over using it.   July 2025: Patient saw surgeon, the bone scan said did not feel that there was infection or loosening however the surgeon is not so sure, he wants to do an US  to be sure, that is scheduled for the next few weeks, since she has not been to PT in a month I re-measured her ROM, she has lost at least 15 degrees of motion and more for all motions.  She is also having more pain in the left shoulder possibly from overdoing it, because she is trying to not use the right shoulder.    OBJECTIVE IMPAIRMENTS: cardiopulmonary status limiting activity, decreased activity tolerance, decreased endurance, decreased ROM, decreased strength, increased edema, increased muscle spasms, impaired flexibility, impaired UE functional use, improper body mechanics, postural dysfunction, and pain.  REHAB POTENTIAL: Good  CLINICAL DECISION MAKING: Evolving/moderate complexity  EVALUATION COMPLEXITY: Low   GOALS: Goals reviewed with patient? Yes  SHORT TERM GOALS: Target date: 01/31/24  Independent with initial HEP Goal status: met 01/31/24  LONG TERM GOALS: Target date: 04/15/24  Decrease pain 50% Goal status: progressing 03/20/24 and 03/26/24, 04/11/24 2.  Dress without difficulty Goal status: progressing 03/20/24 and 03/26/24, met 05/07/24 3.  Do hair without difficulty Goal status:  progressing 03/14/24 and 03/26/24 4.  Increase AROM right shoulder flexion to 130 degrees Goal status:  5.  Increase right shoulder ER to 60 degrees Goal status: progressing 03/14/24, 04/18/24  6.  Return to water  aerobics and or gym activity Goal status: on going 02/26/24  and 03/26/24 progressing 04/30/24  PLAN:  PT FREQUENCY: 1-2x/week  PT DURATION: 12 weeks  PLANNED INTERVENTIONS: Therapeutic exercises, Therapeutic activity, Neuromuscular re-education, Balance training, Gait training, Patient/Family education, Self Care, Joint mobilization, Dry Needling, Electrical stimulation, Cryotherapy, Vasopneumatic device, and Manual therapy  PLAN FOR NEXT SESSION: Try to regain some of her ROM and function, treat pain as needed, address the left shoulder as well  05/09/2024, 8:46 AM  [facility] [address] [place], [place], [address] Phone: [phone]   Fax:  [phone] "

## 2024-05-10 ENCOUNTER — Encounter: Payer: Self-pay | Admitting: Family Medicine

## 2024-05-10 DIAGNOSIS — M1990 Unspecified osteoarthritis, unspecified site: Secondary | ICD-10-CM

## 2024-05-14 ENCOUNTER — Encounter: Payer: Self-pay | Admitting: Physical Therapy

## 2024-05-14 ENCOUNTER — Ambulatory Visit: Admitting: Physical Therapy

## 2024-05-14 DIAGNOSIS — R252 Cramp and spasm: Secondary | ICD-10-CM

## 2024-05-14 DIAGNOSIS — M25611 Stiffness of right shoulder, not elsewhere classified: Secondary | ICD-10-CM

## 2024-05-14 DIAGNOSIS — M6281 Muscle weakness (generalized): Secondary | ICD-10-CM

## 2024-05-14 DIAGNOSIS — M25512 Pain in left shoulder: Secondary | ICD-10-CM

## 2024-05-14 DIAGNOSIS — G8929 Other chronic pain: Secondary | ICD-10-CM

## 2024-05-14 DIAGNOSIS — M542 Cervicalgia: Secondary | ICD-10-CM

## 2024-05-14 NOTE — Therapy (Signed)
 " OUTPATIENT PHYSICAL THERAPY SHOULDER    Patient Name: Veronica Galvan MRN: [id number] DOB:April 22, 1956, 69 y.o., female Today's Date: 05/14/2024  END OF SESSION:  PT End of Session - 05/14/24 0801     Visit Number 29    Date for Recertification  05/17/24    Authorization Type [employer] [place]    PT Start Time 0800    PT Stop Time 0900    PT Time Calculation (min) 60 min    Activity Tolerance Patient tolerated treatment well    Behavior During Therapy WFL for tasks assessed/performed            Past Medical History:  Diagnosis Date   Allergy     Anxiety    Arthritis    hands   Cataract    Diabetes mellitus without complication (HCC)    type 2   Family history of adverse reaction to anesthesia    son has malignant hyperthermia, daughter does not daughter recently had c section without problems   GERD (gastroesophageal reflux disease)    Headache    sinus   Hyperlipidemia    Hypertension    PONV (postoperative nausea and vomiting)    nausea only   Ulcer, stomach peptic yrs ago   Past Surgical History:  Procedure Laterality Date   APPENDECTOMY     both hells bone spur repair     both heels with metal clips   both shoulder rotator cuff repair     CESAREAN SECTION     x 1   COLONOSCOPY WITH PROPOFOL  N/A 09/07/2016   Procedure: COLONOSCOPY WITH PROPOFOL ;  Surgeon: Vicci Gladis POUR, MD;  Location: [facility];  Service: Endoscopy;  Laterality: N/A;   colonscopy  06/2011   polyps   ELBOW FRACTURE SURGERY Left    EYE SURGERY     FRACTURE SURGERY     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/10/2022   Procedure: REVERSE SHOULDER ARTHROPLASTY;  Surgeon: Melita Drivers, MD;  Location: [facility];  Service: Orthopedics;  Laterality: Right;  Please follow in room 6 if able   TUBAL LIGATION     VESICO-VAGINAL FISTULA REPAIR     Patient Active Problem List   Diagnosis Date Noted   Aortic atherosclerosis 02/07/2024   Chronic right shoulder pain 02/07/2024    Gastroesophageal reflux disease 11/16/2021   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 08/18/2021   Dermatofibroma 08/18/2021   History of malignant neoplasm of skin 08/18/2021   Lentigo 08/18/2021   Melanocytic nevi of trunk 08/18/2021   Nevus lipomatosus cutaneous superficialis 08/18/2021   Rosacea 08/18/2021   Actinic keratosis 08/18/2021   Sensorineural hearing loss (SNHL) of left ear with unrestricted hearing of right ear 07/26/2021   Tinnitus of left ear 07/26/2021   Acute recurrent maxillary sinusitis 06/22/2021   Primary hypertension 01/12/2021   Hyperlipidemia associated with type 2 diabetes mellitus (HCC) 01/12/2021   Arthritis 01/12/2021   Type II diabetes mellitus (HCC) 11/25/2019   Ingrown toenail 09/05/2018   Neck pain 01/29/2018   Tick bite 10/24/2017    PCP: [name], [name]  REFERRING PROVIDER:[name], MD  REFERRING DIAG: s/p right reverse TSA with complications  THERAPY DIAG:  Muscle weakness (generalized)  Stiffness of right shoulder, not elsewhere classified  Chronic right shoulder pain  Cervicalgia  Acute pain of left shoulder  Spasm  Rationale for Evaluation and Treatment: Rehabilitation  ONSET DATE: 01/01/24  SUBJECTIVE:  SUBJECTIVE STATEMENT:  Patient continues to have left shoulder pain.    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 We saw her in PT for many visits.  She had good progress but during a very busy time from Thanksgiving to Barling Years she started having pain and some regression of motion.  She has had x-rays, bone scan and a CT.  She saw [name] after the CT.  Feels like it could be  an infection, PT vs a revision.   11/30/23 Patient reports that she saw the surgeon she is frustrated and upset as she feels that she still has no answers, it is felt that there is no loosening and no infection.  She is still hurting with basic ADL's, her AROM decreased beginning in March, as she started having increased pain in mid January after a very busy and active few month, she has an order for the left shoulder and the okay for us  to see her for her right shoulder for another month PAIN:  Are you having pain? Yes: NPRS scale: 7/10 Pain location: right shoulder, HA, left shoulder pain up to 8/10 with motions Pain description: dull ache at rest, sharp with motions Aggravating factors: quick motions, movements pain up to 10/10 Relieving factors: ice, rest, Tylenol  at best 3/10  PRECAUTIONS: none  RED FLAGS: None   WEIGHT BEARING RESTRICTIONS: No  FALLS:  Has patient fallen in last 6 months? no  LIVING ENVIRONMENT: Lives with: lives alone Lives in: House/apartment Stairs: No Has following equipment at home: None  OCCUPATION: retired  PLOF: Independent, very active with grandkids, does 5 classes a week [employer]  PATIENT GOALS:dress without difficulty, do hair, have good ROM and less pain  NEXT MD VISIT: Mid November  OBJECTIVE:   DIAGNOSTIC FINDINGS:  See above  PATIENT SURVEYS:    COGNITION: Overall cognitive status: Within functional limits for tasks assessed     SENSATION: WFL  POSTURE: Fwd head, rounded shoulders, elevated and guarded shoulder  UPPER EXTREMITY ROM:   Active ROM Right AROM  eval Left AROM Eval Right AROM 02/22/24 Right  AROM 03/20/24                  AROM 11/30/23  Shoulder flexion 105 125 112 118                  R 120  Shoulder extension                        Shoulder abduction 85 100 90 93                  Right 100  Shoulder adduction                        Shoulder internal rotation 0 30  10                  Right 28   Shoulder external rotation 46 65  j                    Right 60  Elbow flexion                        Elbow extension                        Wrist flexion                        Wrist extension                        Wrist ulnar deviation                        Wrist radial deviation                        Wrist pronation                        Wrist supination                        (Blank rows = not tested)  UPPER EXTREMITY MMT:  In the available ROM  MMT Right eval Left eval  Shoulder flexion 4- 4-  Shoulder extension    Shoulder abduction    Shoulder adduction    Shoulder internal rotation 4+ 4+  Shoulder external rotation 4-P! 4  Middle trapezius    Lower trapezius    Elbow flexion    Elbow extension    Wrist flexion    Wrist extension    Wrist ulnar deviation    Wrist radial deviation    Wrist pronation    Wrist supination    Grip strength (lbs)    (Blank rows = not tested)  PALPATION:  Very tight and tender in the pectoral, upper trap, the entire right upper arm, slight warmth in the right anterior lateral arm  Positive impingement on the right   TODAY'S TREATMENT:                                                                                                                                          DATE:  05/14/24 UBE level 3 x 6 minutes Wall slides and circles  15# rows 15# lats 5# chest press 5# shrugs with upper trap and levator stretches Ball vs wall approximation with circles PROM bilateral shoulders Vaso left shoulder  05/09/24 UBE level 3 x 5 minutes Wall slides and circles  Red tband row and extension Red tband ER/IR PROM bilateral shoulders STM to the left upper arm, upper trap and neck Vaso left shoulder   05/07/24 UBE level 3 x 5 minutes  Red tband left shoulder ER/IR Red tband left shoulder row and extension Wall slides both arms Shrugs PROM bilateral shoulders STM to the left shoulder upper arm and bilateral upper traps and neck Vaso 34 degrees both shoulders   04/30/24 UBE 6 minutes PROM bilateral shoulders all motions to her pain tolerance STM to the left upper arm, the neck, upper traps and the rhomboids Vaso to the left shoulder medium pressure 34 degrees this was performed after OT treatment  04/23/24 UBE x 4 minutes Passive stretch bilateral shoulders STM to the upper arms very tender deltoid and biceps STM to the neck, upper traps and rhomboids Vaso left shoulder  04/18/24 Passive stretch LE's Passive stretch UE's STM to the LE's STM to the upper arms, the upper traps and the rhomboids and neck Vaso bilateral shoulders  04/16/24 Passive stretch LE's STM to LE's STM to the right upper trap, neck and rhomboid Passive stretch to the right shoulder Vaso right shoulder  04/11/24 Passive LE stretches STM to the LE's Passive ROM bilateral UE's STM to the right upper trap, neck, rhomboid and upper arm Right vaso 34 degrees  04/10/24 Passive stretch bilateral UE's Passive stretch LE's STM to the upper traps, neck and the bilateral UE's very tender today  04/04/24 Passive stretch of the LE's  Passive stretch of the UE's STM to the LE's STM to the  upper traps and rhomboids Vaso to the left shoulder  04/03/24 Nustep level 4 x 5 minutes Passive stretch shoulders bilaterally STM to the neck, upper traps and rhomboids Vaso 34 degrees right shoulder  03/26/24 UBE L 2 each way Passive stretch bilateral UE's STM to the upper traps, neck and rhomboids, some MFR to knots in the upper traps Vaso 34 degrees   PATIENT EDUCATION: Education details: poc Person educated: Patient Education method: Programmer, Multimedia, Facilities Manager, Actor cues, Verbal cues, and Handouts Education comprehension: verbalized understanding  HOME EXERCISE PROGRAM:  ASSESSMENT:  CLINICAL IMPRESSION:  Patient doing okay, will have injection in the left shoulder this week , her right TSA is scheduled for 05/29/24. I tried to work on functional strength today, will need to remeasure and see how the left arm is after the injection   Patient reports that she is doing okay, she reports that she is trying to back off of some activities and break things down over time to ease the stress and strain thinks this is helping as she is making soup for the community and is doing this over a few days instead of all at one time..She has a lot of knots in the upper traps, neck and rhomboids, her ROM is improving.  She is having left shoulder pain as well due to it compensating so much, she has worries about this and needing the left shoulder to be strong so when she gets the right TSA she can have a good shoulder  Evaluation:  Patient is very frustrated, she had the right reverse TSA last July.  She did very well until around Christmas, then she really started having increased pain and lost some ROM.  She has had blood work done, US , bone scan and CT performed.  There has been some talk of a possible infection, MD feels like she may benefit from a revision.  She would like to try PT again and the MD agreed.  She has lost ROM since I last saw her, she also is having some significant left  shoulder pain from over using it.   July 2025:  Patient saw surgeon, the bone scan said did not feel that there was infection or loosening however the surgeon is not so sure, he wants to do an US  to be sure, that is scheduled for the next few weeks, since she has not been to PT in a month I re-measured her ROM, she has lost at least 15 degrees of motion and more for all motions.  She is also having more pain in the left shoulder possibly from overdoing it, because she is trying to not use the right shoulder.    OBJECTIVE IMPAIRMENTS: cardiopulmonary status limiting activity, decreased activity tolerance, decreased endurance, decreased ROM, decreased strength, increased edema, increased muscle spasms, impaired flexibility, impaired UE functional use, improper body mechanics, postural dysfunction, and pain.  REHAB POTENTIAL: Good  CLINICAL DECISION MAKING: Evolving/moderate complexity  EVALUATION COMPLEXITY: Low   GOALS: Goals reviewed with patient? Yes  SHORT TERM GOALS: Target date: 01/31/24  Independent with initial HEP Goal status: met 01/31/24  LONG TERM GOALS: Target date: 04/15/24  Decrease pain 50% Goal status: progressing 03/20/24 and 03/26/24, 04/11/24 2.  Dress without difficulty Goal status: progressing 03/20/24 and 03/26/24, met 05/07/24 3.  Do hair without difficulty Goal status: progressing 03/14/24 and 03/26/24 4.  Increase AROM right shoulder flexion to 130 degrees Goal status:  5.  Increase right shoulder ER to 60 degrees Goal status: progressing 03/14/24, 04/18/24  6.  Return to water  aerobics and or gym activity Goal status: on going 02/26/24  and 03/26/24 progressing 04/30/24  PLAN:  PT FREQUENCY: 1-2x/week  PT DURATION: 12 weeks  PLANNED INTERVENTIONS: Therapeutic exercises, Therapeutic activity, Neuromuscular re-education, Balance training, Gait training, Patient/Family education, Self Care, Joint mobilization, Dry Needling, Electrical stimulation,  Cryotherapy, Vasopneumatic device, and Manual therapy  PLAN FOR NEXT SESSION: Try to regain some of her ROM and function, treat pain as needed, address the left shoulder as well  05/14/2024, 8:01 AM [facility] [address] [facility] [place] [place], [place], [address] Phone: [phone]   Fax:  [phone] "

## 2024-05-15 ENCOUNTER — Ambulatory Visit: Payer: Self-pay

## 2024-05-15 ENCOUNTER — Ambulatory Visit: Admitting: Occupational Therapy

## 2024-05-15 ENCOUNTER — Ambulatory Visit: Admitting: Family Medicine

## 2024-05-15 VITALS — BP 134/74 | HR 80 | Ht 61.0 in | Wt 193.0 lb

## 2024-05-15 DIAGNOSIS — M6281 Muscle weakness (generalized): Secondary | ICD-10-CM | POA: Diagnosis not present

## 2024-05-15 DIAGNOSIS — M79642 Pain in left hand: Secondary | ICD-10-CM

## 2024-05-15 DIAGNOSIS — G8929 Other chronic pain: Secondary | ICD-10-CM

## 2024-05-15 DIAGNOSIS — R278 Other lack of coordination: Secondary | ICD-10-CM

## 2024-05-15 DIAGNOSIS — M25641 Stiffness of right hand, not elsewhere classified: Secondary | ICD-10-CM

## 2024-05-15 DIAGNOSIS — M79641 Pain in right hand: Secondary | ICD-10-CM

## 2024-05-15 DIAGNOSIS — M25512 Pain in left shoulder: Secondary | ICD-10-CM

## 2024-05-15 DIAGNOSIS — M25642 Stiffness of left hand, not elsewhere classified: Secondary | ICD-10-CM

## 2024-05-15 NOTE — Therapy (Addendum)
 " OUTPATIENT OCCUPATIONAL THERAPY ORTHO  treatment  Patient Name: Veronica Galvan MRN: [id number] DOB:1955/06/21, 69 y.o., female Today's Date: 05/15/2024  PCP: Almarie Birmingham, NP REFERRING PROVIDER: Almarie Birmingham, NP  END OF SESSION:  OT End of Session - 05/15/24 1323     Visit Number 12    Number of Visits 12    Date for Recertification  05/15/24    Authorization Type BCBS MCR    Authorization Time Period 12 weeks    OT Start Time 1320    OT Stop Time 1402    OT Time Calculation (min) 42 min    Activity Tolerance Patient tolerated treatment well    Behavior During Therapy WFL for tasks assessed/performed                 Past Medical History:  Diagnosis Date   Allergy     Anxiety    Arthritis    hands   Cataract    Diabetes mellitus without complication (HCC)    type 2   Family history of adverse reaction to anesthesia    son has malignant hyperthermia, daughter does not daughter recently had c section without problems   GERD (gastroesophageal reflux disease)    Headache    sinus   Hyperlipidemia    Hypertension    PONV (postoperative nausea and vomiting)    nausea only   Ulcer, stomach peptic yrs ago   Past Surgical History:  Procedure Laterality Date   APPENDECTOMY     both hells bone spur repair     both heels with metal clips   both shoulder rotator cuff repair     CESAREAN SECTION     x 1   COLONOSCOPY WITH PROPOFOL  N/A 09/07/2016   Procedure: COLONOSCOPY WITH PROPOFOL ;  Surgeon: Vicci Gladis POUR, MD;  Location: [facility];  Service: Endoscopy;  Laterality: N/A;   colonscopy  06/2011   polyps   ELBOW FRACTURE SURGERY Left    EYE SURGERY     FRACTURE SURGERY     REVERSE SHOULDER ARTHROPLASTY Right 11/10/2022   Procedure: REVERSE SHOULDER ARTHROPLASTY;  Surgeon: Melita Drivers, MD;  Location: [facility];  Service: Orthopedics;  Laterality: Right;  Please follow in room 6 if able   TUBAL LIGATION     VESICO-VAGINAL FISTULA REPAIR      Patient Active Problem List   Diagnosis Date Noted   Aortic atherosclerosis 02/07/2024   Chronic right shoulder pain 02/07/2024   Gastroesophageal reflux disease 11/16/2021   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 08/18/2021   Dermatofibroma 08/18/2021   History of malignant neoplasm of skin 08/18/2021   Lentigo 08/18/2021   Melanocytic nevi of trunk 08/18/2021   Nevus lipomatosus cutaneous superficialis 08/18/2021   Rosacea 08/18/2021   Actinic keratosis 08/18/2021   Sensorineural hearing loss (SNHL) of left ear with unrestricted hearing of right ear 07/26/2021   Tinnitus of left ear 07/26/2021   Acute recurrent maxillary sinusitis 06/22/2021   Primary hypertension 01/12/2021   Hyperlipidemia associated with type 2 diabetes mellitus (HCC) 01/12/2021   Arthritis 01/12/2021   Type II diabetes mellitus (HCC) 11/25/2019   Ingrown toenail 09/05/2018   Neck pain 01/29/2018   Tick bite 10/24/2017    ONSET DATE: 02/07/24  REFERRING DIAG: M19.90 (ICD-10-CM) - Arthritis      THERAPY DIAG:  Muscle weakness (generalized)  Pain in right hand  Pain in left hand  Other lack of coordination  Stiffness of left hand, not elsewhere classified  Stiffness  of right hand, not elsewhere classified  Rationale for Evaluation and Treatment: Rehabilitation  SUBJECTIVE:   SUBJECTIVE STATEMENT: Pt reports her hands are painful today Pt accompanied by: self  PERTINENT HISTORY: 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 Pt was previously seen for OT and d/c 12/06/23  PRECAUTIONS: hx of R reverse toal shoulder, none for hands    WEIGHT BEARING RESTRICTIONS: No  PAIN:  Are you having pain? Yes: NPRS scale: 6/10 Pain location: bilateral hands Pain description: aching Aggravating factors: overuse, waking up in pain Relieving factors: paraffin Pt has R shoulder pain 6-8/10,  PT is addressing, OT will not address., FALLS: Has patient fallen in last 6 months? No  LIVING ENVIRONMENT: Lives with: lives alone Lives in: House/apartment   PLOF: Independent  PATIENT GOALS: decrease pain in hands, improve function/ mobility  NEXT MD VISIT: unknown  OBJECTIVE:  Note: Objective measures were completed at Evaluation unless otherwise noted.  HAND DOMINANCE: Right  ADLs:Pt reports increased overall difficulty with ADLs due to pain and stiffness. Pt is mod I with basic ADLs. She has difficulty with buttons, styling hair, opening jars.    FUNCTIONAL OUTCOME MEASURES: Upper Extremity Functional Scale (UEFS): 33/80  UPPER EXTREMITY ROM:   RUE composite finger flexion 70% (3cm from palm at middle finger)     LUE composite finer flexion 60% (3.5cm from palm at middle finger) bilateral UE's grossly 90-95% composite finger extension     HAND FUNCTION: Grip strength: Right: 18 lbs; Left: 15 lbs, Lateral pinch: Right: 10 lbs, Left: 12 lbs, and Tip pinch: Right 4 lbs, Left: 2 lbs  COORDINATION:NT   SENSATION: WFL  EDEMA: Pt with bony defomities at PIP joints of all digits which pt reports are bone spurs, Pt also has bony deformity at DIP for right thumb, index and 5th digit and LUE small and index fingers   COGNITION: Overall cognitive status: Within functional limits for tasks assessed   OBSERVATIONS: Pleasant female well known to therapist from previous occupational therapy at this site   TREATMENT DATE: 05/15/24-Paraffin to right hand x 10 min no adverse reactions while therapist performed soft tissue/ joint mobs to left  hands, fingers  and wrists, forearms as well as P/ROM individual finger flexion, composite finger flexion to digits.  Paraffin to left hand x 10 mins no adverse reactions while pt. performed fine motor coordination task with RUE to place and remove grooved pegs from pegboard, min-mod difficulty, and min v.c  RUE soft tissue/ joint mobs to  hands, fingers  and wrists, forearms as well as P/ROM individual finger flexion, composite finger flexion to digits.  Placing and removing grooved pegs from pegboard with LUE, min difficulty and v.c to avoid compensation. Yellow putty for composite grip with left and RUE min v.c  10 reps each  Hot pack was applied x 10 mins to dorsal neck and upper traps due to tightness while pt performed fine motor and functional tasks. When hotpack was removed pt demonstrated pink coloration, however she was in no distress.Pt reported neck/ traps felt better and pt was instucted to monitor redness.   05/08/24-Paraffin to right hand x no adverse reactions while pt performed strengthening and fine motor activities with LUE. With OLZ:Iphp- flex light resistance, graded clothespins 1-8# for sustained pinch for individual finger flexion, placing and removing grooved pegs from pegboard using in hand manipulation, min-mod difficulty and v.c for positioning.  Paraffin  to left hand x 15 mins no adverse reactions while pt performed strengthening and fine motor activities with LUE. With MLZ:Iphp- flex light resistance, graded clothespins 1-8# for sustained pinch for individual finger flexion, placing and removing grooved pegs from pegboard using in hand manipulation, min-mod difficulty and v.c for positioning.  Soft tissue/ joint mobs to hands, fingers  and wrists, forearms as well as P/ROM individual finger flexion, composite finger flexion to digits. Positioning/ joint protection for carrying water  cup.  04/30/24-Pt with increased hand pain after overuse this weekend washing dishes.  Paraffin to right hand for pain and stiffness 8 mins, no adverse reactions while therapist performed US  to left hand and digits 3.3 mhz, 0.8 w/cm 2, 20% x 8 mins no adverse reactions. Paraffin to left hand for pain/ stiffness x 8 mins, no adverse reactions while therapist performed US  to right hand and digits 3.3 mhz, 0.8 w/cm 2, 20% x 8  mins no adverse reactions. Soft tissue/ joint mobs to hands, fingers  and wrists, forearms as well as P/ROM individual finger flexion, composite finger flexion to digits. Pt reports she purchased microwave heated mits for hands and they are working well  04/22/24-Pt with increased hand pain after overuse this weekend.  Paraffin to right hand for pain and stiffness 8 mins, no adverse reactions while therapist performed US  to left hand and digits 3.3 mhz, 0.8 w/cm 2, 20% x 8 mins no adverse reactions. Paraffin to left hand for pain/ stiffness x 8 mins, no adverse reactions while therapist performed US  to right hand and digits 3.3 mhz, 0.8 w/cm 2, 20% x 8 mins no adverse reactions. Soft tissue/ joint mobs to hands, fingers  and wrists, forarms as well as P/ROM composite finger flexion to digits. Discussed use of microwave hot mitts for managing hand pain while traveling or after shoulder surgery. Pt ordered them online.   04/16/24-Paraffin to right hand for pain  and stiffness 8 mins, no adverse reactions while therapist performed US  to left hand and digits 3.3 mhz, 0.8 w/cm 2, 20% x 8 mins no adverse reactions. Paraffin to left hand for pain/ stiffness x 8 mins, no adverse reactions while therapist performed US  to right hand and digits 3.3 mhz, 0.8 w/cm 2, 20% x 8 mins no adverse reactions. Soft tissue/ joint mobs to hands, fingers  and wrists, forarms as well as P/ROM composite and individual finger flexion to digits. Pt performed grip strengthening with resistive tree for bilateral hands. Pt reports hands were looser at end of session    12/10/25Paraffin to right hand for pain  and stiffness 8 mins, no adverse reactions while therapist performed US  to left hand and digits 3.3 mhz, 0.8 w/cm 2, 20% x 8 mins no adverse reactions. Paraffin to left hand for pain/ stiffness x 8 mins, no adverse reactions while therapist performed US  to right hand and digits 3.3 mhz, 0.8 w/cm 2, 20% x 8 mins no adverse  reactions. Soft tissue/ joint mobs to hands, fingers  and wrists as well as P/ROM composite and individual finger flexion to digits. Therapist checked pinch strength and ROM at end of session. Pt met pinch strength goals.  12/3/25paraffin to right hand for pain management x 8 mins, no adverse reactions while therapist performed US  to left hand and digits 3.3 mhz, 0.8 w/cm 2, 20% x 8 mins no adverse reactions. Paraffin to left hand for pain management x 8 mins, no adverse reactions while therapist performed US  to right hand and digits 3.3 mhz,  0.8 w/cm 2, 20% x 8 mins no adverse reactions. Soft tissue/ joint mobs to hands and wrists as well as P/ROM comosite finger flexion to digits.  11/18/25Paraffin to right hand for pain management x 8 mins, no adverse reactions while therapist performed US  to left hand and digits 3.3 mhz, 0.8 w/cm 2, 20% x 8 mins no adverse reactions. Paraffin to left hand for pain management x 8 mins, no adverse reactions while therapist performed US  to right hand and digits 3.3 mhz, 0.8 w/cm 2, 20% x 8 mins no adverse reactions. Pt reports seeking assistance for cooking soup for church and receiving a padddle at therapist recommendation for joint protection when stirring. Therpist recommends pt coniders delgating the soup task to others in the future or that she seeks additional assist. araffin to right hand for pain management x 8 mins, no adverse reactions while therapist performed US  to left hand and digits 3.3 mhz, 0.8 w/cm 2, 20% x 8 mins no adverse reactions. Paraffin to left hand for pain management x 8 mins, no adverse reactions while therapist performed US  to right hand and digits 3.3 mhz, 0.8 w/cm 2, 20% x 8 mins no adverse reactions. with lotion. P/ROM to digits in flexion individually and compositely. A/ROM finger thumb opposition as able to bilateral hands  11/12/24Paraffin to right hand for pain management x 8 mins, no adverse reactions while therapist performed US  to  left hand and digits 3.3 mhz, 0.8 w/cm 2, 20% x 8 mins no adverse reactions. Paraffin to left hand for pain management x 8 mins, no adverse reactions while therapist performed US  to right hand and digits 3.3 mhz, 0.8 w/cm 2, 20% x 8 mins no adverse reactions. Pt reports making changes to her baking schedule and ordering a padddle at therapist recommendation for joint protection. Soft tissue and joint mobs to bilateral hands, wrist and forearms. P/ROM to digits in flexion individually and compositely. Pt demonstrates improved flexibility end of session.       03/05/24 Paraffin to right hand for pain management x 8 mins, no adverse reactions while therapist performed US  to left hand and digits 3.3 mhz, 0.8 w/cm 2, 20% x 8 mins no adverse reactions. Paraffin to left hand for pain management x 8 mins, no adverse reactions while therapist performed US  to right hand and digits 3.3 mhz, 0.8 w/cm 2, 20% x 8 mins no adverse reactions.P/ROM to digits in flexion individually and compositely.discussed options for self care with pt's upcoming cooking activities: ie use of paddle, and suggestions for when pt has shoulder sugery.   Soft tissue and joint mobs to bilateral hands, wrist and forearms. Grip strengthening with stress tree for resisted grip  02/26/24 Paraffin to right hand for pain management x 8 mins, no adverse reactions while therapist perfromed US  to left hand and digits 3.3 mhz, 0.8 w/cm 2, 20% x 8 mins no adverse reactions. Paraffin to left hand for pain management x 8 mins, no adverse reactions while therapist performed US  to right hand and digits 3.3 mhz, 0.8 w/cm 2, 20% x 8 mins no adverse reactions.P/ROM to digits in flexion  individually and compositely.  Soft tissue and joint mobs to bilateral hands, wrist and forearms.  Discussed improtance of balancing activity and rest in order to management pain and prevent injury.  02/21/24- eval Paraffin to bilateral hands and wrists for pain management  x 8 mins, no adverse reactions. P/ROM to digits in flexion  individually and compositely soft tissue mobs to bilateral  hands. Pt reports decreased pain end of session.      PATIENT EDUCATION: Education details: see above Person educated: Patient Education method: Explanation Education comprehension: verbalized understanding  HOME EXERCISE PROGRAM: n/a  GOALS: Goals reviewed with patient? Yes  SHORT TERM GOALS: Target date: 03/23/24  I with inital HEP.  Goal status:  ongoing 05/08/24  2.    Pt will improve RUE tip pinch by 2 lbs(from baseline) for increased ease with daily activities. Revised goal:Pt will improve RUE tip pinch to 7 lbs for increased ease with daily activities.                               baseline: RUE tip 4lbs, LUE tip 2 lbs                          Goal status: met 6 lbs 04/10/24, continue with revised goal  3.  Pt will improve LUE tip pinch by 2 lbs(from baseline) for increased ease with daily activities. Revised goal:Pt will improve LUE tip pinch to 5 lbs for increased ease with daily activities. baseline: RUE tip 4lbs, LUE tip 2 lbs Goal status:  met 4lbs 04/10/24, continue with revised goal  4.  I with adapted strategies/ adapted equipment to minimize pain and to increase pt I with ADLs/IADLs   Goal status: met, 05/15/24  5.  Pt will report bilateral hand pain no greater than 4/10 for ADLs/IADLS Baseline: RUE 7/10, LUE 6/10 Goal status: 6/10 on arrival 05/15/24- ongoing      LONG TERM GOALS: Target date: 05/15/24      Pt will improve RUE grip strength to 22 # or greater for increased functional use Baseline: RUE 18# Goal status:  ongoing 20# 04/30/24  2.  Pt will improve LUE grip strength to 25 # or greater for increased functional use Baseline: LUE 21# Goal status:  ongoing 17# pts shoulder is painful 04/30/24   3.Pt will demonstrate improved composite finger flexion for ADLs as evidenced by pt. bringing middle fingertip for RUE within 2.75  cm of palm.             Baseline: 3 cm from palm to middle fingertip             Goal status: met today,  pt demonstrates 2 cm from palm today, met 04/10/24- will monitor for consistency   4. .Pt will demonstrate improved composite finger flexion for ADLs as evidenced by pt. bringing middle fingertip for LUE within 3.25 of palm.             Baseline: 3.5 cm from palm to middle fingertip             Goal status: met today, pt demonstrates 2 cm from palm today, met 04/10/24- will monitor for consistency    5. check 9 hole peg test and set goals prn- deferred 04/10/24 ASSESSMENT:  CLINICAL IMPRESSION: Pt is progressing towards goals. She reports improved overall functional use of bilateral UE's since beginning occupational therapy. She demonstrates improved understanding of activity modification, energy conservation and use of AE.  SABRAPERFORMANCE DEFICITS: in functional skills including ADLs, IADLs, coordination, dexterity, proprioception, sensation, edema, ROM, strength, pain, flexibility, Fine motor control, Gross motor control, endurance, decreased knowledge of precautions, decreased knowledge of use of DME, and UE functional use,and psychosocial skills including coping strategies, environmental adaptation, habits, interpersonal interactions, and routines and behaviors.  IMPAIRMENTS: are limiting patient from ADLs, IADLs, rest and sleep, play, leisure, and social participation.   COMORBIDITIES: may have co-morbidities  that affects occupational performance. Patient will benefit from skilled OT to address above impairments and improve overall function.  MODIFICATION OR ASSISTANCE TO COMPLETE EVALUATION: No modification of tasks or assist necessary to complete an evaluation.  OT OCCUPATIONAL PROFILE AND HISTORY: Detailed assessment: Review of records and additional review of physical, cognitive, psychosocial history related to current functional performance.  CLINICAL DECISION MAKING: LOW -  limited treatment options, no task modification necessary  REHAB POTENTIAL: Good  EVALUATION COMPLEXITY: Low      PLAN:  OT FREQUENCY: 1-2x/week  OT DURATION: 12 weeks  PLANNED INTERVENTIONS: 97168 OT Re-evaluation, 97535 self care/ADL training, 02889 therapeutic exercise, 97530 therapeutic activity, 97112 neuromuscular re-education, 97140 manual therapy, 97035 ultrasound, 97018 paraffin, 02989 moist heat, 97010 cryotherapy, 97014 electrical stimulation unattended, 97760 Orthotic Initial, 97763 Orthotic/Prosthetic subsequent, scar mobilization, passive range of motion, functional mobility training, energy conservation, coping strategies training, patient/family education, and DME and/or AE instructions  RECOMMENDED OTHER SERVICES: n/a  CONSULTED AND AGREED WITH PLAN OF CARE: Patient  PLAN FOR NEXT SESSION:,  check goals and anticipate d/c next visit   Navika Hoopes, OT 05/15/2024, 1:45 PM   "

## 2024-05-15 NOTE — Progress Notes (Signed)
 "        I, Ileana Collet, PhD, LAT, ATC acting as a scribe for Artist Lloyd, MD.  Veronica Galvan is a 69 y.o. female who presents to [facility] at [facility] today for exacerbation of her L shoulder pain. Pt was last seen by Dr. Lloyd on 02/07/24 and was given a L GH steroid injection. Pt is scheduled for a R reverse total should replacement on the 28th w/ [name]  Today, pt reports her R shoulder is terrible. She is wanting to have her L shoulder working and feeling a good as possible, to help in recovery of her R should surgery.  Dx imaging: 09/06/23 L shoulder XR   Pertinent review of systems: No fevers or chills  Relevant historical information: Diabetes   Exam:  BP 134/74   Pulse 80   Ht 5' 1 (1.549 m)   Wt 193 lb (87.5 kg)   SpO2 96%   BMI 36.47 kg/m  General: Well Developed, well nourished, and in no acute distress.   MSK: Left shoulder mature scar superior shoulder.  Decreased range of motion pain with abduction intact strength.    Lab and Radiology Results  Procedure: Real-time Ultrasound Guided Injection of left shoulder subacromial bursa Device: Philips Affiniti 50G/GE Logiq Images permanently stored and available for review in PACS Verbal informed consent obtained.  Discussed risks and benefits of procedure. Warned about infection, bleeding, hyperglycemia damage to structures among others. Patient expresses understanding and agreement Time-out conducted.   Noted no overlying erythema, induration, or other signs of local infection.   Skin prepped in a sterile fashion.   Local anesthesia: Topical Ethyl chloride.   With sterile technique and under real time ultrasound guidance: 40 mg of Kenalog  and 2 mL of Marcaine  injected into subacromial bursa. Fluid seen entering the bursa.   Completed without difficulty   Pain moderately immediately resolved suggesting accurate placement of the medication.   Advised to call if fevers/chills, erythema,  induration, drainage, or persistent bleeding.   Images permanently stored and available for review in the ultrasound unit.  Impression: Technically successful ultrasound guided injection.    Procedure: Real-time Ultrasound Guided Injection of left shoulder glenohumeral joint posterior approach Device: Philips Affiniti 50G/GE Logiq Images permanently stored and available for review in PACS Verbal informed consent obtained.  Discussed risks and benefits of procedure. Warned about infection, bleeding, hyperglycemia damage to structures among others. Patient expresses understanding and agreement Time-out conducted.   Noted no overlying erythema, induration, or other signs of local infection.   Skin prepped in a sterile fashion.   Local anesthesia: Topical Ethyl chloride.   With sterile technique and under real time ultrasound guidance: 40 mg of Kenalog  and 2 mL of Marcaine  injected into shoulder glenohumeral joint. Fluid seen entering the joint capsule.   Completed without difficulty   Pain moderately immediately resolved suggesting accurate placement of the medication.   Advised to call if fevers/chills, erythema, induration, drainage, or persistent bleeding.   Images permanently stored and available for review in the ultrasound unit.  Impression: Technically successful ultrasound guided injection.         Assessment and Plan: 69 y.o. female with chronic left shoulder pain.  Source of pain has been somewhat challenging to pin down.  She has had better results with subacromial injection and glenohumeral injections in the past.  Would proceed with both injections today to try to give her the best shot of better control of left shoulder pain  so that when she has her right shoulder replacement in the near future she is able to do a little more.  Consider updating advanced imaging left shoulder following recovery from her right shoulder.  [name] is orthopedic surgeon for right  shoulder. PDMP not reviewed this encounter. Orders Placed This Encounter  Procedures   US  LIMITED JOINT SPACE STRUCTURES UP LEFT(NO LINKED CHARGES)    Reason for Exam (SYMPTOM  OR DIAGNOSIS REQUIRED):   left shoulder pain    Preferred imaging location?:   [facility]   No orders of the defined types were placed in this encounter.    Discussed warning signs or symptoms. Please see discharge instructions. Patient expresses understanding.   The above documentation has been reviewed and is accurate and complete Artist Lloyd, M.D.   "

## 2024-05-15 NOTE — Patient Instructions (Signed)
 Thank you for coming in today.   You received an injection today. Seek immediate medical attention if the joint becomes red, extremely painful, or is oozing fluid.   Speedy recovery!!

## 2024-05-16 ENCOUNTER — Ambulatory Visit: Admitting: Physical Therapy

## 2024-05-16 ENCOUNTER — Encounter: Payer: Self-pay | Admitting: Physical Therapy

## 2024-05-16 DIAGNOSIS — R252 Cramp and spasm: Secondary | ICD-10-CM

## 2024-05-16 DIAGNOSIS — M25611 Stiffness of right shoulder, not elsewhere classified: Secondary | ICD-10-CM

## 2024-05-16 DIAGNOSIS — M542 Cervicalgia: Secondary | ICD-10-CM

## 2024-05-16 DIAGNOSIS — G8929 Other chronic pain: Secondary | ICD-10-CM

## 2024-05-16 DIAGNOSIS — M6281 Muscle weakness (generalized): Secondary | ICD-10-CM

## 2024-05-16 DIAGNOSIS — M25512 Pain in left shoulder: Secondary | ICD-10-CM

## 2024-05-16 NOTE — Patient Instructions (Signed)
 SURGICAL WAITING ROOM VISITATION Patients having surgery or a procedure may have no more than 2 support people in the waiting area - these visitors may rotate in the visitor waiting room.   Due to an increase in RSV and influenza rates and associated hospitalizations, children ages 6 and under may not visit patients in [facility] hospitals. If the patient needs to stay at the hospital during part of their recovery, the visitor guidelines for inpatient rooms apply.  PRE-OP VISITATION  Pre-op nurse will coordinate an appropriate time for 1 support person to accompany the patient in pre-op.  This support person may not rotate.  This visitor will be contacted when the time is appropriate for the visitor to come back in the pre-op area.  Please refer to the [facility] website for the visitor guidelines for Inpatients (after your surgery is over and you are in a regular room).  You are not required to quarantine at this time prior to your surgery. However, you must do this: Hand Hygiene often Do NOT share personal items Notify your provider if you are in close contact with someone who has COVID or you develop fever 100.4 or greater, new onset of sneezing, cough, sore throat, shortness of breath or body aches.  If you test positive for Covid or have been in contact with anyone that has tested positive in the last 10 days please notify you surgeon.    Your procedure is scheduled on: 05/29/24   Report to [facility] Main Entrance: [place] entrance where the [employer] is available.   Report to admitting at: 5:45 AM  Call this number if you have any questions or problems the morning of surgery [phone]  FOLLOW ANY ADDITIONAL PRE OP INSTRUCTIONS YOU RECEIVED FROM YOUR SURGEON'S OFFICE!!!  Do not eat food after Midnight the night prior to your surgery/procedure.  After Midnight you may have the following liquids until: 5:15 AM DAY OF SURGERY  Clear Liquid Diet Water  Black  Coffee (sugar ok, NO MILK/CREAM OR CREAMERS)  Tea (sugar ok, NO MILK/CREAM OR CREAMERS) regular and decaf                             Plain Jell-O  with no fruit (NO RED)                                           Fruit ices (not with fruit pulp, NO RED)                                     Popsicles (NO RED)                                                                  Juice: NO CITRUS JUICES: only apple, WHITE grape, WHITE cranberry Sports drinks like Gatorade or Powerade (NO RED)   The day of surgery:  Drink ONE (1) Pre-Surgery Clear G2 at : 5:15 AM the morning of surgery. Drink in one sitting. Do not sip.  This drink was given  to you during your hospital pre-op appointment visit. Nothing else to drink after completing the Pre-Surgery Clear Ensure or G2 : No candy, chewing gum or throat lozenges.    Oral Hygiene is also important to reduce your risk of infection.        Remember - BRUSH YOUR TEETH THE MORNING OF SURGERY WITH YOUR REGULAR TOOTHPASTE  Do NOT smoke after Midnight the night before surgery.  STOP TAKING all Vitamins, Herbs and supplements 1 week before your surgery.   Take ONLY these medicines the morning of surgery with A SIP OF WATER : omeprazole .Tylenol ,alprazolam  as needed.  How to Manage Your Diabetes Before and After Surgery  Why is it important to control my blood sugar before and after surgery? Improving blood sugar levels before and after surgery helps healing and can limit problems. A way of improving blood sugar control is eating a healthy diet by:  Eating less sugar and carbohydrates  Increasing activity/exercise  Talking with your doctor about reaching your blood sugar goals High blood sugars (greater than 180 mg/dL) can raise your risk of infections and slow your recovery, so you will need to focus on controlling your diabetes during the weeks before surgery. Make sure that the doctor who takes care of your diabetes knows about your planned surgery  including the date and location.  How do I manage my blood sugar before surgery? Check your blood sugar at least 4 times a day, starting 2 days before surgery, to make sure that the level is not too high or low. Check your blood sugar the morning of your surgery when you wake up and every 2 hours until you get to the Short Stay unit. If your blood sugar is less than 70 mg/dL, you will need to treat for low blood sugar: Do not take insulin . Treat a low blood sugar (less than 70 mg/dL) with  cup of clear juice (cranberry or apple), 4 glucose tablets, OR glucose gel. Recheck blood sugar in 15 minutes after treatment (to make sure it is greater than 70 mg/dL). If your blood sugar is not greater than 70 mg/dL on recheck, call [phone] for further instructions. Report your blood sugar to the short stay nurse when you get to Short Stay.  If you are admitted to the hospital after surgery: Your blood sugar will be checked by the staff and you will probably be given insulin  after surgery (instead of oral diabetes medicines) to make sure you have good blood sugar levels. The goal for blood sugar control after surgery is 80-180 mg/dL.   WHAT DO I DO ABOUT MY DIABETES MEDICATION?   HOLD Synjardy  after: 05/25/24  DO NOT TAKE THE FOLLOWING 7 DAYS PRIOR TO SURGERY: Ozempic , Wegovy , Rybelsus  (Semaglutide ), Byetta (exenatide), Bydureon (exenatide ER), Victoza, Saxenda (liraglutide), or Trulicity (dulaglutide) Mounjaro (Tirzepatide) Adlyxin (Lixisenatide), Polyethylene Glycol Loxenatide.HOLD Semaglutide  after: 05/21/24                    You may not have any metal on your body including hair pins, jewelry, and body piercing  Do not wear make-up, lotions, powders, perfumes / cologne, or deodorant  Do not wear nail polish including gel and S&S, artificial / acrylic nails, or any other type of covering on natural nails including finger and toenails. If you have artificial nails, gel coating, etc., that needs  to be removed by a nail salon, Please have this removed prior to surgery. Not doing so may mean that your surgery could be cancelled  or delayed if the Surgeon or anesthesia staff feels like they are unable to monitor you safely.   Do not shave 48 hours prior to surgery to avoid nicks in your skin which may contribute to postoperative infections.   Contacts, Hearing Aids, dentures or bridgework may not be worn into surgery. DENTURES WILL BE REMOVED PRIOR TO SURGERY PLEASE DO NOT APPLY Poly grip OR ADHESIVES!!!  You may bring a small overnight bag with you on the day of surgery, only pack items that are not valuable. [place] IS NOT RESPONSIBLE   FOR VALUABLES THAT ARE LOST OR STOLEN.   Patients discharged on the day of surgery will not be allowed to drive home.  Someone NEEDS to stay with you for the first 24 hours after anesthesia.  Do not bring your home medications to the hospital. The Pharmacy will dispense medications listed on your medication list to you during your admission in the Hospital.  Special Instructions: Bring a copy of your healthcare power of attorney and living will documents the day of surgery, if you wish to have them scanned into your [facility] Records- EPIC  Please read over the following fact sheets you were given: IF YOU HAVE QUESTIONS ABOUT YOUR PRE-OP INSTRUCTIONS, PLEASE CALL [phone]  PATIENT SIGNATURE_________________________________  NURSE SIGNATURE__________________________________  __________[facility]- Preparing for Total Shoulder Arthroplasty    Before surgery, you can play an important role. Because skin is not sterile, your skin needs to be as free of germs as possible. You can reduce the number of germs on your skin by using the following products. Benzoyl Peroxide Gel Reduces the number of germs present on the skin Applied twice a day to shoulder area starting two days before surgery     ==================================================================  Please follow these instructions carefully:  BENZOYL PEROXIDE 5% GEL  Please do not use if you have an allergy  to benzoyl peroxide.   If your skin becomes reddened/irritated stop using the benzoyl peroxide.  Starting two days before surgery, apply as follows: Apply benzoyl peroxide in the morning and at night. Apply after taking a shower. If you are not taking a shower clean entire shoulder front, back, and side along with the armpit with a clean wet washcloth.  Place a quarter-sized dollop on your shoulder and rub in thoroughly, making sure to cover the front, back, and side of your shoulder, along with the armpit.   2 days before ____ AM   ____ PM              1 day before ____ AM   ____ PM                         Do this twice a day for two days.  (Last application is the night before surgery, AFTER using the CHG soap as described below).  Do NOT apply benzoyl peroxide gel on the day of surgery.______________________________________________________________  Pre-operative 4 CHG Bath Instructions  DYNA-Hex 4 Chlorhexidine  Gluconate 4% Solution Antiseptic 4 fl. oz   You can play a key role in reducing the risk of infection after surgery. Your skin needs to be as free of germs as possible. You can reduce the number of germs on your skin by washing with CHG (chlorhexidine  gluconate) soap before surgery. CHG is an antiseptic soap that kills germs and continues to kill germs even after washing.   DO NOT use if you have an allergy  to chlorhexidine /CHG or antibacterial soaps. If  your skin becomes reddened or irritated, stop using the CHG and notify one of our RNs at   Please shower with the CHG soap starting 4 days before surgery using the following schedule:     Please keep in mind the following:  DO NOT shave, including legs and underarms, starting the day of your first shower.   You may shave your face at any point  before/day of surgery.  Place clean sheets on your bed the day you start using CHG soap. Use a clean washcloth (not used since being washed) for each shower. DO NOT sleep with pets once you start using the CHG.  CHG Shower Instructions:  If you choose to wash your hair and private area, wash first with your normal shampoo/soap.  After you use shampoo/soap, rinse your hair and body thoroughly to remove shampoo/soap residue.  Turn the water  OFF and apply about 3 tablespoons (45 ml) of CHG soap to a CLEAN washcloth.  Apply CHG soap ONLY FROM YOUR NECK DOWN TO YOUR TOES (washing for 3-5 minutes)  DO NOT use CHG soap on face, private areas, open wounds, or sores.  Pay special attention to the area where your surgery is being performed.  If you are having back surgery, having someone wash your back for you may be helpful. Wait 2 minutes after CHG soap is applied, then you may rinse off the CHG soap.  Pat dry with a clean towel  Put on clean clothes/pajamas   If you choose to wear lotion, please use ONLY the CHG-compatible lotions on the back of this paper.     Additional instructions for the day of surgery: DO NOT APPLY any lotions, deodorants, cologne, or perfumes.   Put on clean/comfortable clothes.  Brush your teeth.  Ask your nurse before applying any prescription medications to the skin.   CHG Compatible Lotions   Aveeno Moisturizing lotion  Cetaphil Moisturizing Cream  Cetaphil Moisturizing Lotion  Clairol Herbal Essence Moisturizing Lotion, Dry Skin  Clairol Herbal Essence Moisturizing Lotion, Extra Dry Skin  Clairol Herbal Essence Moisturizing Lotion, Normal Skin  Curel Age Defying Therapeutic Moisturizing Lotion with Alpha Hydroxy  Curel Extreme Care Body Lotion  Curel Soothing Hands Moisturizing Hand Lotion  Curel Therapeutic Moisturizing Cream, Fragrance-Free  Curel Therapeutic Moisturizing Lotion, Fragrance-Free  Curel Therapeutic Moisturizing Lotion, Original Formula   Eucerin Daily Replenishing Lotion  Eucerin Dry Skin Therapy Plus Alpha Hydroxy Crme  Eucerin Dry Skin Therapy Plus Alpha Hydroxy Lotion  Eucerin Original Crme  Eucerin Original Lotion  Eucerin Plus Crme Eucerin Plus Lotion  Eucerin TriLipid Replenishing Lotion  Keri Anti-Bacterial Hand Lotion  Keri Deep Conditioning Original Lotion Dry Skin Formula Softly Scented  Keri Deep Conditioning Original Lotion, Fragrance Free Sensitive Skin Formula  Keri Lotion Fast Absorbing Fragrance Free Sensitive Skin Formula  Keri Lotion Fast Absorbing Softly Scented Dry Skin Formula  Keri Original Lotion  Keri Skin Renewal Lotion Keri Silky Smooth Lotion  Keri Silky Smooth Sensitive Skin Lotion  Nivea Body Creamy Conditioning Oil  Nivea Body Extra Enriched Lotion  Nivea Body Original Lotion  Nivea Body Sheer Moisturizing Lotion Nivea Crme  Nivea Skin Firming Lotion  NutraDerm 30 Skin Lotion  NutraDerm Skin Lotion  NutraDerm Therapeutic Skin Cream  NutraDerm Therapeutic Skin Lotion  ProShield Protective Hand Cream  Provon moisturizing lotion  Incentive Spirometer  An incentive spirometer is a tool that can help keep your lungs clear and active. This tool measures how well you are filling your lungs with each  breath. Taking long deep breaths may help reverse or decrease the chance of developing breathing (pulmonary) problems (especially infection) following: A long period of time when you are unable to move or be active. BEFORE THE PROCEDURE  If the spirometer includes an indicator to show your best effort, your nurse or respiratory therapist will set it to a desired goal. If possible, sit up straight or lean slightly forward. Try not to slouch. Hold the incentive spirometer in an upright position. INSTRUCTIONS FOR USE  Sit on the edge of your bed if possible, or sit up as far as you can in bed or on a chair. Hold the incentive spirometer in an upright position. Breathe out normally. Place the  mouthpiece in your mouth and seal your lips tightly around it. Breathe in slowly and as deeply as possible, raising the piston or the ball toward the top of the column. Hold your breath for 3-5 seconds or for as long as possible. Allow the piston or ball to fall to the bottom of the column. Remove the mouthpiece from your mouth and breathe out normally. Rest for a few seconds and repeat Steps 1 through 7 at least 10 times every 1-2 hours when you are awake. Take your time and take a few normal breaths between deep breaths. The spirometer may include an indicator to show your best effort. Use the indicator as a goal to work toward during each repetition. After each set of 10 deep breaths, practice coughing to be sure your lungs are clear. If you have an incision (the cut made at the time of surgery), support your incision when coughing by placing a pillow or rolled up towels firmly against it. Once you are able to get out of bed, walk around indoors and cough well. You may stop using the incentive spirometer when instructed by your caregiver.  RISKS AND COMPLICATIONS Take your time so you do not get dizzy or light-headed. If you are in pain, you may need to take or ask for pain medication before doing incentive spirometry. It is harder to take a deep breath if you are having pain. AFTER USE Rest and breathe slowly and easily. It can be helpful to keep track of a log of your progress. Your caregiver can provide you with a simple table to help with this. If you are using the spirometer at home, follow these instructions: SEEK MEDICAL CARE IF:  You are having difficultly using the spirometer. You have trouble using the spirometer as often as instructed. Your pain medication is not giving enough relief while using the spirometer. You develop fever of 100.5 F (38.1 C) or higher. SEEK IMMEDIATE MEDICAL CARE IF:  You cough up bloody sputum that had not been present before. You develop fever of 102 F  (38.9 C) or greater. You develop worsening pain at or near the incision site. MAKE SURE YOU:  Understand these instructions. Will watch your condition. Will get help right away if you are not doing well or get worse. Document Released: 08/29/2006 Document Revised: 07/11/2011 Document Reviewed: 10/30/2006 [facility] Patient Information 2014 [place], [place].   ________________________________________________________________________

## 2024-05-16 NOTE — Therapy (Signed)
 " OUTPATIENT PHYSICAL THERAPY SHOULDER    Patient Name: Veronica Galvan MRN: [id number] DOB:[birth year], 69 y.o., female Today's Date: 05/16/2024  END OF SESSION:  PT End of Session - 05/16/24 1526     Visit Number 30    Date for Recertification  05/17/24    Authorization Type [employer] [place]    PT Start Time 1536    PT Stop Time 1630    PT Time Calculation (min) 54 min    Activity Tolerance Patient tolerated treatment well    Behavior During Therapy WFL for tasks assessed/performed            Past Medical History:  Diagnosis Date   Allergy     Anxiety    Arthritis    hands   Cataract    Diabetes mellitus without complication (HCC)    type 2   Family history of adverse reaction to anesthesia    son has malignant hyperthermia, daughter does not daughter recently had c section without problems   GERD (gastroesophageal reflux disease)    Headache    sinus   Hyperlipidemia    Hypertension    PONV (postoperative nausea and vomiting)    nausea only   Ulcer, stomach peptic yrs ago   Past Surgical History:  Procedure Laterality Date   APPENDECTOMY     both hells bone spur repair     both heels with metal clips   both shoulder rotator cuff repair     CESAREAN SECTION     x 1   COLONOSCOPY WITH PROPOFOL  N/A 09/07/2016   Procedure: COLONOSCOPY WITH PROPOFOL ;  Surgeon: Vicci Gladis POUR, MD;  Location: [facility];  Service: Endoscopy;  Laterality: N/A;   colonscopy  06/2011   polyps   ELBOW FRACTURE SURGERY Left    EYE SURGERY     FRACTURE SURGERY     REVERSE SHOULDER ARTHROPLASTY Right 11/10/2022   Procedure: REVERSE SHOULDER ARTHROPLASTY;  Surgeon: Melita Drivers, MD;  Location: [facility];  Service: Orthopedics;  Laterality: Right;  Please follow in room 6 if able   TUBAL LIGATION     VESICO-VAGINAL FISTULA REPAIR     Patient Active Problem List   Diagnosis Date Noted   Aortic atherosclerosis 02/07/2024   Chronic right shoulder pain 02/07/2024    Gastroesophageal reflux disease 11/16/2021   Asymptomatic varicose veins of bilateral lower extremities 08/18/2021   Dermatofibroma 08/18/2021   History of malignant neoplasm of skin 08/18/2021   Lentigo 08/18/2021   Melanocytic nevi of trunk 08/18/2021   Nevus lipomatosus cutaneous superficialis 08/18/2021   Rosacea 08/18/2021   Actinic keratosis 08/18/2021   Sensorineural hearing loss (SNHL) of left ear with unrestricted hearing of right ear 07/26/2021   Tinnitus of left ear 07/26/2021   Acute recurrent maxillary sinusitis 06/22/2021   Primary hypertension 01/12/2021   Hyperlipidemia associated with type 2 diabetes mellitus (HCC) 01/12/2021   Arthritis 01/12/2021   Type II diabetes mellitus (HCC) 11/25/2019   Ingrown toenail 09/05/2018   Neck pain 01/29/2018   Tick bite 10/24/2017    PCP: [name], [name]  REFERRING PROVIDER:[name], MD  REFERRING DIAG: s/p right reverse TSA with complications  THERAPY DIAG:  Muscle weakness (generalized)  Cervicalgia  Chronic right shoulder pain  Stiffness of right shoulder, not elsewhere classified  Acute pain of left shoulder  Spasm  Rationale for Evaluation and Treatment: Rehabilitation  ONSET DATE: 01/01/24  SUBJECTIVE:  SUBJECTIVE STATEMENT:  Patient got an injection in the left shoulder, reports no relief yet, reports that she has been very busy  Evaluation:  Patient had a fall on her deck, tripped on a board that was sticking up and had a severe fracture of the right proximal humerus, 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 We saw her in PT for many visits.  She had good progress but during a very busy time from Thanksgiving to [place] Years she started having pain and some regression of motion.  She has had x-rays, bone scan and a CT.   She saw [name] after the CT.  Feels like it could be an infection, PT vs a revision.   11/30/23 Patient reports that she saw the surgeon she is frustrated and upset as she feels that she still has no answers, it is felt that there is no loosening and no infection.  She is still hurting with basic ADL's, her AROM decreased beginning in March, as she started having increased pain in mid January after a very busy and active few month, she has an order for the left shoulder and the okay for us  to see her for her right shoulder for another month PAIN:  Are you having pain? Yes: NPRS scale: 7/10 Pain location: right shoulder, HA, left shoulder pain up to 8/10 with motions Pain description: dull ache at rest, sharp with motions Aggravating factors: quick motions, movements pain up to 10/10 Relieving factors: ice, rest, Tylenol  at best 3/10  PRECAUTIONS: none  RED FLAGS: None   WEIGHT BEARING RESTRICTIONS: No  FALLS:  Has patient fallen in last 6 months? no  LIVING ENVIRONMENT: Lives with: lives alone Lives in: House/apartment Stairs: No Has following equipment at home: None  OCCUPATION: retired  PLOF: Independent, very active with grandkids, does 5 [facility] a week [employer]  PATIENT GOALS:dress without difficulty, do hair, have good ROM and less pain  NEXT MD VISIT: Mid November  OBJECTIVE:   DIAGNOSTIC FINDINGS:  See above  PATIENT SURVEYS:    COGNITION: Overall cognitive status: Within functional limits for tasks assessed     SENSATION: WFL  POSTURE: Fwd head, rounded shoulders, elevated and guarded shoulder  UPPER EXTREMITY ROM:   Active ROM Right AROM  eval Left AROM Eval Right AROM 02/22/24 Right  AROM 03/20/24                  AROM 11/30/23  Shoulder flexion 105 125 112 118                  R 120  Shoulder extension                        Shoulder abduction 85 100 90 93                  Right 100  Shoulder adduction                        Shoulder  internal rotation 0 30  10                  Right 28  Shoulder external rotation 46 65  j                    Right 60  Elbow flexion                        Elbow extension                        Wrist flexion                        Wrist extension                        Wrist ulnar deviation                        Wrist radial deviation                        Wrist pronation                        Wrist supination                        (Blank rows = not tested)  UPPER EXTREMITY MMT:  In the available ROM  MMT Right eval Left eval  Shoulder flexion 4- 4-  Shoulder extension    Shoulder abduction    Shoulder adduction    Shoulder internal rotation 4+ 4+  Shoulder external rotation 4-P! 4  Middle trapezius    Lower trapezius    Elbow flexion    Elbow extension    Wrist flexion    Wrist extension    Wrist ulnar deviation    Wrist radial deviation    Wrist pronation    Wrist supination    Grip strength (lbs)    (Blank rows = not tested)  PALPATION:  Very tight and tender in the pectoral, upper trap, the entire right upper arm, slight warmth in the right anterior lateral arm  Positive impingement on the right   TODAY'S TREATMENT:                                                                                                                                          DATE:  05/16/24 Walking with nice arm swing x 2 laps Passive stretch bilateral shoulders STM to the upper traps, neck and upper arms Vaso medium pressure 34 degrees   05/14/24 UBE level 3 x 6 minutes Wall slides and circles  15# rows 15# lats 5# chest press 5# shrugs with upper trap and levator stretches Ball vs wall approximation with circles PROM bilateral shoulders Vaso left shoulder  05/09/24 UBE level 3 x 5 minutes Wall slides and circles  Red tband row and  extension Red tband ER/IR PROM bilateral shoulders STM to the left upper arm, upper trap and neck Vaso left shoulder   05/07/24 UBE level 3 x 5 minutes Red tband left shoulder ER/IR Red tband left shoulder row and extension Wall slides both arms Shrugs PROM bilateral shoulders STM to the left shoulder upper arm and bilateral upper traps and neck Vaso 34 degrees both shoulders   04/30/24 UBE 6 minutes PROM bilateral shoulders all motions to her pain tolerance STM to the left upper arm, the neck, upper traps and the rhomboids Vaso to the left shoulder medium pressure 34 degrees this was performed after OT treatment  04/23/24 UBE x 4 minutes Passive stretch bilateral shoulders STM to the upper arms very tender deltoid and biceps STM to the neck, upper traps and rhomboids Vaso left shoulder  04/18/24 Passive stretch LE's Passive stretch UE's STM to the LE's STM to the upper arms, the upper traps and the rhomboids and neck Vaso bilateral shoulders  04/16/24 Passive stretch LE's STM to LE's STM to the right upper trap, neck and rhomboid Passive stretch to the right shoulder Vaso right shoulder  04/11/24 Passive LE stretches STM to the LE's Passive ROM bilateral UE's STM to the right upper trap, neck, rhomboid and upper arm Right vaso 34 degrees  04/10/24 Passive  stretch bilateral UE's Passive stretch LE's STM to the upper traps, neck and the bilateral UE's very tender today  04/04/24 Passive stretch of the LE's  Passive stretch of the UE's STM to the LE's STM to the upper traps and rhomboids Vaso to the left shoulder  04/03/24 Nustep level 4 x 5 minutes Passive stretch shoulders bilaterally STM to the neck, upper traps and rhomboids Vaso 34 degrees right shoulder  03/26/24 UBE L 2 each way Passive stretch bilateral UE's STM to the upper traps, neck and rhomboids, some MFR to knots in the upper traps Vaso 34 degrees   PATIENT EDUCATION: Education details: poc Person educated: Patient Education method: Programmer, Multimedia, Facilities Manager, Actor cues, Verbal cues, and Handouts Education comprehension: verbalized understanding  HOME EXERCISE PROGRAM:  ASSESSMENT:  CLINICAL IMPRESSION:  Patient reports hurting, had injection this week but also reports being very active and busy, reports that she is hurting but describes doing a lot of cooking and lifting , her right TSA is scheduled for 05/29/24. I tried to work on functional strength today, will need to remeasure and see how the left arm is after the injection   Patient reports that she is doing okay, she reports that she is trying to back off of some activities and break things down over time to ease the stress and strain thinks this is helping as she is making soup for the community and is doing this over a few days instead of all at one time..She has a lot of knots in the upper traps, neck and rhomboids, her ROM is improving.  She is having left shoulder pain as well due to it compensating so much, she has worries about this and needing the left shoulder to be strong so when she gets the right TSA she can have a good shoulder  Evaluation:  Patient is very frustrated, she had the right reverse TSA last July.  She did very well until around Christmas, then she really started having increased  pain and lost some ROM.  She has had blood work done, US , bone scan and CT performed.  There has been some talk of a possible infection, MD feels  like she may benefit from a revision.  She would like to try PT again and the MD agreed.  She has lost ROM since I last saw her, she also is having some significant left shoulder pain from over using it.   July 2025: Patient saw surgeon, the bone scan said did not feel that there was infection or loosening however the surgeon is not so sure, he wants to do an US  to be sure, that is scheduled for the next few weeks, since she has not been to PT in a month I re-measured her ROM, she has lost at least 15 degrees of motion and more for all motions.  She is also having more pain in the left shoulder possibly from overdoing it, because she is trying to not use the right shoulder.    OBJECTIVE IMPAIRMENTS: cardiopulmonary status limiting activity, decreased activity tolerance, decreased endurance, decreased ROM, decreased strength, increased edema, increased muscle spasms, impaired flexibility, impaired UE functional use, improper body mechanics, postural dysfunction, and pain.  REHAB POTENTIAL: Good  CLINICAL DECISION MAKING: Evolving/moderate complexity  EVALUATION COMPLEXITY: Low   GOALS: Goals reviewed with patient? Yes  SHORT TERM GOALS: Target date: 01/31/24  Independent with initial HEP Goal status: met 01/31/24  LONG TERM GOALS: Target date: 04/15/24  Decrease pain 50% Goal status: progressing 03/20/24 and 03/26/24, 04/11/24 2.  Dress without difficulty Goal status: progressing 03/20/24 and 03/26/24, met 05/07/24 3.  Do hair without difficulty Goal status: progressing 03/14/24 and 03/26/24 4.  Increase AROM right shoulder flexion to 130 degrees Goal status:  5.  Increase right shoulder ER to 60 degrees Goal status: progressing 03/14/24, 04/18/24  6.  Return to water  aerobics and or gym activity Goal status: on going 02/26/24  and 03/26/24  progressing 04/30/24  PLAN:  PT FREQUENCY: 1-2x/week  PT DURATION: 12 weeks  PLANNED INTERVENTIONS: Therapeutic exercises, Therapeutic activity, Neuromuscular re-education, Balance training, Gait training, Patient/Family education, Self Care, Joint mobilization, Dry Needling, Electrical stimulation, Cryotherapy, Vasopneumatic device, and Manual therapy  PLAN FOR NEXT SESSION: Measure ROM next week  05/16/2024, 3:27 PM [facility] [address] [facility] [place], [place], [address] Phone: [phone]   Fax:  [phone] "

## 2024-05-17 ENCOUNTER — Encounter (HOSPITAL_COMMUNITY)
Admission: RE | Admit: 2024-05-17 | Discharge: 2024-05-17 | Disposition: A | Discharge status: Home / Self Care | Source: Ambulatory Visit | Attending: Orthopaedic Surgery | Admitting: Orthopaedic Surgery

## 2024-05-17 ENCOUNTER — Encounter (HOSPITAL_COMMUNITY): Payer: Self-pay

## 2024-05-17 ENCOUNTER — Other Ambulatory Visit: Payer: Self-pay

## 2024-05-17 VITALS — BP 159/67 | HR 93 | Temp 98.2°F | Ht 61.0 in | Wt 187.0 lb

## 2024-05-17 DIAGNOSIS — I10 Essential (primary) hypertension: Secondary | ICD-10-CM | POA: Diagnosis not present

## 2024-05-17 DIAGNOSIS — Z01818 Encounter for other preprocedural examination: Secondary | ICD-10-CM | POA: Diagnosis present

## 2024-05-17 DIAGNOSIS — E1165 Type 2 diabetes mellitus with hyperglycemia: Secondary | ICD-10-CM | POA: Diagnosis not present

## 2024-05-17 HISTORY — DX: Pneumonia, unspecified organism: J18.9

## 2024-05-17 HISTORY — DX: Atherosclerosis of aorta: I70.0

## 2024-05-17 LAB — BASIC METABOLIC PANEL WITH GFR
Anion gap: 13 (ref 5–15)
BUN: 18 mg/dL (ref 8–23)
CO2: 24 mmol/L (ref 22–32)
Calcium: 10.2 mg/dL (ref 8.9–10.3)
Chloride: 104 mmol/L (ref 98–111)
Creatinine, Ser: 0.68 mg/dL (ref 0.44–1.00)
GFR, Estimated: >60 mL/min
Glucose, Bld: 150 mg/dL — ABNORMAL HIGH (ref 70–99)
Potassium: 5.3 mmol/L — ABNORMAL HIGH (ref 3.5–5.1)
Sodium: 141 mmol/L (ref 135–145)

## 2024-05-17 LAB — CBC
HCT: 44 % (ref 36.0–46.0)
Hemoglobin: 13.7 g/dL (ref 12.0–15.0)
MCH: 26.8 pg (ref 26.0–34.0)
MCHC: 31.1 g/dL (ref 30.0–36.0)
MCV: 85.9 fL (ref 80.0–100.0)
Platelets: 363 K/uL (ref 150–400)
RBC: 5.12 MIL/uL — ABNORMAL HIGH (ref 3.87–5.11)
RDW: 15.1 % (ref 11.5–15.5)
WBC: 10.7 K/uL — ABNORMAL HIGH (ref 4.0–10.5)
nRBC: 0 % (ref 0.0–0.2)

## 2024-05-17 LAB — SURGICAL PCR SCREEN
MRSA, PCR: NEGATIVE
Staphylococcus aureus: NEGATIVE

## 2024-05-17 LAB — GLUCOSE, CAPILLARY: Glucose-Capillary: 160 mg/dL — ABNORMAL HIGH (ref 70–99)

## 2024-05-17 LAB — TYPE AND SCREEN
ABO/RH(D): O POS
Antibody Screen: NEGATIVE

## 2024-05-17 NOTE — Progress Notes (Signed)
 For Anesthesia: PCP - Almarie [name] NOVAK, NP  Cardiologist - Pietro Redell RAMAN, MD LOV: 02/15/24  Bowel Prep reminder:  Chest x-ray - 04/08/24  CT cardio: 02/18/24 EKG - 02/15/24 Stress Test -  ECHO -  Cardiac Cath -  Pacemaker/ICD device last checked: Pacemaker orders received: Device Rep notified:  Spinal Cord Stimulator:N/A  Sleep Study - N/A CPAP -   Fasting Blood Sugar - N/A Checks Blood Sugar __0___ times a day Date and result of last Hgb A1c-7.1: 04/17/24  Last dose of GLP1 agonist- semaglutide  GLP1 instructions: Hold 7 days prior to schedule (Hold 24 hours-daily) After: 05/21/24   Last dose of SGLT-2 inhibitors- synjardy  SGLT-2 instructions: Hold 72 hours prior to surgery: after: 05/25/24  Blood Thinner Instructions:N/A Last Dose: Time last taken:  Aspirin Instructions:N/A Last Dose: Time last taken:  Activity level: Can go up a flight of stairs and activities of daily living without stopping and without chest pain and/or shortness of breath   Able to exercise without chest pain and/or shortness of breath  Anesthesia review:Hx: HTN, aortic atherosclerosis .  Patient denies shortness of breath, fever, cough and chest pain at PAT appointment   Patient verbalized understanding of instructions that were reviewed over the telephone.

## 2024-05-20 NOTE — Progress Notes (Signed)
 " Case: [id number] Date/Time: 05/29/24 0800   Procedure: REVISION, REVERSE TOTAL ARTHROPLASTY, SHOULDER (Right: Shoulder)   Anesthesia type: General   Diagnosis: Complications due to internal orthopedic device, implant, and graft [T84.9XXA]   Pre-op diagnosis: Complications due to internal orthopedic device, implant   [facility] 06 / [facility]   Surgeons: Cristy Bonner DASEN, MD       DISCUSSION: Veronica Galvan is a 69 yo female with PMH of former smoking, HTN, CAD (by imaging), GERD, PUD, T2DM (A1c 7.1), anxiety, arthritis, PONV.  Prior anesthesia complications includes family hx of malignant hyperthermia. Per notes her son has had MH like reaction. Has not been formally tested due to expense of test. Daughter has had surgery without issue. Patient reports she has never had any issues and has not been tested. Last surgery on 11/10/22 and per notes MH precautions used.   Patient evaluated by Cardiology on 02/15/24 due to HTN and aortic atherosclerosis. Patient denied any symptoms and is active. She was advised to continue medical therapy and CT calcium  score was ordered which showed calcium  score was 86%ile. She was advised to increase her statin dose. Cardiac clearance addressed in 03/07/24 telephone note:  Chart reviewed as part of pre-operative protocol coverage. Patient was recently seen by Dr. Pietro on 02/15/2024. Per Dr. Pietro, patient is okay to proceed with surgery.  LD Synjardy : 1/24  VS: BP (!) 159/67   Pulse 93   Temp 36.8 C (Oral)   Ht 5' 1 (1.549 m)   Wt 84.8 kg   SpO2 96%   BMI 35.33 kg/m   PROVIDERS: Almarie [name] NOVAK, NP   LABS: Labs reviewed: Acceptable for surgery. (all labs ordered are listed, but only abnormal results are displayed)  Labs Reviewed  CBC - Abnormal; Notable for the following components:      Result Value   WBC 10.7 (*)    RBC 5.12 (*)    All other components within normal limits  BASIC METABOLIC PANEL WITH GFR - Abnormal; Notable for the  following components:   Potassium 5.3 (*)    Glucose, Bld 150 (*)    All other components within normal limits  GLUCOSE, CAPILLARY - Abnormal; Notable for the following components:   Glucose-Capillary 160 (*)    All other components within normal limits  SURGICAL PCR SCREEN  TYPE AND SCREEN    CT Calcium  score 02/16/24:  IMPRESSION:   Coronary calcium  score of 243. This was 48 percentile for age-, race-,   and sex-matched controls. Past Medical History:  Diagnosis Date   Allergy     Anxiety    Aortic atherosclerosis    Arthritis    hands   Cataract    Diabetes mellitus without complication (HCC)    type 2   Family history of adverse reaction to anesthesia    son has malignant hyperthermia, daughter does not daughter recently had c section without problems   GERD (gastroesophageal reflux disease)    Headache    sinus   Hyperlipidemia    Hypertension    Pneumonia    PONV (postoperative nausea and vomiting)    nausea only   Ulcer, stomach peptic yrs ago    Past Surgical History:  Procedure Laterality Date   APPENDECTOMY     both hells bone spur repair     both heels with metal clips   both shoulder rotator cuff repair     CESAREAN SECTION     x 1   COLONOSCOPY WITH  PROPOFOL  N/A 09/07/2016   Procedure: COLONOSCOPY WITH PROPOFOL ;  Surgeon: Vicci Gladis POUR, MD;  [facility];  Service: Endoscopy;  Laterality: N/A;   colonscopy  06/2011   polyps   ELBOW FRACTURE SURGERY Left    EYE SURGERY     FRACTURE SURGERY     REVERSE SHOULDER ARTHROPLASTY Right 11/10/2022   Procedure: REVERSE SHOULDER ARTHROPLASTY;  Surgeon: Melita Drivers, MD;  Location[facility];  Service: Orthopedics;  Laterality: Right;  Please follow in room 6 if able   TUBAL LIGATION     VESICO-VAGINAL FISTULA REPAIR      MEDICATIONS:  acetaminophen  (TYLENOL ) 650 MG CR tablet   ALPRAZolam  (XANAX ) 0.5 MG tablet   Ascorbic Acid (VITAMIN C) 1000 MG tablet   aspirin  EC 81 MG tablet    atorvastatin  (LIPITOR) 40 MG tablet   Blood Glucose Monitoring Suppl DEVI   Calcium  Carb-Cholecalciferol (CALCIUM  600+D3 PO)   calcium  carbonate (TUMS - DOSED IN MG ELEMENTAL CALCIUM ) 500 MG chewable tablet   carboxymethylcellulose (REFRESH TEARS) 0.5 % SOLN   celecoxib  (CELEBREX ) 200 MG capsule   Cholecalciferol (VITAMIN D3 PO)   Coenzyme Q10 300 MG CAPS   diclofenac Sodium (VOLTAREN) 1 % GEL   fluticasone  (FLONASE ) 50 MCG/ACT nasal spray   glucose blood test strip   levocetirizine (XYZAL ) 5 MG tablet   lisinopril  (ZESTRIL ) 5 MG tablet   LUTEIN-ZEAXANTHIN PO   MAGNESIUM GLYCINATE PO   Multiple Vitamins-Minerals (MULTIVITAMIN WITH MINERALS) tablet   Omega-3 Fatty Acids (FISH OIL) 1000 MG CAPS   omeprazole  (PRILOSEC) 20 MG capsule   Probiotic Product (PROBIOTIC PO)   RESTASIS 0.05 % ophthalmic emulsion   Semaglutide , 2 MG/DOSE, 8 MG/3ML SOPN   sodium chloride  (MURO 128) 2 % ophthalmic solution   SYNJARDY  XR 12.08-998 MG TB24   TURMERIC PO   UNABLE TO FIND   vitamin E 180 MG (400 UNITS) capsule   No current facility-administered medications for this encounter.   Burnard CHRISTELLA Odis DEVONNA [facility] Stay/Anesthesiology [facility] Phone [phone] 05/28/2024 11:18 AM        "

## 2024-05-21 ENCOUNTER — Ambulatory Visit: Admitting: Physical Therapy

## 2024-05-21 ENCOUNTER — Encounter: Payer: Self-pay | Admitting: Physical Therapy

## 2024-05-21 DIAGNOSIS — M25512 Pain in left shoulder: Secondary | ICD-10-CM

## 2024-05-21 DIAGNOSIS — M6281 Muscle weakness (generalized): Secondary | ICD-10-CM

## 2024-05-21 DIAGNOSIS — M542 Cervicalgia: Secondary | ICD-10-CM

## 2024-05-21 DIAGNOSIS — M25611 Stiffness of right shoulder, not elsewhere classified: Secondary | ICD-10-CM

## 2024-05-21 DIAGNOSIS — R252 Cramp and spasm: Secondary | ICD-10-CM

## 2024-05-21 DIAGNOSIS — G8929 Other chronic pain: Secondary | ICD-10-CM

## 2024-05-21 NOTE — Therapy (Signed)
 " OUTPATIENT PHYSICAL THERAPY SHOULDER    Patient Name: Veronica Galvan MRN: [id number] DOB:[birth year], 69 y.o., female Today's Date: 05/21/2024  END OF SESSION:  PT End of Session - 05/21/24 1015     Visit Number 31    Date for Recertification  06/21/24    Authorization Type [employer] [place]    PT Start Time 1015    PT Stop Time 1125    PT Time Calculation (min) 70 min    Activity Tolerance Patient tolerated treatment well    Behavior During Therapy WFL for tasks assessed/performed            Past Medical History:  Diagnosis Date   Allergy     Anxiety    Aortic atherosclerosis    Arthritis    hands   Cataract    Diabetes mellitus without complication (HCC)    type 2   Family history of adverse reaction to anesthesia    son has malignant hyperthermia, daughter does not daughter recently had c section without problems   GERD (gastroesophageal reflux disease)    Headache    sinus   Hyperlipidemia    Hypertension    Pneumonia    PONV (postoperative nausea and vomiting)    nausea only   Ulcer, stomach peptic yrs ago   Past Surgical History:  Procedure Laterality Date   APPENDECTOMY     both hells bone spur repair     both heels with metal clips   both shoulder rotator cuff repair     CESAREAN SECTION     x 1   COLONOSCOPY WITH PROPOFOL  N/A 09/07/2016   Procedure: COLONOSCOPY WITH PROPOFOL ;  Surgeon: Vicci Gladis POUR, MD;  Location: [facility];  Service: Endoscopy;  Laterality: N/A;   colonscopy  06/2011   polyps   ELBOW FRACTURE SURGERY Left    EYE SURGERY     FRACTURE SURGERY     REVERSE SHOULDER ARTHROPLASTY Right 11/10/2022   Procedure: REVERSE SHOULDER ARTHROPLASTY;  Surgeon: Melita Drivers, MD;  Location: [facility];  Service: Orthopedics;  Laterality: Right;  Please follow in room 6 if able   TUBAL LIGATION     VESICO-VAGINAL FISTULA REPAIR     Patient Active Problem List   Diagnosis Date Noted   Aortic atherosclerosis 02/07/2024   Chronic right  shoulder pain 02/07/2024   Gastroesophageal reflux disease 11/16/2021   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 08/18/2021   Dermatofibroma 08/18/2021   History of malignant neoplasm of skin 08/18/2021   Lentigo 08/18/2021   Melanocytic nevi of trunk 08/18/2021   Nevus lipomatosus cutaneous superficialis 08/18/2021   Rosacea 08/18/2021   Actinic keratosis 08/18/2021   Sensorineural hearing loss (SNHL) of left ear with unrestricted hearing of right ear 07/26/2021   Tinnitus of left ear 07/26/2021   Acute recurrent maxillary sinusitis 06/22/2021   Primary hypertension 01/12/2021   Hyperlipidemia associated with type 2 diabetes mellitus (HCC) 01/12/2021   Arthritis 01/12/2021   Type II diabetes mellitus (HCC) 11/25/2019   Ingrown toenail 09/05/2018   Neck pain 01/29/2018   Tick bite 10/24/2017    PCP: [name], [name]  REFERRING PROVIDER:[name], MD  REFERRING DIAG: s/p right reverse TSA with complications  THERAPY DIAG:  Muscle weakness (generalized)  Cervicalgia  Chronic right shoulder pain  Stiffness of right shoulder, not elsewhere classified  Acute pain of left shoulder  Spasm  Rationale for Evaluation and Treatment: Rehabilitation  ONSET DATE: 01/01/24  SUBJECTIVE:  SUBJECTIVE STATEMENT:  Still with pain in both shoulders and upper arms, as well as some neck pain, surgery is scheduled for next week  Evaluation:  Patient had a fall on her deck, tripped on a board that was sticking up and had a severe fracture of the right proximal humerus, 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 We saw her in PT for many visits.  She had good progress but during a very busy time from Thanksgiving to [employer] she started having pain and some regression of motion.  She  has had x-rays, bone scan and a CT.  She saw [name] after the CT.  Feels like it could be an infection, PT vs a revision.   11/30/23 Patient reports that she saw the surgeon she is frustrated and upset as she feels that she still has no answers, it is felt that there is no loosening and no infection.  She is still hurting with basic ADL's, her AROM decreased beginning in March, as she started having increased pain in mid January after a very busy and active few month, she has an order for the left shoulder and the okay for us  to see her for her right shoulder for another month PAIN:  Are you having pain? Yes: NPRS scale: 7/10 Pain location: right shoulder, HA, left shoulder pain up to 8/10 with motions Pain description: dull ache at rest, sharp with motions Aggravating factors: quick motions, movements pain up to 10/10 Relieving factors: ice, rest, Tylenol  at best 3/10  PRECAUTIONS: none  RED FLAGS: None   WEIGHT BEARING RESTRICTIONS: No  FALLS:  Has patient fallen in last 6 months? no  LIVING ENVIRONMENT: Lives with: lives alone Lives in: House/apartment Stairs: No Has following equipment at home: None  OCCUPATION: retired  PLOF: Independent, very active with grandkids, does 5 [facility] a [facility] [employer]  PATIENT GOALS:dress without difficulty, do hair, have good ROM and less pain  NEXT MD VISIT: Mid November  OBJECTIVE:   DIAGNOSTIC FINDINGS:  See above  PATIENT SURVEYS:    COGNITION: Overall cognitive status: Within functional limits for tasks assessed     SENSATION: WFL  POSTURE: Fwd head, rounded shoulders, elevated and guarded shoulder  UPPER EXTREMITY ROM:   Active ROM Right AROM  eval Left AROM Eval Right AROM 02/22/24 Right  AROM 03/20/24                  AROM 11/30/23  Shoulder flexion 105 125 112 118                  R 120  Shoulder extension                        Shoulder abduction 85 100 90 93                  Right 100  Shoulder  adduction                        Shoulder internal rotation 0 30  10                  Right 28  Shoulder external rotation 46 65  j                    Right 60  Elbow flexion                        Elbow extension                        Wrist flexion                        Wrist extension                        Wrist ulnar deviation                        Wrist radial deviation                        Wrist pronation                        Wrist supination                        (Blank rows = not tested)  UPPER EXTREMITY MMT:  In the available ROM  MMT Right eval Left eval  Shoulder flexion 4- 4-  Shoulder extension    Shoulder abduction    Shoulder adduction    Shoulder internal rotation 4+ 4+  Shoulder external rotation 4-P! 4  Middle trapezius    Lower trapezius    Elbow flexion    Elbow extension    Wrist flexion    Wrist extension    Wrist ulnar deviation    Wrist radial deviation    Wrist pronation    Wrist supination    Grip strength (lbs)    (Blank rows = not tested)  PALPATION:  Very tight and tender in the pectoral, upper trap, the entire right upper arm, slight warmth in the right anterior lateral arm  Positive  impingement on the right   TODAY'S TREATMENT:                                                                                                                                         DATE:  05/21/24 Walking arm swing 2 laps outside Shoulder rolls Retractions cervical and scapular Passive stretch UE's bilaterally STM to the upper traps, neck, rhomboids, upper arms Vaso medium pressure 34 degrees  05/16/24 Walking with nice arm swing x 2 laps Passive stretch bilateral shoulders STM to the upper traps, neck and upper arms Vaso medium pressure 34 degrees   05/14/24 UBE level 3 x 6 minutes Wall slides and circles  15# rows 15# lats 5# chest press 5# shrugs with upper trap  and levator stretches Ball vs wall approximation with circles PROM bilateral shoulders Vaso left shoulder  05/09/24 UBE level 3 x 5 minutes Wall slides and circles  Red tband row and extension Red tband ER/IR PROM bilateral shoulders STM to the left upper arm, upper trap and neck Vaso left shoulder   05/07/24 UBE level 3 x 5 minutes Red tband left shoulder ER/IR Red tband left shoulder row and extension Wall slides both arms Shrugs PROM bilateral shoulders STM to the left shoulder upper arm and bilateral upper traps and neck Vaso 34 degrees both shoulders   04/30/24 UBE 6 minutes PROM bilateral shoulders all motions to her pain tolerance STM to the left upper arm, the neck, upper traps and the rhomboids Vaso to the left shoulder medium pressure 34 degrees this was performed after OT treatment  04/23/24 UBE x 4 minutes Passive stretch bilateral shoulders STM to the upper arms very tender deltoid and biceps STM to the neck, upper traps and rhomboids Vaso left shoulder  04/18/24 Passive stretch LE's Passive stretch UE's STM to the LE's STM to the upper arms, the upper traps and the rhomboids and neck Vaso bilateral shoulders  04/16/24 Passive stretch LE's STM to LE's STM to the right upper  trap, neck and rhomboid Passive stretch to the right shoulder Vaso right shoulder  04/11/24 Passive LE stretches STM to the LE's Passive ROM bilateral UE's STM to the right upper trap, neck, rhomboid and upper arm Right vaso 34 degrees  04/10/24 Passive stretch bilateral UE's Passive stretch LE's STM to the upper traps, neck and the bilateral UE's very tender today  04/04/24 Passive stretch of the LE's  Passive stretch of the UE's STM to the LE's STM to the upper traps and rhomboids Vaso to the left shoulder  04/03/24 Nustep level 4 x 5 minutes Passive stretch shoulders bilaterally STM to the neck, upper traps and rhomboids Vaso 34 degrees right shoulder  03/26/24 UBE L 2 each way Passive stretch bilateral UE's STM to the upper traps, neck and rhomboids, some MFR to knots in the upper traps Vaso 34 degrees   PATIENT EDUCATION: Education details: poc Person educated: Patient Education method: Programmer, Multimedia, Facilities Manager, Actor cues, Verbal cues, and Handouts Education comprehension: verbalized understanding  HOME EXERCISE PROGRAM:  ASSESSMENT:  CLINICAL IMPRESSION:  Patient reports hurting,  , her right TSA is scheduled for 05/29/24. I am working on pain relief and spasm relief getting her ready for the surgery next week.   Patient reports that she is doing okay, she reports that she is trying to back off of some activities and break things down over time to ease the stress and strain thinks this is helping as she is making soup for the community and is doing this over a few days instead of all at one time..She has a lot of knots in the upper traps, neck and rhomboids, her ROM is improving.  She is having left shoulder pain as well due to it compensating so much, she has worries about this and needing the left shoulder to be strong so when she gets the right TSA she can have a good shoulder  Evaluation:  Patient is very frustrated, she had the right reverse TSA  last July.  She did very well until around Christmas, then she really started having increased pain and lost some ROM.  She has had blood work done, US , bone scan and CT performed.  There has been some talk of a possible infection,  MD feels like she may benefit from a revision.  She would like to try PT again and the MD agreed.  She has lost ROM since I last saw her, she also is having some significant left shoulder pain from over using it.   July 2025: Patient saw surgeon, the bone scan said did not feel that there was infection or loosening however the surgeon is not so sure, he wants to do an US  to be sure, that is scheduled for the next few weeks, since she has not been to PT in a month I re-measured her ROM, she has lost at least 15 degrees of motion and more for all motions.  She is also having more pain in the left shoulder possibly from overdoing it, because she is trying to not use the right shoulder.    OBJECTIVE IMPAIRMENTS: cardiopulmonary status limiting activity, decreased activity tolerance, decreased endurance, decreased ROM, decreased strength, increased edema, increased muscle spasms, impaired flexibility, impaired UE functional use, improper body mechanics, postural dysfunction, and pain.  REHAB POTENTIAL: Good  CLINICAL DECISION MAKING: Evolving/moderate complexity  EVALUATION COMPLEXITY: Low   GOALS: Goals reviewed with patient? Yes  SHORT TERM GOALS: Target date: 01/31/24  Independent with initial HEP Goal status: met 01/31/24  LONG TERM GOALS: Target date: 04/15/24  Decrease pain 50% Goal status: progressing 03/20/24 and 03/26/24, 04/11/24 2.  Dress without difficulty Goal status: progressing 03/20/24 and 03/26/24, met 05/07/24 3.  Do hair without difficulty Goal status: progressing 03/14/24 and 03/26/24 4.  Increase AROM right shoulder flexion to 130 degrees Goal status:  5.  Increase right shoulder ER to 60 degrees Goal status: progressing 03/14/24, 04/18/24  6.   Return to water  aerobics and or gym activity Goal status: on going 02/26/24  and 03/26/24 progressing 04/30/24  PLAN:  PT FREQUENCY: 1-2x/week  PT DURATION: 12 weeks  PLANNED INTERVENTIONS: Therapeutic exercises, Therapeutic activity, Neuromuscular re-education, Balance training, Gait training, Patient/Family education, Self Care, Joint mobilization, Dry Needling, Electrical stimulation, Cryotherapy, Vasopneumatic device, and Manual therapy  PLAN FOR NEXT SESSION: Measure ROM next week  05/21/2024, 11:40 AM [place] [facility] [address] [facility] [place], [place], [address] Phone: [phone]   Fax:  [phone] "

## 2024-05-21 NOTE — Therapy (Signed)
 " OUTPATIENT OCCUPATIONAL THERAPY ORTHO  treatment  Patient Name: Veronica Galvan MRN: [id number] DOB:[birth year], 69 y.o., female Today's Date: 05/22/2024  PCP: Almarie Birmingham, NP REFERRING PROVIDER: Almarie Birmingham, NP OCCUPATIONAL THERAPY DISCHARGE SUMMARY   Current functional level related to goals / functional outcomes: Pt met 4/5 short term goals and 3/4 long term goals. She reports improved overall functional use of bilateral UE's since beginning occupational therapy. She demonstrates understanding of activity modification, energy conservation and use of AE.    Remaining deficits: pain, decreased ROM, decreased strength   Education / Equipment: Pt was educated regarding HEP, and activity modification. She demonstrates understanding.   Patient agrees to discharge. Patient goals were partially met. Patient is being discharged due to a change in medical status. Pt is undergoing shoulder surgery next week.   END OF SESSION:  OT End of Session - 05/22/24 1159     Visit Number 13    Date for Recertification  05/15/24    Authorization Type [employer] M[employer]    Authorization - Visit Number 13    OT Start Time 1102    OT Stop Time 1147    OT Time Calculation (min) 45 min    Activity Tolerance Patient tolerated treatment well    Behavior During Therapy WFL for tasks assessed/performed                  Past Medical History:  Diagnosis Date   Allergy     Anxiety    Aortic atherosclerosis    Arthritis    hands   Cataract    Diabetes mellitus without complication (HCC)    type 2   Family history of adverse reaction to anesthesia    son has malignant hyperthermia, daughter does not daughter recently had c section without problems   GERD (gastroesophageal reflux disease)    Headache    sinus   Hyperlipidemia    Hypertension    Pneumonia    PONV (postoperative nausea and vomiting)    nausea only   Ulcer, stomach peptic yrs ago   Past Surgical History:  Procedure Laterality  Date   APPENDECTOMY     both hells bone spur repair     both heels with metal clips   both shoulder rotator cuff repair     CESAREAN SECTION     x 1   COLONOSCOPY WITH PROPOFOL  N/A 09/07/2016   Procedure: COLONOSCOPY WITH PROPOFOL ;  Surgeon: Vicci Gladis POUR, MD;  Location: [facility];  Service: Endoscopy;  Laterality: N/A;   colonscopy  06/2011   polyps   ELBOW FRACTURE SURGERY Left    EYE SURGERY     FRACTURE SURGERY     REVERSE SHOULDER ARTHROPLASTY Right 11/10/2022   Procedure: REVERSE SHOULDER ARTHROPLASTY;  Surgeon: Melita Drivers, MD;  Location: [facility];  Service: Orthopedics;  Laterality: Right;  Please follow in room 6 if able   TUBAL LIGATION     VESICO-VAGINAL FISTULA REPAIR     Patient Active Problem List   Diagnosis Date Noted   Aortic atherosclerosis 02/07/2024   Chronic right shoulder pain 02/07/2024   Gastroesophageal reflux disease 11/16/2021   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 08/18/2021   Dermatofibroma 08/18/2021   History of malignant neoplasm of skin 08/18/2021   Lentigo 08/18/2021   Melanocytic nevi of trunk 08/18/2021   Nevus lipomatosus cutaneous superficialis 08/18/2021   Rosacea 08/18/2021   Actinic keratosis 08/18/2021   Sensorineural hearing loss (SNHL) of left ear with unrestricted hearing  of right ear 07/26/2021   Tinnitus of left ear 07/26/2021   Acute recurrent maxillary sinusitis 06/22/2021   Primary hypertension 01/12/2021   Hyperlipidemia associated with type 2 diabetes mellitus (HCC) 01/12/2021   Arthritis 01/12/2021   Type II diabetes mellitus (HCC) 11/25/2019   Ingrown toenail 09/05/2018   Neck pain 01/29/2018   Tick bite 10/24/2017    ONSET DATE: 02/07/24  REFERRING DIAG: M19.90 (ICD-10-CM) - Arthritis      THERAPY DIAG:  Muscle weakness (generalized)  Chronic right shoulder pain  Stiffness of right shoulder, not elsewhere classified  Acute pain of left shoulder  Pain in right  hand  Pain in left hand  Other lack of coordination  Stiffness of left hand, not elsewhere classified  Stiffness of right hand, not elsewhere classified  Rationale for Evaluation and Treatment: Rehabilitation  SUBJECTIVE:   SUBJECTIVE STATEMENT: Pt reports her surgery is 1 week from today Pt accompanied by: self  PERTINENT HISTORY: 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 Pt was previously seen for OT and d/c 12/06/23  PRECAUTIONS: hx of R reverse toal shoulder, none for hands    WEIGHT BEARING RESTRICTIONS: No  PAIN:  Are you having pain? Yes: NPRS scale: 6/10 Pain location: bilateral hands Pain description: aching Aggravating factors: overuse, waking up in pain Relieving factors: paraffin Pt has R shoulder pain 6-8/10, PT is addressing, OT will not address., FALLS: Has patient fallen in last 6 months? No  LIVING ENVIRONMENT: Lives with: lives alone Lives in: House/apartment   PLOF: Independent  PATIENT GOALS: decrease pain in hands, improve function/ mobility  NEXT MD VISIT: unknown  OBJECTIVE:  Note: Objective measures were completed at Evaluation unless otherwise noted.  HAND DOMINANCE: Right  ADLs:Pt reports increased overall difficulty with ADLs due to pain and stiffness. Pt is mod I with basic ADLs. She has difficulty with buttons, styling hair, opening jars.    FUNCTIONAL OUTCOME MEASURES: Upper Extremity Functional Scale (UEFS): 33/80  UPPER EXTREMITY ROM:   RUE composite finger flexion 70% (3cm from palm at middle finger)     LUE composite finer flexion 60% (3.5cm from palm at middle finger) bilateral UE's grossly 90-95% composite finger extension     HAND FUNCTION: Grip strength: Right: 18 lbs; Left: 15 lbs, Lateral pinch: Right: 10 lbs, Left: 12 lbs, and Tip pinch: Right 4 lbs, Left: 2 lbs  COORDINATION:NT   SENSATION: WFL  EDEMA: Pt with  bony defomities at PIP joints of all digits which pt reports are bone spurs, Pt also has bony deformity at DIP for right thumb, index and 5th digit and LUE small and index fingers   COGNITION: Overall cognitive status: Within functional limits for tasks assessed   OBSERVATIONS: Pleasant female well known to therapist from previous occupational therapy at this site   TREATMENT DATE: 1/21/26Paraffin to right hand x 10 min no adverse reactions while therapist performed soft tissue/ joint mobs to left  hands, fingers  and wrists, forearms as well as P/ROM individual finger flexion, composite finger flexion to digits.  Paraffin to left hand x 10 mins no adverse reactions while RUE soft tissue/ joint mobs to hands, fingers  and wrists, forearms as well as P/ROM individual finger flexion, composite finger flexion to digits.  Pt. performed fine motor coordination task with RUE to place and remove metal pegs from pegboard, min difficulty, and min v.c to avoid compensation Therapist checked progress towards  goals for d/c. Pt is having shoulder surgery next week.     PATIENT EDUCATION: Education details: see above Person educated: Patient Education method: Explanation Education comprehension: verbalized understanding  HOME EXERCISE PROGRAM: yellow putty  GOALS: Goals reviewed with patient? Yes  SHORT TERM GOALS: Target date: 03/23/24  I with inital HEP.  Goal status:  met, putty 05/23/23  2.    Pt will improve RUE tip pinch by 2 lbs(from baseline) for increased ease with daily activities. Revised goal:Pt will improve RUE tip pinch to 7 lbs for increased ease with daily activities.                               baseline: RUE tip 4lbs, LUE tip 2 lbs                          Goal status: met 6 lbs 04/10/24, met 8 # 05/22/24  3.  Pt will improve LUE tip pinch by 2 lbs(from baseline) for increased ease with daily activities. Revised goal:Pt will improve LUE tip pinch to 5 lbs for increased ease  with daily activities. baseline: RUE tip 4lbs, LUE tip 2 lbs Goal status:  met 4lbs 04/10/24, met 6 lbs 05/22/24  4.  I with adapted strategies/ adapted equipment to minimize pain and to increase pt I with ADLs/IADLs   Goal status: met, 05/15/24  5.  Pt will report bilateral hand pain no greater than 4/10 for ADLs/IADLS Baseline: RUE 7/10, LUE 6/10 Goal status:  improved but not fully met, 5/10- 05/23/23      LONG TERM GOALS: Target date: 05/15/24      Pt will improve RUE grip strength to 22 # or greater for increased functional use Baseline: RUE 18# Goal status:  met 25# 05/22/24  2.  Pt will improve LUE grip strength to 25 # or greater for increased functional use Baseline: LUE 21# Goal status:  improved  but not met 22#- 05/22/24   3.Pt will demonstrate improved composite finger flexion for ADLs as evidenced by pt. bringing middle fingertip for RUE within 2.75 cm of palm.             Baseline: 3 cm from palm to middle fingertip             Goal status: met today,  pt demonstrates 2 cm from palm today, met 04/10/24- will monitor for consistency 2 cm met 1/121/26   4. .Pt will demonstrate improved composite finger flexion for ADLs as evidenced by pt. bringing middle fingertip for LUE within 3.25 of palm.             Baseline: 3.5 cm from palm to middle fingertip             Goal status: met today, pt demonstrates 2 cm from palm today, met 04/10/24- will monitor for consistency, met 3 cm    5. check 9 hole peg test and set goals prn- deferred 04/10/24 ASSESSMENT:  CLINICAL IMPRESSION: Pt met 4/5 short term goals and 3/4 long term goals. She reports improved overall functional use of bilateral UE's since beginning occupational therapy. She demonstrates understanding of activity modification, energy conservation and use of AE.  SABRAPERFORMANCE DEFICITS: in functional skills including ADLs, IADLs, coordination, dexterity, proprioception, sensation, edema, ROM, strength, pain,  flexibility, Fine motor control, Gross motor control, endurance, decreased knowledge of precautions, decreased knowledge of use of DME, and  UE functional use,and psychosocial skills including coping strategies, environmental adaptation, habits, interpersonal interactions, and routines and behaviors.   IMPAIRMENTS: are limiting patient from ADLs, IADLs, rest and sleep, play, leisure, and social participation.   COMORBIDITIES: may have co-morbidities  that affects occupational performance. Patient will benefit from skilled OT to address above impairments and improve overall function.  MODIFICATION OR ASSISTANCE TO COMPLETE EVALUATION: No modification of tasks or assist necessary to complete an evaluation.  OT OCCUPATIONAL PROFILE AND HISTORY: Detailed assessment: Review of records and additional review of physical, cognitive, psychosocial history related to current functional performance.  CLINICAL DECISION MAKING: LOW - limited treatment options, no task modification necessary  REHAB POTENTIAL: Good  EVALUATION COMPLEXITY: Low      PLAN:  OT FREQUENCY: 1-2x/week  OT DURATION: 12 weeks  PLANNED INTERVENTIONS: 97168 OT Re-evaluation, 97535 self care/ADL training, 02889 therapeutic exercise, 97530 therapeutic activity, 97112 neuromuscular re-education, 97140 manual therapy, 97035 ultrasound, 97018 paraffin, 02989 moist heat, 97010 cryotherapy, 97014 electrical stimulation unattended, 97760 Orthotic Initial, 97763 Orthotic/Prosthetic subsequent, scar mobilization, passive range of motion, functional mobility training, energy conservation, coping strategies training, patient/family education, and DME and/or AE instructions  RECOMMENDED OTHER SERVICES: n/a  CONSULTED AND AGREED WITH PLAN OF CARE: Patient  PLAN FOR NEXT SESSION:,  d/c OT, pt is having shouler surgery next week   Bow Buntyn, OT 05/22/2024, 12:02 PM   "

## 2024-05-22 ENCOUNTER — Ambulatory Visit: Admitting: Occupational Therapy

## 2024-05-22 DIAGNOSIS — M6281 Muscle weakness (generalized): Secondary | ICD-10-CM | POA: Diagnosis not present

## 2024-05-22 DIAGNOSIS — R278 Other lack of coordination: Secondary | ICD-10-CM

## 2024-05-22 DIAGNOSIS — G8929 Other chronic pain: Secondary | ICD-10-CM

## 2024-05-22 DIAGNOSIS — M25611 Stiffness of right shoulder, not elsewhere classified: Secondary | ICD-10-CM

## 2024-05-22 DIAGNOSIS — M25642 Stiffness of left hand, not elsewhere classified: Secondary | ICD-10-CM

## 2024-05-22 DIAGNOSIS — M25641 Stiffness of right hand, not elsewhere classified: Secondary | ICD-10-CM

## 2024-05-22 DIAGNOSIS — M25512 Pain in left shoulder: Secondary | ICD-10-CM

## 2024-05-22 DIAGNOSIS — M79642 Pain in left hand: Secondary | ICD-10-CM

## 2024-05-22 DIAGNOSIS — M79641 Pain in right hand: Secondary | ICD-10-CM

## 2024-05-23 ENCOUNTER — Ambulatory Visit: Admitting: Physical Therapy

## 2024-05-23 ENCOUNTER — Encounter: Payer: Self-pay | Admitting: Physical Therapy

## 2024-05-23 DIAGNOSIS — M25512 Pain in left shoulder: Secondary | ICD-10-CM

## 2024-05-23 DIAGNOSIS — M6281 Muscle weakness (generalized): Secondary | ICD-10-CM | POA: Diagnosis not present

## 2024-05-23 DIAGNOSIS — M542 Cervicalgia: Secondary | ICD-10-CM

## 2024-05-23 DIAGNOSIS — R252 Cramp and spasm: Secondary | ICD-10-CM

## 2024-05-23 DIAGNOSIS — M25611 Stiffness of right shoulder, not elsewhere classified: Secondary | ICD-10-CM

## 2024-05-23 DIAGNOSIS — G8929 Other chronic pain: Secondary | ICD-10-CM

## 2024-05-23 NOTE — Therapy (Signed)
 " OUTPATIENT PHYSICAL THERAPY SHOULDER    Patient Name: Veronica Galvan MRN: [id number] DOB:1955-09-24, 69 y.o., female Today's Date: 05/23/2024  END OF SESSION:  PT End of Session - 05/23/24 0927     Visit Number 32    Date for Recertification  06/21/24    Authorization Type [employer]    PT Start Time [phone]    PT Stop Time 1033    PT Time Calculation (min) 66 min    Activity Tolerance Patient tolerated treatment well    Behavior During Therapy WFL for tasks assessed/performed            Past Medical History:  Diagnosis Date   Allergy     Anxiety    Aortic atherosclerosis    Arthritis    hands   Cataract    Diabetes mellitus without complication (HCC)    type 2   Family history of adverse reaction to anesthesia    son has malignant hyperthermia, daughter does not daughter recently had c section without problems   GERD (gastroesophageal reflux disease)    Headache    sinus   Hyperlipidemia    Hypertension    Pneumonia    PONV (postoperative nausea and vomiting)    nausea only   Ulcer, stomach peptic yrs ago   Past Surgical History:  Procedure Laterality Date   APPENDECTOMY     both hells bone spur repair     both heels with metal clips   both shoulder rotator cuff repair     CESAREAN SECTION     x 1   COLONOSCOPY WITH PROPOFOL  N/A 09/07/2016   Procedure: COLONOSCOPY WITH PROPOFOL ;  Surgeon: Vicci Gladis POUR, MD;  Location: [facility];  Service: Endoscopy;  Laterality: N/A;   colonscopy  06/2011   polyps   ELBOW FRACTURE SURGERY Left    EYE SURGERY     FRACTURE SURGERY     REVERSE SHOULDER ARTHROPLASTY Right 11/10/2022   Procedure: REVERSE SHOULDER ARTHROPLASTY;  Surgeon: Melita Drivers, MD;  Location: [facility];  Service: Orthopedics;  Laterality: Right;  Please follow in room 6 if able   TUBAL LIGATION     VESICO-VAGINAL FISTULA REPAIR     Patient Active Problem List   Diagnosis Date Noted   Aortic atherosclerosis 02/07/2024   Chronic right  shoulder pain 02/07/2024   Gastroesophageal reflux disease 11/16/2021   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 08/18/2021   Dermatofibroma 08/18/2021   History of malignant neoplasm of skin 08/18/2021   Lentigo 08/18/2021   Melanocytic nevi of trunk 08/18/2021   Nevus lipomatosus cutaneous superficialis 08/18/2021   Rosacea 08/18/2021   Actinic keratosis 08/18/2021   Sensorineural hearing loss (SNHL) of left ear with unrestricted hearing of right ear 07/26/2021   Tinnitus of left ear 07/26/2021   Acute recurrent maxillary sinusitis 06/22/2021   Primary hypertension 01/12/2021   Hyperlipidemia associated with type 2 diabetes mellitus (HCC) 01/12/2021   Arthritis 01/12/2021   Type II diabetes mellitus (HCC) 11/25/2019   Ingrown toenail 09/05/2018   Neck pain 01/29/2018   Tick bite 10/24/2017    PCP: [name], [name]  REFERRING PROVIDER:[name], MD  REFERRING DIAG: s/p right reverse TSA with complications  THERAPY DIAG:  Muscle weakness (generalized)  Chronic right shoulder pain  Stiffness of right shoulder, not elsewhere classified  Acute pain of left shoulder  Cervicalgia  Spasm  Rationale for Evaluation and Treatment: Rehabilitation  ONSET DATE: 01/01/24  SUBJECTIVE:  SUBJECTIVE STATEMENT:  Reports that having pain and a knot in the right upper trap and rhomboid, surgery is next Wednesday  Evaluation:  Patient had a fall on her deck, tripped on a board that was sticking up and had a severe fracture of the right proximal humerus, 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 We saw her in PT for many visits.  She had good progress but during a very busy time from Thanksgiving to [place] Years she started having pain and some regression of motion.  She has had  x-rays, bone scan and a CT.  She saw [name] after the CT.  Feels like it could be an infection, PT vs a revision.   11/30/23 Patient reports that she saw the surgeon she is frustrated and upset as she feels that she still has no answers, it is felt that there is no loosening and no infection.  She is still hurting with basic ADL's, her AROM decreased beginning in March, as she started having increased pain in mid January after a very busy and active few month, she has an order for the left shoulder and the okay for us  to see her for her right shoulder for another month PAIN:  Are you having pain? Yes: NPRS scale: 7/10 Pain location: right shoulder, HA, left shoulder pain up to 8/10 with motions Pain description: dull ache at rest, sharp with motions Aggravating factors: quick motions, movements pain up to 10/10 Relieving factors: ice, rest, Tylenol  at best 3/10  PRECAUTIONS: none  RED FLAGS: None   WEIGHT BEARING RESTRICTIONS: No  FALLS:  Has patient fallen in last 6 months? no  LIVING ENVIRONMENT: Lives with: lives alone Lives in: House/apartment Stairs: No Has following equipment at home: None  OCCUPATION: retired  PLOF: Independent, very active with grandkids, does 5 classes a week [employer]  PATIENT GOALS:dress without difficulty, do hair, have good ROM and less pain  NEXT MD VISIT: Mid November  OBJECTIVE:   DIAGNOSTIC FINDINGS:  See above  PATIENT SURVEYS:    COGNITION: Overall cognitive status: Within functional limits for tasks assessed     SENSATION: WFL  POSTURE: Fwd head, rounded shoulders, elevated and guarded shoulder  UPPER EXTREMITY ROM:   Active ROM Right AROM  eval Left AROM Eval Right AROM 02/22/24 Right  AROM 03/20/24 Right  AROM 05/23/24 Left  AROM 05/23/24                AROM 11/30/23  Shoulder flexion 105 125 112 118 120 120                R 120  Shoulder extension                        Shoulder abduction 85 100 90 93 90 100                 Right 100  Shoulder adduction                        Shoulder internal rotation 0 30  10 10  45                Right 28  Shoulder external rotation 46 65  j   60 80                Right 60  Elbow flexion                        Elbow extension                        Wrist flexion                        Wrist extension                        Wrist ulnar deviation                        Wrist radial deviation                        Wrist pronation                        Wrist supination                        (Blank rows = not tested)  UPPER EXTREMITY MMT:  In the available ROM  MMT Right eval Left eval  Shoulder flexion 4- 4-  Shoulder extension    Shoulder abduction    Shoulder adduction    Shoulder internal rotation 4+ 4+  Shoulder external rotation 4-P! 4  Middle trapezius    Lower trapezius    Elbow flexion    Elbow extension    Wrist flexion    Wrist extension    Wrist ulnar deviation    Wrist radial deviation    Wrist pronation    Wrist supination    Grip strength (lbs)    (Blank rows = not tested)  PALPATION:  Very tight and tender in the pectoral, upper trap, the entire right upper arm, slight warmth in the  right anterior lateral arm  Positive impingement on the right   TODAY'S TREATMENT:                                                                                                                                         DATE:  05/23/24 Walking with arm swing 2 laps AROM measured as noted above Passive stretch STM to the neck, right upper trap, rhomboid Vaso left shoulder medium pressure  05/21/24 Walking arm swing 2 laps outside Shoulder rolls Retractions cervical and scapular Passive stretch UE's bilaterally STM to the upper traps, neck, rhomboids, upper arms Vaso medium pressure 34 degrees  05/16/24 Walking with nice arm swing x 2 laps Passive stretch bilateral shoulders STM to the upper traps, neck and upper arms Vaso medium pressure  34 degrees   05/14/24 UBE level 3 x 6 minutes Wall slides and circles  15# rows 15# lats 5# chest press 5# shrugs with upper trap and levator stretches Ball vs wall approximation with circles PROM bilateral shoulders Vaso left shoulder  05/09/24 UBE level 3 x 5 minutes Wall slides and circles  Red tband row and extension Red tband ER/IR PROM bilateral shoulders STM to the left upper arm, upper trap and neck Vaso left shoulder   05/07/24 UBE level 3 x 5 minutes Red tband left shoulder ER/IR Red tband left shoulder row and extension Wall slides both arms Shrugs PROM bilateral shoulders STM to the left shoulder upper arm and bilateral upper traps and neck Vaso 34 degrees both shoulders   04/30/24 UBE 6 minutes PROM bilateral shoulders all motions to her pain tolerance STM to the left upper arm, the neck, upper traps and the rhomboids Vaso to the left shoulder medium pressure 34 degrees this was performed after OT treatment  04/23/24 UBE x 4 minutes Passive stretch bilateral shoulders STM to the upper arms very tender deltoid and biceps STM to the neck, upper traps and rhomboids Vaso left shoulder  04/18/24 Passive stretch  LE's Passive stretch UE's STM to the LE's STM to the upper arms, the upper traps and the rhomboids and neck Vaso bilateral shoulders  04/16/24 Passive stretch LE's STM to LE's STM to the right upper trap, neck and rhomboid Passive stretch to the right shoulder Vaso right shoulder  04/11/24 Passive LE stretches STM to the LE's Passive ROM bilateral UE's STM to the right upper trap, neck, rhomboid and upper arm Right vaso 34 degrees  04/10/24 Passive stretch bilateral UE's Passive stretch LE's STM to the upper traps, neck and the bilateral UE's very tender today  04/04/24 Passive stretch of the LE's  Passive stretch of the UE's STM to the LE's STM to the upper traps and rhomboids Vaso to the left shoulder  04/03/24 Nustep level 4 x 5 minutes Passive stretch shoulders bilaterally STM to the neck, upper traps and rhomboids Vaso 34 degrees right shoulder  03/26/24 UBE L 2 each way Passive stretch bilateral UE's STM to the upper traps, neck and rhomboids, some MFR to knots in the upper traps Vaso 34 degrees   PATIENT EDUCATION: Education details: poc Person educated: Patient Education method: Programmer, Multimedia, Facilities Manager, Actor cues, Verbal cues, and Handouts Education comprehension: verbalized understanding  HOME EXERCISE PROGRAM:  ASSESSMENT:  CLINICAL IMPRESSION:  Patient reports hurting,  , her right TSA is scheduled for 05/29/24. I measured the right and left shoulder for a preop measurement.  Still with knots in the upper trap, neck and rhomboid   Patient reports that she is doing okay, she reports that she is trying to back off of some activities and break things down over time to ease the stress and strain thinks this is helping as she is making soup for the community and is doing this over a few days instead of all at one time..She has a lot of knots in the upper traps, neck and rhomboids, her ROM is improving.  She is having left shoulder pain as well  due to it compensating so much, she has worries about this and needing the left shoulder to be strong so when she gets the right TSA she can have a good shoulder  Evaluation:  Patient is very frustrated, she had the right reverse TSA last July.  She did very well until around Christmas, then  she really started having increased pain and lost some ROM.  She has had blood work done, US , bone scan and CT performed.  There has been some talk of a possible infection, MD feels like she may benefit from a revision.  She would like to try PT again and the MD agreed.  She has lost ROM since I last saw her, she also is having some significant left shoulder pain from over using it.   July 2025: Patient saw surgeon, the bone scan said did not feel that there was infection or loosening however the surgeon is not so sure, he wants to do an US  to be sure, that is scheduled for the next few weeks, since she has not been to PT in a month I re-measured her ROM, she has lost at least 15 degrees of motion and more for all motions.  She is also having more pain in the left shoulder possibly from overdoing it, because she is trying to not use the right shoulder.    OBJECTIVE IMPAIRMENTS: cardiopulmonary status limiting activity, decreased activity tolerance, decreased endurance, decreased ROM, decreased strength, increased edema, increased muscle spasms, impaired flexibility, impaired UE functional use, improper body mechanics, postural dysfunction, and pain.  REHAB POTENTIAL: Good  CLINICAL DECISION MAKING: Evolving/moderate complexity  EVALUATION COMPLEXITY: Low   GOALS: Goals reviewed with patient? Yes  SHORT TERM GOALS: Target date: 01/31/24  Independent with initial HEP Goal status: met 01/31/24  LONG TERM GOALS: Target date: 04/15/24  Decrease pain 50% Goal status: progressing 03/20/24 and 03/26/24, 04/11/24 2.  Dress without difficulty Goal status: progressing 03/20/24 and 03/26/24, met 05/07/24 3.  Do hair  without difficulty Goal status: progressing 03/14/24 and 03/26/24 4.  Increase AROM right shoulder flexion to 130 degrees Goal status:  5.  Increase right shoulder ER to 60 degrees Goal status: progressing 03/14/24, 04/18/24  6.  Return to water  aerobics and or gym activity Goal status: on going 02/26/24  and 03/26/24 progressing 04/30/24  PLAN:  PT FREQUENCY: 1-2x/week  PT DURATION: 12 weeks  PLANNED INTERVENTIONS: Therapeutic exercises, Therapeutic activity, Neuromuscular re-education, Balance training, Gait training, Patient/Family education, Self Care, Joint mobilization, Dry Needling, Electrical stimulation, Cryotherapy, Vasopneumatic device, and Manual therapy  PLAN FOR NEXT SESSION: D/C as she will have a right reverse TSA revision next week  05/23/2024, 9:41 AM [place] [facility] at [facility] [place], [place], [address] Phone: [phone]   Fax:  [phone] "

## 2024-05-23 NOTE — Progress Notes (Signed)
 Rescheduled

## 2024-05-24 ENCOUNTER — Ambulatory Visit
Admission: RE | Admit: 2024-05-24 | Discharge: 2024-05-24 | Disposition: A | Source: Ambulatory Visit | Attending: Radiation Oncology | Admitting: Radiation Oncology

## 2024-05-24 ENCOUNTER — Encounter: Payer: Self-pay | Admitting: Radiation Oncology

## 2024-05-24 ENCOUNTER — Ambulatory Visit
Admission: RE | Admit: 2024-05-24 | Discharge: 2024-05-24 | Disposition: A | Source: Ambulatory Visit | Attending: Radiation Oncology

## 2024-05-24 ENCOUNTER — Ambulatory Visit
Admission: RE | Admit: 2024-05-24 | Discharge: 2024-05-24 | Disposition: A | Discharge status: Home / Self Care | Source: Ambulatory Visit | Attending: Radiation Oncology | Admitting: Radiation Oncology

## 2024-05-24 ENCOUNTER — Ambulatory Visit

## 2024-05-24 VITALS — BP 152/75 | HR 80 | Temp 97.3°F | Resp 18 | Ht 61.0 in | Wt 188.6 lb

## 2024-05-24 DIAGNOSIS — E119 Type 2 diabetes mellitus without complications: Secondary | ICD-10-CM | POA: Insufficient documentation

## 2024-05-24 DIAGNOSIS — Z79899 Other long term (current) drug therapy: Secondary | ICD-10-CM | POA: Diagnosis not present

## 2024-05-24 DIAGNOSIS — M19012 Primary osteoarthritis, left shoulder: Secondary | ICD-10-CM | POA: Insufficient documentation

## 2024-05-24 DIAGNOSIS — Z87891 Personal history of nicotine dependence: Secondary | ICD-10-CM | POA: Diagnosis not present

## 2024-05-24 DIAGNOSIS — M19041 Primary osteoarthritis, right hand: Secondary | ICD-10-CM

## 2024-05-24 DIAGNOSIS — Z809 Family history of malignant neoplasm, unspecified: Secondary | ICD-10-CM | POA: Insufficient documentation

## 2024-05-24 DIAGNOSIS — I10 Essential (primary) hypertension: Secondary | ICD-10-CM | POA: Diagnosis not present

## 2024-05-24 DIAGNOSIS — Z791 Long term (current) use of non-steroidal anti-inflammatories (NSAID): Secondary | ICD-10-CM | POA: Insufficient documentation

## 2024-05-24 DIAGNOSIS — Z7982 Long term (current) use of aspirin: Secondary | ICD-10-CM | POA: Insufficient documentation

## 2024-05-24 DIAGNOSIS — M19011 Primary osteoarthritis, right shoulder: Secondary | ICD-10-CM | POA: Insufficient documentation

## 2024-05-24 DIAGNOSIS — Z7985 Long-term (current) use of injectable non-insulin antidiabetic drugs: Secondary | ICD-10-CM | POA: Insufficient documentation

## 2024-05-24 DIAGNOSIS — K259 Gastric ulcer, unspecified as acute or chronic, without hemorrhage or perforation: Secondary | ICD-10-CM | POA: Diagnosis not present

## 2024-05-24 DIAGNOSIS — E785 Hyperlipidemia, unspecified: Secondary | ICD-10-CM | POA: Insufficient documentation

## 2024-05-24 DIAGNOSIS — I700 Atherosclerosis of aorta: Secondary | ICD-10-CM | POA: Insufficient documentation

## 2024-05-24 DIAGNOSIS — M19042 Primary osteoarthritis, left hand: Secondary | ICD-10-CM | POA: Diagnosis present

## 2024-05-24 DIAGNOSIS — K219 Gastro-esophageal reflux disease without esophagitis: Secondary | ICD-10-CM | POA: Insufficient documentation

## 2024-05-24 NOTE — Progress Notes (Signed)
 " Radiation Oncology         [phone] ________________________________  Name: Veronica Galvan        MRN: [id number]  Date of Service: 05/24/2024 DOB: 1955-06-08  RR:Azrx, [name] NOVAK, NP  Almarie [name] NOVAK, NP     REFERRING PHYSICIAN: Almarie [name] NOVAK, NP   DIAGNOSIS: The encounter diagnosis was Osteoarthritis of both hands, unspecified osteoarthritis type. @icd10 @   HISTORY OF PRESENT ILLNESS: Veronica Galvan is a 69 y.o. female seen in consultation for radiation therapy.  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 Her shoulder arthritis began after a traumatic injury. She sustained a fall on her deck after tripping on a raised board, resulting in a severe fracture of the R proximal humerus. She underwent a R reverse total shoulder arthroplasty on 11/10/22. Postoperatively, she participated in physical therapy but has had a recurrence in symptoms. She is undergoing a revision on the R next week. Recently she has also developed L shoulder pain and stiffness.  She also reports a decade of bilateral hand pain for which she presents today. She reports it runs in her family and her mother had severe bilateral hand arthritis as well. She reports bony deformities in her hands. She talked to her physical therapist and her rheumatologist and it was recommended she consider low dose radiation.  She has used voltaren gel and celebrex  without adequate relief. She cannot tolerate NSAIDs due to a gastric ulcer. She is very active and enjoys spending time and helping with her grandchildren and these activities are limited due to pain and stiffness. She has lots of tricks and tools she has learned from PT and OT to help perform her regular activities but would like to be able to perform her regular activities without these tools.  She lives alone, has no recent falls, no weight-bearing restrictions and no red flag symptoms. Prior to her sx she was regularly  participating in exercise classes and activities with her grandchildren. Her goals include dressing and grooming without difficulty.   PREVIOUS RADIATION THERAPY: No  AUTOIMMUNE DISEASE: No  MEDICAL DEVICES: No  PREGNANCY: No   PAST MEDICAL HISTORY:  Past Medical History:  Diagnosis Date   Allergy     Anxiety    Aortic atherosclerosis    Arthritis    hands   Cataract    Diabetes mellitus without complication (HCC)    type 2   Family history of adverse reaction to anesthesia    son has malignant hyperthermia, daughter does not daughter recently had c section without problems   GERD (gastroesophageal reflux disease)    Headache    sinus   Hyperlipidemia    Hypertension    Pneumonia    PONV (postoperative nausea and vomiting)    nausea only   Ulcer, stomach peptic yrs ago       PAST SURGICAL HISTORY: Past Surgical History:  Procedure Laterality Date   APPENDECTOMY     both hells bone spur repair     both heels with metal clips   both shoulder rotator cuff repair     CESAREAN SECTION     x 1   COLONOSCOPY WITH PROPOFOL  N/A 09/07/2016   Procedure: COLONOSCOPY WITH PROPOFOL ;  Surgeon: Vicci Gladis POUR, MD;  Location: [facility];  Service: Endoscopy;  Laterality: N/A;   colonscopy  06/2011   polyps   ELBOW FRACTURE SURGERY Left    EYE SURGERY     FRACTURE SURGERY  REVERSE SHOULDER ARTHROPLASTY Right 11/10/2022   Procedure: REVERSE SHOULDER ARTHROPLASTY;  Surgeon: Melita Drivers, MD;  Location: [facility];  Service: Orthopedics;  Laterality: Right;  Please follow in room 6 if able   TUBAL LIGATION     VESICO-VAGINAL FISTULA REPAIR       FAMILY HISTORY:  Family History  Problem Relation Age of Onset   Hypertension Mother    COPD Mother    Cancer Mother    Arthritis Mother    Hypertension Father    Hyperlipidemia Father    Diabetes Father    Cancer Father    Alcohol abuse Father    Diabetes Brother    Alcohol abuse Brother    Hypertension Brother     Arthritis Maternal Grandmother    Birth defects Maternal Grandmother    Diabetes Paternal Grandmother    Heart disease Paternal Grandfather    Diabetes Paternal Aunt    Diabetes Paternal Aunt    Obesity Son      SOCIAL HISTORY:  reports that she has quit smoking. Her smoking use included cigarettes. She has a 14 pack-year smoking history. She has never used smokeless tobacco. She reports current alcohol use. She reports that she does not use drugs.   ALLERGIES: Codeine, Benzonatate, and Epinephrine   MEDICATIONS:  Current Outpatient Medications  Medication Sig Dispense Refill   acetaminophen  (TYLENOL ) 650 MG CR tablet Take 650 mg by mouth every 8 (eight) hours as needed for pain.     ALPRAZolam  (XANAX ) 0.5 MG tablet Take 1 tablet (0.5 mg total) by mouth at bedtime as needed for anxiety. (Patient not taking: Reported on 05/13/2024) 30 tablet 0   Ascorbic Acid (VITAMIN C) 1000 MG tablet Take 1,000 mg by mouth every morning.     aspirin EC 81 MG tablet Take 81 mg by mouth every morning.     atorvastatin  (LIPITOR) 40 MG tablet Take 1 tablet (40 mg total) by mouth every evening. 90 tablet 3   Blood Glucose Monitoring Suppl DEVI 1 each by Does not apply route in the morning, at noon, and at bedtime. May substitute to any manufacturer covered by patient's insurance. 1 each 0   Calcium  Carb-Cholecalciferol (CALCIUM  600+D3 PO) Take 1 tablet by mouth every morning.     calcium  carbonate (TUMS - DOSED IN MG ELEMENTAL CALCIUM ) 500 MG chewable tablet Chew 2 tablets by mouth daily as needed for indigestion or heartburn.     carboxymethylcellulose (REFRESH TEARS) 0.5 % SOLN Place 2 drops into both eyes 2 (two) times daily.     celecoxib  (CELEBREX ) 200 MG capsule TAKE 1 CAPSULE BY MOUTH DAILY 90 capsule 3   Cholecalciferol (VITAMIN D3 PO) Take 50 mcg by mouth daily.     Coenzyme Q10 300 MG CAPS Take 1 capsule by mouth every morning.     diclofenac Sodium (VOLTAREN) 1 % GEL Apply 1 Application  topically 2 (two) times daily. (Patient not taking: Reported on 05/13/2024)     fluticasone  (FLONASE ) 50 MCG/ACT nasal spray Place 2 sprays into both nostrils daily. 16 g 6   glucose blood test strip Use as instructed 100 each 12   levocetirizine (XYZAL ) 5 MG tablet Take 1 tablet (5 mg total) by mouth every evening. 90 tablet 3   lisinopril  (ZESTRIL ) 5 MG tablet Take 1 tablet (5 mg total) by mouth 2 (two) times daily. 180 tablet 3   LUTEIN-ZEAXANTHIN PO Take 1 tablet by mouth every morning.     MAGNESIUM GLYCINATE PO Take  2 capsules by mouth daily. 240mg      Multiple Vitamins-Minerals (MULTIVITAMIN WITH MINERALS) tablet Take 1 tablet by mouth every morning.     Omega-3 Fatty Acids (FISH OIL) 1000 MG CAPS Take 2,000 mg by mouth daily.     omeprazole  (PRILOSEC) 20 MG capsule Take 1 capsule (20 mg total) by mouth every morning. 90 capsule 3   Probiotic Product (PROBIOTIC PO) Take 2 each by mouth daily. gummies     RESTASIS 0.05 % ophthalmic emulsion Place 1 drop into both eyes 2 (two) times daily.     Semaglutide , 2 MG/DOSE, 8 MG/3ML SOPN Inject 2 mg as directed once a week. 9 mL 3   sodium chloride  (MURO 128) 2 % ophthalmic solution Place 2 drops into both eyes 2 (two) times daily.     SYNJARDY  XR 12.08-998 MG TB24 Take 1 tablet by mouth 2 (two) times daily. 180 tablet 3   TURMERIC PO Take 2,000 mg by mouth daily.     UNABLE TO FIND Take 1 tablet by mouth daily. Cinsulin supplement     vitamin E 180 MG (400 UNITS) capsule Take 400 Units by mouth daily.     No current facility-administered medications for this encounter.     REVIEW OF SYSTEMS: The patient reports that she is doing well overall and a review of symptoms is otherwise negative.      PHYSICAL EXAM:  Wt Readings from Last 3 Encounters:  05/24/24 188 lb 9.6 oz (85.5 kg)  05/17/24 187 lb (84.8 kg)  05/15/24 193 lb (87.5 kg)   Temp Readings from Last 3 Encounters:  05/24/24 (!) 97.3 F (36.3 C)  05/17/24 98.2 F (36.8 C)  (Oral)  04/17/24 97.8 F (36.6 C) (Oral)   BP Readings from Last 3 Encounters:  05/24/24 (!) 152/75  05/17/24 (!) 159/67  05/15/24 134/74   Pulse Readings from Last 3 Encounters:  05/24/24 80  05/17/24 93  05/15/24 80   Pain Assessment Pain Score: 5 /10   Physical Exam Vitals and nursing note reviewed.  Constitutional:      General: She is not in acute distress. HENT:     Head: Normocephalic.  Eyes:     Extraocular Movements: Extraocular movements intact.  Cardiovascular:     Rate and Rhythm: Normal rate.  Pulmonary:     Effort: Pulmonary effort is normal. No respiratory distress.  Abdominal:     General: There is no distension.  Musculoskeletal:        General: Swelling and deformity present.     Cervical back: Normal range of motion.     Comments: Significant swelling and bony hypertrophy of bilateral PIP and DIP joints, most prominently affecting the 2nd and 3rd digits bilaterally   Skin:    Coloration: Skin is not jaundiced or pale.  Neurological:     General: No focal deficit present.     Mental Status: She is alert.  Psychiatric:        Mood and Affect: Mood normal.       LABORATORY DATA:  Lab Results  Component Value Date   WBC 10.7 (H) 05/17/2024   HGB 13.7 05/17/2024   HCT 44.0 05/17/2024   MCV 85.9 05/17/2024   PLT 363 05/17/2024   Lab Results  Component Value Date   NA 141 05/17/2024   K 5.3 (H) 05/17/2024   CL 104 05/17/2024   CO2 24 05/17/2024   Lab Results  Component Value Date   ALT 35 04/17/2024  AST 27 04/17/2024   ALKPHOS 52 04/17/2024   BILITOT 0.5 04/17/2024      RADIOGRAPHY: No results found.   PATHOLOGY: no pertinent pathology     IMPRESSION/PLAN:   Ms. Losee is a 69 yo F w/ a hx of moderate severe multijoint arthritis, most prominantly affecting her shoulders and hands. After a detailed discussion of the patients history, prior treatment response, and current goals, we reviewed the proposed protocol for LDRT  targeting the bilateral hands. Our institutional approach follows a regimen of 3 Gy total, delivered in 6 fractions of 0.5 Gy per fraction over 2-3 weeks. This dosing is consistent with published guidelines and peer-reviewed data for non-malignant musculoskeletal conditions.  We reviewed:   Goals of care: symptom relief and improved mobility Expected timeline for therapeutic benefit: typically several weeks to months Potential risks, including rare but theoretical concerns regarding late tissue effects or secondary malignancy (risk estimated to be extremely low at this dose and age) Lack of immunosuppression and inactive autoimmune history, minimizing concern for immune-mediated complications   My goal with low dose radiation therapy is to help reduce chronic joint pain and improve mobility, particularly for activities like dressing, grooming and taking care of her grandchildren. This treatment does not reverse joint damage, but it can calm the inflammation in and around the joint that contributes to pain. Based on published data, about 60-80% of patients with osteoarthritis experience meaningful pain relief within a few weeks to months after completing a short course of low-dose radiation. While not everyone responds, the majority of patients do report improvement, and the treatment is generally well tolerated.  The patient was agreeable to proceed. We will arrange CT simulation for planning and initiate treatment pending final review and setup. A follow-up visit will be scheduled approximately 8-12 weeks after completion of LDRT to assess clinical response.   We personally spent 60 minutes in this encounter including chart review, reviewing radiological studies, meeting face-to-face with the patient, entering orders and completing documentation.    Estefana HERO. Maritza, M.D.   "

## 2024-05-24 NOTE — Progress Notes (Signed)
 Site of osteoarthritis: hands   How long have you had pain? 10 years   What over the counter or prescription medications have you tried?  Voltaren, Celebrex , fish oil and other medications.   Does anything make the pain better or worse? Daily  activities worsens pain.       Ambulatory status? Walker? Wheelchair?: Ambulatory  SAFETY ISSUES: Prior radiation? no Pacemaker/ICD? no Possible current pregnancy? no Is the patient on methotrexate? no  Current Complaints / other details:    BP (!) 152/75 (BP Location: Left Arm, Patient Position: Sitting, Cuff Size: Large)   Pulse 80   Temp (!) 97.3 F (36.3 C)   Resp 18   Ht 5' 1 (1.549 m)   Wt 188 lb 9.6 oz (85.5 kg)   SpO2 97%   BMI 35.64 kg/m

## 2024-05-24 NOTE — Progress Notes (Incomplete)
 " Radiation Oncology         [phone]) [phone] ________________________________  Name: Veronica Galvan        MRN: [id number]  Date of Service: 05/24/2024 DOB: 1955-08-10  RR:Azrx, [name] NOVAK, NP  Almarie [name] NOVAK, NP     REFERRING PHYSICIAN: Almarie [name] NOVAK, NP   DIAGNOSIS: There were no encounter diagnoses. @icd10 @   HISTORY OF PRESENT ILLNESS: Veronica Galvan is a 69 y.o. female seen in consultation for radiation therapy.  She is a very active woman with chronic, progressive bilateral shoulder pain, R greater than L, following a traumatic injury and subsequent surgical intervention. She sustained a fall on her deck after tripping on a raised board, resulting in a severe fracture of the R proximal humerus. She underwent a R reverse total shoulder arthroplasty on 11/10/22.   Postoperatively, she participated in physical therapy for multiple visits with initially good progress. However, during a particularly busy and physically demanding period between Thanksgiving and New Year's, she developed worsening R shoulder pain w/ associated regression in range of motion. Symptoms further escalated with notable decline in active range of motion by March. She now reports persistent pain affecting basic activities of daily living.   Extensive evaluation has been performed including x-rays, bone scan and CT imaging. She was evaluated by her surgeon following imaging and while infection and prosthetic loosening were considered, these were felt to be unlikely. The patient is frustrated due to ongoing pain.   As of 11/30/23 she endorsed constant R shoulder pain rated 7/10 at baseline, described as a dull ache at rest with sharp exacerbations during movement, reaching up to 10/10 with quick or overhead motions. She also reports L shoulder pain with motion up to 8/10. Pain is aggravated by quick or repetitive movements and partially relieved with ice, rest and acetaminophen , with best pain control to approximately 3/10.    She continues to engage in PT and has undergone steroid injections. Despite conservative measures and ongoing therapy, symptoms persist.   She lives alone, has no recent falls, no weight-bearing restrictions and no red flag symptoms. Prior to injury she was fully independent and very active, regularly participating in exercise classes and activities with her grandchildren. Her goals include dressing and grooming without difficulty.   PREVIOUS RADIATION THERAPY: {EXAM; YES/NO:19492::No}  AUTOIMMUNE DISEASE: {EXAM; YES/NO:19492::No}  MEDICAL DEVICES: {EXAM; YES/NO:19492::No}  PREGNANCY: {EXAM; YES/NO:19492::No}   PAST MEDICAL HISTORY:  Past Medical History:  Diagnosis Date   Allergy     Anxiety    Aortic atherosclerosis    Arthritis    hands   Cataract    Diabetes mellitus without complication (HCC)    type 2   Family history of adverse reaction to anesthesia    son has malignant hyperthermia, daughter does not daughter recently had c section without problems   GERD (gastroesophageal reflux disease)    Headache    sinus   Hyperlipidemia    Hypertension    Pneumonia    PONV (postoperative nausea and vomiting)    nausea only   Ulcer, stomach peptic yrs ago       PAST SURGICAL HISTORY: Past Surgical History:  Procedure Laterality Date   APPENDECTOMY     both hells bone spur repair     both heels with metal clips   both shoulder rotator cuff repair     CESAREAN SECTION     x 1   COLONOSCOPY WITH PROPOFOL  N/A 09/07/2016   Procedure: COLONOSCOPY WITH PROPOFOL ;  Surgeon:  Vicci Gladis POUR, MD;  Location: [facility];  Service: Endoscopy;  Laterality: N/A;   colonscopy  06/2011   polyps   ELBOW FRACTURE SURGERY Left    EYE SURGERY     FRACTURE SURGERY     REVERSE SHOULDER ARTHROPLASTY Right 11/10/2022   Procedure: REVERSE SHOULDER ARTHROPLASTY;  Surgeon: Melita Drivers, MD;  Location: [facility];  Service: Orthopedics;  Laterality: Right;  Please follow in  room 6 if able   TUBAL LIGATION     VESICO-VAGINAL FISTULA REPAIR       FAMILY HISTORY:  Family History  Problem Relation Age of Onset   Hypertension Mother    COPD Mother    Cancer Mother    Arthritis Mother    Hypertension Father    Hyperlipidemia Father    Diabetes Father    Cancer Father    Alcohol abuse Father    Diabetes Brother    Alcohol abuse Brother    Hypertension Brother    Arthritis Maternal Grandmother    Birth defects Maternal Grandmother    Diabetes Paternal Grandmother    Heart disease Paternal Grandfather    Diabetes Paternal Aunt    Diabetes Paternal Aunt    Obesity Son      SOCIAL HISTORY:  reports that she has quit smoking. Her smoking use included cigarettes. She has a 14 pack-year smoking history. She has never used smokeless tobacco. She reports current alcohol use. She reports that she does not use drugs.   ALLERGIES: Codeine, Benzonatate, and Epinephrine   MEDICATIONS:  Current Outpatient Medications  Medication Sig Dispense Refill   acetaminophen  (TYLENOL ) 650 MG CR tablet Take 650 mg by mouth every 8 (eight) hours as needed for pain.     ALPRAZolam  (XANAX ) 0.5 MG tablet Take 1 tablet (0.5 mg total) by mouth at bedtime as needed for anxiety. (Patient not taking: Reported on 05/13/2024) 30 tablet 0   Ascorbic Acid (VITAMIN C) 1000 MG tablet Take 1,000 mg by mouth every morning.     aspirin EC 81 MG tablet Take 81 mg by mouth every morning.     atorvastatin  (LIPITOR) 40 MG tablet Take 1 tablet (40 mg total) by mouth every evening. 90 tablet 3   Blood Glucose Monitoring Suppl DEVI 1 each by Does not apply route in the morning, at noon, and at bedtime. May substitute to any manufacturer covered by patient's insurance. 1 each 0   Calcium  Carb-Cholecalciferol (CALCIUM  600+D3 PO) Take 1 tablet by mouth every morning.     calcium  carbonate (TUMS - DOSED IN MG ELEMENTAL CALCIUM ) 500 MG chewable tablet Chew 2 tablets by mouth daily as needed for  indigestion or heartburn.     carboxymethylcellulose (REFRESH TEARS) 0.5 % SOLN Place 2 drops into both eyes 2 (two) times daily.     celecoxib  (CELEBREX ) 200 MG capsule TAKE 1 CAPSULE BY MOUTH DAILY 90 capsule 3   Cholecalciferol (VITAMIN D3 PO) Take 50 mcg by mouth daily.     Coenzyme Q10 300 MG CAPS Take 1 capsule by mouth every morning.     diclofenac Sodium (VOLTAREN) 1 % GEL Apply 1 Application topically 2 (two) times daily. (Patient not taking: Reported on 05/13/2024)     fluticasone  (FLONASE ) 50 MCG/ACT nasal spray Place 2 sprays into both nostrils daily. 16 g 6   glucose blood test strip Use as instructed 100 each 12   levocetirizine (XYZAL ) 5 MG tablet Take 1 tablet (5 mg total) by mouth every evening. 90  tablet 3   lisinopril  (ZESTRIL ) 5 MG tablet Take 1 tablet (5 mg total) by mouth 2 (two) times daily. 180 tablet 3   LUTEIN-ZEAXANTHIN PO Take 1 tablet by mouth every morning.     MAGNESIUM GLYCINATE PO Take 2 capsules by mouth daily. 240mg      Multiple Vitamins-Minerals (MULTIVITAMIN WITH MINERALS) tablet Take 1 tablet by mouth every morning.     Omega-3 Fatty Acids (FISH OIL) 1000 MG CAPS Take 2,000 mg by mouth daily.     omeprazole  (PRILOSEC) 20 MG capsule Take 1 capsule (20 mg total) by mouth every morning. 90 capsule 3   Probiotic Product (PROBIOTIC PO) Take 2 each by mouth daily. gummies     RESTASIS 0.05 % ophthalmic emulsion Place 1 drop into both eyes 2 (two) times daily.     Semaglutide , 2 MG/DOSE, 8 MG/3ML SOPN Inject 2 mg as directed once a week. 9 mL 3   sodium chloride  (MURO 128) 2 % ophthalmic solution Place 2 drops into both eyes 2 (two) times daily.     SYNJARDY  XR 12.08-998 MG TB24 Take 1 tablet by mouth 2 (two) times daily. 180 tablet 3   TURMERIC PO Take 2,000 mg by mouth daily.     UNABLE TO FIND Take 1 tablet by mouth daily. Cinsulin supplement     vitamin E 180 MG (400 UNITS) capsule Take 400 Units by mouth daily.     No current facility-administered  medications for this visit.     REVIEW OF SYSTEMS: The patient reports that s***/he is doing well overall and a review of symptoms is otherwise negative.      PHYSICAL EXAM:  Wt Readings from Last 3 Encounters:  05/17/24 187 lb (84.8 kg)  05/15/24 193 lb (87.5 kg)  04/17/24 194 lb 9.6 oz (88.3 kg)   Temp Readings from Last 3 Encounters:  05/17/24 98.2 F (36.8 C) (Oral)  04/17/24 97.8 F (36.6 C) (Oral)  04/08/24 98 F (36.7 C) (Oral)   BP Readings from Last 3 Encounters:  05/17/24 (!) 159/67  05/15/24 134/74  04/17/24 (!) 140/59   Pulse Readings from Last 3 Encounters:  05/17/24 93  05/15/24 80  04/17/24 77    /10   Physical Exam    LABORATORY DATA:  Lab Results  Component Value Date   WBC 10.7 (H) 05/17/2024   HGB 13.7 05/17/2024   HCT 44.0 05/17/2024   MCV 85.9 05/17/2024   PLT 363 05/17/2024   Lab Results  Component Value Date   NA 141 05/17/2024   K 5.3 (H) 05/17/2024   CL 104 05/17/2024   CO2 24 05/17/2024   Lab Results  Component Value Date   ALT 35 04/17/2024   AST 27 04/17/2024   ALKPHOS 52 04/17/2024   BILITOT 0.5 04/17/2024      RADIOGRAPHY:   CT Shoulder R WO 12/01/23:  CLINICAL DATA:  Chronic right shoulder pain. History of reversed total shoulder arthroplasty 11/10/2022   EXAM: CT OF THE UPPER RIGHT EXTREMITY WITHOUT CONTRAST   TECHNIQUE: Multidetector CT imaging of the upper right extremity was performed according to the standard protocol.   RADIATION DOSE REDUCTION: This exam was performed according to the departmental dose-optimization program which includes automated exposure control, adjustment of the mA and/or kV according to patient size and/or use of iterative reconstruction technique.   COMPARISON:  Radiographs 11/05/2022   FINDINGS: The long stem humeral component appears to be well seated without complicating features. No evidence of loosening or  periprosthetic fracture.   The glenosphere is intact. No  evidence of fixation screw loosening or fracture. The glenosphere is normally located in the humeral cup. No subluxation or dislocation.   No CT findings for significant soft tissue abnormalities. Grossly by CT the shoulder musculature is unremarkable. No subacromial fluid collections.   The coracoid process is intact. The the [facility] joint is intact. Suspect prior bony decompressive surgery. The visualized right ribs are intact. The right lung is clear. No right breast mass or axillary adenopathy. Aortic and coronary artery calcifications are noted.   IMPRESSION: 1. Reversed total right shoulder arthroplasty without complicating features. 2. No CT findings for significant soft tissue abnormalities. 3. Aortic atherosclerosis.     PATHOLOGY: no pertinent pathology      IMPRESSION/PLAN:   After a detailed discussion of the patients history, prior treatment response, and current goals, we reviewed the proposed protocol for LDRT targeting the ***. Our institutional approach follows a regimen of 3 Gy total, delivered in 6 fractions of 0.5 Gy per fraction over 2-3 weeks. This dosing is consistent with published guidelines and peer-reviewed data for non-malignant musculoskeletal conditions.  We reviewed:   Goals of care: symptom relief and improved mobility Expected timeline for therapeutic benefit: typically several weeks to months Potential risks, including rare but theoretical concerns regarding late tissue effects or secondary malignancy (risk estimated to be extremely low at this dose and age) Lack of immunosuppression and inactive autoimmune history, minimizing concern for immune-mediated complications   My goal with low dose radiation therapy is to help reduce chronic joint pain and improve mobility, particularly for activities like exercising and playing with her grandchildren. This treatment does not reverse joint damage, but it can calm the inflammation in and around the joint that  contributes to pain. Based on published data, about 60-80% of patients with osteoarthritis experience meaningful pain relief within a few weeks to months after completing a short course of low-dose radiation. While not everyone responds, the majority of patients do report improvement, and the treatment is generally well tolerated.  The patient was agreeable to proceed. We will arrange CT simulation for planning and initiate treatment pending final review and setup. A follow-up visit will be scheduled approximately 8-12 weeks after completion of LDRT to assess clinical response.   We personally spent 60 minutes in this encounter including chart review, reviewing radiological studies, meeting face-to-face with the patient, entering orders and completing documentation.    Estefana HERO. Maritza, M.D.   "

## 2024-05-27 ENCOUNTER — Ambulatory Visit: Admitting: Occupational Therapy

## 2024-05-28 ENCOUNTER — Ambulatory Visit: Admitting: Physical Therapy

## 2024-05-28 NOTE — H&P (Signed)
 "   PREOPERATIVE H&P  Chief Complaint: Complications due to internal orthopedic device, implant  HPI: Veronica Galvan is a 69 y.o. female who presents for preoperative history and physical prior to scheduled surgery, Procedures: REVISION, REVERSE TOTAL ARTHROPLASTY, SHOULDER.   Veronica Galvan is a 69 year old female who presents for a second opinion regarding her right shoulder. She previously underwent a reverse total shoulder arthroplasty for a fracture, performed remotely. There is a concern for infection or loosening of the prosthesis. The patient reports that physical therapy has been somewhat beneficial but has not resolved her symptoms.   Symptoms are rated as moderate to severe, and have been worsening.  This is significantly impairing activities of daily living.    Please see clinic note for further details on this patient's care.    She has elected for surgical management.   Past Medical History:  Diagnosis Date   Allergy     Anxiety    Aortic atherosclerosis    Arthritis    hands   Cataract    Diabetes mellitus without complication (HCC)    type 2   Family history of adverse reaction to anesthesia    son has malignant hyperthermia, daughter does not daughter recently had c section without problems   GERD (gastroesophageal reflux disease)    Headache    sinus   Hyperlipidemia    Hypertension    Pneumonia    PONV (postoperative nausea and vomiting)    nausea only   Ulcer, stomach peptic yrs ago   Past Surgical History:  Procedure Laterality Date   APPENDECTOMY     both hells bone spur repair     both heels with metal clips   both shoulder rotator cuff repair     CESAREAN SECTION     x 1   COLONOSCOPY WITH PROPOFOL  N/A 09/07/2016   Procedure: COLONOSCOPY WITH PROPOFOL ;  Surgeon: Vicci Gladis POUR, MD;  Location: [facility];  Service: Endoscopy;  Laterality: N/A;   colonscopy  06/2011   polyps   ELBOW FRACTURE SURGERY Left    EYE SURGERY     FRACTURE  SURGERY     REVERSE SHOULDER ARTHROPLASTY Right 11/10/2022   Procedure: REVERSE SHOULDER ARTHROPLASTY;  Surgeon: Melita Drivers, MD;  Location: [facility];  Service: Orthopedics;  Laterality: Right;  Please follow in room 6 if able   TUBAL LIGATION     VESICO-VAGINAL FISTULA REPAIR     Social History   Socioeconomic History   Marital status: Widowed    Spouse name: Not on file   Number of children: 2   Years of education: Not on file   Highest education level: Bachelor's degree (e.g., BA, AB, BS)  Occupational History   Not on file  Tobacco Use   Smoking status: Former    Current packs/day: 1.00    Average packs/day: 1 pack/day for 14.0 years (14.0 ttl pk-yrs)    Types: Cigarettes   Smokeless tobacco: Never   Tobacco comments:    quit 31 yrs ago  Vaping Use   Vaping status: Never Used  Substance and Sexual Activity   Alcohol use: Yes    Comment: rare   Drug use: No   Sexual activity: Not Currently    Birth control/protection: Post-menopausal  Other Topics Concern   Not on file  Social History Narrative   Right handed   Caffeine intake none   Social Drivers of Health   Tobacco Use: Medium Risk (05/23/2024)   Patient History  Smoking Tobacco Use: Former    Smokeless Tobacco Use: Never    Passive Exposure: Not on file  Financial Resource Strain: Low Risk (04/08/2024)   Overall Financial Resource Strain (CARDIA)    Difficulty of Paying Living Expenses: Not hard at all  Food Insecurity: No Food Insecurity (04/08/2024)   Epic    Worried About Programme Researcher, Broadcasting/film/video in the Last Year: Never true    Ran Out of Food in the Last Year: Never true  Transportation Needs: No Transportation Needs (04/08/2024)   Epic    Lack of Transportation (Medical): No    Lack of Transportation (Non-Medical): No  Physical Activity: Sufficiently Active (04/08/2024)   Exercise Vital Sign    Days of Exercise per Week: 5 days    Minutes of Exercise per Session: 40 min  Stress: No Stress  Concern Present (04/08/2024)   [employer] of Occupational Health - Occupational Stress Questionnaire    Feeling of Stress: Only a little  Social Connections: Moderately Integrated (04/08/2024)   Social Connection and Isolation Panel    Frequency of Communication with Friends and Family: More than three times a week    Frequency of Social Gatherings with Friends and Family: More than three times a week    Attends Religious Services: More than 4 times per year    Active Member of [employer] or Organizations: Yes    Attends Banker Meetings: More than 4 times per year    Marital Status: Widowed  Depression (PHQ2-9): Low Risk (04/08/2024)   Depression (PHQ2-9)    PHQ-2 Score: 0  Alcohol Screen: Low Risk (04/08/2024)   Alcohol Screen    Last Alcohol Screening Score (AUDIT): 1  Housing: Unknown (04/08/2024)   Epic    Unable to Pay for Housing in the Last Year: No    Number of Times Moved in the Last Year: Not on file    Homeless in the Last Year: No  Utilities: Not At Risk (09/13/2023)   [employer] Utilities    Threatened with loss of utilities: No  Health Literacy: Not on file   Family History  Problem Relation Age of Onset   Hypertension Mother    COPD Mother    Cancer Mother    Arthritis Mother    Hypertension Father    Hyperlipidemia Father    Diabetes Father    Cancer Father    Alcohol abuse Father    Diabetes Brother    Alcohol abuse Brother    Hypertension Brother    Arthritis Maternal Grandmother    Birth defects Maternal Grandmother    Diabetes Paternal Grandmother    Heart disease Paternal Grandfather    Diabetes Paternal Aunt    Diabetes Paternal Aunt    Obesity Son    Allergies[1] Prior to Admission medications  Medication Sig Start Date End Date Taking? Authorizing Provider  acetaminophen  (TYLENOL ) 650 MG CR tablet Take 650 mg by mouth every 8 (eight) hours as needed for pain.   Yes [provider]  Ascorbic Acid (VITAMIN C) 1000 MG tablet Take  1,000 mg by mouth every morning.   Yes [provider]  aspirin  EC 81 MG tablet Take 81 mg by mouth every morning.   Yes [provider]  atorvastatin  (LIPITOR) 40 MG tablet Take 1 tablet (40 mg total) by mouth every evening. 12/19/23  Yes [name] Leita Repine, [name]  Calcium  Carb-Cholecalciferol (CALCIUM  600+D3 PO) Take 1 tablet by mouth every morning.   Yes [provider]  calcium  carbonate (TUMS - DOSED IN MG ELEMENTAL CALCIUM ) 500 MG chewable tablet Chew 2 tablets by mouth daily as needed for indigestion or heartburn.   Yes [provider]  carboxymethylcellulose (REFRESH TEARS) 0.5 % SOLN Place 2 drops into both eyes 2 (two) times daily.   Yes [provider]  celecoxib  (CELEBREX ) 200 MG capsule TAKE 1 CAPSULE BY MOUTH DAILY 12/19/23  Yes [name] Leita Repine, [name]  Cholecalciferol (VITAMIN D3 PO) Take 50 mcg by mouth daily.   Yes [provider]  Coenzyme Q10 300 MG CAPS Take 1 capsule by mouth every morning.   Yes [provider]  fluticasone  (FLONASE ) 50 MCG/ACT nasal spray Place 2 sprays into both nostrils daily. 04/17/24  Yes Almarie [name] NOVAK, NP  levocetirizine (XYZAL ) 5 MG tablet Take 1 tablet (5 mg total) by mouth every evening. 12/19/23  Yes [name] Leita Repine, [name]  lisinopril  (ZESTRIL ) 5 MG tablet Take 1 tablet (5 mg total) by mouth 2 (two) times daily. 12/19/23  Yes [name] Leita Repine, [name]  LUTEIN-ZEAXANTHIN PO Take 1 tablet by mouth every morning.   Yes [provider]  MAGNESIUM GLYCINATE PO Take 2 capsules by mouth daily. 240mg    Yes [provider]  Multiple Vitamins-Minerals (MULTIVITAMIN WITH MINERALS) tablet Take 1 tablet by mouth every morning.   Yes [provider]  Omega-3 Fatty Acids (FISH OIL) 1000 MG CAPS Take 2,000 mg by mouth daily.   Yes [provider]  omeprazole  (PRILOSEC) 20 MG capsule Take 1 capsule (20 mg total) by mouth every morning. 12/19/23  Yes [name] Leita Repine, [name]  Probiotic Product (PROBIOTIC PO) Take 2 each by mouth daily. gummies   Yes [provider]  RESTASIS 0.05 % ophthalmic emulsion Place 1 drop into both eyes 2 (two) times daily. 08/09/23  Yes [provider]  Semaglutide , 2 MG/DOSE, 8 MG/3ML SOPN Inject 2 mg as directed once a week. 05/06/24  Yes Almarie [name] NOVAK, NP  sodium chloride  (MURO 128) 2 % ophthalmic solution Place 2 drops into both eyes 2 (two) times daily.   Yes [provider]  SYNJARDY  XR 12.08-998 MG TB24 Take 1 tablet by mouth 2 (two) times daily. 05/06/24  Yes Almarie [name] B, NP  TURMERIC PO Take 2,000 mg by mouth daily.   Yes [provider]  UNABLE TO FIND Take 1 tablet by mouth daily. Cinsulin supplement   Yes [provider]  vitamin E 180 MG (400 UNITS) capsule Take 400 Units by mouth daily.   Yes [provider]  ALPRAZolam  (XANAX ) 0.5 MG tablet Take 1 tablet (0.5 mg total) by mouth at bedtime as needed for anxiety. Patient not taking: Reported on 05/13/2024 08/16/23   Douglass Caul B, [name]  Blood Glucose Monitoring Suppl DEVI 1 each by Does not apply route in the morning, at noon, and at bedtime. May substitute to any manufacturer covered by patient's insurance. 08/01/23   [name] Leita Repine, [name]  diclofenac Sodium (VOLTAREN) 1 % GEL Apply 1 Application topically 2 (two) times daily. Patient not taking: Reported on 05/13/2024    [provider]  glucose blood test strip Use as instructed 06/20/23   [name] Leita Repine, [name]    ROS: All other systems have been reviewed and were otherwise negative with the exception of those mentioned in the HPI and as above.  Physical Exam: General: Alert, no acute distress Cardiovascular: No pedal edema Respiratory: No cyanosis, no use of accessory musculature GI: No organomegaly, abdomen  is soft and non-tender Skin: No lesions in the area of chief complaint Neurologic: Sensation intact distally Psychiatric: Patient  is competent for consent with normal mood and affect Lymphatic: No axillary or cervical lymphadenopathy  MUSCULOSKELETAL:  Active forward elevation to 140 degrees, external rotation to 0 degrees, internal rotation to back pocket.   Imaging: See results  BMI: Estimated body mass index is 35.64 kg/m as calculated from the following:   Height as of 05/24/24: 5' 1 (1.549 m).   Weight as of 05/24/24: 85.5 kg.  Lab Results  Component Value Date   ALBUMIN 4.3 04/17/2024   Diabetes:   Patient has a diagnosis of diabetes,  Lab Results  Component Value Date   HGBA1C 7.1 (H) 04/17/2024   Smoking Status:      Assessment: Complications due to internal orthopedic device, implant  Plan: Plan for Procedures: REVISION, REVERSE TOTAL ARTHROPLASTY, SHOULDER  The risks benefits and alternatives were discussed with the patient including but not limited to the risks of nonoperative treatment, versus surgical intervention including infection, bleeding, nerve injury,  blood clots, cardiopulmonary complications, morbidity, mortality, among others, and they were willing to proceed.   We additionally specifically discussed risks of axillary nerve injury, infection, periprosthetic fracture, continued pain and longevity of implants prior to beginning procedure.    Patient will be admitted for inpatient treatment for surgery, pain control, OT, prophylactic antibiotics, VTE prophylaxis, and discharge planning. The patient is planning to be discharged home with outpatient PT.   The patient acknowledged the explanation, agreed to proceed with the plan and consent was signed.   Operative Plan: R revision reverse total shoulder arthroplasty Discharge Medications: standard DVT Prophylaxis: aspirin  Physical Therapy: outpatient - likely delayed Special Discharge needs: Sling. IceMan   Aleck LOISE Stalling, PA-C  05/28/2024 8:24 AM     [1]  Allergies Allergen Reactions   Codeine Hives and Itching    Benzonatate Rash   Epinephrine Palpitations   "

## 2024-05-28 NOTE — Anesthesia Preprocedure Evaluation (Signed)
 "                                  Anesthesia Evaluation  Patient identified by MRN, date of birth, ID band Patient awake    Reviewed: Allergy  & Precautions, H&P , NPO status , Patient's Chart, lab work & pertinent test results  History of Anesthesia Complications (+) PONV, MALIGNANT HYPERTHERMIA, Family history of anesthesia reaction and history of anesthetic complications  Airway Mallampati: II  TM Distance: >3 FB Neck ROM: Full    Dental no notable dental hx.    Pulmonary pneumonia, former smoker   Pulmonary exam normal breath sounds clear to auscultation       Cardiovascular hypertension, (-) angina (-) Past MI Normal cardiovascular exam Rhythm:Regular Rate:Normal     Neuro/Psych  Headaches, neg Seizures PSYCHIATRIC DISORDERS Anxiety        GI/Hepatic Neg liver ROS, PUD,GERD  ,,  Endo/Other  diabetes, Type 2    Renal/GU negative Renal ROS  negative genitourinary   Musculoskeletal  (+) Arthritis ,    Abdominal   Peds negative pediatric ROS (+)  Hematology negative hematology ROS (+)   Anesthesia Other Findings   Reproductive/Obstetrics negative OB ROS                              Anesthesia Physical Anesthesia Plan  ASA: 3  Anesthesia Plan: General and Regional   Post-op Pain Management: Regional block*   Induction: Intravenous  PONV Risk Score and Plan: 4 or greater and Ondansetron , Dexamethasone  and Treatment may vary due to age or medical condition  Airway Management Planned: Oral ETT  Additional Equipment: None  Intra-op Plan:   Post-operative Plan: Extubation in OR  Informed Consent: I have reviewed the patients History and Physical, chart, labs and discussed the procedure including the risks, benefits and alternatives for the proposed anesthesia with the patient or authorized representative who has indicated his/her understanding and acceptance.     Dental advisory given  Plan Discussed with:  CRNA  Anesthesia Plan Comments: (See PAT note from 1/16  Son with MH episode following abdominal surgery. No formal testing done this far. Patient thinks she was exposed to volatile anesthetics years ago for shoulder surgery, however, more recently has had all non triggering anesthetics.    Veronica Galvan is a 69 yo female with PMH of former smoking, HTN, CAD (by imaging), GERD, PUD, T2DM (A1c 7.1), anxiety, arthritis, PONV.   Prior anesthesia complications includes family hx of malignant hyperthermia. Per notes her son has had MH like reaction. Has not been formally tested due to expense of test. Daughter has had surgery without issue. Patient reports she has never had any issues and has not been tested. Last surgery on 11/10/22 and per notes MH precautions used.    Patient evaluated by Cardiology on 02/15/24 due to HTN and aortic atherosclerosis. Patient denied any symptoms and is active. She was advised to continue medical therapy and CT calcium  score was ordered which showed calcium  score was 86%ile. She was advised to increase her statin dose. Cardiac clearance addressed in 03/07/24 telephone note:   Chart reviewed as part of pre-operative protocol coverage. Patient was recently seen by Dr. Pietro on 02/15/2024. Per Dr. Pietro, patient is okay to proceed with surgery.   LD Synjardy : 1/24)         Anesthesia Quick Evaluation  "

## 2024-05-29 ENCOUNTER — Encounter (HOSPITAL_COMMUNITY)
Admission: RE | Disposition: A | Discharge status: Home / Self Care | Payer: Self-pay | Source: Ambulatory Visit | Attending: Orthopaedic Surgery

## 2024-05-29 ENCOUNTER — Observation Stay (HOSPITAL_COMMUNITY)
Admission: RE | Admit: 2024-05-29 | Discharge: 2024-05-29 | Disposition: A | Discharge status: Home / Self Care | Source: Ambulatory Visit | Attending: Orthopaedic Surgery | Admitting: Orthopaedic Surgery

## 2024-05-29 ENCOUNTER — Encounter (HOSPITAL_COMMUNITY): Payer: Self-pay | Admitting: Orthopaedic Surgery

## 2024-05-29 ENCOUNTER — Ambulatory Visit (HOSPITAL_COMMUNITY): Payer: Self-pay | Admitting: Medical

## 2024-05-29 ENCOUNTER — Other Ambulatory Visit: Payer: Self-pay

## 2024-05-29 ENCOUNTER — Inpatient Hospital Stay (HOSPITAL_COMMUNITY)

## 2024-05-29 ENCOUNTER — Ambulatory Visit (HOSPITAL_COMMUNITY): Admitting: Certified Registered"

## 2024-05-29 DIAGNOSIS — I10 Essential (primary) hypertension: Secondary | ICD-10-CM

## 2024-05-29 DIAGNOSIS — Z79899 Other long term (current) drug therapy: Secondary | ICD-10-CM | POA: Diagnosis not present

## 2024-05-29 DIAGNOSIS — E119 Type 2 diabetes mellitus without complications: Secondary | ICD-10-CM | POA: Diagnosis not present

## 2024-05-29 DIAGNOSIS — T84098A Other mechanical complication of other internal joint prosthesis, initial encounter: Secondary | ICD-10-CM | POA: Diagnosis present

## 2024-05-29 DIAGNOSIS — Z7984 Long term (current) use of oral hypoglycemic drugs: Secondary | ICD-10-CM | POA: Insufficient documentation

## 2024-05-29 DIAGNOSIS — F419 Anxiety disorder, unspecified: Secondary | ICD-10-CM | POA: Diagnosis not present

## 2024-05-29 DIAGNOSIS — Z96611 Presence of right artificial shoulder joint: Principal | ICD-10-CM | POA: Insufficient documentation

## 2024-05-29 DIAGNOSIS — Y792 Prosthetic and other implants, materials and accessory orthopedic devices associated with adverse incidents: Secondary | ICD-10-CM | POA: Diagnosis not present

## 2024-05-29 DIAGNOSIS — Z87891 Personal history of nicotine dependence: Secondary | ICD-10-CM | POA: Diagnosis not present

## 2024-05-29 LAB — GLUCOSE, CAPILLARY
Glucose-Capillary: 123 mg/dL — ABNORMAL HIGH (ref 70–99)
Glucose-Capillary: 149 mg/dL — ABNORMAL HIGH (ref 70–99)
Glucose-Capillary: 161 mg/dL — ABNORMAL HIGH (ref 70–99)

## 2024-05-29 LAB — ABO/RH: ABO/RH(D): O POS

## 2024-05-29 MED ORDER — ONDANSETRON HCL 4 MG/2ML IJ SOLN
INTRAMUSCULAR | Status: DC | PRN
  Administered 2024-05-29: 4 mg via INTRAVENOUS

## 2024-05-29 MED ORDER — 0.9 % SODIUM CHLORIDE (POUR BTL) OPTIME
TOPICAL | Status: DC | PRN
  Administered 2024-05-29: 1000 mL

## 2024-05-29 MED ORDER — BUPIVACAINE HCL (PF) 0.5 % IJ SOLN
INTRAMUSCULAR | Status: DC | PRN
  Administered 2024-05-29: 10 mL via PERINEURAL

## 2024-05-29 MED ORDER — ACETAMINOPHEN 500 MG PO TABS: 1000.0000 mg | ORAL_TABLET | Freq: Three times a day (TID) | ORAL | 0 refills | Status: AC

## 2024-05-29 MED ORDER — LIDOCAINE 2% (20 MG/ML) 5 ML SYRINGE
INTRAMUSCULAR | Status: DC | PRN
  Administered 2024-05-29: 60 mg via INTRAVENOUS

## 2024-05-29 MED ORDER — ORAL CARE MOUTH RINSE: 15.0000 mL | Freq: Once | OROMUCOSAL | Status: AC

## 2024-05-29 MED ORDER — LACTATED RINGERS IV SOLN: INTRAVENOUS | Status: DC

## 2024-05-29 MED ORDER — VANCOMYCIN HCL 1000 MG IV SOLR
INTRAVENOUS | Status: AC
  Filled 2024-05-29: qty 20

## 2024-05-29 MED ORDER — AMOXICILLIN 500 MG PO TABS: 500.0000 mg | ORAL_TABLET | Freq: Two times a day (BID) | ORAL | 0 refills | Status: AC

## 2024-05-29 MED ORDER — INSULIN ASPART 100 UNIT/ML IJ SOLN
0.0000 [IU] | INTRAMUSCULAR | Status: DC | PRN
  Administered 2024-05-29: 2 [IU] via SUBCUTANEOUS
  Filled 2024-05-29: qty 2

## 2024-05-29 MED ORDER — SODIUM CHLORIDE 0.9 % IR SOLN
Status: DC | PRN
  Administered 2024-05-29: 1000 mL

## 2024-05-29 MED ORDER — CELECOXIB 200 MG PO CAPS: 200.0000 mg | ORAL_CAPSULE | Freq: Two times a day (BID) | ORAL | 0 refills | Status: AC

## 2024-05-29 MED ORDER — CEFAZOLIN SODIUM-DEXTROSE 2-4 GM/100ML-% IV SOLN: 2.0000 g | INTRAVENOUS | Status: DC

## 2024-05-29 MED ORDER — ACETAMINOPHEN 10 MG/ML IV SOLN: 1000.0000 mg | Freq: Once | INTRAVENOUS | Status: DC | PRN

## 2024-05-29 MED ORDER — PROMETHAZINE HCL 12.5 MG PO TABS: 12.5000 mg | ORAL_TABLET | Freq: Four times a day (QID) | ORAL | 0 refills | Status: AC | PRN

## 2024-05-29 MED ORDER — ACETAMINOPHEN 500 MG PO TABS
1000.0000 mg | ORAL_TABLET | Freq: Once | ORAL | Status: AC
  Administered 2024-05-29: 1000 mg via ORAL
  Filled 2024-05-29: qty 2

## 2024-05-29 MED ORDER — ROCURONIUM BROMIDE 10 MG/ML (PF) SYRINGE
PREFILLED_SYRINGE | INTRAVENOUS | Status: AC
  Filled 2024-05-29: qty 10

## 2024-05-29 MED ORDER — TRANEXAMIC ACID-NACL 1000-0.7 MG/100ML-% IV SOLN
1000.0000 mg | INTRAVENOUS | Status: AC
  Administered 2024-05-29: 1000 mg via INTRAVENOUS
  Filled 2024-05-29: qty 100

## 2024-05-29 MED ORDER — FENTANYL CITRATE (PF) 50 MCG/ML IJ SOSY: 25.0000 ug | PREFILLED_SYRINGE | INTRAMUSCULAR | Status: DC | PRN

## 2024-05-29 MED ORDER — ROCURONIUM 10MG/ML (10ML) SYRINGE FOR MEDFUSION PUMP - OPTIME
INTRAVENOUS | Status: DC | PRN
  Administered 2024-05-29: 50 mg via INTRAVENOUS

## 2024-05-29 MED ORDER — DEXAMETHASONE SOD PHOSPHATE PF 10 MG/ML IJ SOLN
INTRAMUSCULAR | Status: DC | PRN
  Administered 2024-05-29: 10 mg via INTRAVENOUS

## 2024-05-29 MED ORDER — VANCOMYCIN HCL 1 G IV SOLR
INTRAVENOUS | Status: DC | PRN
  Administered 2024-05-29: 1000 mg via TOPICAL

## 2024-05-29 MED ORDER — PROPOFOL 10 MG/ML IV BOLUS
INTRAVENOUS | Status: DC | PRN
  Administered 2024-05-29: 100 mg via INTRAVENOUS

## 2024-05-29 MED ORDER — PHENYLEPHRINE HCL (PRESSORS) 10 MG/ML IV SOLN
INTRAVENOUS | Status: DC | PRN
  Administered 2024-05-29: 80 ug via INTRAVENOUS
  Administered 2024-05-29: 160 ug via INTRAVENOUS

## 2024-05-29 MED ORDER — FENTANYL CITRATE (PF) 100 MCG/2ML IJ SOLN
INTRAMUSCULAR | Status: AC
  Filled 2024-05-29: qty 2

## 2024-05-29 MED ORDER — CHLORHEXIDINE GLUCONATE 0.12 % MT SOLN
15.0000 mL | Freq: Once | OROMUCOSAL | Status: AC
  Administered 2024-05-29: 15 mL via OROMUCOSAL

## 2024-05-29 MED ORDER — SUGAMMADEX SODIUM 200 MG/2ML IV SOLN
INTRAVENOUS | Status: DC | PRN
  Administered 2024-05-29: 200 mg via INTRAVENOUS

## 2024-05-29 MED ORDER — PROPOFOL 500 MG/50ML IV EMUL
INTRAVENOUS | Status: DC | PRN
  Administered 2024-05-29: 125 ug/kg/min via INTRAVENOUS

## 2024-05-29 MED ORDER — DROPERIDOL 2.5 MG/ML IJ SOLN: 0.6250 mg | Freq: Once | INTRAMUSCULAR | Status: DC | PRN

## 2024-05-29 MED ORDER — FENTANYL CITRATE (PF) 50 MCG/ML IJ SOSY
50.0000 ug | PREFILLED_SYRINGE | INTRAMUSCULAR | Status: DC
  Administered 2024-05-29: 50 ug via INTRAVENOUS
  Filled 2024-05-29: qty 1

## 2024-05-29 MED ORDER — ASPIRIN 81 MG PO CHEW: 81.0000 mg | CHEWABLE_TABLET | Freq: Two times a day (BID) | ORAL | 0 refills | Status: AC

## 2024-05-29 MED ORDER — ONDANSETRON HCL 4 MG/2ML IJ SOLN
INTRAMUSCULAR | Status: AC
  Filled 2024-05-29: qty 2

## 2024-05-29 MED ORDER — PROPOFOL 10 MG/ML IV BOLUS
INTRAVENOUS | Status: AC
  Filled 2024-05-29: qty 20

## 2024-05-29 MED ORDER — PHENYLEPHRINE 80 MCG/ML (10ML) SYRINGE FOR IV PUSH (FOR BLOOD PRESSURE SUPPORT)
PREFILLED_SYRINGE | INTRAVENOUS | Status: AC
  Filled 2024-05-29: qty 20

## 2024-05-29 MED ORDER — CEFAZOLIN SODIUM-DEXTROSE 2-4 GM/100ML-% IV SOLN
2.0000 g | INTRAVENOUS | Status: AC
  Administered 2024-05-29: 2 g via INTRAVENOUS
  Filled 2024-05-29: qty 100

## 2024-05-29 MED ORDER — PRONTOSAN WOUND IRRIGATION OPTIME
TOPICAL | Status: DC | PRN
  Administered 2024-05-29: 1 via TOPICAL

## 2024-05-29 MED ORDER — FENTANYL CITRATE (PF) 250 MCG/5ML IJ SOLN
INTRAMUSCULAR | Status: DC | PRN
  Administered 2024-05-29: 100 ug via INTRAVENOUS

## 2024-05-29 MED ORDER — BUPIVACAINE LIPOSOME 1.3 % IJ SUSP
INTRAMUSCULAR | Status: DC | PRN
  Administered 2024-05-29: 10 mL

## 2024-05-29 MED ORDER — SUGAMMADEX SODIUM 200 MG/2ML IV SOLN
INTRAVENOUS | Status: AC
  Filled 2024-05-29: qty 2

## 2024-05-29 MED ORDER — TRAMADOL HCL 50 MG PO TABS: 50.0000 mg | ORAL_TABLET | Freq: Four times a day (QID) | ORAL | 0 refills | Status: AC | PRN

## 2024-05-29 MED ORDER — LIDOCAINE HCL (PF) 2 % IJ SOLN
INTRAMUSCULAR | Status: AC
  Filled 2024-05-29: qty 5

## 2024-05-29 MED ORDER — DEXAMETHASONE SOD PHOSPHATE PF 10 MG/ML IJ SOLN
INTRAMUSCULAR | Status: AC
  Filled 2024-05-29: qty 1

## 2024-05-29 NOTE — Anesthesia Postprocedure Evaluation (Signed)
"   Anesthesia Post Note  Patient: ANNALIA METZGER  Procedure(s) Performed: REVISION, REVERSE TOTAL ARTHROPLASTY, SHOULDER, SYNOVECTOMY (Right: Shoulder)     Patient location during evaluation: PACU Anesthesia Type: Regional and General Level of consciousness: awake and alert Pain management: pain level controlled Vital Signs Assessment: post-procedure vital signs reviewed and stable Respiratory status: spontaneous breathing, nonlabored ventilation, respiratory function stable and patient connected to nasal cannula oxygen Cardiovascular status: blood pressure returned to baseline and stable Postop Assessment: no apparent nausea or vomiting Anesthetic complications: no   No notable events documented.  Last Vitals:  Vitals:   05/29/24 1833 05/29/24 1845  BP:  (!) 160/69  Pulse: 88   Resp: 18 17  Temp:  36.4 C  SpO2: 98% 99%    Last Pain:  Vitals:   05/29/24 1845  TempSrc: Oral  PainSc: 0-No pain                 Thom JONELLE Peoples      "

## 2024-05-29 NOTE — Care Management Obs Status (Signed)
 MEDICARE OBSERVATION STATUS NOTIFICATION   Patient Details  Name: Veronica Galvan MRN: [id number] Date of Birth: [birth year]   Medicare Observation Status Notification Given:  Yes    Merilee LOISE Batty, RN 05/29/2024, 6:45 PM

## 2024-05-29 NOTE — Discharge Instructions (Addendum)
 Bonner Hair MD, MPH Aleck Stalling, PA-C [facility] [address], [address] [phone] (tel)   [phone] (fax)   POST-OPERATIVE INSTRUCTIONS - TOTAL SHOULDER REPLACEMENT    WOUND CARE You may leave the operative dressing in place until your follow-up appointment. KEEP THE INCISIONS CLEAN AND DRY. There may be a small amount of fluid/bleeding leaking at the surgical site. This is normal after surgery.  If it fills with liquid or blood please call us  immediately to change it for you. Use the provided ice machine or Ice packs as often as possible for the first 3-4 days, then as needed for pain relief.   Keep a layer of cloth or a shirt between your skin and the cooling unit to prevent frost bite as it can get very cold.  SHOWERING: - You may shower on Post-Op Day #2.  - The dressing is water  resistant but do not scrub it as it may start to peel up.   - You may remove the sling for showering - Gently pat the area dry.  - Do not soak the shoulder in water .  - Do not go swimming in the pool or ocean until your incision has completely healed (about 4-6 weeks after surgery) - KEEP THE INCISIONS CLEAN AND DRY.  EXERCISES Wear the sling at all times  You may remove the sling for showering, but keep the arm across the chest or in a secondary sling.    Accidental/Purposeful External Rotation and shoulder flexion (reaching behind you) is to be avoided at all costs for the first month. It is ok to come out of your sling if your are sitting and have assistance for eating.   Do not lift anything heavier than 1 pound until we discuss it further in clinic.  It is normal for your fingers/hand to become more swollen after surgery and discolored from bruising.   This will resolve over the first few weeks usually after surgery. Please continue to ambulate and do not stay sitting or lying for too long.  Perform foot and wrist pumps to assist in circulation.  PHYSICAL  THERAPY - no therapy for 4 weeks  REGIONAL ANESTHESIA (NERVE BLOCKS) The anesthesia team may have performed a nerve block for you this is a great tool used to minimize pain.   The block may start wearing off overnight (between 8-24 hours postop) When the block wears off, your pain may go from nearly zero to the pain you would have had postop without the block. This is an abrupt transition but nothing dangerous is happening.   This can be a challenging period but utilize your as needed pain medications to try and manage this period. We suggest you use the pain medication the first night prior to going to bed, to ease this transition.  You may take an extra dose of narcotic when this happens if needed   POST-OP MEDICATIONS- Multimodal approach to pain control In general your pain will be controlled with a combination of substances.  Prescriptions unless otherwise discussed are electronically sent to your pharmacy.  This is a carefully made plan we use to minimize narcotic use.     Celebrex  - Anti-inflammatory medication taken on a scheduled basis Acetaminophen  - Non-narcotic pain medicine taken on a scheduled basis  Tramadol  - This is a strong narcotic, to be used only on an as needed basis for SEVERE pain. Aspirin  81mg  - This medicine is used to minimize the risk of blood clots after surgery.  Phenergan  -  take as needed for nausea Amoxicillin  - this is an antibiotic, take as instructed   FOLLOW-UP If you develop a Fever (>101.5), Redness or Drainage from the surgical incision site, please call our office to arrange for an evaluation. Please call the office to schedule a follow-up appointment for a wound check, 7-10 days post-operatively.  IF YOU HAVE ANY QUESTIONS, PLEASE FEEL FREE TO CALL OUR OFFICE.  HELPFUL INFORMATION  Your arm will be in a sling following surgery. You will be in this sling for the next 4 weeks.   You may be more comfortable sleeping in a semi-seated position  the first few nights following surgery.  Keep a pillow propped under the elbow and forearm for comfort.  If you have a recliner type of chair it might be beneficial.  If not that is fine too, but it would be helpful to sleep propped up with pillows behind your operated shoulder as well under your elbow and forearm.  This will reduce pulling on the suture lines.  When dressing, put your operative arm in the sleeve first.  When getting undressed, take your operative arm out last.  Loose fitting, button-down shirts are recommended.  In most states it is against the law to drive while your arm is in a sling. And certainly against the law to drive while taking narcotics.  You may return to work/school in the next couple of days when you feel up to it. Desk work and typing in the sling is fine.  We suggest you use the pain medication the first night prior to going to bed, in order to ease any pain when the anesthesia wears off. You should avoid taking pain medications on an empty stomach as it will make you nauseous.  You should wean off your narcotic medicines as soon as you are able.     Most patients will be off narcotics before their first postop appointment.   Do not drink alcoholic beverages or take illicit drugs when taking pain medications.  Pain medication may make you constipated.  Below are a few solutions to try in this order: Decrease the amount of pain medication if you arent having pain. Drink lots of decaffeinated fluids. Drink prune juice and/or each dried prunes  If the first 3 dont work start with additional solutions Take Colace - an over-the-counter stool softener Take Senokot - an over-the-counter laxative Take Miralax  - a stronger over-the-counter laxative   Dental Antibiotics:  We require dental prophylaxis for 2 years after a shoulder replacement  Contact your surgeon for an antibiotic prescription, prior to your dental procedure.   For more information including  helpful videos and documents visit our website:   [email or web address]

## 2024-05-29 NOTE — Anesthesia Procedure Notes (Signed)
 Procedure Name: Intubation Date/Time: 05/29/2024 4:23 PM  Performed by: Nanci Riis, CRNAPre-anesthesia Checklist: Patient identified, Emergency Drugs available, Suction available, Patient being monitored and Timeout performed Patient Re-evaluated:Patient Re-evaluated prior to induction Oxygen Delivery Method: Circle system utilized Preoxygenation: Pre-oxygenation with 100% oxygen Induction Type: IV induction Ventilation: Mask ventilation without difficulty Laryngoscope Size: Miller and 3 Grade View: Grade II Tube type: Oral Number of attempts: 1 Airway Equipment and Method: Stylet Placement Confirmation: ETT inserted through vocal cords under direct vision, positive ETCO2 and breath sounds checked- equal and bilateral Secured at: 21 cm Tube secured with: Tape Dental Injury: Teeth and Oropharynx as per pre-operative assessment

## 2024-05-29 NOTE — Transfer of Care (Signed)
 Immediate Anesthesia Transfer of Care Note  Patient: Veronica Galvan  Procedure(s) Performed: REVISION, REVERSE TOTAL ARTHROPLASTY, SHOULDER, SYNOVECTOMY (Right: Shoulder)  Patient Location: PACU  Anesthesia Type:General  Level of Consciousness: awake, alert , and oriented  Airway & Oxygen Therapy: Patient Spontanous Breathing  Post-op Assessment: Report given to RN and Post -op Vital signs reviewed and stable  Post vital signs: Reviewed and stable  Last Vitals:  Vitals Value Taken Time  BP 161/79 05/29/24 18:09  Temp    Pulse 88 05/29/24 18:11  Resp 17 05/29/24 18:11  SpO2 100 % 05/29/24 18:11  Vitals shown include unfiled device data.  Last Pain:  Vitals:   05/29/24 1354  TempSrc:   PainSc: 4          Complications: No notable events documented.

## 2024-05-29 NOTE — Anesthesia Procedure Notes (Signed)
 Anesthesia Regional Block: Interscalene brachial plexus block   Pre-Anesthetic Checklist: , timeout performed,  Correct Patient, Correct Site, Correct Laterality,  Correct Procedure, Correct Position, site marked,  Risks and benefits discussed,  Surgical consent,  Pre-op evaluation,  At surgeon's request and post-op pain management  Laterality: Right  Prep: chloraprep       Needles:  Injection technique: Single-shot  Needle Type: Echogenic Stimulator Needle     Needle Length: 9cm  Needle Gauge: 21     Additional Needles:   Procedures:,,,, ultrasound used (permanent image in chart),,    Narrative:  Start time: 05/29/2024 3:40 PM End time: 05/29/2024 3:45 PM Injection made incrementally with aspirations every 5 mL.  Performed by: Personally  Anesthesiologist: Erma Thom SAUNDERS, MD  Additional Notes: Discussed risks and benefits of the nerve block in detail, including but not limited vascular injury, permanent nerve damage and infection.   Patient tolerated the procedure well. Local anesthetic introduced in an incremental fashion under minimal resistance after negative aspirations. No paresthesias were elicited. After completion of the procedure, no acute issues were identified and patient continued to be monitored by RN.

## 2024-05-29 NOTE — Care Management CC44 (Signed)
"         Condition Code 44 Documentation Completed  Patient Details  Name: Veronica Galvan MRN: [id number] Date of Birth: 1955/06/26   Condition Code 44 given:  Yes Patient signature on Condition Code 44 notice:  Yes (Verbal via phone) Documentation of 2 MD's agreement:  Yes Code 44 added to claim:  Yes    Merilee LOISE Batty, RN 05/29/2024, 6:45 PM  "

## 2024-05-29 NOTE — Op Note (Signed)
 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  Patient's stem and baseplate were well-fixed.  There was some fibrinous tissue around the implants however there is no gross purulence.  Based on this there is a moderate risk of infection.  I took 3 samples to hold for culture for 2 weeks.  Will put her on suppressive antibiotics.  The primary issue I think with the previous construct was loss of fixation of the tuberosities which were clearly wrapped around the glenosphere and likely impinging as the patient went through range of motion.  That said with well-fixed implants and a moderate risk of infection I did not feel that removing the implants was in the patient's best interest as she had a extremely long humeral stem in and would likely needed an osteotomy to remove it.  Post-operative plan: The patient will be NWB in sling.  The patient will be admitted as it is indicated by her CPT code with consideration for same-day discharge if appropriate from PACU.  DVT prophylaxis Aspirin  81 mg twice daily for 6 weeks.  Pain control with PRN pain medication preferring oral medicines.  Follow up plan will be scheduled in approximately 7 days for incision check and XR.  Physical therapy to start after 4 weeks.  Implants: Arthrex 36 cup, +6 retentive polyethylene, 36 center glenosphere, 34 screw.  Post-Op Diagnosis: Same Surgeons:Primary: Cristy Bonner DASEN, MD Assistants:Caroline McBane, PA-C Location: [facility] ROOM 09 Anesthesia: General with Exparel  Interscalene Antibiotics: Ancef  2g preop, Vancomycin  1000mg  locally Tourniquet time: None Estimated Blood Loss: 100 Complications:  None Specimens: 3 for culture, hold for 2 weeks to rule out C acnes Implants: Implant Name Type Inv. Item Serial No. Manufacturer Lot No. LRB No. Used Action  SCREW PERI LOCK 5.5X32 - [id number] Screw SCREW PERI LOCK 5.5X32  [employer] [id number] Right 1 Implanted  SCREW PERI LOCK 5.5X36 - [id number] Screw SCREW PERI LOCK 5.5X36  [employer] [id number] Right 1 Explanted  GLENOSPHERE 36 +4 LAT/24 - [id number] Joint GLENOSPHERE 36 +4 LAT/24  [employer] [id number] Right 1 Explanted  CUP SUT UNIV REVERS 36 NEUTRAL - [id number] Cup CUP SUT UNIV REVERS 36 NEUTRAL  [employer] [id number] Right 1 Explanted  GLENOSPHERE 36 +4 LAT/24 - [id number] Joint GLENOSPHERE 36 +4 LAT/24  [employer] [id number] Right 1 Implanted  CUP SUT UNIV REVERS 36 NEUTRAL - [id number] Cup CUP SUT UNIV REVERS 36 NEUTRAL  [employer] [id number] Right 1 Implanted  INSERT HUMERAL UNI REVERS 36 6 - [id number] Insert INSERT HUMERAL UNI REVERS 36 6  [employer] [id number] Right 1 Implanted  LINER HUMERAL 36 +3MM SM - [id number] Shoulder LINER HUMERAL 36 +3MM SM  [employer] [id number] Right 1 Explanted    Indications for Surgery:   Veronica Galvan is a 69 y.o. female with previous reverse for fracture performed by another provider with initial good progress and subsequent loss of range of motion..  Benefits and risks of operative and nonoperative management were discussed prior to surgery with patient/guardian(s) and informed consent form was completed.  Infection and need for further surgery were discussed as was prosthetic stability and cuff issues.  We additionally specifically discussed risks of axillary nerve injury, infection, periprosthetic fracture, continued pain and longevity of  implants prior to beginning procedure.      Procedure:   The patient was identified in the preoperative holding area where the surgical site was marked. Block placed by anesthesia with exparel .  The patient was taken to the OR where a procedural timeout was called  and the above noted anesthesia was induced.  The patient was positioned beachchair on allen table with spider arm positioner.  Preoperative antibiotics were dosed.  The patient's right shoulder was prepped and draped in the usual sterile fashion.  A second preoperative timeout was called.       We under the deltopectoral incision using the patient's previous incision.  Went through skin sharp achieving hemostasis we progressed.  We identified the previous deltopectoral scar and opened up bluntly.  We kept a cephalic vein lateral.  We then were able to expose the clavipectoral fascia.  We opened in line with the fibers.  I then was able to place blunt retractors and peel away subdeltoid scarring away from the humerus.  Scarring was quite significant.  I was then able to expose the implants.  We released the capsule and the remnant of the subscapularis from the anterior portion.  The tuberosities superiorly and anteriorly had all migrated proximally.  These were likely impinging as the patient went through range of motion.  I was able to eventually dislocate the joint which was quite stable.  I was able to remove the poly in the cup and checked the stem as it was well-fixed I did not feel was in the best interest the patient to remove it.  We thus retained this portion.  We turned our attention to glenosphere.  Remove the glenosphere without issue.  Synovectomy was performed on the humerus and the glenoid and multiple specimens were sent.  I then proceeded to clear around the old tray.  The tray appeared to be well-fixed and the screws all had good purchase.  I did change the inferior most screw as I checked it and wanted to ensure that it was fully tight.  This was transition to another 32 mm screw.  Term attention to the posterior aspect of the shoulder.  I was able to palpate the tuberosities which were proximal and posterior.  With removal of these implants was able to remove the tuberosities remnants  as they were clearly not able to be repaired.  They were unable to be mobilized.  Once the impingement of bone was removed the shoulder moves well.  We placed our new cup and trialed and selected the above implant.  We did repair the remnant of the anterior capsule and subscapularis to the suture cup.  We put in Prontosan by technique and irrigated with 4 L normal saline.  Closed incision multilayer fashion fashion with nylon suture.  Sterile dressing and sling were placed.  Patient awoken and taken to PACU in stable condition.  We then were able to peripherally ream and placed a 36 glenosphere that was new.  Aleck Stalling, PA-C, present and scrubbed throughout the case, critical for completion in a timely fashion, and for retraction, instrumentation, closure.

## 2024-05-29 NOTE — Interval H&P Note (Signed)
 All questions answered, patient wants to proceed with procedure. ? ?

## 2024-05-30 ENCOUNTER — Encounter (HOSPITAL_COMMUNITY): Payer: Self-pay | Admitting: Orthopaedic Surgery

## 2024-05-30 ENCOUNTER — Encounter: Payer: Self-pay | Admitting: Family Medicine

## 2024-05-30 DIAGNOSIS — G8929 Other chronic pain: Secondary | ICD-10-CM

## 2024-05-30 NOTE — Discharge Summary (Signed)
 "  Patient ID: Veronica Galvan MRN: [id number] DOB/AGE: [birth year] 70 y.o.  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   Discharge Diagnoses:  Principal Problem:   Status post reverse total arthroplasty of right shoulder   Past Medical History:  Diagnosis Date   Allergy     Anxiety    Aortic atherosclerosis    Arthritis    hands   Cataract    Diabetes mellitus without complication (HCC)    type 2   Family history of adverse reaction to anesthesia    son has malignant hyperthermia, daughter does not daughter recently had c section without problems   GERD (gastroesophageal reflux disease)    Headache    sinus   Hyperlipidemia    Hypertension    Pneumonia    PONV (postoperative nausea and vomiting)    nausea only   Ulcer, stomach peptic yrs ago     Procedures Performed:  Right revision reverse total Shoulder Arthroplasty [phone] Right open synovectomy and synovial biopsy [id number]  Discharged Condition: stable  Hospital Course: Patient brought in as an outpatient for surgery.  Tolerated procedure well.  Was kept for monitoring in PACU for pain control and medical monitoring postop and was found to be stable for DC home the evening of surgery.  Patient was instructed on specific activity restrictions and all questions were answered.  Opiates Today (MME): Today's  total administered Morphine Milligram Equivalents: 0 Opiates Yesterday (MME): Yesterday's total administered Morphine Milligram Equivalents: 45 Opiates Used in the last two days:  Inpatient Morphine Milligram Equivalents Per Day 1/28 - 1/28   Values displayed are in units of MME/Day    Order Start / End Date Yesterday    fentaNYL  (SUBLIMAZE ) injection 50-100 mcg 1/28 - 1/29 15 of 15-30    fentaNYL  (SUBLIMAZE ) injection 25-50 mcg 1/28 - 1/29 0 of 45-90    fentaNYL  citrate (PF) (SUBLIMAZE ) injection 1/28 - 1/28 *30 of 30    Daily  Totals  * 45 of 90-150  *One-Step medication     Consults: None  Significant Diagnostic Studies: Cultures  Treatments: Surgery  Disposition: Discharge disposition: 01-Home or Self Care       Discharge Instructions     Call MD for:  redness, tenderness, or signs of infection (pain, swelling, redness, odor or green/yellow discharge around incision site)   Complete by: As directed    Call MD for:  severe uncontrolled pain   Complete by: As directed    Call MD for:  temperature >100.4   Complete by: As directed    Diet - low sodium heart healthy   Complete by: As directed       Allergies as of 05/29/2024       Reactions   Codeine Hives, Itching   Benzonatate Rash   Epinephrine Palpitations        Medication List     STOP taking these medications    acetaminophen  650 MG CR tablet Commonly known as: TYLENOL  Replaced by: acetaminophen  500 MG tablet   aspirin  EC 81 MG tablet Replaced by: aspirin  81 MG chewable tablet       TAKE these medications    acetaminophen  500 MG tablet Commonly known as: TYLENOL  Take 2 tablets (1,000 mg total) by mouth every 8 (eight) hours for 14 days. Replaces: acetaminophen  650 MG CR tablet   ALPRAZolam  0.5 MG tablet Commonly known as: Xanax  Take 1 tablet (0.5 mg total) by mouth  at bedtime as needed for anxiety.   amoxicillin  500 MG tablet Commonly known as: AMOXIL  Take 1 tablet (500 mg total) by mouth 2 (two) times daily for 14 days.   aspirin  81 MG chewable tablet Commonly known as: Aspirin  Childrens Chew 1 tablet (81 mg total) by mouth 2 (two) times daily. For 6 weeks for DVT prophylaxis after surgery Replaces: aspirin  EC 81 MG tablet   atorvastatin  40 MG tablet Commonly known as: LIPITOR Take 1 tablet (40 mg total) by mouth every evening.   Blood Glucose Monitoring Suppl Devi 1 each by Does not apply route in the morning, at noon, and at bedtime. May substitute to any manufacturer covered by patient's insurance.    CALCIUM  600+D3 PO Take 1 tablet by mouth every morning.   calcium  carbonate 500 MG chewable tablet Commonly known as: TUMS - dosed in mg elemental calcium  Chew 2 tablets by mouth daily as needed for indigestion or heartburn.   celecoxib  200 MG capsule Commonly known as: CeleBREX  Take 1 capsule (200 mg total) by mouth 2 (two) times daily. What changed: when to take this   Coenzyme Q10 300 MG Caps Take 1 capsule by mouth every morning.   diclofenac Sodium 1 % Gel Commonly known as: VOLTAREN Apply 1 Application topically 2 (two) times daily.   Fish Oil 1000 MG Caps Take 2,000 mg by mouth daily.   fluticasone  50 MCG/ACT nasal spray Commonly known as: FLONASE  Place 2 sprays into both nostrils daily.   glucose blood test strip Use as instructed   levocetirizine 5 MG tablet Commonly known as: XYZAL  Take 1 tablet (5 mg total) by mouth every evening.   lisinopril  5 MG tablet Commonly known as: ZESTRIL  Take 1 tablet (5 mg total) by mouth 2 (two) times daily.   LUTEIN-ZEAXANTHIN PO Take 1 tablet by mouth every morning.   MAGNESIUM GLYCINATE PO Take 2 capsules by mouth daily. 240mg    multivitamin with minerals tablet Take 1 tablet by mouth every morning.   omeprazole  20 MG capsule Commonly known as: PRILOSEC Take 1 capsule (20 mg total) by mouth every morning.   PROBIOTIC PO Take 2 each by mouth daily. gummies   promethazine  12.5 MG tablet Commonly known as: PHENERGAN  Take 1 tablet (12.5 mg total) by mouth every 6 (six) hours as needed for nausea or vomiting.   Refresh Tears 0.5 % Soln Generic drug: carboxymethylcellulose Place 2 drops into both eyes 2 (two) times daily.   Restasis 0.05 % ophthalmic emulsion Generic drug: cycloSPORINE Place 1 drop into both eyes 2 (two) times daily.   Semaglutide  (2 MG/DOSE) 8 MG/3ML Sopn Inject 2 mg as directed once a week.   sodium chloride  2 % ophthalmic solution Commonly known as: MURO 128 Place 2 drops into both eyes 2  (two) times daily.   Synjardy  XR 12.08-998 MG Tb24 Generic drug: Empagliflozin-metFORMIN HCl ER Take 1 tablet by mouth 2 (two) times daily.   traMADol  50 MG tablet Commonly known as: Ultram  Take 1 tablet (50 mg total) by mouth every 6 (six) hours as needed for severe pain (pain score 7-10).   TURMERIC PO Take 2,000 mg by mouth daily.   UNABLE TO FIND Take 1 tablet by mouth daily. Cinsulin supplement   vitamin C 1000 MG tablet Take 1,000 mg by mouth every morning.   VITAMIN D3 PO Take 50 mcg by mouth daily.   vitamin E 180 MG (400 UNITS) capsule Take 400 Units by mouth daily.  Aleck Stalling, PA-C      "

## 2024-06-03 ENCOUNTER — Ambulatory Visit: Admitting: Physical Therapy

## 2024-06-05 ENCOUNTER — Ambulatory Visit: Admitting: Radiation Oncology

## 2024-06-06 ENCOUNTER — Ambulatory Visit: Admitting: Physical Therapy

## 2024-06-07 LAB — AEROBIC/ANAEROBIC CULTURE W GRAM STAIN (SURGICAL/DEEP WOUND)
Culture: NO GROWTH
Culture: NO GROWTH
Culture: NO GROWTH
Gram Stain: NONE SEEN

## 2024-06-11 ENCOUNTER — Ambulatory Visit: Admitting: Physical Therapy

## 2024-06-13 ENCOUNTER — Ambulatory Visit: Admitting: Physical Therapy

## 2024-06-18 ENCOUNTER — Ambulatory Visit: Admitting: Family

## 2024-06-18 ENCOUNTER — Ambulatory Visit (HOSPITAL_BASED_OUTPATIENT_CLINIC_OR_DEPARTMENT_OTHER)

## 2024-06-18 ENCOUNTER — Ambulatory Visit: Admitting: Physical Therapy

## 2024-06-19 ENCOUNTER — Ambulatory Visit (HOSPITAL_BASED_OUTPATIENT_CLINIC_OR_DEPARTMENT_OTHER)

## 2024-06-20 ENCOUNTER — Ambulatory Visit: Admitting: Physical Therapy

## 2024-06-25 ENCOUNTER — Ambulatory Visit: Admitting: Radiation Oncology

## 2024-06-25 ENCOUNTER — Ambulatory Visit: Admitting: Physical Therapy

## 2024-06-27 ENCOUNTER — Ambulatory Visit

## 2024-07-02 ENCOUNTER — Ambulatory Visit

## 2024-07-04 ENCOUNTER — Ambulatory Visit

## 2024-07-09 ENCOUNTER — Ambulatory Visit

## 2024-07-10 ENCOUNTER — Ambulatory Visit: Admitting: Radiation Oncology

## 2024-07-11 ENCOUNTER — Ambulatory Visit

## 2024-07-16 ENCOUNTER — Ambulatory Visit: Admitting: Family Medicine

## 2024-07-23 ENCOUNTER — Ambulatory Visit: Admitting: Radiation Oncology

## 2024-07-25 ENCOUNTER — Ambulatory Visit

## 2024-07-30 ENCOUNTER — Ambulatory Visit

## 2024-08-01 ENCOUNTER — Ambulatory Visit

## 2024-08-06 ENCOUNTER — Ambulatory Visit

## 2024-08-08 ENCOUNTER — Ambulatory Visit

## 2024-09-18 ENCOUNTER — Ambulatory Visit
